# Patient Record
Sex: Male | Born: 1970 | Hispanic: No | Smoking: Former smoker
Health system: Southern US, Community
[De-identification: ages and names within clinical notes are randomized; demographics above are authoritative.]

## PROBLEM LIST (undated history)

## (undated) DIAGNOSIS — A150 Tuberculosis of lung: Secondary | ICD-10-CM

## (undated) DIAGNOSIS — K759 Inflammatory liver disease, unspecified: Secondary | ICD-10-CM

## (undated) DIAGNOSIS — A630 Anogenital (venereal) warts: Secondary | ICD-10-CM

## (undated) DIAGNOSIS — B20 Human immunodeficiency virus [HIV] disease: Secondary | ICD-10-CM

## (undated) DIAGNOSIS — R7881 Bacteremia: Secondary | ICD-10-CM

## (undated) DIAGNOSIS — B9561 Methicillin susceptible Staphylococcus aureus infection as the cause of diseases classified elsewhere: Secondary | ICD-10-CM

## (undated) DIAGNOSIS — J471 Bronchiectasis with (acute) exacerbation: Secondary | ICD-10-CM

## (undated) DIAGNOSIS — F32A Depression, unspecified: Secondary | ICD-10-CM

## (undated) DIAGNOSIS — B59 Pneumocystosis: Secondary | ICD-10-CM

## (undated) DIAGNOSIS — E119 Type 2 diabetes mellitus without complications: Secondary | ICD-10-CM

## (undated) DIAGNOSIS — R0600 Dyspnea, unspecified: Secondary | ICD-10-CM

## (undated) DIAGNOSIS — F329 Major depressive disorder, single episode, unspecified: Secondary | ICD-10-CM

## (undated) HISTORY — PX: FRACTURE SURGERY: SHX138

## (undated) HISTORY — DX: Anogenital (venereal) warts: A63.0

## (undated) HISTORY — DX: Tuberculosis of lung: A15.0

## (undated) HISTORY — DX: Human immunodeficiency virus (HIV) disease: B20

---

## 2013-09-18 ENCOUNTER — Encounter: Payer: Self-pay | Admitting: Family Medicine

## 2013-09-18 ENCOUNTER — Ambulatory Visit (INDEPENDENT_AMBULATORY_CARE_PROVIDER_SITE_OTHER): Payer: Medicaid Other | Admitting: Family Medicine

## 2013-09-18 VITALS — BP 110/74 | HR 105 | Temp 97.6°F | Ht 74.0 in | Wt 133.8 lb

## 2013-09-18 DIAGNOSIS — R059 Cough, unspecified: Secondary | ICD-10-CM

## 2013-09-18 DIAGNOSIS — B20 Human immunodeficiency virus [HIV] disease: Secondary | ICD-10-CM | POA: Insufficient documentation

## 2013-09-18 DIAGNOSIS — R05 Cough: Secondary | ICD-10-CM

## 2013-09-18 DIAGNOSIS — Z0289 Encounter for other administrative examinations: Secondary | ICD-10-CM | POA: Insufficient documentation

## 2013-09-18 MED ORDER — EFAVIRENZ-EMTRICITAB-TENOFOVIR 600-200-300 MG PO TABS: 1.0000 | ORAL_TABLET | Freq: Every day | ORAL | Status: DC

## 2013-09-18 NOTE — Assessment & Plan Note (Addendum)
Dr. Gwendolyn GrantWalden spoke with Dr. Algis LimingVanDam by phone, who recommended changing medication to Atripla. Will rx 30 day supply as pt is about to run out of his HIV medications. Referral entered to ID clinic for pt [facility]. Will check CD4 count and viral load (with reflex genotype) today.

## 2013-09-18 NOTE — Assessment & Plan Note (Signed)
Decreased lung sounds LLL in setting of productive cough. Would like to obtain CXR but pt does not have Medicaid set up yet. Afebrile at present. Will defer CXR to future visit. F/u in 3 weeks.

## 2013-09-18 NOTE — Progress Notes (Signed)
Patient ID: Anthony SchlatterJean Paul Philippe Parsons, male   DOB: 01/13/1971, 43 y.o.   MRN: [id number]  [facility]  [name] interpreter utilized during this clinic.   HPI:  Patient presents today for a new patient appointment to establish general primary care.  Has had a cough which produces yellow sputum. No fevers. Has lost weight over the last several years. He attributes this to his HIV medications as he feels weak with them and has decreased appetite. No fever. No hemoptysis. Denies diarrhea.  ROS: See HPI. Also complains of difficulty seeing in sunlight, loose teeth.  Immigrant History: -[place]rived in [place]: September 05, [place]  -Language: [name]   -Requires [name] Administratorintepreter (speaks essentially no [place]) -Education: no formal education, previously worked as a Hospital doctordriver -Other: [facility], spent 4 years in refugee camp in [place].  -Contact: family phone number [phone], caseworker is Stephens NovemberFa Ri Dar Bi [phone] (with [employer])  Preventative Care History: -Seen at health department?: no  Past Medical Hx:  -HIV disease, currently on three medications for this and is about to run out of medicines -CXR with infiltrate but negative TB test prior to leaving [place] per refugee health records  Past Surgical Hx:  -no prior surgeries -hx of RLQ abdominal [facility] in [facility], no surgery for this  Family Hx: updated in Epic  Social Hx:  -lives with daughter and three sons -denies alcohol, drug, tobacco abuse -speaks [name] -no formal education  PHYSICAL EXAM: BP 110/74  Pulse 105  Temp(Src) 97.6 F (36.4 C) (Oral)  Ht 6\' 2"  (1.88 m)  Wt 133 lb 12.8 oz (60.691 kg)  BMI 17.17 kg/m2 Gen: NAD, pleasant, cooperative, cachectic in appearance.  Thin and chronically ill-appearing.  HEENT: NCAT, MMM, no oropharyngeal exudates, poor dentition, no conjunctival pallor. Temporal wasting present. Heart: RRR, no murmurs Lungs: NWOB, decreased breath sounds in LLL  area, some dullness to percussion Abdomen: soft, nontender nondistended, no masses or organomegaly Neuro: grossly nonfocal, speech normal, PERRL Ext: No appreciable lower extremity edema bilaterally   ASSESSMENT/PLAN:  See problem based charting for assessment/plan.  FOLLOW UP: F/u in 3 weeks for continued refugee health care.  GrenadaBrittany J. Pollie MeyerMcIntyre, MD [facility]

## 2013-09-18 NOTE — Assessment & Plan Note (Signed)
Has not yet received refugee labs or vaccinations. Medicaid is pending approval. Will have pt return in 3 weeks once Medicaid is set up and will address refugee labs & vaccines at that visit.

## 2013-09-19 LAB — T-HELPER CELLS (CD4) COUNT (NOT AT ARMC)
Absolute CD4: 26 /uL — ABNORMAL LOW (ref 381–1469)
CD4 T Helper %: 3 % — ABNORMAL LOW (ref 32–62)
Total Lymphocyte: 35 % (ref 12–46)
Total lymphocyte count: 875 /uL (ref 700–3300)
WBC, lymph enumeration: 2.5 10*3/uL — ABNORMAL LOW (ref 4.0–10.5)

## 2013-09-19 LAB — HIV-1 RNA ULTRAQUANT REFLEX TO GENTYP+
HIV 1 RNA Quant: 148615 copies/mL — ABNORMAL HIGH (ref <20)
HIV-1 RNA Quant, Log: 5.17 {Log} — ABNORMAL HIGH (ref <1.30)

## 2013-09-26 ENCOUNTER — Encounter: Payer: Self-pay | Admitting: Internal Medicine

## 2013-09-26 ENCOUNTER — Encounter: Payer: Self-pay | Admitting: Family Medicine

## 2013-09-26 ENCOUNTER — Ambulatory Visit: Payer: No Typology Code available for payment source | Admitting: Internal Medicine

## 2013-09-26 ENCOUNTER — Telehealth: Payer: Self-pay | Admitting: Family Medicine

## 2013-09-26 ENCOUNTER — Ambulatory Visit
Admission: RE | Admit: 2013-09-26 | Discharge: 2013-09-26 | Disposition: A | Discharge status: Home / Self Care | Payer: Medicaid Other | Source: Ambulatory Visit | Attending: Family Medicine | Admitting: Family Medicine

## 2013-09-26 ENCOUNTER — Ambulatory Visit (INDEPENDENT_AMBULATORY_CARE_PROVIDER_SITE_OTHER): Payer: Medicaid Other | Admitting: Family Medicine

## 2013-09-26 ENCOUNTER — Other Ambulatory Visit: Payer: Self-pay | Admitting: *Deleted

## 2013-09-26 ENCOUNTER — Ambulatory Visit (INDEPENDENT_AMBULATORY_CARE_PROVIDER_SITE_OTHER): Payer: Medicaid Other | Admitting: Internal Medicine

## 2013-09-26 VITALS — BP 109/71 | HR 119 | Temp 98.5°F | Ht 74.0 in | Wt 133.0 lb

## 2013-09-26 VITALS — BP 101/70 | HR 111 | Temp 98.3°F | Ht 74.0 in | Wt 133.0 lb

## 2013-09-26 DIAGNOSIS — J189 Pneumonia, unspecified organism: Secondary | ICD-10-CM | POA: Diagnosis not present

## 2013-09-26 DIAGNOSIS — R059 Cough, unspecified: Secondary | ICD-10-CM

## 2013-09-26 DIAGNOSIS — B20 Human immunodeficiency virus [HIV] disease: Secondary | ICD-10-CM | POA: Diagnosis not present

## 2013-09-26 DIAGNOSIS — R05 Cough: Secondary | ICD-10-CM

## 2013-09-26 DIAGNOSIS — Z0289 Encounter for other administrative examinations: Secondary | ICD-10-CM

## 2013-09-26 LAB — CBC
HEMATOCRIT: 33.2 % — AB (ref 39.0–52.0)
Hemoglobin: 11.5 g/dL — ABNORMAL LOW (ref 13.0–17.0)
MCH: 33.9 pg (ref 26.0–34.0)
MCHC: 34.6 g/dL (ref 30.0–36.0)
MCV: 97.9 fL (ref 78.0–100.0)
Platelets: 188 10*3/uL (ref 150–400)
RBC: 3.39 MIL/uL — ABNORMAL LOW (ref 4.22–5.81)
RDW: 12.2 % (ref 11.5–15.5)
WBC: 5.2 10*3/uL (ref 4.0–10.5)

## 2013-09-26 MED ORDER — LEVOFLOXACIN 750 MG PO TABS: 750.0000 mg | ORAL_TABLET | Freq: Every day | ORAL | Status: DC

## 2013-09-26 MED ORDER — SULFAMETHOXAZOLE-TRIMETHOPRIM 400-80 MG PO TABS: 1.0000 | ORAL_TABLET | Freq: Every day | ORAL | Status: DC

## 2013-09-26 MED ORDER — SULFAMETHOXAZOLE-TMP DS 800-160 MG PO TABS: 1.0000 | ORAL_TABLET | Freq: Three times a day (TID) | ORAL | Status: DC

## 2013-09-26 MED ORDER — SULFAMETHOXAZOLE-TRIMETHOPRIM 400-80 MG PO TABS
1.0000 | ORAL_TABLET | Freq: Once | ORAL | Status: DC
Start: 2013-09-26 — End: 2013-09-26

## 2013-09-26 MED ORDER — AZITHROMYCIN 600 MG PO TABS: 1200.0000 mg | ORAL_TABLET | ORAL | Status: DC

## 2013-09-26 MED ORDER — ELVITEG-COBIC-EMTRICIT-TENOFDF 150-150-200-300 MG PO TABS: 1.0000 | ORAL_TABLET | Freq: Every day | ORAL | Status: DC

## 2013-09-26 MED ORDER — SULFAMETHOXAZOLE-TMP DS 800-160 MG PO TABS: 1.0000 | ORAL_TABLET | Freq: Every day | ORAL | Status: DC

## 2013-09-26 NOTE — Telephone Encounter (Signed)
Called case manager to give report of CXR.  Looking at images, definitely does have some opacities on Left lung.  Also with small pleural effusion on that side.  Radiologist's read concerning for upper bronchial disease, possibly TB.  Quantiferon Gold pending.    Seen at ID clinic today, already started on Levaquin and Bactrim DS TID, trending back down to 1 daily for PPX.    Far Ri (Sports coachcase manager) not able to talk call, will return call this PM.

## 2013-09-26 NOTE — Patient Instructions (Addendum)
Go today to get your chest xray.  I will call with the results this afternoon.    [facility] [address] [facility] [address].  I spoke with Dr. Drue SecondSnider who will get you set up with a coupon for medications.   Take the Bactrim daily.  Take the Azithromycin one pill a week.    It was good to see you again!  I would like to see you back in about 2 weeks to make sure things are going well.  Come back sooner if this cough gets worse.

## 2013-09-26 NOTE — Progress Notes (Signed)
   Subjective:    Patient ID: Vonzell SchlatterJean Paul Philippe [place]field, male    DOB: 11/01/1970, 43 y.o.   MRN: [id number]  HPI jean Renae Ficklepaul phillippe [place] but then spent time in refugee camp [place]. Dx with mTB and HIV in 2012, finished mTB treatment and also started on on tdf/3tc/efv nad septra. Dx 2012. Has dry cough.  Hiv, cd 4 count of 26%, VL 148,615 genotype pending. He states that he ran out of his hiv meds within the last week. Here with assistant, but does not speak swahili.  Used interpreter phone to translate in swahili  Current Outpatient Prescriptions on File Prior to Visit  Medication Sig Dispense Refill  . azithromycin (ZITHROMAX) 600 MG tablet Take 2 tablets (1,200 mg total) by mouth once a week.  24 tablet  2  . elvitegravir-cobicistat-emtricitabine-tenofovir (STRIBILD) 150-150-200-300 MG TABS tablet Take 1 tablet by mouth daily with breakfast.  30 tablet  5   No current facility-administered medications on file prior to visit.   Active Ambulatory Problems    Diagnosis Date Noted  . HIV disease 09/18/2013  . Refugee health examination 09/18/2013  . Cough 09/18/2013  . CAP (community acquired pneumonia) 09/27/2013   Resolved Ambulatory Problems    Diagnosis Date Noted  . No Resolved Ambulatory Problems   Past Medical History  Diagnosis Date  . TB (pulmonary tuberculosis)      Review of Systems + weight loss, dry cough. Other 10 point ros is negative    Objective:   Physical Exam .BP 101/70  Pulse 111  Temp(Src) 98.3 F (36.8 C) (Oral)  Ht 6\' 2"  (1.88 m)  Wt 133 lb (60.328 kg)  BMI 17.07 kg/m2 Physical Exam  Constitutional: He is oriented to person, place, and time. He appears thin, cachetic and under-nourished. No distress.  HENT:  Mouth/Throat: Oropharynx is clear and moist. No oropharyngeal exudate.  Cardiovascular: Normal rate, regular rhythm and normal heart sounds. Exam reveals no gallop and no friction rub.  No murmur heard.  Pulmonary/Chest: Effort  normal and breath sounds normal. No respiratory distress. He has no wheezes.  Abdominal: Soft. Bowel sounds are normal. He exhibits no distension. There is no tenderness.  Lymphadenopathy:  He has no cervical adenopathy.  Neurological: He is alert and oriented to person, place, and time.  Skin: Skin is warm and dry. No rash noted. No erythema.  Psychiatric: He has a normal mood and affect. His behavior is normal.         Assessment & Plan:   hiv = poorly controlled. 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 Give him co-pay card and start adap application  Presumed pneumonia = will treat for cap plus presumed pcp, will do levo QD x 5 day and bactrim TID x 14 day, then down to 1 tab daily  oi proph = will be on bactrim and azithromycin  - did his paperwork to start ADAP, using 30 day co apy card. Coming back next week.

## 2013-09-27 DIAGNOSIS — J189 Pneumonia, unspecified organism: Secondary | ICD-10-CM | POA: Insufficient documentation

## 2013-09-27 LAB — COMPREHENSIVE METABOLIC PANEL
ALT: 12 U/L (ref 0–53)
AST: 22 U/L (ref 0–37)
Albumin: 3.4 g/dL — ABNORMAL LOW (ref 3.5–5.2)
Alkaline Phosphatase: 94 U/L (ref 39–117)
BILIRUBIN TOTAL: 0.2 mg/dL (ref 0.2–1.2)
BUN: 20 mg/dL (ref 6–23)
CALCIUM: 9 mg/dL (ref 8.4–10.5)
CHLORIDE: 100 meq/L (ref 96–112)
CO2: 26 meq/L (ref 19–32)
CREATININE: 1.01 mg/dL (ref 0.50–1.35)
Glucose, Bld: 111 mg/dL — ABNORMAL HIGH (ref 70–99)
Potassium: 4.7 mEq/L (ref 3.5–5.3)
Sodium: 134 mEq/L — ABNORMAL LOW (ref 135–145)
Total Protein: 9.9 g/dL — ABNORMAL HIGH (ref 6.0–8.3)

## 2013-09-27 LAB — HEPATITIS B CORE ANTIBODY, TOTAL: Hep B Core Total Ab: REACTIVE — AB

## 2013-09-27 LAB — SICKLE CELL SCREEN: SICKLE CELL SCREEN: POSITIVE — AB

## 2013-09-27 LAB — CRYPTOCOCCAL ANTIGEN: Crypto Ag: NEGATIVE

## 2013-09-27 LAB — VARICELLA ZOSTER ANTIBODY, IGG: VARICELLA IGG: 542.8 {index} — AB (ref <135.00)

## 2013-09-27 LAB — HEPATITIS C ANTIBODY: HCV AB: NEGATIVE

## 2013-09-27 LAB — HEPATITIS B SURFACE ANTIGEN: Hepatitis B Surface Ag: NEGATIVE

## 2013-09-27 LAB — HEPATITIS A ANTIBODY, TOTAL: Hep A Total Ab: REACTIVE — AB

## 2013-09-27 LAB — RPR

## 2013-09-27 NOTE — Assessment & Plan Note (Signed)
After seeing results of lab work, called and spoke with Dr. Drue SecondSnider ID consultant. Very helpful conversation and VERY appreciative of recommendations. Recommended to add Hep A, Hep B core Ab, Hep C, crypto, toxo titers to labwork.   Patient to leave here immediately and go be seen at ID Clinic.  Will obtain help with obtaining ARVs.   Will go ahead and perform usual refugee screening labs as per health dept today.  Medicaid will cover this when it kicks in.

## 2013-09-27 NOTE — Assessment & Plan Note (Signed)
Presumed. Sent for CXR. Started on Levaquin plus Bactrim DS x 14 days Could not rule out TB.  Quantiferon gold pending.

## 2013-09-27 NOTE — Progress Notes (Signed)
Subjective:    [name] is a 43 y.o. male who [facility] refugee clinic:  1.  HIV:  Seen here last Monday to establish care.  History of HIV.  CD4 plus viral load and genotype sent.  Viral load >148K and CD4 count 26.  Has finished his antiretrovirals last week.  Also no further septra.  Could not afford Atripla b/c no Medicaid currently.    Cough persists.  Dry hacking.  No hemoptysis.  Gradual weight loss over time.  He denies any headaches, changes in vision, night sweats.  No diarrhea.    ROS as above per HPI, otherwise neg.    The following portions of the patient's history were reviewed and updated as appropriate: allergies, current medications, past medical history, family and social history, and problem list. Patient is a nonsmoker.    PMH reviewed.  Past Medical History  Diagnosis Date  . HIV disease   . TB (pulmonary tuberculosis)     previously treated according to refugee documentation   History reviewed. No pertinent past surgical history.  Medications reviewed. Current Outpatient Prescriptions  Medication Sig Dispense Refill  . azithromycin (ZITHROMAX) 600 MG tablet Take 2 tablets (1,200 mg total) by mouth once a week.  24 tablet  2  . elvitegravir-cobicistat-emtricitabine-tenofovir (STRIBILD) 150-150-200-300 MG TABS tablet Take 1 tablet by mouth daily with breakfast.  30 tablet  5  . levofloxacin (LEVAQUIN) 750 MG tablet Take 1 tablet (750 mg total) by mouth daily.  5 tablet  0  . sulfamethoxazole-trimethoprim (BACTRIM DS) 800-160 MG per tablet Take 1 tablet by mouth 3 (three) times daily. For 14 days. Then 1 tablet daily.  42 tablet  0  . sulfamethoxazole-trimethoprim (BACTRIM DS) 800-160 MG per tablet Take 1 tablet by mouth daily.  30 tablet  5   No current facility-administered medications for this visit.     Objective:   Physical Exam BP 109/71  Pulse 119  Temp(Src) 98.5 F (36.9 C) (Oral)  Ht 6\' 2"  (1.88 m)  Wt 133  lb (60.328 kg)  BMI 17.07 kg/m2 Gen:  Alert, cooperative patient who appears stated age in no acute distress.  Vital signs reviewed.  Cachectic, temporal wasting noted.   HEENT: EOMI,  MMM Cardiac:  Regular rate and rhythm . Pulm:  Right lung sounds good.  Left lung sounds worse than prior exam -- dullness to percussion and decreased BS on Left.  Crackles that extend to mid-lung on Left.

## 2013-09-27 NOTE — Assessment & Plan Note (Signed)
See CAP above.

## 2013-09-28 LAB — TOXOPLASMA GONDII ANTIBODY, IGM: Toxoplasma Antibody- IgM: <3 IU/mL (ref <8.0)

## 2013-09-28 LAB — QUANTIFERON TB GOLD ASSAY (BLOOD)
Mitogen value: 0.32 IU/mL
QUANTIFERON NIL VALUE: 0.02 [IU]/mL
Quantiferon Tb Ag Minus Nil Value: 0 IU/mL
TB AG VALUE: 0.02 [IU]/mL

## 2013-09-29 ENCOUNTER — Encounter: Payer: Self-pay | Admitting: Family Medicine

## 2013-09-30 LAB — HIV-1 GENOTYPR PLUS

## 2013-10-03 ENCOUNTER — Other Ambulatory Visit: Payer: Self-pay | Admitting: *Deleted

## 2013-10-03 ENCOUNTER — Ambulatory Visit: Payer: No Typology Code available for payment source

## 2013-10-03 ENCOUNTER — Ambulatory Visit
Admission: RE | Admit: 2013-10-03 | Discharge: 2013-10-03 | Disposition: A | Discharge status: Home / Self Care | Payer: Medicaid Other | Source: Ambulatory Visit | Attending: Infectious Disease | Admitting: Infectious Disease

## 2013-10-03 ENCOUNTER — Ambulatory Visit (INDEPENDENT_AMBULATORY_CARE_PROVIDER_SITE_OTHER): Payer: Medicaid Other | Admitting: Internal Medicine

## 2013-10-03 ENCOUNTER — Other Ambulatory Visit: Payer: Self-pay | Admitting: Infectious Disease

## 2013-10-03 VITALS — BP 101/67 | HR 97 | Temp 97.7°F | Wt 134.0 lb

## 2013-10-03 DIAGNOSIS — J189 Pneumonia, unspecified organism: Secondary | ICD-10-CM

## 2013-10-03 DIAGNOSIS — B20 Human immunodeficiency virus [HIV] disease: Secondary | ICD-10-CM

## 2013-10-03 DIAGNOSIS — Z0289 Encounter for other administrative examinations: Secondary | ICD-10-CM

## 2013-10-03 MED ORDER — ELVITEG-COBIC-EMTRICIT-TENOFDF 150-150-200-300 MG PO TABS: 1.0000 | ORAL_TABLET | Freq: Every day | ORAL | Status: DC
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
## 2013-10-03 NOTE — Telephone Encounter (Signed)
ADAP Application 

## 2013-10-09 ENCOUNTER — Ambulatory Visit (INDEPENDENT_AMBULATORY_CARE_PROVIDER_SITE_OTHER): Payer: Medicaid Other | Admitting: Family Medicine

## 2013-10-09 VITALS — BP 98/64 | HR 97 | Temp 98.3°F | Ht 74.0 in | Wt 142.0 lb

## 2013-10-09 DIAGNOSIS — Z23 Encounter for immunization: Secondary | ICD-10-CM

## 2013-10-09 DIAGNOSIS — M545 Low back pain, unspecified: Secondary | ICD-10-CM

## 2013-10-09 DIAGNOSIS — B20 Human immunodeficiency virus [HIV] disease: Secondary | ICD-10-CM

## 2013-10-09 DIAGNOSIS — J189 Pneumonia, unspecified organism: Secondary | ICD-10-CM | POA: Diagnosis not present

## 2013-10-09 MED ORDER — ACETAMINOPHEN 325 MG PO TABS: 650.0000 mg | ORAL_TABLET | Freq: Four times a day (QID) | ORAL | Status: DC | PRN

## 2013-10-09 MED ORDER — CYCLOBENZAPRINE HCL 5 MG PO TABS: 5.0000 mg | ORAL_TABLET | Freq: Three times a day (TID) | ORAL | Status: DC | PRN

## 2013-10-09 NOTE — Patient Instructions (Signed)
Take tylenol and flexeril as needed for pain. See dental handout.  Be well, Dr. Pollie MeyerMcIntyre & Dr. Gwendolyn GrantWalden

## 2013-10-10 DIAGNOSIS — M545 Low back pain, unspecified: Secondary | ICD-10-CM | POA: Insufficient documentation
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
## 2013-10-10 NOTE — Assessment & Plan Note (Signed)
Much improved after levaquin therapy.
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
## 2013-10-11 NOTE — Assessment & Plan Note (Addendum)
Following with ID Clinic - greatly appreciate input. Now on antiretrovirals and once daily Bactrim.   Looks much better on exam today than previously.   Has gained 9 lbs

## 2013-10-18 ENCOUNTER — Telehealth: Payer: Self-pay | Admitting: *Deleted

## 2013-10-18 NOTE — Telephone Encounter (Signed)
Irving Burtonmily [facility] called in response to [facility] calling her in reference to the patient. She advised the patient last 3 sputum cultures came back negative and they are not treating him for TB. Advised I will let the doctor know and if we have any other questions we will call her back.

## 2013-10-25 ENCOUNTER — Encounter: Payer: Self-pay | Admitting: *Deleted

## 2013-10-26 ENCOUNTER — Ambulatory Visit (INDEPENDENT_AMBULATORY_CARE_PROVIDER_SITE_OTHER): Payer: Medicaid Other | Admitting: Internal Medicine

## 2013-10-26 ENCOUNTER — Encounter: Payer: Self-pay | Admitting: *Deleted

## 2013-10-26 ENCOUNTER — Encounter: Payer: Self-pay | Admitting: Internal Medicine

## 2013-10-26 VITALS — BP 122/78 | HR 84 | Temp 98.2°F | Wt 144.0 lb

## 2013-10-26 DIAGNOSIS — Z21 Asymptomatic human immunodeficiency virus [HIV] infection status: Secondary | ICD-10-CM

## 2013-10-26 LAB — COMPLETE METABOLIC PANEL WITH GFR
ALT: 35 U/L (ref 0–53)
AST: 36 U/L (ref 0–37)
Albumin: 3.7 g/dL (ref 3.5–5.2)
Alkaline Phosphatase: 92 U/L (ref 39–117)
BUN: 20 mg/dL (ref 6–23)
CALCIUM: 8.9 mg/dL (ref 8.4–10.5)
CHLORIDE: 104 meq/L (ref 96–112)
CO2: 24 mEq/L (ref 19–32)
CREATININE: 1.11 mg/dL (ref 0.50–1.35)
GFR, Est African American: >89 mL/min
GFR, Est Non African American: 81 mL/min
Glucose, Bld: 89 mg/dL (ref 70–99)
Potassium: 4.5 mEq/L (ref 3.5–5.3)
Sodium: 135 mEq/L (ref 135–145)
Total Bilirubin: 0.3 mg/dL (ref 0.2–1.2)
Total Protein: 9.5 g/dL — ABNORMAL HIGH (ref 6.0–8.3)

## 2013-10-26 LAB — CBC WITH DIFFERENTIAL/PLATELET
Basophils Absolute: 0 10*3/uL (ref 0.0–0.1)
Basophils Relative: 1 % (ref 0–1)
EOS ABS: 0.1 10*3/uL (ref 0.0–0.7)
Eosinophils Relative: 4 % (ref 0–5)
HEMATOCRIT: 33.8 % — AB (ref 39.0–52.0)
Hemoglobin: 11.7 g/dL — ABNORMAL LOW (ref 13.0–17.0)
Lymphocytes Relative: 44 % (ref 12–46)
Lymphs Abs: 1.2 10*3/uL (ref 0.7–4.0)
MCH: 34.4 pg — ABNORMAL HIGH (ref 26.0–34.0)
MCHC: 34.6 g/dL (ref 30.0–36.0)
MCV: 99.4 fL (ref 78.0–100.0)
MONO ABS: 0.3 10*3/uL (ref 0.1–1.0)
Monocytes Relative: 11 % (ref 3–12)
Neutro Abs: 1.1 10*3/uL — ABNORMAL LOW (ref 1.7–7.7)
Neutrophils Relative %: 40 % — ABNORMAL LOW (ref 43–77)
Platelets: 141 10*3/uL — ABNORMAL LOW (ref 150–400)
RBC: 3.4 MIL/uL — ABNORMAL LOW (ref 4.22–5.81)
RDW: 14.7 % (ref 11.5–15.5)
WBC: 2.8 10*3/uL — ABNORMAL LOW (ref 4.0–10.5)

## 2013-10-26 MED ORDER — SULFAMETHOXAZOLE-TRIMETHOPRIM 400-80 MG PO TABS: 1.0000 | ORAL_TABLET | Freq: Every day | ORAL | Status: DC

## 2013-10-26 NOTE — Progress Notes (Signed)
Patient ID: Anthony SchlatterJean Paul Philippe Parsons, male   DOB: 03/20/1970, 43 y.o.   MRN: [id number]       Patient ID: Anthony SchlatterJean Paul Philippe Rayman, male   DOB: 07/19/1970, 43 y.o.   MRN: [id number]  HPI   43yo M with HIV disease, CD 4 count of 24/VL 148,615 now on stribild. [name] speaking male Presumably had PCP pneumonia vs. CAP. He had finished treatment. He has refilled his 2nd bottle of stribild using the copay card but adap is still pending. However, after inquiry, it appears that the patient has medicaid as he is unaware of it.  He reports taking stribild daily without missing doses. He has also finished his antibiotics for cap/pcp. He states that he is starting to feel better. He has gained 10 kg since being in the [place]. He states that his 3 children are starting to work - they are 42 yo, 42 yo, and 42 yo. He is disappointed that they have encouraged his youngest to work rather than go to school.  Outpatient Encounter Prescriptions as of 10/26/2013  Medication Sig  . acetaminophen (TYLENOL) 325 MG tablet Take 2 tablets (650 mg total) by mouth every 6 (six) hours as needed for mild pain.  Marland Kitchen. azithromycin (ZITHROMAX) 600 MG tablet Take 2 tablets (1,200 mg total) by mouth once a week.  . cyclobenzaprine (FLEXERIL) 5 MG tablet Take 1 tablet (5 mg total) by mouth 3 (three) times daily as needed for muscle spasms.  Marland Kitchen. elvitegravir-cobicistat-emtricitabine-tenofovir (STRIBILD) 150-150-200-300 MG TABS tablet Take 1 tablet by mouth daily with breakfast.  . sulfamethoxazole-trimethoprim (BACTRIM DS) 800-160 MG per tablet Take 1 tablet by mouth 3 (three) times daily. For 14 days. Then 1 tablet daily.  Marland Kitchen. sulfamethoxazole-trimethoprim (BACTRIM DS) 800-160 MG per tablet Take 1 tablet by mouth daily.     Patient Active Problem List   Diagnosis Date Noted  . Low back pain 10/10/2013  . CAP (community acquired pneumonia) 09/27/2013  . HIV disease 09/18/2013  . Refugee health examination 09/18/2013  . Cough 09/18/2013      Health Maintenance Due  Topic Date Due  . Tetanus/tdap  03/16/1990  . Influenza Vaccine  10/14/2013     Review of Systems 10 point ros is negative  Physical Exam   BP 122/78  Pulse 84  Temp(Src) 98.2 F (36.8 C) (Oral)  Wt 144 lb (65.318 kg) Physical Exam  Constitutional: He is oriented to person, place, and time. He appears well-developed and well-nourished. No distress. Thin male, cachetic HENT:  Mouth/Throat: Oropharynx is clear and moist. No oropharyngeal exudate.  Cardiovascular: Normal rate, regular rhythm and normal heart sounds. Exam reveals no gallop and no friction rub.  No murmur heard.  Pulmonary/Chest: Effort normal and breath sounds normal. No respiratory distress. He has no wheezes.  Abdominal: Soft. Bowel sounds are normal. He exhibits no distension. There is no tenderness.  Lymphadenopathy:  He has no cervical adenopathy.  Neurological: He is alert and oriented to person, place, and time.  Skin: Skin is warm and dry. No rash noted. No erythema.  Psychiatric: He has a normal mood and affect. His behavior is normal.    No results found for this basename: CD4TCELL   No results found for this basename: CD4TABS   Lab Results  Component Value Date   HIV1RNAQUANT [id number]* 09/18/2013   No results found for this basename: HEPBSAB   No results found for this basename: RPR    CBC Lab Results  Component Value Date   WBC  5.2 09/26/2013   RBC 3.39* 09/26/2013   HGB 11.5* 09/26/2013   HCT 33.2* 09/26/2013   PLT 188 09/26/2013   MCV 97.9 09/26/2013   MCH 33.9 09/26/2013   MCHC 34.6 09/26/2013   RDW 12.2 09/26/2013   BMET Lab Results  Component Value Date   NA 134* 09/26/2013   K 4.7 09/26/2013   CL 100 09/26/2013   CO2 26 09/26/2013   GLUCOSE 111* 09/26/2013   BUN 20 09/26/2013   CREATININE 1.01 09/26/2013   CALCIUM 9.0 09/26/2013     Assessment and Plan    hiv = just started on stribild 4 wks ago. we will check his cd 4 count and viral load  Return  to work = patient still feeling weak to do work that is has physical exertion. Agree to provide note for 3 months   Anorexia = starting to improve has had 10 kg weight gain  oi proph = he is not yet on oi proph of bactrim or azithromycin. We will attempt to call him to start taking stribild. We will have him come back in 2 wk to review that he has started the medications accordingly. We will review with translator  Health maintenance = to have hep b #2 in 2 wk as well as influenza  rtc in 2 wk

## 2013-10-27 LAB — T-HELPER CELL (CD4) - (RCID CLINIC ONLY)
CD4 T CELL ABS: 68 /uL — AB (ref 400–2700)
CD4 T CELL HELPER: 6 % — AB (ref 33–55)

## 2013-10-30 ENCOUNTER — Encounter: Payer: Self-pay | Admitting: *Deleted

## 2013-10-30 ENCOUNTER — Telehealth: Payer: Self-pay | Admitting: *Deleted

## 2013-10-30 LAB — HIV-1 RNA QUANT-NO REFLEX-BLD
HIV 1 RNA Quant: 1267 copies/mL — ABNORMAL HIGH (ref <20)
HIV-1 RNA Quant, Log: 3.1 {Log} — ABNORMAL HIGH (ref <1.30)

## 2013-10-30 NOTE — Telephone Encounter (Signed)
Sent the patient a letter as we can not call him. His phone does not take incoming calls and he does not speak english. If the patient calls in we need the name of the pharmacy he uses and to let him know he has Medicaid his number is [id number] R. He does not know this as it was applied for on his behalf some time ago.

## 2013-11-13 ENCOUNTER — Encounter: Payer: Self-pay | Admitting: Internal Medicine

## 2013-11-13 ENCOUNTER — Ambulatory Visit (INDEPENDENT_AMBULATORY_CARE_PROVIDER_SITE_OTHER): Payer: Medicaid Other | Admitting: Internal Medicine

## 2013-11-13 VITALS — BP 121/74 | HR 87 | Temp 97.2°F | Wt 153.0 lb

## 2013-11-13 DIAGNOSIS — Z23 Encounter for immunization: Secondary | ICD-10-CM

## 2013-11-13 DIAGNOSIS — B20 Human immunodeficiency virus [HIV] disease: Secondary | ICD-10-CM

## 2013-11-13 MED ORDER — DOLUTEGRAVIR SODIUM 50 MG PO TABS: 50.0000 mg | ORAL_TABLET | Freq: Every day | ORAL | Status: DC

## 2013-11-13 MED ORDER — DARUNAVIR-COBICISTAT 800-150 MG PO TABS: 1.0000 | ORAL_TABLET | Freq: Every day | ORAL | Status: DC

## 2013-11-13 MED ORDER — AZITHROMYCIN 600 MG PO TABS: 1200.0000 mg | ORAL_TABLET | ORAL | Status: DC

## 2013-11-13 MED ORDER — ZIDOVUDINE 300 MG PO TABS: 300.0000 mg | ORAL_TABLET | Freq: Two times a day (BID) | ORAL | Status: DC

## 2013-11-13 NOTE — Progress Notes (Signed)
Patient ID: [name], male   DOB: [birth year], 43 y.o.   MRN: [id number]       Patient ID: [name], male   DOB: 1970/08/02, 43 y.o.   MRN: [id number]  HPI  43yo M with advanced HIV/AIDS who recently emigrated to [place], He was diagnosed in setting of mTB plus HIV. He received treamtent for mTB and treated with tdf/3tc/efv nad septra. 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 CD 4 count of 68/VL 150K. He was empirically started on stribild while genotype pending. Now it has returned showing that he is resistant to nnrti with but does not have any PI mutations: K65R, D67G, A98G, L100I, K103N, K103S, V108I, M184V, T215I, K219E, P225H   Repeat lab work in 8/13 showed CD 4 count of 68/VL 1267. He is doing well since taking stribild, continues to have weight gain. No cough or fever or dyspnea Outpatient Encounter Prescriptions as of 11/13/2013  Medication Sig  . acetaminophen (TYLENOL) 325 MG tablet Take 2 tablets (650 mg total) by mouth every 6 (six) hours as needed for mild pain.  . cyclobenzaprine (FLEXERIL) 5 MG tablet Take 1 tablet (5 mg total) by mouth 3 (three) times daily as needed for muscle spasms.  Marland Kitchen elvitegravir-cobicistat-emtricitabine-tenofovir (STRIBILD) 150-150-200-300 MG TABS tablet Take 1 tablet by mouth daily with breakfast.  . sulfamethoxazole-trimethoprim (BACTRIM) 400-80 MG per tablet Take 1 tablet by mouth daily.     Patient Active Problem List   Diagnosis Date Noted  . Low back pain 10/10/2013  . CAP (community acquired pneumonia) 09/27/2013  . HIV disease 09/18/2013  . Refugee health examination 09/18/2013  . Cough 09/18/2013     Health Maintenance Due  Topic Date Due  . Tetanus/tdap  03/16/1990  . Influenza Vaccine  10/14/2013     Review of Systems 10 point ros is negative  Physical Exam  BP 121/74  Pulse 87  Temp(Src) 97.2 F (36.2 C) (Oral)  Wt 153 lb (69.4 kg) Physical Exam  Constitutional: He is  oriented to person, place, and time. He appears well-developed and well-nourished. No distress.  HENT:  Mouth/Throat: Oropharynx is clear and moist. No oropharyngeal exudate.  Cardiovascular: Normal rate, regular rhythm and normal heart sounds. Exam reveals no gallop and no friction rub.  No murmur heard.  Pulmonary/Chest: Effort normal and breath sounds normal. No respiratory distress. He has no wheezes.  Abdominal: Soft. Bowel sounds are normal. He exhibits no distension. There is no tenderness.  Lymphadenopathy:  He has no cervical adenopathy.  Neurological: He is alert and oriented to person, place, and time.  Skin: Skin is warm and dry. No rash noted. No erythema.  Psychiatric: He has a normal mood and affect. His behavior is normal.     Lab Results  Component Value Date   CD4TCELL 6* 10/26/2013   Lab Results  Component Value Date   CD4TABS 68* 10/26/2013   Lab Results  Component Value Date   HIV1RNAQUANT 1267* 10/26/2013   No results found for this basename: HEPBSAB   No results found for this basename: RPR    CBC Lab Results  Component Value Date   WBC 2.8* 10/26/2013   RBC 3.40* 10/26/2013   HGB 11.7* 10/26/2013   HCT 33.8* 10/26/2013   PLT 141* 10/26/2013   MCV 99.4 10/26/2013   MCH 34.4* 10/26/2013   MCHC 34.6 10/26/2013   RDW 14.7 10/26/2013   LYMPHSABS 1.2 10/26/2013   MONOABS 0.3 10/26/2013  EOSABS 0.1 10/26/2013   BASOSABS 0.0 10/26/2013   BMET Lab Results  Component Value Date   NA 135 10/26/2013   K 4.5 10/26/2013   CL 104 10/26/2013   CO2 24 10/26/2013   GLUCOSE 89 10/26/2013   BUN 20 10/26/2013   CREATININE 1.11 10/26/2013   CALCIUM 8.9 10/26/2013   GFRNONAA 81 10/26/2013   GFRAA >89 10/26/2013     Assessment and Plan  hiv = poorly controlled. We will need to change his regimen in order to account for his genotype. We will change him to tivicay plus prezcobix (DRV-cobi) plus zidovudine  BID. He has low resistance to AZT, unclear if it will help but  really have no other susceptible NNRTI to add to backbone for DLG. We have reviewed his new regimen with patient and interpreter.  oi proph = will need to continue with bactrim daily plus azithromycin weekly. Likely to be on bactrim through the spring and azithromycin for 3 months  Health maintenance = getting flu and hep B #2 today  rtc in 4 wk to assess adherence and do blood work to see if he continues to have viral suppression, possibly virologic control.  Spent 30 min with patient with greater than 50% on adherence counseling

## 2013-11-13 NOTE — Addendum Note (Signed)
Addended by: Lurlean Leyden on: 11/13/2013 12:29 PM   Modules accepted: Orders

## 2013-12-11 ENCOUNTER — Ambulatory Visit (INDEPENDENT_AMBULATORY_CARE_PROVIDER_SITE_OTHER): Payer: Medicaid Other | Admitting: Internal Medicine

## 2013-12-11 ENCOUNTER — Encounter: Payer: Self-pay | Admitting: Internal Medicine

## 2013-12-11 VITALS — BP 127/76 | HR 84 | Temp 97.3°F | Wt 151.0 lb

## 2013-12-11 DIAGNOSIS — B20 Human immunodeficiency virus [HIV] disease: Secondary | ICD-10-CM

## 2013-12-11 DIAGNOSIS — J189 Pneumonia, unspecified organism: Secondary | ICD-10-CM

## 2013-12-11 MED ORDER — GUAIFENESIN-DM 100-10 MG/5ML PO SYRP: 5.0000 mL | ORAL_SOLUTION | ORAL | Status: DC | PRN

## 2013-12-11 MED ORDER — LEVOFLOXACIN 750 MG PO TABS: 750.0000 mg | ORAL_TABLET | Freq: Every day | ORAL | Status: DC

## 2013-12-11 NOTE — Progress Notes (Signed)
Patient ID: [name], male   DOB: 05/12/1970, 43 y.o.   MRN: [id number]       Patient ID: [name], male   DOB: 1970/11/27, 43 y.o.   MRN: [id number]  HPI  43yo M with HIV disease, CD 4 cont of 68/VL 1267 on tivicay-prezcobix-ZDV BID on bactrim and azithromycin oi proph. 5 days of productive cough. occ wheezing. occ chills. Unclear if he has fever. No sick contacts. Has gained weight since starting back on ART  Outpatient Encounter Prescriptions as of 12/11/2013  Medication Sig  . acetaminophen (TYLENOL) 325 MG tablet Take 2 tablets (650 mg total) by mouth every 6 (six) hours as needed for mild pain.  Marland Kitchen azithromycin (ZITHROMAX) 600 MG tablet Take 2 tablets (1,200 mg total) by mouth once a week.  . cyclobenzaprine (FLEXERIL) 5 MG tablet Take 1 tablet (5 mg total) by mouth 3 (three) times daily as needed for muscle spasms.  . darunavir-cobicistat (PREZCOBIX) 800-150 MG per tablet Take 1 tablet by mouth daily. Swallow whole. Do NOT crush, break or chew tablets. Take with food.  . dolutegravir (TIVICAY) 50 MG tablet Take 1 tablet (50 mg total) by mouth daily.  Marland Kitchen sulfamethoxazole-trimethoprim (BACTRIM) 400-80 MG per tablet Take 1 tablet by mouth daily.  . zidovudine (RETROVIR) 300 MG tablet Take 1 tablet (300 mg total) by mouth 2 (two) times daily.     Patient Active Problem List   Diagnosis Date Noted  . Low back pain 10/10/2013  . CAP (community acquired pneumonia) 09/27/2013  . HIV disease 09/18/2013  . Refugee health examination 09/18/2013  . Cough 09/18/2013     Health Maintenance Due  Topic Date Due  . Tetanus/tdap  03/16/1990     Review of Systems As mentioned above Physical Exam   BP 127/76  Pulse 84  Temp(Src) 97.3 F (36.3 C) (Oral)  Wt 151 lb (68.493 kg) Physical Exam  Constitutional: He is oriented to person, place, and time. He appears well-developed and well-nourished. No distress.  HENT:  Mouth/Throat: Oropharynx is clear and moist.  No oropharyngeal exudate.  Cardiovascular: Normal rate, regular rhythm and normal heart sounds. Exam reveals no gallop and no friction rub.  No murmur heard.  Pulmonary/Chest: Effort normal and breath sounds normal. No respiratory distress.  Wheezing on the right  Abdominal: Soft. Bowel sounds are normal. He exhibits no distension. There is no tenderness.  Lymphadenopathy:  He has no cervical adenopathy.  Neurological: He is alert and oriented to person, place, and time.  Skin: Skin is warm and dry. No rash noted. No erythema.  Psychiatric: He has a normal mood and affect. His behavior is normal.    Lab Results  Component Value Date   CD4TCELL 6* 10/26/2013   Lab Results  Component Value Date   CD4TABS 68* 10/26/2013   Lab Results  Component Value Date   HIV1RNAQUANT 1267* 10/26/2013   No results found for this basename: HEPBSAB   No results found for this basename: RPR    CBC Lab Results  Component Value Date   WBC 2.8* 10/26/2013   RBC 3.40* 10/26/2013   HGB 11.7* 10/26/2013   HCT 33.8* 10/26/2013   PLT 141* 10/26/2013   MCV 99.4 10/26/2013   MCH 34.4* 10/26/2013   MCHC 34.6 10/26/2013   RDW 14.7 10/26/2013   LYMPHSABS 1.2 10/26/2013   MONOABS 0.3 10/26/2013   EOSABS 0.1 10/26/2013   BASOSABS 0.0 10/26/2013   BMET Lab Results  Component Value Date  NA 135 10/26/2013   K 4.5 10/26/2013   CL 104 10/26/2013   CO2 24 10/26/2013   GLUCOSE 89 10/26/2013   BUN 20 10/26/2013   CREATININE 1.11 10/26/2013   CALCIUM 8.9 10/26/2013   GFRNONAA 81 10/26/2013   GFRAA >89 10/26/2013     Assessment and Plan   hiv = he is able to tell me hte medications he is taking them as directed. We will check cd 4 count and viral load today. We will continue on tivicay/drv-cobi/ZDV bid  oi proph = bactrim ds daily and azithromycin  Pneumonia = levoquin  x 5 days, plus robitussin  Health maintenance = received flu shot already, but will need hep B #3 in January  Literacy = gave him a printed  card " i am here to get refills on my medications" to show to pharmacy each month in order to minimize misunderstanding

## 2013-12-12 ENCOUNTER — Telehealth: Payer: Self-pay | Admitting: Licensed Clinical Social Worker

## 2013-12-12 LAB — COMPLETE METABOLIC PANEL WITH GFR
ALBUMIN: 3.9 g/dL (ref 3.5–5.2)
ALT: 10 U/L (ref 0–53)
AST: 26 U/L (ref 0–37)
Alkaline Phosphatase: 64 U/L (ref 39–117)
BUN: 13 mg/dL (ref 6–23)
CALCIUM: 9.6 mg/dL (ref 8.4–10.5)
CHLORIDE: 100 meq/L (ref 96–112)
CO2: 25 mEq/L (ref 19–32)
Creat: 1.27 mg/dL (ref 0.50–1.35)
GFR, Est African American: 80 mL/min
GFR, Est Non African American: 69 mL/min
Glucose, Bld: 94 mg/dL (ref 70–99)
POTASSIUM: 4.9 meq/L (ref 3.5–5.3)
Sodium: 136 mEq/L (ref 135–145)
Total Bilirubin: 0.4 mg/dL (ref 0.2–1.2)
Total Protein: 10.1 g/dL — ABNORMAL HIGH (ref 6.0–8.3)

## 2013-12-12 LAB — CBC WITH DIFFERENTIAL/PLATELET
Basophils Absolute: 0 10*3/uL (ref 0.0–0.1)
Basophils Relative: 0 % (ref 0–1)
Eosinophils Absolute: 0.2 10*3/uL (ref 0.0–0.7)
Eosinophils Relative: 6 % — ABNORMAL HIGH (ref 0–5)
HEMATOCRIT: 38.4 % — AB (ref 39.0–52.0)
Hemoglobin: 13.1 g/dL (ref 13.0–17.0)
LYMPHS ABS: 1.4 10*3/uL (ref 0.7–4.0)
LYMPHS PCT: 46 % (ref 12–46)
MCH: 34.9 pg — ABNORMAL HIGH (ref 26.0–34.0)
MCHC: 34.1 g/dL (ref 30.0–36.0)
MCV: 102.4 fL — ABNORMAL HIGH (ref 78.0–100.0)
MONO ABS: 0.5 10*3/uL (ref 0.1–1.0)
Monocytes Relative: 15 % — ABNORMAL HIGH (ref 3–12)
NEUTROS ABS: 1 10*3/uL — AB (ref 1.7–7.7)
Neutrophils Relative %: 33 % — ABNORMAL LOW (ref 43–77)
Platelets: 182 10*3/uL (ref 150–400)
RBC: 3.75 MIL/uL — ABNORMAL LOW (ref 4.22–5.81)
RDW: 15.2 % (ref 11.5–15.5)
WBC: 3.1 10*3/uL — AB (ref 4.0–10.5)

## 2013-12-12 NOTE — Telephone Encounter (Signed)
Critical lab of total protein 10.1 [facility]

## 2013-12-13 LAB — HIV-1 RNA QUANT-NO REFLEX-BLD
HIV 1 RNA Quant: <20 copies/mL (ref <20)
HIV-1 RNA Quant, Log: <1.3 {Log} (ref <1.30)

## 2014-03-10 ENCOUNTER — Emergency Department (HOSPITAL_COMMUNITY): Payer: Medicaid Other

## 2014-03-10 ENCOUNTER — Encounter (HOSPITAL_COMMUNITY): Payer: Self-pay | Admitting: *Deleted

## 2014-03-10 ENCOUNTER — Emergency Department (HOSPITAL_COMMUNITY)
Admission: EM | Admit: 2014-03-10 | Discharge: 2014-03-10 | Disposition: A | Discharge status: Home / Self Care | Payer: Medicaid Other | Attending: Emergency Medicine | Admitting: Emergency Medicine

## 2014-03-10 DIAGNOSIS — Z8611 Personal history of tuberculosis: Secondary | ICD-10-CM | POA: Insufficient documentation

## 2014-03-10 DIAGNOSIS — T1490XA Injury, unspecified, initial encounter: Secondary | ICD-10-CM

## 2014-03-10 DIAGNOSIS — Z79899 Other long term (current) drug therapy: Secondary | ICD-10-CM | POA: Diagnosis not present

## 2014-03-10 DIAGNOSIS — Y998 Other external cause status: Secondary | ICD-10-CM | POA: Insufficient documentation

## 2014-03-10 DIAGNOSIS — Y92828 Other wilderness area as the place of occurrence of the external cause: Secondary | ICD-10-CM | POA: Insufficient documentation

## 2014-03-10 DIAGNOSIS — W010XXA Fall on same level from slipping, tripping and stumbling without subsequent striking against object, initial encounter: Secondary | ICD-10-CM | POA: Insufficient documentation

## 2014-03-10 DIAGNOSIS — Z87891 Personal history of nicotine dependence: Secondary | ICD-10-CM | POA: Diagnosis not present

## 2014-03-10 DIAGNOSIS — S82434A Nondisplaced oblique fracture of shaft of right fibula, initial encounter for closed fracture: Secondary | ICD-10-CM | POA: Diagnosis not present

## 2014-03-10 DIAGNOSIS — S82401A Unspecified fracture of shaft of right fibula, initial encounter for closed fracture: Secondary | ICD-10-CM

## 2014-03-10 DIAGNOSIS — Z792 Long term (current) use of antibiotics: Secondary | ICD-10-CM | POA: Diagnosis not present

## 2014-03-10 DIAGNOSIS — W19XXXA Unspecified fall, initial encounter: Secondary | ICD-10-CM

## 2014-03-10 DIAGNOSIS — X58XXXA Exposure to other specified factors, initial encounter: Secondary | ICD-10-CM | POA: Diagnosis not present

## 2014-03-10 DIAGNOSIS — Y9389 Activity, other specified: Secondary | ICD-10-CM | POA: Insufficient documentation

## 2014-03-10 DIAGNOSIS — S8991XA Unspecified injury of right lower leg, initial encounter: Secondary | ICD-10-CM | POA: Diagnosis present

## 2014-03-10 DIAGNOSIS — Z21 Asymptomatic human immunodeficiency virus [HIV] infection status: Secondary | ICD-10-CM | POA: Insufficient documentation

## 2014-03-10 DIAGNOSIS — S82201A Unspecified fracture of shaft of right tibia, initial encounter for closed fracture: Secondary | ICD-10-CM | POA: Diagnosis not present

## 2014-03-10 MED ORDER — HYDROCODONE-ACETAMINOPHEN 5-325 MG PO TABS
2.0000 | ORAL_TABLET | Freq: Once | ORAL | Status: AC
  Administered 2014-03-10: 2 via ORAL
  Filled 2014-03-10: qty 2

## 2014-03-10 MED ORDER — HYDROCODONE-ACETAMINOPHEN 5-325 MG PO TABS: 2.0000 | ORAL_TABLET | ORAL | Status: DC | PRN

## 2014-03-10 MED ORDER — HYDROCODONE-ACETAMINOPHEN 5-325 MG PO TABS: 1.0000 | ORAL_TABLET | ORAL | Status: DC | PRN

## 2014-03-10 NOTE — ED Notes (Signed)
Discussed discharge plan and follow up plan with patient and family. Crutches provided with education. Review of plan with family and vocal confirmation of understanding provided. Pt has ride from friend to go home.

## 2014-03-10 NOTE — ED Notes (Signed)
Tried to contact language line to speak with Investment banker, corporatewahili translator, they do not have a Nurse, learning disabilitytranslator available. Tried to call several numbers here local listed on google without success. [employer] has asked through communications to all officers if they speak [name], no results, [employer] officer has gone to residence to see if friend who translates is there. We have not heard back. Barbara CowerJason RN is aware and trying to assist with finding someone.

## 2014-03-10 NOTE — ED Notes (Signed)
Per Dr. Mora Bellmanni, patient can be DC home EDP informed patient that he needs to call his friend

## 2014-03-10 NOTE — ED Notes (Signed)
Dr. Oni at bedside. 

## 2014-03-10 NOTE — ED Notes (Signed)
Dr. Mora Bellmanni at bedside with interpreter via language line

## 2014-03-10 NOTE — Discharge Instructions (Signed)
Tibial and Fibular Fracture, Adult [name], you were seen today after a fall and breaking your leg in 3 places.  It is very important for you to Go to the surgeon on Tuesday to discuss surgery and repairing your leg.  Take pain medication as prescribed.  This is very important.  If any symptoms worsen, come back to the Emergency Department immediately for repeat evaluation.  Thank you. Tibial and fibular fracture is a break in the bones of your lower leg (tibia and fibula). The tibia is the larger of these two bones. The fibula is the smaller of the two bones. It is on the outer side of your leg.  CAUSES  Low-energy injuries, such as a fall from ground level.  High-energy injuries, such as motor vehicle injuries, gunshot wounds, or high-speed sports collisions. RISK FACTORS  Jumping activities.  Repetitive stress, such as long-distance running.  Participation in sports.  Osteoporosis.  Advanced age. SIGNS AND SYMPTOMS  Pain.  Swelling.  Inability to put weight on your injured leg.  Bone deformities at the site of your injury.  Bruising. DIAGNOSIS  Tibial and fibular fractures are diagnosed with the use of X-ray exams. TREATMENT  If you have a simple fracture of these two bones, they can be treated with simple immobilization. A cast or splint will be used on your leg to keep it from moving while it heals. Then you can begin range-of-motion exercises to regain your knee motion. HOME CARE INSTRUCTIONS   Apply ice to your leg:  Put ice in a plastic bag.  Place a towel between your skin and the bag.  Leave the ice on for 20 minutes, 2-3 times a day.  If you have a plaster or fiberglass cast:  Do not try to scratch the skin under the cast using sharp or pointed objects.  Check the skin around the cast every day. You may put lotion on any red or sore areas.  Keep your cast dry and clean.  If you have a plaster splint:  Wear the splint as directed.  You may loosen  the elastic around the splint if your toes become numb, tingle, or turn cold or blue.  Do not put pressure on any part of your cast or splint until it is fully hardened, because it may deform.  Your cast or splint can be protected during bathing with a plastic bag. Do not lower the cast or splint into water.  Use crutches as directed.  Only take over-the-counter or prescription medicines for pain, discomfort, or fever as directed by your health care provider.  Follow all instructions given to you by your health care provider.  Make and keep all follow-up appointments. SEEK MEDICAL CARE IF:  Your pain is becoming worse rather than better or is not controlled with medicines.  You have increased swelling or redness in the foot.  You begin to lose feeling in your foot or toes. SEEK IMMEDIATE MEDICAL CARE IF:  You develop a cold or blue foot or toes on the injured side.  You develop severe pain in your injured leg, especially if the pain is increased with movement of your toes. MAKE SURE YOU:  Understand these instructions.  Will watch your condition.  Will get help right away if you are not doing well or get worse. Document Released: 11/22/2001 Document Revised: 12/21/2012 Document Reviewed: 10/12/2012 [facility] Patient Information 2015 [employer], [place]. This information is not intended to replace advice given to you by your health care provider.  Make sure you discuss any questions you have with your health care provider.    Muundi na fibular Fracture, Watu wazima Mheshimiwa [name], wewe Georgiann Cockerwalikuwa kuonekana leo baada ya [place] Guine[place]anguka na kuvunja mguu wako Shawneekatika maeneo 3. Kwenda upasuaji Jumanne kujadili upasuaji na ukarabati mguu wako. Hii ni muhimu sana. Kama dalili yoyote mbaya zaidi, kurudi [place] Arab RepublicIdara ya Dharura mara moja kwa ajili ya tathmini kurudia. Asante. Muundi na fibular fracture ni mapumziko katika mifupa ya mguu wako chini (muundi na fibula). Muundi ni kubwa ya mifupa hii miwili.  Fibula ni ndogo mifupa miwili. Ni upande wa nje wa mguu wako. Sababu Majeruhi chini ya nishati, kama vile [place] Guineakuanguka kutoka ngazi ya chini. Majeraha juu ya nishati, kama vile majeruhi gari, majeraha ya risasi, au yenye kasi ya michezo migongano. HATARI FACTORS Kuruka shughuli. Repetitive dhiki, kama vile umbali mrefu mbio. Ushiriki Albertson'skatika michezo. Osteoporosis. Juu umri. DALILI Maumivu. Uvimbe. Kutokuwa na Brennan Baileyuwezo wa kuweka uzito juu ya mguu wako kujeruhiwa. Mfupa ulemavu katika tovuti ya Southwest Airlineskuumia yako. Bruising. [place] Muundi na majeraha fibular wametambuliwa na matumizi ya mitihani ya X-ray. TREATMENT Kama una fracture rahisi ya mifupa hii miwili, wanaweza kutibiwa na immobilization rahisi. Kutupwa au banzi zitatumika juu ya mguu wako kuitunza kutoka kusonga wakati huponya. Basi unaweza Ghanakuanza mbalimbali ya-ya mwendo mazoezi kurejesha goti mwendo wako. HOME CARE MAELEKEZO [place] barafu kwa mguu wako: Kuweka barafu katika mfuko wa plastiki. Weka kitambaa kati ya ngozi yako na mfuko. Acha barafu juu ya kwa dakika 20, mara 2-3 kwa siku. Kama una plasta au fiberglass kutupwa: Je, si kujaribu scratch ngozi chini ya kutupwa kwa kutumia vitu vyenye ncha kali. Angalia ngozi karibu na kutupwa kila siku. Unaweza kuweka lotion juu ya maeneo ya nyekundu au Praxairkidonda chochote. Kuweka kutupwa yako kavu na safi. Kama una plasta banzi: Kuvaa banzi kama ilivyoagizwa. Unaweza kulegeza elastic kuzunguka banzi kama vidole kuwa numb, chacharika, au kugeuka baridi au bluu. Je, si Julieta Bellinikuweka shinikizo kwa sehemu yoyote ya Pacific Mutualkutupwa yako au banzi mpaka ni migumu kikamilifu, kwa sababu Burundiinaweza umbua. Kutupwa yako au banzi unaweza kuwa na ulinzi wakati wa kuoga na mfuko wa plastiki. Je, si kupunguza kutupwa au banzi ndani ya Franklinmaji. Kutumia magongo kama ilivyoagizwa. Cyndia Bentu Henrene Pastorkuchukua juu-ya Claudie Leachkukabiliana au dawa madawa ya maumivu, usumbufu, au homa kama ilivyoagizwa na mtoa huduma wako wa afya. Kufuata maelekezo yote  aliyopewa na wewe kwa mtoa huduma wako wa afya. Kufanya na kushika yote kufuatilia uteuzi. [facility] IF: Maumivu yako ni kuwa mbaya zaidi Pulte Homeskuliko bora au si kudhibitiwa na madawa. Wewe zimeongezeka uvimbe au Ameren CorporationWekundu katika mguu. Wewe kuanza kupoteza hisia katika mguu wako au vidole. [facility] IF: Kuendeleza baridi au bluu miguu au vidole upande kujeruhiwa. Kuendeleza maumivu makali katika mguu wako Mexicokujeruhiwa, [place]hasa kama maumivu ni kuongezeka kwa harakati za vidole.

## 2014-03-10 NOTE — ED Notes (Signed)
Patient with obvious deformity to right tibia Patient slipped down a hill, heard a pop and felt leg snap Patient states that he fell on his side Pedal pulses +2 upon palpation to right lower extremity Patient arrives with foam splint in place Patient denies head, neck, back pain Patient arrives alert and oriented x 4

## 2014-03-10 NOTE — Progress Notes (Signed)
Orthopedic Tech Progress Note Patient Details:  [name] 01/16/1971 [id number]  Ortho Devices Type of Ortho Device: Crutches, Short leg splint Ortho Device/Splint Interventions: Application   [name], Ernie Kasler M 03/10/2014, 5:48 AM

## 2014-03-10 NOTE — ED Notes (Signed)
Ortho tech at bedside 

## 2014-03-10 NOTE — ED Notes (Addendum)
Splint applied by Ortho tech EDP would like to speak with patient before DC Patient with limited English--[name] (No interpreter available, per language line staff) Patient's friend who is fluent in [name] left ED Patient aware that he will need to call friend back to ED for ride home

## 2014-03-10 NOTE — ED Notes (Signed)
Bed: RESB Expected date:  Expected time:  Means of arrival:  Comments: EMS 34M Tib fx

## 2014-03-10 NOTE — ED Notes (Signed)
Patient informed via medical translator that he needs to follow up with Dr. Ophelia CharterYates on Tuesday

## 2014-03-10 NOTE — ED Notes (Signed)
Pt placed on 2L O2 [place]

## 2014-03-10 NOTE — ED Notes (Signed)
Ortho tech paged  

## 2014-03-10 NOTE — ED Provider Notes (Signed)
CSN: [id number]     Arrival date & time 03/10/14  0255 History   First MD Initiated Contact with Patient 03/10/14 [phone]     Chief Complaint  Patient presents with  . Leg Injury     (Consider location/radiation/quality/duration/timing/severity/associated sxs/prior Treatment) HPI  [name] is a 43 y.o. male with past medical history of HIV, TB presenting today after a fall. History was obtained from his friend who is translating [place]. Patient was leaving his friend's house and slipped down a hill. He heard a pop and felt his leg snap. EMS saw an obvious deformity and placed a foam splint. He admits to tenderness over his right tibia and right ankle. He denies hitting his head or LOC. Patient states he has normal sensation distally and is able to wiggle his toes. He has no further complaints.  10 Systems reviewed and are negative for acute change except as noted in the HPI.     Past Medical History  Diagnosis Date  . HIV disease   . TB (pulmonary tuberculosis)     previously treated according to refugee documentation   History reviewed. No pertinent past surgical history. History reviewed. No pertinent family history. History  Substance Use Topics  . Smoking status: Former Games developermoker  . Smokeless tobacco: Not on file  . Alcohol Use: No    Review of Systems    Allergies  Review of patient's allergies indicates no known allergies.  Home Medications   Prior to Admission medications   Medication Sig Start Date End Date Taking? Authorizing Provider  acetaminophen (TYLENOL) 325 MG tablet Take 2 tablets (650 mg total) by mouth every 6 (six) hours as needed for mild pain. 10/09/13   Latrelle DodrillBrittany J McIntyre, MD  azithromycin (ZITHROMAX) 600 MG tablet Take 2 tablets (1,200 mg total) by mouth once a week. 11/13/13   Judyann Munsonynthia Snider, MD  cyclobenzaprine (FLEXERIL) 5 MG tablet Take 1 tablet (5 mg total) by mouth 3 (three) times daily as needed for muscle spasms. 10/09/13   Latrelle DodrillBrittany  J McIntyre, MD  darunavir-cobicistat (PREZCOBIX) 800-150 MG per tablet Take 1 tablet by mouth daily. Swallow whole. Do NOT crush, break or chew tablets. Take with food. 11/13/13   Judyann Munsonynthia Snider, MD  dolutegravir (TIVICAY) 50 MG tablet Take 1 tablet (50 mg total) by mouth daily. 11/13/13   Judyann Munsonynthia Snider, MD  guaiFENesin-dextromethorphan [facility](ROBITUSSIN DM) 100-10 MG/5ML syrup Take 5 mLs by mouth every 4 (four) hours as needed for cough. 12/11/13   Judyann Munsonynthia Snider, MD  levofloxacin (LEVAQUIN) 750 MG tablet Take 1 tablet (750 mg total) by mouth daily. 12/11/13   Judyann Munsonynthia Snider, MD  sulfamethoxazole-trimethoprim (BACTRIM) 400-80 MG per tablet Take 1 tablet by mouth daily. 10/26/13   Judyann Munsonynthia Snider, MD  zidovudine (RETROVIR) 300 MG tablet Take 1 tablet (300 mg total) by mouth 2 (two) times daily. 11/13/13   Judyann Munsonynthia Snider, MD   BP 135/78 mmHg  Pulse 87  Temp(Src) 98.4 F (36.9 C) (Oral)  Resp 16  SpO2 97% Physical Exam  Constitutional: He is oriented to person, place, and time. Vital signs are normal. He appears well-developed and well-nourished.  Non-toxic appearance. He does not appear ill. No distress.  HENT:  Head: Normocephalic and atraumatic.  Nose: Nose normal.  Mouth/Throat: Oropharynx is clear and moist. No oropharyngeal exudate.  Eyes: Conjunctivae and EOM are normal. Pupils are equal, round, and reactive to light. No scleral icterus.  Neck: Normal range of motion. Neck supple. No tracheal deviation, no edema, no erythema  and normal range of motion present. No thyroid mass and no thyromegaly present.  Cardiovascular: Normal rate, regular rhythm, S1 normal, S2 normal, normal heart sounds, intact distal pulses and normal pulses.  Exam reveals no gallop and no friction rub.   No murmur heard. Pulses:      Radial pulses are 2+ on the right side, and 2+ on the left side.       Dorsalis pedis pulses are 2+ on the right side, and 2+ on the left side.  Pulmonary/Chest: Effort normal and breath sounds  normal. No respiratory distress. He has no wheezes. He has no rhonchi. He has no rales.  Abdominal: Soft. Normal appearance and bowel sounds are normal. He exhibits no distension, no ascites and no mass. There is no hepatosplenomegaly. There is no tenderness. There is no rebound, no guarding and no CVA tenderness.  Musculoskeletal: He exhibits edema and tenderness.  Right tibia with obvious deformity and swelling, there is tenderness to palpation over the mid tibia and ankle joint. 2+ pulses distally and dorsalis pedis and posterior tibial arteries. Normal sensation.  Lymphadenopathy:    He has no cervical adenopathy.  Neurological: He is alert and oriented to person, place, and time. He has normal strength. No cranial nerve deficit or sensory deficit. He exhibits normal muscle tone. GCS eye subscore is 4. GCS verbal subscore is 5. GCS motor subscore is 6.  Skin: Skin is warm, dry and intact. No petechiae and no rash noted. He is not diaphoretic. No erythema. No pallor.  Psychiatric: He has a normal mood and affect. His behavior is normal. Judgment normal.  Nursing note and vitals reviewed.   ED Course  Procedures (including critical care time) Labs Review Labs Reviewed - No data to display  Imaging Review Dg Tibia/fibula Right  03/10/2014   CLINICAL DATA:  Acute onset of right lower leg deformity and pain. Patient slipped down stairs and heard pop. Initial encounter.  EXAM: RIGHT TIBIA AND FIBULA - 2 VIEW  COMPARISON:  None.  FINDINGS: There is a displaced fracture involving the midshaft of the tibia, with approximately 1/3 shaft width lateral and posterior displacement. A nondisplaced oblique fracture line is seen extending distally, without definite intra-articular extension.  There is also a nondisplaced oblique fracture line involving the distal fibular diaphysis, as well as a mildly comminuted fracture involving the proximal fibula.  Associated soft tissue swelling is noted. Trace knee  joint fluid is within normal limits. The knee joint is grossly unremarkable. The ankle mortise is incompletely assessed, but appears grossly unremarkable.  IMPRESSION: 1. Displaced fracture involving the midshaft of the tibia, with 1/3 shaft width lateral and posterior displacement. Associated nondisplaced oblique fracture line extends distally, without definite intra-articular extension. 2. Nondisplaced oblique fracture line involving the distal fibular diaphysis, and mildly comminuted fracture at the proximal fibula.   Electronically Signed   By: Roanna RaiderJeffery  Chang M.D.   On: 03/10/2014 03:43   Dg Ankle Complete Right  03/10/2014   CLINICAL DATA:  Status post slip and fall down stairs. Right lower leg deformity and pain. Initial encounter.  EXAM: RIGHT ANKLE - COMPLETE 3+ VIEW  COMPARISON:  None.  FINDINGS: There are nondisplaced oblique fractures noted at the distal fibular diaphysis and extending along the mid to distal tibial diaphysis. The displaced mid tibial fracture is better characterized on concurrent tibia/fibula radiographs.  There is also a fracture extending across the medial aspect of the distal tibia, extending into the tibial plafond, with minimal displacement.  No ankle mortise widening is seen. Given the location of the distal tibial fracture, mild disruption of the interosseous space cannot be excluded. Mild soft tissue swelling is noted about the ankle.  IMPRESSION: 1. Minimally displaced fracture noted extending across the medial aspect of the distal tibia, extending into the tibial plafond; this was not well seen on tibia/fibula radiographs. Given the location of this fracture, mild disruption of the interosseous space cannot be excluded. 2. Nondisplaced oblique fractures at the distal fibular diaphysis and extending along the mid to distal tibial diaphysis, as characterized on tibia/fibula radiographs.   Electronically Signed   By: Roanna Raider M.D.   On: 03/10/2014 03:57     EKG  Interpretation None      MDM   Final diagnoses:  Fall    Patient presents to the emergency department after falling and suffering a midshaft tibial fracture with displacement and comminuted fracture of the proximal fibula.  He was given fentanyl 200 mics in route for pain.  Case was discussed with Dr. Ophelia Charter who reviewed the images.  He recommends for DC with pain medication and follow up in his office on Tuesday.  This was told to the patient via medical translator.  His compartment remain soft, his pain is well managed, crutches were given, his VS remain within his normal limits and he is safe for DC.    Tomasita Crumble, MD 03/10/14 [phone]

## 2014-03-13 ENCOUNTER — Other Ambulatory Visit (HOSPITAL_COMMUNITY): Payer: Self-pay | Admitting: Orthopaedic Surgery

## 2014-03-15 NOTE — Progress Notes (Signed)
Using [name] interpreter # [phone],   I left  A message on voice mail.  I [facility] 12 noon  , nothing to eat or drink after midnight.   I instructed the patient to take the following medications in the am with just enough water to get them down: " If you can take these medications on an empty stomach, take antivirals and pain medication if needed."  I asked patient to not wear any lotions, powders, cologne, jewelry, piercing, make-up or nail polish.  I asked the patient to call [phone], in the am if there were any questions or problems.

## 2014-03-15 NOTE — H&P (Signed)
  [facility]   A Division of [facility]   [address], [place], [place] [address] Telephone: [phone]  Fax: [phone]     PATIENT: [name], [name]   MR#: [id number]  DOB: 12/28/1970   Visit Date: 03/13/2014     HISTORY OF PRESENT ILLNESS:  A 43 year old male who speaks [name].  His English is not very good.  He slipped and fell down the steps.  Went to the emergency room where x-rays demonstrated a short oblique midshaft tibial fracture with distal tibial pilon fracture nondisplaced and a distal fibular fracture 7 to 8 cm above the fibula without significant displacement.  He has been in a short leg splint, on crutches.  Has pain medication.   PAST MEDICAL/SURGICAL HISTORY:  No previous surgeries.   SOCIAL HISTORY:  Nonsmoker.    FAMILY HISTORY:  Negative.   REVIEW OF SYSTEMS:  No history of rheumatologic conditions.   PHYSICAL EXAMINATION:  Heart regular.  Lungs clear to auscultation.  No wheezing.  Distal pulses intact.  Sensation in his toes intact.   ASSESSMENT/DIAGNOSIS:  Displaced tibia fracture, fibular fracture, and pilon fracture.  Pilon is in good position.  We could fix this with some percutaneous screw fixation and then proceed with tibial nail.  I discussed with him that if the fibular fracture does not displace, we would be able to avoid fixation since he would be nonweightbearing postop.  Questions elicited and answered.  He had some friends here who also had some [place] language skills.   PLAN:  Will proceed with surgery next week.  All questions answered.  He understands and requests to proceed.   For additional information please see handwritten notes, reports, orders and prescriptions in this chart.      Mark C. Ophelia CharterYates, M.D.    Auto-Authenticated by Veverly FellsMark C. Ophelia CharterYates, M.D.  MCY/lb DD: 03/13/2014  DT: 03/14/2014

## 2014-03-15 NOTE — Progress Notes (Signed)
The [phone] is not this patients number.

## 2014-03-18 MED ORDER — CEFAZOLIN SODIUM-DEXTROSE 2-3 GM-% IV SOLR: 2.0000 g | INTRAVENOUS | Status: DC

## 2014-03-19 ENCOUNTER — Encounter (HOSPITAL_COMMUNITY): Admission: RE | Payer: Self-pay | Source: Ambulatory Visit

## 2014-03-19 ENCOUNTER — Ambulatory Visit: Payer: Medicaid Other | Admitting: Internal Medicine

## 2014-03-19 ENCOUNTER — Inpatient Hospital Stay (HOSPITAL_COMMUNITY): Admission: RE | Admit: 2014-03-19 | Payer: Medicaid Other | Source: Ambulatory Visit | Admitting: Orthopaedic Surgery

## 2014-03-19 SURGERY — PINNING, EXTREMITY, PERCUTANEOUS
Anesthesia: General | Laterality: Right

## 2014-03-19 MED ORDER — ARTIFICIAL TEARS OP OINT
TOPICAL_OINTMENT | OPHTHALMIC | Status: AC
Start: 2014-03-19 — End: 2014-03-19
  Filled 2014-03-19: qty 3.5

## 2014-03-19 MED ORDER — ONDANSETRON HCL 4 MG/2ML IJ SOLN
INTRAMUSCULAR | Status: AC
  Filled 2014-03-19: qty 2

## 2014-03-19 MED ORDER — GLYCOPYRROLATE 0.2 MG/ML IJ SOLN
INTRAMUSCULAR | Status: AC
  Filled 2014-03-19: qty 5

## 2014-03-19 MED ORDER — ROCURONIUM BROMIDE 50 MG/5ML IV SOLN
INTRAVENOUS | Status: AC
Start: 2014-03-19 — End: 2014-03-19
  Filled 2014-03-19: qty 1

## 2014-03-19 MED ORDER — FENTANYL CITRATE 0.05 MG/ML IJ SOLN
INTRAMUSCULAR | Status: AC
  Filled 2014-03-19: qty 5

## 2014-03-19 MED ORDER — MIDAZOLAM HCL 2 MG/2ML IJ SOLN
INTRAMUSCULAR | Status: AC
  Filled 2014-03-19: qty 2

## 2014-03-19 MED ORDER — LIDOCAINE HCL (CARDIAC) 20 MG/ML IV SOLN
INTRAVENOUS | Status: AC
  Filled 2014-03-19: qty 5

## 2014-03-19 MED ORDER — PROPOFOL 10 MG/ML IV BOLUS
INTRAVENOUS | Status: AC
  Filled 2014-03-19: qty 20

## 2014-03-20 NOTE — Progress Notes (Signed)
Unable to contact pt; lvm with pre-op instructions according to pre-op checklist using interpreter John # [phone].

## 2014-03-21 ENCOUNTER — Inpatient Hospital Stay (HOSPITAL_COMMUNITY)
Admission: RE | Admit: 2014-03-21 | Discharge: 2014-03-24 | DRG: 492 | Disposition: A | Discharge status: Home Health | Payer: Medicaid Other | Source: Ambulatory Visit | Attending: Orthopaedic Surgery | Admitting: Orthopaedic Surgery

## 2014-03-21 ENCOUNTER — Inpatient Hospital Stay (HOSPITAL_COMMUNITY): Payer: Medicaid Other | Admitting: Certified Registered Nurse Anesthetist

## 2014-03-21 ENCOUNTER — Inpatient Hospital Stay (HOSPITAL_COMMUNITY): Payer: Medicaid Other

## 2014-03-21 ENCOUNTER — Encounter (HOSPITAL_COMMUNITY)
Admission: RE | Disposition: A | Discharge status: Home Health | Payer: Self-pay | Source: Ambulatory Visit | Attending: Orthopaedic Surgery

## 2014-03-21 ENCOUNTER — Encounter (HOSPITAL_COMMUNITY): Payer: Self-pay | Admitting: *Deleted

## 2014-03-21 ENCOUNTER — Emergency Department (HOSPITAL_COMMUNITY)
Admission: EM | Admit: 2014-03-21 | Discharge: 2014-03-21 | Disposition: A | Discharge status: Other Acute Inpatient Hospital | Payer: Medicaid Other

## 2014-03-21 DIAGNOSIS — K759 Inflammatory liver disease, unspecified: Secondary | ICD-10-CM | POA: Diagnosis present

## 2014-03-21 DIAGNOSIS — B20 Human immunodeficiency virus [HIV] disease: Secondary | ICD-10-CM | POA: Diagnosis present

## 2014-03-21 DIAGNOSIS — Z87891 Personal history of nicotine dependence: Secondary | ICD-10-CM

## 2014-03-21 DIAGNOSIS — F329 Major depressive disorder, single episode, unspecified: Secondary | ICD-10-CM | POA: Diagnosis present

## 2014-03-21 DIAGNOSIS — Z8781 Personal history of (healed) traumatic fracture: Secondary | ICD-10-CM

## 2014-03-21 DIAGNOSIS — S82871A Displaced pilon fracture of right tibia, initial encounter for closed fracture: Principal | ICD-10-CM | POA: Diagnosis present

## 2014-03-21 DIAGNOSIS — W19XXXA Unspecified fall, initial encounter: Secondary | ICD-10-CM | POA: Diagnosis present

## 2014-03-21 DIAGNOSIS — Z9889 Other specified postprocedural states: Secondary | ICD-10-CM

## 2014-03-21 DIAGNOSIS — N289 Disorder of kidney and ureter, unspecified: Secondary | ICD-10-CM | POA: Diagnosis present

## 2014-03-21 DIAGNOSIS — W109XXA Fall (on) (from) unspecified stairs and steps, initial encounter: Secondary | ICD-10-CM | POA: Diagnosis present

## 2014-03-21 DIAGNOSIS — S82454A Nondisplaced comminuted fracture of shaft of right fibula, initial encounter for closed fracture: Secondary | ICD-10-CM | POA: Diagnosis present

## 2014-03-21 DIAGNOSIS — Z419 Encounter for procedure for purposes other than remedying health state, unspecified: Secondary | ICD-10-CM | POA: Insufficient documentation

## 2014-03-21 HISTORY — DX: Depression, unspecified: F32.A

## 2014-03-21 HISTORY — DX: Personal history of (healed) traumatic fracture: Z87.81

## 2014-03-21 HISTORY — PX: ORIF ANKLE FRACTURE: SUR919

## 2014-03-21 HISTORY — PX: IM NAILING TIBIA: SUR734

## 2014-03-21 HISTORY — PX: ORIF ANKLE FRACTURE: SHX5408

## 2014-03-21 HISTORY — PX: TIBIA IM NAIL INSERTION: SHX2516

## 2014-03-21 HISTORY — DX: Major depressive disorder, single episode, unspecified: F32.9

## 2014-03-21 HISTORY — DX: Inflammatory liver disease, unspecified: K75.9

## 2014-03-21 LAB — CBC
HCT: 41.5 % (ref 39.0–52.0)
Hemoglobin: 13.9 g/dL (ref 13.0–17.0)
MCH: 34 pg (ref 26.0–34.0)
MCHC: 33.5 g/dL (ref 30.0–36.0)
MCV: 101.5 fL — ABNORMAL HIGH (ref 78.0–100.0)
Platelets: 227 10*3/uL (ref 150–400)
RBC: 4.09 MIL/uL — ABNORMAL LOW (ref 4.22–5.81)
RDW: 11.7 % (ref 11.5–15.5)
WBC: 4.4 10*3/uL (ref 4.0–10.5)

## 2014-03-21 LAB — COMPREHENSIVE METABOLIC PANEL
ALK PHOS: 72 U/L (ref 39–117)
ALT: 12 U/L (ref 0–53)
ANION GAP: 8 (ref 5–15)
AST: 32 U/L (ref 0–37)
Albumin: 3.2 g/dL — ABNORMAL LOW (ref 3.5–5.2)
BILIRUBIN TOTAL: 0.5 mg/dL (ref 0.3–1.2)
BUN: 18 mg/dL (ref 6–23)
CALCIUM: 9.4 mg/dL (ref 8.4–10.5)
CO2: 28 mmol/L (ref 19–32)
CREATININE: 1.31 mg/dL (ref 0.50–1.35)
Chloride: 100 mEq/L (ref 96–112)
GFR calc Af Amer: 76 mL/min — ABNORMAL LOW (ref >90)
GFR calc non Af Amer: 65 mL/min — ABNORMAL LOW (ref >90)
Glucose, Bld: 105 mg/dL — ABNORMAL HIGH (ref 70–99)
Potassium: 4.5 mmol/L (ref 3.5–5.1)
Sodium: 136 mmol/L (ref 135–145)
TOTAL PROTEIN: 9.7 g/dL — AB (ref 6.0–8.3)

## 2014-03-21 LAB — PROTIME-INR
INR: 1.15 (ref 0.00–1.49)
Prothrombin Time: 14.8 seconds (ref 11.6–15.2)

## 2014-03-21 SURGERY — INSERTION, INTRAMEDULLARY ROD, TIBIA
Anesthesia: General | Site: Leg Lower | Laterality: Right

## 2014-03-21 MED ORDER — CEFAZOLIN SODIUM-DEXTROSE 2-3 GM-% IV SOLR
INTRAVENOUS | Status: DC | PRN
  Administered 2014-03-21: 2 g via INTRAVENOUS

## 2014-03-21 MED ORDER — POTASSIUM CHLORIDE IN NACL 20-0.45 MEQ/L-% IV SOLN
INTRAVENOUS | Status: DC
  Administered 2014-03-21 – 2014-03-24 (×4): via INTRAVENOUS
  Filled 2014-03-21 (×8): qty 1000

## 2014-03-21 MED ORDER — SENNOSIDES-DOCUSATE SODIUM 8.6-50 MG PO TABS: 1.0000 | ORAL_TABLET | Freq: Every evening | ORAL | Status: DC | PRN

## 2014-03-21 MED ORDER — FENTANYL CITRATE 0.05 MG/ML IJ SOLN
INTRAMUSCULAR | Status: DC | PRN
  Administered 2014-03-21: 100 ug via INTRAVENOUS
  Administered 2014-03-21: 50 ug via INTRAVENOUS
  Administered 2014-03-21: 100 ug via INTRAVENOUS

## 2014-03-21 MED ORDER — MIDAZOLAM HCL 2 MG/2ML IJ SOLN
INTRAMUSCULAR | Status: AC
  Administered 2014-03-21: 2 mg via INTRAVENOUS
  Filled 2014-03-21: qty 2

## 2014-03-21 MED ORDER — ROCURONIUM BROMIDE 100 MG/10ML IV SOLN
INTRAVENOUS | Status: DC | PRN
  Administered 2014-03-21: 40 mg via INTRAVENOUS

## 2014-03-21 MED ORDER — ONDANSETRON HCL 4 MG/2ML IJ SOLN: 4.0000 mg | Freq: Four times a day (QID) | INTRAMUSCULAR | Status: DC | PRN

## 2014-03-21 MED ORDER — FENTANYL CITRATE 0.05 MG/ML IJ SOLN
50.0000 ug | INTRAMUSCULAR | Status: DC | PRN
  Administered 2014-03-21: 100 ug via INTRAVENOUS

## 2014-03-21 MED ORDER — AZITHROMYCIN 600 MG PO TABS
1200.0000 mg | ORAL_TABLET | ORAL | Status: DC
  Administered 2014-03-22: 1200 mg via ORAL
  Filled 2014-03-21: qty 2

## 2014-03-21 MED ORDER — FENTANYL CITRATE 0.05 MG/ML IJ SOLN
INTRAMUSCULAR | Status: AC
  Administered 2014-03-21: 100 ug via INTRAVENOUS
  Filled 2014-03-21: qty 2

## 2014-03-21 MED ORDER — OXYCODONE HCL 5 MG PO TABS
5.0000 mg | ORAL_TABLET | ORAL | Status: DC | PRN
  Administered 2014-03-21 – 2014-03-24 (×7): 10 mg via ORAL
  Filled 2014-03-21 (×7): qty 2

## 2014-03-21 MED ORDER — ONDANSETRON HCL 4 MG/2ML IJ SOLN
INTRAMUSCULAR | Status: DC | PRN
  Administered 2014-03-21: 4 mg via INTRAVENOUS

## 2014-03-21 MED ORDER — METHOCARBAMOL 500 MG PO TABS
500.0000 mg | ORAL_TABLET | Freq: Four times a day (QID) | ORAL | Status: DC | PRN
  Administered 2014-03-21 – 2014-03-24 (×2): 500 mg via ORAL
  Filled 2014-03-21 (×2): qty 1

## 2014-03-21 MED ORDER — LACTATED RINGERS IV SOLN
INTRAVENOUS | Status: DC | PRN
  Administered 2014-03-21: 15:00:00 via INTRAVENOUS

## 2014-03-21 MED ORDER — HYDROMORPHONE HCL 1 MG/ML IJ SOLN
0.2500 mg | INTRAMUSCULAR | Status: DC | PRN
  Administered 2014-03-21 (×4): 0.5 mg via INTRAVENOUS

## 2014-03-21 MED ORDER — SULFAMETHOXAZOLE-TRIMETHOPRIM 400-80 MG PO TABS
1.0000 | ORAL_TABLET | Freq: Every day | ORAL | Status: DC
  Administered 2014-03-21 – 2014-03-24 (×4): 1 via ORAL
  Filled 2014-03-21 (×4): qty 1

## 2014-03-21 MED ORDER — ZIDOVUDINE 100 MG PO CAPS
300.0000 mg | ORAL_CAPSULE | Freq: Two times a day (BID) | ORAL | Status: DC
  Administered 2014-03-21 – 2014-03-24 (×6): 300 mg via ORAL
  Filled 2014-03-21 (×7): qty 3

## 2014-03-21 MED ORDER — METHOCARBAMOL 1000 MG/10ML IJ SOLN
500.0000 mg | Freq: Four times a day (QID) | INTRAMUSCULAR | Status: DC | PRN
  Filled 2014-03-21: qty 5

## 2014-03-21 MED ORDER — LEVOFLOXACIN 750 MG PO TABS
750.0000 mg | ORAL_TABLET | Freq: Every day | ORAL | Status: DC
  Administered 2014-03-21 – 2014-03-22 (×2): 750 mg via ORAL
  Filled 2014-03-21 (×2): qty 1

## 2014-03-21 MED ORDER — DOLUTEGRAVIR SODIUM 50 MG PO TABS
50.0000 mg | ORAL_TABLET | Freq: Every day | ORAL | Status: DC
  Administered 2014-03-21 – 2014-03-24 (×4): 50 mg via ORAL
  Filled 2014-03-21 (×5): qty 1

## 2014-03-21 MED ORDER — ONDANSETRON HCL 4 MG PO TABS
4.0000 mg | ORAL_TABLET | Freq: Four times a day (QID) | ORAL | Status: DC | PRN
  Administered 2014-03-24: 4 mg via ORAL
  Filled 2014-03-21: qty 1

## 2014-03-21 MED ORDER — MIDAZOLAM HCL 2 MG/2ML IJ SOLN
1.0000 mg | INTRAMUSCULAR | Status: DC | PRN
  Administered 2014-03-21: 2 mg via INTRAVENOUS

## 2014-03-21 MED ORDER — OXYCODONE HCL 5 MG/5ML PO SOLN
5.0000 mg | Freq: Once | ORAL | Status: AC | PRN
Start: 2014-03-21 — End: 2014-03-21

## 2014-03-21 MED ORDER — PHENYLEPHRINE HCL 10 MG/ML IJ SOLN
INTRAMUSCULAR | Status: DC | PRN
  Administered 2014-03-21: 40 ug via INTRAVENOUS

## 2014-03-21 MED ORDER — GLYCOPYRROLATE 0.2 MG/ML IJ SOLN
INTRAMUSCULAR | Status: DC | PRN
  Administered 2014-03-21: 0.6 mg via INTRAVENOUS

## 2014-03-21 MED ORDER — MIDAZOLAM HCL 5 MG/5ML IJ SOLN
INTRAMUSCULAR | Status: DC | PRN
  Administered 2014-03-21: 2 mg via INTRAVENOUS

## 2014-03-21 MED ORDER — 0.9 % SODIUM CHLORIDE (POUR BTL) OPTIME
TOPICAL | Status: DC | PRN
  Administered 2014-03-21: 1000 mL

## 2014-03-21 MED ORDER — LACTATED RINGERS IV SOLN
INTRAVENOUS | Status: DC
  Administered 2014-03-21: 14:00:00 via INTRAVENOUS

## 2014-03-21 MED ORDER — ONDANSETRON HCL 4 MG/2ML IJ SOLN: 4.0000 mg | Freq: Once | INTRAMUSCULAR | Status: DC | PRN

## 2014-03-21 MED ORDER — LIDOCAINE HCL (CARDIAC) 20 MG/ML IV SOLN
INTRAVENOUS | Status: DC | PRN
  Administered 2014-03-21: 40 mg via INTRAVENOUS

## 2014-03-21 MED ORDER — HYDROMORPHONE HCL 1 MG/ML IJ SOLN
0.5000 mg | INTRAMUSCULAR | Status: DC | PRN
  Administered 2014-03-22 (×2): 1 mg via INTRAVENOUS
  Filled 2014-03-21 (×2): qty 1

## 2014-03-21 MED ORDER — PROPOFOL 10 MG/ML IV BOLUS
INTRAVENOUS | Status: DC | PRN
  Administered 2014-03-21: 200 mg via INTRAVENOUS

## 2014-03-21 MED ORDER — NEOSTIGMINE METHYLSULFATE 10 MG/10ML IV SOLN
INTRAVENOUS | Status: DC | PRN
  Administered 2014-03-21: 1 mg via INTRAVENOUS
  Administered 2014-03-21: 4 mg via INTRAVENOUS

## 2014-03-21 MED ORDER — METOCLOPRAMIDE HCL 5 MG/ML IJ SOLN: 5.0000 mg | Freq: Three times a day (TID) | INTRAMUSCULAR | Status: DC | PRN

## 2014-03-21 MED ORDER — GUAIFENESIN-DM 100-10 MG/5ML PO SYRP: 5.0000 mL | ORAL_SOLUTION | ORAL | Status: DC | PRN

## 2014-03-21 MED ORDER — DARUNAVIR-COBICISTAT 800-150 MG PO TABS
1.0000 | ORAL_TABLET | Freq: Every day | ORAL | Status: DC
  Administered 2014-03-21: 1 via ORAL
  Filled 2014-03-21 (×2): qty 1

## 2014-03-21 MED ORDER — DOCUSATE SODIUM 100 MG PO CAPS
100.0000 mg | ORAL_CAPSULE | Freq: Two times a day (BID) | ORAL | Status: DC
  Administered 2014-03-21 – 2014-03-24 (×6): 100 mg via ORAL
  Filled 2014-03-21 (×5): qty 1

## 2014-03-21 MED ORDER — OXYCODONE HCL 5 MG PO TABS
5.0000 mg | ORAL_TABLET | Freq: Once | ORAL | Status: AC | PRN
  Administered 2014-03-21: 5 mg via ORAL

## 2014-03-21 MED ORDER — OXYCODONE-ACETAMINOPHEN 5-325 MG PO TABS
1.0000 | ORAL_TABLET | ORAL | Status: DC | PRN
  Administered 2014-03-22 – 2014-03-23 (×2): 2 via ORAL
  Filled 2014-03-21 (×2): qty 2

## 2014-03-21 MED ORDER — METOCLOPRAMIDE HCL 5 MG PO TABS: 5.0000 mg | ORAL_TABLET | Freq: Three times a day (TID) | ORAL | Status: DC | PRN

## 2014-03-21 SURGICAL SUPPLY — 90 items
BANDAGE ELASTIC 3 VELCRO ST LF (GAUZE/BANDAGES/DRESSINGS) IMPLANT
BANDAGE ELASTIC 4 VELCRO ST LF (GAUZE/BANDAGES/DRESSINGS) IMPLANT
BANDAGE ELASTIC 6 VELCRO ST LF (GAUZE/BANDAGES/DRESSINGS) IMPLANT
BANDAGE ESMARK 6X9 LF (GAUZE/BANDAGES/DRESSINGS) IMPLANT
BENZOIN TINCTURE PRP APPL 2/3 (GAUZE/BANDAGES/DRESSINGS) IMPLANT
BIT DRILL 3.8X6 NS (BIT) ×3 IMPLANT
BIT DRILL 4.4 NS (BIT) ×3 IMPLANT
BIT DRILL CANN 3.5MM (DRILL) ×2 IMPLANT
BLADE SURG 15 STRL LF DISP TIS (BLADE) ×2 IMPLANT
BLADE SURG 15 STRL SS (BLADE) ×1
BLADE SURG ROTATE 9660 (MISCELLANEOUS) IMPLANT
BNDG COHESIVE 6X5 TAN STRL LF (GAUZE/BANDAGES/DRESSINGS) ×3 IMPLANT
BNDG ESMARK 6X9 LF (GAUZE/BANDAGES/DRESSINGS)
BNDG GAUZE ELAST 4 BULKY (GAUZE/BANDAGES/DRESSINGS) IMPLANT
COTTON STERILE ROLL (GAUZE/BANDAGES/DRESSINGS) ×3 IMPLANT
COVER MAYO STAND STRL (DRAPES) ×3 IMPLANT
COVER SURGICAL LIGHT HANDLE (MISCELLANEOUS) ×6 IMPLANT
CUFF TOURNIQUET SINGLE 18IN (TOURNIQUET CUFF) IMPLANT
CUFF TOURNIQUET SINGLE 24IN (TOURNIQUET CUFF) IMPLANT
CUFF TOURNIQUET SINGLE 34IN LL (TOURNIQUET CUFF) ×3 IMPLANT
CUFF TOURNIQUET SINGLE 44IN (TOURNIQUET CUFF) IMPLANT
DRAPE C-ARM 42X72 X-RAY (DRAPES) ×3 IMPLANT
DRAPE IMP U-DRAPE 54X76 (DRAPES) ×3 IMPLANT
DRAPE INCISE IOBAN 66X45 STRL (DRAPES) ×3 IMPLANT
DRAPE ORTHO SPLIT 77X108 STRL (DRAPES) ×2
DRAPE PROXIMA HALF (DRAPES) ×6 IMPLANT
DRAPE SURG ORHT 6 SPLT 77X108 (DRAPES) ×4 IMPLANT
DRAPE U-SHAPE 47X51 STRL (DRAPES) ×3 IMPLANT
DRILL CANN 3.5MM (DRILL) ×3
DRSG EMULSION OIL 3X3 NADH (GAUZE/BANDAGES/DRESSINGS) IMPLANT
DRSG PAD ABDOMINAL 8X10 ST (GAUZE/BANDAGES/DRESSINGS) ×3 IMPLANT
DURAPREP 26ML APPLICATOR (WOUND CARE) ×3 IMPLANT
ELECT REM PT RETURN 9FT ADLT (ELECTROSURGICAL) ×3
ELECTRODE REM PT RTRN 9FT ADLT (ELECTROSURGICAL) ×2 IMPLANT
FACESHIELD WRAPAROUND (MASK) ×3 IMPLANT
GAUZE SPONGE 4X4 12PLY STRL (GAUZE/BANDAGES/DRESSINGS) ×3 IMPLANT
GAUZE XEROFORM 1X8 LF (GAUZE/BANDAGES/DRESSINGS) IMPLANT
GAUZE XEROFORM 5X9 LF (GAUZE/BANDAGES/DRESSINGS) ×3 IMPLANT
GLOVE BIO SURGEON STRL SZ7.5 (GLOVE) ×3 IMPLANT
GLOVE BIOGEL PI IND STRL 7.5 (GLOVE) ×6 IMPLANT
GLOVE BIOGEL PI IND STRL 8 (GLOVE) ×2 IMPLANT
GLOVE BIOGEL PI INDICATOR 7.5 (GLOVE) ×3
GLOVE BIOGEL PI INDICATOR 8 (GLOVE) ×1
GLOVE ECLIPSE 7.0 STRL STRAW (GLOVE) IMPLANT
GLOVE ORTHO TXT STRL SZ7.5 (GLOVE) ×3 IMPLANT
GOWN STRL REUS W/ TWL LRG LVL3 (GOWN DISPOSABLE) ×4 IMPLANT
GOWN STRL REUS W/ TWL XL LVL3 (GOWN DISPOSABLE) ×4 IMPLANT
GOWN STRL REUS W/TWL LRG LVL3 (GOWN DISPOSABLE) ×2
GOWN STRL REUS W/TWL XL LVL3 (GOWN DISPOSABLE) ×2
GUIDEWIRE BALL NOSE 80CM (WIRE) ×3 IMPLANT
KIT BASIN OR (CUSTOM PROCEDURE TRAY) ×3 IMPLANT
KIT ROOM TURNOVER OR (KITS) ×3 IMPLANT
MANIFOLD NEPTUNE II (INSTRUMENTS) ×3 IMPLANT
NAIL TIBIAL 10X39M (Nail) ×3 IMPLANT
NS IRRIG 1000ML POUR BTL (IV SOLUTION) ×3 IMPLANT
PACK GENERAL/GYN (CUSTOM PROCEDURE TRAY) ×3 IMPLANT
PACK ORTHO EXTREMITY (CUSTOM PROCEDURE TRAY) ×3 IMPLANT
PACK UNIVERSAL I (CUSTOM PROCEDURE TRAY) ×3 IMPLANT
PAD ARMBOARD 7.5X6 YLW CONV (MISCELLANEOUS) ×6 IMPLANT
PAD CAST 4YDX4 CTTN HI CHSV (CAST SUPPLIES) ×2 IMPLANT
PADDING CAST COTTON 4X4 STRL (CAST SUPPLIES) ×1
PADDING CAST COTTON 6X4 STRL (CAST SUPPLIES) ×3 IMPLANT
PIN GUIDE 3.2 903003004 (MISCELLANEOUS) ×3 IMPLANT
SCREW ACECAP 36MM (Screw) ×3 IMPLANT
SCREW ACECAP 42MM (Screw) ×3 IMPLANT
SCREW CANNULATED 5.0X42MM (Screw) ×3 IMPLANT
SCREW PROXIMAL DEPUY (Screw) ×2 IMPLANT
SCREW PRXML FT 45X5.5XLCK NS (Screw) ×2 IMPLANT
SCREW PRXML FT 60X5.5XNS LF (Screw) ×2 IMPLANT
SPONGE GAUZE 4X4 12PLY STER LF (GAUZE/BANDAGES/DRESSINGS) ×3 IMPLANT
SPONGE LAP 18X18 X RAY DECT (DISPOSABLE) ×3 IMPLANT
STAPLER VISISTAT 35W (STAPLE) ×3 IMPLANT
STOCKINETTE IMPERVIOUS LG (DRAPES) ×3 IMPLANT
STRIP CLOSURE SKIN 1/2X4 (GAUZE/BANDAGES/DRESSINGS) IMPLANT
SUCTION FRAZIER TIP 10 FR DISP (SUCTIONS) ×3 IMPLANT
SUT ETHILON 3 0 PS 1 (SUTURE) ×3 IMPLANT
SUT ETHILON 4 0 P 3 18 (SUTURE) IMPLANT
SUT ETHILON 5 0 P 3 18 (SUTURE)
SUT NYLON ETHILON 5-0 P-3 1X18 (SUTURE) IMPLANT
SUT PROLENE 4 0 P 3 18 (SUTURE) IMPLANT
SUT VIC AB 0 CT1 27 (SUTURE) ×1
SUT VIC AB 0 CT1 27XBRD ANBCTR (SUTURE) ×2 IMPLANT
SUT VIC AB 2-0 CT1 27 (SUTURE) ×1
SUT VIC AB 2-0 CT1 TAPERPNT 27 (SUTURE) ×2 IMPLANT
TOWEL OR 17X24 6PK STRL BLUE (TOWEL DISPOSABLE) ×3 IMPLANT
TOWEL OR 17X26 10 PK STRL BLUE (TOWEL DISPOSABLE) ×6 IMPLANT
TRAY FOLEY CATH 16FRSI W/METER (SET/KITS/TRAYS/PACK) IMPLANT
TUBE CONNECTING 12X1/4 (SUCTIONS) ×3 IMPLANT
WATER STERILE IRR 1000ML POUR (IV SOLUTION) IMPLANT
YANKAUER SUCT BULB TIP NO VENT (SUCTIONS) ×3 IMPLANT

## 2014-03-21 NOTE — Progress Notes (Signed)
Patient transferred to floor from PACU. Report received from Zella Ballobin, [place]. Patient stable.

## 2014-03-21 NOTE — Transfer of Care (Signed)
Immediate Anesthesia Transfer of Care Note  Patient: [name]  Procedure(s) Performed: Procedure(s): INTRAMEDULLARY (IM) NAIL TIBIAL (Right) OPEN REDUCTION INTERNAL FIXATION (ORIF) pilon  (Right)  Patient Location: PACU  Anesthesia Type:General  Level of Consciousness: awake, alert , oriented and patient cooperative  Airway & Oxygen Therapy: Patient Spontanous Breathing and Patient connected to nasal cannula oxygen  Post-op Assessment: Report given to PACU RN, Post -op Vital signs reviewed and stable, Patient moving all extremities and Patient moving all extremities X 4  Post vital signs: Reviewed and stable  Complications: No apparent anesthesia complications

## 2014-03-21 NOTE — H&P (View-Only) (Signed)
  [facility]   A Division of [facility]   [address], [place], [place] [address] Telephone: [phone]  Fax: [phone]     PATIENT: [name], [name]   MR#: [id number]  DOB: 02/17/1971   Visit Date: 03/13/2014     HISTORY OF PRESENT ILLNESS:  A 43-year-old male who speaks [name].  His English is not very good.  He slipped and fell down the steps.  Went to the emergency room where x-rays demonstrated a short oblique midshaft tibial fracture with distal tibial pilon fracture nondisplaced and a distal fibular fracture 7 to 8 cm above the fibula without significant displacement.  He has been in a short leg splint, on crutches.  Has pain medication.   PAST MEDICAL/SURGICAL HISTORY:  No previous surgeries.   SOCIAL HISTORY:  Nonsmoker.    FAMILY HISTORY:  Negative.   REVIEW OF SYSTEMS:  No history of rheumatologic conditions.   PHYSICAL EXAMINATION:  Heart regular.  Lungs clear to auscultation.  No wheezing.  Distal pulses intact.  Sensation in his toes intact.   ASSESSMENT/DIAGNOSIS:  Displaced tibia fracture, fibular fracture, and pilon fracture.  Pilon is in good position.  We could fix this with some percutaneous screw fixation and then proceed with tibial nail.  I discussed with him that if the fibular fracture does not displace, we would be able to avoid fixation since he would be nonweightbearing postop.  Questions elicited and answered.  He had some friends here who also had some English language skills.   PLAN:  Will proceed with surgery next week.  All questions answered.  He understands and requests to proceed.   For additional information please see handwritten notes, reports, orders and prescriptions in this chart.      Mark C. Yates, M.D.    Auto-Authenticated by Mark C. Yates, M.D.  MCY/lb DD: 03/13/2014  DT: 03/14/2014   

## 2014-03-21 NOTE — Progress Notes (Signed)
Called Interpreter a 2nd time for [name] from pharmacy to go over patient medication.

## 2014-03-21 NOTE — Interval H&P Note (Signed)
History and Physical Interval Note:  03/21/2014 2:14 PM  [name]  has presented today for surgery, with the diagnosis of Right Pilon Fracture, Right Distal Fibula Fracture, and Right Tibia Fracture  The various methods of treatment have been discussed with the patient and family. After consideration of risks, benefits and other options for treatment, the patient has consented to  Procedure(s): Percutaneous Screw Fixation Distal Tibia Pilon Fracture (Right) INTRAMEDULLARY (IM) NAIL TIBIAL (Right) OPEN REDUCTION INTERNAL FIXATION (ORIF) LATERAL MALLEOLUS (Right) as a surgical intervention .  The patient's history has been reviewed, patient examined, no change in status, stable for surgery.  I have reviewed the patient's chart and labs.  Questions were answered to the patient's satisfaction.     Bernhardt Riemenschneider C

## 2014-03-21 NOTE — Interval H&P Note (Signed)
History and Physical Interval Note:  03/21/2014 2:14 PM  [name]  has presented today for surgery, with the diagnosis of Right Pilon Fracture, Right Distal Fibula Fracture, and Right Tibia Fracture  The various methods of treatment have been discussed with the patient and family. After consideration of risks, benefits and other options for treatment, the patient has consented to  Procedure(s): Percutaneous Screw Fixation Distal Tibia Pilon Fracture (Right) INTRAMEDULLARY (IM) NAIL TIBIAL (Right) OPEN REDUCTION INTERNAL FIXATION (ORIF) LATERAL MALLEOLUS (Right) as a surgical intervention .  The patient's history has been reviewed, patient examined, no change in status, stable for surgery.  I have reviewed the patient's chart and labs.  Questions were answered to the patient's satisfaction.     YATES,MARK C  Positive Hx of HIV on Meds which he last took yesterday.

## 2014-03-21 NOTE — Anesthesia Procedure Notes (Addendum)
Anesthesia Regional Block:  Popliteal block  Pre-Anesthetic Checklist: ,, timeout performed, Correct Patient, Correct Site, Correct Laterality, Correct Procedure, Correct Position, site marked, Risks and benefits discussed,  Surgical consent,  Pre-op evaluation,  At surgeon's request and post-op pain management  Laterality: Right  Prep: chloraprep       Needles:  Injection technique: Single-shot  Needle Type: Echogenic Stimulator Needle     Needle Length: 9cm 9 cm Needle Gauge: 22 and 22 G    Additional Needles:  Procedures: ultrasound guided (picture in chart) Popliteal block Narrative:  Start time: 03/21/2014 2:45 PM End time: 03/21/2014 2:50 PM Injection made incrementally with aspirations every 5 mL.  Performed by: Personally   Additional Notes: 30 cc 0.5% Marcaine with 1:200 Epi injected easily   Procedure Name: Intubation Date/Time: 03/21/2014 3:09 PM Performed by: Adonis HousekeeperNGELL, Fraida Veldman M Pre-anesthesia Checklist: Patient identified, Emergency Drugs available, Suction available, Patient being monitored and Timeout performed Patient Re-evaluated:Patient Re-evaluated prior to inductionOxygen Delivery Method: Circle system utilized Preoxygenation: Pre-oxygenation with 100% oxygen Intubation Type: IV induction Ventilation: Mask ventilation without difficulty Laryngoscope Size: Mac and 3 Grade View: Grade II Tube type: Oral Tube size: 7.5 mm Number of attempts: 1 Airway Equipment and Method: Stylet Placement Confirmation: ETT inserted through vocal cords under direct vision,  positive ETCO2 and breath sounds checked- equal and bilateral Secured at: 23 cm Tube secured with: Tape Dental Injury: Teeth and Oropharynx as per pre-operative assessment

## 2014-03-21 NOTE — Anesthesia Postprocedure Evaluation (Signed)
  Anesthesia Post-op Note  Patient: [name]  Procedure(s) Performed: Procedure(s): INTRAMEDULLARY (IM) NAIL TIBIAL (Right) OPEN REDUCTION INTERNAL FIXATION (ORIF) pilon  (Right)  Patient Location: PACU  Anesthesia Type:GA combined with regional for post-op pain  Level of Consciousness: awake and alert   Airway and Oxygen Therapy: Patient Spontanous Breathing  Post-op Pain: none  Post-op Assessment: Post-op Vital signs reviewed  Post-op Vital Signs: Reviewed  Last Vitals:  Filed Vitals:   03/21/14 1730  BP: 124/75  Pulse: 60  Temp:   Resp: 18    Complications: No apparent anesthesia complications

## 2014-03-21 NOTE — Anesthesia Preprocedure Evaluation (Addendum)
Anesthesia Evaluation  Patient identified by MRN, date of birth, ID band Patient awake    Reviewed: Allergy & Precautions, NPO status , Patient's Chart, lab work & pertinent test results  Airway Mallampati: II  TM Distance: >3 FB Neck ROM: Full    Dental  (+) Teeth Intact, Dental Advisory Given   Pulmonary former smoker,  breath sounds clear to auscultation        Cardiovascular Rhythm:Regular Rate:Normal     Neuro/Psych    GI/Hepatic   Endo/Other    Renal/GU      Musculoskeletal   Abdominal   Peds  Hematology   Anesthesia Other Findings   Reproductive/Obstetrics                            Anesthesia Physical Anesthesia Plan  ASA: III  Anesthesia Plan: General   Post-op Pain Management:    Induction:   Airway Management Planned: Oral ETT  Additional Equipment:   Intra-op Plan:   Post-operative Plan:   Informed Consent: I have reviewed the patients History and Physical, chart, labs and discussed the procedure including the risks, benefits and alternatives for the proposed anesthesia with the patient or authorized representative who has indicated his/her understanding and acceptance.   Dental advisory given  Plan Discussed with: CRNA and Anesthesiologist  Anesthesia Plan Comments: (R. Pilon Fracture R. Distal tibia fracture HIV (+) on anti-retroviral therapy  Plan GA with oral ETT and popliteal block  Kipp Broodavid Capricia Serda, MD)        Anesthesia Quick Evaluation

## 2014-03-22 DIAGNOSIS — Z419 Encounter for procedure for purposes other than remedying health state, unspecified: Secondary | ICD-10-CM | POA: Insufficient documentation

## 2014-03-22 DIAGNOSIS — B20 Human immunodeficiency virus [HIV] disease: Secondary | ICD-10-CM

## 2014-03-22 DIAGNOSIS — Z8781 Personal history of (healed) traumatic fracture: Secondary | ICD-10-CM

## 2014-03-22 LAB — COMPREHENSIVE METABOLIC PANEL
ALK PHOS: 69 U/L (ref 39–117)
ALT: 12 U/L (ref 0–53)
ANION GAP: 8 (ref 5–15)
AST: 24 U/L (ref 0–37)
Albumin: 2.7 g/dL — ABNORMAL LOW (ref 3.5–5.2)
BUN: 15 mg/dL (ref 6–23)
CHLORIDE: 95 meq/L — AB (ref 96–112)
CO2: 28 mmol/L (ref 19–32)
Calcium: 8.7 mg/dL (ref 8.4–10.5)
Creatinine, Ser: 1.32 mg/dL (ref 0.50–1.35)
GFR calc non Af Amer: 65 mL/min — ABNORMAL LOW (ref >90)
GFR, EST AFRICAN AMERICAN: 75 mL/min — AB (ref >90)
GLUCOSE: 119 mg/dL — AB (ref 70–99)
Potassium: 4.3 mmol/L (ref 3.5–5.1)
Sodium: 131 mmol/L — ABNORMAL LOW (ref 135–145)
Total Bilirubin: 1.1 mg/dL (ref 0.3–1.2)
Total Protein: 8.7 g/dL — ABNORMAL HIGH (ref 6.0–8.3)

## 2014-03-22 LAB — URINALYSIS, ROUTINE W REFLEX MICROSCOPIC
BILIRUBIN URINE: NEGATIVE
GLUCOSE, UA: NEGATIVE mg/dL
HGB URINE DIPSTICK: NEGATIVE
Ketones, ur: NEGATIVE mg/dL
Leukocytes, UA: NEGATIVE
Nitrite: NEGATIVE
Protein, ur: NEGATIVE mg/dL
SPECIFIC GRAVITY, URINE: 1.016 (ref 1.005–1.030)
UROBILINOGEN UA: 1 mg/dL (ref 0.0–1.0)
pH: 6 (ref 5.0–8.0)

## 2014-03-22 LAB — RPR

## 2014-03-22 MED ORDER — DIPHENHYDRAMINE HCL 25 MG PO CAPS: 25.0000 mg | ORAL_CAPSULE | Freq: Every evening | ORAL | Status: DC | PRN

## 2014-03-22 MED ORDER — DARUNAVIR-COBICISTAT 800-150 MG PO TABS
1.0000 | ORAL_TABLET | Freq: Every day | ORAL | Status: DC
  Administered 2014-03-22 – 2014-03-24 (×3): 1 via ORAL
  Filled 2014-03-22 (×4): qty 1

## 2014-03-22 MED ORDER — TEMAZEPAM 15 MG PO CAPS
15.0000 mg | ORAL_CAPSULE | Freq: Every evening | ORAL | Status: DC | PRN
  Administered 2014-03-22: 30 mg via ORAL
  Filled 2014-03-22: qty 2

## 2014-03-22 NOTE — Progress Notes (Signed)
Subjective: C/o some leg pain.  Not able to sleep.    Objective: Vital signs in last 24 hours: Temp:  [97.8 F (36.6 C)-99.6 F (37.6 C)] 99 F (37.2 C) (01/07 0523) Pulse Rate:  [60-94] 78 (01/07 0523) Resp:  [12-25] 18 (01/07 0523) BP: (105-142)/(58-87) 117/74 mmHg (01/07 0523) SpO2:  [91 %-100 %] 96 % (01/07 0523) Weight:  [68.493 kg (151 lb)] 68.493 kg (151 lb) (01/06 1355)  Intake/Output from previous day: 01/06 0701 - 01/07 0700 In: 2227 [P.O.:440; I.V.:1787] Out: 900 [Urine:850; Blood:50] Intake/Output this shift:     Recent Labs  03/21/14 1323  HGB 13.9    Recent Labs  03/21/14 1323  WBC 4.4  RBC 4.09*  HCT 41.5  PLT 227    Recent Labs  03/21/14 1323  NA 136  K 4.5  CL 100  CO2 28  BUN 18  CREATININE 1.31  GLUCOSE 105*  CALCIUM 9.4    Recent Labs  03/21/14 1323  INR 1.15    Exam:  Dressing clean, dry and intact.  Moves toes well.    Assessment/Plan: Start PT. Give benadryl qhs prn sleep.     OWENS,JAMES M 03/22/2014, 8:26 AM

## 2014-03-22 NOTE — Progress Notes (Signed)
    [facility]  Patients correct mobile number  [phone]

## 2014-03-22 NOTE — Op Note (Signed)
[name], [name] NO.:  [id number]  MEDICAL RECORD NO.:  [id number]  LOCATION:  6N29C                        FACILITY:  [facility]  PHYSICIAN:  [name], M.D.    DATE OF BIRTH:  January 26, 1971  DATE OF PROCEDURE: DATE OF DISCHARGE:                              OPERATIVE REPORT   PREOPERATIVE DIAGNOSES:  Right tibia-fibula fracture and right distal tibia intra-articular fracture (pilon fracture).  POSTOPERATIVE DIAGNOSES:  Right tibia-fibula fracture and right distal tibia intra-articular fracture (pilon fracture).  PROCEDURES:  Reduction and fixation of right pilon fracture, cannulated screw fixation.  Right tibial nail with proximal and distal interlock (fibular fracture did not require fixation).  SURGEON:  Anthony Kepple C. Ophelia Parsons, M.D.  ASSISTANT:  Anthony Churn. Barry Dienes, PA-C, medically necessary and present till hardware fixation was completed.  ANESTHESIA:  General.  TOURNIQUET TIME:  35 minutes.  ESTIMATED BLOOD LOSS:  100 mL.  INDICATIONS:  This is a 44 year old male, suffered fracture of the tibia when he slipped, fell on to stairs.  He was followed in the [facility], also has had hepatitis, he is [name] and his English was not very good. He had mid-to-distal shaft tibia fracture, short oblique, also had minimally-displaced intra-articular distal tibia fracture involving the lateral aspect of the tibia and a nondisplaced comminuted fracture of the fibula.  DESCRIPTION OF PROCEDURE:  After induction of anesthesia, proximal thigh tourniquet, standard prepping and draping with DuraPrep, usual extremity sheets, drapes were applied, the patient was on the flat [place] table. Tibia was fixed first in order to prevent displacement of the intra- articular fracture.  Under C-arm visualization, markings were made on the skin at the appropriate level and small incision was made anterolaterally, blunt spreading and then with pressure applied using closed Kelly, the  fracture was pushed, reduced and K-wire was drilled across.  Titanium 5-mm cannulated screw was selected and under visualization with the fracture reduced, K-wire was advanced, cancellous lag screw cannulated, reducing the fracture, tightened down securely and it was in anatomic position under x-ray.  After irrigation, nylon sutures were placed in the skin for closure.  The patient was then placed on a triangle with careful padding in the popliteal region. Sterile skin marker was used.  The leg was wrapped in an Esmarch, tourniquet inflated.  Incision was made adjacent to the patellar tendon. Proximal tibia was identified and just over the anterior lip, K-wire was drilled, checked under C-arm, overreamed and then the ball-tip rod with traction applied by Anthony Kief, PA-C with distraction, care taken not to displace the fibula.  The ball tip was advanced.  A 39-mm rod was selected, this cut down close to the ankle, but was still above the screw, holding the intra-articular fracture in anatomic position. Sequential reaming up to 11 and then 10-mm rod was advanced, which held the fracture tight, reduced.  Two proximal interlocks were placed with a guide and two obliques, checked under fluoroscopy and then two interlocks were down with freehand technique, 38-42 mm.  Spot pictures were taken showing the fracture reduced, interlocks in good position. Lavage with irrigation, bulb syringe, closure of the superficial retinaculum in the knee with 0 Vicryl, 2-0 Vicryl in the subcutaneous  tissue, skin staple closure of stab incisions and then Xeroform, 4x4s, large cotton wrap and Coban.  Instrument count and needle count were correct.     Anthony Wayment C. Ophelia CharterYates, M.D.     [name]  D:  03/21/2014  T:  03/22/2014  Job:  [id number]

## 2014-03-22 NOTE — Consult Note (Addendum)
[facility]    Date of Admission:  03/21/2014  Date of Consult:  03/22/2014  Reason for Consult: HIV care Referring Physician: Dr. Lorin Mercy (Dr Baxter Flattery had asked me to check in on her patient who had not been seen in our clinic for several months)   HPI: [name] is an 43 y.o. male African with HIV and AIDS currently on a salvage regimen of PREZCOBIX AZT and TIVICAY.  He established care with [place] in July 2015 and is followed by Dr. Baxter Flattery.   In September he had shown perfect virological control with a viral load that was undetectable. CD4 count however is still not come up much and he had remained on PCP and Michael Bactrim avium prophylaxis. He recently had a fall and slipped and suffered fracture of his lower extremity has now undergone surgery by orthopedics.  I was asked by Dr. Baxter Flattery check in on the patient recheck labs and make sure the patient is taking his meds and make sure he is plugged back into our clinic. It appears that we had the wrong phone number on file to him as he now has a new mobile cell phone.   Past Medical History  Diagnosis Date  . HIV disease   . TB (pulmonary tuberculosis)     previously treated according to refugee documentation  . Hepatitis     "I don't know what hepatitis I have"  . Depression     "stress and depression for any man is common" (03/21/2014)    Past Surgical History  Procedure Laterality Date  . Im nailing tibia Right 03/21/2014  . Orif ankle fracture Right 03/21/2014    lateral malleolus/notes 03/21/2014  . Fracture surgery    ergies:   No Known Allergies   Medications: I have reviewed patients current medications as documented in Epic Anti-infectives    Start     Dose/Rate Route Frequency Ordered Stop   03/22/14 1000  azithromycin (ZITHROMAX) tablet 1,200 mg     1,200 mg Oral Every Thu 03/21/14 1824     03/22/14 0845  darunavir-cobicistat (PREZCOBIX) 800-150 MG per tablet 1 tablet     1 tablet Oral  Daily with breakfast 03/22/14 0830     03/21/14 2200  zidovudine (RETROVIR) capsule 300 mg     300 mg Oral 2 times daily 03/21/14 1824     03/21/14 2000  sulfamethoxazole-trimethoprim (BACTRIM,SEPTRA) 400-80 MG per tablet 1 tablet     1 tablet Oral Daily 03/21/14 1824     03/21/14 2000  levofloxacin (LEVAQUIN) tablet 750 mg     750 mg Oral Daily 03/21/14 1824     03/21/14 2000  darunavir-cobicistat (PREZCOBIX) 800-150 MG per tablet 1 tablet  Status:  Discontinued     1 tablet Oral Daily 03/21/14 1824 03/22/14 0830   03/21/14 2000  dolutegravir (TIVICAY) tablet 50 mg     50 mg Oral Daily 03/21/14 1824        Social History:  reports that he has quit smoking. His smoking use included Cigarettes. He has a 22 pack-year smoking history. He has never used smokeless tobacco. He reports that he does not drink alcohol or use illicit drugs.  History reviewed. No pertinent family history.  As in HPI and primary teams notes otherwise 12 point review of systems is negative  Blood pressure 117/74, pulse 78, temperature 99 F (37.2 C), temperature source Oral, resp. rate 18, height _0  (1.88 m), weight 151 lb (68.493  kg), SpO2 96 %. General: Alert and awake, oriented x3, not in any acute distress. HEENT: anicteric sclera,  EOMI, oropharynx clear and without exudate CVS regular rate, normal  Chest:  no wheezing, no respiratory distress Abdomen: soft  nondistended,  Extremities: Left lower leg bandaged  Neuro: nonfocal, strength    Results for orders placed or performed during the hospital encounter of 03/21/14 (from the past 48 hour(s))  CBC     Status: Abnormal   Collection Time: 03/21/14  1:23 PM  Result Value Ref Range   WBC 4.4 4.0 - 10.5 K/uL   RBC 4.09 (L) 4.22 - 5.81 MIL/uL   Hemoglobin 13.9 13.0 - 17.0 g/dL   HCT 41.5 39.0 - 52.0 %   MCV 101.5 (H) 78.0 - 100.0 fL   MCH 34.0 26.0 - 34.0 pg   MCHC 33.5 30.0 - 36.0 g/dL   RDW 11.7 11.5 - 15.5 %   Platelets 227 150 - 400 K/uL    Comprehensive metabolic panel     Status: Abnormal   Collection Time: 03/21/14  1:23 PM  Result Value Ref Range   Sodium 136 135 - 145 mmol/L    Comment: Please note change in reference range.   Potassium 4.5 3.5 - 5.1 mmol/L    Comment: Please note change in reference range.   Chloride 100 96 - 112 mEq/L   CO2 28 19 - 32 mmol/L   Glucose, Bld 105 (H) 70 - 99 mg/dL   BUN 18 6 - 23 mg/dL   Creatinine, Ser 1.31 0.50 - 1.35 mg/dL   Calcium 9.4 8.4 - 10.5 mg/dL   Total Protein 9.7 (H) 6.0 - 8.3 g/dL   Albumin 3.2 (L) 3.5 - 5.2 g/dL   AST 32 0 - 37 U/L   ALT 12 0 - 53 U/L   Alkaline Phosphatase 72 39 - 117 U/L   Total Bilirubin 0.5 0.3 - 1.2 mg/dL   GFR calc non Af Amer 65 (L) >90 mL/min   GFR calc Af Amer 76 (L) >90 mL/min    Comment: (NOTE) The eGFR has been calculated using the CKD EPI equation. This calculation has not been validated in all clinical situations. eGFR's persistently <90 mL/min signify possible Chronic Kidney Disease.    Anion gap 8 5 - 15  Protime-INR     Status: None   Collection Time: 03/21/14  1:23 PM  Result Value Ref Range   Prothrombin Time 14.8 11.6 - 15.2 seconds   INR 1.15 0.00 - 1.49  Urinalysis, Routine w reflex microscopic     Status: None   Collection Time: 03/22/14  5:48 AM  Result Value Ref Range   Color, Urine YELLOW YELLOW   APPearance CLEAR CLEAR   Specific Gravity, Urine 1.016 1.005 - 1.030   pH 6.0 5.0 - 8.0   Glucose, UA NEGATIVE NEGATIVE mg/dL   Hgb urine dipstick NEGATIVE NEGATIVE   Bilirubin Urine NEGATIVE NEGATIVE   Ketones, ur NEGATIVE NEGATIVE mg/dL   Protein, ur NEGATIVE NEGATIVE mg/dL   Urobilinogen, UA 1.0 0.0 - 1.0 mg/dL   Nitrite NEGATIVE NEGATIVE   Leukocytes, UA NEGATIVE NEGATIVE    Comment: MICROSCOPIC NOT DONE ON URINES WITH NEGATIVE PROTEIN, BLOOD, LEUKOCYTES, NITRITE, OR GLUCOSE <1000 mg/dL.  Comprehensive metabolic panel     Status: Abnormal   Collection Time: 03/22/14  6:53 AM  Result Value Ref Range   Sodium  131 (L) 135 - 145 mmol/L    Comment: Please note change in reference range.  Potassium 4.3 3.5 - 5.1 mmol/L    Comment: Please note change in reference range.   Chloride 95 (L) 96 - 112 mEq/L   CO2 28 19 - 32 mmol/L   Glucose, Bld 119 (H) 70 - 99 mg/dL   BUN 15 6 - 23 mg/dL   Creatinine, Ser 1.32 0.50 - 1.35 mg/dL   Calcium 8.7 8.4 - 10.5 mg/dL   Total Protein 8.7 (H) 6.0 - 8.3 g/dL   Albumin 2.7 (L) 3.5 - 5.2 g/dL   AST 24 0 - 37 U/L   ALT 12 0 - 53 U/L   Alkaline Phosphatase 69 39 - 117 U/L   Total Bilirubin 1.1 0.3 - 1.2 mg/dL   GFR calc non Af Amer 65 (L) >90 mL/min   GFR calc Af Amer 75 (L) >90 mL/min    Comment: (NOTE) The eGFR has been calculated using the CKD EPI equation. This calculation has not been validated in all clinical situations. eGFR's persistently <90 mL/min signify possible Chronic Kidney Disease.    Anion gap 8 5 - 15   _0 (sdes,specrequest,cult,reptstatus)   )No results found for this or any previous visit (from the past 720 hour(s)).   Impression/Recommendation  Active Problems:   S/P ORIF (open reduction internal fixation) fracture   [name] is a 44 y.o. male with  HIV and AIDS, who appears by his story to be taking meds and labs in September bore this out, not seen since then by [place] now with fracture sp ORIF  #1 HIV:  I had extensive conversation with the patient about his antiretrovirals and he was able to accurately describe all them via a Advertising account planner using telephonic translation  Continue PREZCOBIX TIVICAY and Truvada  I will check a viral load and CD4 count here.  In the meantime continue prophylactic Bactrim and azithromycin  I do not see why he needs levofloxacin this appears to be a carryover from an antibiotic prescribed by Dr. Baxter Flattery a while back. Patient already received his proper preoperative antibiotics with cefazolin I will discontinue levofloxacin  I spent greater than 40 minutes with the patient  including greater than 50% of time in face to face counsel of the patient and in coordination of their care.   #2 ORIF: greatly appreciate Dr. Lorin Mercy care of this patient. We want to make sure we arrange HSFU whether he goes home or to SNF  I will check in on him again tomorrow.     03/22/2014, 12:18 PM   Thank you so much for this interesting consult  [facility] [facility] [place] [phone] (pager) [phone] (office) 03/22/2014, 12:18 PM  Rhina [place] 03/22/2014, 12:18 PM

## 2014-03-23 ENCOUNTER — Encounter (HOSPITAL_COMMUNITY): Payer: Self-pay | Admitting: Orthopaedic Surgery

## 2014-03-23 DIAGNOSIS — R944 Abnormal results of kidney function studies: Secondary | ICD-10-CM

## 2014-03-23 DIAGNOSIS — N289 Disorder of kidney and ureter, unspecified: Secondary | ICD-10-CM | POA: Insufficient documentation

## 2014-03-23 LAB — URINALYSIS, ROUTINE W REFLEX MICROSCOPIC
Bilirubin Urine: NEGATIVE
Glucose, UA: NEGATIVE mg/dL
Hgb urine dipstick: NEGATIVE
Ketones, ur: NEGATIVE mg/dL
Leukocytes, UA: NEGATIVE
Nitrite: NEGATIVE
Protein, ur: NEGATIVE mg/dL
Specific Gravity, Urine: 1.017 (ref 1.005–1.030)
Urobilinogen, UA: 1 mg/dL (ref 0.0–1.0)
pH: 6.5 (ref 5.0–8.0)

## 2014-03-23 LAB — COMPREHENSIVE METABOLIC PANEL
ALT: 10 U/L (ref 0–53)
AST: 23 U/L (ref 0–37)
Albumin: 2.7 g/dL — ABNORMAL LOW (ref 3.5–5.2)
Alkaline Phosphatase: 69 U/L (ref 39–117)
Anion gap: 6 (ref 5–15)
BUN: 15 mg/dL (ref 6–23)
CO2: 28 mmol/L (ref 19–32)
Calcium: 9 mg/dL (ref 8.4–10.5)
Chloride: 98 mEq/L (ref 96–112)
Creatinine, Ser: 1.45 mg/dL — ABNORMAL HIGH (ref 0.50–1.35)
GFR calc Af Amer: 67 mL/min — ABNORMAL LOW (ref >90)
GFR calc non Af Amer: 58 mL/min — ABNORMAL LOW (ref >90)
Glucose, Bld: 106 mg/dL — ABNORMAL HIGH (ref 70–99)
Potassium: 4.7 mmol/L (ref 3.5–5.1)
Sodium: 132 mmol/L — ABNORMAL LOW (ref 135–145)
Total Bilirubin: 1 mg/dL (ref 0.3–1.2)
Total Protein: 8.4 g/dL — ABNORMAL HIGH (ref 6.0–8.3)

## 2014-03-23 LAB — LIPID PANEL
Cholesterol: 121 mg/dL (ref 0–200)
HDL: 37 mg/dL — ABNORMAL LOW (ref >39)
LDL Cholesterol: 79 mg/dL (ref 0–99)
Total CHOL/HDL Ratio: 3.3 RATIO
Triglycerides: 27 mg/dL (ref <150)
VLDL: 5 mg/dL (ref 0–40)

## 2014-03-23 LAB — T-HELPER CELLS (CD4) COUNT (NOT AT ARMC)
CD4 T CELL ABS: 60 /uL — AB (ref 400–2700)
CD4 T CELL HELPER: 6 % — AB (ref 33–55)

## 2014-03-23 LAB — CREATININE, URINE, RANDOM: Creatinine, Urine: 102.5 mg/dL

## 2014-03-23 LAB — HIV-1 RNA ULTRAQUANT REFLEX TO GENTYP+
HIV 1 RNA Quant: 4634 copies/mL — ABNORMAL HIGH (ref <20)
HIV-1 RNA QUANT, LOG: 3.67 {Log} — AB (ref <1.30)

## 2014-03-23 LAB — SODIUM, URINE, RANDOM: Sodium, Ur: 63 mmol/L

## 2014-03-23 MED ORDER — METHOCARBAMOL 500 MG PO TABS: 500.0000 mg | ORAL_TABLET | Freq: Four times a day (QID) | ORAL | Status: DC | PRN

## 2014-03-23 MED ORDER — OXYCODONE-ACETAMINOPHEN 10-325 MG PO TABS: 1.0000 | ORAL_TABLET | Freq: Four times a day (QID) | ORAL | Status: DC | PRN

## 2014-03-23 MED ORDER — SODIUM CHLORIDE 0.9 % IV BOLUS (SEPSIS)
1000.0000 mL | Freq: Once | INTRAVENOUS | Status: AC
  Administered 2014-03-23: 1000 mL via INTRAVENOUS

## 2014-03-23 MED ORDER — ONDANSETRON 4 MG PO TBDP: 4.0000 mg | ORAL_TABLET | Freq: Three times a day (TID) | ORAL | Status: DC | PRN

## 2014-03-23 NOTE — Progress Notes (Signed)
[facility]    Subjective: No new complaints, slept well   Antibiotics:  Anti-infectives    Start     Dose/Rate Route Frequency Ordered Stop   03/22/14 1000  azithromycin (ZITHROMAX) tablet 1,200 mg     1,200 mg Oral Every Thu 03/21/14 1824     03/22/14 0845  darunavir-cobicistat (PREZCOBIX) 800-150 MG per tablet 1 tablet     1 tablet Oral Daily with breakfast 03/22/14 0830     03/21/14 2200  zidovudine (RETROVIR) capsule 300 mg     300 mg Oral 2 times daily 03/21/14 1824     03/21/14 2000  sulfamethoxazole-trimethoprim (BACTRIM,SEPTRA) 400-80 MG per tablet 1 tablet     1 tablet Oral Daily 03/21/14 1824     03/21/14 2000  levofloxacin (LEVAQUIN) tablet 750 mg  Status:  Discontinued     750 mg Oral Daily 03/21/14 1824 03/22/14 1225   03/21/14 2000  darunavir-cobicistat (PREZCOBIX) 800-150 MG per tablet 1 tablet  Status:  Discontinued     1 tablet Oral Daily 03/21/14 1824 03/22/14 0830   03/21/14 2000  dolutegravir (TIVICAY) tablet 50 mg     50 mg Oral Daily 03/21/14 1824        Medications: Scheduled Meds: . azithromycin  1,200 mg Oral Q Thu  . darunavir-cobicistat  1 tablet Oral Q breakfast  . docusate sodium  100 mg Oral BID  . dolutegravir  50 mg Oral Daily  . sodium chloride  1,000 mL Intravenous Once  . sulfamethoxazole-trimethoprim  1 tablet Oral Daily  . zidovudine  300 mg Oral BID   Continuous Infusions: . 0.45 % NaCl with KCl 20 mEq / L 90 mL/hr at 03/22/14 1723  . lactated ringers 10 mL/hr at 03/21/14 1353   PRN Meds:.diphenhydrAMINE, guaiFENesin-dextromethorphan, HYDROmorphone (DILAUDID) injection, methocarbamol **OR** methocarbamol (ROBAXIN)  IV, metoCLOPramide **OR** metoCLOPramide (REGLAN) injection, ondansetron **OR** ondansetron (ZOFRAN) IV, oxyCODONE, oxyCODONE-acetaminophen, senna-docusate, temazepam    Objective: Weight change:   Intake/Output Summary (Last 24 hours) at 03/23/14 1635 Last data filed at 03/23/14 6962  Gross per 24 hour  Intake      0 ml  Output   1600 ml  Net  -1600 ml   Blood pressure 104/67, pulse 91, temperature 98.5 F (36.9 C), temperature source Oral, resp. rate 16, height  (1.88 m), weight 151 lb (68.493 kg), SpO2 93 %. Temp:  [98.5 F (36.9 C)-99.9 F (37.7 C)] 98.5 F (36.9 C) (01/08 1251) Pulse Rate:  [83-107] 91 (01/08 1251) Resp:  [15-19] 16 (01/08 1251) BP: (102-129)/(63-78) 104/67 mmHg (01/08 1251) SpO2:  [93 %-97 %] 93 % (01/08 1251)  Physical Exam: General: Alert and awake, oriented x3, not in any acute distress. HEENT: anicteric sclera, EOMI, oropharynx clear and without exudate CVS regular rate, normal  Chest: no wheezing, no respiratory distress Abdomen: soft nondistended,  Extremities: right lower leg bandaged Neuro: nonfocal, strength   CBC:  CBC Latest Ref Rng 03/21/2014 12/11/2013 10/26/2013  WBC 4.0 - 10.5 K/uL 4.4 3.1(L) 2.8(L)  Hemoglobin 13.0 - 17.0 g/dL 95.2 84.1 11.7(L)  Hematocrit 39.0 - 52.0 % 41.5 38.4(L) 33.8(L)  Platelets 150 - 400 K/uL 227 182 141(L)      BMET  Recent Labs  03/22/14 0653 03/23/14 0603  NA 131* 132*  K 4.3 4.7  CL 95* 98  CO2 28 28  GLUCOSE 119* 106*  BUN 15 15  CREATININE 1.32 1.45*  CALCIUM 8.7 9.0     Liver Panel  Recent Labs  03/22/14 0653 03/23/14 0603  PROT 8.7* 8.4*  ALBUMIN 2.7* 2.7*  AST 24 23  ALT 12 10  ALKPHOS 69 69  BILITOT 1.1 1.0       Sedimentation Rate No results for input(s): ESRSEDRATE in the last 72 hours. C-Reactive Protein No results for input(s): CRP in the last 72 hours.  Micro Results: No results found for this or any previous visit (from the past 720 hour(s)).  Studies/Results: No results found.    Assessment/Plan:  Active Problems:   S/P ORIF (open reduction internal fixation) fracture   Surgery, elective   AIDS    [name] is a 44 y.o. male with HIV and AIDS, who appears by his story to be taking meds and the labs  September  bore this out, not seen since then by us now with fracture sp ORIF  #1 HIV: His CD4 is still only 60. Hopefully he is taking meds as he states he is and this is due to difficult IMMUNE RECONSTITUTION  --Continue PREZCOBIX TIVICAY and Truvada  Viral load pending  continue prophylactic Bactrim and azithromycin  #2 Worsening creatinine: I suspect may have some degree of prerenal insufficiency. Will check UA, lytes and give him a 1 L NS bolus, recheck BMP in am  A part of the elevated Cr may be due to DTG, COBI and TMP/SMX all inhibiting secretion of cr in distal tubules rather than actual glomerular filtration problems   #3  ORIF: greatly appreciate Dr. Ophelia CharterYates care of this patient. We want to make sure we arrange HSFU   HE HAS A HOSPITAL FOLLOWUP ON   February THE 4TH AT 9AM AT [facility] WITH DR. Drue SecondSNIDER   PLEASE LET HIM KNOW OF THIS APPT VIA TRANSLATOR PRIOR TO DISCHARGE  Dr. Orvan Falconerampbell available on the weekend for questions.    LOS: 2 days   [name] 03/23/2014, 4:35 PM

## 2014-03-23 NOTE — Progress Notes (Signed)
Subjective: Pain controlled.  Doing well.    Objective: Vital signs in last 24 hours: Temp:  [98.5 F (36.9 C)-99.9 F (37.7 C)] 98.5 F (36.9 C) (01/08 1251) Pulse Rate:  [83-107] 91 (01/08 1251) Resp:  [15-19] 16 (01/08 1251) BP: (102-129)/(63-78) 104/67 mmHg (01/08 1251) SpO2:  [93 %-97 %] 93 % (01/08 1251)  Intake/Output from previous day: 01/07 0701 - 01/08 0700 In: 480 [P.O.:480] Out: 1440 [Urine:1440] Intake/Output this shift: Total I/O In: -  Out: 500 [Urine:500]   Recent Labs  03/21/14 1323  HGB 13.9    Recent Labs  03/21/14 1323  WBC 4.4  RBC 4.09*  HCT 41.5  PLT 227    Recent Labs  03/22/14 0653 03/23/14 0603  NA 131* 132*  K 4.3 4.7  CL 95* 98  CO2 28 28  BUN 15 15  CREATININE 1.32 1.45*  GLUCOSE 119* 106*  CALCIUM 8.7 9.0    Recent Labs  03/21/14 1323  INR 1.15    Exam:  Dressing intact.  No drainage. Moves toes well.    Assessment/Plan: Anticipate d/c home tomorrow if continues to progress with PT.  Needs stair training.     Taraoluwa Thakur M 03/23/2014, 12:57 PM

## 2014-03-23 NOTE — Evaluation (Signed)
Physical Therapy Evaluation Patient Details Name: [name] MRN: [id number] DOB: 05/21/1970 Today's Date: 03/23/2014   History of Present Illness  A 44 year old male who speaks [name].  His English is not very good.  He slipped and fell down the steps.  Went to the emergency room where x-rays demonstrated a short oblique midshaft tibial fracture with distal tibial pilon fracture nondisplaced and a distal fibular fracture 7 to 8 cm above the fibula without significant displacement.  He has been in a short leg splint, on crutches.  Has pain medication.  Now is s/p IM nail and ORIF RLE and is WBAT  Clinical Impression  Pt presents with above.  Demonstrates increased pain, decreased balance, decreased endurance and mobility.  Tolerated OOB and short distance ambulation with RW at min A level.  Unable to ambulate more than 35' due to increased pain.  Feel that RW will be safer at time of D/C than crutches due to high level of pain and increase reliance on UE use.  Had long discussion with daughter regarding recommendation at home.  Feel that he will be safer not going upstairs initially and daughter states that he can sleep on couch downstairs for a few days.  Educated and demonstrated bedside commode to daughter and how he could use this by the couch until safe to ascend stairs and would also need to sponge bath.  PT recommend continued acute services to address deficits and recommend HHPT for follow up at D/C.  Explained and educated this to daughter and both her and pt verbalized understanding.     Follow Up Recommendations Home health PT;Supervision/Assistance - 24 hour    Equipment Recommendations  Rolling walker with 5" wheels;3in1 (PT);Wheelchair (measurements PT);Wheelchair cushion (measurements PT) (will likely need w/c with R elevating leg rest for community use (18x18))    Recommendations for Other Services OT consult     Precautions / Restrictions Precautions Precautions:  Fall Precaution Comments: R long leg cast Restrictions Weight Bearing Restrictions: No Other Position/Activity Restrictions: WBAT RLE      Mobility  Bed Mobility Overal bed mobility: Modified Independent             General bed mobility comments: Pt able to manage LE and trunk very well with HOB flat and without handrails.    Transfers Overall transfer level: Needs assistance Equipment used: Rolling walker (2 wheeled) Transfers: Sit to/from Stand Sit to Stand: Min assist         General transfer comment: Min A to rise and steady with mod verbal and demonstration cues for hand placement and safety when sitting/standing.   Ambulation/Gait Ambulation/Gait assistance: Min assist Ambulation Distance (Feet): 35 Feet Assistive device: Rolling walker (2 wheeled) Gait Pattern/deviations: Step-to pattern;Decreased stride length;Antalgic;Trunk flexed;Decreased weight shift to right Gait velocity: decreased   General Gait Details: Pt very limited by pain during gait and unable to ambulate more than 35' during evaluation.  Pt demonstrates mostly TDWB pattern, despite education on WBAT, however feel that it would be difficult to perform normal gait pattern due to pain and thickness of cast on R foot.   Stairs            Wheelchair Mobility    Modified Rankin (Stroke Patients Only)       Balance Overall balance assessment: Needs assistance Sitting-balance support: Single extremity supported;Feet supported Sitting balance-Leahy Scale: Good     Standing balance support: Bilateral upper extremity supported;During functional activity Standing balance-Leahy Scale: Fair Standing balance comment: Pt able  to stand with RW for static standing at S level with use of RW.                              Pertinent Vitals/Pain Pain Assessment: 0-10 Pain Score: 8  Pain Location: R leg Pain Descriptors / Indicators: Aching Pain Intervention(s): Monitored during  session;Repositioned;Patient requesting pain meds-RN notified    Home Living Family/patient expects to be discharged to:: Private residence Living Arrangements: Children Available Help at Discharge: Family;Available 24 hours/day Type of Home: Apartment Home Access: Level entry     Home Layout: Two level;Bed/bath upstairs Home Equipment: Crutches Additional Comments: Has been using single crutch and rail to get upstairs since accident, daughter having some trouble understanding English as well during questioning, please confirm.     Prior Function Level of Independence: Independent               Hand Dominance   Dominant Hand: Right    Extremity/Trunk Assessment               Lower Extremity Assessment: Overall WFL for tasks assessed (unable to fully assess RLE due to cast and pain)      Cervical / Trunk Assessment: Normal  Communication   Communication: Prefers language other than English (daughter in room to translate)  Cognition Arousal/Alertness: Awake/alert Behavior During Therapy: WFL for tasks assessed/performed Overall Cognitive Status: Within Functional Limits for tasks assessed                      General Comments General comments (skin integrity, edema, etc.): Note pt with hx of AIDs and HIV positive    Exercises        Assessment/Plan    PT Assessment Patient needs continued PT services  PT Diagnosis Difficulty walking;Abnormality of gait;Generalized weakness;Acute pain   PT Problem List Decreased strength;Decreased range of motion;Decreased activity tolerance;Decreased balance;Decreased mobility;Decreased knowledge of use of DME;Decreased knowledge of precautions;Pain  PT Treatment Interventions DME instruction;Gait training;Stair training;Functional mobility training;Therapeutic activities;Therapeutic exercise;Balance training;Patient/family education   PT Goals (Current goals can be found in the Care Plan section) Acute Rehab PT  Goals Patient Stated Goal: none stated PT Goal Formulation: With patient/family Time For Goal Achievement: 03/30/14 Potential to Achieve Goals: Good    Frequency Min 5X/week   Barriers to discharge Inaccessible home environment bedroom/bathroom on second floor    Co-evaluation               End of Session Equipment Utilized During Treatment: Other (comment) (soft brace on RLE) Activity Tolerance: Patient limited by pain Patient left: in chair;with call bell/phone within reach;with family/visitor present Nurse Communication: Mobility status;Precautions;Patient requests pain meds         Time: 1610-9604 PT Time Calculation (min) (ACUTE ONLY): 27 min   Charges:   PT Evaluation $Initial PT Evaluation Tier I: 1 Procedure PT Treatments $Gait Training: 8-22 mins $Self Care/Home Management: 8-22   PT G Codes:        Vista Deck 03/23/2014, 10:23 AM

## 2014-03-23 NOTE — Evaluation (Signed)
Occupational Therapy Evaluation Patient Details Name: [name] MRN: [id number] DOB: 16-Sep-1970 Today's Date: 03/23/2014    History of Present Illness A 44 year old male who speaks [name].  His English is not very good.  He slipped and fell down the steps.  Went to the emergency room where x-rays demonstrated a short oblique midshaft tibial fracture with distal tibial pilon fracture nondisplaced and a distal fibular fracture 7 to 8 cm above the fibula without significant displacement.  He has been in a short leg splint, on crutches.  Has pain medication.  Now is s/p IM nail and ORIF RLE and is WBAT   Clinical Impression   Pt currently at a supervision level for most selfcare tasks, and toileting using the RW for safety.  Discussed use of the walker as well as 3:1 and tub bench with pt and pt's daughter.  Recommended tub bench for home as pt will not be able to step over the tub or maintain standing safely long enough for this task.  Even though the tub is on the second level feel he will likely be able to negotiate steps with assistance within a week or so.  No follow-up post acute OT recommended.      Follow Up Recommendations  No OT follow up;Supervision/Assistance - 24 hour    Equipment Recommendations  3 in 1 bedside comode;Tub/shower bench       Precautions / Restrictions Precautions Precautions: Fall Restrictions Weight Bearing Restrictions: No Other Position/Activity Restrictions: WBAT RLE      Mobility Bed Mobility Overal bed mobility: Modified Independent             General bed mobility comments: Pt able to manage LE and trunk very well with HOB flat and without handrails.    Transfers Overall transfer level: Needs assistance Equipment used: Rolling walker (2 wheeled) Transfers: Sit to/from [employer] Sit to Stand: Supervision Stand pivot transfers: Supervision            Balance Overall balance assessment: Needs  assistance Sitting-balance support: No upper extremity supported Sitting balance-Leahy Scale: Good     Standing balance support: Bilateral upper extremity supported Standing balance-Leahy Scale: Fair                              ADL Overall ADL's : Needs assistance/impaired Eating/Feeding: Independent   Grooming: Wash/dry hands;Wash/dry face;Supervision/safety;Standing   Upper Body Bathing: Supervision/ safety;Sitting   Lower Body Bathing: Supervison/ safety;Sit to/from stand   Upper Body Dressing : Supervision/safety;Sitting   Lower Body Dressing: Supervision/safety;Sit to/from stand   Toilet Transfer: Supervision/safety;BSC;RW;Ambulation   Toileting- Architect and Hygiene: Supervision/safety       Functional mobility during ADLs: Supervision/safety General ADL Comments: Pt able to transfer with supervision and use of the RW.  Pt's daughter present for session and able to translate.  Discussed the need for pt to have supervision with all transfers.  Feel he will need a tub bench for home as well.  Even though tub is on the second floor feel based on his age and strength that he will be able to ambulate up the steps in a short period of time to reach the tub.  He will not however be able to step over the edge of the tub and maintain standing to complete his bath.          Perception Perception Perception Tested?: No   Praxis Praxis Praxis tested?: Within functional limits  Pertinent Vitals/Pain Pain Assessment: Faces Faces Pain Scale: Hurts little more Pain Location: RLE Pain Intervention(s): Monitored during session;Repositioned     Hand Dominance Right   Extremity/Trunk Assessment Upper Extremity Assessment Upper Extremity Assessment: Overall WFL for tasks assessed   Lower Extremity Assessment Lower Extremity Assessment: Defer to PT evaluation       Communication Communication Communication: Prefers language other than [place]  (daughter in room to translate)   Cognition Arousal/Alertness: Awake/alert Behavior During Therapy: WFL for tasks assessed/performed Overall Cognitive Status: Within Functional Limits for tasks assessed                                Home Living Family/patient expects to be discharged to:: Private residence Living Arrangements: Children Available Help at Discharge: Family;Available 24 hours/day Type of Home: Apartment Home Access: Level entry     Home Layout: Two level;Bed/bath upstairs Alternate Level Stairs-Number of Steps: 10 Alternate Level Stairs-Rails: Left Bathroom Shower/Tub: Tub/shower unit (on second level) Shower/tub characteristics: Curtain FirefighterBathroom Toilet: Standard Bathroom Accessibility: No   Home Equipment: Crutches   Additional Comments: Has been using single crutch and rail to get upstairs since accident, daughter having some trouble understanding English as well during questioning, please confirm.       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

## 2014-03-23 NOTE — Care Management Note (Addendum)
  Page 2 of 2   03/23/2014     1:22:16 PM CARE MANAGEMENT NOTE 03/23/2014  Patient:  [name],[name]   Account Number:  [id number]  Date Initiated:  03/23/2014  Documentation initiated by:  Ronny FlurryWILE,Tanaysia Bhardwaj  Subjective/Objective Assessment:     Action/Plan:   Anticipated DC Date:  03/24/2014   Anticipated DC Plan:  HOME W HOME HEALTH SERVICES         Choice offered to / List presented to:  C-4 Adult Children   DME arranged  3-N-1  WALKER - ROLLING  WHEELCHAIR - MANUAL      DME agency  [facility]     HH arranged  HH-2 PT      [name]   Status of service:  Completed, signed off Medicare Important Message given?   (If response is "NO", the following Medicare IM given date fields will be blank) Date Medicare IM given:   Medicare IM given by:   Date Additional Medicare IM given:   Additional Medicare IM given by:    Discharge Disposition:  HOME W HOME HEALTH SERVICES  Per UR Regulation:  Reviewed for med. necessity/level of care/duration of stay  If discussed at Long Length of Stay Meetings, dates discussed:    Comments: 03-23-14 Added tub bench to DME orders , [facility] aware. Ronny FlurryHeather Irena Gaydos RN BSN     03-23-14 Ordered Rolling walker with 5" wheels;3in1 (PT);Wheelchair (measurements PT);Wheelchair cushion (measurements PT) (will likely need w/c with R elevating leg rest for community use (18x18)) , through [facility] .  Ronny FlurryHeather Livi Mcgann RN BSN

## 2014-03-24 LAB — COMPREHENSIVE METABOLIC PANEL
ALK PHOS: 67 U/L (ref 39–117)
ALT: 11 U/L (ref 0–53)
AST: 20 U/L (ref 0–37)
Albumin: 2.6 g/dL — ABNORMAL LOW (ref 3.5–5.2)
Anion gap: 6 (ref 5–15)
BUN: 13 mg/dL (ref 6–23)
CO2: 27 mmol/L (ref 19–32)
CREATININE: 1.34 mg/dL (ref 0.50–1.35)
Calcium: 8.6 mg/dL (ref 8.4–10.5)
Chloride: 95 mEq/L — ABNORMAL LOW (ref 96–112)
GFR calc Af Amer: 74 mL/min — ABNORMAL LOW (ref >90)
GFR, EST NON AFRICAN AMERICAN: 64 mL/min — AB (ref >90)
GLUCOSE: 109 mg/dL — AB (ref 70–99)
Potassium: 3.7 mmol/L (ref 3.5–5.1)
Sodium: 128 mmol/L — ABNORMAL LOW (ref 135–145)
Total Bilirubin: 0.5 mg/dL (ref 0.3–1.2)
Total Protein: 9 g/dL — ABNORMAL HIGH (ref 6.0–8.3)

## 2014-03-24 LAB — GC/CHLAMYDIA PROBE AMP
CT Probe RNA: NEGATIVE
GC PROBE AMP APTIMA: NEGATIVE

## 2014-03-24 NOTE — Progress Notes (Addendum)
Discharge instructions gone over using interpretor. Prescriptions given. Home medications gone over. Follow up appointment to be made. Diet, activity, and incisional care gone over. Reasons to call 911 gone over. Reasons to call the doctor gone over. Free meals brochure gone over. Medical equipment to be delivered to patient's room prior to discharge. Patient will have home health physical therapist through [facility].  Patient acknowledged understanding of instructions using the interpretor.  03/24/14 14:30, Home care delivered walker to room and other equipment is being delivered to patient's home.

## 2014-03-24 NOTE — Progress Notes (Signed)
Subjective: Pt stable - pain ok   Objective: Vital signs in last 24 hours: Temp:  [98.5 F (36.9 C)-99.2 F (37.3 C)] 99.2 F (37.3 C) (01/09 0455) Pulse Rate:  [85-96] 91 (01/09 0455) Resp:  [15-17] 16 (01/09 0455) BP: (104-128)/(63-73) 128/65 mmHg (01/09 0455) SpO2:  [93 %-98 %] 98 % (01/09 0455)  Intake/Output from previous day: 01/08 0701 - 01/09 0700 In: 1080 [P.O.:360; I.V.:720] Out: 1850 [Urine:1850] Intake/Output this shift:    Exam:  Dorsiflexion/Plantar flexion intact No cellulitis present Compartment soft  Labs:  Recent Labs  03/21/14 1323  HGB 13.9    Recent Labs  03/21/14 1323  WBC 4.4  RBC 4.09*  HCT 41.5  PLT 227    Recent Labs  03/23/14 0603 03/24/14 0354  NA 132* 128*  K 4.7 3.7  CL 98 95*  CO2 28 27  BUN 15 13  CREATININE 1.45* 1.34  GLUCOSE 106* 109*  CALCIUM 9.0 8.6    Recent Labs  03/21/14 1323  INR 1.15    Assessment/Plan: Plan dc today or in am - PT and stair eval pending   Raela Bohl SCOTT 03/24/2014, 8:52 AM

## 2014-03-27 LAB — HEPATITIS C ANTIBODY (REFLEX): HCV AB: NEGATIVE

## 2014-03-30 LAB — HIV-1 INTEGRASE GENOTYPE
DATE VIRAL LOAD COLLECTED: NO GROWTH
VALUE LAST VIRAL LOAD: NO GROWTH

## 2014-04-02 NOTE — Discharge Summary (Signed)
Patient ID: Anthony IbaJean-Paul P Pape MRN: [id number] DOB/AGE: 43/03/1970 43 y.o.  Admit date: 03/21/2014 Discharge date: 04/02/2014  Admission Diagnoses:  Active Problems:   S/P ORIF (open reduction internal fixation) fracture   Surgery, elective   AIDS   Renal insufficiency   Discharge Diagnoses:  Active Problems:   S/P ORIF (open reduction internal fixation) fracture   Surgery, elective   AIDS   Renal insufficiency  status post Procedure(s): INTRAMEDULLARY (IM) NAIL TIBIAL OPEN REDUCTION INTERNAL FIXATION (ORIF) pilon   Past Medical History  Diagnosis Date  . HIV disease   . TB (pulmonary tuberculosis)     previously treated according to refugee documentation  . Hepatitis     "I don't know what hepatitis I have"  . Depression     "stress and depression for any man is common" (03/21/2014)    Surgeries: Procedure(s): INTRAMEDULLARY (IM) NAIL TIBIAL OPEN REDUCTION INTERNAL FIXATION (ORIF) pilon  on 03/21/2014   Consultants:    Discharged Condition: Improved  Hospital Course: [name] is an 44 y.o. male who was admitted 03/21/2014 for operative treatment of tibia fracture   Patient failed conservative treatments (please see the history and physical for the specifics) and had severe unremitting pain that affects sleep, daily activities and work/hobbies. After pre-op clearance, the patient was taken to the operating room on 03/21/2014 and underwent  Procedure(s): INTRAMEDULLARY (IM) NAIL TIBIAL OPEN REDUCTION INTERNAL FIXATION (ORIF) pilon .    Patient was given perioperative antibiotics:  Anti-infectives    Start     Dose/Rate Route Frequency Ordered Stop   03/22/14 1000  azithromycin (ZITHROMAX) tablet 1,200 mg  Status:  Discontinued     1,200 mg Oral Every Thu 03/21/14 1824 03/24/14 1739   03/22/14 0845  darunavir-cobicistat (PREZCOBIX) 800-150 MG per tablet 1 tablet  Status:  Discontinued     1 tablet Oral Daily with breakfast 03/22/14 0830 03/24/14 1739    03/21/14 2200  zidovudine (RETROVIR) capsule 300 mg  Status:  Discontinued     300 mg Oral 2 times daily 03/21/14 1824 03/24/14 1739   03/21/14 2000  sulfamethoxazole-trimethoprim (BACTRIM,SEPTRA) 400-80 MG per tablet 1 tablet  Status:  Discontinued     1 tablet Oral Daily 03/21/14 1824 03/24/14 1739   03/21/14 2000  levofloxacin (LEVAQUIN) tablet 750 mg  Status:  Discontinued     750 mg Oral Daily 03/21/14 1824 03/22/14 1225   03/21/14 2000  darunavir-cobicistat (PREZCOBIX) 800-150 MG per tablet 1 tablet  Status:  Discontinued     1 tablet Oral Daily 03/21/14 1824 03/22/14 0830   03/21/14 2000  dolutegravir (TIVICAY) tablet 50 mg  Status:  Discontinued     50 mg Oral Daily 03/21/14 1824 03/24/14 1739       Patient was given sequential compression devices and early ambulation to prevent DVT.   Patient benefited maximally from hospital stay and there were no complications. At the time of discharge, the patient was urinating/moving their bowels without difficulty, tolerating a regular diet, pain is controlled with oral pain medications and they have been cleared by PT/OT.   Recent vital signs: No data found.    Recent laboratory studies: No results for input(s): WBC, HGB, HCT, PLT, NA, K, CL, CO2, BUN, CREATININE, GLUCOSE, INR, CALCIUM in the last 72 hours.  Invalid input(s): PT, 2   Discharge Medications:     Medication List    STOP taking these medications        acetaminophen 325 MG tablet  Commonly known as:  TYLENOL     cyclobenzaprine 5 MG tablet  Commonly known as:  FLEXERIL     HYDROcodone-acetaminophen 5-325 MG per tablet  Commonly known as:  NORCO/VICODIN      TAKE these medications        azithromycin 600 MG tablet  Commonly known as:  ZITHROMAX  Take 2 tablets (1,200 mg total) by mouth once a week.     darunavir-cobicistat 800-150 MG per tablet  Commonly known as:  PREZCOBIX  Take 1 tablet by mouth daily. Swallow whole. Do NOT crush, break or chew tablets.  Take with food.     dolutegravir 50 MG tablet  Commonly known as:  TIVICAY  Take 1 tablet (50 mg total) by mouth daily.     guaiFENesin-dextromethorphan 100-10 MG/5ML syrup  Commonly known as:  ROBITUSSIN DM  Take 5 mLs by mouth every 4 (four) hours as needed for cough.     levofloxacin 750 MG tablet  Commonly known as:  LEVAQUIN  Take 1 tablet (750 mg total) by mouth daily.     methocarbamol 500 MG tablet  Commonly known as:  ROBAXIN  Take 1 tablet (500 mg total) by mouth every 6 (six) hours as needed for muscle spasms.     ondansetron 4 MG disintegrating tablet  Commonly known as:  ZOFRAN ODT  Take 1 tablet (4 mg total) by mouth every 8 (eight) hours as needed.     oxyCODONE-acetaminophen 10-325 MG per tablet  Commonly known as:  PERCOCET  Take 1 tablet by mouth every 6 (six) hours as needed.     sulfamethoxazole-trimethoprim 400-80 MG per tablet  Commonly known as:  BACTRIM  Take 1 tablet by mouth daily.     zidovudine 300 MG tablet  Commonly known as:  RETROVIR  Take 1 tablet (300 mg total) by mouth 2 (two) times daily.        Diagnostic Studies: Dg Tibia/fibula Right  03/21/2014   CLINICAL DATA:  ORIF of right tibia and fibula  EXAM: DG C-ARM 61-120 MIN; RIGHT TIBIA AND FIBULA - 2 VIEW  COMPARISON:  03/10/2014  FLUOROSCOPY TIME:  58 seconds  FINDINGS: The initial images show a compression screw traversing the distal tibia. A medullary rod was then placed in the tibia with proximal and distal fixation screws. The fracture fragments are in anatomic alignment.  IMPRESSION: Successful ORIF of distal right tibia.   Electronically Signed   By: Alcide Clever M.D.   On: 03/21/2014 16:47   Dg Tibia/fibula Right  03/10/2014   CLINICAL DATA:  Acute onset of right lower leg deformity and pain. Patient slipped down stairs and heard pop. Initial encounter.  EXAM: RIGHT TIBIA AND FIBULA - 2 VIEW  COMPARISON:  None.  FINDINGS: There is a displaced fracture involving the midshaft of the  tibia, with approximately 1/3 shaft width lateral and posterior displacement. A nondisplaced oblique fracture line is seen extending distally, without definite intra-articular extension.  There is also a nondisplaced oblique fracture line involving the distal fibular diaphysis, as well as a mildly comminuted fracture involving the proximal fibula.  Associated soft tissue swelling is noted. Trace knee joint fluid is within normal limits. The knee joint is grossly unremarkable. The ankle mortise is incompletely assessed, but appears grossly unremarkable.  IMPRESSION: 1. Displaced fracture involving the midshaft of the tibia, with 1/3 shaft width lateral and posterior displacement. Associated nondisplaced oblique fracture line extends distally, without definite intra-articular extension. 2. Nondisplaced oblique fracture line involving  the distal fibular diaphysis, and mildly comminuted fracture at the proximal fibula.   Electronically Signed   By: Roanna Raider M.D.   On: 03/10/2014 03:43   Dg Ankle Complete Right  03/10/2014   CLINICAL DATA:  Status post slip and fall down stairs. Right lower leg deformity and pain. Initial encounter.  EXAM: RIGHT ANKLE - COMPLETE 3+ VIEW  COMPARISON:  None.  FINDINGS: There are nondisplaced oblique fractures noted at the distal fibular diaphysis and extending along the mid to distal tibial diaphysis. The displaced mid tibial fracture is better characterized on concurrent tibia/fibula radiographs.  There is also a fracture extending across the medial aspect of the distal tibia, extending into the tibial plafond, with minimal displacement.  No ankle mortise widening is seen. Given the location of the distal tibial fracture, mild disruption of the interosseous space cannot be excluded. Mild soft tissue swelling is noted about the ankle.  IMPRESSION: 1. Minimally displaced fracture noted extending across the medial aspect of the distal tibia, extending into the tibial plafond; this  was not well seen on tibia/fibula radiographs. Given the location of this fracture, mild disruption of the interosseous space cannot be excluded. 2. Nondisplaced oblique fractures at the distal fibular diaphysis and extending along the mid to distal tibial diaphysis, as characterized on tibia/fibula radiographs.   Electronically Signed   By: Roanna Raider M.D.   On: 03/10/2014 03:57   Dg C-arm 1-60 Min  03/21/2014   CLINICAL DATA:  ORIF of right tibia and fibula  EXAM: DG C-ARM 61-120 MIN; RIGHT TIBIA AND FIBULA - 2 VIEW  COMPARISON:  03/10/2014  FLUOROSCOPY TIME:  58 seconds  FINDINGS: The initial images show a compression screw traversing the distal tibia. A medullary rod was then placed in the tibia with proximal and distal fixation screws. The fracture fragments are in anatomic alignment.  IMPRESSION: Successful ORIF of distal right tibia.   Electronically Signed   By: Alcide Clever M.D.   On: 03/21/2014 16:47        Discharge Instructions    Call MD / Call 911    Complete by:  As directed   If you experience chest pain or shortness of breath, CALL 911 and be transported to the hospital emergency room.  If you develope a fever above 101 F, pus (white drainage) or increased drainage or redness at the wound, or calf pain, call your surgeon's office.     Call MD / Call 911    Complete by:  As directed   If you experience chest pain or shortness of breath, CALL 911 and be transported to the hospital emergency room.  If you develope a fever above 101 F, pus (white drainage) or increased drainage or redness at the wound, or calf pain, call your surgeon's office.     Constipation Prevention    Complete by:  As directed   Drink plenty of fluids.  Prune juice may be helpful.  You may use a stool softener, such as Colace (over the counter) 100 mg twice a day.  Use MiraLax (over the counter) for constipation as needed.     Constipation Prevention    Complete by:  As directed   Drink plenty of fluids.   Prune juice may be helpful.  You may use a stool softener, such as Colace (over the counter) 100 mg twice a day.  Use MiraLax (over the counter) for constipation as needed.     Diet - low sodium heart healthy  Complete by:  As directed      Diet - low sodium heart healthy    Complete by:  As directed      Discharge instructions    Complete by:  As directed   Do not remove dressing or get wet.  Touch down weightbearing.  Elevate foot above heart level as much as possible.     Driving restrictions    Complete by:  As directed   No driving     Increase activity slowly as tolerated    Complete by:  As directed      Increase activity slowly as tolerated    Complete by:  As directed            Follow-up Information    Follow up with Eldred Manges, MD.   Specialty:  Orthopedic Surgery   Why:  needs return office visit one week.     Contact information:   [address] [place] [address] [phone]       Discharge Plan:  discharge to home      Signed: Naida Sleight

## 2014-04-19 ENCOUNTER — Ambulatory Visit (INDEPENDENT_AMBULATORY_CARE_PROVIDER_SITE_OTHER): Payer: Medicaid Other | Admitting: Internal Medicine

## 2014-04-19 ENCOUNTER — Encounter: Payer: Self-pay | Admitting: Internal Medicine

## 2014-04-19 VITALS — BP 129/79 | HR 70 | Temp 97.9°F | Ht 73.0 in | Wt 162.0 lb

## 2014-04-19 DIAGNOSIS — B20 Human immunodeficiency virus [HIV] disease: Secondary | ICD-10-CM

## 2014-04-19 LAB — BASIC METABOLIC PANEL WITH GFR
BUN: 16 mg/dL (ref 6–23)
CHLORIDE: 99 meq/L (ref 96–112)
CO2: 32 mEq/L (ref 19–32)
Calcium: 9.7 mg/dL (ref 8.4–10.5)
Creat: 1.18 mg/dL (ref 0.50–1.35)
GFR, EST AFRICAN AMERICAN: 87 mL/min
GFR, EST NON AFRICAN AMERICAN: 75 mL/min
GLUCOSE: 103 mg/dL — AB (ref 70–99)
Potassium: 5.2 mEq/L (ref 3.5–5.3)
Sodium: 138 mEq/L (ref 135–145)

## 2014-04-19 MED ORDER — RITONAVIR 100 MG PO TABS: 100.0000 mg | ORAL_TABLET | Freq: Two times a day (BID) | ORAL | Status: DC

## 2014-04-19 MED ORDER — DARUNAVIR ETHANOLATE 600 MG PO TABS: 600.0000 mg | ORAL_TABLET | Freq: Two times a day (BID) | ORAL | Status: DC

## 2014-04-19 MED ORDER — DOLUTEGRAVIR SODIUM 50 MG PO TABS: 50.0000 mg | ORAL_TABLET | Freq: Two times a day (BID) | ORAL | Status: DC

## 2014-04-19 NOTE — Progress Notes (Signed)
Patient ID: [name], male   DOB: November 01, 1970, 44 y.o.   MRN: [id number]       Patient ID: [name], male   DOB: [birth year], 44 y.o.   MRN: [id number]  HPI 44yo M originally from [place], speaks swahili/french. Cd 4 count of 60/VL 4,634 (in January 2016) previously was undetectable. Currently on DLG/DRVc/ZDV. He is able to report what he takes and proper schedule. He is also on oi proph of bactrim and azithromycin. He was hospitalized for fibular and pilon fracture of right lower leg. Doing well since surgery. Wearing brace. No pain in ankle, well healed.   Outpatient Encounter Prescriptions as of 04/19/2014  Medication Sig  . azithromycin (ZITHROMAX) 600 MG tablet Take 2 tablets (1,200 mg total) by mouth once a week.  . darunavir-cobicistat (PREZCOBIX) 800-150 MG per tablet Take 1 tablet by mouth daily. Swallow whole. Do NOT crush, break or chew tablets. Take with food.  . dolutegravir (TIVICAY) 50 MG tablet Take 1 tablet (50 mg total) by mouth daily.  Marland Kitchen guaiFENesin-dextromethorphan (ROBITUSSIN DM) 100-10 MG/5ML syrup Take 5 mLs by mouth every 4 (four) hours as needed for cough.  . methocarbamol (ROBAXIN) 500 MG tablet Take 1 tablet (500 mg total) by mouth every 6 (six) hours as needed for muscle spasms.  . ondansetron (ZOFRAN ODT) 4 MG disintegrating tablet Take 1 tablet (4 mg total) by mouth every 8 (eight) hours as needed.  Marland Kitchen oxyCODONE-acetaminophen (PERCOCET) 10-325 MG per tablet Take 1 tablet by mouth every 6 (six) hours as needed.  . sulfamethoxazole-trimethoprim (BACTRIM) 400-80 MG per tablet Take 1 tablet by mouth daily.  . zidovudine (RETROVIR) 300 MG tablet Take 1 tablet (300 mg total) by mouth 2 (two) times daily.  . [DISCONTINUED] levofloxacin (LEVAQUIN) 750 MG tablet Take 1 tablet (750 mg total) by mouth daily.     Patient Active Problem List   Diagnosis Date Noted  . Renal insufficiency   . Surgery, elective   . AIDS   . S/P ORIF (open reduction internal  fixation) fracture 03/21/2014  . Low back pain 10/10/2013  . CAP (community acquired pneumonia) 09/27/2013  . HIV disease 09/18/2013  . Refugee health examination 09/18/2013  . Cough 09/18/2013     Health Maintenance Due  Topic Date Due  . TETANUS/TDAP  03/16/1990     Review of Systems Review of Systems  Constitutional: Negative for fever, chills, diaphoresis, activity change, appetite change, fatigue and unexpected weight change.  HENT: Negative for congestion, sore throat, rhinorrhea, sneezing, trouble swallowing and sinus pressure.  Eyes: Negative for photophobia and visual disturbance.  Respiratory: Negative for cough, chest tightness, shortness of breath, wheezing and stridor.  Cardiovascular: Negative for chest pain, palpitations and leg swelling.  Gastrointestinal: Negative for nausea, vomiting, abdominal pain, diarrhea, constipation, blood in stool, abdominal distention and anal bleeding.  Genitourinary: Negative for dysuria, hematuria, flank pain and difficulty urinating.  Musculoskeletal: Negative for myalgias, back pain, joint swelling, arthralgias and gait problem.  Skin: Negative for color change, pallor, rash and wound.  Neurological: Negative for dizziness, tremors, weakness and light-headedness.  Hematological: Negative for adenopathy. Does not bruise/bleed easily.  Psychiatric/Behavioral: Negative for behavioral problems, confusion, sleep disturbance, dysphoric mood, decreased concentration and agitation.    Physical Exam   BP 129/79 mmHg  Pulse 70  Temp(Src) 97.9 F (36.6 C) (Oral)  Ht  (1.854 m)  Wt 162 lb (73.483 kg)  BMI 21.38 kg/m2  Constitutional: He is oriented to person, place, and time.  He appears well-developed and well-nourished. No distress.  HENT:  Mouth/Throat: Oropharynx is clear and moist. No oropharyngeal exudate.  Cardiovascular: Normal rate, regular rhythm and normal heart sounds. Exam reveals no gallop and no friction rub.  No  murmur heard.  Pulmonary/Chest: Effort normal and breath sounds normal. No respiratory distress. He has no wheezes.  Abdominal: Soft. Bowel sounds are normal. He exhibits no distension. There is no tenderness.  Lymphadenopathy:  He has no cervical adenopathy.  Ext: right ankle is well healed, remains in removable brace Skin: Skin is warm and dry. No rash noted. No erythema.  Psychiatric: He has a normal mood and affect. His behavior is normal.    Lab Results  Component Value Date   CD4TCELL 6* 03/22/2014   Lab Results  Component Value Date   CD4TABS 60* 03/22/2014   CD4TABS 68* 10/26/2013   Lab Results  Component Value Date   HIV1RNAQUANT 4634* 03/22/2014   No results found for: HEPBSAB No results found for: RPR  CBC Lab Results  Component Value Date   WBC 4.4 03/21/2014   RBC 4.09* 03/21/2014   HGB 13.9 03/21/2014   HCT 41.5 03/21/2014   PLT 227 03/21/2014   MCV 101.5* 03/21/2014   MCH 34.0 03/21/2014   MCHC 33.5 03/21/2014   RDW 11.7 03/21/2014   LYMPHSABS 1.4 12/11/2013   MONOABS 0.5 12/11/2013   EOSABS 0.2 12/11/2013   BASOSABS 0.0 12/11/2013   BMET Lab Results  Component Value Date   NA 128* 03/24/2014   K 3.7 03/24/2014   CL 95* 03/24/2014   CO2 27 03/24/2014   GLUCOSE 109* 03/24/2014   BUN 13 03/24/2014   CREATININE 1.34 03/24/2014   CALCIUM 8.6 03/24/2014   GFRNONAA 64* 03/24/2014   GFRAA 74* 03/24/2014     Assessment and Plan  hiv disease= spent 25 min face to face time on adherence counseling. Has high level of resistance. We will do viral load with genotype today to determine what to change to. We will change him to DLG BID, DRV 600mg  BID, ritonavir 100mg  BID. Zidovudine 300mg  BID. I think this will help with adherence knowning all meds are BID. He will also have counseling with [facility] pharmacy today

## 2014-04-19 NOTE — Progress Notes (Signed)
Patient ID: [name], male   DOB: 1971-02-09, 44 y.o.   MRN: [id number] HPI: [name] is a 44 y.o. male who is here for his HIV f/o up.   Allergies: No Known Allergies  Vitals: Temp: 97.9 F (36.6 C) (02/04 0913) Temp Source: Oral (02/04 0913) BP: 129/79 mmHg (02/04 0913) Pulse Rate: 70 (02/04 0913)  Past Medical History: Past Medical History  Diagnosis Date  . HIV disease   . TB (pulmonary tuberculosis)     previously treated according to refugee documentation  . Hepatitis     "I don't know what hepatitis I have"  . Depression     "stress and depression for any man is common" (03/21/2014)    Social History: History   Social History  . Marital Status: Married    Spouse Name: N/A    Number of Children: N/A  . Years of Education: N/A   Social History Main Topics  . Smoking status: Former Smoker -- 1.00 packs/day for 22 years    Types: Cigarettes  . Smokeless tobacco: Never Used     Comment: "quit smoking ~ 2003"  . Alcohol Use: No  . Drug Use: No  . Sexual Activity:    Partners: Female   Other Topics Concern  . None   Social History Narrative   As of 09/18/2013:   -Arrived in [place]: September 05, 2013    -Refugee from [facility], spent 4 years in refugee camp in [place].    -Language: [name], requires Architect (speaks essentially no [place])   -Education: no formal education, previously worked as a Product/process development scientist: family phone number [phone], caseworker is Lannette Donath Dar Bi [phone] (with [employer])   -lives with daughter and three sons   -denies alcohol, drug, tobacco abuse    Previous Regimen:   Current Regimen: DTG 1 PO qday AZT  PO BID Prezcobix 1 PO qday  Labs: HIV 1 RNA QUANT (copies/mL)  Date Value  03/22/2014 4634*  12/11/2013 <20  10/26/2013 1267*   CD4 T CELL ABS (/uL)  Date Value  03/22/2014 60*  10/26/2013 68*   HEPATITIS B SURFACE AG (no units)  Date Value  09/26/2013 NEGATIVE   HCV AB (no  units)  Date Value  03/22/2014 NEGATIVE    CrCl: CrCl cannot be calculated (Patient has no serum creatinine result on file.).  Lipids:    Component Value Date/Time   CHOL 121 03/23/2014 0603   TRIG 27 03/23/2014 0603   HDL 37* 03/23/2014 0603   CHOLHDL 3.3 03/23/2014 0603   VLDL 5 03/23/2014 0603   LDLCALC 79 03/23/2014 0603   HIV Genotype Composite Data Genotype Dates:   Mutations in Bold impact drug susceptibility RT Mutations K65R, D67G, M184V, T215I, K219E, A98G, L100I, K103NS, V108I, P225H  PI Mutations T74S  Integrase Mutations None   Interpretation of Genotype Data per [employer] HIV Database Nucleoside RTIs  lamivudine (3TC) High-level resistance abacavir (ABC) High-level resistance zidovudine (AZT) Low-level resistance stavudine (D4T) High-level resistance didanosine (DDI) High-level resistance emtricitabine (FTC) High-level resistance tenofovir (TDF) High-level resistance   Non-Nucleoside RTIs  efavirenz (EFV) High-level resistance etravirine (ETR) Intermediate resistance nevirapine (NVP) High-level resistance rilpivirine (RPV) High-level resistance   Protease Inhibitors  atazanavir/r (ATV/r) Susceptible darunavir/r (DRV/r) Susceptible fosamprenavir/r (FPV/r) Susceptible indinavir/r (IDV/r) Susceptible lopinavir/r (LPV/r) Susceptible nelfinavir (NFV) Low-level resistance saquinavir/r (SQV/r) Susceptible tipranavir/r (TPV/r) Susceptible   Integrase Inhibitors  Results for GAETAN, SPIEKER (MRN [id number]) as of 04/19/2014 09:37  Ref. Range  03/22/2014 06:53  Raltegravir Resistance No range found NOT PREDICTED  Elvitegravir Resistance No range found NOT PREDICTED  Dolutegravir Resistance No range found NOT PREDICTED     Assessment: [name] who is here for his HIV f/u. Despite his regimen he still have an elevated VL. After reviewing his profile, he has significant resistance. See table above. After talking with Dr Drue SecondSnider, we are going to change his  regimen to BID to that it will stay consistent. The translator explain this to him and he agreed to it. We made him a calendar with the new pictures of the pills on it.  Recommendations: Change Prescobix to DRV/r 600/100mg  PO BID Change DTG to 50mg  PO BID Cont AZT 300mg  PO BID F/u with labs  Clide CliffPham, Ticia Virgo Quang, PharmD Clinical Infectious Disease Pharmacist [facility] 04/19/2014, 10:02 AM

## 2014-04-20 LAB — HIV-1 RNA ULTRAQUANT REFLEX TO GENTYP+
HIV 1 RNA Quant: 69 copies/mL — ABNORMAL HIGH (ref <20)
HIV-1 RNA Quant, Log: 1.84 {Log} — ABNORMAL HIGH (ref <1.30)

## 2014-04-20 MED ORDER — DOLUTEGRAVIR SODIUM 50 MG PO TABS: 50.0000 mg | ORAL_TABLET | Freq: Two times a day (BID) | ORAL | Status: DC

## 2014-04-23 LAB — HIV-1 GENOTYPR PLUS

## 2014-04-26 NOTE — Addendum Note (Signed)
Addended by: Lurlean LeydenPOOLE, Wayburn Shaler F on: 04/26/2014 03:31 PM   Modules accepted: Orders

## 2014-05-10 ENCOUNTER — Ambulatory Visit: Payer: Medicaid Other | Admitting: Internal Medicine

## 2014-05-31 ENCOUNTER — Ambulatory Visit: Payer: Medicaid Other | Admitting: Internal Medicine

## 2015-03-29 ENCOUNTER — Telehealth: Payer: Self-pay | Admitting: Pharmacy Technician

## 2015-05-03 NOTE — Telephone Encounter (Signed)
[name] will call outpatient pharmacy to get his HIV meds.  Broken english so not sure understood.  I will follow up.

## 2016-02-24 ENCOUNTER — Inpatient Hospital Stay (HOSPITAL_COMMUNITY)
Admission: EM | Admit: 2016-02-24 | Discharge: 2016-03-04 | DRG: 166 | Disposition: A | Discharge status: Home / Self Care | Payer: Commercial Managed Care - PPO | Attending: Family Medicine | Admitting: Family Medicine

## 2016-02-24 ENCOUNTER — Encounter (HOSPITAL_COMMUNITY): Payer: Self-pay

## 2016-02-24 ENCOUNTER — Emergency Department (HOSPITAL_COMMUNITY): Payer: Commercial Managed Care - PPO

## 2016-02-24 DIAGNOSIS — D72818 Other decreased white blood cell count: Secondary | ICD-10-CM | POA: Diagnosis present

## 2016-02-24 DIAGNOSIS — E871 Hypo-osmolality and hyponatremia: Secondary | ICD-10-CM | POA: Diagnosis not present

## 2016-02-24 DIAGNOSIS — B59 Pneumocystosis: Secondary | ICD-10-CM

## 2016-02-24 DIAGNOSIS — E43 Unspecified severe protein-calorie malnutrition: Secondary | ICD-10-CM | POA: Insufficient documentation

## 2016-02-24 DIAGNOSIS — Z1624 Resistance to multiple antibiotics: Secondary | ICD-10-CM | POA: Diagnosis present

## 2016-02-24 DIAGNOSIS — Z6825 Body mass index (BMI) 25.0-25.9, adult: Secondary | ICD-10-CM | POA: Diagnosis not present

## 2016-02-24 DIAGNOSIS — F329 Major depressive disorder, single episode, unspecified: Secondary | ICD-10-CM | POA: Diagnosis present

## 2016-02-24 DIAGNOSIS — E875 Hyperkalemia: Secondary | ICD-10-CM | POA: Diagnosis not present

## 2016-02-24 DIAGNOSIS — J449 Chronic obstructive pulmonary disease, unspecified: Secondary | ICD-10-CM | POA: Diagnosis present

## 2016-02-24 DIAGNOSIS — J189 Pneumonia, unspecified organism: Secondary | ICD-10-CM | POA: Diagnosis not present

## 2016-02-24 DIAGNOSIS — B20 Human immunodeficiency virus [HIV] disease: Secondary | ICD-10-CM | POA: Diagnosis not present

## 2016-02-24 DIAGNOSIS — I2699 Other pulmonary embolism without acute cor pulmonale: Secondary | ICD-10-CM | POA: Diagnosis not present

## 2016-02-24 DIAGNOSIS — Z8611 Personal history of tuberculosis: Secondary | ICD-10-CM

## 2016-02-24 DIAGNOSIS — D649 Anemia, unspecified: Secondary | ICD-10-CM | POA: Diagnosis present

## 2016-02-24 DIAGNOSIS — J9601 Acute respiratory failure with hypoxia: Secondary | ICD-10-CM | POA: Diagnosis not present

## 2016-02-24 DIAGNOSIS — Z8249 Family history of ischemic heart disease and other diseases of the circulatory system: Secondary | ICD-10-CM

## 2016-02-24 DIAGNOSIS — R0902 Hypoxemia: Secondary | ICD-10-CM

## 2016-02-24 DIAGNOSIS — K5900 Constipation, unspecified: Secondary | ICD-10-CM | POA: Diagnosis not present

## 2016-02-24 DIAGNOSIS — R590 Localized enlarged lymph nodes: Secondary | ICD-10-CM | POA: Diagnosis not present

## 2016-02-24 DIAGNOSIS — R0602 Shortness of breath: Secondary | ICD-10-CM | POA: Diagnosis not present

## 2016-02-24 DIAGNOSIS — Z87891 Personal history of nicotine dependence: Secondary | ICD-10-CM | POA: Diagnosis not present

## 2016-02-24 HISTORY — DX: Other pulmonary embolism without acute cor pulmonale: I26.99

## 2016-02-24 LAB — COMPREHENSIVE METABOLIC PANEL
ALT: 10 U/L — ABNORMAL LOW (ref 17–63)
AST: 27 U/L (ref 15–41)
Albumin: 2.3 g/dL — ABNORMAL LOW (ref 3.5–5.0)
Alkaline Phosphatase: 597 U/L — ABNORMAL HIGH (ref 38–126)
Anion gap: 7 (ref 5–15)
BILIRUBIN TOTAL: 0.5 mg/dL (ref 0.3–1.2)
BUN: 11 mg/dL (ref 6–20)
CO2: 28 mmol/L (ref 22–32)
Calcium: 9 mg/dL (ref 8.9–10.3)
Chloride: 100 mmol/L — ABNORMAL LOW (ref 101–111)
Creatinine, Ser: 1.01 mg/dL (ref 0.61–1.24)
GFR calc Af Amer: >60 mL/min (ref >60)
GFR calc non Af Amer: >60 mL/min (ref >60)
GLUCOSE: 138 mg/dL — AB (ref 65–99)
POTASSIUM: 3.7 mmol/L (ref 3.5–5.1)
SODIUM: 135 mmol/L (ref 135–145)
Total Protein: 8.8 g/dL — ABNORMAL HIGH (ref 6.5–8.1)

## 2016-02-24 LAB — I-STAT CG4 LACTIC ACID, ED: Lactic Acid, Venous: 1.47 mmol/L (ref 0.5–1.9)

## 2016-02-24 LAB — CBC WITH DIFFERENTIAL/PLATELET
BASOS ABS: 0 10*3/uL (ref 0.0–0.1)
Basophils Relative: 1 %
EOS PCT: 1 %
Eosinophils Absolute: 0.1 10*3/uL (ref 0.0–0.7)
HCT: 35.5 % — ABNORMAL LOW (ref 39.0–52.0)
HEMOGLOBIN: 12.1 g/dL — AB (ref 13.0–17.0)
LYMPHS PCT: 20 %
Lymphs Abs: 0.8 10*3/uL (ref 0.7–4.0)
MCH: 33.2 pg (ref 26.0–34.0)
MCHC: 34.1 g/dL (ref 30.0–36.0)
MCV: 97.5 fL (ref 78.0–100.0)
Monocytes Absolute: 0.7 10*3/uL (ref 0.1–1.0)
Monocytes Relative: 19 %
NEUTROS PCT: 59 %
Neutro Abs: 2.3 10*3/uL (ref 1.7–7.7)
PLATELETS: 204 10*3/uL (ref 150–400)
RBC: 3.64 MIL/uL — AB (ref 4.22–5.81)
RDW: 11.6 % (ref 11.5–15.5)
WBC: 3.9 10*3/uL — AB (ref 4.0–10.5)

## 2016-02-24 LAB — D-DIMER, QUANTITATIVE: D-Dimer, Quant: 1.17 ug/mL-FEU — ABNORMAL HIGH (ref 0.00–0.50)

## 2016-02-24 LAB — I-STAT BETA HCG BLOOD, ED (MC, WL, AP ONLY): I-stat hCG, quantitative: <5 m[IU]/mL (ref <5)

## 2016-02-24 MED ORDER — DEXTROSE 5 % IV SOLN
500.0000 mg | Freq: Once | INTRAVENOUS | Status: AC
  Administered 2016-02-24: 500 mg via INTRAVENOUS
  Filled 2016-02-24: qty 500

## 2016-02-24 MED ORDER — ENOXAPARIN SODIUM 80 MG/0.8ML ~~LOC~~ SOLN
80.0000 mg | Freq: Two times a day (BID) | SUBCUTANEOUS | Status: DC
  Filled 2016-02-24: qty 0.8

## 2016-02-24 MED ORDER — SODIUM CHLORIDE 0.9 % IV BOLUS (SEPSIS)
1000.0000 mL | Freq: Once | INTRAVENOUS | Status: AC
  Administered 2016-02-24: 1000 mL via INTRAVENOUS

## 2016-02-24 MED ORDER — DEXTROSE 5 % IV SOLN
1.0000 g | Freq: Once | INTRAVENOUS | Status: AC
  Administered 2016-02-24: 1 g via INTRAVENOUS
  Filled 2016-02-24: qty 10

## 2016-02-24 MED ORDER — IOPAMIDOL (ISOVUE-370) INJECTION 76%
INTRAVENOUS | Status: AC
  Administered 2016-02-24: 80 mL
  Filled 2016-02-24: qty 100

## 2016-02-24 MED ORDER — MORPHINE SULFATE (PF) 4 MG/ML IV SOLN
4.0000 mg | Freq: Once | INTRAVENOUS | Status: AC
  Administered 2016-02-24: 4 mg via INTRAVENOUS
  Filled 2016-02-24: qty 1

## 2016-02-24 NOTE — ED Triage Notes (Signed)
Per Pt with translator, Pt has had cough x 1 month with white sputum. Pt reports going to work yesterday and having sharp left rib pain that has worsened today. When patient was placed on the monitor, pt had oxygen of 86% on RA. 98% on 4L.

## 2016-02-24 NOTE — Progress Notes (Signed)
ANTICOAGULATION CONSULT NOTE - Initial Consult  Pharmacy Consult for Heparin Indication: pulmonary embolus  No Known Allergies  Patient Measurements: Height: 5' 8.9" (175 cm) Weight: 171 lb 15.3 oz (78 kg) IBW/kg (Calculated) : 70.47  Vital Signs: Temp: 98.5 F (36.9 C) (12/11 1905) Temp Source: Oral (12/11 1905) BP: 96/63 (12/11 2000) Pulse Rate: 104 (12/11 2000)  Labs:  Recent Labs  02/24/16 1858  HGB 12.1*  HCT 35.5*  PLT 204  CREATININE 1.01    Estimated Creatinine Clearance: 93.1 mL/min (by C-G formula based on SCr of 1.01 mg/dL).   Medical History: Past Medical History:  Diagnosis Date  . Depression    "stress and depression for any man is common" (03/21/2014)  . Hepatitis    "I don't know what hepatitis I have"  . HIV disease ([name])   . TB (pulmonary tuberculosis)    previously treated according to refugee documentation    Assessment: 45 y/o AAM presents with cough now with sharp L rib pain that has worsened. O2 sats 86% on RA. Non-English speaking  PMH: depression, hepatitis, HIV/AIDS, TB  Labs: Scr 1.01, Hgb 12.1, Plts 204  Anticoag: RUL PE. Baseline labs ok. [facility]  Goal of Therapy:  Anti-Xa level 0.6-1 units/ml 4hrs after LMWH dose given Monitor platelets by anticoagulation protocol: Yes   Plan:  Lovenox 80mg  SQ BID CBC q72h while on LMWh  Filimon Miranda S. Merilynn Finlandobertson, PharmD, BCPS Clinical Staff Pharmacist Pager [phone]  Misty Stanleyobertson, Kani Chauvin Stillinger 02/24/2016,10:55 PM

## 2016-02-24 NOTE — ED Provider Notes (Signed)
[facility]-EMERGENCY DEPT Provider Note   CSN: [id number] Arrival date & time: 02/24/16  1819     History   Chief Complaint Chief Complaint  Patient presents with  . Abdominal Pain    HPI [name] is a 45 y.o. male.  45 year old African-American male with a past medical history significant for HIV currently on medication followed by infectious disease, depression, hepatitis, history of tuberculosis that is a refugee from [place] presents to the ED today with left-sided rib pain, cough, shortness of breath. Patient states that his cough started approximately one month ago. He is coughing up white sputum. He reports going to work yesterday and developing sharp left-sided rib pain that has gradually worsened. He is tried on for the pain. Moving and breathing makes the pain worse. Patient endorses SOB at rest. Also endorses mild weight loss over the past 1 month. Denies any fevers, chill, ha, vision changes, lightheadedness, dizziness, cp, abd pain, nausea, emesis, urinary symptoms, change in bowel habits.        Past Medical History:  Diagnosis Date  . Depression    "stress and depression for any man is common" (03/21/2014)  . Hepatitis    "I don't know what hepatitis I have"  . HIV disease ([name])   . TB (pulmonary tuberculosis)    previously treated according to refugee documentation    Patient Active Problem List   Diagnosis Date Noted  . Renal insufficiency   . Surgery, elective   . AIDS ([name])   . S/P ORIF (open reduction internal fixation) fracture 03/21/2014  . Low back pain 10/10/2013  . CAP (community acquired pneumonia) 09/27/2013  . HIV disease ([name]) 09/18/2013  . Refugee health examination 09/18/2013  . Cough 09/18/2013    Past Surgical History:  Procedure Laterality Date  . FRACTURE SURGERY    . IM NAILING TIBIA Right 03/21/2014  . ORIF ANKLE FRACTURE Right 03/21/2014   lateral malleolus/notes 03/21/2014  . ORIF ANKLE FRACTURE Right 03/21/2014   Procedure:  OPEN REDUCTION INTERNAL FIXATION (ORIF) pilon ;  Surgeon: Eldred Manges, MD;  Location: [facility] OR;  Service: Orthopedics;  Laterality: Right;  . TIBIA IM NAIL INSERTION Right 03/21/2014   Procedure: INTRAMEDULLARY (IM) NAIL TIBIAL;  Surgeon: Eldred Manges, MD;  Location: [facility] OR;  Service: Orthopedics;  Laterality: Right;       Home Medications    Prior to Admission medications   Medication Sig Start Date End Date Taking? Authorizing Provider  azithromycin (ZITHROMAX) 600 MG tablet Take 2 tablets (1,200 mg total) by mouth once a week. 11/13/13   Judyann Munson, MD  darunavir (PREZISTA) 600 MG tablet Take 1 tablet (600 mg total) by mouth 2 (two) times daily with a meal. 04/19/14   Judyann Munson, MD  dolutegravir (TIVICAY) 50 MG tablet Take 1 tablet (50 mg total) by mouth 2 (two) times daily. 04/20/14   Judyann Munson, MD  guaiFENesin-dextromethorphan [facility] DM) 100-10 MG/5ML syrup Take 5 mLs by mouth every 4 (four) hours as needed for cough. 12/11/13   Judyann Munson, MD  methocarbamol (ROBAXIN) 500 MG tablet Take 1 tablet (500 mg total) by mouth every 6 (six) hours as needed for muscle spasms. 03/23/14   Naida Sleight, PA-C  ondansetron (ZOFRAN ODT) 4 MG disintegrating tablet Take 1 tablet (4 mg total) by mouth every 8 (eight) hours as needed. 03/23/14   Naida Sleight, PA-C  oxyCODONE-acetaminophen (PERCOCET) 10-325 MG per tablet Take 1 tablet by mouth every 6 (six) hours as needed.  03/23/14   Naida Sleight, PA-C  ritonavir (NORVIR) 100 MG TABS tablet Take 1 tablet (100 mg total) by mouth 2 (two) times daily with a meal. 04/19/14   Judyann Munson, MD  sulfamethoxazole-trimethoprim (BACTRIM) 400-80 MG per tablet Take 1 tablet by mouth daily. 10/26/13   Judyann Munson, MD  zidovudine (RETROVIR) 300 MG tablet Take 1 tablet (300 mg total) by mouth 2 (two) times daily. 11/13/13   Judyann Munson, MD    Family History No family history on file.  Social History Social History  Substance Use Topics  . Smoking  status: Former Smoker    Packs/day: 1.00    Years: 22.00    Types: Cigarettes  . Smokeless tobacco: Never Used     Comment: "quit smoking ~ 2003"  . Alcohol use No     Allergies   Patient has no known allergies.   Review of Systems Review of Systems  Constitutional: Positive for unexpected weight change. Negative for chills and fever.  HENT: Negative for congestion, ear pain, rhinorrhea and sore throat.   Eyes: Negative for pain and discharge.  Respiratory: Positive for cough and shortness of breath.   Cardiovascular: Negative for chest pain and palpitations.  Gastrointestinal: Negative for abdominal pain, diarrhea, nausea and vomiting.  Genitourinary: Negative for flank pain, frequency, hematuria and urgency.  Musculoskeletal: Negative for myalgias and neck pain.  Neurological: Negative for dizziness, syncope, weakness, light-headedness, numbness and headaches.  All other systems reviewed and are negative.    Physical Exam Updated Vital Signs BP 97/68 (BP Location: Left Arm)   Pulse 120   Temp 98.5 F (36.9 C) (Oral)   Resp 20   Ht 5' 8.9" (1.75 m)   Wt 78 kg   SpO2 (!) 86%   BMI 25.47 kg/m   Physical Exam  Constitutional: He appears well-developed and well-nourished. No distress.  HENT:  Head: Normocephalic and atraumatic.  Mouth/Throat: Oropharynx is clear and moist.  Eyes: Conjunctivae are normal. Right eye exhibits no discharge. Left eye exhibits no discharge. No scleral icterus.  Neck: Normal range of motion. Neck supple. No thyromegaly present.  Cardiovascular: Regular rhythm, normal heart sounds and intact distal pulses.  Tachycardia present.  Exam reveals no gallop and no friction rub.   No murmur heard. Pulmonary/Chest: Effort normal. No accessory muscle usage. Tachypnea noted. No respiratory distress. He has decreased breath sounds. He has rhonchi (all lung fields).  Abdominal: Soft. Bowel sounds are normal. He exhibits no distension. There is no  tenderness. There is no rebound and no guarding.  Musculoskeletal: Normal range of motion.  Lymphadenopathy:    He has no cervical adenopathy.  Neurological: He is alert.  Skin: Skin is warm and dry. Capillary refill takes less than 2 seconds.  Nursing note and vitals reviewed.    ED Treatments / Results  Labs (all labs ordered are listed, but only abnormal results are displayed) Labs Reviewed  COMPREHENSIVE METABOLIC PANEL - Abnormal; Notable for the following:       Result Value   Chloride 100 (*)    Glucose, Bld 138 (*)    Total Protein 8.8 (*)    Albumin 2.3 (*)    ALT 10 (*)    Alkaline Phosphatase 597 (*)    All other components within normal limits  CBC WITH DIFFERENTIAL/PLATELET - Abnormal; Notable for the following:    WBC 3.9 (*)    RBC 3.64 (*)    Hemoglobin 12.1 (*)    HCT 35.5 (*)  All other components within normal limits  URINALYSIS, ROUTINE W REFLEX MICROSCOPIC - Abnormal; Notable for the following:    Specific Gravity, Urine <1.005 (*)    All other components within normal limits  D-DIMER, QUANTITATIVE (NOT AT [facility]) - Abnormal; Notable for the following:    D-Dimer, Quant 1.17 (*)    All other components within normal limits  LACTATE DEHYDROGENASE - Abnormal; Notable for the following:    LDH 419 (*)    All other components within normal limits  T-HELPER CELLS (CD4) COUNT (NOT AT [facility]) - Abnormal; Notable for the following:    CD4 T Cell Abs 20 (*)    CD4 % Helper T Cell 4 (*)    All other components within normal limits  COMPREHENSIVE METABOLIC PANEL - Abnormal; Notable for the following:    Calcium 8.5 (*)    Albumin 2.0 (*)    ALT 9 (*)    Alkaline Phosphatase 485 (*)    All other components within normal limits  CBC WITH DIFFERENTIAL/PLATELET - Abnormal; Notable for the following:    WBC 2.4 (*)    RBC 3.29 (*)    Hemoglobin 10.6 (*)    HCT 32.6 (*)    Neutro Abs 1.2 (*)    Lymphs Abs 0.6 (*)    All other components within normal limits    IRON AND TIBC - Abnormal; Notable for the following:    Iron 13 (*)    TIBC 181 (*)    Saturation Ratios 7 (*)    All other components within normal limits  FERRITIN - Abnormal; Notable for the following:    Ferritin 616 (*)    All other components within normal limits  RETICULOCYTES - Abnormal; Notable for the following:    RBC. 3.29 (*)    All other components within normal limits  CULTURE, BLOOD (ROUTINE X 2)  CULTURE, BLOOD (ROUTINE X 2)  URINE CULTURE  CULTURE, EXPECTORATED SPUTUM-ASSESSMENT  GRAM STAIN  PNEUMOCYSTIS JIROVECI SMEAR BY DFA  ACID FAST SMEAR (AFB)  ACID FAST CULTURE WITH REFLEXED SENSITIVITIES  HEPARIN LEVEL (UNFRACTIONATED)  STREP PNEUMONIAE URINARY ANTIGEN  VITAMIN B12  FOLATE  TROPONIN I  TROPONIN I  TROPONIN I  HEPARIN LEVEL (UNFRACTIONATED)  QUANTIFERON TB GOLD ASSAY (BLOOD)  HIV 1 RNA QUANT-NO REFLEX-BLD  HIV ANTIBODY (ROUTINE TESTING)  LEGIONELLA PNEUMOPHILA SEROGP 1 UR AG  INFLUENZA PANEL BY PCR (TYPE A & B, H1N1)  HEPARIN LEVEL (UNFRACTIONATED)  CBC  COMPREHENSIVE METABOLIC PANEL  CBC WITH DIFFERENTIAL/PLATELET  MAGNESIUM  I-STAT BETA HCG BLOOD, ED ([facility], [facility], [facility] ONLY)  I-STAT CG4 LACTIC ACID, ED    EKG  EKG Interpretation None       Radiology Dg Chest 2 View  Result Date: 02/24/2016 CLINICAL DATA:  Cough for 1 month EXAM: CHEST  2 VIEW COMPARISON:  10/03/2013 FINDINGS: Cardiac shadow is within normal limits. Lungs are well aerated bilaterally. Patchy infiltrate is noted in the right lung base and superimposed over chronic changes in the left mid lung. No sizable effusion is seen. No bony abnormality is noted. Diffuse chronic fibrotic changes are noted throughout both lungs. IMPRESSION: Acute on chronic infiltrate within the right base and left mid lung. Electronically Signed   By: Alcide CleverMark  Lukens M.D.   On: 02/24/2016 20:40   Ct Angio Chest Pe W/cm &/or Wo Cm  Result Date: 02/24/2016 CLINICAL DATA:  45 year old male with cough and  shortness of breath and chest pain x1 month. EXAM: CT ANGIOGRAPHY CHEST WITH  CONTRAST TECHNIQUE: Multidetector CT imaging of the chest was performed using the standard protocol during bolus administration of intravenous contrast. Multiplanar CT image reconstructions and MIPs were obtained to evaluate the vascular anatomy. CONTRAST:  80 cc Isovue 370 COMPARISON:  Chest radiograph dated 02/24/2016 FINDINGS: Cardiovascular: There is no cardiomegaly or pericardial effusion. The thoracic aorta appears unremarkable. The origins of the great vessels of the aortic arch appear patent. The evaluation for pulmonary artery is somewhat limited due to respiratory motion artifact. There is a small filling defect involving the right upper lobe pulmonary artery branch (series 5, image 120). The remainder of the visualized pulmonary arteries appear patent. Mediastinum/Nodes: There is right hilar adenopathy measuring up to 2 cm in short axis. Subcarinal adenopathy as well as aortopulmonic adenopathy noted. The esophagus is grossly unremarkable. No definite thyroid nodules identified. Lungs/Pleura: There is emphysematous changes of the lungs. There are bronchiectatic changes primarily involving the left lower lobe and left upper lobe. There is subpleural collapse with associated scarring in the apices bilaterally. There is a patchy and confluent ground-glass and nodular density primarily involving the right lower lobe most compatible with pneumonia. Left upper lobe interstitial thickening and ground-glass densities likely represent acute infection superimposed on a background of chronic changes. An area of nodular and ground-glass density also noted in the right middle lobe. Scattered nodularity in the right upper lobe most likely inflammatory/ infectious in etiology. There is no pleural effusion or pneumothorax. The central airways are patent. Upper Abdomen: No acute abnormality. Musculoskeletal: There is loss of subcutaneous fat and  cachexia. The osseous structures are intact. Review of the MIP images confirms the above findings. IMPRESSION: Right upper lobe small segmental pulmonary artery embolus. Multifocal pneumonia on a background of emphysema, chronic lung disease, and bronchiectatic changes. Clinical correlation and follow-up resolution recommended. Right hilar and mediastinal adenopathy, likely reactive. Clinical correlation follow-up recommended. Cachexia. These results were called by telephone at the time of interpretation on 02/24/2016 at 10:37 pm to physician assistant Samael Blades , who verbally acknowledged these results. Electronically Signed   By: Elgie CollardArash  Radparvar M.D.   On: 02/24/2016 22:40    Procedures Procedures (including critical care time)  Medications Ordered in ED Medications  heparin ADULT infusion 100 units/mL (25000 units/2750mL sodium chloride 0.45%) (1,500 Units/hr Intravenous New Bag/Given 02/25/16 1525)  acetaminophen (TYLENOL) tablet 650 mg (not administered)    Or  acetaminophen (TYLENOL) suppository 650 mg (not administered)  ondansetron (ZOFRAN) tablet 4 mg (not administered)    Or  ondansetron (ZOFRAN) injection 4 mg (not administered)  sulfamethoxazole-trimethoprim (BACTRIM) 320 mg in dextrose 5 % 500 mL IVPB (320 mg Intravenous Given 02/25/16 1525)  Influenza vac split quadrivalent PF (FLUARIX) injection 0.5 mL (not administered)  pneumococcal 23 valent vaccine (PNU-IMMUNE) injection 0.5 mL (not administered)  predniSONE (DELTASONE) tablet 60 mg (60 mg Oral Given 02/25/16 0654)  dolutegravir (TIVICAY) tablet 50 mg (50 mg Oral Given 02/25/16 1524)  darunavir-cobicistat (PREZCOBIX) 800-150 MG per tablet 1 tablet (not administered)  zidovudine (RETROVIR) capsule 100 mg (100 mg Oral Given 02/25/16 1820)  feeding supplement (ENSURE ENLIVE) (ENSURE ENLIVE) liquid 237 mL (not administered)  multivitamin with minerals tablet 1 tablet (1 tablet Oral Given 02/25/16 1820)  sodium chloride 0.9 %  bolus 1,000 mL (0 mLs Intravenous Stopped 02/24/16 2240)  iopamidol (ISOVUE-370) 76 % injection (80 mLs  Contrast Given 02/24/16 2154)  cefTRIAXone (ROCEPHIN) 1 g in dextrose 5 % 50 mL IVPB (0 g Intravenous Stopped 02/25/16 0035)  azithromycin (ZITHROMAX) 500 mg  in dextrose 5 % 250 mL IVPB (0 mg Intravenous Stopped 02/25/16 2049)  morphine 4 MG/ML injection 4 mg (4 mg Intravenous Given 02/24/16 2307)  heparin bolus via infusion 5,000 Units (5,000 Units Intravenous Bolus from Bag 02/25/16 0124)  sodium chloride 0.9 % bolus 1,000 mL (1,000 mLs Intravenous Given 02/25/16 1811)     Initial Impression / Assessment and Plan / ED Course  I have reviewed the triage vital signs and the nursing notes.  Pertinent labs & imaging results that were available during my care of the patient were reviewed by me and considered in my medical decision making (see chart for details).  Clinical Course   Patient presented to the ED with cough and sob x 1 month with a history of HIV and TB. Patient was tachycardic in triage and hypoxic on RA at 86%. Course lungs sound noted on exam. No leukocytosis noted. Lactic was normal. Patient was afebrile. CXR concerning for multifocal pna. D dimer was elevated. CTA of chest was performed and showed PE with multifocal pna, COPD and bronchiectasis. HE continued to be hypoxic on RA. Bld cx were obtained prior to abx. He was initiated on ctrx and azithromycin given he has not been hospitalized in the past 3 months. He was also initiated on heparin and placed on airborne precautions. All other labs were unremarkable. He ws intially hypotensive and given 1 L of fluid. Consulted with Dr. Jacalyn Lefevre with triad hospitalist who agrees to admit patient. He asked to obtain LDH and quantiferon gold before admission. Patient is hemodynamically able and in nad. Dicussed patient with Dr. Fredderick Phenix who is agreeable to the above plan.  Final Clinical Impressions(s) / ED Diagnoses   Final diagnoses:    PE (pulmonary thromboembolism) ([name])  Multifocal pneumonia    New Prescriptions New Prescriptions   No medications on file     Rise Mu, PA-C 02/25/16 2150    Rise Mu, PA-C 02/25/16 [id number]    Rolan Bucco, MD 02/26/16 0003

## 2016-02-25 ENCOUNTER — Inpatient Hospital Stay (HOSPITAL_COMMUNITY): Payer: Commercial Managed Care - PPO

## 2016-02-25 ENCOUNTER — Encounter (HOSPITAL_COMMUNITY): Payer: Self-pay

## 2016-02-25 DIAGNOSIS — J189 Pneumonia, unspecified organism: Secondary | ICD-10-CM

## 2016-02-25 DIAGNOSIS — D649 Anemia, unspecified: Secondary | ICD-10-CM | POA: Diagnosis present

## 2016-02-25 DIAGNOSIS — J9601 Acute respiratory failure with hypoxia: Secondary | ICD-10-CM

## 2016-02-25 DIAGNOSIS — B20 Human immunodeficiency virus [HIV] disease: Secondary | ICD-10-CM

## 2016-02-25 DIAGNOSIS — I2699 Other pulmonary embolism without acute cor pulmonale: Secondary | ICD-10-CM

## 2016-02-25 DIAGNOSIS — E43 Unspecified severe protein-calorie malnutrition: Secondary | ICD-10-CM | POA: Insufficient documentation

## 2016-02-25 LAB — CBC WITH DIFFERENTIAL/PLATELET
BASOS ABS: 0 10*3/uL (ref 0.0–0.1)
BASOS PCT: 0 %
Eosinophils Absolute: 0.1 10*3/uL (ref 0.0–0.7)
Eosinophils Relative: 3 %
HEMATOCRIT: 32.6 % — AB (ref 39.0–52.0)
HEMOGLOBIN: 10.6 g/dL — AB (ref 13.0–17.0)
LYMPHS PCT: 27 %
Lymphs Abs: 0.6 10*3/uL — ABNORMAL LOW (ref 0.7–4.0)
MCH: 32.2 pg (ref 26.0–34.0)
MCHC: 32.5 g/dL (ref 30.0–36.0)
MCV: 99.1 fL (ref 78.0–100.0)
MONO ABS: 0.5 10*3/uL (ref 0.1–1.0)
Monocytes Relative: 21 %
NEUTROS ABS: 1.2 10*3/uL — AB (ref 1.7–7.7)
NEUTROS PCT: 49 %
Platelets: 182 10*3/uL (ref 150–400)
RBC: 3.29 MIL/uL — ABNORMAL LOW (ref 4.22–5.81)
RDW: 11.9 % (ref 11.5–15.5)
WBC: 2.4 10*3/uL — ABNORMAL LOW (ref 4.0–10.5)

## 2016-02-25 LAB — URINALYSIS, ROUTINE W REFLEX MICROSCOPIC
BILIRUBIN URINE: NEGATIVE
GLUCOSE, UA: NEGATIVE mg/dL
Hgb urine dipstick: NEGATIVE
KETONES UR: NEGATIVE mg/dL
Leukocytes, UA: NEGATIVE
Nitrite: NEGATIVE
Protein, ur: NEGATIVE mg/dL
Specific Gravity, Urine: <1.005 — ABNORMAL LOW (ref 1.005–1.030)
pH: 7 (ref 5.0–8.0)

## 2016-02-25 LAB — IRON AND TIBC
IRON: 13 ug/dL — AB (ref 45–182)
SATURATION RATIOS: 7 % — AB (ref 17.9–39.5)
TIBC: 181 ug/dL — AB (ref 250–450)
UIBC: 168 ug/dL

## 2016-02-25 LAB — COMPREHENSIVE METABOLIC PANEL
ALBUMIN: 2 g/dL — AB (ref 3.5–5.0)
ALT: 9 U/L — ABNORMAL LOW (ref 17–63)
ANION GAP: 6 (ref 5–15)
AST: 22 U/L (ref 15–41)
Alkaline Phosphatase: 485 U/L — ABNORMAL HIGH (ref 38–126)
BILIRUBIN TOTAL: 0.4 mg/dL (ref 0.3–1.2)
BUN: 9 mg/dL (ref 6–20)
CALCIUM: 8.5 mg/dL — AB (ref 8.9–10.3)
CO2: 27 mmol/L (ref 22–32)
Chloride: 103 mmol/L (ref 101–111)
Creatinine, Ser: 0.86 mg/dL (ref 0.61–1.24)
GFR calc non Af Amer: >60 mL/min (ref >60)
GLUCOSE: 95 mg/dL (ref 65–99)
POTASSIUM: 3.6 mmol/L (ref 3.5–5.1)
SODIUM: 136 mmol/L (ref 135–145)
TOTAL PROTEIN: 7.5 g/dL (ref 6.5–8.1)

## 2016-02-25 LAB — TROPONIN I

## 2016-02-25 LAB — FERRITIN: FERRITIN: 616 ng/mL — AB (ref 24–336)

## 2016-02-25 LAB — STREP PNEUMONIAE URINARY ANTIGEN: STREP PNEUMO URINARY ANTIGEN: NEGATIVE

## 2016-02-25 LAB — RETICULOCYTES
RBC.: 3.29 MIL/uL — ABNORMAL LOW (ref 4.22–5.81)
Retic Count, Absolute: 23 10*3/uL (ref 19.0–186.0)
Retic Ct Pct: 0.7 % (ref 0.4–3.1)

## 2016-02-25 LAB — ECHOCARDIOGRAM COMPLETE
Height: 68 in
Weight: 1923.2 oz

## 2016-02-25 LAB — T-HELPER CELLS (CD4) COUNT (NOT AT ARMC)
CD4 T CELL HELPER: 4 % — AB (ref 33–55)
CD4 T Cell Abs: 20 /uL — ABNORMAL LOW (ref 400–2700)

## 2016-02-25 LAB — INFLUENZA PANEL BY PCR (TYPE A & B)
INFLAPCR: NEGATIVE
Influenza B By PCR: NEGATIVE

## 2016-02-25 LAB — FOLATE: FOLATE: 11.3 ng/mL (ref >5.9)

## 2016-02-25 LAB — HEPARIN LEVEL (UNFRACTIONATED)
HEPARIN UNFRACTIONATED: 0.58 [IU]/mL (ref 0.30–0.70)
HEPARIN UNFRACTIONATED: 0.62 [IU]/mL (ref 0.30–0.70)

## 2016-02-25 LAB — VITAMIN B12: Vitamin B-12: 291 pg/mL (ref 180–914)

## 2016-02-25 LAB — LACTATE DEHYDROGENASE: LDH: 419 U/L — ABNORMAL HIGH (ref 98–192)

## 2016-02-25 MED ORDER — ACETAMINOPHEN 650 MG RE SUPP: 650.0000 mg | Freq: Four times a day (QID) | RECTAL | Status: DC | PRN

## 2016-02-25 MED ORDER — ONDANSETRON HCL 4 MG PO TABS: 4.0000 mg | ORAL_TABLET | Freq: Four times a day (QID) | ORAL | Status: DC | PRN

## 2016-02-25 MED ORDER — ACETAMINOPHEN 325 MG PO TABS: 650.0000 mg | ORAL_TABLET | Freq: Four times a day (QID) | ORAL | Status: DC | PRN

## 2016-02-25 MED ORDER — ADULT MULTIVITAMIN W/MINERALS CH
1.0000 | ORAL_TABLET | Freq: Every day | ORAL | Status: DC
  Administered 2016-02-25 – 2016-03-04 (×9): 1 via ORAL
  Filled 2016-02-25 (×9): qty 1

## 2016-02-25 MED ORDER — ENSURE ENLIVE PO LIQD
237.0000 mL | Freq: Two times a day (BID) | ORAL | Status: DC
  Administered 2016-02-25 (×2): 237 mL via ORAL

## 2016-02-25 MED ORDER — DOLUTEGRAVIR SODIUM 50 MG PO TABS
50.0000 mg | ORAL_TABLET | Freq: Two times a day (BID) | ORAL | Status: DC
  Administered 2016-02-25 – 2016-03-03 (×15): 50 mg via ORAL
  Filled 2016-02-25 (×15): qty 1

## 2016-02-25 MED ORDER — DEXTROSE 5 % IV SOLN
500.0000 mg | INTRAVENOUS | Status: DC
  Filled 2016-02-25: qty 500

## 2016-02-25 MED ORDER — ENSURE ENLIVE PO LIQD
237.0000 mL | Freq: Three times a day (TID) | ORAL | Status: DC
  Administered 2016-02-25 – 2016-03-02 (×13): 237 mL via ORAL

## 2016-02-25 MED ORDER — DARUNAVIR-COBICISTAT 800-150 MG PO TABS
1.0000 | ORAL_TABLET | Freq: Every day | ORAL | Status: DC
  Administered 2016-02-26 – 2016-02-28 (×3): 1 via ORAL
  Filled 2016-02-25 (×4): qty 1

## 2016-02-25 MED ORDER — CEFTRIAXONE SODIUM 1 G IJ SOLR
1.0000 g | INTRAMUSCULAR | Status: DC
  Filled 2016-02-25: qty 10

## 2016-02-25 MED ORDER — ZIDOVUDINE 100 MG PO CAPS
100.0000 mg | ORAL_CAPSULE | Freq: Three times a day (TID) | ORAL | Status: DC
  Administered 2016-02-25 – 2016-03-03 (×21): 100 mg via ORAL
  Filled 2016-02-25 (×23): qty 1

## 2016-02-25 MED ORDER — ONDANSETRON HCL 4 MG/2ML IJ SOLN
4.0000 mg | Freq: Four times a day (QID) | INTRAMUSCULAR | Status: DC | PRN
  Administered 2016-02-29 – 2016-03-03 (×6): 4 mg via INTRAVENOUS
  Filled 2016-02-25 (×6): qty 2

## 2016-02-25 MED ORDER — SULFAMETHOXAZOLE-TRIMETHOPRIM 400-80 MG/5ML IV SOLN
320.0000 mg | Freq: Three times a day (TID) | INTRAVENOUS | Status: DC
  Administered 2016-02-25 – 2016-02-27 (×8): 320 mg via INTRAVENOUS
  Filled 2016-02-25 (×14): qty 20

## 2016-02-25 MED ORDER — PREDNISONE 50 MG PO TABS
60.0000 mg | ORAL_TABLET | Freq: Every day | ORAL | Status: DC
  Administered 2016-02-25 – 2016-02-28 (×4): 60 mg via ORAL
  Filled 2016-02-25 (×4): qty 1

## 2016-02-25 MED ORDER — HEPARIN (PORCINE) IN NACL 100-0.45 UNIT/ML-% IJ SOLN
1250.0000 [IU]/h | INTRAMUSCULAR | Status: DC
  Administered 2016-02-25 (×2): 1500 [IU]/h via INTRAVENOUS
  Administered 2016-02-26: 1400 [IU]/h via INTRAVENOUS
  Filled 2016-02-25 (×3): qty 250

## 2016-02-25 MED ORDER — SODIUM CHLORIDE 0.9 % IV BOLUS (SEPSIS)
1000.0000 mL | Freq: Once | INTRAVENOUS | Status: AC
  Administered 2016-02-25: 1000 mL via INTRAVENOUS

## 2016-02-25 MED ORDER — PNEUMOCOCCAL VAC POLYVALENT 25 MCG/0.5ML IJ INJ
0.5000 mL | INJECTION | INTRAMUSCULAR | Status: AC
  Administered 2016-02-29: 0.5 mL via INTRAMUSCULAR
  Filled 2016-02-25 (×2): qty 0.5

## 2016-02-25 MED ORDER — HEPARIN BOLUS VIA INFUSION
5000.0000 [IU] | Freq: Once | INTRAVENOUS | Status: AC
  Administered 2016-02-25: 5000 [IU] via INTRAVENOUS
  Filled 2016-02-25: qty 5000

## 2016-02-25 MED ORDER — INFLUENZA VAC SPLIT QUAD 0.5 ML IM SUSY
0.5000 mL | PREFILLED_SYRINGE | INTRAMUSCULAR | Status: AC
  Administered 2016-02-29: 0.5 mL via INTRAMUSCULAR
  Filled 2016-02-25: qty 0.5

## 2016-02-25 NOTE — Progress Notes (Signed)
  Echocardiogram 2D Echocardiogram has been performed.  [name] 02/25/2016, 1:25 PM

## 2016-02-25 NOTE — H&P (Signed)
History and Physical    [name] Mood UJW:[id number] DOB: 08/14/1970 DOA: 02/24/2016  PCP: Renold DonWALDEN,JEFF, MD  Patient coming from: Home.  Patient speaks [name].[name] translator was used.  Chief Complaint: Shortness of breath.  HPI: [name] Heese is a 45 y.o. male with history of AIDS last CD4 count in our system in January 2016 was 61 presents to the ER because of worsening shortness of breath. Patient states over the last 1 month, patient has been having increasing shortness of breath even at rest. Shortness of breath associated with pleuritic type of chest pain and productive cough. Denies any hemoptysis. Denies any fever or chills but has been losing weight. CT scan of the chest shows pulmonary embolism with multifocal pneumonia COPD and bronchiectasis. Patient is being admitted for acute respiratory failure secondary to pneumonia and PE. Patient has had previous history of TB as per the records.   ED Course: Blood cultures obtained started on empiric antibiotics for pneumonia patient is placed on airborne precautions. CT scan shows multifocal pneumonia and pulmonary embolism. Patient was started on heparin. LDH levels are elevated.  Review of Systems: As per HPI, rest all negative.   Past Medical History:  Diagnosis Date  . Depression    "stress and depression for any man is common" (03/21/2014)  . Hepatitis    "I don't know what hepatitis I have"  . HIV disease ([name])   . TB (pulmonary tuberculosis)    previously treated according to refugee documentation    Past Surgical History:  Procedure Laterality Date  . FRACTURE SURGERY    . IM NAILING TIBIA Right 03/21/2014  . ORIF ANKLE FRACTURE Right 03/21/2014   lateral malleolus/notes 03/21/2014  . ORIF ANKLE FRACTURE Right 03/21/2014   Procedure: OPEN REDUCTION INTERNAL FIXATION (ORIF) pilon ;  Surgeon: Eldred MangesMark C Yates, MD;  Location: [facility] OR;  Service: Orthopedics;  Laterality: Right;  . TIBIA IM NAIL INSERTION Right 03/21/2014   Procedure: INTRAMEDULLARY (IM) NAIL TIBIAL;  Surgeon: Eldred MangesMark C Yates, MD;  Location: [facility] OR;  Service: Orthopedics;  Laterality: Right;     reports that he has quit smoking. His smoking use included Cigarettes. He has a 22.00 pack-year smoking history. He has never used smokeless tobacco. He reports that he does not drink alcohol or use drugs.  No Known Allergies  Family History  Problem Relation Age of Onset  . Hypertension Other     Prior to Admission medications   Medication Sig Start Date End Date Taking? Authorizing Provider  azithromycin (ZITHROMAX) 600 MG tablet Take 2 tablets (1,200 mg total) by mouth once a week. 11/13/13   Judyann Munsonynthia Snider, MD  darunavir (PREZISTA) 600 MG tablet Take 1 tablet (600 mg total) by mouth 2 (two) times daily with a meal. 04/19/14   Judyann Munsonynthia Snider, MD  dolutegravir (TIVICAY) 50 MG tablet Take 1 tablet (50 mg total) by mouth 2 (two) times daily. 04/20/14   Judyann Munsonynthia Snider, MD  guaiFENesin-dextromethorphan [facility](ROBITUSSIN DM) 100-10 MG/5ML syrup Take 5 mLs by mouth every 4 (four) hours as needed for cough. 12/11/13   Judyann Munsonynthia Snider, MD  methocarbamol (ROBAXIN) 500 MG tablet Take 1 tablet (500 mg total) by mouth every 6 (six) hours as needed for muscle spasms. 03/23/14   Naida SleightJames M Owens, PA-C  ondansetron (ZOFRAN ODT) 4 MG disintegrating tablet Take 1 tablet (4 mg total) by mouth every 8 (eight) hours as needed. 03/23/14   Naida SleightJames M Owens, PA-C  oxyCODONE-acetaminophen (PERCOCET) 10-325 MG per tablet Take 1 tablet by  mouth every 6 (six) hours as needed. 03/23/14   Naida SleightJames M Owens, PA-C  ritonavir (NORVIR) 100 MG TABS tablet Take 1 tablet (100 mg total) by mouth 2 (two) times daily with a meal. 04/19/14   Judyann Munsonynthia Snider, MD  sulfamethoxazole-trimethoprim (BACTRIM) 400-80 MG per tablet Take 1 tablet by mouth daily. 10/26/13   Judyann Munsonynthia Snider, MD  zidovudine (RETROVIR) 300 MG tablet Take 1 tablet (300 mg total) by mouth 2 (two) times daily. 11/13/13   Judyann Munsonynthia Snider, MD    Physical  Exam: Vitals:   02/24/16 2330 02/25/16 0015 02/25/16 0030 02/25/16 0054  BP:  105/71 103/71 103/64  Pulse:  87 87 85  Resp:  19 20 20   Temp:    98.6 F (37 C)  TempSrc:    Oral  SpO2: 96% 96% 96% 99%  Weight:    54.4 kg (119 lb 14.9 oz)  Height:    5\' 8"  (1.727 m)      Constitutional: Poorly built and nourished. Vitals:   02/24/16 2330 02/25/16 0015 02/25/16 0030 02/25/16 0054  BP:  105/71 103/71 103/64  Pulse:  87 87 85  Resp:  19 20 20   Temp:    98.6 F (37 C)  TempSrc:    Oral  SpO2: 96% 96% 96% 99%  Weight:    54.4 kg (119 lb 14.9 oz)  Height:    5\' 8"  (1.727 m)   Eyes: Anicteric no pallor. ENMT: No discharge from the ears eyes nose or mouth. Neck: No mass felt. No neck rigidity. Respiratory: No rhonchi or crepitations. Cardiovascular: S1-S2 heard no murmurs appreciated. Abdomen: Soft nontender bowel sounds present. No guarding or rigidity. Musculoskeletal: No edema. No joint effusion. Skin: Appears warm. No rash. Neurologic: Alert awake oriented to time place and person. Moves all extremities. Psychiatric: Appears normal. Normal affect.   Labs on Admission: I have personally reviewed following labs and imaging studies  CBC:  Recent Labs Lab 02/24/16 1858  WBC 3.9*  NEUTROABS 2.3  HGB 12.1*  HCT 35.5*  MCV 97.5  PLT 204   Basic Metabolic Panel:  Recent Labs Lab 02/24/16 1858  NA 135  K 3.7  CL 100*  CO2 28  GLUCOSE 138*  BUN 11  CREATININE 1.01  CALCIUM 9.0   GFR: Estimated Creatinine Clearance: 71.8 mL/min (by C-G formula based on SCr of 1.01 mg/dL). Liver Function Tests:  Recent Labs Lab 02/24/16 1858  AST 27  ALT 10*  ALKPHOS 597*  BILITOT 0.5  PROT 8.8*  ALBUMIN 2.3*   No results for input(s): LIPASE, AMYLASE in the last 168 hours. No results for input(s): AMMONIA in the last 168 hours. Coagulation Profile: No results for input(s): INR, PROTIME in the last 168 hours. Cardiac Enzymes: No results for input(s): CKTOTAL,  CKMB, CKMBINDEX, TROPONINI in the last 168 hours. BNP (last 3 results) No results for input(s): PROBNP in the last 8760 hours. HbA1C: No results for input(s): HGBA1C in the last 72 hours. CBG: No results for input(s): GLUCAP in the last 168 hours. Lipid Profile: No results for input(s): CHOL, HDL, LDLCALC, TRIG, CHOLHDL, LDLDIRECT in the last 72 hours. Thyroid Function Tests: No results for input(s): TSH, T4TOTAL, FREET4, T3FREE, THYROIDAB in the last 72 hours. Anemia Panel: No results for input(s): VITAMINB12, FOLATE, FERRITIN, TIBC, IRON, RETICCTPCT in the last 72 hours. Urine analysis:    Component Value Date/Time   COLORURINE YELLOW 02/25/2016 0043   APPEARANCEUR CLEAR 02/25/2016 0043   LABSPEC <1.005 (L) 02/25/2016 16100043  PHURINE 7.0 02/25/2016 0043   GLUCOSEU NEGATIVE 02/25/2016 0043   HGBUR NEGATIVE 02/25/2016 0043   BILIRUBINUR NEGATIVE 02/25/2016 0043   KETONESUR NEGATIVE 02/25/2016 0043   PROTEINUR NEGATIVE 02/25/2016 0043   UROBILINOGEN 1.0 03/23/2014 1400   NITRITE NEGATIVE 02/25/2016 0043   LEUKOCYTESUR NEGATIVE 02/25/2016 0043   Sepsis Labs: @LABRCNTIP (procalcitonin:4,lacticidven:4) )No results found for this or any previous visit (from the past 240 hour(s)).   Radiological Exams on Admission: Dg Chest 2 View  Result Date: 02/24/2016 CLINICAL DATA:  Cough for 1 month EXAM: CHEST  2 VIEW COMPARISON:  10/03/2013 FINDINGS: Cardiac shadow is within normal limits. Lungs are well aerated bilaterally. Patchy infiltrate is noted in the right lung base and superimposed over chronic changes in the left mid lung. No sizable effusion is seen. No bony abnormality is noted. Diffuse chronic fibrotic changes are noted throughout both lungs. IMPRESSION: Acute on chronic infiltrate within the right base and left mid lung. Electronically Signed   By: Alcide Clever M.D.   On: 02/24/2016 20:40   Ct Angio Chest Pe W/cm &/or Wo Cm  Result Date: 02/24/2016 CLINICAL DATA:  45 year old  male with cough and shortness of breath and chest pain x1 month. EXAM: CT ANGIOGRAPHY CHEST WITH CONTRAST TECHNIQUE: Multidetector CT imaging of the chest was performed using the standard protocol during bolus administration of intravenous contrast. Multiplanar CT image reconstructions and MIPs were obtained to evaluate the vascular anatomy. CONTRAST:  80 cc Isovue 370 COMPARISON:  Chest radiograph dated 02/24/2016 FINDINGS: Cardiovascular: There is no cardiomegaly or pericardial effusion. The thoracic aorta appears unremarkable. The origins of the great vessels of the aortic arch appear patent. The evaluation for pulmonary artery is somewhat limited due to respiratory motion artifact. There is a small filling defect involving the right upper lobe pulmonary artery branch (series 5, image 120). The remainder of the visualized pulmonary arteries appear patent. Mediastinum/Nodes: There is right hilar adenopathy measuring up to 2 cm in short axis. Subcarinal adenopathy as well as aortopulmonic adenopathy noted. The esophagus is grossly unremarkable. No definite thyroid nodules identified. Lungs/Pleura: There is emphysematous changes of the lungs. There are bronchiectatic changes primarily involving the left lower lobe and left upper lobe. There is subpleural collapse with associated scarring in the apices bilaterally. There is a patchy and confluent ground-glass and nodular density primarily involving the right lower lobe most compatible with pneumonia. Left upper lobe interstitial thickening and ground-glass densities likely represent acute infection superimposed on a background of chronic changes. An area of nodular and ground-glass density also noted in the right middle lobe. Scattered nodularity in the right upper lobe most likely inflammatory/ infectious in etiology. There is no pleural effusion or pneumothorax. The central airways are patent. Upper Abdomen: No acute abnormality. Musculoskeletal: There is loss of  subcutaneous fat and cachexia. The osseous structures are intact. Review of the MIP images confirms the above findings. IMPRESSION: Right upper lobe small segmental pulmonary artery embolus. Multifocal pneumonia on a background of emphysema, chronic lung disease, and bronchiectatic changes. Clinical correlation and follow-up resolution recommended. Right hilar and mediastinal adenopathy, likely reactive. Clinical correlation follow-up recommended. Cachexia. These results were called by telephone at the time of interpretation on 02/24/2016 at 10:37 pm to physician assistant LEAPHART , who verbally acknowledged these results. Electronically Signed   By: Elgie Collard M.D.   On: 02/24/2016 22:40     Assessment/Plan Principal Problem:   Acute respiratory failure with hypoxia ([name]) Active Problems:   Multifocal pneumonia   AIDS (  [name])   PE (pulmonary thromboembolism) ([name])   Normochromic normocytic anemia   Pneumonia    1. Acute respiratory failure with hypoxia with multifocal pneumonia - given the elevated LDH patient has been placed on Bactrim for pneumocystis pneumonia. Will also add prednisone since patient is hypoxic. Patient has been also placed on ceftriaxone and Zithromax for community acquired pneumonia. Check sputum cultures blood cultures urine for Legionella and strep antigen and influenza PCR. Since patient has had previous history of TB patient is placed on airborne precautions and Quantiferron Gold Assay test has been ordered.  2. Pulmonary embolism - patient is placed on heparin. Check 2-D echo and Dopplers. 3. AIDS - patient states he has not been taking his medications for last few months. Check CD4 count and viral load. Consult infectious disease in a.m. 4. Anemia normocytic normochromic - check anemia panel. Follow CBC.   DVT prophylaxis: Heparin. Code Status: Full code.  Family Communication: Discussed with patient.  Disposition Plan: Home.  Consults called: None.    Admission status: Inpatient.    Eduard Clos MD [facility] Pager [phone].  If 7PM-7AM, please contact night-coverage [email or web address] Password [email or web address]  02/25/2016, 2:56 AM

## 2016-02-25 NOTE — Progress Notes (Signed)
Pharmacy Antibiotic Note  [name] is a 45 y.o. male with HIV, admitted on 02/24/2016 with SOB, possible PNA .  Pharmacy has been consulted for Septra dosing.  Plan: Septra 320 mg IV q8h  Height: 5\' 8"  (172.7 cm) Weight: 119 lb 14.9 oz (54.4 kg) (On standing zeroed out scale ) IBW/kg (Calculated) : 68.4  Temp (24hrs), Avg:98.6 F (37 C), Min:98.5 F (36.9 C), Max:98.6 F (37 C)   Recent Labs Lab 02/24/16 1858 02/24/16 1945  WBC 3.9*  --   CREATININE 1.01  --   LATICACIDVEN  --  1.47    Estimated Creatinine Clearance: 71.8 mL/min (by C-G formula based on SCr of 1.01 mg/dL).    No Known Allergies   Eddie Candlebbott, Anastazja Isaac Vernon 02/25/2016 3:09 AM

## 2016-02-25 NOTE — Progress Notes (Signed)
ANTICOAGULATION CONSULT NOTE - Initial Consult  Pharmacy Consult for Heparin  Indication: pulmonary embolus  No Known Allergies  Patient Measurements: Height: 5' 8.9" (175 cm) Weight: 171 lb 15.3 oz (78 kg) IBW/kg (Calculated) : 70.47  Vital Signs: Temp: 98.5 F (36.9 C) (12/11 1905) Temp Source: Oral (12/11 1905) BP: 118/85 (12/11 2315) Pulse Rate: 90 (12/11 2315)  Labs:  Recent Labs  02/24/16 1858  HGB 12.1*  HCT 35.5*  PLT 204  CREATININE 1.01    Estimated Creatinine Clearance: 93.1 mL/min (by C-G formula based on SCr of 1.01 mg/dL).   Medical History: Past Medical History:  Diagnosis Date  . Depression    "stress and depression for any man is common" (03/21/2014)  . Hepatitis    "I don't know what hepatitis I have"  . HIV disease ([name])   . TB (pulmonary tuberculosis)    previously treated according to refugee documentation     Assessment: Heparin for new onset PE, initially ordered Lovenox, verified with RN that Lovenox was NOT given, will start heparin. Hgb 12.1. Renal function good.   Goal of Therapy:  Heparin level 0.3-0.7 units/ml Monitor platelets by anticoagulation protocol: Yes   Plan:  -Heparin 5000 units BOLUS -Start heparin drip at 1500 units/hr -0800 HL -Daily CBC/HL -Monitor for bleeding  Abran DukeLedford, Carmelle Bamberg 02/25/2016,12:17 AM

## 2016-02-25 NOTE — Progress Notes (Signed)
Interpreter used to do assessment . md at bedside - spoke to interpreter . Pt admits not taking his hiv medication for 3mths . Pt has course lungs sounds with non - prductive cough . md suspects tb pt will now be put on airborne precautions . He is currently on heparin gtt for PE . He c/o of rib pain - denies n/v .   Pt is resting . No signs of distress . Advised to call for assist , call bell in reach bed in low position , water passed . interpreter - bernadette [phone] , then spoke a a man interpreter in which his name and id # was not captured

## 2016-02-25 NOTE — Progress Notes (Signed)
Initial Nutrition Assessment  DOCUMENTATION CODES:   Severe malnutrition in context of acute illness/injury, Underweight  INTERVENTION:  Provide Ensure Enlive po TID, each supplement provides 350 kcal and 20 grams of protein Provide Multivitamin with minerals daily  NUTRITION DIAGNOSIS:   Malnutrition related to acute illness, poor appetite as evidenced by percent weight loss, severe depletion of body fat, severe depletion of muscle mass.   GOAL:   Patient will meet greater than or equal to 90% of their needs   MONITOR:   PO intake, Supplement acceptance, Labs, Skin, I & O's  REASON FOR ASSESSMENT:   Malnutrition Screening Tool    ASSESSMENT:   45 y.o. male with history of AIDS last CD4 count in our system in January 2016 was 61 presents to the ER because of worsening shortness of breath. Patient states over the last 1 month, patient has been having increasing shortness of breath even at rest. Shortness of breath associated with pleuritic type of chest pain and productive cough. Denies any hemoptysis. Denies any fever or chills but has been losing weight. CT scan of the chest shows pulmonary embolism with multifocal pneumonia COPD and bronchiectasis  RD assessment done with assistance from [place] interpreter [phone] from telephonic interpreter services. Pt reports having a decreased appetite for the past month causing weight loss. He states that he usually weighs 75 kg. He reports eating a little less than usual and is unsure why he lost so much weight. He drank an Ensure earlier today, likes the taste and is agreeable to drinking 3 bottles daily. Per nursing notes, pt ate 75% of breakfast and 100% of lunch. Pt has severe muscle wasting and severe fat wasting per nutrition-focused physical exam. RD emphasized the importance of adequate nutrition intake; encouraged snacking between meals and intake of calorically dense beverages.   Labs: low TIBC, low iron, elevated ferritin, low  hemoglobin, low calcium, elevated alk phos  Diet Order:  Diet regular Room service appropriate? Yes; Fluid consistency: Thin  Skin:  Reviewed, no issues  Last BM:  PTA  Height:   Ht Readings from Last 1 Encounters:  02/25/16 _0  (1.727 m)    Weight:   Wt Readings from Last 1 Encounters:  02/25/16 120 lb 3.2 oz (54.5 kg)    Ideal Body Weight:  70 kg  BMI:  Body mass index is 18.28 kg/m.  Estimated Nutritional Needs:   Kcal:  9784-7841  Protein:  75-90 grams  Fluid:  1.9 L/day  EDUCATION NEEDS:   No education needs identified at this time  [place], CSP, LDN Inpatient Clinical Dietitian Pager: [phone] After Hours Pager: [phone]

## 2016-02-25 NOTE — Consult Note (Signed)
[facility]  Date of Admission:  02/24/2016  Date of Consult:  02/25/2016  Reason for Consult: AIDS, multifocal PNA Referring Physician: Dr. Posey Pronto.  Impression/Recommendation HIV AIDS (highly drug resistant virus)- repeat CD 4 pending. Last CD 4 was 60 03/2014. Acute PE - RUL small segmental PE - on heparin gtt Multifocal PNA - PCP pneumonia seems more likely given reticular pattern seen on imaging. Less likely to be CAP Right hilar and mediastinal adenopathy - likely reactive. Leukopenia - likely from AIDS Normocytic anemia  bcx pending Strep pneum UAG neg. Sputum culture pending.  Sputum AFB pending.  Would restart ART and give him anti nausea rx as needed.  Would recommend continuing treating as PCP pneumonia with bactrim. F/up sputum cx, if no yield then consider BAL. Agree with adjunctive prednisone although data for prednisone in HIV infected PCP patients if for pt's with SpO2 < 90% on RA or PaO2 < 60 on RA.  Get PCP DFA on sputum.   Thank you so much for this truly interesting consult,   Ahmed, Tasrif (pager) [phone] [email or web address]  [name] is an 46 y.o. male.  HPI: 45 yo M [place] M (in [place] since 2015)with hx of AIDS, previously treated TB (~ 6 years prior, pt unclear if LTBI or pulmonary) who presented with SOB for 1 month which was progressively worsening. He also had associated pleuritic chest pain and productive cough. No hemoptysis, fever, or chills. Did have some weight loss. CTA showed +PE and multifocal PNA. Had hx of previous TB. Had elevated LDH in ED. Was started on bactrim for possible PCP treatment along with ceftrx+azithromycin for CAP treatment along with some prednisone.   ART: darunavir/c, dolutegravir, zidovudine. Stopped taking his meds for over 4+ months due to nausea/upset stomach. Has not seen Dr. Baxter Flattery since 04/19/14.   Past Medical History:  Diagnosis Date  . Depression    "stress and  depression for any man is common" (03/21/2014)  . Hepatitis    "I don't know what hepatitis I have"  . HIV disease ([place])   . TB (pulmonary tuberculosis)    previously treated according to refugee documentation    Past Surgical History:  Procedure Laterality Date  . FRACTURE SURGERY    . IM NAILING TIBIA Right 03/21/2014  . ORIF ANKLE FRACTURE Right 03/21/2014   lateral malleolus/notes 03/21/2014  . ORIF ANKLE FRACTURE Right 03/21/2014   Procedure: OPEN REDUCTION INTERNAL FIXATION (ORIF) pilon ;  Surgeon: Marybelle Killings, MD;  Location: [facility];  Service: Orthopedics;  Laterality: Right;  . TIBIA IM NAIL INSERTION Right 03/21/2014   Procedure: INTRAMEDULLARY (IM) NAIL TIBIAL;  Surgeon: Marybelle Killings, MD;  Location: [facility];  Service: Orthopedics;  Laterality: Right;     No Known Allergies  Medications: I have reviewed the patient's current medications.  Abtx:  Anti-infectives    Start     Dose/Rate Route Frequency Ordered Stop   02/25/16 2200  cefTRIAXone (ROCEPHIN) 1 g in dextrose 5 % 50 mL IVPB     1 g 100 mL/hr over 30 Minutes Intravenous Every 24 hours 02/25/16 0255 03/03/16 2159   02/25/16 2200  azithromycin (ZITHROMAX) 500 mg in dextrose 5 % 250 mL IVPB     500 mg 250 mL/hr over 60 Minutes Intravenous Every 24 hours 02/25/16 0255 03/03/16 2159   02/25/16 0400  sulfamethoxazole-trimethoprim (BACTRIM) 320 mg in dextrose 5 % 500 mL IVPB     320 mg 346.7 mL/hr  over 90 Minutes Intravenous Every 8 hours 02/25/16 0309     02/24/16 2300  cefTRIAXone (ROCEPHIN) 1 g in dextrose 5 % 50 mL IVPB     1 g 100 mL/hr over 30 Minutes Intravenous  Once 02/24/16 2245 02/25/16 0035   02/24/16 2300  azithromycin (ZITHROMAX) 500 mg in dextrose 5 % 250 mL IVPB     500 mg 250 mL/hr over 60 Minutes Intravenous  Once 02/24/16 2245 02/25/16 0032      Total days of antibiotics: ceftrx + azithromycin+ bactrim 1 day          Social History:  reports that he has quit smoking. His smoking use included Cigarettes. He  has a 22.00 pack-year smoking history. He has never used smokeless tobacco. He reports that he does not drink alcohol or use drugs.  Family History  Problem Relation Age of Onset  . Hypertension Other     Review of Systems  Constitutional: Positive for malaise/fatigue. Negative for chills and fever.  Eyes: Negative for blurred vision and double vision.  Respiratory: Positive for cough, sputum production and shortness of breath. Negative for hemoptysis.   Cardiovascular: Positive for chest pain. Negative for palpitations and claudication.  Gastrointestinal: Negative for abdominal pain, diarrhea, nausea and vomiting.  Genitourinary: Negative for dysuria.  Musculoskeletal: Negative for myalgias.  15 kg wt loss, no oral ulcers or dysphagia.    Blood pressure (!) 99/57, pulse 94, temperature 98.9 F (37.2 C), temperature source Oral, resp. rate 18, height _0  (1.727 m), weight 120 lb 3.2 oz (54.5 kg), SpO2 90 %. Physical Exam  Constitutional: No distress.  Cachectic appearing male. Only speaks [place].   HENT:  Head: Normocephalic and atraumatic.  Mouth/Throat: No oropharyngeal exudate.  Eyes: Conjunctivae are normal. Pupils are equal, round, and reactive to light. No scleral icterus.  Neck: Normal range of motion.  Cardiovascular: Normal rate and regular rhythm.  Exam reveals no gallop and no friction rub.   No murmur heard. Respiratory: Effort normal. No respiratory distress. He has no wheezes. He exhibits no tenderness.  Crackles on b/l bases.   GI: Soft. Bowel sounds are normal. He exhibits no distension. There is no tenderness.  Musculoskeletal: Normal range of motion. He exhibits no edema or deformity.  Skin: He is not diaphoretic.    Results for orders placed or performed during the hospital encounter of 02/24/16 (from the past 48 hour(s))  Comprehensive metabolic panel     Status: Abnormal   Collection Time: 02/24/16  6:58 PM  Result Value Ref Range   Sodium 135 135 - 145  mmol/L   Potassium 3.7 3.5 - 5.1 mmol/L   Chloride 100 (L) 101 - 111 mmol/L   CO2 28 22 - 32 mmol/L   Glucose, Bld 138 (H) 65 - 99 mg/dL   BUN 11 6 - 20 mg/dL   Creatinine, Ser 1.01 0.61 - 1.24 mg/dL   Calcium 9.0 8.9 - 10.3 mg/dL   Total Protein 8.8 (H) 6.5 - 8.1 g/dL   Albumin 2.3 (L) 3.5 - 5.0 g/dL   AST 27 15 - 41 U/L   ALT 10 (L) 17 - 63 U/L   Alkaline Phosphatase 597 (H) 38 - 126 U/L   Total Bilirubin 0.5 0.3 - 1.2 mg/dL   GFR calc non Af Amer >60 >60 mL/min   GFR calc Af Amer >60 >60 mL/min    Comment: (NOTE) The eGFR has been calculated using the CKD EPI equation. This calculation has not been  validated in all clinical situations. eGFR's persistently <60 mL/min signify possible Chronic Kidney Disease.    Anion gap 7 5 - 15  CBC with Differential     Status: Abnormal   Collection Time: 02/24/16  6:58 PM  Result Value Ref Range   WBC 3.9 (L) 4.0 - 10.5 K/uL   RBC 3.64 (L) 4.22 - 5.81 MIL/uL   Hemoglobin 12.1 (L) 13.0 - 17.0 g/dL   HCT 35.5 (L) 39.0 - 52.0 %   MCV 97.5 78.0 - 100.0 fL   MCH 33.2 26.0 - 34.0 pg   MCHC 34.1 30.0 - 36.0 g/dL   RDW 11.6 11.5 - 15.5 %   Platelets 204 150 - 400 K/uL   Neutrophils Relative % 59 %   Neutro Abs 2.3 1.7 - 7.7 K/uL   Lymphocytes Relative 20 %   Lymphs Abs 0.8 0.7 - 4.0 K/uL   Monocytes Relative 19 %   Monocytes Absolute 0.7 0.1 - 1.0 K/uL   Eosinophils Relative 1 %   Eosinophils Absolute 0.1 0.0 - 0.7 K/uL   Basophils Relative 1 %   Basophils Absolute 0.0 0.0 - 0.1 K/uL  I-Stat beta hCG blood, ED     Status: None   Collection Time: 02/24/16  7:43 PM  Result Value Ref Range   I-stat hCG, quantitative <5.0 <5 mIU/mL   Comment 3            Comment:   GEST. AGE      CONC.  (mIU/mL)   <=1 WEEK        5 - 50     2 WEEKS       50 - 500     3 WEEKS       100 - 10,000     4 WEEKS     1,000 - 30,000        MALE AND NON-PREGNANT MALE:     LESS THAN 5 mIU/mL   I-Stat CG4 Lactic Acid, ED     Status: None   Collection Time:  02/24/16  7:45 PM  Result Value Ref Range   Lactic Acid, Venous 1.47 0.5 - 1.9 mmol/L  D-dimer, quantitative (not at [facility])     Status: Abnormal   Collection Time: 02/24/16  7:47 PM  Result Value Ref Range   D-Dimer, Quant 1.17 (H) 0.00 - 0.50 ug/mL-FEU    Comment: (NOTE) At the manufacturer cut-off of 0.50 ug/mL FEU, this assay has been documented to exclude PE with a sensitivity and negative predictive value of 97 to 99%.  At this time, this assay has not been approved by the FDA to exclude DVT/VTE. Results should be correlated with clinical presentation.   Lactate dehydrogenase     Status: Abnormal   Collection Time: 02/24/16 11:26 PM  Result Value Ref Range   LDH 419 (H) 98 - 192 U/L    Comment: SPECIMEN HEMOLYZED. HEMOLYSIS MAY AFFECT INTEGRITY OF RESULTS.  Strep pneumoniae urinary antigen     Status: None   Collection Time: 02/25/16 12:39 AM  Result Value Ref Range   Strep Pneumo Urinary Antigen NEGATIVE NEGATIVE    Comment:        Infection due to S. pneumoniae cannot be absolutely ruled out since the antigen present may be below the detection limit of the test.   Urinalysis, Routine w reflex microscopic     Status: Abnormal   Collection Time: 02/25/16 12:43 AM  Result Value Ref Range   Color, Urine YELLOW  YELLOW   APPearance CLEAR CLEAR   Specific Gravity, Urine <1.005 (L) 1.005 - 1.030   pH 7.0 5.0 - 8.0   Glucose, UA NEGATIVE NEGATIVE mg/dL   Hgb urine dipstick NEGATIVE NEGATIVE   Bilirubin Urine NEGATIVE NEGATIVE   Ketones, ur NEGATIVE NEGATIVE mg/dL   Protein, ur NEGATIVE NEGATIVE mg/dL   Nitrite NEGATIVE NEGATIVE   Leukocytes, UA NEGATIVE NEGATIVE    Comment: Microscopic not done on urines with negative protein, blood, leukocytes, nitrite, or glucose < 500 mg/dL.  Comprehensive metabolic panel     Status: Abnormal   Collection Time: 02/25/16  3:28 AM  Result Value Ref Range   Sodium 136 135 - 145 mmol/L   Potassium 3.6 3.5 - 5.1 mmol/L   Chloride 103 101  - 111 mmol/L   CO2 27 22 - 32 mmol/L   Glucose, Bld 95 65 - 99 mg/dL   BUN 9 6 - 20 mg/dL   Creatinine, Ser 0.86 0.61 - 1.24 mg/dL   Calcium 8.5 (L) 8.9 - 10.3 mg/dL   Total Protein 7.5 6.5 - 8.1 g/dL   Albumin 2.0 (L) 3.5 - 5.0 g/dL   AST 22 15 - 41 U/L   ALT 9 (L) 17 - 63 U/L   Alkaline Phosphatase 485 (H) 38 - 126 U/L   Total Bilirubin 0.4 0.3 - 1.2 mg/dL   GFR calc non Af Amer >60 >60 mL/min   GFR calc Af Amer >60 >60 mL/min    Comment: (NOTE) The eGFR has been calculated using the CKD EPI equation. This calculation has not been validated in all clinical situations. eGFR's persistently <60 mL/min signify possible Chronic Kidney Disease.    Anion gap 6 5 - 15  CBC WITH DIFFERENTIAL     Status: Abnormal   Collection Time: 02/25/16  3:28 AM  Result Value Ref Range   WBC 2.4 (L) 4.0 - 10.5 K/uL   RBC 3.29 (L) 4.22 - 5.81 MIL/uL   Hemoglobin 10.6 (L) 13.0 - 17.0 g/dL   HCT 32.6 (L) 39.0 - 52.0 %   MCV 99.1 78.0 - 100.0 fL   MCH 32.2 26.0 - 34.0 pg   MCHC 32.5 30.0 - 36.0 g/dL   RDW 11.9 11.5 - 15.5 %   Platelets 182 150 - 400 K/uL   Neutrophils Relative % 49 %   Neutro Abs 1.2 (L) 1.7 - 7.7 K/uL   Lymphocytes Relative 27 %   Lymphs Abs 0.6 (L) 0.7 - 4.0 K/uL   Monocytes Relative 21 %   Monocytes Absolute 0.5 0.1 - 1.0 K/uL   Eosinophils Relative 3 %   Eosinophils Absolute 0.1 0.0 - 0.7 K/uL   Basophils Relative 0 %   Basophils Absolute 0.0 0.0 - 0.1 K/uL  Vitamin B12     Status: None   Collection Time: 02/25/16  3:28 AM  Result Value Ref Range   Vitamin B-12 291 180 - 914 pg/mL    Comment: (NOTE) This assay is not validated for testing neonatal or myeloproliferative syndrome specimens for Vitamin B12 levels.   Folate     Status: None   Collection Time: 02/25/16  3:28 AM  Result Value Ref Range   Folate 11.3 >5.9 ng/mL  Iron and TIBC     Status: Abnormal   Collection Time: 02/25/16  3:28 AM  Result Value Ref Range   Iron 13 (L) 45 - 182 ug/dL   TIBC 181 (L)  250 - 450 ug/dL   Saturation Ratios 7 (L)  17.9 - 39.5 %   UIBC 168 ug/dL  Ferritin     Status: Abnormal   Collection Time: 02/25/16  3:28 AM  Result Value Ref Range   Ferritin 616 (H) 24 - 336 ng/mL  Reticulocytes     Status: Abnormal   Collection Time: 02/25/16  3:28 AM  Result Value Ref Range   Retic Ct Pct 0.7 0.4 - 3.1 %   RBC. 3.29 (L) 4.22 - 5.81 MIL/uL   Retic Count, Manual 23.0 19.0 - 186.0 K/uL  Troponin I (q 6hr x 3)     Status: None   Collection Time: 02/25/16  6:28 AM  Result Value Ref Range   Troponin I <0.03 <0.03 ng/mL  Heparin level (unfractionated)     Status: None   Collection Time: 02/25/16  7:17 AM  Result Value Ref Range   Heparin Unfractionated 0.58 0.30 - 0.70 IU/mL    Comment:        IF HEPARIN RESULTS ARE BELOW EXPECTED VALUES, AND PATIENT DOSAGE HAS BEEN CONFIRMED, SUGGEST FOLLOW UP TESTING OF ANTITHROMBIN III LEVELS.    No results found for: SDES, SPECREQUEST, CULT, REPTSTATUS Dg Chest 2 View  Result Date: 02/24/2016 CLINICAL DATA:  Cough for 1 month EXAM: CHEST  2 VIEW COMPARISON:  10/03/2013 FINDINGS: Cardiac shadow is within normal limits. Lungs are well aerated bilaterally. Patchy infiltrate is noted in the right lung base and superimposed over chronic changes in the left mid lung. No sizable effusion is seen. No bony abnormality is noted. Diffuse chronic fibrotic changes are noted throughout both lungs. IMPRESSION: Acute on chronic infiltrate within the right base and left mid lung. Electronically Signed   By: Inez Catalina M.D.   On: 02/24/2016 20:40   Ct Angio Chest Pe W/cm &/or Wo Cm  Result Date: 02/24/2016 CLINICAL DATA:  45 year old male with cough and shortness of breath and chest pain x1 month. EXAM: CT ANGIOGRAPHY CHEST WITH CONTRAST TECHNIQUE: Multidetector CT imaging of the chest was performed using the standard protocol during bolus administration of intravenous contrast. Multiplanar CT image reconstructions and MIPs were obtained to  evaluate the vascular anatomy. CONTRAST:  80 cc Isovue 370 COMPARISON:  Chest radiograph dated 02/24/2016 FINDINGS: Cardiovascular: There is no cardiomegaly or pericardial effusion. The thoracic aorta appears unremarkable. The origins of the great vessels of the aortic arch appear patent. The evaluation for pulmonary artery is somewhat limited due to respiratory motion artifact. There is a small filling defect involving the right upper lobe pulmonary artery branch (series 5, image 120). The remainder of the visualized pulmonary arteries appear patent. Mediastinum/Nodes: There is right hilar adenopathy measuring up to 2 cm in short axis. Subcarinal adenopathy as well as aortopulmonic adenopathy noted. The esophagus is grossly unremarkable. No definite thyroid nodules identified. Lungs/Pleura: There is emphysematous changes of the lungs. There are bronchiectatic changes primarily involving the left lower lobe and left upper lobe. There is subpleural collapse with associated scarring in the apices bilaterally. There is a patchy and confluent ground-glass and nodular density primarily involving the right lower lobe most compatible with pneumonia. Left upper lobe interstitial thickening and ground-glass densities likely represent acute infection superimposed on a background of chronic changes. An area of nodular and ground-glass density also noted in the right middle lobe. Scattered nodularity in the right upper lobe most likely inflammatory/ infectious in etiology. There is no pleural effusion or pneumothorax. The central airways are patent. Upper Abdomen: No acute abnormality. Musculoskeletal: There is loss of subcutaneous fat and  cachexia. The osseous structures are intact. Review of the MIP images confirms the above findings. IMPRESSION: Right upper lobe small segmental pulmonary artery embolus. Multifocal pneumonia on a background of emphysema, chronic lung disease, and bronchiectatic changes. Clinical correlation  and follow-up resolution recommended. Right hilar and mediastinal adenopathy, likely reactive. Clinical correlation follow-up recommended. Cachexia. These results were called by telephone at the time of interpretation on 02/24/2016 at 10:37 pm to physician assistant Cross [place] , who verbally acknowledged these results. Electronically Signed   By: Anner Crete M.D.   On: 02/24/2016 22:40   No results found for this or any previous visit (from the past 240 hour(s)).    02/25/2016, 11:12 AM     LOS: 1 day    Records and images were personally reviewed where available.

## 2016-02-25 NOTE — Progress Notes (Signed)
Pt transferred to 3e via wheelchair with Rn and tech to asst . Pt on 3lnc - via portable 02. Heparin infusing at 315ml/hr . Pt show no sign of distress . Iv 's intact . Pt stable

## 2016-02-25 NOTE — Progress Notes (Addendum)
ANTICOAGULATION CONSULT NOTE - Follow-up Consult   Pharmacy Consult for Heparin  Indication: pulmonary embolus  No Known Allergies  Patient Measurements: Height: 5\' 8"  (172.7 cm) Weight: 120 lb 3.2 oz (54.5 kg) (c scale) IBW/kg (Calculated) : 68.4  Vital Signs: Temp: 98.9 F (37.2 C) (12/12 0813) Temp Source: Oral (12/12 0813) BP: 99/57 (12/12 0813) Pulse Rate: 94 (12/12 0813)  Labs:  Recent Labs  02/24/16 1858 02/25/16 0328 02/25/16 0628 02/25/16 0717  HGB 12.1* 10.6*  --   --   HCT 35.5* 32.6*  --   --   PLT 204 182  --   --   HEPARINUNFRC  --   --   --  0.58  CREATININE 1.01 0.86  --   --   TROPONINI  --   --  <0.03  --     Estimated Creatinine Clearance: 84.5 mL/min (by C-G formula based on SCr of 0.86 mg/dL).   Medical History: Past Medical History:  Diagnosis Date  . Depression    "stress and depression for any man is common" (03/21/2014)  . Hepatitis    "I don't know what hepatitis I have"  . HIV disease ([name])   . TB (pulmonary tuberculosis)    previously treated according to refugee documentation     Assessment: 45 yo male with history of HIV with new onset PE. Pharmacy has been consulted for heparin dosing. Vascular ultrasound shows no signs of DVT.  First heparin level this AM is therapeutic at 0.58 on 1500 units/hr. CBC is stable and no signs of bleeding have been noted in the chart.   Goal of Therapy:  Heparin level 0.3-0.7 units/ml Monitor platelets by anticoagulation protocol: Yes   Plan:  Continue heparin at 1500 units/hr  6 hour confirmatory heparin level  Daily CBC/Heparin level Monitor for bleeding  Hillis RangeEmily A Stewart, PharmD PGY1 Pharmacy Resident Pager: [phone]  02/25/2016,9:53 AM    Confirmatory heparin level remains therapeutic at 0.62. Will re-check with AM labs.  Bailey MechEmily Stewart, PharmD Pager: [phone]

## 2016-02-25 NOTE — Progress Notes (Signed)
*  PRELIMINARY RESULTS* Vascular Ultrasound Lower extremity venous duplex has been completed.  Preliminary findings: No evidence of DVT or baker's cyst.  Farrel DemarkJill Eunice, RDMS, RVT  02/25/2016, 9:25 AM

## 2016-02-25 NOTE — Progress Notes (Signed)
[facility] PLAN OF CARE NOTE Patient: [name] ZOX:[id number]   PCP: [name],JEFF, MD DOB: 11/18/1970   DOA: 02/24/2016   DOS: 02/25/2016    Patient was admitted by my colleague Dr. Toniann FailKakrakandy earlier on 02/25/2016. I have reviewed the H&P as well as assessment and plan and agree with the same. Important changes in the plan are listed below.  Plan of care: Principal Problem:   Acute respiratory failure with hypoxia ([name]) Active Problems:   Multifocal pneumonia   AIDS ([name])   PE (pulmonary thromboembolism) ([name])   Normochromic normocytic anemia   Pneumonia   Protein-calorie malnutrition, severe Infectious disease consulted. Continue current care. 1Lit IV fluid bolus.   Author: Lynden OxfordPranav Thierno Hun, MD [facility] Pager: [phone] 02/25/2016 4:42 PM   If 7PM-7AM, please contact night-coverage at [email or web address], password [email or web address]

## 2016-02-26 DIAGNOSIS — Z1624 Resistance to multiple antibiotics: Secondary | ICD-10-CM

## 2016-02-26 DIAGNOSIS — D649 Anemia, unspecified: Secondary | ICD-10-CM

## 2016-02-26 DIAGNOSIS — R590 Localized enlarged lymph nodes: Secondary | ICD-10-CM

## 2016-02-26 DIAGNOSIS — D72819 Decreased white blood cell count, unspecified: Secondary | ICD-10-CM

## 2016-02-26 LAB — EXPECTORATED SPUTUM ASSESSMENT W GRAM STAIN, RFLX TO RESP C

## 2016-02-26 LAB — COMPREHENSIVE METABOLIC PANEL
ALT: 9 U/L — AB (ref 17–63)
AST: 21 U/L (ref 15–41)
Albumin: 1.9 g/dL — ABNORMAL LOW (ref 3.5–5.0)
Alkaline Phosphatase: 461 U/L — ABNORMAL HIGH (ref 38–126)
Anion gap: 5 (ref 5–15)
BUN: 16 mg/dL (ref 6–20)
CHLORIDE: 106 mmol/L (ref 101–111)
CO2: 27 mmol/L (ref 22–32)
CREATININE: 0.99 mg/dL (ref 0.61–1.24)
Calcium: 9.3 mg/dL (ref 8.9–10.3)
GFR calc non Af Amer: >60 mL/min (ref >60)
Glucose, Bld: 110 mg/dL — ABNORMAL HIGH (ref 65–99)
POTASSIUM: 4.7 mmol/L (ref 3.5–5.1)
SODIUM: 138 mmol/L (ref 135–145)
Total Bilirubin: 0.4 mg/dL (ref 0.3–1.2)
Total Protein: 7.4 g/dL (ref 6.5–8.1)

## 2016-02-26 LAB — CBC WITH DIFFERENTIAL/PLATELET
Basophils Absolute: 0 10*3/uL (ref 0.0–0.1)
Basophils Relative: 0 %
EOS ABS: 0 10*3/uL (ref 0.0–0.7)
Eosinophils Relative: 0 %
HEMATOCRIT: 34.2 % — AB (ref 39.0–52.0)
HEMOGLOBIN: 11.3 g/dL — AB (ref 13.0–17.0)
LYMPHS ABS: 0.5 10*3/uL — AB (ref 0.7–4.0)
LYMPHS PCT: 14 %
MCH: 31.9 pg (ref 26.0–34.0)
MCHC: 33 g/dL (ref 30.0–36.0)
MCV: 96.6 fL (ref 78.0–100.0)
MONOS PCT: 17 %
Monocytes Absolute: 0.7 10*3/uL (ref 0.1–1.0)
NEUTROS PCT: 69 %
Neutro Abs: 2.6 10*3/uL (ref 1.7–7.7)
Platelets: 235 10*3/uL (ref 150–400)
RBC: 3.54 MIL/uL — ABNORMAL LOW (ref 4.22–5.81)
RDW: 11.5 % (ref 11.5–15.5)
WBC: 3.8 10*3/uL — ABNORMAL LOW (ref 4.0–10.5)

## 2016-02-26 LAB — PNEUMOCYSTIS JIROVECI SMEAR BY DFA: Pneumocystis jiroveci Ag: NEGATIVE

## 2016-02-26 LAB — LEGIONELLA PNEUMOPHILA SEROGP 1 UR AG: L. pneumophila Serogp 1 Ur Ag: NEGATIVE

## 2016-02-26 LAB — HIV-1 RNA QUANT-NO REFLEX-BLD
HIV 1 RNA QUANT: 54000 {copies}/mL
LOG10 HIV-1 RNA: 4.732 {Log_copies}/mL

## 2016-02-26 LAB — EXPECTORATED SPUTUM ASSESSMENT W REFEX TO RESP CULTURE

## 2016-02-26 LAB — URINE CULTURE

## 2016-02-26 LAB — ACID FAST SMEAR (AFB, MYCOBACTERIA): Acid Fast Smear: NEGATIVE

## 2016-02-26 LAB — ACID FAST SMEAR (AFB)

## 2016-02-26 LAB — HIV 1/2 AB DIFFERENTIATION
HIV 1 Ab: POSITIVE — AB
HIV 2 AB: NEGATIVE

## 2016-02-26 LAB — HEPARIN LEVEL (UNFRACTIONATED)
Heparin Unfractionated: 0.78 IU/mL — ABNORMAL HIGH (ref 0.30–0.70)
Heparin Unfractionated: 0.93 IU/mL — ABNORMAL HIGH (ref 0.30–0.70)

## 2016-02-26 LAB — HIV ANTIBODY (ROUTINE TESTING W REFLEX)

## 2016-02-26 LAB — MAGNESIUM: MAGNESIUM: 1.9 mg/dL (ref 1.7–2.4)

## 2016-02-26 MED ORDER — WARFARIN - PHARMACIST DOSING INPATIENT
Freq: Every day | Status: DC
  Administered 2016-02-26 – 2016-03-01 (×5)

## 2016-02-26 MED ORDER — ENOXAPARIN SODIUM 60 MG/0.6ML ~~LOC~~ SOLN
1.0000 mg/kg | Freq: Two times a day (BID) | SUBCUTANEOUS | Status: DC
  Administered 2016-02-26 – 2016-03-03 (×12): 55 mg via SUBCUTANEOUS
  Filled 2016-02-26 (×12): qty 0.6

## 2016-02-26 MED ORDER — WARFARIN SODIUM 3 MG PO TABS
3.0000 mg | ORAL_TABLET | Freq: Once | ORAL | Status: AC
  Administered 2016-02-26: 3 mg via ORAL
  Filled 2016-02-26: qty 1

## 2016-02-26 NOTE — Progress Notes (Addendum)
INFECTIOUS DISEASE PROGRESS NOTE  ID: [name] is Anthony 45 y.o. male with  Principal Problem:   Acute respiratory failure with hypoxia ([name]) Active Problems:   Multifocal pneumonia   AIDS ([name])   PE (pulmonary thromboembolism) ([name])   Normochromic normocytic anemia   Pneumonia   Protein-calorie malnutrition, severe  Subjective: No complaints  Abtx:  Anti-infectives    Start     Dose/Rate Route Frequency Ordered Stop   02/26/16 0700  darunavir-cobicistat (PREZCOBIX) 800-150 MG per tablet 1 tablet     1 tablet Oral Daily with breakfast 02/25/16 1453     02/25/16 2200  cefTRIAXone (ROCEPHIN) 1 g in dextrose 5 % 50 mL IVPB  Status:  Discontinued     1 g 100 mL/hr over 30 Minutes Intravenous Every 24 hours 02/25/16 0255 02/25/16 1453   02/25/16 2200  azithromycin (ZITHROMAX) 500 mg in dextrose 5 % 250 mL IVPB  Status:  Discontinued     500 mg 250 mL/hr over 60 Minutes Intravenous Every 24 hours 02/25/16 0255 02/25/16 1453   02/25/16 1530  zidovudine (RETROVIR) capsule 100 mg     100 mg Oral Every 8 hours 02/25/16 1453     02/25/16 1500  dolutegravir (TIVICAY) tablet 50 mg     50 mg Oral 2 times daily 02/25/16 1453     02/25/16 0400  sulfamethoxazole-trimethoprim (BACTRIM) 320 mg in dextrose 5 % 500 mL IVPB     320 mg 346.7 mL/hr over 90 Minutes Intravenous Every 8 hours 02/25/16 0309     02/24/16 2300  cefTRIAXone (ROCEPHIN) 1 g in dextrose 5 % 50 mL IVPB     1 g 100 mL/hr over 30 Minutes Intravenous  Once 02/24/16 2245 02/25/16 0035   02/24/16 2300  azithromycin (ZITHROMAX) 500 mg in dextrose 5 % 250 mL IVPB     500 mg 250 mL/hr over 60 Minutes Intravenous  Once 02/24/16 2245 02/25/16 2049      Medications:  Scheduled: . darunavir-cobicistat  1 tablet Oral Q breakfast  . dolutegravir  50 mg Oral BID  . feeding supplement (ENSURE ENLIVE)  237 mL Oral TID BM  . Influenza vac split quadrivalent PF  0.5 mL Intramuscular Tomorrow-1000  . multivitamin with minerals  1  tablet Oral Daily  . pneumococcal 23 valent vaccine  0.5 mL Intramuscular Tomorrow-1000  . predniSONE  60 mg Oral Q breakfast  . sulfamethoxazole-trimethoprim  320 mg Intravenous Q8H  . zidovudine  100 mg Oral Q8H    Objective: Vital signs in last 24 hours: Temp:  [97.8 F (36.6 C)-98.3 F (36.8 C)] 98.1 F (36.7 C) (12/13 0545) Pulse Rate:  [90-91] 91 (12/13 0545) Resp:  [16-18] 18 (12/13 0545) BP: (80-106)/(50-67) 106/67 (12/13 0545) SpO2:  [90 %-92 %] 91 % (12/13 0545) Weight:  [55.2 kg (121 lb 9.6 oz)] 55.2 kg (121 lb 9.6 oz) (12/13 0545)   General appearance: alert and no distress Resp: clear to auscultation bilaterally Cardio: regular rate and rhythm GI: normal findings: bowel sounds normal and soft, non-tender  Lab Results  Recent Labs  02/25/16 0328 02/26/16 0450  WBC 2.4* 3.8*  HGB 10.6* 11.3*  HCT 32.6* 34.2*  NA 136 138  K 3.6 4.7  CL 103 106  CO2 27 27  BUN 9 16  CREATININE 0.86 0.99   Liver Panel  Recent Labs  02/25/16 0328 02/26/16 0450  PROT 7.5 7.4  ALBUMIN 2.0* 1.9*  AST 22 21  ALT 9* 9*  ALKPHOS  485* 461*  BILITOT 0.4 0.4   Sedimentation Rate No results for input(s): ESRSEDRATE in the last 72 hours. C-Reactive Protein No results for input(s): CRP in the last 72 hours.  Microbiology: Recent Results (from the past 240 hour(s))  Culture, blood (Routine x 2)     Status: None (Preliminary result)   Collection Time: 02/24/16  7:20 PM  Result Value Ref Range Status   Specimen Description BLOOD RIGHT ANTECUBITAL  Final   Special Requests IN PEDIATRIC BOTTLE 1CC  Final   Culture NO GROWTH < 24 HOURS  Final   Report Status PENDING  Incomplete  Culture, blood (Routine x 2)     Status: None (Preliminary result)   Collection Time: 02/24/16  7:28 PM  Result Value Ref Range Status   Specimen Description BLOOD LEFT ANTECUBITAL  Final   Special Requests BOTTLES DRAWN AEROBIC AND ANAEROBIC 5CC  Final   Culture NO GROWTH < 24 HOURS  Final    Report Status PENDING  Incomplete  Urine culture     Status: Abnormal   Collection Time: 02/25/16 12:39 AM  Result Value Ref Range Status   Specimen Description URINE, RANDOM  Final   Special Requests NONE  Final   Culture MULTIPLE SPECIES PRESENT, SUGGEST RECOLLECTION (Anthony)  Final   Report Status 02/26/2016 FINAL  Final  Culture, sputum-assessment     Status: None   Collection Time: 02/25/16  2:54 AM  Result Value Ref Range Status   Specimen Description EXPECTORATED SPUTUM  Final   Special Requests NONE  Final   Sputum evaluation   Final    MICROSCOPIC FINDINGS SUGGEST THAT THIS SPECIMEN IS NOT REPRESENTATIVE OF LOWER RESPIRATORY SECRETIONS. PLEASE RECOLLECIhor Parsons. [name] 02/26/16 [name]    Report Status 02/26/2016 FINAL  Final    Studies/Results: Dg Chest 2 View  Result Date: 02/24/2016 CLINICAL DATA:  Cough for 1 month EXAM: CHEST  2 VIEW COMPARISON:  10/03/2013 FINDINGS: Cardiac shadow is within normal limits. Lungs are well aerated bilaterally. Patchy infiltrate is noted in the right lung base and superimposed over chronic changes in the left mid lung. No sizable effusion is seen. No bony abnormality is noted. Diffuse chronic fibrotic changes are noted throughout both lungs. IMPRESSION: Acute on chronic infiltrate within the right base and left mid lung. Electronically Signed   By: Alcide CleverMark  Lukens M.D.   On: 02/24/2016 20:40   Ct Angio Chest Pe W/cm &/or Wo Cm  Result Date: 02/24/2016 CLINICAL DATA:  45 year old male with cough and shortness of breath and chest pain x1 month. EXAM: CT ANGIOGRAPHY CHEST WITH CONTRAST TECHNIQUE: Multidetector CT imaging of the chest was performed using the standard protocol during bolus administration of intravenous contrast. Multiplanar CT image reconstructions and MIPs were obtained to evaluate the vascular anatomy. CONTRAST:  80 cc Isovue 370 COMPARISON:  Chest radiograph dated 02/24/2016 FINDINGS: Cardiovascular: There is no cardiomegaly or  pericardial effusion. The thoracic aorta appears unremarkable. The origins of the great vessels of the aortic arch appear patent. The evaluation for pulmonary artery is somewhat limited due to respiratory motion artifact. There is Anthony small filling defect involving the right upper lobe pulmonary artery branch (series 5, image 120). The remainder of the visualized pulmonary arteries appear patent. Mediastinum/Nodes: There is right hilar adenopathy measuring up to 2 cm in short axis. Subcarinal adenopathy as well as aortopulmonic adenopathy noted. The esophagus is grossly unremarkable. No definite thyroid nodules identified. Lungs/Pleura: There is emphysematous changes of the lungs. There are  bronchiectatic changes primarily involving the left lower lobe and left upper lobe. There is subpleural collapse with associated scarring in the apices bilaterally. There is Anthony patchy and confluent ground-glass and nodular density primarily involving the right lower lobe most compatible with pneumonia. Left upper lobe interstitial thickening and ground-glass densities likely represent acute infection superimposed on Anthony background of chronic changes. An area of nodular and ground-glass density also noted in the right middle lobe. Scattered nodularity in the right upper lobe most likely inflammatory/ infectious in etiology. There is no pleural effusion or pneumothorax. The central airways are patent. Upper Abdomen: No acute abnormality. Musculoskeletal: There is loss of subcutaneous fat and cachexia. The osseous structures are intact. Review of the MIP images confirms the above findings. IMPRESSION: Right upper lobe small segmental pulmonary artery embolus. Multifocal pneumonia on Anthony background of emphysema, chronic lung disease, and bronchiectatic changes. Clinical correlation and follow-up resolution recommended. Right hilar and mediastinal adenopathy, likely reactive. Clinical correlation follow-up recommended. Cachexia. These  results were called by telephone at the time of interpretation on 02/24/2016 at 10:37 pm to physician assistant LEAPHART , who verbally acknowledged these results. Electronically Signed   By: Elgie Collard M.D.   On: 02/24/2016 22:40     Assessment/Plan: Multifocal pneumonia MDR AIDS Acute PE Suspected PCP  Would be very difficult to change his ART. He has multiple drug resistance mutations.  Will watch his tolerance Await results of PCP DFA, AFB and sputum Cx.  Continue PCP therapy  Total days of antibiotics: bactrim 2         Johny Sax Infectious Diseases (pager) [phone] [email or web address] 02/26/2016, 9:42 AM  LOS: 2 days

## 2016-02-26 NOTE — Progress Notes (Signed)
INFECTIOUS DISEASE PROGRESS NOTE  ID: [name] is a 45 y.o. male with  Principal Problem:   Acute respiratory failure with hypoxia ([name]) Active Problems:   Multifocal pneumonia   AIDS ([name])   PE (pulmonary thromboembolism) ([name])   Normochromic normocytic anemia   Pneumonia   Protein-calorie malnutrition, severe  Subjective: Chest pain and breathing is getting better. No other complaints now.   Abtx:  Anti-infectives    Start     Dose/Rate Route Frequency Ordered Stop   02/26/16 0700  darunavir-cobicistat (PREZCOBIX) 800-150 MG per tablet 1 tablet     1 tablet Oral Daily with breakfast 02/25/16 1453     02/25/16 2200  cefTRIAXone (ROCEPHIN) 1 g in dextrose 5 % 50 mL IVPB  Status:  Discontinued     1 g 100 mL/hr over 30 Minutes Intravenous Every 24 hours 02/25/16 0255 02/25/16 1453   02/25/16 2200  azithromycin (ZITHROMAX) 500 mg in dextrose 5 % 250 mL IVPB  Status:  Discontinued     500 mg 250 mL/hr over 60 Minutes Intravenous Every 24 hours 02/25/16 0255 02/25/16 1453   02/25/16 1530  zidovudine (RETROVIR) capsule 100 mg     100 mg Oral Every 8 hours 02/25/16 1453     02/25/16 1500  dolutegravir (TIVICAY) tablet 50 mg     50 mg Oral 2 times daily 02/25/16 1453     02/25/16 0400  sulfamethoxazole-trimethoprim (BACTRIM) 320 mg in dextrose 5 % 500 mL IVPB     320 mg 346.7 mL/hr over 90 Minutes Intravenous Every 8 hours 02/25/16 0309     02/24/16 2300  cefTRIAXone (ROCEPHIN) 1 g in dextrose 5 % 50 mL IVPB     1 g 100 mL/hr over 30 Minutes Intravenous  Once 02/24/16 2245 02/25/16 0035   02/24/16 2300  azithromycin (ZITHROMAX) 500 mg in dextrose 5 % 250 mL IVPB     500 mg 250 mL/hr over 60 Minutes Intravenous  Once 02/24/16 2245 02/25/16 2049      Medications: I have reviewed the patient's current medications.  Objective: Vital signs in last 24 hours: Temp:  [97.8 F (36.6 C)-98.3 F (36.8 C)] 98.1 F (36.7 C) (12/13 0545) Pulse Rate:  [90-91] 91 (12/13  0545) Resp:  [16-18] 18 (12/13 0545) BP: (80-106)/(50-67) 106/67 (12/13 0545) SpO2:  [90 %-92 %] 91 % (12/13 0545) Weight:  [121 lb 9.6 oz (55.2 kg)] 121 lb 9.6 oz (55.2 kg) (12/13 0545)  Vitals reviewed. General: resting in bed, NAD, cachectic  HEENT: PERRL, EOMI, no scleral icterus Cardiac: RRR, no rubs, murmurs or gallops Pulm: crackles on both bases.  Abd: soft, nontender, nondistended, BS present Ext: warm and well perfused, no pedal edema Neuro: alert and oriented X3   Lab Results  Recent Labs  02/25/16 0328 02/26/16 0450  WBC 2.4* 3.8*  HGB 10.6* 11.3*  HCT 32.6* 34.2*  NA 136 138  K 3.6 4.7  CL 103 106  CO2 27 27  BUN 9 16  CREATININE 0.86 0.99   Liver Panel  Recent Labs  02/25/16 0328 02/26/16 0450  PROT 7.5 7.4  ALBUMIN 2.0* 1.9*  AST 22 21  ALT 9* 9*  ALKPHOS 485* 461*  BILITOT 0.4 0.4   Sedimentation Rate No results for input(s): ESRSEDRATE in the last 72 hours. C-Reactive Protein No results for input(s): CRP in the last 72 hours.  Microbiology: Recent Results (from the past 240 hour(s))  Culture, blood (Routine x 2)  Status: None (Preliminary result)   Collection Time: 02/24/16  7:20 PM  Result Value Ref Range Status   Specimen Description BLOOD RIGHT ANTECUBITAL  Final   Special Requests IN PEDIATRIC BOTTLE 1CC  Final   Culture NO GROWTH < 24 HOURS  Final   Report Status PENDING  Incomplete  Culture, blood (Routine x 2)     Status: None (Preliminary result)   Collection Time: 02/24/16  7:28 PM  Result Value Ref Range Status   Specimen Description BLOOD LEFT ANTECUBITAL  Final   Special Requests BOTTLES DRAWN AEROBIC AND ANAEROBIC 5CC  Final   Culture NO GROWTH < 24 HOURS  Final   Report Status PENDING  Incomplete  Culture, sputum-assessment     Status: None   Collection Time: 02/25/16  2:54 AM  Result Value Ref Range Status   Specimen Description EXPECTORATED SPUTUM  Final   Special Requests NONE  Final   Sputum evaluation    Final    MICROSCOPIC FINDINGS SUGGEST THAT THIS SPECIMEN IS NOT REPRESENTATIVE OF LOWER RESPIRATORY SECRETIONS. PLEASE RECOLLECIhor Austin. J THOMAS RN [id number] 02/26/16 A BROWNING    Report Status 02/26/2016 FINAL  Final    Studies/Results: Dg Chest 2 View  Result Date: 02/24/2016 CLINICAL DATA:  Cough for 1 month EXAM: CHEST  2 VIEW COMPARISON:  10/03/2013 FINDINGS: Cardiac shadow is within normal limits. Lungs are well aerated bilaterally. Patchy infiltrate is noted in the right lung base and superimposed over chronic changes in the left mid lung. No sizable effusion is seen. No bony abnormality is noted. Diffuse chronic fibrotic changes are noted throughout both lungs. IMPRESSION: Acute on chronic infiltrate within the right base and left mid lung. Electronically Signed   By: Alcide CleverMark  Lukens M.D.   On: 02/24/2016 20:40   Ct Angio Chest Pe W/cm &/or Wo Cm  Result Date: 02/24/2016 CLINICAL DATA:  45 year old male with cough and shortness of breath and chest pain x1 month. EXAM: CT ANGIOGRAPHY CHEST WITH CONTRAST TECHNIQUE: Multidetector CT imaging of the chest was performed using the standard protocol during bolus administration of intravenous contrast. Multiplanar CT image reconstructions and MIPs were obtained to evaluate the vascular anatomy. CONTRAST:  80 cc Isovue 370 COMPARISON:  Chest radiograph dated 02/24/2016 FINDINGS: Cardiovascular: There is no cardiomegaly or pericardial effusion. The thoracic aorta appears unremarkable. The origins of the great vessels of the aortic arch appear patent. The evaluation for pulmonary artery is somewhat limited due to respiratory motion artifact. There is a small filling defect involving the right upper lobe pulmonary artery branch (series 5, image 120). The remainder of the visualized pulmonary arteries appear patent. Mediastinum/Nodes: There is right hilar adenopathy measuring up to 2 cm in short axis. Subcarinal adenopathy as well as aortopulmonic adenopathy noted. The  esophagus is grossly unremarkable. No definite thyroid nodules identified. Lungs/Pleura: There is emphysematous changes of the lungs. There are bronchiectatic changes primarily involving the left lower lobe and left upper lobe. There is subpleural collapse with associated scarring in the apices bilaterally. There is a patchy and confluent ground-glass and nodular density primarily involving the right lower lobe most compatible with pneumonia. Left upper lobe interstitial thickening and ground-glass densities likely represent acute infection superimposed on a background of chronic changes. An area of nodular and ground-glass density also noted in the right middle lobe. Scattered nodularity in the right upper lobe most likely inflammatory/ infectious in etiology. There is no pleural effusion or pneumothorax. The central airways are patent. Upper Abdomen: No acute abnormality. Musculoskeletal:  There is loss of subcutaneous fat and cachexia. The osseous structures are intact. Review of the MIP images confirms the above findings. IMPRESSION: Right upper lobe small segmental pulmonary artery embolus. Multifocal pneumonia on a background of emphysema, chronic lung disease, and bronchiectatic changes. Clinical correlation and follow-up resolution recommended. Right hilar and mediastinal adenopathy, likely reactive. Clinical correlation follow-up recommended. Cachexia. These results were called by telephone at the time of interpretation on 02/24/2016 at 10:37 pm to physician assistant LEAPHART , who verbally acknowledged these results. Electronically Signed   By: Elgie Collard M.D.   On: 02/24/2016 22:40     Assessment/Plan:  Multifocal PNA - PCP pneumonia seems more likely given reticular pattern seen on imaging. Less likely to be CAP - continue to treat for PCP pneumonia with bactrim.  -f/up PCP DFA. Sputum culture was bad quality, needs repeat sputum culture or BAL.  - r/o TB, AFB pending.    HIV AIDS  (highly drug resistant virus)- repeat CD 4 pending. Last CD 4 was 60 03/2014. - restarted ART: darunavir/c, dolutegravir bid, zidovudine q8hr. If he can't tolerate this, may have to reduce the frequency of dolutegravir   Acute PE - RUL small segmental PE - on heparin gtt  Right hilar and mediastinal adenopathy - likely reactive.    Leukopenia - likely from AIDS  Normocytic anemia   Total days of antibiotics: bactrim day 2.          Annet Manukyan Infectious Diseases (pager) [phone] [email or web address] 02/26/2016, 8:57 AM  LOS: 2 days

## 2016-02-26 NOTE — Progress Notes (Signed)
ANTICOAGULATION CONSULT NOTE - Follow-up Consult   Pharmacy Consult for Heparin  Indication: pulmonary embolus  No Known Allergies  Patient Measurements: Height: 5\' 8"  (172.7 cm) Weight: 120 lb 3.2 oz (54.5 kg) (c scale) IBW/kg (Calculated) : 68.4  Vital Signs: Temp: 97.8 F (36.6 C) (12/12 2030) Temp Source: Oral (12/12 2030) BP: 103/59 (12/12 1926) Pulse Rate: 90 (12/12 1926)  Labs:  Recent Labs  02/24/16 1858 02/25/16 0328 02/25/16 0628 02/25/16 0717 02/25/16 1209 02/25/16 1813 02/26/16 0450  HGB 12.1* 10.6*  --   --   --   --   --   HCT 35.5* 32.6*  --   --   --   --   --   PLT 204 182  --   --   --   --   --   HEPARINUNFRC  --   --   --  0.58 0.62  --  0.78*  CREATININE 1.01 0.86  --   --   --   --   --   TROPONINI  --   --  <0.03  --  <0.03 <0.03  --     Estimated Creatinine Clearance: 84.5 mL/min (by C-G formula based on SCr of 0.86 mg/dL).   Assessment: 45 yo male with history of HIV with new onset PE. Pharmacy has been consulted for heparin dosing. Vascular ultrasound shows no signs of DVT. Heparin level now trended up to supratherapeutic (0.78) on gtt at 1500 units/hr. No bleeding noted.  Goal of Therapy:  Heparin level 0.3-0.7 units/ml Monitor platelets by anticoagulation protocol: Yes   Plan:  Decrease heparin to 1400 units/hr  F/u 6 hour heparin level   Christoper Fabianaron Donley Harland, PharmD, BCPS Clinical pharmacist, pager [phone] 02/26/2016,5:43 AM

## 2016-02-26 NOTE — Progress Notes (Signed)
ANTICOAGULATION CONSULT NOTE - Follow-up Consult   Pharmacy Consult for Heparin  Indication: pulmonary embolus  No Known Allergies  Patient Measurements: Height: 5\' 8"  (172.7 cm) Weight: 121 lb 9.6 oz (55.2 kg) (scale c) IBW/kg (Calculated) : 68.4  Heparin Dosing Weight: 55.2 kg  Vital Signs: Temp: 98.1 F (36.7 C) (12/13 1200) Temp Source: Oral (12/13 1200) BP: 103/61 (12/13 1200) Pulse Rate: 90 (12/13 1200)  Labs:  Recent Labs  02/24/16 1858 02/25/16 0328 02/25/16 0628  02/25/16 1209 02/25/16 1813 02/26/16 0450 02/26/16 1202  HGB 12.1* 10.6*  --   --   --   --  11.3*  --   HCT 35.5* 32.6*  --   --   --   --  34.2*  --   PLT 204 182  --   --   --   --  235  --   HEPARINUNFRC  --   --   --   < > 0.62  --  0.78* 0.93*  CREATININE 1.01 0.86  --   --   --   --  0.99  --   TROPONINI  --   --  <0.03  --  <0.03 <0.03  --   --   < > = values in this interval not displayed.  Estimated Creatinine Clearance: 74.3 mL/min (by C-G formula based on SCr of 0.99 mg/dL).   Assessment: 45 yo male with history of HIV with new onset PE. Pharmacy has been consulted for heparin dosing.  Heparin level continues to be supratherapeutic at 0.93 on 1400 units/hr. CBC is stable. No bleeding noted per nurse. Patient will need warfarin when converted to oral therapy due to drug interactions associated with NOACs and Prezcobix.   Goal of Therapy:  Heparin level 0.3-0.7 units/ml Monitor platelets by anticoagulation protocol: Yes   Plan:  Decrease heparin to 1250 units/hr  F/u 6 hour heparin level  Daily heparin level and CBC  Follow-up conversion to warfarin  Bailey MechEmily Stewart, PharmD PGY1 Pharmacy Resident Pager: [phone]  02/26/2016,12:52 PM

## 2016-02-26 NOTE — Progress Notes (Signed)
PROGRESS NOTE  [name]  ZOX:[id number] DOB: 04/24/1970  DOA: 02/24/2016 PCP: Renold Don, MD   Brief Narrative:  45 year old male with a PMH of HIV/AIDS with highly drug-resistant virus, last CD4 count on 03/2014 was 60, depression, hepatitis, pulmonary TB (previously treated according to refugee documentation), presented to [facility] ED on 02/25/16 due to worsening dyspnea of 1 month duration, pleuritic type of chest pain, productive cough without hemoptysis, no fever or chills but has weight loss. CTA chest confirmed pulmonary embolism with multifocal pneumonia, COPD and bronchiectasis. Admitted with airborne isolation, started on IV heparin drip and ID consulted.   Assessment & Plan:   Principal Problem:   Acute respiratory failure with hypoxia ([name]) Active Problems:   Multifocal pneumonia   AIDS ([name])   PE (pulmonary thromboembolism) ([name])   Normochromic normocytic anemia   Pneumonia   Protein-calorie malnutrition, severe   1. Acute pulmonary embolism: RUL small segmental PE. Currently on IV heparin drip. Discussed with infectious disease M.D. & pharmacy >will be difficult to transition to NOAC due to drug interaction with antiretrovirals which cannot be changed due to his highly drug-resistant virus. Hence we'll change to Lovenox bridging and Coumadin. 2. HIV AIDS (highly drug-resistant virus): As per ID follow-up, would be very difficult to change his ART because he has multiple drug-resistant mutations 3. Multifocal pneumonia/suspected PCP) await results of PCP DFA, AFB and sputum culture. Continue Bactrim and prednisone. 4. Acute respiratory failure with hypoxia: Secondary to PCP and PE. Resolved. 5. Leukopenia and anemia: Likely from AIDS +/- medications. Follow CBCs closely.   DVT prophylaxis: Currently on IV heparin infusion  Code Status: Full Family Communication: None at bedside Disposition Plan: DC home when medically stable   Consultants:   Infectious  disease  Procedures:   None  Antimicrobials:   IV azithromycin and ceftriaxone-discontinued  HAART>  PO Bactrim 12/11>   Subjective: Feels better. Denies complaints. Denied chest pain or dyspnea. Mild cough.  Objective:  Vitals:   02/25/16 1926 02/25/16 2030 02/26/16 0545 02/26/16 1200  BP: (!) 103/59  106/67 103/61  Pulse: 90  91 90  Resp:  16 18 18   Temp:  97.8 F (36.6 C) 98.1 F (36.7 C) 98.1 F (36.7 C)  TempSrc:  Oral Oral Oral  SpO2:  90% 91% 94%  Weight:   55.2 kg (121 lb 9.6 oz)   Height:        Intake/Output Summary (Last 24 hours) at 02/26/16 1525 Last data filed at 02/26/16 1452  Gross per 24 hour  Intake           3758.5 ml  Output             2200 ml  Net           1558.5 ml   Filed Weights   02/25/16 0054 02/25/16 0456 02/26/16 0545  Weight: 54.4 kg (119 lb 14.9 oz) 54.5 kg (120 lb 3.2 oz) 55.2 kg (121 lb 9.6 oz)    Examination:  General exam: Pleasant young male, chronically ill-looking, lying comfortably supine in bed. Respiratory system: Few basal crackles but otherwise clear to auscultation. Respiratory effort normal. Cardiovascular system: S1 & S2 heard, RRR. No JVD, murmurs, rubs, gallops or clicks. No pedal edema. telemetry: Sinus rhythm  Gastrointestinal system: Abdomen is nondistended, soft and nontender. No organomegaly or masses felt. Normal bowel sounds heard. Central nervous system: Alert and oriented. No focal neurological deficits. Extremities: Symmetric 5 x 5 power. Skin: No rashes, lesions or ulcers Psychiatry: Judgement  and insight appear normal. Mood & affect appropriate.     Data Reviewed: I have personally reviewed following labs and imaging studies  CBC:  Recent Labs Lab 02/24/16 1858 02/25/16 0328 02/26/16 0450  WBC 3.9* 2.4* 3.8*  NEUTROABS 2.3 1.2* 2.6  HGB 12.1* 10.6* 11.3*  HCT 35.5* 32.6* 34.2*  MCV 97.5 99.1 96.6  PLT 204 182 235   Basic Metabolic Panel:  Recent Labs Lab 02/24/16 1858  02/25/16 0328 02/26/16 0450  NA 135 136 138  K 3.7 3.6 4.7  CL 100* 103 106  CO2 28 27 27   GLUCOSE 138* 95 110*  BUN 11 9 16   CREATININE 1.01 0.86 0.99  CALCIUM 9.0 8.5* 9.3  MG  --   --  1.9   GFR: Estimated Creatinine Clearance: 74.3 mL/min (by C-G formula based on SCr of 0.99 mg/dL). Liver Function Tests:  Recent Labs Lab 02/24/16 1858 02/25/16 0328 02/26/16 0450  AST 27 22 21   ALT 10* 9* 9*  ALKPHOS 597* 485* 461*  BILITOT 0.5 0.4 0.4  PROT 8.8* 7.5 7.4  ALBUMIN 2.3* 2.0* 1.9*   No results for input(s): LIPASE, AMYLASE in the last 168 hours. No results for input(s): AMMONIA in the last 168 hours. Coagulation Profile: No results for input(s): INR, PROTIME in the last 168 hours. Cardiac Enzymes:  Recent Labs Lab 02/25/16 0628 02/25/16 1209 02/25/16 1813  TROPONINI <0.03 <0.03 <0.03   BNP (last 3 results) No results for input(s): PROBNP in the last 8760 hours. HbA1C: No results for input(s): HGBA1C in the last 72 hours. CBG: No results for input(s): GLUCAP in the last 168 hours. Lipid Profile: No results for input(s): CHOL, HDL, LDLCALC, TRIG, CHOLHDL, LDLDIRECT in the last 72 hours. Thyroid Function Tests: No results for input(s): TSH, T4TOTAL, FREET4, T3FREE, THYROIDAB in the last 72 hours. Anemia Panel:  Recent Labs  02/25/16 0328  VITAMINB12 291  FOLATE 11.3  FERRITIN 616*  TIBC 181*  IRON 13*  RETICCTPCT 0.7    Sepsis Labs:  Recent Labs Lab 02/24/16 1945  LATICACIDVEN 1.47    Recent Results (from the past 240 hour(s))  Culture, blood (Routine x 2)     Status: None (Preliminary result)   Collection Time: 02/24/16  7:20 PM  Result Value Ref Range Status   Specimen Description BLOOD RIGHT ANTECUBITAL  Final   Special Requests IN PEDIATRIC BOTTLE 1CC  Final   Culture NO GROWTH < 24 HOURS  Final   Report Status PENDING  Incomplete  Culture, blood (Routine x 2)     Status: None (Preliminary result)   Collection Time: 02/24/16  7:28 PM   Result Value Ref Range Status   Specimen Description BLOOD LEFT ANTECUBITAL  Final   Special Requests BOTTLES DRAWN AEROBIC AND ANAEROBIC 5CC  Final   Culture NO GROWTH < 24 HOURS  Final   Report Status PENDING  Incomplete  Urine culture     Status: Abnormal   Collection Time: 02/25/16 12:39 AM  Result Value Ref Range Status   Specimen Description URINE, RANDOM  Final   Special Requests NONE  Final   Culture MULTIPLE SPECIES PRESENT, SUGGEST RECOLLECTION (A)  Final   Report Status 02/26/2016 FINAL  Final  Culture, sputum-assessment     Status: None   Collection Time: 02/25/16  2:54 AM  Result Value Ref Range Status   Specimen Description EXPECTORATED SPUTUM  Final   Special Requests NONE  Final   Sputum evaluation   Final  MICROSCOPIC FINDINGS SUGGEST THAT THIS SPECIMEN IS NOT REPRESENTATIVE OF LOWER RESPIRATORY SECRETIONS. PLEASE RECOLLECTIhor Austin RN [id number] 02/26/16 A BROWNING    Report Status 02/26/2016 FINAL  Final  Pneumocystis smear by DFA     Status: None   Collection Time: 02/25/16  2:54 PM  Result Value Ref Range Status   Specimen Source-PJSRC SPUTUM  Final   Pneumocystis jiroveci Ag NEGATIVE  Final    Comment: Performed at [facility]         Radiology Studies: Dg Chest 2 View  Result Date: 02/24/2016 CLINICAL DATA:  Cough for 1 month EXAM: CHEST  2 VIEW COMPARISON:  10/03/2013 FINDINGS: Cardiac shadow is within normal limits. Lungs are well aerated bilaterally. Patchy infiltrate is noted in the right lung base and superimposed over chronic changes in the left mid lung. No sizable effusion is seen. No bony abnormality is noted. Diffuse chronic fibrotic changes are noted throughout both lungs. IMPRESSION: Acute on chronic infiltrate within the right base and left mid lung. Electronically Signed   By: Alcide Clever M.D.   On: 02/24/2016 20:40   Ct Angio Chest Pe W/cm &/or Wo Cm  Result Date: 02/24/2016 CLINICAL DATA:  45 year old male with cough  and shortness of breath and chest pain x1 month. EXAM: CT ANGIOGRAPHY CHEST WITH CONTRAST TECHNIQUE: Multidetector CT imaging of the chest was performed using the standard protocol during bolus administration of intravenous contrast. Multiplanar CT image reconstructions and MIPs were obtained to evaluate the vascular anatomy. CONTRAST:  80 cc Isovue 370 COMPARISON:  Chest radiograph dated 02/24/2016 FINDINGS: Cardiovascular: There is no cardiomegaly or pericardial effusion. The thoracic aorta appears unremarkable. The origins of the great vessels of the aortic arch appear patent. The evaluation for pulmonary artery is somewhat limited due to respiratory motion artifact. There is a small filling defect involving the right upper lobe pulmonary artery branch (series 5, image 120). The remainder of the visualized pulmonary arteries appear patent. Mediastinum/Nodes: There is right hilar adenopathy measuring up to 2 cm in short axis. Subcarinal adenopathy as well as aortopulmonic adenopathy noted. The esophagus is grossly unremarkable. No definite thyroid nodules identified. Lungs/Pleura: There is emphysematous changes of the lungs. There are bronchiectatic changes primarily involving the left lower lobe and left upper lobe. There is subpleural collapse with associated scarring in the apices bilaterally. There is a patchy and confluent ground-glass and nodular density primarily involving the right lower lobe most compatible with pneumonia. Left upper lobe interstitial thickening and ground-glass densities likely represent acute infection superimposed on a background of chronic changes. An area of nodular and ground-glass density also noted in the right middle lobe. Scattered nodularity in the right upper lobe most likely inflammatory/ infectious in etiology. There is no pleural effusion or pneumothorax. The central airways are patent. Upper Abdomen: No acute abnormality. Musculoskeletal: There is loss of subcutaneous fat  and cachexia. The osseous structures are intact. Review of the MIP images confirms the above findings. IMPRESSION: Right upper lobe small segmental pulmonary artery embolus. Multifocal pneumonia on a background of emphysema, chronic lung disease, and bronchiectatic changes. Clinical correlation and follow-up resolution recommended. Right hilar and mediastinal adenopathy, likely reactive. Clinical correlation follow-up recommended. Cachexia. These results were called by telephone at the time of interpretation on 02/24/2016 at 10:37 pm to physician assistant LEAPHART , who verbally acknowledged these results. Electronically Signed   By: Elgie Collard M.D.   On: 02/24/2016 22:40        Scheduled  Meds: . darunavir-cobicistat  1 tablet Oral Q breakfast  . dolutegravir  50 mg Oral BID  . feeding supplement (ENSURE ENLIVE)  237 mL Oral TID BM  . Influenza vac split quadrivalent PF  0.5 mL Intramuscular Tomorrow-1000  . multivitamin with minerals  1 tablet Oral Daily  . pneumococcal 23 valent vaccine  0.5 mL Intramuscular Tomorrow-1000  . predniSONE  60 mg Oral Q breakfast  . sulfamethoxazole-trimethoprim  320 mg Intravenous Q8H  . zidovudine  100 mg Oral Q8H   Continuous Infusions: . heparin 1,250 Units/hr (02/26/16 1315)     LOS: 2 days       [facility] Ill[facility] Healthcare System - DanvilleNGALGI,ANAND, MD [facility] Pager [phone]  If 7PM-7AM, please contact night-coverage [email or web address] Password [email or web address] 02/26/2016, 3:25 PM

## 2016-02-26 NOTE — Progress Notes (Signed)
ANTICOAGULATION CONSULT NOTE - Initial Consult  Pharmacy Consult for lovenox/warfarin Indication: pulmonary embolus  No Known Allergies  Patient Measurements: Height: 5' 8" (172.7 cm) Weight: 121 lb 9.6 oz (55.2 kg) (scale c) IBW/kg (Calculated) : 68.4   Vital Signs: Temp: 98.1 F (36.7 C) (12/13 1200) Temp Source: Oral (12/13 1200) BP: 103/61 (12/13 1200) Pulse Rate: 90 (12/13 1200)  Labs:  Recent Labs  02/24/16 1858 02/25/16 0328 02/25/16 0628  02/25/16 1209 02/25/16 1813 02/26/16 0450 02/26/16 1202  HGB 12.1* 10.6*  --   --   --   --  11.3*  --   HCT 35.5* 32.6*  --   --   --   --  34.2*  --   PLT 204 182  --   --   --   --  235  --   HEPARINUNFRC  --   --   --   < > 0.62  --  0.78* 0.93*  CREATININE 1.01 0.86  --   --   --   --  0.99  --   TROPONINI  --   --  <0.03  --  <0.03 <0.03  --   --   < > = values in this interval not displayed.  Estimated Creatinine Clearance: 74.3 mL/min (by C-G formula based on SCr of 0.99 mg/dL).   Medical History: Past Medical History:  Diagnosis Date  . Depression    "stress and depression for any man is common" (03/21/2014)  . Hepatitis    "I don't know what hepatitis I have"  . HIV disease ([place])   . TB (pulmonary tuberculosis)    previously treated according to refugee documentation      Assessment: 45 yo male with history of HIV with new onset PE. Patient was on heparin drip and is now being switched to enoxaparin and started on warfarin. CBC has been stable inpatient and no signs of bleeding have been noted. Of note, patient has low albumin and elevated alk phos. He is also taking Prezcobix and Bactrim which can increase warfarin levels. For this reason I will start him at a lower warfarin dose.   Goal of Therapy:  INR 2-3 Monitor platelets by anticoagulation protocol: Yes   Plan:  Lovenox 1 mg/kg every 12 hours to start 1 hour after heparin drip stops Warfarin 3 mg x 1 tonight Daily INR Monitor for signs/symptoms  of bleeding   Ihor Austin, PharmD PGY1 Pharmacy Resident Pager: [phone]  02/26/2016,4:51 PM

## 2016-02-27 ENCOUNTER — Telehealth: Payer: Self-pay | Admitting: *Deleted

## 2016-02-27 LAB — CBC
HCT: 34.1 % — ABNORMAL LOW (ref 39.0–52.0)
HEMOGLOBIN: 11.6 g/dL — AB (ref 13.0–17.0)
MCH: 32.8 pg (ref 26.0–34.0)
MCHC: 34 g/dL (ref 30.0–36.0)
MCV: 96.3 fL (ref 78.0–100.0)
Platelets: 263 10*3/uL (ref 150–400)
RBC: 3.54 MIL/uL — AB (ref 4.22–5.81)
RDW: 11.6 % (ref 11.5–15.5)
WBC: 7.2 10*3/uL (ref 4.0–10.5)

## 2016-02-27 LAB — PROTIME-INR
INR: 1.2
Prothrombin Time: 15.3 seconds — ABNORMAL HIGH (ref 11.4–15.2)

## 2016-02-27 MED ORDER — SULFAMETHOXAZOLE-TRIMETHOPRIM 800-160 MG PO TABS
2.0000 | ORAL_TABLET | Freq: Three times a day (TID) | ORAL | Status: DC
  Administered 2016-02-27 – 2016-03-04 (×17): 2 via ORAL
  Filled 2016-02-27 (×19): qty 2

## 2016-02-27 MED ORDER — SULFAMETHOXAZOLE-TRIMETHOPRIM 800-160 MG PO TABS
2.0000 | ORAL_TABLET | Freq: Three times a day (TID) | ORAL | Status: DC
  Filled 2016-02-27 (×2): qty 2

## 2016-02-27 MED ORDER — WARFARIN SODIUM 3 MG PO TABS
3.0000 mg | ORAL_TABLET | Freq: Once | ORAL | Status: AC
  Administered 2016-02-27: 3 mg via ORAL
  Filled 2016-02-27: qty 1

## 2016-02-27 NOTE — Telephone Encounter (Signed)
Steward DroneBrenda case manager on [address] wants to talk to dr Gwendolyn Grantwalden about pt. Please give her a call [phone]. Shontel Santee Bruna PotterBlount, CMA

## 2016-02-27 NOTE — Progress Notes (Signed)
[name] for lovenox/warfarin Indication: pulmonary embolus  No Known Allergies  Patient Measurements: Height: 5' 8" (172.7 cm) Weight: 121 lb 14.4 oz (55.3 kg) (scale c) IBW/kg (Calculated) : 68.4   Vital Signs: Temp: 98.4 F (36.9 C) (12/14 0611) Temp Source: Oral (12/14 0611) BP: 101/57 (12/14 0611) Pulse Rate: 76 (12/14 0611)  Labs:  Recent Labs  02/24/16 1858 02/25/16 0328 02/25/16 0628  02/25/16 1209 02/25/16 1813 02/26/16 0450 02/26/16 1202 02/27/16 0254  HGB 12.1* 10.6*  --   --   --   --  11.3*  --  11.6*  HCT 35.5* 32.6*  --   --   --   --  34.2*  --  34.1*  PLT 204 182  --   --   --   --  235  --  263  LABPROT  --   --   --   --   --   --   --   --  15.3*  INR  --   --   --   --   --   --   --   --  1.20  HEPARINUNFRC  --   --   --   < > 0.62  --  0.78* 0.93*  --   CREATININE 1.01 0.86  --   --   --   --  0.99  --   --   TROPONINI  --   --  <0.03  --  <0.03 <0.03  --   --   --   < > = values in this interval not displayed.  Estimated Creatinine Clearance: 74.5 mL/min (by C-G formula based on SCr of 0.99 mg/dL).   Medical History: Past Medical History:  Diagnosis Date  . Depression    "stress and depression for any man is common" (03/21/2014)  . Hepatitis    "I don't know what hepatitis I have"  . HIV disease ([place])   . TB (pulmonary tuberculosis)    previously treated according to refugee documentation      Assessment: 45 yo male with history of HIV with new onset PE. Patient was on heparin drip and has been transitioned to warfarin with enoxaparin bridge.   As expected, INR is subtherapeutic today at 1.20 after first dose of warfarin yesterday. Hemoglobin and platelet count have been stable inpatient and no signs of bleeding have been noted. Of note, patient has low albumin and elevated alk phos. He is also taking Prezcobix and Bactrim which can increase warfarin levels. Steroids can have variable effects on  INR. Renal function remains stable.   Will continue with another dose of 80m tonight and enoxaparin bridge.   Goal of Therapy:  INR 2-3 Monitor platelets by anticoagulation protocol: Yes   Plan:  Enoxaparin 572m(59m68mg) [place] every 12 hours as bridge for at least five days and until INR is therapeutic x 2 readings  Warfarin 3 mg again x 1 tonight Daily INR Monitor for signs/symptoms of bleeding Patient will need close INR following upon discharge due to multiple drug interations  MagDemetrius CharityharmD Acute Care Pharmacy Resident  Pager: [phone] 3038/14/2017

## 2016-02-27 NOTE — Telephone Encounter (Signed)
I tried to return the call to the case manager but had to leave a voicemail. I left a message saying she can call me back either with my pager number or at the office tomorrow.

## 2016-02-27 NOTE — Progress Notes (Addendum)
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 CTA chest confirmed pulmonary embolism with multifocal pneumonia, COPD and bronchiectasis. Admitted with airborne isolation, started on IV heparin drip and ID consulted. Improving. On 02/27/16, case manager advised that patient's PCP is with the [facility] teaching service. I discussed with Dr. Chanetta Marshall and Dr. McDiarmid [facility] FP service)who have kindly agreed to assume primary care responsibilities beginning 02/28/16 and TRH will sign off at that time.   Assessment & Plan:   Principal Problem:   Acute respiratory failure with hypoxia ([name]) Active Problems:   Multifocal pneumonia   AIDS ([name])   PE (pulmonary thromboembolism) ([name])   Normochromic normocytic anemia   Pneumonia   Protein-calorie malnutrition, severe   1. Acute pulmonary embolism: RUL small segmental PE. Was initially started on IV heparin drip. Discussed with infectious disease M.D. & pharmacy >will be difficult to transition to NOAC due to drug interaction with antiretrovirals which cannot be changed due to his highly drug-resistant virus. Hence started Lovenox bridging and Coumadin. Patient to follow-up with family medicine clinic for INR checks post discharge. 2. HIV AIDS (highly drug-resistant virus): As per ID follow-up, would be very difficult to change his ART because he has multiple drug-resistant mutations. Repeat CD4: 20 and HIV viral load log 54. 3. Multifocal pneumonia/suspected PCP: Reticular pattern  seen on imaging, PCP DFA negative but high index of suspicion for PCP due to CD4 being 20 and clinical picture. Inducing sputum per RT. ID recommending BAL-Discussed with PCCM/Elink and placed consult for 12/15. AFB smear negative, culture pending.  Continue Bactrim-changed to by mouth and prednisone. 4. Acute respiratory failure with hypoxia: Secondary to PCP and PE. Resolved. 5. Leukopenia and anemia: Likely from AIDS +/- medications. Follow CBCs closely. 6. Right hilar and mediastinal adenopathy: Likely reactive.   DVT prophylaxis: IV heparin and switched to Lovenox bridging and Coumadin on 12/13. Code Status: Full Family Communication: None at bedside. I updated patient in detail regarding his ongoing care and answered his questions via the telephone interpreter on 12/14. Disposition Plan: DC home when medically stable   Consultants:   Infectious disease  Procedures:   None  Antimicrobials:   IV azithromycin and ceftriaxone-discontinued  HAART>  PO Bactrim 12/11>   Subjective: Patient was interviewed using a telephone interpreter. He denied complaints. He specifically denied cough, chest pain or dyspnea. He wanted to know when he would be going home.  Objective:  Vitals:   02/26/16 0545 02/26/16 1200 02/26/16 2116 02/27/16 0611  BP: 106/67 103/61 102/61 (!) 101/57  Pulse: 91 90 86 76  Resp: 18 18 18 16   Temp: 98.1 F (36.7 C) 98.1 F (36.7 C) 98.1 F (36.7 C) 98.4 F (36.9 C)  TempSrc: Oral Oral Oral Oral  SpO2: 91% 94% 93% 93%  Weight: 55.2 kg (121 lb 9.6 oz)   55.3 kg (121 lb 14.4 oz)  Height:        Intake/Output Summary (Last 24 hours) at 02/27/16 0715 Last data filed at 02/27/16 0611  Gross per 24 hour  Intake  1508.44 ml  Output             2850 ml  Net         -1341.56 ml   Filed Weights   02/25/16 0456 02/26/16 0545 02/27/16 0611  Weight: 54.5 kg (120 lb 3.2 oz) 55.2 kg (121 lb 9.6 oz) 55.3 kg (121 lb 14.4 oz)     Examination:  General exam: Pleasant young male, chronically ill-looking, lying comfortably supine in bed. Respiratory system: Few basal crackles but otherwise clear to auscultation. Respiratory effort normal. Cardiovascular system: S1 & S2 heard, RRR. No JVD, murmurs, rubs, gallops or clicks. No pedal edema. telemetry: Sinus rhythm  Gastrointestinal system: Abdomen is nondistended, soft and nontender. No organomegaly or masses felt. Normal bowel sounds heard. Central nervous system: Alert and oriented. No focal neurological deficits. Extremities: Symmetric 5 x 5 power. Skin: No rashes, lesions or ulcers Psychiatry: Judgement and insight appear normal. Mood & affect appropriate.     Data Reviewed: I have personally reviewed following labs and imaging studies  CBC:  Recent Labs Lab 02/24/16 1858 02/25/16 0328 02/26/16 0450 02/27/16 0254  WBC 3.9* 2.4* 3.8* 7.2  NEUTROABS 2.3 1.2* 2.6  --   HGB 12.1* 10.6* 11.3* 11.6*  HCT 35.5* 32.6* 34.2* 34.1*  MCV 97.5 99.1 96.6 96.3  PLT 204 182 235 263   Basic Metabolic Panel:  Recent Labs Lab 02/24/16 1858 02/25/16 0328 02/26/16 0450  NA 135 136 138  K 3.7 3.6 4.7  CL 100* 103 106  CO2 28 27 27   GLUCOSE 138* 95 110*  BUN 11 9 16   CREATININE 1.01 0.86 0.99  CALCIUM 9.0 8.5* 9.3  MG  --   --  1.9   GFR: Estimated Creatinine Clearance: 74.5 mL/min (by C-G formula based on SCr of 0.99 mg/dL). Liver Function Tests:  Recent Labs Lab 02/24/16 1858 02/25/16 0328 02/26/16 0450  AST 27 22 21   ALT 10* 9* 9*  ALKPHOS 597* 485* 461*  BILITOT 0.5 0.4 0.4  PROT 8.8* 7.5 7.4  ALBUMIN 2.3* 2.0* 1.9*   No results for input(s): LIPASE, AMYLASE in the last 168 hours. No results for input(s): AMMONIA in the last 168 hours. Coagulation Profile:  Recent Labs Lab 02/27/16 0254  INR 1.20   Cardiac Enzymes:  Recent Labs Lab 02/25/16 0628 02/25/16 1209 02/25/16 1813  TROPONINI <0.03 <0.03 <0.03   BNP (last 3  results) No results for input(s): PROBNP in the last 8760 hours. HbA1C: No results for input(s): HGBA1C in the last 72 hours. CBG: No results for input(s): GLUCAP in the last 168 hours. Lipid Profile: No results for input(s): CHOL, HDL, LDLCALC, TRIG, CHOLHDL, LDLDIRECT in the last 72 hours. Thyroid Function Tests: No results for input(s): TSH, T4TOTAL, FREET4, T3FREE, THYROIDAB in the last 72 hours. Anemia Panel:  Recent Labs  02/25/16 0328  VITAMINB12 291  FOLATE 11.3  FERRITIN 616*  TIBC 181*  IRON 13*  RETICCTPCT 0.7    Sepsis Labs:  Recent Labs Lab 02/24/16 1945  LATICACIDVEN 1.47    Recent Results (from the past 240 hour(s))  Culture, blood (Routine x 2)     Status: None (Preliminary result)   Collection Time: 02/24/16  7:20 PM  Result Value Ref Range Status   Specimen Description BLOOD RIGHT ANTECUBITAL  Final   Special Requests IN PEDIATRIC BOTTLE 1CC  Final   Culture NO GROWTH 2 DAYS  Final   Report Status PENDING  Incomplete  Culture, blood (Routine x 2)  Status: None (Preliminary result)   Collection Time: 02/24/16  7:28 PM  Result Value Ref Range Status   Specimen Description BLOOD LEFT ANTECUBITAL  Final   Special Requests BOTTLES DRAWN AEROBIC AND ANAEROBIC 5CC  Final   Culture NO GROWTH 2 DAYS  Final   Report Status PENDING  Incomplete  Urine culture     Status: Abnormal   Collection Time: 02/25/16 12:39 AM  Result Value Ref Range Status   Specimen Description URINE, RANDOM  Final   Special Requests NONE  Final   Culture MULTIPLE SPECIES PRESENT, SUGGEST RECOLLECTION (A)  Final   Report Status 02/26/2016 FINAL  Final  Culture, sputum-assessment     Status: None   Collection Time: 02/25/16  2:54 AM  Result Value Ref Range Status   Specimen Description EXPECTORATED SPUTUM  Final   Special Requests NONE  Final   Sputum evaluation   Final    MICROSCOPIC FINDINGS SUGGEST THAT THIS SPECIMEN IS NOT REPRESENTATIVE OF LOWER RESPIRATORY SECRETIONS.  PLEASE RECOLLECIhor Austin. J THOMAS RN [id number] 02/26/16 A BROWNING    Report Status 02/26/2016 FINAL  Final  Pneumocystis smear by DFA     Status: None   Collection Time: 02/25/16  2:54 PM  Result Value Ref Range Status   Specimen Source-PJSRC SPUTUM  Final   Pneumocystis jiroveci Ag NEGATIVE  Final    Comment: [facility]  Acid Fast Smear (AFB)     Status: None   Collection Time: 02/26/16  1:21 AM  Result Value Ref Range Status   AFB Specimen Processing Concentration  Final   Acid Fast Smear Negative  Final    Comment: (NOTE) Performed At: [facility] [address] [place], [place] [id number] [name] JX:[id number]    Source (AFB) SPUTUM  Final         Radiology Studies: No results found.      Scheduled Meds: . darunavir-cobicistat  1 tablet Oral Q breakfast  . dolutegravir  50 mg Oral BID  . enoxaparin (LOVENOX) injection  1 mg/kg Subcutaneous Q12H  . feeding supplement (ENSURE ENLIVE)  237 mL Oral TID BM  . Influenza vac split quadrivalent PF  0.5 mL Intramuscular Tomorrow-1000  . multivitamin with minerals  1 tablet Oral Daily  . pneumococcal 23 valent vaccine  0.5 mL Intramuscular Tomorrow-1000  . predniSONE  60 mg Oral Q breakfast  . sulfamethoxazole-trimethoprim  320 mg Intravenous Q8H  . Warfarin - Pharmacist Dosing Inpatient   Does not apply q1800  . zidovudine  100 mg Oral Q8H   Continuous Infusions:    LOS: 3 days       [facility] LPNGALGI,Eily Louvier, MD [facility] Pager [phone]  If 7PM-7AM, please contact night-coverage [email or web address] Password [email or web address] 02/27/2016, 7:15 AM

## 2016-02-27 NOTE — Progress Notes (Signed)
INFECTIOUS DISEASE PROGRESS NOTE  ID: [name] is Anthony 45 y.o. male with  Principal Problem:   Acute respiratory failure with hypoxia ([name]) Active Problems:   Multifocal pneumonia   AIDS ([name])   PE (pulmonary thromboembolism) ([name])   Normochromic normocytic anemia   Pneumonia   Protein-calorie malnutrition, severe  Subjective: Chest pain and breathing continues to improve. No other complaints now.   Abtx:  Anti-infectives    Start     Dose/Rate Route Frequency Ordered Stop   02/26/16 0700  darunavir-cobicistat (PREZCOBIX) 800-150 MG per tablet 1 tablet     1 tablet Oral Daily with breakfast 02/25/16 1453     02/25/16 2200  cefTRIAXone (ROCEPHIN) 1 g in dextrose 5 % 50 mL IVPB  Status:  Discontinued     1 g 100 mL/hr over 30 Minutes Intravenous Every 24 hours 02/25/16 0255 02/25/16 1453   02/25/16 2200  azithromycin (ZITHROMAX) 500 mg in dextrose 5 % 250 mL IVPB  Status:  Discontinued     500 mg 250 mL/hr over 60 Minutes Intravenous Every 24 hours 02/25/16 0255 02/25/16 1453   02/25/16 1530  zidovudine (RETROVIR) capsule 100 mg     100 mg Oral Every 8 hours 02/25/16 1453     02/25/16 1500  dolutegravir (TIVICAY) tablet 50 mg     50 mg Oral 2 times daily 02/25/16 1453     02/25/16 0400  sulfamethoxazole-trimethoprim (BACTRIM) 320 mg in dextrose 5 % 500 mL IVPB     320 mg 346.7 mL/hr over 90 Minutes Intravenous Every 8 hours 02/25/16 0309     02/24/16 2300  cefTRIAXone (ROCEPHIN) 1 g in dextrose 5 % 50 mL IVPB     1 g 100 mL/hr over 30 Minutes Intravenous  Once 02/24/16 2245 02/25/16 0035   02/24/16 2300  azithromycin (ZITHROMAX) 500 mg in dextrose 5 % 250 mL IVPB     500 mg 250 mL/hr over 60 Minutes Intravenous  Once 02/24/16 2245 02/25/16 2049      Medications: I have reviewed the patient's current medications.  Objective: Vital signs in last 24 hours: Temp:  [98.1 Parsons (36.7 C)-98.4 Parsons (36.9 C)] 98.4 Parsons (36.9 C) (12/14 0611) Pulse Rate:  [76-90] 76 (12/14  0611) Resp:  [16-18] 16 (12/14 0611) BP: (101-103)/(57-61) 101/57 (12/14 0611) SpO2:  [93 %-94 %] 93 % (12/14 0611) Weight:  [121 lb 14.4 oz (55.3 kg)] 121 lb 14.4 oz (55.3 kg) (12/14 [id number])  Vitals reviewed. General: resting in bed, NAD, cachectic  HEENT: PERRL, EOMI, no scleral icterus Cardiac: RRR, no rubs, murmurs or gallops Pulm: crackles on both bases.  Abd: soft, nontender, nondistended, BS present Ext: warm and well perfused, no pedal edema Neuro: alert and oriented X3   Lab Results  Recent Labs  02/25/16 0328 02/26/16 0450 02/27/16 0254  WBC 2.4* 3.8* 7.2  HGB 10.6* 11.3* 11.6*  HCT 32.6* 34.2* 34.1*  NA 136 138  --   K 3.6 4.7  --   CL 103 106  --   CO2 27 27  --   BUN 9 16  --   CREATININE 0.86 0.99  --    Liver Panel  Recent Labs  02/25/16 0328 02/26/16 0450  PROT 7.5 7.4  ALBUMIN 2.0* 1.9*  AST 22 21  ALT 9* 9*  ALKPHOS 485* 461*  BILITOT 0.4 0.4   Sedimentation Rate No results for input(s): ESRSEDRATE in the last 72 hours. C-Reactive Protein No results for input(s): CRP in  the last 72 hours.  Microbiology: Recent Results (from the past 240 hour(s))  Culture, blood (Routine x 2)     Status: None (Preliminary result)   Collection Time: 02/24/16  7:20 PM  Result Value Ref Range Status   Specimen Description BLOOD RIGHT ANTECUBITAL  Final   Special Requests IN PEDIATRIC BOTTLE 1CC  Final   Culture NO GROWTH 2 DAYS  Final   Report Status PENDING  Incomplete  Culture, blood (Routine x 2)     Status: None (Preliminary result)   Collection Time: 02/24/16  7:28 PM  Result Value Ref Range Status   Specimen Description BLOOD LEFT ANTECUBITAL  Final   Special Requests BOTTLES DRAWN AEROBIC AND ANAEROBIC 5CC  Final   Culture NO GROWTH 2 DAYS  Final   Report Status PENDING  Incomplete  Urine culture     Status: Abnormal   Collection Time: 02/25/16 12:39 AM  Result Value Ref Range Status   Specimen Description URINE, RANDOM  Final   Special  Requests NONE  Final   Culture MULTIPLE SPECIES PRESENT, SUGGEST RECOLLECTION (Anthony)  Final   Report Status 02/26/2016 FINAL  Final  Culture, sputum-assessment     Status: None   Collection Time: 02/25/16  2:54 AM  Result Value Ref Range Status   Specimen Description EXPECTORATED SPUTUM  Final   Special Requests NONE  Final   Sputum evaluation   Final    MICROSCOPIC FINDINGS SUGGEST THAT THIS SPECIMEN IS NOT REPRESENTATIVE OF LOWER RESPIRATORY SECRETIONS. PLEASE RECOLLECIhor Parsons. Anthony THOMAS RN [id number] 02/26/16 [name]    Report Status 02/26/2016 FINAL  Final  Pneumocystis smear by DFA     Status: None   Collection Time: 02/25/16  2:54 PM  Result Value Ref Range Status   Specimen Source-PJSRC SPUTUM  Final   Pneumocystis jiroveci Ag NEGATIVE  Final    Comment: [facility]  Acid Fast Smear (AFB)     Status: None   Collection Time: 02/26/16  1:21 AM  Result Value Ref Range Status   AFB Specimen Processing Concentration  Final   Acid Fast Smear Negative  Final    Comment: (NOTE) Performed At: [facility] [address] [place], [place] [id number] Anthony HomerHancock William F MD QI:[id number]:[phone]    Source (AFB) SPUTUM  Final    Studies/Results: No results found.   Assessment/Plan:  Multifocal PNA - reticular pattern seen on imaging suggestive of PCP. However, PCP DFA negative.  - strongly recommend BAL as suspicion still high for PCP with his CD4 being 20 and his overall picture.  - will ask respiratory to induce - will change to po bactrim - r/o TB, AFB smear negative, culture pending.   HIV AIDS (highly drug resistant virus)- repeat CD 4 20. HIV viral load log 54.  - restarted ART: darunavir/c, dolutegravir bid, zidovudine q8hr. If he can't tolerate this, may have to reduce the frequency of dolutegravir. Has highly resistant virus so it will be hard to change his regimen.  Acute PE - RUL small segmental PE - on heparin gtt. May be difficult to transition to NOAC  due to drug interaction.   Right hilar and mediastinal adenopathy - likely reactive.   Leukopenia - resolved.   Normocytic anemia   Total days of antibiotics: bactrim day 3.         Ahmed, Tasrif Infectious Diseases (pager) [phone] [email or web address] 02/27/2016, 9:21 AM  LOS: 3 days

## 2016-02-28 ENCOUNTER — Encounter (HOSPITAL_COMMUNITY): Payer: Self-pay | Admitting: Pulmonary Disease

## 2016-02-28 DIAGNOSIS — E43 Unspecified severe protein-calorie malnutrition: Secondary | ICD-10-CM

## 2016-02-28 LAB — PROTIME-INR
INR: 1.18
PROTHROMBIN TIME: 15.1 s (ref 11.4–15.2)

## 2016-02-28 LAB — CBC
HEMATOCRIT: 38 % — AB (ref 39.0–52.0)
HEMOGLOBIN: 12.8 g/dL — AB (ref 13.0–17.0)
MCH: 32.7 pg (ref 26.0–34.0)
MCHC: 33.7 g/dL (ref 30.0–36.0)
MCV: 97.2 fL (ref 78.0–100.0)
Platelets: 277 10*3/uL (ref 150–400)
RBC: 3.91 MIL/uL — AB (ref 4.22–5.81)
RDW: 12 % (ref 11.5–15.5)
WBC: 6.1 10*3/uL (ref 4.0–10.5)

## 2016-02-28 LAB — RESPIRATORY PANEL BY PCR
ADENOVIRUS-RVPPCR: NOT DETECTED
BORDETELLA PERTUSSIS-RVPCR: NOT DETECTED
CHLAMYDOPHILA PNEUMONIAE-RVPPCR: NOT DETECTED
CORONAVIRUS NL63-RVPPCR: NOT DETECTED
Coronavirus 229E: NOT DETECTED
Coronavirus HKU1: NOT DETECTED
Coronavirus OC43: NOT DETECTED
INFLUENZA A-RVPPCR: NOT DETECTED
Influenza B: NOT DETECTED
Metapneumovirus: NOT DETECTED
Mycoplasma pneumoniae: NOT DETECTED
PARAINFLUENZA VIRUS 3-RVPPCR: NOT DETECTED
PARAINFLUENZA VIRUS 4-RVPPCR: NOT DETECTED
Parainfluenza Virus 1: NOT DETECTED
Parainfluenza Virus 2: NOT DETECTED
RHINOVIRUS / ENTEROVIRUS - RVPPCR: NOT DETECTED
Respiratory Syncytial Virus: NOT DETECTED

## 2016-02-28 LAB — CRYPTOCOCCAL ANTIGEN: Crypto Ag: NEGATIVE

## 2016-02-28 MED ORDER — SODIUM CHLORIDE 0.9 % IV SOLN: Freq: Once | INTRAVENOUS | Status: DC

## 2016-02-28 MED ORDER — PREDNISONE 50 MG PO TABS
60.0000 mg | ORAL_TABLET | Freq: Every day | ORAL | Status: DC
  Administered 2016-02-29: 60 mg via ORAL
  Filled 2016-02-28: qty 1

## 2016-02-28 MED ORDER — POLYETHYLENE GLYCOL 3350 17 G PO PACK
17.0000 g | PACK | Freq: Every day | ORAL | Status: DC
  Administered 2016-02-28 – 2016-03-04 (×5): 17 g via ORAL
  Filled 2016-02-28 (×5): qty 1

## 2016-02-28 MED ORDER — SODIUM CHLORIDE 3 % IN NEBU
4.0000 mL | INHALATION_SOLUTION | Freq: Once | RESPIRATORY_TRACT | Status: AC | PRN
  Administered 2016-02-28: 4 mL via RESPIRATORY_TRACT
  Filled 2016-02-28: qty 4

## 2016-02-28 MED ORDER — WARFARIN SODIUM 5 MG PO TABS
5.0000 mg | ORAL_TABLET | Freq: Once | ORAL | Status: AC
  Administered 2016-02-28: 5 mg via ORAL
  Filled 2016-02-28: qty 1

## 2016-02-28 MED ORDER — DARUNAVIR-COBICISTAT 800-150 MG PO TABS
1.0000 | ORAL_TABLET | Freq: Every day | ORAL | Status: DC
  Administered 2016-02-29 – 2016-03-04 (×5): 1 via ORAL
  Filled 2016-02-28 (×6): qty 1

## 2016-02-28 NOTE — Progress Notes (Signed)
INFECTIOUS DISEASE PROGRESS NOTE  ID: [name] is a 45 y.o. male with  Principal Problem:   Acute respiratory failure with hypoxia ([name]) Active Problems:   Multifocal pneumonia   AIDS ([name])   PE (pulmonary thromboembolism) ([name])   Normochromic normocytic anemia   Pneumonia   Protein-calorie malnutrition, severe  Subjective: Feels fine, no complaints. Was not able to produce a sputum sample.   Abtx:  Anti-infectives    Start     Dose/Rate Route Frequency Ordered Stop   02/27/16 2200  sulfamethoxazole-trimethoprim (BACTRIM DS,SEPTRA DS) 800-160 MG per tablet 2 tablet     2 tablet Oral Every 8 hours 02/27/16 1655     02/27/16 1445  sulfamethoxazole-trimethoprim (BACTRIM DS,SEPTRA DS) 800-160 MG per tablet 2 tablet  Status:  Discontinued     2 tablet Oral Every 8 hours 02/27/16 1431 02/27/16 1655   02/26/16 0700  darunavir-cobicistat (PREZCOBIX) 800-150 MG per tablet 1 tablet     1 tablet Oral Daily with breakfast 02/25/16 1453     02/25/16 2200  cefTRIAXone (ROCEPHIN) 1 g in dextrose 5 % 50 mL IVPB  Status:  Discontinued     1 g 100 mL/hr over 30 Minutes Intravenous Every 24 hours 02/25/16 0255 02/25/16 1453   02/25/16 2200  azithromycin (ZITHROMAX) 500 mg in dextrose 5 % 250 mL IVPB  Status:  Discontinued     500 mg 250 mL/hr over 60 Minutes Intravenous Every 24 hours 02/25/16 0255 02/25/16 1453   02/25/16 1530  zidovudine (RETROVIR) capsule 100 mg     100 mg Oral Every 8 hours 02/25/16 1453     02/25/16 1500  dolutegravir (TIVICAY) tablet 50 mg     50 mg Oral 2 times daily 02/25/16 1453     02/25/16 0400  sulfamethoxazole-trimethoprim (BACTRIM) 320 mg in dextrose 5 % 500 mL IVPB  Status:  Discontinued     320 mg 346.7 mL/hr over 90 Minutes Intravenous Every 8 hours 02/25/16 0309 02/27/16 1431   02/24/16 2300  cefTRIAXone (ROCEPHIN) 1 g in dextrose 5 % 50 mL IVPB     1 g 100 mL/hr over 30 Minutes Intravenous  Once 02/24/16 2245 02/25/16 0035   02/24/16 2300   azithromycin (ZITHROMAX) 500 mg in dextrose 5 % 250 mL IVPB     500 mg 250 mL/hr over 60 Minutes Intravenous  Once 02/24/16 2245 02/25/16 2049      Medications: I have reviewed the patient's current medications.  Objective: Vital signs in last 24 hours: Temp:  [97.8 F (36.6 C)-98.1 F (36.7 C)] 98.1 F (36.7 C) (12/15 0635) Pulse Rate:  [75-97] 75 (12/15 0635) Resp:  [16] 16 (12/15 0635) BP: (94-99)/(58-61) 98/61 (12/15 0635) SpO2:  [92 %-99 %] 99 % (12/15 0635) Weight:  [119 lb 4.8 oz (54.1 kg)] 119 lb 4.8 oz (54.1 kg) (12/15 1610)  Vitals reviewed. General: resting in bed, NAD, cachectic  HEENT: PERRL, EOMI, no scleral icterus Cardiac: RRR, no rubs, murmurs or gallops Pulm: ctab. Abd: soft, nontender, nondistended, BS present Ext: warm and well perfused, no pedal edema Neuro: alert and oriented X3   Lab Results  Recent Labs  02/26/16 0450 02/27/16 0254 02/28/16 0425  WBC 3.8* 7.2 6.1  HGB 11.3* 11.6* 12.8*  HCT 34.2* 34.1* 38.0*  NA 138  --   --   K 4.7  --   --   CL 106  --   --   CO2 27  --   --  BUN 16  --   --   CREATININE 0.99  --   --    Liver Panel  Recent Labs  02/26/16 0450  PROT 7.4  ALBUMIN 1.9*  AST 21  ALT 9*  ALKPHOS 461*  BILITOT 0.4   Sedimentation Rate No results for input(s): ESRSEDRATE in the last 72 hours. C-Reactive Protein No results for input(s): CRP in the last 72 hours.  Microbiology: Recent Results (from the past 240 hour(s))  Culture, blood (Routine x 2)     Status: None (Preliminary result)   Collection Time: 02/24/16  7:20 PM  Result Value Ref Range Status   Specimen Description BLOOD RIGHT ANTECUBITAL  Final   Special Requests IN PEDIATRIC BOTTLE 1CC  Final   Culture NO GROWTH 3 DAYS  Final   Report Status PENDING  Incomplete  Culture, blood (Routine x 2)     Status: None (Preliminary result)   Collection Time: 02/24/16  7:28 PM  Result Value Ref Range Status   Specimen Description BLOOD LEFT ANTECUBITAL   Final   Special Requests BOTTLES DRAWN AEROBIC AND ANAEROBIC 5CC  Final   Culture NO GROWTH 3 DAYS  Final   Report Status PENDING  Incomplete  Urine culture     Status: Abnormal   Collection Time: 02/25/16 12:39 AM  Result Value Ref Range Status   Specimen Description URINE, RANDOM  Final   Special Requests NONE  Final   Culture MULTIPLE SPECIES PRESENT, SUGGEST RECOLLECTION (A)  Final   Report Status 02/26/2016 FINAL  Final  Culture, sputum-assessment     Status: None   Collection Time: 02/25/16  2:54 AM  Result Value Ref Range Status   Specimen Description EXPECTORATED SPUTUM  Final   Special Requests NONE  Final   Sputum evaluation   Final    MICROSCOPIC FINDINGS SUGGEST THAT THIS SPECIMEN IS NOT REPRESENTATIVE OF LOWER RESPIRATORY SECRETIONS. PLEASE RECOLLECIhor Austin. J THOMAS RN [id number] 02/26/16 A BROWNING    Report Status 02/26/2016 FINAL  Final  Pneumocystis smear by DFA     Status: None   Collection Time: 02/25/16  2:54 PM  Result Value Ref Range Status   Specimen Source-PJSRC SPUTUM  Final   Pneumocystis jiroveci Ag NEGATIVE  Final    Comment: [facility]formed [facility]  Acid Fast Smear (AFB)     Status: None   Collection Time: 02/26/16  1:21 AM  Result Value Ref Range Status   AFB Specimen Processing Concentration  Final   Acid Fast Smear Negative  Final    Comment: (NOTE) Performed At: [facility] [address] [place], [place] [id number] [name] JX:[phone]    Source (AFB) SPUTUM  Final    Studies/Results: No results found.   HIV 1 RNA Quant (copies/mL)  Date Value  02/25/2016 54,000  04/19/2014 69 (H)  03/22/2014 4,634 (H)   CD4 T Cell Abs (/uL)  Date Value  02/25/2016 20 (L)  03/22/2014 60 (L)  10/26/2013 68 (L)     Assessment/Plan:  Multifocal PNA - reticular pattern seen on imaging suggestive of PCP. However, PCP DFA negative.  -BAL would be helpful but since suspicion or PCP is very high with his CD4 being 20 and  his overall picture we should just treat him for PCP with 3 weeks of bactrim. On PO bactrim now. Can be discharged on this.  He is getting BAL on 12-18 - r/o TB, AFB smear negative x1, second AFB would be helpful  but he has not been able to produce a second sample. Will ask RT to induce if possible. AFB culture pending.  HIV AIDS (highly drug resistant virus)- repeat CD 4 20. HIV viral load log 54.  - restarted ART: darunavir/c, dolutegravir bid, zidovudine q8hr. If he can't tolerate this, may have to reduce the frequency of dolutegravir. Has highly resistant virus so it will be hard to change his regimen. - needs close f/up with ID clinic.   Acute PE - RUL small segmental PE - on heparin gtt. May be difficult to transition to NOAC due to drug interaction.   Right hilar and mediastinal adenopathy - likely reactive.   Leukopenia - resolved.   Normocytic anemia   Total days of antibiotics: bactrim day 4.         Ahmed, Tasrif Infectious Diseases (pager) [phone]) [phone] [email or web address] 02/28/2016, 9:01 AM  LOS: 4 days

## 2016-02-28 NOTE — Clinical Social Work Note (Signed)
CSW acknowledges consult "Also may need help affording medications." RNCM notified.  CSW signing off. Consult again if any social work needs arise.  Charlynn CourtSarah Olney Monier, CSW [phone]

## 2016-02-28 NOTE — Progress Notes (Signed)
[facility] for health insurance with prescription drug coverage; Alexis GoodellB Brandee Markin RN,MHA,BSN [phone]

## 2016-02-28 NOTE — Consult Note (Signed)
Name: [name] MRN: [id number] DOB: 08/02/1970    ADMISSION DATE:  02/24/2016 CONSULTATION DATE:  02/28/2016  REFERRING MD :  Marcellus ScottAnand Hongalgi, M.D. / [facility]  CHIEF COMPLAINT:  Multifocal Pneumonia  BRIEF PATIENT DESCRIPTION: 45 y.o. male with known HIV infection and AIDS presenting with multifocal pneumonia and acute hypoxic respiratory failure. Records indicate a history of tuberculosis. Patient found to have a pulmonary embolism on CT angiogram of the chest but no hemoptysis.  SIGNIFICANT EVENTS  12/11 - Admit  STUDIES:  CTA CHEST 02/24/16:  Personally reviewed by me. Segmental pulmonary embolism right upper lobe. Emphysematous changes noted. Bronchial wall thickening with a left lower lobe. Patchy opacification posterior segment right lower lobe with some suggestion consolidation with air bronchogram. Groundglass patchy opacification within the left upper lobe. No pleural effusion or thickening appreciated. No pathologic style adenopathy. No pericardial effusion. TTE (02/25/16): LV normal in size with EF 45-50%. LA mildly dilated & RA normal in size. RV mildly dilated with mild reduction in systolic volume. No aortic stenosis or regurgitation. Aortic root normal in size. Trivial mitral regurgitation without stenosis. No significant pulmonic regurgitation. Trivial tricuspid regurgitation. No pericardial effusion. VENOUS DUPLEX (02/25/16): No evidence of DVT involving right or left lower extremity. Incidentally enlarged lymph node on the right and left.  MICROBIOLOGY: Blood Ctx x2 12/11 >> Quantiferon TB 12/11 >> Sputum PCP 12/12:  Negative  Urine Ctx 12/12:  Multiple species Influenza A PCR 12/12:  Negative  HIV 1 RNA 12/12:  54,000 (CD4 20) Urine Streptococcal Antigen 12/12:  Negative Urine Legionella Antigen 12/12:  Negative  Sputum AFB 12/13 >> Smear Neg / Ctx pending  ANTIBIOTICS: Zithromax 12/12 (1 dose) Rocephin 12/12 (1 dose) Prezcobix >> Tivacay >> Zidovudine  >> Bactrim PO 12/12 >>  HISTORY OF PRESENT ILLNESS: (history obtained through translator) 45 y.o. male with known history of HIV and AIDS as well as a documented history of tuberculosis. Per the admission H&P patient had presented with worsening shortness of breath over the last month. Now dyspnea is occurring even at rest. Patient also endorsed a productive cough as well as pleuritic type chest pain. Patient denied any hemoptysis on admission. He denied any fever or chills but admits to weight loss. Workup showed a CT scan angiogram of the chest with a pulmonary embolism and multifocal infiltrates concerning for pneumonia. Patient was started on broad-spectrum antibiotics with Rocephin and azithromycin and infectious diseases was consulted. Patient. We started on Bactrim for treatment of PCP. PCP DFA was negative except for an elevated LDH. Patient reports that starting 1 month ago he was experiencing pain in his left hemithorax. Reports the pain has resolved. Does report that the pain previously was pleuritic. Reports his dyspnea and cough have also resolved. Denies any subjective fever or chills. Denies any hemoptysis. Cough was productive of a white phlegm. Denies any nausea, vomiting, or diarrhea. Denies any myalgias or joint pain. Denies any sore throat. But has had sinus congestion. No recent sick contacts. No/questionable recent antibiotics.  PAST MEDICAL HISTORY:  Past Medical History:  Diagnosis Date  . Depression    "stress and depression for any man is common" (03/21/2014)  . Hepatitis    "I don't know what hepatitis I have"  . HIV disease ([name])   . TB (pulmonary tuberculosis)    previously treated according to refugee documentation    PAST SURGICAL HISTORY: Past Surgical History:  Procedure Laterality Date  . FRACTURE SURGERY    . IM NAILING TIBIA Right  03/21/2014  . ORIF ANKLE FRACTURE Right 03/21/2014   lateral malleolus/notes 03/21/2014  . ORIF ANKLE FRACTURE Right 03/21/2014    Procedure: OPEN REDUCTION INTERNAL FIXATION (ORIF) pilon ;  Surgeon: Eldred Manges, MD;  Location: [facility] OR;  Service: Orthopedics;  Laterality: Right;  . TIBIA IM NAIL INSERTION Right 03/21/2014   Procedure: INTRAMEDULLARY (IM) NAIL TIBIAL;  Surgeon: Eldred Manges, MD;  Location: [facility] OR;  Service: Orthopedics;  Laterality: Right;    Prior to Admission medications   Medication Sig Start Date End Date Taking? Authorizing Provider  azithromycin (ZITHROMAX) 600 MG tablet Take 2 tablets (1,200 mg total) by mouth once a week. Patient not taking: Reported on 02/25/2016 11/13/13   Judyann Munson, MD  darunavir (PREZISTA) 600 MG tablet Take 1 tablet (600 mg total) by mouth 2 (two) times daily with a meal. Patient not taking: Reported on 02/25/2016 04/19/14   Judyann Munson, MD  dolutegravir (TIVICAY) 50 MG tablet Take 1 tablet (50 mg total) by mouth 2 (two) times daily. Patient not taking: Reported on 02/25/2016 04/20/14   Judyann Munson, MD  guaiFENesin-dextromethorphan [facility] DM) 100-10 MG/5ML syrup Take 5 mLs by mouth every 4 (four) hours as needed for cough. Patient not taking: Reported on 02/25/2016 12/11/13   Judyann Munson, MD  methocarbamol (ROBAXIN) 500 MG tablet Take 1 tablet (500 mg total) by mouth every 6 (six) hours as needed for muscle spasms. Patient not taking: Reported on 02/25/2016 03/23/14   Naida Sleight, PA-C  ondansetron (ZOFRAN ODT) 4 MG disintegrating tablet Take 1 tablet (4 mg total) by mouth every 8 (eight) hours as needed. Patient not taking: Reported on 02/25/2016 03/23/14   Naida Sleight, PA-C  oxyCODONE-acetaminophen (PERCOCET) 10-325 MG per tablet Take 1 tablet by mouth every 6 (six) hours as needed. Patient not taking: Reported on 02/25/2016 03/23/14   Naida Sleight, PA-C  ritonavir (NORVIR) 100 MG TABS tablet Take 1 tablet (100 mg total) by mouth 2 (two) times daily with a meal. Patient not taking: Reported on 02/25/2016 04/19/14   Judyann Munson, MD  sulfamethoxazole-trimethoprim  (BACTRIM) 400-80 MG per tablet Take 1 tablet by mouth daily. Patient not taking: Reported on 02/25/2016 10/26/13   Judyann Munson, MD  zidovudine (RETROVIR) 300 MG tablet Take 1 tablet (300 mg total) by mouth 2 (two) times daily. Patient not taking: Reported on 02/25/2016 11/13/13   Judyann Munson, MD   No Known Allergies  FAMILY HISTORY:  Family History  Problem Relation Age of Onset  . Hypertension Other   . Heart disease Sister    SOCIAL HISTORY: Social History   Social History  . Marital status: Single    Spouse name: N/A  . Number of children: N/A  . Years of education: N/A   Social History Main Topics  . Smoking status: Former Smoker    Packs/day: 0.50    Years: 6.00    Types: Cigarettes    Quit date: 03/16/2001  . Smokeless tobacco: Never Used     Comment: "quit smoking ~ 2003"  . Alcohol use Yes     Comment: drinks bottled beer intermittently  . Drug use: No  . Sexual activity: Not Currently    Partners: Female   Other Topics Concern  . None   Social History Narrative   As of 09/18/2013:   -Arrived in [place]: September 05, 2013    -Refugee from [facility], spent 4 years in refugee camp in [place].    -Language: [name], requires Architect (speaks  essentially no [place])   -Education: no formal education, previously worked as a Product/process development scientist: family phone number [phone], caseworker is Lannette Donath Dar Bi [phone] (with [employer])   -lives with daughter and three sons   -denies alcohol, drug, tobacco abuse      [facility] (02/28/16):   Patient now admitted to drinking bottled beer. Reports continued tobacco abstinence. Denies any bird or mold exposure. No pets currently. No recent travel.    REVIEW OF SYSTEMS:  Patient denies any headache. He denies any focal vision loss. He denies any focal weakness, numbness, or tingling. He denies any rashes or abnormal bruising. A pertinent 14 point review of systems is negative except as per the history of  presenting illness.  SUBJECTIVE: As above.  VITAL SIGNS: Temp:  [97.8 F (36.6 C)-98.1 F (36.7 C)] 98.1 F (36.7 C) (12/15 0635) Pulse Rate:  [75-97] 75 (12/15 0635) Resp:  [16] 16 (12/15 0635) BP: (94-99)/(58-61) 98/61 (12/15 0635) SpO2:  [92 %-99 %] 99 % (12/15 0635) Weight:  [119 lb 4.8 oz (54.1 kg)] 119 lb 4.8 oz (54.1 kg) (12/15 2956)  PHYSICAL EXAMINATION: General:  Awake. Alert. No acute distress.   Integument:  Warm & dry. No rash on exposed skin. No bruising. Lymphatics:  No appreciated cervical or supraclavicular lymphadenoapthy. HEENT:  Moist mucus membranes. No oral ulcers. No scleral injection or icterus. Oral thrush present. Cardiovascular:  Regular rate. No edema. No appreciable JVD.  Pulmonary:  Good aeration & clear to auscultation bilaterally. Symmetric chest wall expansion. No accessory muscle use on nasal cannula oxygen. Abdomen: Soft. Normal bowel sounds. Nondistended. Grossly nontender. Musculoskeletal:  Normal bulk and tone. Hand grip strength 5/5 bilaterally. No joint deformity or effusion appreciated. Neurological:  CN 2-12 grossly in tact. No meningismus. Moving all 4 extremities equally. Symmetric brachioradialis deep tendon reflexes. Psychiatric:  Mood and affect congruent. Speech normal rhythm, rate & tone.    Recent Labs Lab 02/24/16 1858 02/25/16 0328 02/26/16 0450  NA 135 136 138  K 3.7 3.6 4.7  CL 100* 103 106  CO2 28 27 27   BUN 11 9 16   CREATININE 1.01 0.86 0.99  GLUCOSE 138* 95 110*    Recent Labs Lab 02/26/16 0450 02/27/16 0254 02/28/16 0425  HGB 11.3* 11.6* 12.8*  HCT 34.2* 34.1* 38.0*  WBC 3.8* 7.2 6.1  PLT 235 263 277   No results found.  ASSESSMENT / PLAN:  45 y.o. male with multifocal pneumonia and HIV infection with AIDS and CD4 count of 20. Symptomatically the patient seems to be improving on his current regimen of prednisone and Bactrim. It's difficult to discern exactly what the infectious organism is given that his  workup thus far has been inconclusive. Certainly with his elevated LDH PCP must be considered. I had a lengthy discussion with the patient regarding the risks of bronchoscopy including but not limited to vocal cord injury, medication allergy, pneumonia, and potentially death. The patient is willing to undergo the procedure. The patient unfortunately ate breakfast this morning and given that he will require TB isolation for the bronchoscopy this will be unable to be performed until Monday. The etiology for the patient's acute pulmonary embolism is unclear. This largely appears unprovoked.  1. Multifocal Pneumonia: Plan for bronchoscopy with lavage on Monday at 11 AM. I have placed orders for patient to be nothing by mouth after midnight on Monday morning. Sending serum Aspergillus antigen, cryptococcal antigen, and beta D glucan. Checking respiratory viral panel PCR  as well. Antibiotic therapy per ID. 2. Acute Hypoxic Respiratory Failure: Continuing to improve. Secondary to multifocal pneumonia and acute pulmonary embolism. Recommend continuing to wean oxygen as tolerated and I have ordered incentive spirometry for pulmonary toilette. 3. Acute Pulmonary Embolism:  Continuing on systemic anticoagulation utilizing Lovenox bridge to Coumadin for therapeutic anticoagulation. Recommend hematology consultation for further risk assessment and treatment duration.  Remainder of care as per primary service. We will see the patient again on 12/18 and perform bronchoscopy. Please call the on-call physician over the weekend if there are any changes in his clinical status or concerns.   Donna ChristenJennings E. Jamison NeighborNestor, M.D. [facility] Pager:  [phone] After 3pm or if no response, call [phone] 02/28/2016, 9:10 AM

## 2016-02-28 NOTE — Discharge Instructions (Addendum)
Take your azithromycin on Sundays as discussed with the pharmacist.   Information on my medicine - ELIQUIS (apixaban)  This medication education was reviewed with me or my healthcare representative as part of my discharge preparation.    Why was Eliquis prescribed for you?  Eliquis was prescribed to treat blood clots that was found in your lungs (pulmonary embolism) and to reduce the risk of them occurring again.  What do You need to know about Eliquis ? The starting dose is 5 mg (two 2.5 mg tablets) taken TWICE daily for the FIRST SEVEN (7) DAYS, then on 03/10/16  the dose is reduced to ONE 2.5 mg tablet taken TWICE daily.  Eliquis may be taken with or without food.   Try to take the dose about the same time in the morning and in the evening. If you have difficulty swallowing the tablet whole please discuss with your pharmacist how to take the medication safely.  Take Eliquis exactly as prescribed and DO NOT stop taking Eliquis without talking to the doctor who prescribed the medication.  Stopping may increase your risk of developing a new blood clot.  Refill your prescription before you run out.  After discharge, you should have regular check-up appointments with your healthcare provider that is prescribing your Eliquis.    What do you do if you miss a dose? If a dose of ELIQUIS is not taken at the scheduled time, take it as soon as possible on the same day and twice-daily administration should be resumed. The dose should not be doubled to make up for a missed dose.  Important Safety Information A possible side effect of Eliquis is bleeding. You should call your healthcare provider right away if you experience any of the following: ? Bleeding from an injury or your nose that does not stop. ? Unusual colored urine (red or dark brown) or unusual colored stools (red or black). ? Unusual bruising for unknown reasons. ? A serious fall or if you hit your head (even if there is no  bleeding).  Some medicines may interact with Eliquis and might increase your risk of bleeding or clotting while on Eliquis. To help avoid this, consult your healthcare provider or pharmacist prior to using any new prescription or non-prescription medications, including herbals, vitamins, non-steroidal anti-inflammatory drugs (NSAIDs) and supplements.  This website has more information on Eliquis (apixaban): http://www.eliquis.com/eliquis/home

## 2016-02-28 NOTE — Progress Notes (Signed)
Family Medicine Teaching Service Daily Progress Note Intern Pager: [phone]  Patient name: [name] Medical record number: [id number] Date of birth: 06/30/1970 Age: 45 y.o. Gender: male  Primary Care Provider: Renold DonWALDEN,JEFF, MD Consultants: ID, CCM Code Status: full  Pt Overview and Major Events to Date:  12/12- admitted to IM service with shortness of breath 12/15- transferred to FPTS  Assessment and Plan: [name] is a 45 y.o. male with history of AIDS last CD4 count in January 2016 of 60, history of TB, chronic anemia.  PCP pneumonia- multifocal pneumonia seen on CTA chest. With history of AIDS and CD4 count of 60 most recently, he is being treated for PCP pneumonia. Initially treated with CTX and azithromycin. Cultures from sputum have not been able to identify pathogen. Currently on 3L O2 via [place]. -ID following, appreciate recommendations -continue Bactrim on day 4 -BAL planned w/ CCM, appreciate recommendations -wean O2 as able -on course of prednisone 60mg  daily for respiratory distress on admission. On day 3/5. -RVP pending -flu negative  AIDS- WBC 3.8>7.2>6.1. Last CD4 count was 60 January 2016.  -antiretrovirals restarted by ID -recheck CD4 count at 20 -on bactrim for PCP pna  PE- admitted with shortness of breath, small PE dx on CTA chest. DVT negative on LE dopplers. Initially treated with heparin, is being bridged to warfarin -warfarin per pharmacy -concern with use of bactrim and warfarin together, will contact pharmacy about this -Lovenox per pharmacy. -monitor respiratory status  Hx of TB- [place] No hemoptysis. Has had 10kilo weight loss. -on isolation precautions -quant gold pending -first AFB smear negative -consider second smear if patient is able to produce a sample  Normocytic anemia- appears to be chronic. No signs of bleeding. Likely due to malnutrition. Baseline appears to be 12-13. -12.8  today -continue to monitor daily  Cachexia- patient reports 10 kilo weight loss over last several months.  -supplement diet with ensure -consider consult to nutrition  Constipation - patient feels bloated and has not had BM in several days -add miralax daily  FEN/GI: regular diet PPx: warfarin per pharm  Disposition: pending clinical improvement  Subjective:  [name] is interviewed w/ aide of [name] interpreter. He feels much improved since being admitted. No SOB. No cough. No chest pain. Using supplemental oxygen to breath.  Objective: Temp:  [97.8 F (36.6 C)-98.1 F (36.7 C)] 98.1 F (36.7 C) (12/15 0635) Pulse Rate:  [75-97] 75 (12/15 0635) Resp:  [16] 16 (12/15 0635) BP: (94-99)/(58-61) 98/61 (12/15 0635) SpO2:  [92 %-99 %] 99 % (12/15 0635) Weight:  [119 lb 4.8 oz (54.1 kg)] 119 lb 4.8 oz (54.1 kg) (12/15 30860635) Physical Exam: General: cachectic appearing male laying in bed in NAD Cardiovascular: RRR no MRG Respiratory: CTA bilaterally no increased work of breathing Abdomen: soft, non-tender, +BS Extremities: thin, no edema or cyanosis  Laboratory:  Recent Labs Lab 02/26/16 0450 02/27/16 0254 02/28/16 0425  WBC 3.8* 7.2 6.1  HGB 11.3* 11.6* 12.8*  HCT 34.2* 34.1* 38.0*  PLT 235 263 277    Recent Labs Lab 02/24/16 1858 02/25/16 0328 02/26/16 0450  NA 135 136 138  K 3.7 3.6 4.7  CL 100* 103 106  CO2 28 27 27   BUN 11 9 16   CREATININE 1.01 0.86 0.99  CALCIUM 9.0 8.5* 9.3  PROT 8.8* 7.5 7.4  BILITOT 0.5 0.4 0.4  ALKPHOS 597* 485* 461*  ALT 10* 9* 9*  AST 27 22 21  GLUCOSE 138* 95 110*    Imaging/Diagnostic Tests: Dg Chest 2 View  Result Date: 02/24/2016 CLINICAL DATA:  Cough for 1 month EXAM: CHEST  2 VIEW COMPARISON:  10/03/2013 FINDINGS: Cardiac shadow is within normal limits. Lungs are well aerated bilaterally. Patchy infiltrate is noted in the right lung base and superimposed over chronic changes in the left mid lung. No sizable effusion  is seen. No bony abnormality is noted. Diffuse chronic fibrotic changes are noted throughout both lungs. IMPRESSION: Acute on chronic infiltrate within the right base and left mid lung. Electronically Signed   By: Alcide CleverMark  Lukens M.D.   On: 02/24/2016 20:40   Ct Angio Chest Pe W/cm &/or Wo Cm  Result Date: 02/24/2016 CLINICAL DATA:  45 year old male with cough and shortness of breath and chest pain x1 month. EXAM: CT ANGIOGRAPHY CHEST WITH CONTRAST TECHNIQUE: Multidetector CT imaging of the chest was performed using the standard protocol during bolus administration of intravenous contrast. Multiplanar CT image reconstructions and MIPs were obtained to evaluate the vascular anatomy. CONTRAST:  80 cc Isovue 370 COMPARISON:  Chest radiograph dated 02/24/2016 FINDINGS: Cardiovascular: There is no cardiomegaly or pericardial effusion. The thoracic aorta appears unremarkable. The origins of the great vessels of the aortic arch appear patent. The evaluation for pulmonary artery is somewhat limited due to respiratory motion artifact. There is a small filling defect involving the right upper lobe pulmonary artery branch (series 5, image 120). The remainder of the visualized pulmonary arteries appear patent. Mediastinum/Nodes: There is right hilar adenopathy measuring up to 2 cm in short axis. Subcarinal adenopathy as well as aortopulmonic adenopathy noted. The esophagus is grossly unremarkable. No definite thyroid nodules identified. Lungs/Pleura: There is emphysematous changes of the lungs. There are bronchiectatic changes primarily involving the left lower lobe and left upper lobe. There is subpleural collapse with associated scarring in the apices bilaterally. There is a patchy and confluent ground-glass and nodular density primarily involving the right lower lobe most compatible with pneumonia. Left upper lobe interstitial thickening and ground-glass densities likely represent acute infection superimposed on a  background of chronic changes. An area of nodular and ground-glass density also noted in the right middle lobe. Scattered nodularity in the right upper lobe most likely inflammatory/ infectious in etiology. There is no pleural effusion or pneumothorax. The central airways are patent. Upper Abdomen: No acute abnormality. Musculoskeletal: There is loss of subcutaneous fat and cachexia. The osseous structures are intact. Review of the MIP images confirms the above findings. IMPRESSION: Right upper lobe small segmental pulmonary artery embolus. Multifocal pneumonia on a background of emphysema, chronic lung disease, and bronchiectatic changes. Clinical correlation and follow-up resolution recommended. Right hilar and mediastinal adenopathy, likely reactive. Clinical correlation follow-up recommended. Cachexia. These results were called by telephone at the time of interpretation on 02/24/2016 at 10:37 pm to physician assistant LEAPHART , who verbally acknowledged these results. Electronically Signed   By: Elgie CollardArash  Radparvar M.D.   On: 02/24/2016 22:40     Tillman Sersngela C Jesiah Yerby, DO 02/28/2016, 8:05 AM PGY-1, [facility] FPTS Intern pager: [phone], text pages welcome

## 2016-02-28 NOTE — Progress Notes (Signed)
ANTICOAGULATION CONSULT NOTE  Pharmacy Consult for lovenox/warfarin Indication: pulmonary embolus  No Known Allergies  Patient Measurements: Height: 5' 8" (172.7 cm) Weight: 119 lb 4.8 oz (54.1 kg) IBW/kg (Calculated) : 68.4   Vital Signs: Temp: 98.1 F (36.7 C) (12/15 0635) Temp Source: Oral (12/15 0635) BP: 98/61 (12/15 0635) Pulse Rate: 75 (12/15 0635)  Labs:  Recent Labs  02/25/16 1209 02/25/16 1813  02/26/16 0450 02/26/16 1202 02/27/16 0254 02/28/16 0425  HGB  --   --   < > 11.3*  --  11.6* 12.8*  HCT  --   --   --  34.2*  --  34.1* 38.0*  PLT  --   --   --  235  --  263 277  LABPROT  --   --   --   --   --  15.3* 15.1  INR  --   --   --   --   --  1.20 1.18  HEPARINUNFRC 0.62  --   --  0.78* 0.93*  --   --   CREATININE  --   --   --  0.99  --   --   --   TROPONINI <0.03 <0.03  --   --   --   --   --   < > = values in this interval not displayed.  Estimated Creatinine Clearance: 72.9 mL/min (by C-G formula based on SCr of 0.99 mg/dL).   Medical History: Past Medical History:  Diagnosis Date  . Depression    "stress and depression for any man is common" (03/21/2014)  . Hepatitis    "I don't know what hepatitis I have"  . HIV disease ([name])   . TB (pulmonary tuberculosis)    previously treated according to refugee documentation      Assessment: 45 yo male with history of HIV with new onset PE. Patient was on heparin drip and has been transitioned to warfarin with enoxaparin bridge.   As expected, INR remains subtherapeutic today at 1.18 after two doses of warfarin. Will likely begin seeing effects within next few days. Hemoglobin and platelet count have been stable inpatient and no signs of bleeding have been noted.   Of note, patient has low albumin and elevated alk phos. He is also taking Prezcobix and Bactrim which can increase warfarin levels. Steroids can have variable effects on INR. Renal function remains stable.   Will give slightly higher dose  tonight and continue enoxaparin bridge.   Goal of Therapy:  INR 2-3 Monitor platelets by anticoagulation protocol: Yes   Plan:  Enoxaparin 55mg (1mg/kg) [place] every 12 hours as bridge for at least five days and until INR is therapeutic x 2 readings  Warfarin 5mg PO x 1 tonight Daily INR; SCr every 72 hours while on enoxaparin Monitor for signs/symptoms of bleeding Patient will need close INR following upon discharge due to multiple drug interations  Maggie , PharmD Acute Care Pharmacy Resident  Pager: [phone] 02/28/2016     

## 2016-02-28 NOTE — Progress Notes (Signed)
Paged Staci AcostaJ. Nestor, MD regarding order for inducing sputum. Received verbal order for nebulized hypertonic saline. Respiratory will carry out order for inducing sputum. Will continue to monitor.

## 2016-02-29 DIAGNOSIS — R0902 Hypoxemia: Secondary | ICD-10-CM

## 2016-02-29 DIAGNOSIS — B59 Pneumocystosis: Secondary | ICD-10-CM

## 2016-02-29 LAB — BASIC METABOLIC PANEL
ANION GAP: 10 (ref 5–15)
BUN: 25 mg/dL — ABNORMAL HIGH (ref 6–20)
CHLORIDE: 97 mmol/L — AB (ref 101–111)
CO2: 25 mmol/L (ref 22–32)
Calcium: 10.1 mg/dL (ref 8.9–10.3)
Creatinine, Ser: 1.05 mg/dL (ref 0.61–1.24)
GFR calc Af Amer: >60 mL/min (ref >60)
GFR calc non Af Amer: >60 mL/min (ref >60)
GLUCOSE: 98 mg/dL (ref 65–99)
POTASSIUM: 4.9 mmol/L (ref 3.5–5.1)
Sodium: 132 mmol/L — ABNORMAL LOW (ref 135–145)

## 2016-02-29 LAB — CBC
HCT: 39.7 % (ref 39.0–52.0)
HEMOGLOBIN: 13.5 g/dL (ref 13.0–17.0)
MCH: 32.3 pg (ref 26.0–34.0)
MCHC: 34 g/dL (ref 30.0–36.0)
MCV: 95 fL (ref 78.0–100.0)
Platelets: 307 10*3/uL (ref 150–400)
RBC: 4.18 MIL/uL — ABNORMAL LOW (ref 4.22–5.81)
RDW: 11.6 % (ref 11.5–15.5)
WBC: 5.1 10*3/uL (ref 4.0–10.5)

## 2016-02-29 LAB — CULTURE, BLOOD (ROUTINE X 2)
Culture: NO GROWTH
Culture: NO GROWTH

## 2016-02-29 LAB — PROTIME-INR
INR: 1.29
Prothrombin Time: 16.2 seconds — ABNORMAL HIGH (ref 11.4–15.2)

## 2016-02-29 MED ORDER — WARFARIN SODIUM 5 MG PO TABS
5.0000 mg | ORAL_TABLET | Freq: Once | ORAL | Status: AC
  Administered 2016-02-29: 5 mg via ORAL
  Filled 2016-02-29: qty 1

## 2016-02-29 NOTE — Progress Notes (Signed)
Patient given morning meds, immediately ran to the bathroom and vomited in toilet.  RN called interpreter to find out if this was a recurrent issue, patient states he feels full all the time and often vomits if he tries to eat or drink anything.  Zofran given.  Also had interpreter discuss with patient vaccines including flu and pneumonia to which patient agreed to and stated he had never had any reaction or problem with eggs, latex or gelatin or other vaccines.    Patient does want me to relay to the physician that he has this recurrent vomiting.

## 2016-02-29 NOTE — Progress Notes (Addendum)
[place] for Lovenox/Warfarin Indication: pulmonary embolus  No Known Allergies  Patient Measurements: Height: '5\' 8"'  (172.7 cm) Weight: 116 lb 6.4 oz (52.8 kg) (scale C) IBW/kg (Calculated) : 68.4   Vital Signs: Temp: 98.4 F (36.9 C) (12/16 1200) Temp Source: Oral (12/16 1200) BP: 102/66 (12/16 1200) Pulse Rate: 83 (12/16 1200)  Labs:  Recent Labs  02/27/16 0254 02/28/16 0425 02/29/16 0347  HGB 11.6* 12.8* 13.5  HCT 34.1* 38.0* 39.7  PLT 263 277 307  LABPROT 15.3* 15.1 16.2*  INR 1.20 1.18 1.29  CREATININE  --   --  1.05    Estimated Creatinine Clearance: 67 mL/min (by C-G formula based on SCr of 1.05 mg/dL).   Medical History: Past Medical History:  Diagnosis Date  . Depression    "stress and depression for any man is common" (03/21/2014)  . Hepatitis    "I don't know what hepatitis I have"  . HIV disease ([place])   . TB (pulmonary tuberculosis)    previously treated according to refugee documentation      Assessment: 45 yo male with history of HIV with new onset PE. Patient was on heparin drip and has been transitioned to warfarin with enoxaparin bridge.   As expected, INR remains subtherapeutic today at 1.29 after three doses of warfarin--being cautious with warfarin dosing due to DDI with bactrim. Will likely begin seeing effects within next few days. Hemoglobin and platelet count have been stable inpatient and no signs of bleeding have been noted.   Of note, patient has low albumin and elevated alk phos. He is also taking Prezcobix and Bactrim which can increase warfarin levels (will be cautious with dosing). Steroids can have variable effects on INR. Renal function remains stable.   Goal of Therapy:  INR 2-3 Monitor platelets by anticoagulation protocol: Yes   Plan:  Enoxaparin 87m (163mkg) subcutaneous every 12 hours as bridge for at least five days and until INR is therapeutic x 2 readings  Warfarin 5 mg PO x  1 tonight  Daily INR; SCr every 72 hours while on enoxaparin Monitor for signs/symptoms of bleeding Patient will need close INR following upon discharge due to multiple drug interations  JaNarda BondsPharmD, BCPS Clinical Pharmacist Phone: [phone]/16/2017 12:34 PM

## 2016-02-29 NOTE — Progress Notes (Signed)
Family Medicine Teaching Service Daily Progress Note Intern Pager: [phone]  Patient name: [name] Medical record number: [id number] Date of birth: 11/13/1970 Age: 45 y.o. Gender: male  Primary Care Provider: Renold DonWALDEN,JEFF, MD Consultants: ID, CCM Code Status: full  Pt Overview and Major Events to Date:  12/12- admitted to IM service with shortness of breath 12/15- transferred to FPTS  Assessment and Plan: [name] is a 45 y.o. male with history of AIDS last CD4 count in January 2016 of 60, history of TB, chronic anemia.  PCP pneumonia- multifocal pneumonia seen on CTA chest. With history of AIDS and CD4 count of 60 most recently, he is being treated for PCP pneumonia. Initially treated with CTX and azithromycin. Cultures from sputum negative for PCP, athogen, however ID strongly believes patient has PCP. Negative legionella, strep pneumonia antigen. Acid fast smear to rule out MAC and TB. Remains of 3 L [place] .  -ID following, appreciate recommendations -continue Bactrim on day 4 for a total of 3 weeks  -BAL planned w/ CCM, appreciate recommendations - on 12/18  -wean O2 as able - may need home oxygen at discharge  - Prednisone 60mg  daily for 5/5, then Prednisone 40 mg for 0/5 days, then Prednisone 20 mg for 0/11 days -- for a total of 21 day course  -RVP pending - flu and cryptococcal antigen negative, aspergillus pending - Acid fast culture pending    AIDS- WBC 3.8>6.1>5.1. Last CD4 count was 60 January 2016.  - antiretrovirals restarted by ID - recheck CD4 count at 20 - Continue bactrim for PCP pneumonia - will likely need prophylaxis following treatment of PCP  - As patient is on ART therapy, no recommendation for azithromycin for MAC   PE- admitted with shortness of breath, small PE dx on CTA chest. DVT negative on LE dopplers. Initially treated with heparin, is being bridged to warfarin. Discussed interaction with warfarin/bactrim. No issues.  -warfarin per  pharmacy -Lovenox per pharmacy. -monitor respiratory status  Hx of TB- hx of [place] 15 years [place] No hemoptysis. Has had 10kilo weight loss. -on isolation precautions -quant gold pending -first AFB smear negative, AFB culture pending   Normocytic anemia- appears to be chronic. No signs of bleeding. Likely due to malnutrition. Baseline appears to be 12-13. -12.8 today -continue to monitor daily  Cachexia- patient reports 10 kilo weight loss over last several months.  -supplement diet with ensure -consider consult to nutrition  Constipation - patient feels bloated and has not had BM in several days -add miralax daily  FEN/GI: regular diet PPx: warfarin per pharm  Disposition: pending clinical improvement  Subjective:  Patient is doing well. Denies any pain. Feels SOB without the oxygen in place  Objective: Temp:  [97.9 F (36.6 C)-98 F (36.7 C)] 98 F (36.7 C) (12/16 0553) Pulse Rate:  [80-88] 88 (12/16 0553) Resp:  [18] 18 (12/16 0553) BP: (98-111)/(63-67) 111/67 (12/16 0553) SpO2:  [90 %-97 %] 97 % (12/16 0553) Weight:  [116 lb 6.4 oz (52.8 kg)] 116 lb 6.4 oz (52.8 kg) (12/16 0553) Physical Exam: General: cachectic appearing male laying in bed in NAD Cardiovascular: RRR no MRG Respiratory: CTA bilaterally no increased work of breathing Abdomen: soft, non-tender, +BS Extremities: thin, no edema or cyanosis  Laboratory:  Recent Labs Lab 02/27/16 0254 02/28/16 0425 02/29/16 0347  WBC 7.2 6.1 5.1  HGB 11.6* 12.8* 13.5  HCT 34.1* 38.0* 39.7  PLT 263 277 307    Recent Labs Lab  02/24/16 1858 02/25/16 0328 02/26/16 0450 02/29/16 0347  NA 135 136 138 132*  K 3.7 3.6 4.7 4.9  CL 100* 103 106 97*  CO2 28 27 27 25   BUN 11 9 16  25*  CREATININE 1.01 0.86 0.99 1.05  CALCIUM 9.0 8.5* 9.3 10.1  PROT 8.8* 7.5 7.4  --   BILITOT 0.5 0.4 0.4  --   ALKPHOS 597* 485* 461*  --   ALT 10* 9* 9*  --   AST 27 22 21   --   GLUCOSE 138* 95 110* 98     Imaging/Diagnostic Tests: No results found.   Kelee Cunningham Mayra ReelZahra Kyleeann Cremeans, MD 02/29/2016, 8:35 AM PGY-2  [facility] FPTS Intern pager: [phone], text pages welcome

## 2016-03-01 LAB — QUANTIFERON IN TUBE
QFT TB AG MINUS NIL VALUE: 0.02 [IU]/mL
QUANTIFERON MITOGEN VALUE: 4.1 IU/mL
QUANTIFERON NIL VALUE: 0.1 [IU]/mL
QUANTIFERON TB AG VALUE: 0.12 [IU]/mL
QUANTIFERON TB GOLD: NEGATIVE

## 2016-03-01 LAB — BASIC METABOLIC PANEL
Anion gap: 10 (ref 5–15)
BUN: 33 mg/dL — ABNORMAL HIGH (ref 6–20)
CHLORIDE: 94 mmol/L — AB (ref 101–111)
CO2: 27 mmol/L (ref 22–32)
CREATININE: 1.21 mg/dL (ref 0.61–1.24)
Calcium: 10.3 mg/dL (ref 8.9–10.3)
GFR calc non Af Amer: >60 mL/min (ref >60)
Glucose, Bld: 91 mg/dL (ref 65–99)
POTASSIUM: 5.2 mmol/L — AB (ref 3.5–5.1)
SODIUM: 131 mmol/L — AB (ref 135–145)

## 2016-03-01 LAB — CBC
HEMATOCRIT: 39.7 % (ref 39.0–52.0)
HEMOGLOBIN: 13.6 g/dL (ref 13.0–17.0)
MCH: 32.5 pg (ref 26.0–34.0)
MCHC: 34.3 g/dL (ref 30.0–36.0)
MCV: 94.7 fL (ref 78.0–100.0)
Platelets: 339 10*3/uL (ref 150–400)
RBC: 4.19 MIL/uL — AB (ref 4.22–5.81)
RDW: 11.6 % (ref 11.5–15.5)
WBC: 4.8 10*3/uL (ref 4.0–10.5)

## 2016-03-01 LAB — PROTIME-INR
INR: 1.44
Prothrombin Time: 17.6 seconds — ABNORMAL HIGH (ref 11.4–15.2)

## 2016-03-01 LAB — QUANTIFERON TB GOLD ASSAY (BLOOD)

## 2016-03-01 MED ORDER — SODIUM CHLORIDE 0.9 % IV SOLN
INTRAVENOUS | Status: AC
  Administered 2016-03-01: 12:00:00 via INTRAVENOUS

## 2016-03-01 MED ORDER — SODIUM CHLORIDE 0.9 % IV SOLN: INTRAVENOUS | Status: DC

## 2016-03-01 MED ORDER — SODIUM POLYSTYRENE SULFONATE 15 GM/60ML PO SUSP
30.0000 g | Freq: Once | ORAL | Status: AC
  Administered 2016-03-01: 30 g via ORAL
  Filled 2016-03-01: qty 120

## 2016-03-01 MED ORDER — WARFARIN SODIUM 7.5 MG PO TABS
7.5000 mg | ORAL_TABLET | Freq: Once | ORAL | Status: AC
  Administered 2016-03-01: 7.5 mg via ORAL
  Filled 2016-03-01: qty 1

## 2016-03-01 MED ORDER — PANTOPRAZOLE SODIUM 40 MG PO TBEC
40.0000 mg | DELAYED_RELEASE_TABLET | Freq: Every day | ORAL | Status: DC
  Administered 2016-03-01 – 2016-03-04 (×4): 40 mg via ORAL
  Filled 2016-03-01 (×4): qty 1

## 2016-03-01 MED ORDER — PREDNISONE 10 MG PO TABS
60.0000 mg | ORAL_TABLET | Freq: Every day | ORAL | Status: DC
  Administered 2016-03-01 – 2016-03-04 (×3): 60 mg via ORAL
  Filled 2016-03-01 (×3): qty 1

## 2016-03-01 NOTE — Progress Notes (Signed)
Spoke with patient through interpreter service regarding medication changes made this morning.  Discussed use of Kayexalate d/t his potassium being high and that the expectation is this medication will make him have a bowel movement to help get rid of the excess potassium.  Also discussed use of protonix and IV fluids.  Patient voiced understanding through interpreter of all of the above and had no further concerns for nurse.

## 2016-03-01 NOTE — Progress Notes (Signed)
Spoke with patient through interpreter this morning.  Patient states his breathing is fine, no issue.  O2 sat 91% on 3 liters, unable to wean.  Patient states his nausea is still his main issue, he does feel the Zofran I gave him yesterday helped so I did administer another dose this morning.  He did confirm through the interpreter that his bowels moved yesterday 02/29/16.  He denies any issue or concern to this nurse other than his stomach.

## 2016-03-01 NOTE — Progress Notes (Signed)
ANTICOAGULATION CONSULT NOTE  Pharmacy Consult for Lovenox/Warfarin Indication: pulmonary embolus  No Known Allergies  Patient Measurements: Height: 5\' 8"  (172.7 cm) Weight: 114 lb 1.6 oz (51.8 kg) IBW/kg (Calculated) : 68.4   Vital Signs: Temp: 98.2 F (36.8 C) (12/17 1253) Temp Source: Oral (12/17 1253) BP: 92/63 (12/17 1253) Pulse Rate: 81 (12/17 1253)  Labs:  Recent Labs  02/28/16 0425 02/29/16 0347 03/01/16 0442  HGB 12.8* 13.5 13.6  HCT 38.0* 39.7 39.7  PLT 277 307 339  LABPROT 15.1 16.2* 17.6*  INR 1.18 1.29 1.44  CREATININE  --  1.05 1.21    Estimated Creatinine Clearance: 57.1 mL/min (by C-G formula based on SCr of 1.21 mg/dL).   Medical History: Past Medical History:  Diagnosis Date  . Depression    "stress and depression for any man is common" (03/21/2014)  . Hepatitis    "I don't know what hepatitis I have"  . HIV disease ([name])   . TB (pulmonary tuberculosis)    previously treated according to refugee documentation      Assessment: 45 yo male with history of HIV with new onset PE. Patient was on heparin drip and has been transitioned to warfarin with enoxaparin bridge.   As expected, INR remains subtherapeutic today at 1.44 after four doses of warfarin--being cautious with warfarin dosing due to DDI with bactrim. Expect to continue to see effects within next few days. Hemoglobin and platelet count have been stable inpatient and no signs of bleeding have been noted.   Goal of Therapy:  INR 2-3 Monitor platelets by anticoagulation protocol: Yes   Plan:  Enoxaparin 55mg  (1mg /kg) subcutaneous every 12 hours as bridge until INR is therapeutic x 2 readings  Warfarin 7.5 mg PO x 1 tonight  Daily INR; SCr every 72 hours while on enoxaparin Monitor for signs/symptoms of bleeding Patient will need close INR following upon discharge due to multiple drug interations  Sheppard CoilFrank Wilson PharmD., BCPS Clinical Pharmacist Pager [phone] 03/01/2016 1:18  PM

## 2016-03-01 NOTE — Progress Notes (Signed)
Family Medicine Teaching Service Daily Progress Note Intern Pager: [phone]  Patient name: Anthony IbaJean-Paul P Leask Medical record number: [id number] Date of birth: 03/20/1970 Age: 45 y.o. Gender: male  Primary Care Provider: Renold DonWALDEN,JEFF, MD Consultants: ID, CCM Code Status: full  Pt Overview and Major Events to Date:  12/12- admitted to IM service with shortness of breath 12/15- transferred to FPTS  Assessment and Plan: [name] is a 45 y.o. male with history of AIDS last CD4 count in January 2016 of 60, history of TB, chronic anemia.  PCP pneumonia- CTA with multifocal pneumonia. Patient with AIDS, CD4 count of 20, VL 54K. Initially treated with CTX and azithromycin. Cultures from sputum negative for PCP, however ID strongly believes patient has PCP. Negative legionella, strep pneumonia antigen. First AFB smear on 12/13 negative. Culture pending. Flu, RVP and cryptococcal antigen negative. Remains on 3 L [place]. Denies shortness of breath or chest pain.  -ID following, appreciate recommendations -Continue Bactrim 12/12>>01/03 (3 weeks) -BAL by CCM on 12/18  -Prednisone 60mg  daily for 5/5, then  40 mg for 1/5 days, then 20 mg for 0/11 days -- for a total of 21 days -Aspergillus and acid fast culture pending -Follow up second AFB sent on 12/15 -wean O2 as able -OOB  HIV/AIDS- highly drug-resistant virus. WBC 3.8>6.1>5.1. CD4 20 VL 54K - Antiretrovirals restarted by ID - When discharge, can go home on  Bactrim for PCP prophylaxis per ID - No recommendation for azithromycin for MAC while on ART per ID  PE- admitted with shortness of breath, small PE dx on CTA chest. DVT negative on LE dopplers. Initially treated with heparin, is being bridged to warfarin. Discussed interaction with warfarin/bactrim. No issues.  INR 1.44 03/01/2016.  -warfarin per pharmacy 02/25/2016>>. -Lovenox per pharmacy. -monitor respiratory status  Hx of TB- hx of TB 15 years ago [place]. No  hemoptysis. Has had 10kilo weight loss. -on isolation precautions -quant gold pending -first AFB smear negative, AFB culture pending   Normocytic anemia- appears to be chronic. No signs of bleeding. Likely due to malnutrition. Baseline appears to be 12-13. -12.8 today -continue to monitor daily  Cachexia- patient reports 10 kilo weight loss over last several months.  -supplement diet with ensure -consider consult to nutrition  HypoNa and HyperK: Na 131 and K 5.2. Likely due to bactrim vs. SIADH -NS at 100 ml/hr for 12 hours -kayexalate -BMP in the morning  Epigastric pain: reports epigastric pain with medications. He is on steroid -Pantoprazole 40 mg daily  Constipation - had normal BM on 1216 -MiraLAX as needed  FEN/GI: regular diet  PPx: on full anticoagulation for PE  Disposition: home pending clinical improvement. Lives alone. Has sister who lives in [place].  Subjective:  One episode of mild emesis after cough overnight. Emesis is nonbloody. Reports epigastric pain with medications. Had a bowel movement yesterday which was normal. Denies shortness of breath or chest pain. He is eating his breakfast.  Objective: Temp:  [98 F (36.7 C)-98.4 F (36.9 C)] 98 F (36.7 C) (12/17 0502) Pulse Rate:  [80-85] 85 (12/17 0502) Resp:  [16-18] 16 (12/17 0502) BP: (96-102)/(61-66) 96/61 (12/17 0502) SpO2:  [92 %-95 %] 92 % (12/17 0502) Weight:  [114 lb 1.6 oz (51.8 kg)] 114 lb 1.6 oz (51.8 kg) (12/17 0502) Physical Exam: General: cachectic appearing male laying in bed in NAD Cardiovascular: RRR no MRG Respiratory: [place] in place, no increased work of breathing, CTA bilaterally no increased work of breathing Abdomen: soft,  non-tender, +BS Extremities: thin, no edema or cyanosis Neuro: Awake, alert and oriented appropriately, no gross neuro deficits Psych: Euthymic mood with congruent affect  Laboratory:  Recent Labs Lab 02/28/16 0425 02/29/16 0347 03/01/16 0442  WBC 6.1  5.1 4.8  HGB 12.8* 13.5 13.6  HCT 38.0* 39.7 39.7  PLT 277 307 339    Recent Labs Lab 02/24/16 1858 02/25/16 0328 02/26/16 0450 02/29/16 0347 03/01/16 0442  NA 135 136 138 132* 131*  K 3.7 3.6 4.7 4.9 5.2*  CL 100* 103 106 97* 94*  CO2 28 27 27 25 27   BUN 11 9 16  25* 33*  CREATININE 1.01 0.86 0.99 1.05 1.21  CALCIUM 9.0 8.5* 9.3 10.1 10.3  PROT 8.8* 7.5 7.4  --   --   BILITOT 0.5 0.4 0.4  --   --   ALKPHOS 597* 485* 461*  --   --   ALT 10* 9* 9*  --   --   AST 27 22 21   --   --   GLUCOSE 138* 95 110* 98 91    Imaging/Diagnostic Tests: No results found.  Almon Herculesaye T Gonfa, MD 03/01/2016, 7:15 AM PGY-2 [facility] FPTS Intern pager: [phone], text pages welcome

## 2016-03-02 ENCOUNTER — Telehealth: Payer: Self-pay | Admitting: *Deleted

## 2016-03-02 ENCOUNTER — Ambulatory Visit: Payer: Self-pay | Admitting: *Deleted

## 2016-03-02 ENCOUNTER — Inpatient Hospital Stay (HOSPITAL_COMMUNITY): Payer: Commercial Managed Care - PPO

## 2016-03-02 ENCOUNTER — Encounter (HOSPITAL_COMMUNITY)
Admission: EM | Disposition: A | Discharge status: Home / Self Care | Payer: Self-pay | Source: Home / Self Care | Attending: Family Medicine

## 2016-03-02 DIAGNOSIS — B20 Human immunodeficiency virus [HIV] disease: Secondary | ICD-10-CM

## 2016-03-02 DIAGNOSIS — R0902 Hypoxemia: Secondary | ICD-10-CM

## 2016-03-02 HISTORY — PX: VIDEO BRONCHOSCOPY: SHX5072

## 2016-03-02 LAB — BODY FLUID CELL COUNT WITH DIFFERENTIAL
Eos, Fluid: 0 %
Lymphs, Fluid: 8 %
Monocyte-Macrophage-Serous Fluid: 8 % — ABNORMAL LOW (ref 50–90)
Neutrophil Count, Fluid: 84 % — ABNORMAL HIGH (ref 0–25)
WBC FLUID: 79 uL (ref 0–1000)

## 2016-03-02 LAB — MISC LABCORP TEST (SEND OUT): Labcorp test code: 284526

## 2016-03-02 LAB — CBC
HEMATOCRIT: 37.3 % — AB (ref 39.0–52.0)
Hemoglobin: 12.5 g/dL — ABNORMAL LOW (ref 13.0–17.0)
MCH: 32.3 pg (ref 26.0–34.0)
MCHC: 33.5 g/dL (ref 30.0–36.0)
MCV: 96.4 fL (ref 78.0–100.0)
PLATELETS: 284 10*3/uL (ref 150–400)
RBC: 3.87 MIL/uL — AB (ref 4.22–5.81)
RDW: 11.8 % (ref 11.5–15.5)
WBC: 3.7 10*3/uL — ABNORMAL LOW (ref 4.0–10.5)

## 2016-03-02 LAB — BASIC METABOLIC PANEL
Anion gap: 7 (ref 5–15)
BUN: 29 mg/dL — AB (ref 6–20)
CO2: 29 mmol/L (ref 22–32)
CREATININE: 1.02 mg/dL (ref 0.61–1.24)
Calcium: 9.4 mg/dL (ref 8.9–10.3)
Chloride: 97 mmol/L — ABNORMAL LOW (ref 101–111)
Glucose, Bld: 107 mg/dL — ABNORMAL HIGH (ref 65–99)
Potassium: 4.9 mmol/L (ref 3.5–5.1)
SODIUM: 133 mmol/L — AB (ref 135–145)

## 2016-03-02 LAB — PROTIME-INR
INR: 1.72
Prothrombin Time: 20.4 seconds — ABNORMAL HIGH (ref 11.4–15.2)

## 2016-03-02 SURGERY — VIDEO BRONCHOSCOPY WITHOUT FLUORO
Anesthesia: Moderate Sedation | Laterality: Bilateral

## 2016-03-02 MED ORDER — WARFARIN SODIUM 5 MG PO TABS
5.0000 mg | ORAL_TABLET | Freq: Once | ORAL | Status: AC
  Administered 2016-03-02: 5 mg via ORAL
  Filled 2016-03-02: qty 1

## 2016-03-02 MED ORDER — LIDOCAINE HCL 2 % EX GEL
CUTANEOUS | Status: DC | PRN
  Administered 2016-03-02: 1

## 2016-03-02 MED ORDER — FENTANYL CITRATE (PF) 100 MCG/2ML IJ SOLN
INTRAMUSCULAR | Status: DC | PRN
  Administered 2016-03-02 (×2): 50 ug via INTRAVENOUS

## 2016-03-02 MED ORDER — FENTANYL CITRATE (PF) 100 MCG/2ML IJ SOLN
INTRAMUSCULAR | Status: AC
  Filled 2016-03-02: qty 4

## 2016-03-02 MED ORDER — PHENYLEPHRINE HCL 0.25 % NA SOLN: 1.0000 | Freq: Four times a day (QID) | NASAL | Status: DC | PRN

## 2016-03-02 MED ORDER — MIDAZOLAM HCL 5 MG/ML IJ SOLN
INTRAMUSCULAR | Status: AC
  Filled 2016-03-02: qty 2

## 2016-03-02 MED ORDER — BUTAMBEN-TETRACAINE-BENZOCAINE 2-2-14 % EX AERO: 1.0000 | INHALATION_SPRAY | Freq: Once | CUTANEOUS | Status: DC

## 2016-03-02 MED ORDER — LIDOCAINE HCL (PF) 1 % IJ SOLN
INTRAMUSCULAR | Status: DC | PRN
  Administered 2016-03-02: 6 mL

## 2016-03-02 MED ORDER — PHENYLEPHRINE HCL 0.25 % NA SOLN
NASAL | Status: DC | PRN
  Administered 2016-03-02: 2 via NASAL

## 2016-03-02 MED ORDER — MIDAZOLAM HCL 10 MG/2ML IJ SOLN
INTRAMUSCULAR | Status: DC | PRN
  Administered 2016-03-02 (×2): 2 mg via INTRAVENOUS

## 2016-03-02 MED ORDER — LIDOCAINE HCL 2 % EX GEL: 1.0000 "application " | Freq: Once | CUTANEOUS | Status: DC

## 2016-03-02 MED ORDER — BOOST / RESOURCE BREEZE PO LIQD: 1.0000 | Freq: Three times a day (TID) | ORAL | Status: DC | PRN

## 2016-03-02 NOTE — Consult Note (Addendum)
Name: [name] MRN: [id number] DOB: 10/05/1970    ADMISSION DATE:  02/24/2016 CONSULTATION DATE:  02/28/2016  REFERRING MD :  Marcellus ScottAnand Hongalgi, M.D. / [facility]  CHIEF COMPLAINT:  Multifocal Pneumonia   brief (history obtained through translator) 45 y.o. male with known history of HIV and AIDS as well as a documented history of tuberculosis. Per the admission H&P patient had presented with worsening shortness of breath over the last month. Now dyspnea is occurring even at rest. Patient also endorsed a productive cough as well as pleuritic type chest pain. Patient denied any hemoptysis on admission. He denied any fever or chills but admits to weight loss. Workup showed a CT scan angiogram of the chest with a pulmonary embolism and multifocal infiltrates concerning for pneumonia. Patient was started on broad-spectrum antibiotics with Rocephin and azithromycin and infectious diseases was consulted. Patient. We started on Bactrim for treatment of PCP. PCP DFA was negative except for an elevated LDH. Patient reports that starting 1 month ago he was experiencing pain in his left hemithorax. Reports the pain has resolved. Does report that the pain previously was pleuritic. Reports his dyspnea and cough have also resolved. Denies any subjective fever or chills. Denies any hemoptysis. Cough was productive of a white phlegm. Denies any nausea, vomiting, or diarrhea. Denies any myalgias or joint pain. Denies any sore throat. But has had sinus congestion. No recent sick contacts. No/questionable recent antibiotics.   SIGNIFICANT EVENTS  12/11 - Admit  STUDIES:  CTA CHEST 02/24/16:  Personally reviewed by me. Segmental pulmonary embolism right upper lobe. Emphysematous changes noted. Bronchial wall thickening with a left lower lobe. Patchy opacification posterior segment right lower lobe with some suggestion consolidation with air bronchogram. [place] patchy opacification within the left upper lobe.  No pleural effusion or thickening appreciated. No pathologic style adenopathy. No pericardial effusion. TTE (02/25/16): LV normal in size with EF 45-50%. LA mildly dilated & RA normal in size. RV mildly dilated with mild reduction in systolic volume. No aortic stenosis or regurgitation. Aortic root normal in size. Trivial mitral regurgitation without stenosis. No significant pulmonic regurgitation. Trivial tricuspid regurgitation. No pericardial effusion. VENOUS DUPLEX (02/25/16): No evidence of DVT involving right or left lower extremity. Incidentally enlarged lymph node on the right and left.  MICROBIOLOGY: Blood Ctx x2 12/11 >> Quantiferon TB 12/11 >> Sputum PCP 12/12:  Negative  Urine Ctx 12/12:  Multiple species Influenza A PCR 12/12:  Negative  HIV 1 RNA 12/12:  54,000 (CD4 20) Urine Streptococcal Antigen 12/12:  Negative Urine Legionella Antigen 12/12:  Negative  Sputum AFB 12/13 >> Smear Neg / Ctx pending  ANTIBIOTICS: Zithromax 12/12 (1 dose) Rocephin 12/12 (1 dose) Prezcobix >> Tivacay >> Zidovudine >> Bactrim PO 12/12 >>    SUBJECTIVE/OVERNIGHT/INTERVAL HX 03/02/16 - able to communicate in English and understand he needs bronch and the risks, bneefits and limitation. Dr Chanetta Marshallimberlake of FPTS at bedside as well. In 3L [place] - pulse ox 100%. Denies any resp complaints   VITAL SIGNS: Temp:  [97.8 F (36.6 C)-98.2 F (36.8 C)] 97.8 F (36.6 C) (12/18 0552) Pulse Rate:  [75-81] 75 (12/18 0552) Resp:  [16-18] 16 (12/18 0552) BP: (92-111)/(60-70) 111/70 (12/18 0552) SpO2:  [97 %-99 %] 99 % (12/18 0552) Weight:  [52.5 kg (115 lb 12.8 oz)] 52.5 kg (115 lb 12.8 oz) (12/18 0552)  PHYSICAL EXAMINATION: General:  Awake. Alert. No acute distress.   Integument:  Warm & dry. No rash on exposed skin. No bruising. Lymphatics:  No appreciated cervical or supraclavicular lymphadenoapthy. HEENT:  Moist mucus membranes. No oral ulcers. No scleral injection or icterus. Oral thrush  present. Cardiovascular:  Regular rate. No edema. No appreciable JVD.  Pulmonary:  Good aeration & clear to auscultation bilaterally. Symmetric chest wall expansion. No accessory muscle use on nasal cannula oxygen. Abdomen: Soft. Normal bowel sounds. Nondistended. Grossly nontender. Musculoskeletal:  Normal bulk and tone. Hand grip strength 5/5 bilaterally. No joint deformity or effusion appreciated. Neurological:  CN 2-12 grossly in tact. No meningismus. Moving all 4 extremities equally. Symmetric brachioradialis deep tendon reflexes. Psychiatric:  Mood and affect congruent. Speech normal rhythm, rate & tone.   PULMONARY No results for input(s): PHART, PCO2ART, PO2ART, HCO3, TCO2, O2SAT in the last 168 hours.  Invalid input(s): PCO2, PO2  CBC  Recent Labs Lab 02/29/16 0347 03/01/16 0442 03/02/16 0344  HGB 13.5 13.6 12.5*  HCT 39.7 39.7 37.3*  WBC 5.1 4.8 3.7*  PLT 307 339 284    COAGULATION  Recent Labs Lab 02/27/16 0254 02/28/16 0425 02/29/16 0347 03/01/16 0442 03/02/16 0344  INR 1.20 1.18 1.29 1.44 1.72    CARDIAC   Recent Labs Lab 02/25/16 0628 02/25/16 1209 02/25/16 1813  TROPONINI <0.03 <0.03 <0.03   No results for input(s): PROBNP in the last 168 hours.   CHEMISTRY  Recent Labs Lab 02/25/16 0328 02/26/16 0450 02/29/16 0347 03/01/16 0442 03/02/16 0344  NA 136 138 132* 131* 133*  K 3.6 4.7 4.9 5.2* 4.9  CL 103 106 97* 94* 97*  CO2 27 27 25 27 29   GLUCOSE 95 110* 98 91 107*  BUN 9 16 25* 33* 29*  CREATININE 0.86 0.99 1.05 1.21 1.02  CALCIUM 8.5* 9.3 10.1 10.3 9.4  MG  --  1.9  --   --   --    Estimated Creatinine Clearance: 68.6 mL/min (by C-G formula based on SCr of 1.02 mg/dL).   LIVER  Recent Labs Lab 02/24/16 1858 02/25/16 0328 02/26/16 0450 02/27/16 0254 02/28/16 0425 02/29/16 0347 03/01/16 0442 03/02/16 0344  AST 27 22 21   --   --   --   --   --   ALT 10* 9* 9*  --   --   --   --   --   ALKPHOS 597* 485* 461*  --   --    --   --   --   BILITOT 0.5 0.4 0.4  --   --   --   --   --   PROT 8.8* 7.5 7.4  --   --   --   --   --   ALBUMIN 2.3* 2.0* 1.9*  --   --   --   --   --   INR  --   --   --  1.20 1.18 1.29 1.44 1.72     INFECTIOUS  Recent Labs Lab 02/24/16 1945  LATICACIDVEN 1.47     ENDOCRINE CBG (last 3)  No results for input(s): GLUCAP in the last 72 hours.       IMAGING x48h  - image(s) personally visualized  -   highlighted in bold No results found.      ASSESSMENT / PLAN:  45 y.o. male with multifocal pneumonia and HIV infection with AIDS and CD4 count of 20. Symptomatically the patient seems to be improving on his current regimen of prednisone and Bactrim.    1. Multifocal Pneumonia: Plan for bronchoscopy with lavage on Monday 03/02/2016  at 11 AM.  I have placed orders for patient to be nothing by mouth after midnight on Monday morning.   - Risks of pneumothorax, hemothorax, sedation/anesthesia complications such as cardiac or respiratory arrest or hypotension, stroke and bleeding all explained. Benefits of diagnosis but limitations of non-diagnosis also explained. Patient verbalized understanding and wished to proceed.   - Sending serum Aspergillus antigen, Checking respiratory viral panel PCR as well.  And Cell count and cytology  - will aim for RLL area of infiltrate  2. Acute Hypoxic Respiratory Failure: Continuing to improve. Secondary to multifocal pneumonia and acute pulmonary embolism. Recommend continuing to wean oxygen as tolerated and I have ordered incentive spirometry for pulmonary toilette. 3. Acute Pulmonary Embolism:  Continuing on systemic anticoagulation utilizing Lovenox bridge to Coumadin for therapeutic anticoagulation.  Last dose Lovenox 5.45am 03/02/2016 - risk for epistaxis with bronch + will try to minimize 4.     Dr. Kalman ShanMurali Dannel Rafter, M.D., [facility]C.P Pulmonary and Critical Care Medicine Staff Physician [facility] [facility]  Critical [facility] Pager: [phone], If no answer or between  15:00h - 7:00h: call [phone]  03/02/2016 9:37 AM

## 2016-03-02 NOTE — Progress Notes (Signed)
Family Medicine Teaching Service Daily Progress Note Intern Pager: [phone]  Patient name: Antonieta IbaJean-Paul P Detienne Medical record number: [id number] Date of birth: 06/04/1970 Age: 45 y.o. Gender: male  Primary Care Provider: Renold DonWALDEN,JEFF, MD Consultants: ID, CCM Code Status: full  Pt Overview and Major Events to Date:  12/12- admitted to IM service with shortness of breath 12/15- transferred to FPTS 12/18 - BAL with CCM  Assessment and Plan: Antonieta IbaJean-Paul P Bourbon is a 45 y.o. male with history of AIDS last CD4 count in January 2016 of 60, history of TB, chronic anemia.  PCP pneumonia- CTA with multifocal pneumonia. Patient with AIDS, CD4 count of 20, VL 54K. Initially treated with CTX and azithromycin. Cultures from sputum negative for PCP, however ID strongly believes patient has PCP. Negative legionella, strep pneumonia antigen. First AFB smear on 12/13 negative. Culture pending. Flu, RVP and cryptococcal antigen negative. Weaned to RA this morning, sats 97% and no increased WOB after removing O2. Denies shortness of breath or chest pain.  -ID following, appreciate recommendations -Continue Bactrim 12/12>>01/03 (3 weeks) -BAL by CCM on 12/18 - plan to collect Aspergillus, cryptococcal antigen, Beta D glucan and acid fast culture -Prednisone 60mg  daily for 5/5, then  40 mg for 1/5 days, then 20 mg for 0/11 days, for a total of 21 days -OOB  HIV/AIDS- highly drug-resistant virus. WBC 3.8>6.1>5.1. CD4 20 VL 54K - Antiretrovirals restarted by ID - When discharge, can go home on Bactrim for PCP prophylaxis per ID - No recommendation for azithromycin for MAC while on ART per ID  PE- admitted with shortness of breath, small PE dx on CTA chest. DVT negative on LE dopplers. Initially treated with heparin, is being bridged to warfarin. Discussed interaction with warfarin/bactrim. No issues.  INR 1.72 03/02/2016.  -warfarin per pharmacy 02/25/2016>>. -Lovenox per pharmacy. -monitor respiratory  status  Hx of TB- hx of TB 15 years ago [place] No hemoptysis. Has had 10kilo weight loss. -on isolation precautions -quant gold negative -first AFB smear negative, AFB culture pending   Normocytic anemia- appears to be chronic. No signs of bleeding. Likely due to malnutrition. Baseline appears to be 12-13. -12.5 today -continue to monitor daily  Cachexia- patient reports 10 kilo weight loss over last several months.  -supplement diet with ensure -consider consult to nutrition  HypoNa and HyperK: Na 131 and K 5.2. Likely due to bactrim vs. SIADH -kayexalate -BMP in the morning  Epigastric pain: reports epigastric pain with medications. He is on steroid -Pantoprazole 40 mg daily  Constipation - had normal BM on 1216 -MiraLAX as needed  FEN/GI: regular diet  PPx: on full anticoagulation for PE  Disposition: home pending clinical improvement. Lives alone. Has sister who lives in [place].  Subjective:  Denies pain this morning, reports understanding BAL.   Objective: Temp:  [97.8 F (36.6 C)-98.2 F (36.8 C)] 97.8 F (36.6 C) (12/18 0552) Pulse Rate:  [75-81] 75 (12/18 0552) Resp:  [16-18] 16 (12/18 0552) BP: (92-111)/(60-70) 111/70 (12/18 0552) SpO2:  [97 %-99 %] 99 % (12/18 0552) Weight:  [115 lb 12.8 oz (52.5 kg)] 115 lb 12.8 oz (52.5 kg) (12/18 14780552) Physical Exam: General: cachectic appearing male laying in bed in NAD Cardiovascular: RRR no MRG Respiratory: no increased work of breathing, CTA bilaterally no increased work of breathing Abdomen: soft, non-tender, +BS Extremities: thin, no edema or cyanosis Neuro: Awake, alert and oriented appropriately, no gross neuro deficits Psych: Euthymic mood with congruent affect  Laboratory:  Recent Labs Lab  02/29/16 0347 03/01/16 0442 03/02/16 0344  WBC 5.1 4.8 3.7*  HGB 13.5 13.6 12.5*  HCT 39.7 39.7 37.3*  PLT 307 339 284    Recent Labs Lab 02/24/16 1858 02/25/16 0328 02/26/16 0450  02/29/16 0347 03/01/16 0442 03/02/16 0344  NA 135 136 138 132* 131* 133*  K 3.7 3.6 4.7 4.9 5.2* 4.9  CL 100* 103 106 97* 94* 97*  CO2 28 27 27 25 27 29   BUN 11 9 16  25* 33* 29*  CREATININE 1.01 0.86 0.99 1.05 1.21 1.02  CALCIUM 9.0 8.5* 9.3 10.1 10.3 9.4  PROT 8.8* 7.5 7.4  --   --   --   BILITOT 0.5 0.4 0.4  --   --   --   ALKPHOS 597* 485* 461*  --   --   --   ALT 10* 9* 9*  --   --   --   AST 27 22 21   --   --   --   GLUCOSE 138* 95 110* 98 91 107*    Imaging/Diagnostic Tests: No results found.  Garth BignessKathryn Zahira Brummond, MD 03/02/2016, 7:13 AM PGY-2 [facility] FPTS Intern pager: [phone], text pages welcome

## 2016-03-02 NOTE — Progress Notes (Signed)
Video bronchoscopy performed.  Intervention bronchial washing.   No complications noted.  Will continue to monitor. 

## 2016-03-02 NOTE — Telephone Encounter (Signed)
RN received a referral from Dr Ninetta LightsHatcher on Friday(12/15). Rn has planned a face to face visit with the patient for today to offer services. My focus for the patient will be a patient centered approach that reconnects him back to the clinic and on his antiviral medication consistently.  RN spent greater than 30 minutes reviewing the chart to prepare for my initial visit with [name]. Currently he is in Endo for a procedure so my planned visit with be for this afternoon.

## 2016-03-02 NOTE — Progress Notes (Signed)
Attempted to wean oxygen from patient. After 15 minutes oxygen saturations dropped to 82-85% on 1-2L. Patient placed on 3L; oxygen saturations maintained at 94-97%.

## 2016-03-02 NOTE — Progress Notes (Signed)
ANTICOAGULATION CONSULT NOTE  Pharmacy Consult for Lovenox/Warfarin Indication: pulmonary embolus  No Known Allergies  Patient Measurements: Height: 5\' 8"  (172.7 cm) Weight: 115 lb 12.8 oz (52.5 kg) (scale c) IBW/kg (Calculated) : 68.4   Vital Signs: Temp: 97.8 F (36.6 C) (12/18 0552) Temp Source: Oral (12/18 0552) BP: 111/70 (12/18 0552) Pulse Rate: 75 (12/18 0552)  Labs:  Recent Labs  02/29/16 0347 03/01/16 0442 03/02/16 0344  HGB 13.5 13.6 12.5*  HCT 39.7 39.7 37.3*  PLT 307 339 284  LABPROT 16.2* 17.6* 20.4*  INR 1.29 1.44 1.72  CREATININE 1.05 1.21 1.02    Estimated Creatinine Clearance: 68.6 mL/min (by C-G formula based on SCr of 1.02 mg/dL).   Medical History: Past Medical History:  Diagnosis Date  . Depression    "stress and depression for any man is common" (03/21/2014)  . Hepatitis    "I don't know what hepatitis I have"  . HIV disease ([name])   . TB (pulmonary tuberculosis)    previously treated according to refugee documentation      Assessment: 45 yo male with history of HIV with new onset PE. Patient was on heparin drip and has been transitioned to warfarin with enoxaparin bridge. Coumadin dose increased to 7.5mg  po on 12/17 -INR= 1.72 with trend up (noted on Bactrim D5). Anticipate that INR trend is due to 5mg  per day ant the effect of 7.5mg  has not yet been seen (onset of bactrim interaction is reported as 2-5 days)  Goal of Therapy:  INR 2-3 Monitor platelets by anticoagulation protocol: Yes   Plan:  Enoxaparin 55mg  (1mg /kg) subcutaneous every 12 hours as bridge until INR is therapeutic x 2 readings  Warfarin 5 mg PO x 1 tonight  Daily INR; SCr every 72 hours while on enoxaparin Patient will need close INR following upon discharge due to multiple drug interations  Harland Germanndrew Kale Rondeau, Pharm D 03/02/2016 8:56 AM

## 2016-03-02 NOTE — Op Note (Signed)
Name:  [name] MRN:  [id number] DOB:  02/24/1971  PROCEDURE NOTE  Procedure(s): Flexible bronchoscopy [phone]([id number]) Bronchial alveolar lavage [phone]([id number]) of the right lower lboe    Indications:  HIV/AIDS,  RLL pneumonia  Consent:  Procedure, benefits, risks and alternatives discussed.  Questions answered.  Consent obtained.  Anesthesia:  Moderate sedation  Procedure summary:  Appropriate equipment was assembled.  The patient was brought to the operating room and identified as [name].  Safety timeout was performed. The patient was placed supine on the operating table, airway established and moderate  administered by self   After the appropriate level of sedation was assured, flexible video bronchoscope was lubricated and inserted through the right nostril Total of 10 mL of 1% Lidocaine were administered through the bronchoscope to augment moderate sedation  Airway examination was performed bilaterally to subsegmental level on the right side..  Minimal clear secretions were noted, mucosa appeared normal and no endobronchial lesions were identified.  Bronchial alveolar lavage of the right lower  lobe was performed with 100 mL of normal saline and return of 50 mL of fluid that was dark greay and somewhat purulent, after which the bronchoscope was withdrawn. Left side could not be examined due to transient hypotension and hypoxemia  Endobronchial ultrasound video bronchoscope was then lubricated and inserted through the endotracheal tube. Surveillance of the mediastinal and and bilateral hilar lymph node stations was performed.  Pathologically enlarged lymph nodes were noted.  Post-procedure chest x-ray was not ordered.  Specimens sent: Bronchial alveolar lavage specimen of the RIGHT LOWER LOBE for cell count microbiology and cytology and virology and PJP evlaution  Complications:  No immediate complications were noted.    Estimated blood loss:  None  Impression 1.  Normal Right airway and trachea 2.Purulent RLL returns 3 LEft side could not be examined    Dr. Kalman ShanMurali Aubri Gathright, M.D., [facility] Pulmonary and Critical Care Medicine Staff Physician [facility] and Critical [facility] Pager: [phone], If no answer or between  15:00h - 7:00h: call [phone]  03/02/2016 11:47 AM

## 2016-03-03 ENCOUNTER — Other Ambulatory Visit: Payer: Self-pay | Admitting: Pharmacist Clinician (PhC)/ Clinical Pharmacy Specialist

## 2016-03-03 DIAGNOSIS — B59 Pneumocystosis: Secondary | ICD-10-CM

## 2016-03-03 LAB — BASIC METABOLIC PANEL
Anion gap: 9 (ref 5–15)
BUN: 26 mg/dL — AB (ref 6–20)
CALCIUM: 9.3 mg/dL (ref 8.9–10.3)
CO2: 24 mmol/L (ref 22–32)
CREATININE: 1.14 mg/dL (ref 0.61–1.24)
Chloride: 98 mmol/L — ABNORMAL LOW (ref 101–111)
GFR calc Af Amer: >60 mL/min (ref >60)
GFR calc non Af Amer: >60 mL/min (ref >60)
GLUCOSE: 69 mg/dL (ref 65–99)
Potassium: 4.9 mmol/L (ref 3.5–5.1)
Sodium: 131 mmol/L — ABNORMAL LOW (ref 135–145)

## 2016-03-03 LAB — ACID FAST SMEAR (AFB): ACID FAST SMEAR - AFSCU2: NEGATIVE

## 2016-03-03 LAB — LEGIONELLA, DFA (W/OUT CULTURE): LEGIONELLA PNEUMOPHILA DFA: NEGATIVE

## 2016-03-03 LAB — ACID FAST SMEAR (AFB, MYCOBACTERIA)

## 2016-03-03 LAB — CBC
HCT: 35.4 % — ABNORMAL LOW (ref 39.0–52.0)
Hemoglobin: 11.9 g/dL — ABNORMAL LOW (ref 13.0–17.0)
MCH: 32.1 pg (ref 26.0–34.0)
MCHC: 33.6 g/dL (ref 30.0–36.0)
MCV: 95.4 fL (ref 78.0–100.0)
PLATELETS: 288 10*3/uL (ref 150–400)
RBC: 3.71 MIL/uL — AB (ref 4.22–5.81)
RDW: 11.6 % (ref 11.5–15.5)
WBC: 3.1 10*3/uL — ABNORMAL LOW (ref 4.0–10.5)

## 2016-03-03 LAB — CMV DNA, QUANTITATIVE, PCR
CMV DNA Quant: POSITIVE IU/mL
Log10 CMV Qn DNA Pl: UNDETERMINED log10 IU/mL

## 2016-03-03 LAB — PROTIME-INR
INR: 1.9
PROTHROMBIN TIME: 22.1 s — AB (ref 11.4–15.2)

## 2016-03-03 LAB — PNEUMOCYSTIS JIROVECI SMEAR BY DFA: Pneumocystis jiroveci Ag: NEGATIVE

## 2016-03-03 MED ORDER — LAMIVUDINE-ZIDOVUDINE 150-300 MG PO TABS
1.0000 | ORAL_TABLET | Freq: Two times a day (BID) | ORAL | Status: DC
  Administered 2016-03-03: 1 via ORAL
  Filled 2016-03-03 (×2): qty 1

## 2016-03-03 MED ORDER — DOLUTEGRAVIR SODIUM 50 MG PO TABS
50.0000 mg | ORAL_TABLET | Freq: Every day | ORAL | Status: DC
  Administered 2016-03-04: 50 mg via ORAL
  Filled 2016-03-03: qty 1

## 2016-03-03 MED ORDER — DARUNAVIR-COBICISTAT 800-150 MG PO TABS: 1.0000 | ORAL_TABLET | Freq: Every day | ORAL | 6 refills | Status: DC

## 2016-03-03 MED ORDER — EMTRICITABINE-TENOFOVIR AF 200-25 MG PO TABS
1.0000 | ORAL_TABLET | Freq: Every day | ORAL | Status: DC
  Administered 2016-03-03 – 2016-03-04 (×2): 1 via ORAL
  Filled 2016-03-03 (×2): qty 1

## 2016-03-03 MED ORDER — EMTRICITABINE-TENOFOVIR AF 200-25 MG PO TABS: 1.0000 | ORAL_TABLET | Freq: Every day | ORAL | 0 refills | Status: DC

## 2016-03-03 MED ORDER — APIXABAN 5 MG PO TABS
5.0000 mg | ORAL_TABLET | Freq: Two times a day (BID) | ORAL | Status: DC
  Administered 2016-03-03 – 2016-03-04 (×2): 5 mg via ORAL
  Filled 2016-03-03 (×2): qty 1

## 2016-03-03 MED ORDER — BOOST / RESOURCE BREEZE PO LIQD
1.0000 | Freq: Two times a day (BID) | ORAL | Status: DC
  Administered 2016-03-03: 1 via ORAL

## 2016-03-03 MED ORDER — DOLUTEGRAVIR SODIUM 50 MG PO TABS: 50.0000 mg | ORAL_TABLET | Freq: Every day | ORAL | 6 refills | Status: DC

## 2016-03-03 MED ORDER — ENSURE ENLIVE PO LIQD
237.0000 mL | Freq: Two times a day (BID) | ORAL | Status: DC
  Administered 2016-03-03: 237 mL via ORAL

## 2016-03-03 MED ORDER — WARFARIN SODIUM 7.5 MG PO TABS: 7.5000 mg | ORAL_TABLET | Freq: Once | ORAL | Status: DC

## 2016-03-03 MED ORDER — LAMIVUDINE-ZIDOVUDINE 150-300 MG PO TABS: 1.0000 | ORAL_TABLET | Freq: Two times a day (BID) | ORAL | 6 refills | Status: DC

## 2016-03-03 MED ORDER — APIXABAN 2.5 MG PO TABS: 2.5000 mg | ORAL_TABLET | Freq: Two times a day (BID) | ORAL | Status: DC

## 2016-03-03 NOTE — Progress Notes (Signed)
[facility]

## 2016-03-03 NOTE — Progress Notes (Addendum)
ANTICOAGULATION CONSULT NOTE  Pharmacy Consult for apixaban Indication: pulmonary embolus  No Known Allergies  Patient Measurements: Height: 5\' 8"  (172.7 cm) Weight: 114 lb 11.2 oz (52 kg) (Scale C) IBW/kg (Calculated) : 68.4   Vital Signs: Temp: 98.8 F (37.1 C) (12/19 1200) Temp Source: Oral (12/19 1200) BP: 96/79 (12/19 1200) Pulse Rate: 80 (12/19 1200)  Labs:  Recent Labs  03/01/16 0442 03/02/16 0344 03/03/16 0514  HGB 13.6 12.5* 11.9*  HCT 39.7 37.3* 35.4*  PLT 339 284 288  LABPROT 17.6* 20.4* 22.1*  INR 1.44 1.72 1.90  CREATININE 1.21 1.02 1.14    Estimated Creatinine Clearance: 60.8 mL/min (by C-G formula based on SCr of 1.14 mg/dL).   Medical History: Past Medical History:  Diagnosis Date  . Depression    "stress and depression for any man is common" (03/21/2014)  . Hepatitis    "I don't know what hepatitis I have"  . HIV disease ([name])   . TB (pulmonary tuberculosis)    previously treated according to refugee documentation    HIV Genotype Composite Data Genotype Dates:   Mutations in [place] drug susceptibility RT Mutations K65R, D67G, M184V, T215I, K219E, A98G, L100I, K103NS, V108I, P225H  PI Mutations T74S  Integrase Mutations None   Interpretation of Genotype Data per [employer] HIV Database Nucleoside RTIs  lamivudine (3TC)  High-level resistance abacavir (ABC) High-level resistance zidovudine (AZT)  Low-level resistance stavudine (D4T)  High-level resistance didanosine (DDI)   High-level resistance emtricitabine (FTC)  High-level resistance tenofovir (TDF)  High-level resistance   Non-Nucleoside RTIs  efavirenz (EFV)  High-level resistance etravirine (ETR)  Intermediate resistance nevirapine (NVP) High-level resistance rilpivirine (RPV) High-level resistance   Protease Inhibitors  atazanavir/r (ATV/r)           Susceptible darunavir/r (DRV/r)           Susceptible fosamprenavir/r (FPV/r)           Susceptible indinavir/r  (IDV/r) Susceptible lopinavir/r (LPV/r)            Susceptible nelfinavir (NFV)  Low-level resistance saquinavir/r (SQV/r)           Susceptible tipranavir/r (TPV/r)           Susceptible   Integrase Inhibitors  Raltegravir Resistance No range found NOT PREDICTED  Elvitegravir Resistance No range found NOT PREDICTED  Dolutegravir Resistance No range found NOT PREDICTED      Assessment: 45 yo male with history of HIV with new onset PE. Patient was on heparin drip and has been transitioned to warfarin with enoxaparin bridge. He is a complicated pt with a hx resistance HIV virus with K65R mutation. He will require anticoag now due to PE. However, he also needs treatment for PCP with septra so this would be a major issue with the coumadin.   At the very least he should be on a PI regimen due to his resistance. Discussed the case with Dr. Daiva EvesVan Dam and [facility] about him. We much rather the use DOAC to avoid the interation but has to be use at a reduced dose due to the cobicistat in Prezcobix. Since he got lovenox this AM, we will start his apixaban around dinner time.   Goal of Therapy:   Monitor platelets by anticoagulation protocol: Yes   Plan:   Dc lovenox/coumadin Start apixaban 5mg  BID x 7days then 2.5mg  BID for his PE Cont Prezcobix 1 PO qday Cont Tivicay 50mg  PO qday Cont Combivir 1 PO BID [facility]  Ulyses SouthwardMinh Josephine Rudnick, PharmD, BCPS, AAHIVP, CPP Infectious Disease Pharmacist Pager: [phone] 03/03/2016 1:30 PM

## 2016-03-03 NOTE — Progress Notes (Addendum)
SATURATION QUALIFICATIONS: (This note is used to comply with regulatory documentation for home oxygen)  Patient Saturations on Room Air at Rest = 93%  Patient Saturations on Room Air while Ambulating = 88%  Patient Saturations on 2 Liters of oxygen while Ambulating = 96%  Please briefly explain why patient needs home oxygen: 

## 2016-03-03 NOTE — Progress Notes (Signed)
Family Medicine Teaching Service Daily Progress Note Intern Pager: [phone]  Patient name: [name] Medical record number: [id number] Date of birth: 10/25/1970 Age: 45 y.o. Gender: male  Primary Care Provider: Renold DonWALDEN,JEFF, MD Consultants: ID, CCM Code Status: full  Pt Overview and Major Events to Date:  12/12- admitted to IM service with shortness of breath 12/15- transferred to FPTS 12/18 - BAL with CCM  Assessment and Plan: Anthony IbaJean-Paul P Lassen is a 45 y.o. male with history of AIDS last CD4 count in January 2016 of 60, history of TB, chronic anemia.  PCP pneumonia- CTA with multifocal pneumonia. Patient with AIDS, CD4 count of 20, VL 54K. Initially treated with CTX and azithromycin. Cultures from sputum negative for PCP, however ID strongly believes patient has PCP. Negative legionella, strep pneumonia antigen. First AFB smear on 12/13 negative. Culture pending. Flu, RVP and cryptococcal antigen negative. Weaned to RA 12/18 with desaturation to low 80's, placed back on 3L O2. -ID following, appreciate recommendations -Continue Bactrim 12/12>>01/03 (3 weeks) -BAL by CCM on 12/18 with evidence of purulent material in lung. Sputum studies: Aspergillus, cryptococcal antigen, Beta D glucan and acid fast culture -Prednisone 60mg  daily for 5/5, then  40 mg for 2/5 days, then 20 mg for 0/11 days, for a total of 21 days -OOB, walk with O2 -may need oxygen at home  HIV/AIDS- highly drug-resistant virus. WBC 3.8>6.1>5.1. CD4 20 VL 54K - Antiretrovirals restarted by ID - When discharge, can go home on Bactrim for PCP prophylaxis per ID - No recommendation for azithromycin for MAC while on ART per ID  PE- admitted with shortness of breath, small PE dx on CTA chest. DVT negative on LE dopplers. Initially treated with heparin, is being bridged to warfarin. Discussed interaction with warfarin/bactrim. No issues. INR 1.72 03/02/2016.  -warfarin per pharmacy 02/25/2016>> -subtherapeutic  currently w/ INR 1.90 -Lovenox per pharmacy. -monitor respiratory status  Hx of TB- hx of [place] 15 years ago [place]. No hemoptysis. Has had 10 kilo weight loss. -on isolation precautions -quant gold negative -AFB smear negative, AFB culture pending   Normocytic anemia- appears to be chronic. No signs of bleeding. Likely due to malnutrition. Baseline appears to be 12-13. -11.9 today -continue to monitor daily  Cachexia- patient reports 10 kilo weight loss over last several months.  -supplement diet with ensure -consider consult to nutrition  HypoNa and HyperK: Na 131>133 and K 5.2>4.9. Likely due to bactrim vs. SIADH -kayexalate x1 -BMP in the morning  Epigastric pain: reports epigastric pain with medications. He is on steroid -Pantoprazole 40 mg daily  Constipation - had normal BM on 12/16 -MiraLAX as needed  FEN/GI: regular diet  PPx: on full anticoagulation for PE  Disposition: home pending clinical improvement. Lives alone. Has sister who lives in [place].  Subjective:  Feels well this morning. Would like to go home. Understands we need to make sure he is on proper antibiotics and see if he needs oxygen when he goes home. He also will need close follow up and help to ensure he can afford medications.  Objective: Temp:  [97.7 F (36.5 C)-98.2 F (36.8 C)] 98.2 F (36.8 C) (12/19 0527) Pulse Rate:  [65-113] 75 (12/19 0527) Resp:  [12-28] 18 (12/19 0527) BP: (71-117)/(42-68) 100/59 (12/19 0527) SpO2:  [76 %-100 %] 98 % (12/19 0527) Weight:  [114 lb 11.2 oz (52 kg)-115 lb (52.2 kg)] 114 lb 11.2 oz (52 kg) (12/19 0527) Physical Exam: General: cachectic appearing male laying in bed  in NAD Cardiovascular: RRR no MRG Respiratory: no increased work of breathing, CTA bilaterally no increased work of breathing Abdomen: soft, non-tender, +BS Extremities: thin, no edema or cyanosis Neuro: Awake, alert and oriented appropriately, no gross neuro  deficits  Laboratory:  Recent Labs Lab 03/01/16 0442 03/02/16 0344 03/03/16 0514  WBC 4.8 3.7* 3.1*  HGB 13.6 12.5* 11.9*  HCT 39.7 37.3* 35.4*  PLT 339 284 288    Recent Labs Lab 02/26/16 0450 02/29/16 0347 03/01/16 0442 03/02/16 0344  NA 138 132* 131* 133*  K 4.7 4.9 5.2* 4.9  CL 106 97* 94* 97*  CO2 27 25 27 29   BUN 16 25* 33* 29*  CREATININE 0.99 1.05 1.21 1.02  CALCIUM 9.3 10.1 10.3 9.4  PROT 7.4  --   --   --   BILITOT 0.4  --   --   --   ALKPHOS 461*  --   --   --   ALT 9*  --   --   --   AST 21  --   --   --   GLUCOSE 110* 98 91 107*    Imaging/Diagnostic Tests: No results found.  Tillman SersAngela C Vyolet Sakuma, DO 03/03/2016, 7:28 AM PGY-1 [facility] FPTS Intern pager: [phone], text pages welcome

## 2016-03-03 NOTE — Progress Notes (Signed)
   Ccm will be availble if needed. Signing off  Dr. Kalman ShanMurali Eragon Hammond, M.D., [facility]C.C.P Pulmonary and Critical Care Medicine Staff Physician [facility] Pulmonary and Critical Care Pager: [phone], If no answer or between  15:00h - 7:00h: call [phone]  03/03/2016 10:53 AM

## 2016-03-03 NOTE — Progress Notes (Signed)
Patient without complaints during 7 p to 7 a shift other than he wants to go home and he continues to have nausea.  Patient says his breathing is fine, I did discuss with patient the fact that he is still requiring oxygen and that this may be the case when he is discharged.  Encouraged patient to speak with providers when they round in the morning as to what things must take place for him to be discharged.

## 2016-03-03 NOTE — Progress Notes (Addendum)
Subjective:  "I want to go home"   Antibiotics:  Anti-infectives    Start     Dose/Rate Route Frequency Ordered Stop   03/04/16 1000  dolutegravir (TIVICAY) tablet 50 mg     50 mg Oral Daily 03/03/16 1233     03/03/16 1330  lamiVUDine-zidovudine (COMBIVIR) 150-300 MG per tablet 1 tablet     1 tablet Oral 2 times daily 03/03/16 1233     02/29/16 0800  darunavir-cobicistat (PREZCOBIX) 800-150 MG per tablet 1 tablet     1 tablet Oral Daily with breakfast 02/28/16 1931     02/27/16 2200  sulfamethoxazole-trimethoprim (BACTRIM DS,SEPTRA DS) 800-160 MG per tablet 2 tablet     2 tablet Oral Every 8 hours 02/27/16 1655     02/27/16 1445  sulfamethoxazole-trimethoprim (BACTRIM DS,SEPTRA DS) 800-160 MG per tablet 2 tablet  Status:  Discontinued     2 tablet Oral Every 8 hours 02/27/16 1431 02/27/16 1655   02/26/16 0700  darunavir-cobicistat (PREZCOBIX) 800-150 MG per tablet 1 tablet  Status:  Discontinued     1 tablet Oral Daily with breakfast 02/25/16 1453 02/28/16 1931   02/25/16 2200  cefTRIAXone (ROCEPHIN) 1 g in dextrose 5 % 50 mL IVPB  Status:  Discontinued     1 g 100 mL/hr over 30 Minutes Intravenous Every 24 hours 02/25/16 0255 02/25/16 1453   02/25/16 2200  azithromycin (ZITHROMAX) 500 mg in dextrose 5 % 250 mL IVPB  Status:  Discontinued     500 mg 250 mL/hr over 60 Minutes Intravenous Every 24 hours 02/25/16 0255 02/25/16 1453   02/25/16 1530  zidovudine (RETROVIR) capsule 100 mg  Status:  Discontinued     100 mg Oral Every 8 hours 02/25/16 1453 03/03/16 1233   02/25/16 1500  dolutegravir (TIVICAY) tablet 50 mg  Status:  Discontinued     50 mg Oral 2 times daily 02/25/16 1453 03/03/16 1233   02/25/16 0400  sulfamethoxazole-trimethoprim (BACTRIM) 320 mg in dextrose 5 % 500 mL IVPB  Status:  Discontinued     320 mg 346.7 mL/hr over 90 Minutes Intravenous Every 8 hours 02/25/16 0309 02/27/16 1431   02/24/16 2300  cefTRIAXone (ROCEPHIN) 1 g in dextrose 5 % 50 mL IVPB     1 g 100 mL/hr over 30 Minutes Intravenous  Once 02/24/16 2245 02/25/16 0035   02/24/16 2300  azithromycin (ZITHROMAX) 500 mg in dextrose 5 % 250 mL IVPB     500 mg 250 mL/hr over 60 Minutes Intravenous  Once 02/24/16 2245 02/25/16 2049      Medications: Scheduled Meds: . sodium chloride   Intravenous Once  . apixaban  5 mg Oral BID   Followed by  . [START ON 03/10/2016] apixaban  2.5 mg Oral BID  . darunavir-cobicistat  1 tablet Oral Q breakfast  . [START ON 03/04/2016] dolutegravir  50 mg Oral Daily  . feeding supplement  1 Container Oral BID PC  . feeding supplement (ENSURE ENLIVE)  237 mL Oral BID  . lamiVUDine-zidovudine  1 tablet Oral BID  . multivitamin with minerals  1 tablet Oral Daily  . pantoprazole  40 mg Oral Daily  . polyethylene glycol  17 g Oral Daily  . predniSONE  60 mg Oral Q breakfast  . sulfamethoxazole-trimethoprim  2 tablet Oral Q8H   Continuous Infusions: PRN Meds:.acetaminophen **OR** acetaminophen, ondansetron **OR** ondansetron (ZOFRAN) IV    Objective: Weight change: -12.8 oz (-0.363 kg)  Intake/Output Summary (Last 24  hours) at 03/03/16 1551 Last data filed at 03/03/16 1000  Gross per 24 hour  Intake                0 ml  Output             2000 ml  Net            -2000 ml   Blood pressure 96/79, pulse 80, temperature 98.8 F (37.1 C), temperature source Oral, resp. rate 18, height 5\' 8"  (1.727 m), weight 114 lb 11.2 oz (52 kg), SpO2 96 %. Temp:  [97.9 F (36.6 C)-98.8 F (37.1 C)] 98.8 F (37.1 C) (12/19 1200) Pulse Rate:  [75-80] 80 (12/19 1200) Resp:  [18] 18 (12/19 0527) BP: (91-100)/(56-79) 96/79 (12/19 1200) SpO2:  [96 %-100 %] 96 % (12/19 1200) Weight:  [114 lb 11.2 oz (52 kg)] 114 lb 11.2 oz (52 kg) (12/19 0527)  Physical Exam: General: Alert and awake, oriented x3, not in any acute distress, underweight HEENT: anicteric sclera, pupils reactive to light and accommodation, EOMI CVS regular rate, normal r,  no murmur rubs or  gallops Chest: prolonged exp phase, dec bs at the bases Abdomen: soft nontender, nondistended, normal bowel sounds, Extremities: no  clubbing or edema noted bilaterally Skin: no rashes Neuro: nonfocal  CBC:  CBC Latest Ref Rng & Units 03/03/2016 03/02/2016 03/01/2016  WBC 4.0 - 10.5 K/uL 3.1(L) 3.7(L) 4.8  Hemoglobin 13.0 - 17.0 g/dL 11.9(L) 12.5(L) 13.6  Hematocrit 39.0 - 52.0 % 35.4(L) 37.3(L) 39.7  Platelets 150 - 400 K/uL 288 284 339     BMET  Recent Labs  03/02/16 0344 03/03/16 0514  NA 133* 131*  K 4.9 4.9  CL 97* 98*  CO2 29 24  GLUCOSE 107* 69  BUN 29* 26*  CREATININE 1.02 1.14  CALCIUM 9.4 9.3     Liver Panel  No results for input(s): PROT, ALBUMIN, AST, ALT, ALKPHOS, BILITOT, BILIDIR, IBILI in the last 72 hours.     Sedimentation Rate No results for input(s): ESRSEDRATE in the last 72 hours. C-Reactive Protein No results for input(s): CRP in the last 72 hours.  Micro Results: Recent Results (from the past 720 hour(s))  Culture, blood (Routine x 2)     Status: None   Collection Time: 02/24/16  7:20 PM  Result Value Ref Range Status   Specimen Description BLOOD RIGHT ANTECUBITAL  Final   Special Requests IN PEDIATRIC BOTTLE 1CC  Final   Culture NO GROWTH 5 DAYS  Final   Report Status 02/29/2016 FINAL  Final  Culture, blood (Routine x 2)     Status: None   Collection Time: 02/24/16  7:28 PM  Result Value Ref Range Status   Specimen Description BLOOD LEFT ANTECUBITAL  Final   Special Requests BOTTLES DRAWN AEROBIC AND ANAEROBIC 5CC  Final   Culture NO GROWTH 5 DAYS  Final   Report Status 02/29/2016 FINAL  Final  Urine culture     Status: Abnormal   Collection Time: 02/25/16 12:39 AM  Result Value Ref Range Status   Specimen Description URINE, RANDOM  Final   Special Requests NONE  Final   Culture MULTIPLE SPECIES PRESENT, SUGGEST RECOLLECTION (A)  Final   Report Status 02/26/2016 FINAL  Final  Culture, sputum-assessment     Status: None    Collection Time: 02/25/16  2:54 AM  Result Value Ref Range Status   Specimen Description EXPECTORATED SPUTUM  Final   Special Requests NONE  Final  Sputum evaluation   Final    MICROSCOPIC FINDINGS SUGGEST THAT THIS SPECIMEN IS NOT REPRESENTATIVE OF LOWER RESPIRATORY SECRETIONS. PLEASE RECOLLECTIhor Austin RN 1610 02/26/16 A BROWNING    Report Status 02/26/2016 FINAL  Final  Pneumocystis smear by DFA     Status: None   Collection Time: 02/25/16  2:54 PM  Result Value Ref Range Status   Specimen Source-PJSRC SPUTUM  Final   Pneumocystis jiroveci Ag NEGATIVE  Final    Comment[facility]  Acid Fast Smear (AFB)     Status: None   Collection Time: 02/26/16  1:21 AM  Result Value Ref Range Status   AFB Specimen Processing Concentration  Final   Acid Fast Smear Negative  Final    Comment: (NOTE) Performed At: [facility] [address] [place], [place] [id number] Mila Homer MD JX:[id number]    Source (AFB) SPUTUM  Final  Respiratory Panel by PCR     Status: None   Collection Time: 02/28/16 10:42 AM  Result Value Ref Range Status   Adenovirus NOT DETECTED NOT DETECTED Final   Coronavirus 229E NOT DETECTED NOT DETECTED Final   Coronavirus HKU1 NOT DETECTED NOT DETECTED Final   Coronavirus NL63 NOT DETECTED NOT DETECTED Final   Coronavirus OC43 NOT DETECTED NOT DETECTED Final   Metapneumovirus NOT DETECTED NOT DETECTED Final   Rhinovirus / Enterovirus NOT DETECTED NOT DETECTED Final   Influenza A NOT DETECTED NOT DETECTED Final   Influenza B NOT DETECTED NOT DETECTED Final   Parainfluenza Virus 1 NOT DETECTED NOT DETECTED Final   Parainfluenza Virus 2 NOT DETECTED NOT DETECTED Final   Parainfluenza Virus 3 NOT DETECTED NOT DETECTED Final   Parainfluenza Virus 4 NOT DETECTED NOT DETECTED Final   Respiratory Syncytial Virus NOT DETECTED NOT DETECTED Final   Bordetella pertussis NOT DETECTED NOT DETECTED Final   Chlamydophila pneumoniae NOT  DETECTED NOT DETECTED Final   Mycoplasma pneumoniae NOT DETECTED NOT DETECTED Final  Culture, bal-quantitative     Status: None (Preliminary result)   Collection Time: 03/02/16  3:01 PM  Result Value Ref Range Status   Specimen Description BRONCHIAL ALVEOLAR LAVAGE  Final   Special Requests RLL  Final   Gram Stain   Final    FEW WBC PRESENT, PREDOMINANTLY PMN RARE GRAM POSITIVE COCCI IN PAIRS AND CHAINS RARE YEAST RARE GRAM NEGATIVE COCCOBACILLI RARE GRAM POSITIVE RODS    Culture CULTURE REINCUBATED FOR BETTER GROWTH  Final   Report Status PENDING  Incomplete  Acid Fast Smear (AFB)     Status: None   Collection Time: 03/02/16  3:04 PM  Result Value Ref Range Status   AFB Specimen Processing Concentration  Final   Acid Fast Smear Negative  Final    Comment: (NOTE) Performed At: [facility] [address] [place], [place] [id number] Mila Homer MD ON:[id number]    Source (AFB) BRONCHIAL ALVEOLAR LAVAGE  Final    Comment:  RLL  Pneumocystis smear by DFA     Status: None   Collection Time: 03/02/16  3:07 PM  Result Value Ref Range Status   Specimen Source-PJSRC BRONCHIAL ALVEOLAR LAVAGE  Corrected    Comment:  RLL   Pneumocystis jiroveci Ag NEGATIVE  Final    Comment: Performed at [facility]    Studies/Results: No results found.    Assessment/Plan:  INTERVAL HISTORY: sp bronch for AFB, PCP   Principal Problem:   Acute respiratory  failure with hypoxia ([name]) Active Problems:   Multifocal pneumonia   AIDS ([name])   PE (pulmonary thromboembolism) ([name])   Normochromic normocytic anemia   Pneumonia   Protein-calorie malnutrition, severe   Hypoxia    [name] is a 45 y.o. male with  HIV, AIDS, PE, possible PCP pneumonia  #1 PE: would change to apixaban low dose per Ulyses Southward recs for this to work with his COBI  #2 HIV/AIDS: I have told him if he can JUST commit to taking PREZCOBIX and TIVICAY each ONCE daily we should be able  to control his virus. Both the PI and the INSTI are FULLY active and should be sufficient. In addition the levels of DTG from Tivicay in setting of being given with COBI would boost to BID levels  We could use a 3rd drug such as AZT or Combivir to enhance the AZT efffect. But he would prefer the simplest possible regimen  I would like to go with PREZCOBIX and TIVICAY but also include Descovy which is a small pill  (he has mad promise to me to take the first 2, if is not perfectly adherent to the Descovy that is OK if the first 2 are being taken)  This regimen should get him through his out of pocket phase to help with cost  #3 PCP: he is going to need to finish a 21 day course of bactrim  and prednisone. He is nearly 10 days in  You can drop his prednisone to 20mg  daily for purposes of his PCP and continue this for another 11 days along with bactrim DS two TID for another 11 days then drop Bactrim to QD for prevention and dc steroid    #4 Rule out TB: I dont think he has TB and AFB smear negative x 2. If want to go by book he would need one more smear but my suspicion is low and I would be OK to DC airborne if team dont think they can get another since again I dont suspect TB highly  #5 OI prophylaxis: would just do PCP prophylaxis to simplify pill burden    Also Ulyses Southward is getting patients ARV drugs filled at [facility] so that they can be delivered to the patients bedside tomorrow.   He has appt in [facility] at [address] with Ulyses Southward for 11pm next Thursday December 28th  Please call with further questions.      LOS: 8 days   [name] 03/03/2016, 3:51 PM

## 2016-03-03 NOTE — Progress Notes (Signed)
Patient need oxygen for home; [facility] called, [facility]; portable 02 tank to be delivered to the room today prior to discharging home; Alexis GoodellB Nick Armel RN,MHA,BSN [phone]

## 2016-03-03 NOTE — Progress Notes (Signed)
ANTICOAGULATION CONSULT NOTE  Pharmacy Consult for Lovenox/Warfarin Indication: pulmonary embolus  No Known Allergies  Patient Measurements: Height: 5\' 8"  (172.7 cm) Weight: 114 lb 11.2 oz (52 kg) (Scale C) IBW/kg (Calculated) : 68.4   Vital Signs: Temp: 98.2 F (36.8 C) (12/19 0527) Temp Source: Oral (12/19 0527) BP: 100/59 (12/19 0527) Pulse Rate: 75 (12/19 0527)  Labs:  Recent Labs  03/01/16 0442 03/02/16 0344 03/03/16 0514  HGB 13.6 12.5* 11.9*  HCT 39.7 37.3* 35.4*  PLT 339 284 288  LABPROT 17.6* 20.4* 22.1*  INR 1.44 1.72 1.90  CREATININE 1.21 1.02 1.14    Estimated Creatinine Clearance: 60.8 mL/min (by C-G formula based on SCr of 1.14 mg/dL).   Medical History: Past Medical History:  Diagnosis Date  . Depression    "stress and depression for any man is common" (03/21/2014)  . Hepatitis    "I don't know what hepatitis I have"  . HIV disease ([name])   . TB (pulmonary tuberculosis)    previously treated according to refugee documentation      Assessment: 45 yo male with history of HIV with new onset PE. Patient was on heparin drip and has been transitioned to warfarin with enoxaparin bridge. INR with trend up to 1.9 (noted on Bactrim)  Goal of Therapy:  INR 2-3 Monitor platelets by anticoagulation protocol: Yes   Plan:  Enoxaparin 55mg  (1mg /kg) subcutaneous every 12 hours as bridge until INR is therapeutic Warfarin 7.5 mg PO x 1 tonight  Daily INR; SCr every 72 hours while on enoxaparin Patient will need close INR following upon discharge due to multiple drug interations  Harland Germanndrew Deniece Rankin, Pharm D 03/03/2016 8:41 AM

## 2016-03-03 NOTE — Progress Notes (Signed)
Nutrition Follow-up  DOCUMENTATION CODES:   Severe malnutrition in context of acute illness/injury, Underweight  INTERVENTION:  Continue Ensure Enlive po BID, each supplement provides 350 kcal and 20 grams of protein Provide Boost Breeze po BID, each supplement provides 250 kcal and 9 grams of protein Provide snacks BID Multivitamin with minerals   NUTRITION DIAGNOSIS:   Malnutrition related to acute illness, poor appetite as evidenced by percent weight loss, severe depletion of body fat, severe depletion of muscle mass.  ongoing  GOAL:   Patient will meet greater than or equal to 90% of their needs  unmet  MONITOR:   PO intake, Supplement acceptance, Labs, Skin, I & O's  REASON FOR ASSESSMENT:   Consult  (cachexia)  ASSESSMENT:   45 y.o. male with history of AIDS last CD4 count in our system in January 2016 was 60 presents to the ER because of worsening shortness of breath. Patient states over the last 1 month, patient has been having increasing shortness of breath even at rest. Shortness of breath associated with pleuritic type of chest pain and productive cough. Denies any hemoptysis. Denies any fever or chills but has been losing weight. CT scan of the chest shows pulmonary embolism with multifocal pneumonia COPD and bronchiectasis  RD consulted for patient for cachexia. Initial assessment done 12/12. Per nursing notes, pt is eating 50-100% of meals. Pt was NPO on 12/18 AM and diet was advanced to full liquids in the afternoon. RN reports that patient ate breakfast well, but declined Ensure supplement. Per RN, pt has been complaining of nausea which has been relived by Zofran. Pt continues to lose weight with an additional 5 lbs wt loss in the past week.  RD to provide alternative supplement and snacks for patient.  Labs: low sodium, low chloride, low hemoglobin  Diet Order:  Diet full liquid Room service appropriate? Yes; Fluid consistency: Thin  Skin:  Reviewed, no  issues  Last BM:  12/16  Height:   Ht Readings from Last 1 Encounters:  03/02/16 5\' 8"  (1.727 m)    Weight:   Wt Readings from Last 1 Encounters:  03/03/16 114 lb 11.2 oz (52 kg)    Ideal Body Weight:  70 kg  BMI:  Body mass index is 17.44 kg/m.  Estimated Nutritional Needs:   Kcal:  1900-2150  Protein:  75-90 grams  Fluid:  1.9 L/day  EDUCATION NEEDS:   No education needs identified at this time  Dorothea Ogleeanne Jaggar Benko RD, CSP, LDN Inpatient Clinical Dietitian Pager: [phone] After Hours Pager: [phone]

## 2016-03-04 ENCOUNTER — Encounter (HOSPITAL_COMMUNITY): Payer: Self-pay | Admitting: Pulmonary Disease

## 2016-03-04 ENCOUNTER — Other Ambulatory Visit: Payer: Self-pay | Admitting: Pharmacist Clinician (PhC)/ Clinical Pharmacy Specialist

## 2016-03-04 LAB — BASIC METABOLIC PANEL
ANION GAP: 7 (ref 5–15)
BUN: 35 mg/dL — ABNORMAL HIGH (ref 6–20)
CALCIUM: 9.6 mg/dL (ref 8.9–10.3)
CO2: 27 mmol/L (ref 22–32)
CREATININE: 1.44 mg/dL — AB (ref 0.61–1.24)
Chloride: 97 mmol/L — ABNORMAL LOW (ref 101–111)
GFR, EST NON AFRICAN AMERICAN: 58 mL/min — AB (ref >60)
Glucose, Bld: 100 mg/dL — ABNORMAL HIGH (ref 65–99)
Potassium: 4.9 mmol/L (ref 3.5–5.1)
Sodium: 131 mmol/L — ABNORMAL LOW (ref 135–145)

## 2016-03-04 LAB — CBC
HCT: 38.8 % — ABNORMAL LOW (ref 39.0–52.0)
HEMOGLOBIN: 13.2 g/dL (ref 13.0–17.0)
MCH: 32.1 pg (ref 26.0–34.0)
MCHC: 34 g/dL (ref 30.0–36.0)
MCV: 94.4 fL (ref 78.0–100.0)
Platelets: 270 10*3/uL (ref 150–400)
RBC: 4.11 MIL/uL — AB (ref 4.22–5.81)
RDW: 11.4 % — ABNORMAL LOW (ref 11.5–15.5)
WBC: 3.4 10*3/uL — AB (ref 4.0–10.5)

## 2016-03-04 LAB — RESPIRATORY VIRUS PANEL
ADENOVIRUS: NEGATIVE
INFLUENZA A: NEGATIVE
Influenza B: NEGATIVE
Metapneumovirus: NEGATIVE
PARAINFLUENZA 2 A: NEGATIVE
Parainfluenza 1: NEGATIVE
Parainfluenza 3: NEGATIVE
RESPIRATORY SYNCYTIAL VIRUS A: NEGATIVE
RESPIRATORY SYNCYTIAL VIRUS B: NEGATIVE
RHINOVIRUS: NEGATIVE

## 2016-03-04 LAB — ASPERGILLUS ANTIGEN, BAL/SERUM: Aspergillus Ag, BAL/Serum: 0.05 Index (ref 0.00–0.49)

## 2016-03-04 LAB — PROTIME-INR
INR: 1.6
PROTHROMBIN TIME: 19.2 s — AB (ref 11.4–15.2)

## 2016-03-04 LAB — CULTURE, BAL-QUANTITATIVE

## 2016-03-04 LAB — CULTURE, BAL-QUANTITATIVE W GRAM STAIN: Culture: >=100000 — AB

## 2016-03-04 MED ORDER — SULFAMETHOXAZOLE-TRIMETHOPRIM 800-160 MG PO TABS: 1.0000 | ORAL_TABLET | Freq: Two times a day (BID) | ORAL | 0 refills | Status: DC

## 2016-03-04 MED ORDER — SULFAMETHOXAZOLE-TRIMETHOPRIM 800-160 MG PO TABS: 1.0000 | ORAL_TABLET | Freq: Every day | ORAL | 6 refills | Status: DC

## 2016-03-04 MED ORDER — SULFAMETHOXAZOLE-TRIMETHOPRIM 800-160 MG PO TABS: 1.0000 | ORAL_TABLET | Freq: Three times a day (TID) | ORAL | 0 refills | Status: DC

## 2016-03-04 MED ORDER — APIXABAN 2.5 MG PO TABS: 2.5000 mg | ORAL_TABLET | Freq: Two times a day (BID) | ORAL | 0 refills | Status: DC

## 2016-03-04 MED ORDER — APIXABAN 5 MG PO TABS
5.0000 mg | ORAL_TABLET | Freq: Two times a day (BID) | ORAL | 0 refills | Status: DC
Start: 2016-03-04 — End: 2016-03-18

## 2016-03-04 MED ORDER — SULFAMETHOXAZOLE-TRIMETHOPRIM 800-160 MG PO TABS: 2.0000 | ORAL_TABLET | Freq: Three times a day (TID) | ORAL | 0 refills | Status: DC

## 2016-03-04 MED ORDER — PREDNISONE 20 MG PO TABS: 40.0000 mg | ORAL_TABLET | Freq: Every day | ORAL | 0 refills | Status: AC

## 2016-03-04 MED ORDER — AZITHROMYCIN 600 MG PO TABS: 1200.0000 mg | ORAL_TABLET | ORAL | 5 refills | Status: DC

## 2016-03-04 NOTE — Discharge Summary (Signed)
Family Medicine [facility] Discharge Summary  Patient name: [name] Medical record number: [id number] Date of birth: 01/30/1971 Age: 45 y.o. Gender: male Date of Admission: 02/24/2016  Date of Discharge: 03/04/16  Admitting Physician: Eduard ClosArshad N Kakrakandy, MD  Primary Care Provider: Renold DonWALDEN,JEFF, MD Consultants: ID, CCM  Indication for Hospitalization: respiratory distress  Discharge Diagnoses/Problem List:  AIDS with CD4 count of 20 PCP pneumonia Hx of tuberculosis Constipation  Disposition: home  Discharge Condition: stable  Discharge Exam: see progress note from day of discharge  Brief Hospital Course:   [name] is a 45 year old with PMH of HIV, AIDS, remote hx of TB, anemia, malnutrition, who [facility]. He was found to have pneumonia and acute PE. He was started on anticoagulation for PE and antibiotics for pneumonia. ID was consulted and restarted the patient on antiretroviral medications. They initiated treatment for PCP pneumonia. CCM was consulted to perform a BAL which was done on 12/18 and demonstrated purulent material in the lungs. Sputum cultures were collected. Patient was transitioned to Eliquis for PE treatment. He will be treated with Bactrim for PCP pneumonia for a total of 3 weeks. He required 3L O2 during his hospitalization in the setting of acute pneumonia and PE and was set up with home oxygen. He was provided with antiretroviral medications prior to discharge. Eliquis [facility]. He has close follow up with his PCP office and Infectious Disease. He was discharged home in stable improved condition on 12/20.   Issues for Follow Up:  1. Follow up completion of course of bactrim for PCP pneumonia, end date 03/18/16 2. Follow up continuation of Eliquis for acute PE 3. Follow up on compliance with antiretrovirals and ensure patient is set  up to see ID 4. Follow up home O2 needs  Significant Procedures: BAL on 12/18 by CCM  Significant Labs and Imaging:   Recent Labs Lab 03/02/16 0344 03/03/16 0514 03/04/16 0452  WBC 3.7* 3.1* 3.4*  HGB 12.5* 11.9* 13.2  HCT 37.3* 35.4* 38.8*  PLT 284 288 270    Recent Labs Lab 02/29/16 0347 03/01/16 0442 03/02/16 0344 03/03/16 0514 03/04/16 0452  NA 132* 131* 133* 131* 131*  K 4.9 5.2* 4.9 4.9 4.9  CL 97* 94* 97* 98* 97*  CO2 25 27 29 24 27   GLUCOSE 98 91 107* 69 100*  BUN 25* 33* 29* 26* 35*  CREATININE 1.05 1.21 1.02 1.14 1.44*  CALCIUM 10.1 10.3 9.4 9.3 9.6   Quantiferon gold- negative AFB smear negative x 2 RVP negative Aspergillus Ag negative Legionella negative Pneumocystis smear negative from BAL BAL culture 100,000 colonies consistent with normal respiratory flora   Results/Tests Pending at Time of Discharge: final Acid fast sputum culture, CMV dna PCR  Discharge Medications:  Allergies as of 03/04/2016   No Known Allergies     Medication List    TAKE these medications   apixaban 5 MG Tabs tablet Commonly known as:  ELIQUIS Take 1 tablet (5 mg total) by mouth 2 (two) times daily.   apixaban 2.5 MG Tabs tablet Commonly known as:  ELIQUIS Take 1 tablet (2.5 mg total) by mouth 2 (two) times daily.   darunavir-cobicistat 800-150 MG tablet Commonly known as:  PREZCOBIX Take 1 tablet by mouth daily with breakfast. Swallow whole. Do NOT crush, break or chew tablets. Take with food.   dolutegravir 50 MG tablet Commonly known as:  TIVICAY Take 1 tablet (50 mg total) by mouth daily.   emtricitabine-tenofovir AF 200-25 MG tablet Commonly known as:  DESCOVY Take 1 tablet by mouth daily.   guaiFENesin-dextromethorphan 100-10 MG/5ML syrup Commonly known as:  ROBITUSSIN DM Take 5 mLs by mouth every 4 (four) hours as needed for cough.   methocarbamol 500 MG tablet Commonly known as:  ROBAXIN Take 1 tablet (500 mg total) by mouth every 6 (six) hours  as needed for muscle spasms.   ondansetron 4 MG disintegrating tablet Commonly known as:  ZOFRAN ODT Take 1 tablet (4 mg total) by mouth every 8 (eight) hours as needed.   oxyCODONE-acetaminophen 10-325 MG tablet Commonly known as:  PERCOCET Take 1 tablet by mouth every 6 (six) hours as needed.   predniSONE 20 MG tablet Commonly known as:  DELTASONE Take 2 tablets (40 mg total) by mouth daily with breakfast. Take 2 tablets for 2 days Take 1 tablet for 11 days Start taking on:  03/05/2016   sulfamethoxazole-trimethoprim 800-160 MG tablet Commonly known as:  BACTRIM DS,SEPTRA DS Take 1 tablet by mouth daily.   sulfamethoxazole-trimethoprim 800-160 MG tablet Commonly known as:  BACTRIM DS,SEPTRA DS Take 2 tablets by mouth 3 (three) times daily.            Durable Medical Equipment        Start     Ordered   03/03/16 1125  For home use only DME oxygen  Once    Question Answer Comment  Mode or (Route) Nasal cannula   Liters per Minute 3   Frequency Continuous (stationary and portable oxygen unit needed)   Oxygen delivery system Gas      03/03/16 1125      Discharge Instructions: Please refer to Patient Instructions section of EMR for full details.  Patient was counseled important signs and symptoms that should prompt return to medical care, changes in medications, dietary instructions, activity restrictions, and follow up appointments.   Follow-Up Appointments: Follow-up Information    Tillman Sersngela C Riccio, DO. Go on 03/18/2016.   Why:  at 2 pm Contact information: [address] [place] [address] [phone]        Acey Lavornelius Van Dam, MD. Go on 03/12/2016.   Specialty:  Infectious Diseases Why:  at 11 am to see pharmacist Contact information: [address] [place] [address] [phone]           Tillman Sersngela C Riccio, DO 03/04/2016, 10:44 AM PGY-1, [facility]

## 2016-03-04 NOTE — Progress Notes (Signed)
All d/c instructions explained and given to pt and family at bedside named [name] who pt asked to interpret d/c instructions.  Pt verbalized understanding.  Also requesting for excuse from work after d/c MD notified.  Amanda PeaNellie Daymien Goth, Charity fundraiserN.

## 2016-03-04 NOTE — Progress Notes (Signed)
Eliquis coupon card given to patientAlexis Parsons; B Eira Alpert RN,MHA,BSN [phone]

## 2016-03-04 NOTE — Progress Notes (Unsigned)
[facility]

## 2016-03-04 NOTE — Progress Notes (Signed)
Family Medicine Teaching Service Daily Progress Note Intern Pager: [phone]  Patient name: [name] Kun Medical record number: [id number] Date of birth: 09/02/1970 Age: 45 y.o. Gender: male  Primary Care Provider: Renold DonWALDEN,JEFF, MD Consultants: ID, CCM Code Status: full  Pt Overview and Major Events to Date:  12/12- admitted to IM service with shortness of breath 12/15- transferred to FPTS 12/18 - BAL with CCM  Assessment and Plan: [name] Wysong is a 45 y.o. male with history of AIDS last CD4 count in January 2016 of 60, history of TB, chronic anemia.  PCP pneumonia- CTA with multifocal pneumonia. Patient with AIDS, CD4 count of 20, VL 54K. Initially treated with CTX and azithromycin. Cultures from sputum negative for PCP, however ID strongly believes patient has PCP. Negative legionella, strep pneumonia antigen. First AFB smear on 12/13 negative. Culture pending. Flu, RVP and cryptococcal antigen negative. Weaned to RA 12/18 with desaturation to low 80's, placed back on 3L O2. -ID following, appreciate recommendations -Continue Bactrim 12/12>>01/03 (3 weeks) -BAL by CCM on 12/18 with evidence of purulent material in lung -Prednisone 60mg  daily for 5/5, then  40 mg for 3/5 days, then 20 mg for 0/11 days, for a total of 21 days -OOB, walk with O2 - home oxygen ordered  HIV/AIDS- highly drug-resistant virus. WBC 3.8>6.1>5.1. CD4 20 VL 54K - Antiretrovirals restarted by ID, prescriptions provided to him today - When discharge, can go home on Bactrim for PCP prophylaxis per ID  PE- admitted with shortness of breath, small PE dx on CTA chest. DVT negative on LE dopplers. Initially treated with heparin, is being bridged to warfarin. Discussed interaction with warfarin/bactrim. No issues. INR 1.72 03/02/2016.  -switched to eliquis for anticoagulation, per ID recommendations  Normocytic anemia- appears to be chronic. No signs of bleeding. Likely due to malnutrition. Baseline  appears to be 12-13. -13.2 today -continue to monitor daily  Epigastric pain: reports epigastric pain with medications. He is on steroid -Pantoprazole 40 mg daily  Constipation - had normal BM on 12/16 -MiraLAX as needed  FEN/GI: regular diet  PPx: on full anticoagulation for PE  Disposition: home today  Subjective:  Feels well, is ready to go home. He has oxygen tank and antiretrovirals. He will follow up with ID and FM.  Objective: Temp:  [98.2 F (36.8 C)-100.2 F (37.9 C)] 100.2 F (37.9 C) (12/19 2021) Pulse Rate:  [75-86] 86 (12/19 2021) Resp:  [18] 18 (12/19 0527) BP: (91-100)/(56-79) 100/60 (12/19 2021) SpO2:  [93 %-98 %] 93 % (12/19 2021) Weight:  [114 lb 11.2 oz (52 kg)] 114 lb 11.2 oz (52 kg) (12/19 0527) Physical Exam: General: cachectic appearing male laying in bed in NAD Cardiovascular: RRR no MRG Respiratory: no increased work of breathing, CTA bilaterally no increased work of breathing Abdomen: soft, non-tender, +BS Extremities: thin, no edema or cyanosis Neuro: Awake, alert and oriented appropriately, no gross neuro deficits  Laboratory:  Recent Labs Lab 03/01/16 0442 03/02/16 0344 03/03/16 0514  WBC 4.8 3.7* 3.1*  HGB 13.6 12.5* 11.9*  HCT 39.7 37.3* 35.4*  PLT 339 284 288    Recent Labs Lab 02/26/16 0450  03/01/16 0442 03/02/16 0344 03/03/16 0514  NA 138  < > 131* 133* 131*  K 4.7  < > 5.2* 4.9 4.9  CL 106  < > 94* 97* 98*  CO2 27  < > 27 29 24   BUN 16  < > 33* 29* 26*  CREATININE 0.99  < > 1.21 1.02 1.14  CALCIUM 9.3  < > 10.3 9.4 9.3  PROT 7.4  --   --   --   --   BILITOT 0.4  --   --   --   --   ALKPHOS 461*  --   --   --   --   ALT 9*  --   --   --   --   AST 21  --   --   --   --   GLUCOSE 110*  < > 91 107* 69  < > = values in this interval not displayed.  Imaging/Diagnostic Tests: No results found.  Tillman SersAngela C Glorie Dowlen, DO 03/04/2016, 4:26 AM PGY-1 [facility] FPTS Intern pager: [phone], text pages  welcome

## 2016-03-04 NOTE — Progress Notes (Signed)
Pt d/c  Off  Floor to awaiting transport at 1630.  Amanda PeaNellie Nikolette Reindl, Charity fundraiserN.

## 2016-03-06 ENCOUNTER — Telehealth: Payer: Self-pay | Admitting: *Deleted

## 2016-03-06 LAB — CMV DNA BY PCR, QUALITATIVE: CMV DNA QL PCR: POSITIVE — AB

## 2016-03-06 NOTE — Telephone Encounter (Addendum)
[facility] the patient received his Eliquis and Bactim. Spoke with the pharm tech who stated inh picked up the Eliquis and Bactim and patient received education through an interpreter on the medications and how to take them.

## 2016-03-08 LAB — MTB NAA WITHOUT AFB CULTURE: SOURCE: NEGATIVE

## 2016-03-10 LAB — LEGIONELLA CULTURE REFLEX

## 2016-03-10 LAB — LEGIONELLA PNEUMOPHILA/CULTURE: Legionella Pneumophila DFA: NEGATIVE

## 2016-03-11 ENCOUNTER — Telehealth: Payer: Self-pay | Admitting: *Deleted

## 2016-03-11 NOTE — Telephone Encounter (Addendum)
RN contacted [employer] services for RadioShack[employer]ahili translation.   RN would like to follow up on the patient;s recent hospitalization and offer a home visit. My focus is to ensure that the patient has all of his medications in his home, that he appears to be taking the medications and to confirm that he has transportation and plans for keeping his follow up visit with Dr Drue SecondSnider.   Interpreter contacted the patient at the number he provided me with during my hospital visit but did not receive answer after 2 attempts. Voicemail has not been set up either. Patient may be at work so I will try to contact him again after 5pm.  RN noted that the patient has a scheduled visit with our pharmacist for tomorrow at 11am. I will plan to meet the patient at that time so we can connect and plan a date and time for the home visits

## 2016-03-12 ENCOUNTER — Ambulatory Visit: Payer: Self-pay | Admitting: *Deleted

## 2016-03-12 ENCOUNTER — Ambulatory Visit (INDEPENDENT_AMBULATORY_CARE_PROVIDER_SITE_OTHER): Payer: Commercial Managed Care - PPO | Admitting: Pharmacist Clinician (PhC)/ Clinical Pharmacy Specialist

## 2016-03-12 DIAGNOSIS — B20 Human immunodeficiency virus [HIV] disease: Secondary | ICD-10-CM

## 2016-03-12 DIAGNOSIS — I2699 Other pulmonary embolism without acute cor pulmonale: Secondary | ICD-10-CM

## 2016-03-12 LAB — BASIC METABOLIC PANEL
BUN: 23 mg/dL (ref 7–25)
CHLORIDE: 100 mmol/L (ref 98–110)
CO2: 26 mmol/L (ref 20–31)
Calcium: 9.3 mg/dL (ref 8.6–10.3)
Creat: 1.18 mg/dL (ref 0.60–1.35)
GLUCOSE: 83 mg/dL (ref 65–99)
Potassium: 5 mmol/L (ref 3.5–5.3)
SODIUM: 135 mmol/L (ref 135–146)

## 2016-03-12 MED ORDER — LORATADINE 10 MG PO TABS: 10.0000 mg | ORAL_TABLET | Freq: Every day | ORAL | 6 refills | Status: DC

## 2016-03-12 MED ORDER — ONDANSETRON HCL 4 MG PO TABS: 4.0000 mg | ORAL_TABLET | Freq: Three times a day (TID) | ORAL | 3 refills | Status: DC | PRN

## 2016-03-12 NOTE — Progress Notes (Signed)
HPI: [name] is a 45 y.o. male who is here for a f/u with pharmacy today after being dc from the hospital.   Allergies: No Known Allergies  Vitals:    Past Medical History: Past Medical History:  Diagnosis Date  . Depression    "stress and depression for any man is common" (03/21/2014)  . Hepatitis    "I don't know what hepatitis I have"  . HIV disease ([name])   . TB (pulmonary tuberculosis)    previously treated according to refugee documentation    Social History: Social History   Social History  . Marital status: Single    Spouse name: N/A  . Number of children: N/A  . Years of education: N/A   Social History Main Topics  . Smoking status: Former Smoker    Packs/day: 0.50    Years: 6.00    Types: Cigarettes    Quit date: 03/16/2001  . Smokeless tobacco: Never Used     Comment: "quit smoking ~ 2003"  . Alcohol use Yes     Comment: drinks bottled beer intermittently  . Drug use: No  . Sexual activity: Not Currently    Partners: Female   Other Topics Concern  . Not on file   Social History Narrative   As of 09/18/2013:   -Arrived in [place]: [place] 05, [place]    -[place] [facility], spent [place] years in [place]    -Language: [name], requires Architectwahili intepreter (speaks essentially no [place])   -Education: no formal education, previously worked as a Product/process development scientistdriver   -Contact: family phone number [phone], caseworker is Lannette DonathFa Ri Dar Bi [phone] (with [employer])   -lives with daughter and three sons   -denies alcohol, drug, tobacco abuse      [facility] (02/28/16):   Patient now admitted to drinking bottled beer. Reports continued tobacco abstinence. Denies any bird or mold exposure. No pets currently. No recent travel.    Previous Regimen:   Current Regimen: Prezcobix/DTG  Labs: HIV 1 RNA Quant (copies/mL)  Date Value  02/25/2016 54,000  04/19/2014 69 (H)  03/22/2014 4,634 (H)   CD4 T Cell Abs (/uL)  Date Value  02/25/2016  20 (L)  03/22/2014 60 (L)  10/26/2013 68 (L)   Hepatitis B Surface Ag (no units)  Date Value  09/26/2013 NEGATIVE   HCV Ab (no units)  Date Value  03/22/2014 NEGATIVE    CrCl: Estimated Creatinine Clearance: 47.5 mL/min (by C-G formula based on SCr of 1.44 mg/dL (H)).  Lipids:    Component Value Date/Time   CHOL 121 03/23/2014 0603   TRIG 27 03/23/2014 0603   HDL 37 (L) 03/23/2014 0603   CHOLHDL 3.3 03/23/2014 0603   VLDL 5 03/23/2014 0603   LDLCALC 79 03/23/2014 0603   HIV Genotype Composite Data Genotype Dates:   Mutations in Bold impact drug susceptibility RT Mutations K65R, D67G, M184V, T215I, K219E, A98G, L100I, K103NS, V108I, P225H  PI Mutations T74S  Integrase Mutations None   Interpretation of Genotype Data per [employer] HIV Database  Nucleoside RTIs  lamivudine (3TC)High-level resistance abacavir (ABC)High-level resistance zidovudine (AZT)Low-level resistance stavudine (D4T)High-level resistance didanosine (DDI)High-level resistance emtricitabine (FTC)High-level resistance tenofovir (TDF)High-level resistance   Non-Nucleoside RTIs  efavirenz (EFV)High-level resistance etravirine (ETR)Intermediate resistance nevirapine (NVP)High-level resistance rilpivirine (RPV)High-level resistance   Protease Inhibitors  atazanavir/r (ATV/r)Susceptible darunavir/r (DRV/r)Susceptible fosamprenavir/r (FPV/r)Susceptible indinavir/r (IDV/r)Susceptible lopinavir/r (LPV/r)Susceptible nelfinavir (NFV)Low-level resistance saquinavir/r (SQV/r)Susceptible tipranavir/r (TPV/r)Susceptible   Integrase Inhibitors  Raltegravir Resistance No range found NOT  PREDICTED  Elvitegravir Resistance No range found NOT PREDICTED  Dolutegravir Resistance No range found NOT PREDICTED      Assessment: [name] fell out of care with us since 2/16. He was recently admitted to the hospital for an  acute PE. While there, he was also being active treated for suspected PCP PNA with septra. Due to the major interaction with Coumadin, we decided to use half dose apixaban for him instead so we can cont to treat his PCP with septra. He is here with the interpreter (swahili).  Obviously, he has not been taking any ART during this time, therefore, his VL is 54k and a CD4 of 20. He has had major resistant to ARTs already so Dr. Daiva EvesVan Dam placed him on Tivicay and Prezcobix. He would only agreed to dual therapy only. He was able to point out all of the correct tablets while he is here and admitted to taking them. [name] Advertising copywriter(resource nurse) sat in with me because she will be helping with his care. She is going to bring him to the next [facility] who [facility] his [facility]. He promised to be adherent to his meds. He complained of some rash on his face and nausea with taking all of his meds. Going to try some claritin in him and give him some PRN zofran for nausea. Molli Poseymbre will pick it up for him today.   Dr. Wonda Oldsiccio - he was only given 30 days supply of Apixaban through the voucher so he will need more supply at the next visit with you. Eliquis will required prior authorization. Eliquis also has a copay card program so it would not be an issue if approved. [facility]. Made his a medication calendar with all of his meds on there.   He is going to finish his septra for PCP on 1/3 then he'll be on 1 tab daily for prophylaxis. It's on his med calendar. His scr had a little bump at dc so I'm going check a bmet today. High dose septra could do it.  (Meds were delivered to him at discharge)   Recommendations:  Bmet today Cont Tivicay and Prezcobix Cont Apixaban 2.5mg  PO BID Cont treatment dose septra until 1/3 then 1 PO qday Cont Azith 1200mg  qwk F/u with Dr. Wonda Oldsiccio 1/3 Molli PoseyAmbre will make a f/u appt with Dr. Drue SecondSnider Zofran 4mg  PO q8 PRN nausea Claritin 10mg  qday  Ulyses SouthwardMinh  Keydi Giel, PharmD, BCPS, AAHIVP, CPP Clinical Infectious Disease Pharmacist [facility] 03/12/2016, 2:15 PM

## 2016-03-12 NOTE — Patient Instructions (Signed)
Hakikisha kufuata na Dk Riccio mnamo 03/18/16. [name] atafanya miadi Andreas Blowerkwako kuona Dr Glynda JaegerSnider kwa [facility] Mon[place]ako

## 2016-03-13 ENCOUNTER — Ambulatory Visit: Payer: Self-pay | Admitting: *Deleted

## 2016-03-13 ENCOUNTER — Telehealth: Payer: Self-pay | Admitting: *Deleted

## 2016-03-13 VITALS — BP 126/74 | HR 72 | Temp 98.3°F

## 2016-03-13 DIAGNOSIS — B20 Human immunodeficiency virus [HIV] disease: Secondary | ICD-10-CM

## 2016-03-13 NOTE — Telephone Encounter (Signed)
RN contacted the patient through [employer] Interpreter.  [name] was available and stated it was his sister who was home earlier. Anthony. [name] stated I could return this evening for a visit. RN on her way to see the patient

## 2016-03-13 NOTE — Progress Notes (Signed)
RN arrived at [name]'s home for our prearranged visit. After connecting with [employer] Interpreter/[name] translation I knocked on the patient's door. The door was answered by a lady who stated [name] left but would be returning soon. The lady thanked me for all that we have done for [name] and I assured her that I would return at a later time to try and catch [name]

## 2016-03-17 NOTE — Progress Notes (Signed)
RN had attempted to call the patient for a home visit and was unable to reach him by phone. I was aware that the patient has a pharmacy clinic appt with us so I meet him for that appt at this time. I reintroducted myself and through the [employer] Resource Intepreter, reminded the patient of what I do in the community. Mr [name] agreed to my services and stated it would be ok to me to dress in scrubs. His family does not know all the the details but understands that he is sick so they would welcome someone coming to his home to help.   During this visit Minh(Pharmact) explained to the patient the importance of taking his medications and that his health is very serious right now. Minh explained to Mr [name] that he can easily die if he does not comply with is medication regimen and stay in care with us and with Family Medicine(who will be managing his blood clot/PE) Minh explained to Mr [name] that he has major resistance to ARTs already so Dr. Daiva EvesVan Dam placed him on Tivicay and Prezcobix.With a chart Minh has the patient point out his regimen and he was able to point out all of the correct tablets while he is here and admitted to taking them. I agreed to transport the patient to his next appt with [facility], who is handling his anticoagulation. If the patient does not manage his PE I cannot expect him to be well enough to manage his HIV.  He promised to be adherent to his meds. He complained of some rash on his face and nausea with taking all of his meds. Minh will let him try claritin give him some PRN zofran for nausea.  Thorough the interpreter RN explained and have the patient sign his consent for my services.  [facility] and was able to get the patient's Zofran and Claritin. RN traveled back to the clinic and gave [name] the medication, explained how to take the medications and this his insurance would only pay for 9 Zofran every 30 days. RN also  gave [name] a bus pass to get back home.

## 2016-03-17 NOTE — Patient Instructions (Signed)
RN met with client and Optometrist for [name] translation. RN reviewed Transport planner, [place], Home Safety Management Information Booklet. Home Fire Safety Assessment, Fall Risk Assessment and Suicide Risk Assessment was performed. RN also discussed information on a Living Will, Advanced Directives, and [place]. RN and Client/Designated Party educated/reviewed/signed Client Agreement and Consent for Service form along with Patient Rights and Responsibilities statement. RN developed patient specific and centered care plan. RN provided contact information and reviewed how to receive emergency help after hours for schedule changes, reporting of safety issues, falls, concerns or any needs/questions. Standard Precaution and Infection control along with interventions to correct or prevent high risk behaviors instructed to the patient. Client/Caregiver reports understanding and agreement with the above   RN chose this time to review the above information since we have a in-person interpreter on site. My next visit in the home we will have to use interpreter services by phone which could be challenging to explain over the phone

## 2016-03-17 NOTE — Progress Notes (Signed)
RN meet with the [facility]. Through [facility] Interpreter services/[name] translation, RN explained to the patient the purpose of my position which is to help my community with medication adherence, support with life struggles and linkage to [employer]. [name] stated he would be interested in having the services and looks forward to being discharged. RN plans to meet the patient at the clinic or at his home.   The purpose for performing this hospital visit is for better success with linking with the patient after discharge. I typically have greater success if I can meet the patient 1st prior to calling them and offering my services over the phone.

## 2016-03-18 ENCOUNTER — Ambulatory Visit: Payer: Self-pay | Admitting: *Deleted

## 2016-03-18 ENCOUNTER — Encounter: Payer: Self-pay | Admitting: Family Medicine

## 2016-03-18 ENCOUNTER — Telehealth: Payer: Self-pay | Admitting: *Deleted

## 2016-03-18 ENCOUNTER — Ambulatory Visit (INDEPENDENT_AMBULATORY_CARE_PROVIDER_SITE_OTHER): Payer: Commercial Managed Care - PPO | Admitting: Family Medicine

## 2016-03-18 VITALS — BP 104/60 | HR 100 | Temp 97.5°F | Ht 69.0 in | Wt 131.6 lb

## 2016-03-18 DIAGNOSIS — I2699 Other pulmonary embolism without acute cor pulmonale: Secondary | ICD-10-CM

## 2016-03-18 DIAGNOSIS — B59 Pneumocystosis: Secondary | ICD-10-CM

## 2016-03-18 DIAGNOSIS — B20 Human immunodeficiency virus [HIV] disease: Secondary | ICD-10-CM

## 2016-03-18 MED ORDER — APIXABAN 2.5 MG PO TABS: 2.5000 mg | ORAL_TABLET | Freq: Two times a day (BID) | ORAL | 5 refills | Status: DC

## 2016-03-18 MED ORDER — APIXABAN 5 MG PO TABS: 5.0000 mg | ORAL_TABLET | Freq: Two times a day (BID) | ORAL | 4 refills | Status: DC

## 2016-03-18 NOTE — Assessment & Plan Note (Signed)
  Afebrile, compliant with Bactrim Follows with ID, next appointment 2/1 with Dr. Drue SecondSnider  -continue taking prophylactic Bactrim -return precautions given for fevers, chills

## 2016-03-18 NOTE — Assessment & Plan Note (Addendum)
  Doing well, not using any oxygen at home, compliant with Eliquis  -refilled Eliquis today 2.5mg  BID to take for at least 6  months total -[name] (RN from ID office) will pick up this Rx for him. She also will work on getting [facility] charge -will see patient back in 2 months (his preference) -return precautions given

## 2016-03-18 NOTE — Progress Notes (Signed)
RN meet with the patient in his home. [name] lives with his sister and other relatives who appear very supportive. It is unclear if they are aware of his HIV status so Rn did not discuss anything related to HIV in there presence. [name] states he feels a lot better and his appetite has increased since he was discharged from the hospital. [name] denies any SOB with normal bowel movements. [name] had all medications in his home and was able to express correctly when and how often he is suppose to take his medications.   Order received on 03/02/2016 by Dr. Johny SaxJeffrey Hatcher  to evaluate [facility]). Initial Contact was made during the patient's hospitalization on 03/02/2016 Patient was evaluated in the home on 03/13/2016 for [facility]. Patient was consented to care at this time.   Frequency / Duration of [facility] visits: Effective 03/13/2016 221mo1, 273mo1, 642mo2,  3 PRN's for complications with disease process/progression, medication changes or concerns   [facility] will assess for learning needs related to diagnosis and treatment regimen, provide education as needed, fill pill box if needed, address any barriers which may be preventing medication compliance, and communicating with care team including physician and case manager.   Individualized Plan Of Care, Certification Period from 03/13/2016 to 06/11/2016  a. Type of service(s) and care to be delivered: RN Case Management  b. Frequency and duration of service: Effective 03/13/2016 871mo1, 693mo1, 252mo2,  3 prns for complications with disease process/progression, medication changes or concerns . Visits/Contact may be conducted telephonically or in person to best suit  the patient. [name] works from 7 to after 5 and financially he may not be able to be home for visits but could benefit from my additional assistance.  c. Activity restrictions: Pt may be up as tolerated and can safely ambulate without the need  for a assistive device   d. Safety Measures: Standard Precautions/Infection Control   e. Service Objectives and Goals: Service Objectives are to assist the pt with HIV medication regimen adherence and staying in care with the Infectious  Disease Clinic by identifying barriers to care. RN will address the barriers  that  are identified by the  patient. Patient centered goal is to stay healthy and continue to become stronger.  f. Equipment required: No additional equipment needs at this time   g. Functional Limitations: No functional limitation noted or expressed   h. Rehabilitation potential: Guarded   i. Diet and Nutritional Needs: Regular Diet   j. Medications and treatments: Medications have been reconciled and reviewed and are a part of EPIC electronic file   k. Specific therapies if needed: RN   l. Pertinent diagnoses: HIV disease,  Hx of medication NonCompliance, Pulmonary Embolism   m. Expected outcome: Guarded

## 2016-03-18 NOTE — Progress Notes (Signed)
    Subjective:    Patient ID: [name], male    DOB: 08/13/1970, 46 y.o.   MRN: [id number]   CC: here to follow up for PE  [name] is here with interpreter and nurse from ID office. Doing well since discharge from hospital. He has been taking all his medications but has had a reaction to the ART and has an itchy rash on his face. He is following with Dr. Clinton GallantVan Dam's office. He will see them back February 1st.   He has been taking his eliquis without issues. He denies shortness of breath, chest pain. Is not using any home oxygen at home and would like to have this picked up as he does not need it and is worried about getting charged for it.   He dislikes going to the doctor's. He would be willing to come back in 2 months.  Smoking status reviewed- former smoker  Review of Systems- no fevers, chills, vision changes, stomach pain, problems swallowing.   Objective:  BP 104/60   Pulse 100   Temp 97.5 F (36.4 C) (Oral)   Ht 5\' 9"  (1.753 m)   Wt 131 lb 9.6 oz (59.7 kg)   SpO2 94%   BMI 19.43 kg/m  Vitals and nursing note reviewed  General: chronically ill appearing man in no acute distress HEENT: normocephalic, TM's visualized bilaterally, no scleral icterus or conjunctival pallor, no nasal discharge, moist mucous membranes, good dentition without erythema or discharge noted in posterior oropharynx Neck: supple, non-tender, without lymphadenopathy Cardiac: RRR, clear S1 and S2, no murmurs, rubs, or gallops Respiratory: clear to auscultation bilaterally, no increased work of breathing Abdomen: soft, nontender, nondistended, no masses or organomegaly. Bowel sounds present Extremities: no edema or cyanosis. Warm, well perfused. 2+ radial and PT pulses bilaterally Skin: warm and dry, no rashes noted Neuro: alert and oriented, no focal deficits   Assessment & Plan:    PE (pulmonary thromboembolism) ([name])  Doing well, not using any oxygen at home, compliant with  Eliquis  -refilled Eliquis today 2.5mg  BID to take for at least 6  months total -[name] (RN from ID office) will pick up this Rx for him. 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 Drue SecondSnider  -continue taking prophylactic Bactrim -return precautions given for fevers, chills     Return in about 2 months (around 05/16/2016).   Dolores PattyAngela Mili Piltz, DO Family Medicine Resident PGY-1

## 2016-03-18 NOTE — Telephone Encounter (Signed)
RN meet with the patient in his home. [name] lives with his sister and other relatives who appear very supportive. It is unclear if they are aware of his HIV status so Rn did not discuss anything related to HIV in there presence. [name] states he feels a lot better and his appetite has increased since he was discharged from the hospital. [name] denies any SOB with normal bowel movements. [name] had all medications in his home and was able to express correctly when and how often he is suppose to take his medications.   Order received on 03/02/2016 by Dr. Jeffrey Hatcher  to evaluate patient for [facility] ([facility]). Initial Contact was made during the patient's hospitalization on 03/02/2016 Patient was evaluated in the home on 03/13/2016 for [facility]. Patient was consented to care at this time.   Frequency / Duration of [facility] visits: Effective 03/13/2016 1mo1, 3mo1, 2mo2,  3 PRN's for complications with disease process/progression, medication changes or concerns   [facility] will assess for learning needs related to diagnosis and treatment regimen, provide education as needed, fill pill box if needed, address any barriers which may be preventing medication compliance, and communicating with care team including physician and case manager.   Individualized Plan Of Care, Certification Period from 03/13/2016 to 06/11/2016  a. Type of service(s) and care to be delivered: RN Case Management  b. Frequency and duration of service: Effective 03/13/2016 1mo1, 3mo1, 2mo2,  3 prns for complications with disease process/progression, medication changes or concerns . Visits/Contact may be conducted telephonically or in person to best suit  the patient. [name] works from 7 to after 5 and financially he may not be able to be home for visits but could benefit from my additional assistance.  c. Activity restrictions: Pt may be up as tolerated and can safely ambulate without the need  for a assistive device   d. Safety Measures: Standard Precautions/Infection Control   e. Service Objectives and Goals: Service Objectives are to assist the pt with HIV medication regimen adherence and staying in care with the Infectious  Disease Clinic by identifying barriers to care. RN will address the barriers  that  are identified by the  patient. Patient centered goal is to stay healthy and continue to become stronger.  f. Equipment required: No additional equipment needs at this time   g. Functional Limitations: No functional limitation noted or expressed   h. Rehabilitation potential: Guarded   i. Diet and Nutritional Needs: Regular Diet   j. Medications and treatments: Medications have been reconciled and reviewed and are a part of EPIC electronic file   k. Specific therapies if needed: RN   l. Pertinent diagnoses: HIV disease,  Hx of medication NonCompliance, Pulmonary Embolism   m. Expected outcome: Guarded      

## 2016-03-18 NOTE — Patient Instructions (Addendum)
  Please make an appointment for 2 months for now with me.   I have refilled the Eliquis 5mg  BID to [facility]. Please keep taking this for the next 5 months.  Please take Bactrim every day for prevention of bacterial illnesses.  Please call if you need anything or have any questions.

## 2016-03-20 ENCOUNTER — Telehealth: Payer: Self-pay | Admitting: *Deleted

## 2016-03-20 ENCOUNTER — Other Ambulatory Visit: Payer: Self-pay | Admitting: Family Medicine

## 2016-03-20 DIAGNOSIS — I2699 Other pulmonary embolism without acute cor pulmonale: Secondary | ICD-10-CM

## 2016-03-20 NOTE — Telephone Encounter (Addendum)
During my last visit with [facility](at [facility]/Dr Wonda Oldsiccio) patient requested that we have his home oxygen picked up since he has not needed it. Patient stated he is concerned about the cost as well since he is not using the oxygen. RN informed Mr Aretha ParrotKaboya that I will f/u for him.  RN contacted [facility] and they have orders for the oxygenation. [facility] stated that cannot pick up the oxygen until they receive a order from the Dr approving that the oxygen can be discontinued. The order to discontinue the oxtgen must then be faxed to [facility] at [phone].   Message has been sent to Dr Wonda Oldsiccio requesting a written order to discontinue to oxygen

## 2016-03-24 ENCOUNTER — Ambulatory Visit: Payer: Self-pay | Admitting: *Deleted

## 2016-03-24 VITALS — BP 122/76 | HR 68 | Temp 96.3°F

## 2016-03-24 DIAGNOSIS — B20 Human immunodeficiency virus [HIV] disease: Secondary | ICD-10-CM

## 2016-03-25 NOTE — Progress Notes (Signed)
During my last visit with the patient we arranged for me to transport the patient to his upcoming appt with Dr Wonda Oldsiccio. RN confirmed that the patient still has a appt with Family Medicine prior to traveling to the patient's home.  RN arrived at the patient's home and he was already outside. Patient assumed his appt was a 3pm but it is actually at 2pm. [facility] his care behind me. RN stated for the patient's appt and was able to confirm that his Eliquis has been refilled by Dr Wonda Oldsiccio. Patient also asked the the O2 tank at his home be removed because he has not been using in and it afraid that he may be charged for the service that he is not using. Rn agreed to manage that process

## 2016-03-30 ENCOUNTER — Telehealth: Payer: Self-pay | Admitting: Family Medicine

## 2016-03-30 NOTE — Telephone Encounter (Signed)
Mcneil R.N dropped off form for PA on Eliquis Please contact Mrs Anthony RisingMcneil when PA ready. Ph: [phone]

## 2016-03-30 NOTE — Telephone Encounter (Signed)
PA notification placed in RN's box.  Nothing for clinical staff to complete.  Jazmin Hartsell,CMA

## 2016-03-31 ENCOUNTER — Other Ambulatory Visit: Payer: Self-pay | Admitting: Infectious Disease

## 2016-03-31 ENCOUNTER — Ambulatory Visit: Payer: Self-pay | Admitting: *Deleted

## 2016-03-31 VITALS — BP 118/72 | HR 68 | Temp 97.2°F

## 2016-03-31 DIAGNOSIS — B20 Human immunodeficiency virus [HIV] disease: Secondary | ICD-10-CM

## 2016-03-31 DIAGNOSIS — I2699 Other pulmonary embolism without acute cor pulmonale: Secondary | ICD-10-CM

## 2016-03-31 NOTE — Telephone Encounter (Signed)
PA completed online at www.covermymeds.com for Eliquis 2.5 mg #60.  PA was approved via [employer] valid dates: 03/01/16-03/31/17. Case ID: [id number].  Allayne ButcherAmber McNeil, RN with [facility] informed of approval.  Clovis PuMartin, Tamika L, RN

## 2016-04-06 ENCOUNTER — Telehealth: Payer: Self-pay | Admitting: *Deleted

## 2016-04-06 NOTE — Telephone Encounter (Signed)
Through Interpreter services, RN contacted [name] to make him aware that  I have requested a refill for his Zithromax and his Bactrim. RN informed My [name] that the 2 should be ready for pick up and it will not be a cost. [name] thanked me and stated he will be sure to pick the medications up. He confirmed that he should [facility] for the medications.

## 2016-04-06 NOTE — Telephone Encounter (Signed)
-----   Message from Doreene ElandKehinde T Eniola, MD sent at 03/10/2016  2:25 PM EST ----- I am covering Dr. McDiarmid's inbox. I saw this positive CMV result. Upon reviewing patient's note it is unclear to me if this has been addressed. It does seem he is already under infectious disease specialist's care.  I will defer follow up to his PCP. Result forwarded to him. Also [facility] to [facility] to call patient to schedule appointment with his PCP or Dr. McDiarmid who might have been involved in his care while hospitalized.  Please help patient schedule earliest available appointment with his PCP to discuss result.

## 2016-04-06 NOTE — Telephone Encounter (Signed)
PT had appointment on 03/18/16 and has an upcoming appointment in March. [name], [name] D, [place]

## 2016-04-14 LAB — ACID FAST CULTURE WITH REFLEXED SENSITIVITIES: ACID FAST CULTURE - AFSCU3: NEGATIVE

## 2016-04-16 ENCOUNTER — Other Ambulatory Visit: Payer: Self-pay | Admitting: Internal Medicine

## 2016-04-16 ENCOUNTER — Telehealth: Payer: Self-pay

## 2016-04-16 ENCOUNTER — Ambulatory Visit (INDEPENDENT_AMBULATORY_CARE_PROVIDER_SITE_OTHER): Payer: Commercial Managed Care - PPO | Admitting: Internal Medicine

## 2016-04-16 ENCOUNTER — Telehealth: Payer: Self-pay | Admitting: *Deleted

## 2016-04-16 ENCOUNTER — Encounter: Payer: Self-pay | Admitting: Internal Medicine

## 2016-04-16 VITALS — BP 104/71 | HR 97 | Temp 97.8°F | Ht 70.0 in | Wt 143.0 lb

## 2016-04-16 DIAGNOSIS — B59 Pneumocystosis: Secondary | ICD-10-CM | POA: Diagnosis not present

## 2016-04-16 DIAGNOSIS — I2699 Other pulmonary embolism without acute cor pulmonale: Secondary | ICD-10-CM

## 2016-04-16 DIAGNOSIS — B20 Human immunodeficiency virus [HIV] disease: Secondary | ICD-10-CM | POA: Diagnosis not present

## 2016-04-16 DIAGNOSIS — H543 Unqualified visual loss, both eyes: Secondary | ICD-10-CM

## 2016-04-16 LAB — CBC WITH DIFFERENTIAL/PLATELET
BASOS PCT: 1 %
Basophils Absolute: 38 cells/uL (ref 0–200)
EOS ABS: 342 {cells}/uL (ref 15–500)
Eosinophils Relative: 9 %
HEMATOCRIT: 39.6 % (ref 38.5–50.0)
Hemoglobin: 13 g/dL — ABNORMAL LOW (ref 13.2–17.1)
LYMPHS PCT: 49 %
Lymphs Abs: 1862 cells/uL (ref 850–3900)
MCH: 33.7 pg — ABNORMAL HIGH (ref 27.0–33.0)
MCHC: 32.8 g/dL (ref 32.0–36.0)
MCV: 102.6 fL — AB (ref 80.0–100.0)
MONO ABS: 418 {cells}/uL (ref 200–950)
MPV: 10 fL (ref 7.5–12.5)
Monocytes Relative: 11 %
NEUTROS PCT: 30 %
Neutro Abs: 1140 cells/uL — ABNORMAL LOW (ref 1500–7800)
Platelets: 180 10*3/uL (ref 140–400)
RBC: 3.86 MIL/uL — ABNORMAL LOW (ref 4.20–5.80)
RDW: 16.2 % — AB (ref 11.0–15.0)
WBC: 3.8 10*3/uL (ref 3.8–10.8)

## 2016-04-16 LAB — COMPLETE METABOLIC PANEL WITH GFR
ALBUMIN: 3.7 g/dL (ref 3.6–5.1)
ALT: 10 U/L (ref 9–46)
AST: 16 U/L (ref 10–40)
Alkaline Phosphatase: 83 U/L (ref 40–115)
BILIRUBIN TOTAL: 0.2 mg/dL (ref 0.2–1.2)
BUN: 20 mg/dL (ref 7–25)
CALCIUM: 9.4 mg/dL (ref 8.6–10.3)
CHLORIDE: 104 mmol/L (ref 98–110)
CO2: 30 mmol/L (ref 20–31)
CREATININE: 1.15 mg/dL (ref 0.60–1.35)
GFR, EST AFRICAN AMERICAN: 88 mL/min (ref >=60)
GFR, Est Non African American: 76 mL/min (ref >=60)
Glucose, Bld: 85 mg/dL (ref 65–99)
Potassium: 4.8 mmol/L (ref 3.5–5.3)
Sodium: 140 mmol/L (ref 135–146)
Total Protein: 8.6 g/dL — ABNORMAL HIGH (ref 6.1–8.1)

## 2016-04-16 MED ORDER — AZITHROMYCIN 600 MG PO TABS: 600.0000 mg | ORAL_TABLET | ORAL | 5 refills | Status: DC

## 2016-04-16 NOTE — Telephone Encounter (Signed)
Called Pt to tell him about his up coming appointment for Opthalmology at Dr.Groat's office, date Monday February 5th 2018 at 0800. Pt said he was writing the appointment down as we spoke and asked for me to text him the address. I asked the Pt if he had my chart and he stated no. I sent a text link for him to activate his mychart and shortly after Mr.Duthie hung up the phone. I will call back at a later time to make sure he has all the information he needs for his appointment.  Dr.Groat [address] [place] [id number] [phone]

## 2016-04-16 NOTE — Telephone Encounter (Signed)
RN received a call from patient's son stating he Dad([name]) would like to know when and where is eye appt is set up for. RN informed the son that I will check the system. My Aretha ParrotKaboya understands very little [place]. He speaks [name].   RN informed the son of the appt date and time along with texting the address, date, time and phone number for the eye appt

## 2016-04-16 NOTE — Progress Notes (Signed)
RFV: hiv disease follow up  Patient ID: [name], male   DOB: 08/25/1970, 46 y.o.   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 He was empirically treated for pcp (fungitell > 500, PCP IFA was negative). He had low level CMV viremia as we would expect during acute illness  - just restarted on ART in December, he previously had been out of his meds for roughly 6 months - he is able to tell me which ART he is taking in addition to bactrim  ROS: weight gain, no diarrhea, no rash. He reports that he has some decreased vision at night, he denies blurry vision.  Outpatient Encounter Prescriptions as of 04/16/2016  Medication Sig  . apixaban (ELIQUIS) 2.5 MG TABS tablet Take 1 tablet (2.5 mg total) by mouth 2 (two) times daily.  Marland Kitchen. azithromycin (ZITHROMAX) 600 MG tablet Take 2 tablets (1,200 mg total) by mouth every 7 (seven) days.  . darunavir-cobicistat (PREZCOBIX) 800-150 MG tablet Take 1 tablet by mouth daily with breakfast. Swallow whole. Do NOT crush, break or chew tablets. Take with food.  . DESCOVY 200-25 MG tablet TAKE 1 TABLET BY MOUTH ONCE DAILY  . dolutegravir (TIVICAY) 50 MG tablet Take 1 tablet (50 mg total) by mouth daily.  Marland Kitchen. guaiFENesin-dextromethorphan (ROBITUSSIN DM) 100-10 MG/5ML syrup Take 5 mLs by mouth every 4 (four) hours as needed for cough.  . loratadine (CLARITIN) 10 MG tablet Take 1 tablet (10 mg total) by mouth daily.  . methocarbamol (ROBAXIN) 500 MG tablet Take 1 tablet (500 mg total) by mouth every 6 (six) hours as needed for muscle spasms.  . ondansetron (ZOFRAN) 4 MG tablet Take 1 tablet (4 mg total) by mouth every 8 (eight) hours as needed for nausea or vomiting.  Marland Kitchen. oxyCODONE-acetaminophen (PERCOCET) 10-325 MG per tablet Take 1 tablet by mouth  every 6 (six) hours as needed.  . sulfamethoxazole-trimethoprim (BACTRIM DS,SEPTRA DS) 800-160 MG tablet Take 1 tablet by mouth daily.  Marland Kitchen. sulfamethoxazole-trimethoprim (BACTRIM DS,SEPTRA DS) 800-160 MG tablet Take 2 tablets by mouth 3 (three) times daily.   No facility-administered encounter medications on file as of 04/16/2016.      Patient Active Problem List   Diagnosis Date Noted  . 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 Hypoxia   . Acute respiratory failure with hypoxia ([name]) 02/25/2016  . Normochromic normocytic anemia 02/25/2016  . Pneumonia 02/25/2016  . Protein-calorie malnutrition, severe 02/25/2016  . PE (pulmonary thromboembolism) ([name]) 02/24/2016  . Renal insufficiency   . Surgery, elective   . AIDS ([name])   . S/P ORIF (open reduction internal fixation) fracture 03/21/2014  . Low back pain 10/10/2013  . Multifocal pneumonia 09/27/2013  . HIV disease ([name]) 09/18/2013  . Refugee health examination 09/18/2013  . Cough 09/18/2013     Health Maintenance Due  Topic Date Due  . TETANUS/TDAP  03/16/1990     Review of Systems See hpi, otherwise 10 point ros is negative Physical Exam   BP 104/71   Pulse 97   Temp 97.8 F (36.6 C) (Oral)   Ht 5\' 10"  (1.778 m)   Wt 143 lb (64.9 kg)   BMI 20.52 kg/m   Physical Exam  Constitutional: He is oriented to  person, place, and time. He appears well-developed and well-nourished. No distress.  HENT:  Eye exam did not see  Any signs consistent with retinitis, though it was an undilated exam Mouth/Throat: Oropharynx is clear and moist. No oropharyngeal exudate.  Cardiovascular: Normal rate, regular rhythm and normal heart sounds. Exam reveals no gallop and no friction rub.  No murmur heard.  Pulmonary/Chest: Effort normal and breath sounds normal. No respiratory distress. He has no wheezes.  Abdominal: Soft. Bowel sounds are normal. He exhibits no distension. There is no tenderness.    Lymphadenopathy:  He has no cervical adenopathy.  Neurological: He is alert and oriented to person, place, and time.  Skin: Skin is warm and dry. No rash noted. No erythema. Hyperpigmented spots on legs, will continue to watch left ankle Psychiatric: He has a normal mood and affect. His behavior is normal.    Lab Results  Component Value Date   CD4TCELL 4 (L) 02/25/2016   Lab Results  Component Value Date   CD4TABS 20 (L) 02/25/2016   CD4TABS 60 (L) 03/22/2014   CD4TABS 68 (L) 10/26/2013   Lab Results  Component Value Date   HIV1RNAQUANT 54,000 02/25/2016   No results found for: HEPBSAB Lab Results  Component Value Date   LABRPR NON REAC 03/22/2014    CBC Lab Results  Component Value Date   WBC 3.4 (L) 03/04/2016   RBC 4.11 (L) 03/04/2016   HGB 13.2 03/04/2016   HCT 38.8 (L) 03/04/2016   PLT 270 03/04/2016   MCV 94.4 03/04/2016   MCH 32.1 03/04/2016   MCHC 34.0 03/04/2016   RDW 11.4 (L) 03/04/2016   LYMPHSABS 0.5 (L) 02/26/2016   MONOABS 0.7 02/26/2016   EOSABS 0.0 02/26/2016    BMET Lab Results  Component Value Date   NA 135 03/12/2016   K 5.0 03/12/2016   CL 100 03/12/2016   CO2 26 03/12/2016   GLUCOSE 83 03/12/2016   BUN 23 03/12/2016   CREATININE 1.18 03/12/2016   CALCIUM 9.3 03/12/2016   GFRNONAA 58 (L) 03/04/2016   GFRAA >60 03/04/2016      Assessment and Plan hiv disease/AIDS, poorly controlled  - will check labs today, add geno - continue on current regimen  oi proph- will need him to continue with bactrim, will add azithromycin weekly  Hx of pcp pneumonia= needs to continue on bactrim ds daily until cd 4 count > 200  - decreased vision - snellen test looks like he may have 20/30 on OD, and 40/20? On OS. I asked him to tell me the letters in french but not sure how literate he is. He appeared to have more difficulty and hesistation when he was doing exam with left eye. He agreed that his vision is more difficult with closing left eyte. I  suspect he needs to be refracted. Will get him set up with dr grout for eye exam  Clinically it does not sound like cmv retinitis, IRIS  - will let amber know to help him getting access to glasses  Recent PE = continue on anticoagulation  Return to clinic in 4 wk  Spent 45 min with patient using interpreter phone, greater than 50% counseling on adherence counseling for HIV disease, and management of visual complaint

## 2016-04-17 ENCOUNTER — Other Ambulatory Visit: Payer: Self-pay | Admitting: Internal Medicine

## 2016-04-17 DIAGNOSIS — B20 Human immunodeficiency virus [HIV] disease: Secondary | ICD-10-CM

## 2016-04-17 LAB — T-HELPER CELL (CD4) - (RCID CLINIC ONLY)
CD4 T CELL HELPER: 3 % — AB (ref 33–55)
CD4 T Cell Abs: 50 /uL — ABNORMAL LOW (ref 400–2700)

## 2016-04-20 NOTE — Progress Notes (Signed)
RN made a home visit with [name] who is feeling much better. Mr [name] stated he continues to take his medications each day without any missed days or concerns.  Together we reviewed each medication dosage and schedule. Mr [name] ahs a clear understanding of what medications to take along with how often to take them. [name] stated his oxygen machine has not been picked up yet but explained to him that it may take a couple more days. He denies any SOB, pain or complications at this time. Rn will continue to monitor Mr  [name] and offer assistance as needed.

## 2016-04-20 NOTE — Progress Notes (Signed)
RN received a call from Mr [name], using broken English he tried to explain to me that he is having a difficult time with getting his medication from the pharmacy. Currently Mr [name] was at the pharmacy so I asked to speak with the pharmacy tech/Ashley. Together [place] and I was able to work of getting the patient's Eliquis(for his PE).  Later on that afternoon I received another call from Mr [name] asking about his medications. RN asked the patient if I could come by and work of the problem with the assistance of a interpreter.  Patient agreed  Currently I am at the patient's home and with the assitance of interpreter services Mr [name] and I went over each medication. The confusion was because [name] felt he had a new medication but actually had 2 bottles of Eliquis. RN explained this to Mr [name] and combined the medication for him. RN also called in a refill for [name]'s antibiotics as well.

## 2016-04-23 ENCOUNTER — Telehealth: Payer: Self-pay | Admitting: *Deleted

## 2016-04-23 ENCOUNTER — Observation Stay (HOSPITAL_COMMUNITY)
Admission: EM | Admit: 2016-04-23 | Discharge: 2016-04-24 | Disposition: A | Discharge status: Home / Self Care | Payer: Commercial Managed Care - PPO | Attending: Internal Medicine | Admitting: Internal Medicine

## 2016-04-23 ENCOUNTER — Encounter (HOSPITAL_COMMUNITY): Payer: Self-pay

## 2016-04-23 ENCOUNTER — Emergency Department (HOSPITAL_COMMUNITY): Payer: Commercial Managed Care - PPO

## 2016-04-23 DIAGNOSIS — E43 Unspecified severe protein-calorie malnutrition: Secondary | ICD-10-CM | POA: Diagnosis present

## 2016-04-23 DIAGNOSIS — Z86711 Personal history of pulmonary embolism: Secondary | ICD-10-CM | POA: Diagnosis not present

## 2016-04-23 DIAGNOSIS — R05 Cough: Secondary | ICD-10-CM | POA: Diagnosis present

## 2016-04-23 DIAGNOSIS — B20 Human immunodeficiency virus [HIV] disease: Secondary | ICD-10-CM | POA: Diagnosis present

## 2016-04-23 DIAGNOSIS — Z79899 Other long term (current) drug therapy: Secondary | ICD-10-CM | POA: Insufficient documentation

## 2016-04-23 DIAGNOSIS — R69 Illness, unspecified: Secondary | ICD-10-CM

## 2016-04-23 DIAGNOSIS — Z681 Body mass index (BMI) 19 or less, adult: Secondary | ICD-10-CM | POA: Insufficient documentation

## 2016-04-23 DIAGNOSIS — J101 Influenza due to other identified influenza virus with other respiratory manifestations: Principal | ICD-10-CM | POA: Insufficient documentation

## 2016-04-23 DIAGNOSIS — Z8611 Personal history of tuberculosis: Secondary | ICD-10-CM | POA: Insufficient documentation

## 2016-04-23 DIAGNOSIS — J9601 Acute respiratory failure with hypoxia: Secondary | ICD-10-CM | POA: Diagnosis present

## 2016-04-23 DIAGNOSIS — I5022 Chronic systolic (congestive) heart failure: Secondary | ICD-10-CM | POA: Diagnosis present

## 2016-04-23 DIAGNOSIS — R64 Cachexia: Secondary | ICD-10-CM | POA: Diagnosis not present

## 2016-04-23 DIAGNOSIS — Z87891 Personal history of nicotine dependence: Secondary | ICD-10-CM | POA: Diagnosis not present

## 2016-04-23 DIAGNOSIS — J111 Influenza due to unidentified influenza virus with other respiratory manifestations: Secondary | ICD-10-CM | POA: Diagnosis present

## 2016-04-23 DIAGNOSIS — N179 Acute kidney failure, unspecified: Secondary | ICD-10-CM | POA: Diagnosis not present

## 2016-04-23 DIAGNOSIS — I2699 Other pulmonary embolism without acute cor pulmonale: Secondary | ICD-10-CM | POA: Diagnosis present

## 2016-04-23 DIAGNOSIS — B59 Pneumocystosis: Secondary | ICD-10-CM | POA: Diagnosis present

## 2016-04-23 DIAGNOSIS — Z792 Long term (current) use of antibiotics: Secondary | ICD-10-CM | POA: Diagnosis not present

## 2016-04-23 LAB — CBC WITH DIFFERENTIAL/PLATELET
BASOS PCT: 0 %
Basophils Absolute: 0 10*3/uL (ref 0.0–0.1)
EOS ABS: 0 10*3/uL (ref 0.0–0.7)
EOS PCT: 0 %
HCT: 37.9 % — ABNORMAL LOW (ref 39.0–52.0)
HEMOGLOBIN: 12.7 g/dL — AB (ref 13.0–17.0)
LYMPHS ABS: 1.9 10*3/uL (ref 0.7–4.0)
Lymphocytes Relative: 23 %
MCH: 34.1 pg — AB (ref 26.0–34.0)
MCHC: 33.5 g/dL (ref 30.0–36.0)
MCV: 101.9 fL — ABNORMAL HIGH (ref 78.0–100.0)
MONOS PCT: 10 %
Monocytes Absolute: 0.9 10*3/uL (ref 0.1–1.0)
NEUTROS PCT: 67 %
Neutro Abs: 5.6 10*3/uL (ref 1.7–7.7)
PLATELETS: 116 10*3/uL — AB (ref 150–400)
RBC: 3.72 MIL/uL — AB (ref 4.22–5.81)
RDW: 15.7 % — ABNORMAL HIGH (ref 11.5–15.5)
WBC: 8.4 10*3/uL (ref 4.0–10.5)

## 2016-04-23 LAB — COMPREHENSIVE METABOLIC PANEL
ALK PHOS: 59 U/L (ref 38–126)
ALT: 31 U/L (ref 17–63)
AST: 62 U/L — AB (ref 15–41)
Albumin: 3.4 g/dL — ABNORMAL LOW (ref 3.5–5.0)
Anion gap: 10 (ref 5–15)
BUN: 18 mg/dL (ref 6–20)
CHLORIDE: 101 mmol/L (ref 101–111)
CO2: 23 mmol/L (ref 22–32)
CREATININE: 1.28 mg/dL — AB (ref 0.61–1.24)
Calcium: 9.6 mg/dL (ref 8.9–10.3)
Glucose, Bld: 121 mg/dL — ABNORMAL HIGH (ref 65–99)
Potassium: 3.6 mmol/L (ref 3.5–5.1)
Sodium: 134 mmol/L — ABNORMAL LOW (ref 135–145)
Total Bilirubin: 0.9 mg/dL (ref 0.3–1.2)
Total Protein: 8.7 g/dL — ABNORMAL HIGH (ref 6.5–8.1)

## 2016-04-23 LAB — I-STAT ARTERIAL BLOOD GAS, ED
ACID-BASE EXCESS: 1 mmol/L (ref 0.0–2.0)
Bicarbonate: 24.4 mmol/L (ref 20.0–28.0)
O2 Saturation: 93 %
PH ART: 7.45 (ref 7.350–7.450)
TCO2: 25 mmol/L (ref 0–100)
pCO2 arterial: 35.3 mmHg (ref 32.0–48.0)
pO2, Arterial: 65 mmHg — ABNORMAL LOW (ref 83.0–108.0)

## 2016-04-23 LAB — LACTATE DEHYDROGENASE: LDH: 296 U/L — AB (ref 98–192)

## 2016-04-23 MED ORDER — SODIUM CHLORIDE 0.9 % IV SOLN
INTRAVENOUS | Status: DC
  Administered 2016-04-23: 23:00:00 via INTRAVENOUS

## 2016-04-23 MED ORDER — IBUPROFEN 400 MG PO TABS
600.0000 mg | ORAL_TABLET | Freq: Once | ORAL | Status: AC
  Administered 2016-04-23: 600 mg via ORAL
  Filled 2016-04-23: qty 1

## 2016-04-23 NOTE — ED Notes (Signed)
Patient transported to X-ray 

## 2016-04-23 NOTE — ED Notes (Signed)
ABG collected  

## 2016-04-23 NOTE — ED Provider Notes (Signed)
[facility]-EMERGENCY DEPT Provider Note   CSN: [id number] Arrival date & time: 04/23/16  1627   By signing my name below, I, Clarisse Gouge, attest that this documentation has been prepared under the direction and in the presence of [facility], FNP. Electronically Signed: Clarisse Gouge, Scribe. 04/23/16. 8:43 PM.   History   Chief Complaint Chief Complaint  Patient presents with  . Cough  . Generalized Body Aches   The history is provided by the patient and medical records. The history is limited by a language barrier. A language interpreter was used.    HPI Comments: [name] is a 46 y.o. male BIB EMS with Hx of HIV, PE and tuberculosis who presents to the Emergency Department complaining of gradually worsening cough x 3 days. He notes associated vomiting; episodic, severe headache yesterday and generalized body aches. He notes he has not taken any OTC medications to treat his symptoms. He also states he takes Eliquis and undisclosed medications for HIV at home. Pt's CD4 counts were reportedly last measured ~1 week ago, and the pt had a T4 count of 50 at the time. Pt denies N/V/D, ear pain, abdominal pain, constipation, fever and chills. PCP Judyann Munson, MD. Patient speaks [name].   Past Medical History:  Diagnosis Date  . Depression    "stress and depression for any man is common" (03/21/2014)  . Hepatitis    "I don't know what hepatitis I have"  . HIV disease ([name])   . TB (pulmonary tuberculosis)    previously treated according to refugee documentation    Patient Active Problem List   Diagnosis Date Noted  . 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 Chronic systolic CHF (congestive heart failure) ([name]) 04/24/2016  . AKI (acute kidney injury) ([name]) 04/24/2016  . 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 Hypoxia   . Acute respiratory failure with hypoxia ([name]) 02/25/2016  . Normochromic normocytic anemia 02/25/2016  .  Pneumonia 02/25/2016  . Protein-calorie malnutrition, severe 02/25/2016  . PE (pulmonary thromboembolism) ([name]) 02/24/2016  . Renal insufficiency   . Surgery, elective   . AIDS ([name])   . S/P ORIF (open reduction internal fixation) fracture 03/21/2014  . Low back pain 10/10/2013  . Multifocal pneumonia 09/27/2013  . HIV disease ([name]) 09/18/2013  . Refugee health examination 09/18/2013  . Cough 09/18/2013    Past Surgical History:  Procedure Laterality Date  . FRACTURE SURGERY    . IM NAILING TIBIA Right 03/21/2014  . ORIF ANKLE FRACTURE Right 03/21/2014   lateral malleolus/notes 03/21/2014  . ORIF ANKLE FRACTURE Right 03/21/2014   Procedure: OPEN REDUCTION INTERNAL FIXATION (ORIF) pilon ;  Surgeon: Eldred Manges, MD;  Location: [facility] OR;  Service: Orthopedics;  Laterality: Right;  . TIBIA IM NAIL INSERTION Right 03/21/2014   Procedure: INTRAMEDULLARY (IM) NAIL TIBIAL;  Surgeon: Eldred Manges, MD;  Location: [facility] OR;  Service: Orthopedics;  Laterality: Right;  Marland Kitchen VIDEO BRONCHOSCOPY Bilateral 03/02/2016   Procedure: VIDEO BRONCHOSCOPY WITHOUT FLUORO;  Surgeon: Roslynn Amble, MD;  Location: [facility] ENDOSCOPY;  Service: Cardiopulmonary;  Laterality: Bilateral;       Home Medications    Prior to Admission medications   Medication Sig Start Date End Date Taking? Authorizing Provider  apixaban (ELIQUIS) 2.5 MG TABS tablet Take 1 tablet (2.5 mg total) by mouth 2 (two) times daily. 03/18/16   Tillman Sers, DO  azithromycin (ZITHROMAX) 600 MG tablet Take 1 tablet (600 mg total)  by mouth every 7 (seven) days. 04/16/16   Judyann Munson, MD  darunavir-cobicistat (PREZCOBIX) 800-150 MG tablet Take 1 tablet by mouth daily with breakfast. Swallow whole. Do NOT crush, break or chew tablets. Take with food. 03/03/16   Randall Hiss, MD  dolutegravir (TIVICAY) 50 MG tablet Take 1 tablet (50 mg total) by mouth daily. 03/03/16   Randall Hiss, MD  emtricitabine-tenofovir AF (DESCOVY) 200-25 MG tablet Take 1  tablet by mouth daily. 04/17/16   Judyann Munson, MD  guaiFENesin-dextromethorphan [facility] DM) 100-10 MG/5ML syrup Take 5 mLs by mouth every 4 (four) hours as needed for cough. 12/11/13   Judyann Munson, MD  loratadine (CLARITIN) 10 MG tablet Take 1 tablet (10 mg total) by mouth daily. 03/12/16   Minh Q Pham, RPH  methocarbamol (ROBAXIN) 500 MG tablet Take 1 tablet (500 mg total) by mouth every 6 (six) hours as needed for muscle spasms. 03/23/14   Naida Sleight, PA-C  ondansetron (ZOFRAN) 4 MG tablet Take 1 tablet (4 mg total) by mouth every 8 (eight) hours as needed for nausea or vomiting. 03/12/16   Nicholes Calamity, RPH  oxyCODONE-acetaminophen (PERCOCET) 10-325 MG per tablet Take 1 tablet by mouth every 6 (six) hours as needed. 03/23/14   Naida Sleight, PA-C  sulfamethoxazole-trimethoprim (BACTRIM DS,SEPTRA DS) 800-160 MG tablet Take 1 tablet by mouth daily. 03/04/16   Randall Hiss, MD    Family History Family History  Problem Relation Age of Onset  . Hypertension Other   . Heart disease Sister     Social History Social History  Substance Use Topics  . Smoking status: Former Smoker    Packs/day: 0.50    Years: 6.00    Types: Cigarettes    Quit date: 03/16/2001  . Smokeless tobacco: Never Used     Comment: "quit smoking ~ 2003"  . Alcohol use Yes     Comment: drinks bottled beer intermittently     Allergies   Patient has no known allergies.   Review of Systems Review of Systems  Constitutional: Positive for fever. Negative for chills.  HENT: Positive for congestion and sore throat.   Respiratory: Positive for cough and shortness of breath.   Gastrointestinal: Positive for vomiting. Negative for abdominal pain and diarrhea.  Genitourinary: Negative for dysuria and frequency.  Musculoskeletal: Positive for myalgias.  Skin: Negative for rash.  Allergic/Immunologic: Positive for immunocompromised state.  Neurological: Positive for headaches. Negative for syncope.      Physical Exam Updated Vital Signs BP 119/96 (BP Location: Right Arm)   Pulse 110   Temp 99.5 F (37.5 C) (Oral)   Resp 16   SpO2 96%   Physical Exam  Constitutional: He is oriented to person, place, and time. Vital signs are normal.  Non-toxic appearance. No distress.  HENT:  Head: Normocephalic and atraumatic.  Mouth/Throat: Uvula is midline and mucous membranes are normal.  Tongue has a white coating; no anterior cervical adenopathy, left posterior cervical lymph enlargement.  Eyes: EOM are normal.  Neck: Normal range of motion. Neck supple.  No meningeal signs  Cardiovascular: Regular rhythm and intact distal pulses.  Tachycardia present.   Pulses:      Dorsalis pedis pulses are 2+ on the right side, and 2+ on the left side.  Pulmonary/Chest: No respiratory distress. He has decreased breath sounds. He has rales in the left lower field.  Abdominal: Soft. Normal appearance and bowel sounds are normal. There is no tenderness.  Musculoskeletal: Normal range of motion.  No lower extremity edema  Lymphadenopathy:    He has cervical adenopathy.  Neurological: He is alert and oriented to person, place, and time. He has normal strength.  Skin: Skin is warm, dry and intact. No rash noted.  Psychiatric: He has a normal mood and affect.  Nursing note and vitals reviewed.    ED Treatments / Results  DIAGNOSTIC STUDIES: Oxygen Saturation is 96% on Ra, adequate by my interpretation.    COORDINATION OF CARE: 8:40 PM Discussed treatment plan with pt at bedside and pt agreed to plan. Will order CXR and medications. 10:51 PM Dr. Erma HeritageIsaacs has agreed to evaluate the pt.  Labs (all labs ordered are listed, but only abnormal results are displayed) Labs Reviewed  COMPREHENSIVE METABOLIC PANEL - Abnormal; Notable for the following:       Result Value   Sodium 134 (*)    Glucose, Bld 121 (*)    Creatinine, Ser 1.28 (*)    Total Protein 8.7 (*)    Albumin 3.4 (*)    AST 62 (*)     All other components within normal limits  CBC WITH DIFFERENTIAL/PLATELET - Abnormal; Notable for the following:    RBC 3.72 (*)    Hemoglobin 12.7 (*)    HCT 37.9 (*)    MCV 101.9 (*)    MCH 34.1 (*)    RDW 15.7 (*)    Platelets 116 (*)    All other components within normal limits  LACTATE DEHYDROGENASE - Abnormal; Notable for the following:    LDH 296 (*)    All other components within normal limits  I-STAT ARTERIAL BLOOD GAS, ED - Abnormal; Notable for the following:    pO2, Arterial 65.0 (*)    All other components within normal limits  CULTURE, BLOOD (ROUTINE X 2)  CULTURE, BLOOD (ROUTINE X 2)  PNEUMOCYSTIS JIROVECI SMEAR BY DFA  CULTURE, EXPECTORATED SPUTUM-ASSESSMENT  GRAM STAIN  INFLUENZA PANEL BY PCR (TYPE A & B)  LACTIC ACID, PLASMA  LACTIC ACID, PLASMA  BRAIN NATRIURETIC PEPTIDE  CREATININE, URINE, RANDOM  SODIUM, URINE, RANDOM  HIV ANTIBODY (ROUTINE TESTING)  STREP PNEUMONIAE URINARY ANTIGEN   Radiology Dg Chest 2 View  Result Date: 04/23/2016 CLINICAL DATA:  Gradually worsening cough for 3 days. History of HIV and tuberculosis, pulmonary embolism. EXAM: CHEST  2 VIEW COMPARISON:  CT chest February 24, 2016 FINDINGS: Cardiomediastinal silhouette is normal. Cystic changes LEFT lung corresponding to bronchiectasis. RIGHT upper lobe scarring. No pleural effusion. No pneumothorax. Soft tissue planes included osseous structures are normal. IMPRESSION: Multifocal scarring and bronchiectasis without definite acute cardiopulmonary process though limited assessment given degree of baseline parenchymal abnormality. Electronically Signed   By: Awilda Metroourtnay  Bloomer M.D.   On: 04/23/2016 21:25   Consult with admitting MD and he will admit the patient. I will call ID for consult.   Procedures Procedures (including critical care time)  Medications Ordered in ED Medications  0.9 %  sodium chloride infusion ( Intravenous Rate/Dose Change 04/24/16 0142)  oseltamivir (TAMIFLU) capsule  75 mg (75 mg Oral Given 04/24/16 0150)  emtricitabine-tenofovir AF (DESCOVY) 200-25 MG per tablet 1 tablet (not administered)  azithromycin (ZITHROMAX) tablet 600 mg (not administered)  apixaban (ELIQUIS) tablet 2.5 mg (not administered)  loratadine (CLARITIN) tablet 10 mg (not administered)  ondansetron (ZOFRAN) tablet 4 mg (not administered)  sulfamethoxazole-trimethoprim (BACTRIM DS,SEPTRA DS) 800-160 MG per tablet 1 tablet (not administered)  darunavir-cobicistat (PREZCOBIX) 800-150 MG per tablet 1 tablet (not  administered)  dolutegravir (TIVICAY) tablet 50 mg (not administered)  methocarbamol (ROBAXIN) tablet 500 mg (not administered)  albuterol (PROVENTIL) (2.5 MG/3ML) 0.083% nebulizer solution 2.5 mg (not administered)  dextromethorphan-guaiFENesin (MUCINEX DM) 30-600 MG per 12 hr tablet 1 tablet (not administered)  acetaminophen (TYLENOL) tablet 650 mg (not administered)  predniSONE (DELTASONE) tablet 50 mg (not administered)  oxyCODONE-acetaminophen (PERCOCET/ROXICET) 5-325 MG per tablet 1 tablet (not administered)    And  oxyCODONE (Oxy IR/ROXICODONE) immediate release tablet 5 mg (not administered)  ibuprofen (ADVIL,MOTRIN) tablet 600 mg (600 mg Oral Given 04/23/16 2114)  methylPREDNISolone sodium succinate (SOLU-MEDROL) 125 mg/2 mL injection 125 mg (125 mg Intravenous Given 04/24/16 0136)   Dr. Marcello Moores in to examine the patient and review lab and x-ray results. Will give solumedrol 125 mg IV and I will consult with the hospitalitis for admission  Initial Impression / Assessment and Plan / ED Course  I have reviewed the triage vital signs and the nursing notes.  Pertinent labs & imaging results that were available during my care of the patient were reviewed by me and considered in my medical decision making (see chart for details).    I personally performed the services described in this documentation, which was scribed in my presence. The recorded information has been reviewed and is  accurate.  [id number] Awaiting call back from ID.  Dr. Clyde Lundborg states that if ID does not call he will discuss the case with them in the morning.   Final Clinical Impressions(s) / ED Diagnoses   Final diagnoses:  Influenza-like illness    New Prescriptions New Prescriptions   No medications on file     [facility] NP 04/24/16 0246    Shaune Pollack, MD 04/24/16 1702    Shaune Pollack, MD 04/24/16 [phone]

## 2016-04-23 NOTE — Telephone Encounter (Signed)
RN received a call from [name]'s son stating his father is very sick and needs car. The son stated [name] feels very bad, has a terrible cough and a fever. He denies SOB. RN informed the son that [name] needs to be seen at Urgent Care or the Emergency Room because he could have the FLU. The son agreed and asked if I could take him because he has something else to do. I informed the son that I cannot transport him and a ambulance trip will be expensive. RN asked if he or a family member could drop Mr [name] off at the hospital even if they cannot stay. The son stated he will speak with other family member to see if they can help.

## 2016-04-23 NOTE — ED Triage Notes (Signed)
Pt complaining of cough and general body aches x 2 days. Pt denies any N/V/D. Pt denies any fever/chills. Pt afebrile at triage.

## 2016-04-23 NOTE — ED Notes (Addendum)
Nurse drawing labs. 

## 2016-04-24 DIAGNOSIS — I2699 Other pulmonary embolism without acute cor pulmonale: Secondary | ICD-10-CM

## 2016-04-24 DIAGNOSIS — N179 Acute kidney failure, unspecified: Secondary | ICD-10-CM | POA: Diagnosis present

## 2016-04-24 DIAGNOSIS — J9601 Acute respiratory failure with hypoxia: Secondary | ICD-10-CM | POA: Diagnosis not present

## 2016-04-24 DIAGNOSIS — B59 Pneumocystosis: Secondary | ICD-10-CM

## 2016-04-24 DIAGNOSIS — B20 Human immunodeficiency virus [HIV] disease: Secondary | ICD-10-CM

## 2016-04-24 DIAGNOSIS — I5022 Chronic systolic (congestive) heart failure: Secondary | ICD-10-CM | POA: Diagnosis not present

## 2016-04-24 DIAGNOSIS — J111 Influenza due to unidentified influenza virus with other respiratory manifestations: Secondary | ICD-10-CM | POA: Diagnosis present

## 2016-04-24 HISTORY — DX: Pneumocystosis: B59

## 2016-04-24 LAB — BRAIN NATRIURETIC PEPTIDE: B NATRIURETIC PEPTIDE 5: 34.4 pg/mL (ref 0.0–100.0)

## 2016-04-24 LAB — INFLUENZA PANEL BY PCR (TYPE A & B)
INFLBPCR: NEGATIVE
Influenza A By PCR: POSITIVE — AB

## 2016-04-24 LAB — LACTIC ACID, PLASMA
LACTIC ACID, VENOUS: 0.8 mmol/L (ref 0.5–1.9)
Lactic Acid, Venous: 0.8 mmol/L (ref 0.5–1.9)

## 2016-04-24 MED ORDER — AZITHROMYCIN 600 MG PO TABS
600.0000 mg | ORAL_TABLET | ORAL | Status: DC
  Administered 2016-04-24: 600 mg via ORAL
  Filled 2016-04-24: qty 1

## 2016-04-24 MED ORDER — BOOST / RESOURCE BREEZE PO LIQD: 1.0000 | Freq: Three times a day (TID) | ORAL | 0 refills | Status: DC

## 2016-04-24 MED ORDER — SULFAMETHOXAZOLE-TRIMETHOPRIM 400-80 MG/5ML IV SOLN
360.0000 mg | Freq: Three times a day (TID) | INTRAVENOUS | Status: DC
  Administered 2016-04-24: 360 mg via INTRAVENOUS
  Filled 2016-04-24 (×2): qty 22.5

## 2016-04-24 MED ORDER — SULFAMETHOXAZOLE-TRIMETHOPRIM 400-80 MG/5ML IV SOLN
360.0000 mg | Freq: Three times a day (TID) | INTRAVENOUS | Status: DC
  Filled 2016-04-24: qty 22.5

## 2016-04-24 MED ORDER — ENSURE ENLIVE PO LIQD: 237.0000 mL | Freq: Two times a day (BID) | ORAL | Status: DC

## 2016-04-24 MED ORDER — ACETAMINOPHEN 325 MG PO TABS: 650.0000 mg | ORAL_TABLET | Freq: Four times a day (QID) | ORAL | Status: DC | PRN

## 2016-04-24 MED ORDER — METHYLPREDNISOLONE SODIUM SUCC 125 MG IJ SOLR
125.0000 mg | Freq: Once | INTRAMUSCULAR | Status: AC
  Administered 2016-04-24: 125 mg via INTRAVENOUS
  Filled 2016-04-24: qty 2

## 2016-04-24 MED ORDER — OXYCODONE-ACETAMINOPHEN 10-325 MG PO TABS: 1.0000 | ORAL_TABLET | Freq: Four times a day (QID) | ORAL | Status: DC | PRN

## 2016-04-24 MED ORDER — PREDNISONE 50 MG PO TABS
50.0000 mg | ORAL_TABLET | Freq: Every day | ORAL | Status: DC
  Administered 2016-04-24: 50 mg via ORAL
  Filled 2016-04-24: qty 1

## 2016-04-24 MED ORDER — METHOCARBAMOL 500 MG PO TABS: 500.0000 mg | ORAL_TABLET | Freq: Four times a day (QID) | ORAL | Status: DC | PRN

## 2016-04-24 MED ORDER — OXYCODONE HCL 5 MG PO TABS: 5.0000 mg | ORAL_TABLET | Freq: Four times a day (QID) | ORAL | Status: DC | PRN

## 2016-04-24 MED ORDER — LORATADINE 10 MG PO TABS
10.0000 mg | ORAL_TABLET | Freq: Every day | ORAL | Status: DC
  Administered 2016-04-24: 10 mg via ORAL
  Filled 2016-04-24: qty 1

## 2016-04-24 MED ORDER — SULFAMETHOXAZOLE-TRIMETHOPRIM 800-160 MG PO TABS: 1.0000 | ORAL_TABLET | Freq: Every day | ORAL | Status: DC

## 2016-04-24 MED ORDER — OSELTAMIVIR PHOSPHATE 75 MG PO CAPS: 75.0000 mg | ORAL_CAPSULE | Freq: Two times a day (BID) | ORAL | 0 refills | Status: DC

## 2016-04-24 MED ORDER — ONDANSETRON HCL 4 MG PO TABS: 4.0000 mg | ORAL_TABLET | Freq: Three times a day (TID) | ORAL | Status: DC | PRN

## 2016-04-24 MED ORDER — OXYCODONE-ACETAMINOPHEN 5-325 MG PO TABS: 1.0000 | ORAL_TABLET | Freq: Four times a day (QID) | ORAL | Status: DC | PRN

## 2016-04-24 MED ORDER — DARUNAVIR-COBICISTAT 800-150 MG PO TABS
1.0000 | ORAL_TABLET | Freq: Every day | ORAL | Status: DC
  Administered 2016-04-24: 1 via ORAL
  Filled 2016-04-24: qty 1

## 2016-04-24 MED ORDER — APIXABAN 2.5 MG PO TABS
2.5000 mg | ORAL_TABLET | Freq: Two times a day (BID) | ORAL | Status: DC
  Administered 2016-04-24: 2.5 mg via ORAL
  Filled 2016-04-24: qty 1

## 2016-04-24 MED ORDER — OSELTAMIVIR PHOSPHATE 75 MG PO CAPS
75.0000 mg | ORAL_CAPSULE | Freq: Two times a day (BID) | ORAL | Status: DC
  Administered 2016-04-24 (×2): 75 mg via ORAL
  Filled 2016-04-24 (×2): qty 1

## 2016-04-24 MED ORDER — DOLUTEGRAVIR SODIUM 50 MG PO TABS
50.0000 mg | ORAL_TABLET | Freq: Every day | ORAL | Status: DC
  Administered 2016-04-24: 50 mg via ORAL
  Filled 2016-04-24: qty 1

## 2016-04-24 MED ORDER — EMTRICITABINE-TENOFOVIR AF 200-25 MG PO TABS
1.0000 | ORAL_TABLET | Freq: Every day | ORAL | Status: DC
  Administered 2016-04-24: 1 via ORAL
  Filled 2016-04-24: qty 1

## 2016-04-24 MED ORDER — ALBUTEROL SULFATE (2.5 MG/3ML) 0.083% IN NEBU
2.5000 mg | INHALATION_SOLUTION | RESPIRATORY_TRACT | Status: DC | PRN
Start: 2016-04-24 — End: 2016-04-24

## 2016-04-24 MED ORDER — DM-GUAIFENESIN ER 30-600 MG PO TB12
1.0000 | ORAL_TABLET | Freq: Two times a day (BID) | ORAL | Status: DC
  Administered 2016-04-24 (×2): 1 via ORAL
  Filled 2016-04-24 (×2): qty 1

## 2016-04-24 NOTE — ED Notes (Signed)
MD at bedside. 

## 2016-04-24 NOTE — Discharge Summary (Signed)
[name] JXB:[id number] DOB: 1970-04-05 DOA: 04/23/2016  PCP: Renold Don, MD  Admit date: 04/23/2016  Discharge date: 04/24/2016  Admitted From: Home  Disposition:  Home   Recommendations for Outpatient Follow-up:   Follow up with PCP in 1-2 weeks  PCP Please obtain BMP/CBC, 2 view CXR in 1week,  (see Discharge instructions)   PCP Please follow up on the following pending results: HIV RNA Quant   Home Health: None   Equipment/Devices: None  Consultations: ID over phone Discharge Condition: Fair   CODE STATUS: Full   Diet Recommendation:  Heart Healthy    Chief Complaint  Patient presents with  . Cough  . Generalized Body Aches     Brief history of present illness from the day of admission and additional interim summary    [name] is a 46 y.o. male with medical history significant of tuberculosis, HIV/AIDS (CD4 50 on 04/16/16 and VL 54,000 on 02/25/16), PCP, hepatitis, PE on Eliquis, chronic systolic CHF with EF of 45-50 percent, depression, who presents with cough, body aches and shortness of breath due to influenza infection.                                                                 Hospital Course   1. Influenza A infection in a patient with HIV/AIDS. Questionable compliance. Placed on Tamiflu, this morning feels better, he is not short of breath or hypoxic, he would rather be discharged home with Tamiflu which will be done, he will get Tamiflu course for 5 days total. He will continue his antiretroviral medications along with prophylactic antibiotics unchanged as before. He has been instructed to follow with his PCP and primary ID physician Dr. [name] within a week. Note HIV RNA viral load from 1 week ago drawn in the office is still pending, likely sample has been lost, this test was  reordered to be followed at the ID office.  Note in my interview patient says she has been taking his HIV medications and prophylactic antibiotics and has been compliant.  Component Value Date   CD4TCELL 4 (L) 02/25/2016        Lab Results  Component Value Date   CD4TABS 20 (L) 02/25/2016   CD4TABS 60 (L) 03/22/2014   CD4TABS 68 (L) 10/26/2013        Lab Results  Component Value Date   HIV1RNAQUANT 54,000 02/25/2016    2. Recent history of PE. Continue Eliquis.  3. Chronic systolic CHF. EF around 45-50% on recent echocardiogram. Compensated. No edema or fluid overload on exam.  4. Mildly care due to dehydration. Has been hydrated request PCP to repeat BMP next visit.  5. AIDS induced cachexia and severe PCM - placed on protein supplementation.  Discharge diagnosis     Principal Problem:  Acute respiratory failure with hypoxia ([name]) Active Problems:   HIV disease ([name])   AIDS ([name])   PE (pulmonary thromboembolism) ([name])   Protein-calorie malnutrition, severe   PCP (pneumocystis carinii pneumonia) ([name])   Chronic systolic CHF (congestive heart failure) ([name])   AKI (acute kidney injury) ([name])   Flu    Discharge instructions    Discharge Instructions    Diet - low sodium heart healthy    Complete by:  As directed    Discharge instructions    Complete by:  As directed    Follow with Primary MD WALDEN,JEFF, MD in 7 days   Get CBC, CMP, 2 view Chest X ray checked  by Primary MD or SNF MD in 5-7 days ( we routinely change or add medications that can affect your baseline labs and fluid status, therefore we recommend that you get the mentioned basic workup next visit with your PCP, your PCP may decide not to get them or add new tests based on their clinical decision)  Activity: As tolerated with Full fall precautions use walker/cane & assistance as needed  Disposition Home    Diet: Heart Healthy    For Heart failure patients - Check your Weight same time  everyday, if you gain over 2 pounds, or you develop in leg swelling, experience more shortness of breath or chest pain, call your Primary MD immediately. Follow Cardiac Low Salt Diet and 1.5 lit/day fluid restriction.  On your next visit with your primary care physician please Get Medicines reviewed and adjusted.  Please request your Prim.MD to go over all Hospital Tests and Procedure/Radiological results at the follow up, please get all Hospital records sent to your Prim MD by signing hospital release before you go home.  If you experience worsening of your admission symptoms, develop shortness of breath, life threatening emergency, suicidal or homicidal thoughts you must seek medical attention immediately by calling 911 or calling your MD immediately  if symptoms less severe.  You Must read complete instructions/literature along with all the possible adverse reactions/side effects for all the Medicines you take and that have been prescribed to you. Take any new Medicines after you have completely understood and accpet all the possible adverse reactions/side effects.   Do not drive, operate heavy machinery, perform activities at heights, swimming or participation in water activities or provide baby sitting services if your were admitted for syncope or siezures until you have seen by Primary MD or a Neurologist and advised to do so again.  Do not drive when taking Pain medications.    Do not take more than prescribed Pain, Sleep and Anxiety Medications  Special Instructions: If you have smoked or chewed Tobacco  in the last 2 yrs please stop smoking, stop any regular Alcohol  and or any Recreational drug use.  Wear Seat belts while driving.   Please note  You were cared for by a hospitalist during your hospital stay. If you have any questions about your discharge medications or the care you received while you were in the hospital after you are discharged, you can call the unit and asked to  speak with the hospitalist on call if the hospitalist that took care of you is not available. Once you are discharged, your primary care physician will handle any further medical issues. Please note that NO REFILLS for any discharge medications will be authorized once you are discharged, as it is imperative that you return to your primary care physician (or establish a relationship  with a primary care physician if you do not have one) for your aftercare needs so that they can reassess your need for medications and monitor your lab values.   Increase activity slowly    Complete by:  As directed       Discharge Medications   Allergies as of 04/24/2016   No Known Allergies     Medication List    TAKE these medications   apixaban 2.5 MG Tabs tablet Commonly known as:  ELIQUIS Take 1 tablet (2.5 mg total) by mouth 2 (two) times daily.   azithromycin 600 MG tablet Commonly known as:  ZITHROMAX Take 1 tablet (600 mg total) by mouth every 7 (seven) days.   darunavir-cobicistat 800-150 MG tablet Commonly known as:  PREZCOBIX Take 1 tablet by mouth daily with breakfast. Swallow whole. Do NOT crush, break or chew tablets. Take with food.   dolutegravir 50 MG tablet Commonly known as:  TIVICAY Take 1 tablet (50 mg total) by mouth daily.   emtricitabine-tenofovir AF 200-25 MG tablet Commonly known as:  DESCOVY Take 1 tablet by mouth daily.   feeding supplement Liqd Take 1 Container by mouth 3 (three) times daily between meals.   guaiFENesin-dextromethorphan 100-10 MG/5ML syrup Commonly known as:  ROBITUSSIN DM Take 5 mLs by mouth every 4 (four) hours as needed for cough.   loratadine 10 MG tablet Commonly known as:  CLARITIN Take 1 tablet (10 mg total) by mouth daily.   methocarbamol 500 MG tablet Commonly known as:  ROBAXIN Take 1 tablet (500 mg total) by mouth every 6 (six) hours as needed for muscle spasms.   ondansetron 4 MG tablet Commonly known as:  ZOFRAN Take 1 tablet (4  mg total) by mouth every 8 (eight) hours as needed for nausea or vomiting.   oseltamivir 75 MG capsule Commonly known as:  TAMIFLU Take 1 capsule (75 mg total) by mouth 2 (two) times daily.   oxyCODONE-acetaminophen 10-325 MG tablet Commonly known as:  PERCOCET Take 1 tablet by mouth every 6 (six) hours as needed.   sulfamethoxazole-trimethoprim 800-160 MG tablet Commonly known as:  BACTRIM DS,SEPTRA DS Take 1 tablet by mouth daily.       Follow-up Information    WALDEN,JEFF, MD. Schedule an appointment as soon as possible for a visit in 1 week(s).   Specialty:  Family Medicine Contact information: [address] [place] [address] [phone]        Judyann Munson, MD. Schedule an appointment as soon as possible for a visit in 1 week(s).   Specialty:  Infectious Diseases Contact information: [address] [place] [address] [phone]           Major procedures and Radiology Reports - PLEASE review detailed and final reports thoroughly  -        Dg Chest 2 View  Result Date: 04/23/2016 CLINICAL DATA:  Gradually worsening cough for 3 days. History of HIV and tuberculosis, pulmonary embolism. EXAM: CHEST  2 VIEW COMPARISON:  CT chest February 24, 2016 FINDINGS: Cardiomediastinal silhouette is normal. Cystic changes LEFT lung corresponding to bronchiectasis. RIGHT upper lobe scarring. No pleural effusion. No pneumothorax. Soft tissue planes included osseous structures are normal. IMPRESSION: Multifocal scarring and bronchiectasis without definite acute cardiopulmonary process though limited assessment given degree of baseline parenchymal abnormality. Electronically Signed   By: Awilda Metro M.D.   On: 04/23/2016 21:25    Micro Results     Recent Results (from the past 240  hour(s))  Culture, blood (Routine X 2) w Reflex to ID Panel     Status: None (Preliminary result)   Collection Time: 04/23/16 11:29 PM  Result Value Ref  Range Status   Specimen Description BLOOD RIGHT ARM  Final   Special Requests BOTTLES DRAWN AEROBIC ONLY  Final   Culture PENDING  Incomplete   Report Status PENDING  Incomplete    Today   Subjective    [name] today has no headache,no chest abdominal pain,no new weakness tingling or numbness, feels much better wants to go home today.     Objective   Blood pressure 101/61, pulse 80, temperature 97.6 F (36.4 C), temperature source Oral, resp. rate 18, height 6\' 3"  (1.905 m), weight 62.4 kg (137 lb 8 oz), SpO2 93 % RA   Intake/Output Summary (Last 24 hours) at 04/24/16 1009 Last data filed at 04/24/16 0900  Gross per 24 hour  Intake          1795.83 ml  Output              725 ml  Net          1070.83 ml    Exam Frail cachectic African-American male sitting in hospital bed in no distress  Awake Alert, Oriented x 3, No new F.N deficits, Normal affect [place].AT,PERRAL Supple Neck,No JVD, No cervical lymphadenopathy appriciated.  Symmetrical Chest wall movement, Good air movement bilaterally, CTAB RRR,No Gallops,Rubs or new Murmurs, No Parasternal Heave +ve B.Sounds, Abd Soft, Non tender, No organomegaly appriciated, No rebound -guarding or rigidity. No Cyanosis, Clubbing or edema, No new Rash or bruise   Data Review   CBC w Diff:  Lab Results  Component Value Date   WBC 8.4 04/23/2016   HGB 12.7 (L) 04/23/2016   HCT 37.9 (L) 04/23/2016   PLT 116 (L) 04/23/2016   LYMPHOPCT 23 04/23/2016   MONOPCT 10 04/23/2016   EOSPCT 0 04/23/2016   BASOPCT 0 04/23/2016    CMP:  Lab Results  Component Value Date   NA 134 (L) 04/23/2016   K 3.6 04/23/2016   CL 101 04/23/2016   CO2 23 04/23/2016   BUN 18 04/23/2016   CREATININE 1.28 (H) 04/23/2016   CREATININE 1.15 04/16/2016   PROT 8.7 (H) 04/23/2016   ALBUMIN 3.4 (L) 04/23/2016   BILITOT 0.9 04/23/2016   ALKPHOS 59 04/23/2016   AST 62 (H) 04/23/2016   ALT 31 04/23/2016  .   Total Time in preparing paper  work, data evaluation and todays exam - 35 minutes  Leroy Sea M.D on 04/24/2016 at 10:09 AM  [facility]   Office  [phone]

## 2016-04-24 NOTE — H&P (Signed)
History and Physical    SENECA HOBACK WJX:[id number] DOB: [birth year] DOA: 04/23/2016  Referring MD/NP/PA:   PCP: Renold Don, MD   Patient coming from:  The patient is coming from home.  At baseline, pt is independent for most of ADL.   Chief Complaint: Cough, body aches, SOB  HPI: [name] is a 46 y.o. male with medical history significant of tuberculosis, HIV/AIDS (CD4 50 on 04/16/16 and VL 54,000 on 02/25/16), PCP, hepatitis, PE on Eliquis, chronic systolic CHF with EF of 45-50 percent, depression, who presents with cough, body aches and shortness of breath.  Patient states that she has cough, body aches, mild shortness of breath for 2 days. He does not have fever, chills, chest pain. No runny nose or sore throat. He does not have sputum production. He also has headache, but no neck rigidity. He has nausea, and vomited once, denies diarrhea or abdominal pain. No symptoms of UTI or unilateral weakness.  ED Course: pt was found to have LDH 296, WBC 8.8, ABG with pH of 7.45, PCO2 35.3, PO2 65. AKI with creatinine 1.28, temperature 99.9, tachycardia, oxygen section 96% on room air, pending flu PCR. Pt is admitted to tele bed as inpt.  Review of Systems:   General: no fevers, chills, no changes in body weight, has poor appetite, has fatigue HEENT: no blurry vision, hearing changes or sore throat Respiratory: has dyspnea, coughing, no wheezing CV: no chest pain, no palpitations GI: has nausea, vomiting, no abdominal pain, diarrhea, constipation GU: no dysuria, burning on urination, increased urinary frequency, hematuria  Ext: no leg edema Neuro: no unilateral weakness, numbness, or tingling, no vision change or hearing loss Skin: no rash, no skin tear. MSK: No muscle spasm, no deformity, no limitation of range of movement in spin Heme: No easy bruising.  Travel history: No recent long distant travel.  Allergy: No Known Allergies  Past Medical History:  Diagnosis Date  .  Depression    "stress and depression for any man is common" (03/21/2014)  . Hepatitis    "I don't know what hepatitis I have"  . HIV disease ([name])   . TB (pulmonary tuberculosis)    previously treated according to refugee documentation    Past Surgical History:  Procedure Laterality Date  . FRACTURE SURGERY    . IM NAILING TIBIA Right 03/21/2014  . ORIF ANKLE FRACTURE Right 03/21/2014   lateral malleolus/notes 03/21/2014  . ORIF ANKLE FRACTURE Right 03/21/2014   Procedure: OPEN REDUCTION INTERNAL FIXATION (ORIF) pilon ;  Surgeon: Eldred Manges, MD;  Location: [facility] OR;  Service: Orthopedics;  Laterality: Right;  . TIBIA IM NAIL INSERTION Right 03/21/2014   Procedure: INTRAMEDULLARY (IM) NAIL TIBIAL;  Surgeon: Eldred Manges, MD;  Location: [facility] OR;  Service: Orthopedics;  Laterality: Right;  Marland Kitchen VIDEO BRONCHOSCOPY Bilateral 03/02/2016   Procedure: VIDEO BRONCHOSCOPY WITHOUT FLUORO;  Surgeon: Roslynn Amble, MD;  Location: [facility] ENDOSCOPY;  Service: Cardiopulmonary;  Laterality: Bilateral;    Social History:  reports that he quit smoking about 15 years ago. His smoking use included Cigarettes. He has a 3.00 pack-year smoking history. He has never used smokeless tobacco. He reports that he drinks alcohol. He reports that he does not use drugs.  Family History:  Family History  Problem Relation Age of Onset  . Hypertension Other   . Heart disease Sister      Prior to Admission medications   Medication Sig Start Date End Date Taking? Authorizing Provider  apixaban Everlene Balls)  2.5 MG TABS tablet Take 1 tablet (2.5 mg total) by mouth 2 (two) times daily. 03/18/16   Tillman SersAngela C Riccio, DO  azithromycin (ZITHROMAX) 600 MG tablet Take 1 tablet (600 mg total) by mouth every 7 (seven) days. 04/16/16   Judyann Munsonynthia Snider, MD  darunavir-cobicistat (PREZCOBIX) 800-150 MG tablet Take 1 tablet by mouth daily with breakfast. Swallow whole. Do NOT crush, break or chew tablets. Take with food. 03/03/16   Randall Hissornelius N Van Dam, MD    dolutegravir (TIVICAY) 50 MG tablet Take 1 tablet (50 mg total) by mouth daily. 03/03/16   Randall Hissornelius N Van Dam, MD  emtricitabine-tenofovir AF (DESCOVY) 200-25 MG tablet Take 1 tablet by mouth daily. 04/17/16   Judyann Munsonynthia Snider, MD  guaiFENesin-dextromethorphan [facility](ROBITUSSIN DM) 100-10 MG/5ML syrup Take 5 mLs by mouth every 4 (four) hours as needed for cough. 12/11/13   Judyann Munsonynthia Snider, MD  loratadine (CLARITIN) 10 MG tablet Take 1 tablet (10 mg total) by mouth daily. 03/12/16   Minh Q Pham, RPH  methocarbamol (ROBAXIN) 500 MG tablet Take 1 tablet (500 mg total) by mouth every 6 (six) hours as needed for muscle spasms. 03/23/14   Naida SleightJames M Owens, PA-C  ondansetron (ZOFRAN) 4 MG tablet Take 1 tablet (4 mg total) by mouth every 8 (eight) hours as needed for nausea or vomiting. 03/12/16   Nicholes CalamityMinh Q Pham, RPH  oxyCODONE-acetaminophen (PERCOCET) 10-325 MG per tablet Take 1 tablet by mouth every 6 (six) hours as needed. 03/23/14   Naida SleightJames M Owens, PA-C  sulfamethoxazole-trimethoprim (BACTRIM DS,SEPTRA DS) 800-160 MG tablet Take 1 tablet by mouth daily. 03/04/16   Randall Hissornelius N Van Dam, MD    Physical Exam: Vitals:   04/23/16 1636 04/23/16 1943  BP: 126/86 119/96  Pulse: 116 110  Resp: 16 16  Temp: 99.9 F (37.7 C) 99.5 F (37.5 C)  TempSrc: Oral Oral  SpO2:  96%   General: Not in acute distress HEENT:       Eyes: PERRL, EOMI, no scleral icterus.       ENT: No discharge from the ears and nose, no pharynx injection, no tonsillar enlargement.        Neck: No JVD, no bruit, no mass felt. Heme: No neck lymph node enlargement. Cardiac: S1/S2, RRR, No murmurs, No gallops or rubs. Respiratory: Decreased air movement bilaterally. No rales, wheezing, rhonchi or rubs. GI: Soft, nondistended, nontender, no rebound pain, no organomegaly, BS present. GU: No hematuria Ext: No pitting leg edema bilaterally. 2+DP/PT pulse bilaterally. Musculoskeletal: No joint deformities, No joint redness or warmth, no limitation of ROM in  spin. Skin: No rashes.  Neuro: Alert, oriented X3, cranial nerves II-XII grossly intact, moves all extremities normally.  Psych: Patient is not psychotic, no suicidal or hemocidal ideation.  Labs on Admission: I have personally reviewed following labs and imaging studies  CBC:  Recent Labs Lab 04/23/16 2121  WBC 8.4  NEUTROABS 5.6  HGB 12.7*  HCT 37.9*  MCV 101.9*  PLT 116*   Basic Metabolic Panel:  Recent Labs Lab 04/23/16 2121  NA 134*  K 3.6  CL 101  CO2 23  GLUCOSE 121*  BUN 18  CREATININE 1.28*  CALCIUM 9.6   GFR: Estimated Creatinine Clearance: 66.9 mL/min (by C-G formula based on SCr of 1.28 mg/dL (H)). Liver Function Tests:  Recent Labs Lab 04/23/16 2121  AST 62*  ALT 31  ALKPHOS 59  BILITOT 0.9  PROT 8.7*  ALBUMIN 3.4*   No results for input(s): LIPASE, AMYLASE in the  last 168 hours. No results for input(s): AMMONIA in the last 168 hours. Coagulation Profile: No results for input(s): INR, PROTIME in the last 168 hours. Cardiac Enzymes: No results for input(s): CKTOTAL, CKMB, CKMBINDEX, TROPONINI in the last 168 hours. BNP (last 3 results) No results for input(s): PROBNP in the last 8760 hours. HbA1C: No results for input(s): HGBA1C in the last 72 hours. CBG: No results for input(s): GLUCAP in the last 168 hours. Lipid Profile: No results for input(s): CHOL, HDL, LDLCALC, TRIG, CHOLHDL, LDLDIRECT in the last 72 hours. Thyroid Function Tests: No results for input(s): TSH, T4TOTAL, FREET4, T3FREE, THYROIDAB in the last 72 hours. Anemia Panel: No results for input(s): VITAMINB12, FOLATE, FERRITIN, TIBC, IRON, RETICCTPCT in the last 72 hours. Urine analysis:    Component Value Date/Time   COLORURINE YELLOW 02/25/2016 0043   APPEARANCEUR CLEAR 02/25/2016 0043   LABSPEC <1.005 (L) 02/25/2016 0043   PHURINE 7.0 02/25/2016 0043   GLUCOSEU NEGATIVE 02/25/2016 0043   HGBUR NEGATIVE 02/25/2016 0043   BILIRUBINUR NEGATIVE 02/25/2016 0043    KETONESUR NEGATIVE 02/25/2016 0043   PROTEINUR NEGATIVE 02/25/2016 0043   UROBILINOGEN 1.0 03/23/2014 1400   NITRITE NEGATIVE 02/25/2016 0043   LEUKOCYTESUR NEGATIVE 02/25/2016 0043   Sepsis Labs: @LABRCNTIP (procalcitonin:4,lacticidven:4) )No results found for this or any previous visit (from the past 240 hour(s)).   Radiological Exams on Admission: Dg Chest 2 View  Result Date: 04/23/2016 CLINICAL DATA:  Gradually worsening cough for 3 days. History of HIV and tuberculosis, pulmonary embolism. EXAM: CHEST  2 VIEW COMPARISON:  CT chest February 24, 2016 FINDINGS: Cardiomediastinal silhouette is normal. Cystic changes LEFT lung corresponding to bronchiectasis. RIGHT upper lobe scarring. No pleural effusion. No pneumothorax. Soft tissue planes included osseous structures are normal. IMPRESSION: Multifocal scarring and bronchiectasis without definite acute cardiopulmonary process though limited assessment given degree of baseline parenchymal abnormality. Electronically Signed   By: Awilda Metro M.D.   On: 04/23/2016 21:25     EKG: Not done in Ed.  Assessment/Plan Principal Problem:   Acute respiratory failure with hypoxia ([name]) Active Problems:   HIV disease ([name])   AIDS ([name])   PE (pulmonary thromboembolism) ([name])   Protein-calorie malnutrition, severe   PCP (pneumocystis carinii pneumonia) ([name])   Chronic systolic CHF (congestive heart failure) ([name])   AKI (acute kidney injury) ([name])   Acute respiratory failure with hypoxia ([name]): Etiology is not clear, but given hiis history of PCP, low CD4, elevated LDH and dry cough, concerning for PCP pneumonia.   - will admit to tele bed as inpt - Begin treatment with oral Bactrim 2 double strength tablets per pharm - Prednisone 50 mg daily - check DFA - start Tamiflu empirically and Follow-up flu PCR - Mucinex for cough  - prn Albuterol Nebs for SOB - S. pneumococcal antigen - Follow up blood culture x2, sputum culture  - check  lactic acid level - IVF: NS 75 cc/h - please call ID in AM  HIV disease and AIDs: CD4 50 on 04/16/16 and VL 54,000 on 02/25/16. Pt was seen by ID, Dr. Drue Second on 04/16/16. -continue home HIV meds -continue azithromycin prophylaxis  PE (pulmonary thromboembolism) ([name]): -continue Eliquis  Chronic systolic CHF: 2-D echo on 02/25/16 showed EF of 45-50 percent. Patient does not have leg edema JVD. CHF is compensated. Patient is not taking diuretics at home. -Check BNP  AKI: Likely due to prerenal secondary to dehydration and continuation of bactrim. Since his AKI is mild and Bactrim is important for possible  PCP, will not hold of bactrim now. - gentle IVF as above - Check FeNa - Follow up renal function by BMP  Protein-calorie malnutrition, severe: -Start Ensure  DVT ppx: SQ Lovenox Code Status: Full code Family Communication: None at bed side.  Disposition Plan:  Anticipate discharge back to previous home environment Consults called:  none Admission status: Inpatient/tele    Date of Service 04/24/2016    Lorretta Harp [facility] Pager [phone]  If 7PM-7AM, please contact night-coverage [email or web address] Password [email or web address] 04/24/2016, 3:01 AM

## 2016-04-24 NOTE — Discharge Instructions (Signed)
Follow with Primary MD WALDEN,JEFF, MD in 7 days   Get CBC, CMP, 2 view Chest X ray checked  by Primary MD or SNF MD in 5-7 days ( we routinely change or add medications that can affect your baseline labs and fluid status, therefore we recommend that you get the mentioned basic workup next visit with your PCP, your PCP may decide not to get them or add new tests based on their clinical decision)  Activity: As tolerated with Full fall precautions use walker/cane & assistance as needed  Disposition Home    Diet: Heart Healthy    For Heart failure patients - Check your Weight same time everyday, if you gain over 2 pounds, or you develop in leg swelling, experience more shortness of breath or chest pain, call your Primary MD immediately. Follow Cardiac Low Salt Diet and 1.5 lit/day fluid restriction.  On your next visit with your primary care physician please Get Medicines reviewed and adjusted.  Please request your Prim.MD to go over all Hospital Tests and Procedure/Radiological results at the follow up, please get all Hospital records sent to your Prim MD by signing hospital release before you go home.  If you experience worsening of your admission symptoms, develop shortness of breath, life threatening emergency, suicidal or homicidal thoughts you must seek medical attention immediately by calling 911 or calling your MD immediately  if symptoms less severe.  You Must read complete instructions/literature along with all the possible adverse reactions/side effects for all the Medicines you take and that have been prescribed to you. Take any new Medicines after you have completely understood and accpet all the possible adverse reactions/side effects.   Do not drive, operate heavy machinery, perform activities at heights, swimming or participation in water activities or provide baby sitting services if your were admitted for syncope or siezures until you have seen by Primary MD or a Neurologist and  advised to do so again.  Do not drive when taking Pain medications.    Do not take more than prescribed Pain, Sleep and Anxiety Medications  Special Instructions: If you have smoked or chewed Tobacco  in the last 2 yrs please stop smoking, stop any regular Alcohol  and or any Recreational drug use.  Wear Seat belts while driving.   Please note  You were cared for by a hospitalist during your hospital stay. If you have any questions about your discharge medications or the care you received while you were in the hospital after you are discharged, you can call the unit and asked to speak with the hospitalist on call if the hospitalist that took care of you is not available. Once you are discharged, your primary care physician will handle any further medical issues. Please note that NO REFILLS for any discharge medications will be authorized once you are discharged, as it is imperative that you return to your primary care physician (or establish a relationship with a primary care physician if you do not have one) for your aftercare needs so that they can reassess your need for medications and monitor your lab values.

## 2016-04-24 NOTE — Progress Notes (Signed)
Used interpreter phone to contact [name] dialect. Interpreter used was [place]. Discharge paperwork went over thoroughly. Pt understands he has paper prescriptions and must take them to any pharmacy, understands he has scheduled follow up appointments, understand the doctor recommends follow up chest xray and blood work. Patient also understands CHF education such as heart healthy diet and weighing himself daily. Pt IV discontinued, catheter intact and telemetry removed. Pt has all belongings, printed prescriptions and discharge paperwork. Pt discharged via wheelchair with nurse tech  Rayan Ines

## 2016-04-24 NOTE — Progress Notes (Signed)
Used interpreter phone to contact [name] dialect, [name] was the translater used to completed patient 1000 medication pass. Interpreter also used to completed patient admission databases.    [name]

## 2016-04-24 NOTE — Progress Notes (Signed)
Patient independent of all of his ADL's no needs identified at this time; CM will continue to follow for DCP; B Shelba FlakeChandler RN,MHA,BSN [phone]

## 2016-04-24 NOTE — Progress Notes (Signed)
Pharmacy Antibiotic Note  [name] is a 46 y.o. male admitted on 04/23/2016 with PCP.  Pharmacy has been consulted for Septra dosing.  Plan: Septra 360mg  IV q8h (~16mg /kg/day) Will f/u micro data, renal function, and pt's clinical condition      Temp (24hrs), Avg:99.7 F (37.6 C), Min:99.5 F (37.5 C), Max:99.9 F (37.7 C)   Recent Labs Lab 04/23/16 2121  WBC 8.4  CREATININE 1.28*    Estimated Creatinine Clearance: 66.9 mL/min (by C-G formula based on SCr of 1.28 mg/dL (H)).    No Known Allergies  Antimicrobials this admission: 2/9 Septra >>   Microbiology results: 2/8 BCx x2:   Sputum:    Thank you for allowing pharmacy to be a part of this patient's care.  Christoper Fabianaron Rada Zegers, PharmD, BCPS Clinical pharmacist, pager [phone] 04/24/2016 2:56 AM

## 2016-04-25 LAB — HIV-1 RNA,QN PCR W/REFLEX GENOTYPE

## 2016-04-25 LAB — HIV-1 RNA ULTRAQUANT REFLEX TO GENTYP+
HIV-1 RNA BY PCR: <20 copies/mL
HIV-1 RNA QUANT, LOG: UNDETERMINED {Log_copies}/mL

## 2016-04-27 LAB — HIV ANTIBODY (ROUTINE TESTING W REFLEX): HIV SCREEN 4TH GENERATION: REACTIVE — AB

## 2016-04-28 LAB — HIV 1/2 AB DIFFERENTIATION
HIV 1 Ab: POSITIVE — AB
HIV 2 Ab: NEGATIVE

## 2016-04-29 ENCOUNTER — Other Ambulatory Visit: Payer: Self-pay | Admitting: Pharmacist Clinician (PhC)/ Clinical Pharmacy Specialist

## 2016-04-29 LAB — CULTURE, BLOOD (ROUTINE X 2)
CULTURE: NO GROWTH
Culture: NO GROWTH

## 2016-04-29 MED ORDER — AZITHROMYCIN 600 MG PO TABS: 1200.0000 mg | ORAL_TABLET | ORAL | 5 refills | Status: DC

## 2016-04-29 NOTE — Progress Notes (Signed)
Change Azith rx to 1200mg  qwk

## 2016-04-30 ENCOUNTER — Ambulatory Visit: Payer: Commercial Managed Care - PPO | Admitting: Family Medicine

## 2016-05-06 LAB — ACID FAST CULTURE WITH REFLEXED SENSITIVITIES (MYCOBACTERIA): Acid Fast Culture: NEGATIVE

## 2016-05-19 ENCOUNTER — Ambulatory Visit: Payer: Commercial Managed Care - PPO

## 2016-05-19 ENCOUNTER — Ambulatory Visit: Payer: Commercial Managed Care - PPO | Admitting: Internal Medicine

## 2016-05-20 ENCOUNTER — Telehealth: Payer: Self-pay | Admitting: *Deleted

## 2016-05-20 ENCOUNTER — Ambulatory Visit: Payer: Commercial Managed Care - PPO | Admitting: Family Medicine

## 2016-05-20 NOTE — Telephone Encounter (Signed)
RN received a call from Dr [name] concerned that [name] may miss his appt today since it is already pass the appointment time. Dr [name] also noted that the patient does not have a upcoming appointment with Dr [name] either. On review of the chart it appears the patient's appt with Dr [name] and the pharmacist for yesterday was cancelled. It does not state a reason why. RN thanked Dr [name] and reassured her that I will be making attempts to reach the patient.  RN contacted [employer] services who contacted the patient for me. Interpreter attempted to call the patient's number without success.   I also located a local number for a daughter Anthony Scot(Kamwanya Lyse), Rn asked the interpreter to please not introduce me to the patient as a nurse from infectious disease. This number did not allow for messages and we did not receive a answer.  We attempted to call the casework [name] without success  We attempted to call the family phone number of [phone] without success.    [name] is working he has told me that he is usually off work by 3 so I will give him to 4 before attempting another call.

## 2016-05-21 ENCOUNTER — Telehealth: Payer: Self-pay | Admitting: *Deleted

## 2016-05-21 ENCOUNTER — Ambulatory Visit: Payer: Self-pay | Admitting: *Deleted

## 2016-05-21 DIAGNOSIS — B20 Human immunodeficiency virus [HIV] disease: Secondary | ICD-10-CM

## 2016-05-21 DIAGNOSIS — I2699 Other pulmonary embolism without acute cor pulmonale: Secondary | ICD-10-CM

## 2016-05-21 NOTE — Telephone Encounter (Signed)
RN contacted [employer] for [employer]ili translation.  We did not receive a message and the voicemail has not been set up yet.  I will arrange a drive by to his home today

## 2016-06-01 ENCOUNTER — Telehealth: Payer: Self-pay | Admitting: *Deleted

## 2016-06-01 ENCOUNTER — Ambulatory Visit (INDEPENDENT_AMBULATORY_CARE_PROVIDER_SITE_OTHER): Payer: Commercial Managed Care - PPO | Admitting: Pharmacist Clinician (PhC)/ Clinical Pharmacy Specialist

## 2016-06-01 DIAGNOSIS — B20 Human immunodeficiency virus [HIV] disease: Secondary | ICD-10-CM | POA: Diagnosis not present

## 2016-06-01 NOTE — Progress Notes (Signed)
I have a copy of his paystubs. Giving the information to [name] in hopes this will speed the ADAP process along

## 2016-06-01 NOTE — Progress Notes (Signed)
HPI: [name] is a 46 y.o. male who is here to meet with pharmacy for his HIV visit.   Allergies: No Known Allergies  Vitals:    Past Medical History: Past Medical History:  Diagnosis Date  . Depression    "stress and depression for any man is common" (03/21/2014)  . Hepatitis    "I don't know what hepatitis I have"  . HIV disease ([name])   . TB (pulmonary tuberculosis)    previously treated according to refugee documentation    Social History: Social History   Social History  . Marital status: Single    Spouse name: N/A  . Number of children: N/A  . Years of education: N/A   Social History Main Topics  . Smoking status: Former Smoker    Packs/day: 0.50    Years: 6.00    Types: Cigarettes    Quit date: 03/16/2001  . Smokeless tobacco: Never Used     Comment: "quit smoking ~ 2003"  . Alcohol use Yes     Comment: drinks bottled beer intermittently  . Drug use: No  . Sexual activity: Not Currently    Partners: Female   Other Topics Concern  . Not on file   Social History Narrative   As of 09/18/2013:   -Arrived in [place]: [place] 05[place] 2012    -[facility], spent [place] years in [place]    -Language: [name], requires Architectwahili intepreter (speaks essentially no [place])   -Education: no formal education, previously worked as a Product/process development scientistdriver   -Contact: family phone number [phone], caseworker is Anthony DonathFa Ri Dar Parsons [phone] (with [employer])   -lives with daughter and three sons   -denies alcohol, drug, tobacco abuse      [facility] (02/28/16):   Patient now admitted to drinking bottled beer. Reports continued tobacco abstinence. Denies any bird or mold exposure. No pets currently. No recent travel.    Previous Regimen:   Current Regimen: Prez/DTG/Descovy  Labs: HIV 1 RNA Quant (copies/mL)  Date Value  02/25/2016 54,000  04/19/2014 69 (H)  03/22/2014 4,634 (H)   CD4 T Cell Abs (/uL)  Date Value  04/16/2016 50 (L)  02/25/2016  20 (L)  03/22/2014 60 (L)   Hepatitis B Surface Ag (no units)  Date Value  09/26/2013 NEGATIVE   HCV Ab (no units)  Date Value  03/22/2014 NEGATIVE    CrCl: CrCl cannot be calculated (Patient's most recent lab result is older than the maximum 21 days allowed.).  Lipids:    Component Value Date/Time   CHOL 121 03/23/2014 0603   TRIG 27 03/23/2014 0603   HDL 37 (L) 03/23/2014 0603   CHOLHDL 3.3 03/23/2014 0603   VLDL 5 03/23/2014 0603   LDLCALC 79 03/23/2014 0603    Assessment: [name] has an appt with pharmacy tomorrow but he shows up today instead. He is doing fantastic on his current regimen. 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 Originally, he agreed to dual therapy with DTG/Prezcobix but he is still on Descovy. Either way, his VL is perfectly suppressed right now. Since he got the routine down, we are going to keep him on Descovy and hope that it still provide some beneficial effect. We handed him his meds today. (DTG/Prez/Descovy/Azith/Septra). His CD4 is still low. We are going to repeat it today.   Asked if he is still working at the same place. He stated that they let him go last  month. He is trying to find another job now. For now, his insurance still works but it probably won't be in the near future. We are going to have to do ADAP in him. He is going to come back next week with his W2 so [name] can start the process for it. His copay was $14 today for this non-HIV meds. Since he doesn't have income, we'll reduce it down to $2.   When I asked him about his apixaban, he said that he is on it. This is being [facility] by [facility]. Will get [name] to see if she can make sure he follows up with [facility] since he missed last appt.   Recommendations:  Cont Prez/DTG/Descovy Cont Apixaban 2.5mg  BID CD4 today See pharmacy back next week and meet with [name] for ADAP Get [name] to see if she can f/u with him about his appt with [facility]  [name], PharmD, BCPS, AAHIVP,  CPP Clinical Infectious Disease Pharmacist [facility] 06/01/2016, 10:37 AM

## 2016-06-02 ENCOUNTER — Ambulatory Visit: Payer: Commercial Managed Care - PPO

## 2016-06-02 LAB — T-HELPER CELL (CD4) - (RCID CLINIC ONLY)
CD4 % Helper T Cell: 2 % — ABNORMAL LOW (ref 33–55)
CD4 T CELL ABS: 30 /uL — AB (ref 400–2700)

## 2016-06-03 ENCOUNTER — Telehealth: Payer: Self-pay | Admitting: *Deleted

## 2016-06-04 ENCOUNTER — Telehealth: Payer: Self-pay | Admitting: *Deleted

## 2016-06-04 NOTE — Telephone Encounter (Signed)
RN was cc'ed on a visit note stating the patient has been terminated from his employment and will be without insurance coverage. We would like to prepare by enrolling the patient in ADAP benefits and I was asked to see when the patient's current benefits expire.  RN contacted the [employer] insurance but was unable to get the information. Currently the patient has his medications but his matter still needs to be addressed. RN did have a copy of 2 old paystubs to start the process for ADAP. The information was provided to our financial counselor but the paystubs do not have the needed information

## 2016-06-04 NOTE — Telephone Encounter (Signed)
RN called Ms Robin with [employer] in inquire about a end date of coverage for MZella Ballr. Aretha ParrotKaboya.  Zella BallRobin stated [name] has NOT been terminated but is currently on a leave of absence. He is due to return back to work on March 28th 2018. Zella BallRobin did state that during the leave [name] has not paid his insurance premiums and needs to get in contact with her to pay the premiums on his insurance.  RN contacted [employer] for swahili interpreter Interpreter/[id number] contacted Mr Aretha ParrotKaboya without an answer and phone does not allow for voicemails

## 2016-06-04 NOTE — Telephone Encounter (Addendum)
RN spoke with Prescription Coverage department for Mr [name] and the representative stated the do not have an enddate for [name] in their system. Currently his insurance coverage showed active effective 03/17/15. [name]'s employer will have to provide documentation 1st of termination and that has not been given to the insurance company.  RN contacted [employer] at [phone] and was information that Iwould need to contacted Lodema PilotRobin Parsons at [phone] to inquire about insurance coverage  RN contact [name] who stated she was currently in with another person. She took my name and number and informed me she would have to give me a call back. She also asked who this was in reference to so [name]'s name was given at this time. Waiting on a return call from Ms. [name] in reference to a end date of insurance coverage for [name]

## 2016-06-10 ENCOUNTER — Ambulatory Visit: Payer: Commercial Managed Care - PPO

## 2016-06-22 ENCOUNTER — Telehealth: Payer: Self-pay | Admitting: *Deleted

## 2016-06-22 NOTE — Telephone Encounter (Signed)
Rn was able to speak with the patient who stated his is feeling much better. RN would like to f/u with the patient in person to be sure he has his full regimen, f/u appt with Dr [facility] and Dr Drue Second. Visit planned for tomorrow. Pt confirmed a AM time

## 2016-06-23 ENCOUNTER — Telehealth: Payer: Self-pay | Admitting: *Deleted

## 2016-06-23 ENCOUNTER — Ambulatory Visit: Payer: Self-pay | Admitting: *Deleted

## 2016-06-23 ENCOUNTER — Telehealth: Payer: Self-pay | Admitting: Family Medicine

## 2016-06-23 VITALS — BP 118/86 | Wt 148.0 lb

## 2016-06-23 DIAGNOSIS — I2699 Other pulmonary embolism without acute cor pulmonale: Secondary | ICD-10-CM

## 2016-06-23 DIAGNOSIS — B20 Human immunodeficiency virus [HIV] disease: Secondary | ICD-10-CM

## 2016-06-23 NOTE — Telephone Encounter (Addendum)
Just wanted to follow back up with you on Mr. Haugan. After we spoke on the 7th of March, I attempted to f/u with Mr Monteverde and was told by a family member that Wendling was on vacation. I finally got a hold of him yesterday and made a visit with him today. He did confirm that he was in [place] for a while and is currently out of his Eliquis. He has all of HIV medications in his home. I contacted the pharmacy about the Eliquis and they state he is 54 days past due for a refill. I requested a refill and will deliver it to him today.

## 2016-06-23 NOTE — Progress Notes (Signed)
RN made a home visit with Mr [name] today. He states he is feeling much better. He states while he was sick he did have some SOB which has went away and he is not longer coughing or producing mucus. Mr [name] states he is back working and would like to wait until after Friday to know which days he will have available for appointments. While in the home RN reviewed all medications with Mr [name] and noted he is missing his Eliquis. RN to f/u on getting the medication filled for Mr [name]

## 2016-06-23 NOTE — Telephone Encounter (Signed)
RN contacted Mr Kamel through [employer]pre[employer](Sarah ID # [phone]). RN informed Mr Schreifels that [facility] has filled his Eliquis and I can bring the medication to him to try and ensure adherence to medications. Mr Penson stated he will be home and expecting my arrival

## 2016-06-23 NOTE — Telephone Encounter (Signed)
  Patient without Eliquis >50 days per pharmacy, this is refilled and brought to him by Lind Covert (ID nurse coordinator). Will try to make him follow up appointment at [facility] with me as he missed his last visit due to being in [place].

## 2016-06-25 NOTE — Telephone Encounter (Signed)
Thank you for watching voer him.

## 2016-07-01 NOTE — Progress Notes (Signed)
RN has made several attempt to reach the patient by phone without success. RN traveled to the patient's home and was able to speak with a family member. The family member stated [name] is currently on vacation and not available. RN thanked the family member for his time and asked him to please have [name] give me a call once he is available

## 2016-07-14 NOTE — Progress Notes (Signed)
After coordinating medication refill, RN traveled to pick the medication up(Eliquis) and delivered directly to the patient to ensure medication adherence. Patient has a PE and has been without this blood thinner(Eliquis) for over 50 days.  Patient thanked me for getting his needed medications

## 2016-07-23 ENCOUNTER — Ambulatory Visit: Payer: Self-pay | Admitting: *Deleted

## 2016-07-23 DIAGNOSIS — B20 Human immunodeficiency virus [HIV] disease: Secondary | ICD-10-CM

## 2016-08-04 ENCOUNTER — Telehealth: Payer: Self-pay | Admitting: *Deleted

## 2016-08-04 NOTE — Telephone Encounter (Signed)
RN contacted Mr [name] and arrranged a home visit for tomorrow with him

## 2016-08-05 ENCOUNTER — Ambulatory Visit: Payer: Self-pay | Admitting: *Deleted

## 2016-08-05 VITALS — BP 120/72

## 2016-08-05 DIAGNOSIS — T730XXA Starvation, initial encounter: Secondary | ICD-10-CM

## 2016-08-05 DIAGNOSIS — B20 Human immunodeficiency virus [HIV] disease: Secondary | ICD-10-CM

## 2016-08-19 NOTE — Progress Notes (Signed)
Received a message from Cassie/[facility] Pharmacist stating she spoke with the patient and he would like to see me. RN attempted to reach the patient by phone without success. RN traveled to the home did not receive an answer after knocking on the door several times

## 2016-08-20 NOTE — Progress Notes (Signed)
Traveled to [facility] and retrieved several food choices for the patient. Choices included proteins, vegetables, and starches. RN transported the food back to the patient who was very appreciative

## 2016-08-20 NOTE — Progress Notes (Signed)
RN made a home visit with Mr [name] today. He states he is feeling much better. He states he knows the medication is strong because at times it makes him feel sick. RN questioned if the patient is eating with his medications and he stated he is trying to but at times does not have enough food. RN agreed to get food and bring it back to him. While in the home RN reviewed all medications with Mr [name] and he currently has all of his medications. RN will f/u with the patient today after getting him some food

## 2016-08-20 NOTE — Patient Instructions (Signed)
Medication management and adherence. RN to return with food for the patient

## 2016-08-24 ENCOUNTER — Ambulatory Visit: Payer: Commercial Managed Care - PPO | Admitting: Internal Medicine

## 2016-08-24 NOTE — Telephone Encounter (Signed)
Order received on 06/23/16 by Dr. Judyann Munsonynthia Snider  To perform a 1d1 to re- evaluate patient for [facility]). Initial Contact was made during the patient's hospitalization on 03/02/2016 Patient was evaluated in the home on 06/23/16 for [facility]. Assistance is still needed with coordination of care and medication adherence  Frequency / Duration of [facility] visits: Effective 06/23/16 421mo1, 412mo3,  3 PRN's for complications with disease process/progression, medication changes or concerns   [facility] will assess for learning needs related to diagnosis and treatment regimen, provide education as needed, fill pill box if needed, address any barriers which may be preventing medication compliance, and communicating with care team including physician and case manager.   Individualized Plan Of Care, Certification Period from 06/23/16 to 09/21/2016  a. Type of service(s) and care to be delivered: RN Case Management  b. Frequency and duration of service: Effective 06/23/16: 401mo1, 162mo3,  3 prns for complications with disease process/progression, medication changes or concerns . Visits/Contact may be conducted telephonically or in person to best suit  the patient. [name] works from 7 to after 5 and financially he may not be able to be home for visits but could benefit from my additional assistance.  c. Activity restrictions: Pt may be up as tolerated and can safely ambulate without the need for a assistive device   d. Safety Measures: Standard Precautions/Infection Control   e. Service Objectives and Goals: Service Objectives are to assist the pt with HIV medication regimen adherence and staying in care with the Infectious  Disease Clinic by identifying barriers to care. RN will address the barriers  that  are identified by the  patient. Patient centered goal is to stay healthy and continue to become stronger.  f. Equipment required: No additional equipment needs at this time   g.  Functional Limitations: No functional limitation noted or expressed   h. Rehabilitation potential: Guarded   i. Diet and Nutritional Needs: Regular Diet   j. Medications and treatments: Medications have been reconciled and reviewed and are a part of EPIC electronic file   k. Specific therapies if needed: RN   l. Pertinent diagnoses: HIV disease,  Hx of medication NonCompliance, Pulmonary Embolism   m. Expected outcome: Guarded

## 2016-09-03 ENCOUNTER — Telehealth: Payer: Self-pay | Admitting: *Deleted

## 2016-09-03 NOTE — Telephone Encounter (Signed)
Contacted [employer] and spoke with a representative. RN questioned if the patient just needs to renew his food stamp benefits or if a new application must be submitted. I could not get the information because I do not have the patient's full social security number. RN contacted the patient through [employer]/Christine [phone] but was unable to reach the patient by phone. No option for VM

## 2016-09-14 ENCOUNTER — Ambulatory Visit: Payer: Commercial Managed Care - PPO | Admitting: Internal Medicine

## 2016-09-18 ENCOUNTER — Other Ambulatory Visit: Payer: Self-pay | Admitting: Infectious Disease

## 2016-09-18 NOTE — Telephone Encounter (Signed)
Needs office visit.

## 2016-09-22 ENCOUNTER — Telehealth: Payer: Self-pay | Admitting: *Deleted

## 2016-10-09 ENCOUNTER — Telehealth: Payer: Self-pay | Admitting: *Deleted

## 2016-10-09 NOTE — Telephone Encounter (Signed)
Attempted to reach the patient today without success. Unable to leave a voicemessage. I have been attempted to reach the patient for a while without success. [facility] to see if the patient is getting medications refilled. Eliquis, Tivicay and Descovy were all refilled on 7/6 and per pharmacy patient has been pretty consistent with getting medications refilled.

## 2016-10-09 NOTE — Telephone Encounter (Signed)
PATIENT ON HOLD  Plan of Care orders have expired effective 09/22/16  but GOALS have not been completely meet at this time. I would like to connect with the patient and received further orders from MD. If I am unable to get in contact with her within the next 30 days I will have to discharge at that time. RN WILL NOT RESUME CARE UNTIL NEW MD ORDERS OBTAINED AND PATIENT ABLE/WILLING TO RE-ENGAGE IN CARE

## 2016-10-13 ENCOUNTER — Other Ambulatory Visit: Payer: Self-pay | Admitting: Infectious Disease

## 2016-10-13 ENCOUNTER — Other Ambulatory Visit: Payer: Self-pay | Admitting: Pharmacist Clinician (PhC)/ Clinical Pharmacy Specialist

## 2016-10-13 ENCOUNTER — Other Ambulatory Visit: Payer: Self-pay | Admitting: Internal Medicine

## 2016-10-13 DIAGNOSIS — B20 Human immunodeficiency virus [HIV] disease: Secondary | ICD-10-CM

## 2016-10-23 ENCOUNTER — Telehealth: Payer: Self-pay | Admitting: *Deleted

## 2016-11-06 ENCOUNTER — Other Ambulatory Visit: Payer: Self-pay | Admitting: Family Medicine

## 2016-11-13 ENCOUNTER — Telehealth: Payer: Self-pay | Admitting: *Deleted

## 2016-11-13 NOTE — Telephone Encounter (Signed)
RN contacted and spoke with [name]. I would like to make a home visit to assess his current needs and to ensure he is staying on schedule with his medications. Mr [name] agreed to a visit next week.

## 2016-11-18 ENCOUNTER — Telehealth: Payer: Self-pay | Admitting: *Deleted

## 2016-11-18 ENCOUNTER — Ambulatory Visit: Payer: Self-pay | Admitting: *Deleted

## 2016-11-18 DIAGNOSIS — B20 Human immunodeficiency virus [HIV] disease: Secondary | ICD-10-CM

## 2016-11-18 NOTE — Telephone Encounter (Signed)
Contacted Interpreter services(Mr [name]) and was able to reach [name]. Mr [name] stated he was off on Mon and tues but is working today and will not be available for a visit. During this conversation the patient was able to confirm that he continues to take and pick up his medications, he has food to eat and he can be arrange his work schedule to see Dr [name] on Sept 17th at 3:45 for a office visit.

## 2016-11-23 NOTE — Telephone Encounter (Signed)
OK perfect, thank you.  Let me know how he is!

## 2016-11-30 ENCOUNTER — Ambulatory Visit (INDEPENDENT_AMBULATORY_CARE_PROVIDER_SITE_OTHER): Payer: Commercial Managed Care - PPO | Admitting: Internal Medicine

## 2016-11-30 ENCOUNTER — Encounter: Payer: Self-pay | Admitting: Internal Medicine

## 2016-11-30 VITALS — BP 87/57 | HR 116 | Temp 98.9°F | Wt 156.1 lb

## 2016-11-30 DIAGNOSIS — Z23 Encounter for immunization: Secondary | ICD-10-CM | POA: Diagnosis not present

## 2016-11-30 DIAGNOSIS — J410 Simple chronic bronchitis: Secondary | ICD-10-CM

## 2016-11-30 DIAGNOSIS — B20 Human immunodeficiency virus [HIV] disease: Secondary | ICD-10-CM | POA: Diagnosis not present

## 2016-11-30 DIAGNOSIS — R110 Nausea: Secondary | ICD-10-CM

## 2016-11-30 MED ORDER — ONDANSETRON HCL 4 MG PO TABS: 4.0000 mg | ORAL_TABLET | Freq: Three times a day (TID) | ORAL | 3 refills | Status: DC | PRN

## 2016-11-30 MED ORDER — GUAIFENESIN-DM 100-10 MG/5ML PO SYRP
5.0000 mL | ORAL_SOLUTION | ORAL | 1 refills | Status: DC | PRN
Start: 2016-11-30 — End: 2016-12-28

## 2016-11-30 NOTE — Progress Notes (Signed)
RFV: follow up for hiv disease  Patient ID: [name], male   DOB: Nov 01, 1970, 46 y.o.   MRN: [id number]  HPI 46yo M with advanced HIV disease, CD 4 count of 30/VL<20, on descovy/tivicay/DRVc, bactrim,   Taking meds, unsure which 5 pills. Also getting nausea. Has not brought his medications with him at this visit  Patient has 3 day of cough, keeps him up at night  Outpatient Encounter Prescriptions as of 11/30/2016  Medication Sig  . azithromycin (ZITHROMAX) 600 MG tablet TAKE 2 TABLETS BY MOUTH EVERY 7 DAYS DAYS  . DESCOVY 200-25 MG tablet TAKE 1 TABLET BY MOUTH ONCE DAILY  . ELIQUIS 2.5 MG TABS tablet TAKE 1 TABLET BY MOUTH TWICE DAILY  . feeding supplement (BOOST / RESOURCE BREEZE) LIQD Take 1 Container by mouth 3 (three) times daily between meals.  Marland Kitchen guaiFENesin-dextromethorphan (ROBITUSSIN DM) 100-10 MG/5ML syrup Take 5 mLs by mouth every 4 (four) hours as needed for cough. (Patient not taking: Reported on 06/23/2016)  . loratadine (CLARITIN) 10 MG tablet Take 1 tablet (10 mg total) by mouth daily. (Patient not taking: Reported on 06/23/2016)  . methocarbamol (ROBAXIN) 500 MG tablet Take 1 tablet (500 mg total) by mouth every 6 (six) hours as needed for muscle spasms. (Patient not taking: Reported on 06/23/2016)  . ondansetron (ZOFRAN) 4 MG tablet Take 1 tablet (4 mg total) by mouth every 8 (eight) hours as needed for nausea or vomiting. (Patient not taking: Reported on 06/23/2016)  . oseltamivir (TAMIFLU) 75 MG capsule Take 1 capsule (75 mg total) by mouth 2 (two) times daily. (Patient not taking: Reported on 06/01/2016)  . oxyCODONE-acetaminophen (PERCOCET) 10-325 MG per tablet Take 1 tablet by mouth every 6 (six) hours as needed. (Patient not taking: Reported on 06/23/2016)  . PREZCOBIX 800-150 MG tablet TAKE 1 TABLET BY MOUTH DAILY WITH BREAKFAST. SWALLOW WHOLE. DO NOT CRUSH, BREAK OR CHEW TABLETS. TAKE WITH FOOD.  Marland Kitchen sulfamethoxazole-trimethoprim (BACTRIM DS,SEPTRA DS) 800-160 MG  tablet TAKE 1 TABLET BY MOUTH ONCE DAILY  . TIVICAY 50 MG tablet TAKE 1 TABLET (50 MG TOTAL) BY MOUTH DAILY.   No facility-administered encounter medications on file as of 11/30/2016.      Patient Active Problem List   Diagnosis Date Noted  . 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 Chronic systolic CHF (congestive heart failure) ([name]) 04/24/2016  . AKI (acute kidney injury) ([name]) 04/24/2016  . Flu 04/24/2016  . 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 Hypoxia   . Acute respiratory failure with hypoxia ([name]) 02/25/2016  . Normochromic normocytic anemia 02/25/2016  . Pneumonia 02/25/2016  . Protein-calorie malnutrition, severe 02/25/2016  . PE (pulmonary thromboembolism) ([name]) 02/24/2016  . Renal insufficiency   . Surgery, elective   . AIDS ([name])   . S/P ORIF (open reduction internal fixation) fracture 03/21/2014  . Low back pain 10/10/2013  . Multifocal pneumonia 09/27/2013  . HIV disease ([name]) 09/18/2013  . Refugee health examination 09/18/2013  . Cough 09/18/2013     Health Maintenance Due  Topic Date Due  . TETANUS/TDAP  03/16/1990  . INFLUENZA VACCINE  10/14/2016     Review of Systems Nausea with occ vomiting. 12 point ros is otherwise negative Physical Exam   BP (!) 87/57 (BP Location: Right Arm, Patient Position: Sitting, Cuff Size: Normal)   Pulse (!) 116   Temp 98.9 F (37.2 C) (Oral)   Wt 156 lb 1.9 oz (70.8 kg)   BMI  19.51 kg/m   Physical Exam  Constitutional: He is oriented to person, place, and time. He appears well-developed and well-nourished. No distress.  HENT:  Mouth/Throat: Oropharynx is clear and moist. No oropharyngeal exudate.  Cardiovascular: Normal rate, regular rhythm and normal heart sounds. Exam reveals no gallop and no friction rub.  No murmur heard.  Pulmonary/Chest: Effort normal and breath sounds normal. No respiratory distress. He has no wheezes.  Abdominal: Soft. Bowel sounds are normal.  He exhibits no distension. There is no tenderness.  Lymphadenopathy:  He has no cervical adenopathy.  Neurological: He is alert and oriented to person, place, and time.  Skin: Skin is warm and dry. No rash noted. No erythema.  Psychiatric: He has a normal mood and affect. His behavior is normal.    Lab Results  Component Value Date   CD4TCELL 2 (L) 06/01/2016   Lab Results  Component Value Date   CD4TABS 30 (L) 06/01/2016   CD4TABS 50 (L) 04/16/2016   CD4TABS 20 (L) 02/25/2016   Lab Results  Component Value Date   HIV1RNAQUANT 54,000 02/25/2016   No results found for: HEPBSAB Lab Results  Component Value Date   LABRPR NON REAC 03/22/2014    CBC Lab Results  Component Value Date   WBC 8.4 04/23/2016   RBC 3.72 (L) 04/23/2016   HGB 12.7 (L) 04/23/2016   HCT 37.9 (L) 04/23/2016   PLT 116 (L) 04/23/2016   MCV 101.9 (H) 04/23/2016   MCH 34.1 (H) 04/23/2016   MCHC 33.5 04/23/2016   RDW 15.7 (H) 04/23/2016   LYMPHSABS 1.9 04/23/2016   MONOABS 0.9 04/23/2016   EOSABS 0.0 04/23/2016    BMET Lab Results  Component Value Date   NA 134 (L) 04/23/2016   K 3.6 04/23/2016   CL 101 04/23/2016   CO2 23 04/23/2016   GLUCOSE 121 (H) 04/23/2016   BUN 18 04/23/2016   CREATININE 1.28 (H) 04/23/2016   CALCIUM 9.6 04/23/2016   GFRNONAA >60 04/23/2016   GFRAA >60 04/23/2016      Assessment and Plan  Bronchitis = will give OTC cough medicines to help with sleep  hiv disease = will check labs to see if he is virologically suppressed. 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  ------- Addendum - seeing pharmacy for adherence counseling and may need genotype due to detectable viral load

## 2016-11-30 NOTE — Patient Instructions (Signed)
kuleta dawa yako na wewe Liston Alba ziara ya  Karolee Ohs mwezi 1 hadi kliniki

## 2016-12-01 LAB — RPR: RPR Ser Ql: NONREACTIVE

## 2016-12-01 LAB — COMPLETE METABOLIC PANEL WITH GFR
AG RATIO: 0.4 (calc) — AB (ref 1.0–2.5)
ALT: 21 U/L (ref 9–46)
AST: 40 U/L (ref 10–40)
Albumin: 3.5 g/dL — ABNORMAL LOW (ref 3.6–5.1)
Alkaline phosphatase (APISO): 51 U/L (ref 40–115)
BUN/Creatinine Ratio: 15 (calc) (ref 6–22)
BUN: 20 mg/dL (ref 7–25)
CALCIUM: 9.8 mg/dL (ref 8.6–10.3)
CO2: 25 mmol/L (ref 20–32)
Chloride: 101 mmol/L (ref 98–110)
Creat: 1.36 mg/dL — ABNORMAL HIGH (ref 0.60–1.35)
GFR, EST AFRICAN AMERICAN: 72 mL/min/{1.73_m2} (ref >=60)
GFR, EST NON AFRICAN AMERICAN: 62 mL/min/{1.73_m2} (ref >=60)
GLUCOSE: 105 mg/dL — AB (ref 65–99)
Globulin: 7.8 g/dL (calc) — ABNORMAL HIGH (ref 1.9–3.7)
POTASSIUM: 4.4 mmol/L (ref 3.5–5.3)
Sodium: 137 mmol/L (ref 135–146)
TOTAL PROTEIN: 11.3 g/dL — AB (ref 6.1–8.1)
Total Bilirubin: 0.7 mg/dL (ref 0.2–1.2)

## 2016-12-01 LAB — CBC WITH DIFFERENTIAL/PLATELET
Basophils Absolute: 31 cells/uL (ref 0–200)
Basophils Relative: 0.6 %
EOS ABS: 122 {cells}/uL (ref 15–500)
Eosinophils Relative: 2.4 %
HEMATOCRIT: 40.4 % (ref 38.5–50.0)
Hemoglobin: 13.5 g/dL (ref 13.2–17.1)
Lymphs Abs: 1122 cells/uL (ref 850–3900)
MCH: 32.5 pg (ref 27.0–33.0)
MCHC: 33.4 g/dL (ref 32.0–36.0)
MCV: 97.1 fL (ref 80.0–100.0)
MPV: 11.4 fL (ref 7.5–12.5)
Monocytes Relative: 15 %
Neutro Abs: 3060 cells/uL (ref 1500–7800)
Neutrophils Relative %: 60 %
Platelets: 116 10*3/uL — ABNORMAL LOW (ref 140–400)
RBC: 4.16 10*6/uL — AB (ref 4.20–5.80)
RDW: 10.9 % — AB (ref 11.0–15.0)
TOTAL LYMPHOCYTE: 22 %
WBC: 5.1 10*3/uL (ref 3.8–10.8)
WBCMIX: 765 {cells}/uL (ref 200–950)

## 2016-12-02 ENCOUNTER — Telehealth: Payer: Self-pay | Admitting: Pharmacist

## 2016-12-02 ENCOUNTER — Other Ambulatory Visit: Payer: Self-pay | Admitting: Pharmacist

## 2016-12-02 LAB — T-HELPER CELL (CD4) - (RCID CLINIC ONLY)
CD4 % Helper T Cell: 2 % — ABNORMAL LOW (ref 33–55)
CD4 T Cell Abs: 20 /uL — ABNORMAL LOW (ref 400–2700)

## 2016-12-02 NOTE — Telephone Encounter (Signed)
[name], pharmacist at [facility], called and states patient would like to see [place] ASAP as he cannot keep his medications down and he is throwing up after taking them each time.  His viral load is way up from his visit with Dr. Drue Second earlier this week. He will come in tomorrow at 4pm to see [place].

## 2016-12-03 ENCOUNTER — Ambulatory Visit: Payer: Commercial Managed Care - PPO | Admitting: Pharmacist Clinician (PhC)/ Clinical Pharmacy Specialist

## 2016-12-03 DIAGNOSIS — R110 Nausea: Secondary | ICD-10-CM

## 2016-12-03 DIAGNOSIS — B20 Human immunodeficiency virus [HIV] disease: Secondary | ICD-10-CM

## 2016-12-03 MED ORDER — EMTRICITABINE-TENOFOVIR AF 200-25 MG PO TABS
1.0000 | ORAL_TABLET | Freq: Every day | ORAL | 4 refills | Status: DC
Start: 2016-12-03 — End: 2017-01-26

## 2016-12-03 MED ORDER — DARUNAVIR-COBICISTAT 800-150 MG PO TABS: 1.0000 | ORAL_TABLET | Freq: Every day | ORAL | 4 refills | Status: DC

## 2016-12-03 MED ORDER — ONDANSETRON HCL 4 MG PO TABS: 4.0000 mg | ORAL_TABLET | Freq: Three times a day (TID) | ORAL | 3 refills | Status: DC | PRN

## 2016-12-03 MED ORDER — DOLUTEGRAVIR SODIUM 50 MG PO TABS: 50.0000 mg | ORAL_TABLET | Freq: Every day | ORAL | 4 refills | Status: DC

## 2016-12-03 NOTE — Progress Notes (Signed)
HPI: [name] is a 46 y.o. male who is here to f/u with pharmacy for his N/V issue and adherence  Allergies: No Known Allergies  Vitals:    Past Medical History: Past Medical History:  Diagnosis Date  . Depression    "stress and depression for any man is common" (03/21/2014)  . Hepatitis    "I don't know what hepatitis I have"  . HIV disease ([name])   . TB (pulmonary tuberculosis)    previously treated according to refugee documentation    Social History: Social History   Social History  . Marital status: Single    Spouse name: N/A  . Number of children: N/A  . Years of education: N/A   Social History Main Topics  . Smoking status: Former Smoker    Packs/day: 0.50    Years: 6.00    Types: Cigarettes    Quit date: 03/16/2001  . Smokeless tobacco: Never Used     Comment: "quit smoking ~ 2003"  . Alcohol use Yes     Comment: drinks bottled beer intermittently  . Drug use: No  . Sexual activity: Not Currently    Partners: Female   Other Topics Concern  . Not on file   Social History Narrative   As of 09/18/2013:   -Arrived in [place]: September 05, 2013    -Refugee from [facility], spent 4 years in refugee camp in [place].    -Language: [name], requires Architect (speaks essentially no [place])   -Education: no formal education, previously worked as a Product/process development scientist: family phone number [phone], caseworker is [name] [phone] (with [employer])   -lives with daughter and three sons   -denies alcohol, drug, tobacco abuse      [facility] (02/28/16):   Patient now admitted to drinking bottled beer. Reports continued tobacco abstinence. Denies any bird or mold exposure. No pets currently. No recent travel.    Previous Regimen: DTG 1 PO qday AZT  PO BID Prezcobix 1 PO qday  Current Regimen: Descovy/DTG/Prezc  Labs: HIV 1 RNA Quant (copies/mL)  Date Value  11/30/2016 373,000 (H)  02/25/2016 54,000  04/19/2014 69 (H)    CD4 T Cell Abs (/uL)  Date Value  11/30/2016 20 (L)  06/01/2016 30 (L)  04/16/2016 50 (L)   Hepatitis B Surface Ag (no units)  Date Value  09/26/2013 NEGATIVE   HCV Ab (no units)  Date Value  03/22/2014 NEGATIVE    CrCl: CrCl cannot be calculated (Unknown ideal weight.).  Lipids:    Component Value Date/Time   CHOL 121 03/23/2014 0603   TRIG 27 03/23/2014 0603   HDL 37 (L) 03/23/2014 0603   CHOLHDL 3.3 03/23/2014 0603   VLDL 5 03/23/2014 0603   LDLCALC 79 03/23/2014 0603    Assessment: [name] is here to f/u with pharmacy for his recent issue of N/V. He claimed to have missed 2 doses on those days. There is no way that it's possible because his VL jumped to 373k a few days ago. I asked him several times about non-compliance, he adamantly denied it. His CD4 also declined to 20 now. He has a ton of resistance (chart 04/19/14). We did a regular VL the other so I asked lab to change it to one with reflex with all classes.   He has picked up the zofran the other day and has been taking it. He stated that it has helped with N/V tremendously. He has no symptoms at  the visit. He denied eating anything that would make him sick. I'll send some more Zofran to [facility] so they can mail it to him. He has an appt with Dr. Drue Parsons in mid Oct. We should have an answer by then about his further resistance.   He will need to f/u with Family Medicine also. We'll reach out to [name] again.   Recommendations:  Continue same regimen for now Add-on genotype Send more zofran F/u with Dr. Drue Parsons in Oct Reach out to [name], PharmD, BCPS, AAHIVP, CPP Clinical Infectious Disease Pharmacist [facility] 12/03/2016, 4:28 PM

## 2016-12-04 ENCOUNTER — Telehealth: Payer: Self-pay | Admitting: *Deleted

## 2016-12-04 NOTE — Progress Notes (Signed)
Thank you so much Minh for cc'ing me. [name] has reassured me several times that he is taking his medications.  I also followed up with Pharmacy and they confirmed that he is picking up his medications.   In the past he has told me that he was not approved for his food stamps and that the medications were making him sick(nauseaous) on a empty stomach. I will f/u to see if he lacks food and that is the reason why he is not taking the medications

## 2016-12-04 NOTE — Telephone Encounter (Signed)
Wanted to make [name] aware of my call to [name] for assistance with Food stamp renewal. At this time the number is not accepting calls

## 2016-12-04 NOTE — Telephone Encounter (Signed)
Contacted Ms. Dar Bi who is listed as Mr Scheurich case Financial controller. She is not aware of his HIV status so this information was not shared. Spoke with Ms. Dar Bi's coworker who stated Mr. Barry Brunner will be out of the office for today and she offered me assistance. Explained to her that I am a RN will [facility] and Mr. Samier Jaco is a patient at our office. Explained that Mr. Isidore has expressed to me that his medications make him sick when taken on a empty stomach and he at times does not have food. Mr Bredeson did not renew his food stamp application and due to the language barrier I am not best suited to assist him with calling Social Services. I asked the coworker if Ms. Dar Bi can assist Mr. Stencel with reapplying or renewing his Food stamps. She stated Ms. Dar Bi may be able to assist as long as Mr sheeley is enrolled in a program with them. She stated Ms. Dar Bi use to be a Retail buyer for refugees new to the area but since has changed positions as works in the [employer] division(refugee resettlement program). Gave the listed number for Mr. Albea and the coworker stated she will send a email to Ms. Dar Bi asking her to contact Mr. Casten

## 2016-12-09 ENCOUNTER — Telehealth: Payer: Self-pay | Admitting: *Deleted

## 2016-12-09 NOTE — Telephone Encounter (Signed)
Contacted Ms. [name] and was able to reach her. Explained to her that I am calling in referance to [name]. Explained that I am a nurse with his Dr's office and has some concerns with his ability to afford food and adhere to his medications. [name] stated she is no longer case managing [name] but does have some program assistance that she can offer him. She stated she will give him a call.

## 2016-12-11 LAB — HIV-1 RNA QUANT-NO REFLEX-BLD
HIV 1 RNA QUANT: 373000 {copies}/mL — AB
HIV-1 RNA Quant, Log: 5.57 Log copies/mL — ABNORMAL HIGH

## 2016-12-11 LAB — HIV-1 GENOTYPE: HIV-1 Genotype: DETECTED — AB

## 2016-12-28 ENCOUNTER — Encounter: Payer: Self-pay | Admitting: Internal Medicine

## 2016-12-28 ENCOUNTER — Ambulatory Visit (INDEPENDENT_AMBULATORY_CARE_PROVIDER_SITE_OTHER): Payer: Commercial Managed Care - PPO | Admitting: Internal Medicine

## 2016-12-28 VITALS — BP 110/70 | HR 91 | Temp 98.2°F | Wt 169.0 lb

## 2016-12-28 DIAGNOSIS — R110 Nausea: Secondary | ICD-10-CM

## 2016-12-28 DIAGNOSIS — B20 Human immunodeficiency virus [HIV] disease: Secondary | ICD-10-CM

## 2016-12-28 MED ORDER — IMIQUIMOD 5 % EX CREA: TOPICAL_CREAM | CUTANEOUS | 0 refills | Status: DC

## 2016-12-28 NOTE — Progress Notes (Signed)
RFV: hiv follow up Patient ID: [name], male   DOB: 1970-11-25, 46 y.o.   MRN: [id number]  HPI [name] is a 46yo M who speaks swahili with advanced hiv disease, poorly controlled. At last visit it turned out he was having frequent vomiting after taking his medication. He has met with cassie who helped identify this issue as well as placed him on standing anti emetic which has helped. He is now Doing well Outpatient Encounter Prescriptions as of 12/28/2016  Medication Sig  . azithromycin (ZITHROMAX) 600 MG tablet TAKE 2 TABLETS BY MOUTH EVERY 7 DAYS DAYS  . darunavir-cobicistat (PREZCOBIX) 800-150 MG tablet Take 1 tablet by mouth daily with breakfast. Swallow whole. Do NOT crush, break or chew tablets. Take with food.  . dolutegravir (TIVICAY) 50 MG tablet Take 1 tablet (50 mg total) by mouth daily.  Marland Kitchen ELIQUIS 2.5 MG TABS tablet TAKE 1 TABLET BY MOUTH TWICE DAILY  . emtricitabine-tenofovir AF (DESCOVY) 200-25 MG tablet Take 1 tablet by mouth daily.  . feeding supplement (BOOST / RESOURCE BREEZE) LIQD Take 1 Container by mouth 3 (three) times daily between meals.  . ondansetron (ZOFRAN) 4 MG tablet Take 1 tablet (4 mg total) by mouth every 8 (eight) hours as needed. kichefuchefu (nausea in swahili)  . sulfamethoxazole-trimethoprim (BACTRIM DS,SEPTRA DS) 800-160 MG tablet TAKE 1 TABLET BY MOUTH ONCE DAILY  . [DISCONTINUED] guaiFENesin-dextromethorphan (ROBITUSSIN DM) 100-10 MG/5ML syrup Take 5 mLs by mouth every 4 (four) hours as needed for cough. kikohozi   No facility-administered encounter medications on file as of 12/28/2016.      Patient Active Problem List   Diagnosis Date Noted  . PCP (pneumocystis carinii pneumonia) ([place]) 04/24/2016  . Chronic systolic CHF (congestive heart failure) ([place]) 04/24/2016  . AKI (acute kidney injury) ([place]) 04/24/2016  . Flu 04/24/2016  . 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 Hypoxia   . Acute respiratory  failure with hypoxia ([place]) 02/25/2016  . Normochromic normocytic anemia 02/25/2016  . Pneumonia 02/25/2016  . Protein-calorie malnutrition, severe 02/25/2016  . PE (pulmonary thromboembolism) ([place]) 02/24/2016  . Renal insufficiency   . Surgery, elective   . AIDS ([place])   . S/P ORIF (open reduction internal fixation) fracture 03/21/2014  . Low back pain 10/10/2013  . Multifocal pneumonia 09/27/2013  . HIV disease ([place]) 09/18/2013  . Refugee health examination 09/18/2013  . Cough 09/18/2013     Health Maintenance Due  Topic Date Due  . TETANUS/TDAP  03/16/1990  . INFLUENZA VACCINE  10/14/2016    Social History  Substance Use Topics  . Smoking status: Former Smoker    Packs/day: 0.50    Years: 6.00    Types: Cigarettes    Quit date: 03/16/2001  . Smokeless tobacco: Never Used     Comment: "quit smoking ~ 2003"  . Alcohol use Yes     Comment: drinks bottled beer intermittently  - he does not have a wife here in the [place]. Not sexually active  Review of Systems Review of Systems  Constitutional: Negative for fever, chills, diaphoresis, activity change, appetite change, fatigue and unexpected weight change.  HENT: Negative for congestion, sore throat, rhinorrhea, sneezing, trouble swallowing and sinus pressure.  Eyes: Negative for photophobia and visual disturbance.  Respiratory: Negative for cough, chest tightness, shortness of breath, wheezing and stridor.  Cardiovascular: Negative for chest pain, palpitations and leg swelling.  Gastrointestinal: Negative for nausea, vomiting, abdominal pain, diarrhea, constipation, blood in stool, abdominal  distention and anal bleeding.  Genitourinary: Negative for dysuria, hematuria, flank pain and difficulty urinating.  Musculoskeletal: Negative for myalgias, back pain, joint swelling, arthralgias and gait problem.  Skin: Negative for color change, pallor, rash and wound.  Neurological: Negative for dizziness, tremors, weakness and  light-headedness.  Hematological: Negative for adenopathy. Does not bruise/bleed easily.  Psychiatric/Behavioral: Negative for behavioral problems, confusion, sleep disturbance, dysphoric mood, decreased concentration and agitation.    Physical Exam   BP 110/70   Pulse 91   Temp 98.2 F (36.8 C) (Oral)   Wt 169 lb (76.7 kg)   BMI 21.12 kg/m   Physical Exam  Constitutional: He is oriented to person, place, and time. He appears well-developed and well-nourished. No distress.  HENT:  Mouth/Throat: Oropharynx is clear and moist. No oropharyngeal exudate.  Cardiovascular: Normal rate, regular rhythm and normal heart sounds. Exam reveals no gallop and no friction rub.  No murmur heard.  Pulmonary/Chest: Effort normal and breath sounds normal. No respiratory distress. He has no wheezes.  Abdominal: Soft. Bowel sounds are normal. He exhibits no distension. There is no tenderness.  Lymphadenopathy:  He has no cervical adenopathy.  Neurological: He is alert and oriented to person, place, and time.  Skin: Skin is warm and dry. No rash noted. No erythema.  Psychiatric: He has a normal mood and affect. His behavior is normal.    Lab Results  Component Value Date   CD4TCELL 2 (L) 11/30/2016   Lab Results  Component Value Date   CD4TABS 20 (L) 11/30/2016   CD4TABS 30 (L) 06/01/2016   CD4TABS 50 (L) 04/16/2016   Lab Results  Component Value Date   HIV1RNAQUANT 373,000 (H) 11/30/2016   No results found for: HEPBSAB Lab Results  Component Value Date   LABRPR NON-REACTIVE 11/30/2016    CBC Lab Results  Component Value Date   WBC 5.1 11/30/2016   RBC 4.16 (L) 11/30/2016   HGB 13.5 11/30/2016   HCT 40.4 11/30/2016   PLT 116 (L) 11/30/2016   MCV 97.1 11/30/2016   MCH 32.5 11/30/2016   MCHC 33.4 11/30/2016   RDW 10.9 (L) 11/30/2016   LYMPHSABS 1,122 11/30/2016   MONOABS 0.9 04/23/2016   EOSABS 122 11/30/2016    BMET Lab Results  Component Value Date   NA 137 11/30/2016     K 4.4 11/30/2016   CL 101 11/30/2016   CO2 25 11/30/2016   GLUCOSE 105 (H) 11/30/2016   BUN 20 11/30/2016   CREATININE 1.36 (H) 11/30/2016   CALCIUM 9.8 11/30/2016   GFRNONAA 62 11/30/2016   GFRAA 72 11/30/2016      Assessment and Plan hiv disease poorly controlled -needs viral load to see if improving  ckd 2 = continue to monitor kidney function  oi proph = since CD 4 count <50. Will need to continue his current regimen   Intermittent nausea = continue with zofran prn

## 2016-12-28 NOTE — Progress Notes (Signed)
RN had prearranged a visit for today with [name] but when I called to confirm arrival time [name] stated he is currently at work. There is a language barrier so I assume we both misunderstood the time and date for our scheduled appt

## 2016-12-30 LAB — HIV-1 RNA QUANT-NO REFLEX-BLD
HIV 1 RNA QUANT: 241 {copies}/mL — AB
HIV-1 RNA Quant, Log: 2.38 Log copies/mL — ABNORMAL HIGH

## 2016-12-30 LAB — T-HELPER CELL (CD4) - (RCID CLINIC ONLY)
CD4 % Helper T Cell: 2 % — ABNORMAL LOW (ref 33–55)
CD4 T Cell Abs: 40 /uL — ABNORMAL LOW (ref 400–2700)

## 2017-01-04 ENCOUNTER — Other Ambulatory Visit: Payer: Self-pay | Admitting: Pharmacist Clinician (PhC)/ Clinical Pharmacy Specialist

## 2017-01-04 ENCOUNTER — Ambulatory Visit (INDEPENDENT_AMBULATORY_CARE_PROVIDER_SITE_OTHER): Payer: Commercial Managed Care - PPO | Admitting: Infectious Disease

## 2017-01-04 ENCOUNTER — Ambulatory Visit: Payer: Commercial Managed Care - PPO | Admitting: Infectious Disease

## 2017-01-04 ENCOUNTER — Encounter: Payer: Self-pay | Admitting: Infectious Disease

## 2017-01-04 DIAGNOSIS — A630 Anogenital (venereal) warts: Secondary | ICD-10-CM | POA: Diagnosis not present

## 2017-01-04 DIAGNOSIS — B20 Human immunodeficiency virus [HIV] disease: Secondary | ICD-10-CM

## 2017-01-04 HISTORY — DX: Anogenital (venereal) warts: A63.0

## 2017-01-04 MED ORDER — IMIQUIMOD 5 % EX CREA: TOPICAL_CREAM | CUTANEOUS | 0 refills | Status: DC

## 2017-01-04 NOTE — Progress Notes (Signed)
[facility] so we will tx to [facility] so they can mail it to him.

## 2017-01-04 NOTE — Progress Notes (Signed)
Subjective:   Chief complaint: Worsening of rash in his groin   Patient ID: [name], male    DOB: 09/03/1970, 46 y.o.   MRN: [id number]  HPI  46 year old [name] man with previously poorly controlled HIV with extensive enucleated tired and non-nucleotide resistance as demonstrated by genotype in July 2015 included dreaded K65R, M184V, K103,   K65R, D67G, A98G, L100I, K103N, K103S, V108I, M184V, T215I, K219E, P225H  Nucleoside Reverse Transcriptase Inhibitors abacavir (ABC) High-Level Resistance zidovudine (AZT) Low-Level Resistance emtricitabine (FTC) High-Level Resistance lamivudine (3TC) High-Level Resistance tenofovir (TDF) High-Level Resistance  Non-nucleoside Reverse Transcriptase Inhibitors doravirine (DOR) High-Level Resistance efavirenz (EFV) High-Level Resistance etravirine (ETR) Intermediate Resistance nevirapine (NVP) High-Level Resistance rilpivirine (RPV) High-Level Resistance   I do not know of INSTI R and no PI R documented.  He has finally begun to bring his virus under control on a regimen of PREZCOBIX, TIVICAY and Descovy with VL dropping to 200s  Lab Results  Component Value Date   HIV1RNAQUANT 241 (H) 12/28/2016   HIV1RNAQUANT 373,000 (H) 11/30/2016   HIV1RNAQUANT 54,000 02/25/2016    Lab Results  Component Value Date   CD4TABS 40 (L) 12/28/2016   CD4TABS 20 (L) 11/30/2016   CD4TABS 30 (L) 06/01/2016   He saw Dr. Drue SecondSnider roughly 7 days ago and labs were drawn at that time.  He he walked in the clinic today because he was unhappy that prescription for Aldara had been not sent to the [facility].  This is being sent today and should be at his house by tomorrow.  His rash which appears largely to be clusters of HPV in groing is progressively worse.  No nausea vomiting fevers or systemic symptoms.  Past Medical History:  Diagnosis Date  . Depression    "stress and depression for any man is common" (03/21/2014)  . Hepatitis    "I don't know what hepatitis I have"  . HIV disease ([name])   . TB (pulmonary tuberculosis)    previously treated according to refugee documentation    Past Surgical History:  Procedure Laterality Date  . FRACTURE SURGERY    . IM NAILING TIBIA Right 03/21/2014  . ORIF ANKLE FRACTURE Right 03/21/2014   lateral malleolus/notes 03/21/2014  . ORIF ANKLE FRACTURE Right 03/21/2014   Procedure: OPEN REDUCTION INTERNAL FIXATION (ORIF) pilon ;  Surgeon: Eldred MangesMark C Yates, MD;  Location: [facility] OR;  Service: Orthopedics;  Laterality: Right;  . TIBIA IM NAIL INSERTION Right 03/21/2014   Procedure: INTRAMEDULLARY (IM) NAIL TIBIAL;  Surgeon: Eldred MangesMark C Yates, MD;  Location: [facility] OR;  Service: Orthopedics;  Laterality: Right;  Marland Kitchen. VIDEO BRONCHOSCOPY Bilateral 03/02/2016   Procedure: VIDEO BRONCHOSCOPY WITHOUT FLUORO;  Surgeon: Roslynn AmbleJennings E Nestor, MD;  [facility];  Service: Cardiopulmonary;  Laterality: Bilateral;    Family History  Problem Relation Age of Onset  . Hypertension Other   . Heart disease Sister       Social History   Social History  . Marital status: Single    Spouse name: N/A  . Number of children: N/A  . Years of education: N/A   Social History Main Topics  . Smoking status: Former Smoker    Packs/day: 0.50    Years: 6.00    Types: Cigarettes    Quit date: 03/16/2001  . Smokeless tobacco: Never Used     Comment: "quit smoking ~ 2003"  . Alcohol use Yes     Comment: drinks bottled beer intermittently  . Drug use: No  .  Sexual activity: Not Currently    Partners: Female   Other Topics Concern  . Not on file   Social History Narrative   As of 09/18/2013:   -Arrived in [place]: September 05, 2013    -Refugee from [facility], spent 4 years in refugee camp in [place].    -Language: [name], requires Architect (speaks essentially no [place])   -Education: no formal education, previously worked as a Product/process development scientist: family phone number [phone], caseworker is Lannette Donath Dar Bi [phone] (with  [employer])   -lives with daughter and three sons   -denies alcohol, drug, tobacco abuse      [facility] (02/28/16):   Patient now admitted to drinking bottled beer. Reports continued tobacco abstinence. Denies any bird or mold exposure. No pets currently. No recent travel.    No Known Allergies   Current Outpatient Prescriptions:  .  azithromycin (ZITHROMAX) 600 MG tablet, TAKE 2 TABLETS BY MOUTH EVERY 7 DAYS DAYS, Disp: 8 tablet, Rfl: 2 .  darunavir-cobicistat (PREZCOBIX) 800-150 MG tablet, Take 1 tablet by mouth daily with breakfast. Swallow whole. Do NOT crush, break or chew tablets. Take with food., Disp: 30 tablet, Rfl: 4 .  dolutegravir (TIVICAY) 50 MG tablet, Take 1 tablet (50 mg total) by mouth daily., Disp: 30 tablet, Rfl: 4 .  ELIQUIS 2.5 MG TABS tablet, TAKE 1 TABLET BY MOUTH TWICE DAILY, Disp: 60 tablet, Rfl: 0 .  emtricitabine-tenofovir AF (DESCOVY) 200-25 MG tablet, Take 1 tablet by mouth daily., Disp: 30 tablet, Rfl: 4 .  feeding supplement (BOOST / RESOURCE BREEZE) LIQD, Take 1 Container by mouth 3 (three) times daily between meals., Disp: 90 Container, Rfl: 0 .  imiquimod (ALDARA) 5 % cream, Apply topically 3 (three) times a week., Disp: 12 each, Rfl: 0 .  ondansetron (ZOFRAN) 4 MG tablet, Take 1 tablet (4 mg total) by mouth every 8 (eight) hours as needed. kichefuchefu (nausea in swahili), Disp: 20 tablet, Rfl: 3 .  sulfamethoxazole-trimethoprim (BACTRIM DS,SEPTRA DS) 800-160 MG tablet, TAKE 1 TABLET BY MOUTH ONCE DAILY, Disp: 30 tablet, Rfl: 2    Review of Systems  Constitutional: Positive for fatigue. Negative for activity change, appetite change, chills, diaphoresis, fever and unexpected weight change.  HENT: Negative for congestion, rhinorrhea, sinus pressure, sneezing, sore throat and trouble swallowing.   Eyes: Negative for photophobia and visual disturbance.  Respiratory: Negative for cough, chest tightness, shortness of breath, wheezing and  stridor.   Cardiovascular: Negative for chest pain, palpitations and leg swelling.  Gastrointestinal: Negative for abdominal distention, abdominal pain, anal bleeding, blood in stool, constipation, diarrhea, nausea and vomiting.  Genitourinary: Negative for difficulty urinating, dysuria, flank pain and hematuria.  Musculoskeletal: Negative for arthralgias, back pain, gait problem, joint swelling and myalgias.  Skin: Positive for color change and rash. Negative for pallor and wound.  Neurological: Negative for dizziness, tremors, weakness and light-headedness.  Hematological: Negative for adenopathy. Does not bruise/bleed easily.  Psychiatric/Behavioral: Negative for agitation, behavioral problems, confusion, decreased concentration, dysphoric mood and sleep disturbance.       Objective:   Physical Exam  Constitutional: He is oriented to person, place, and time. He appears well-developed and well-nourished. No distress.  HENT:  Head: Normocephalic and atraumatic.  Mouth/Throat: No oropharyngeal exudate.  Eyes: Conjunctivae and EOM are normal. No scleral icterus.  Neck: Normal range of motion. Neck supple.  Cardiovascular: Normal rate and regular rhythm.   Pulmonary/Chest: Effort normal. No respiratory distress. He has no wheezes.  Abdominal: He  exhibits no distension.  Musculoskeletal: He exhibits no edema or tenderness.  Neurological: He is alert and oriented to person, place, and time. He exhibits normal muscle tone. Coordination normal.  Skin: Skin is warm and dry. Rash noted. He is not diaphoretic. No erythema. No pallor.  Psychiatric: He has a normal mood and affect. His behavior is normal. Judgment and thought content normal. His speech is delayed.   Rash   raised areas c/w HPV   01/04/17:         Assessment & Plan:   HPV, genital warts:   Went over as did Minh how to use Aldara cream TIW and how to keep it on then wash out after set amount of time  MDR HIV: I would  consider just using Tivicay and Prezcobix since his virus is fully R to Descovy. I am sure we are reducing fitness with these NRTI's but given they are not coformulated with either of the other 2 I worry about for example him taking Tiviay along with the Descovy. I would consider switch to [facility] and Tivicay if wanted to use ALL of these drugs together and also benefit from smaller pill size.

## 2017-01-20 ENCOUNTER — Other Ambulatory Visit: Payer: Self-pay | Admitting: Internal Medicine

## 2017-01-26 ENCOUNTER — Encounter: Payer: Self-pay | Admitting: Internal Medicine

## 2017-01-26 ENCOUNTER — Telehealth: Payer: Self-pay | Admitting: Pharmacist Clinician (PhC)/ Clinical Pharmacy Specialist

## 2017-01-26 ENCOUNTER — Ambulatory Visit (INDEPENDENT_AMBULATORY_CARE_PROVIDER_SITE_OTHER): Payer: Commercial Managed Care - PPO | Admitting: Internal Medicine

## 2017-01-26 ENCOUNTER — Other Ambulatory Visit: Payer: Self-pay | Admitting: Pharmacist

## 2017-01-26 VITALS — BP 114/72 | HR 98 | Temp 98.2°F | Ht 68.9 in | Wt 167.0 lb

## 2017-01-26 DIAGNOSIS — B20 Human immunodeficiency virus [HIV] disease: Secondary | ICD-10-CM

## 2017-01-26 DIAGNOSIS — A630 Anogenital (venereal) warts: Secondary | ICD-10-CM

## 2017-01-26 MED ORDER — DARUN-COBIC-EMTRICIT-TENOFAF 800-150-200-10 MG PO TABS: 1.0000 | ORAL_TABLET | Freq: Every day | ORAL | 11 refills | Status: DC

## 2017-01-26 MED ORDER — GUAIFENESIN-DM 100-10 MG/5ML PO SYRP: 5.0000 mL | ORAL_SOLUTION | ORAL | 0 refills | Status: DC | PRN

## 2017-01-26 NOTE — Telephone Encounter (Signed)
Error

## 2017-01-26 NOTE — Patient Instructions (Signed)
  Come with all your medications on Monday at 3pm.

## 2017-01-26 NOTE — Progress Notes (Signed)
Patient ID: [name], male   DOB: 10/26/1970, 46 y.o.   MRN: [id number]  HPI [name] is a 46yo M who is swahili speaking. 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 He has been taking his medicines for his hiv as directed. Not missing a dose.  Outpatient Encounter Medications as of 01/26/2017  Medication Sig  . azithromycin (ZITHROMAX) 600 MG tablet TAKE 2 TABLETS BY MOUTH EVERY 7 DAYS DAYS  . darunavir-cobicistat (PREZCOBIX) 800-150 MG tablet Take 1 tablet by mouth daily with breakfast. Swallow whole. Do NOT crush, break or chew tablets. Take with food.  . dolutegravir (TIVICAY) 50 MG tablet Take 1 tablet (50 mg total) by mouth daily.  Marland Kitchen. ELIQUIS 2.5 MG TABS tablet TAKE 1 TABLET BY MOUTH TWICE DAILY  . emtricitabine-tenofovir AF (DESCOVY) 200-25 MG tablet Take 1 tablet by mouth daily.  . feeding supplement (BOOST / RESOURCE BREEZE) LIQD Take 1 Container by mouth 3 (three) times daily between meals.  . imiquimod (ALDARA) 5 % cream APPLY TOPICALLY 3 (THREE) TIMES A WEEK.  . ondansetron (ZOFRAN) 4 MG tablet Take 1 tablet (4 mg total) by mouth every 8 (eight) hours as needed. kichefuchefu (nausea in swahili)  . sulfamethoxazole-trimethoprim (BACTRIM DS,SEPTRA DS) 800-160 MG tablet TAKE 1 TABLET BY MOUTH ONCE DAILY   No facility-administered encounter medications on file as of 01/26/2017.      Patient Active Problem List   Diagnosis Date Noted  . Genital warts 01/04/2017  . 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 Chronic systolic CHF (congestive heart failure) ([name]) 04/24/2016  . AKI (acute kidney injury) ([name]) 04/24/2016  . Flu 04/24/2016  . 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 Hypoxia   . Acute respiratory failure with hypoxia ([name]) 02/25/2016  . Normochromic normocytic anemia 02/25/2016  . Pneumonia 02/25/2016  . Protein-calorie malnutrition, severe 02/25/2016   . PE (pulmonary thromboembolism) ([name]) 02/24/2016  . Renal insufficiency   . Surgery, elective   . AIDS ([name])   . S/P ORIF (open reduction internal fixation) fracture 03/21/2014  . Low back pain 10/10/2013  . Multifocal pneumonia 09/27/2013  . HIV disease ([name]) 09/18/2013  . Refugee health examination 09/18/2013  . Cough 09/18/2013     Health Maintenance Due  Topic Date Due  . TETANUS/TDAP  03/16/1990     Review of Systems + rash -hpv rash, otherwise 12 point ros is negative Physical Exam   BP 114/72   Pulse 98   Temp 98.2 F (36.8 C) (Oral)   Ht 5' 8.9" (1.75 m)   Wt 167 lb (75.8 kg)   BMI 24.73 kg/m   Physical Exam  Constitutional: He is oriented to person, place, and time. He appears well-developed and well-nourished. No distress.  HENT:  Mouth/Throat: Oropharynx is clear and moist. No oropharyngeal exudate.  Cardiovascular: Normal rate, regular rhythm and normal heart sounds. Exam reveals no gallop and no friction rub.  No murmur heard.  Pulmonary/Chest: Effort normal and breath sounds normal. No respiratory distress. He has no wheezes.  Abdominal: Soft. Bowel sounds are normal. He exhibits no distension. There is no tenderness.  Lymphadenopathy:  He has no cervical adenopathy.  Neurological: He is alert and oriented to person, place, and time.  Skin: Skin is warm and dry. No rash noted. No erythema.  Psychiatric: He has a normal mood and affect. His behavior is normal.  Lab Results  Component Value Date   CD4TCELL 2 (L) 12/28/2016   Lab Results  Component Value Date   CD4TABS 40 (L) 12/28/2016   CD4TABS 20 (L) 11/30/2016   CD4TABS 30 (L) 06/01/2016   Lab Results  Component Value Date   HIV1RNAQUANT 241 (H) 12/28/2016   No results found for: HEPBSAB Lab Results  Component Value Date   LABRPR NON-REACTIVE 11/30/2016    CBC Lab Results  Component Value Date   WBC 5.1 11/30/2016   RBC 4.16 (L) 11/30/2016   HGB 13.5 11/30/2016   HCT 40.4  11/30/2016   PLT 116 (L) 11/30/2016   MCV 97.1 11/30/2016   MCH 32.5 11/30/2016   MCHC 33.4 11/30/2016   RDW 10.9 (L) 11/30/2016   LYMPHSABS 1,122 11/30/2016   MONOABS 0.9 04/23/2016   EOSABS 122 11/30/2016    BMET Lab Results  Component Value Date   NA 137 11/30/2016   K 4.4 11/30/2016   CL 101 11/30/2016   CO2 25 11/30/2016   GLUCOSE 105 (H) 11/30/2016   BUN 20 11/30/2016   CREATININE 1.36 (H) 11/30/2016   CALCIUM 9.8 11/30/2016   GFRNONAA 62 11/30/2016   GFRAA 72 11/30/2016      Assessment and Plan hiv disease = we will have him come back to clinic with all his meds and resort his meds. He has complete resistance to nnrti thus unsure if descovy is doing anything for his viral suppression. Plan to do tivicay-DRVc. Will check labs to see if he is virologically controlled  hpv genital rash = will refer to dermatology for cryo/laser therapy,

## 2017-01-26 NOTE — Progress Notes (Signed)
HPI: [name] is a 46 y.o. male who presents to the [facility] clinic today to follow-up with Dr. Drue SecondSnider for his HIV infection.   Allergies: No Known Allergies  Past Medical History: Past Medical History:  Diagnosis Date  . Depression    "stress and depression for any man is common" (03/21/2014)  . Genital warts 01/04/2017  . Hepatitis    "I don't know what hepatitis I have"  . HIV disease ([name])   . TB (pulmonary tuberculosis)    previously treated according to refugee documentation    Social History: Social History   Socioeconomic History  . Marital status: Single    Spouse name: None  . Number of children: None  . Years of education: None  . Highest education level: None  Social Needs  . Financial resource strain: None  . Food insecurity - worry: None  . Food insecurity - inability: None  . Transportation needs - medical: None  . Transportation needs - non-medical: None  Occupational History  . None  Tobacco Use  . Smoking status: Former Smoker    Packs/day: 0.50    Years: 6.00    Pack years: 3.00    Types: Cigarettes    Last attempt to quit: 03/16/2001    Years since quitting: 15.8  . Smokeless tobacco: Never Used  . Tobacco comment: "quit smoking ~ 2003"  Substance and Sexual Activity  . Alcohol use: Yes    Comment: drinks bottled beer intermittently  . Drug use: No  . Sexual activity: Not Currently    Partners: Female  Other Topics Concern  . None  Social History Narrative   As of 09/18/2013:   -Arrived in [place]: [place] 05, [place]    -[facility], spent 4 years in refugee camp [place].    -Language: [name], requires Architectwahili intepreter (speaks essentially no [place])   -Education: no formal education, previously worked as a Product/process development scientistdriver   -Contact: family phone number [phone], caseworker is Lannette DonathFa Ri Dar Bi [phone] (with [employer])   -lives with daughter and three sons   -denies alcohol, drug, tobacco abuse       Pulmonary  (02/28/16):   Patient now admitted to drinking bottled beer. Reports continued tobacco abstinence. Denies any bird or mold exposure. No pets currently. No recent travel.    Current Regimen: Prezcobix + Tivicay + Descovy  Labs: HIV 1 RNA Quant (copies/mL)  Date Value  12/28/2016 241 (H)  11/30/2016 373,000 (H)  02/25/2016 54,000   CD4 T Cell Abs (/uL)  Date Value  12/28/2016 40 (L)  11/30/2016 20 (L)  06/01/2016 30 (L)   Hepatitis B Surface Ag (no units)  Date Value  09/26/2013 NEGATIVE   HCV Ab (no units)  Date Value  03/22/2014 NEGATIVE    CrCl: CrCl cannot be calculated (Patient's most recent lab result is older than the maximum 21 days allowed.).  Lipids:    Component Value Date/Time   CHOL 121 03/23/2014 0603   TRIG 27 03/23/2014 0603   HDL 37 (L) 03/23/2014 0603   CHOLHDL 3.3 03/23/2014 0603   VLDL 5 03/23/2014 0603   LDLCALC 79 03/23/2014 0603    Assessment: [name] is here today to follow-up with Dr. Drue SecondSnider for his HIV infection.  He has extensive resistance and poor adherence to ART. He was having issues with vomiting after taking his medications which has since resolved with the addition of an anti-emetic prior to taking medications. HIV RNA came down to 241  from 373,000 when last checked in October.  He does have pan resistance to NRTI and NNRTI but has previously been suppressed on Prezcobix + Tivicay + Descovy.  We will try to consolidate treatment for him now to make adherence easier.  Dr. Drue SecondSnider will get labs today and he will come back and see pharmacy on Monday 11/19 at 3pm.  [facility] courier [facility] here to switch him too.  I told him to bring all of his medications to the appointment so we could explain what he will be on. Will make an appt with Dr. Drue SecondSnider at that visit also (she requested we make sure he has a follow up with her).   Plans: - F/u pharmacy appt 11/19 at 3pm - Will stop Prezcobix and Descovy - Will start Symtuza and continue Tivicay  when he follows up  Cassie L. Kuppelweiser, PharmD, AAHIVP, CPP Infectious Diseases Clinical Pharmacist [facility] 01/26/2017, 4:45 PM

## 2017-01-27 ENCOUNTER — Other Ambulatory Visit: Payer: Self-pay | Admitting: Pharmacist

## 2017-01-28 ENCOUNTER — Telehealth: Payer: Self-pay | Admitting: Pharmacist

## 2017-01-28 LAB — HIV-1 RNA QUANT-NO REFLEX-BLD
HIV 1 RNA Quant: 38 copies/mL — ABNORMAL HIGH
HIV-1 RNA Quant, Log: 1.58 Log copies/mL — ABNORMAL HIGH

## 2017-01-28 LAB — T-HELPER CELL (CD4) - (RCID CLINIC ONLY)
CD4 T CELL HELPER: 3 % — AB (ref 33–55)
CD4 T Cell Abs: 50 /uL — ABNORMAL LOW (ref 400–2700)

## 2017-01-28 NOTE — Telephone Encounter (Signed)
Called patient via [employer] and changed his appointment from next Monday to December the 5th at 3 PM. He expressed understanding.

## 2017-02-01 ENCOUNTER — Ambulatory Visit: Payer: Commercial Managed Care - PPO

## 2017-02-17 ENCOUNTER — Ambulatory Visit (INDEPENDENT_AMBULATORY_CARE_PROVIDER_SITE_OTHER): Payer: Commercial Managed Care - PPO | Admitting: Pharmacist

## 2017-02-17 DIAGNOSIS — B20 Human immunodeficiency virus [HIV] disease: Secondary | ICD-10-CM | POA: Diagnosis not present

## 2017-02-17 MED ORDER — DOLUTEGRAVIR SODIUM 50 MG PO TABS: 50.0000 mg | ORAL_TABLET | Freq: Every day | ORAL | 5 refills | Status: DC

## 2017-02-17 MED ORDER — GUAIFENESIN 100 MG/5ML PO SOLN: 5.0000 mL | ORAL | 0 refills | Status: DC | PRN

## 2017-02-17 MED ORDER — DARUNAVIR-COBICISTAT 800-150 MG PO TABS: 1.0000 | ORAL_TABLET | Freq: Every day | ORAL | 5 refills | Status: DC

## 2017-02-17 NOTE — Progress Notes (Signed)
HPI: [name] is a 46 y.o. male who presents to the [facility] pharmacy clinic for HIV follow-up.   Allergies: No Known Allergies   Past Medical History: Past Medical History:  Diagnosis Date  . Depression    "stress and depression for any man is common" (03/21/2014)  . Genital warts 01/04/2017  . Hepatitis    "I don't know what hepatitis I have"  . HIV disease ([name])   . TB (pulmonary tuberculosis)    previously treated according to refugee documentation    Social History: Social History   Socioeconomic History  . Marital status: Single    Spouse name: Not on file  . Number of children: Not on file  . Years of education: Not on file  . Highest education level: Not on file  Social Needs  . Financial resource strain: Not on file  . Food insecurity - worry: Not on file  . Food insecurity - inability: Not on file  . Transportation needs - medical: Not on file  . Transportation needs - non-medical: Not on file  Occupational History  . Not on file  Tobacco Use  . Smoking status: Former Smoker    Packs/day: 0.50    Years: 6.00    Pack years: 3.00    Types: Cigarettes    Last attempt to quit: 03/16/2001    Years since quitting: 15.9  . Smokeless tobacco: Never Used  . Tobacco comment: "quit smoking ~ 2003"  Substance and Sexual Activity  . Alcohol use: Yes    Comment: drinks bottled beer intermittently  . Drug use: No  . Sexual activity: Not Currently    Partners: Female  Other Topics Concern  . Not on file  Social History Narrative   As of 09/18/2013:   -[place]rived in [place]: [place] 05, [place]    -[facility], spent 4 years in refugee camp [place].    -Language: [name], requires Architectwahili intepreter (speaks essentially no [place])   -Education: no formal education, previously worked as a Product/process development scientistdriver   -Contact: family phone number [phone], caseworker is Lannette DonathFa Ri Dar Bi [phone] (with [employer])   -lives with daughter and three sons   -denies alcohol, drug,  tobacco abuse      [facility] (02/28/16):   Patient now admitted to drinking bottled beer. Reports continued tobacco abstinence. Denies any bird or mold exposure. No pets currently. No recent travel.    Current Regimen: Prezcobix + Tivicay + Descovy  Labs: HIV 1 RNA Quant (copies/mL)  Date Value  01/26/2017 38 (H)  12/28/2016 241 (H)  11/30/2016 373,000 (H)   CD4 T Cell Abs (/uL)  Date Value  01/26/2017 50 (L)  12/28/2016 40 (L)  11/30/2016 20 (L)   Hepatitis B Surface Ag (no units)  Date Value  09/26/2013 NEGATIVE   HCV Ab (no units)  Date Value  03/22/2014 NEGATIVE    CrCl: CrCl cannot be calculated (Patient's most recent lab result is older than the maximum 21 days allowed.).  Lipids:    Component Value Date/Time   CHOL 121 03/23/2014 0603   TRIG 27 03/23/2014 0603   HDL 37 (L) 03/23/2014 0603   CHOLHDL 3.3 03/23/2014 0603   VLDL 5 03/23/2014 0603   LDLCALC 79 03/23/2014 0603   HIV Genotype Composite Data Genotype Dates: 09/18/13, 02/17/17  RT Mutations  K65R, D67G, A98G, L100I, K103N, K103S, V108I, M184V, T215I, K219E, P225H  PI Mutations   Integrase Mutations    Interpretation of Genotype Data  per [facility] HIV Database  Nucleoside RTIs  Abacavir - high level resistance Zidovudine - low level resistance Emtricitabine - high level resistance Lamivudine - high level resistance Tenofovir - high level resistance   Non-Nucleoside RTIs  Doravirine - high level resistance Efavirenz - high level resistance Etravirine - intermediate level resistance Nevirapine - high level resistance Rilpivirine - high level resistance   Protease Inhibitors  N/A   Integrase Inhibitors  N/A    Assessment: [name] is here today to follow-up for his HIV infection.  He has an extensive history of non-compliance and advanced HIV with mult-drug resistance.  He was taking Prezcobix, Tivicay, and Descovy and having frequent vomiting and nausea thus resulting in not  getting adequate medication.  I placed him on a standing anti-emetic that seemed to help tremendously.  He is seeing me today to change his medications - actually just discontinuing the Descovy as he has a K65R, T215I, and M184V mutation resulting in loss of tenofovir and emtrictiabine making Descovy ineffective.  He brought in his pill box today. He does not use the pill box in the way it is intended. Instead, he puts all of one week's worth of medication in one day of the pill box (ie all 7 days of Prezcobix on Monday, 7 days of Tivicay on Tuesday, etc).  He tells me this is the best way for him to keep them together and to remember to take one of each daily.  He is also taking Bactrim daily. I tried to help him fill out the pill box as intended, but he asked me to leave it.  I did remove all of the Descovy from the pill box and told him to stop taking it.  I communicated through [employer] [employer] [phone] for [name].   He is not having any nausea or vomiting anymore. No side effects attributed to the medications. [facility] and has no issues obtaining them. He is still complaining of his genital warts, states Aldara does nothing for them.  I will reach back out to Dr. Drue SecondSnider to make sure the referral to dermatology was made.  He is also asking about the cough medicine that [facility], so I sent it there.  No other issues or problems today.  I will have him come back in one month for adherence assessment and labs. Will keep close track of him.   Plans: - Stop Descovy - Continue Prezcobix and Tivicay daily - F/u with me again 03/29/17 at 230pm  Calem Cocozza L. Ermagene Saidi, PharmD, AAHIVP, CPP Infectious Diseases Clinical Pharmacist [facility] 02/17/2017, 3:31 PM

## 2017-03-02 ENCOUNTER — Ambulatory Visit (INDEPENDENT_AMBULATORY_CARE_PROVIDER_SITE_OTHER): Payer: Commercial Managed Care - PPO | Admitting: Internal Medicine

## 2017-03-02 ENCOUNTER — Telehealth: Payer: Self-pay

## 2017-03-02 DIAGNOSIS — A630 Anogenital (venereal) warts: Secondary | ICD-10-CM

## 2017-03-02 DIAGNOSIS — B20 Human immunodeficiency virus [HIV] disease: Secondary | ICD-10-CM | POA: Diagnosis not present

## 2017-03-02 NOTE — Progress Notes (Signed)
RFV: worsening rash Patient ID: [name], male   DOB: 11/14/1970, 46 y.o.   MRN: [id number]  HPI [name] is a 46yo M with advanced hiv disease, CD 4 count of 50/VL 38 in November 2018 currently on tivicay/prezcobix plus bactrim oi proph. He reports using aldara cream but still not much improvement to his genital warts. Having some associated pain and drainage  Outpatient Encounter Medications as of 03/02/2017  Medication Sig  . darunavir-cobicistat (PREZCOBIX) 800-150 MG tablet Take 1 tablet by mouth daily with breakfast.  . dolutegravir (TIVICAY) 50 MG tablet Take 1 tablet (50 mg total) by mouth daily.  Marland Kitchen. ELIQUIS 2.5 MG TABS tablet TAKE 1 TABLET BY MOUTH TWICE DAILY  . feeding supplement (BOOST / RESOURCE BREEZE) LIQD Take 1 Container by mouth 3 (three) times daily between meals.  Marland Kitchen. guaiFENesin (ROBITUSSIN) 100 MG/5ML SOLN Take 5 mLs (100 mg total) by mouth every 4 (four) hours as needed for cough or to loosen phlegm.  . imiquimod (ALDARA) 5 % cream APPLY TOPICALLY 3 (THREE) TIMES A WEEK.  . ondansetron (ZOFRAN) 4 MG tablet Take 1 tablet (4 mg total) by mouth every 8 (eight) hours as needed. kichefuchefu (nausea in swahili)  . sulfamethoxazole-trimethoprim (BACTRIM DS,SEPTRA DS) 800-160 MG tablet TAKE 1 TABLET BY MOUTH ONCE DAILY   No facility-administered encounter medications on file as of 03/02/2017.      Patient Active Problem List   Diagnosis Date Noted  . Genital warts 01/04/2017  . 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 Chronic systolic CHF (congestive heart failure) ([name]) 04/24/2016  . AKI (acute kidney injury) ([name]) 04/24/2016  . Flu 04/24/2016  . 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 Hypoxia   . Acute respiratory failure with hypoxia ([name]) 02/25/2016  . Normochromic normocytic anemia 02/25/2016  . Pneumonia 02/25/2016  . Protein-calorie malnutrition, severe 02/25/2016  . PE (pulmonary thromboembolism) ([name])  02/24/2016  . Renal insufficiency   . Surgery, elective   . AIDS ([name])   . S/P ORIF (open reduction internal fixation) fracture 03/21/2014  . Low back pain 10/10/2013  . Multifocal pneumonia 09/27/2013  . HIV disease ([name]) 09/18/2013  . Refugee health examination 09/18/2013  . Cough 09/18/2013     Health Maintenance Due  Topic Date Due  . TETANUS/TDAP  03/16/1990     Review of Systems Per hpi, GU - rash Physical Exam  gen = a xo by 3 in  Heavy plaque of genital warts involving inguinal region,scrotum and perineum.  Lab Results  Component Value Date   CD4TCELL 3 (L) 01/26/2017   Lab Results  Component Value Date   CD4TABS 50 (L) 01/26/2017   CD4TABS 40 (L) 12/28/2016   CD4TABS 20 (L) 11/30/2016   Lab Results  Component Value Date   HIV1RNAQUANT 38 (H) 01/26/2017   No results found for: HEPBSAB Lab Results  Component Value Date   LABRPR NON-REACTIVE 11/30/2016    CBC Lab Results  Component Value Date   WBC 5.1 11/30/2016   RBC 4.16 (L) 11/30/2016   HGB 13.5 11/30/2016   HCT 40.4 11/30/2016   PLT 116 (L) 11/30/2016   MCV 97.1 11/30/2016   MCH 32.5 11/30/2016   MCHC 33.4 11/30/2016   RDW 10.9 (L) 11/30/2016   LYMPHSABS 1,122 11/30/2016   MONOABS 0.9 04/23/2016   EOSABS 122 11/30/2016    BMET Lab Results  Component Value Date   NA 137 11/30/2016   K 4.4 11/30/2016  CL 101 11/30/2016   CO2 25 11/30/2016   GLUCOSE 105 (H) 11/30/2016   BUN 20 11/30/2016   CREATININE 1.36 (H) 11/30/2016   CALCIUM 9.8 11/30/2016   GFRNONAA 62 11/30/2016   GFRAA 72 11/30/2016      Assessment and Plan  Advanced hiv disease = - continue with his HIV regimen. He has good virologic control  Genital warts = unlikely to get much improvement with laser or topical treatment,  - we will refer him to urology for likely surgical resection of genital warts. [facility]

## 2017-03-02 NOTE — Telephone Encounter (Signed)
Patient walked into clinic with complaint of groin pain and rash getting worse.     Genital warts present in groin are and on both sides of his penis with clear drainage present.  He has a wash cloth between his legs which is soaked .  Patient states he is in a lot of pain and could hardly sleep last night.  I was able to compare current status with photo taken by Dr Daiva EvesVan Dam and the warts have increased in size and have spread to different areas.  I will ask Dr Drue SecondSnider to assess the patients.   Dr Drue SecondSnider has requested a urology referral.  Appointment with [facility] on December 27th  at 845 am.   Information was given to patient.    Abdominal pads given to patient to place in his underwear to collect the drainage.    Laurell Josephsammy  K Estee Yohe, RN

## 2017-03-07 ENCOUNTER — Emergency Department (HOSPITAL_COMMUNITY)
Admission: EM | Admit: 2017-03-07 | Discharge: 2017-03-07 | Disposition: A | Discharge status: Home / Self Care | Payer: Commercial Managed Care - PPO | Attending: Emergency Medicine | Admitting: Emergency Medicine

## 2017-03-07 ENCOUNTER — Encounter (HOSPITAL_COMMUNITY): Payer: Self-pay | Admitting: *Deleted

## 2017-03-07 ENCOUNTER — Other Ambulatory Visit: Payer: Self-pay

## 2017-03-07 DIAGNOSIS — Z7901 Long term (current) use of anticoagulants: Secondary | ICD-10-CM | POA: Diagnosis not present

## 2017-03-07 DIAGNOSIS — A630 Anogenital (venereal) warts: Secondary | ICD-10-CM | POA: Diagnosis present

## 2017-03-07 DIAGNOSIS — Z79899 Other long term (current) drug therapy: Secondary | ICD-10-CM | POA: Diagnosis not present

## 2017-03-07 DIAGNOSIS — I5022 Chronic systolic (congestive) heart failure: Secondary | ICD-10-CM | POA: Insufficient documentation

## 2017-03-07 DIAGNOSIS — B20 Human immunodeficiency virus [HIV] disease: Secondary | ICD-10-CM | POA: Insufficient documentation

## 2017-03-07 DIAGNOSIS — R21 Rash and other nonspecific skin eruption: Secondary | ICD-10-CM | POA: Insufficient documentation

## 2017-03-07 DIAGNOSIS — Z87891 Personal history of nicotine dependence: Secondary | ICD-10-CM | POA: Diagnosis not present

## 2017-03-07 MED ORDER — HYDROCODONE-ACETAMINOPHEN 5-325 MG PO TABS: 1.0000 | ORAL_TABLET | Freq: Four times a day (QID) | ORAL | 0 refills | Status: DC | PRN

## 2017-03-07 MED ORDER — LIDOCAINE 4 % EX CREA: 1.0000 "application " | TOPICAL_CREAM | Freq: Three times a day (TID) | CUTANEOUS | 0 refills | Status: DC | PRN

## 2017-03-07 MED ORDER — DOXYCYCLINE HYCLATE 100 MG PO CAPS: 100.0000 mg | ORAL_CAPSULE | Freq: Two times a day (BID) | ORAL | 0 refills | Status: AC

## 2017-03-07 MED ORDER — MUPIROCIN CALCIUM 2 % EX CREA: 1.0000 "application " | TOPICAL_CREAM | Freq: Two times a day (BID) | CUTANEOUS | 0 refills | Status: DC

## 2017-03-07 NOTE — ED Provider Notes (Signed)
[facility] EMERGENCY DEPARTMENT Provider Note   CSN: [id number] Arrival date & time: 03/07/17  1636     History   Chief Complaint Chief Complaint  Patient presents with  . Rash  . Groin Pain    HPI [name] P Footman is a 46 y.o. male.  HPI 46 year old male with history of HIV AIDS who presents with genital rash.  The patient follows with ID clinic here.  He currently is well controlled on antiretroviral therapy.  However, he has had persistent issues with genital warts has been progressively worsening.  He is here because his warts have not improved despite starting outpatient imiquimod.  The patient states that he has had increasing pain and swelling to his genital area.  These are the same rash and warts that have been there for at least the last several months, but they are progressive worsening.  He is here requesting removal.  He denies any new drainage.  He states the pain is a sharp, burning sensation that is worse with any kind of movement.  Is limiting his ability to sleep.  He continues to use the cream.  He was previously referred to urology per review of clinic notes, but has not followed up yet.  Denies any drainage.  Denies any fevers or chills.  Denies any pain in his penis or scrotum.  Past Medical History:  Diagnosis Date  . Depression    "stress and depression for any man is common" (03/21/2014)  . Genital warts 01/04/2017  . Hepatitis    "I don't know what hepatitis I have"  . HIV disease ([name])   . TB (pulmonary tuberculosis)    previously treated according to refugee documentation    Patient Active Problem List   Diagnosis Date Noted  . Genital warts 01/04/2017  . 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 Chronic systolic CHF (congestive heart failure) ([name]) 04/24/2016  . AKI (acute kidney injury) ([name]) 04/24/2016  . Flu 04/24/2016  . 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 Hypoxia   .  Acute respiratory failure with hypoxia ([name]) 02/25/2016  . Normochromic normocytic anemia 02/25/2016  . Pneumonia 02/25/2016  . Protein-calorie malnutrition, severe 02/25/2016  . PE (pulmonary thromboembolism) ([name]) 02/24/2016  . Renal insufficiency   . Surgery, elective   . AIDS ([name])   . S/P ORIF (open reduction internal fixation) fracture 03/21/2014  . Low back pain 10/10/2013  . Multifocal pneumonia 09/27/2013  . HIV disease ([name]) 09/18/2013  . Refugee health examination 09/18/2013  . Cough 09/18/2013    Past Surgical History:  Procedure Laterality Date  . FRACTURE SURGERY    . IM NAILING TIBIA Right 03/21/2014  . ORIF ANKLE FRACTURE Right 03/21/2014   lateral malleolus/notes 03/21/2014  . ORIF ANKLE FRACTURE Right 03/21/2014   Procedure: OPEN REDUCTION INTERNAL FIXATION (ORIF) pilon ;  Surgeon: Eldred Manges, MD;  Location: [facility] OR;  Service: Orthopedics;  Laterality: Right;  . TIBIA IM NAIL INSERTION Right 03/21/2014   Procedure: INTRAMEDULLARY (IM) NAIL TIBIAL;  Surgeon: Eldred Manges, MD;  Location: [facility] OR;  Service: Orthopedics;  Laterality: Right;  Marland Kitchen VIDEO BRONCHOSCOPY Bilateral 03/02/2016   Procedure: VIDEO BRONCHOSCOPY WITHOUT FLUORO;  Surgeon: Roslynn Amble, MD;  Location[facility] ENDOSCOPY;  Service: Cardiopulmonary;  Laterality: Bilateral;       Home Medications    Prior to Admission medications   Medication Sig Start Date End Date Taking? Authorizing Provider  darunavir-cobicistat (PREZCOBIX) 800-150  MG tablet Take 1 tablet by mouth daily with breakfast. 02/17/17   Kuppelweiser, Cassie L, RPH-CPP  dolutegravir (TIVICAY) 50 MG tablet Take 1 tablet (50 mg total) by mouth daily. 02/17/17   Kuppelweiser, Cassie L, RPH-CPP  doxycycline (VIBRAMYCIN) 100 MG capsule Take 1 capsule (100 mg total) by mouth 2 (two) times daily for 7 days. 03/07/17 03/14/17  Shaune PollackIsaacs, Lucio Litsey, MD  ELIQUIS 2.5 MG TABS tablet TAKE 1 TABLET BY MOUTH TWICE DAILY 11/09/16   Tobey GrimWalden, Jeffrey H, MD  feeding  supplement (BOOST / RESOURCE BREEZE) LIQD Take 1 Container by mouth 3 (three) times daily between meals. 04/24/16   Leroy SeaSingh, Prashant K, MD  guaiFENesin (ROBITUSSIN) 100 MG/5ML SOLN Take 5 mLs (100 mg total) by mouth every 4 (four) hours as needed for cough or to loosen phlegm. 02/17/17   Judyann MunsonSnider, Cynthia, MD  HYDROcodone-acetaminophen (NORCO/VICODIN) 5-325 MG tablet Take 1-2 tablets by mouth every 6 (six) hours as needed for severe pain. 03/07/17   Shaune PollackIsaacs, Tab Rylee, MD  imiquimod (ALDARA) 5 % cream APPLY TOPICALLY 3 (THREE) TIMES A WEEK. 01/20/17   Judyann MunsonSnider, Cynthia, MD  lidocaine (LMX) 4 % cream Apply 1 application topically 3 (three) times daily as needed. Apply to genital sores for pain relief as needed 03/07/17   Shaune PollackIsaacs, Cathrine Krizan, MD  mupirocin cream (BACTROBAN) 2 % Apply 1 application topically 2 (two) times daily. Apply to open/sore areas to help with pain and prevent infection 03/07/17   Shaune PollackIsaacs, Ryver Poblete, MD  ondansetron (ZOFRAN) 4 MG tablet Take 1 tablet (4 mg total) by mouth every 8 (eight) hours as needed. kichefuchefu (nausea in swahili) 12/03/16   Judyann MunsonSnider, Cynthia, MD  sulfamethoxazole-trimethoprim (BACTRIM DS,SEPTRA DS) 800-160 MG tablet TAKE 1 TABLET BY MOUTH ONCE DAILY 10/13/16   Judyann MunsonSnider, Cynthia, MD    Family History Family History  Problem Relation Age of Onset  . Hypertension Other   . Heart disease Sister     Social History Social History   Tobacco Use  . Smoking status: Former Smoker    Packs/day: 0.50    Years: 6.00    Pack years: 3.00    Types: Cigarettes    Last attempt to quit: 03/16/2001    Years since quitting: 15.9  . Smokeless tobacco: Never Used  . Tobacco comment: "quit smoking ~ 2003"  Substance Use Topics  . Alcohol use: Yes    Comment: drinks bottled beer intermittently  . Drug use: No     Allergies   Patient has no known allergies.   Review of Systems Review of Systems  Constitutional: Negative for chills, fatigue and fever.  HENT: Negative for congestion  and rhinorrhea.   Eyes: Negative for visual disturbance.  Respiratory: Negative for cough, shortness of breath and wheezing.   Cardiovascular: Negative for chest pain and leg swelling.  Gastrointestinal: Negative for abdominal pain, diarrhea, nausea and vomiting.  Genitourinary: Negative for dysuria and flank pain.  Musculoskeletal: Negative for neck pain and neck stiffness.  Skin: Positive for rash. Negative for wound.  Allergic/Immunologic: Negative for immunocompromised state.  Neurological: Negative for syncope, weakness and headaches.  All other systems reviewed and are negative.    Physical Exam Updated Vital Signs BP 140/87 (BP Location: Right Arm)   Pulse (!) 101   Temp 98.5 F (36.9 C) (Oral)   Resp 18   Ht 5' 8.9" (1.75 m)   Wt 75 kg (165 lb 5.5 oz)   SpO2 97%   BMI 24.49 kg/m   Physical Exam  Constitutional: He is  oriented to person, place, and time. He appears well-developed and well-nourished. No distress.  HENT:  Head: Normocephalic and atraumatic.  No oral pharyngeal plaques or lesions  Eyes: Conjunctivae are normal.  Neck: Neck supple.  Cardiovascular: Normal rate, regular rhythm and normal heart sounds. Exam reveals no friction rub.  No murmur heard. Pulmonary/Chest: Effort normal and breath sounds normal. No respiratory distress. He has no wheezes. He has no rales.  Abdominal: He exhibits no distension.  Genitourinary:  Genitourinary Comments: Severe, bulky genital warts to bilateral inguinal folds.  There are some secondary excoriations and open, superficial cracks due to hyperkeratotic lesions, but no overt purulence, warmth, or discharge.  There is no fluctuance.  There is no extension onto the scrotum or penis.  No crepitance.  No surrounding pain or tenderness.  Musculoskeletal: He exhibits no edema.  Neurological: He is alert and oriented to person, place, and time. He exhibits normal muscle tone.  Skin: Skin is warm. Capillary refill takes less than  2 seconds.  Psychiatric: He has a normal mood and affect.  Nursing note and vitals reviewed.    ED Treatments / Results  Labs (all labs ordered are listed, but only abnormal results are displayed) Labs Reviewed - No data to display  EKG  EKG Interpretation None       Radiology No results found.  Procedures Procedures (including critical care time)  Medications Ordered in ED Medications - No data to display   Initial Impression / Assessment and Plan / ED Course  I have reviewed the triage vital signs and the nursing notes.  Pertinent labs & imaging results that were available during my care of the patient were reviewed by me and considered in my medical decision making (see chart for details).     46 year old male here with progressively worsening genital warts.  Unfortunately, this is likely due to his history of HIV/AIDS.  He is already on topical imiquimod.  I suspect he will ultimately need surgical removal.  I discussed that we cannot perform this in the emergency department and that he needs to follow-up with urology, which is been scheduled by his infectious disease physician.  I urged him to continue the imiquimod.  He does have some areas of superficial excoriations likely due to the bulkiness of the warts.  Will give him topical lidocaine as well as topical mupirocin to prevent infection as well as treat symptomatic pain.  Given that he has diffuse tenderness of this area, cannot rule out possible underlying or superimposed cellulitis so will start on doxycycline.  This should cover for any kind of venereal disease as well.  Otherwise, he has no apparent extension to the scrotum or penis.  No evidence to suggest Fournier's.  He is systemically well appearing without sepsis.  Final Clinical Impressions(s) / ED Diagnoses   Final diagnoses:  Genital warts    ED Discharge Orders        Ordered    lidocaine (LMX) 4 % cream  3 times daily PRN     03/07/17 1904     mupirocin cream (BACTROBAN) 2 %  2 times daily     03/07/17 1904    HYDROcodone-acetaminophen (NORCO/VICODIN) 5-325 MG tablet  Every 6 hours PRN     03/07/17 1904    doxycycline (VIBRAMYCIN) 100 MG capsule  2 times daily     03/07/17 1904       Shaune PollackIsaacs, Kalieb Freeland, MD 03/07/17 1925

## 2017-03-07 NOTE — ED Triage Notes (Addendum)
Pt reports pain and rash to groin and pelvic area x 5 days. Unable to sleep due to pain. Hx of genital warts.

## 2017-03-07 NOTE — Discharge Instructions (Signed)
Your pain and symptoms are due to genital warts.  It is very important that you follow-up with urology.  Dr. Barkley BrunsSynder's note states that you have follow-up soon.   I suspect you may need surgery to help with your symptoms. At this time, continue the Aldara cream but due to the extent of your warts, surgical treatment may be necessary. The treatment today is to help with pain and possible infection, but will likely NOT get rid of the warts.

## 2017-03-19 NOTE — Telephone Encounter (Signed)
The intent of this communication is to inform the Health Care Team that this patient will be discharged [facility]  Greater than 3 attempts have been made to re-engage the patient  without any success .Moving forward, the [facility] will be willing to reopen the patient to services if and when the patient is ready to discuss medication adherence and HIV disease  management. Effective 10/23/16 patient will be discharged and [facility].

## 2017-03-29 ENCOUNTER — Ambulatory Visit (INDEPENDENT_AMBULATORY_CARE_PROVIDER_SITE_OTHER): Payer: Commercial Managed Care - PPO | Admitting: Pharmacist

## 2017-03-29 DIAGNOSIS — B20 Human immunodeficiency virus [HIV] disease: Secondary | ICD-10-CM

## 2017-03-29 DIAGNOSIS — Z9119 Patient's noncompliance with other medical treatment and regimen: Secondary | ICD-10-CM | POA: Diagnosis not present

## 2017-03-29 DIAGNOSIS — Z91199 Patient's noncompliance with other medical treatment and regimen due to unspecified reason: Secondary | ICD-10-CM

## 2017-03-29 NOTE — Progress Notes (Signed)
[facility] Pharmacy Visit  HPI: [name] is a 47 y.o. male who presents to the [facility] pharmacy clinic for HIV follow-up.  Patient Active Problem List   Diagnosis Date Noted  . Genital warts 01/04/2017  . 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 Chronic systolic CHF (congestive heart failure) ([name]) 04/24/2016  . AKI (acute kidney injury) ([name]) 04/24/2016  . Flu 04/24/2016  . 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 Hypoxia   . Acute respiratory failure with hypoxia ([name]) 02/25/2016  . Normochromic normocytic anemia 02/25/2016  . Pneumonia 02/25/2016  . Protein-calorie malnutrition, severe 02/25/2016  . PE (pulmonary thromboembolism) ([name]) 02/24/2016  . Renal insufficiency   . Surgery, elective   . AIDS ([name])   . S/P ORIF (open reduction internal fixation) fracture 03/21/2014  . Low back pain 10/10/2013  . Multifocal pneumonia 09/27/2013  . HIV disease ([name]) 09/18/2013  . Refugee health examination 09/18/2013  . Cough 09/18/2013    Patient's Medications  New Prescriptions   No medications on file  Previous Medications   DARUNAVIR-COBICISTAT (PREZCOBIX) 800-150 MG TABLET    Take 1 tablet by mouth daily with breakfast.   DOLUTEGRAVIR (TIVICAY) 50 MG TABLET    Take 1 tablet (50 mg total) by mouth daily.   FEEDING SUPPLEMENT (BOOST / RESOURCE BREEZE) LIQD    Take 1 Container by mouth 3 (three) times daily between meals.   GUAIFENESIN (ROBITUSSIN) 100 MG/5ML SOLN    Take 5 mLs (100 mg total) by mouth every 4 (four) hours as needed for cough or to loosen phlegm.   HYDROCODONE-ACETAMINOPHEN (NORCO/VICODIN) 5-325 MG TABLET    Take 1-2 tablets by mouth every 6 (six) hours as needed for severe pain.   IMIQUIMOD (ALDARA) 5 % CREAM    APPLY TOPICALLY 3 (THREE) TIMES A WEEK.   LIDOCAINE (LMX) 4 % CREAM    Apply 1 application topically 3 (three) times daily as needed. Apply to genital sores for pain relief as  needed   MUPIROCIN CREAM (BACTROBAN) 2 %    Apply 1 application topically 2 (two) times daily. Apply to open/sore areas to help with pain and prevent infection   ONDANSETRON (ZOFRAN) 4 MG TABLET    Take 1 tablet (4 mg total) by mouth every 8 (eight) hours as needed. kichefuchefu (nausea in swahili)   SULFAMETHOXAZOLE-TRIMETHOPRIM (BACTRIM DS,SEPTRA DS) 800-160 MG TABLET    TAKE 1 TABLET BY MOUTH ONCE DAILY  Modified Medications   No medications on file  Discontinued Medications   ELIQUIS 2.5 MG TABS TABLET    TAKE 1 TABLET BY MOUTH TWICE DAILY    Allergies: No Known Allergies  Past Medical History: Past Medical History:  Diagnosis Date  . Depression    "stress and depression for any man is common" (03/21/2014)  . Genital warts 01/04/2017  . Hepatitis    "I don't know what hepatitis I have"  . HIV disease ([name])   . TB (pulmonary tuberculosis)    previously treated according to refugee documentation    Social History: Social History   Socioeconomic History  . Marital status: Single    Spouse name: Not on file  . Number of children: Not on file  . Years of education: Not on file  . Highest education level: Not on file  Social Needs  . Financial resource strain: Not on file  . Food insecurity - worry: Not on file  . Food insecurity - inability:  Not on file  . Transportation needs - medical: Not on file  . Transportation needs - non-medical: Not on file  Occupational History  . Not on file  Tobacco Use  . Smoking status: Former Smoker    Packs/day: 0.50    Years: 6.00    Pack years: 3.00    Types: Cigarettes    Last attempt to quit: 03/16/2001    Years since quitting: 16.0  . Smokeless tobacco: Never Used  . Tobacco comment: "quit smoking ~ 2003"  Substance and Sexual Activity  . Alcohol use: Yes    Comment: drinks bottled beer intermittently  . Drug use: No  . Sexual activity: Not Currently    Partners: Female  Other Topics Concern  . Not on file  Social History  Narrative   As of 09/18/2013:   -Arrived in [place]: [place] 05, [place]    -[place] [facility] years [place]    -Language: [name], requires Architectwahili intepreter (speaks essentially no [place])   -Education: no formal education, previously worked as a Product/process development scientistdriver   -Contact: family phone number [phone], caseworker is Lannette DonathFa Ri Dar Bi [phone] (with [employer])   -lives with daughter and three sons   -denies alcohol, drug, tobacco abuse      [facility] (02/28/16):   Patient now admitted to drinking bottled beer. Reports continued tobacco abstinence. Denies any bird or mold exposure. No pets currently. No recent travel.    Labs: HIV 1 RNA Quant (copies/mL)  Date Value  01/26/2017 38 (H)  12/28/2016 241 (H)  11/30/2016 373,000 (H)   CD4 T Cell Abs (/uL)  Date Value  01/26/2017 50 (L)  12/28/2016 40 (L)  11/30/2016 20 (L)   Hepatitis B Surface Ag (no units)  Date Value  09/26/2013 NEGATIVE   HCV Ab (no units)  Date Value  03/22/2014 NEGATIVE    Lipids:    Component Value Date/Time   CHOL 121 03/23/2014 0603   TRIG 27 03/23/2014 0603   HDL 37 (L) 03/23/2014 0603   CHOLHDL 3.3 03/23/2014 0603   VLDL 5 03/23/2014 0603   LDLCALC 79 03/23/2014 0603    Current HIV Regimen: Tivicay + Prezcobix  Assessment: [name] is here today to follow-up with me for his HIV infection.  He has had a longstanding issue with resistance and adherence problems.  We were finally able to get him on a regimen that he can tolerate - Prezcobix + Tivicay.  He comes in today still complaining about being in a great deal of pain due to his genital warts.  He states he went to his urology appointment back in December but they did not see him and rescheduled him for February 7th at 2pm.  He is telling me today that is too long to wait for how much pain he is in. He is asking me to prescribe him something for pain. I told him that I was sorry he was in so much pain, but I  could not prescribe anything for him.  Otherwise, he states he is having no issues with his Prezcobix and Tivicay. No side effects or GI issues and he states no missed doses.  I will schedule him with Dr. Drue SecondSnider for ~6 weeks and get labs today for adherence.  All of this was through interpret [id number].  Plan: - Continue Prezcobix + Tivicay - HIV RNA and CD4 today - F/u with Dr. Drue SecondSnider 3/4 at 315pm  Akelia Husted L. Sheridan Hew, PharmD, AAHIVP,  CPP Infectious Diseases Clinical Pharmacist [facility] 03/29/2017, 3:12 PM

## 2017-03-30 LAB — T-HELPER CELL (CD4) - (RCID CLINIC ONLY)
CD4 T CELL ABS: 90 /uL — AB (ref 400–2700)
CD4 T CELL HELPER: 5 % — AB (ref 33–55)

## 2017-03-31 LAB — HIV-1 RNA QUANT-NO REFLEX-BLD
HIV 1 RNA QUANT: 10100 {copies}/mL — AB
HIV-1 RNA QUANT, LOG: 4 {Log_copies}/mL — AB

## 2017-03-31 NOTE — Progress Notes (Signed)
Ugh - his viral load is back up again. He tells me he isn't having any issues or side effects and is taking it every day.[name].[name]..[name]

## 2017-04-01 ENCOUNTER — Telehealth: Payer: Self-pay | Admitting: Pharmacist Clinician (PhC)/ Clinical Pharmacy Specialist

## 2017-04-01 NOTE — Telephone Encounter (Signed)
[name] was calling him for refill of his anti-retrovirals. He saw [name] last week and his VL is well out of control again. He said that he has been taking his meds but I asked the interpreter to be honest with [name] about missing doses. He admitted to missing like a week of medication. He probably missed more than that. Bringing him back in 2 wks to repeat labs.

## 2017-04-15 ENCOUNTER — Ambulatory Visit (INDEPENDENT_AMBULATORY_CARE_PROVIDER_SITE_OTHER): Payer: Commercial Managed Care - PPO | Admitting: Pharmacist Clinician (PhC)/ Clinical Pharmacy Specialist

## 2017-04-15 DIAGNOSIS — B20 Human immunodeficiency virus [HIV] disease: Secondary | ICD-10-CM

## 2017-04-15 NOTE — Progress Notes (Signed)
HPI: [name] is a 47 y.o. male who is here to see pharmacy for adherence again.   Allergies: No Known Allergies  Vitals:    Past Medical History: Past Medical History:  Diagnosis Date  . Depression    "stress and depression for any man is common" (03/21/2014)  . Genital warts 01/04/2017  . Hepatitis    "I don't know what hepatitis I have"  . HIV disease ([name])   . TB (pulmonary tuberculosis)    previously treated according to refugee documentation    Social History: Social History   Socioeconomic History  . Marital status: Single    Spouse name: Not on file  . Number of children: Not on file  . Years of education: Not on file  . Highest education level: Not on file  Social Needs  . Financial resource strain: Not on file  . Food insecurity - worry: Not on file  . Food insecurity - inability: Not on file  . Transportation needs - medical: Not on file  . Transportation needs - non-medical: Not on file  Occupational History  . Not on file  Tobacco Use  . Smoking status: Former Smoker    Packs/day: 0.50    Years: 6.00    Pack years: 3.00    Types: Cigarettes    Last attempt to quit: 03/16/2001    Years since quitting: 16.0  . Smokeless tobacco: Never Used  . Tobacco comment: "quit smoking ~ 2003"  Substance and Sexual Activity  . Alcohol use: Yes    Comment: drinks bottled beer intermittently  . Drug use: No  . Sexual activity: Not Currently    Partners: Female  Other Topics Concern  . Not on file  Social History Narrative   As of 09/18/2013:   -Arrived in [place]: [place] 05, [place]    -[facility], spent [place] years in refugee [place] in [place].    -Language: [name], requires Architectwahili intepreter (speaks essentially no [place])   -Education: no formal education, previously worked as a Product/process development scientistdriver   -Contact: family phone number [phone], caseworker is Lannette DonathFa Ri Dar Bi [phone] (with [employer])   -lives with daughter and three sons   -denies alcohol, drug,  tobacco abuse      [facility] (02/28/16):   Patient now admitted to drinking bottled beer. Reports continued tobacco abstinence. Denies any bird or mold exposure. No pets currently. No recent travel.    Previous Regimen: DTG 1 PO qday AZT 300mg  PO BID Prezcobix 1 PO qday  Current Regimen: Prezcobix/Tivicay  Labs: HIV 1 RNA Quant (copies/mL)  Date Value  03/29/2017 10,100 (H)  01/26/2017 38 (H)  12/28/2016 241 (H)   CD4 T Cell Abs (/uL)  Date Value  03/29/2017 90 (L)  01/26/2017 50 (L)  12/28/2016 40 (L)   Hepatitis B Surface Ag (no units)  Date Value  09/26/2013 NEGATIVE   HCV Ab (no units)  Date Value  03/22/2014 NEGATIVE    CrCl: CrCl cannot be calculated (Patient's most recent lab result is older than the maximum 21 days allowed.).  Lipids:    Component Value Date/Time   CHOL 121 03/23/2014 0603   TRIG 27 03/23/2014 0603   HDL 37 (L) 03/23/2014 0603   CHOLHDL 3.3 03/23/2014 0603   VLDL 5 03/23/2014 0603   LDLCALC 79 03/23/2014 0603   HIV Genotype Composite Data Genotype Dates:   Mutations in Bold impact drug susceptibility RT Mutations K65R, D67G, M184V, T215I, K219E, A98G, L100I, K103NS,  V108I, P225H  PI Mutations T74S  Integrase Mutations None   Interpretation of Genotype Data per [facility] HIV Database  Nucleoside RTIs  lamivudine (3TC)High-level resistance abacavir (ABC)High-level resistance zidovudine (AZT)Low-level resistance stavudine (D4T)High-level resistance didanosine (DDI)High-level resistance emtricitabine (FTC)High-level resistance tenofovir (TDF)High-level resistance   Non-Nucleoside RTIs  efavirenz (EFV)High-level resistance etravirine (ETR)Intermediate resistance nevirapine (NVP)High-level resistance rilpivirine (RPV)High-level resistance   Protease Inhibitors  atazanavir/r (ATV/r)Susceptible darunavir/r (DRV/r)Susceptible fosamprenavir/r  (FPV/r)Susceptible indinavir/r (IDV/r)Susceptible lopinavir/r (LPV/r)Susceptible nelfinavir (NFV)Low-level resistance saquinavir/r (SQV/r)Susceptible tipranavir/r (TPV/r)Susceptible   Integrase Inhibitors  Raltegravir Resistance No range found NOT PREDICTED  Elvitegravir Resistance No range found NOT PREDICTED  Dolutegravir Resistance No range found NOT PREDICTED      Assessment: [name] has a terrible hx of adherence. He is on dual therapy for his resistance issue. He recently saw Cassie for a check up and of course, his VL was over 10k again. He admitted to missing over a week of meds. He said that he simply forgets to take it. We have tried the pill box strategy in the past without much success. Showed him the result today of his labs. He is just terrible with his compliance. He said that he has been taking it consistently for the past 3 wks. We will repeat labs today.  He still continue to complains of his genital warts. He has an appt with [facility] on 2/7. Told him that he must make that appt if he wants to get better. He has an appt set up with Dr. Drue Second in 1 month. We can repeat labs then.   Recommendations:  Continue Prezcobix/Tivicay daily with food F/u with urology Labs today F/u with Dr. Drue Second next month  Ulyses Southward, PharmD, BCPS, AAHIVP, CPP Clinical Infectious Disease Pharmacist [facility] 04/15/2017, 3:20 PM

## 2017-04-15 NOTE — Patient Instructions (Signed)
Kuch[place] vidonge 2 kila masaa 6 kama inahitajika kwa maumivu  TYLENOL

## 2017-04-16 LAB — T-HELPER CELL (CD4) - (RCID CLINIC ONLY)
CD4 T CELL HELPER: 4 % — AB (ref 33–55)
CD4 T Cell Abs: 100 /uL — ABNORMAL LOW (ref 400–2700)

## 2017-04-17 LAB — HIV-1 RNA ULTRAQUANT REFLEX TO GENTYP+
HIV 1 RNA QUANT: 78 {copies}/mL — AB
HIV-1 RNA QUANT, LOG: 1.89 {Log_copies}/mL — AB

## 2017-05-04 ENCOUNTER — Encounter: Payer: Self-pay | Admitting: Family

## 2017-05-04 ENCOUNTER — Ambulatory Visit (INDEPENDENT_AMBULATORY_CARE_PROVIDER_SITE_OTHER): Payer: Commercial Managed Care - PPO | Admitting: Family

## 2017-05-04 VITALS — BP 108/76 | HR 92 | Temp 97.7°F

## 2017-05-04 DIAGNOSIS — A630 Anogenital (venereal) warts: Secondary | ICD-10-CM

## 2017-05-04 MED ORDER — LIDOCAINE 4 % EX CREA: 1.0000 "application " | TOPICAL_CREAM | Freq: Three times a day (TID) | CUTANEOUS | 0 refills | Status: DC | PRN

## 2017-05-04 NOTE — Progress Notes (Signed)
Subjective:    Patient ID: [name], male    DOB: [birth year], 47 y.o.   MRN: [id number]  Chief Complaint  Patient presents with  . Genital Warts     HPI:  [name] is a 47 y.o. male who presents today for an acute office visit. A [name] translator was present via computer for translation.   Mr. Kimrey continues to experience the associated symptom of pain located in his bilateral groins from genital warts that has been refractory to Aldara cream and progressively worsening over the course of the past month. Denies any fevers, chills, or night sweats. There is currently occasionally oily discharge. Had an appointment with Urology who he indicates that the provider he saw noted that he was not able to do anything to help. He does not have any creams remaining at the present time. Continues to note that they do not help at all.   No Known Allergies    Outpatient Medications Prior to Visit  Medication Sig Dispense Refill  . darunavir-cobicistat (PREZCOBIX) 800-150 MG tablet Take 1 tablet by mouth daily with breakfast. 30 tablet 5  . dolutegravir (TIVICAY) 50 MG tablet Take 1 tablet (50 mg total) by mouth daily. 30 tablet 5  . feeding supplement (BOOST / RESOURCE BREEZE) LIQD Take 1 Container by mouth 3 (three) times daily between meals. 90 Container 0  . imiquimod (ALDARA) 5 % cream APPLY TOPICALLY 3 (THREE) TIMES A WEEK. 12 each 0  . mupirocin cream (BACTROBAN) 2 % Apply 1 application topically 2 (two) times daily. Apply to open/sore areas to help with pain and prevent infection 30 g 0  . lidocaine (LMX) 4 % cream Apply 1 application topically 3 (three) times daily as needed. Apply to genital sores for pain relief as needed 120 g 0  . guaiFENesin (ROBITUSSIN) 100 MG/5ML SOLN Take 5 mLs (100 mg total) by mouth every 4 (four) hours as needed for cough or to loosen phlegm. (Patient not taking: Reported on 05/04/2017) 1200 mL 0  . HYDROcodone-acetaminophen (NORCO/VICODIN)  5-325 MG tablet Take 1-2 tablets by mouth every 6 (six) hours as needed for severe pain. (Patient not taking: Reported on 05/04/2017) 12 tablet 0  . ondansetron (ZOFRAN) 4 MG tablet Take 1 tablet (4 mg total) by mouth every 8 (eight) hours as needed. kichefuchefu (nausea in swahili) 20 tablet 3  . sulfamethoxazole-trimethoprim (BACTRIM DS,SEPTRA DS) 800-160 MG tablet TAKE 1 TABLET BY MOUTH ONCE DAILY (Patient not taking: Reported on 05/04/2017) 30 tablet 2   No facility-administered medications prior to visit.      Past Medical History:  Diagnosis Date  . Depression    "stress and depression for any man is common" (03/21/2014)  . Genital warts 01/04/2017  . Hepatitis    "I don't know what hepatitis I have"  . HIV disease ([name])   . TB (pulmonary tuberculosis)    previously treated according to refugee documentation    Review of Systems  Constitutional: Negative for appetite change, chills, fatigue and fever.  Respiratory: Negative for chest tightness and shortness of breath.   Cardiovascular: Negative for chest pain, palpitations and leg swelling.  Genitourinary:       Positive for genital warts  Skin: Positive for rash.      Objective:    BP 108/76   Pulse 92   Temp 97.7 F (36.5 C)  Nursing note and vital signs reviewed.  Physical Exam  Constitutional: He is oriented to person, place, and time.  He appears well-developed and well-nourished. He appears distressed.  Cardiovascular: Normal rate, regular rhythm, normal heart sounds and intact distal pulses.  Pulmonary/Chest: Effort normal and breath sounds normal. No respiratory distress. He has no wheezes. He has no rales. He exhibits no tenderness.  Genitourinary: Penis normal.  Genitourinary Comments: Multiple very large cauliflower shaped lesions located on bilateral groins which appear raw with redness across the top.    Neurological: He is alert and oriented to person, place, and time.  Skin: Skin is warm and dry.    Psychiatric: He has a normal mood and affect. His behavior is normal. Judgment and thought content normal.       Assessment & Plan:   Problem List Items Addressed This Visit      Musculoskeletal and Integument   Condylomata acuminata in male - Primary    Multiple large condylomata acuminata lesions located on bilateral groins and lower part of abdomen that have been refractory to Aldara cream. These sites will require a surgical procedure to remove and heal. We attempted to get him in with General Surgery but no appointments were available for at least 1 week. It is likely in his best interest to go to the ED for further evaluation. Lidocaine cream was prescribed to help with the discomfort and abdominal pads were placed in his groin to help reduce friction and rubbing.           I am having [name] maintain his feeding supplement, sulfamethoxazole-trimethoprim, ondansetron, imiquimod, darunavir-cobicistat, dolutegravir, guaiFENesin, mupirocin cream, HYDROcodone-acetaminophen, and lidocaine.   Meds ordered this encounter  Medications  . DISCONTD: lidocaine (LMX) 4 % cream    Sig: Apply 1 application topically 3 (three) times daily as needed. Apply to genital sores for pain relief as needed    Dispense:  120 g    Refill:  0    Order Specific Question:   Supervising Provider    Answer:   Judyann MunsonSNIDER, CYNTHIA [[id number]]  . lidocaine (LMX) 4 % cream    Sig: Apply 1 application topically 3 (three) times daily as needed. Apply to genital sores for pain relief as needed    Dispense:  120 g    Refill:  0    Order Specific Question:   Supervising Provider    Answer:   Judyann MunsonSNIDER, CYNTHIA [[id number]]     Follow-up: Follow up with Dr. Drue SecondSnider as scheduled or sooner if needed.   Anthony EkeGreg Calone, MSN, [facility]

## 2017-05-04 NOTE — Assessment & Plan Note (Signed)
Multiple large condylomata acuminata lesions located on bilateral groins and lower part of abdomen that have been refractory to Aldara cream. These sites will require a surgical procedure to remove and heal. We attempted to get him in with General Surgery but no appointments were available for at least 1 week. It is likely in his best interest to go to the ED for further evaluation. Lidocaine cream was prescribed to help with the discomfort and abdominal pads were placed in his groin to help reduce friction and rubbing.

## 2017-05-04 NOTE — Patient Instructions (Addendum)
Nice to meet you!  I have sent a lidocaine cream to your pharmacy to help with your discomfort.  The best available appointment may not be until next week.   I recommend you go to the ED for further treatment.

## 2017-05-05 ENCOUNTER — Telehealth: Payer: Self-pay | Admitting: *Deleted

## 2017-05-05 ENCOUNTER — Telehealth: Payer: Self-pay | Admitting: Behavioral Health

## 2017-05-05 ENCOUNTER — Telehealth: Payer: Self-pay | Admitting: Family

## 2017-05-05 NOTE — Telephone Encounter (Signed)
Writer called [facility] to see if patient could get a same day or next day appointment.  Spoke with the triage nurse and she states that there are no appointments available for the next few weeks.  She states she will call us back if there are any cancellations .  She was also given all of the patients contact information as well.  Writer will follow up in a few days to see is there is any availability. Angeline SlimAshley Clydell Alberts RN

## 2017-05-05 NOTE — Telephone Encounter (Signed)
Received a call from ChubbuckAshley Parsons/RN stating she spoke with Mr [name] about his upcoming appt and he asked for the information Surgery  to be sent to him in the form of a text message. Since we cannot text for a landline, Morrie Sheldonshley asked if  I could text the information. Text message sent to Mr [name] stating "appointment on Monday February 25th at 2:15pm [facility]. Address [address] [place]"

## 2017-05-05 NOTE — Telephone Encounter (Signed)
Please call [facility] to obtain records from [name]'s most recent visit. Thank you.

## 2017-05-05 NOTE — Telephone Encounter (Addendum)
Armen [facility] called back to let us know that an appointment is available Monday May 10, 2017 at 2:15pm in the [place] office for [name].  Writer will attempt to get in contact with [name].  [facility] in  [place]  [address]: [address] [facility] on the 2nd floor

## 2017-05-05 NOTE — Telephone Encounter (Signed)
Writer called [name] to give him the appointment date and time to his appointment at [facility] in [place].  Patient verbalized understanding.

## 2017-05-06 ENCOUNTER — Encounter: Payer: Self-pay | Admitting: Behavioral Health

## 2017-05-06 NOTE — Telephone Encounter (Signed)
error 

## 2017-05-07 ENCOUNTER — Telehealth: Payer: Self-pay | Admitting: *Deleted

## 2017-05-07 NOTE — Telephone Encounter (Signed)
Spoke with [name] and offer assistance again. He stated he is doing well and has a surgery planned. He asked me to text him the address for the surgery. I informed him that I had already texted the address, date and appt time to him but did not mind resending it if he needed me to. [name] declined stating he has it already then. We discussed his adherence and I asked what happen last month in relation to his elevated viral load. [name] was kinda vague and asked how his most recent lab work looked. We discussed his most recent lab work and it has improved from 10,000 to 78. I asked [name] if I could call him after his surgery to see if he could use some assistance and he agreed.

## 2017-05-11 ENCOUNTER — Telehealth: Payer: Self-pay | Admitting: Behavioral Health

## 2017-05-11 ENCOUNTER — Encounter: Payer: Self-pay | Admitting: Family

## 2017-05-11 ENCOUNTER — Ambulatory Visit (INDEPENDENT_AMBULATORY_CARE_PROVIDER_SITE_OTHER): Payer: Commercial Managed Care - PPO | Admitting: Family

## 2017-05-11 VITALS — BP 112/74 | HR 88 | Temp 97.5°F

## 2017-05-11 DIAGNOSIS — A630 Anogenital (venereal) warts: Secondary | ICD-10-CM

## 2017-05-11 MED ORDER — HYDROCODONE-ACETAMINOPHEN 5-325 MG PO TABS: 1.0000 | ORAL_TABLET | Freq: Three times a day (TID) | ORAL | 0 refills | Status: DC | PRN

## 2017-05-11 MED ORDER — LIDOCAINE 4 % EX CREA: 1.0000 "application " | TOPICAL_CREAM | Freq: Three times a day (TID) | CUTANEOUS | 0 refills | Status: DC | PRN

## 2017-05-11 NOTE — Assessment & Plan Note (Signed)
Extensive condylomata acuminata in bilateral groins. General surgery with concern for CD4 count of 100 and continued risk for recurrence as well as risk of scarring and decreased mobility. He has been referred to a tertiary center for further evaluation and treatment. We will resend the lidocaine cream and give a one-time prescription for hydrocodone-acetaminophen. [place] controlled substance database was reviewed with no irregularities or abnormalities.

## 2017-05-11 NOTE — Telephone Encounter (Signed)
[facility] and had to leave a voicemail for them the call us back in triage.  Writer wanted to speak to someone to check the status of patient's surgery referral and to inform them if they do call the patient that he speaks [name] and needs a [name] interpreter.   Angeline SlimAshley Duha Abair RN

## 2017-05-11 NOTE — Telephone Encounter (Signed)
RN called [facility] to get clarification of the patient's office visit yesterday.  [facility].  RN spoke to [facility] LPN and she states patient was seen by Dr. Maisie Fushomas yesterday 05/10/2017 and referred [facility] General [facility].  This information was relayed to patient via [name] interpreter on the Ipad.  Patient verbalized understanding. Writer also explained to patient that he needs to answer his phone and let [facility] know when they call that he needs an interpreter.  Patient verbalized understanding. Angeline SlimAshley Hill RN

## 2017-05-11 NOTE — Patient Instructions (Signed)
We will check on [facility].  Use the Norco as needed for breakthrough pain only that is not controlled by ibuprofen or Tylenol.

## 2017-05-11 NOTE — Progress Notes (Signed)
[name] came in today and stated he went to an [facility] [place] for his Genital W[place].  Patient stated nothing was done and they [facility].  RN called [facility] to get clarification and to get office visit notes faxed.  RN spoke with Hadelyn LPN in triage at [facility] and she states patient saw Dr. Maisie Fushomas yesterday and [facility].  RN then gave this information to [name] via [name] interpreter on the ipad.  Patient verbalized understanding. Angeline SlimAshley Parneet Glantz RN

## 2017-05-11 NOTE — Progress Notes (Signed)
Subjective:    Patient ID: [name], male    DOB: 08/07/1970, 47 y.o.   MRN: [id number]  Chief Complaint  Patient presents with  . Genital Warts     HPI:  [name] is a 47 y.o. male who presents today for an acute visit.   [name] was previously seen on 2/19 and found to have severe condylomata acuminata for which he was referred to general surgery. He was seen by Dr. Maisie Fushomas and advised of the need to be referred to a tertiary center for surgical intervention. He continues to have a significant amount of pain located in his groin. Severity of the pain effects his ability to sleep and to work. Previously prescribed lidocaine cream was not available at the pharmacy.    No Known Allergies    Outpatient Medications Prior to Visit  Medication Sig Dispense Refill  . darunavir-cobicistat (PREZCOBIX) 800-150 MG tablet Take 1 tablet by mouth daily with breakfast. 30 tablet 5  . dolutegravir (TIVICAY) 50 MG tablet Take 1 tablet (50 mg total) by mouth daily. 30 tablet 5  . feeding supplement (BOOST / RESOURCE BREEZE) LIQD Take 1 Container by mouth 3 (three) times daily between meals. 90 Container 0  . guaiFENesin (ROBITUSSIN) 100 MG/5ML SOLN Take 5 mLs (100 mg total) by mouth every 4 (four) hours as needed for cough or to loosen phlegm. (Patient not taking: Reported on 05/04/2017) 1200 mL 0  . imiquimod (ALDARA) 5 % cream APPLY TOPICALLY 3 (THREE) TIMES A WEEK. 12 each 0  . mupirocin cream (BACTROBAN) 2 % Apply 1 application topically 2 (two) times daily. Apply to open/sore areas to help with pain and prevent infection 30 g 0  . ondansetron (ZOFRAN) 4 MG tablet Take 1 tablet (4 mg total) by mouth every 8 (eight) hours as needed. kichefuchefu (nausea in swahili) 20 tablet 3  . sulfamethoxazole-trimethoprim (BACTRIM DS,SEPTRA DS) 800-160 MG tablet TAKE 1 TABLET BY MOUTH ONCE DAILY (Patient not taking: Reported on 05/04/2017) 30 tablet 2  . HYDROcodone-acetaminophen  (NORCO/VICODIN) 5-325 MG tablet Take 1-2 tablets by mouth every 6 (six) hours as needed for severe pain. (Patient not taking: Reported on 05/04/2017) 12 tablet 0  . lidocaine (LMX) 4 % cream Apply 1 application topically 3 (three) times daily as needed. Apply to genital sores for pain relief as needed 120 g 0   No facility-administered medications prior to visit.      Review of Systems  Constitutional: Negative for chills and fever.  Respiratory: Negative for chest tightness, shortness of breath and wheezing.   Cardiovascular: Negative for chest pain.  Genitourinary: Negative for discharge, dysuria, frequency, hematuria, penile pain, penile swelling, scrotal swelling, testicular pain and urgency.       Positive for genital warts      Objective:    BP 112/74   Pulse 88   Temp (!) 97.5 F (36.4 C) (Oral)  Nursing note and vital signs reviewed.  Physical Exam  Constitutional: He is oriented to person, place, and time. He appears well-developed and well-nourished. No distress.  Cardiovascular: Normal rate, regular rhythm, normal heart sounds and intact distal pulses. Exam reveals no gallop and no friction rub.  No murmur heard. Pulmonary/Chest: Effort normal and breath sounds normal. No respiratory distress. He has no wheezes. He has no rales. He exhibits no tenderness.  Genitourinary:  Genitourinary Comments: Exam unchanged from previous.  Neurological: He is alert and oriented to person, place, and time.  Skin: Skin is  warm and dry.  Psychiatric: He has a normal mood and affect. His behavior is normal. Judgment and thought content normal.       Assessment & Plan:   Problem List Items Addressed This Visit      Musculoskeletal and Integument   Condylomata acuminata in male - Primary    Extensive condylomata acuminata in bilateral groins. General surgery with concern for CD4 count of 100 and continued risk for recurrence as well as risk of scarring and decreased mobility. He has  been referred to a tertiary center for further evaluation and treatment. We will resend the lidocaine cream and give a one-time prescription for hydrocodone-acetaminophen. [name] controlled substance database was reviewed with no irregularities or abnormalities.          I have changed [name]'s HYDROcodone-acetaminophen. I am also having him maintain his feeding supplement, sulfamethoxazole-trimethoprim, ondansetron, imiquimod, darunavir-cobicistat, dolutegravir, guaiFENesin, mupirocin cream, and lidocaine.   Meds ordered this encounter  Medications  . lidocaine (LMX) 4 % cream    Sig: Apply 1 application topically 3 (three) times daily as needed. Apply to genital sores for pain relief as needed    Dispense:  120 g    Refill:  0    Order Specific Question:   Supervising Provider    Answer:   Judyann Munson [[id number]]  . HYDROcodone-acetaminophen (NORCO/VICODIN) 5-325 MG tablet    Sig: Take 1 tablet by mouth every 8 (eight) hours as needed for severe pain.    Dispense:  15 tablet    Refill:  0    Order Specific Question:   Supervising Provider    Answer:   Judyann Munson [[id number]]     Follow-up: Continue follow-up with Dr. Drue Second as scheduled  Marcos Eke, MSN, [facility]

## 2017-05-17 ENCOUNTER — Encounter: Payer: Self-pay | Admitting: Internal Medicine

## 2017-05-17 ENCOUNTER — Ambulatory Visit (INDEPENDENT_AMBULATORY_CARE_PROVIDER_SITE_OTHER): Payer: Commercial Managed Care - PPO | Admitting: Internal Medicine

## 2017-05-17 VITALS — BP 116/72 | HR 88 | Temp 98.4°F | Wt 176.0 lb

## 2017-05-17 DIAGNOSIS — N5089 Other specified disorders of the male genital organs: Secondary | ICD-10-CM

## 2017-05-17 DIAGNOSIS — B20 Human immunodeficiency virus [HIV] disease: Secondary | ICD-10-CM | POA: Diagnosis not present

## 2017-05-17 DIAGNOSIS — A630 Anogenital (venereal) warts: Secondary | ICD-10-CM | POA: Diagnosis not present

## 2017-05-17 MED ORDER — HYDROCODONE-ACETAMINOPHEN 5-325 MG PO TABS: 1.0000 | ORAL_TABLET | Freq: Two times a day (BID) | ORAL | 0 refills | Status: DC | PRN

## 2017-05-17 NOTE — Progress Notes (Signed)
RFV: follow up genital warts  Patient ID: [name], male   DOB: April 03, 1970, 47 y.o.   MRN: [id number]  HPI [name] is a 47yo M with advanced HIV disease, well controlled though he has been suffering from advanced condylomas affected bilateral groin/scrotal, inguinal area refractory to aldara cream. He was evaluated by both local general surgeons and urology who feel it would be complex undertaking and would recommend to be seen at tertiary center.  No word back from [facility]. He was last seen on 2/26, given 15# of hydrocodone/APAP to use Q 8hr, prn which he has been doing. Now out of meds, but has not heard from [facility] and losing hope.  Outpatient Encounter Medications as of 05/17/2017  Medication Sig  . darunavir-cobicistat (PREZCOBIX) 800-150 MG tablet Take 1 tablet by mouth daily with breakfast.  . dolutegravir (TIVICAY) 50 MG tablet Take 1 tablet (50 mg total) by mouth daily.  . feeding supplement (BOOST / RESOURCE BREEZE) LIQD Take 1 Container by mouth 3 (three) times daily between meals.  Marland Kitchen guaiFENesin (ROBITUSSIN) 100 MG/5ML SOLN Take 5 mLs (100 mg total) by mouth every 4 (four) hours as needed for cough or to loosen phlegm. (Patient not taking: Reported on 05/04/2017)  . HYDROcodone-acetaminophen (NORCO/VICODIN) 5-325 MG tablet Take 1 tablet by mouth every 8 (eight) hours as needed for severe pain.  Marland Kitchen imiquimod (ALDARA) 5 % cream APPLY TOPICALLY 3 (THREE) TIMES A WEEK.  Marland Kitchen lidocaine (LMX) 4 % cream Apply 1 application topically 3 (three) times daily as needed. Apply to genital sores for pain relief as needed  . mupirocin cream (BACTROBAN) 2 % Apply 1 application topically 2 (two) times daily. Apply to open/sore areas to help with pain and prevent infection  . ondansetron (ZOFRAN) 4 MG tablet Take 1 tablet (4 mg total) by mouth every 8 (eight) hours as needed. kichefuchefu (nausea in swahili)  . sulfamethoxazole-trimethoprim (BACTRIM DS,SEPTRA DS) 800-160 MG tablet TAKE 1 TABLET BY  MOUTH ONCE DAILY (Patient not taking: Reported on 05/04/2017)   No facility-administered encounter medications on file as of 05/17/2017.      Patient Active Problem List   Diagnosis Date Noted  . Condylomata acuminata in male 01/04/2017  . 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 Chronic systolic CHF (congestive heart failure) ([name]) 04/24/2016  . AKI (acute kidney injury) ([name]) 04/24/2016  . Flu 04/24/2016  . 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 Hypoxia   . Acute respiratory failure with hypoxia ([name]) 02/25/2016  . Normochromic normocytic anemia 02/25/2016  . Pneumonia 02/25/2016  . Protein-calorie malnutrition, severe 02/25/2016  . PE (pulmonary thromboembolism) ([name]) 02/24/2016  . Renal insufficiency   . Surgery, elective   . AIDS ([name])   . S/P ORIF (open reduction internal fixation) fracture 03/21/2014  . Low back pain 10/10/2013  . Multifocal pneumonia 09/27/2013  . HIV disease ([name]) 09/18/2013  . Refugee health examination 09/18/2013  . Cough 09/18/2013     Health Maintenance Due  Topic Date Due  . TETANUS/TDAP  03/16/1990     Review of Systems Pain in genital region from genital warts Physical Exam   BP 116/72   Pulse 88   Temp 98.4 F (36.9 C) (Oral)   Wt 176 lb (79.8 kg)   BMI 26.07 kg/m   Physical Exam  Constitutional: He is oriented to person, place, and time. He appears well-developed and well-nourished. No distress.  HENT:  Mouth/Throat: Oropharynx is clear and  moist. No oropharyngeal exudate.  Cardiovascular: Normal rate, regular rhythm and normal heart sounds. Exam reveals no gallop and no friction rub.  No murmur heard.  Pulmonary/Chest: Effort normal and breath sounds normal. No respiratory distress. He has no wheezes.  Abdominal: Soft. Bowel sounds are normal. He exhibits no distension. There is no tenderness.  gu = genital warts involving scrotum ingiunal region Skin: Skin is warm and dry. No rash  noted. No erythema.  Psychiatric: He has a normal mood and affect. His behavior is normal.    Lab Results  Component Value Date   CD4TCELL 4 (L) 04/15/2017   Lab Results  Component Value Date   CD4TABS 100 (L) 04/15/2017   CD4TABS 90 (L) 03/29/2017   CD4TABS 50 (L) 01/26/2017   Lab Results  Component Value Date   HIV1RNAQUANT 78 (H) 04/15/2017   No results found for: HEPBSAB Lab Results  Component Value Date   LABRPR NON-REACTIVE 11/30/2016    CBC Lab Results  Component Value Date   WBC 5.1 11/30/2016   RBC 4.16 (L) 11/30/2016   HGB 13.5 11/30/2016   HCT 40.4 11/30/2016   PLT 116 (L) 11/30/2016   MCV 97.1 11/30/2016   MCH 32.5 11/30/2016   MCHC 33.4 11/30/2016   RDW 10.9 (L) 11/30/2016   LYMPHSABS 1,122 11/30/2016   MONOABS 0.9 04/23/2016   EOSABS 122 11/30/2016    BMET Lab Results  Component Value Date   NA 137 11/30/2016   K 4.4 11/30/2016   CL 101 11/30/2016   CO2 25 11/30/2016   GLUCOSE 105 (H) 11/30/2016   BUN 20 11/30/2016   CREATININE 1.36 (H) 11/30/2016   CALCIUM 9.8 11/30/2016   GFRNONAA 62 11/30/2016   GFRAA 72 11/30/2016      Assessment and Plan  Condyloma = will reach out to [facility] as well as [facility] to see how to get him evaluated  hiv disease = well controlled, continue on current regimen  ckd 2 = stable  Pain management = will refill at #30 but mention to him that this needs to last 2 weeks. Continue with lidocaine cream

## 2017-05-31 ENCOUNTER — Encounter (HOSPITAL_COMMUNITY): Payer: Self-pay | Admitting: Emergency Medicine

## 2017-05-31 ENCOUNTER — Encounter (HOSPITAL_COMMUNITY): Payer: Self-pay

## 2017-05-31 ENCOUNTER — Emergency Department (HOSPITAL_COMMUNITY): Payer: Commercial Managed Care - PPO

## 2017-05-31 ENCOUNTER — Other Ambulatory Visit: Payer: Self-pay

## 2017-05-31 ENCOUNTER — Inpatient Hospital Stay (HOSPITAL_COMMUNITY)
Admission: EM | Admit: 2017-05-31 | Discharge: 2017-06-02 | DRG: 202 | Disposition: A | Discharge status: Home / Self Care | Payer: Commercial Managed Care - PPO | Attending: Family Medicine | Admitting: Family Medicine

## 2017-05-31 DIAGNOSIS — I5042 Chronic combined systolic (congestive) and diastolic (congestive) heart failure: Secondary | ICD-10-CM | POA: Diagnosis not present

## 2017-05-31 DIAGNOSIS — I5030 Unspecified diastolic (congestive) heart failure: Secondary | ICD-10-CM | POA: Diagnosis not present

## 2017-05-31 DIAGNOSIS — J439 Emphysema, unspecified: Secondary | ICD-10-CM | POA: Insufficient documentation

## 2017-05-31 DIAGNOSIS — Z87891 Personal history of nicotine dependence: Secondary | ICD-10-CM

## 2017-05-31 DIAGNOSIS — Z86711 Personal history of pulmonary embolism: Secondary | ICD-10-CM | POA: Diagnosis not present

## 2017-05-31 DIAGNOSIS — Z79899 Other long term (current) drug therapy: Secondary | ICD-10-CM | POA: Diagnosis not present

## 2017-05-31 DIAGNOSIS — R0602 Shortness of breath: Secondary | ICD-10-CM | POA: Diagnosis not present

## 2017-05-31 DIAGNOSIS — Z9114 Patient's other noncompliance with medication regimen: Secondary | ICD-10-CM

## 2017-05-31 DIAGNOSIS — J205 Acute bronchitis due to respiratory syncytial virus: Principal | ICD-10-CM | POA: Diagnosis present

## 2017-05-31 DIAGNOSIS — B20 Human immunodeficiency virus [HIV] disease: Secondary | ICD-10-CM | POA: Diagnosis not present

## 2017-05-31 DIAGNOSIS — J40 Bronchitis, not specified as acute or chronic: Secondary | ICD-10-CM | POA: Diagnosis not present

## 2017-05-31 DIAGNOSIS — E876 Hypokalemia: Secondary | ICD-10-CM | POA: Diagnosis present

## 2017-05-31 DIAGNOSIS — J470 Bronchiectasis with acute lower respiratory infection: Secondary | ICD-10-CM | POA: Diagnosis not present

## 2017-05-31 DIAGNOSIS — A630 Anogenital (venereal) warts: Secondary | ICD-10-CM

## 2017-05-31 DIAGNOSIS — N182 Chronic kidney disease, stage 2 (mild): Secondary | ICD-10-CM | POA: Diagnosis not present

## 2017-05-31 DIAGNOSIS — J210 Acute bronchiolitis due to respiratory syncytial virus: Secondary | ICD-10-CM

## 2017-05-31 DIAGNOSIS — J9809 Other diseases of bronchus, not elsewhere classified: Secondary | ICD-10-CM | POA: Diagnosis not present

## 2017-05-31 DIAGNOSIS — Z8611 Personal history of tuberculosis: Secondary | ICD-10-CM | POA: Diagnosis not present

## 2017-05-31 DIAGNOSIS — J471 Bronchiectasis with (acute) exacerbation: Secondary | ICD-10-CM

## 2017-05-31 DIAGNOSIS — R05 Cough: Secondary | ICD-10-CM | POA: Diagnosis not present

## 2017-05-31 LAB — BASIC METABOLIC PANEL
ANION GAP: 6 (ref 5–15)
BUN: 15 mg/dL (ref 6–20)
CALCIUM: 8.6 mg/dL — AB (ref 8.9–10.3)
CO2: 23 mmol/L (ref 22–32)
Chloride: 104 mmol/L (ref 101–111)
Creatinine, Ser: 1.22 mg/dL (ref 0.61–1.24)
GFR calc Af Amer: >60 mL/min (ref >60)
GFR calc non Af Amer: >60 mL/min (ref >60)
GLUCOSE: 148 mg/dL — AB (ref 65–99)
Potassium: 3.2 mmol/L — ABNORMAL LOW (ref 3.5–5.1)
SODIUM: 133 mmol/L — AB (ref 135–145)

## 2017-05-31 LAB — INFLUENZA PANEL BY PCR (TYPE A & B)
INFLAPCR: NEGATIVE
Influenza B By PCR: NEGATIVE

## 2017-05-31 LAB — RESPIRATORY PANEL BY PCR
ADENOVIRUS-RVPPCR: NOT DETECTED
Bordetella pertussis: NOT DETECTED
CORONAVIRUS 229E-RVPPCR: NOT DETECTED
CORONAVIRUS NL63-RVPPCR: NOT DETECTED
CORONAVIRUS OC43-RVPPCR: NOT DETECTED
Chlamydophila pneumoniae: NOT DETECTED
Coronavirus HKU1: NOT DETECTED
Influenza A: NOT DETECTED
Influenza B: NOT DETECTED
METAPNEUMOVIRUS-RVPPCR: NOT DETECTED
MYCOPLASMA PNEUMONIAE-RVPPCR: NOT DETECTED
PARAINFLUENZA VIRUS 1-RVPPCR: NOT DETECTED
PARAINFLUENZA VIRUS 2-RVPPCR: NOT DETECTED
PARAINFLUENZA VIRUS 4-RVPPCR: NOT DETECTED
Parainfluenza Virus 3: NOT DETECTED
RESPIRATORY SYNCYTIAL VIRUS-RVPPCR: DETECTED — AB
Rhinovirus / Enterovirus: NOT DETECTED

## 2017-05-31 LAB — I-STAT TROPONIN, ED: Troponin i, poc: 0 ng/mL (ref 0.00–0.08)

## 2017-05-31 LAB — CBC
HCT: 34.8 % — ABNORMAL LOW (ref 39.0–52.0)
HEMOGLOBIN: 11.9 g/dL — AB (ref 13.0–17.0)
MCH: 33.1 pg (ref 26.0–34.0)
MCHC: 34.2 g/dL (ref 30.0–36.0)
MCV: 96.7 fL (ref 78.0–100.0)
Platelets: 188 10*3/uL (ref 150–400)
RBC: 3.6 MIL/uL — ABNORMAL LOW (ref 4.22–5.81)
RDW: 12.6 % (ref 11.5–15.5)
WBC: 5 10*3/uL (ref 4.0–10.5)

## 2017-05-31 LAB — BRAIN NATRIURETIC PEPTIDE: B NATRIURETIC PEPTIDE 5: 10.5 pg/mL (ref 0.0–100.0)

## 2017-05-31 MED ORDER — ENOXAPARIN SODIUM 40 MG/0.4ML ~~LOC~~ SOLN
40.0000 mg | SUBCUTANEOUS | Status: DC
  Administered 2017-05-31 – 2017-06-02 (×3): 40 mg via SUBCUTANEOUS
  Filled 2017-05-31 (×3): qty 0.4

## 2017-05-31 MED ORDER — METHYLPREDNISOLONE SODIUM SUCC 125 MG IJ SOLR
125.0000 mg | Freq: Once | INTRAMUSCULAR | Status: AC
  Administered 2017-05-31: 125 mg via INTRAVENOUS
  Filled 2017-05-31: qty 2

## 2017-05-31 MED ORDER — LIDOCAINE 4 % EX CREA
1.0000 "application " | TOPICAL_CREAM | Freq: Three times a day (TID) | CUTANEOUS | Status: DC | PRN
  Filled 2017-05-31: qty 5

## 2017-05-31 MED ORDER — IOPAMIDOL (ISOVUE-370) INJECTION 76%
INTRAVENOUS | Status: AC
  Administered 2017-05-31: 100 mL
  Filled 2017-05-31: qty 100

## 2017-05-31 MED ORDER — IPRATROPIUM-ALBUTEROL 0.5-2.5 (3) MG/3ML IN SOLN
3.0000 mL | RESPIRATORY_TRACT | Status: DC
Start: 2017-05-31 — End: 2017-06-01
  Administered 2017-05-31 (×3): 3 mL via RESPIRATORY_TRACT
  Filled 2017-05-31 (×3): qty 3

## 2017-05-31 MED ORDER — ENSURE ENLIVE PO LIQD
237.0000 mL | Freq: Three times a day (TID) | ORAL | Status: DC
  Administered 2017-05-31 – 2017-06-02 (×6): 237 mL via ORAL
  Filled 2017-05-31: qty 237

## 2017-05-31 MED ORDER — IPRATROPIUM-ALBUTEROL 0.5-2.5 (3) MG/3ML IN SOLN
3.0000 mL | Freq: Once | RESPIRATORY_TRACT | Status: AC
  Administered 2017-05-31: 3 mL via RESPIRATORY_TRACT
  Filled 2017-05-31: qty 3

## 2017-05-31 MED ORDER — SODIUM CHLORIDE 0.9 % IV SOLN: 250.0000 mL | INTRAVENOUS | Status: DC | PRN

## 2017-05-31 MED ORDER — ALBUTEROL SULFATE (2.5 MG/3ML) 0.083% IN NEBU
5.0000 mg | INHALATION_SOLUTION | Freq: Once | RESPIRATORY_TRACT | Status: AC
  Administered 2017-05-31: 5 mg via RESPIRATORY_TRACT
  Filled 2017-05-31: qty 6

## 2017-05-31 MED ORDER — POTASSIUM CHLORIDE CRYS ER 20 MEQ PO TBCR
40.0000 meq | EXTENDED_RELEASE_TABLET | Freq: Once | ORAL | Status: AC
  Administered 2017-05-31: 40 meq via ORAL
  Filled 2017-05-31: qty 2

## 2017-05-31 MED ORDER — ACETAMINOPHEN 325 MG PO TABS: 650.0000 mg | ORAL_TABLET | Freq: Four times a day (QID) | ORAL | Status: DC | PRN

## 2017-05-31 MED ORDER — MUPIROCIN CALCIUM 2 % EX CREA
1.0000 "application " | TOPICAL_CREAM | Freq: Two times a day (BID) | CUTANEOUS | Status: DC
  Administered 2017-05-31 – 2017-06-02 (×5): 1 via TOPICAL
  Filled 2017-05-31 (×2): qty 15

## 2017-05-31 MED ORDER — DOLUTEGRAVIR SODIUM 50 MG PO TABS
50.0000 mg | ORAL_TABLET | Freq: Every day | ORAL | Status: DC
  Administered 2017-06-01 – 2017-06-02 (×2): 50 mg via ORAL
  Filled 2017-05-31 (×3): qty 1

## 2017-05-31 MED ORDER — ONDANSETRON HCL 4 MG PO TABS: 4.0000 mg | ORAL_TABLET | Freq: Four times a day (QID) | ORAL | Status: DC | PRN

## 2017-05-31 MED ORDER — POLYETHYLENE GLYCOL 3350 17 G PO PACK: 17.0000 g | PACK | Freq: Every day | ORAL | Status: DC | PRN

## 2017-05-31 MED ORDER — SULFAMETHOXAZOLE-TRIMETHOPRIM 800-160 MG PO TABS
1.0000 | ORAL_TABLET | Freq: Every day | ORAL | Status: DC
  Administered 2017-05-31 – 2017-06-02 (×3): 1 via ORAL
  Filled 2017-05-31 (×3): qty 1

## 2017-05-31 MED ORDER — ACETAMINOPHEN 650 MG RE SUPP: 650.0000 mg | Freq: Four times a day (QID) | RECTAL | Status: DC | PRN

## 2017-05-31 MED ORDER — SODIUM CHLORIDE 0.9% FLUSH
3.0000 mL | Freq: Two times a day (BID) | INTRAVENOUS | Status: DC
  Administered 2017-05-31 – 2017-06-02 (×5): 3 mL via INTRAVENOUS

## 2017-05-31 MED ORDER — PREDNISONE 20 MG PO TABS
50.0000 mg | ORAL_TABLET | Freq: Every day | ORAL | Status: DC
  Administered 2017-06-01 – 2017-06-02 (×2): 50 mg via ORAL
  Filled 2017-05-31 (×2): qty 3

## 2017-05-31 MED ORDER — SODIUM CHLORIDE 0.9% FLUSH: 3.0000 mL | INTRAVENOUS | Status: DC | PRN

## 2017-05-31 MED ORDER — ONDANSETRON HCL 4 MG/2ML IJ SOLN: 4.0000 mg | Freq: Four times a day (QID) | INTRAMUSCULAR | Status: DC | PRN

## 2017-05-31 MED ORDER — DARUNAVIR-COBICISTAT 800-150 MG PO TABS
1.0000 | ORAL_TABLET | Freq: Every day | ORAL | Status: DC
Start: 2017-05-31 — End: 2017-06-02
  Administered 2017-06-01 – 2017-06-02 (×2): 1 via ORAL
  Filled 2017-05-31 (×3): qty 1

## 2017-05-31 MED ORDER — IMIQUIMOD 5 % EX CREA: TOPICAL_CREAM | CUTANEOUS | Status: DC

## 2017-05-31 NOTE — ED Triage Notes (Signed)
Pt present by [facility]. EMS reports the pt has been sick for 2 weeks with a dry cough. EMS reports history of pneumonia in the past. EMS reports pt was satting at 93% on RA. EMS administered 10mg  of albuterol, 0.5 atrovent, and 125mg  of solumedrol. EMS reports 12 lead unremarkable and sinus rhythm on the monitor. Vitals as noted. Capnography was 24-30 with resps initially 40. EMS reports wheezes in the uppers and rhonchi in the lowers.   Pt reports SOB for the past 2 days. Pt denies any pain. Pt speaks primarily french.

## 2017-05-31 NOTE — Consult Note (Addendum)
Name: [name] MRN: [id number] DOB: 12/01/1970    ADMISSION DATE:  05/31/2017 CONSULTATION DATE:  3/18  REFERRING MD :  [facility]PTS   CHIEF COMPLAINT:  Abnormal CT   BRIEF PATIENT DESCRIPTION: 47 year old male with history of HIV with AIDS (noncompliant with antiretroviral therapy), CHF, CKD 2 presented 3/18 with 2-week history of shortness of breath and nonproductive cough.  No fever, hemodynamically stable, CTA chest negative for PE but did show a left hilar soft tissue density causing narrowing of the lingula and left lower lobe bronchus.  PCCM consulted for further workup.   SIGNIFICANT EVENTS    STUDIES:  CTA chest 3/18>>> 1. No acute pulmonary embolus. Previous small right upper lobe pulmonary embolus has resolved. 2. Chronic lung disease with multifocal scarring, emphysema and left lung bronchiectasis. There is increased bronchial filling in the left lower lobe from prior exam. 3. Increased left hilar soft tissue density is nonspecific, however may represent reactive adenopathy. This causes luminal narrowing of the lingular and left lower lobe bronchus. There is short segment near complete left lower lobe bronchial occlusion due to soft tissue density. Bronchoscopic evaluation may be helpful for further evaluation. 4. Enlarged right hilar lymph node, chronic and slightly decreased from prior exam, presumably reactive.   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  No fever, hemodynamically stable, CTA chest negative for PE but did show a left hilar soft tissue density causing narrowing of the lingula and left lower lobe bronchus.  PCCM consulted for further workup.   Patient is former smoker but has not smoked in the last 15 years. Denies chest pain, hemoptysis, night sweats, fever, leg or calf pain,  recent travel.  PAST MEDICAL HISTORY :   has a past medical history of Depression, Genital warts (01/04/2017), Hepatitis, HIV disease ([name]), and TB (pulmonary tuberculosis).  has a past surgical history that includes IM nailing tibia (Right, 03/21/2014); ORIF ankle fracture (Right, 03/21/2014); Fracture surgery; Tibia IM nail insertion (Right, 03/21/2014); ORIF ankle fracture (Right, 03/21/2014); and Video bronchoscopy (Bilateral, 03/02/2016). Prior to Admission medications   Medication Sig Start Date End Date Taking? Authorizing Provider  BACLOFEN PO Take 1 tablet by mouth daily as needed (pain).   Yes [provider]  darunavir-cobicistat (PREZCOBIX) 800-150 MG tablet Take 1 tablet by mouth daily with breakfast. 02/17/17  Yes Kuppelweiser, Cassie L, RPH-CPP  dolutegravir (TIVICAY) 50 MG tablet Take 1 tablet (50 mg total) by mouth daily. 02/17/17  Yes Kuppelweiser, Cassie L, RPH-CPP  feeding supplement (BOOST / RESOURCE BREEZE) LIQD Take 1 Container by mouth 3 (three) times daily between meals. 04/24/16  Yes Leroy SeaSingh, Prashant K, MD  HYDROcodone-acetaminophen (NORCO/VICODIN) 5-325 MG tablet Take 1 tablet by mouth every 12 (twelve) hours as needed for severe pain. 05/17/17  Yes Judyann MunsonSnider, Cynthia, MD  ibuprofen (ADVIL,MOTRIN) 200 MG tablet Take 200 mg by mouth every 6 (six) hours as needed for moderate pain.   Yes [provider]  imiquimod (ALDARA) 5 % cream APPLY TOPICALLY 3 (THREE) TIMES A WEEK. 01/20/17  Yes Judyann MunsonSnider, Cynthia, MD  lidocaine (LMX) 4 % cream Apply 1 application topically 3 (three) times daily as needed. Apply to genital sores for pain relief as needed 05/11/17  Yes Calone, Tama HeadingsGregory D, FNP  ondansetron (ZOFRAN) 4 MG tablet Take 1 tablet (4 mg total) by mouth every 8 (eight) hours as needed.  kichefuchefu (nausea in swahili) 12/03/16  Yes Judyann Munson, MD  sulfamethoxazole-trimethoprim (BACTRIM DS,SEPTRA DS) 800-160 MG tablet TAKE 1 TABLET BY MOUTH ONCE DAILY 10/13/16  Yes Judyann Munson,  MD  traMADol (ULTRAM) 50 MG tablet Take 50 mg by mouth every 4 (four) hours as needed for pain. 04/22/17  Yes [provider]  guaiFENesin (ROBITUSSIN) 100 MG/5ML SOLN Take 5 mLs (100 mg total) by mouth every 4 (four) hours as needed for cough or to loosen phlegm. Patient not taking: Reported on 05/04/2017 02/17/17   Judyann Munson, MD  mupirocin cream (BACTROBAN) 2 % Apply 1 application topically 2 (two) times daily. Apply to open/sore areas to help with pain and prevent infection 03/07/17   Shaune Pollack, MD   No Known Allergies  FAMILY HISTORY:  family history includes Heart disease in his sister; Hypertension in his other. SOCIAL HISTORY:  reports that he quit smoking about 16 years ago. His smoking use included cigarettes. He has a 3.00 pack-year smoking history. he has never used smokeless tobacco. He reports that he drinks alcohol. He reports that he does not use drugs.  REVIEW OF SYSTEMS:   As per HPI - All other systems reviewed and were neg.   SUBJECTIVE:   VITAL SIGNS: Temp:  [97.8 F (36.6 C)-98.1 F (36.7 C)] 98.1 F (36.7 C) (03/18 0313) Pulse Rate:  [87-99] 98 (03/18 1100) Resp:  [13-25] 18 (03/18 1100) BP: (107-141)/(68-90) 131/77 (03/18 1100) SpO2:  [89 %-100 %] 94 % (03/18 1045) Weight:  [79.8 kg (176 lb)] 79.8 kg (176 lb) (03/18 0113)  PHYSICAL EXAMINATION: General:  Pleasant thin adult male, NAD  Neuro:  Awake, alert, appropriate, MAE, english is marginal (swahili)  HEENT:  Mm moist, no JVD  Cardiovascular:  s1s2 rrr Lungs:  resps even non labored on RA, coarse  Abdomen:  Soft, +bs  Musculoskeletal:  Warm and dry, no edema   Recent Labs  Lab 05/31/17 0122  NA 133*  K 3.2*  CL 104  CO2 23  BUN 15  CREATININE 1.22  GLUCOSE 148*   Recent Labs  Lab 05/31/17 0122  HGB 11.9*  HCT 34.8*  WBC 5.0  PLT 188   Dg Chest 1 View  Result Date: 05/31/2017 CLINICAL DATA:  Dyspnea. Patient has been sick 2 weeks with dry cough. EXAM: CHEST  1 VIEW  COMPARISON:  04/23/2016 FINDINGS: Chronic subpleural scarring along the periphery of the left mid lung and left lung base with emphysematous hyperinflation both lungs, upper lobe predominant. No overt pulmonary edema, effusion or pneumothorax. No pneumonic consolidations. Tortuous atherosclerotic aorta with top-normal size heart. No acute osseous abnormality. IMPRESSION: Emphysematous hyperinflation of the lungs, upper lobe predominant. Subpleural scarring along the periphery of the left mid lung and left base. No acute pulmonary consolidation or CHF. Electronically Signed   By: Tollie Eth M.D.   On: 05/31/2017 02:36   Ct Angio Chest Pe W And/or Wo Contrast  Result Date: 05/31/2017 CLINICAL DATA:  Shortness of breath. Cough for 2 weeks. Shortness of breath for 2 days. History of pulmonary embolus. EXAM: CT ANGIOGRAPHY CHEST WITH CONTRAST TECHNIQUE: Multidetector CT imaging of the chest was performed using the standard protocol during bolus administration of intravenous contrast. Multiplanar CT image reconstructions and MIPs were obtained to evaluate the vascular anatomy. CONTRAST:  69 cc ISOVUE-370 IOPAMIDOL (ISOVUE-370) INJECTION 76% COMPARISON:  Chest radiograph earlier this day.  Chest CT 02/24/2016 FINDINGS: Cardiovascular: Previously seen right upper lobe pulmonary artery filling defect has resolved. No new filling  defects in the pulmonary arteries to suggest acute pulmonary embolus. Thoracic aorta is normal in caliber. Heart is normal in size. No pericardial effusion. Mediastinum/Nodes: There is ill-defined left hilar soft tissue density tracking along the lingular and to a lesser extent lower lobe bronchus. Enlarged right hilar node measuring 14 mm is chronic and slightly diminished from prior exam. Small hiatal hernia with slightly patulous esophagus. No thyroid nodule. Lungs/Pleura: Apical predominant emphysema. Right apical and anterior left upper lobe scarring. Left hilar soft tissue density causes  narrowing of the lingular bronchus. This also causes narrowing of the left lower lobe bronchus near short-segment bronchial occlusion for example image 76 series 7. Chronic left lower lobe bronchiectasis with increased bronchial filling from prior exam. Minimal lingular and left upper lobe bronchiectasis appears similar. Minimal right lower lobe atelectasis. No pulmonary edema. No pleural effusion. Upper Abdomen: No acute finding. Musculoskeletal: There are no acute or suspicious osseous abnormalities. Review of the MIP images confirms the above findings. IMPRESSION: 1. No acute pulmonary embolus. Previous small right upper lobe pulmonary embolus has resolved. 2. Chronic lung disease with multifocal scarring, emphysema and left lung bronchiectasis. There is increased bronchial filling in the left lower lobe from prior exam. 3. Increased left hilar soft tissue density is nonspecific, however may represent reactive adenopathy. This causes luminal narrowing of the lingular and left lower lobe bronchus. There is short segment near complete left lower lobe bronchial occlusion due to soft tissue density. Bronchoscopic evaluation may be helpful for further evaluation. 4. Enlarged right hilar lymph node, chronic and slightly decreased from prior exam, presumably reactive. Emphysema (ICD10-J43.9). Electronically Signed   By: Rubye Oaks M.D.   On: 05/31/2017 05:03    ASSESSMENT / PLAN:  Abnormal CT chest-left hilar soft tissue density with narrowing of the lingula and left lower lobe bronchus in the setting of probable acute bronchitis +/- COPD exacerbation (unclear if he has true COPD).  Obstruction on CT scan most consistent with mucous plugging.  Patient is afebrile, not hypoxic.  Plan- Aggressive pulmonary hygiene with flutter valve, incentive spirometer, chest vest Sputum culture No role for bronchoscopy at this time Continue steroids Flu, respiratory viral panel, urine strep and Legionella all  pending Follow-up chest x-ray Supplemental O2 if needed Bronchodilators  q6hours Continue home bactrim    Dirk Dress, NP 05/31/2017  11:46 AM Pager: [phone] or [phone]  Attending Note:  47 year old male with HIV that is poorly compliant with anti-retrovirals who presents to the hospital with SOB.  CT was done that showed a potential cut off on the LLL with underlying bronchiectatic changes noted that I reviewed myself and discussed with the resident.  On exam, crackles on the LLL noted with an otherwise quite chest.   Mucous plugging: can not rule out a mass at this point but will be difficult to ascertain.  - Vibravest  - Flutter valve  - Aggressive pulmonary toilet  - Repeat CXR in AM for comparison  - No role for bronchoscopy in the acute phase  Bronchiectasis exacerbation  - Continue bactrim  - F/U on culture  - Steroids as ordered  - Bronchodilators as ordered  Bronchitis  - Bactrim  - RVP  - Urine strep and legionella  - F/U on cultures  PCCM will f/u on AM   Patient seen and examined, agree with above note.  I dictated the care and orders written for this patient under my direction.  Alyson Reedy, MD [phone]

## 2017-05-31 NOTE — ED Provider Notes (Signed)
TIME SEEN: 1:33 AM  CHIEF COMPLAINT: Shortness of breath  HPI: Patient is a 47 year old male with previous history of pulmonary tuberculosis, HIV on medications, genital warts who presents to the emergency department with shortness of breath.  Patient reports symptoms started yesterday.  Has had a dry cough.  No fever.  No chest pain or chest discomfort.  No history of tobacco use, asthma or COPD.  Does not wear home oxygen.  It does appear he had a PE in 2017.  Denies being on anticoagulation.  He is not sure if this feels similar.  No recent travel.  [name] interpreter used throughout visit.  ROS: See HPI Constitutional: no fever  Eyes: no drainage  ENT: no runny nose   Cardiovascular:  no chest pain  Resp:  SOB  GI: no vomiting GU: no dysuria Integumentary: no rash  Allergy: no hives  Musculoskeletal: no leg swelling  Neurological: no slurred speech ROS otherwise negative  PAST MEDICAL HISTORY/PAST SURGICAL HISTORY:  Past Medical History:  Diagnosis Date  . Depression    "stress and depression for any man is common" (03/21/2014)  . Genital warts 01/04/2017  . Hepatitis    "I don't know what hepatitis I have"  . HIV disease ([name])   . TB (pulmonary tuberculosis)    previously treated according to refugee documentation    MEDICATIONS:  Prior to Admission medications   Medication Sig Start Date End Date Taking? Authorizing Provider  darunavir-cobicistat (PREZCOBIX) 800-150 MG tablet Take 1 tablet by mouth daily with breakfast. 02/17/17   Kuppelweiser, Cassie L, RPH-CPP  dolutegravir (TIVICAY) 50 MG tablet Take 1 tablet (50 mg total) by mouth daily. 02/17/17   Kuppelweiser, Cassie L, RPH-CPP  feeding supplement (BOOST / RESOURCE BREEZE) LIQD Take 1 Container by mouth 3 (three) times daily between meals. 04/24/16   Leroy Sea, MD  guaiFENesin (ROBITUSSIN) 100 MG/5ML SOLN Take 5 mLs (100 mg total) by mouth every 4 (four) hours as needed for cough or to loosen phlegm. Patient  not taking: Reported on 05/04/2017 02/17/17   Judyann Munson, MD  HYDROcodone-acetaminophen (NORCO/VICODIN) 5-325 MG tablet Take 1 tablet by mouth every 12 (twelve) hours as needed for severe pain. 05/17/17   Judyann Munson, MD  imiquimod Mathis Dad) 5 % cream APPLY TOPICALLY 3 (THREE) TIMES A WEEK. 01/20/17   Judyann Munson, MD  lidocaine (LMX) 4 % cream Apply 1 application topically 3 (three) times daily as needed. Apply to genital sores for pain relief as needed 05/11/17   Veryl Speak, FNP  mupirocin cream (BACTROBAN) 2 % Apply 1 application topically 2 (two) times daily. Apply to open/sore areas to help with pain and prevent infection 03/07/17   Shaune Pollack, MD  ondansetron (ZOFRAN) 4 MG tablet Take 1 tablet (4 mg total) by mouth every 8 (eight) hours as needed. kichefuchefu (nausea in swahili) 12/03/16   Judyann Munson, MD  sulfamethoxazole-trimethoprim (BACTRIM DS,SEPTRA DS) 800-160 MG tablet TAKE 1 TABLET BY MOUTH ONCE DAILY Patient not taking: Reported on 05/04/2017 10/13/16   Judyann Munson, MD    ALLERGIES:  No Known Allergies  SOCIAL HISTORY:  Social History   Tobacco Use  . Smoking status: Former Smoker    Packs/day: 0.50    Years: 6.00    Pack years: 3.00    Types: Cigarettes    Last attempt to quit: 03/16/2001    Years since quitting: 16.2  . Smokeless tobacco: Never Used  . Tobacco comment: "quit smoking ~ 2003"  Substance Use Topics  .  Alcohol use: Yes    Comment: drinks bottled beer intermittently    FAMILY HISTORY: Family History  Problem Relation Age of Onset  . Hypertension Other   . Heart disease Sister     EXAM: BP (!) 141/82   Pulse 89   Temp 97.8 F (36.6 C) (Oral)   Resp 20   Ht 6' (1.829 m)   Wt 79.8 kg (176 lb)   SpO2 97%   BMI 23.87 kg/m  CONSTITUTIONAL: Alert and oriented and responds appropriately to questions. Well-appearing; well-nourished HEAD: Normocephalic EYES: Conjunctivae clear, pupils appear equal, EOMI ENT: normal nose;  moist mucous membranes NECK: Supple, no meningismus, no nuchal rigidity, no LAD  CARD: RRR; S1 and S2 appreciated; no murmurs, no clicks, no rubs, no gallops RESP: Patient is tachypneic.  He is not hypoxic.  His diffuse rhonchorous breath sounds without wheezing or rales.  Speaking full sentences. ABD/GI: Normal bowel sounds; non-distended; soft, non-tender, no rebound, no guarding, no peritoneal signs, no hepatosplenomegaly GU: Diffuse genital warts in the groin and perineum that do not involve the shaft of the penis without sign of superimposed infection, no penile discharge, no testicular pain or swelling BACK:  The back appears normal and is non-tender to palpation, there is no CVA tenderness EXT: Normal ROM in all joints; non-tender to palpation; no edema; normal capillary refill; no cyanosis, no calf tenderness or swelling    SKIN: Normal color for age and race; warm; no rash NEURO: Moves all extremities equally PSYCH: The patient's mood and manner are appropriate. Grooming and personal hygiene are appropriate.  MEDICAL DECISION MAKING: Patient here with complaints of shortness of breath.  He does have very rhonchorous breath sounds on examination.  Receiving albuterol treatment with some relief.  Concern for possible infectious etiology especially given patient is HIV positive versus CHF versus PE.  Doubt ACS.  Will obtain labs, check rectal temperature, flu swab, chest x-ray.  We will continue breathing treatments.  It appears his last CD4 count in January 2019 was 100 with a viral load of 78.  He reports compliance with his medications.  ED PROGRESS: Patient's labs are unremarkable.  Negative troponin.  Chest x-ray shows emphysematous changes but no consolidation or sign of CHF.  Plan is to obtain CT of his chest for further evaluation.  Will continue breathing treatments and add on IV steroids given these emphysematous changes seen on chest x-ray.  CT scan shows no pulmonary embolus.  His  previous right upper lobe PE has resolved.  CT shows chronic lung disease with multifocal scarring, emphysema and bronchiectasis.  There is also a left hilar soft tissue density is nonspecific that may represent reactive adenopathy.  This soft tissue density causes narrowing of the lingular and left lower lobe bronchus with almost complete bronchial occlusion of the left lower lobe bronchus.  Patient likely would benefit from bronchoscopy.  He reports feeling better but still has shortness of breath, productive cough and some scattered wheezing now.  Will give second [name] treatment.  Will discuss with medicine for admission.  I feel pulmonology should be consulted not emergently for this patient.  No infectious etiology present at this time.  When patient is talking to me his oxygen saturation does drop to 89% on room air at rest.  6:08 AM Discussed patient's case with FM resident, Dr. [facility] Chimes.  I have recommended admission and patient (and family if present) agree with this plan. Admitting physician will place admission orders.   I reviewed all  nursing notes, vitals, pertinent previous records, EKGs, lab and urine results, imaging (as available).     EKG Interpretation  Date/Time:  Monday May 31 2017 01:11:06 EDT Ventricular Rate:  86 PR Interval:    QRS Duration: 98 QT Interval:  384 QTC Calculation: 460 R Axis:   6 Text Interpretation:  Sinus rhythm Anteroseptal infarct, age indeterminate ST elevation, consider inferior injury No old tracing to compare Confirmed by Consuello Lassalle, Baxter HireKristen [phone]([id number]) on 05/31/2017 1:19:06 AM         Levetta Bognar, Layla MawKristen N, DO 05/31/17 [id number]

## 2017-05-31 NOTE — ED Notes (Signed)
Entered room to provide medications to patient, patient reports he took his own medications this morning. Advised patient not to take personal medications, that we will provide them for him. Voices understanding.

## 2017-05-31 NOTE — H&P (Addendum)
Family Medicine Teaching [facility] Admission History and Physical Service Pager: [phone]  Patient name: [name] Medical record number: [id number] Date of birth: May 13, 1970 Age: 47 y.o. Gender: male  Primary Care Provider: Tobey Grim, MD Consultants: None Code Status: Full (per discussion on admission)  Chief Complaint: shortness of breath and cough  Assessment and Plan: [name] is a 47 y.o. male presenting with shortness of breath and nonproductive cough x 2 weeks. PMH is significant for HIV with AIDS, HFpEF, CKD II, genital warts.   Dyspnea, cough, intermittent hypoxia: Reassuringly afebrile and no signs of sepsis with a normotensive blood pressure and normal heart rate. Normal respiratory rate. Intermittently hypoxic to the high 80s when talking, otherwise O2 in the 90s on room air.  Exam showing diffuse rhonchorous breath sounds and faint wheezing, decreased lung sounds at the bases.  Lab work showing no leukocytosis (WBC of 5 however can be low in AIDS). CTA negative for PE however does show emphysematous changes and a left hilar soft tissue density causing narrowing of the lingular and left lower lobe bronchus.  Less likely CHF exacerbation given low BNP of 10.5 and no signs of fluid overload.  Leading differentials include acute bronchitis vs COPD exacerbation (history of tobacco abuse 15 years ago) vs bronchus obstruction (as seen on CTA).  -Admit to family medicine teaching service, MedSurg, attending Dr. Pollie Meyer -Vital signs per floor protocol -Continuous pulse ox -O2 supplementation as needed - Duo nebs every 4 hours - S/p Solu-Medrol today, prednisone 50 mg daily starting tomorrow for 5-day course -Influenza panel, respiratory viral panel, legionella, sputum culture  -Blood cultures -Continue home Bactrim -Discussed CTA with pulmonologist Dr. Molli Knock who reviewed CTA over the phone. Obstruction was consistent with phlegm, suggested pulmonary  toilet with aggressive assistance from RT as well as sputum studies. No bronchoscopy recommended at this time  HIV with AIDS: HIV viral load 78 and CD4 count 100 on 04/15/17.  Follows with Dr. Drue Second. Has had TB and PCP in the past. Questionable antiretroviral compliance, patient notes that he is compliant however given his last CD4 and viral load is likely not. -Continue home PREZCOBIX and TIVICAY -Continue home Bactrim  HFpEF: Has hx of this however his last echocardiogram in December 2017 showing EF of 45-50%, no diastolic dysfunction.  No signs of fluid overload on exam or on imaging -Not on any beta blocker or ACE or diuretic  ?EKG changes: No chest pain. Troponin 0.  EKG reading as ST elevation however do not see this, there are T wave inversions on the V1 and aVR, no prior EKG to compare. -Repeat EKG  Condylomas affected bilateral groin/scrotal: Has been following with ID for this.  Was referred to general surgery however there are concerns given his low CD4 count.  He was referred to [facility] but has not heard back from them yet -Continue home lidocaine cream -Continue home imiquimod and Bactroban ointment  Hypokalemia: K 3.2 -BMP tomorrow -40 of K-Dur  FEN/GI: SLIV, regular diet Prophylaxis: Lovenox  Disposition: Admit to med surg, attending Dr. Pollie Meyer  History of Present Illness:  [name] is a 47 y.o. male presenting with redness of breath and cough for the last 2 weeks.   * Telephone [name] interpreter used for this visit *  Patient notes that over the last 2 weeks he has had progressive shortness of breath and nonproductive cough, no worse with ambulation and no orthopnea.  He denies any fevers, chills, runny nose, nasal  congestion, sore throat.  He notes that he has not felt ill and has been eating and drinking normally.  He also denies any nausea, vomiting, diarrhea, constipation, abdominal pain, chest pain.  He has not taken any medication besides Robitussin for  the cough.  He denies any lightheadedness or feelings like he is going to pass out.  He admits to history of pulmonary embolism however he is not on any anticoagulation at this time.  He follows with Dr. Drue Second who is/ his infectious disease doctor and he notes compliance with all of his medications.   Patient notes that he lives alone and works at the [employer].  He is independent with all his ADLs and a IADLs.  He notes that his wife is back in [place],  8 months ago she said she is going to see him in 8 weeks. He does not know if she is going to come live with him.  EMS course: Patient was noted to be 93% on room air and given 2 mg albuterol, 0.5 mg Atrovent and 125 mg of Solu-Medrol.  EKG was normal.  Noted to have wheezing and rhonchorous breath sounds.  ED course: Patient was given 2 rounds of [name]'s and 1 albuterol nebulizer.  IV Solu-Medrol administered.  O2 dipped down into the high 80s when talking but otherwise stayed in the 90s on room air.  No supplemental oxygen was needed.  All vital signs were stable.   Review Of Systems: Per HPI with the following additions: see HPI  ROS  Patient Active Problem List   Diagnosis Date Noted  . Shortness of breath 05/31/2017  . Condylomata acuminata in male 01/04/2017  . 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 Chronic systolic CHF (congestive heart failure) ([name]) 04/24/2016  . AKI (acute kidney injury) ([name]) 04/24/2016  . Flu 04/24/2016  . 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 Hypoxia   . Acute respiratory failure with hypoxia ([name]) 02/25/2016  . Normochromic normocytic anemia 02/25/2016  . Pneumonia 02/25/2016  . Protein-calorie malnutrition, severe 02/25/2016  . PE (pulmonary thromboembolism) ([name]) 02/24/2016  . Renal insufficiency   . Surgery, elective   . AIDS ([name])   . S/P ORIF (open reduction internal fixation) fracture 03/21/2014  . Low back pain 10/10/2013  . Multifocal  pneumonia 09/27/2013  . HIV disease ([name]) 09/18/2013  . Refugee health examination 09/18/2013  . Cough 09/18/2013    Past Medical History: Past Medical History:  Diagnosis Date  . Depression    "stress and depression for any man is common" (03/21/2014)  . Genital warts 01/04/2017  . Hepatitis    "I don't know what hepatitis I have"  . HIV disease ([name])   . TB (pulmonary tuberculosis)    previously treated according to refugee documentation    Past Surgical History: Past Surgical History:  Procedure Laterality Date  . FRACTURE SURGERY    . IM NAILING TIBIA Right 03/21/2014  . ORIF ANKLE FRACTURE Right 03/21/2014   lateral malleolus/notes 03/21/2014  . ORIF ANKLE FRACTURE Right 03/21/2014   Procedure: OPEN REDUCTION INTERNAL FIXATION (ORIF) pilon ;  Surgeon: Eldred Manges, MD;  Location: [facility] OR;  Service: Orthopedics;  Laterality: Right;  . TIBIA IM NAIL INSERTION Right 03/21/2014   Procedure: INTRAMEDULLARY (IM) NAIL TIBIAL;  Surgeon: Eldred Manges, MD;  Location: [facility] OR;  Service: Orthopedics;  Laterality: Right;  Marland Kitchen VIDEO BRONCHOSCOPY Bilateral 03/02/2016   Procedure: VIDEO BRONCHOSCOPY WITHOUT FLUORO;  Surgeon:  Roslynn Amble, MD;  Location: [facility] ENDOSCOPY;  Service: Cardiopulmonary;  Laterality: Bilateral;    Social History: Social History   Tobacco Use  . Smoking status: Former Smoker    Packs/day: 0.50    Years: 6.00    Pack years: 3.00    Types: Cigarettes    Last attempt to quit: 03/16/2001    Years since quitting: 16.2  . Smokeless tobacco: Never Used  . Tobacco comment: "quit smoking ~ 2003"  Substance Use Topics  . Alcohol use: Yes    Comment: drinks bottled beer intermittently  . Drug use: No     Family History: Family History  Problem Relation Age of Onset  . Hypertension Other   . Heart disease Sister     Allergies and Medications: No Known Allergies No current facility-administered medications on file prior to encounter.    Current Outpatient Medications  on File Prior to Encounter  Medication Sig Dispense Refill  . BACLOFEN PO Take 1 tablet by mouth daily as needed (pain).    Marland Kitchen darunavir-cobicistat (PREZCOBIX) 800-150 MG tablet Take 1 tablet by mouth daily with breakfast. 30 tablet 5  . dolutegravir (TIVICAY) 50 MG tablet Take 1 tablet (50 mg total) by mouth daily. 30 tablet 5  . feeding supplement (BOOST / RESOURCE BREEZE) LIQD Take 1 Container by mouth 3 (three) times daily between meals. 90 Container 0  . HYDROcodone-acetaminophen (NORCO/VICODIN) 5-325 MG tablet Take 1 tablet by mouth every 12 (twelve) hours as needed for severe pain. 30 tablet 0  . ibuprofen (ADVIL,MOTRIN) 200 MG tablet Take 200 mg by mouth every 6 (six) hours as needed for moderate pain.    Marland Kitchen imiquimod (ALDARA) 5 % cream APPLY TOPICALLY 3 (THREE) TIMES A WEEK. 12 each 0  . lidocaine (LMX) 4 % cream Apply 1 application topically 3 (three) times daily as needed. Apply to genital sores for pain relief as needed 120 g 0  . ondansetron (ZOFRAN) 4 MG tablet Take 1 tablet (4 mg total) by mouth every 8 (eight) hours as needed. kichefuchefu (nausea in swahili) 20 tablet 3  . sulfamethoxazole-trimethoprim (BACTRIM DS,SEPTRA DS) 800-160 MG tablet TAKE 1 TABLET BY MOUTH ONCE DAILY 30 tablet 2  . traMADol (ULTRAM) 50 MG tablet Take 50 mg by mouth every 4 (four) hours as needed for pain.  0  . guaiFENesin (ROBITUSSIN) 100 MG/5ML SOLN Take 5 mLs (100 mg total) by mouth every 4 (four) hours as needed for cough or to loosen phlegm. (Patient not taking: Reported on 05/04/2017) 1200 mL 0  . mupirocin cream (BACTROBAN) 2 % Apply 1 application topically 2 (two) times daily. Apply to open/sore areas to help with pain and prevent infection 30 g 0    Objective: BP 109/69   Pulse 92   Temp 98.1 F (36.7 C) (Rectal)   Resp 17   Ht 6' (1.829 m)   Wt 176 lb (79.8 kg)   SpO2 94%   BMI 23.87 kg/m  Exam: General: In no acute distress, sitting up in bed alert Eyes: Pupils equal and reactive to  light, nonicteric sclera ENTM: Moist mucous membranes, no oral lesions or pharyngeal erythema Neck: No JVD Cardiovascular: Regular rate and rhythm, normal S1-S2, no murmurs appreciated, no edema, 2+ DP pulses bilaterally Respiratory: Normal work of breathing, rhonchorous lung sounds heard bilaterally throughout, faint wheezing throughout and decreased breath sounds in bilateral lung bases Gastrointestinal: Soft, nondistended, nontender, normal bowel sounds MSK: Full range of motion of all extremities  Derm: no  rashes Neuro: Alert and oriented x3, cranial nerves II through XII intact, 5 out of 5 strength throughout, sensation grossly intact throughout Psych: Normal mood and affect  Labs and Imaging: CBC BMET  Recent Labs  Lab 05/31/17 0122  WBC 5.0  HGB 11.9*  HCT 34.8*  PLT 188   Recent Labs  Lab 05/31/17 0122  NA 133*  K 3.2*  CL 104  CO2 23  BUN 15  CREATININE 1.22  GLUCOSE 148*  CALCIUM 8.6*     Dg Chest 1 View  Result Date: 05/31/2017 CLINICAL DATA:  Dyspnea. Patient has been sick 2 weeks with dry cough. EXAM: CHEST  1 VIEW COMPARISON:  04/23/2016 FINDINGS: Chronic subpleural scarring along the periphery of the left mid lung and left lung base with emphysematous hyperinflation both lungs, upper lobe predominant. No overt pulmonary edema, effusion or pneumothorax. No pneumonic consolidations. Tortuous atherosclerotic aorta with top-normal size heart. No acute osseous abnormality. IMPRESSION: Emphysematous hyperinflation of the lungs, upper lobe predominant. Subpleural scarring along the periphery of the left mid lung and left base. No acute pulmonary consolidation or CHF. Electronically Signed   By: Tollie Eth M.D.   On: 05/31/2017 02:36   Ct Angio Chest Pe W And/or Wo Contrast  Result Date: 05/31/2017 CLINICAL DATA:  Shortness of breath. Cough for 2 weeks. Shortness of breath for 2 days. History of pulmonary embolus. EXAM: CT ANGIOGRAPHY CHEST WITH CONTRAST TECHNIQUE:  Multidetector CT imaging of the chest was performed using the standard protocol during bolus administration of intravenous contrast. Multiplanar CT image reconstructions and MIPs were obtained to evaluate the vascular anatomy. CONTRAST:  69 cc ISOVUE-370 IOPAMIDOL (ISOVUE-370) INJECTION 76% COMPARISON:  Chest radiograph earlier this day.  Chest CT 02/24/2016 FINDINGS: Cardiovascular: Previously seen right upper lobe pulmonary artery filling defect has resolved. No new filling defects in the pulmonary arteries to suggest acute pulmonary embolus. Thoracic aorta is normal in caliber. Heart is normal in size. No pericardial effusion. Mediastinum/Nodes: There is ill-defined left hilar soft tissue density tracking along the lingular and to a lesser extent lower lobe bronchus. Enlarged right hilar node measuring 14 mm is chronic and slightly diminished from prior exam. Small hiatal hernia with slightly patulous esophagus. No thyroid nodule. Lungs/Pleura: Apical predominant emphysema. Right apical and anterior left upper lobe scarring. Left hilar soft tissue density causes narrowing of the lingular bronchus. This also causes narrowing of the left lower lobe bronchus near short-segment bronchial occlusion for example image 76 series 7. Chronic left lower lobe bronchiectasis with increased bronchial filling from prior exam. Minimal lingular and left upper lobe bronchiectasis appears similar. Minimal right lower lobe atelectasis. No pulmonary edema. No pleural effusion. Upper Abdomen: No acute finding. Musculoskeletal: There are no acute or suspicious osseous abnormalities. Review of the MIP images confirms the above findings. IMPRESSION: 1. No acute pulmonary embolus. Previous small right upper lobe pulmonary embolus has resolved. 2. Chronic lung disease with multifocal scarring, emphysema and left lung bronchiectasis. There is increased bronchial filling in the left lower lobe from prior exam. 3. Increased left hilar soft  tissue density is nonspecific, however may represent reactive adenopathy. This causes luminal narrowing of the lingular and left lower lobe bronchus. There is short segment near complete left lower lobe bronchial occlusion due to soft tissue density. Bronchoscopic evaluation may be helpful for further evaluation. 4. Enlarged right hilar lymph node, chronic and slightly decreased from prior exam, presumably reactive. Emphysema (ICD10-J43.9). Electronically Signed   By: Lujean Rave.D.  On: 05/31/2017 05:03     Beaulah DinningGambino, Christina M, MD 05/31/2017, 8:39 AM PGY-3, [facility]PT[facility] Intern pager: [phone], text pages welcome

## 2017-06-01 ENCOUNTER — Telehealth: Payer: Self-pay | Admitting: Internal Medicine

## 2017-06-01 ENCOUNTER — Inpatient Hospital Stay (HOSPITAL_COMMUNITY): Payer: Commercial Managed Care - PPO

## 2017-06-01 DIAGNOSIS — A630 Anogenital (venereal) warts: Secondary | ICD-10-CM

## 2017-06-01 DIAGNOSIS — J471 Bronchiectasis with (acute) exacerbation: Secondary | ICD-10-CM

## 2017-06-01 DIAGNOSIS — I5030 Unspecified diastolic (congestive) heart failure: Secondary | ICD-10-CM

## 2017-06-01 DIAGNOSIS — J210 Acute bronchiolitis due to respiratory syncytial virus: Secondary | ICD-10-CM

## 2017-06-01 DIAGNOSIS — J470 Bronchiectasis with acute lower respiratory infection: Secondary | ICD-10-CM

## 2017-06-01 LAB — BASIC METABOLIC PANEL
Anion gap: 11 (ref 5–15)
BUN: 25 mg/dL — AB (ref 6–20)
CO2: 21 mmol/L — ABNORMAL LOW (ref 22–32)
Calcium: 9.8 mg/dL (ref 8.9–10.3)
Chloride: 107 mmol/L (ref 101–111)
Creatinine, Ser: 1.19 mg/dL (ref 0.61–1.24)
GFR calc Af Amer: >60 mL/min (ref >60)
GLUCOSE: 156 mg/dL — AB (ref 65–99)
POTASSIUM: 4.3 mmol/L (ref 3.5–5.1)
SODIUM: 139 mmol/L (ref 135–145)

## 2017-06-01 LAB — CBC
HCT: 36.9 % — ABNORMAL LOW (ref 39.0–52.0)
Hemoglobin: 12.3 g/dL — ABNORMAL LOW (ref 13.0–17.0)
MCH: 32.7 pg (ref 26.0–34.0)
MCHC: 33.3 g/dL (ref 30.0–36.0)
MCV: 98.1 fL (ref 78.0–100.0)
Platelets: 220 10*3/uL (ref 150–400)
RBC: 3.76 MIL/uL — ABNORMAL LOW (ref 4.22–5.81)
RDW: 13.5 % (ref 11.5–15.5)
WBC: 11.3 10*3/uL — AB (ref 4.0–10.5)

## 2017-06-01 LAB — TROPONIN I: Troponin I: <0.03 ng/mL (ref <0.03)

## 2017-06-01 LAB — ECHOCARDIOGRAM COMPLETE
HEIGHTINCHES: 72 in
WEIGHTICAEL: 2816 [oz_av]

## 2017-06-01 LAB — T-HELPER CELLS (CD4) COUNT (NOT AT ARMC)
CD4 % Helper T Cell: 5 % — ABNORMAL LOW (ref 33–55)
CD4 T CELL ABS: 14 /uL — AB (ref 400–2700)

## 2017-06-01 LAB — EXPECTORATED SPUTUM ASSESSMENT W GRAM STAIN, RFLX TO RESP C

## 2017-06-01 LAB — EXPECTORATED SPUTUM ASSESSMENT W REFEX TO RESP CULTURE

## 2017-06-01 LAB — HIV-1 RNA QUANT-NO REFLEX-BLD
HIV 1 RNA Quant: 30 copies/mL
LOG10 HIV-1 RNA: 1.477 {Log_copies}/mL

## 2017-06-01 MED ORDER — IPRATROPIUM-ALBUTEROL 0.5-2.5 (3) MG/3ML IN SOLN
3.0000 mL | Freq: Three times a day (TID) | RESPIRATORY_TRACT | Status: DC
  Administered 2017-06-01 – 2017-06-02 (×4): 3 mL via RESPIRATORY_TRACT
  Filled 2017-06-01 (×5): qty 3

## 2017-06-01 MED ORDER — CALCIUM CARBONATE ANTACID 500 MG PO CHEW
1.0000 | CHEWABLE_TABLET | Freq: Three times a day (TID) | ORAL | Status: DC | PRN
  Administered 2017-06-01 (×2): 200 mg via ORAL
  Filled 2017-06-01 (×2): qty 1

## 2017-06-01 MED ORDER — IPRATROPIUM-ALBUTEROL 0.5-2.5 (3) MG/3ML IN SOLN
3.0000 mL | Freq: Four times a day (QID) | RESPIRATORY_TRACT | Status: DC
  Administered 2017-06-01: 3 mL via RESPIRATORY_TRACT
  Filled 2017-06-01: qty 3

## 2017-06-01 NOTE — Discharge Summary (Signed)
Family Medicine Teaching [facility] Discharge Summary  Patient name: [name] Medical record number: [id number] Date of birth: 04/01/1970 Age: 47 y.o. 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   Cough on deep inspiration but not during conversation  Gastrointestinal:Soft, nondistended, nontender, normal bowel sounds WUJ:WJXB range of motion of all extremities Neuro/psych:Alert /appropriate, cranial nerves grossly intact GU: significant condylomas bilaterally under scrotum ~4" diameter, unchaged from yesterday Psych:Normal mood and affect  Brief Hospital Course:  Patient presented with shortness of breath x2weeks and hx of HIV with low CD4 count.  He was saturating well on room air but given his immunocompromised state was admitted for steroids, cultures and supportive therapy.  Imaging was concerning for soft tissue density on lung and pulmonary recommended further outpatient followup with repeat imaging.  He was RSV positive but blood cultures were neg and vitals remained reassuring during his course.  He also had significant condyloma of the groin and we attempted to track down status of his referal from local urology to [facility] surgery.   We were told by the patient that they did call him during  his visit and although a date was not set he was confident that things were moving forward.  He improved consistently during the admission and was d/c's on 3/20.  He was discharged with levoquin to complete a 7 day course and prednisone to complete a 5 day course.  Issues for Follow Up:  1. Has ID f/u on 3/25, CD4 was 14.  After speaking with ID on call it was advised to continue current meds and let the patient keep his pre-existing appt with [name] as CD4 can drop acutely with illness. 2. Has pulm f/u on 5/14, is supposed to get CT prior, please help coordinate 3. We tried to confirm his surgical referal to [facility] from urology for condyloma and he was called during his stay but no appt confirmed.  Please help him navigate this.  Significant Procedures:   Significant Labs and Imaging:  Recent Labs  Lab 05/31/17 0122 06/01/17 0725  WBC 5.0 11.3*  HGB 11.9* 12.3*  HCT 34.8* 36.9*  PLT 188 220   Recent Labs  Lab 05/31/17 0122 06/01/17 0725  NA 133* 139  K 3.2* 4.3  CL 104 107  CO2 23 21*  GLUCOSE 148* 156*  BUN 15 25*  CREATININE 1.22 1.19  CALCIUM 8.6* 9.8    Dg Chest 1 View  Result Date: 05/31/2017 CLINICAL DATA:  Dyspnea. Patient has been sick 2 weeks with dry cough. EXAM: CHEST  1 VIEW COMPARISON:  04/23/2016 FINDINGS: Chronic subpleural scarring along the periphery of the left mid lung and left lung base with emphysematous hyperinflation both lungs, upper lobe predominant. No overt pulmonary edema, effusion or pneumothorax. No pneumonic consolidations. Tortuous atherosclerotic aorta with top-normal size heart. No acute osseous abnormality. IMPRESSION: Emphysematous hyperinflation of the lungs, upper lobe predominant. Subpleural scarring along the periphery of the left mid lung  and left base. No acute pulmonary consolidation or CHF. Electronically Signed   By: Tollie Eth M.D.   On: 05/31/2017 02:36   Ct Angio Chest Pe W And/or Wo Contrast  Result Date: 05/31/2017 CLINICAL  DATA:  Shortness of breath. Cough for 2 weeks. Shortness of breath for 2 days. History of pulmonary embolus. EXAM: CT ANGIOGRAPHY CHEST WITH CONTRAST TECHNIQUE: Multidetector CT imaging of the chest was performed using the standard protocol during bolus administration of intravenous contrast. Multiplanar CT image reconstructions and MIPs were obtained to evaluate the vascular anatomy. CONTRAST:  69 cc ISOVUE-370 IOPAMIDOL (ISOVUE-370) INJECTION 76% COMPARISON:  Chest radiograph earlier this day.  Chest CT 02/24/2016 FINDINGS: Cardiovascular: Previously seen right upper lobe pulmonary artery filling defect has resolved. No new filling defects in the pulmonary arteries to suggest acute pulmonary embolus. Thoracic aorta is normal in caliber. Heart is normal in size. No pericardial effusion. Mediastinum/Nodes: There is ill-defined left hilar soft tissue density tracking along the lingular and to a lesser extent lower lobe bronchus. Enlarged right hilar node measuring 14 mm is chronic and slightly diminished from prior exam. Small hiatal hernia with slightly patulous esophagus. No thyroid nodule. Lungs/Pleura: Apical predominant emphysema. Right apical and anterior left upper lobe scarring. Left hilar soft tissue density causes narrowing of the lingular bronchus. This also causes narrowing of the left lower lobe bronchus near short-segment bronchial occlusion for example image 76 series 7. Chronic left lower lobe bronchiectasis with increased bronchial filling from prior exam. Minimal lingular and left upper lobe bronchiectasis appears similar. Minimal right lower lobe atelectasis. No pulmonary edema. No pleural effusion. Upper Abdomen: No acute finding. Musculoskeletal: There are no acute or suspicious osseous abnormalities. Review of the MIP images confirms the above findings. IMPRESSION: 1. No acute pulmonary embolus. Previous small right upper lobe pulmonary embolus has resolved. 2. Chronic lung disease with  multifocal scarring, emphysema and left lung bronchiectasis. There is increased bronchial filling in the left lower lobe from prior exam. 3. Increased left hilar soft tissue density is nonspecific, however may represent reactive adenopathy. This causes luminal narrowing of the lingular and left lower lobe bronchus. There is short segment near complete left lower lobe bronchial occlusion due to soft tissue density. Bronchoscopic evaluation may be helpful for further evaluation. 4. Enlarged right hilar lymph node, chronic and slightly decreased from prior exam, presumably reactive. Emphysema (ICD10-J43.9). Electronically Signed   By: Rubye Oaks M.D.   On: 05/31/2017 05:03    Results/Tests Pending at Time of Discharge:   Discharge Medications:  Allergies as of 06/02/2017   No Known Allergies     Medication List    STOP taking these medications   guaiFENesin 100 MG/5ML Soln Commonly known as:  ROBITUSSIN   HYDROcodone-acetaminophen 5-325 MG tablet Commonly known as:  NORCO/VICODIN   ibuprofen 200 MG tablet Commonly known as:  ADVIL,MOTRIN     TAKE these medications   BACLOFEN PO Take 1 tablet by mouth daily as needed (pain).   darunavir-cobicistat 800-150 MG tablet Commonly known as:  PREZCOBIX Take 1 tablet by mouth daily with breakfast.   dolutegravir 50 MG tablet Commonly known as:  TIVICAY Take 1 tablet (50 mg total) by mouth daily.   feeding supplement Liqd Take 1 Container by mouth 3 (three) times daily between meals.   imiquimod 5 % cream Commonly known as:  ALDARA APPLY TOPICALLY 3 (THREE) TIMES A WEEK.   levofloxacin 750 MG tablet Commonly known as:  LEVAQUIN Take 1 tablet (750 mg  total) by mouth daily. Starting 3/21   lidocaine 4 % cream Commonly known as:  LMX Apply 1 application topically 3 (three) times daily as needed. Apply to genital sores for pain relief as needed   mupirocin cream 2 % Commonly known as:  BACTROBAN Apply 1 application topically 2  (two) times daily. Apply to open/sore areas to help with pain and prevent infection   ondansetron 4 MG tablet Commonly known as:  ZOFRAN Take 1 tablet (4 mg total) by mouth every 8 (eight) hours as needed. kichefuchefu (nausea in swahili)   predniSONE 50 MG tablet Commonly known as:  DELTASONE Take 1 tablet (50 mg total) by mouth daily with breakfast. starting 3/21   sulfamethoxazole-trimethoprim 800-160 MG tablet Commonly known as:  BACTRIM DS,SEPTRA DS TAKE 1 TABLET BY MOUTH ONCE DAILY   traMADol 50 MG tablet Commonly known as:  ULTRAM Take 50 mg by mouth every 4 (four) hours as needed for pain.       Discharge Instructions: Please refer to Patient Instructions section of EMR for full details.  Patient was counseled important signs and symptoms that should prompt return to medical care, changes in medications, dietary instructions, activity restrictions, and follow up appointments.   Follow-Up Appointments: Follow-up Information    Kalman Shanamaswamy, Murali, MD Follow up on 07/27/2017.   Specialty:  Pulmonary Disease Why:  1100 am with  Dr. Marchelle Gearingamaswamy. Our office will arrange ct scan chest prior to appointment Contact information: [address] [place] [address] [phone]           Marthenia RollingBland, Cruz Bong, DO 06/03/2017, 5:19 PM PGY-1, [facility]

## 2017-06-01 NOTE — Telephone Encounter (Signed)
I think you should ask STeve or Dr Molli KnockYacoub who ordered it. However, in review of chart I tink you can go with bronchiectasis and HIV  Dr. Kalman ShanMurali Ygnacio Fecteau, M.D., [facility] Pulmonary and Critical Care Medicine Staff Physician, [facility] Center Director - Interstitial Lung Disease  Program  Pulmonary Fibrosis [facility] [place], [place], [address]  Pager: [phone], If no answer or between  15:00h - 7:00h: call [phone] Telephone: [phone]

## 2017-06-01 NOTE — Telephone Encounter (Signed)
Ok CT ordered to be done at the beginning of may before the appt. Nothing further is needed.

## 2017-06-01 NOTE — Progress Notes (Signed)
Family Medicine Teaching Service Daily Progress Note Intern Pager: [phone]  Patient name: [name] Medical record number: [id number] Date of birth: 12/11/1970 Age: 47 y.o. Gender: male  Primary Care Provider: Tobey GrimWalden, Jeffrey H, MD Consultants: pulm Code Status: FULL  Pt Overview and Major Events to Date:  [name] is a 47 y.o. male presenting with shortness of breath and nonproductive cough x 2 weeks. PMH is significant for HIV with AIDS, HFpEF, CKD II, genital warts.   Assessment and Plan: [name] is a 47 y.o. male presenting with shortness of breath and nonproductive cough x 2 weeks. PMH is significant for HIV with AIDS, HFpEF, CKD II, genital warts.   Dyspnea, cough, intermittent hypoxia: Improved.  comfortable on room air now w/ Normal respiratory rate. Exam showingrhonchorous breath sounds on R and faint wheezing.  Lab work showing no leukocytosis (WBC of 5 however can be low in AIDS). CTA negative for PE however does show emphysematous changes and a left hilar soft tissue density causing narrowing of the lingular and left lower lobe bronchus.  Less likely CHF exacerbation given low BNP of 10.5 and no signs of fluid overload.  Leading differentials include acute bronchitis vs COPD exacerbation (history of tobacco abuse 15 years ago) vs bronchus obstruction (as seen on CTA).   Flu neg.  -Admit to family medicine teaching service, MedSurg, attending Dr. Pollie MeyerMcIntyre -Vital signs per floor protocol -Continuous pulse ox -O2 supplementation as needed - Duo nebs every 4 hours -prednisone 50 mg daily  for 5-day course -Influenza panel, respiratory viral panel, legionella,  -sputum culture pending  -Blood cultures pending -Continue home Bactrim -Discussed CTA with pulmonologist Dr. Molli KnockYacoub who reviewed CTA over the phone. Obstruction was consistent with phlegm, suggested pulmonary toilet with aggressive assistance from RT as well as sputum studies. No bronchoscopy  recommended at this time  RSV positive: via RVP -droplet precautions -O2 supplementation as needed  HIV with AIDS: last HIV viral load 78 and CD4 count 100 on 04/15/17.  Follows with Dr. Drue SecondSnider. Has had TB and PCP in the past. Questionable antiretroviral compliance, patient notes that he is compliant however given his last CD4 and viral load is likely not. -Continue home PREZCOBIX and TIVICAY -Continue home Bactrim  HFpEF: Has hx of this however his last echocardiogram in December 2017 showing EF of 45-50%, no diastolic dysfunction.  No signs of fluid overload on exam or on imaging -Not on any beta blocker or ACE or diuretic  ?EKG changes: No chest pain. Troponin 0.  EKG reading as ST elevation however do not see this, there are T wave inversions on the V1 and aVR, no prior EKG to compare. -Repeat EKG  Condylomas affected bilateral groin/scrotal: Has been following with ID for this.  Was referred to general surgery however there are concerns given his low CD4 count.  [facility] has not heard back from them yet. -Continue home lidocaine cream -Continue home imiquimod and Bactroban ointment  Hypokalemia: K 3.2 -BMP tomorrow -40 of K-Dur  FEN/GI: SLIV, regular diet Prophylaxis: Lovenox  Disposition: Admit to med surg, attending Dr. Pollie MeyerMcIntyre  Subjective:  Feeling improved, is more concerned with condylomas than anything else.  Is insistent that he has been regular on HIV meds for 4 years.  Objective: Temp:  [98.1 F (36.7 C)-98.4 F (36.9 C)] 98.4 F (36.9 C) (03/18 2100) Pulse Rate:  [89-124] 103 (03/18 2100) Resp:  [15-21] 20 (03/18 2100) BP: (109-142)/(69-80) 137/77 (03/18 2100)  SpO2:  [91 %-97 %] 95 % (03/18 2100) Physical Exam: General: In no acute distress, sitting up in bed alert, conversation Eyes: nonicteri/noninjected sclera Cardiovascular: Regular rate and rhythm, normal S1-S2, no murmurs appreciated, no edema Respiratory: Normal work of  breathing, rhonchorous lung sounds heard on lower R w/ faint wheezing  Gastrointestinal: Soft, nondistended, nontender, normal bowel sounds MSK: Full range of motion of all extremities  Derm: no rashes Neuro: Alert and oriented x3, cranial nerves II through XII intact, 5 out of 5 strength throughout, sensation grossly intact throughout GU: significant condylomas bilaterally under scrotum ~4" diameter Psych: Normal mood and affect  Laboratory: Recent Labs  Lab 05/31/17 0122  WBC 5.0  HGB 11.9*  HCT 34.8*  PLT 188   Recent Labs  Lab 05/31/17 0122  NA 133*  K 3.2*  CL 104  CO2 23  BUN 15  CREATININE 1.22  CALCIUM 8.6*  GLUCOSE 148*    Imaging/Diagnostic Tests: Dg Chest 1 View  Result Date: 05/31/2017 CLINICAL DATA:  Dyspnea. Patient has been sick 2 weeks with dry cough. EXAM: CHEST  1 VIEW COMPARISON:  04/23/2016 FINDINGS: Chronic subpleural scarring along the periphery of the left mid lung and left lung base with emphysematous hyperinflation both lungs, upper lobe predominant. No overt pulmonary edema, effusion or pneumothorax. No pneumonic consolidations. Tortuous atherosclerotic aorta with top-normal size heart. No acute osseous abnormality. IMPRESSION: Emphysematous hyperinflation of the lungs, upper lobe predominant. Subpleural scarring along the periphery of the left mid lung and left base. No acute pulmonary consolidation or CHF. Electronically Signed   By: Tollie Eth M.D.   On: 05/31/2017 02:36   Ct Angio Chest Pe W And/or Wo Contrast  Result Date: 05/31/2017 CLINICAL DATA:  Shortness of breath. Cough for 2 weeks. Shortness of breath for 2 days. History of pulmonary embolus. EXAM: CT ANGIOGRAPHY CHEST WITH CONTRAST TECHNIQUE: Multidetector CT imaging of the chest was performed using the standard protocol during bolus administration of intravenous contrast. Multiplanar CT image reconstructions and MIPs were obtained to evaluate the vascular anatomy. CONTRAST:  69 cc  ISOVUE-370 IOPAMIDOL (ISOVUE-370) INJECTION 76% COMPARISON:  Chest radiograph earlier this day.  Chest CT 02/24/2016 FINDINGS: Cardiovascular: Previously seen right upper lobe pulmonary artery filling defect has resolved. No new filling defects in the pulmonary arteries to suggest acute pulmonary embolus. Thoracic aorta is normal in caliber. Heart is normal in size. No pericardial effusion. Mediastinum/Nodes: There is ill-defined left hilar soft tissue density tracking along the lingular and to a lesser extent lower lobe bronchus. Enlarged right hilar node measuring 14 mm is chronic and slightly diminished from prior exam. Small hiatal hernia with slightly patulous esophagus. No thyroid nodule. Lungs/Pleura: Apical predominant emphysema. Right apical and anterior left upper lobe scarring. Left hilar soft tissue density causes narrowing of the lingular bronchus. This also causes narrowing of the left lower lobe bronchus near short-segment bronchial occlusion for example image 76 series 7. Chronic left lower lobe bronchiectasis with increased bronchial filling from prior exam. Minimal lingular and left upper lobe bronchiectasis appears similar. Minimal right lower lobe atelectasis. No pulmonary edema. No pleural effusion. Upper Abdomen: No acute finding. Musculoskeletal: There are no acute or suspicious osseous abnormalities. Review of the MIP images confirms the above findings. IMPRESSION: 1. No acute pulmonary embolus. Previous small right upper lobe pulmonary embolus has resolved. 2. Chronic lung disease with multifocal scarring, emphysema and left lung bronchiectasis. There is increased bronchial filling in the left lower lobe from prior exam. 3. Increased left  hilar soft tissue density is nonspecific, however may represent reactive adenopathy. This causes luminal narrowing of the lingular and left lower lobe bronchus. There is short segment near complete left lower lobe bronchial occlusion due to soft tissue  density. Bronchoscopic evaluation may be helpful for further evaluation. 4. Enlarged right hilar lymph node, chronic and slightly decreased from prior exam, presumably reactive. Emphysema (ICD10-J43.9). Electronically Signed   By: Rubye Oaks M.D.   On: 05/31/2017 05:03     Marthenia Rolling, DO 06/01/2017, 7:19 AM PGY-1, [facility] FPTS Intern pager: [phone], text pages welcome

## 2017-06-01 NOTE — Telephone Encounter (Signed)
MR-please advise what to associate with and is CM is needed  Thanks

## 2017-06-01 NOTE — Progress Notes (Addendum)
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  No fever, hemodynamically stable, CTA chest negative for PE but did show a left hilar soft tissue density causing narrowing of the lingula and left lower lobe bronchus.  PCCM consulted for further workup.   SIGNIFICANT EVENTS                STUDIES:  CTA chest 3/18>>> 1. No acute pulmonary embolus. Previous small right upper lobe pulmonary embolus has resolved. 2. Chronic lung disease with multifocal scarring, emphysema and left lung bronchiectasis. There is increased bronchial filling in the left lower lobe from prior exam. 3. Increased left hilar soft tissue density is nonspecific, however may represent reactive adenopathy. This causes luminal narrowing of the lingular and left lower lobe bronchus. There is short segment near complete left lower lobe bronchial occlusion due to soft tissue density. Bronchoscopic evaluation may be helpful for further evaluation. 4. Enlarged right hilar lymph node, chronic and slightly decreased from prior exam, presumably reactive.  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  No fever, hemodynamically stable, CTA chest negative for PE but did show a left hilar soft tissue density causing narrowing of the lingula and left lower lobe bronchus.  PCCM consulted for further workup.   Patient is former smoker but has not smoked in the last 15 years. Denies chest pain, hemoptysis, night sweats, fever, leg or calf  pain, recent travel.  SUBJECTIVE:  No acute distress at rest  VITAL SIGNS: Temp:  [98.1 F (36.7 C)-98.4 F (36.9 C)] 98.4 F (36.9 C) (03/18 2100) Pulse Rate:  [102-124] 102 (03/19 0850) Resp:  [15-21] 20 (03/19 0850) BP: (121-142)/(73-80) 137/77 (03/18 2100) SpO2:  [92 %-97 %] 97 % (03/19 0850)  PHYSICAL EXAMINATION: General: Awake alert does not speak English HEENT: No JVD or lymphadenopathy. PSY: Appears normal Neuro: No overt deficits CV: Heart sounds are regular rate rate and rhythm PULM: Decreased in the bases WU:JWJX, non-tender, bsx4 active  GU: Penile warts have been addressed by the wound ostomy care Extremities: warm/dry, negative edema  Skin: no rashes or lesions  Recent Labs  Lab 05/31/17 0122 06/01/17 0725  NA 133* 139  K 3.2* 4.3  CL 104 107  CO2 23 21*  BUN 15 25*  CREATININE 1.22 1.19  GLUCOSE 148* 156*   Recent Labs  Lab 05/31/17 0122 06/01/17 0725  HGB 11.9* 12.3*  HCT 34.8* 36.9*  WBC 5.0 11.3*  PLT 188 220   Dg Chest 1 View  Result Date: 05/31/2017 CLINICAL DATA:  Dyspnea. Patient has been sick 2 weeks with dry cough. EXAM: CHEST  1 VIEW COMPARISON:  04/23/2016 FINDINGS: Chronic subpleural scarring along the periphery of the left mid lung and left lung base with emphysematous hyperinflation both lungs, upper lobe predominant. No overt pulmonary edema, effusion or pneumothorax. No pneumonic consolidations. Tortuous atherosclerotic aorta with top-normal size heart. No acute osseous abnormality. IMPRESSION: Emphysematous hyperinflation of the lungs, upper lobe predominant. Subpleural scarring along the periphery of the left mid lung and left  base. No acute pulmonary consolidation or CHF. Electronically Signed   By: Tollie Eth M.D.   On: 05/31/2017 02:36   Ct Angio Chest Pe W And/or Wo Contrast  Result Date: 05/31/2017 CLINICAL DATA:  Shortness of breath. Cough for 2 weeks. Shortness of breath for 2 days. History of pulmonary embolus. EXAM: CT  ANGIOGRAPHY CHEST WITH CONTRAST TECHNIQUE: Multidetector CT imaging of the chest was performed using the standard protocol during bolus administration of intravenous contrast. Multiplanar CT image reconstructions and MIPs were obtained to evaluate the vascular anatomy. CONTRAST:  69 cc ISOVUE-370 IOPAMIDOL (ISOVUE-370) INJECTION 76% COMPARISON:  Chest radiograph earlier this day.  Chest CT 02/24/2016 FINDINGS: Cardiovascular: Previously seen right upper lobe pulmonary artery filling defect has resolved. No new filling defects in the pulmonary arteries to suggest acute pulmonary embolus. Thoracic aorta is normal in caliber. Heart is normal in size. No pericardial effusion. Mediastinum/Nodes: There is ill-defined left hilar soft tissue density tracking along the lingular and to a lesser extent lower lobe bronchus. Enlarged right hilar node measuring 14 mm is chronic and slightly diminished from prior exam. Small hiatal hernia with slightly patulous esophagus. No thyroid nodule. Lungs/Pleura: Apical predominant emphysema. Right apical and anterior left upper lobe scarring. Left hilar soft tissue density causes narrowing of the lingular bronchus. This also causes narrowing of the left lower lobe bronchus near short-segment bronchial occlusion for example image 76 series 7. Chronic left lower lobe bronchiectasis with increased bronchial filling from prior exam. Minimal lingular and left upper lobe bronchiectasis appears similar. Minimal right lower lobe atelectasis. No pulmonary edema. No pleural effusion. Upper Abdomen: No acute finding. Musculoskeletal: There are no acute or suspicious osseous abnormalities. Review of the MIP images confirms the above findings. IMPRESSION: 1. No acute pulmonary embolus. Previous small right upper lobe pulmonary embolus has resolved. 2. Chronic lung disease with multifocal scarring, emphysema and left lung bronchiectasis. There is increased bronchial filling in the left lower lobe from  prior exam. 3. Increased left hilar soft tissue density is nonspecific, however may represent reactive adenopathy. This causes luminal narrowing of the lingular and left lower lobe bronchus. There is short segment near complete left lower lobe bronchial occlusion due to soft tissue density. Bronchoscopic evaluation may be helpful for further evaluation. 4. Enlarged right hilar lymph node, chronic and slightly decreased from prior exam, presumably reactive. Emphysema (ICD10-J43.9). Electronically Signed   By: Rubye Oaks M.D.   On: 05/31/2017 05:03    ASSESSMENT / PLAN:  Abnormal CT chest-left hilar soft tissue density with narrowing of the lingula and left lower lobe bronchus in the setting of probable acute bronchitis +/- COPD exacerbation (unclear if he has true COPD).  Obstruction on CT scan most consistent with mucous plugging.  Patient is afebrile, not hypoxic.  Plan- Continue aggressive pulmonary hygiene  Follow culture data  Taper steroids  Have made an appointment for him with Dr.  Marchelle Gearing on Jul 27, 2017 11 AM ,with a follow-up CT scan to better evaluate left hilar soft tissue density.  All other treatment per teaching service  P CCM will be available as needed.  Brett Canales Minor ACNP Adolph Pollack PCCM Pager [phone] till 1 pm If no answer page [phone] 06/01/2017, 11:09 AM  Attending Note:  47 year old male with HIV that is poorly compliant with medication presenting with SOB.  CTA done to r/o PE found narrowing in LLL bronchus concerning for endobronchial lesion and severe bronchiectatic changes in the same lobe.  I repeated  CXR today and some densities were noted but I do not appreciate any post obstructive type consolidation.  On exam, there are BS on the LLL.  This is more consistent with bronchiectasis induced mucous plugging.  Discussed with PCCM-NP  Mucous plugging: can not rule out a mass at this point but will be difficult to ascertain.  But with RSV positive  disease and bronchiectasis this would be more consistent with sputum than a primary malignancy.              - Vibravest             - Flutter valve             - Aggressive pulmonary toilet             - No role for bronchoscopy in the acute phase              - Treat bronchiectasis as below then repeat CT in 4-6 weeks with f/u with Dr. Marchelle Gearingamaswamy as above.     Bronchiectasis exacerbation             - Continue bactrim              - Recommend adding a flouroquinolone for pseudomonal control.             - F/U on culture             - Steroids as ordered             - Bronchodilators as ordered  Bronchitis             - Bactrim             - RVP positive with RSV             - Urine strep and legionella             - F/U on cultures  PCCM will be available PRN  Patient seen and examined, agree with above note.  I dictated the care and orders written for this patient under my direction.  Alyson ReedyYacoub, Cristhian Vanhook G, MD [phone]

## 2017-06-01 NOTE — Consult Note (Signed)
WOC Nurse wound consult note Reason for Consult: genital warts to perineal area.  Weeping and moist in intertriginous folds to groin.  Will begin Interdry skin fold management system.  Wound type:genital warts.  Viral etiology.   Pressure Injury POA: NA Measurement: Scattered intact and weeping blisters to perineum and intertriginous areas.  Wound ZOX:WRUEbed:pink and moist Drainage (amount, consistency, odor) scant weeping Periwound:intact Dressing procedure/placement/frequency: To groin and intertriginous warts:  Lawson# [id number] Measure and cut length of InterDry Ag+ to fit in skin folds that have skin breakdown Tuck InterDry  Ag+ fabric into skin folds in a single layer, allow for 2 inches of overhang from skin edges to allow for wicking to occur May remove to bathe; dry area thoroughly and then tuck into affected areas again Do not apply any creams or ointments when using InterDry Ag+ DO NOT THROW AWAY FOR 5 DAYS unless soiled with stool DO NOT [facility] product, this will inactivate the silver in the material  New sheet of Interdry Ag+ should be applied after 5 days of use if patient continues to have skin breakdown   Will not follow at this time.  Please re-consult if needed.  Maple HudsonKaren Aveon Colquhoun RN BSN CWON Pager [phone]

## 2017-06-01 NOTE — Progress Notes (Signed)
  Echocardiogram 2D Echocardiogram has been performed.  [name]  [name] 06/01/2017, 4:14 PM

## 2017-06-02 ENCOUNTER — Other Ambulatory Visit: Payer: Self-pay

## 2017-06-02 LAB — EXPECTORATED SPUTUM ASSESSMENT W GRAM STAIN, RFLX TO RESP C

## 2017-06-02 LAB — LEGIONELLA PNEUMOPHILA SEROGP 1 UR AG: L. pneumophila Serogp 1 Ur Ag: NEGATIVE

## 2017-06-02 MED ORDER — PREDNISONE 50 MG PO TABS: 50.0000 mg | ORAL_TABLET | Freq: Every day | ORAL | 0 refills | Status: DC

## 2017-06-02 MED ORDER — LEVOFLOXACIN 750 MG PO TABS: 750.0000 mg | ORAL_TABLET | Freq: Every day | ORAL | 0 refills | Status: DC

## 2017-06-02 MED ORDER — LEVOFLOXACIN 750 MG PO TABS
750.0000 mg | ORAL_TABLET | Freq: Every day | ORAL | Status: DC
  Administered 2017-06-02: 750 mg via ORAL
  Filled 2017-06-02: qty 1

## 2017-06-02 NOTE — Progress Notes (Signed)
Family Medicine Teaching Service Daily Progress Note Intern Pager: [phone]  Patient name: [name] Medical record number: [id number] Date of birth: 05/07/1970 Age: 47 y.o. Gender: male  Primary Care Provider: Tobey GrimWalden, Jeffrey H, MD Consultants: pulm Code Status: FULL  Pt Overview and Major Events to Date:  Anthony Maryelizabeth Kaufmann Kamau is a 47 y.o. male presenting with shortness of breath and nonproductive cough x 2 weeks. PMH is significant for HIV with AIDS, HFpEF, CKD II, genital warts.   Assessment and Plan: [name] is a 47 y.o. male presenting with shortness of breath and nonproductive cough x 2 weeks. PMH is significant for HIV with AIDS, HFpEF, CKD II, genital warts.   Dyspnea, cough, intermittent hypoxia: Improved.  comfortable on room air now w/ Normal respiratory rate. Exam showingrhonchorous breath sounds on R and faint wheezing.  Lab work showing no leukocytosis (WBC of 5 however can be low in AIDS). CTA negative for PE however does show emphysematous changes and a left hilar soft tissue density causing narrowing of the lingular and left lower lobe bronchus.  Less likely CHF exacerbation given low BNP of 10.5 and no signs of fluid overload.  Leading differentials include acute bronchitis vs COPD exacerbation (history of tobacco abuse 15 years ago) vs bronchus obstruction (as seen on CTA).   Flu neg.  -CCM directly pulmonary w/u -O2 supplementation as needed - Duo nebs TID -prednisone 50 mg daily  for 5-day course (3/18- ) -sputum culture needed recollection -Blood cultures pending (no growth <24 hrs) -Continue home Bactrim -f/u CT ordered in may prior to may 14 appt per CCM  RSV positive: via RVP -droplet precautions -O2 supplementation as needed  HIV with AIDS: CD4 14.  last HIV viral load 78 and CD4 count 100 on 04/15/17.  Follows with Dr. Drue SecondSnider. Has had TB and PCP in the past. Questionable antiretroviral compliance, patient notes that he is compliant however  given his last CD4 and viral load is likely not. -has ID f/u 3/25 -Continue home PREZCOBIX and TIVICAY -Continue home Bactrim -will order increased prohylactic meds (only on bactrim currently)  HFpEF: Has hx of this however his last echocardiogram in December 2017 showing EF of 45-50%, no diastolic dysfunction.  No signs of fluid overload on exam or on imaging -Not on any beta blocker or ACE or diuretic  ?EKG changes: No chest pain. Troponin 0.  EKG reading as ST elevation however do not see this, there are T wave inversions on the V1 and aVR, no prior EKG to compare. -Repeat EKG not administered, changed priority to stat  Condylomas affected bilateral groin/scrotal: Has been following with ID for this.  Was referred to general surgery however there are concerns given his low CD4 count.  [facility] has not heard [facility] from them yet. -wound care is dressing as inpatient, no creams with current 5 days dressing (3/18- ) -[facility], thecalled patient yesterday and said they would call back again today, date still undetermined  FEN/GI: SLIV, regular diet Prophylaxis: Lovenox  Disposition: Admit to med surg, attending Dr. Pollie MeyerMcIntyre  Subjective:  Patient feeling improved to go home.  Still cough on deep inspiration but very stable on RA.  Objective: Temp:  [98.1 F (36.7 C)-98.6 F (37 C)] 98.4 F (36.9 C) (03/20 0536) Pulse Rate:  [0-102] 89 (03/20 0536) Resp:  [20] 20 (03/20 0536) BP: (122-139)/(81-86) 122/86 (03/20 0536) SpO2:  [95 %-97 %] 95 % (  03/20 0536) Physical Exam: General: In no acute distress, sitting up in bed alert, conversation Eyes: nonicteri/noninjected sclera Cardiovascular: Regular rate and rhythm, normal S1-S2, no murmurs appreciated, no edema Respiratory: Normal work of breathing, rhonchorous lung sounds with deep inspiration, no wheezing.   Cough on deep inspiration but not during conversation   Gastrointestinal: Soft, nondistended, nontender, normal bowel sounds MSK: Full range of motion of all extremities  Neuro/psych: Alert /appropriate, cranial nerves grossly intact GU: significant condylomas bilaterally under scrotum ~4" diameter, unchaged from yesterday Psych: Normal mood and affect  Laboratory: Recent Labs  Lab 05/31/17 0122 06/01/17 0725  WBC 5.0 11.3*  HGB 11.9* 12.3*  HCT 34.8* 36.9*  PLT 188 220   Recent Labs  Lab 05/31/17 0122 06/01/17 0725  NA 133* 139  K 3.2* 4.3  CL 104 107  CO2 23 21*  BUN 15 25*  CREATININE 1.22 1.19  CALCIUM 8.6* 9.8  GLUCOSE 148* 156*    Imaging/Diagnostic Tests: Dg Chest 1 View  Result Date: 05/31/2017 CLINICAL DATA:  Dyspnea. Patient has been sick 2 weeks with dry cough. EXAM: CHEST  1 VIEW COMPARISON:  04/23/2016 FINDINGS: Chronic subpleural scarring along the periphery of the left mid lung and left lung base with emphysematous hyperinflation both lungs, upper lobe predominant. No overt pulmonary edema, effusion or pneumothorax. No pneumonic consolidations. Tortuous atherosclerotic aorta with top-normal size heart. No acute osseous abnormality. IMPRESSION: Emphysematous hyperinflation of the lungs, upper lobe predominant. Subpleural scarring along the periphery of the left mid lung and left base. No acute pulmonary consolidation or CHF. Electronically Signed   By: Tollie Eth M.D.   On: 05/31/2017 02:36   Ct Angio Chest Pe W And/or Wo Contrast  Result Date: 05/31/2017 CLINICAL DATA:  Shortness of breath. Cough for 2 weeks. Shortness of breath for 2 days. History of pulmonary embolus. EXAM: CT ANGIOGRAPHY CHEST WITH CONTRAST TECHNIQUE: Multidetector CT imaging of the chest was performed using the standard protocol during bolus administration of intravenous contrast. Multiplanar CT image reconstructions and MIPs were obtained to evaluate the vascular anatomy. CONTRAST:  69 cc ISOVUE-370 IOPAMIDOL (ISOVUE-370) INJECTION 76%  COMPARISON:  Chest radiograph earlier this day.  Chest CT 02/24/2016 FINDINGS: Cardiovascular: Previously seen right upper lobe pulmonary artery filling defect has resolved. No new filling defects in the pulmonary arteries to suggest acute pulmonary embolus. Thoracic aorta is normal in caliber. Heart is normal in size. No pericardial effusion. Mediastinum/Nodes: There is ill-defined left hilar soft tissue density tracking along the lingular and to a lesser extent lower lobe bronchus. Enlarged right hilar node measuring 14 mm is chronic and slightly diminished from prior exam. Small hiatal hernia with slightly patulous esophagus. No thyroid nodule. Lungs/Pleura: Apical predominant emphysema. Right apical and anterior left upper lobe scarring. Left hilar soft tissue density causes narrowing of the lingular bronchus. This also causes narrowing of the left lower lobe bronchus near short-segment bronchial occlusion for example image 76 series 7. Chronic left lower lobe bronchiectasis with increased bronchial filling from prior exam. Minimal lingular and left upper lobe bronchiectasis appears similar. Minimal right lower lobe atelectasis. No pulmonary edema. No pleural effusion. Upper Abdomen: No acute finding. Musculoskeletal: There are no acute or suspicious osseous abnormalities. Review of the MIP images confirms the above findings. IMPRESSION: 1. No acute pulmonary embolus. Previous small right upper lobe pulmonary embolus has resolved. 2. Chronic lung disease with multifocal scarring, emphysema and left lung bronchiectasis. There is increased bronchial filling in the left lower lobe from prior exam.  3. Increased left hilar soft tissue density is nonspecific, however may represent reactive adenopathy. This causes luminal narrowing of the lingular and left lower lobe bronchus. There is short segment near complete left lower lobe bronchial occlusion due to soft tissue density. Bronchoscopic evaluation may be helpful for  further evaluation. 4. Enlarged right hilar lymph node, chronic and slightly decreased from prior exam, presumably reactive. Emphysema (ICD10-J43.9). Electronically Signed   By: Rubye Oaks M.D.   On: 05/31/2017 05:03     Marthenia Rolling, DO 06/02/2017, 7:10 AM PGY-1, [facility] Family Medicine FPTS Intern pager: [phone], text pages welcome

## 2017-06-02 NOTE — Progress Notes (Signed)
SATURATION QUALIFICATIONS: (This note is used to comply with regulatory documentation for home oxygen)  Patient Saturations on Room Air at Rest = 97%  Patient Saturations on Room Air while Ambulating = 94%   Please briefly explain why patient needs home oxygen:  Does not need home O2

## 2017-06-02 NOTE — Progress Notes (Signed)
[name] to be D/C'd  per MD order. Discussed with the patient and all questions fully answered.  VSS, Skin clean, dry and intact without evidence of skin break down, no evidence of skin tears noted.  IV catheter discontinued intact. Site without signs and symptoms of complications. Dressing and pressure applied.  An After Visit Summary was printed and given to the patient. Patient received notice of where to pick up prescription.  D/c education completed with patient/family including follow up instructions, medication list, d/c activities limitations if indicated, with other d/c instructions as indicated by MD - patient able to verbalize understanding, all questions fully answered.   Patient instructed to return to ED, call 911, or call MD for any changes in condition.   Patient will be D/C home via private auto.

## 2017-06-04 LAB — CULTURE, RESPIRATORY: CULTURE: NORMAL

## 2017-06-04 LAB — CULTURE, RESPIRATORY W GRAM STAIN

## 2017-06-05 LAB — CULTURE, BLOOD (ROUTINE X 2)
Culture: NO GROWTH
Culture: NO GROWTH
SPECIAL REQUESTS: ADEQUATE
Special Requests: ADEQUATE

## 2017-06-07 ENCOUNTER — Ambulatory Visit (INDEPENDENT_AMBULATORY_CARE_PROVIDER_SITE_OTHER): Payer: Commercial Managed Care - PPO | Admitting: Internal Medicine

## 2017-06-07 ENCOUNTER — Telehealth: Payer: Self-pay

## 2017-06-07 ENCOUNTER — Encounter: Payer: Self-pay | Admitting: Internal Medicine

## 2017-06-07 VITALS — BP 104/67 | HR 105 | Temp 98.3°F | Wt 245.0 lb

## 2017-06-07 DIAGNOSIS — N5089 Other specified disorders of the male genital organs: Secondary | ICD-10-CM | POA: Diagnosis not present

## 2017-06-07 DIAGNOSIS — B20 Human immunodeficiency virus [HIV] disease: Secondary | ICD-10-CM | POA: Diagnosis not present

## 2017-06-07 DIAGNOSIS — J471 Bronchiectasis with (acute) exacerbation: Secondary | ICD-10-CM

## 2017-06-07 DIAGNOSIS — A630 Anogenital (venereal) warts: Secondary | ICD-10-CM | POA: Diagnosis not present

## 2017-06-07 MED ORDER — MUPIROCIN CALCIUM 2 % EX CREA: 1.0000 "application " | TOPICAL_CREAM | Freq: Two times a day (BID) | CUTANEOUS | 4 refills | Status: DC

## 2017-06-07 NOTE — Patient Instructions (Signed)
Continue to take bactrim once a day  We have refilled your mupirocin cream  Come back next week labs to check your cd 4 count and viral load

## 2017-06-07 NOTE — Telephone Encounter (Signed)
Per Dr. Drue SecondSnider I called pts pharmacy [facility] to refill two of the pts medications. First one was Mupirocin cream (Bactroban) 2%: Apply 1 application topically 2 times daily Apply to open/sore areas to help with pain and prevent infection.  30 g 4 refills.  Bactrim 800-160 Mg 30 tabs 2 refills.  Pharmacist was able to take and confirm the refill request and stated he would call if they had any questions. [name], [place]

## 2017-06-07 NOTE — Progress Notes (Signed)
Rfv: follow up on hiv disease  Patient ID: [name], male   DOB: 11/13/1970, 47 y.o.   MRN: [id number]  HPI [name] is a 47yo M,speaks swahili-french, with advanced hiv disease, CD 4 count of 14 and VL30. On tivicay,descovy, prezcobix. But also has significant burden of Extensive genital warts to groin. He has been evaluated by local urologist and general surgery who recommend [facility] who can provide tandem surgical team which also may involve plastic surgery. He had secondary bacterial infection. Now improved with using mupirocin. He is hardly using any pain medication since using the ointment. He would like a refill  He was recently hospitalized for flu like symptoms and pneumonia, finishing course of bactrim  Outpatient Encounter Medications as of 06/07/2017  Medication Sig  . BACLOFEN PO Take 1 tablet by mouth daily as needed (pain).  Marland Kitchen. darunavir-cobicistat (PREZCOBIX) 800-150 MG tablet Take 1 tablet by mouth daily with breakfast.  . dolutegravir (TIVICAY) 50 MG tablet Take 1 tablet (50 mg total) by mouth daily.  . feeding supplement (BOOST / RESOURCE BREEZE) LIQD Take 1 Container by mouth 3 (three) times daily between meals.  . imiquimod (ALDARA) 5 % cream APPLY TOPICALLY 3 (THREE) TIMES A WEEK.  Marland Kitchen. levofloxacin (LEVAQUIN) 750 MG tablet Take 1 tablet (750 mg total) by mouth daily. Starting 3/21  . lidocaine (LMX) 4 % cream Apply 1 application topically 3 (three) times daily as needed. Apply to genital sores for pain relief as needed  . mupirocin cream (BACTROBAN) 2 % Apply 1 application topically 2 (two) times daily. Apply to open/sore areas to help with pain and prevent infection  . ondansetron (ZOFRAN) 4 MG tablet Take 1 tablet (4 mg total) by mouth every 8 (eight) hours as needed. kichefuchefu (nausea in swahili)  . predniSONE (DELTASONE) 50 MG tablet Take 1 tablet (50 mg total) by mouth daily with breakfast. starting 3/21  .  sulfamethoxazole-trimethoprim (BACTRIM DS,SEPTRA DS) 800-160 MG tablet TAKE 1 TABLET BY MOUTH ONCE DAILY  . traMADol (ULTRAM) 50 MG tablet Take 50 mg by mouth every 4 (four) hours as needed for pain.   No facility-administered encounter medications on file as of 06/07/2017.      Patient Active Problem List   Diagnosis Date Noted  . Acute bronchiolitis due to respiratory syncytial virus (RSV)   . Bronchiectasis with acute lower respiratory infection ([name])   . Shortness of breath 05/31/2017  . Pulmonary emphysema ([name])   . 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 Chronic systolic CHF (congestive heart failure) ([name]) 04/24/2016  . AKI (acute kidney injury) ([name]) 04/24/2016  . Flu 04/24/2016  . 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 Hypoxia   . Acute respiratory failure with hypoxia ([name]) 02/25/2016  . Normochromic normocytic anemia 02/25/2016  . Pneumonia 02/25/2016  . Protein-calorie malnutrition, severe 02/25/2016  . PE (pulmonary thromboembolism) ([name]) 02/24/2016  . Renal insufficiency   . Surgery, elective   . AIDS (acquired immune deficiency syndrome) ([name])   . S/P ORIF (open reduction internal fixation) fracture 03/21/2014  . Low back pain 10/10/2013  . Multifocal pneumonia 09/27/2013  . HIV (human immunodeficiency virus infection) ([name]) 09/18/2013  . Refugee health examination 09/18/2013  . Cough 09/18/2013     Health Maintenance Due  Topic Date Due  . TETANUS/TDAP  03/16/1990     Review of Systems  Physical Exam   BP 104/67   Pulse Marland Kitchen(!)  105   Temp 98.3 F (36.8 C) (Oral)   Wt 245 lb (111.1 kg)   BMI 33.23 kg/m    Lab Results  Component Value Date   CD4TCELL 5 (L) 05/31/2017   Lab Results  Component Value Date   CD4TABS 14 (L) 05/31/2017   CD4TABS 100 (L) 04/15/2017   CD4TABS 90 (L) 03/29/2017   Lab Results  Component Value Date   HIV1RNAQUANT 30 05/31/2017   No results found  for: HEPBSAB Lab Results  Component Value Date   LABRPR NON-REACTIVE 11/30/2016    CBC Lab Results  Component Value Date   WBC 11.3 (H) 06/01/2017   RBC 3.76 (L) 06/01/2017   HGB 12.3 (L) 06/01/2017   HCT 36.9 (L) 06/01/2017   PLT 220 06/01/2017   MCV 98.1 06/01/2017   MCH 32.7 06/01/2017   MCHC 33.3 06/01/2017   RDW 13.5 06/01/2017   LYMPHSABS 1,122 11/30/2016   MONOABS 0.9 04/23/2016   EOSABS 122 11/30/2016    BMET Lab Results  Component Value Date   NA 139 06/01/2017   K 4.3 06/01/2017   CL 107 06/01/2017   CO2 21 (L) 06/01/2017   GLUCOSE 156 (H) 06/01/2017   BUN 25 (H) 06/01/2017   CREATININE 1.19 06/01/2017   CALCIUM 9.8 06/01/2017   GFRNONAA >60 06/01/2017   GFRAA >60 06/01/2017      Assessment and Plan  hiv disease = cd 4 count baseline closer to 100. It is lower in the setting of acute respiratory infection. Will continue on oi proph of bactrim ds daily after he finishes his abtx course for pneumonia. Will recheck his labs next week to see that he has recovered and decide if need to start weekly azithromycin  Bronchiectasis and emphysema = now being followed also in pulmonary clinic. Has upcoming chest CT as follow up to recent admisison.  Genital warts = - needs mupirocin refill - [facility] on [facility] genital warts

## 2017-06-10 LAB — LEGIONELLA PNEUMOPHILA/CULTURE: Legionella Pneumophila DFA: NEGATIVE

## 2017-06-10 LAB — LEGIONELLA CULTURE REFLEX

## 2017-06-14 ENCOUNTER — Other Ambulatory Visit: Payer: Self-pay | Admitting: Internal Medicine

## 2017-07-08 ENCOUNTER — Ambulatory Visit (INDEPENDENT_AMBULATORY_CARE_PROVIDER_SITE_OTHER): Payer: Commercial Managed Care - PPO | Admitting: Family

## 2017-07-08 ENCOUNTER — Encounter: Payer: Self-pay | Admitting: *Deleted

## 2017-07-08 ENCOUNTER — Encounter: Payer: Self-pay | Admitting: Family

## 2017-07-08 ENCOUNTER — Telehealth: Payer: Self-pay | Admitting: *Deleted

## 2017-07-08 DIAGNOSIS — A630 Anogenital (venereal) warts: Secondary | ICD-10-CM

## 2017-07-08 NOTE — Progress Notes (Signed)
[name] returns today to update pictures prior to his appointment with plastic surgery per their request.

## 2017-07-09 NOTE — Telephone Encounter (Signed)
Patient walked in to [facility], stated he was having difficulty getting in to [facility] for [facility] referral.  He states his warts feel a bit better with the mupirocin ointment and would like a refill of that (available at [facility] RN utilized interpreter Imran to address patient's needs.  RN [facility], spoke with Rexene AlbertsBridgett, got an appointment for Monday 4/30 at 11:45 with Dr Earle GellMalcolm Marks. Bridgett faxed over appointment information with address/phone number - RN gave to patient. RN placed referral order, faxed notes/pictures to [phone]. He accepted appointment, asked for FMLA paperwork to be filled out for his son to allow him to get back and forth to his appointments.  Tammy SoursGreg saw patient, obtained current pictures for patient's referral, filled out paperwork (scanned to patient's chart).   Patient given dressing supplies and groceries. He knows to bring his insurance card and photo ID. All questions answered, patient stated he was relieved he would get help. Andree CossHowell, Benjimen Kelley M, RN

## 2017-07-09 NOTE — Telephone Encounter (Signed)
Appointment is actually TUESDAY, April 30th 11:45.  RN called patient to let him know, but voicemail is not yet set up. Will try again.

## 2017-07-13 DIAGNOSIS — A630 Anogenital (venereal) warts: Secondary | ICD-10-CM | POA: Insufficient documentation

## 2017-07-13 NOTE — Telephone Encounter (Signed)
RN called patient multiple times Friday - Tuesday, was unable to reach him. RN called [employer] to see if he kept his appointment, but was not able to reach anyone (too close to 5:00).   [phone]

## 2017-07-14 ENCOUNTER — Encounter: Payer: Self-pay | Admitting: *Deleted

## 2017-07-14 NOTE — Telephone Encounter (Signed)
Received notes from [facility], placed in Dr Marriott box. They will refer him to dermatology.

## 2017-07-23 ENCOUNTER — Inpatient Hospital Stay: Admission: RE | Admit: 2017-07-23 | Payer: Commercial Managed Care - PPO | Source: Ambulatory Visit

## 2017-07-27 ENCOUNTER — Inpatient Hospital Stay: Payer: Commercial Managed Care - PPO | Admitting: Internal Medicine

## 2017-08-16 ENCOUNTER — Ambulatory Visit: Payer: Commercial Managed Care - PPO | Admitting: Internal Medicine

## 2017-10-11 ENCOUNTER — Encounter: Payer: Self-pay | Admitting: Internal Medicine

## 2017-10-11 ENCOUNTER — Ambulatory Visit (INDEPENDENT_AMBULATORY_CARE_PROVIDER_SITE_OTHER): Payer: Commercial Managed Care - PPO | Admitting: Internal Medicine

## 2017-10-11 VITALS — BP 124/85 | HR 98 | Temp 98.0°F | Wt 186.0 lb

## 2017-10-11 DIAGNOSIS — B20 Human immunodeficiency virus [HIV] disease: Secondary | ICD-10-CM | POA: Diagnosis not present

## 2017-10-11 DIAGNOSIS — A630 Anogenital (venereal) warts: Secondary | ICD-10-CM | POA: Diagnosis not present

## 2017-10-11 LAB — COMPLETE METABOLIC PANEL WITH GFR
AG RATIO: 1 (calc) (ref 1.0–2.5)
ALBUMIN MSPROF: 4.2 g/dL (ref 3.6–5.1)
ALKALINE PHOSPHATASE (APISO): 74 U/L (ref 40–115)
ALT: 11 U/L (ref 9–46)
AST: 16 U/L (ref 10–40)
BILIRUBIN TOTAL: 0.5 mg/dL (ref 0.2–1.2)
BUN: 19 mg/dL (ref 7–25)
CHLORIDE: 100 mmol/L (ref 98–110)
CO2: 29 mmol/L (ref 20–32)
Calcium: 9.6 mg/dL (ref 8.6–10.3)
Creat: 1.12 mg/dL (ref 0.60–1.35)
GFR, EST AFRICAN AMERICAN: 91 mL/min/{1.73_m2} (ref >=60)
GFR, Est Non African American: 78 mL/min/{1.73_m2} (ref >=60)
GLOBULIN: 4.2 g/dL — AB (ref 1.9–3.7)
GLUCOSE: 113 mg/dL — AB (ref 65–99)
POTASSIUM: 4.6 mmol/L (ref 3.5–5.3)
SODIUM: 135 mmol/L (ref 135–146)
TOTAL PROTEIN: 8.4 g/dL — AB (ref 6.1–8.1)

## 2017-10-11 LAB — CBC WITH DIFFERENTIAL/PLATELET
BASOS PCT: 0.7 %
Basophils Absolute: 29 cells/uL (ref 0–200)
Eosinophils Absolute: 316 cells/uL (ref 15–500)
Eosinophils Relative: 7.7 %
HCT: 39.9 % (ref 38.5–50.0)
HEMOGLOBIN: 13.8 g/dL (ref 13.2–17.1)
Lymphs Abs: 1681 cells/uL (ref 850–3900)
MCH: 32.5 pg (ref 27.0–33.0)
MCHC: 34.6 g/dL (ref 32.0–36.0)
MCV: 94.1 fL (ref 80.0–100.0)
MONOS PCT: 12.6 %
MPV: 10 fL (ref 7.5–12.5)
Neutro Abs: 1558 cells/uL (ref 1500–7800)
Neutrophils Relative %: 38 %
Platelets: 185 10*3/uL (ref 140–400)
RBC: 4.24 10*6/uL (ref 4.20–5.80)
RDW: 12.4 % (ref 11.0–15.0)
Total Lymphocyte: 41 %
WBC mixed population: 517 cells/uL (ref 200–950)
WBC: 4.1 10*3/uL (ref 3.8–10.8)

## 2017-10-11 NOTE — Progress Notes (Signed)
RFV: follow up with hiv disease  Patient ID: [name], male   DOB: 23-Oct-1970, 47 y.o.   MRN: [id number]  HPI [name] is a 47yo M with hiv disease, CD 4 count of 30/VL 14(march 2019) currently on tivicay/descovy/prezcobix and bactrim OI proph. He states taht he has been taking his medications regularly. He is noticing less drainage to his inguinal region affected by genital warts. Please see photos from [name]'s visit in April. He denies any purulent discharge. Not significantly painful. He was evaluated by local urology and general surgery who recommended him to be seen by [facility]-disciplinary team at [facility]. He has upcoming appts at [name] but did not know when/where/who is seeing.  Outpatient Encounter Medications as of 10/11/2017  Medication Sig  . BACLOFEN PO Take 1 tablet by mouth daily as needed (pain).  Marland Kitchen darunavir-cobicistat (PREZCOBIX) 800-150 MG tablet Take 1 tablet by mouth daily with breakfast.  . dolutegravir (TIVICAY) 50 MG tablet Take 1 tablet (50 mg total) by mouth daily.  . feeding supplement (BOOST / RESOURCE BREEZE) LIQD Take 1 Container by mouth 3 (three) times daily between meals.  Marland Kitchen levofloxacin (LEVAQUIN) 750 MG tablet Take 1 tablet (750 mg total) by mouth daily. Starting 3/21  . lidocaine (LMX) 4 % cream Apply 1 application topically 3 (three) times daily as needed. Apply to genital sores for pain relief as needed  . mupirocin cream (BACTROBAN) 2 % Apply 1 application topically 2 (two) times daily. Apply to open/sore areas to help with pain and prevent infection  . ondansetron (ZOFRAN) 4 MG tablet Take 1 tablet (4 mg total) by mouth every 8 (eight) hours as needed. kichefuchefu (nausea in swahili)  . predniSONE (DELTASONE) 50 MG tablet Take 1 tablet (50 mg total) by mouth daily with breakfast. starting 3/21  . sulfamethoxazole-trimethoprim (BACTRIM DS,SEPTRA DS) 800-160 MG tablet TAKE 1 TABLET BY MOUTH ONCE DAILY  . traMADol (ULTRAM) 50 MG tablet  Take 50 mg by mouth every 4 (four) hours as needed for pain.   No facility-administered encounter medications on file as of 10/11/2017.      Patient Active Problem List   Diagnosis Date Noted  . Acute bronchiolitis due to respiratory syncytial virus (RSV)   . Bronchiectasis with acute lower respiratory infection ([name])   . Shortness of breath 05/31/2017  . Pulmonary emphysema ([name])   . 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 Chronic systolic CHF (congestive heart failure) ([name]) 04/24/2016  . AKI (acute kidney injury) ([name]) 04/24/2016  . Flu 04/24/2016  . 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 Hypoxia   . Acute respiratory failure with hypoxia ([name]) 02/25/2016  . Normochromic normocytic anemia 02/25/2016  . Pneumonia 02/25/2016  . Protein-calorie malnutrition, severe 02/25/2016  . PE (pulmonary thromboembolism) ([name]) 02/24/2016  . Renal insufficiency   . Surgery, elective   . AIDS (acquired immune deficiency syndrome) ([name])   . S/P ORIF (open reduction internal fixation) fracture 03/21/2014  . Low back pain 10/10/2013  . Multifocal pneumonia 09/27/2013  . HIV (human immunodeficiency virus infection) ([name]) 09/18/2013  . Refugee health examination 09/18/2013  . Cough 09/18/2013     Health Maintenance Due  Topic Date Due  . TETANUS/TDAP  03/16/1990    Sochx: not smoking, not sexually active Review of Systems + rash- groin region. Otherwise 12 point ros is otherwise negative Physical Exam   BP 124/85   Pulse 98   Temp  98 F (36.7 C)   Wt 186 lb (84.4 kg)   BMI 25.23 kg/m   Physical Exam  Constitutional: He is oriented to person, place, and time. He appears well-developed and well-nourished. No distress.  HENT:  Mouth/Throat: Oropharynx is clear and moist. No oropharyngeal exudate.  Cardiovascular: Normal rate, regular rhythm and normal heart sounds. Exam reveals no gallop and no friction rub.  No  murmur heard.  Pulmonary/Chest: Effort normal and breath sounds normal. No respiratory distress. He has no wheezes.  Abdominal: Soft. Bowel sounds are normal. He exhibits no distension. There is no tenderness.  Lymphadenopathy:  He has no cervical adenopathy.  Neurological: He is alert and oriented to person, place, and time.  Skin: Skin is warm and dry. No rash noted. No erythema.  Psychiatric: He has a normal mood and affect. His behavior is normal.    Lab Results  Component Value Date   CD4TCELL 5 (L) 05/31/2017   Lab Results  Component Value Date   CD4TABS 14 (L) 05/31/2017   CD4TABS 100 (L) 04/15/2017   CD4TABS 90 (L) 03/29/2017   Lab Results  Component Value Date   HIV1RNAQUANT 30 05/31/2017   No results found for: HEPBSAB Lab Results  Component Value Date   LABRPR NON-REACTIVE 11/30/2016    CBC Lab Results  Component Value Date   WBC 11.3 (H) 06/01/2017   RBC 3.76 (L) 06/01/2017   HGB 12.3 (L) 06/01/2017   HCT 36.9 (L) 06/01/2017   PLT 220 06/01/2017   MCV 98.1 06/01/2017   MCH 32.7 06/01/2017   MCHC 33.3 06/01/2017   RDW 13.5 06/01/2017   LYMPHSABS 1,122 11/30/2016   MONOABS 0.9 04/23/2016   EOSABS 122 11/30/2016    BMET Lab Results  Component Value Date   NA 139 06/01/2017   K 4.3 06/01/2017   CL 107 06/01/2017   CO2 21 (L) 06/01/2017   GLUCOSE 156 (H) 06/01/2017   BUN 25 (H) 06/01/2017   CREATININE 1.19 06/01/2017   CALCIUM 9.8 06/01/2017   GFRNONAA >60 06/01/2017   GFRAA >60 06/01/2017    Assessment and Plan  HIV disease = will check that he is also getting descovy as part of his regimen. It should be tivicay,prezcobix, descovy. Will recheck his CD 4 count to ensure > 50. As well as VL to see that he is undetectable  CD <200 - will continue on bactrim oi proph  Genital warts = [facility] and given him the directions, address, phone numbers for him to see dr [name] (Derm) then urology then general surgery to decide on  candidate for surgical debridement/treatment course  Discussed the importance of adherence to medication - this was done using swahili interpreter  Spent 40 min with patient with greater than 50% discussing importance of attending his upcoming appt., and hiv adherence

## 2017-10-12 LAB — T-HELPER CELL (CD4) - (RCID CLINIC ONLY)
CD4 T CELL ABS: 110 /uL — AB (ref 400–2700)
CD4 T CELL HELPER: 7 % — AB (ref 33–55)

## 2017-10-13 LAB — HIV-1 RNA QUANT-NO REFLEX-BLD
HIV 1 RNA Quant: 37 copies/mL — ABNORMAL HIGH
HIV-1 RNA Quant, Log: 1.57 Log copies/mL — ABNORMAL HIGH

## 2017-10-15 MED ORDER — EMTRICITABINE-TENOFOVIR AF 200-25 MG PO TABS: 1.0000 | ORAL_TABLET | Freq: Every day | ORAL | 11 refills | Status: DC

## 2017-10-21 ENCOUNTER — Other Ambulatory Visit: Payer: Self-pay | Admitting: Pharmacist

## 2017-10-21 DIAGNOSIS — B20 Human immunodeficiency virus [HIV] disease: Secondary | ICD-10-CM

## 2017-11-21 ENCOUNTER — Encounter (HOSPITAL_COMMUNITY): Payer: Self-pay | Admitting: Emergency Medicine

## 2017-11-21 ENCOUNTER — Emergency Department (HOSPITAL_COMMUNITY)
Admission: EM | Admit: 2017-11-21 | Discharge: 2017-11-21 | Disposition: A | Discharge status: Home / Self Care | Payer: Commercial Managed Care - PPO | Attending: Emergency Medicine | Admitting: Emergency Medicine

## 2017-11-21 ENCOUNTER — Emergency Department (HOSPITAL_COMMUNITY): Payer: Commercial Managed Care - PPO

## 2017-11-21 ENCOUNTER — Other Ambulatory Visit: Payer: Self-pay

## 2017-11-21 DIAGNOSIS — R05 Cough: Secondary | ICD-10-CM | POA: Diagnosis not present

## 2017-11-21 DIAGNOSIS — I5022 Chronic systolic (congestive) heart failure: Secondary | ICD-10-CM | POA: Diagnosis not present

## 2017-11-21 DIAGNOSIS — F17210 Nicotine dependence, cigarettes, uncomplicated: Secondary | ICD-10-CM | POA: Diagnosis not present

## 2017-11-21 DIAGNOSIS — Z79899 Other long term (current) drug therapy: Secondary | ICD-10-CM | POA: Diagnosis not present

## 2017-11-21 DIAGNOSIS — Z21 Asymptomatic human immunodeficiency virus [HIV] infection status: Secondary | ICD-10-CM | POA: Insufficient documentation

## 2017-11-21 DIAGNOSIS — R066 Hiccough: Secondary | ICD-10-CM | POA: Diagnosis present

## 2017-11-21 MED ORDER — CHLORPROMAZINE HCL 10 MG PO TABS
10.0000 mg | ORAL_TABLET | Freq: Once | ORAL | Status: AC
  Administered 2017-11-21: 10 mg via ORAL
  Filled 2017-11-21 (×2): qty 1

## 2017-11-21 MED ORDER — CHLORPROMAZINE HCL 10 MG PO TABS: 10.0000 mg | ORAL_TABLET | Freq: Three times a day (TID) | ORAL | 0 refills | Status: DC

## 2017-11-21 NOTE — ED Notes (Signed)
Hiccoughs for 4 days  No pain  No previous history  He has lived in [place] for 4 years  He does not have a local doctor.  No history of kidney disease.  He has vomited once

## 2017-11-21 NOTE — Discharge Instructions (Signed)
Your chest x-ray and EKG were normal today.  I have prescribed you medication, Thorazine, to take 3 times a day. Hopefully, this will help with your hiccups.  Please follow-up with your PCP, Dr. Gwendolyn Grant, in 2-4 weeks to discuss this if you continue to have hiccups. You can also try to follow-up with the [facility] if you are unable to be seen by Dr. Gwendolyn Grant.

## 2017-11-21 NOTE — ED Notes (Signed)
Med requested from pharmacy.

## 2017-11-21 NOTE — ED Triage Notes (Addendum)
Pt speaks swahilli. Attempted to use swahilli tranaslator but one was not available via stratus. Pt reports he has had the hiccups for 3 days now, states no problems eating though. He states he has no pain anywhere.

## 2017-11-21 NOTE — ED Provider Notes (Signed)
[facility] EMERGENCY DEPARTMENT Provider Note  CSN: [id number] Arrival date & time: 11/21/17  1730  History   Chief Complaint Chief Complaint  Patient presents with  . Hiccups   HPI  A medical interpreter was used for this interview.  [name] is a 47 y.o. male with a medical history of hepatitis, TB, AIDS and PE who presented to the ED for hiccups x3 days. He describes an acute onset with no precipitating event. Patient states that hiccupping has been constant since onset with no relieving or worsening factors. He has not attempted any intervention. Denies recent head trauma or injuries. Denies fever, chills, abdominal pain, heartburn, N/V, chest pain, SOB, cough, palpitations, dizziness or lightheadedness. He states he has not had anything like this happen before.  Past Medical History:  Diagnosis Date  . Depression    "stress and depression for any man is common" (03/21/2014)  . Genital warts 01/04/2017  . Hepatitis    "Parsons don't know what hepatitis Parsons have"  . HIV disease ([name])   . TB (pulmonary tuberculosis)    previously treated according to refugee documentation    Patient Active Problem List   Diagnosis Date Noted  . Acute bronchiolitis due to respiratory syncytial virus (RSV)   . Bronchiectasis with acute lower respiratory infection ([name])   . Shortness of breath 05/31/2017  . Pulmonary emphysema ([name])   . 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 Chronic systolic CHF (congestive heart failure) ([name]) 04/24/2016  . AKI (acute kidney injury) ([name]) 04/24/2016  . Flu 04/24/2016  . 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 Hypoxia   . Acute respiratory failure with hypoxia ([name]) 02/25/2016  . Normochromic normocytic anemia 02/25/2016  . Pneumonia 02/25/2016  . Protein-calorie malnutrition, severe 02/25/2016  . PE (pulmonary thromboembolism) ([name]) 02/24/2016  . Renal  insufficiency   . Surgery, elective   . AIDS (acquired immune deficiency syndrome) ([name])   . S/P ORIF (open reduction internal fixation) fracture 03/21/2014  . Low back pain 10/10/2013  . Multifocal pneumonia 09/27/2013  . HIV (human immunodeficiency virus infection) ([name]) 09/18/2013  . Refugee health examination 09/18/2013  . Cough 09/18/2013    Past Surgical History:  Procedure Laterality Date  . FRACTURE SURGERY    . IM NAILING TIBIA Right 03/21/2014  . ORIF ANKLE FRACTURE Right 03/21/2014   lateral malleolus/notes 03/21/2014  . ORIF ANKLE FRACTURE Right 03/21/2014   Procedure: OPEN REDUCTION INTERNAL FIXATION (ORIF) pilon ;  Surgeon: Eldred Manges, MD;  Location: [facility] OR;  Service: Orthopedics;  Laterality: Right;  . TIBIA IM NAIL INSERTION Right 03/21/2014   Procedure: INTRAMEDULLARY (IM) NAIL TIBIAL;  Surgeon: Eldred Manges, MD;  Location: [facility] OR;  Service: Orthopedics;  Laterality: Right;  Marland Kitchen VIDEO BRONCHOSCOPY Bilateral 03/02/2016   Procedure: VIDEO BRONCHOSCOPY WITHOUT FLUORO;  Surgeon: Roslynn Amble, MD;  Location: [facility] ENDOSCOPY;  Service: Cardiopulmonary;  Laterality: Bilateral;        Home Medications    Prior to Admission medications   Medication Sig Start Date End Date Taking? Authorizing Provider  BACLOFEN PO Take 1 tablet by mouth daily as needed (pain).    [provider]  chlorproMAZINE (THORAZINE) 10 MG tablet Take 1 tablet (10 mg total) by mouth 3 (three) times daily for 14 days. 11/21/17 12/05/17  [name], Anthony Ivory I, PA-C  emtricitabine-tenofovir AF (DESCOVY) 200-25 MG tablet Take 1 tablet by mouth daily. 10/15/17  Judyann Munson, MD  feeding supplement (BOOST / RESOURCE BREEZE) LIQD Take 1 Container by mouth 3 (three) times daily between meals. 04/24/16   Leroy Sea, MD  levofloxacin (LEVAQUIN) 750 MG tablet Take 1 tablet (750 mg total) by mouth daily. Starting 3/21 06/02/17   Marthenia Rolling, DO  lidocaine (LMX) 4 % cream Apply 1 application topically 3 (three)  times daily as needed. Apply to genital sores for pain relief as needed 05/11/17   Veryl Speak, FNP  mupirocin cream (BACTROBAN) 2 % Apply 1 application topically 2 (two) times daily. Apply to open/sore areas to help with pain and prevent infection 06/07/17   Judyann Munson, MD  ondansetron (ZOFRAN) 4 MG tablet Take 1 tablet (4 mg total) by mouth every 8 (eight) hours as needed. kichefuchefu (nausea in swahili) 12/03/16   Judyann Munson, MD  predniSONE (DELTASONE) 50 MG tablet Take 1 tablet (50 mg total) by mouth daily with breakfast. starting 3/21 06/03/17   Marthenia Rolling, DO  PREZCOBIX 800-150 MG tablet TAKE 1 TABLET BY MOUTH DAILY WITH BREAKFAST. 10/21/17   Judyann Munson, MD  sulfamethoxazole-trimethoprim (BACTRIM DS,SEPTRA DS) 800-160 MG tablet TAKE 1 TABLET BY MOUTH ONCE DAILY 06/14/17   Judyann Munson, MD  TIVICAY 50 MG tablet TAKE 1 TABLET (50 MG TOTAL) BY MOUTH DAILY. 10/21/17   Judyann Munson, MD  traMADol (ULTRAM) 50 MG tablet Take 50 mg by mouth every 4 (four) hours as needed for pain. 04/22/17   [provider]    Family History Family History  Problem Relation Age of Onset  . Hypertension Other   . Heart disease Sister     Social History Social History   Tobacco Use  . Smoking status: Former Smoker    Packs/day: 0.50    Years: 6.00    Pack years: 3.00    Types: Cigarettes    Last attempt to quit: 03/16/2001    Years since quitting: 16.6  . Smokeless tobacco: Never Used  . Tobacco comment: "quit smoking ~ 2003"  Substance Use Topics  . Alcohol use: Yes    Comment: drinks bottled beer intermittently  . Drug use: No   Allergies   Patient has no known allergies.   Review of Systems Review of Systems  Constitutional: Negative.   HENT: Negative.   Respiratory: Negative for cough, chest tightness and shortness of breath.        Hiccups  Cardiovascular: Negative for chest pain and leg swelling.  Gastrointestinal: Negative for abdominal distention, abdominal  pain, nausea and vomiting.  Neurological: Negative for dizziness, facial asymmetry, speech difficulty, weakness, light-headedness, numbness and headaches.  Psychiatric/Behavioral: Negative.    Physical Exam Updated Vital Signs BP 107/79   Pulse 99   Temp 99.1 F (37.3 C) (Oral)   Resp 20   SpO2 93%   Physical Exam  Constitutional: Vital signs are normal. He is cooperative.  Thin. Hiccups every 4-5 seconds.  HENT:  Mouth/Throat: Uvula is midline, oropharynx is clear and moist and mucous membranes are normal.  Eyes: Pupils are equal, round, and reactive to light. Conjunctivae, EOM and lids are normal.  Neck: Trachea normal, normal range of motion, full passive range of motion without pain and phonation normal. Neck supple.  Cardiovascular: Normal rate, regular rhythm, normal heart sounds, intact distal pulses and normal pulses.  Pulmonary/Chest: Effort normal and breath sounds normal. He exhibits no tenderness.  Abdominal: Soft. Normal appearance and bowel sounds are normal. He exhibits no distension. There is no tenderness.  Neurological: He is alert.  Skin: Skin is warm and intact. Capillary refill takes less than 2 seconds. No rash noted.  Nursing note and vitals reviewed.  ED Treatments / Results  Labs (all labs ordered are listed, but only abnormal results are displayed) Labs Reviewed - No data to display  EKG None  Radiology Dg Chest 2 View  Result Date: 11/21/2017 CLINICAL DATA:  Hiccoughs for the past 3 days. EXAM: CHEST - 2 VIEW COMPARISON:  Portable chest and chest CTA dated 05/31/2017. FINDINGS: Normal sized heart. Stable bilateral bullous changes and interstitial fibrosis. No acute bony abnormality. IMPRESSION: 1. No acute abnormality. 2. 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 Electronically Signed   By: Beckie Salts M.D.   On: 11/21/2017 19:20    Procedures Procedures (including critical care time)  Medications Ordered in ED Medications    chlorproMAZINE (THORAZINE) tablet 10 mg (has no administration in time range)     Initial Impression / Assessment and Plan / ED Course  Triage vital signs and the nursing notes have been reviewed.  Pertinent labs & imaging results that were available during care of the patient were reviewed and considered in medical decision making (see chart for details).  Patient presents with 3-day history of hiccups. He reports that hiccups have been constant during this time frame without any change in quality or frequency. Patient denies new medications, exposures or recent surgeries. He also denies any other complaints that would suggest a neurological, GI, pulmonary or cardiac etiology. Patient is not in distress by hiccups and is hemodynamically stable and has no respiratory compromise.  Clinical Course as of Nov 21 2029  Wynelle Link Nov 21, 2017  1958 CXR and EKG unremarkable. Discussed case with Dr. Eber Hong. Will discharge with Thorazine 10mg  TID and encouraged to follow-up with PCP.   [GM]    Clinical Course User Index [GM] [name], Sharyon Medicus, PA-C    Final Clinical Impressions(s) / ED Diagnoses  1. Hiccups. Rx for Thorazine 10 mg TID. Advised to follow-up with PCP.  Dispo: Home. After thorough clinical evaluation, this patient is determined to be medically stable and can be safely discharged with the previously mentioned treatment and/or outpatient follow-up/referral(s). At this time, there are no other apparent medical conditions that require further screening, evaluation or treatment.   Final diagnoses:  Hiccups    ED Discharge Orders         Ordered    chlorproMAZINE (THORAZINE) 10 MG tablet  3 times daily     11/21/17 2014            Reva Bores 11/21/17 2031    Eber Hong, MD 11/22/17 [phone]

## 2018-02-01 ENCOUNTER — Other Ambulatory Visit: Payer: Self-pay | Admitting: Pharmacist

## 2018-02-01 DIAGNOSIS — B20 Human immunodeficiency virus [HIV] disease: Secondary | ICD-10-CM

## 2018-02-01 MED ORDER — DOLUTEGRAVIR SODIUM 50 MG PO TABS: 50.0000 mg | ORAL_TABLET | Freq: Every day | ORAL | 1 refills | Status: DC

## 2018-02-01 MED ORDER — DARUNAVIR-COBICISTAT 800-150 MG PO TABS: 1.0000 | ORAL_TABLET | Freq: Every day | ORAL | 1 refills | Status: DC

## 2018-02-01 NOTE — Progress Notes (Signed)
Patient traveling out of the country.  Sent in a 90 day supply of his Prezcobix and Tivicay to see if his insurance will pay for it.

## 2018-02-02 IMAGING — DX DG CHEST 2V
2 series · 2 of 2 positions shown · non-contrast
Comparison: CT chest February 24, 2016

CLINICAL DATA: Gradually worsening cough for 3 days. History of HIV
and tuberculosis, pulmonary embolism.

EXAM:
CHEST  2 VIEW

[w chest pa]
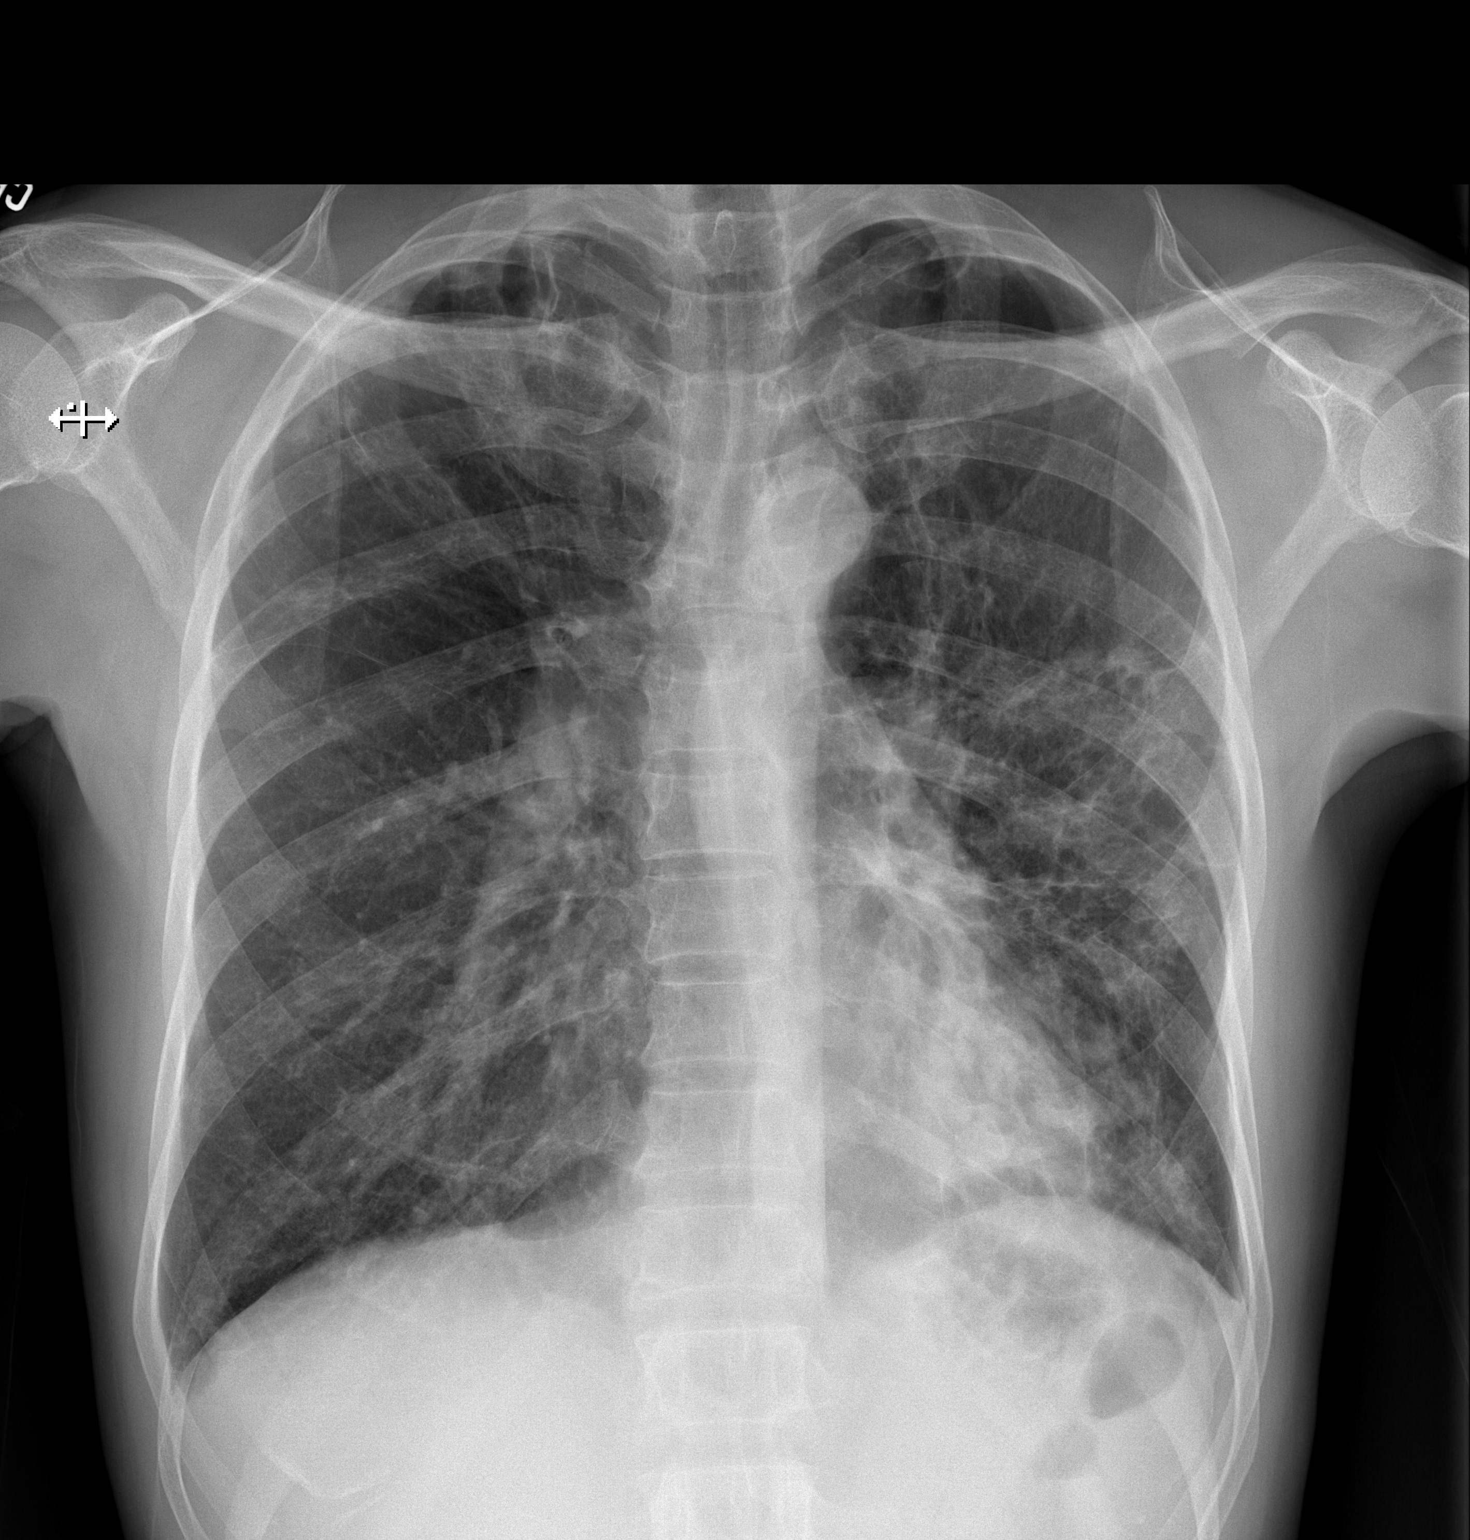

[w chest lat]
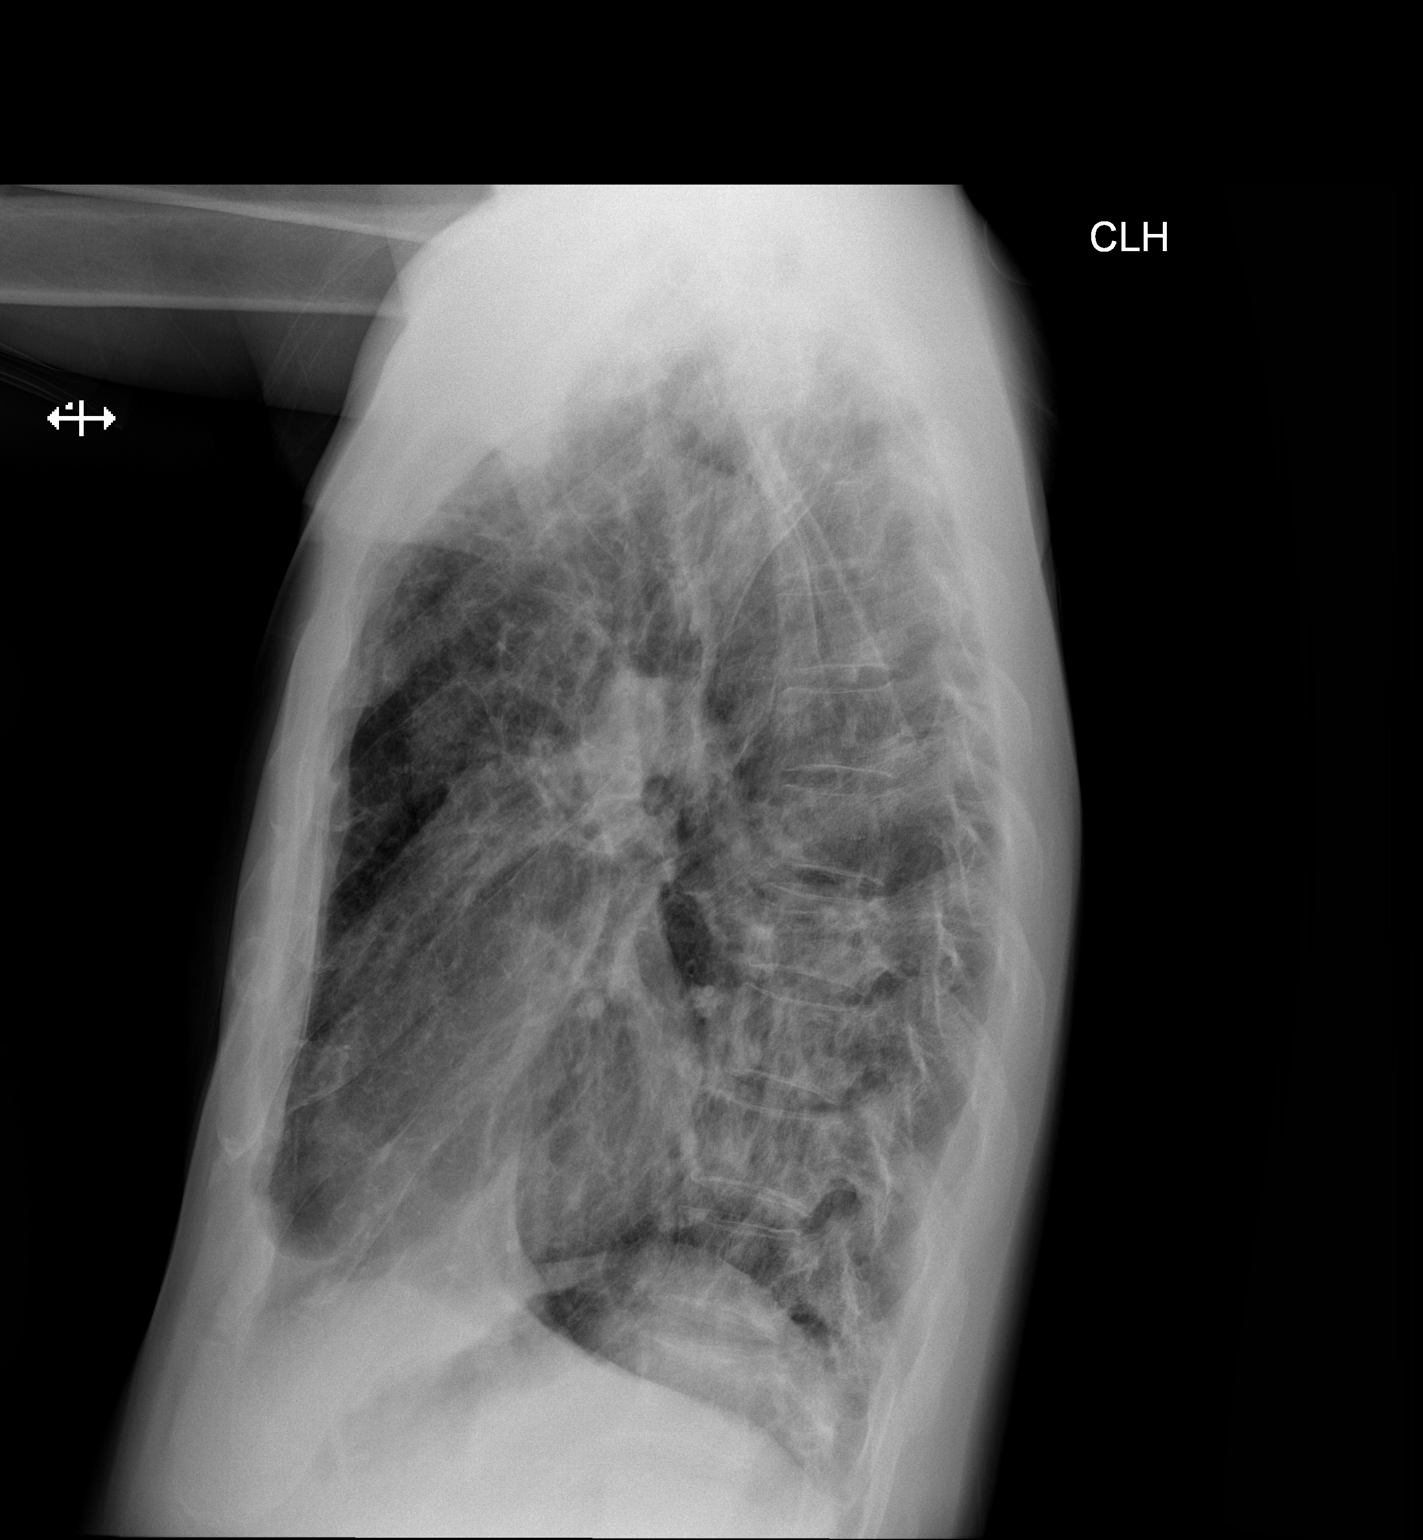

[2 of 2 positions shown; findings below may reference images not displayed]

FINDINGS: Cardiomediastinal silhouette is normal. Cystic changes LEFT lung
corresponding to bronchiectasis. RIGHT upper lobe scarring. No
pleural effusion. No pneumothorax. Soft tissue planes included
osseous structures are normal.
IMPRESSION: Multifocal scarring and bronchiectasis without definite acute
cardiopulmonary process though limited assessment given degree of
baseline parenchymal abnormality.

## 2018-02-14 ENCOUNTER — Ambulatory Visit: Payer: Commercial Managed Care - PPO | Admitting: Internal Medicine

## 2018-04-13 ENCOUNTER — Ambulatory Visit: Payer: Commercial Managed Care - PPO | Admitting: Internal Medicine

## 2018-04-18 ENCOUNTER — Telehealth: Payer: Self-pay | Admitting: Pharmacy Technician

## 2018-04-18 NOTE — Telephone Encounter (Addendum)
[facility] Patient Advocate Encounter   I was successful in obtaining a copay card for Tivicay through [employer].  This copay card will make the patient's copay $0.  Call Center has spoken with the patient about mailing medication.    The billing information is as follows and has been shared with [facility].   Member ID: [id number] RxBin: [id number] PCN: [id number] Group: [id number]  [facility] Patient Advocate Encounter   I was successful in securing patient a $7500 grant from [employer] ([employer]) to provide copayment coverage for Prezcobix, this will also provide coverage for Symtuza which will start at the end of March. This will make the out of pocket cost $0.     RxBin:[id number]  PCN: [id number] Member ID: [id number] Group ID: [id number] Dates of Eligibility: 04/18/2018 through 04/19/2019 Current balance $5441.00  Beulah Gandy, CPhT Specialty Pharmacy Patient [facility] Phone: [phone] Fax: [phone] 04/18/2018 2:03 PM

## 2018-04-19 ENCOUNTER — Telehealth: Payer: Self-pay | Admitting: *Deleted

## 2018-04-19 NOTE — Telephone Encounter (Signed)
Patient walked in, states he has an appointment 05/06/2018 with [facility] General Surgery. He will need his son to take him, but his son needs FMLA paper work updated.  [name] would like the paperwork faxed to [employer] first, then please call him to let him know if has been sent and he will pick up the original. He is aware this may take a week. Copy made, placed in triage.  Original, plus previous FMLA, placed in Dr Marriott box. Andree Coss, RN

## 2018-04-25 ENCOUNTER — Emergency Department (HOSPITAL_COMMUNITY)
Admission: EM | Admit: 2018-04-25 | Discharge: 2018-04-26 | Disposition: A | Discharge status: Home / Self Care | Payer: Commercial Managed Care - PPO | Attending: Internal Medicine | Admitting: Internal Medicine

## 2018-04-25 ENCOUNTER — Encounter (HOSPITAL_COMMUNITY): Payer: Self-pay

## 2018-04-25 DIAGNOSIS — Z79899 Other long term (current) drug therapy: Secondary | ICD-10-CM | POA: Diagnosis not present

## 2018-04-25 DIAGNOSIS — B20 Human immunodeficiency virus [HIV] disease: Secondary | ICD-10-CM | POA: Insufficient documentation

## 2018-04-25 DIAGNOSIS — Z630 Problems in relationship with spouse or partner: Secondary | ICD-10-CM | POA: Insufficient documentation

## 2018-04-25 DIAGNOSIS — R1030 Lower abdominal pain, unspecified: Secondary | ICD-10-CM | POA: Insufficient documentation

## 2018-04-25 DIAGNOSIS — Z5321 Procedure and treatment not carried out due to patient leaving prior to being seen by health care provider: Secondary | ICD-10-CM | POA: Diagnosis not present

## 2018-04-25 DIAGNOSIS — I5022 Chronic systolic (congestive) heart failure: Secondary | ICD-10-CM | POA: Diagnosis not present

## 2018-04-25 DIAGNOSIS — Z87891 Personal history of nicotine dependence: Secondary | ICD-10-CM | POA: Insufficient documentation

## 2018-04-25 NOTE — ED Triage Notes (Signed)
Patient complains of bilateral flank pain x 8 months. States that the pain is worse and causing him to awake from sleep. Denies injury

## 2018-04-25 NOTE — ED Notes (Signed)
Pt continuously asking staff when he will go to a room, talking on his cell phone loudly asking to know which patients are in front of him, pt informed that due to privacy he is not allowed to know patient names.

## 2018-04-26 ENCOUNTER — Encounter (HOSPITAL_COMMUNITY): Payer: Self-pay

## 2018-04-26 ENCOUNTER — Emergency Department (HOSPITAL_COMMUNITY)
Admission: EM | Admit: 2018-04-26 | Discharge: 2018-04-26 | Disposition: A | Discharge status: Home / Self Care | Payer: Commercial Managed Care - PPO | Source: Home / Self Care | Attending: Emergency Medicine | Admitting: Emergency Medicine

## 2018-04-26 ENCOUNTER — Emergency Department (HOSPITAL_COMMUNITY)
Admission: EM | Admit: 2018-04-26 | Discharge: 2018-04-26 | Discharge status: Absent without leave | Payer: Commercial Managed Care - PPO

## 2018-04-26 DIAGNOSIS — A630 Anogenital (venereal) warts: Secondary | ICD-10-CM | POA: Insufficient documentation

## 2018-04-26 DIAGNOSIS — I5022 Chronic systolic (congestive) heart failure: Secondary | ICD-10-CM | POA: Insufficient documentation

## 2018-04-26 DIAGNOSIS — Z630 Problems in relationship with spouse or partner: Secondary | ICD-10-CM | POA: Diagnosis not present

## 2018-04-26 DIAGNOSIS — B20 Human immunodeficiency virus [HIV] disease: Secondary | ICD-10-CM | POA: Insufficient documentation

## 2018-04-26 DIAGNOSIS — R319 Hematuria, unspecified: Secondary | ICD-10-CM | POA: Diagnosis not present

## 2018-04-26 DIAGNOSIS — R1030 Lower abdominal pain, unspecified: Secondary | ICD-10-CM | POA: Diagnosis not present

## 2018-04-26 DIAGNOSIS — R300 Dysuria: Secondary | ICD-10-CM | POA: Diagnosis not present

## 2018-04-26 DIAGNOSIS — Z79899 Other long term (current) drug therapy: Secondary | ICD-10-CM

## 2018-04-26 DIAGNOSIS — Z5321 Procedure and treatment not carried out due to patient leaving prior to being seen by health care provider: Secondary | ICD-10-CM | POA: Diagnosis not present

## 2018-04-26 DIAGNOSIS — Z87891 Personal history of nicotine dependence: Secondary | ICD-10-CM | POA: Insufficient documentation

## 2018-04-26 DIAGNOSIS — R52 Pain, unspecified: Secondary | ICD-10-CM | POA: Diagnosis not present

## 2018-04-26 MED ORDER — LIDOCAINE 5 % EX OINT: 1.0000 "application " | TOPICAL_OINTMENT | Freq: Three times a day (TID) | CUTANEOUS | 0 refills | Status: DC | PRN

## 2018-04-26 MED ORDER — ACETAMINOPHEN 500 MG PO TABS
1000.0000 mg | ORAL_TABLET | Freq: Once | ORAL | Status: AC
  Administered 2018-04-26: 1000 mg via ORAL
  Filled 2018-04-26: qty 2

## 2018-04-26 MED ORDER — ACETAMINOPHEN 500 MG PO TABS: 1000.0000 mg | ORAL_TABLET | Freq: Three times a day (TID) | ORAL | 0 refills | Status: AC

## 2018-04-26 NOTE — ED Triage Notes (Signed)
Pt complains of genital warts for several moths but he says it came from shaving, pt doesn't speak [place]

## 2018-04-26 NOTE — ED Notes (Signed)
Pt c/o severe pain r/t genital warts x 4 months.  Pain score 10/10.  Pt reports not eating d/t pain.

## 2018-04-26 NOTE — ED Notes (Signed)
Verbalized understanding discharge instructions, prescriptions, and follow-up. In no acute distress.   

## 2018-04-26 NOTE — ED Provider Notes (Addendum)
[facility]-EMERGENCY DEPT Provider Note  CSN: [id number] Arrival date & time: 04/26/18 0115  Chief Complaint(s) Genital Warts  HPI [name] is a 48 y.o. male with a history of AIDS on antiretroviral viral medication, history of genital warts who presents to the emergency department with pain and itching associated to the general warts.  Patient has been followed up by dermatology who referred the patient to surgery for these warts.  He denies any acute changes or fevers.  No erythema or discharge.  Reports that he has difficulty sitting and walking due to the pain and discomfort.  No other alleviating or aggravating factors.  Denies any dysuria or scrotal swelling.  No other physical complaints.  HPI  Past Medical History Past Medical History:  Diagnosis Date  . Depression    "stress and depression for any man is common" (03/21/2014)  . Genital warts 01/04/2017  . Hepatitis    "I don't know what hepatitis I have"  . HIV disease ([name])   . TB (pulmonary tuberculosis)    previously treated according to refugee documentation   Patient Active Problem List   Diagnosis Date Noted  . Acute bronchiolitis due to respiratory syncytial virus (RSV)   . Bronchiectasis with acute lower respiratory infection ([name])   . Shortness of breath 05/31/2017  . Pulmonary emphysema ([name])   . 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 Chronic systolic CHF (congestive heart failure) ([name]) 04/24/2016  . AKI (acute kidney injury) ([name]) 04/24/2016  . Flu 04/24/2016  . 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 Hypoxia   . Acute respiratory failure with hypoxia ([name]) 02/25/2016  . Normochromic normocytic anemia 02/25/2016  . Pneumonia 02/25/2016  . Protein-calorie malnutrition, severe 02/25/2016  . PE (pulmonary thromboembolism) ([name]) 02/24/2016  . Renal insufficiency   . Surgery, elective   . AIDS (acquired  immune deficiency syndrome) ([name])   . S/P ORIF (open reduction internal fixation) fracture 03/21/2014  . Low back pain 10/10/2013  . Multifocal pneumonia 09/27/2013  . HIV (human immunodeficiency virus infection) ([name]) 09/18/2013  . Refugee health examination 09/18/2013  . Cough 09/18/2013   Home Medication(s) Prior to Admission medications   Medication Sig Start Date End Date Taking? Authorizing Provider  acetaminophen (TYLENOL) 500 MG tablet Take 2 tablets (1,000 mg total) by mouth every 8 (eight) hours for 5 days. Do not take more than 4000 mg of acetaminophen (Tylenol) in a 24-hour period. Please note that other medicines that you may be prescribed may have Tylenol as well. 04/26/18 05/01/18  Cardama, Amadeo Garnet, MD  BACLOFEN PO Take 1 tablet by mouth daily as needed (pain).    [provider]  chlorproMAZINE (THORAZINE) 10 MG tablet Take 1 tablet (10 mg total) by mouth 3 (three) times daily for 14 days. 11/21/17 12/05/17  Mortis, Jerrel Ivory I, PA-C  darunavir-cobicistat (PREZCOBIX) 800-150 MG tablet Take 1 tablet by mouth daily with breakfast. 02/01/18   Kuppelweiser, Cassie L, RPH-CPP  dolutegravir (TIVICAY) 50 MG tablet Take 1 tablet (50 mg total) by mouth daily. 02/01/18   Kuppelweiser, Cassie L, RPH-CPP  feeding supplement (BOOST / RESOURCE BREEZE) LIQD Take 1 Container by mouth 3 (three) times daily between meals. 04/24/16   Leroy Sea, MD  levofloxacin (LEVAQUIN) 750 MG tablet Take 1 tablet (750 mg total) by mouth daily. Starting 3/21 06/02/17   Marthenia Rolling, DO  lidocaine (XYLOCAINE) 5 % ointment Apply 1 application topically 3 (  three) times daily as needed. Apply over genital area for pain relief. 04/26/18   Cardama, Amadeo Garnet, MD  mupirocin cream (BACTROBAN) 2 % Apply 1 application topically 2 (two) times daily. Apply to open/sore areas to help with pain and prevent infection 06/07/17   Judyann Munson, MD  ondansetron (ZOFRAN) 4 MG tablet Take 1 tablet (4 mg total) by  mouth every 8 (eight) hours as needed. kichefuchefu (nausea in swahili) 12/03/16   Judyann Munson, MD  predniSONE (DELTASONE) 50 MG tablet Take 1 tablet (50 mg total) by mouth daily with breakfast. starting 3/21 06/03/17   Marthenia Rolling, DO  sulfamethoxazole-trimethoprim (BACTRIM DS,SEPTRA DS) 800-160 MG tablet TAKE 1 TABLET BY MOUTH ONCE DAILY 06/14/17   Judyann Munson, MD  traMADol (ULTRAM) 50 MG tablet Take 50 mg by mouth every 4 (four) hours as needed for pain. 04/22/17   [provider]                                                                                                                                    Past Surgical History Past Surgical History:  Procedure Laterality Date  . FRACTURE SURGERY    . IM NAILING TIBIA Right 03/21/2014  . ORIF ANKLE FRACTURE Right 03/21/2014   lateral malleolus/notes 03/21/2014  . ORIF ANKLE FRACTURE Right 03/21/2014   Procedure: OPEN REDUCTION INTERNAL FIXATION (ORIF) pilon ;  Surgeon: Eldred Manges, MD;  Location: [facility] OR;  Service: Orthopedics;  Laterality: Right;  . TIBIA IM NAIL INSERTION Right 03/21/2014   Procedure: INTRAMEDULLARY (IM) NAIL TIBIAL;  Surgeon: Eldred Manges, MD;  Location: [facility] OR;  Service: Orthopedics;  Laterality: Right;  Marland Kitchen VIDEO BRONCHOSCOPY Bilateral 03/02/2016   Procedure: VIDEO BRONCHOSCOPY WITHOUT FLUORO;  Surgeon: Roslynn Amble, MD;  Location: [facility] ENDOSCOPY;  Service: Cardiopulmonary;  Laterality: Bilateral;   Family History Family History  Problem Relation Age of Onset  . Hypertension Other   . Heart disease Sister     Social History Social History   Tobacco Use  . Smoking status: Former Smoker    Packs/day: 0.50    Years: 6.00    Pack years: 3.00    Types: Cigarettes    Last attempt to quit: 03/16/2001    Years since quitting: 17.1  . Smokeless tobacco: Never Used  . Tobacco comment: "quit smoking ~ 2003"  Substance Use Topics  . Alcohol use: Yes    Comment: drinks bottled beer intermittently  . Drug use:  No   Allergies Patient has no known allergies.  Review of Systems Review of Systems As noted in HPI Physical Exam Vital Signs  I have reviewed the triage vital signs BP 117/77   Pulse 91   Temp 97.8 F (36.6 C) (Oral)   Resp 17   SpO2 98%   Physical Exam Vitals signs reviewed.  Constitutional:      General: He is not in acute distress.  Appearance: He is well-developed. He is not diaphoretic.  HENT:     Head: Normocephalic and atraumatic.     Jaw: No trismus.     Right Ear: External ear normal.     Left Ear: External ear normal.     Nose: Nose normal.  Eyes:     General: No scleral icterus.    Conjunctiva/sclera: Conjunctivae normal.  Neck:     Musculoskeletal: Normal range of motion.     Trachea: Phonation normal.  Cardiovascular:     Rate and Rhythm: Normal rate and regular rhythm.  Pulmonary:     Effort: Pulmonary effort is normal. No respiratory distress.     Breath sounds: No stridor.  Abdominal:     General: There is no distension.  Genitourinary:   Musculoskeletal: Normal range of motion.  Neurological:     Mental Status: He is alert and oriented to person, place, and time.  Psychiatric:        Behavior: Behavior normal.     ED Results and Treatments Labs (all labs ordered are listed, but only abnormal results are displayed) Labs Reviewed - No data to display                                                                                                                       EKG  EKG Interpretation  Date/Time:    Ventricular Rate:    PR Interval:    QRS Duration:   QT Interval:    QTC Calculation:   R Axis:     Text Interpretation:        Radiology No results found. Pertinent labs & imaging results that were available during my care of the patient were reviewed by me and considered in my medical decision making (see chart for details).  Medications Ordered in ED Medications  acetaminophen (TYLENOL) tablet 1,000 mg (1,000 mg Oral  Given 04/26/18 5537)                                                                                                                                    Procedures Procedures  (including critical care time)  Medical Decision Making / ED Course I have reviewed the nursing notes for this encounter and the patient's prior records (if available in EHR or on provided paperwork).    Genital warts without superimposed infection.  Will be given lidocaine cream for symptomatic relief.  Recommended  follow-up with dermatology/surgery.  Final Clinical Impression(s) / ED Diagnoses Final diagnoses:  Anogenital warts   Disposition: Discharge  Condition: Good  I have discussed the results, Dx and Tx plan with the patient who expressed understanding and agree(s) with the plan. Discharge instructions discussed at great length. The patient was given strict return precautions who verbalized understanding of the instructions. No further questions at time of discharge.    ED Discharge Orders         Ordered    acetaminophen (TYLENOL) 500 MG tablet  Every 8 hours     04/26/18 0611    lidocaine (XYLOCAINE) 5 % ointment  3 times daily PRN     04/26/18 [id number]           Follow Up: Tobey GrimWalden, Jeffrey H, MD [address] [place] [address] [phone]  Schedule an appointment as soon as possible for a visit  As needed      This chart was dictated using voice recognition software.  Despite best efforts to proofread,  errors can occur which can change the documentation meaning.     Nira Connardama, Pedro Eduardo, MD 04/26/18 [phone]

## 2018-04-26 NOTE — Discharge Instructions (Addendum)
Dermatology Kandice HamsWilliam Wei-Ting Huang, MD (Attending) [phone] (Work) [phone] (Fax) [phone] [place], [place] [address] Dermatology

## 2018-05-06 DIAGNOSIS — R918 Other nonspecific abnormal finding of lung field: Secondary | ICD-10-CM | POA: Diagnosis not present

## 2018-05-06 DIAGNOSIS — R0602 Shortness of breath: Secondary | ICD-10-CM | POA: Diagnosis not present

## 2018-05-06 DIAGNOSIS — A630 Anogenital (venereal) warts: Secondary | ICD-10-CM | POA: Insufficient documentation

## 2018-05-18 ENCOUNTER — Encounter: Payer: Self-pay | Admitting: Infectious Disease

## 2018-05-18 ENCOUNTER — Ambulatory Visit (INDEPENDENT_AMBULATORY_CARE_PROVIDER_SITE_OTHER): Payer: Commercial Managed Care - PPO | Admitting: Infectious Disease

## 2018-05-18 VITALS — BP 111/70 | HR 112 | Temp 97.8°F | Wt 165.0 lb

## 2018-05-18 DIAGNOSIS — J470 Bronchiectasis with acute lower respiratory infection: Secondary | ICD-10-CM

## 2018-05-18 DIAGNOSIS — R058 Other specified cough: Secondary | ICD-10-CM

## 2018-05-18 DIAGNOSIS — R05 Cough: Secondary | ICD-10-CM

## 2018-05-18 DIAGNOSIS — R071 Chest pain on breathing: Secondary | ICD-10-CM

## 2018-05-18 DIAGNOSIS — R509 Fever, unspecified: Secondary | ICD-10-CM | POA: Diagnosis not present

## 2018-05-18 DIAGNOSIS — B20 Human immunodeficiency virus [HIV] disease: Secondary | ICD-10-CM

## 2018-05-18 DIAGNOSIS — A150 Tuberculosis of lung: Secondary | ICD-10-CM

## 2018-05-18 DIAGNOSIS — R0781 Pleurodynia: Secondary | ICD-10-CM

## 2018-05-18 DIAGNOSIS — R000 Tachycardia, unspecified: Secondary | ICD-10-CM

## 2018-05-18 MED ORDER — DARUN-COBIC-EMTRICIT-TENOFAF 800-150-200-10 MG PO TABS: 1.0000 | ORAL_TABLET | Freq: Every day | ORAL | 11 refills | Status: DC

## 2018-05-18 MED ORDER — DOLUTEGRAVIR SODIUM 50 MG PO TABS: 50.0000 mg | ORAL_TABLET | Freq: Every day | ORAL | 11 refills | Status: DC

## 2018-05-18 NOTE — Telephone Encounter (Signed)
Perfect Lorene Dy! One other question maybe you can ask Cassie as well. Did he leave country in Nov and Dec and do we know where he went I had no idea of his travel Before. I'm wondering for ex he went to the [facility] and now has a PE from long flight?

## 2018-05-18 NOTE — Progress Notes (Signed)
Subjective:   Chief complaint: chest tightness x6 weeks, reductive cough with chest pain x2 weeks   Patient ID: [name], male    DOB: [birth year], 48 y.o.   MRN: [id number]  HPI  NOTE : Much of the history below was pieced together from conversations with Dot Been from the health department in addition to the conversations with the patient himself and with Dr. Baxter Flattery  This is a 48 year old man from the [place] who was diagnosed with HIV and, pulmonary tuberculosis while in the [place].  He was reportedly treated with 8 months of therapy while there.  Eventually immigrated [place] roughly 5 years ago.  Today he himself told me that he had never had tuberculosis when I was asking about the history but this is clearly either a misunderstanding or is not being truthful.  He also had been diagnosed previously with home Neri embolism as referenced on last CT scan that was obtained in 2017.  He has been worked up for tuberculosis while [place] with a broncho-Koska P with BAL which was negative for AFB by smear negative for MTB by PCR.  He may have traveled abroad but I did not get this history until had spoken with Santiago Glad and she had noted of note from infectious disease pharmacy that he was preparing to travel.  Need to clarify when that travel was made but it certainly was prior to the past 2 months in the note  That references the travel is in November 2019.  In any case he was being evaluated at [facility] for surgical evaluation of his genital warts.  Apparently he was coughing a fair amount and was short of breath while being evaluated by the surgeons at [facility].  They were concerned and sent him for chest x-ray and then to the ER.  I do not see the ever went to the ER but he did have a chest x-ra showed changes consistent with prior tuberculosis but also read as not being able to exclude active pulmonary tuberculosis.  He has had 3 sputum's collected by  the [facility] and they are so far smear negative.  Last sputum specimen was collected earlier this week.  He resisted urging of Dot Been health department for her to him to go to the emergency department or his primary care physician or here infectious disease clinic.  He finally did come today and was seen by myself this morning.  There was a history I would be related to me of a 6-week history of fevers though the patient states he has not had any fevers.  He claims he is only having difficulty breathing along with now coughing and chest pain.  Going to repeat his HIV labs I am also ordering a CT angiogram to look for pulmonary embolism and better define his lung pathology.  He does carry a history of bronchiectasis and he may simply have a superimposed bacterial pneumonia in the context of lungs that is been damaged from tuberculosis.  But he certainly also could have a pulmonary embolism in the midst of this in particular if he traveled to [place] and back, and the fact that he has had prior pulmonary is him embolism.  He was tachycardic but saturating well on room air.  Past Medical History:  Diagnosis Date  . Depression    "stress and depression for any man is common" (03/21/2014)  . Genital warts 01/04/2017  . Hepatitis    "  I don't know what hepatitis I have"  . HIV disease ([place])   . TB (pulmonary tuberculosis)    previously treated according to refugee documentation    Past Surgical History:  Procedure Laterality Date  . FRACTURE SURGERY    . IM NAILING TIBIA Right 03/21/2014  . ORIF ANKLE FRACTURE Right 03/21/2014   lateral malleolus/notes 03/21/2014  . ORIF ANKLE FRACTURE Right 03/21/2014   Procedure: OPEN REDUCTION INTERNAL FIXATION (ORIF) pilon ;  Surgeon: Marybelle Killings, MD;  Location: [facility];  Service: Orthopedics;  Laterality: Right;  . TIBIA IM NAIL INSERTION Right 03/21/2014   Procedure: INTRAMEDULLARY (IM) NAIL TIBIAL;  Surgeon: Marybelle Killings, MD;  Location:  [facility];  Service: Orthopedics;  Laterality: Right;  Marland Kitchen VIDEO BRONCHOSCOPY Bilateral 03/02/2016   Procedure: VIDEO BRONCHOSCOPY WITHOUT FLUORO;  Surgeon: Javier Glazier, MD;  Location: [facility];  Service: Cardiopulmonary;  Laterality: Bilateral;    Family History  Problem Relation Age of Onset  . Hypertension Other   . Heart disease Sister       Social History   Socioeconomic History  . Marital status: Single    Spouse name: Not on file  . Number of children: Not on file  . Years of education: Not on file  . Highest education level: Not on file  Occupational History  . Not on file  Social Needs  . Financial resource strain: Not on file  . Food insecurity:    Worry: Not on file    Inability: Not on file  . Transportation needs:    Medical: Not on file    Non-medical: Not on file  Tobacco Use  . Smoking status: Former Smoker    Packs/day: 0.50    Years: 6.00    Pack years: 3.00    Types: Cigarettes    Last attempt to quit: 03/16/2001    Years since quitting: 17.1  . Smokeless tobacco: Never Used  . Tobacco comment: "quit smoking ~ 2003"  Substance and Sexual Activity  . Alcohol use: Yes    Comment: drinks bottled beer intermittently  . Drug use: No  . Sexual activity: Not Currently    Partners: Female  Lifestyle  . Physical activity:    Days per week: Not on file    Minutes per session: Not on file  . Stress: Not on file  Relationships  . Social connections:    Talks on phone: Not on file    Gets together: Not on file    Attends religious service: Not on file    Active member of club or organization: Not on file    Attends meetings of clubs or organizations: Not on file    Relationship status: Not on file  Other Topics Concern  . Not on file  Social History Narrative   As of 09/18/2013:   -Arrived in [place]: September 05, 2013    -Refugee from [facility], spent 4 years in refugee camp in [place].    -Language: [name], requires Pharmacist, community (speaks essentially no  [place])   -Education: no formal education, previously worked as a Gaffer: family phone number [phone], caseworker is Laqueta Linden Dar Bi [phone] (with [facility])   -lives with daughter and three sons   -denies alcohol, drug, tobacco abuse       Pulmonary (02/28/16):   Patient now admitted to drinking bottled beer. Reports continued tobacco abstinence. Denies any bird or mold exposure. No pets currently. No recent travel.  No Known Allergies   Current Outpatient Medications:  .  BACLOFEN PO, Take 1 tablet by mouth daily as needed (pain)., Disp: , Rfl:  .  feeding supplement (BOOST / RESOURCE BREEZE) LIQD, Take 1 Container by mouth 3 (three) times daily between meals., Disp: 90 Container, Rfl: 0 .  levofloxacin (LEVAQUIN) 750 MG tablet, Take 1 tablet (750 mg total) by mouth daily. Starting 3/21, Disp: 6 tablet, Rfl: 0 .  lidocaine (XYLOCAINE) 5 % ointment, Apply 1 application topically 3 (three) times daily as needed. Apply over genital area for pain relief., Disp: 35.44 g, Rfl: 0 .  mupirocin cream (BACTROBAN) 2 %, Apply 1 application topically 2 (two) times daily. Apply to open/sore areas to help with pain and prevent infection, Disp: 30 g, Rfl: 4 .  predniSONE (DELTASONE) 50 MG tablet, Take 1 tablet (50 mg total) by mouth daily with breakfast. starting 3/21, Disp: 3 tablet, Rfl: 0 .  traMADol (ULTRAM) 50 MG tablet, Take 50 mg by mouth every 4 (four) hours as needed for pain., Disp: , Rfl: 0 .  chlorproMAZINE (THORAZINE) 10 MG tablet, Take 1 tablet (10 mg total) by mouth 3 (three) times daily for 14 days., Disp: 42 tablet, Rfl: 0 .  Darunavir-Cobicisctat-Emtricitabine-Tenofovir Alafenamide (SYMTUZA) 800-150-200-10 MG TABS, Take 1 tablet by mouth daily with breakfast., Disp: 30 tablet, Rfl: 11 .  dolutegravir (TIVICAY) 50 MG tablet, Take 1 tablet (50 mg total) by mouth daily., Disp: 30 tablet, Rfl: 11 .  ondansetron (ZOFRAN) 4 MG tablet, Take 1 tablet (4 mg  total) by mouth every 8 (eight) hours as needed. kichefuchefu (nausea in swahili) (Patient not taking: Reported on 05/18/2018), Disp: 20 tablet, Rfl: 3 .  sulfamethoxazole-trimethoprim (BACTRIM DS,SEPTRA DS) 800-160 MG tablet, TAKE 1 TABLET BY MOUTH ONCE DAILY (Patient not taking: Reported on 05/18/2018), Disp: 30 tablet, Rfl: 5     Review of Systems  Constitutional: Negative for activity change, appetite change, chills, diaphoresis, fatigue, fever and unexpected weight change.  HENT: Negative for congestion, rhinorrhea, sinus pressure, sneezing, sore throat and trouble swallowing.   Eyes: Negative for photophobia and visual disturbance.  Respiratory: Positive for cough, chest tightness and shortness of breath. Negative for wheezing and stridor.   Cardiovascular: Positive for chest pain and palpitations. Negative for leg swelling.  Gastrointestinal: Negative for abdominal distention, abdominal pain, anal bleeding, blood in stool, constipation, diarrhea, nausea and vomiting.  Genitourinary: Negative for difficulty urinating, dysuria, flank pain and hematuria.  Musculoskeletal: Negative for arthralgias, back pain, gait problem, joint swelling and myalgias.  Skin: Negative for color change, pallor, rash and wound.  Neurological: Negative for dizziness, tremors, weakness and light-headedness.  Hematological: Negative for adenopathy. Does not bruise/bleed easily.  Psychiatric/Behavioral: Negative for agitation, behavioral problems, confusion, decreased concentration, dysphoric mood and sleep disturbance.       Objective:   Physical Exam Vitals signs and nursing note reviewed.  Constitutional:      General: He is not in acute distress.    Appearance: Normal appearance. He is well-developed. He is not ill-appearing or diaphoretic.  HENT:     Head: Normocephalic and atraumatic.     Right Ear: Hearing and external ear normal.     Left Ear: Hearing and external ear normal.     Nose: No nasal  deformity or rhinorrhea.  Eyes:     General: No scleral icterus.    Conjunctiva/sclera: Conjunctivae normal.     Right eye: Right conjunctiva is not injected.     Left eye: Left conjunctiva  is not injected.     Pupils: Pupils are equal, round, and reactive to light.  Neck:     Musculoskeletal: Normal range of motion and neck supple.     Vascular: No JVD.  Cardiovascular:     Rate and Rhythm: Regular rhythm. Tachycardia present.     Heart sounds: Normal heart sounds, S1 normal and S2 normal. No murmur. No friction rub. No gallop.   Pulmonary:     Effort: No respiratory distress.     Breath sounds: No stridor. Rhonchi present. No wheezing.  Chest:     Chest wall: No tenderness.  Abdominal:     General: Abdomen is flat. Bowel sounds are normal. There is no distension.     Palpations: Abdomen is soft.  Musculoskeletal: Normal range of motion.     Right shoulder: Normal.     Left shoulder: Normal.     Right hip: Normal.     Left hip: Normal.     Right knee: Normal.     Left knee: Normal.  Lymphadenopathy:     Head:     Right side of head: No submandibular, preauricular or posterior auricular adenopathy.     Left side of head: No submandibular, preauricular or posterior auricular adenopathy.     Cervical: No cervical adenopathy.     Right cervical: No superficial or deep cervical adenopathy.    Left cervical: No superficial or deep cervical adenopathy.  Skin:    General: Skin is warm and dry.     Coloration: Skin is not pale.     Findings: No abrasion, bruising, ecchymosis, erythema, lesion or rash.     Nails: There is no clubbing.   Neurological:     General: No focal deficit present.     Mental Status: He is alert and oriented to person, place, and time.     Sensory: No sensory deficit.     Coordination: Coordination normal.     Gait: Gait normal.  Psychiatric:        Attention and Perception: He is attentive.        Mood and Affect: Mood normal.        Speech: Speech  normal.        Behavior: Behavior normal. Behavior is cooperative.        Thought Content: Thought content normal.        Judgment: Judgment normal.           Assessment & Plan:   Chest pain with productive cough:  Has history of prior pulmonary embolism and chronicity of his symptoms I am getting a CT angiogram of the lungs to look for pulmonary embolism as well as other lung pathology.  3 smears that are negative at the health department and cultures are incubating.  Seem unlikely that he would have active pulmonary tuberculosis at present.  I would not visualize though if he has a pulmonary embolism or a pneumonia that is developed in his abnormal lungs.  Hopefully has been adherent to his antiretroviral medications he certainly recognize them quite easily but his last CD4 count was 110 on medications but he has been as low as the teens before.  Obviously this would raise the possibility of PCP as he is not taking his Bactrim  HIV disease: He has significant NNRTI resistance which was why he was placed on Prezcobix and TIVICAY.  I am going to go ahead and put him on Symtuza and Tintah even though I am not technically adding  much by having these nucleotides on board I still feel that it will not harm him and they will still have some degree of activity in the pill is smaller than the Prezcobix.  Labs today as well.  I spent greater than 40 minutes with the patient including greater than 50% of time in face to face counsel of the patient with an interpreter regarding his symptoms of chest pain and sputum production shortness of breath differential diagnosis of these regarding his HIV management and medications and in coordination of his care with the health department infections these pharmacy.  I have written for him to out of work until further notice Marcello Moores in the health department.  I will have him follow-up in the next 4 weeks with Dr. Baxter Flattery (presuming he does not have  something ID on CTA that requires hospitalization)

## 2018-05-18 NOTE — Patient Instructions (Signed)
I am ordering labs today  I will also order a CT of your chest to help [place] understand why you are coughing and having chest pain  Please make an appt with Dr. Drue Second in roughly a month  (we may call you before then if your tests are worrisome to [place])

## 2018-05-19 LAB — T-HELPER CELL (CD4) - (RCID CLINIC ONLY)
CD4 T CELL HELPER: 7 % — AB (ref 33–55)
CD4 T Cell Abs: 100 /uL — ABNORMAL LOW (ref 400–2700)

## 2018-05-19 NOTE — Telephone Encounter (Signed)
Thx Cassie !

## 2018-05-19 NOTE — Telephone Encounter (Signed)
I'm not sure where he traveled to. I just remember [facility] reaching out to [place] because he told them he needed a 3 month supply and was traveling.

## 2018-05-20 ENCOUNTER — Other Ambulatory Visit: Payer: Self-pay | Admitting: Pharmacist

## 2018-05-20 DIAGNOSIS — B20 Human immunodeficiency virus [HIV] disease: Secondary | ICD-10-CM

## 2018-05-20 MED ORDER — SULFAMETHOXAZOLE-TRIMETHOPRIM 800-160 MG PO TABS: 1.0000 | ORAL_TABLET | Freq: Every day | ORAL | 5 refills | Status: DC

## 2018-05-20 NOTE — Progress Notes (Signed)
Sending Bactrim DS to [facility] since patient's CD4 count remains <200.

## 2018-05-23 LAB — CBC WITH DIFFERENTIAL/PLATELET
ABSOLUTE MONOCYTES: 722 {cells}/uL (ref 200–950)
Basophils Absolute: 33 cells/uL (ref 0–200)
Basophils Relative: 0.4 %
EOS ABS: 158 {cells}/uL (ref 15–500)
Eosinophils Relative: 1.9 %
HCT: 32.2 % — ABNORMAL LOW (ref 38.5–50.0)
Hemoglobin: 10.9 g/dL — ABNORMAL LOW (ref 13.2–17.1)
LYMPHS ABS: 1502 {cells}/uL (ref 850–3900)
MCH: 32.4 pg (ref 27.0–33.0)
MCHC: 33.9 g/dL (ref 32.0–36.0)
MCV: 95.8 fL (ref 80.0–100.0)
MPV: 9.4 fL (ref 7.5–12.5)
Monocytes Relative: 8.7 %
NEUTROS PCT: 70.9 %
Neutro Abs: 5885 cells/uL (ref 1500–7800)
Platelets: 277 10*3/uL (ref 140–400)
RBC: 3.36 10*6/uL — AB (ref 4.20–5.80)
RDW: 11.8 % (ref 11.0–15.0)
Total Lymphocyte: 18.1 %
WBC: 8.3 10*3/uL (ref 3.8–10.8)

## 2018-05-23 LAB — COMPLETE METABOLIC PANEL WITH GFR
AG RATIO: 0.7 (calc) — AB (ref 1.0–2.5)
ALBUMIN MSPROF: 3.3 g/dL — AB (ref 3.6–5.1)
ALKALINE PHOSPHATASE (APISO): 74 U/L (ref 36–130)
ALT: 5 U/L — ABNORMAL LOW (ref 9–46)
AST: 12 U/L (ref 10–40)
BILIRUBIN TOTAL: 0.5 mg/dL (ref 0.2–1.2)
BUN: 14 mg/dL (ref 7–25)
CHLORIDE: 101 mmol/L (ref 98–110)
CO2: 29 mmol/L (ref 20–32)
Calcium: 9.6 mg/dL (ref 8.6–10.3)
Creat: 1.2 mg/dL (ref 0.60–1.35)
GFR, Est African American: 83 mL/min/{1.73_m2} (ref >=60)
GFR, Est Non African American: 72 mL/min/{1.73_m2} (ref >=60)
GLOBULIN: 4.6 g/dL — AB (ref 1.9–3.7)
Glucose, Bld: 194 mg/dL — ABNORMAL HIGH (ref 65–99)
POTASSIUM: 4.1 mmol/L (ref 3.5–5.3)
SODIUM: 139 mmol/L (ref 135–146)
Total Protein: 7.9 g/dL (ref 6.1–8.1)

## 2018-05-23 LAB — RPR: RPR Ser Ql: NONREACTIVE

## 2018-05-23 LAB — QUANTIFERON-TB GOLD PLUS
MITOGEN-NIL: 5.87 [IU]/mL
NIL: 0.05 [IU]/mL
QuantiFERON-TB Gold Plus: NEGATIVE
TB1-NIL: 0.02 IU/mL
TB2-NIL: 0.02 IU/mL

## 2018-05-23 LAB — HIV RNA, RTPCR W/R GT (RTI, PI,INT)
HIV 1 RNA Quant: 76 copies/mL — ABNORMAL HIGH
HIV-1 RNA QUANT, LOG: 1.88 {Log_copies}/mL — AB

## 2018-05-23 LAB — D-DIMER, QUANTITATIVE (NOT AT ARMC): D DIMER QUANT: 1.07 ug{FEU}/mL — AB (ref <0.50)

## 2018-05-25 ENCOUNTER — Ambulatory Visit
Admission: RE | Admit: 2018-05-25 | Discharge: 2018-05-25 | Disposition: A | Discharge status: Home / Self Care | Payer: Commercial Managed Care - PPO | Source: Ambulatory Visit | Attending: Infectious Disease | Admitting: Infectious Disease

## 2018-05-25 DIAGNOSIS — R071 Chest pain on breathing: Secondary | ICD-10-CM

## 2018-05-25 DIAGNOSIS — B20 Human immunodeficiency virus [HIV] disease: Secondary | ICD-10-CM

## 2018-05-25 MED ORDER — IOPAMIDOL (ISOVUE-370) INJECTION 76%
75.0000 mL | Freq: Once | INTRAVENOUS | Status: AC | PRN
  Administered 2018-05-25: 75 mL via INTRAVENOUS

## 2018-06-06 ENCOUNTER — Telehealth: Payer: Self-pay | Admitting: Pharmacy Technician

## 2018-06-06 ENCOUNTER — Other Ambulatory Visit: Payer: Self-pay | Admitting: Pharmacist

## 2018-06-06 DIAGNOSIS — B20 Human immunodeficiency virus [HIV] disease: Secondary | ICD-10-CM

## 2018-06-06 MED ORDER — SULFAMETHOXAZOLE-TRIMETHOPRIM 800-160 MG PO TABS: 1.0000 | ORAL_TABLET | Freq: Every day | ORAL | 5 refills | Status: DC

## 2018-06-06 MED ORDER — DARUN-COBIC-EMTRICIT-TENOFAF 800-150-200-10 MG PO TABS: 1.0000 | ORAL_TABLET | Freq: Every day | ORAL | 5 refills | Status: DC

## 2018-06-06 MED ORDER — DOLUTEGRAVIR SODIUM 50 MG PO TABS: 50.0000 mg | ORAL_TABLET | Freq: Every day | ORAL | 5 refills | Status: DC

## 2018-06-06 NOTE — Telephone Encounter (Signed)
[facility] Patient Advocate Encounter  Completed manufacturer application for [employer] and Tivicay in an effort to reduce patient's out of pocket expense.    Application completed and faxed to [facility].   He is approved for 6 months and will need to apply for ADAP.  He speaks [name] therefore will need an interpreter.  Netty Starring. Dimas Aguas CPhT Specialty Pharmacy Patient [facility] for Infectious Disease Phone: [phone] Fax:  [phone]

## 2018-06-15 ENCOUNTER — Telehealth: Payer: Self-pay | Admitting: Infectious Disease

## 2018-06-15 MED ORDER — AZITHROMYCIN 250 MG PO TABS: ORAL_TABLET | ORAL | 0 refills | Status: DC

## 2018-06-15 NOTE — Telephone Encounter (Signed)
Last week I received call from Surgeon at [facility] who understandably did not want to perform ELECTIVE surgery now  Additionally he was concerned by patients persistent cough  His AFB cultures are NOT growing MTB or M avium  I would like him however to take azithromycin 500mg  x 1 and 250mg  daily x 4 days  Can someone let him know this and tell him he needs to be sheltering in place.

## 2018-06-16 ENCOUNTER — Other Ambulatory Visit: Payer: Self-pay | Admitting: Behavioral Health

## 2018-06-16 DIAGNOSIS — J471 Bronchiectasis with (acute) exacerbation: Secondary | ICD-10-CM

## 2018-06-16 DIAGNOSIS — J470 Bronchiectasis with acute lower respiratory infection: Secondary | ICD-10-CM

## 2018-06-16 MED ORDER — AZITHROMYCIN 250 MG PO TABS: ORAL_TABLET | ORAL | 0 refills | Status: DC

## 2018-06-16 NOTE — Addendum Note (Signed)
Addended by: Gildardo Griffes on: 06/16/2018 10:39 AM   Modules accepted: Orders

## 2018-06-16 NOTE — Telephone Encounter (Signed)
Thanks so much Morrie Sheldon  I think also good if we set him up with someone for an EVISIT next week as well

## 2018-06-16 NOTE — Telephone Encounter (Signed)
Called [name] with [name] interpreter [phone] informed him per Dr Daiva Eves that he the Surgeon at [facility] is not performing Elective surgeries at this time and his surgery is postponed for now.  However they are concerned with his persistent cough.  His AFB cultures were negative.  Dr. Daiva Eves called in Azithromycin 500mg   x1 and Azithromycin 250 mg X4 day to [facility].  Also informed him he needs to shelter in place.  Patient verbalized understanding.  Patient verbalized understanding.  Patient states he does not have transportation to the pharmacy so will sent prescription to [facility] so it can be delivered to his home. Angeline Slim RN

## 2018-06-21 NOTE — Telephone Encounter (Signed)
Thank you [name]!

## 2018-06-21 NOTE — Telephone Encounter (Addendum)
Called patient per Dr. Daiva Eves.  Set him up with 30 minute EVISIT with Rexene Alberts.  Patient gave consent to EVISIT and verified phone number.  He states he is unable to set up My Chart due to the language barrier.  Angeline Slim RN

## 2018-06-23 ENCOUNTER — Encounter: Payer: Self-pay | Admitting: Infectious Diseases

## 2018-06-23 ENCOUNTER — Other Ambulatory Visit: Payer: Self-pay

## 2018-06-23 ENCOUNTER — Ambulatory Visit (INDEPENDENT_AMBULATORY_CARE_PROVIDER_SITE_OTHER): Payer: Commercial Managed Care - PPO | Admitting: Infectious Diseases

## 2018-06-23 DIAGNOSIS — Z21 Asymptomatic human immunodeficiency virus [HIV] infection status: Secondary | ICD-10-CM

## 2018-06-23 DIAGNOSIS — R05 Cough: Secondary | ICD-10-CM

## 2018-06-23 DIAGNOSIS — R059 Cough, unspecified: Secondary | ICD-10-CM

## 2018-06-23 DIAGNOSIS — J470 Bronchiectasis with acute lower respiratory infection: Secondary | ICD-10-CM

## 2018-06-23 NOTE — Assessment & Plan Note (Signed)
Recently treated with azithromycin.  He is got chronic severe bronchiectatic changes and likely had superimposed bacterial infection recently.  He sounds improved.

## 2018-06-23 NOTE — Progress Notes (Signed)
Name: MANIT PUGLIANO  TSV:[id number]   DOB: 02-Apr-1970   PCP: Tobey Grim, MD   Virtual Visit via Telephone Note  I connected with Antonieta Iba on 06/23/18 at  3:00 PM EDT by telephone and verified that I am speaking with the correct person using two identifiers.  Interpreter services was utilized during her visit on the phone.   I discussed the limitations, risks, security and privacy concerns of performing an evaluation and management service by telephone and the availability of in person appointments. I also discussed with the patient that there may be a patient responsible charge related to this service. The patient expressed understanding and agreed to proceed.   Chief Complaint  Patient presents with  . Follow-up    Cough     History of Present Illness: [name] P Skeeters is a 48 y.o. African male from [place] with HIV disease on treatment with Symtuza and Tivicay.   He last saw Dr. Daiva Eves about a month ago in the office.  At the time he was complaining of pleuritic chest pain.  He has a history of pulmonary tuberculosis that has been treated.  However in the chart there is some conflicting information whether this to be true or not.  At his visit with Dr. Algis Liming he was complaining of chest pain with a productive cough.  He has been working with the health department in regards to sputum sampling.  Per Dr. Lattie Haw note he had 3 samples that were smear negative with negative growth on the AFB cultures.  CT angiogram with and without contrast was obtained to evaluate atypical infection/pulmonary embolus/effusion.  There was no active pulmonary embolus on his scan 3 weeks ago.  He is got patchy foci of tree-in-bud micro-nodularity in the central aspect of the right lower lobe concerning for infectious process with reactive lymph nodes surrounding this site.  He has advanced chronic bronchial parenchymal changes bilaterally with severe bronchiectasis throughout the left lung.   In preparation for his elective genital wart surgery with [facility] he was identified to have had worsening of his chronic cough.  Dr. Daiva Eves prescribed him 5 days of azithromycin.  The patient tells me that his cough has improved since his treatment but it still comes and goes at times but not as severe nor as frequent.  Later goes into discussion that his cough is completely resolved and no longer has any trouble with it whatsoever.  He thinks he has been seeing a lung doctor but in reality I believe this is the health department he is referring to.  He denies any fevers, night sweats, weight loss.  Anxious to have his surgery to remove the genital warts which I understand are quite extensive.  He cannot tell me where his pharmacy is, what his medications are.  He just states that he is taking everything we give him.  Medical/surgical/social/family history have been updated during today's visit.    Observations/Objective: No cough observed on the phone call today.  HIV 1 RNA Quant (copies/mL)  Date Value  05/18/2018 76 (H)  10/11/2017 37 (H)  05/31/2017 30   CD4 T Cell Abs (/uL)  Date Value  05/18/2018 100 (L)  10/11/2017 110 (L)  05/31/2017 14 (L)    Lab Results  Component Value Date   CREATININE 1.20 05/18/2018   CREATININE 1.12 10/11/2017   CREATININE 1.19 06/01/2017    Lab Results  Component Value Date   WBC 8.3 05/18/2018   HGB 10.9 (  L) 05/18/2018   HCT 32.2 (L) 05/18/2018   MCV 95.8 05/18/2018   PLT 277 05/18/2018    Lab Results  Component Value Date   ALT 5 (L) 05/18/2018   AST 12 05/18/2018   ALKPHOS 59 04/23/2016   BILITOT 0.5 05/18/2018     Assessment and Plan: Problem List Items Addressed This Visit      Unprioritized   HIV (human immunodeficiency virus infection) ([name])    He has had a low level viremia.  He is on salvage regimen of Symtuza plus Tivicay.  I hope that he is taking this every day, he states that he is although he has no details as  to what medicines he is on.  He cannot tell me the color of his pills. We will have him come back for a scheduled appointment to see Dr. Drue SecondSnider in 2 months.  I made these appointments with him on the phone and he wrote them down.  He should also be taking Bactrim for PCP prophylaxis.  I shared the results of his most recent labs with him over the phone today, however I do not feel like he has a great understanding.  HIV 1 RNA Quant (copies/mL)  Date Value  05/18/2018 76 (H)  10/11/2017 37 (H)  05/31/2017 30   CD4 T Cell Abs (/uL)  Date Value  05/18/2018 100 (L)  10/11/2017 110 (L)  05/31/2017 14 (L)         Cough    I offered him a referral to the pulmonology team considering his severe bronchiectasis and scarring.  He declined after he told me and contradicted what his earlier statement was that his cough has completely resolved.  I told him that given his changes on the lung scan he is at risk for recurrent infections and should this become a more frequent problem we need to partner with a lung doctor to help get the best treatment for him possible.      Bronchiectasis with acute lower respiratory infection ([name]) - Primary    Recently treated with azithromycin.  He is got chronic severe bronchiectatic changes and likely had superimposed bacterial infection recently.  He sounds improved.          Follow Up Instructions: Return to clinic in 2 months with Dr. Ilsa IhaSnyder.  Can do labs at the visit.   I discussed the assessment and treatment plan with the patient. The patient was provided an opportunity to ask questions and all were answered. The patient agreed with the plan and demonstrated an understanding of the instructions.   The patient was advised to call back or seek an in-person evaluation if the symptoms worsen or if the condition fails to improve as anticipated.  I provided 30 minutes of non-face-to-face time during this encounter.   Rexene AlbertsStephanie , MSN, NP-C [facility]  [email or web address] Pager: [phone] Office: [phone] [facility] Main Line: [phone]

## 2018-06-23 NOTE — Assessment & Plan Note (Signed)
I offered him a referral to the pulmonology team considering his severe bronchiectasis and scarring.  He declined after he told me and contradicted what his earlier statement was that his cough has completely resolved.  I told him that given his changes on the lung scan he is at risk for recurrent infections and should this become a more frequent problem we need to partner with a lung doctor to help get the best treatment for him possible.

## 2018-06-23 NOTE — Assessment & Plan Note (Addendum)
He has had a low level viremia.  He is on salvage regimen of Symtuza plus Tivicay.  I hope that he is taking this every day, he states that he is although he has no details as to what medicines he is on.  He cannot tell me the color of his pills. We will have him come back for a scheduled appointment to see Dr. Drue Second in 2 months.  I made these appointments with him on the phone and he wrote them down.  He should also be taking Bactrim for PCP prophylaxis.  I shared the results of his most recent labs with him over the phone today, however I do not feel like he has a great understanding.  HIV 1 RNA Quant (copies/mL)  Date Value  05/18/2018 76 (H)  10/11/2017 37 (H)  05/31/2017 30   CD4 T Cell Abs (/uL)  Date Value  05/18/2018 100 (L)  10/11/2017 110 (L)  05/31/2017 14 (L)

## 2018-08-18 ENCOUNTER — Telehealth: Payer: Self-pay | Admitting: Internal Medicine

## 2018-08-18 NOTE — Telephone Encounter (Signed)
COVID-19 Pre-Screening Questions:08/18/18 ° °Do you currently have a fever (>100 °F), chills or unexplained body aches?NO ° °Are you currently experiencing new cough, shortness of breath, sore throat, runny nose?NO °•  °Have you recently travelled outside the state of [place] in the last 14 days? NO °•  °Have you been in contact with someone that is currently pending confirmation of Covid19 testing or has been confirmed to have the Covid19 virus?  NO  ° °**If the patient answers NO to ALL questions -  advise the patient to please call the clinic before coming to the office should any symptoms develop.  ° ° ° °

## 2018-08-22 ENCOUNTER — Ambulatory Visit (INDEPENDENT_AMBULATORY_CARE_PROVIDER_SITE_OTHER): Payer: Self-pay | Admitting: Internal Medicine

## 2018-08-22 ENCOUNTER — Encounter: Payer: Self-pay | Admitting: Internal Medicine

## 2018-08-22 ENCOUNTER — Other Ambulatory Visit: Payer: Self-pay | Admitting: Internal Medicine

## 2018-08-22 ENCOUNTER — Other Ambulatory Visit: Payer: Self-pay

## 2018-08-22 VITALS — BP 109/73 | HR 103 | Temp 98.1°F | Wt 174.0 lb

## 2018-08-22 DIAGNOSIS — N5089 Other specified disorders of the male genital organs: Secondary | ICD-10-CM

## 2018-08-22 DIAGNOSIS — B20 Human immunodeficiency virus [HIV] disease: Secondary | ICD-10-CM

## 2018-08-22 DIAGNOSIS — A630 Anogenital (venereal) warts: Secondary | ICD-10-CM

## 2018-08-22 NOTE — Progress Notes (Signed)
RFV: follow up for hiv disease, in person  Patient ID: [name], male   DOB: 12/10/1970, 48 y.o.   MRN: [id number]  HPI [name] is a 48 yo M with HIV disease, CD 4 count of 100/VL 76 in March 2020. He is here with his nephew. [name] has not received his surgery from [facility] and it sounds like they are coordinating plastics-urology combination for extensive surgery.   He states that he is not having significant drainage but still great discomfort  In the meantime, he denies cough, he was ruled out for mTB, and treated for bacterial pneumonia this spring.  He is considering applying for disability given his multiple co-morbidities  Outpatient Encounter Medications as of 08/22/2018  Medication Sig  . Darunavir-Cobicisctat-Emtricitabine-Tenofovir Alafenamide (SYMTUZA) 800-150-200-10 MG TABS Take 1 tablet by mouth daily with breakfast.  . dolutegravir (TIVICAY) 50 MG tablet Take 1 tablet (50 mg total) by mouth daily.  Marland Kitchen. lidocaine (XYLOCAINE) 5 % ointment Apply 1 application topically 3 (three) times daily as needed. Apply over genital area for pain relief.  . mupirocin cream (BACTROBAN) 2 % Apply 1 application topically 2 (two) times daily. Apply to open/sore areas to help with pain and prevent infection  . azithromycin (ZITHROMAX Z-PAK) 250 MG tablet Two tablets first day then one tablet daily (Patient not taking: Reported on 08/22/2018)  . BACLOFEN PO Take 1 tablet by mouth daily as needed (pain).  . feeding supplement (BOOST / RESOURCE BREEZE) LIQD Take 1 Container by mouth 3 (three) times daily between meals. (Patient not taking: Reported on 08/22/2018)  . levofloxacin (LEVAQUIN) 750 MG tablet Take 1 tablet (750 mg total) by mouth daily. Starting 3/21 (Patient not taking: Reported on 08/22/2018)  . ondansetron (ZOFRAN) 4 MG tablet Take 1 tablet (4 mg total) by mouth every 8 (eight) hours as needed. kichefuchefu (nausea in swahili) (Patient not taking: Reported on 05/18/2018)  . predniSONE (DELTASONE)  50 MG tablet Take 1 tablet (50 mg total) by mouth daily with breakfast. starting 3/21 (Patient not taking: Reported on 08/22/2018)  . sulfamethoxazole-trimethoprim (BACTRIM DS,SEPTRA DS) 800-160 MG tablet Take 1 tablet by mouth daily. (Patient not taking: Reported on 08/22/2018)  . traMADol (ULTRAM) 50 MG tablet Take 50 mg by mouth every 4 (four) hours as needed for pain.   No facility-administered encounter medications on file as of 08/22/2018.      Patient Active Problem List   Diagnosis Date Noted  . Bronchiectasis with acute lower respiratory infection ([name])   . Pulmonary emphysema ([name])   . 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 Chronic systolic CHF (congestive heart failure) ([name]) 04/24/2016  . AKI (acute kidney injury) ([name]) 04/24/2016  . 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 Hypoxia   . Acute respiratory failure with hypoxia ([name]) 02/25/2016  . Normochromic normocytic anemia 02/25/2016  . Pneumonia 02/25/2016  . Protein-calorie malnutrition, severe 02/25/2016  . PE (pulmonary thromboembolism) ([name]) 02/24/2016  . Renal insufficiency   . Surgery, elective   . AIDS (acquired immune deficiency syndrome) ([name])   . S/P ORIF (open reduction internal fixation) fracture 03/21/2014  . Low back pain 10/10/2013  . Multifocal pneumonia 09/27/2013  . HIV (human immunodeficiency virus infection) ([name]) 09/18/2013  . Refugee health examination 09/18/2013  . Cough 09/18/2013     Health Maintenance Due  Topic Date Due  . TETANUS/TDAP  03/16/1990     Review of Systems Review of Systems  Constitutional:  Negative for fever, chills, diaphoresis, activity change, appetite change, fatigue and unexpected weight change.  HENT: Negative for congestion, sore throat, rhinorrhea, sneezing, trouble swallowing and sinus pressure.  Eyes: Negative for photophobia and visual disturbance.  Respiratory: Negative for cough, chest  tightness, shortness of breath, wheezing and stridor.  Cardiovascular: Negative for chest pain, palpitations and leg swelling.  Gastrointestinal: Negative for nausea, vomiting, abdominal pain, diarrhea, constipation, blood in stool, abdominal distention and anal bleeding.  Genitourinary: Negative for dysuria, hematuria, flank pain and difficulty urinating.  Musculoskeletal: Negative for myalgias, back pain, joint swelling, arthralgias and gait problem.  Skin: Negative for color change, pallor, rash and wound.  Neurological: Negative for dizziness, tremors, weakness and light-headedness.  Hematological: Negative for adenopathy. Does not bruise/bleed easily.  Psychiatric/Behavioral: Negative for behavioral problems, confusion, sleep disturbance, dysphoric mood, decreased concentration and agitation.   Social History   Tobacco Use  . Smoking status: Former Smoker    Packs/day: 0.50    Years: 6.00    Pack years: 3.00    Types: Cigarettes    Quit date: 03/16/2001    Years since quitting: 17.4  . Smokeless tobacco: Never Used  . Tobacco comment: "quit smoking ~ 2003"  Substance Use Topics  . Alcohol use: Yes    Comment: drinks bottled beer intermittently  . Drug use: No    Physical Exam   BP 109/73   Pulse (!) 103   Temp 98.1 F (36.7 C) (Oral)   Wt 174 lb (78.9 kg)   BMI 23.60 kg/m   Physical Exam  Constitutional: He is oriented to person, place, and time. He appears well-developed and well-nourished. No distress.  HENT:  Mouth/Throat: Oropharynx is clear and moist. No oropharyngeal exudate.  Cardiovascular: Normal rate, regular rhythm and normal heart sounds. Exam reveals no gallop and no friction rub.  No murmur heard.  Pulmonary/Chest: Effort normal and breath sounds normal. No respiratory distress. He has no wheezes.  Abdominal: Soft. Bowel sounds are normal. He exhibits no distension. There is no tenderness.  Lymphadenopathy:  He has no cervical adenopathy.  Neurological:  He is alert and oriented to person, place, and time.  Skin: Skin is warm and dry. No rash noted. No erythema.  Psychiatric: He has a normal mood and affect. His behavior is normal.    Lab Results  Component Value Date   CD4TCELL 7 (L) 05/18/2018   Lab Results  Component Value Date   CD4TABS 100 (L) 05/18/2018   CD4TABS 110 (L) 10/11/2017   CD4TABS 14 (L) 05/31/2017   Lab Results  Component Value Date   HIV1RNAQUANT 76 (H) 05/18/2018   No results found for: HEPBSAB Lab Results  Component Value Date   LABRPR NON-REACTIVE 05/18/2018    CBC Lab Results  Component Value Date   WBC 8.3 05/18/2018   RBC 3.36 (L) 05/18/2018   HGB 10.9 (L) 05/18/2018   HCT 32.2 (L) 05/18/2018   PLT 277 05/18/2018   MCV 95.8 05/18/2018   MCH 32.4 05/18/2018   MCHC 33.9 05/18/2018   RDW 11.8 05/18/2018   LYMPHSABS 1,502 05/18/2018   MONOABS 0.9 04/23/2016   EOSABS 158 05/18/2018    BMET Lab Results  Component Value Date   NA 139 05/18/2018   K 4.1 05/18/2018   CL 101 05/18/2018   CO2 29 05/18/2018   GLUCOSE 194 (H) 05/18/2018   BUN 14 05/18/2018   CREATININE 1.20 05/18/2018   CALCIUM 9.6 05/18/2018   GFRNONAA 72 05/18/2018   GFRAA 83  05/18/2018      Assessment and Plan hiv disease = well controlled, continue on current regimen  Genital HPV lesions, severe = being evaluated for surgical debulking. He reports that his condition often requires him to be off of work, having difficulty maintaining steady employment  Multiple absences due to co-morbidities = placed in contact with  Wants to apply for disability.

## 2018-08-23 LAB — T-HELPER CELL (CD4) - (RCID CLINIC ONLY)
CD4 % Helper T Cell: 11 % — ABNORMAL LOW (ref 33–65)
CD4 T Cell Abs: 172 /uL — ABNORMAL LOW (ref 400–1790)

## 2018-09-02 LAB — CBC WITH DIFFERENTIAL/PLATELET
Absolute Monocytes: 599 cells/uL (ref 200–950)
Basophils Absolute: 17 cells/uL (ref 0–200)
Basophils Relative: 0.3 %
Eosinophils Absolute: 200 cells/uL (ref 15–500)
Eosinophils Relative: 3.5 %
HCT: 29 % — ABNORMAL LOW (ref 38.5–50.0)
Hemoglobin: 9.3 g/dL — ABNORMAL LOW (ref 13.2–17.1)
Lymphs Abs: 1761 cells/uL (ref 850–3900)
MCH: 30.8 pg (ref 27.0–33.0)
MCHC: 32.1 g/dL (ref 32.0–36.0)
MCV: 96 fL (ref 80.0–100.0)
MPV: 9.3 fL (ref 7.5–12.5)
Monocytes Relative: 10.5 %
Neutro Abs: 3124 cells/uL (ref 1500–7800)
Neutrophils Relative %: 54.8 %
Platelets: 295 10*3/uL (ref 140–400)
RBC: 3.02 10*6/uL — ABNORMAL LOW (ref 4.20–5.80)
RDW: 11.8 % (ref 11.0–15.0)
Total Lymphocyte: 30.9 %
WBC: 5.7 10*3/uL (ref 3.8–10.8)

## 2018-09-02 LAB — COMPLETE METABOLIC PANEL WITH GFR
AG Ratio: 0.8 (calc) — ABNORMAL LOW (ref 1.0–2.5)
ALT: 7 U/L — ABNORMAL LOW (ref 9–46)
AST: 12 U/L (ref 10–40)
Albumin: 3.6 g/dL (ref 3.6–5.1)
Alkaline phosphatase (APISO): 71 U/L (ref 36–130)
BUN/Creatinine Ratio: 25 (calc) — ABNORMAL HIGH (ref 6–22)
BUN: 28 mg/dL — ABNORMAL HIGH (ref 7–25)
CO2: 22 mmol/L (ref 20–32)
Calcium: 10.4 mg/dL — ABNORMAL HIGH (ref 8.6–10.3)
Chloride: 104 mmol/L (ref 98–110)
Creat: 1.14 mg/dL (ref 0.60–1.35)
GFR, Est African American: 88 mL/min/{1.73_m2} (ref >=60)
GFR, Est Non African American: 76 mL/min/{1.73_m2} (ref >=60)
Globulin: 4.6 g/dL (calc) — ABNORMAL HIGH (ref 1.9–3.7)
Glucose, Bld: 109 mg/dL — ABNORMAL HIGH (ref 65–99)
Potassium: 4.7 mmol/L (ref 3.5–5.3)
Sodium: 136 mmol/L (ref 135–146)
Total Bilirubin: 0.3 mg/dL (ref 0.2–1.2)
Total Protein: 8.2 g/dL — ABNORMAL HIGH (ref 6.1–8.1)

## 2018-09-02 LAB — HIV-1 RNA QUANT-NO REFLEX-BLD
HIV 1 RNA Quant: 134 copies/mL — ABNORMAL HIGH
HIV-1 RNA Quant, Log: 2.13 Log copies/mL — ABNORMAL HIGH

## 2018-09-02 LAB — RPR: RPR Ser Ql: NONREACTIVE

## 2018-09-27 ENCOUNTER — Encounter: Payer: Self-pay | Admitting: *Deleted

## 2018-09-28 ENCOUNTER — Telehealth: Payer: Self-pay | Admitting: *Deleted

## 2018-09-28 NOTE — Telephone Encounter (Signed)
Patient was in clinic today to renew his ADAP. He had questions about his next steps for his surgery and was having trouble getting answers. After calling Dr Eveline Keto office and leaving message and Dr Morton Stall office and leaving messages was able to find out that [facility] is waiting for patient to see Dr Morton Stall as he has said he can help remove the skin in affected area but Waters will have to help with the rectal diease. Called Dr Morton Stall office and learned patient has missed 3 visits since May 2020. 2 reschedules and 1 no show. After explaining his is in distress and this really needs to be taken care of (going on since 2018) she scheduled him to see Dr Morton Stall 10/27/18 at 3 pm. Gave address as [place], [place] [address]. She advised she will send him a letter as did I. He must keep this appointment. Letter sent in his language [name].

## 2018-10-19 ENCOUNTER — Telehealth: Payer: Self-pay

## 2018-10-19 NOTE — Telephone Encounter (Signed)
Patient walked into clinic to request a note for social services stating he has not worked in the last 6 months due to his medical condition.  He is applying for food stamps and this request was made.   Per Dr Baxter Flattery ok for letter.   Letter written and given to patient.    Laverle Patter, RN

## 2018-11-10 ENCOUNTER — Emergency Department (HOSPITAL_COMMUNITY): Payer: Self-pay

## 2018-11-10 ENCOUNTER — Inpatient Hospital Stay (HOSPITAL_COMMUNITY)
Admission: EM | Admit: 2018-11-10 | Discharge: 2018-11-15 | DRG: 638 | Disposition: A | Discharge status: Home / Self Care | Payer: Self-pay | Attending: Family Medicine | Admitting: Family Medicine

## 2018-11-10 ENCOUNTER — Other Ambulatory Visit: Payer: Self-pay

## 2018-11-10 DIAGNOSIS — K759 Inflammatory liver disease, unspecified: Secondary | ICD-10-CM | POA: Diagnosis present

## 2018-11-10 DIAGNOSIS — K5900 Constipation, unspecified: Secondary | ICD-10-CM | POA: Diagnosis present

## 2018-11-10 DIAGNOSIS — E871 Hypo-osmolality and hyponatremia: Secondary | ICD-10-CM | POA: Diagnosis present

## 2018-11-10 DIAGNOSIS — Z538 Procedure and treatment not carried out for other reasons: Secondary | ICD-10-CM | POA: Diagnosis not present

## 2018-11-10 DIAGNOSIS — K921 Melena: Secondary | ICD-10-CM | POA: Diagnosis present

## 2018-11-10 DIAGNOSIS — B20 Human immunodeficiency virus [HIV] disease: Secondary | ICD-10-CM | POA: Diagnosis present

## 2018-11-10 DIAGNOSIS — A630 Anogenital (venereal) warts: Secondary | ICD-10-CM | POA: Diagnosis present

## 2018-11-10 DIAGNOSIS — E860 Dehydration: Secondary | ICD-10-CM | POA: Diagnosis present

## 2018-11-10 DIAGNOSIS — Z8249 Family history of ischemic heart disease and other diseases of the circulatory system: Secondary | ICD-10-CM

## 2018-11-10 DIAGNOSIS — Z87891 Personal history of nicotine dependence: Secondary | ICD-10-CM

## 2018-11-10 DIAGNOSIS — E875 Hyperkalemia: Secondary | ICD-10-CM

## 2018-11-10 DIAGNOSIS — Z79899 Other long term (current) drug therapy: Secondary | ICD-10-CM

## 2018-11-10 DIAGNOSIS — R739 Hyperglycemia, unspecified: Secondary | ICD-10-CM

## 2018-11-10 DIAGNOSIS — E1110 Type 2 diabetes mellitus with ketoacidosis without coma: Secondary | ICD-10-CM | POA: Diagnosis present

## 2018-11-10 DIAGNOSIS — Z20828 Contact with and (suspected) exposure to other viral communicable diseases: Secondary | ICD-10-CM | POA: Diagnosis present

## 2018-11-10 DIAGNOSIS — N179 Acute kidney failure, unspecified: Secondary | ICD-10-CM | POA: Diagnosis present

## 2018-11-10 DIAGNOSIS — D509 Iron deficiency anemia, unspecified: Secondary | ICD-10-CM | POA: Diagnosis present

## 2018-11-10 DIAGNOSIS — E876 Hypokalemia: Secondary | ICD-10-CM | POA: Diagnosis present

## 2018-11-10 DIAGNOSIS — Z8611 Personal history of tuberculosis: Secondary | ICD-10-CM

## 2018-11-10 LAB — COMPREHENSIVE METABOLIC PANEL
ALT: 15 U/L (ref 0–44)
AST: 16 U/L (ref 15–41)
Albumin: 3.2 g/dL — ABNORMAL LOW (ref 3.5–5.0)
Alkaline Phosphatase: 178 U/L — ABNORMAL HIGH (ref 38–126)
Anion gap: 17 — ABNORMAL HIGH (ref 5–15)
BUN: 56 mg/dL — ABNORMAL HIGH (ref 6–20)
CO2: 19 mmol/L — ABNORMAL LOW (ref 22–32)
Calcium: 10.1 mg/dL (ref 8.9–10.3)
Chloride: 83 mmol/L — ABNORMAL LOW (ref 98–111)
Creatinine, Ser: 1.93 mg/dL — ABNORMAL HIGH (ref 0.61–1.24)
GFR calc Af Amer: 47 mL/min — ABNORMAL LOW (ref >60)
GFR calc non Af Amer: 40 mL/min — ABNORMAL LOW (ref >60)
Glucose, Bld: 1196 mg/dL (ref 70–99)
Potassium: 6.3 mmol/L (ref 3.5–5.1)
Sodium: 119 mmol/L — CL (ref 135–145)
Total Bilirubin: 0.2 mg/dL — ABNORMAL LOW (ref 0.3–1.2)
Total Protein: 8.6 g/dL — ABNORMAL HIGH (ref 6.5–8.1)

## 2018-11-10 LAB — URINALYSIS, ROUTINE W REFLEX MICROSCOPIC
Bacteria, UA: NONE SEEN
Bilirubin Urine: NEGATIVE
Glucose, UA: >=500 mg/dL — AB
Ketones, ur: NEGATIVE mg/dL
Leukocytes,Ua: NEGATIVE
Nitrite: NEGATIVE
Protein, ur: NEGATIVE mg/dL
Specific Gravity, Urine: 1.024 (ref 1.005–1.030)
pH: 6 (ref 5.0–8.0)

## 2018-11-10 LAB — CBC
HCT: 28.8 % — ABNORMAL LOW (ref 39.0–52.0)
Hemoglobin: 7.7 g/dL — ABNORMAL LOW (ref 13.0–17.0)
MCH: 21.3 pg — ABNORMAL LOW (ref 26.0–34.0)
MCHC: 26.7 g/dL — ABNORMAL LOW (ref 30.0–36.0)
MCV: 79.6 fL — ABNORMAL LOW (ref 80.0–100.0)
Platelets: 312 10*3/uL (ref 150–400)
RBC: 3.62 MIL/uL — ABNORMAL LOW (ref 4.22–5.81)
RDW: 16.9 % — ABNORMAL HIGH (ref 11.5–15.5)
WBC: 8.7 10*3/uL (ref 4.0–10.5)
nRBC: 0.2 % (ref 0.0–0.2)

## 2018-11-10 LAB — CBG MONITORING, ED
Glucose-Capillary: >600 mg/dL (ref 70–99)
Glucose-Capillary: >600 mg/dL (ref 70–99)
Glucose-Capillary: >600 mg/dL (ref 70–99)

## 2018-11-10 LAB — LIPASE, BLOOD: Lipase: 91 U/L — ABNORMAL HIGH (ref 11–51)

## 2018-11-10 LAB — SARS CORONAVIRUS 2 BY RT PCR (HOSPITAL ORDER, PERFORMED IN ~~LOC~~ HOSPITAL LAB): SARS Coronavirus 2: NEGATIVE

## 2018-11-10 MED ORDER — SODIUM CHLORIDE 0.9 % IV BOLUS
1000.0000 mL | Freq: Once | INTRAVENOUS | Status: AC
  Administered 2018-11-10: 1000 mL via INTRAVENOUS

## 2018-11-10 MED ORDER — SODIUM CHLORIDE 0.9 % IV SOLN
INTRAVENOUS | Status: DC
  Administered 2018-11-10 – 2018-11-11 (×2): via INTRAVENOUS

## 2018-11-10 MED ORDER — SODIUM CHLORIDE 0.9% FLUSH
3.0000 mL | Freq: Once | INTRAVENOUS | Status: AC
  Administered 2018-11-10: 3 mL via INTRAVENOUS

## 2018-11-10 MED ORDER — INSULIN REGULAR(HUMAN) IN NACL 100-0.9 UT/100ML-% IV SOLN
INTRAVENOUS | Status: DC
  Administered 2018-11-10: 5.4 [IU]/h via INTRAVENOUS
  Filled 2018-11-10: qty 100

## 2018-11-10 NOTE — ED Notes (Signed)
Lab called critical values sodium 119, potassium 6.3, glucose 1196 reported to Dr Rex Kras.

## 2018-11-10 NOTE — ED Triage Notes (Signed)
Patient reports kidney pain x 1 week - states pain is all over his back, feels like his kidneys are acting. Hx HIV - compliant with medications, no relief with at home pain medication. Also endorses N/V any time he eats and he feels generally weak. Denies fevers/chills. Also endorses blood in urine. Pain worse with movement. Ambulatory with steady gait.

## 2018-11-10 NOTE — ED Provider Notes (Signed)
[facility] EMERGENCY [facility]PART[facility] Provider Note   CSN: [id number] Arrival date & time: 11/10/18  1756     History   Chief Complaint Chief Complaint  Patient presents with   Kidney Pain   Emesis    HPI [name] is a 48 y.o. male.  Past medical history HIV, reports compliance with medications, most recent CD4 count was 172.  Presents emergency department with complaint nausea, emesis and flank pain.  States that he has been having occasional episodes of emesis, nonbloody nonbilious.  States he has not had any abdominal pain but has had pain on both of his sides, but he attributes to his kidneys.  No fever, has noted mild cough.  Used language line.  Per chart review, no prior history of diabetes mellitus, has history of HIV, most recent CD4 count, 172.     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/24/2016   Chronic systolic CHF (congestive heart failure) ([name]) 04/24/2016   AKI (acute kidney injury) ([name]) 04/24/2016   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)    Hypoxia    Acute respiratory failure with hypoxia ([name]) 02/25/2016   Normochromic normocytic anemia 02/25/2016   Pneumonia 02/25/2016   Protein-calorie malnutrition, severe 02/25/2016   PE (pulmonary thromboembolism) ([name]) 02/24/2016   Renal insufficiency    Surgery, elective    AIDS (acquired immune deficiency  syndrome) ([name])    S/P ORIF (open reduction internal fixation) fracture 03/21/2014   Low back pain 10/10/2013   Multifocal pneumonia 09/27/2013   HIV (human immunodeficiency virus infection) ([name]) 09/18/2013   Refugee health examination 09/18/2013   Cough 09/18/2013    Past Surgical History:  Procedure Laterality Date   FRACTURE SURGERY     IM NAILING TIBIA Right 03/21/2014   ORIF ANKLE FRACTURE Right 03/21/2014   lateral malleolus/notes 03/21/2014   ORIF ANKLE FRACTURE Right 03/21/2014   Procedure: OPEN REDUCTION INTERNAL FIXATION (ORIF) pilon ;  Surgeon: Eldred MangesMark C Yates, MD;  Location: [facility] OR;  Service: Orthopedics;  Laterality: Right;   TIBIA IM NAIL INSERTION Right 03/21/2014   Procedure: INTRAMEDULLARY (IM) NAIL TIBIAL;  Surgeon: Eldred MangesMark C Yates, MD;  Location: [facility] OR;  Service: Orthopedics;  Laterality: Right;   VIDEO BRONCHOSCOPY Bilateral 03/02/2016   Procedure: VIDEO BRONCHOSCOPY WITHOUT FLUORO;  Surgeon: Roslynn AmbleJennings E Nestor, MD;  [facility];  Service: Cardiopulmonary;  Laterality: Bilateral;        Home Medications    Prior to Admission medications   Medication Sig Start Date End Date Taking? Authorizing Provider  Darunavir-Cobicisctat-Emtricitabine-Tenofovir Alafenamide (SYMTUZA) 800-150-200-10 MG TABS Take 1 tablet by mouth daily with breakfast. 06/06/18  Yes Kuppelweiser, Cassie L, RPH-CPP  dolutegravir (TIVICAY) 50 MG tablet Take 1 tablet (50 mg total) by mouth daily. 06/06/18  Yes Kuppelweiser, Cassie L, RPH-CPP  lidocaine (XYLOCAINE) 5 % ointment Apply 1 application topically 3 (three) times daily as needed.  Apply over genital area for pain relief. Patient taking differently: Apply 1 application topically 3 (three) times daily as needed for mild pain.  04/26/18  Yes Cardama, Amadeo GarnetPedro Eduardo, MD    Family History Family History  Problem Relation Age of Onset   Hypertension Other    Heart disease Sister     Social History Social History   Tobacco Use    Smoking status: Former Smoker    Packs/day: 0.50    Years: 6.00    Pack years: 3.00    Types: Cigarettes    Quit date: 03/16/2001    Years since quitting: 17.6   Smokeless tobacco: Never Used   Tobacco comment: "quit smoking ~ 2003"  Substance Use Topics   Alcohol use: Yes    Comment: drinks bottled beer intermittently   Drug use: No     Allergies   Patient has no known allergies.   Review of Systems Review of Systems  Constitutional: Negative for chills and fever.  HENT: Negative for ear pain and sore throat.   Eyes: Negative for pain and visual disturbance.  Respiratory: Negative for cough and shortness of breath.   Cardiovascular: Negative for chest pain and palpitations.  Gastrointestinal: Positive for nausea and vomiting. Negative for abdominal pain.  Genitourinary: Negative for dysuria and hematuria.  Musculoskeletal: Negative for arthralgias and back pain.  Skin: Negative for color change and rash.  Neurological: Negative for seizures and syncope.  All other systems reviewed and are negative.    Physical Exam Updated Vital Signs BP (!) 109/91    Pulse (!) 105    Temp 98.2 F (36.8 C) (Oral)    Resp 18    SpO2 94%   Physical Exam Constitutional:      Comments: Disheveled, chronically ill-appearing, somewhat cachectic, no acute distress  HENT:     Head: Normocephalic and atraumatic.     Mouth/Throat:     Mouth: Mucous membranes are dry.     Pharynx: Oropharynx is clear.  Eyes:     Extraocular Movements: Extraocular movements intact.     Pupils: Pupils are equal, round, and reactive to light.  Cardiovascular:     Rate and Rhythm: Regular rhythm. Tachycardia present.     Pulses: Normal pulses.     Heart sounds: Normal heart sounds.  Pulmonary:     Effort: Pulmonary effort is normal. No respiratory distress.     Breath sounds: Normal breath sounds.  Abdominal:     General: Abdomen is flat.     Palpations: Abdomen is soft.  Musculoskeletal:         General: No swelling or tenderness.  Skin:    General: Skin is warm and dry.     Capillary Refill: Capillary refill takes less than 2 seconds.  Neurological:     General: No focal deficit present.     Mental Status: He is oriented to person, place, and time.      ED Treatments / Results  Labs (all labs ordered are listed, but only abnormal results are displayed) Labs Reviewed  LIPASE, BLOOD - Abnormal; Notable for the following components:      Result Value   Lipase 91 (*)    All other components within normal limits  COMPREHENSIVE METABOLIC PANEL - Abnormal; Notable for the following components:   Sodium 119 (*)    Potassium 6.3 (*)    Chloride 83 (*)    CO2 19 (*)    Glucose, Bld 1,196 (*)    BUN  56 (*)    Creatinine, Ser 1.93 (*)    Total Protein 8.6 (*)    Albumin 3.2 (*)    Alkaline Phosphatase 178 (*)    Total Bilirubin 0.2 (*)    GFR calc non Af Amer 40 (*)    GFR calc Af Amer 47 (*)    Anion gap 17 (*)    All other components within normal limits  CBC - Abnormal; Notable for the following components:   RBC 3.62 (*)    Hemoglobin 7.7 (*)    HCT 28.8 (*)    MCV 79.6 (*)    MCH 21.3 (*)    MCHC 26.7 (*)    RDW 16.9 (*)    All other components within normal limits  URINALYSIS, ROUTINE W REFLEX MICROSCOPIC - Abnormal; Notable for the following components:   Color, Urine STRAW (*)    Glucose, UA >=500 (*)    Hgb urine dipstick SMALL (*)    All other components within normal limits  CBG MONITORING, ED - Abnormal; Notable for the following components:   Glucose-Capillary >600 (*)    All other components within normal limits  CBG MONITORING, ED - Abnormal; Notable for the following components:   Glucose-Capillary >600 (*)    All other components within normal limits  CBG MONITORING, ED - Abnormal; Notable for the following components:   Glucose-Capillary >600 (*)    All other components within normal limits  SARS CORONAVIRUS 2 (HOSPITAL ORDER, PERFORMED IN  [facility])  RENAL FUNCTION PANEL  MAGNESIUM  CBC WITH DIFFERENTIAL/PLATELET    EKG EKG Interpretation  Date/Time:  Thursday November 10 2018 20:36:12 EDT Ventricular Rate:  114 PR Interval:  132 QRS Duration: 87 QT Interval:  316 QTC Calculation: 436 R Axis:   -24 Text Interpretation:  Sinus tachycardia Borderline left axis deviation When compared with ECG of EARLIER SAME DATE No significant change was found Confirmed by Dione Booze ([id number]) on 11/11/2018 12:02:51 AM   Radiology Ct Abdomen Pelvis Wo Contrast  Result Date: 11/10/2018 CLINICAL DATA:  48 year old male with left-sided back pain. Acute renal failure. Concern for kidney stone. EXAM: CT ABDOMEN AND PELVIS WITHOUT CONTRAST TECHNIQUE: Multidetector CT imaging of the abdomen and pelvis was performed following the standard protocol without IV contrast. COMPARISON:  None. FINDINGS: Evaluation of this exam is limited in the absence of intravenous contrast. Evaluation is also limited due to respiratory motion artifact. Lower chest: Moderate bronchiectatic changes in the left lower lobe. There is hypoattenuation of the cardiac blood pool suggestive of a degree of anemia. Clinical correlation is recommended. No intra-abdominal free air or free fluid Hepatobiliary: No focal liver abnormality is seen. No gallstones, gallbladder wall thickening, or biliary dilatation. Pancreas: Unremarkable. No pancreatic ductal dilatation or surrounding inflammatory changes. Spleen: Normal in size without focal abnormality. Adrenals/Urinary Tract: Adrenal glands are unremarkable. Kidneys are normal, without renal calculi, focal lesion, or hydronephrosis. Bladder is unremarkable. Stomach/Bowel: There is moderate stool throughout the colon. There is no bowel obstruction or active inflammation. Normal appendix. Vascular/Lymphatic: The abdominal aorta and IVC are unremarkable. Minimal atherosclerotic calcification of the iliac arteries. No portal venous  gas. There is no adenopathy. Reproductive: The prostate and seminal vesicles are grossly unremarkable. No pelvic mass. Other: None Musculoskeletal: No acute or significant osseous findings. IMPRESSION: 1. No acute intra-abdominal or pelvic pathology. No hydronephrosis or nephrolithiasis. 2. Moderate colonic stool burden. No bowel obstruction or active inflammation. Normal appendix. Electronically Signed   By: Elgie Collard  M.D.   On: 11/10/2018 22:30   Dg Chest Portable 1 View  Result Date: 11/10/2018 CLINICAL DATA:  Cough and shortness of breath EXAM: PORTABLE CHEST 1 VIEW COMPARISON:  11/21/2017 and prior radiographs FINDINGS: The cardiomediastinal silhouette is unremarkable. Chronic pulmonary changes/scarring within the LOWER lungs, LEFT-greater-than-RIGHT, again noted. There is no evidence of focal airspace disease, pulmonary edema, suspicious pulmonary nodule/mass, pleural effusion, or pneumothorax. No acute bony abnormalities are identified. IMPRESSION: No evidence of acute cardiopulmonary disease. Electronically Signed   By: Margarette Canada M.D.   On: 11/10/2018 21:19    Procedures .Critical Care Performed by: Lucrezia Starch, MD Authorized by: Lucrezia Starch, MD   Critical care provider statement:    Critical care time (minutes):  33   Critical care was time spent personally by me on the following activities:  Discussions with consultants, evaluation of patient's response to treatment, examination of patient, ordering and performing treatments and interventions, ordering and review of laboratory studies, ordering and review of radiographic studies, pulse oximetry, re-evaluation of patient's condition, obtaining history from patient or surrogate and review of old charts   (including critical care time)  Medications Ordered in ED Medications  insulin regular, human (MYXREDLIN) 100 units/ 100 mL infusion ( Intravenous Rate/Dose Verify 11/10/18 2345)  0.9 %  sodium chloride infusion (  Intravenous New Bag/Given 11/10/18 2125)  sodium chloride flush (NS) 0.9 % injection 3 mL (3 mLs Intravenous Given 11/10/18 2122)  sodium chloride 0.9 % bolus 1,000 mL (1,000 mLs Intravenous New Bag/Given 11/10/18 2122)  sodium chloride 0.9 % bolus 1,000 mL (0 mLs Intravenous Stopped 11/10/18 2343)     Initial Impression / Assessment and Plan / ED Course  I have reviewed the triage vital signs and the nursing notes.  Pertinent labs & imaging results that were available during my care of the patient were reviewed by me and considered in my medical decision making (see chart for details).  Clinical Course as of Nov 11 34  Thu Nov 10, 2018  2331 Discussed with hospitalist who will admit patient   [RD]    Clinical Course User Index [RD] Lucrezia Starch, MD      48 year old HIV no prior history of diabetes presents the ER with nausea, emesis and flank pain.  On exam, chronically ill-appearing but no acute distress.  Tachycardia with stable blood pressure.  Labs concerning for profound hyperglycemia to 1100, borderline acidosis.  HHS versus DKA.  Mild hyperK without EKG changes - with fluids, insulin will likely drop significantly.  Corrected sodium okay. Checked CT abdomen pelvis given this reported emesis and flank pain however this was without acute findings.  Gave patient 2 L of fluid bolus followed by continuous fluids and started insulin infusion.  Admitted to hospitalist for further management.  Final Clinical Impressions(s) / ED Diagnoses   Final diagnoses:  Diabetic ketoacidosis without coma associated with type 2 diabetes mellitus ([place])  Hyperglycemia  Hyperkalemia    ED Discharge Orders    None       Lucrezia Starch, MD 11/11/18 [phone]

## 2018-11-10 NOTE — H&P (Signed)
History and Physical  ISABEL FREESE JSH:[id number] DOB: 1970/10/22 DOA: 11/10/2018  Referring physician: ER provider PCP: Dickie La, MD  Outpatient Specialists: Infectious disease team for HIV Patient coming from: Home  Chief Complaint: Nausea and vomiting  HPI:  Patient is a 48 year old African-American male with past medical history significant for HIV with last CD4 documented as 172, tuberculosis, hepatitis, genital warts and depression.  Patient speaks mainly [name].  Patient presents with 2-day history of nausea and vomiting.  On further questioning, patient reports that he has been feeling weak, tired and dyspneic with minimal exertion for over 1 month.  No associated headache, neck pain, fever chills, shortness of breath or urinary symptoms.  Patient reports being constipated, with non-specific lower abdominal discomfort.  On presentation to the hospital, patient was tachycardic, with heart rate of 105 to 122 bpm, sodium was 119, potassium of 6.3, CO2 of 19, BUN of 56, serum creatinine of 1.93 baseline of 1.14), blood sugar of 1196 and anion gap of 17.  Hemoglobin  was 7.7, down from 9.3 g/dL 2 months ago and lipase of 91.  No prior documentation of diabetes mellitus.  CT scan of the abdomen and pelvis revealed moderate colonic stool burden, otherwise, no other significant findings reported.  Hospitalist team has been asked to admit patient for further assessment and management.  ED Course: On presentation, patient was afebrile, tachycardic, with respiratory rate of 19/min and blood pressure 123/84 mmHg.  Pertinent labs as documented above.  Patient is currently being managed for DKA.  Hospitalist team has been asked to assume care.  Pertinent labs: As documented above.  EKG: Independently reviewed.   Imaging: independently reviewed.   Review of Systems:  Negative for fever, visual changes, sore throat, rash, new muscle aches, chest pain, dysuria, bleeding.  Past Medical  History:  Diagnosis Date   Depression    "stress and depression for any man is common" (03/21/2014)   Genital warts 01/04/2017   Hepatitis    "I don't know what hepatitis I have"   HIV disease ([place])    TB (pulmonary tuberculosis)    previously treated according to refugee documentation    Past Surgical History:  Procedure Laterality Date   FRACTURE SURGERY     IM NAILING TIBIA Right 03/21/2014   ORIF ANKLE FRACTURE Right 03/21/2014   lateral malleolus/notes 03/21/2014   ORIF ANKLE FRACTURE Right 03/21/2014   Procedure: OPEN REDUCTION INTERNAL FIXATION (ORIF) pilon ;  Surgeon: Marybelle Killings, MD;  Location: [facility];  Service: Orthopedics;  Laterality: Right;   TIBIA IM NAIL INSERTION Right 03/21/2014   Procedure: INTRAMEDULLARY (IM) NAIL TIBIAL;  Surgeon: Marybelle Killings, MD;  Location: [facility];  Service: Orthopedics;  Laterality: Right;   VIDEO BRONCHOSCOPY Bilateral 03/02/2016   Procedure: VIDEO BRONCHOSCOPY WITHOUT FLUORO;  Surgeon: Javier Glazier, MD;  Location: [facility];  Service: Cardiopulmonary;  Laterality: Bilateral;     reports that he quit smoking about 17 years ago. His smoking use included cigarettes. He has a 3.00 pack-year smoking history. He has never used smokeless tobacco. He reports current alcohol use. He reports that he does not use drugs.  No Known Allergies  Family History  Problem Relation Age of Onset   Hypertension Other    Heart disease Sister      Prior to Admission medications   Medication Sig Start Date End Date Taking? Authorizing Provider  Darunavir-Cobicisctat-Emtricitabine-Tenofovir Alafenamide (SYMTUZA) 800-150-200-10 MG TABS Take 1 tablet by mouth daily with breakfast. 06/06/18  Yes Kuppelweiser, Cassie L, RPH-CPP  dolutegravir (TIVICAY) 50 MG tablet Take 1 tablet (50 mg total) by mouth daily. 06/06/18  Yes Kuppelweiser, Cassie L, RPH-CPP  lidocaine (XYLOCAINE) 5 % ointment Apply 1 application topically 3 (three) times daily as needed. Apply  over genital area for pain relief. Patient taking differently: Apply 1 application topically 3 (three) times daily as needed for mild pain.  04/26/18  Yes Nira Conn, MD    Physical Exam: Vitals:   11/10/18 2045 11/10/18 2126 11/10/18 2130 11/10/18 2230  BP:  126/77 131/90 123/84  Pulse: (!) 107 (!) 105 (!) 107 (!) 110  Resp: (!) 23 (!) 24 (!) 25 19  Temp:      TempSrc:      SpO2:  92% 90% 92%    Constitutional:    Appears calm and comfortable.  Patient is thin. Eyes:   Pallor. No jaundice.  ENMT:   external ears, nose appear normal Neck:   Neck is supple. No JVD Respiratory:   CTA bilaterally, no w/r/r.   Respiratory effort normal. No retractions or accessory muscle use Cardiovascular:   S1S2  No LE extremity edema   Abdomen:   Abdomen is soft and non tender. Organs are difficult to assess. Neurologic:   Awake and alert.  Moves all limbs.  Wt Readings from Last 3 Encounters:  08/22/18 78.9 kg  05/18/18 74.8 kg  10/11/17 84.4 kg    I have personally reviewed following labs and imaging studies  Labs on Admission:  CBC: Recent Labs  Lab 11/10/18 1820  WBC 8.7  HGB 7.7*  HCT 28.8*  MCV 79.6*  PLT 312   Basic Metabolic Panel: Recent Labs  Lab 11/10/18 1820  NA 119*  K 6.3*  CL 83*  CO2 19*  GLUCOSE 1,196*  BUN 56*  CREATININE 1.93*  CALCIUM 10.1   Liver Function Tests: Recent Labs  Lab 11/10/18 1820  AST 16  ALT 15  ALKPHOS 178*  BILITOT 0.2*  PROT 8.6*  ALBUMIN 3.2*   Recent Labs  Lab 11/10/18 1820  LIPASE 91*   No results for input(s): AMMONIA in the last 168 hours. Coagulation Profile: No results for input(s): INR, PROTIME in the last 168 hours. Cardiac Enzymes: No results for input(s): CKTOTAL, CKMB, CKMBINDEX, TROPONINI in the last 168 hours. BNP (last 3 results) No results for input(s): PROBNP in the last 8760 hours. HbA1C: No results for input(s): HGBA1C in the last 72 hours. CBG: Recent Labs  Lab  11/10/18 2121 11/10/18 2234  GLUCAP >600* >600*   Lipid Profile: No results for input(s): CHOL, HDL, LDLCALC, TRIG, CHOLHDL, LDLDIRECT in the last 72 hours. Thyroid Function Tests: No results for input(s): TSH, T4TOTAL, FREET4, T3FREE, THYROIDAB in the last 72 hours. Anemia Panel: No results for input(s): VITAMINB12, FOLATE, FERRITIN, TIBC, IRON, RETICCTPCT in the last 72 hours. Urine analysis:    Component Value Date/Time   COLORURINE STRAW (A) 11/10/2018 2139   APPEARANCEUR CLEAR 11/10/2018 2139   LABSPEC 1.024 11/10/2018 2139   PHURINE 6.0 11/10/2018 2139   GLUCOSEU >=500 (A) 11/10/2018 2139   HGBUR SMALL (A) 11/10/2018 2139   BILIRUBINUR NEGATIVE 11/10/2018 2139   KETONESUR NEGATIVE 11/10/2018 2139   PROTEINUR NEGATIVE 11/10/2018 2139   UROBILINOGEN 1.0 03/23/2014 1400   NITRITE NEGATIVE 11/10/2018 2139   LEUKOCYTESUR NEGATIVE 11/10/2018 2139   Sepsis Labs: @LABRCNTIP (procalcitonin:4,lacticidven:4) )No results found for this or any previous visit (from the past 240 hour(s)).    Radiological Exams on Admission: Ct  Abdomen Pelvis Wo Contrast  Result Date: 11/10/2018 CLINICAL DATA:  48 year old male with left-sided back pain. Acute renal failure. Concern for kidney stone. EXAM: CT ABDOMEN AND PELVIS WITHOUT CONTRAST TECHNIQUE: Multidetector CT imaging of the abdomen and pelvis was performed following the standard protocol without IV contrast. COMPARISON:  None. FINDINGS: Evaluation of this exam is limited in the absence of intravenous contrast. Evaluation is also limited due to respiratory motion artifact. Lower chest: Moderate bronchiectatic changes in the left lower lobe. There is hypoattenuation of the cardiac blood pool suggestive of a degree of anemia. Clinical correlation is recommended. No intra-abdominal free air or free fluid Hepatobiliary: No focal liver abnormality is seen. No gallstones, gallbladder wall thickening, or biliary dilatation. Pancreas: Unremarkable. No  pancreatic ductal dilatation or surrounding inflammatory changes. Spleen: Normal in size without focal abnormality. Adrenals/Urinary Tract: Adrenal glands are unremarkable. Kidneys are normal, without renal calculi, focal lesion, or hydronephrosis. Bladder is unremarkable. Stomach/Bowel: There is moderate stool throughout the colon. There is no bowel obstruction or active inflammation. Normal appendix. Vascular/Lymphatic: The abdominal aorta and IVC are unremarkable. Minimal atherosclerotic calcification of the iliac arteries. No portal venous gas. There is no adenopathy. Reproductive: The prostate and seminal vesicles are grossly unremarkable. No pelvic mass. Other: None Musculoskeletal: No acute or significant osseous findings. IMPRESSION: 1. No acute intra-abdominal or pelvic pathology. No hydronephrosis or nephrolithiasis. 2. Moderate colonic stool burden. No bowel obstruction or active inflammation. Normal appendix. Electronically Signed   By: Elgie CollardArash  Radparvar M.D.   On: 11/10/2018 22:30   Dg Chest Portable 1 View  Result Date: 11/10/2018 CLINICAL DATA:  Cough and shortness of breath EXAM: PORTABLE CHEST 1 VIEW COMPARISON:  11/21/2017 and prior radiographs FINDINGS: The cardiomediastinal silhouette is unremarkable. Chronic pulmonary changes/scarring within the LOWER lungs, LEFT-greater-than-RIGHT, again noted. There is no evidence of focal airspace disease, pulmonary edema, suspicious pulmonary nodule/mass, pleural effusion, or pneumothorax. No acute bony abnormalities are identified. IMPRESSION: No evidence of acute cardiopulmonary disease. Electronically Signed   By: Harmon PierJeffrey  Hu M.D.   On: 11/10/2018 21:19    EKG: Independently reviewed.   Active Problems:   DKA (diabetic ketoacidoses) ([name])   Assessment/Plan New onset diabetes mellitus presented with DKA: Admit patient to progressive unit Continue insulin drip Continue aggressive hydration Manage patient with DKA order set Monitor renal  function and electrolytes Consult diabetes coordinator Further management depend on hospital course.  Anemia: Suspect blood loss anemia. Check iron studies Stool occult blood daily x 3 Check B12 and folate Low threshold to transfuse patient with packed red blood cell Low threshold to consult GI for possible colonoscopy Further management will depend on hospital course  HIV: Continue current medication.  Acute kidney injury: Likely related to volume depletion. Monitor renal function and electrolytes Repeat BMP after adequate hydration Other management depend on hospital course  Significant constipation: Colace MiraLAX Consider enema.  Volume depletion: Replete.  Further management depend on hospital course  Code Status: Full code Family Communication:  Disposition Plan: Home eventually Consults called: Low threshold to consult GI if stool occult blood comes back positive Admission status: Patient  Time spent: 65 minutes  Berton MountSylvester Jashanti Clinkscale, MD  [facility] Pager #: [phone] 7PM-7AM contact night coverage as above  11/10/2018, 11:39 PM

## 2018-11-11 DIAGNOSIS — N179 Acute kidney failure, unspecified: Secondary | ICD-10-CM

## 2018-11-11 DIAGNOSIS — E860 Dehydration: Secondary | ICD-10-CM

## 2018-11-11 DIAGNOSIS — B20 Human immunodeficiency virus [HIV] disease: Secondary | ICD-10-CM

## 2018-11-11 DIAGNOSIS — Z87891 Personal history of nicotine dependence: Secondary | ICD-10-CM

## 2018-11-11 DIAGNOSIS — E876 Hypokalemia: Secondary | ICD-10-CM

## 2018-11-11 DIAGNOSIS — Z79899 Other long term (current) drug therapy: Secondary | ICD-10-CM

## 2018-11-11 DIAGNOSIS — M549 Dorsalgia, unspecified: Secondary | ICD-10-CM

## 2018-11-11 DIAGNOSIS — E872 Acidosis: Secondary | ICD-10-CM

## 2018-11-11 LAB — BASIC METABOLIC PANEL
Anion gap: 12 (ref 5–15)
Anion gap: 9 (ref 5–15)
BUN: 50 mg/dL — ABNORMAL HIGH (ref 6–20)
BUN: 41 mg/dL — ABNORMAL HIGH (ref 6–20)
CO2: 24 mmol/L (ref 22–32)
CO2: 24 mmol/L (ref 22–32)
Calcium: 10.8 mg/dL — ABNORMAL HIGH (ref 8.9–10.3)
Calcium: 10 mg/dL (ref 8.9–10.3)
Chloride: 103 mmol/L (ref 98–111)
Chloride: 105 mmol/L (ref 98–111)
Creatinine, Ser: 1.57 mg/dL — ABNORMAL HIGH (ref 0.61–1.24)
Creatinine, Ser: 1.28 mg/dL — ABNORMAL HIGH (ref 0.61–1.24)
GFR calc Af Amer: 60 mL/min — ABNORMAL LOW (ref >60)
GFR calc Af Amer: >60 mL/min (ref >60)
GFR calc non Af Amer: 52 mL/min — ABNORMAL LOW (ref >60)
GFR calc non Af Amer: >60 mL/min (ref >60)
Glucose, Bld: 362 mg/dL — ABNORMAL HIGH (ref 70–99)
Glucose, Bld: 156 mg/dL — ABNORMAL HIGH (ref 70–99)
Potassium: 4 mmol/L (ref 3.5–5.1)
Potassium: 3.4 mmol/L — ABNORMAL LOW (ref 3.5–5.1)
Sodium: 139 mmol/L (ref 135–145)
Sodium: 138 mmol/L (ref 135–145)

## 2018-11-11 LAB — RENAL FUNCTION PANEL
Albumin: 3 g/dL — ABNORMAL LOW (ref 3.5–5.0)
Anion gap: 10 (ref 5–15)
BUN: 54 mg/dL — ABNORMAL HIGH (ref 6–20)
CO2: 24 mmol/L (ref 22–32)
Calcium: 10.1 mg/dL (ref 8.9–10.3)
Chloride: 104 mmol/L (ref 98–111)
Creatinine, Ser: 1.69 mg/dL — ABNORMAL HIGH (ref 0.61–1.24)
GFR calc Af Amer: 55 mL/min — ABNORMAL LOW (ref >60)
GFR calc non Af Amer: 47 mL/min — ABNORMAL LOW (ref >60)
Glucose, Bld: 606 mg/dL (ref 70–99)
Phosphorus: 2.8 mg/dL (ref 2.5–4.6)
Potassium: 4.1 mmol/L (ref 3.5–5.1)
Sodium: 138 mmol/L (ref 135–145)

## 2018-11-11 LAB — CBC WITH DIFFERENTIAL/PLATELET
Abs Immature Granulocytes: 0.06 10*3/uL (ref 0.00–0.07)
Basophils Absolute: 0 10*3/uL (ref 0.0–0.1)
Basophils Relative: 0 %
Eosinophils Absolute: 0 10*3/uL (ref 0.0–0.5)
Eosinophils Relative: 0 %
HCT: 25.1 % — ABNORMAL LOW (ref 39.0–52.0)
Hemoglobin: 7.5 g/dL — ABNORMAL LOW (ref 13.0–17.0)
Immature Granulocytes: 1 %
Lymphocytes Relative: 11 %
Lymphs Abs: 1 10*3/uL (ref 0.7–4.0)
MCH: 21.7 pg — ABNORMAL LOW (ref 26.0–34.0)
MCHC: 29.9 g/dL — ABNORMAL LOW (ref 30.0–36.0)
MCV: 72.5 fL — ABNORMAL LOW (ref 80.0–100.0)
Monocytes Absolute: 0.6 10*3/uL (ref 0.1–1.0)
Monocytes Relative: 7 %
Neutro Abs: 7.2 10*3/uL (ref 1.7–7.7)
Neutrophils Relative %: 81 %
Platelets: 303 10*3/uL (ref 150–400)
RBC: 3.46 MIL/uL — ABNORMAL LOW (ref 4.22–5.81)
RDW: 15.6 % — ABNORMAL HIGH (ref 11.5–15.5)
WBC: 9 10*3/uL (ref 4.0–10.5)
nRBC: 0 % (ref 0.0–0.2)

## 2018-11-11 LAB — GLUCOSE, CAPILLARY
Glucose-Capillary: 343 mg/dL — ABNORMAL HIGH (ref 70–99)
Glucose-Capillary: 277 mg/dL — ABNORMAL HIGH (ref 70–99)
Glucose-Capillary: 188 mg/dL — ABNORMAL HIGH (ref 70–99)
Glucose-Capillary: 173 mg/dL — ABNORMAL HIGH (ref 70–99)
Glucose-Capillary: 183 mg/dL — ABNORMAL HIGH (ref 70–99)
Glucose-Capillary: 159 mg/dL — ABNORMAL HIGH (ref 70–99)
Glucose-Capillary: 149 mg/dL — ABNORMAL HIGH (ref 70–99)
Glucose-Capillary: 104 mg/dL — ABNORMAL HIGH (ref 70–99)
Glucose-Capillary: 136 mg/dL — ABNORMAL HIGH (ref 70–99)
Glucose-Capillary: 335 mg/dL — ABNORMAL HIGH (ref 70–99)
Glucose-Capillary: 232 mg/dL — ABNORMAL HIGH (ref 70–99)

## 2018-11-11 LAB — IRON AND TIBC
Iron: 10 ug/dL — ABNORMAL LOW (ref 45–182)
Saturation Ratios: 2 % — ABNORMAL LOW (ref 17.9–39.5)
TIBC: 407 ug/dL (ref 250–450)
UIBC: 397 ug/dL

## 2018-11-11 LAB — MAGNESIUM
Magnesium: 2.7 mg/dL — ABNORMAL HIGH (ref 1.7–2.4)
Magnesium: 2.7 mg/dL — ABNORMAL HIGH (ref 1.7–2.4)

## 2018-11-11 LAB — PHOSPHORUS: Phosphorus: 3.5 mg/dL (ref 2.5–4.6)

## 2018-11-11 LAB — CBG MONITORING, ED: Glucose-Capillary: 446 mg/dL — ABNORMAL HIGH (ref 70–99)

## 2018-11-11 LAB — TSH: TSH: 1.216 u[IU]/mL (ref 0.350–4.500)

## 2018-11-11 LAB — FERRITIN: Ferritin: 7 ng/mL — ABNORMAL LOW (ref 24–336)

## 2018-11-11 LAB — VITAMIN B12: Vitamin B-12: 794 pg/mL (ref 180–914)

## 2018-11-11 MED ORDER — POTASSIUM CHLORIDE 10 MEQ/100ML IV SOLN
10.0000 meq | INTRAVENOUS | Status: AC
  Administered 2018-11-11: 10 meq via INTRAVENOUS
  Filled 2018-11-11: qty 100

## 2018-11-11 MED ORDER — DEXTROSE-NACL 5-0.45 % IV SOLN
INTRAVENOUS | Status: DC
  Administered 2018-11-11: 04:00:00 via INTRAVENOUS

## 2018-11-11 MED ORDER — GUAIFENESIN-DM 100-10 MG/5ML PO SYRP
5.0000 mL | ORAL_SOLUTION | ORAL | Status: DC | PRN
  Administered 2018-11-11 – 2018-11-12 (×3): 5 mL via ORAL
  Filled 2018-11-11 (×3): qty 5

## 2018-11-11 MED ORDER — POLYETHYLENE GLYCOL 3350 17 G PO PACK
17.0000 g | PACK | Freq: Two times a day (BID) | ORAL | Status: DC
  Administered 2018-11-11 – 2018-11-15 (×7): 17 g via ORAL
  Filled 2018-11-11 (×6): qty 1

## 2018-11-11 MED ORDER — SODIUM CHLORIDE 0.9 % IV SOLN
INTRAVENOUS | Status: DC
  Administered 2018-11-11: 17:00:00 via INTRAVENOUS

## 2018-11-11 MED ORDER — SODIUM CHLORIDE 0.9 % IV SOLN
INTRAVENOUS | Status: DC
  Administered 2018-11-11 – 2018-11-12 (×2): via INTRAVENOUS

## 2018-11-11 MED ORDER — INSULIN ASPART 100 UNIT/ML ~~LOC~~ SOLN: 0.0000 [IU] | SUBCUTANEOUS | Status: DC

## 2018-11-11 MED ORDER — SENNA 8.6 MG PO TABS
2.0000 | ORAL_TABLET | Freq: Every day | ORAL | Status: DC
  Administered 2018-11-11 – 2018-11-15 (×4): 17.2 mg via ORAL
  Filled 2018-11-11 (×5): qty 2

## 2018-11-11 MED ORDER — SODIUM CHLORIDE 0.9 % IV SOLN: INTRAVENOUS | Status: DC

## 2018-11-11 MED ORDER — POLYETHYLENE GLYCOL 3350 17 G PO PACK: 17.0000 g | PACK | Freq: Every day | ORAL | Status: DC

## 2018-11-11 MED ORDER — DOCUSATE SODIUM 100 MG PO CAPS
100.0000 mg | ORAL_CAPSULE | Freq: Two times a day (BID) | ORAL | Status: DC
  Administered 2018-11-11: 100 mg via ORAL
  Filled 2018-11-11: qty 1

## 2018-11-11 MED ORDER — SODIUM CHLORIDE 0.9 % IV SOLN
510.0000 mg | INTRAVENOUS | Status: DC
  Administered 2018-11-11: 510 mg via INTRAVENOUS
  Filled 2018-11-11: qty 17

## 2018-11-11 MED ORDER — INSULIN GLARGINE 100 UNIT/ML ~~LOC~~ SOLN
10.0000 [IU] | Freq: Every day | SUBCUTANEOUS | Status: DC
  Administered 2018-11-11: 10 [IU] via SUBCUTANEOUS
  Filled 2018-11-11 (×2): qty 0.1

## 2018-11-11 MED ORDER — INSULIN GLARGINE 100 UNIT/ML ~~LOC~~ SOLN: 10.0000 [IU] | SUBCUTANEOUS | Status: DC

## 2018-11-11 MED ORDER — POTASSIUM CHLORIDE CRYS ER 20 MEQ PO TBCR
40.0000 meq | EXTENDED_RELEASE_TABLET | Freq: Once | ORAL | Status: AC
  Administered 2018-11-11: 40 meq via ORAL
  Filled 2018-11-11: qty 2

## 2018-11-11 MED ORDER — ACETAMINOPHEN 325 MG PO TABS
650.0000 mg | ORAL_TABLET | Freq: Four times a day (QID) | ORAL | Status: DC | PRN
  Administered 2018-11-12 – 2018-11-13 (×2): 650 mg via ORAL
  Filled 2018-11-11 (×2): qty 2

## 2018-11-11 MED ORDER — DARUN-COBIC-EMTRICIT-TENOFAF 800-150-200-10 MG PO TABS
1.0000 | ORAL_TABLET | Freq: Every day | ORAL | Status: DC
  Administered 2018-11-11 – 2018-11-15 (×5): 1 via ORAL
  Filled 2018-11-11 (×5): qty 1

## 2018-11-11 MED ORDER — LIDOCAINE 5 % EX OINT
1.0000 "application " | TOPICAL_OINTMENT | Freq: Three times a day (TID) | CUTANEOUS | Status: DC | PRN
  Filled 2018-11-11: qty 35.44

## 2018-11-11 MED ORDER — ENOXAPARIN SODIUM 40 MG/0.4ML ~~LOC~~ SOLN
40.0000 mg | SUBCUTANEOUS | Status: DC
  Administered 2018-11-11: 40 mg via SUBCUTANEOUS
  Filled 2018-11-11: qty 0.4

## 2018-11-11 MED ORDER — INSULIN REGULAR(HUMAN) IN NACL 100-0.9 UT/100ML-% IV SOLN
INTRAVENOUS | Status: DC
  Administered 2018-11-11: 5.1 [IU]/h via INTRAVENOUS
  Filled 2018-11-11: qty 100

## 2018-11-11 MED ORDER — DOLUTEGRAVIR SODIUM 50 MG PO TABS
50.0000 mg | ORAL_TABLET | Freq: Every day | ORAL | Status: DC
  Administered 2018-11-11 – 2018-11-15 (×5): 50 mg via ORAL
  Filled 2018-11-11 (×5): qty 1

## 2018-11-11 MED ORDER — INSULIN ASPART 100 UNIT/ML ~~LOC~~ SOLN
0.0000 [IU] | Freq: Every day | SUBCUTANEOUS | Status: DC
  Administered 2018-11-11: 2 [IU] via SUBCUTANEOUS

## 2018-11-11 MED ORDER — INSULIN ASPART 100 UNIT/ML ~~LOC~~ SOLN
0.0000 [IU] | Freq: Three times a day (TID) | SUBCUTANEOUS | Status: DC
  Administered 2018-11-11: 7 [IU] via SUBCUTANEOUS
  Administered 2018-11-11: 1 [IU] via SUBCUTANEOUS
  Administered 2018-11-12: 5 [IU] via SUBCUTANEOUS
  Administered 2018-11-12: 7 [IU] via SUBCUTANEOUS
  Administered 2018-11-12: 2 [IU] via SUBCUTANEOUS
  Administered 2018-11-13 (×2): 5 [IU] via SUBCUTANEOUS
  Administered 2018-11-13: 2 [IU] via SUBCUTANEOUS
  Administered 2018-11-14: 5 [IU] via SUBCUTANEOUS
  Administered 2018-11-14 (×2): 2 [IU] via SUBCUTANEOUS
  Administered 2018-11-15: 3 [IU] via SUBCUTANEOUS

## 2018-11-11 NOTE — Consult Note (Signed)
WOC consulted for genital warts.  Topical care for genital warts ordered.  Systemic treatment would need to come fromMD.   Ordered silver hydrofiber for exudate management and antimicrobial effects which may assist with the odor.    Thanks  Timarion Agcaoili [employer], RN,CWOCN, CNS, [place] [phone])

## 2018-11-11 NOTE — Consult Note (Signed)
[facility]    Date of Admission:  11/10/2018          Reason for Consult: HIV and bulky genital warts    Referring Provider: Dr. Marcellus Scott  Assessment: I will continue his current antiretroviral therapy.  I do not have any concerns at they are contributing to his acidosis.  The best thing for his malodorous discharge from his bulky warts is soap and water.  I will forward this note to his surgeons at [facility] to see if they can give some sense of when surgery will be scheduled.  Plan: 1. Continue Symtuza and Tivicay 2. I will see if we can facilitate scheduling surgery at [facility] for his genital warts 3. Please call if we can be of further assistance while he is here  Principal Problem:   DKA (diabetic ketoacidoses) ([name]) Active Problems:   AKI (acute kidney injury) ([name])   HIV (human immunodeficiency virus infection) ([name])   Condyloma   Scheduled Meds: . Darunavir-Cobicisctat-Emtricitabine-Tenofovir Alafenamide  1 tablet Oral Q breakfast  . dolutegravir  50 mg Oral Daily  . enoxaparin (LOVENOX) injection  40 mg Subcutaneous Q24H  . insulin aspart  0-5 Units Subcutaneous QHS  . insulin aspart  0-9 Units Subcutaneous TID WC  . insulin glargine  10 Units Subcutaneous Daily  . polyethylene glycol  17 g Oral BID  . senna  2 tablet Oral Daily   Continuous Infusions: . sodium chloride 10 mL/hr at 11/11/18 0515  . sodium chloride 100 mL/hr at 11/11/18 1130  . sodium chloride    . dextrose 5 % and 0.45% NaCl Stopped (11/11/18 1130)   PRN Meds:.lidocaine  HPI: [name] is a 48 y.o. male who is followed by my partner, Dr. Judyann Munson, for HIV infection and bulky genital warts.  His infection has been under reasonable control with his Symtuza and Tivicay and he is having slow CD4 reconstitution.  He has been referred and is seen at [facility] for his warts.  He was last seen there in May.  It appears that a big surgery  involving urology and plastic surgery is being planned but has been delayed because of the COVID pandemic.  He has been having a lot of pain and discomfort due to the bulky warts.  He says that he has been unable to work.  I do not see that he is a known diabetic but he was admitted yesterday after presenting to the ED complaining of pain nausea and vomiting.  His blood sugar was 1196 and he had moderate acidosis.  This has improved with treatment overnight.  I examined him with the help of the [name] video interpreter.   Review of Systems: Review of Systems  Constitutional: Positive for malaise/fatigue. Negative for chills, diaphoresis and fever.  Respiratory: Negative for cough.   Cardiovascular: Negative for chest pain.  Gastrointestinal: Positive for nausea and vomiting. Negative for abdominal pain and diarrhea.  Genitourinary: Negative for dysuria.  Musculoskeletal: Positive for back pain.  Neurological: Negative for headaches.    Past Medical History:  Diagnosis Date  . Depression    "stress and depression for any man is common" (03/21/2014)  . Genital warts 01/04/2017  . Hepatitis    "I don't know what hepatitis I have"  . HIV disease ([name])   . TB (pulmonary tuberculosis)    previously treated according to refugee documentation    Social History   Tobacco Use  .  Smoking status: Former Smoker    Packs/day: 0.50    Years: 6.00    Pack years: 3.00    Types: Cigarettes    Quit date: 03/16/2001    Years since quitting: 17.6  . Smokeless tobacco: Never Used  . Tobacco comment: "quit smoking ~ 2003"  Substance Use Topics  . Alcohol use: Yes    Comment: drinks bottled beer intermittently  . Drug use: No    Family History  Problem Relation Age of Onset  . Hypertension Other   . Heart disease Sister    No Known Allergies  OBJECTIVE: Blood pressure 121/68, pulse (!) 102, temperature 98.2 F (36.8 C), temperature source Oral, resp. rate 17, weight 76.9 kg, SpO2 95 %.   Physical Exam Constitutional:      Comments: He is thin and in no distress resting quietly in bed.  Cardiovascular:     Rate and Rhythm: Normal rate and regular rhythm.     Heart sounds: No murmur.  Pulmonary:     Effort: Pulmonary effort is normal.     Breath sounds: Normal breath sounds.  Abdominal:     Palpations: Abdomen is soft.     Tenderness: There is no abdominal tenderness.  Genitourinary:    Comments: He has a very large bulky warts covering his perineum from his rectum to his scrotum.  There is malodorous drainage. Psychiatric:        Mood and Affect: Mood normal.     Lab Results Lab Results  Component Value Date   WBC 9.9 11/11/2018   HGB 7.8 (L) 11/11/2018   HCT 25.4 (L) 11/11/2018   MCV 71.1 (L) 11/11/2018   PLT 280 11/11/2018    Lab Results  Component Value Date   CREATININE 1.28 (H) 11/11/2018   BUN 41 (H) 11/11/2018   NA 138 11/11/2018   K 3.4 (L) 11/11/2018   CL 105 11/11/2018   CO2 24 11/11/2018    Lab Results  Component Value Date   ALT 15 11/10/2018   AST 16 11/10/2018   ALKPHOS 178 (H) 11/10/2018   BILITOT 0.2 (L) 11/10/2018   HIV 1 RNA Quant (copies/mL)  Date Value  08/22/2018 134 (H)  05/18/2018 76 (H)  10/11/2017 37 (H)   CD4 T Cell Abs (/uL)  Date Value  08/22/2018 172 (L)  05/18/2018 100 (L)  10/11/2017 110 (L)     Microbiology: Recent Results (from the past 240 hour(s))  SARS Coronavirus 2 [facility] order, Performed in [facility] hospital lab) Nasopharyngeal Nasopharyngeal Swab     Status: None   Collection Time: 11/10/18 10:19 PM   Specimen: Nasopharyngeal Swab  Result Value Ref Range Status   SARS Coronavirus 2 NEGATIVE NEGATIVE Final    Comment: (NOTE) If result is NEGATIVE SARS-CoV-2 target nucleic acids are NOT DETECTED. The SARS-CoV-2 RNA is generally detectable in upper and lower  respiratory specimens during the acute phase of infection. The lowest  concentration of SARS-CoV-2 viral copies this assay can detect  is 250  copies / mL. A negative result does not preclude SARS-CoV-2 infection  and should not be used as the sole basis for treatment or other  patient management decisions.  A negative result may occur with  improper specimen collection / handling, submission of specimen other  than nasopharyngeal swab, presence of viral mutation(s) within the  areas targeted by this assay, and inadequate number of viral copies  (<250 copies / mL). A negative result must be combined with clinical  observations,  patient history, and epidemiological information. If result is POSITIVE SARS-CoV-2 target nucleic acids are DETECTED. The SARS-CoV-2 RNA is generally detectable in upper and lower  respiratory specimens dur ing the acute phase of infection.  Positive  results are indicative of active infection with SARS-CoV-2.  Clinical  correlation with patient history and other diagnostic information is  necessary to determine patient infection status.  Positive results do  not rule out bacterial infection or co-infection with other viruses. If result is PRESUMPTIVE POSTIVE SARS-CoV-2 nucleic acids MAY BE PRESENT.   A presumptive positive result was obtained on the submitted specimen  and confirmed on repeat testing.  While 2019 novel coronavirus  (SARS-CoV-2) nucleic acids may be present in the submitted sample  additional confirmatory testing may be necessary for epidemiological  and / or clinical management purposes  to differentiate between  SARS-CoV-2 and other Sarbecovirus currently known to infect humans.  If clinically indicated additional testing with an alternate test  methodology [phone]([id number]) is advised. The SARS-CoV-2 RNA is generally  detectable in upper and lower respiratory sp ecimens during the acute  phase of infection. The expected result is Negative. Fact Sheet for Patients:  BoilerBrush.com.cyhttps://www.fda.gov/media/136312/download Fact Sheet for Healthcare Providers:  https://pope.com/https://www.fda.gov/media/136313/download This test is not yet approved or cleared by the [place] FDA and has been authorized for detection and/or diagnosis of SARS-CoV-2 by FDA under an Emergency Use Authorization (EUA).  This EUA will remain in effect (meaning this test can be used) for the duration of the COVID-19 declaration under Section 564(b)(1) of the Act, 21 U.S.C. section 360bbb-3(b)(1), unless the authorization is terminated or revoked sooner. Performed at [facility], [address] [place], [place] [address]     Cliffton AstersJohn Knolan Simien, MD [facility] [phone] pager   [phone] cell 11/11/2018, 1:25 PM

## 2018-11-11 NOTE — Progress Notes (Signed)
PROGRESS NOTE   [name]  ZOX:[id number]    DOB: 02/20/1971    DOA: 11/10/2018  PCP: Nestor RampNeal, Sara L, MD   I have briefly reviewed patients previous medical records in [facility].  Chief Complaint  Patient presents with  . Kidney Pain  . Emesis    Brief Narrative:  48 year old [name] speaking male with PMH of HIV under reasonable control, bulky genital warts awaiting extensive surgery at [facility] delayed due to COVID pandemic, TB, hepatitis, depression presented to [facility] ED on 11/10/2018 due to 2-day history of nausea, vomiting, abdominal pain, constipation and weakness.  In ED, he had blood glucose of 1196, sodium 119, potassium 6.3, bicarbonate 19, BUN 56, creatinine 1.93, anion gap 17.  He was admitted for newly diagnosed DM with DKA, dehydration, acute renal failure.  CT abdomen without acute findings.  Briefly on IV insulin drip, transitioned to Lantus and SSI after DKA resolved.  ID consulted for HIV and bulky genital warts but this will be deferred to outpatient follow-up.   Assessment & Plan:   Principal Problem:   DKA (diabetic ketoacidoses) ([name]) Active Problems:   HIV (human immunodeficiency virus infection) ([name])   AKI (acute kidney injury) ([name])   Condyloma   Newly diagnosed DM 2 presented with DKA.  Admission labs: Sodium 119, potassium 6.3, bicarbonate 19, glucose 1196, BUN 56, creatinine 1.93 and anion gap 17.  A1c pending.  Treated with IV fluids and IV insulin per DKA protocol with frequent BMP monitoring.  DKA resolved and patient was transitioned to Lantus 10 units daily along with SSI.  Monitor CBGs closely and adjust insulins as needed.  Diabetes coordinator input appreciated.  It appears that patient may have to be discharged on ReliOn brand 70/30 insulin at discharge due to lack of insurance.  Can consider switching to this in the morning.  Dehydration with hyponatremia  Secondary to osmotic diuresis.  Hyponatremia also due to  hyperglycemia/pseudohyponatremia.  Improved after IV fluids but still not euvolemic.  Continue IV fluids overnight.  Encourage oral intake.  Acute renal failure  Secondary to dehydration.  Improved promptly after IV fluids.  Creatinine down to 1.2.  Follow BMP in a.m.  No hydronephrosis or nephrolithiasis by CT abdomen without contrast.  Hypokalemia  Replace and follow.  Iron deficiency anemia May be multifactorial related to nutritional deficiency and suspect slow blood loss from large genital warts. Anemia panel: Iron 10, TIBC 407, saturation ratio 2, ferritin 7, B12: 794. I discussed in detail with patient regarding options of oral versus IV iron replacement and given severity of his iron deficiency, feel IV iron is better, discussed risks and benefits along with interpreter.  Patient opted for IV iron. Follow CBC in a.m. and transfuse if hemoglobin 7 g or less.  HIV ID input appreciated.  Continue Symtuza and Tivikay  Bulky genital warts ID input appreciated.  ID will coordinate with surgeons at [facility] regarding timing of surgery.  Social issues As per son's report to RN, patient lives home alone and has no one to care for him while he has been sick and unable to care for himself for a long time. Social work and case management consulted.  Constipation Started bowel regimen.  DVT prophylaxis: Lovenox Code Status: Full Family Communication: None at bedside.  Left VM message for son to call back. Disposition: DC home pending clinical improvement   Consultants:  ID  Procedures:  None  Antimicrobials:  HIV meds as noted above   Subjective:  Patient interviewed and examined along with RN at bedside using phone interpreter.  Overall feels much better.  Feels stronger.  Denies pain.  Wanted help for management of his "wounds" referring to his genital warts.  Objective:  Vitals:   11/11/18 0128 11/11/18 0449 11/11/18 0817 11/11/18 1640  BP: 119/80 114/74 121/68  118/81  Pulse: (!) 105 (!) 107 (!) 102 (!) 103  Resp: 20 19 17 17   Temp: 98.3 F (36.8 C) 98.4 F (36.9 C) 98.2 F (36.8 C) 98 F (36.7 C)  TempSrc: Oral Oral Oral Oral  SpO2: 97% 95%  95%  Weight: 76.9 kg       Examination:  General exam: Young male, moderately built and thinly nourished lying comfortably supine in bed.  Oral mucosa with borderline hydration.  Foul smell upon entering patient's room. Respiratory system: Clear to auscultation. Respiratory effort normal. Cardiovascular system: S1 & S2 heard, RRR. No JVD, murmurs, rubs, gallops or clicks. No pedal edema.  Telemetry personally reviewed: SR-sinus tachycardia in the 100s. Gastrointestinal system: Abdomen is nondistended, soft and nontender. No organomegaly or masses felt. Normal bowel sounds heard. GU: Large bulky warts covering his perineum from his rectum to his scrotum with malodorous drainage.  No bleeding. Central nervous system: Alert and oriented. No focal neurological deficits. Extremities: Symmetric 5 x 5 power. Skin: No rashes, lesions or ulcers Psychiatry: Judgement and insight appear normal. Mood & affect appropriate.     Data Reviewed: I have personally reviewed following labs and imaging studies  CBC: Recent Labs  Lab 11/10/18 1820 11/10/18 2344 11/11/18 0151  WBC 8.7 9.0 9.9  NEUTROABS  --  7.2  --   HGB 7.7* 7.5* 7.8*  HCT 28.8* 25.1* 25.4*  MCV 79.6* 72.5* 71.1*  PLT 312 303 053   Basic Metabolic Panel: Recent Labs  Lab 11/10/18 1820 11/10/18 2343 11/10/18 2344 11/11/18 0151 11/11/18 0821  NA 119* 138  --  139 138  K 6.3* 4.1  --  4.0 3.4*  CL 83* 104  --  103 105  CO2 19* 24  --  24 24  GLUCOSE 1,196* 606*  --  362* 156*  BUN 56* 54*  --  50* 41*  CREATININE 1.93* 1.69*  --  1.57* 1.28*  CALCIUM 10.1 10.1  --  10.8* 10.0  MG  --   --  2.7* 2.7*  --   PHOS  --  2.8  --  3.5  --    Liver Function Tests: Recent Labs  Lab 11/10/18 1820 11/10/18 2343  AST 16  --   ALT 15  --    ALKPHOS 178*  --   BILITOT 0.2*  --   PROT 8.6*  --   ALBUMIN 3.2* 3.0*    Cardiac Enzymes: No results for input(s): CKTOTAL, CKMB, CKMBINDEX, TROPONINI in the last 168 hours.  CBG: Recent Labs  Lab 11/11/18 0618 11/11/18 0722 11/11/18 0829 11/11/18 1045 11/11/18 1638  GLUCAP 183* 159* 149* 104* 335*    Recent Results (from the past 240 hour(s))  SARS Coronavirus 2 [facility] order, Performed in [facility] hospital lab) Nasopharyngeal Nasopharyngeal Swab     Status: None   Collection Time: 11/10/18 10:19 PM   Specimen: Nasopharyngeal Swab  Result Value Ref Range Status   SARS Coronavirus 2 NEGATIVE NEGATIVE Final    Comment: (NOTE) If result is NEGATIVE SARS-CoV-2 target nucleic acids are NOT DETECTED. The SARS-CoV-2 RNA is generally detectable in upper and lower  respiratory specimens during the acute  phase of infection. The lowest  concentration of SARS-CoV-2 viral copies this assay can detect is 250  copies / mL. A negative result does not preclude SARS-CoV-2 infection  and should not be used as the sole basis for treatment or other  patient management decisions.  A negative result may occur with  improper specimen collection / handling, submission of specimen other  than nasopharyngeal swab, presence of viral mutation(s) within the  areas targeted by this assay, and inadequate number of viral copies  (<250 copies / mL). A negative result must be combined with clinical  observations, patient history, and epidemiological information. If result is POSITIVE SARS-CoV-2 target nucleic acids are DETECTED. The SARS-CoV-2 RNA is generally detectable in upper and lower  respiratory specimens dur ing the acute phase of infection.  Positive  results are indicative of active infection with SARS-CoV-2.  Clinical  correlation with patient history and other diagnostic information is  necessary to determine patient infection status.  Positive results do  not rule out bacterial  infection or co-infection with other viruses. If result is PRESUMPTIVE POSTIVE SARS-CoV-2 nucleic acids MAY BE PRESENT.   A presumptive positive result was obtained on the submitted specimen  and confirmed on repeat testing.  While 2019 novel coronavirus  (SARS-CoV-2) nucleic acids may be present in the submitted sample  additional confirmatory testing may be necessary for epidemiological  and / or clinical management purposes  to differentiate between  SARS-CoV-2 and other Sarbecovirus currently known to infect humans.  If clinically indicated additional testing with an alternate test  methodology [phone] is advised. The SARS-CoV-2 RNA is generally  detectable in upper and lower respiratory sp ecimens during the acute  phase of infection. The expected result is Negative. Fact Sheet for Patients:  BoilerBrush.com.cyhttps://www.fda.gov/media/136312/download Fact Sheet for Healthcare Providers: https://pope.com/https://www.fda.gov/media/136313/download This test is not yet approved or cleared by the [place] FDA and has been authorized for detection and/or diagnosis of SARS-CoV-2 by FDA under an Emergency Use Authorization (EUA).  This EUA will remain in effect (meaning this test can be used) for the duration of the COVID-19 declaration under Section 564(b)(1) of the Act, 21 U.S.C. section 360bbb-3(b)(1), unless the authorization is terminated or revoked sooner. Performed at [facility], [address] [place], [place] [address]          Radiology Studies: Ct Abdomen Pelvis Wo Contrast  Result Date: 11/10/2018 CLINICAL DATA:  48 year old male with left-sided back pain. Acute renal failure. Concern for kidney stone. EXAM: CT ABDOMEN AND PELVIS WITHOUT CONTRAST TECHNIQUE: Multidetector CT imaging of the abdomen and pelvis was performed following the standard protocol without IV contrast. COMPARISON:  None. FINDINGS: Evaluation of this exam is limited in the absence of intravenous contrast. Evaluation is  also limited due to respiratory motion artifact. Lower chest: Moderate bronchiectatic changes in the left lower lobe. There is hypoattenuation of the cardiac blood pool suggestive of a degree of anemia. Clinical correlation is recommended. No intra-abdominal free air or free fluid Hepatobiliary: No focal liver abnormality is seen. No gallstones, gallbladder wall thickening, or biliary dilatation. Pancreas: Unremarkable. No pancreatic ductal dilatation or surrounding inflammatory changes. Spleen: Normal in size without focal abnormality. Adrenals/Urinary Tract: Adrenal glands are unremarkable. Kidneys are normal, without renal calculi, focal lesion, or hydronephrosis. Bladder is unremarkable. Stomach/Bowel: There is moderate stool throughout the colon. There is no bowel obstruction or active inflammation. Normal appendix. Vascular/Lymphatic: The abdominal aorta and IVC are unremarkable. Minimal atherosclerotic calcification of the iliac arteries. No portal  venous gas. There is no adenopathy. Reproductive: The prostate and seminal vesicles are grossly unremarkable. No pelvic mass. Other: None Musculoskeletal: No acute or significant osseous findings. IMPRESSION: 1. No acute intra-abdominal or pelvic pathology. No hydronephrosis or nephrolithiasis. 2. Moderate colonic stool burden. No bowel obstruction or active inflammation. Normal appendix. Electronically Signed   By: Elgie Collard M.D.   On: 11/10/2018 22:30   Dg Chest Portable 1 View  Result Date: 11/10/2018 CLINICAL DATA:  Cough and shortness of breath EXAM: PORTABLE CHEST 1 VIEW COMPARISON:  11/21/2017 and prior radiographs FINDINGS: The cardiomediastinal silhouette is unremarkable. Chronic pulmonary changes/scarring within the LOWER lungs, LEFT-greater-than-RIGHT, again noted. There is no evidence of focal airspace disease, pulmonary edema, suspicious pulmonary nodule/mass, pleural effusion, or pneumothorax. No acute bony abnormalities are identified.  IMPRESSION: No evidence of acute cardiopulmonary disease. Electronically Signed   By: Harmon Pier M.D.   On: 11/10/2018 21:19        Scheduled Meds: . Darunavir-Cobicisctat-Emtricitabine-Tenofovir Alafenamide  1 tablet Oral Q breakfast  . dolutegravir  50 mg Oral Daily  . enoxaparin (LOVENOX) injection  40 mg Subcutaneous Q24H  . insulin aspart  0-5 Units Subcutaneous QHS  . insulin aspart  0-9 Units Subcutaneous TID WC  . insulin glargine  10 Units Subcutaneous Daily  . polyethylene glycol  17 g Oral BID  . senna  2 tablet Oral Daily   Continuous Infusions: . sodium chloride 10 mL/hr at 11/11/18 0515  . sodium chloride 100 mL/hr at 11/11/18 1130  . sodium chloride    . dextrose 5 % and 0.45% NaCl Stopped (11/11/18 1130)  . ferumoxytol       LOS: 1 day     Marcellus Scott, MD, FACP, [facility]. [facility]  To contact the attending provider between 7A-7P or the covering provider during after hours 7P-7A, please log into the web site [email or web address] and access using universal [place] password for that web site. If you do not have the password, please call the hospital operator.  11/11/2018, 5:04 PM

## 2018-11-11 NOTE — ED Notes (Signed)
ED TO INPATIENT HANDOFF REPORT  ED Nurse Name and Phone #: Erin Fulling #[phone]  S Name/Age/Gender [name] 48 y.o. male Room/Bed: 016C/016C  Code Status   Code Status: Prior  Home/SNF/Other Home Patient oriented to: self, place, time and situation Is this baseline? Yes   Triage Complete: Triage complete  Chief Complaint back pain  Triage Note Patient reports kidney pain x 1 week - states pain is all over his back, feels like his kidneys are acting. Hx HIV - compliant with medications, no relief with at home pain medication. Also endorses N/V any time he eats and he feels generally weak. Denies fevers/chills. Also endorses blood in urine. Pain worse with movement. Ambulatory with steady gait.    Allergies No Known Allergies  Level of Care/Admitting Diagnosis ED Disposition    ED Disposition Condition Comment   Admit  Hospital Area: [facility] [[id number]]  Level of Care: Progressive [102]  Covid Evaluation: Asymptomatic Screening Protocol (No Symptoms)  Diagnosis: DKA (diabetic ketoacidoses) ([name]) [488891]  Admitting Physician: Woody Seller  Attending Physician: Berton Mount I [[id number]]  Estimated length of stay: 3 - 4 days  Certification:: I certify this patient will need inpatient services for at least 2 midnights  PT Class (Do Not Modify): Inpatient [101]  PT Acc Code (Do Not Modify): Private [1]       B Medical/Surgery History Past Medical History:  Diagnosis Date  . Depression    "stress and depression for any man is common" (03/21/2014)  . Genital warts 01/04/2017  . Hepatitis    "I don't know what hepatitis I have"  . HIV disease ([name])   . TB (pulmonary tuberculosis)    previously treated according to refugee documentation   Past Surgical History:  Procedure Laterality Date  . FRACTURE SURGERY    . IM NAILING TIBIA Right 03/21/2014  . ORIF ANKLE FRACTURE Right 03/21/2014   lateral malleolus/notes 03/21/2014  .  ORIF ANKLE FRACTURE Right 03/21/2014   Procedure: OPEN REDUCTION INTERNAL FIXATION (ORIF) pilon ;  Surgeon: Eldred Manges, MD;  Location: [facility] OR;  Service: Orthopedics;  Laterality: Right;  . TIBIA IM NAIL INSERTION Right 03/21/2014   Procedure: INTRAMEDULLARY (IM) NAIL TIBIAL;  Surgeon: Eldred Manges, MD;  Location: [facility] OR;  Service: Orthopedics;  Laterality: Right;  Marland Kitchen VIDEO BRONCHOSCOPY Bilateral 03/02/2016   Procedure: VIDEO BRONCHOSCOPY WITHOUT FLUORO;  Surgeon: Roslynn Amble, MD;  Location: [facility] ENDOSCOPY;  Service: Cardiopulmonary;  Laterality: Bilateral;     A IV Location/Drains/Wounds Patient Lines/Drains/Airways Status   Active Line/Drains/Airways    Name:   Placement date:   Placement time:   Site:   Days:   Peripheral IV 11/10/18 Right Antecubital   11/10/18    2121    Antecubital   1          Intake/Output Last 24 hours  Intake/Output Summary (Last 24 hours) at 11/11/2018 0031 Last data filed at 11/10/2018 2345 Gross per 24 hour  Intake 18.3 ml  Output -  Net 18.3 ml    Labs/Imaging Results for orders placed or performed during the hospital encounter of 11/10/18 (from the past 48 hour(s))  Lipase, blood     Status: Abnormal   Collection Time: 11/10/18  6:20 PM  Result Value Ref Range   Lipase 91 (H) 11 - 51 U/L    Comment: Performed at [facility], [address] [place], [place] [address]  Comprehensive metabolic panel  Status: Abnormal   Collection Time: 11/10/18  6:20 PM  Result Value Ref Range   Sodium 119 (LL) 135 - 145 mmol/L    Comment: CRITICAL RESULT CALLED TO, READ BACK BY AND VERIFIED WITH: Pricilla RiffleG NIKOLICH RN AT 1933 ON [id number] BY K FORSYTH    Potassium 6.3 (HH) 3.5 - 5.1 mmol/L    Comment: NO VISIBLE HEMOLYSIS CRITICAL RESULT CALLED TO, READ BACK BY AND VERIFIED WITHPricilla Riffle: G NIKOLICH RN AT 1933 ON [id number] BY K FORSYTH    Chloride 83 (L) 98 - 111 mmol/L   CO2 19 (L) 22 - 32 mmol/L   Glucose, Bld 1,196 (HH) 70 - 99 mg/dL    Comment: RESULTS  CONFIRMED BY MANUAL DILUTION CRITICAL RESULT CALLED TO, READ BACK BY AND VERIFIED WITHReece Agar: [facility] AT 1933 ON [id number] BY K FORSYTH    BUN 56 (H) 6 - 20 mg/dL   Creatinine, Ser 6.571.93 (H) 0.61 - 1.24 mg/dL   Calcium 84.610.1 8.9 - 96.210.3 mg/dL   Total Protein 8.6 (H) 6.5 - 8.1 g/dL   Albumin 3.2 (L) 3.5 - 5.0 g/dL   AST 16 15 - 41 U/L   ALT 15 0 - 44 U/L   Alkaline Phosphatase 178 (H) 38 - 126 U/L   Total Bilirubin 0.2 (L) 0.3 - 1.2 mg/dL   GFR calc non Af Amer 40 (L) >60 mL/min   GFR calc Af Amer 47 (L) >60 mL/min   Anion gap 17 (H) 5 - 15    Comment: Performed at [facility], [address] [place], [place] [address]  CBC     Status: Abnormal   Collection Time: 11/10/18  6:20 PM  Result Value Ref Range   WBC 8.7 4.0 - 10.5 K/uL   RBC 3.62 (L) 4.22 - 5.81 MIL/uL   Hemoglobin 7.7 (L) 13.0 - 17.0 g/dL   HCT 13.228.8 (L) 44.039.0 - 10.252.0 %   MCV 79.6 (L) 80.0 - 100.0 fL   MCH 21.3 (L) 26.0 - 34.0 pg   MCHC 26.7 (L) 30.0 - 36.0 g/dL   RDW 72.516.9 (H) 36.611.5 - 44.015.5 %   Platelets 312 150 - 400 K/uL   nRBC 0.2 0.0 - 0.2 %    Comment: Performed at [facility], [address] [place], [place] [address]  CBG monitoring, ED     Status: Abnormal   Collection Time: 11/10/18  9:21 PM  Result Value Ref Range   Glucose-Capillary >600 (HH) 70 - 99 mg/dL  Urinalysis, Routine w reflex microscopic     Status: Abnormal   Collection Time: 11/10/18  9:39 PM  Result Value Ref Range   Color, Urine STRAW (A) YELLOW   APPearance CLEAR CLEAR   Specific Gravity, Urine 1.024 1.005 - 1.030   pH 6.0 5.0 - 8.0   Glucose, UA >=500 (A) NEGATIVE mg/dL   Hgb urine dipstick SMALL (A) NEGATIVE   Bilirubin Urine NEGATIVE NEGATIVE   Ketones, ur NEGATIVE NEGATIVE mg/dL   Protein, ur NEGATIVE NEGATIVE mg/dL   Nitrite NEGATIVE NEGATIVE   Leukocytes,Ua NEGATIVE NEGATIVE   RBC / HPF 0-5 0 - 5 RBC/hpf   Bacteria, UA NONE SEEN NONE SEEN    Comment: Performed at [facility], [address] [place],  [place] [address]  SARS Coronavirus 2 [facility](Hospital order, Performed in [facility] lab) Nasopharyngeal Nasopharyngeal Swab     Status: None   Collection Time: 11/10/18 10:19 PM   Specimen: Nasopharyngeal Swab  Result Value Ref Range   SARS Coronavirus 2 NEGATIVE NEGATIVE    Comment: (NOTE) If result is NEGATIVE SARS-CoV-2 target nucleic acids are NOT DETECTED. The SARS-CoV-2 RNA is generally detectable in upper and lower  respiratory specimens during the acute phase of infection. The lowest  concentration of SARS-CoV-2 viral copies this assay can detect is 250  copies / mL. A negative result does not preclude SARS-CoV-2 infection  and should not be used as the sole basis for treatment or other  patient management decisions.  A negative result may occur with  improper specimen collection / handling, submission of specimen other  than nasopharyngeal swab, presence of viral mutation(s) within the  areas targeted by this assay, and inadequate number of viral copies  (<250 copies / mL). A negative result must be combined with clinical  observations, patient history, and epidemiological information. If result is POSITIVE SARS-CoV-2 target nucleic acids are DETECTED. The SARS-CoV-2 RNA is generally detectable in upper and lower  respiratory specimens dur ing the acute phase of infection.  Positive  results are indicative of active infection with SARS-CoV-2.  Clinical  correlation with patient history and other diagnostic information is  necessary to determine patient infection status.  Positive results do  not rule out bacterial infection or co-infection with other viruses. If result is PRESUMPTIVE POSTIVE SARS-CoV-2 nucleic acids MAY BE PRESENT.   A presumptive positive result was obtained on the submitted specimen  and confirmed on repeat testing.  While 2019 novel coronavirus  (SARS-CoV-2) nucleic acids may be present in the submitted sample  additional confirmatory testing may be  necessary for epidemiological  and / or clinical management purposes  to differentiate between  SARS-CoV-2 and other Sarbecovirus currently known to infect humans.  If clinically indicated additional testing with an alternate test  methodology [phone]) is advised. The SARS-CoV-2 RNA is generally  detectable in upper and lower respiratory sp ecimens during the acute  phase of infection. The expected result is Negative. Fact Sheet for Patients:  BoilerBrush.com.cy Fact Sheet for Healthcare Providers: https://pope.com/ This test is not yet approved or cleared by the [place] FDA and has been authorized for detection and/or diagnosis of SARS-CoV-2 by FDA under an Emergency Use Authorization (EUA).  This EUA will remain in effect (meaning this test can be used) for the duration of the COVID-19 declaration under Section 564(b)(1) of the Act, 21 U.S.C. section 360bbb-3(b)(1), unless the authorization is terminated or revoked sooner. Performed at [facility], [address], [place], [place] [address]   CBG monitoring, ED     Status: Abnormal   Collection Time: 11/10/18 10:34 PM  Result Value Ref Range   Glucose-Capillary >600 (HH) 70 - 99 mg/dL  CBG monitoring, ED     Status: Abnormal   Collection Time: 11/10/18 11:39 PM  Result Value Ref Range   Glucose-Capillary >600 (HH) 70 - 99 mg/dL   Ct Abdomen Pelvis Wo Contrast  Result Date: 11/10/2018 CLINICAL DATA:  48 year old male with left-sided back pain. Acute renal failure. Concern for kidney stone. EXAM: CT ABDOMEN AND PELVIS WITHOUT CONTRAST TECHNIQUE: Multidetector CT imaging of the abdomen and pelvis was performed following the standard protocol without IV contrast. COMPARISON:  None. FINDINGS: Evaluation of this exam is limited in the absence of intravenous contrast. Evaluation is also limited due to respiratory motion artifact. Lower chest: Moderate bronchiectatic changes in  the left lower lobe. There is hypoattenuation of the cardiac blood pool suggestive of a degree of anemia.  Clinical correlation is recommended. No intra-abdominal free air or free fluid Hepatobiliary: No focal liver abnormality is seen. No gallstones, gallbladder wall thickening, or biliary dilatation. Pancreas: Unremarkable. No pancreatic ductal dilatation or surrounding inflammatory changes. Spleen: Normal in size without focal abnormality. Adrenals/Urinary Tract: Adrenal glands are unremarkable. Kidneys are normal, without renal calculi, focal lesion, or hydronephrosis. Bladder is unremarkable. Stomach/Bowel: There is moderate stool throughout the colon. There is no bowel obstruction or active inflammation. Normal appendix. Vascular/Lymphatic: The abdominal aorta and IVC are unremarkable. Minimal atherosclerotic calcification of the iliac arteries. No portal venous gas. There is no adenopathy. Reproductive: The prostate and seminal vesicles are grossly unremarkable. No pelvic mass. Other: None Musculoskeletal: No acute or significant osseous findings. IMPRESSION: 1. No acute intra-abdominal or pelvic pathology. No hydronephrosis or nephrolithiasis. 2. Moderate colonic stool burden. No bowel obstruction or active inflammation. Normal appendix. Electronically Signed   By: Anner Crete M.D.   On: 11/10/2018 22:30   Dg Chest Portable 1 View  Result Date: 11/10/2018 CLINICAL DATA:  Cough and shortness of breath EXAM: PORTABLE CHEST 1 VIEW COMPARISON:  11/21/2017 and prior radiographs FINDINGS: The cardiomediastinal silhouette is unremarkable. Chronic pulmonary changes/scarring within the LOWER lungs, LEFT-greater-than-RIGHT, again noted. There is no evidence of focal airspace disease, pulmonary edema, suspicious pulmonary nodule/mass, pleural effusion, or pneumothorax. No acute bony abnormalities are identified. IMPRESSION: No evidence of acute cardiopulmonary disease. Electronically Signed   By: Margarette Canada  M.D.   On: 11/10/2018 21:19    Pending Labs Unresulted Labs (From admission, onward)    Start     Ordered   11/10/18 2344  Magnesium  ONCE - STAT,   STAT     11/10/18 2343   11/10/18 2344  CBC with Differential/Platelet  ONCE - STAT,   STAT     11/10/18 2343   11/10/18 2343  Renal function panel  ONCE - STAT,   STAT     11/10/18 2343   Signed and Held  Basic metabolic panel  Now then every 4 hours,   R     Signed and Held   Visual merchandiser and Held  Basic metabolic panel  STAT Now then every 4 hours ,   STAT     Signed and Held   Signed and Held  Hemoglobin A1c  Once,   R    Comments: To assess prior glycemic control.    Signed and Held   Signed and Held  CBC  (enoxaparin (LOVENOX)    CrCl >/= 30 ml/min)  Once,   R    Comments: Baseline for enoxaparin therapy IF NOT ALREADY DRAWN.  Notify MD if PLT < 100 K.    Signed and Held   Signed and Held  Creatinine, serum  (enoxaparin (LOVENOX)    CrCl >/= 30 ml/min)  Once,   R    Comments: Baseline for enoxaparin therapy IF NOT ALREADY DRAWN.    Signed and Held   Signed and Held  Creatinine, serum  (enoxaparin (LOVENOX)    CrCl >/= 30 ml/min)  Weekly,   R    Comments: while on enoxaparin therapy    Signed and Held   Signed and Held  Magnesium  Now then every 4 hours,   R     Signed and Held   Visual merchandiser and Held  Phosphorus  Now then every 4 hours,   R     Signed and Held   Signed and Held  Urinalysis, Complete w Microscopic  Once,  R     Signed and Held   Signed and Held  Occult blood card to lab, stool  Daily,   R     Signed and Held   Signed and Held  Iron and TIBC  Once,   R     Signed and Held   Signed and Held  Ferritin  Once,   R     Signed and Held   Signed and Held  Vitamin B12  Once,   R     Signed and Held   Signed and Held  Folate RBC  Once,   R     Signed and Held   Signed and Held  TSH  Once,   R     Signed and Held          Vitals/Pain Today's Vitals   11/10/18 2126 11/10/18 2130 11/10/18 2230 11/11/18 0000  BP:  126/77 131/90 123/84 (!) 109/91  Pulse: (!) 105 (!) 107 (!) 110 (!) 105  Resp: (!) 24 (!) 25 19 18   Temp:      TempSrc:      SpO2: 92% 90% 92% 94%  PainSc:        Isolation Precautions No active isolations  Medications Medications  insulin regular, human (MYXREDLIN) 100 units/ 100 mL infusion ( Intravenous Rate/Dose Verify 11/10/18 2345)  0.9 %  sodium chloride infusion ( Intravenous New Bag/Given 11/10/18 2125)  sodium chloride flush (NS) 0.9 % injection 3 mL (3 mLs Intravenous Given 11/10/18 2122)  sodium chloride 0.9 % bolus 1,000 mL (1,000 mLs Intravenous New Bag/Given 11/10/18 2122)  sodium chloride 0.9 % bolus 1,000 mL (0 mLs Intravenous Stopped 11/10/18 2343)    Mobility walks   Focused Assessments Skin Pt has severe genital warts on his testicle are and in the inner parts of his thighs   R Recommendations: See Admitting Provider Note  Report given to:   Additional Notes:

## 2018-11-11 NOTE — Progress Notes (Signed)
Pts son called to speak to nurse, stated pt is from home alone and has no one to care for him, he states he has been sick and unable to care for himself for a long time, states he ( the son) works and is unable to be there with him during the day, will consult SS and CM.

## 2018-11-11 NOTE — Progress Notes (Signed)
Inpatient Diabetes Program Recommendations  AACE/ADA: New Consensus Statement on Inpatient Glycemic Control (2015)  Target Ranges:  Prepandial:   less than 140 mg/dL      Peak postprandial:   less than 180 mg/dL (1-2 hours)      Critically ill patients:  140 - 180 mg/dL     Review of Glycemic Control  Diabetes history: New DM diagnosis  Inpatient Diabetes Program Recommendations:    Spoke with patient about new diabetes diagnosis.  Discussed glucose levels on admission. Discussed basic pathophysiology of DM Type 2, basic home care, importance of checking CBGs and maintaining good CBG control to prevent long-term and short-term complications. Reviewed glucose and A1C goals. Reviewed signs and symptoms of hyperglycemia and hypoglycemia along with treatment for both. Discussed impact of nutrition, exercise, stress, sickness, and medications on diabetes control.   Patient does not read swahili but has his son who can read swahili that can re the educational booklet I printed in swahili.  Informed patient that he will be prescribed insulin at time of discharge.  Spoke with patient about following up at our clinic. Patient would prefer insulin pen and needles but was shown both insulin pen and vial ans syringe.  Spoke with patient about [employer] insulin.    Asked patient to check his glucose at least 2 times per day (fasting and alternating second check) and to keep a log book of glucose readings and insulin taken.  Patient verbalized understanding of information discussed and he states that he has no further questions at this time related to diabetes.    Patient eager to give himself injections and check CBGs.   RNs to provide ongoing basic DM education at bedside with this patient and engage patient to actively check blood glucose and administer insulin injections.   Patient given [employer] meter and supplies at bedside. Patient will need insulin prescriptions along with the needle  device.  Thanks, Tama Headings RN, MSN, BC-ADM Inpatient Diabetes Coordinator Team Pager [phone] (8a-5p)

## 2018-11-12 DIAGNOSIS — Z21 Asymptomatic human immunodeficiency virus [HIV] infection status: Secondary | ICD-10-CM

## 2018-11-12 DIAGNOSIS — D509 Iron deficiency anemia, unspecified: Secondary | ICD-10-CM

## 2018-11-12 DIAGNOSIS — D500 Iron deficiency anemia secondary to blood loss (chronic): Secondary | ICD-10-CM

## 2018-11-12 DIAGNOSIS — K625 Hemorrhage of anus and rectum: Secondary | ICD-10-CM

## 2018-11-12 DIAGNOSIS — K921 Melena: Secondary | ICD-10-CM

## 2018-11-12 LAB — CBC
HCT: 22.9 % — ABNORMAL LOW (ref 39.0–52.0)
HCT: 25.5 % — ABNORMAL LOW (ref 39.0–52.0)
Hemoglobin: 6.7 g/dL — CL (ref 13.0–17.0)
Hemoglobin: 7.8 g/dL — ABNORMAL LOW (ref 13.0–17.0)
MCH: 21.5 pg — ABNORMAL LOW (ref 26.0–34.0)
MCH: 22.8 pg — ABNORMAL LOW (ref 26.0–34.0)
MCHC: 29.3 g/dL — ABNORMAL LOW (ref 30.0–36.0)
MCHC: 30.6 g/dL (ref 30.0–36.0)
MCV: 73.6 fL — ABNORMAL LOW (ref 80.0–100.0)
MCV: 74.6 fL — ABNORMAL LOW (ref 80.0–100.0)
Platelets: 252 10*3/uL (ref 150–400)
Platelets: 252 10*3/uL (ref 150–400)
RBC: 3.11 MIL/uL — ABNORMAL LOW (ref 4.22–5.81)
RBC: 3.42 MIL/uL — ABNORMAL LOW (ref 4.22–5.81)
RDW: 15.7 % — ABNORMAL HIGH (ref 11.5–15.5)
RDW: 16 % — ABNORMAL HIGH (ref 11.5–15.5)
WBC: 6.5 10*3/uL (ref 4.0–10.5)
WBC: 7.3 10*3/uL (ref 4.0–10.5)
nRBC: 0 % (ref 0.0–0.2)
nRBC: 0 % (ref 0.0–0.2)

## 2018-11-12 LAB — GLUCOSE, CAPILLARY
Glucose-Capillary: 294 mg/dL — ABNORMAL HIGH (ref 70–99)
Glucose-Capillary: 308 mg/dL — ABNORMAL HIGH (ref 70–99)
Glucose-Capillary: 158 mg/dL — ABNORMAL HIGH (ref 70–99)
Glucose-Capillary: 268 mg/dL — ABNORMAL HIGH (ref 70–99)

## 2018-11-12 LAB — COMPREHENSIVE METABOLIC PANEL
ALT: 11 U/L (ref 0–44)
AST: 19 U/L (ref 15–41)
Albumin: 2.4 g/dL — ABNORMAL LOW (ref 3.5–5.0)
Alkaline Phosphatase: 75 U/L (ref 38–126)
Anion gap: 7 (ref 5–15)
BUN: 23 mg/dL — ABNORMAL HIGH (ref 6–20)
CO2: 24 mmol/L (ref 22–32)
Calcium: 9.3 mg/dL (ref 8.9–10.3)
Chloride: 104 mmol/L (ref 98–111)
Creatinine, Ser: 1.11 mg/dL (ref 0.61–1.24)
GFR calc Af Amer: >60 mL/min (ref >60)
GFR calc non Af Amer: >60 mL/min (ref >60)
Glucose, Bld: 283 mg/dL — ABNORMAL HIGH (ref 70–99)
Potassium: 4.1 mmol/L (ref 3.5–5.1)
Sodium: 135 mmol/L (ref 135–145)
Total Bilirubin: 0.5 mg/dL (ref 0.3–1.2)
Total Protein: 7.1 g/dL (ref 6.5–8.1)

## 2018-11-12 LAB — HEMOGLOBIN AND HEMATOCRIT, BLOOD
HCT: 26 % — ABNORMAL LOW (ref 39.0–52.0)
Hemoglobin: 7.7 g/dL — ABNORMAL LOW (ref 13.0–17.0)

## 2018-11-12 LAB — PREPARE RBC (CROSSMATCH)

## 2018-11-12 LAB — ABO/RH: ABO/RH(D): O POS

## 2018-11-12 LAB — MAGNESIUM: Magnesium: 1.8 mg/dL (ref 1.7–2.4)

## 2018-11-12 LAB — HEMOGLOBIN A1C
Hgb A1c MFr Bld: 9.5 % — ABNORMAL HIGH (ref 4.8–5.6)
Mean Plasma Glucose: 226 mg/dL

## 2018-11-12 MED ORDER — INSULIN ASPART PROT & ASPART (70-30 MIX) 100 UNIT/ML ~~LOC~~ SUSP
15.0000 [IU] | Freq: Every day | SUBCUTANEOUS | Status: DC
  Administered 2018-11-12: 15 [IU] via SUBCUTANEOUS
  Filled 2018-11-12: qty 10

## 2018-11-12 MED ORDER — POLYETHYLENE GLYCOL 3350 17 G PO PACK
17.0000 g | PACK | Freq: Two times a day (BID) | ORAL | Status: DC
  Filled 2018-11-12: qty 1

## 2018-11-12 MED ORDER — INSULIN ASPART PROT & ASPART (70-30 MIX) 100 UNIT/ML ~~LOC~~ SUSP
10.0000 [IU] | Freq: Every day | SUBCUTANEOUS | Status: DC
  Filled 2018-11-12: qty 10

## 2018-11-12 MED ORDER — PEG-KCL-NACL-NASULF-NA ASC-C 100 G PO SOLR: 0.5000 | Freq: Once | ORAL | Status: DC

## 2018-11-12 MED ORDER — PEG-KCL-NACL-NASULF-NA ASC-C 100 G PO SOLR
0.5000 | Freq: Once | ORAL | Status: AC
  Administered 2018-11-13: 100 g via ORAL
  Filled 2018-11-12: qty 1

## 2018-11-12 MED ORDER — PEG-KCL-NACL-NASULF-NA ASC-C 100 G PO SOLR
0.5000 | Freq: Once | ORAL | Status: DC
  Filled 2018-11-12: qty 1

## 2018-11-12 MED ORDER — PEG-KCL-NACL-NASULF-NA ASC-C 100 G PO SOLR
0.5000 | Freq: Once | ORAL | Status: AC
  Administered 2018-11-13: 100 g via ORAL

## 2018-11-12 MED ORDER — PEG-KCL-NACL-NASULF-NA ASC-C 100 G PO SOLR: 1.0000 | Freq: Once | ORAL | Status: DC

## 2018-11-12 MED ORDER — SODIUM CHLORIDE 0.9% IV SOLUTION: Freq: Once | INTRAVENOUS | Status: DC

## 2018-11-12 MED ORDER — INSULIN ASPART PROT & ASPART (70-30 MIX) 100 UNIT/ML ~~LOC~~ SUSP
15.0000 [IU] | Freq: Every day | SUBCUTANEOUS | Status: DC
  Administered 2018-11-12: 15 [IU] via SUBCUTANEOUS

## 2018-11-12 NOTE — Progress Notes (Addendum)
PROGRESS NOTE   [name]  QZE:[id number]    DOB: 21-Feb-1971    DOA: 11/10/2018  PCP: Dickie La, MD   I have briefly reviewed patients previous medical records in [facility].  Chief Complaint  Patient presents with   Kidney Pain   Emesis    Brief Narrative:  48 year old 42 speaking male with PMH of HIV under reasonable control, bulky genital warts awaiting extensive surgery at [facility] delayed due to [place] pandemic, TB, hepatitis, depression presented to [facility] ED on 11/10/2018 due to 2-day history of nausea, vomiting, abdominal pain, constipation and weakness.  In ED, he had blood glucose of 1196, sodium 119, potassium 6.3, bicarbonate 19, BUN 56, creatinine 1.93, anion gap 17.  He was admitted for newly diagnosed DM with DKA, dehydration, acute renal failure.  CT abdomen without acute findings.  Briefly on IV insulin drip, transitioned to Lantus and SSI after DKA resolved.  ID consulted for HIV and bulky genital warts but this will be deferred to outpatient follow-up.  Rectal bleeding noted 8/29, Hb down to 6.7, transfusing 1 unit PRBC, [facility] consulted and plan colonoscopy 8/31.   Assessment & Plan:   Principal Problem:   DKA (diabetic ketoacidoses) ([place]) Active Problems:   HIV (human immunodeficiency virus infection) ([place])   AKI (acute kidney injury) ([place])   Condyloma   Newly diagnosed DM 2 presented with DKA.  Admission labs: Sodium 119, potassium 6.3, bicarbonate 19, glucose 1196, BUN 56, creatinine 1.93 and anion gap 17.  A1c pending.  Treated with IV fluids and IV insulin per DKA protocol with frequent BMP monitoring.  DKA resolved.  Patient was briefly switched to Lantus but due to lack of insurance and affordability, changed this to 70/30 insulin 15 units twice daily along with NovoLog SSI without bedtime coverage.  Monitor closely and adjust insulins as needed.  Rectal bleeding  On 8/29 patient developed small volume bright red  bleeding per rectum.  He reports ongoing intermittent such episodes at home for the last month upon straining.  Does report some rectal pain.  DD: Related to bulky anal area warts, hemorrhoids, malignancy and other etiologies.  [facility] consulted and plan colonoscopy 8/31.  Dehydration with hyponatremia  Secondary to osmotic diuresis.  Hyponatremia also due to hyperglycemia/pseudohyponatremia.  Resolved.  Acute renal failure  Secondary to dehydration.  No hydronephrosis or nephrolithiasis by CT abdomen without contrast.  Resolved after IV fluids.  Hypokalemia  Replaced.  Iron deficiency anemia  May be multifactorial related to nutritional deficiency, suspect slow blood loss from large genital warts and recurrent episodes of small-volume rectal bleeding.  Anemia panel: Iron 10, TIBC 407, saturation ratio 2, ferritin 7, B12: 794.  Getting IV Feraheme  Hemoglobin dropped to 6.7 on 8/29, s/p 1 unit PRBC and improved to 7.7.  Follow CBC closely and transfuse to keep hemoglobin >7 g per DL.  HIV  ID input appreciated.  Continue Symtuza and Tivikay  Bulky genital warts  ID input appreciated.  ID will coordinate with surgeons at [facility] regarding timing of surgery.  As per [facility], patient had been evaluated at [facility] by Dr. Cheryll Cockayne, General Surgery but he declined surgery at that time.  Social issues  As per son's report to RN, patient lives home alone and has no one to care for him while he has been sick and unable to care for himself for a long time.  Social work and case management consulted.  Constipation  Started bowel  regimen.  DVT prophylaxis: Discontinued Lovenox due to rectal bleeding.  SCDs. Code Status: Full Family Communication: I discussed in detail with patient's son via phone, updated care and answered all questions. Disposition: DC home pending clinical improvement   Consultants:  ID [facility]  Procedures:  None  Antimicrobials:    HIV meds as noted above   Subjective: Patient had small-volume bright red rectal bleeding with some rectal pain, normal colored stool.  No abdominal pain, nausea vomiting.  Reportedly has been having such rectal bleeding intermittently for the last month.  History obtained through phone interpreter.  Objective:  Vitals:   11/12/18 1129 11/12/18 1133 11/12/18 1505 11/12/18 1521  BP: 132/90 119/76  136/88  Pulse: 93     Resp: 17  (!) 22 (!) 21  Temp: 98.6 F (37 C)   98.2 F (36.8 C)  TempSrc: Oral   Oral  SpO2: 92%   96%  Weight:        Examination:  General exam: Young male, moderately built and thinly nourished lying comfortably supine in bed.  Oral mucosa moist. Respiratory system: Clear to auscultation.  No increased work of breathing. Cardiovascular system: S1 and S2 heard, RRR.  No JVD, murmurs or pedal edema.  Telemetry personally reviewed: Sinus rhythm-mild ST in the 100s/110s. Gastrointestinal system: Abdomen is nondistended, soft and nontender. No organomegaly or masses felt. Normal bowel sounds heard. GU: Large bulky warts covering his perineum from his rectum to his scrotum with malodorous drainage.  No bleeding. Central nervous system: Alert and oriented. No focal neurological deficits. Extremities: Symmetric 5 x 5 power. Skin: No rashes, lesions or ulcers Psychiatry: Judgement and insight appear normal. Mood & affect appropriate.     Data Reviewed: I have personally reviewed following labs and imaging studies  CBC: Recent Labs  Lab 11/10/18 1820 11/10/18 2344 11/12/18 0322 11/12/18 1630  WBC 8.7 9.0 6.5  --   NEUTROABS  --  7.2  --   --   HGB 7.7* 7.5* 6.7* 7.7*  HCT 28.8* 25.1* 22.9* 26.0*  MCV 79.6* 72.5* 73.6*  --   PLT 312 303 252  --    Basic Metabolic Panel: Recent Labs  Lab 11/10/18 1820 11/10/18 2343 11/10/18 2344 11/11/18 0151 11/11/18 0821 11/12/18 0322  NA 119* 138  --  139 138 135  K 6.3* 4.1  --  4.0 3.4* 4.1  CL 83* 104  --   103 105 104  CO2 19* 24  --  '24 24 24  ' GLUCOSE 1,196* 606*  --  362* 156* 283*  BUN 56* 54*  --  50* 41* 23*  CREATININE 1.93* 1.69*  --  1.57* 1.28* 1.11  CALCIUM 10.1 10.1  --  10.8* 10.0 9.3  MG  --   --  2.7* 2.7*  --  1.8  PHOS  --  2.8  --  3.5  --   --    Liver Function Tests: Recent Labs  Lab 11/10/18 1820 11/10/18 2343 11/12/18 0322  AST 16  --  19  ALT 15  --  11  ALKPHOS 178*  --  75  BILITOT 0.2*  --  0.5  PROT 8.6*  --  7.1  ALBUMIN 3.2* 3.0* 2.4*    Cardiac Enzymes: No results for input(s): CKTOTAL, CKMB, CKMBINDEX, TROPONINI in the last 168 hours.  CBG: Recent Labs  Lab 11/11/18 1128 11/11/18 1638 11/11/18 2118 11/12/18 0754 11/12/18 1134  GLUCAP 136* 335* 232* 294* 308*    Recent  Results (from the past 240 hour(s))  SARS Coronavirus 2 [facility] order, Performed in [facility] hospital lab) Nasopharyngeal Nasopharyngeal Swab     Status: None   Collection Time: 11/10/18 10:19 PM   Specimen: Nasopharyngeal Swab  Result Value Ref Range Status   SARS Coronavirus 2 NEGATIVE NEGATIVE Final    Comment: (NOTE) If result is NEGATIVE SARS-CoV-2 target nucleic acids are NOT DETECTED. The SARS-CoV-2 RNA is generally detectable in upper and lower  respiratory specimens during the acute phase of infection. The lowest  concentration of SARS-CoV-2 viral copies this assay can detect is 250  copies / mL. A negative result does not preclude SARS-CoV-2 infection  and should not be used as the sole basis for treatment or other  patient management decisions.  A negative result may occur with  improper specimen collection / handling, submission of specimen other  than nasopharyngeal swab, presence of viral mutation(s) within the  areas targeted by this assay, and inadequate number of viral copies  (<250 copies / mL). A negative result must be combined with clinical  observations, patient history, and epidemiological information. If result is POSITIVE SARS-CoV-2  target nucleic acids are DETECTED. The SARS-CoV-2 RNA is generally detectable in upper and lower  respiratory specimens dur ing the acute phase of infection.  Positive  results are indicative of active infection with SARS-CoV-2.  Clinical  correlation with patient history and other diagnostic information is  necessary to determine patient infection status.  Positive results do  not rule out bacterial infection or co-infection with other viruses. If result is PRESUMPTIVE POSTIVE SARS-CoV-2 nucleic acids MAY BE PRESENT.   A presumptive positive result was obtained on the submitted specimen  and confirmed on repeat testing.  While 2019 novel coronavirus  (SARS-CoV-2) nucleic acids may be present in the submitted sample  additional confirmatory testing may be necessary for epidemiological  and / or clinical management purposes  to differentiate between  SARS-CoV-2 and other Sarbecovirus currently known to infect humans.  If clinically indicated additional testing with an alternate test  methodology [phone]) is advised. The SARS-CoV-2 RNA is generally  detectable in upper and lower respiratory sp ecimens during the acute  phase of infection. The expected result is Negative. Fact Sheet for Patients:  StrictlyIdeas.no Fact Sheet for Healthcare Providers: BankingDealers.co.za This test is not yet approved or cleared by the [place] FDA and has been authorized for detection and/or diagnosis of SARS-CoV-2 by FDA under an Emergency Use Authorization (EUA).  This EUA will remain in effect (meaning this test can be used) for the duration of the COVID-19 declaration under Section 564(b)(1) of the Act, 21 U.S.C. section 360bbb-3(b)(1), unless the authorization is terminated or revoked sooner. Performed at [facility], [place] [address] [place], [place] [address]          Radiology Studies: Ct Abdomen Pelvis Wo Contrast  Result  Date: 11/10/2018 CLINICAL DATA:  48 year old male with left-sided back pain. Acute renal failure. Concern for kidney stone. EXAM: CT ABDOMEN AND PELVIS WITHOUT CONTRAST TECHNIQUE: Multidetector CT imaging of the abdomen and pelvis was performed following the standard protocol without IV contrast. COMPARISON:  None. FINDINGS: Evaluation of this exam is limited in the absence of intravenous contrast. Evaluation is also limited due to respiratory motion artifact. Lower chest: Moderate bronchiectatic changes in the left lower lobe. There is hypoattenuation of the cardiac blood pool suggestive of a degree of anemia. Clinical correlation is recommended. No intra-abdominal free air or free fluid Hepatobiliary:  No focal liver abnormality is seen. No gallstones, gallbladder wall thickening, or biliary dilatation. Pancreas: Unremarkable. No pancreatic ductal dilatation or surrounding inflammatory changes. Spleen: Normal in size without focal abnormality. Adrenals/Urinary Tract: Adrenal glands are unremarkable. Kidneys are normal, without renal calculi, focal lesion, or hydronephrosis. Bladder is unremarkable. Stomach/Bowel: There is moderate stool throughout the colon. There is no bowel obstruction or active inflammation. Normal appendix. Vascular/Lymphatic: The abdominal aorta and IVC are unremarkable. Minimal atherosclerotic calcification of the iliac arteries. No portal venous gas. There is no adenopathy. Reproductive: The prostate and seminal vesicles are grossly unremarkable. No pelvic mass. Other: None Musculoskeletal: No acute or significant osseous findings. IMPRESSION: 1. No acute intra-abdominal or pelvic pathology. No hydronephrosis or nephrolithiasis. 2. Moderate colonic stool burden. No bowel obstruction or active inflammation. Normal appendix. Electronically Signed   By: Anner Crete M.D.   On: 11/10/2018 22:30   Dg Chest Portable 1 View  Result Date: 11/10/2018 CLINICAL DATA:  Cough and shortness of  breath EXAM: PORTABLE CHEST 1 VIEW COMPARISON:  11/21/2017 and prior radiographs FINDINGS: The cardiomediastinal silhouette is unremarkable. Chronic pulmonary changes/scarring within the LOWER lungs, LEFT-greater-than-RIGHT, again noted. There is no evidence of focal airspace disease, pulmonary edema, suspicious pulmonary nodule/mass, pleural effusion, or pneumothorax. No acute bony abnormalities are identified. IMPRESSION: No evidence of acute cardiopulmonary disease. Electronically Signed   By: Margarette Canada M.D.   On: 11/10/2018 21:19        Scheduled Meds:  sodium chloride   Intravenous Once   Darunavir-Cobicisctat-Emtricitabine-Tenofovir Alafenamide  1 tablet Oral Q breakfast   dolutegravir  50 mg Oral Daily   insulin aspart  0-9 Units Subcutaneous TID WC   insulin aspart protamine- aspart  10 Units Subcutaneous Q supper   insulin aspart protamine- aspart  15 Units Subcutaneous Q breakfast   [START ON 11/13/2018] peg 3350 powder  0.5 kit Oral Once   And   [START ON 11/13/2018] peg 3350 powder  0.5 kit Oral Once   polyethylene glycol  17 g Oral BID   polyethylene glycol  17 g Oral BID   senna  2 tablet Oral Daily   Continuous Infusions:  ferumoxytol Stopped (11/11/18 1731)     LOS: 2 days     Vernell Leep, MD, FACP, [facility]. [facility]  To contact the attending provider between 7A-7P or the covering provider during after hours 7P-7A, please log into the web site [email or web address] and access using universal [place] password for that web site. If you do not have the password, please call the hospital operator.  11/12/2018, 4:53 PM

## 2018-11-12 NOTE — Progress Notes (Signed)
Up to [facility] amt of liquid bright red blood mix with BM. Per interpreter: Pt states he has been having bleeding when he strains to have BM. States that he has had hemorrhoids in the past.

## 2018-11-12 NOTE — Progress Notes (Signed)
CRITICAL VALUE ALERT  Critical Value:  Hgb 6.7  Date & Time Notied:  11/12/2018 at [facility]  Provider Notified: [facility] paged via [place] at 0431 on 11/12/2018  Orders Received/Actions taken: awaiting orders

## 2018-11-12 NOTE — Plan of Care (Signed)

## 2018-11-12 NOTE — Progress Notes (Signed)
Blood consent obtained with asistance of [name] interpreter.  After blood consent obtained RN changed patient's dressing.  Genital warts cleansed with soap and water, rinsed, pat dry and Aquacel AG applied (3 packages used to cover areas) followed by ABD pads and secured with mesh underwear.

## 2018-11-12 NOTE — Consult Note (Addendum)
Consultation  Referring Provider: [facility]/ Hongalgi Primary Care Physician:  [name], MD Primary Gastroenterologist:  None/unassigned  Reason for Consultation: Iron deficiency anemia and hematochezia  HPI: [name] is a 48 y.o. African male who speaks [name].  He has history of HIV, treated TB, prior hepatitis and known very large anal condyloma. He is followed by the ID clinic here and on treatment for HIV. He was admitted 11/10/2018 after presenting to the ED with complaints of weakness over the past month, then onset of intractable nausea and vomiting.  He was found to have new diagnosis of adult onset diabetes mellitus with DKA and acute kidney injury.  He was started on an insulin drip and IV fluids and DKA has resolved.  He is now on insulin. Also noted on admission to have severe anemia with hemoglobin of 7.5 and MCV of 72. Reviewing his records hemoglobin was 9.3 in June 2020 and 10.9 in March 2020. Serum iron is 10/TIBC 407/sat 2/ferritin 7 and B12 normal at 794.  CT of the abdomen pelvis 11/10/2018- other than increased stool burden.  Today hemoglobin down to 6.7 and he has received transfusion. Patient had an episode of bright red blood per rectum last night with fairly large amount per patient.  He has not had any further episodes since.  Speaking to the patient via [name] interpreter, patient says he has been having rectal bleeding at home over the past month with bowel movements.  Blood is red and has been occurring in larger amounts.  He knows he has "wounds" around his anus which hurt.  He does have pain with bowel movements and straining.  He has recently been having some more generalized abdominal pain after eating.  He gives vague history of dysphasia but only when asked. Patient has not had prior colonoscopy. No family history of colon cancer that he is aware of.  I reviewed records and Care Everywhere and patient was seen by Dr. [name], General  Surgery at [facility] earlier this year for evaluation of very large bulky anal condyloma. DRE could not be done due to bulky condyloma.  It was the surgeon's opinion that patient may require a colostomy for management as removal of the condyloma were highly likely to cause a significant anal stricture.  Patient did not want to have a colostomy, and has not been seen at [facility] since March.   Past Medical History:  Diagnosis Date  . Depression    "stress and depression for any man is common" (03/21/2014)  . Genital warts 01/04/2017  . Hepatitis    "I don't know what hepatitis I have"  . HIV disease ([name])   . TB (pulmonary tuberculosis)    previously treated according to refugee documentation    Past Surgical History:  Procedure Laterality Date  . FRACTURE SURGERY    . IM NAILING TIBIA Right 03/21/2014  . ORIF ANKLE FRACTURE Right 03/21/2014   lateral malleolus/notes 03/21/2014  . ORIF ANKLE FRACTURE Right 03/21/2014   Procedure: OPEN REDUCTION INTERNAL FIXATION (ORIF) pilon ;  Surgeon: Eldred Manges, MD;  Location: [facility] OR;  Service: Orthopedics;  Laterality: Right;  . TIBIA IM NAIL INSERTION Right 03/21/2014   Procedure: INTRAMEDULLARY (IM) NAIL TIBIAL;  Surgeon: Eldred Manges, MD;  Location: [facility] OR;  Service: Orthopedics;  Laterality: Right;  Marland Kitchen VIDEO BRONCHOSCOPY Bilateral 03/02/2016   Procedure: VIDEO BRONCHOSCOPY WITHOUT FLUORO;  Surgeon: Roslynn Amble, MD;  Location: [facility] ENDOSCOPY;  Service: Cardiopulmonary;  Laterality:  Bilateral;    Prior to Admission medications   Medication Sig Start Date End Date Taking? Authorizing Provider  Darunavir-Cobicisctat-Emtricitabine-Tenofovir Alafenamide (SYMTUZA) 800-150-200-10 MG TABS Take 1 tablet by mouth daily with breakfast. 06/06/18  Yes Kuppelweiser, Cassie L, RPH-CPP  dolutegravir (TIVICAY) 50 MG tablet Take 1 tablet (50 mg total) by mouth daily. 06/06/18  Yes Kuppelweiser, Cassie L, RPH-CPP  lidocaine (XYLOCAINE) 5 % ointment Apply 1 application topically 3  (three) times daily as needed. Apply over genital area for pain relief. Patient taking differently: Apply 1 application topically 3 (three) times daily as needed for mild pain.  04/26/18  Yes Cardama, Amadeo GarnetPedro Eduardo, MD    Current Facility-Administered Medications  Medication Dose Route Frequency Provider Last Rate Last Dose  . 0.9 %  sodium chloride infusion (Manually program via Guardrails IV Fluids)   Intravenous Once Bodenheimer, Charles A, NP      . acetaminophen (TYLENOL) tablet 650 mg  650 mg Oral Q6H PRN Elease EtienneHongalgi, Anand D, MD   650 mg at 11/12/18 1131  . Darunavir-Cobicisctat-Emtricitabine-Tenofovir Alafenamide (SYMTUZA) 800-150-200-10 MG TABS 1 tablet  1 tablet Oral Q breakfast Berton Mountgbata, Sylvester I, MD   1 tablet at 11/12/18 [id number]  . dolutegravir (TIVICAY) tablet 50 mg  50 mg Oral Daily Berton Mountgbata, Sylvester I, MD   50 mg at 11/12/18 0843  . enoxaparin (LOVENOX) injection 40 mg  40 mg Subcutaneous Q24H Berton Mountgbata, Sylvester I, MD   40 mg at 11/11/18 0840  . ferumoxytol (FERAHEME) 510 mg in sodium chloride 0.9 % 100 mL IVPB  510 mg Intravenous Weekly Elease EtienneHongalgi, Anand D, MD   Stopped at 11/11/18 1731  . guaiFENesin-dextromethorphan (ROBITUSSIN DM) 100-10 MG/5ML syrup 5 mL  5 mL Oral Q4H PRN Elease EtienneHongalgi, Anand D, MD   5 mL at 11/12/18 1131  . insulin aspart (novoLOG) injection 0-9 Units  0-9 Units Subcutaneous TID WC Elease EtienneHongalgi, Anand D, MD   5 Units at 11/12/18 [phone]  . insulin aspart protamine- aspart (NOVOLOG MIX 70/30) injection 10 Units  10 Units Subcutaneous Q supper Hongalgi, Anand D, MD      . insulin aspart protamine- aspart (NOVOLOG MIX 70/30) injection 15 Units  15 Units Subcutaneous Q breakfast Elease EtienneHongalgi, Anand D, MD   15 Units at 11/12/18 1132  . lidocaine (XYLOCAINE) 5 % ointment 1 application  1 application Topical TID PRN Berton Mountgbata, Sylvester I, MD      . polyethylene glycol (MIRALAX / GLYCOLAX) packet 17 g  17 g Oral BID Elease EtienneHongalgi, Anand D, MD   17 g at 11/12/18 1131  . senna (SENOKOT) tablet 17.2  mg  2 tablet Oral Daily Elease EtienneHongalgi, Anand D, MD   17.2 mg at 11/11/18 0841    Allergies as of 11/10/2018  . (No Known Allergies)    Family History  Problem Relation Age of Onset  . Hypertension Other   . Heart disease Sister     Social History   Socioeconomic History  . Marital status: Single    Spouse name: Not on file  . Number of children: Not on file  . Years of education: Not on file  . Highest education level: Not on file  Occupational History  . Not on file  Social Needs  . Financial resource strain: Not on file  . Food insecurity    Worry: Not on file    Inability: Not on file  . Transportation needs    Medical: Not on file    Non-medical: Not on file  Tobacco Use  . Smoking  status: Former Smoker    Packs/day: 0.50    Years: 6.00    Pack years: 3.00    Types: Cigarettes    Quit date: 03/16/2001    Years since quitting: 17.6  . Smokeless tobacco: Never Used  . Tobacco comment: "quit smoking ~ 2003"  Substance and Sexual Activity  . Alcohol use: Yes    Comment: drinks bottled beer intermittently  . Drug use: No  . Sexual activity: Not Currently    Partners: Female  Lifestyle  . Physical activity    Days per week: Not on file    Minutes per session: Not on file  . Stress: Not on file  Relationships  . Social Herbalist on phone: Not on file    Gets together: Not on file    Attends religious service: Not on file    Active member of club or organization: Not on file    Attends meetings of clubs or organizations: Not on file    Relationship status: Not on file  . Intimate partner violence    Fear of current or ex partner: Not on file    Emotionally abused: Not on file    Physically abused: Not on file    Forced sexual activity: Not on file  Other Topics Concern  . Not on file  Social History Narrative   As of 09/18/2013:   -Arrived in [place]: September 05, 2013    -Refugee from [employer], spent 4 years in refugee camp in [place].    -Language: [name],  requires Pharmacist, community (speaks essentially no [place])   -Education: no formal education, previously worked as a Gaffer: family phone number [phone], caseworker is Laqueta Linden Dar Bi [phone] (with [employer])   -lives with daughter and three sons   -denies alcohol, drug, tobacco abuse      [facility] (02/28/16):   Patient now admitted to drinking bottled beer. Reports continued tobacco abstinence. Denies any bird or mold exposure. No pets currently. No recent travel.    Review of Systems: Pertinent positive and negative review of systems were noted in the above HPI section.  All other review of systems was otherwise negative.Marland Kitchen  Physical Exam: Vital signs in last 24 hours: Temp:  [98 F (36.7 C)-98.7 F (37.1 C)] 98.6 F (37 C) (08/29 1129) Pulse Rate:  [93-119] 93 (08/29 1129) Resp:  [17-20] 17 (08/29 1129) BP: (118-132)/(65-91) 119/76 (08/29 1133) SpO2:  [91 %-99 %] 92 % (08/29 1129) Weight:  [79.9 kg] 79.9 kg (08/29 0417) Last BM Date: 11/11/18 General:   Alert,  Well-developed, thin African male, non-English-speaking pleasant and cooperative in NAD Head:  Normocephalic and atraumatic. Eyes:  Sclera clear, no icterus.   Conjunctiva pale Ears:  Normal auditory acuity. Nose:  No deformity, discharge,  or lesions. Mouth:  No deformity or lesions.   Neck:  Supple; no masses or thyromegaly. Lungs:  Clear throughout to auscultation.   No wheezes, crackles, or rhonchi. Heart:  Regular rate and rhythm; no murmurs, clicks, rubs,  or gallops. Abdomen:  Soft,, no focal tenderness, bowel sounds are present no palpable mass or hepatosplenomegaly Rectal:  Deferred - documented by Dr. Glenford Bayley to have multiple large bulky anal and perianal condyloma, unable to do digital exam.  Also has penile condyloma Msk:  Symmetrical without gross deformities. . Pulses:  Normal pulses noted. Extremities:  Without clubbing or edema. Neurologic:  Alert  and  oriented x4;  grossly normal neurologically.  Skin:  Intact without significant lesions or rashes.. Psych:  Alert and cooperative. Normal mood and affect.  Intake/Output from previous day: 08/28 0701 - 08/29 0700 In: 2227.3 [P.O.:978; I.V.:1132.3; IV Piggyback:117] Out: 2350 [Urine:2350] Intake/Output this shift: Total I/O In: -  Out: 375 [Urine:375]  Lab Results: Recent Labs    11/10/18 2344 11/11/18 0151 11/12/18 0322  WBC 9.0 9.9 6.5  HGB 7.5* 7.8* 6.7*  HCT 25.1* 25.4* 22.9*  PLT 303 280 252   BMET Recent Labs    11/11/18 0151 11/11/18 0821 11/12/18 0322  NA 139 138 135  K 4.0 3.4* 4.1  CL 103 105 104  CO2 24 24 24   GLUCOSE 362* 156* 283*  BUN 50* 41* 23*  CREATININE 1.57* 1.28* 1.11  CALCIUM 10.8* 10.0 9.3   LFT Recent Labs    11/12/18 0322  PROT 7.1  ALBUMIN 2.4*  AST 19  ALT 11  ALKPHOS 75  BILITOT 0.5    IMPRESSION:  #1061 48 year old African male with HIV on treatment, admitted 11/10/2026 with weakness and intractable nausea and vomiting and found to have new diagnosis of adult onset diabetes mellitus with DKA. He has been treated and DKA has resolved, now starting insulin  #2 acute kidney injury secondary to above  #3 Marked microcytic anemia, iron deficiency, and episode of hematochezia last evening.  Patient relates rectal bleeding with bowel movements over the past month and increased straining.  #4 very large bulky anal and perianal condyloma.  [facility] Dr. Durenda HurtGregory Waters, General Surgery, earlier this year and declined surgery at that time.  Suspect his anemia, iron deficiency and bleeding are secondary to the multiple large anal condyloma.  He may have an anal stricture associated, and also could have an associated anal rectal cancer.  #5 history of TB treated  Plan:  Patient will need colonoscopy, consider peds colonoscope if anal stricture. Will start MiraLAX 17 g in 8 ounces of water twice daily today,  bowel prep tomorrow and schedule colonoscopy for Monday with Dr. Orvan FalconerBeavers Continue serial hemoglobins and transfuse for hemoglobin less than 7 Patient received IV Feraheme today and will need a second dose in 1 week. It is very likely that patient will need resection of the anal condyloma to prevent ongoing bleeding.  This may require diverting colostomy. This will be addressed after colonoscopy, pending any additional findings. I discussed the colonoscopy in detail with patient including indications risks and benefits via the [name] interpreter and he is agreeable to proceed and seems to understand his current issues.   Amy Esterwood PA-C 11/12/2018, 2:37 PM      Attending Physician Note   I have taken a history, examined the patient and reviewed the chart. I agree with the Advanced Practitioner's note, impression and recommendations.  Asked to evaluate hematochezia and iron deficiency anemia in patient with HIV, newly diagnosed DM, resolving DKA, bulky anogenital condyloma. [facility] Mam[facility]versGregory Water [facility] for anogenital condyloma recommended debulking and possible colostomy due to potential of an anal stricture. R/O bleeding condyloma, anal cancer, hemorrhoids, etc. Colonoscopy on Monday. DRE under sedation at time of colonoscopy. IV Feraheme given and plan to repeat dose in 3-8 days. Begin Miralax today, bowel prep tomorrow. [facility].     Claudette HeadMalcolm Sadey Yandell, MD [facility]

## 2018-11-13 DIAGNOSIS — E1165 Type 2 diabetes mellitus with hyperglycemia: Secondary | ICD-10-CM

## 2018-11-13 LAB — CBC
HCT: 27.5 % — ABNORMAL LOW (ref 39.0–52.0)
Hemoglobin: 8.3 g/dL — ABNORMAL LOW (ref 13.0–17.0)
MCH: 22.7 pg — ABNORMAL LOW (ref 26.0–34.0)
MCHC: 30.2 g/dL (ref 30.0–36.0)
MCV: 75.3 fL — ABNORMAL LOW (ref 80.0–100.0)
Platelets: 250 10*3/uL (ref 150–400)
RBC: 3.65 MIL/uL — ABNORMAL LOW (ref 4.22–5.81)
RDW: 15.9 % — ABNORMAL HIGH (ref 11.5–15.5)
WBC: 7.8 10*3/uL (ref 4.0–10.5)
nRBC: 0 % (ref 0.0–0.2)

## 2018-11-13 LAB — TYPE AND SCREEN
ABO/RH(D): O POS
Antibody Screen: NEGATIVE
Unit division: 0

## 2018-11-13 LAB — BASIC METABOLIC PANEL
Anion gap: 9 (ref 5–15)
BUN: 14 mg/dL (ref 6–20)
CO2: 26 mmol/L (ref 22–32)
Calcium: 9.6 mg/dL (ref 8.9–10.3)
Chloride: 100 mmol/L (ref 98–111)
Creatinine, Ser: 1.09 mg/dL (ref 0.61–1.24)
GFR calc Af Amer: >60 mL/min (ref >60)
GFR calc non Af Amer: >60 mL/min (ref >60)
Glucose, Bld: 202 mg/dL — ABNORMAL HIGH (ref 70–99)
Potassium: 3.8 mmol/L (ref 3.5–5.1)
Sodium: 135 mmol/L (ref 135–145)

## 2018-11-13 LAB — BPAM RBC
Blood Product Expiration Date: 202009242359
ISSUE DATE / TIME: 202008291056
Unit Type and Rh: 5100

## 2018-11-13 LAB — GLUCOSE, CAPILLARY
Glucose-Capillary: 186 mg/dL — ABNORMAL HIGH (ref 70–99)
Glucose-Capillary: 252 mg/dL — ABNORMAL HIGH (ref 70–99)
Glucose-Capillary: 268 mg/dL — ABNORMAL HIGH (ref 70–99)
Glucose-Capillary: 253 mg/dL — ABNORMAL HIGH (ref 70–99)

## 2018-11-13 MED ORDER — INSULIN ASPART PROT & ASPART (70-30 MIX) 100 UNIT/ML ~~LOC~~ SUSP
18.0000 [IU] | Freq: Every day | SUBCUTANEOUS | Status: DC
  Administered 2018-11-13 – 2018-11-15 (×2): 18 [IU] via SUBCUTANEOUS
  Filled 2018-11-13: qty 10

## 2018-11-13 MED ORDER — LORATADINE 10 MG PO TABS
10.0000 mg | ORAL_TABLET | Freq: Every day | ORAL | Status: DC
  Administered 2018-11-13: 10 mg via ORAL
  Filled 2018-11-13: qty 1

## 2018-11-13 MED ORDER — MORPHINE SULFATE (PF) 2 MG/ML IV SOLN
2.0000 mg | Freq: Once | INTRAVENOUS | Status: AC
  Administered 2018-11-13: 2 mg via INTRAVENOUS
  Filled 2018-11-13: qty 1

## 2018-11-13 MED ORDER — INSULIN ASPART PROT & ASPART (70-30 MIX) 100 UNIT/ML ~~LOC~~ SUSP
18.0000 [IU] | Freq: Every day | SUBCUTANEOUS | Status: DC
  Administered 2018-11-13 – 2018-11-14 (×2): 18 [IU] via SUBCUTANEOUS
  Filled 2018-11-13: qty 10

## 2018-11-13 MED ORDER — LORATADINE 10 MG PO TABS: 10.0000 mg | ORAL_TABLET | Freq: Every day | ORAL | Status: DC | PRN

## 2018-11-13 MED ORDER — ONDANSETRON HCL 4 MG/2ML IJ SOLN
4.0000 mg | Freq: Four times a day (QID) | INTRAMUSCULAR | Status: DC | PRN
  Administered 2018-11-13: 4 mg via INTRAVENOUS
  Filled 2018-11-13: qty 2

## 2018-11-13 MED ORDER — GUAIFENESIN-DM 100-10 MG/5ML PO SYRP
10.0000 mL | ORAL_SOLUTION | ORAL | Status: DC | PRN
  Administered 2018-11-13 – 2018-11-15 (×3): 10 mL via ORAL
  Filled 2018-11-13 (×3): qty 10

## 2018-11-13 MED ORDER — PANTOPRAZOLE SODIUM 40 MG PO TBEC
40.0000 mg | DELAYED_RELEASE_TABLET | Freq: Every day | ORAL | Status: DC
  Administered 2018-11-13 – 2018-11-15 (×3): 40 mg via ORAL
  Filled 2018-11-13 (×3): qty 1

## 2018-11-13 NOTE — Plan of Care (Signed)
  Problem: Clinical Measurements: Goal: Ability to maintain clinical measurements within normal limits will improve Outcome: Progressing Goal: Will remain free from infection Outcome: Progressing Goal: Diagnostic test results will improve Outcome: Progressing Goal: Cardiovascular complication will be avoided Outcome: Progressing   Problem: Nutrition: Goal: Adequate nutrition will be maintained Outcome: Progressing   

## 2018-11-13 NOTE — Progress Notes (Signed)
Patient had vomiting episode.  Advise patient to take the colon prep slowly to decrease nausea and vomit.  Patient finished one bottle.  2nd will start at 10pm.  I will keep monitoring patient.

## 2018-11-13 NOTE — Progress Notes (Signed)
PROGRESS NOTE   [name]  WUJ:[id number]    DOB: Jun 09, 1970    DOA: 11/10/2018  PCP: Dickie La, MD   I have briefly reviewed patients previous medical records in [facility].  Chief Complaint  Patient presents with  . Kidney Pain  . Emesis    Brief Narrative:  48 year old [name] speaking male with PMH of HIV under reasonable control, bulky genital warts awaiting extensive surgery at [facility] delayed due to [place] pandemic, TB, hepatitis, depression presented to [facility] ED on 11/10/2018 due to 2-day history of nausea, vomiting, abdominal pain, constipation and weakness.  In ED, he had blood glucose of 1196, sodium 119, potassium 6.3, bicarbonate 19, BUN 56, creatinine 1.93, anion gap 17.  He was admitted for newly diagnosed DM with DKA, dehydration, acute renal failure.  CT abdomen without acute findings.  Briefly on IV insulin drip, transitioned to Lantus and SSI after DKA resolved.  ID consulted for HIV and bulky genital warts but this will be deferred to outpatient follow-up.  Rectal bleeding noted 8/29, Hb down to 6.7, transfusing 1 unit PRBC, [place] [facility] consulted and plan colonoscopy 8/31.   Assessment & Plan:   Principal Problem:   DKA (diabetic ketoacidoses) ([place]) Active Problems:   HIV (human immunodeficiency virus infection) ([place])   AKI (acute kidney injury) ([place])   Condyloma   Newly diagnosed DM 2 presented with DKA.  Admission labs: Sodium 119, potassium 6.3, bicarbonate 19, glucose 1196, BUN 56, creatinine 1.93 and anion gap 17.  A1c: 9.5  Treated with IV fluids and IV insulin per DKA protocol with frequent BMP monitoring.  DKA resolved.  Patient was briefly switched to Lantus but due to lack of insurance and affordability, changed this to 70/30 insulin.  CBGs better but still uncontrolled.  Increased 70/30 insulin to 18 units twice daily.  Continue NovoLog sensitive SSI without bedtime scale.  Rectal bleeding  On 8/29 patient developed small  volume bright red bleeding per rectum.  He reports ongoing intermittent such episodes at home for the last month upon straining.  Does report some rectal pain.  DD: Related to bulky anal area warts, hemorrhoids, malignancy and other etiologies.  [place] [facility] consulted and plan colonoscopy 8/31.  No further rectal bleeding since yesterday morning.  Dehydration with hyponatremia  Secondary to osmotic diuresis.  Hyponatremia also due to hyperglycemia/pseudohyponatremia.  Resolved.  Acute renal failure  Secondary to dehydration.  No hydronephrosis or nephrolithiasis by CT abdomen without contrast.  Resolved after IV fluids.  Hypokalemia  Replaced.  Iron deficiency anemia  May be multifactorial related to nutritional deficiency, suspect slow blood loss from large genital warts and recurrent episodes of small-volume rectal bleeding.  Anemia panel: Iron 10, TIBC 407, saturation ratio 2, ferritin 7, B12: 794.  Getting IV Feraheme  Hemoglobin dropped to 6.7 on 8/29, s/p 1 unit PRBC and improved to 7.7 >8.3.   Follow CBC daily.  HIV  ID input appreciated.  Continue Symtuza and Tivikay  Bulky genital warts  ID input appreciated.  ID will coordinate with surgeons at [facility] regarding timing of surgery.  As per  GI, patient had been evaluated at [facility] by Dr. Cheryll Cockayne, General Surgery but he declined surgery at that time.  Social issues  As per son's report to RN, patient lives home alone and has no one to care for him while he has been sick and unable to care for himself for a long time.  Social work and case management consulted.  Constipation  Started bowel regimen.  Now undergoing bowel prep for possible colonoscopy tomorrow.  DVT prophylaxis: Discontinued Lovenox due to rectal bleeding.  SCDs. Code Status: Full Family Communication: I discussed in detail with patient's son via phone on 8/29, updated care and answered all questions. Disposition: DC home  pending clinical improvement   Consultants:  ID  GI  Procedures:  None  Antimicrobials:  HIV meds as noted above   Subjective: Unable to use interpreter due to technical difficulties.  Patient/RN report no further rectal bleeding since yesterday morning and no other complaints.  Ongoing intermittent dry cough.  Objective:  Vitals:   11/13/18 0335 11/13/18 0700 11/13/18 0811 11/13/18 1117  BP: 128/82  117/77 121/71  Pulse: (!) 102  (!) 111 (!) 107  Resp: (!) 22 20 (!) 25 (!) 23  Temp: 98.8 F (37.1 C)  98.3 F (36.8 C) 98.7 F (37.1 C)  TempSrc: Oral   Oral  SpO2: 95%  93% 98%  Weight: 78.7 kg       Examination:  General exam: Young male, moderately built and thinly nourished lying comfortably supine in bed.  Oral mucosa moist. Respiratory system: Clear to auscultation.  No increased work of breathing. Cardiovascular system: S1 and S2 heard, mild regular tachycardia.  No JVD, murmurs or pedal edema.  Telemetry personally reviewed: SR-ST in the 100s. Gastrointestinal system: Abdomen is nondistended, soft and nontender. No organomegaly or masses felt. Normal bowel sounds heard. GU: Large bulky warts covering his perineum from his rectum to his scrotum with malodorous drainage.  No bleeding. Central nervous system: Alert and oriented. No focal neurological deficits. Extremities: Symmetric 5 x 5 power. Skin: No rashes, lesions or ulcers Psychiatry: Judgement and insight appear normal. Mood & affect appropriate.     Data Reviewed: I have personally reviewed following labs and imaging studies  CBC: Recent Labs  Lab 11/10/18 1820 11/10/18 2344 11/12/18 0322 11/12/18 1630 11/12/18 2152 11/13/18 0432  WBC 8.7 9.0 6.5  --  7.3 7.8  NEUTROABS  --  7.2  --   --   --   --   HGB 7.7* 7.5* 6.7* 7.7* 7.8* 8.3*  HCT 28.8* 25.1* 22.9* 26.0* 25.5* 27.5*  MCV 79.6* 72.5* 73.6*  --  74.6* 75.3*  PLT 312 303 252  --  252 539   Basic Metabolic Panel: Recent Labs  Lab  11/10/18 2343 11/10/18 2344 11/11/18 0151 11/11/18 0821 11/12/18 0322 11/13/18 0432  NA 138  --  139 138 135 135  K 4.1  --  4.0 3.4* 4.1 3.8  CL 104  --  103 105 104 100  CO2 24  --  _0 GLUCOSE 606*  --  362* 156* 283* 202*  BUN 54*  --  50* 41* 23* 14  CREATININE 1.69*  --  1.57* 1.28* 1.11 1.09  CALCIUM 10.1  --  10.8* 10.0 9.3 9.6  MG  --  2.7* 2.7*  --  1.8  --   PHOS 2.8  --  3.5  --   --   --    Liver Function Tests: Recent Labs  Lab 11/10/18 1820 11/10/18 2343 11/12/18 0322  AST 16  --  19  ALT 15  --  11  ALKPHOS 178*  --  75  BILITOT 0.2*  --  0.5  PROT 8.6*  --  7.1  ALBUMIN 3.2* 3.0* 2.4*    Cardiac Enzymes: No results for input(s): CKTOTAL, CKMB, CKMBINDEX, TROPONINI in the  last 168 hours.  CBG: Recent Labs  Lab 11/12/18 1134 11/12/18 1825 11/12/18 2106 11/13/18 0811 11/13/18 1116  GLUCAP 308* 158* 268* 186* 252*    Recent Results (from the past 240 hour(s))  SARS Coronavirus 2 [facility] order, Performed in [facility] hospital lab) Nasopharyngeal Nasopharyngeal Swab     Status: None   Collection Time: 11/10/18 10:19 PM   Specimen: Nasopharyngeal Swab  Result Value Ref Range Status   SARS Coronavirus 2 NEGATIVE NEGATIVE Final    Comment: (NOTE) If result is NEGATIVE SARS-CoV-2 target nucleic acids are NOT DETECTED. The SARS-CoV-2 RNA is generally detectable in upper and lower  respiratory specimens during the acute phase of infection. The lowest  concentration of SARS-CoV-2 viral copies this assay can detect is 250  copies / mL. A negative result does not preclude SARS-CoV-2 infection  and should not be used as the sole basis for treatment or other  patient management decisions.  A negative result may occur with  improper specimen collection / handling, submission of specimen other  than nasopharyngeal swab, presence of viral mutation(s) within the  areas targeted by this assay, and inadequate number of viral copies  (<250 copies  / mL). A negative result must be combined with clinical  observations, patient history, and epidemiological information. If result is POSITIVE SARS-CoV-2 target nucleic acids are DETECTED. The SARS-CoV-2 RNA is generally detectable in upper and lower  respiratory specimens dur ing the acute phase of infection.  Positive  results are indicative of active infection with SARS-CoV-2.  Clinical  correlation with patient history and other diagnostic information is  necessary to determine patient infection status.  Positive results do  not rule out bacterial infection or co-infection with other viruses. If result is PRESUMPTIVE POSTIVE SARS-CoV-2 nucleic acids MAY BE PRESENT.   A presumptive positive result was obtained on the submitted specimen  and confirmed on repeat testing.  While 2019 novel coronavirus  (SARS-CoV-2) nucleic acids may be present in the submitted sample  additional confirmatory testing may be necessary for epidemiological  and / or clinical management purposes  to differentiate between  SARS-CoV-2 and other Sarbecovirus currently known to infect humans.  If clinically indicated additional testing with an alternate test  methodology [phone]) is advised. The SARS-CoV-2 RNA is generally  detectable in upper and lower respiratory sp ecimens during the acute  phase of infection. The expected result is Negative. Fact Sheet for Patients:  StrictlyIdeas.no Fact Sheet for Healthcare Providers: BankingDealers.co.za This test is not yet approved or cleared by the [place] FDA and has been authorized for detection and/or diagnosis of SARS-CoV-2 by FDA under an Emergency Use Authorization (EUA).  This EUA will remain in effect (meaning this test can be used) for the duration of the COVID-19 declaration under Section 564(b)(1) of the Act, 21 U.S.C. section 360bbb-3(b)(1), unless the authorization is terminated or revoked sooner.  Performed at [facility], [place] [address], [place], [place] [address]          Radiology Studies: No results found.      Scheduled Meds: . sodium chloride   Intravenous Once  . Darunavir-Cobicisctat-Emtricitabine-Tenofovir Alafenamide  1 tablet Oral Q breakfast  . dolutegravir  50 mg Oral Daily  . insulin aspart  0-9 Units Subcutaneous TID WC  . insulin aspart protamine- aspart  15 Units Subcutaneous Q supper  . insulin aspart protamine- aspart  18 Units Subcutaneous Q breakfast  . peg 3350 powder  0.5 kit Oral Once  And  . peg 3350 powder  0.5 kit Oral Once  . polyethylene glycol  17 g Oral BID  . polyethylene glycol  17 g Oral BID  . senna  2 tablet Oral Daily   Continuous Infusions: . ferumoxytol Stopped (11/11/18 1731)     LOS: 3 days     Vernell Leep, MD, FACP, [facility]. [facility]  To contact the attending provider between 7A-7P or the covering provider during after hours 7P-7A, please log into the web site [email or web address] and access using universal [place] password for that web site. If you do not have the password, please call the hospital operator.  11/13/2018, 4:17 PM

## 2018-11-14 ENCOUNTER — Telehealth: Payer: Self-pay

## 2018-11-14 ENCOUNTER — Encounter (HOSPITAL_COMMUNITY): Payer: Self-pay | Admitting: Certified Registered"

## 2018-11-14 ENCOUNTER — Inpatient Hospital Stay (HOSPITAL_COMMUNITY): Payer: Self-pay | Admitting: Certified Registered"

## 2018-11-14 ENCOUNTER — Encounter (HOSPITAL_COMMUNITY)
Admission: EM | Disposition: A | Discharge status: Home / Self Care | Payer: Self-pay | Source: Home / Self Care | Attending: Internal Medicine

## 2018-11-14 DIAGNOSIS — A630 Anogenital (venereal) warts: Secondary | ICD-10-CM

## 2018-11-14 LAB — FOLATE RBC
Folate, Hemolysate: 329 ng/mL
Folate, RBC: 1251 ng/mL (ref >498)
Hematocrit: 26.3 % — ABNORMAL LOW (ref 37.5–51.0)

## 2018-11-14 LAB — BASIC METABOLIC PANEL
Anion gap: 9 (ref 5–15)
BUN: 15 mg/dL (ref 6–20)
CO2: 24 mmol/L (ref 22–32)
Calcium: 9.5 mg/dL (ref 8.9–10.3)
Chloride: 102 mmol/L (ref 98–111)
Creatinine, Ser: 1.14 mg/dL (ref 0.61–1.24)
GFR calc Af Amer: >60 mL/min (ref >60)
GFR calc non Af Amer: >60 mL/min (ref >60)
Glucose, Bld: 169 mg/dL — ABNORMAL HIGH (ref 70–99)
Potassium: 3.3 mmol/L — ABNORMAL LOW (ref 3.5–5.1)
Sodium: 135 mmol/L (ref 135–145)

## 2018-11-14 LAB — CBC
HCT: 27 % — ABNORMAL LOW (ref 39.0–52.0)
Hemoglobin: 8.1 g/dL — ABNORMAL LOW (ref 13.0–17.0)
MCH: 22.8 pg — ABNORMAL LOW (ref 26.0–34.0)
MCHC: 30 g/dL (ref 30.0–36.0)
MCV: 76.1 fL — ABNORMAL LOW (ref 80.0–100.0)
Platelets: 247 10*3/uL (ref 150–400)
RBC: 3.55 MIL/uL — ABNORMAL LOW (ref 4.22–5.81)
RDW: 16.6 % — ABNORMAL HIGH (ref 11.5–15.5)
WBC: 12.5 10*3/uL — ABNORMAL HIGH (ref 4.0–10.5)
nRBC: 0 % (ref 0.0–0.2)

## 2018-11-14 LAB — GLUCOSE, CAPILLARY
Glucose-Capillary: 166 mg/dL — ABNORMAL HIGH (ref 70–99)
Glucose-Capillary: 154 mg/dL — ABNORMAL HIGH (ref 70–99)
Glucose-Capillary: 251 mg/dL — ABNORMAL HIGH (ref 70–99)
Glucose-Capillary: 207 mg/dL — ABNORMAL HIGH (ref 70–99)

## 2018-11-14 SURGERY — INVASIVE LAB ABORTED CASE
Anesthesia: Monitor Anesthesia Care

## 2018-11-14 MED ORDER — POTASSIUM CHLORIDE CRYS ER 20 MEQ PO TBCR
40.0000 meq | EXTENDED_RELEASE_TABLET | Freq: Once | ORAL | Status: AC
  Administered 2018-11-14: 40 meq via ORAL
  Filled 2018-11-14: qty 2

## 2018-11-14 MED ORDER — LIDOCAINE HCL (CARDIAC) PF 100 MG/5ML IV SOSY
PREFILLED_SYRINGE | INTRAVENOUS | Status: DC | PRN
  Administered 2018-11-14: 20 mg via INTRAVENOUS

## 2018-11-14 MED ORDER — LACTATED RINGERS IV SOLN
INTRAVENOUS | Status: DC | PRN
  Administered 2018-11-14: 14:00:00 via INTRAVENOUS

## 2018-11-14 MED ORDER — PROPOFOL 500 MG/50ML IV EMUL
INTRAVENOUS | Status: DC | PRN
  Administered 2018-11-14: 150 ug/kg/min via INTRAVENOUS

## 2018-11-14 MED ORDER — LACTATED RINGERS IV SOLN
INTRAVENOUS | Status: DC
  Administered 2018-11-14: 14:00:00 via INTRAVENOUS

## 2018-11-14 SURGICAL SUPPLY — 22 items

## 2018-11-14 NOTE — Transfer of Care (Signed)
Immediate Anesthesia Transfer of Care Note  Patient: [name]  Procedure(s) Performed: ABORTED CASE  Patient Location: Endoscopy Unit  Anesthesia Type:MAC  Level of Consciousness: awake and patient cooperative  Airway & Oxygen Therapy: Patient Spontanous Breathing and Patient connected to face mask oxygen  Post-op Assessment: Report given to RN, Post -op Vital signs reviewed and stable and Patient moving all extremities X 4  Post vital signs: Reviewed and stable  Last Vitals:  Vitals Value Taken Time  BP    Temp    Pulse    Resp    SpO2      Last Pain:  Vitals:   11/14/18 1354  TempSrc: Oral  PainSc: 9       Patients Stated Pain Goal: 0 (78/58/85 0277)  Complications: No apparent anesthesia complications

## 2018-11-14 NOTE — Progress Notes (Signed)
Addendum  I d/w Dr. 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 This is friable and a source of bleeding. No colonoscopy performed. I discussed with Dr. Tarri Glenn and Dr. Megan Salon, ID and given complexity of case which requires major team surgery by general surgery, urology and plastic surgery at a tertiary facility, large symptomatic warts with ongoing bleeding and hygiene issue and patient unable to take care of himself and unable to get plugged into early follow up at [facility] and language barriers, it was felt to try for a hospital to hospital transfer.  I called and discussed with Dr. Cheryll Cockayne, General Surgeon at [facility] who did not feel that the patient was appropriate for IP transfer and declined such but was happy to see him in the clinic on Thurs 9/3 and his office will call patient's son with appointment.  I called and discussed in detail with patient son and updated care including all this information.  He verbalized understanding.  Hopefully the patient can discharge tomorrow so he can keep that appointment.  Vernell Leep, MD, FACP, [facility]. [facility]  To contact the attending provider between 7A-7P or the covering provider during after hours 7P-7A, please log into the web site [email or web address] and access using universal [place] password for that web site. If you do not have the password, please call the hospital operator.

## 2018-11-14 NOTE — Progress Notes (Signed)
PROGRESS NOTE   [name]  ZOX:[id number]    DOB: 01/25/1971    DOA: 11/10/2018  PCP: Nestor RampNeal, Sara L, MD   I have briefly reviewed patients previous medical records in [facility].  Chief Complaint  Patient presents with   Kidney Pain   Emesis    Brief Narrative:  48 year old [name] speaking male with PMH of HIV under reasonable control, bulky genital warts awaiting extensive surgery at [facility] delayed due to COVID pandemic, TB, hepatitis, depression presented [facility] ED on 11/10/2018 due to 2-day history of nausea, vomiting, abdominal pain, constipation and weakness.  In ED, he had blood glucose of 1196, sodium 119, potassium 6.3, bicarbonate 19, BUN 56, creatinine 1.93, anion gap 17.  He was admitted for newly diagnosed DM with DKA, dehydration, acute renal failure.  CT abdomen without acute findings.  Briefly on IV insulin drip, transitioned to Lantus and SSI after DKA resolved.  ID consulted for HIV and bulky genital warts but this will be deferred to outpatient follow-up.  Rectal bleeding noted 8/29, Hb down to 6.7, transfusing 1 unit PRBC, [place] [facility] consulted and plan colonoscopy 8/31.   Assessment & Plan:   Principal Problem:   DKA (diabetic ketoacidoses) ([name]) Active Problems:   HIV (human immunodeficiency virus infection) ([name])   AKI (acute kidney injury) ([name])   Condyloma   Newly diagnosed DM 2 presented with DKA.  Admission labs: Sodium 119, potassium 6.3, bicarbonate 19, glucose 1196, BUN 56, creatinine 1.93 and anion gap 17.  A1c: 9.5  Treated with IV fluids and IV insulin per DKA protocol with frequent BMP monitoring.  DKA resolved.  Patient was briefly switched to Lantus but due to lack of insurance and affordability, changed this to 70/30 insulin.  Currently on 70/30 insulin at 18 units twice daily and SSI with improved control.  Monitor closely while NPO for procedure and this morning's 70/30 dose held.  Discussed with nursing.  Patient  reports that he has still not been educated regarding insulin self administration, discussed with nursing and to be done today.  Rectal bleeding  On 8/29 patient developed small volume bright red bleeding per rectum.  He reports ongoing intermittent such episodes at home for the last month upon straining.  Does report some rectal pain.  DD: Related to bulky anal area warts, hemorrhoids, malignancy and other etiologies.  [place] [facility] consulted and plan colonoscopy 8/31.  Underwent bowel prep last night.  Returns reportedly clear now with mild blood streaks.  Dehydration with hyponatremia  Secondary to osmotic diuresis.  Hyponatremia also due to hyperglycemia/pseudohyponatremia.  Resolved.  Acute renal failure  Secondary to dehydration.  No hydronephrosis or nephrolithiasis by CT abdomen without contrast.  Resolved after IV fluids.  Hypokalemia  Replaced.  Iron deficiency anemia  May be multifactorial related to nutritional deficiency, suspect slow blood loss from large genital warts and recurrent episodes of small-volume rectal bleeding.  Anemia panel: Iron 10, TIBC 407, saturation ratio 2, ferritin 7, B12: 794.  Getting IV Feraheme  Hemoglobin dropped to 6.7 on 8/29, s/p 1 unit PRBC and improved to 7.7 >8.3 >8.1.   Stable.  Continue to follow CBC daily.  HIV  ID input appreciated.  Continue Symtuza and Tivikay  Bulky genital warts  ID input appreciated.  ID will coordinate with surgeons at [facility] regarding timing of surgery.  I discussed with Dr. Orvan Falconerampbell, ID 8/31 who will have his office assist coordinating this follow-up and with ID clinic.  As per [facility], patient had been evaluated at [facility] by Dr. Cheryll Cockayne, General Surgery but he declined surgery at that time.  Social issues  As per son's report to RN, patient lives home alone and has no one to care for him while he has been sick and unable to care for himself for a long time.  Social work and case  management consulted.  Constipation  Started bowel regimen.  Underwent bowel prep for possible colonoscopy tomorrow.  Hypokalemia  Likely related to bowel prep last night.  Replace.  Follow BMP in a.m.  Mild sinus tachycardia  Asymptomatic.  Likely physiological due to anemia.  TSH normal.  DVT prophylaxis: Discontinued Lovenox due to rectal bleeding.  SCDs. Code Status: Full Family Communication: I discussed in detail with patient's son via phone on 8/29, updated care and answered all questions.  None at bedside today. Disposition: DC home pending clinical improvement, hopefully 9/1 provided uneventful colonoscopy, stable DM control and hemoglobin and patient educated on self-management of insulin/DM.   Consultants:  ID [facility]  Procedures:  None  Antimicrobials:  HIV meds as noted above   Subjective: Patient interviewed and examined using audio interpreter at bedside.  States that he had multiple BMs with bowel prep and the last ones were clear with some streaks of blood.  Occasional mild abdominal discomfort.  Denied any other complaints.  Aware that he is going for colonoscopy today.  Stated that he has not still not learned how to give himself his insulin.  Objective:  Vitals:   11/14/18 0023 11/14/18 0531 11/14/18 0808 11/14/18 0900  BP: 121/75 118/74 120/79   Pulse: (!) 113 (!) 118 (!) 106   Resp: 15 (!) 21  18  Temp: 97.9 F (36.6 C) 98.5 F (36.9 C)    TempSrc: Oral Oral    SpO2: 97% 96% 96%   Weight:        Examination:  General exam: Young male, moderately built and thinly nourished lying comfortably supine in bed.  Oral mucosa moist. Respiratory system: Clear to auscultation.  No increased work of breathing. Cardiovascular system: S1 and S2 heard, mild regular tachycardia.  No JVD, murmurs or pedal edema.  Telemetry personally reviewed: ST in the 100-110's Gastrointestinal system: Abdomen is nondistended, soft and nontender. No organomegaly or  masses felt. Normal bowel sounds heard. GU: Large bulky warts covering his perineum from his rectum to his scrotum with malodorous drainage.  No bleeding. Central nervous system: Alert and oriented. No focal neurological deficits. Extremities: Symmetric 5 x 5 power. Skin: No rashes, lesions or ulcers Psychiatry: Judgement and insight appear normal. Mood & affect appropriate.     Data Reviewed: I have personally reviewed following labs and imaging studies  CBC: Recent Labs  Lab 11/10/18 2344 11/12/18 0322 11/12/18 1630 11/12/18 2152 11/13/18 0432 11/14/18 0444  WBC 9.0 6.5  --  7.3 7.8 12.5*  NEUTROABS 7.2  --   --   --   --   --   HGB 7.5* 6.7* 7.7* 7.8* 8.3* 8.1*  HCT 25.1* 22.9* 26.0* 25.5* 27.5* 27.0*  MCV 72.5* 73.6*  --  74.6* 75.3* 76.1*  PLT 303 252  --  252 250 132   Basic Metabolic Panel: Recent Labs  Lab 11/10/18 2343 11/10/18 2344 11/11/18 0151 11/11/18 0821 11/12/18 0322 11/13/18 0432 11/14/18 0444  NA 138  --  139 138 135 135 135  K 4.1  --  4.0 3.4* 4.1 3.8 3.3*  CL 104  --  103  105 104 100 102  CO2 24  --  24 24 24 26 24   GLUCOSE 606*  --  362* 156* 283* 202* 169*  BUN 54*  --  50* 41* 23* 14 15  CREATININE 1.69*  --  1.57* 1.28* 1.11 1.09 1.14  CALCIUM 10.1  --  10.8* 10.0 9.3 9.6 9.5  MG  --  2.7* 2.7*  --  1.8  --   --   PHOS 2.8  --  3.5  --   --   --   --    Liver Function Tests: Recent Labs  Lab 11/10/18 1820 11/10/18 2343 11/12/18 0322  AST 16  --  19  ALT 15  --  11  ALKPHOS 178*  --  75  BILITOT 0.2*  --  0.5  PROT 8.6*  --  7.1  ALBUMIN 3.2* 3.0* 2.4*    Cardiac Enzymes: No results for input(s): CKTOTAL, CKMB, CKMBINDEX, TROPONINI in the last 168 hours.  CBG: Recent Labs  Lab 11/13/18 1116 11/13/18 1642 11/13/18 2137 11/14/18 0755 11/14/18 1124  GLUCAP 252* 268* 253* 166* 154*    Recent Results (from the past 240 hour(s))  SARS Coronavirus 2 [facility](Hospital order, Performed in [facility] hospital lab) Nasopharyngeal  Nasopharyngeal Swab     Status: None   Collection Time: 11/10/18 10:19 PM   Specimen: Nasopharyngeal Swab  Result Value Ref Range Status   SARS Coronavirus 2 NEGATIVE NEGATIVE Final    Comment: (NOTE) If result is NEGATIVE SARS-CoV-2 target nucleic acids are NOT DETECTED. The SARS-CoV-2 RNA is generally detectable in upper and lower  respiratory specimens during the acute phase of infection. The lowest  concentration of SARS-CoV-2 viral copies this assay can detect is 250  copies / mL. A negative result does not preclude SARS-CoV-2 infection  and should not be used as the sole basis for treatment or other  patient management decisions.  A negative result may occur with  improper specimen collection / handling, submission of specimen other  than nasopharyngeal swab, presence of viral mutation(s) within the  areas targeted by this assay, and inadequate number of viral copies  (<250 copies / mL). A negative result must be combined with clinical  observations, patient history, and epidemiological information. If result is POSITIVE SARS-CoV-2 target nucleic acids are DETECTED. The SARS-CoV-2 RNA is generally detectable in upper and lower  respiratory specimens dur ing the acute phase of infection.  Positive  results are indicative of active infection with SARS-CoV-2.  Clinical  correlation with patient history and other diagnostic information is  necessary to determine patient infection status.  Positive results do  not rule out bacterial infection or co-infection with other viruses. If result is PRESUMPTIVE POSTIVE SARS-CoV-2 nucleic acids MAY BE PRESENT.   A presumptive positive result was obtained on the submitted specimen  and confirmed on repeat testing.  While 2019 novel coronavirus  (SARS-CoV-2) nucleic acids may be present in the submitted sample  additional confirmatory testing may be necessary for epidemiological  and / or clinical management purposes  to differentiate between    SARS-CoV-2 and other Sarbecovirus currently known to infect humans.  If clinically indicated additional testing with an alternate test  methodology [id number]) is advised. The SARS-CoV-2 RNA is generally  detectable in upper and lower respiratory sp ecimens during the acute  phase of infection. The expected result is Negative. Fact Sheet for Patients:  BoilerBrush.com.cyhttps://www.fda.gov/media/136312/download Fact Sheet for Healthcare Providers: https://pope.com/https://www.fda.gov/media/136313/download This test is not yet approved or cleared  by the [place] and has been authorized for detection and/or diagnosis of SARS-CoV-2 by FDA under an Emergency Use Authorization (EUA).  This EUA will remain in effect (meaning this test can be used) for the duration of the COVID-19 declaration under Section 564(b)(1) of the Act, 21 U.S.C. section 360bbb-3(b)(1), unless the authorization is terminated or revoked sooner. Performed at [facility], [address], [place], [place] [address]          Radiology Studies: No results found.      Scheduled Meds:  Darunavir-Cobicisctat-Emtricitabine-Tenofovir Alafenamide  1 tablet Oral Q breakfast   dolutegravir  50 mg Oral Daily   insulin aspart  0-9 Units Subcutaneous TID WC   insulin aspart protamine- aspart  18 Units Subcutaneous Q breakfast   insulin aspart protamine- aspart  18 Units Subcutaneous Q supper   pantoprazole  40 mg Oral Daily   polyethylene glycol  17 g Oral BID   senna  2 tablet Oral Daily   Continuous Infusions:  ferumoxytol Stopped (11/11/18 1731)     LOS: 4 days     Marcellus Scott, MD, FACP, [facility]. [facility]  To contact the attending provider between 7A-7P or the covering provider during after hours 7P-7A, please log into the web site [email or web address] and access using universal [place] password for that web site. If you do not have the password, please call the hospital operator.  11/14/2018, 1:39 PM

## 2018-11-14 NOTE — Anesthesia Preprocedure Evaluation (Addendum)
Anesthesia Evaluation  Patient identified by MRN, date of birth, ID band Patient awake    Reviewed: Allergy & Precautions, H&P , NPO status , Patient's Chart, lab work & pertinent test results  Airway Mallampati: I  TM Distance: >3 FB Neck ROM: Full    Dental no notable dental hx. (+) Teeth Intact, Dental Advisory Given   Pulmonary neg pulmonary ROS, former smoker,    Pulmonary exam normal breath sounds clear to auscultation       Cardiovascular negative cardio ROS   Rhythm:Regular Rate:Normal     Neuro/Psych Depression negative neurological ROS     GI/Hepatic negative GI ROS, (+) Hepatitis -  Endo/Other  negative endocrine ROS  Renal/GU negative Renal ROS  negative genitourinary   Musculoskeletal   Abdominal   Peds  Hematology  (+) Blood dyscrasia, anemia , HIV,   Anesthesia Other Findings   Reproductive/Obstetrics negative OB ROS                            Anesthesia Physical Anesthesia Plan  ASA: II  Anesthesia Plan: MAC   Post-op Pain Management:    Induction: Intravenous  PONV Risk Score and Plan: 1 and Propofol infusion  Airway Management Planned: Simple Face Mask  Additional Equipment:   Intra-op Plan:   Post-operative Plan:   Informed Consent: I have reviewed the patients History and Physical, chart, labs and discussed the procedure including the risks, benefits and alternatives for the proposed anesthesia with the patient or authorized representative who has indicated his/her understanding and acceptance.     Dental advisory given  Plan Discussed with: CRNA  Anesthesia Plan Comments:         Anesthesia Quick Evaluation

## 2018-11-14 NOTE — Telephone Encounter (Addendum)
Request to  reschedule missed appointment .  Patient is currently in the hospital . Dr Megan Salon has requested we coordinate serverices for surgery.

## 2018-11-14 NOTE — Progress Notes (Signed)
Patient did teach back demonstration of how to self administer insulin.  Needs continuing education and reinforcement; should practice blood glucose monitoring.  Elaina Hoops, RN

## 2018-11-14 NOTE — Progress Notes (Signed)
Patient teach back demonstration on how to check sugar levels using the glucometer.  [name] was able to demonstrate twice and was very proficient with his technique, as I demonstrated each step.   He will need additional teach back demonstration on administering his insulin per daytime shift nurse.  I will pass this information tomorrow on shift report.

## 2018-11-14 NOTE — Telephone Encounter (Signed)
5: 21 pm    Returned called to Dr Morton Stall office regarding rescheduling missed appointment .  There referral coordinator will need to call our office since he is no longer insured.   Office was informed of language barrier and need for translator for [place] and [place].   Once appointment is given our office will need to speak with patient's son to ensure this appointment is kept.   Dr Donnella Bi office will need to call us back on tomorrow 11-15-2018/   IT IS VERY IMPORTANT THAT THE PATIENT IS AWARE OF ANY APPOINTMENT SCHEDULED AT [facility].  MISSED APPOINTMENTS MAY RESULT IN THE INABILITY TO RESCHEDULE DUE TO MULTIPLE MISSED VISITS.  HFU visit with Dr Baxter Flattery on 11-22-2018.

## 2018-11-14 NOTE — Telephone Encounter (Signed)
Office states provider will need to speak with Dr Megan Salon.   They will await instructions from Dr Morton Stall.  I was asked to call later for information.

## 2018-11-14 NOTE — Anesthesia Postprocedure Evaluation (Signed)
Anesthesia Post Note  Patient: [name]  Procedure(s) Performed: ABORTED CASE     Patient location during evaluation: Endoscopy Anesthesia Type: MAC Level of consciousness: awake and alert Pain management: pain level controlled Vital Signs Assessment: post-procedure vital signs reviewed and stable Respiratory status: spontaneous breathing, nonlabored ventilation and respiratory function stable Cardiovascular status: stable and blood pressure returned to baseline Postop Assessment: no apparent nausea or vomiting Anesthetic complications: no    Last Vitals:  Vitals:   11/14/18 1435 11/14/18 1438  BP: 99/64 105/63  Pulse: (!) 101 (!) 106  Resp: (!) 25 (!) 22  Temp:    SpO2: 94% 96%    Last Pain:  Vitals:   11/14/18 1438  TempSrc:   PainSc: 9                  Layn Kye,W. EDMOND

## 2018-11-14 NOTE — Op Note (Signed)
[facility] Patient Name: Anthony RunnerJean-paul Parsons Procedure Date : 11/14/2018 MRN: [id number] Attending MD: Tressia DanasKimberly Jarnell Cordaro MD, MD Date of Birth: 02/22/1971 CSN: [id number] Age: 48 Admit Type: Inpatient Procedure:                Colonoscopy Indications:              Anal bleeding Providers:                Tressia DanasKimberly Jozsef Wescoat MD, MD, Clearnce Sorrelarrie Baker, RN, Brion AlimentShayla                            Proctor, Technician Referring MD:              Medicines:                See the Anesthesia note for documentation of the                            administered medications Complications:            No immediate complications. Estimated Blood Loss:     Estimated blood loss: none. Procedure:                Pre-Anesthesia Assessment:                           - Prior to the procedure, a History and Physical                            was performed, and patient medications and                            allergies were reviewed. The patient's tolerance of                            previous anesthesia was also reviewed. The risks                            and benefits of the procedure and the sedation                            options and risks were discussed with the patient.                            All questions were answered, and informed consent                            was obtained. Prior Anticoagulants: The patient has                            taken no previous anticoagulant or antiplatelet                            agents. ASA Grade Assessment: II - A patient with                            mild  systemic disease. After reviewing the risks                            and benefits, the patient was deemed in                            satisfactory condition to undergo the procedure.                           After obtaining informed consent, the colonoscope                            was passed under direct vision. Throughout the                            procedure, the patient's blood  pressure, pulse, and                            oxygen saturations were monitored continuously. The                            procedure was aborted. The colonscope was not                            inserted. Medications were given. The colonoscopy                            was technically difficult and complex due to                            abnormal anatomy. The patient tolerated the                            procedure well. The quality of the bowel                            preparation was poor. No anatomical landmarks were                            photographed. Scope In: Scope Out: Findings:      The perianal exam findings include extensive, bulky, friable anal       condyloma with extension to the scrotum and the base of the penis. There       was blood visible throughout the condyloma particular adjacent to the       anus. Liquid brown, chunky stool was protruding through the the anal       canal during my entire evaluation. A full colonoscopy was cancelled due       to the identified findings and the poor preparation. Impression:               - Preparation of the colon was poor.                           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 This is friable                            and a source of bleeding.                           - No endoscopy performed. No specimens collected. Recommendation:           - Patient has a contact number available for                            emergencies. The signs and symptoms of potential                            delayed complications were discussed with the                            patient. Return to normal activities tomorrow.                            Written discharge instructions were provided to the                            patient.                           - Resume previous diet.                           - Continue present medications.                            - General Surgery, Urology, Plastic Surgery, and ID                            consultations will be necessary to successfully                            treat his condyloma.                           - Unfortunately, I have little that I can offer at                            this time for treatment or symptomatic relief.                           - Recomend [facility] for treatment.                           - The results were reviewed with the patient with                            the assistance of the Kiswahili interpreter. Procedure Code(s):         Diagnosis Code(s):        ---  Professional ---                           A63.0, Anogenital (venereal) warts                           K62.5, Hemorrhage of anus and rectum CPT copyright 2019 American Medical Association. All rights reserved. The codes documented in this report are preliminary and upon coder review may  be revised to meet current compliance requirements. Tressia Danas MD, MD 11/14/2018 3:06:16 PM This report has been signed electronically. Number of Addenda: 0

## 2018-11-15 ENCOUNTER — Telehealth: Payer: Self-pay

## 2018-11-15 DIAGNOSIS — E1110 Type 2 diabetes mellitus with ketoacidosis without coma: Principal | ICD-10-CM

## 2018-11-15 LAB — GLUCOSE, CAPILLARY: Glucose-Capillary: 240 mg/dL — ABNORMAL HIGH (ref 70–99)

## 2018-11-15 LAB — BASIC METABOLIC PANEL
Anion gap: 7 (ref 5–15)
BUN: 14 mg/dL (ref 6–20)
CO2: 26 mmol/L (ref 22–32)
Calcium: 9.6 mg/dL (ref 8.9–10.3)
Chloride: 102 mmol/L (ref 98–111)
Creatinine, Ser: 1.1 mg/dL (ref 0.61–1.24)
GFR calc Af Amer: >60 mL/min (ref >60)
GFR calc non Af Amer: >60 mL/min (ref >60)
Glucose, Bld: 214 mg/dL — ABNORMAL HIGH (ref 70–99)
Potassium: 4.3 mmol/L (ref 3.5–5.1)
Sodium: 135 mmol/L (ref 135–145)

## 2018-11-15 LAB — CBC
HCT: 26.2 % — ABNORMAL LOW (ref 39.0–52.0)
Hemoglobin: 7.8 g/dL — ABNORMAL LOW (ref 13.0–17.0)
MCH: 23.2 pg — ABNORMAL LOW (ref 26.0–34.0)
MCHC: 29.8 g/dL — ABNORMAL LOW (ref 30.0–36.0)
MCV: 78 fL — ABNORMAL LOW (ref 80.0–100.0)
Platelets: 241 10*3/uL (ref 150–400)
RBC: 3.36 MIL/uL — ABNORMAL LOW (ref 4.22–5.81)
RDW: 17 % — ABNORMAL HIGH (ref 11.5–15.5)
WBC: 7.9 10*3/uL (ref 4.0–10.5)
nRBC: 0 % (ref 0.0–0.2)

## 2018-11-15 LAB — MAGNESIUM: Magnesium: 1.8 mg/dL (ref 1.7–2.4)

## 2018-11-15 MED ORDER — JANUMET 50-500 MG PO TABS: 1.0000 | ORAL_TABLET | Freq: Two times a day (BID) | ORAL | 0 refills | Status: DC

## 2018-11-15 MED ORDER — GLIPIZIDE 10 MG PO TABS: 10.0000 mg | ORAL_TABLET | Freq: Every day | ORAL | 0 refills | Status: DC

## 2018-11-15 NOTE — Progress Notes (Signed)
RN found a bag with glucometer and strips at charge nurse's station, confirmed with charge Architect that it's for this patient.  RN went over home medication with patient and his son. TOC meds delievered. Pt DC'ed via wheelchair

## 2018-11-15 NOTE — Discharge Summary (Signed)
Physician Discharge Summary  JEFERSON Parsons WCB:[id number] DOB: 1970-07-31 DOA: 11/10/2018  PCP: Dickie La, MD  Admit date: 11/10/2018 Discharge date: 11/15/2018  Admitted From: Home Disposition: Home  Recommendations for Outpatient Follow-up:  1. Follow up with PCP in 1-2 weeks 2. Follow-up with Dr. Cheryll Cockayne at [facility] on 11/17/2018 3. Please obtain BMP/CBC in one week 4. Please follow up on the following pending results:  Home Health: None Equipment/Devices: None  Discharge Condition: Stable CODE STATUS: Full code Diet recommendation: Consistent carbohydrate  Subjective: Patient seen and examined.  He has no complaint.  Brief/Interim Summary: 48 year old [name] speaking male with PMH of HIV under reasonable control, bulky genital warts awaiting extensive surgery at [facility] delayed due to [place] pandemic, TB, hepatitis, depression presented to [facility] ED on 11/10/2018 due to 2-day history of nausea, vomiting, abdominal pain, constipation and weakness.  In ED, he had blood glucose of 1196, sodium 119, potassium 6.3, bicarbonate 19, BUN 56, creatinine 1.93, anion gap 17.  He was admitted for newly diagnosed DM with DKA, dehydration, acute renal failure.  CT abdomen without acute findings.  Briefly on IV insulin drip, transitioned to Lantus and SSI after DKA resolved.  ID consulted for HIV and bulky genital warts but this will be deferred to outpatient follow-up.  Rectal bleeding noted 8/29, Hb down to 6.7, transfused 1 unit PRBC, [facility] consulted and plan colonoscopy was attempted however this was unsuccessful due to having large and bulky as well as friable genital warts.  GI recommended that he should be transferred to [facility] for multispecialty/team approach for surgery and colonoscopy.  My colleague hospitalist Dr. Algis Liming had discussed with Dr. Cheryll Cockayne, general surgeon at [facility] yesterday however the general surgeon did not feel that the  patient was appropriate for inpatient transfer and declined acceptance but he was happy to see patient in the clinic on Thursday, 11/17/2018 in the office as a scheduled appointment.  Patient had very mild DKA with hemoglobin A1c of 9.3.  Although he was started on 70/30 along with SSI here however with hemoglobin A1c of 9.3, I believe that we can treat him with oral antidiabetic medications and avoid insulin although patient was also educated on how to inject insulin to himself.  At this point in time, I am going to discharge patient on glipizide 10 mg p.o. daily along with Janumet 50/500 twice daily.  He will follow with PCP as well.  Discharge Diagnoses:  Principal Problem:   DKA (diabetic ketoacidoses) ([place]) Active Problems:   HIV (human immunodeficiency virus infection) ([place])   AKI (acute kidney injury) ([place])   Condyloma    Discharge Instructions  Discharge Instructions    Discharge patient   Complete by: As directed    Discharge disposition: 01-Home or Self Care   Discharge patient date: 11/15/2018     Allergies as of 11/15/2018   No Known Allergies     Medication List    TAKE these medications   Darunavir-Cobicisctat-Emtricitabine-Tenofovir Alafenamide 800-150-200-10 MG Tabs Commonly known as: Symtuza Take 1 tablet by mouth daily with breakfast.   dolutegravir 50 MG tablet Commonly known as: Tivicay Take 1 tablet (50 mg total) by mouth daily.   glipiZIDE 10 MG tablet Commonly known as: GLUCOTROL Take 1 tablet (10 mg total) by mouth daily before breakfast.   Janumet 50-500 MG tablet Generic drug: sitaGLIPtin-metformin Take 1 tablet by mouth 2 (two) times daily with a meal.   lidocaine 5 %  ointment Commonly known as: XYLOCAINE Apply 1 application topically 3 (three) times daily as needed. Apply over genital area for pain relief. What changed:   reasons to take this  additional instructions      Follow-up Information    Anthony Hurt, MD Follow up on 11/17/2018.    Specialty: Surgical Oncology Why: MDs office will also call patient son with the appointment time. Contact information: [address] [facility] [place] [address] [phone]        [name], MD Follow up in 1 week(s).   Specialties: Family Medicine, Sports Medicine Contact information: [address] [place] [address] [phone]          No Known Allergies  Consultations: ID/general surgery/GI   Procedures/Studies: Ct Abdomen Pelvis Wo Contrast  Result Date: 11/10/2018 CLINICAL DATA:  48 year old male with left-sided back pain. Acute renal failure. Concern for kidney stone. EXAM: CT ABDOMEN AND PELVIS WITHOUT CONTRAST TECHNIQUE: Multidetector CT imaging of the abdomen and pelvis was performed following the standard protocol without IV contrast. COMPARISON:  None. FINDINGS: Evaluation of this exam is limited in the absence of intravenous contrast. Evaluation is also limited due to respiratory motion artifact. Lower chest: Moderate bronchiectatic changes in the left lower lobe. There is hypoattenuation of the cardiac blood pool suggestive of a degree of anemia. Clinical correlation is recommended. No intra-abdominal free air or free fluid Hepatobiliary: No focal liver abnormality is seen. No gallstones, gallbladder wall thickening, or biliary dilatation. Pancreas: Unremarkable. No pancreatic ductal dilatation or surrounding inflammatory changes. Spleen: Normal in size without focal abnormality. Adrenals/Urinary Tract: Adrenal glands are unremarkable. Kidneys are normal, without renal calculi, focal lesion, or hydronephrosis. Bladder is unremarkable. Stomach/Bowel: There is moderate stool throughout the colon. There is no bowel obstruction or active inflammation. Normal appendix. Vascular/Lymphatic: The abdominal aorta and IVC are unremarkable. Minimal atherosclerotic calcification of the iliac arteries. No portal venous gas. There is no  adenopathy. Reproductive: The prostate and seminal vesicles are grossly unremarkable. No pelvic mass. Other: None Musculoskeletal: No acute or significant osseous findings. IMPRESSION: 1. No acute intra-abdominal or pelvic pathology. No hydronephrosis or nephrolithiasis. 2. Moderate colonic stool burden. No bowel obstruction or active inflammation. Normal appendix. Electronically Signed   By: Elgie Collard M.D.   On: 11/10/2018 22:30   Dg Chest Portable 1 View  Result Date: 11/10/2018 CLINICAL DATA:  Cough and shortness of breath EXAM: PORTABLE CHEST 1 VIEW COMPARISON:  11/21/2017 and prior radiographs FINDINGS: The cardiomediastinal silhouette is unremarkable. Chronic pulmonary changes/scarring within the LOWER lungs, LEFT-greater-than-RIGHT, again noted. There is no evidence of focal airspace disease, pulmonary edema, suspicious pulmonary nodule/mass, pleural effusion, or pneumothorax. No acute bony abnormalities are identified. IMPRESSION: No evidence of acute cardiopulmonary disease. Electronically Signed   By: Harmon Pier M.D.   On: 11/10/2018 21:19     Discharge Exam: Vitals:   11/14/18 2149 11/15/18 0439  BP: 124/68 122/81  Pulse: 99 (!) 106  Resp: 20 (!) 23  Temp: 98.7 F (37.1 C) 98.7 F (37.1 C)  SpO2: 95% 98%   Vitals:   11/14/18 1505 11/14/18 2149 11/15/18 0104 11/15/18 0439  BP: 116/73 124/68  122/81  Pulse: (!) 102 99  (!) 106  Resp: 16 20  (!) 23  Temp:  98.7 F (37.1 C)  98.7 F (37.1 C)  TempSrc:  Oral  Oral  SpO2: 96% 95%  98%  Weight:   78.4 kg     General: Pt is alert,  awake, not in acute distress Cardiovascular: RRR, S1/S2 +, no rubs, no gallops Respiratory: CTA bilaterally, no wheezing, no rhonchi Abdominal: Soft, NT, ND, bowel sounds + Extremities: no edema, no cyanosis    The results of significant diagnostics from this hospitalization (including imaging, microbiology, ancillary and laboratory) are listed below for reference.      Microbiology: Recent Results (from the past 240 hour(s))  SARS Coronavirus 2 [facility](Hospital order, Performed in [facility] hospital lab) Nasopharyngeal Nasopharyngeal Swab     Status: None   Collection Time: 11/10/18 10:19 PM   Specimen: Nasopharyngeal Swab  Result Value Ref Range Status   SARS Coronavirus 2 NEGATIVE NEGATIVE Final    Comment: (NOTE) If result is NEGATIVE SARS-CoV-2 target nucleic acids are NOT DETECTED. The SARS-CoV-2 RNA is generally detectable in upper and lower  respiratory specimens during the acute phase of infection. The lowest  concentration of SARS-CoV-2 viral copies this assay can detect is 250  copies / mL. A negative result does not preclude SARS-CoV-2 infection  and should not be used as the sole basis for treatment or other  patient management decisions.  A negative result may occur with  improper specimen collection / handling, submission of specimen other  than nasopharyngeal swab, presence of viral mutation(s) within the  areas targeted by this assay, and inadequate number of viral copies  (<250 copies / mL). A negative result must be combined with clinical  observations, patient history, and epidemiological information. If result is POSITIVE SARS-CoV-2 target nucleic acids are DETECTED. The SARS-CoV-2 RNA is generally detectable in upper and lower  respiratory specimens dur ing the acute phase of infection.  Positive  results are indicative of active infection with SARS-CoV-2.  Clinical  correlation with patient history and other diagnostic information is  necessary to determine patient infection status.  Positive results do  not rule out bacterial infection or co-infection with other viruses. If result is PRESUMPTIVE POSTIVE SARS-CoV-2 nucleic acids MAY BE PRESENT.   A presumptive positive result was obtained on the submitted specimen  and confirmed on repeat testing.  While 2019 novel coronavirus  (SARS-CoV-2) nucleic acids may be present in  the submitted sample  additional confirmatory testing may be necessary for epidemiological  and / or clinical management purposes  to differentiate between  SARS-CoV-2 and other Sarbecovirus currently known to infect humans.  If clinically indicated additional testing with an alternate test  methodology [phone]([id number]) is advised. The SARS-CoV-2 RNA is generally  detectable in upper and lower respiratory sp ecimens during the acute  phase of infection. The expected result is Negative. Fact Sheet for Patients:  BoilerBrush.com.cyhttps://www.fda.gov/media/136312/download Fact Sheet for Healthcare Providers: https://pope.com/https://www.fda.gov/media/136313/download This test is not yet approved or cleared by the [place] FDA and has been authorized for detection and/or diagnosis of SARS-CoV-2 by FDA under an Emergency Use Authorization (EUA).  This EUA will remain in effect (meaning this test can be used) for the duration of the COVID-19 declaration under Section 564(b)(1) of the Act, 21 U.S.C. section 360bbb-3(b)(1), unless the authorization is terminated or revoked sooner. Performed at [facility], [address] [place], [place] [address]      Labs: BNP (last 3 results) No results for input(s): BNP in the last 8760 hours. Basic Metabolic Panel: Recent Labs  Lab 11/10/18 2343 11/10/18 2344 11/11/18 0151 11/11/18 [id number] 11/12/18 0322 11/13/18 0432 11/14/18 0444 11/15/18 0429  NA 138  --  139 138 135 135 135 135  K 4.1  --  4.0  3.4* 4.1 3.8 3.3* 4.3  CL 104  --  103 105 104 100 102 102  CO2 24  --  24 24 24 26 24 26   GLUCOSE 606*  --  362* 156* 283* 202* 169* 214*  BUN 54*  --  50* 41* 23* 14 15 14   CREATININE 1.69*  --  1.57* 1.28* 1.11 1.09 1.14 1.10  CALCIUM 10.1  --  10.8* 10.0 9.3 9.6 9.5 9.6  MG  --  2.7* 2.7*  --  1.8  --   --  1.8  PHOS 2.8  --  3.5  --   --   --   --   --    Liver Function Tests: Recent Labs  Lab 11/10/18 1820 11/10/18 2343 11/12/18 0322  AST 16  --  19  ALT 15  --   11  ALKPHOS 178*  --  75  BILITOT 0.2*  --  0.5  PROT 8.6*  --  7.1  ALBUMIN 3.2* 3.0* 2.4*   Recent Labs  Lab 11/10/18 1820  LIPASE 91*   No results for input(s): AMMONIA in the last 168 hours. CBC: Recent Labs  Lab 11/10/18 2344  11/12/18 0322 11/12/18 1630 11/12/18 2152 11/13/18 0432 11/14/18 0444 11/15/18 0429  WBC 9.0  --  6.5  --  7.3 7.8 12.5* 7.9  NEUTROABS 7.2  --   --   --   --   --   --   --   HGB 7.5*  --  6.7* 7.7* 7.8* 8.3* 8.1* 7.8*  HCT 25.1*   < > 22.9* 26.0* 25.5* 27.5* 27.0* 26.2*  MCV 72.5*  --  73.6*  --  74.6* 75.3* 76.1* 78.0*  PLT 303  --  252  --  252 250 247 241   < > = values in this interval not displayed.   Cardiac Enzymes: No results for input(s): CKTOTAL, CKMB, CKMBINDEX, TROPONINI in the last 168 hours. BNP: Invalid input(s): POCBNP CBG: Recent Labs  Lab 11/14/18 0755 11/14/18 1124 11/14/18 1639 11/14/18 2103 11/15/18 0846  GLUCAP 166* 154* 251* 207* 240*   D-Dimer No results for input(s): DDIMER in the last 72 hours. Hgb A1c No results for input(s): HGBA1C in the last 72 hours. Lipid Profile No results for input(s): CHOL, HDL, LDLCALC, TRIG, CHOLHDL, LDLDIRECT in the last 72 hours. Thyroid function studies No results for input(s): TSH, T4TOTAL, T3FREE, THYROIDAB in the last 72 hours.  Invalid input(s): FREET3 Anemia work up No results for input(s): VITAMINB12, FOLATE, FERRITIN, TIBC, IRON, RETICCTPCT in the last 72 hours. Urinalysis    Component Value Date/Time   COLORURINE STRAW (A) 11/10/2018 2139   APPEARANCEUR CLEAR 11/10/2018 2139   LABSPEC 1.024 11/10/2018 2139   PHURINE 6.0 11/10/2018 2139   GLUCOSEU >=500 (A) 11/10/2018 2139   HGBUR SMALL (A) 11/10/2018 2139   BILIRUBINUR NEGATIVE 11/10/2018 2139   KETONESUR NEGATIVE 11/10/2018 2139   PROTEINUR NEGATIVE 11/10/2018 2139   UROBILINOGEN 1.0 03/23/2014 1400   NITRITE NEGATIVE 11/10/2018 2139   LEUKOCYTESUR NEGATIVE 11/10/2018 2139   Sepsis Labs Invalid  input(s): PROCALCITONIN,  WBC,  LACTICIDVEN Microbiology Recent Results (from the past 240 hour(s))  SARS Coronavirus 2 [facility] order, Performed in [facility] hospital lab) Nasopharyngeal Nasopharyngeal Swab     Status: None   Collection Time: 11/10/18 10:19 PM   Specimen: Nasopharyngeal Swab  Result Value Ref Range Status   SARS Coronavirus 2 NEGATIVE NEGATIVE Final    Comment: (NOTE) If result is NEGATIVE SARS-CoV-2  target nucleic acids are NOT DETECTED. The SARS-CoV-2 RNA is generally detectable in upper and lower  respiratory specimens during the acute phase of infection. The lowest  concentration of SARS-CoV-2 viral copies this assay can detect is 250  copies / mL. A negative result does not preclude SARS-CoV-2 infection  and should not be used as the sole basis for treatment or other  patient management decisions.  A negative result may occur with  improper specimen collection / handling, submission of specimen other  than nasopharyngeal swab, presence of viral mutation(s) within the  areas targeted by this assay, and inadequate number of viral copies  (<250 copies / mL). A negative result must be combined with clinical  observations, patient history, and epidemiological information. If result is POSITIVE SARS-CoV-2 target nucleic acids are DETECTED. The SARS-CoV-2 RNA is generally detectable in upper and lower  respiratory specimens dur ing the acute phase of infection.  Positive  results are indicative of active infection with SARS-CoV-2.  Clinical  correlation with patient history and other diagnostic information is  necessary to determine patient infection status.  Positive results do  not rule out bacterial infection or co-infection with other viruses. If result is PRESUMPTIVE POSTIVE SARS-CoV-2 nucleic acids MAY BE PRESENT.   A presumptive positive result was obtained on the submitted specimen  and confirmed on repeat testing.  While 2019 novel coronavirus   (SARS-CoV-2) nucleic acids may be present in the submitted sample  additional confirmatory testing may be necessary for epidemiological  and / or clinical management purposes  to differentiate between  SARS-CoV-2 and other Sarbecovirus currently known to infect humans.  If clinically indicated additional testing with an alternate test  methodology [phone]([id number]) is advised. The SARS-CoV-2 RNA is generally  detectable in upper and lower respiratory sp ecimens during the acute  phase of infection. The expected result is Negative. Fact Sheet for Patients:  BoilerBrush.com.cyhttps://www.fda.gov/media/136312/download Fact Sheet for Healthcare Providers: https://pope.com/https://www.fda.gov/media/136313/download This test is not yet approved or cleared by the [place] FDA and has been authorized for detection and/or diagnosis of SARS-CoV-2 by FDA under an Emergency Use Authorization (EUA).  This EUA will remain in effect (meaning this test can be used) for the duration of the COVID-19 declaration under Section 564(b)(1) of the Act, 21 U.S.C. section 360bbb-3(b)(1), unless the authorization is terminated or revoked sooner. Performed at [facility], [address] [place], [place] [address]      Time coordinating discharge: Over 30 minutes  SIGNED:   Hughie Clossavi Shavonne Ambroise, MD  [facility] 11/15/2018, 9:38 AM Pager [phone]  If 7PM-7AM, please contact night-coverage [email or web address] Password [email or web address]

## 2018-11-15 NOTE — Telephone Encounter (Signed)
Request from Jacqlyn Krauss, RN CM for hospital follow up appointment for patient at [facility].  Informed her that an appointment has been scheduled for 11/23/2018 @ 1050.

## 2018-11-15 NOTE — TOC Initial Note (Signed)
Transition of Care [facility]) - Initial/Assessment Note    Patient Details  Name: [name] MRN: [id number] Date of Birth: 1970/12/15  [facility] CM/SW Contact:    Bethena Roys, RN Phone Number: 11/15/2018, 11:19 AM  Clinical Narrative:   Pt presented for DKA- hx of 042. Pt is seen at the Infectious Disease Clinic. CM did set patient up with the outpatient PCP at the [facility]. Appointment placed on the AVS. CM did call Opal Sidles with [facility] and he will be a patient with them for close follow up. Patient will be able to use the [place] outpatient. [facility] to [facility] completed and medications will be delivered to bedside before transition home. No further needs from CM at this time.                Expected Discharge Plan: Home/Self Care Barriers to Discharge: No Barriers Identified   Patient Goals and CMS Choice     Choice offered to / list presented to : NA  Expected Discharge Plan and Services Expected Discharge Plan: Home/Self Care In-house Referral: NA Discharge Planning Services: CM Consult, [place] [facility], Follow-up appt scheduled, [facility], Medication Assistance Post Acute Care Choice: NA   Expected Discharge Date: 11/15/18                         [facility] Arranged: NA          Prior Living Arrangements/Services   Lives with:: Self Patient language and need for interpreter reviewed:: Yes Do you feel safe going back to the place where you live?: Yes      Need for Family Participation in Patient Care: Yes (Comment) Care giver support system in place?: Yes (comment)   Criminal Activity/Legal Involvement Pertinent to Current Situation/Hospitalization: No - Comment as needed  Activities of Daily Living Home Assistive Devices/Equipment: None ADL Screening (condition at time of admission) Patient's cognitive ability adequate to safely complete  daily activities?: Yes Is the patient deaf or have difficulty hearing?: No Does the patient have difficulty seeing, even when wearing glasses/contacts?: No Does the patient have difficulty concentrating, remembering, or making decisions?: No Patient able to express need for assistance with ADLs?: Yes Does the patient have difficulty dressing or bathing?: No Independently performs ADLs?: Yes (appropriate for developmental age) Does the patient have difficulty walking or climbing stairs?: Yes Weakness of Legs: None Weakness of Arms/Hands: None  Permission Sought/Granted                  Emotional Assessment Appearance:: Appears stated age Attitude/Demeanor/Rapport: Unable to Assess Affect (typically observed): Unable to Assess Orientation: : Oriented to Self, Oriented to Place, Oriented to Situation, Oriented to  Time Alcohol / Substance Use: Not Applicable Psych Involvement: No (comment)  Admission diagnosis:  Hyperkalemia [E87.5] Hyperglycemia [R73.9] Diabetic ketoacidosis without coma associated with type 2 diabetes mellitus ([place]) [E11.10] Patient Active Problem List   Diagnosis Date Noted  . DKA (diabetic ketoacidoses) ([place]) 11/10/2018  . Pulmonary emphysema ([place])   . 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 Chronic systolic CHF (congestive heart failure) ([place]) 04/24/2016  . AKI (acute kidney injury) ([place]) 04/24/2016  . 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 Normochromic normocytic anemia 02/25/2016  . Protein-calorie malnutrition, severe 02/25/2016  . Renal insufficiency   . S/P ORIF (open  reduction internal fixation) fracture 03/21/2014  . HIV (human immunodeficiency virus infection) ([name]) 09/18/2013  . Refugee health examination 09/18/2013   PCP:  Nestor RampNeal, Sara L, MD Pharmacy:   [facility][id number] - [place] - [address] AT [facility] [address] [place] [address] Phone: [phone] Fax: [phone]  [facility] - [place], [place] - [address] [place] [address] Phone: [phone] Fax: [phone]  [facility] - [place], [place] - [address]  [place] [address] Phone: [phone] Fax: [phone]  [id number] - [place] - [place] AT [facility] [address] [facility] [address] [place] [address] Phone: [phone] Fax: [phone]  [facility] [place], [place] - [address] [place] [address] Phone: [phone] Fax: [phone]     Social Determinants of Health (SDOH) Interventions    Readmission Risk Interventions No flowsheet data found.

## 2018-11-15 NOTE — Telephone Encounter (Signed)
Spoke with scheduler at Dr Morton Stall office and she advised she received a call from the doctor and he wants to see the patient this week so they are going to see him 11/18/18 at 10 am. They did ask if we could do his [employer] app and they will help him with the financial aid app when he is there Thursday. Patients son is aware of appointment and agreed to get him there. Will work with staff to figure out how to get his paperwork started.

## 2018-11-15 NOTE — Discharge Instructions (Signed)
Blood Glucose Monitoring, Adult Monitoring your blood sugar (glucose) is an important part of managing your diabetes (diabetes mellitus). Blood glucose monitoring involves checking your blood glucose as often as directed and keeping a record (log) of your results over time. Checking your blood glucose regularly and keeping a blood glucose log can:  Help you and your health care provider adjust your diabetes management plan as needed, including your medicines or insulin.  Help you understand how food, exercise, illnesses, and medicines affect your blood glucose.  Let you know what your blood glucose is at any time. You can quickly find out if you have low blood glucose (hypoglycemia) or high blood glucose (hyperglycemia). Your health care provider will set individualized treatment goals for you. Your goals will be based on your age, other medical conditions you have, and how you respond to diabetes treatment. Generally, the goal of treatment is to maintain the following blood glucose levels:  Before meals (preprandial): 80-130 mg/dL (4.4-7.2 mmol/L).  After meals (postprandial): below 180 mg/dL (10 mmol/L).  A1c level: less than 7%. Supplies needed:  Blood glucose meter.  Test strips for your meter. Each meter has its own strips. You must use the strips that came with your meter.  A needle to prick your finger (lancet). [name] not use a lancet more than one time.  A device that holds the lancet (lancing device).  A journal or log book to write down your results. How to check your blood glucose  1. Wash your hands with soap and water. 2. Prick the side of your finger (not the tip) with the lancet. Use a different finger each time. 3. Gently rub the finger until a small drop of blood appears. 4. Follow instructions that come with your meter for inserting the test strip, applying blood to the strip, and using your blood glucose meter. 5. Write down your result and any notes. Some meters  allow you to use areas of your body other than your finger (alternative sites) to test your blood. The most common alternative sites are:  Forearm.  Thigh.  Palm of the hand. If you think you may have hypoglycemia, or if you have a history of not knowing when your blood glucose is getting low (hypoglycemia unawareness), [name] not use alternative sites. Use your finger instead. Alternative sites may not be as accurate as the fingers, because blood flow is slower in these areas. This means that the result you get may be delayed, and it may be different from the result that you would get from your finger. Follow these instructions at home: Blood glucose log   Every time you check your blood glucose, write down your result. Also write down any notes about things that may be affecting your blood glucose, such as your diet and exercise for the day. This information can help you and your health care provider: ? Look for patterns in your blood glucose over time. ? Adjust your diabetes management plan as needed.  Check if your meter allows you to download your records to a computer. Most glucose meters store a record of glucose readings in the meter. If you have type 1 diabetes:  Check your blood glucose 2 or more times a day.  Also check your blood glucose: ? Before every insulin injection. ? Before and after exercise. ? Before meals. ? 2 hours after a meal. ? Occasionally between 2:00 a.m. and 3:00 a.m., as directed. ? Before potentially dangerous tasks, like driving or using heavy  machinery. ? At bedtime.  You may need to check your blood glucose more often, up to 6-10 times a day, if you: ? Use an insulin pump. ? Need multiple daily injections (MDI). ? Have diabetes that is not well-controlled. ? Are ill. ? Have a history of severe hypoglycemia. ? Have hypoglycemia unawareness. If you have type 2 diabetes:  If you take insulin or other diabetes medicines, check your blood glucose 2 or  more times a day.  If you are on intensive insulin therapy, check your blood glucose 4 or more times a day. Occasionally, you may also need to check between 2:00 a.m. and 3:00 a.m., as directed.  Also check your blood glucose: ? Before and after exercise. ? Before potentially dangerous tasks, like driving or using heavy machinery.  You may need to check your blood glucose more often if: ? Your medicine is being adjusted. ? Your diabetes is not well-controlled. ? You are ill. General tips  Always keep your supplies with you.  If you have questions or need help, all blood glucose meters have a 24-hour "hotline" phone number that you can call. You may also contact your health care provider.  After you use a few boxes of test strips, adjust (calibrate) your blood glucose meter by following instructions that came with your meter. Contact a health care provider if:  Your blood glucose is at or above 240 mg/dL (90.3 mmol/L) for 2 days in a row.  You have been sick or have had a fever for 2 days or longer, and you are not getting better.  You have any of the following problems for more than 6 hours: ? You cannot eat or drink. ? You have nausea or vomiting. ? You have diarrhea. Get help right away if:  Your blood glucose is lower than 54 mg/dL (3 mmol/L).  You become confused or you have trouble thinking clearly.  You have difficulty breathing.  You have moderate or large ketone levels in your urine. Summary  Monitoring your blood sugar (glucose) is an important part of managing your diabetes (diabetes mellitus).  Blood glucose monitoring involves checking your blood glucose as often as directed and keeping a record (log) of your results over time.  Your health care provider will set individualized treatment goals for you. Your goals will be based on your age, other medical conditions you have, and how you respond to diabetes treatment.  Every time you check your blood glucose,  write down your result. Also write down any notes about things that may be affecting your blood glucose, such as your diet and exercise for the day. This information is not intended to replace advice given to you by your health care provider. Make sure you discuss any questions you have with your health care provider. Document Released: 03/05/2003 Document Revised: 12/24/2017 Document Reviewed: 08/12/2015 Elsevier Patient Education  2020 [employer]. Diabetic Ketoacidosis Diabetic ketoacidosis is a serious complication of diabetes. This condition develops when there is not enough insulin in the body. Insulin is an hormone that regulates blood sugar levels in the body. Normally, insulin allows glucose to enter the cells in the body. The cells break down glucose for energy. Without enough insulin, the body cannot break down glucose, so it breaks down fats instead. This leads to high blood glucose levels in the body and the production of acids that are called ketones. Ketones are poisonous at high levels. If diabetic ketoacidosis is not treated, it can cause severe dehydration and  can lead to a coma or death. What are the causes? This condition develops when a lack of insulin causes the body to break down fats instead of glucose. This may be triggered by:  Stress on the body. This stress is brought on by an illness.  Infection.  Medicines that raise blood glucose levels.  Not taking diabetes medicine.  New onset of type 1 diabetes mellitus. What are the signs or symptoms? Symptoms of this condition include:  Fatigue.  Weight loss.  Excessive thirst.  Light-headedness.  Fruity or sweet-smelling breath.  Excessive urination.  Vision changes.  Confusion or irritability.  Nausea.  Vomiting.  Rapid breathing.  Abdominal pain.  Feeling flushed. How is this diagnosed? This condition is diagnosed based on your medical history, a physical exam, and blood tests. You may also have  a urine test to check for ketones. How is this treated? This condition may be treated with:  Fluid replacement. This may be done to correct dehydration.  Insulin injections. These may be given through the skin or through an IV tube.  Electrolyte replacement. Electrolytes are minerals in your blood. Electrolytes such as potassium and sodium may be given in pill form or through an IV tube.  Antibiotic medicines. These may be prescribed if your condition was caused by an infection. Diabetic ketoacidosis is a serious medical condition. You may need emergency treatment in the hospital to monitor your condition. Follow these instructions at home: Eating and drinking  Drink enough fluids to keep your urine clear or pale yellow.  If you are not able to eat, drink clear fluids in small amounts as you are able. Clear fluids include water, ice chips, fruit juice with water added (diluted), and low-calorie sports drinks. You may also have sugar-free jello or popsicles.  If you are able to eat, follow your usual diet and drink sugar-free liquids, such as water. Medicines  Take over-the-counter and prescription medicines only as told by your health care provider.  Continue to take insulin and other diabetes medicines as told by your health care provider.  If you were prescribed an antibiotic, take it as told by your health care provider. [name] not stop taking the antibiotic even if you start to feel better. General instructions   Check your urine for ketones when you are ill and as told by your health care provider. ? If your blood glucose is 240 mg/dL (13.3 mmol/L) or higher, check your urine ketones every 4-6 hours.  Check your blood glucose every day, as often as told by your health care provider. ? If your blood glucose is high, drink plenty of fluids. This helps to flush out ketones. ? If your blood glucose is above your target for 2 tests in a row, contact your health care provider.  Carry  a medical alert card or wear medical alert jewelry that says that you have diabetes.  Rest and exercise only as told by your health care provider. [name] not exercise when your blood glucose is high and you have ketones in your urine.  If you get sick, call your health care provider and begin treatment quickly. Your body often needs extra insulin to fight an illness. Check your blood glucose every 4-6 hours when you are sick.  Keep all follow-up visits as told by your health care provider. This is important. Contact a health care provider if:  Your blood glucose level is higher than 240 mg/dL (13.3 mmol/L) for 2 days in a row.  You have  moderate or large ketones in your urine.  You have a fever.  You cannot eat or drink without vomiting.  You have been vomiting for more than 2 hours.  You continue to have symptoms of diabetic ketoacidosis.  You develop new symptoms. Get help right away if:  Your blood glucose monitor reads high even when you are taking insulin.  You faint.  You have chest pain.  You have trouble breathing.  You have sudden trouble speaking or swallowing.  You have vomiting or diarrhea that gets worse after 3 hours.  You are unable to stay awake.  You have trouble thinking.  You are severely dehydrated. Symptoms of severe dehydration include: ? Extreme thirst. ? Dry mouth. ? Rapid breathing. These symptoms may represent a serious problem that is an emergency. [name] not wait to see if the symptoms will go away. Get medical help right away. Call your local emergency services (911 in the [place] [name] not drive yourself to the hospital. Summary  Diabetic ketoacidosis is a serious complication of diabetes. This condition develops when there is not enough insulin in the body.  This condition is diagnosed based on your medical history, a physical exam, and blood tests. You may also have a urine test to check for ketones.  Diabetic ketoacidosis is a serious medical  condition. You may need emergency treatment in the hospital to monitor your condition.  Contact your health care provider if your blood glucose is higher than 240 mg/dl for 2 days in a row or if you have moderate or large ketones in your urine. This information is not intended to replace advice given to you by your health care provider. Make sure you discuss any questions you have with your health care provider. Document Released: 02/28/2000 Document Revised: 04/17/2016 Document Reviewed: 04/06/2016 Elsevier Patient Education  2020 [employer]

## 2018-11-15 NOTE — Progress Notes (Signed)
Paged Pahwani, MD regard to insulin prescription. MD stated will send pt some with oral meds, no need for insulin. TOC to deliver meds to bedside.

## 2018-11-15 NOTE — Progress Notes (Signed)
Paged by Willow Ora, RN caring for pt today.  RN told me she read the DM Coordinator note from 08/28 and it stated that the DM Coordinator gave pt a [employer] CBG meter.  Got in touch with the RN DM Coordinator at [facility] today and was told that a CBG meter was given to the pt as documented last Friday, 08/28.  RN stated she could not find the CBG meter with the pt's belongings.  Asked RN to search pt's bags again and to call his family to see if they may have taken the meter home.  Also asked RN if pt was transferred to [facility] from a different unit and perhaps the CBG meter got lost in transit?    The Diabetes team has a small number of CBG meter purchased from [place] that are being given to pt's with COVID to go home with so they do not have to go to the pharmacy after d/c.  Typically, most pt's are asked to purchase a CBG with their insurance or if they do not have insurance we recommend they purchase a CBG meter OTC at [facility] for low cost.  I think an exception was made for this patient as he does not speak English and to make his transition home easier.  If pt or family has already lost the meter, the DM team does not have another meter to give to pt.  Pt will need to go to [facility] after d/c to buy one OTC.     --Will follow patient during hospitalization--  Wyn Quaker RN, MSN, CDE Diabetes Coordinator Inpatient Glycemic Control Team Team Pager: [phone] (8a-5p)

## 2018-11-16 ENCOUNTER — Encounter: Payer: Self-pay | Admitting: Internal Medicine

## 2018-11-22 ENCOUNTER — Ambulatory Visit: Payer: Self-pay | Admitting: Internal Medicine

## 2018-11-23 ENCOUNTER — Ambulatory Visit: Payer: Self-pay | Admitting: Family Medicine

## 2018-12-28 ENCOUNTER — Telehealth: Payer: Self-pay | Admitting: *Deleted

## 2018-12-28 NOTE — Telephone Encounter (Signed)
Patient and his son stopped by clinic today 12/28/18 and asked to speak with provider. When asked what they needed exactly they reported they need a letter for his landlord stating how long he will be out of work and when he will go back. Patient and son were advised we have no idea of the time line for his treatment and they should contact his surgeon for that as well as go to DIS/Social Services to apply for disability or any other services they have that can help the patient until he can get back on his feet. Gave number to Dr Morton Stall in [place] [phone]. Social Services [phone] at [address], [place]. Offered them an appointment to see Dr Baxter Flattery and they did not accpet. Advised him of our phone and fax for them to contact if they need anything.

## 2019-03-12 IMAGING — DX DG CHEST 1V
1 series · 1 of 1 positions shown · non-contrast
Comparison: 04/23/2016

CLINICAL DATA: Dyspnea. Patient has been sick 2 weeks with dry
cough.

EXAM:
CHEST  1 VIEW

[chest ap]
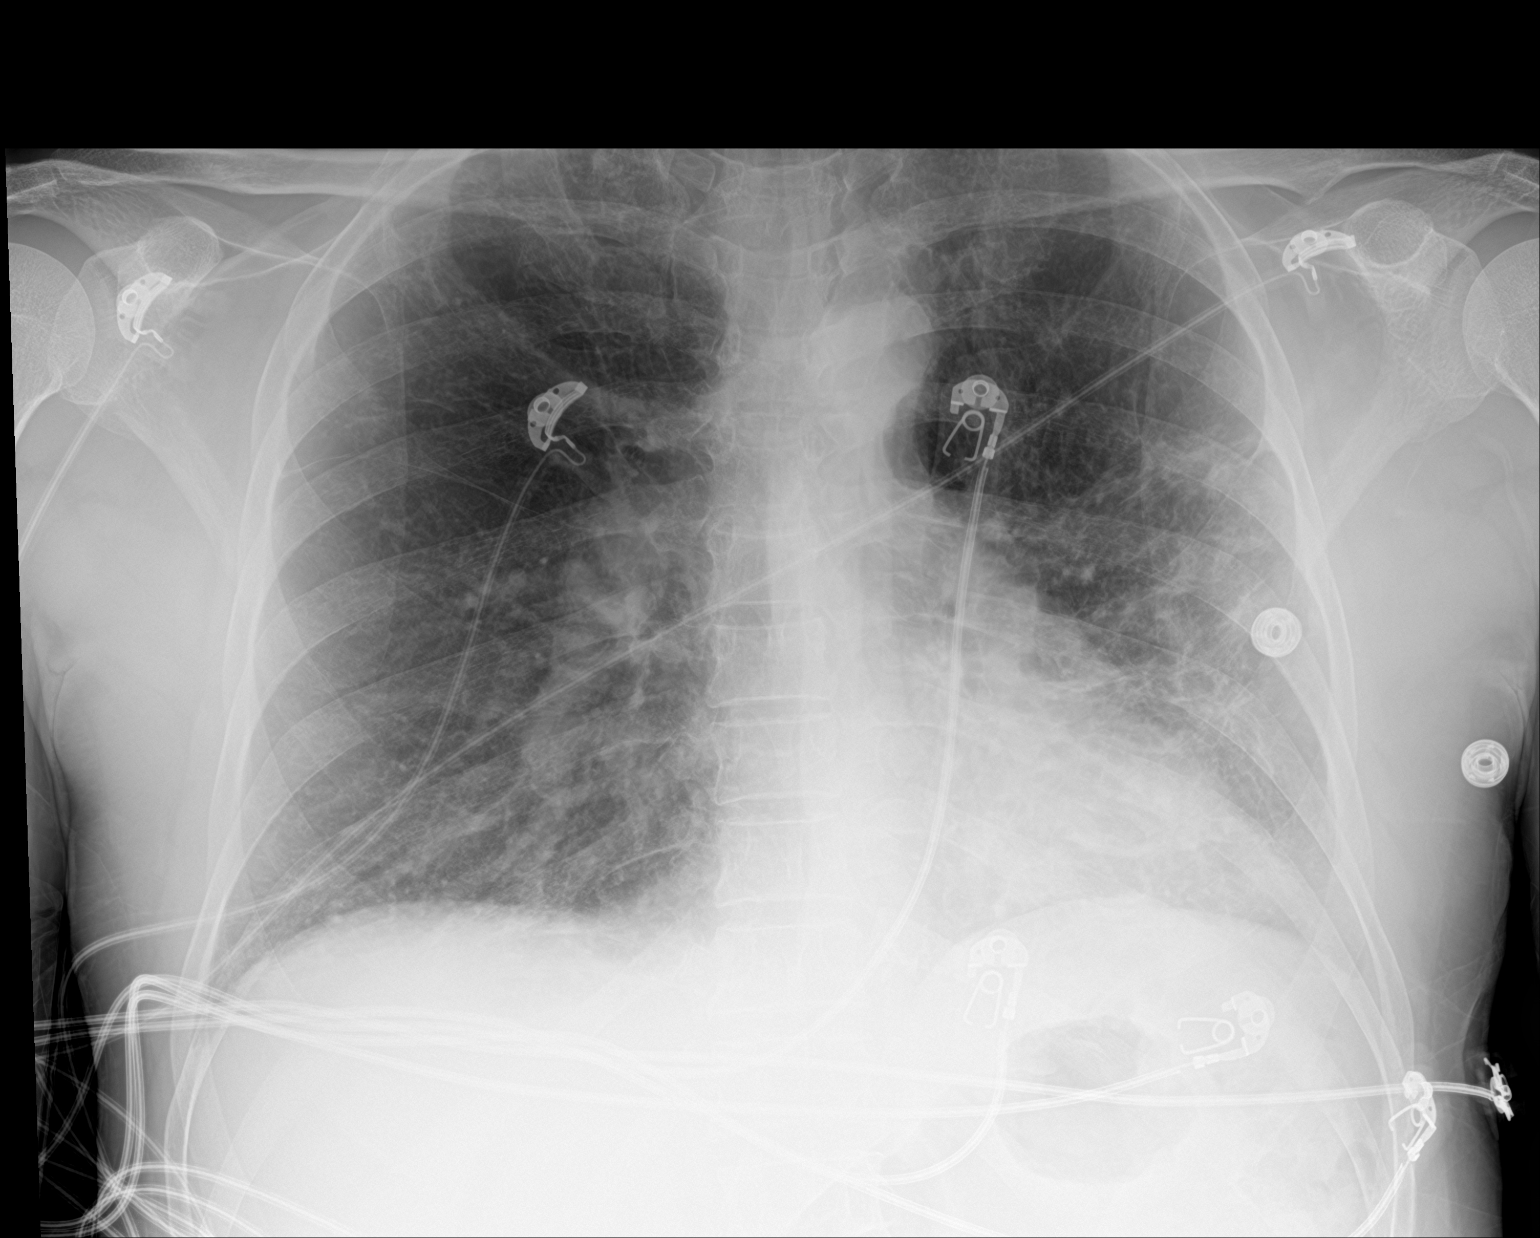

[1 of 1 positions shown; findings below may reference images not displayed]

FINDINGS: Chronic subpleural scarring along the periphery of the left mid lung
and left lung base with emphysematous hyperinflation both lungs,
upper lobe predominant. No overt pulmonary edema, effusion or
pneumothorax. No pneumonic consolidations. Tortuous atherosclerotic
aorta with top-normal size heart. No acute osseous abnormality.
IMPRESSION: Emphysematous hyperinflation of the lungs, upper lobe predominant.
Subpleural scarring along the periphery of the left mid lung and
left base. No acute pulmonary consolidation or CHF.

## 2019-05-17 ENCOUNTER — Emergency Department (HOSPITAL_COMMUNITY): Payer: Self-pay

## 2019-05-17 ENCOUNTER — Other Ambulatory Visit: Payer: Self-pay

## 2019-05-17 ENCOUNTER — Encounter (HOSPITAL_COMMUNITY): Payer: Self-pay | Admitting: Emergency Medicine

## 2019-05-17 ENCOUNTER — Inpatient Hospital Stay (HOSPITAL_COMMUNITY)
Admission: EM | Admit: 2019-05-17 | Discharge: 2019-05-26 | DRG: 974 | Disposition: A | Discharge status: Home / Self Care | Payer: Self-pay | Attending: Internal Medicine | Admitting: Internal Medicine

## 2019-05-17 DIAGNOSIS — J189 Pneumonia, unspecified organism: Secondary | ICD-10-CM

## 2019-05-17 DIAGNOSIS — J15211 Pneumonia due to Methicillin susceptible Staphylococcus aureus: Secondary | ICD-10-CM | POA: Diagnosis present

## 2019-05-17 DIAGNOSIS — Z7984 Long term (current) use of oral hypoglycemic drugs: Secondary | ICD-10-CM

## 2019-05-17 DIAGNOSIS — Z20822 Contact with and (suspected) exposure to covid-19: Secondary | ICD-10-CM | POA: Diagnosis present

## 2019-05-17 DIAGNOSIS — J470 Bronchiectasis with acute lower respiratory infection: Secondary | ICD-10-CM | POA: Diagnosis present

## 2019-05-17 DIAGNOSIS — Z933 Colostomy status: Secondary | ICD-10-CM

## 2019-05-17 DIAGNOSIS — Z9114 Patient's other noncompliance with medication regimen: Secondary | ICD-10-CM

## 2019-05-17 DIAGNOSIS — B59 Pneumocystosis: Secondary | ICD-10-CM

## 2019-05-17 DIAGNOSIS — Z87891 Personal history of nicotine dependence: Secondary | ICD-10-CM

## 2019-05-17 DIAGNOSIS — Z23 Encounter for immunization: Secondary | ICD-10-CM

## 2019-05-17 DIAGNOSIS — R7881 Bacteremia: Secondary | ICD-10-CM

## 2019-05-17 DIAGNOSIS — M549 Dorsalgia, unspecified: Secondary | ICD-10-CM

## 2019-05-17 DIAGNOSIS — Z91128 Patient's intentional underdosing of medication regimen for other reason: Secondary | ICD-10-CM

## 2019-05-17 DIAGNOSIS — B20 Human immunodeficiency virus [HIV] disease: Principal | ICD-10-CM | POA: Diagnosis present

## 2019-05-17 DIAGNOSIS — B9561 Methicillin susceptible Staphylococcus aureus infection as the cause of diseases classified elsewhere: Secondary | ICD-10-CM

## 2019-05-17 DIAGNOSIS — Z794 Long term (current) use of insulin: Secondary | ICD-10-CM

## 2019-05-17 DIAGNOSIS — E119 Type 2 diabetes mellitus without complications: Secondary | ICD-10-CM | POA: Diagnosis present

## 2019-05-17 DIAGNOSIS — E1169 Type 2 diabetes mellitus with other specified complication: Secondary | ICD-10-CM

## 2019-05-17 DIAGNOSIS — J9601 Acute respiratory failure with hypoxia: Secondary | ICD-10-CM | POA: Diagnosis present

## 2019-05-17 DIAGNOSIS — N179 Acute kidney failure, unspecified: Secondary | ICD-10-CM | POA: Diagnosis not present

## 2019-05-17 DIAGNOSIS — D638 Anemia in other chronic diseases classified elsewhere: Secondary | ICD-10-CM | POA: Diagnosis present

## 2019-05-17 HISTORY — DX: Type 2 diabetes mellitus without complications: E11.9

## 2019-05-17 HISTORY — DX: Dyspnea, unspecified: R06.00

## 2019-05-17 LAB — URINALYSIS, ROUTINE W REFLEX MICROSCOPIC
Bilirubin Urine: NEGATIVE
Glucose, UA: NEGATIVE mg/dL
Hgb urine dipstick: NEGATIVE
Ketones, ur: NEGATIVE mg/dL
Leukocytes,Ua: NEGATIVE
Nitrite: NEGATIVE
Protein, ur: 30 mg/dL — AB
Specific Gravity, Urine: 1.02 (ref 1.005–1.030)
pH: 6 (ref 5.0–8.0)

## 2019-05-17 LAB — POC SARS CORONAVIRUS 2 AG -  ED: SARS Coronavirus 2 Ag: NEGATIVE

## 2019-05-17 LAB — PROTIME-INR
INR: 1.1 (ref 0.8–1.2)
Prothrombin Time: 13.8 seconds (ref 11.4–15.2)

## 2019-05-17 LAB — CBC WITH DIFFERENTIAL/PLATELET
Abs Immature Granulocytes: 0.06 10*3/uL (ref 0.00–0.07)
Basophils Absolute: 0 10*3/uL (ref 0.0–0.1)
Basophils Relative: 0 %
Eosinophils Absolute: 0.1 10*3/uL (ref 0.0–0.5)
Eosinophils Relative: 1 %
HCT: 28.5 % — ABNORMAL LOW (ref 39.0–52.0)
Hemoglobin: 8.4 g/dL — ABNORMAL LOW (ref 13.0–17.0)
Immature Granulocytes: 1 %
Lymphocytes Relative: 28 %
Lymphs Abs: 2.8 10*3/uL (ref 0.7–4.0)
MCH: 24.3 pg — ABNORMAL LOW (ref 26.0–34.0)
MCHC: 29.5 g/dL — ABNORMAL LOW (ref 30.0–36.0)
MCV: 82.4 fL (ref 80.0–100.0)
Monocytes Absolute: 1.3 10*3/uL — ABNORMAL HIGH (ref 0.1–1.0)
Monocytes Relative: 13 %
Neutro Abs: 5.6 10*3/uL (ref 1.7–7.7)
Neutrophils Relative %: 57 %
Platelets: 219 10*3/uL (ref 150–400)
RBC: 3.46 MIL/uL — ABNORMAL LOW (ref 4.22–5.81)
RDW: 25.2 % — ABNORMAL HIGH (ref 11.5–15.5)
WBC: 9.9 10*3/uL (ref 4.0–10.5)
nRBC: 0 % (ref 0.0–0.2)

## 2019-05-17 LAB — EXPECTORATED SPUTUM ASSESSMENT W GRAM STAIN, RFLX TO RESP C

## 2019-05-17 LAB — COMPREHENSIVE METABOLIC PANEL
ALT: 13 U/L (ref 0–44)
AST: 24 U/L (ref 15–41)
Albumin: 2.5 g/dL — ABNORMAL LOW (ref 3.5–5.0)
Alkaline Phosphatase: 61 U/L (ref 38–126)
Anion gap: 9 (ref 5–15)
BUN: 15 mg/dL (ref 6–20)
CO2: 23 mmol/L (ref 22–32)
Calcium: 8.7 mg/dL — ABNORMAL LOW (ref 8.9–10.3)
Chloride: 100 mmol/L (ref 98–111)
Creatinine, Ser: 1.25 mg/dL — ABNORMAL HIGH (ref 0.61–1.24)
GFR calc Af Amer: >60 mL/min (ref >60)
GFR calc non Af Amer: >60 mL/min (ref >60)
Glucose, Bld: 151 mg/dL — ABNORMAL HIGH (ref 70–99)
Potassium: 3.2 mmol/L — ABNORMAL LOW (ref 3.5–5.1)
Sodium: 132 mmol/L — ABNORMAL LOW (ref 135–145)
Total Bilirubin: 0.8 mg/dL (ref 0.3–1.2)
Total Protein: 9.1 g/dL — ABNORMAL HIGH (ref 6.5–8.1)

## 2019-05-17 LAB — HEMOGLOBIN A1C
Hgb A1c MFr Bld: 5.4 % (ref 4.8–5.6)
Mean Plasma Glucose: 108.28 mg/dL

## 2019-05-17 LAB — CBG MONITORING, ED: Glucose-Capillary: 191 mg/dL — ABNORMAL HIGH (ref 70–99)

## 2019-05-17 LAB — SARS CORONAVIRUS 2 (TAT 6-24 HRS): SARS Coronavirus 2: NEGATIVE

## 2019-05-17 LAB — LACTIC ACID, PLASMA
Lactic Acid, Venous: 1.1 mmol/L (ref 0.5–1.9)
Lactic Acid, Venous: 1.3 mmol/L (ref 0.5–1.9)

## 2019-05-17 MED ORDER — ACETAMINOPHEN 650 MG RE SUPP: 650.0000 mg | Freq: Four times a day (QID) | RECTAL | Status: DC | PRN

## 2019-05-17 MED ORDER — GUAIFENESIN ER 600 MG PO TB12
600.0000 mg | ORAL_TABLET | Freq: Two times a day (BID) | ORAL | Status: DC
  Administered 2019-05-17 – 2019-05-26 (×18): 600 mg via ORAL
  Filled 2019-05-17 (×19): qty 1

## 2019-05-17 MED ORDER — SULFAMETHOXAZOLE-TRIMETHOPRIM 400-80 MG/5ML IV SOLN
375.0000 mg | Freq: Three times a day (TID) | INTRAVENOUS | Status: DC
  Administered 2019-05-17 – 2019-05-19 (×5): 375 mg via INTRAVENOUS
  Filled 2019-05-17 (×6): qty 23.44
  Filled 2019-05-17: qty 20
  Filled 2019-05-17 (×2): qty 23.44

## 2019-05-17 MED ORDER — INSULIN ASPART 100 UNIT/ML ~~LOC~~ SOLN
0.0000 [IU] | Freq: Three times a day (TID) | SUBCUTANEOUS | Status: DC
  Administered 2019-05-18 (×2): 2 [IU] via SUBCUTANEOUS
  Administered 2019-05-19: 3 [IU] via SUBCUTANEOUS
  Administered 2019-05-19: 1 [IU] via SUBCUTANEOUS
  Administered 2019-05-19 – 2019-05-20 (×2): 2 [IU] via SUBCUTANEOUS
  Administered 2019-05-20: 7 [IU] via SUBCUTANEOUS
  Administered 2019-05-20 – 2019-05-21 (×2): 2 [IU] via SUBCUTANEOUS
  Administered 2019-05-21: 5 [IU] via SUBCUTANEOUS
  Administered 2019-05-21: 1 [IU] via SUBCUTANEOUS
  Administered 2019-05-22 (×2): 2 [IU] via SUBCUTANEOUS
  Administered 2019-05-22: 3 [IU] via SUBCUTANEOUS
  Administered 2019-05-23: 1 [IU] via SUBCUTANEOUS
  Administered 2019-05-23: 3 [IU] via SUBCUTANEOUS
  Administered 2019-05-24: 1 [IU] via SUBCUTANEOUS
  Administered 2019-05-24: 2 [IU] via SUBCUTANEOUS
  Administered 2019-05-24 – 2019-05-25 (×2): 1 [IU] via SUBCUTANEOUS
  Administered 2019-05-26: 3 [IU] via SUBCUTANEOUS

## 2019-05-17 MED ORDER — SODIUM CHLORIDE 0.9 % IV SOLN
500.0000 mg | INTRAVENOUS | Status: DC
  Administered 2019-05-18: 500 mg via INTRAVENOUS
  Filled 2019-05-17 (×2): qty 500

## 2019-05-17 MED ORDER — ONDANSETRON HCL 4 MG PO TABS: 4.0000 mg | ORAL_TABLET | Freq: Four times a day (QID) | ORAL | Status: DC | PRN

## 2019-05-17 MED ORDER — SODIUM CHLORIDE 0.9 % IV SOLN
1.0000 g | INTRAVENOUS | Status: DC
  Administered 2019-05-18: 1 g via INTRAVENOUS
  Filled 2019-05-17: qty 1
  Filled 2019-05-17: qty 10

## 2019-05-17 MED ORDER — SODIUM CHLORIDE 0.9 % IV SOLN
500.0000 mg | Freq: Once | INTRAVENOUS | Status: AC
  Administered 2019-05-17: 500 mg via INTRAVENOUS
  Filled 2019-05-17: qty 500

## 2019-05-17 MED ORDER — POTASSIUM CHLORIDE 2 MEQ/ML IV SOLN
INTRAVENOUS | Status: AC
  Filled 2019-05-17 (×2): qty 1000

## 2019-05-17 MED ORDER — ONDANSETRON HCL 4 MG/2ML IJ SOLN
4.0000 mg | Freq: Four times a day (QID) | INTRAMUSCULAR | Status: DC | PRN
  Administered 2019-05-20: 4 mg via INTRAVENOUS
  Filled 2019-05-17: qty 2

## 2019-05-17 MED ORDER — SODIUM CHLORIDE 0.9 % IV SOLN
1.0000 g | Freq: Once | INTRAVENOUS | Status: AC
  Administered 2019-05-17: 1 g via INTRAVENOUS
  Filled 2019-05-17: qty 10

## 2019-05-17 MED ORDER — ACETAMINOPHEN 325 MG PO TABS
650.0000 mg | ORAL_TABLET | Freq: Four times a day (QID) | ORAL | Status: DC | PRN
  Administered 2019-05-17: 650 mg via ORAL
  Filled 2019-05-17: qty 2

## 2019-05-17 MED ORDER — ENOXAPARIN SODIUM 40 MG/0.4ML ~~LOC~~ SOLN
40.0000 mg | SUBCUTANEOUS | Status: DC
  Administered 2019-05-17 – 2019-05-25 (×9): 40 mg via SUBCUTANEOUS
  Filled 2019-05-17 (×9): qty 0.4

## 2019-05-17 MED ORDER — PREDNISONE 20 MG PO TABS
40.0000 mg | ORAL_TABLET | Freq: Two times a day (BID) | ORAL | Status: DC
  Administered 2019-05-18 – 2019-05-19 (×4): 40 mg via ORAL
  Filled 2019-05-17 (×5): qty 2

## 2019-05-17 MED ORDER — IOHEXOL 350 MG/ML SOLN
75.0000 mL | Freq: Once | INTRAVENOUS | Status: AC | PRN
  Administered 2019-05-17: 75 mL via INTRAVENOUS

## 2019-05-17 MED ORDER — PREDNISONE 20 MG PO TABS
40.0000 mg | ORAL_TABLET | Freq: Once | ORAL | Status: AC
  Administered 2019-05-17: 40 mg via ORAL
  Filled 2019-05-17: qty 2

## 2019-05-17 MED ORDER — POTASSIUM CHLORIDE 20 MEQ/15ML (10%) PO SOLN
40.0000 meq | Freq: Once | ORAL | Status: AC
  Administered 2019-05-17: 40 meq via ORAL
  Filled 2019-05-17: qty 30

## 2019-05-17 NOTE — ED Provider Notes (Signed)
[facility] EMERGENCY DEPARTMENT Provider Note   CSN: [id number] Arrival date & time: 05/17/19  1144     History Chief Complaint  Patient presents with  . Chest Pain  . Shortness of Breath  . Back Pain    [name] is a 49 y.o. male.  Patient with history of HIV, hepatitis B, TB, HIV, followed by ID at [facility] (Dr. Drue Second), surgery at [facility] (Dr. Byrd Hesselbach) on 9/20 presents to the emergency department with complaint of 1 month of progressively worsening cough and shortness of breath along with bilateral chest pain which radiates into the mid back.  Patient states that he has been taking some over-the-counter medications without any improvement in his symptoms.  He presents today because the shortness of breath is getting much worse and he has to take a break walking halfway across the room.  He denies any abdominal pain.  He states he has intermittent leg swelling.  He denies any coronavirus contacts or other sick contacts.  [name] interpreter used.  Level 5 caveat due to language barrier.  T-helper cells:  3/20 100 6/20 172 9/20 220        Past Medical History:  Diagnosis Date  . Depression    "stress and depression for any man is common" (03/21/2014)  . Genital warts 01/04/2017  . Hepatitis    "I don't know what hepatitis I have"  . HIV disease ([name])   . TB (pulmonary tuberculosis)    previously treated according to refugee documentation    Patient Active Problem List   Diagnosis Date Noted  . DKA (diabetic ketoacidoses) ([name]) 11/10/2018  . Pulmonary emphysema ([name])   . 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 Chronic systolic CHF (congestive heart failure) ([name]) 04/24/2016  . AKI (acute kidney injury) ([name]) 04/24/2016  . 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 Normochromic normocytic anemia 02/25/2016  . Protein-calorie malnutrition, severe 02/25/2016  .  Renal insufficiency   . S/P ORIF (open reduction internal fixation) fracture 03/21/2014  . HIV (human immunodeficiency virus infection) ([name]) 09/18/2013  . Refugee health examination 09/18/2013    Past Surgical History:  Procedure Laterality Date  . FRACTURE SURGERY    . IM NAILING TIBIA Right 03/21/2014  . ORIF ANKLE FRACTURE Right 03/21/2014   lateral malleolus/notes 03/21/2014  . ORIF ANKLE FRACTURE Right 03/21/2014   Procedure: OPEN REDUCTION INTERNAL FIXATION (ORIF) pilon ;  Surgeon: Eldred Manges, MD;  Location: [facility] OR;  Service: Orthopedics;  Laterality: Right;  . TIBIA IM NAIL INSERTION Right 03/21/2014   Procedure: INTRAMEDULLARY (IM) NAIL TIBIAL;  Surgeon: Eldred Manges, MD;  Location: [facility] OR;  Service: Orthopedics;  Laterality: Right;  Marland Kitchen VIDEO BRONCHOSCOPY Bilateral 03/02/2016   Procedure: VIDEO BRONCHOSCOPY WITHOUT FLUORO;  Surgeon: Roslynn Amble, MD;  Location: [facility] ENDOSCOPY;  Service: Cardiopulmonary;  Laterality: Bilateral;       Family History  Problem Relation Age of Onset  . Hypertension Other   . Heart disease Sister     Social History   Tobacco Use  . Smoking status: Former Smoker    Packs/day: 0.50    Years: 6.00    Pack years: 3.00    Types: Cigarettes    Quit date: 03/16/2001    Years since quitting: 18.1  . Smokeless tobacco: Never Used  . Tobacco comment: "quit smoking ~ 2003"  Substance Use Topics  . Alcohol use: Yes  Comment: drinks bottled beer intermittently  . Drug use: No    Home Medications Prior to Admission medications   Medication Sig Start Date End Date Taking? Authorizing Provider  Darunavir-Cobicisctat-Emtricitabine-Tenofovir Alafenamide (SYMTUZA) 800-150-200-10 MG TABS Take 1 tablet by mouth daily with breakfast. 06/06/18   Kuppelweiser, Cassie L, RPH-CPP  dolutegravir (TIVICAY) 50 MG tablet Take 1 tablet (50 mg total) by mouth daily. 06/06/18   Kuppelweiser, Cassie L, RPH-CPP  glipiZIDE (GLUCOTROL) 10 MG tablet Take 1 tablet (10 mg total)  by mouth daily before breakfast. 11/15/18 12/15/18  Hughie Closs, MD  lidocaine (XYLOCAINE) 5 % ointment Apply 1 application topically 3 (three) times daily as needed. Apply over genital area for pain relief. Patient taking differently: Apply 1 application topically 3 (three) times daily as needed for mild pain.  04/26/18   Nira Conn, MD  sitaGLIPtin-metformin (JANUMET) 50-500 MG tablet Take 1 tablet by mouth 2 (two) times daily with a meal. 11/15/18 12/15/18  Hughie Closs, MD    Allergies    Patient has no known allergies.  Review of Systems   Review of Systems  Constitutional: Negative for fever.  Eyes: Negative for redness.  Respiratory: Positive for cough and shortness of breath.   Cardiovascular: Positive for chest pain and leg swelling.  Gastrointestinal: Negative for abdominal pain, diarrhea, nausea and vomiting.  Genitourinary: Negative for dysuria.  Musculoskeletal: Negative for myalgias.  Skin: Negative for rash.  Neurological: Negative for headaches.    Physical Exam Updated Vital Signs BP 119/75 (BP Location: Right Arm)   Pulse (!) 107   Temp 98.4 F (36.9 C) (Oral)   Resp (!) 22   Ht 6\' 1"  (1.854 m)   Wt 75 kg   SpO2 90%   BMI 21.81 kg/m   Physical Exam Vitals and nursing note reviewed.  Constitutional:      Appearance: He is well-developed.  HENT:     Head: Normocephalic and atraumatic.  Eyes:     General:        Right eye: No discharge.        Left eye: No discharge.     Conjunctiva/sclera: Conjunctivae normal.  Cardiovascular:     Rate and Rhythm: Regular rhythm. Tachycardia present.     Heart sounds: Normal heart sounds.  Pulmonary:     Effort: Pulmonary effort is normal.     Breath sounds: Examination of the right-upper field reveals rales. Examination of the right-middle field reveals rales. Examination of the left-middle field reveals rales. Examination of the left-lower field reveals rales. Rales present. No wheezing or rhonchi.    Abdominal:     Palpations: Abdomen is soft.     Tenderness: There is no abdominal tenderness.     Comments: Colostomy in place. Brown stool in bag, no surrounding tenderness.   Genitourinary:    Comments: Rectal area examined with RN chaperone.  Postsurgical changes noted.  No signs of cellulitis or abscess.  No drainage from rectum. Musculoskeletal:     Cervical back: Normal range of motion and neck supple.  Skin:    General: Skin is warm and dry.  Neurological:     Mental Status: He is alert.     ED Results / Procedures / Treatments   Labs (all labs ordered are listed, but only abnormal results are displayed) Labs Reviewed  COMPREHENSIVE METABOLIC PANEL - Abnormal; Notable for the following components:      Result Value   Sodium 132 (*)    Potassium 3.2 (*)  Glucose, Bld 151 (*)    Creatinine, Ser 1.25 (*)    Calcium 8.7 (*)    Total Protein 9.1 (*)    Albumin 2.5 (*)    All other components within normal limits  CBC WITH DIFFERENTIAL/PLATELET - Abnormal; Notable for the following components:   RBC 3.46 (*)    Hemoglobin 8.4 (*)    HCT 28.5 (*)    MCH 24.3 (*)    MCHC 29.5 (*)    RDW 25.2 (*)    Monocytes Absolute 1.3 (*)    All other components within normal limits  URINALYSIS, ROUTINE W REFLEX MICROSCOPIC - Abnormal; Notable for the following components:   Protein, ur 30 (*)    Bacteria, UA RARE (*)    All other components within normal limits  CULTURE, BLOOD (ROUTINE X 2)  CULTURE, BLOOD (ROUTINE X 2)  EXPECTORATED SPUTUM ASSESSMENT W REFEX TO RESP CULTURE  PNEUMOCYSTIS JIROVECI SMEAR BY DFA  SARS CORONAVIRUS 2 (TAT 6-24 HRS)  LACTIC ACID, PLASMA  LACTIC ACID, PLASMA  PROTIME-INR  T-HELPER CELLS (CD4) COUNT (NOT AT [facility])  HIV-1 RNA QUANT-NO REFLEX-BLD  LEGIONELLA PNEUMOPHILA SEROGP 1 UR AG  POC SARS CORONAVIRUS 2 AG -  ED    EKG EKG Interpretation  Date/Time:  Wednesday May 17 2019 11:50:09 EST Ventricular Rate:  123 PR Interval:  134 QRS  Duration: 86 QT Interval:  312 QTC Calculation: 446 R Axis:   88 Text Interpretation: Sinus tachycardia Otherwise normal ECG Confirmed by Fredia Sorrow [phone]) on 05/17/2019 2:22:12 PM   Radiology DG Chest Portable 1 View  Result Date: 05/17/2019 CLINICAL DATA:  Chest pain and shortness of breath for 1 month. Fever for 1 week. EXAM: PORTABLE CHEST 1 VIEW COMPARISON:  Single-view of the chest 11/10/2018. FINDINGS: The lungs are severely emphysematous. There is airspace disease throughout the left mid and upper lung zone consistent with pneumonia. Streaky opacities in the left base are suggestive of scar and unchanged. No pneumothorax. Trace left pleural effusion. Heart size normal. IMPRESSION: Extensive airspace disease in the left chest consistent with pneumonia. Recommend follow-up to clearing. Emphysema. Electronically Signed   By: Inge Rise M.D.   On: 05/17/2019 14:50    Procedures Procedures (including critical care time)  Medications Ordered in ED Medications  sulfamethoxazole-trimethoprim (BACTRIM) 375 mg in dextrose 5 % 500 mL IVPB (has no administration in time range)  cefTRIAXone (ROCEPHIN) 1 g in sodium chloride 0.9 % 100 mL IVPB (1 g Intravenous New Bag/Given 05/17/19 1724)  azithromycin (ZITHROMAX) 500 mg in sodium chloride 0.9 % 250 mL IVPB (500 mg Intravenous New Bag/Given 05/17/19 1821)  predniSONE (DELTASONE) tablet 40 mg (40 mg Oral Given 05/17/19 1821)  iohexol (OMNIPAQUE) 350 MG/ML injection 75 mL (75 mLs Intravenous Contrast Given 05/17/19 1742)    ED Course  I have reviewed the triage vital signs and the nursing notes.  Pertinent labs & imaging results that were available during my care of the patient were reviewed by me and considered in my medical decision making (see chart for details).  Patient seen and examined. Work-up initiated.   Vital signs reviewed and are as follows: BP 119/75 (BP Location: Right Arm)   Pulse (!) 107   Temp 98.4 F (36.9 C) (Oral)    Resp (!) 22   Ht 6\' 1"  (1.854 m)   Wt 75 kg   SpO2 90%   BMI 21.81 kg/m   4:04 PM I spoke with Dr. Baxter Flattery of infectious disease who knows the  patient well.  She would have high concern for pneumocystis pneumonia.  Would start Bactrim, ceftriaxone, azithromycin, steroids.  Creatinine is OK. Will obtain CT to r/o PE as well given c/o leg swelling, chest and back pain, tachycardia and hypoxia --although these symptoms could all be explained by his pneumonia as well.  Patient is currently on 3 L of oxygen by nasal cannula given oxygen level dipping into the 80s at times on room air.  7:13 PM CT with PNA, no PE.   Spoke with family practice. Pt has not seen them for > 3 years so he is not a current patient with them and will need to re-establish care. Will call for unassigned med admit.   7:31 PM Spoke with Dr. Allena Katz of [facility] who will see.   CRITICAL CARE Performed by: Renne Crigler Total critical care time: 40 minutes Critical care time was exclusive of separately billable procedures and treating other patients. Critical care was necessary to treat or prevent imminent or life-threatening deterioration. Critical care was time spent personally by me on the following activities: development of treatment plan with patient and/or surrogate as well as nursing, discussions with consultants, evaluation of patient's response to treatment, examination of patient, obtaining history from patient or surrogate, ordering and performing treatments and interventions, ordering and review of laboratory studies, ordering and review of radiographic studies, pulse oximetry and re-evaluation of patient's condition.      MDM Rules/Calculators/A&P                     Admit for PNA, possible pneumocystis pneumonia.   Final Clinical Impression(s) / ED Diagnoses Final diagnoses:  Multifocal pneumonia  Acute respiratory failure with hypoxia [facility])    Rx / DC Orders ED Discharge Orders    None         Renne Crigler, PA-C 05/17/19 1934    Geoffery Lyons, MD 05/17/19 1942

## 2019-05-17 NOTE — H&P (Signed)
History and Physical    [name] HYQ:[id number] DOB: 1970-07-20 DOA: 05/17/2019  PCP: Dickie La, MD  Patient coming from: Home  I have personally briefly reviewed patient's old medical records in [place]  Chief Complaint: Shortness of breath  HPI: [name] is a 49 y.o. male with medical history significant for HIV (08/22/2018 CD4 11, RNA 134), history of TB, s/p perianal excision and colostomy September 2020, and type 2 diabetes who presents to the ED for evaluation of dyspnea.  Patient has been having approximately 1 month of progressively worsening dry cough and shortness of breath with bilateral lower chest pain.  He has been having intermittent fevers, chills, and lower extremity swelling.  He says he has not been taking his antiretroviral medications for about 6 months now.  ED Course:  Initial vitals showed BP 116/73, pulse 124, RR 16, temp 99.9 Fahrenheit, SPO2 94% on room air.  SPO2 decreased to 90% and patient was placed on 3 L supplemental O2 via [place].  Labs are notable for sodium 132, potassium 3.2, bicarb 20, BUN 15, creatinine 1.25, serum glucose 151, AST 24, ALT 13, alk phos 61, total bilirubin 0.8, WBC 9.9, hemoglobin 8.4, platelets 219,000, lactic acid 1.1.  Urinalysis is negative for UTI.  Blood cultures were obtained and pending.  POC SARS-CoV-2 antigen test is negative.  SARS-CoV-2 PCR test is collected and pending.  CD4, HIV RNA, Legionella urinary antigen, expectorated sputum and pneumocystis smear are collected and pending.  Portable chest x-ray shows extensive left-sided airspace disease with emphysematous lung changes.  CTA chest PE study is negative for acute PE.  Extensive left upper lobe consolidation is noted as well as chronic lung changes bilaterally with moderate emphysema and extensive bronchiectasis with mucous plugging in the left lower lobe.  EDP discussed the case with on-call infectious disease who recommended treating for  suspected pneumocystis pneumonia.  Patient was given IV ceftriaxone, azithromycin, and started on IV Bactrim.  Patient was also given prednisone 40 mg orally once.  The hospitalist service was consulted admit for further evaluation management.  Review of Systems: All systems reviewed and are negative except as documented in history of present illness above.   Past Medical History:  Diagnosis Date  . Depression    "stress and depression for any man is common" (03/21/2014)  . Genital warts 01/04/2017  . Hepatitis    "I don't know what hepatitis I have"  . HIV disease ([place])   . TB (pulmonary tuberculosis)    previously treated according to refugee documentation    Past Surgical History:  Procedure Laterality Date  . FRACTURE SURGERY    . IM NAILING TIBIA Right 03/21/2014  . ORIF ANKLE FRACTURE Right 03/21/2014   lateral malleolus/notes 03/21/2014  . ORIF ANKLE FRACTURE Right 03/21/2014   Procedure: OPEN REDUCTION INTERNAL FIXATION (ORIF) pilon ;  Surgeon: Marybelle Killings, MD;  Location: [facility];  Service: Orthopedics;  Laterality: Right;  . TIBIA IM NAIL INSERTION Right 03/21/2014   Procedure: INTRAMEDULLARY (IM) NAIL TIBIAL;  Surgeon: Marybelle Killings, MD;  Location: [facility];  Service: Orthopedics;  Laterality: Right;  Marland Kitchen VIDEO BRONCHOSCOPY Bilateral 03/02/2016   Procedure: VIDEO BRONCHOSCOPY WITHOUT FLUORO;  Surgeon: Javier Glazier, MD;  Location: [facility];  Service: Cardiopulmonary;  Laterality: Bilateral;    Social History:  reports that he quit smoking about 18 years ago. His smoking use included cigarettes. He has a 3.00 pack-year smoking history. He has never used smokeless tobacco.  He reports current alcohol use. He reports that he does not use drugs.  No Known Allergies  Family History  Problem Relation Age of Onset  . Hypertension Other   . Heart disease Sister      Prior to Admission medications   Medication Sig Start Date End Date Taking? Authorizing Provider    Darunavir-Cobicisctat-Emtricitabine-Tenofovir Alafenamide (SYMTUZA) 800-150-200-10 MG TABS Take 1 tablet by mouth daily with breakfast. Patient not taking: Reported on 05/17/2019 06/06/18   Kuppelweiser, Cassie L, RPH-CPP  dolutegravir (TIVICAY) 50 MG tablet Take 1 tablet (50 mg total) by mouth daily. Patient not taking: Reported on 05/17/2019 06/06/18   Kuppelweiser, Cassie L, RPH-CPP  glipiZIDE (GLUCOTROL) 10 MG tablet Take 1 tablet (10 mg total) by mouth daily before breakfast. Patient not taking: Reported on 05/17/2019 11/15/18 05/17/19  Darliss Cheney, MD  lidocaine (XYLOCAINE) 5 % ointment Apply 1 application topically 3 (three) times daily as needed. Apply over genital area for pain relief. Patient not taking: Reported on 05/17/2019 04/26/18   Fatima Blank, MD  sitaGLIPtin-metformin (JANUMET) 50-500 MG tablet Take 1 tablet by mouth 2 (two) times daily with a meal. Patient not taking: Reported on 05/17/2019 11/15/18 05/17/19  Darliss Cheney, MD    Physical Exam: Vitals:   05/17/19 2115 05/17/19 2223 05/17/19 2230 05/17/19 2300  BP: 109/74 118/77 105/77 104/77  Pulse: 87 97 87 91  Resp: 19 (!) '21 18 20  ' Temp:      TempSrc:      SpO2: 98% 95% 97% 99%  Weight:      Height:        Constitutional: Resting supine in bed, NAD, calm, comfortable Eyes: PERRL, lids and conjunctivae normal ENMT: Mucous membranes are moist. Posterior pharynx clear of any exudate or lesions.Normal dentition.  Neck: normal, supple, no masses. Respiratory: End expiratory wheezing bilaterally slight inspiratory crackles left upper lung field. Normal respiratory effort. No accessory muscle use.  Cardiovascular: Regular rate and rhythm, no murmurs / rubs / gallops. No extremity edema. 2+ pedal pulses. Abdomen: no tenderness, no masses palpated. No hepatosplenomegaly. Bowel sounds positive.  Musculoskeletal: no clubbing / cyanosis. No joint deformity upper and lower extremities. Good ROM, no contractures. Normal muscle tone.   Skin: no rashes, lesions, ulcers. No induration Neurologic: CN 2-12 grossly intact. Sensation intact, Strength 5/5 in all 4.  Psychiatric: Normal mood.     Labs on Admission: I have personally reviewed following labs and imaging studies  CBC: Recent Labs  Lab 05/17/19 1203  WBC 9.9  NEUTROABS 5.6  HGB 8.4*  HCT 28.5*  MCV 82.4  PLT 295   Basic Metabolic Panel: Recent Labs  Lab 05/17/19 1203  NA 132*  K 3.2*  CL 100  CO2 23  GLUCOSE 151*  BUN 15  CREATININE 1.25*  CALCIUM 8.7*   GFR: Estimated Creatinine Clearance: 76.7 mL/min (A) (by C-G formula based on SCr of 1.25 mg/dL (H)). Liver Function Tests: Recent Labs  Lab 05/17/19 1203  AST 24  ALT 13  ALKPHOS 61  BILITOT 0.8  PROT 9.1*  ALBUMIN 2.5*   No results for input(s): LIPASE, AMYLASE in the last 168 hours. No results for input(s): AMMONIA in the last 168 hours. Coagulation Profile: Recent Labs  Lab 05/17/19 1203  INR 1.1   Cardiac Enzymes: No results for input(s): CKTOTAL, CKMB, CKMBINDEX, TROPONINI in the last 168 hours. BNP (last 3 results) No results for input(s): PROBNP in the last 8760 hours. HbA1C: Recent Labs    05/17/19 1203  HGBA1C 5.4   CBG: Recent Labs  Lab 05/17/19 2215  GLUCAP 191*   Lipid Profile: No results for input(s): CHOL, HDL, LDLCALC, TRIG, CHOLHDL, LDLDIRECT in the last 72 hours. Thyroid Function Tests: No results for input(s): TSH, T4TOTAL, FREET4, T3FREE, THYROIDAB in the last 72 hours. Anemia Panel: No results for input(s): VITAMINB12, FOLATE, FERRITIN, TIBC, IRON, RETICCTPCT in the last 72 hours. Urine analysis:    Component Value Date/Time   COLORURINE YELLOW 05/17/2019 1556   APPEARANCEUR CLEAR 05/17/2019 1556   LABSPEC 1.020 05/17/2019 1556   PHURINE 6.0 05/17/2019 1556   GLUCOSEU NEGATIVE 05/17/2019 1556   HGBUR NEGATIVE 05/17/2019 1556   BILIRUBINUR NEGATIVE 05/17/2019 1556   KETONESUR NEGATIVE 05/17/2019 1556   PROTEINUR 30 (A) 05/17/2019 1556    UROBILINOGEN 1.0 03/23/2014 1400   NITRITE NEGATIVE 05/17/2019 1556   LEUKOCYTESUR NEGATIVE 05/17/2019 1556    Radiological Exams on Admission: CT Angio Chest PE W and/or Wo Contrast  Result Date: 05/17/2019 CLINICAL DATA:  Shortness of breath. EXAM: CT ANGIOGRAPHY CHEST WITH CONTRAST TECHNIQUE: Multidetector CT imaging of the chest was performed using the standard protocol during bolus administration of intravenous contrast. Multiplanar CT image reconstructions and MIPs were obtained to evaluate the vascular anatomy. CONTRAST:  60m OMNIPAQUE IOHEXOL 350 MG/ML SOLN COMPARISON:  None. FINDINGS: Cardiovascular: Evaluation for pulmonary emboli at the subsegmental level is limited by respiratory motion artifact.There is no pulmonary embolus. The main pulmonary artery is within normal limits for size. There is no CT evidence of acute right heart strain. The visualized aorta is normal. Heart size is normal, without pericardial effusion. Mediastinum/Nodes: --mild mediastinal adenopathy is noted. --No axillary lymphadenopathy. --No supraclavicular lymphadenopathy. --Normal thyroid gland. --the esophagus is unremarkable. Lungs/Pleura: Extensive left upper lobe airspace consolidation is noted. Emphysema is noted. Bronchiectasis is noted at the lung bases bilaterally with areas of bronchial wall thickening and mucus plugging. There is no pneumothorax. No significant pleural effusion. Upper Abdomen: No acute abnormality. Musculoskeletal: No chest wall abnormality. No acute or significant osseous findings. Review of the MIP images confirms the above findings. IMPRESSION: 1. No acute pulmonary embolism. 2. Extensive left upper lobe consolidation concerning for pneumonia. 3. Again noted are chronic lung changes bilaterally with moderate emphysema and extensive bronchiectasis with mucous plugging and bronchial wall thickening in the left lower lobe. Consider outpatient pulmonary medicine follow-up for further evaluation.  4. Mediastinal adenopathy is again noted and is favored to be reactive. Electronically Signed   By: CConstance HolsterM.D.   On: 05/17/2019 18:13   DG Chest Portable 1 View  Result Date: 05/17/2019 CLINICAL DATA:  Chest pain and shortness of breath for 1 month. Fever for 1 week. EXAM: PORTABLE CHEST 1 VIEW COMPARISON:  Single-view of the chest 11/10/2018. FINDINGS: The lungs are severely emphysematous. There is airspace disease throughout the left mid and upper lung zone consistent with pneumonia. Streaky opacities in the left base are suggestive of scar and unchanged. No pneumothorax. Trace left pleural effusion. Heart size normal. IMPRESSION: Extensive airspace disease in the left chest consistent with pneumonia. Recommend follow-up to clearing. Emphysema. Electronically Signed   By: TInge RiseM.D.   On: 05/17/2019 14:50    EKG: Independently reviewed. Sinus tachycardia, low voltage in lead I, V1-V2.  Not significantly changed when compared to prior.  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  Acute respiratory failure with hypoxia due to pneumonia, presumed pneumocystis pneumonia: Continue community-acquired pneumonia as well as pneumocystis pneumonia coverage as he is high risk for opportunistic infections. -Continue IV ceftriaxone/azithromycin -Continue IV Bactrim -Continue oral prednisone 40 mg twice daily -Continue supplemental oxygen as needed and wean off as able  HIV: 08/22/2018 CD4 11, RNA 134.  Repeat CD4 and RNA  pending.  Patient reports being off antiretroviral medications for at least 6 months now.  Will need ID input on timing of reinitiation of antiretrovirals.  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 Morton Stall at [facility].  Appears to be stable.  Continue ostomy care.  Type 2 diabetes: Patient not taking home Janumet and glipizide per report.  Continue sensitive SSI while in hospital.  Anemia: Likely anemia of chronic disease.  Currently stable without obvious bleeding.  Check anemia labs.  DVT prophylaxis: Lovenox Code Status: Full code Family Communication: Discussed with patient Disposition Plan: From home, discharge pending continued IV antibiotic therapy and ability to transition to oral antibiotics and wean off supplemental oxygen. Consults called: EDP discussed with on-call infectious disease Admission status:  Admit - It is my clinical opinion that admission to INPATIENT is reasonable and necessary because of the expectation that this patient will require hospital care that crosses at least 2 midnights to treat this condition based on the medical complexity of the problems presented.  Given the aforementioned information, the predictability of an adverse outcome is felt to be significant.    Zada Finders MD [facility]  If 7PM-7AM, please contact night-coverage [email or web address]  05/18/2019, 12:05 AM

## 2019-05-17 NOTE — ED Notes (Signed)
Pt. Noted with labored breathing following non-productive coughing spell, Spo2 88%-89% on room air. Pt. Placed on 02 via [place] at 3Lpm. EDP notified

## 2019-05-17 NOTE — Progress Notes (Signed)
Pharmacy Antibiotic Note  [name] is a 49 y.o. male admitted on 05/17/2019 with suspected Pneumocystis Pneumonia (PCP).  Pharmacy has been consulted for Bactrim dosing.  HIv viral load and CD4 count has been collected and is in progress. WBC is 9.9 and pt is afebrile. CXR today revealed " Extensive airspace disease in the left chest consistent with Pneumonia."  Plan: Bactrim 15mg /kg/day divided into 3 daily doses Bactrim 375mg  IV Q8h F/u smear results, CD4, viral load, and ID recs  Height: 6\' 1"  (185.4 cm) Weight: 165 lb 5.5 oz (75 kg) IBW/kg (Calculated) : 79.9  Temp (24hrs), Avg:99 F (37.2 C), Min:98.4 F (36.9 C), Max:99.9 F (37.7 C)  Recent Labs  Lab 05/17/19 1203 05/17/19 1204 05/17/19 1545  WBC 9.9  --   --   CREATININE 1.25*  --   --   LATICACIDVEN  --  1.1 1.3    Estimated Creatinine Clearance: 76.7 mL/min (A) (by C-G formula based on SCr of 1.25 mg/dL (H)).    No Known Allergies  Antimicrobials this admission: 3/3 ceftriaxone >>  3/3 azithromycin >>  3/3 bactrim >>  Dose adjustments this admission: N/A  Microbiology results: 3/3 BCx: ngtd 3/3 Sputum: sent 3/3 Pneumocystis smear: sent  Thank you for allowing pharmacy to be a part of this patient's care.  07/17/19, PharmD PGY1 Ambulatory Care Pharmacy Resident 05/17/2019 5:02 PM

## 2019-05-17 NOTE — ED Triage Notes (Signed)
Pt arrives to ED from home with complaints of generalized chest pain, shortness of breath, and lower back pain x1 month. Patient states over the last week he has developed fevers and chills. Patient states he is now so SOB that he cannot lay flat to sleep. Patient tachycardic in triage. Triage obtained through interpreter services.

## 2019-05-18 ENCOUNTER — Encounter (HOSPITAL_COMMUNITY): Payer: Self-pay | Admitting: Internal Medicine

## 2019-05-18 DIAGNOSIS — J439 Emphysema, unspecified: Secondary | ICD-10-CM

## 2019-05-18 DIAGNOSIS — B20 Human immunodeficiency virus [HIV] disease: Principal | ICD-10-CM

## 2019-05-18 DIAGNOSIS — Z87891 Personal history of nicotine dependence: Secondary | ICD-10-CM

## 2019-05-18 DIAGNOSIS — Z8719 Personal history of other diseases of the digestive system: Secondary | ICD-10-CM

## 2019-05-18 DIAGNOSIS — Z9889 Other specified postprocedural states: Secondary | ICD-10-CM

## 2019-05-18 DIAGNOSIS — J189 Pneumonia, unspecified organism: Secondary | ICD-10-CM

## 2019-05-18 DIAGNOSIS — Z933 Colostomy status: Secondary | ICD-10-CM

## 2019-05-18 DIAGNOSIS — E119 Type 2 diabetes mellitus without complications: Secondary | ICD-10-CM

## 2019-05-18 LAB — IRON AND TIBC
Iron: 20 ug/dL — ABNORMAL LOW (ref 45–182)
Saturation Ratios: 6 % — ABNORMAL LOW (ref 17.9–39.5)
TIBC: 308 ug/dL (ref 250–450)
UIBC: 288 ug/dL

## 2019-05-18 LAB — BASIC METABOLIC PANEL
Anion gap: 7 (ref 5–15)
BUN: 13 mg/dL (ref 6–20)
CO2: 24 mmol/L (ref 22–32)
Calcium: 8.6 mg/dL — ABNORMAL LOW (ref 8.9–10.3)
Chloride: 105 mmol/L (ref 98–111)
Creatinine, Ser: 1.12 mg/dL (ref 0.61–1.24)
GFR calc Af Amer: >60 mL/min (ref >60)
GFR calc non Af Amer: >60 mL/min (ref >60)
Glucose, Bld: 169 mg/dL — ABNORMAL HIGH (ref 70–99)
Potassium: 4.5 mmol/L (ref 3.5–5.1)
Sodium: 136 mmol/L (ref 135–145)

## 2019-05-18 LAB — FOLATE: Folate: 12.5 ng/mL (ref >5.9)

## 2019-05-18 LAB — CBC
HCT: 28.5 % — ABNORMAL LOW (ref 39.0–52.0)
Hemoglobin: 8.1 g/dL — ABNORMAL LOW (ref 13.0–17.0)
MCH: 24.3 pg — ABNORMAL LOW (ref 26.0–34.0)
MCHC: 28.4 g/dL — ABNORMAL LOW (ref 30.0–36.0)
MCV: 85.3 fL (ref 80.0–100.0)
Platelets: 213 10*3/uL (ref 150–400)
RBC: 3.34 MIL/uL — ABNORMAL LOW (ref 4.22–5.81)
RDW: 25.3 % — ABNORMAL HIGH (ref 11.5–15.5)
WBC: 7.2 10*3/uL (ref 4.0–10.5)
nRBC: 0 % (ref 0.0–0.2)

## 2019-05-18 LAB — CBG MONITORING, ED: Glucose-Capillary: 186 mg/dL — ABNORMAL HIGH (ref 70–99)

## 2019-05-18 LAB — T-HELPER CELLS (CD4) COUNT (NOT AT ARMC)
CD4 % Helper T Cell: 6 % — ABNORMAL LOW (ref 33–65)
CD4 T Cell Abs: 64 /uL — ABNORMAL LOW (ref 400–1790)

## 2019-05-18 LAB — VITAMIN B12: Vitamin B-12: 607 pg/mL (ref 180–914)

## 2019-05-18 LAB — FERRITIN: Ferritin: 47 ng/mL (ref 24–336)

## 2019-05-18 LAB — GLUCOSE, CAPILLARY
Glucose-Capillary: 160 mg/dL — ABNORMAL HIGH (ref 70–99)
Glucose-Capillary: 182 mg/dL — ABNORMAL HIGH (ref 70–99)

## 2019-05-18 LAB — STREP PNEUMONIAE URINARY ANTIGEN: Strep Pneumo Urinary Antigen: NEGATIVE

## 2019-05-18 MED ORDER — FERROUS SULFATE 325 (65 FE) MG PO TABS
325.0000 mg | ORAL_TABLET | Freq: Every day | ORAL | Status: DC
  Administered 2019-05-19 – 2019-05-26 (×8): 325 mg via ORAL
  Filled 2019-05-18 (×9): qty 1

## 2019-05-18 MED ORDER — SODIUM CHLORIDE 3 % IN NEBU
4.0000 mL | INHALATION_SOLUTION | Freq: Two times a day (BID) | RESPIRATORY_TRACT | Status: DC
  Filled 2019-05-18: qty 4

## 2019-05-18 MED ORDER — SODIUM CHLORIDE 3 % IN NEBU
4.0000 mL | INHALATION_SOLUTION | Freq: Two times a day (BID) | RESPIRATORY_TRACT | Status: AC
  Administered 2019-05-18 – 2019-05-20 (×5): 4 mL via RESPIRATORY_TRACT
  Filled 2019-05-18 (×5): qty 4

## 2019-05-18 NOTE — Consult Note (Signed)
WOC Nurse ostomy consult note Stoma type/location: LLQ colostomy Stomal assessment/size: 1 3/8" well budded pink and moist   Peristomal assessment: intact Treatment options for stomal/peristomal skin: barrier ring and 1 piece flat pouch Output soft brown stool Ostomy pouching: 1pc. Flat with barrier ring  Education provided: Patient has help at home with ostomy care.  I changed out pouch today.   Enrolled patient in [employer] DC program: Previously set up with supplies from [facility].  Will not follow at this time.  Please re-consult if needed.  Maple Hudson MSN, RN, FNP-BC CWON Wound, Ostomy, Continence Nurse Pager [phone]

## 2019-05-18 NOTE — Progress Notes (Signed)
PROGRESS NOTE  [name]:[id number] DOB: 1971-02-27 DOA: 05/17/2019 PCP: [name], MD  HPI/Recap of past 24 hours: HPI from Dr. Alinda Deem [name] is a 49 y.o. 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 Patient has been having approximately 1 month of progressively worsening dry cough and shortness of breath with bilateral lower chest pain.  He has been having intermittent fevers, chills, and lower extremity swelling.  He says he has not been taking his antiretroviral medications for about 6 months now. In the ED, BP 116/73, pulse 124, RR 16, temp 99.9 Fahrenheit, SPO2 94% on room air.  SPO2 decreased to 90% and patient was placed on 3 L supplemental O2 via . Labs are notable for BUN 15, creatinine 1.25, WBC 9.9, hemoglobin 8.4. COVID-19 negative. CTA chest PE study is negative for acute PE. Extensive left upper lobe consolidation is noted as well as chronic lung changes bilaterally with moderate emphysema and extensive bronchiectasis with mucous plugging in the left lower lobe. EDP discussed the case with on-call infectious disease who recommended treating for suspected pneumocystis pneumonia.  Patient was given IV ceftriaxone, azithromycin, and started on IV Bactrim.  Patient was also given prednisone 40 mg orally once.  The hospitalist service was consulted admit for further evaluation management.     Today patient reports still feeling the same, still with nonproductive cough, bilateral lower chest pain, chills.  Patient reported he usually gets his medications in the mail, but suddenly stopped receiving them.  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 Extensive left upper lobe consolidation is noted as well as chronic lung changes bilaterally with moderate emphysema and extensive bronchiectasis with mucous plugging in the left lower lobe ID on board, appreciate recs Continue IV ceftriaxone/azithromycin, IV Bactrim Continue oral prednisone 40 mg twice daily Continue supplemental oxygen as needed and wean off as able  HIV Noncompliant with antiretroviral meds Repeat CD4-->6/64, RNA pending ID on board, appreciate recs  Anemia of chronic disease (HIV) Hemoglobin around baseline Anemia panel showed iron 20, sats 6, ferritin 47, folate 12.5, vitamin B12 607 Start oral iron supplementation Daily CBC  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 Byrd Hesselbach at [facility] Continue ostomy care  Type 2 diabetes mellitus A1c 5.4 SSI, Accu-Cheks, hypoglycemic protocol Patient not taking home Janumet and glipizide per report         Malnutrition Type:      Malnutrition Characteristics:      Nutrition Interventions:       Estimated body mass index is 21.81 kg/m as calculated from the following:   Height as of this encounter: 6\' 1"  (1.854 m).   Weight as of this encounter: 75 kg.     Code Status: Full  Family Communication: Plan to update family  Disposition Plan: Likely home, still requiring oxygen,  IV antibiotics and further work-up by ID   Consultants:  ID  Procedures:  None  Antimicrobials:  Ceftriaxone  Azithromycin  Bactrim  DVT prophylaxis: Lovenox   Objective: Vitals:    05/18/19 0900 05/18/19 0930 05/18/19 1000 05/18/19 1224  BP: 113/79 116/80 117/81 116/87  Pulse: 86 84 82 80  Resp: 17 19 18 18   Temp:      TempSrc:      SpO2: 98% 99% 98% 100%  Weight:      Height:        Intake/Output Summary (Last 24 hours) at 05/18/2019 1237 Last data filed at 05/18/2019 0500 Gross per 24 hour  Intake --  Output 600 ml  Net -600 ml   Filed Weights   05/17/19 1444  Weight: 75 kg    Exam:  General: NAD   Cardiovascular: S1, S2 present  Respiratory:  Diminished breath sounds bilaterally  Abdomen: Soft, nontender, nondistended, bowel sounds present  Musculoskeletal: No bilateral pedal edema noted  Skin: Normal  Psychiatry: Normal mood   Data Reviewed: CBC: Recent Labs  Lab 05/17/19 1203 05/18/19 0500  WBC 9.9 7.2  NEUTROABS 5.6  --   HGB 8.4* 8.1*  HCT 28.5* 28.5*  MCV 82.4 85.3  PLT 219 213   Basic Metabolic Panel: Recent Labs  Lab 05/17/19 1203 05/18/19 0500  NA 132* 136  K 3.2* 4.5  CL 100 105  CO2 23 24  GLUCOSE 151* 169*  BUN 15 13  CREATININE 1.25* 1.12  CALCIUM 8.7* 8.6*   GFR: Estimated Creatinine Clearance: 85.6 mL/min (by C-G formula based on SCr of 1.12 mg/dL). Liver Function Tests: Recent Labs  Lab 05/17/19 1203  AST 24  ALT 13  ALKPHOS 61  BILITOT 0.8  PROT 9.1*  ALBUMIN 2.5*   No results for input(s): LIPASE, AMYLASE in the last 168 hours. No results for input(s): AMMONIA in the last 168 hours. Coagulation Profile: Recent Labs  Lab 05/17/19 1203  INR 1.1   Cardiac Enzymes: No results for input(s): CKTOTAL, CKMB, CKMBINDEX, TROPONINI in the last 168 hours. BNP (last 3 results) No results for input(s): PROBNP in the last 8760 hours. HbA1C: Recent Labs    05/17/19 1203  HGBA1C 5.4   CBG: Recent Labs  Lab 05/17/19 2215 05/18/19 0724  GLUCAP 191* 186*   Lipid Profile: No results for input(s): CHOL, HDL, LDLCALC, TRIG, CHOLHDL, LDLDIRECT in the last 72 hours. Thyroid Function Tests: No  results for input(s): TSH, T4TOTAL, FREET4, T3FREE, THYROIDAB in the last 72 hours. Anemia Panel: Recent Labs    05/18/19 0500  VITAMINB12 607  FOLATE 12.5  FERRITIN 47  TIBC 308  IRON 20*   Urine analysis:    Component Value Date/Time   COLORURINE YELLOW 05/17/2019 1556   APPEARANCEUR CLEAR 05/17/2019 1556   LABSPEC 1.020 05/17/2019 1556   PHURINE 6.0 05/17/2019 1556   GLUCOSEU NEGATIVE 05/17/2019 1556   HGBUR NEGATIVE 05/17/2019 1556   BILIRUBINUR NEGATIVE 05/17/2019 1556   KETONESUR NEGATIVE 05/17/2019 1556   PROTEINUR 30 (A) 05/17/2019 1556   UROBILINOGEN 1.0 03/23/2014 1400   NITRITE NEGATIVE 05/17/2019 1556   LEUKOCYTESUR NEGATIVE 05/17/2019 1556   Sepsis Labs: @LABRCNTIP (procalcitonin:4,lacticidven:4)  ) Recent Results (from the past 240 hour(s))  Culture, blood (Routine x 2)     Status: None (Preliminary result)   Collection Time: 05/17/19 12:23 PM   Specimen: BLOOD  Result Value Ref Range Status   Specimen Description BLOOD RIGHT ANTECUBITAL  Final   Special Requests  Final    BOTTLES DRAWN AEROBIC AND ANAEROBIC Blood Culture adequate volume   Culture   Final    NO GROWTH < 24 HOURS Performed at [facility], [address] [place], [place] [address]    Report Status PENDING  Incomplete  Culture, blood (Routine x 2)     Status: None (Preliminary result)   Collection Time: 05/17/19  3:45 PM   Specimen: BLOOD RIGHT ARM  Result Value Ref Range Status   Specimen Description BLOOD RIGHT ARM  Final   Special Requests   Final    BOTTLES DRAWN AEROBIC AND ANAEROBIC Blood Culture results may not be optimal due to an inadequate volume of blood received in culture bottles   Culture  Setup Time   Final    GRAM POSITIVE COCCI IN CLUSTERS ANAEROBIC BOTTLE ONLY CRITICAL RESULT CALLED TO, READ BACK BY AND VERIFIED WITH: J. STEENWYK, PHARMD AT 0830 ON 05/18/19 BY C. JESSUP, MT.    Culture   Final    NO GROWTH < 24 HOURS Performed at [facility],  [address] [place], [place] [address]    Report Status PENDING  Incomplete  SARS CORONAVIRUS 2 (TAT 6-24 HRS) Nasopharyngeal Nasopharyngeal Swab     Status: None   Collection Time: 05/17/19  7:48 PM   Specimen: Nasopharyngeal Swab  Result Value Ref Range Status   SARS Coronavirus 2 NEGATIVE NEGATIVE Final    Comment: (NOTE) SARS-CoV-2 target nucleic acids are NOT DETECTED. The SARS-CoV-2 RNA is generally detectable in upper and lower respiratory specimens during the acute phase of infection. Negative results do not preclude SARS-CoV-2 infection, do not rule out co-infections with other pathogens, and should not be used as the sole basis for treatment or other patient management decisions. Negative results must be combined with clinical observations, patient history, and epidemiological information. The expected result is Negative. Fact Sheet for Patients: HairSlick.no Fact Sheet for Healthcare Providers: quierodirigir.com This test is not yet approved or cleared by the [place] FDA and  has been authorized for detection and/or diagnosis of SARS-CoV-2 by FDA under an Emergency Use Authorization (EUA). This EUA will remain  in effect (meaning this test can be used) for the duration of the COVID-19 declaration under Section 56 4(b)(1) of the Act, 21 U.S.C. section 360bbb-3(b)(1), unless the authorization is terminated or revoked sooner. Performed at [facility], [address] [place], [place] [address]   Expectorated sputum assessment w rflx to resp cult     Status: None   Collection Time: 05/17/19  7:57 PM   Specimen: Expectorated Sputum  Result Value Ref Range Status   Specimen Description EXPECTORATED SPUTUM  Final   Special Requests NONE  Final   Sputum evaluation   Final    Sputum specimen not acceptable for testing.  Please recollect.   RESULT CALLED TO, READ BACK BY AND VERIFIED WITH: Jerelyn Scott RN 05/17/19 2112  JDW Performed at [facility], [address] [place], [place] [address]    Report Status 05/17/2019 FINAL  Final      Studies: CT Angio Chest PE W and/or Wo Contrast  Result Date: 05/17/2019 CLINICAL DATA:  Shortness of breath. EXAM: CT ANGIOGRAPHY CHEST WITH CONTRAST TECHNIQUE: Multidetector CT imaging of the chest was performed using the standard protocol during bolus administration of intravenous contrast. Multiplanar CT image reconstructions and MIPs were obtained to evaluate the vascular anatomy. CONTRAST:  67mL OMNIPAQUE IOHEXOL 350 MG/ML SOLN COMPARISON:  None.  FINDINGS: Cardiovascular: Evaluation for pulmonary emboli at the subsegmental level is limited by respiratory motion artifact.There is no pulmonary embolus. The main pulmonary artery is within normal limits for size. There is no CT evidence of acute right heart strain. The visualized aorta is normal. Heart size is normal, without pericardial effusion. Mediastinum/Nodes: --mild mediastinal adenopathy is noted. --No axillary lymphadenopathy. --No supraclavicular lymphadenopathy. --Normal thyroid gland. --the esophagus is unremarkable. Lungs/Pleura: Extensive left upper lobe airspace consolidation is noted. Emphysema is noted. Bronchiectasis is noted at the lung bases bilaterally with areas of bronchial wall thickening and mucus plugging. There is no pneumothorax. No significant pleural effusion. Upper Abdomen: No acute abnormality. Musculoskeletal: No chest wall abnormality. No acute or significant osseous findings. Review of the MIP images confirms the above findings. IMPRESSION: 1. No acute pulmonary embolism. 2. Extensive left upper lobe consolidation concerning for pneumonia. 3. Again noted are chronic lung changes bilaterally with moderate emphysema and extensive bronchiectasis with mucous plugging and bronchial wall thickening in the left lower lobe. Consider outpatient pulmonary medicine follow-up for further evaluation. 4.  Mediastinal adenopathy is again noted and is favored to be reactive. Electronically Signed   By: Constance Holster M.D.   On: 05/17/2019 18:13   DG Chest Portable 1 View  Result Date: 05/17/2019 CLINICAL DATA:  Chest pain and shortness of breath for 1 month. Fever for 1 week. EXAM: PORTABLE CHEST 1 VIEW COMPARISON:  Single-view of the chest 11/10/2018. FINDINGS: The lungs are severely emphysematous. There is airspace disease throughout the left mid and upper lung zone consistent with pneumonia. Streaky opacities in the left base are suggestive of scar and unchanged. No pneumothorax. Trace left pleural effusion. Heart size normal. IMPRESSION: Extensive airspace disease in the left chest consistent with pneumonia. Recommend follow-up to clearing. Emphysema. Electronically Signed   By: Inge Rise M.D.   On: 05/17/2019 14:50    Scheduled Meds: . enoxaparin (LOVENOX) injection  40 mg Subcutaneous Q24H  . guaiFENesin  600 mg Oral BID  . insulin aspart  0-9 Units Subcutaneous TID WC  . predniSONE  40 mg Oral BID WC    Continuous Infusions: . azithromycin    . cefTRIAXone (ROCEPHIN)  IV    . sulfamethoxazole-trimethoprim Stopped (05/18/19 0819)     LOS: 1 day     Alma Friendly, MD [facility]  If 7PM-7AM, please contact night-coverage [email or web address] 05/18/2019, 12:37 PM

## 2019-05-18 NOTE — Progress Notes (Signed)
NEW ADMISSION NOTE New Admission Note: From ED  Arrival Method: Stretcher  Mental Orientation: Alert Telemetry: Assessment: Completed Skin: IV: Patent Pain: Tubes: Safety Measures: Safety Fall Prevention Plan has been given, discussed and signed Admission: Completed [facility] Orientation: Patient has been orientated to the room, unit and staff.  Family:  Orders have been reviewed and implemented. Will continue to monitor the patient. Call light has been placed within reach and bed alarm has been activated.   Shanna Cisco, RN

## 2019-05-18 NOTE — ED Notes (Signed)
Breakfast tray delivered to patient

## 2019-05-18 NOTE — Consult Note (Signed)
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 He mentioned that pharmacy stopped delivering his medication and has not been back to HIV clinic. The patient presented to the ED on 3/4 with 3-5 days with worsening dyspnea on exertion. On admit, he is afebrile, WBC of 7.2, cxr shows signs of emphysema with left upper lobe infiltrate.  Past Medical History:  Diagnosis Date  . Depression    "stress and depression for any man is common" (03/21/2014)  . Genital warts 01/04/2017  . Hepatitis    "I don't know what hepatitis I have"  . HIV disease ([place])   . TB (pulmonary tuberculosis)    previously treated according to refugee documentation    Allergies: No Known Allergies     MEDICATIONS: . enoxaparin (LOVENOX) injection  40 mg Subcutaneous Q24H  . [START ON 05/19/2019] ferrous sulfate  325 mg Oral Q breakfast  . guaiFENesin  600 mg Oral BID  . insulin aspart  0-9 Units Subcutaneous TID WC  . predniSONE  40 mg Oral BID WC    Social History   Tobacco Use  . Smoking status: Former Smoker    Packs/day: 0.50    Years: 6.00    Pack years: 3.00    Types: Cigarettes    Quit date: 03/16/2001    Years since quitting: 18.1  . Smokeless tobacco: Never Used  . Tobacco comment: "quit smoking ~ 2003"  Substance Use Topics  . Alcohol use: Yes    Comment: drinks bottled beer intermittently  . Drug  use: No    Family History  Problem Relation Age of Onset  . Hypertension Other   . Heart disease Sister      Review of Systems  Constitutional: Negative for fever, chills, diaphoresis, activity change, appetite change, fatigue and unexpected weight change.  HENT: Negative for congestion, sore throat, rhinorrhea, sneezing, trouble swallowing and sinus pressure.  Eyes: Negative for photophobia and visual disturbance.  Respiratory: positive for cough, chest tightness, shortness of breath, wheezing and stridor.  Cardiovascular: Negative for chest pain, palpitations and leg swelling.  Gastrointestinal: Negative for nausea, vomiting, abdominal pain, diarrhea, constipation, blood in stool, abdominal distention and anal bleeding.  Genitourinary: Negative for dysuria, hematuria, flank pain and difficulty urinating.  Musculoskeletal: Negative for myalgias, back pain, joint swelling, arthralgias and gait problem.  Skin: Negative for color change, pallor, rash and wound.  Neurological: Negative for dizziness, tremors, weakness and light-headedness.  Hematological: Negative for adenopathy. Does not bruise/bleed easily.  Psychiatric/Behavioral: Negative for behavioral problems, confusion, sleep disturbance, dysphoric mood, decreased concentration and agitation.     OBJECTIVE: Temp:  [97.7 F (36.5 C)-98.7 F (37.1 C)] 98 F (36.7 C) (03/04 1300) Pulse Rate:  [71-108] 80 (03/04 1300) Resp:  [15-23] 18 (03/04 1224) BP: (97-125)/(65-87) 106/73 (03/04 1300) SpO2:  [90 %-100 %] 100 % (  03/04 1300) Weight:  [75 kg] 75 kg (03/03 1444) Physical Exam  Constitutional: He is oriented to person, place, and time. He appears well-developed and well-nourished. No distress.  HENT:  Mouth/Throat: Oropharynx is clear and moist. No oropharyngeal exudate.  Cardiovascular: Normal rate, regular rhythm and normal heart sounds. Exam reveals no gallop and no friction rub.  No murmur heard.  Pulmonary/Chest: Effort  normal and breath sounds normal. No respiratory distress. He has no wheezes.  Abdominal: Soft. Bowel sounds are normal. He exhibits no distension. There is no tenderness. Ostomy noted in LLQ Lymphadenopathy:  He has no cervical adenopathy.  Neurological: He is alert and oriented to person, place, and time.  Skin: Skin is warm and dry. No rash noted. No erythema.  Psychiatric: He has a normal mood and affect. His behavior is normal.     LABS: Results for orders placed or performed during the hospital encounter of 05/17/19 (from the past 48 hour(s))  Comprehensive metabolic panel     Status: Abnormal   Collection Time: 05/17/19 12:03 PM  Result Value Ref Range   Sodium 132 (L) 135 - 145 mmol/L   Potassium 3.2 (L) 3.5 - 5.1 mmol/L   Chloride 100 98 - 111 mmol/L   CO2 23 22 - 32 mmol/L   Glucose, Bld 151 (H) 70 - 99 mg/dL    Comment: Glucose reference range applies only to samples taken after fasting for at least 8 hours.   BUN 15 6 - 20 mg/dL   Creatinine, Ser 1.25 (H) 0.61 - 1.24 mg/dL   Calcium 8.7 (L) 8.9 - 10.3 mg/dL   Total Protein 9.1 (H) 6.5 - 8.1 g/dL   Albumin 2.5 (L) 3.5 - 5.0 g/dL   AST 24 15 - 41 U/L   ALT 13 0 - 44 U/L   Alkaline Phosphatase 61 38 - 126 U/L   Total Bilirubin 0.8 0.3 - 1.2 mg/dL   GFR calc non Af Amer >60 >60 mL/min   GFR calc Af Amer >60 >60 mL/min   Anion gap 9 5 - 15    Comment: Performed at [facility] [address] [place], [place] [address]  CBC with Differential     Status: Abnormal   Collection Time: 05/17/19 12:03 PM  Result Value Ref Range   WBC 9.9 4.0 - 10.5 K/uL   RBC 3.46 (L) 4.22 - 5.81 MIL/uL   Hemoglobin 8.4 (L) 13.0 - 17.0 g/dL   HCT 28.5 (L) 39.0 - 52.0 %   MCV 82.4 80.0 - 100.0 fL   MCH 24.3 (L) 26.0 - 34.0 pg   MCHC 29.5 (L) 30.0 - 36.0 g/dL   RDW 25.2 (H) 11.5 - 15.5 %   Platelets 219 150 - 400 K/uL   nRBC 0.0 0.0 - 0.2 %   Neutrophils Relative % 57 %   Neutro Abs 5.6 1.7 - 7.7 K/uL   Lymphocytes Relative 28 %     Lymphs Abs 2.8 0.7 - 4.0 K/uL   Monocytes Relative 13 %   Monocytes Absolute 1.3 (H) 0.1 - 1.0 K/uL   Eosinophils Relative 1 %   Eosinophils Absolute 0.1 0.0 - 0.5 K/uL   Basophils Relative 0 %   Basophils Absolute 0.0 0.0 - 0.1 K/uL   Immature Granulocytes 1 %   Abs Immature Granulocytes 0.06 0.00 - 0.07 K/uL    Comment: Performed at [facility], [place] [address] [place], [place] [address]  Protime-INR     Status:  None   Collection Time: 05/17/19 12:03 PM  Result Value Ref Range   Prothrombin Time 13.8 11.4 - 15.2 seconds   INR 1.1 0.8 - 1.2    Comment: (NOTE) INR goal varies based on device and disease states. Performed at [facility], [place] [address], [place], [place] [address]   Hemoglobin A1c     Status: None   Collection Time: 05/17/19 12:03 PM  Result Value Ref Range   Hgb A1c MFr Bld 5.4 4.8 - 5.6 %    Comment: (NOTE) Pre diabetes:          5.7%-6.4% Diabetes:              >6.4% Glycemic control for   <7.0% adults with diabetes    Mean Plasma Glucose 108.28 mg/dL    Comment: Performed at [facility] [address], [place], [place] [address]  Lactic acid, plasma     Status: None   Collection Time: 05/17/19 12:04 PM  Result Value Ref Range   Lactic Acid, Venous 1.1 0.5 - 1.9 mmol/L    Comment: Performed at [facility] [address], [place], [place] [address]  Culture, blood (Routine x 2)     Status: None (Preliminary result)   Collection Time: 05/17/19 12:23 PM   Specimen: BLOOD  Result Value Ref Range   Specimen Description BLOOD RIGHT ANTECUBITAL    Special Requests      BOTTLES DRAWN AEROBIC AND ANAEROBIC Blood Culture adequate volume   Culture      NO GROWTH < 24 HOURS Performed at [name], [place] [address], [place], [place] [address]    Report Status PENDING   Lactic acid, plasma     Status: None   Collection Time: 05/17/19  3:45 PM  Result Value Ref Range   Lactic Acid, Venous 1.3 0.5 - 1.9 mmol/L    Comment:  Performed at [facility], [place] [address] [place], [place] [address]  Culture, blood (Routine x 2)     Status: None (Preliminary result)   Collection Time: 05/17/19  3:45 PM   Specimen: BLOOD RIGHT ARM  Result Value Ref Range   Specimen Description BLOOD RIGHT ARM    Special Requests      BOTTLES DRAWN AEROBIC AND ANAEROBIC Blood Culture results may not be optimal due to an inadequate volume of blood received in culture bottles   Culture  Setup Time      GRAM POSITIVE COCCI IN CLUSTERS ANAEROBIC BOTTLE ONLY CRITICAL RESULT CALLED TO, READ BACK BY AND VERIFIED WITH: J. STEENWYK, PHARMD AT 0830 ON 05/18/19 BY C. JESSUP, MT.    Culture      NO GROWTH < 24 HOURS Performed at [facility], [place] [address] [place],  [address]    Report Status PENDING   Urinalysis, Routine w reflex microscopic     Status: Abnormal   Collection Time: 05/17/19  3:56 PM  Result Value Ref Range   Color, Urine YELLOW YELLOW   APPearance CLEAR CLEAR   Specific Gravity, Urine 1.020 1.005 - 1.030   pH 6.0 5.0 - 8.0   Glucose, UA NEGATIVE NEGATIVE mg/dL   Hgb urine dipstick NEGATIVE NEGATIVE   Bilirubin Urine NEGATIVE NEGATIVE   Ketones, ur NEGATIVE NEGATIVE mg/dL   Protein, ur 30 (A) NEGATIVE mg/dL   Nitrite NEGATIVE NEGATIVE   Leukocytes,Ua NEGATIVE NEGATIVE   RBC / HPF 0-5 0 - 5 RBC/hpf  WBC, UA 0-5 0 - 5 WBC/hpf   Bacteria, UA RARE (A) NONE SEEN   Mucus PRESENT    Hyaline Casts, UA PRESENT     Comment: Performed at [facility], [place] [address], [place], [place] [address]  Strep pneumoniae urinary antigen     Status: None   Collection Time: 05/17/19  3:56 PM  Result Value Ref Range   Strep Pneumo Urinary Antigen NEGATIVE NEGATIVE    Comment:        Infection due to S. pneumoniae cannot be absolutely ruled out since the antigen present may be below the detection limit of the test. Performed at [facility], [address] [place], [place] [address]   POC SARS  Coronavirus 2 Ag-ED - Nasal Swab (BD Veritor Kit)     Status: None   Collection Time: 05/17/19  3:59 PM  Result Value Ref Range   SARS Coronavirus 2 Ag NEGATIVE NEGATIVE    Comment: (NOTE) SARS-CoV-2 antigen NOT DETECTED.  Negative results are presumptive.  Negative results do not preclude SARS-CoV-2 infection and should not be used as the sole basis for treatment or other patient management decisions, including infection  control decisions, particularly in the presence of clinical signs and  symptoms consistent with COVID-19, or in those who have been in contact with the virus.  Negative results must be combined with clinical observations, patient history, and epidemiological information. The expected result is Negative. Fact Sheet for Patients: PodPark.tn Fact Sheet for Healthcare Providers: GiftContent.is This test is not yet approved or cleared by the [place] FDA and  has been authorized for detection and/or diagnosis of SARS-CoV-2 by FDA under an Emergency Use Authorization (EUA).  This EUA will remain in effect (meaning this test can be used) for the duration of  the COVID-19 de claration under Section 564(b)(1) of the Act, 21 U.S.C. section 360bbb-3(b)(1), unless the authorization is terminated or revoked sooner.   SARS CORONAVIRUS 2 (TAT 6-24 HRS) Nasopharyngeal Nasopharyngeal Swab     Status: None   Collection Time: 05/17/19  7:48 PM   Specimen: Nasopharyngeal Swab  Result Value Ref Range   SARS Coronavirus 2 NEGATIVE NEGATIVE    Comment: (NOTE) SARS-CoV-2 target nucleic acids are NOT DETECTED. The SARS-CoV-2 RNA is generally detectable in upper and lower respiratory specimens during the acute phase of infection. Negative results do not preclude SARS-CoV-2 infection, do not rule out co-infections with other pathogens, and should not be used as the sole basis for treatment or other patient management  decisions. Negative results must be combined with clinical observations, patient history, and epidemiological information. The expected result is Negative. Fact Sheet for Patients: SugarRoll.be Fact Sheet for Healthcare Providers: https://www.woods-mathews.com/ This test is not yet approved or cleared by the [place] FDA and  has been authorized for detection and/or diagnosis of SARS-CoV-2 by FDA under an Emergency Use Authorization (EUA). This EUA will remain  in effect (meaning this test can be used) for the duration of the COVID-19 declaration under Section 56 4(b)(1) of the Act, 21 U.S.C. section 360bbb-3(b)(1), unless the authorization is terminated or revoked sooner. Performed at [facility], [place] [address] [place], [place] [address]   Expectorated sputum assessment w rflx to resp cult     Status: None   Collection Time: 05/17/19  7:57 PM   Specimen: Expectorated Sputum  Result Value Ref Range   Specimen Description EXPECTORATED SPUTUM    Special Requests NONE    Sputum  evaluation      Sputum specimen not acceptable for testing.  Please recollect.   RESULT CALLED TO, READ BACK BY AND VERIFIED WITH: Ronita Hipps RN 05/17/19 2112 JDW Performed at [facility] [address], [place], [place] [address]    Report Status 05/17/2019 FINAL   CBG monitoring, ED     Status: Abnormal   Collection Time: 05/17/19 10:15 PM  Result Value Ref Range   Glucose-Capillary 191 (H) 70 - 99 mg/dL    Comment: Glucose reference range applies only to samples taken after fasting for at least 8 hours.  T-helper cells (CD4) count (not at [facility])     Status: Abnormal   Collection Time: 05/18/19  5:00 AM  Result Value Ref Range   CD4 T Cell Abs 64 (L) 400 - 1,790 /uL   CD4 % Helper T Cell 6 (L) 33 - 65 %    Comment: Performed at [facility], [place] [address], [place], [place] [address]  CBC     Status: Abnormal   Collection Time:  05/18/19  5:00 AM  Result Value Ref Range   WBC 7.2 4.0 - 10.5 K/uL   RBC 3.34 (L) 4.22 - 5.81 MIL/uL   Hemoglobin 8.1 (L) 13.0 - 17.0 g/dL   HCT 28.5 (L) 39.0 - 52.0 %   MCV 85.3 80.0 - 100.0 fL   MCH 24.3 (L) 26.0 - 34.0 pg   MCHC 28.4 (L) 30.0 - 36.0 g/dL   RDW 25.3 (H) 11.5 - 15.5 %   Platelets 213 150 - 400 K/uL   nRBC 0.0 0.0 - 0.2 %    Comment: Performed at [facility], [place] [address] [place], [place] [address]  Basic metabolic panel     Status: Abnormal   Collection Time: 05/18/19  5:00 AM  Result Value Ref Range   Sodium 136 135 - 145 mmol/L   Potassium 4.5 3.5 - 5.1 mmol/L   Chloride 105 98 - 111 mmol/L   CO2 24 22 - 32 mmol/L   Glucose, Bld 169 (H) 70 - 99 mg/dL    Comment: Glucose reference range applies only to samples taken after fasting for at least 8 hours.   BUN 13 6 - 20 mg/dL   Creatinine, Ser 1.12 0.61 - 1.24 mg/dL   Calcium 8.6 (L) 8.9 - 10.3 mg/dL   GFR calc non Af Amer >60 >60 mL/min   GFR calc Af Amer >60 >60 mL/min   Anion gap 7 5 - 15    Comment: Performed at [facility] [address] [place], [place] [address]  Vitamin B12     Status: None   Collection Time: 05/18/19  5:00 AM  Result Value Ref Range   Vitamin B-12 607 180 - 914 pg/mL    Comment: (NOTE) This assay is not validated for testing neonatal or myeloproliferative syndrome specimens for Vitamin B12 levels. Performed at [facility], [place] [address] [place],  [address]   Folate     Status: None   Collection Time: 05/18/19  5:00 AM  Result Value Ref Range   Folate 12.5 >5.9 ng/mL    Comment: Performed at [facility] [address], [place], [place] [address]  Iron and TIBC     Status: Abnormal   Collection Time: 05/18/19  5:00 AM  Result Value Ref Range   Iron 20 (L) 45 - 182 ug/dL   TIBC 308 250 - 450 ug/dL  Saturation Ratios 6 (L) 17.9 - 39.5 %   UIBC 288 ug/dL    Comment: Performed at [facility], [place] [address] [place], [place]  [address]  Ferritin     Status: None   Collection Time: 05/18/19  5:00 AM  Result Value Ref Range   Ferritin 47 24 - 336 ng/mL    Comment: Performed at [facility] [address], [place],  [address]  CBG monitoring, ED     Status: Abnormal   Collection Time: 05/18/19  7:24 AM  Result Value Ref Range   Glucose-Capillary 186 (H) 70 - 99 mg/dL    Comment: Glucose reference range applies only to samples taken after fasting for at least 8 hours.    MICRO: pending IMAGING: CT Angio Chest PE W and/or Wo Contrast  Result Date: 05/17/2019 CLINICAL DATA:  Shortness of breath. EXAM: CT ANGIOGRAPHY CHEST WITH CONTRAST TECHNIQUE: Multidetector CT imaging of the chest was performed using the standard protocol during bolus administration of intravenous contrast. Multiplanar CT image reconstructions and MIPs were obtained to evaluate the vascular anatomy. CONTRAST:  64m OMNIPAQUE IOHEXOL 350 MG/ML SOLN COMPARISON:  None. FINDINGS: Cardiovascular: Evaluation for pulmonary emboli at the subsegmental level is limited by respiratory motion artifact.There is no pulmonary embolus. The main pulmonary artery is within normal limits for size. There is no CT evidence of acute right heart strain. The visualized aorta is normal. Heart size is normal, without pericardial effusion. Mediastinum/Nodes: --mild mediastinal adenopathy is noted. --No axillary lymphadenopathy. --No supraclavicular lymphadenopathy. --Normal thyroid gland. --the esophagus is unremarkable. Lungs/Pleura: Extensive left upper lobe airspace consolidation is noted. Emphysema is noted. Bronchiectasis is noted at the lung bases bilaterally with areas of bronchial wall thickening and mucus plugging. There is no pneumothorax. No significant pleural effusion. Upper Abdomen: No acute abnormality. Musculoskeletal: No chest wall abnormality. No acute or significant osseous findings. Review of the MIP images confirms the above findings. IMPRESSION: 1. No  acute pulmonary embolism. 2. Extensive left upper lobe consolidation concerning for pneumonia. 3. Again noted are chronic lung changes bilaterally with moderate emphysema and extensive bronchiectasis with mucous plugging and bronchial wall thickening in the left lower lobe. Consider outpatient pulmonary medicine follow-up for further evaluation. 4. Mediastinal adenopathy is again noted and is favored to be reactive. Electronically Signed   By: CConstance HolsterM.D.   On: 05/17/2019 18:13   DG Chest Portable 1 View  Result Date: 05/17/2019 CLINICAL DATA:  Chest pain and shortness of breath for 1 month. Fever for 1 week. EXAM: PORTABLE CHEST 1 VIEW COMPARISON:  Single-view of the chest 11/10/2018. FINDINGS: The lungs are severely emphysematous. There is airspace disease throughout the left mid and upper lung zone consistent with pneumonia. Streaky opacities in the left base are suggestive of scar and unchanged. No pneumothorax. Trace left pleural effusion. Heart size normal. IMPRESSION: Extensive airspace disease in the left chest consistent with pneumonia. Recommend follow-up to clearing. Emphysema. Electronically Signed   By: TInge RiseM.D.   On: 05/17/2019 14:50   Assessment/Plan:  89yoM with poorly controlled hiv disease admitted for sob, CXR suggestive of pneumonia - with concern for pcp pneumonia  - continue on azithromycin and ceftriaxone - continue on bactrim until DFA returns - will plan to restart on his HIV regimen of tivicay plus symtuza  oi proph = currently on bactrim.  Emphysema/bronchiectasis = recommend nebulized saline BID so that can help with pulmonary hygiene with flutter valve

## 2019-05-18 NOTE — Progress Notes (Signed)
PHARMACY - PHYSICIAN COMMUNICATION CRITICAL VALUE ALERT - BLOOD CULTURE IDENTIFICATION (BCID)  [name] is an 49 y.o. male who presented to [facility] on [facility]/17/2019 with a chief complaint of SOB. Hx significant for HIV, no ART in 6 months. Being treated for CAP and presumed PJP.  Assessment:  1/4 anaerobic bottle growing GPC in clusters. No BCID done unless other set turns positive.  Name of physician (or Provider) Contacted: Dr. Sharolyn Douglas.  Current antibiotics: azithromycin, ceftriaxone, Bactrim IV  Changes to prescribed antibiotics recommended:  No change needed for now. ID consult team aware.  Lulu Riding, PharmD PGY1 Pharmacy Resident  Please check AMION for all [facility] Pharmacy phone numbers After 10:00 PM, call Main Pharmacy [phone]  05/18/2019  8:36 AM

## 2019-05-18 NOTE — ED Notes (Signed)
MS   Breakfast ordered  

## 2019-05-18 NOTE — ED Notes (Signed)
Lunch Tray Ordered @ 1054. 

## 2019-05-19 ENCOUNTER — Inpatient Hospital Stay (HOSPITAL_COMMUNITY): Payer: Self-pay

## 2019-05-19 DIAGNOSIS — B9561 Methicillin susceptible Staphylococcus aureus infection as the cause of diseases classified elsewhere: Secondary | ICD-10-CM

## 2019-05-19 DIAGNOSIS — R7881 Bacteremia: Secondary | ICD-10-CM

## 2019-05-19 DIAGNOSIS — M549 Dorsalgia, unspecified: Secondary | ICD-10-CM

## 2019-05-19 LAB — HIV-1 RNA QUANT-NO REFLEX-BLD
HIV 1 RNA Quant: 840 copies/mL
LOG10 HIV-1 RNA: 2.924 log10copy/mL

## 2019-05-19 LAB — CBC WITH DIFFERENTIAL/PLATELET
Abs Immature Granulocytes: 0.11 10*3/uL — ABNORMAL HIGH (ref 0.00–0.07)
Basophils Absolute: 0 10*3/uL (ref 0.0–0.1)
Basophils Relative: 0 %
Eosinophils Absolute: 0 10*3/uL (ref 0.0–0.5)
Eosinophils Relative: 0 %
HCT: 27.3 % — ABNORMAL LOW (ref 39.0–52.0)
Hemoglobin: 8 g/dL — ABNORMAL LOW (ref 13.0–17.0)
Immature Granulocytes: 1 %
Lymphocytes Relative: 15 %
Lymphs Abs: 1.7 10*3/uL (ref 0.7–4.0)
MCH: 24.5 pg — ABNORMAL LOW (ref 26.0–34.0)
MCHC: 29.3 g/dL — ABNORMAL LOW (ref 30.0–36.0)
MCV: 83.5 fL (ref 80.0–100.0)
Monocytes Absolute: 0.9 10*3/uL (ref 0.1–1.0)
Monocytes Relative: 8 %
Neutro Abs: 8.9 10*3/uL — ABNORMAL HIGH (ref 1.7–7.7)
Neutrophils Relative %: 76 %
Platelets: 242 10*3/uL (ref 150–400)
RBC: 3.27 MIL/uL — ABNORMAL LOW (ref 4.22–5.81)
RDW: 25.2 % — ABNORMAL HIGH (ref 11.5–15.5)
WBC: 11.7 10*3/uL — ABNORMAL HIGH (ref 4.0–10.5)
nRBC: 0 % (ref 0.0–0.2)

## 2019-05-19 LAB — BASIC METABOLIC PANEL
Anion gap: 10 (ref 5–15)
BUN: 15 mg/dL (ref 6–20)
CO2: 22 mmol/L (ref 22–32)
Calcium: 8.9 mg/dL (ref 8.9–10.3)
Chloride: 103 mmol/L (ref 98–111)
Creatinine, Ser: 1.1 mg/dL (ref 0.61–1.24)
GFR calc Af Amer: >60 mL/min (ref >60)
GFR calc non Af Amer: >60 mL/min (ref >60)
Glucose, Bld: 136 mg/dL — ABNORMAL HIGH (ref 70–99)
Potassium: 4.3 mmol/L (ref 3.5–5.1)
Sodium: 135 mmol/L (ref 135–145)

## 2019-05-19 LAB — LEGIONELLA PNEUMOPHILA SEROGP 1 UR AG: L. pneumophila Serogp 1 Ur Ag: NEGATIVE

## 2019-05-19 LAB — PNEUMOCYSTIS JIROVECI SMEAR BY DFA: Pneumocystis jiroveci Ag: NEGATIVE

## 2019-05-19 LAB — GLUCOSE, CAPILLARY
Glucose-Capillary: 132 mg/dL — ABNORMAL HIGH (ref 70–99)
Glucose-Capillary: 163 mg/dL — ABNORMAL HIGH (ref 70–99)
Glucose-Capillary: 234 mg/dL — ABNORMAL HIGH (ref 70–99)

## 2019-05-19 MED ORDER — CEFAZOLIN SODIUM-DEXTROSE 2-4 GM/100ML-% IV SOLN
2.0000 g | Freq: Three times a day (TID) | INTRAVENOUS | Status: DC
  Administered 2019-05-19 – 2019-05-26 (×20): 2 g via INTRAVENOUS
  Filled 2019-05-19 (×22): qty 100

## 2019-05-19 MED ORDER — SULFAMETHOXAZOLE-TRIMETHOPRIM 800-160 MG PO TABS: 1.0000 | ORAL_TABLET | Freq: Every day | ORAL | Status: DC

## 2019-05-19 MED ORDER — DOLUTEGRAVIR SODIUM 50 MG PO TABS
50.0000 mg | ORAL_TABLET | Freq: Every day | ORAL | Status: DC
  Administered 2019-05-19 – 2019-05-26 (×8): 50 mg via ORAL
  Filled 2019-05-19 (×9): qty 1

## 2019-05-19 MED ORDER — SULFAMETHOXAZOLE-TRIMETHOPRIM 800-160 MG PO TABS
1.0000 | ORAL_TABLET | Freq: Every day | ORAL | Status: DC
  Administered 2019-05-20 – 2019-05-26 (×7): 1 via ORAL
  Filled 2019-05-19 (×9): qty 1

## 2019-05-19 MED ORDER — PREDNISONE 20 MG PO TABS
40.0000 mg | ORAL_TABLET | Freq: Every day | ORAL | Status: DC
  Administered 2019-05-20 – 2019-05-21 (×2): 40 mg via ORAL
  Filled 2019-05-19 (×2): qty 2

## 2019-05-19 MED ORDER — VANCOMYCIN HCL 1250 MG/250ML IV SOLN
1250.0000 mg | Freq: Two times a day (BID) | INTRAVENOUS | Status: DC
  Administered 2019-05-19 – 2019-05-20 (×2): 1250 mg via INTRAVENOUS
  Filled 2019-05-19 (×2): qty 250

## 2019-05-19 MED ORDER — DARUN-COBIC-EMTRICIT-TENOFAF 800-150-200-10 MG PO TABS
1.0000 | ORAL_TABLET | Freq: Every day | ORAL | Status: DC
  Administered 2019-05-20 – 2019-05-26 (×7): 1 via ORAL
  Filled 2019-05-19 (×8): qty 1

## 2019-05-19 MED ORDER — VANCOMYCIN HCL 1500 MG/300ML IV SOLN
1500.0000 mg | Freq: Once | INTRAVENOUS | Status: AC
  Administered 2019-05-19: 1500 mg via INTRAVENOUS
  Filled 2019-05-19: qty 300

## 2019-05-19 NOTE — Progress Notes (Addendum)
    [facility]    Date of Admission:  05/17/2019   Total days of antibiotics 3           ID: [name] is a 49 y.o. 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 He reports back pain- new onset  Medications:  . enoxaparin (LOVENOX) injection  40 mg Subcutaneous Q24H  . ferrous sulfate  325 mg Oral Q breakfast  . guaiFENesin  600 mg Oral BID  . insulin aspart  0-9 Units Subcutaneous TID WC  . predniSONE  40 mg Oral BID WC  . sodium chloride HYPERTONIC  4 mL Nebulization BID  . [START ON 05/20/2019] sulfamethoxazole-trimethoprim  1 tablet Oral Daily    Objective: Vital signs in last 24 hours: Temp:  [97.7 F (36.5 C)-98.2 F (36.8 C)] 98 F (36.7 C) (03/05 1629) Pulse Rate:  [80-88] 80 (03/05 1629) Resp:  [16-18] 18 (03/05 1629) BP: (103-116)/(67-77) 116/77 (03/05 1629) SpO2:  [95 %-99 %] 99 % (03/05 1629) Weight:  [77.4 kg] 77.4 kg (03/04 2058) Physical Exam  Constitutional: He is oriented to person, place, and time. He appears well-developed and well-nourished. No distress.  HENT:  Mouth/Throat: Oropharynx is clear and moist. No oropharyngeal exudate.  Cardiovascular: Normal rate, regular rhythm and normal heart sounds. Exam reveals no gallop and no friction rub.  No murmur heard.  Pulmonary/Chest: Effort normal and breath sounds normal. No respiratory distress. He has no wheezes.  Abdominal: Soft. Bowel sounds are normal. He exhibits no distension. There is no tenderness.  Lymphadenopathy:  He has no cervical adenopathy.  Neurological: He is alert and oriented to person, place, and time.  Skin: Skin is warm and dry.  No rash noted. No erythema.  Psychiatric: He has a normal mood and affect. His behavior is normal.     Lab Results Recent Labs    05/18/19 0500 05/19/19 0552  WBC 7.2 11.7*  HGB 8.1* 8.0*  HCT 28.5* 27.3*  NA 136 135  K 4.5 4.3  CL 105 103  CO2 24 22  BUN 13 15  CREATININE 1.12 1.10   Liver Panel Recent Labs    05/17/19 1203  PROT 9.1*  ALBUMIN 2.5*  AST 24  ALT 13  ALKPHOS 61  BILITOT 0.8    Microbiology: Staph aureus blood cx + Studies/Results: No results found.   Assessment/Plan: Bacteremia = staph aureus bacteremia likely secondary to pneumonia. We will narrow abtx to vancomycin plus cefazolin until sensitivities return. He will need TTE to evaluate for endocarditis  Pneumonia = thought to be bacterial (SA) rather than fungal (PJP). wil follow up on DFA stain. Can taper steroids that were empirically started for PJP  oi proph = continue on bactrim ds daily,   Back pain = please get lumbar MRI to evaluate for discitis  hiv disease= will start in symtuza and tivicay  [facility] Cell: [phone] Pager: [phone]  05/19/2019, 6:23 PM

## 2019-05-19 NOTE — Plan of Care (Signed)
  Problem: Clinical Measurements: Goal: Ability to maintain clinical measurements within normal limits will improve Outcome: Progressing   Problem: Clinical Measurements: Goal: Respiratory complications will improve Outcome: Progressing   

## 2019-05-19 NOTE — Progress Notes (Signed)
PROGRESS NOTE  Anthony PEREZPEREZ OEV:[id number] DOB: Sep 07, 1970 DOA: 05/17/2019 PCP: [name], MD  HPI/Recap of past 24 hours: HPI from Dr. Alinda Deem [name] is a 49 y.o. 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 Patient has been having approximately 1 month of progressively worsening dry cough and shortness of breath with bilateral lower chest pain.  He has been having intermittent fevers, chills, and lower extremity swelling.  He says he has not been taking his antiretroviral medications for about 6 months now. In the ED, BP 116/73, pulse 124, RR 16, temp 99.9 Fahrenheit, SPO2 94% on room air.  SPO2 decreased to 90% and patient was placed on 3 L supplemental O2 via [place]. Labs are notable for BUN 15, creatinine 1.25, WBC 9.9, hemoglobin 8.4. COVID-19 negative. CTA chest PE study is negative for acute PE. Extensive left upper lobe consolidation is noted as well as chronic lung changes bilaterally with moderate emphysema and extensive bronchiectasis with mucous plugging in the left lower lobe. EDP discussed the case with on-call infectious disease who recommended treating for suspected pneumocystis pneumonia.  Patient was given IV ceftriaxone, azithromycin, and started on IV Bactrim.  Patient was also given prednisone 40 mg orally once.  The hospitalist service was consulted admit for further evaluation management.     Today, pt still c/o lower back pain, denies further chest pain. Denies any fever/chills, abdominal pain, N/V, SOB.     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 Extensive left upper lobe consolidation is noted as well as chronic lung changes bilaterally with moderate emphysema and extensive bronchiectasis with mucous plugging in the left lower lobe ID on board, appreciate recs Continue IV cefazolin/Vancomycin, IV Bactrim Continue oral prednisone 40 mg twice daily Hyper Continue supplemental oxygen as needed and wean off as able  ?Staph aureus bacteremia BC growing staph aureus X 1 set, await final report Repeat pending Continue IV cefazolin/Vancomycin, IV Bactrim  HIV Noncompliant with antiretroviral meds Repeat CD4-->6/64, RNA pending ID on board, appreciate recs  Anemia of chronic disease (HIV) Hemoglobin around baseline Anemia panel showed iron 20, sats 6, ferritin 47, folate 12.5, vitamin B12 607 Start oral iron supplementation Daily CBC  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 Byrd Hesselbach at [facility] Continue ostomy care  Type 2 diabetes mellitus A1c 5.4 SSI, Accu-Cheks, hypoglycemic protocol Patient not taking home Janumet and glipizide per report         Malnutrition Type:      Malnutrition Characteristics:      Nutrition Interventions:       Estimated body mass index is 22.51 kg/m as calculated from the following:   Height as of this encounter: 6\' 1"  (1.854 m).   Weight as of this encounter: 77.4 kg.     Code Status: Full  Family Communication: Plan to update family  Disposition Plan: Likely home after IV antibiotics and further work-up/sign off  by ID   Consultants:  ID  Procedures:  None  Antimicrobials:  Cefazolin  Vancomycin  Bactrim  DVT prophylaxis: Lovenox   Objective: Vitals:   05/18/19 2058 05/19/19 0437 05/19/19 0801 05/19/19 0918  BP:  111/75 115/76  103/67  Pulse: 88 83  87  Resp: 16 16  18   Temp: 98.2 F (36.8 C) 97.7 F (36.5 C)  97.8 F (36.6 C)  TempSrc: Oral Oral  Oral  SpO2: 98% 97% 95% 98%  Weight: 77.4 kg     Height:        Intake/Output Summary (Last 24 hours) at 05/19/2019 1449 Last data filed at 05/19/2019 1147 Gross per 24 hour  Intake 1330 ml  Output 3400 ml  Net -2070 ml   Filed Weights   05/17/19 1444 05/18/19 1300 05/18/19 2058  Weight: 75 kg 77.6 kg 77.4 kg    Exam:  General: NAD   Cardiovascular: S1, S2 present  Respiratory:  Diminished breath sounds bilaterally  Abdomen: Soft, nontender, nondistended, bowel sounds present, ostomy bag noted LLQ present  Musculoskeletal: No bilateral pedal edema noted  Skin: Normal  Psychiatry: Normal mood   Data Reviewed: CBC: Recent Labs  Lab 05/17/19 1203 05/18/19 0500 05/19/19 0552  WBC 9.9 7.2 11.7*  NEUTROABS 5.6  --  8.9*  HGB 8.4* 8.1* 8.0*  HCT 28.5* 28.5* 27.3*  MCV 82.4 85.3 83.5  PLT 219 213 154   Basic Metabolic Panel: Recent Labs  Lab 05/17/19 1203 05/18/19 0500 05/19/19 0552  NA 132* 136 135  K 3.2* 4.5 4.3  CL 100 105 103  CO2 23 24 22   GLUCOSE 151* 169* 136*  BUN 15 13 15   CREATININE 1.25* 1.12 1.10  CALCIUM 8.7* 8.6* 8.9   GFR: Estimated Creatinine Clearance: 89.9 mL/min (by C-G formula based on SCr of 1.1 mg/dL). Liver Function Tests: Recent Labs  Lab 05/17/19 1203  AST 24  ALT 13  ALKPHOS 61  BILITOT 0.8  PROT 9.1*  ALBUMIN 2.5*   No results for input(s): LIPASE, AMYLASE in the last 168 hours. No results for input(s): AMMONIA in the last 168 hours. Coagulation Profile: Recent Labs  Lab 05/17/19 1203  INR 1.1   Cardiac Enzymes: No results for input(s): CKTOTAL, CKMB, CKMBINDEX, TROPONINI in the last 168 hours. BNP (last 3 results) No results for input(s): PROBNP in the last 8760 hours. HbA1C: Recent Labs    05/17/19 1203  HGBA1C 5.4   CBG: Recent Labs  Lab 05/18/19 0724  05/18/19 1701 05/18/19 2057 05/19/19 0649 05/19/19 1127  GLUCAP 186* 160* 182* 132* 163*   Lipid Profile: No results for input(s): CHOL, HDL, LDLCALC, TRIG, CHOLHDL, LDLDIRECT in the last 72 hours. Thyroid Function Tests: No results for input(s): TSH, T4TOTAL, FREET4, T3FREE, THYROIDAB in the last 72 hours. Anemia Panel: Recent Labs    05/18/19 0500  VITAMINB12 607  FOLATE 12.5  FERRITIN 47  TIBC 308  IRON 20*   Urine analysis:    Component Value Date/Time   COLORURINE YELLOW 05/17/2019 1556   APPEARANCEUR CLEAR 05/17/2019 1556   LABSPEC 1.020 05/17/2019 1556   PHURINE 6.0 05/17/2019 1556   GLUCOSEU NEGATIVE 05/17/2019 1556   HGBUR NEGATIVE 05/17/2019 1556   BILIRUBINUR NEGATIVE 05/17/2019 1556   KETONESUR NEGATIVE 05/17/2019 1556   PROTEINUR 30 (A) 05/17/2019 1556   UROBILINOGEN 1.0 03/23/2014 1400   NITRITE NEGATIVE 05/17/2019 1556   LEUKOCYTESUR NEGATIVE 05/17/2019 1556   Sepsis Labs: @LABRCNTIP (procalcitonin:4,lacticidven:4)  ) Recent Results (from the past 240 hour(s))  Culture, blood (Routine x 2)     Status: None (Preliminary result)   Collection Time: 05/17/19 12:23 PM   Specimen: BLOOD  Result Value Ref Range Status   Specimen Description BLOOD RIGHT ANTECUBITAL  Final   Special Requests   Final    BOTTLES DRAWN AEROBIC AND ANAEROBIC Blood Culture adequate volume   Culture   Final    NO GROWTH 2 DAYS Performed at [facility], [address] [place], [place] [address]    Report Status PENDING  Incomplete  Culture, blood (Routine x 2)     Status: Abnormal (Preliminary result)   Collection Time: 05/17/19  3:45 PM   Specimen: BLOOD RIGHT ARM  Result Value Ref Range Status   Specimen Description BLOOD RIGHT ARM  Final   Special Requests   Final    BOTTLES DRAWN AEROBIC AND ANAEROBIC Blood Culture results may not be optimal due to an inadequate volume of blood received in culture bottles   Culture  Setup Time   Final    GRAM POSITIVE COCCI IN  CLUSTERS ANAEROBIC BOTTLE ONLY CRITICAL RESULT CALLED TO, READ BACK BY AND VERIFIED WITH: J. STEENWYK, PHARMD AT 0830 ON 05/18/19 BY C. JESSUP, MT.    Culture (A)  Final    STAPHYLOCOCCUS AUREUS SUSCEPTIBILITIES TO FOLLOW Performed at [facility], [address], [place], [place] [address]    Report Status PENDING  Incomplete  SARS CORONAVIRUS 2 (TAT 6-24 HRS) Nasopharyngeal Nasopharyngeal Swab     Status: None   Collection Time: 05/17/19  7:48 PM   Specimen: Nasopharyngeal Swab  Result Value Ref Range Status   SARS Coronavirus 2 NEGATIVE NEGATIVE Final    Comment: (NOTE) SARS-CoV-2 target nucleic acids are NOT DETECTED. The SARS-CoV-2 RNA is generally detectable in upper and lower respiratory specimens during the acute phase of infection. Negative results do not preclude SARS-CoV-2 infection, do not rule out co-infections with other pathogens, and should not be used as the sole basis for treatment or other patient management decisions. Negative results must be combined with clinical observations, patient history, and epidemiological information. The expected result is Negative. Fact Sheet for Patients: HairSlick.no Fact Sheet for Healthcare Providers: quierodirigir.com This test is not yet approved or cleared by the [place] FDA and  has been authorized for detection and/or diagnosis of SARS-CoV-2 by FDA under an Emergency Use Authorization (EUA). This EUA will remain  in effect (meaning this test can be used) for the duration of the COVID-19 declaration under Section 56 4(b)(1) of the Act, 21 U.S.C. section 360bbb-3(b)(1), unless the authorization is terminated or revoked sooner. Performed at [facility], [address] [place], [place] [address]   Expectorated sputum assessment w rflx to resp cult     Status: None   Collection Time: 05/17/19  7:57 PM   Specimen: Expectorated Sputum  Result Value Ref  Range Status   Specimen Description EXPECTORATED SPUTUM  Final   Special Requests NONE  Final   Sputum evaluation   Final    Sputum specimen not acceptable for testing.  Please recollect.   RESULT CALLED TO, READ BACK BY AND VERIFIED WITH: Jerelyn Scott RN 05/17/19 2112 JDW Performed at [facility], [address] [place], [place] [address]    Report Status 05/17/2019 FINAL  Final  Pneumocystis smear by DFA     Status: None   Collection Time: 05/17/19  7:57 PM   Specimen: Sputum; Respiratory  Result Value Ref Range  Status   Specimen Source-PJSRC EXPECTORATED SPUTUM  Final   Pneumocystis jiroveci Ag NEGATIVE  Final    Comment: Performed at [facility] Performed at [name], [address] [place], [place] [address]       Studies: No results found.  Scheduled Meds: . enoxaparin (LOVENOX) injection  40 mg Subcutaneous Q24H  . ferrous sulfate  325 mg Oral Q breakfast  . guaiFENesin  600 mg Oral BID  . insulin aspart  0-9 Units Subcutaneous TID WC  . predniSONE  40 mg Oral BID WC  . sodium chloride HYPERTONIC  4 mL Nebulization BID  . [START ON 05/20/2019] sulfamethoxazole-trimethoprim  1 tablet Oral Daily    Continuous Infusions: .  ceFAZolin (ANCEF) IV 2 g (05/19/19 1425)  . vancomycin       LOS: 2 days     Briant Cedar, MD [facility]  If 7PM-7AM, please contact night-coverage [email or web address] 05/19/2019, 2:49 PM

## 2019-05-19 NOTE — Progress Notes (Signed)
Pharmacy Antibiotic Note  [name] is a 49 y.o. male admitted on 05/17/2019 with acute respiratory failure with concern for CAP and pneumocystis pneumonia. PMH significant for HIV, has been off ART for 6 months, CD4 count very low 64 on admission. Today blood cx is growing 1/4 Staph aureus. Pharmacy consulted to dose vancomycin and cefazolin.  Pt is afebrile, WBC elevated at 11.7. SCr stable at 1.1.   Plan: Vancomycin 1500 mg IV once loading dose  Vancomycin 1250 mg IV Q 12 hrs. Goal AUC 400-550. Expected AUC: 550.9 SCr used: 1.1 Cefazolin 2 gm IV q8h Stop ceftriaxone and azithromycin Bactrim IV switched to PO for PJP prophylaxis per ID F/u c/s, renal function, and clinical improvement ID team following  Height: 6\' 1"  (185.4 cm) Weight: 170 lb 10.2 oz (77.4 kg) IBW/kg (Calculated) : 79.9  Temp (24hrs), Avg:97.9 F (36.6 C), Min:97.7 F (36.5 C), Max:98.2 F (36.8 C)  Recent Labs  Lab 05/17/19 1203 05/17/19 1204 05/17/19 1545 05/18/19 0500 05/19/19 0552  WBC 9.9  --   --  7.2 11.7*  CREATININE 1.25*  --   --  1.12 1.10  LATICACIDVEN  --  1.1 1.3  --   --     Estimated Creatinine Clearance: 89.9 mL/min (by C-G formula based on SCr of 1.1 mg/dL).    No Known Allergies  Antimicrobials this admission: 3/3 ceftriaxone >> 3/4 3/3 azithromycin >> 3/4 3/3 bactrim IV>> 3/4 PO for PJP prophylaxis >> 3/4 Vanc >> 3/4 cefazolin >>  Microbiology results: 3/3 BCx: 1/4 growing Staph aureus, susceptibility pending 3/3 Sputum: needs recollection 3/3 Pneumocystis smear: sent  Thank you for allowing pharmacy to be a part of this patient's care.  07/19/19, PharmD PGY1 Pharmacy Resident  Please check AMION for all [facility] phone numbers After 10:00 PM, call Main Pharmacy [phone]  05/19/2019 10:14 AM

## 2019-05-20 ENCOUNTER — Other Ambulatory Visit (HOSPITAL_COMMUNITY): Payer: Self-pay

## 2019-05-20 LAB — BASIC METABOLIC PANEL
Anion gap: 9 (ref 5–15)
BUN: 23 mg/dL — ABNORMAL HIGH (ref 6–20)
CO2: 22 mmol/L (ref 22–32)
Calcium: 9.2 mg/dL (ref 8.9–10.3)
Chloride: 102 mmol/L (ref 98–111)
Creatinine, Ser: 1.29 mg/dL — ABNORMAL HIGH (ref 0.61–1.24)
GFR calc Af Amer: >60 mL/min (ref >60)
GFR calc non Af Amer: >60 mL/min (ref >60)
Glucose, Bld: 192 mg/dL — ABNORMAL HIGH (ref 70–99)
Potassium: 4.7 mmol/L (ref 3.5–5.1)
Sodium: 133 mmol/L — ABNORMAL LOW (ref 135–145)

## 2019-05-20 LAB — CBC WITH DIFFERENTIAL/PLATELET
Abs Immature Granulocytes: 0.08 10*3/uL — ABNORMAL HIGH (ref 0.00–0.07)
Basophils Absolute: 0 10*3/uL (ref 0.0–0.1)
Basophils Relative: 0 %
Eosinophils Absolute: 0 10*3/uL (ref 0.0–0.5)
Eosinophils Relative: 0 %
HCT: 30.4 % — ABNORMAL LOW (ref 39.0–52.0)
Hemoglobin: 8.9 g/dL — ABNORMAL LOW (ref 13.0–17.0)
Immature Granulocytes: 1 %
Lymphocytes Relative: 16 %
Lymphs Abs: 1.7 10*3/uL (ref 0.7–4.0)
MCH: 24.5 pg — ABNORMAL LOW (ref 26.0–34.0)
MCHC: 29.3 g/dL — ABNORMAL LOW (ref 30.0–36.0)
MCV: 83.5 fL (ref 80.0–100.0)
Monocytes Absolute: 0.8 10*3/uL (ref 0.1–1.0)
Monocytes Relative: 7 %
Neutro Abs: 8.4 10*3/uL — ABNORMAL HIGH (ref 1.7–7.7)
Neutrophils Relative %: 76 %
Platelets: 304 10*3/uL (ref 150–400)
RBC: 3.64 MIL/uL — ABNORMAL LOW (ref 4.22–5.81)
RDW: 25.2 % — ABNORMAL HIGH (ref 11.5–15.5)
WBC: 11 10*3/uL — ABNORMAL HIGH (ref 4.0–10.5)
nRBC: 0 % (ref 0.0–0.2)

## 2019-05-20 LAB — CULTURE, BLOOD (ROUTINE X 2)

## 2019-05-20 LAB — GLUCOSE, CAPILLARY
Glucose-Capillary: 174 mg/dL — ABNORMAL HIGH (ref 70–99)
Glucose-Capillary: 185 mg/dL — ABNORMAL HIGH (ref 70–99)
Glucose-Capillary: 340 mg/dL — ABNORMAL HIGH (ref 70–99)
Glucose-Capillary: 273 mg/dL — ABNORMAL HIGH (ref 70–99)

## 2019-05-20 MED ORDER — INFLUENZA VAC SPLIT QUAD 0.5 ML IM SUSY
0.5000 mL | PREFILLED_SYRINGE | Freq: Once | INTRAMUSCULAR | Status: AC
  Administered 2019-05-20: 0.5 mL via INTRAMUSCULAR
  Filled 2019-05-20: qty 0.5

## 2019-05-20 NOTE — Progress Notes (Signed)
ID PROGRESS NOTE  S: afebrile, less dyspnea on exertion  O:  BP 117/76 (BP Location: Right Arm)   Pulse 82   Temp 98 F (36.7 C) (Oral)   Resp 18   Ht 6\' 1"  (1.854 m)   Wt 77.4 kg   SpO2 99%   BMI 22.51 kg/m  Constitutional: He is oriented to person, place, and time. He appears well-developed and well-nourished. No distress.  HENT:  Mouth/Throat: Oropharynx is clear and moist. No oropharyngeal exudate.  Cardiovascular: Normal rate, regular rhythm and normal heart sounds. Exam reveals no gallop and no friction rub.  No murmur heard.  Pulmonary/Chest: Effort normal and breath sounds normal. No respiratory distress. He has no wheezes.  Abdominal: Soft. Bowel sounds are normal. He exhibits no distension. There is no tenderness.  Lymphadenopathy:  He has no cervical adenopathy.  Neurological: He is alert and oriented to person, place, and time.  Skin: Skin is warm and dry. No rash noted. No erythema.  Psychiatric: He has a normal mood and affect. His behavior is normal.   Micro: 3/3 blood cx - MSSA; 3/5 blood cx NGTD    A/P: MSSA bacteremia secondary to pneumonia.  - will narrow abtx to cefazolin 2gm iv q 8hr - will need minimum of 2 wks of IV abtx, but will need TEE to see if any vegetation  HIV disease= VL 840, should be able to make undetectable quickly-- started back on symtuza and tivicay  OI proph = bactrim ds daily

## 2019-05-20 NOTE — Plan of Care (Signed)
?  Problem: Clinical Measurements: ?Goal: Diagnostic test results will improve ?Outcome: Progressing ?  ?Problem: Safety: ?Goal: Ability to remain free from injury will improve ?Outcome: Progressing ?  ?

## 2019-05-20 NOTE — Progress Notes (Signed)
PROGRESS NOTE  [name] ZOX:[id number] DOB: 1971-02-16 DOA: 05/17/2019 PCP: [name], MD  HPI/Recap of past 24 hours: HPI from Dr. Alinda Deem [name] is a 49 y.o. 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 Patient has been having approximately 1 month of progressively worsening dry cough and shortness of breath with bilateral lower chest pain.  He has been having intermittent fevers, chills, and lower extremity swelling.  He says he has not been taking his antiretroviral medications for about 6 months now. In the ED, BP 116/73, pulse 124, RR 16, temp 99.9 Fahrenheit, SPO2 94% on room air.  SPO2 decreased to 90% and patient was placed on 3 L supplemental O2 via [place]. Labs are notable for BUN 15, creatinine 1.25, WBC 9.9, hemoglobin 8.4. COVID-19 negative. CTA chest PE study is negative for acute PE. Extensive left upper lobe consolidation is noted as well as chronic lung changes bilaterally with moderate emphysema and extensive bronchiectasis with mucous plugging in the left lower lobe. EDP discussed the case with on-call infectious disease who recommended treating for suspected pneumocystis pneumonia.  Patient was given IV ceftriaxone, azithromycin, and started on IV Bactrim.  Patient was also given prednisone 40 mg orally once.  The hospitalist service was consulted admit for further evaluation management.     Today, pt still c/o dyspnea on ambulation, denies any other new complaints.     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 Extensive left upper lobe consolidation is noted as well as chronic lung changes bilaterally with moderate emphysema and extensive bronchiectasis with mucous plugging in the left lower lobe ID on board, appreciate recs Continue IV cefazolin, Bactrim Continue oral prednisone, plan to taper Continue supplemental oxygen as needed  MSSA bacteremia BC growing MSSA Repeat BC X 2, NGTD TTE pending, if neg will need TEE MRI lumbar to r/o discitis as pt c/o back pain  ID on board, continue IV cefazolin  HIV Noncompliant with antiretroviral meds Repeat CD4-->6/64, Viral load 840 ID on board, restart antiretrovirals  Anemia of chronic disease (HIV) Hemoglobin around baseline Anemia panel showed iron 20, sats 6, ferritin 47, folate 12.5, vitamin B12 607 Start oral iron supplementation Daily CBC  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 Byrd Hesselbach at [facility] Continue ostomy care  Type 2 diabetes mellitus A1c 5.4 SSI, Accu-Cheks, hypoglycemic protocol Patient not taking home Janumet and glipizide per report         Malnutrition Type:      Malnutrition Characteristics:      Nutrition Interventions:       Estimated body mass index is 22.51 kg/m as calculated from the following:   Height as of this encounter: 6\' 1"  (1.854 m).   Weight as of this encounter: 77.4 kg.     Code Status: Full  Family Communication: Plan to update family once able  Disposition Plan: Likely home once work  up is complete, ID sign off   Consultants:  ID  Procedures:  None  Antimicrobials:  Cefazolin  Bactrim  DVT prophylaxis: Lovenox   Objective: Vitals:   05/19/19 2033 05/20/19 0433 05/20/19 0945 05/20/19 0945  BP: 132/79 115/75  117/76  Pulse: 99 84  82    Resp: 20 20  18   Temp: 98 F (36.7 C) 97.9 F (36.6 C)  98 F (36.7 C)  TempSrc: Oral Oral  Oral  SpO2: 95% 96% 96% 99%  Weight:      Height:        Intake/Output Summary (Last 24 hours) at 05/20/2019 1546 Last data filed at 05/20/2019 1407 Gross per 24 hour  Intake 1849.93 ml  Output 2810 ml  Net -960.07 ml   Filed Weights   05/17/19 1444 05/18/19 1300 05/18/19 2058  Weight: 75 kg 77.6 kg 77.4 kg    Exam:  General: NAD   Cardiovascular: S1, S2 present  Respiratory:  Diminished breath sounds bilaterally  Abdomen: Soft, nontender, nondistended, bowel sounds present, ostomy bag noted LLQ present  Musculoskeletal: No bilateral pedal edema noted  Skin: Normal  Psychiatry: Normal mood   Data Reviewed: CBC: Recent Labs  Lab 05/17/19 1203 05/18/19 0500 05/19/19 0552 05/20/19 0426  WBC 9.9 7.2 11.7* 11.0*  NEUTROABS 5.6  --  8.9* 8.4*  HGB 8.4* 8.1* 8.0* 8.9*  HCT 28.5* 28.5* 27.3* 30.4*  MCV 82.4 85.3 83.5 83.5  PLT 219 213 242 304   Basic Metabolic Panel: Recent Labs  Lab 05/17/19 1203 05/18/19 0500 05/19/19 0552 05/20/19 0426  NA 132* 136 135 133*  K 3.2* 4.5 4.3 4.7  CL 100 105 103 102  CO2 23 24 22 22   GLUCOSE 151* 169* 136* 192*  BUN 15 13 15  23*  CREATININE 1.25* 1.12 1.10 1.29*  CALCIUM 8.7* 8.6* 8.9 9.2   GFR: Estimated Creatinine Clearance: 76.7 mL/min (A) (by C-G formula based on SCr of 1.29 mg/dL (H)). Liver Function Tests: Recent Labs  Lab 05/17/19 1203  AST 24  ALT 13  ALKPHOS 61  BILITOT 0.8  PROT 9.1*  ALBUMIN 2.5*   No results for input(s): LIPASE, AMYLASE in the last 168 hours. No results for input(s): AMMONIA in the last 168 hours. Coagulation Profile: Recent Labs  Lab 05/17/19 1203  INR 1.1   Cardiac Enzymes: No results for input(s): CKTOTAL, CKMB, CKMBINDEX, TROPONINI in the last 168 hours. BNP (last 3 results) No results for input(s): PROBNP in the last 8760 hours. HbA1C: No results for input(s): HGBA1C in  the last 72 hours. CBG: Recent Labs  Lab 05/19/19 0649 05/19/19 1127 05/19/19 1630 05/20/19 0724 05/20/19 1123  GLUCAP 132* 163* 234* 174* 185*   Lipid Profile: No results for input(s): CHOL, HDL, LDLCALC, TRIG, CHOLHDL, LDLDIRECT in the last 72 hours. Thyroid Function Tests: No results for input(s): TSH, T4TOTAL, FREET4, T3FREE, THYROIDAB in the last 72 hours. Anemia Panel: Recent Labs    05/18/19 0500  VITAMINB12 607  FOLATE 12.5  FERRITIN 47  TIBC 308  IRON 20*   Urine analysis:    Component Value Date/Time   COLORURINE YELLOW 05/17/2019 1556   APPEARANCEUR CLEAR 05/17/2019 1556   LABSPEC 1.020 05/17/2019 1556   PHURINE 6.0 05/17/2019 1556   GLUCOSEU NEGATIVE 05/17/2019 1556   HGBUR NEGATIVE 05/17/2019 1556   BILIRUBINUR NEGATIVE 05/17/2019 1556   KETONESUR NEGATIVE 05/17/2019 1556   PROTEINUR 30 (A) 05/17/2019 1556   UROBILINOGEN 1.0 03/23/2014 1400   NITRITE NEGATIVE 05/17/2019 1556  LEUKOCYTESUR NEGATIVE 05/17/2019 1556   Sepsis Labs: @LABRCNTIP (procalcitonin:4,lacticidven:4)  ) Recent Results (from the past 240 hour(s))  Culture, blood (Routine x 2)     Status: None (Preliminary result)   Collection Time: 05/17/19 12:23 PM   Specimen: BLOOD  Result Value Ref Range Status   Specimen Description BLOOD RIGHT ANTECUBITAL  Final   Special Requests   Final    BOTTLES DRAWN AEROBIC AND ANAEROBIC Blood Culture adequate volume   Culture   Final    NO GROWTH 3 DAYS Performed at [facility], [address], [place], [place]    Report Status PENDING  Incomplete  Culture, blood (Routine x 2)     Status: Abnormal   Collection Time: 05/17/19  3:45 PM   Specimen: BLOOD RIGHT ARM  Result Value Ref Range Status   Specimen Description BLOOD RIGHT ARM  Final   Special Requests   Final    BOTTLES DRAWN AEROBIC AND ANAEROBIC Blood Culture results may not be optimal due to an inadequate volume of blood received in culture bottles   Culture  Setup  Time   Final    GRAM POSITIVE COCCI IN CLUSTERS ANAEROBIC BOTTLE ONLY CRITICAL RESULT CALLED TO, READ BACK BY AND VERIFIED WITH: J. STEENWYK, PHARMD AT 0830 ON 05/18/19 BY C. JESSUP, MT. Performed at [facility], [address] [place], [place]    Culture STAPHYLOCOCCUS AUREUS (A)  Final   Report Status 05/20/2019 FINAL  Final   Organism ID, Bacteria STAPHYLOCOCCUS AUREUS  Final      Susceptibility   Staphylococcus aureus - MIC*    CIPROFLOXACIN >=8 RESISTANT Resistant     ERYTHROMYCIN >=8 RESISTANT Resistant     GENTAMICIN <=0.5 SENSITIVE Sensitive     OXACILLIN <=0.25 SENSITIVE Sensitive     TETRACYCLINE <=1 SENSITIVE Sensitive     VANCOMYCIN <=0.5 SENSITIVE Sensitive     TRIMETH/SULFA >=320 RESISTANT Resistant     CLINDAMYCIN RESISTANT Resistant     RIFAMPIN <=0.5 SENSITIVE Sensitive     Inducible Clindamycin POSITIVE Resistant     * STAPHYLOCOCCUS AUREUS  SARS CORONAVIRUS 2 (TAT 6-24 HRS) Nasopharyngeal Nasopharyngeal Swab     Status: None   Collection Time: 05/17/19  7:48 PM   Specimen: Nasopharyngeal Swab  Result Value Ref Range Status   SARS Coronavirus 2 NEGATIVE NEGATIVE Final    Comment: (NOTE) SARS-CoV-2 target nucleic acids are NOT DETECTED. The SARS-CoV-2 RNA is generally detectable in upper and lower respiratory specimens during the acute phase of infection. Negative results do not preclude SARS-CoV-2 infection, do not rule out co-infections with other pathogens, and should not be used as the sole basis for treatment or other patient management decisions. Negative results must be combined with clinical observations, patient history, and epidemiological information. The expected result is Negative. Fact Sheet for Patients: 07/17/19 Fact Sheet for Healthcare Providers: HairSlick.no This test is not yet approved or cleared by the quierodirigir.com FDA and  has been authorized for detection  and/or diagnosis of SARS-CoV-2 by FDA under an Emergency Use Authorization (EUA). This EUA will remain  in effect (meaning this test can be used) for the duration of the COVID-19 declaration under Section 56 4(b)(1) of the Act, 21 U.S.C. section 360bbb-3(b)(1), unless the authorization is terminated or revoked sooner. Performed at [facility], [address], [place], [place]   Expectorated sputum assessment w rflx to resp cult     Status: None   Collection  Time: 05/17/19  7:57 PM   Specimen: Expectorated Sputum  Result Value Ref Range Status   Specimen Description EXPECTORATED SPUTUM  Final   Special Requests NONE  Final   Sputum evaluation   Final    Sputum specimen not acceptable for testing.  Please recollect.   RESULT CALLED TO, READ BACK BY AND VERIFIED WITH: Jerelyn Scott RN 05/17/19 2112 JDW Performed at [facility], [address], [place], [place] [address]    Report Status 05/17/2019 FINAL  Final  Pneumocystis smear by DFA     Status: None   Collection Time: 05/17/19  7:57 PM   Specimen: Sputum; Respiratory  Result Value Ref Range Status   Specimen Source-PJSRC EXPECTORATED SPUTUM  Final   Pneumocystis jiroveci Ag NEGATIVE  Final    Comment: Performed at [facility] Performed at [facility], [address] [place], [place] [address]   Culture, blood (routine x 2)     Status: None (Preliminary result)   Collection Time: 05/19/19  4:39 PM   Specimen: BLOOD RIGHT HAND  Result Value Ref Range Status   Specimen Description BLOOD RIGHT HAND  Final   Special Requests   Final    BOTTLES DRAWN AEROBIC ONLY Blood Culture adequate volume   Culture   Final    NO GROWTH < 24 HOURS Performed at [facility], [address], [place], [place] [address]    Report Status PENDING  Incomplete  Culture, blood (routine x 2)     Status: None (Preliminary result)   Collection Time: 05/19/19  4:43 PM   Specimen: BLOOD LEFT HAND  Result Value Ref  Range Status   Specimen Description BLOOD LEFT HAND  Final   Special Requests   Final    BOTTLES DRAWN AEROBIC ONLY Blood Culture adequate volume   Culture   Final    NO GROWTH < 24 HOURS Performed at [facility], [address], [place], [place] [address]    Report Status PENDING  Incomplete      Studies: DG Thoracic Spine 2 View  Result Date: 05/19/2019 CLINICAL DATA:  Left thoracolumbar back pain for 1 month. No known injury. EXAM: THORACIC SPINE 2 VIEWS COMPARISON:  Reformats from chest CT 2 days ago 05/17/2019 FINDINGS: Upper thoracic spine on the lateral view slightly limited by underlying pulmonary disease, no abnormalities on recent chest CT. The alignment is maintained. Vertebral body heights are maintained. No significant disc space narrowing. No acute fracture. Posterior elements appear intact. There is no paravertebral soft tissue abnormality. Left upper lobe opacity assessed on recent chest CT. IMPRESSION: Negative radiographs of the thoracic spine. Electronically Signed   By: Narda Rutherford M.D.   On: 05/19/2019 19:41   DG Lumbar Spine 2-3 Views  Result Date: 05/19/2019 CLINICAL DATA:  Left thoracolumbar back pain for 1 month. No known injury. EXAM: LUMBAR SPINE - 2-3 VIEW COMPARISON:  Reformats from abdominal CT 01/10/2019. FINDINGS: The alignment is maintained. Vertebral body heights are normal. There is no listhesis. The posterior elements are intact. Mild endplate spurring at L3-L4 with preservation of disc spaces. No fracture. Sacroiliac joints are symmetric and normal. Non fusion posterior aspect of S1, normal variant. No evidence of focal bone lesion. IMPRESSION: Mild endplate spurring at L3-L4. Otherwise negative radiographs of the lumbar spine. Electronically Signed   By: Narda Rutherford M.D.   On: 05/19/2019 19:43    Scheduled Meds: . Darunavir-Cobicisctat-Emtricitabine-Tenofovir Alafenamide  1 tablet Oral Q breakfast  .  dolutegravir  50 mg Oral Daily  .  enoxaparin (LOVENOX) injection  40 mg Subcutaneous Q24H  . ferrous sulfate  325 mg Oral Q breakfast  . guaiFENesin  600 mg Oral BID  . insulin aspart  0-9 Units Subcutaneous TID WC  . predniSONE  40 mg Oral Q breakfast  . sodium chloride HYPERTONIC  4 mL Nebulization BID  . sulfamethoxazole-trimethoprim  1 tablet Oral Daily    Continuous Infusions: .  ceFAZolin (ANCEF) IV 2 g (05/20/19 1346)     LOS: 3 days     Alma Friendly, MD [facility]  If 7PM-7AM, please contact night-coverage [email or web address] 05/20/2019, 3:46 PM

## 2019-05-21 ENCOUNTER — Inpatient Hospital Stay (HOSPITAL_COMMUNITY): Payer: Self-pay

## 2019-05-21 DIAGNOSIS — R7881 Bacteremia: Secondary | ICD-10-CM

## 2019-05-21 LAB — CBC WITH DIFFERENTIAL/PLATELET
Abs Immature Granulocytes: 0.08 10*3/uL — ABNORMAL HIGH (ref 0.00–0.07)
Basophils Absolute: 0 10*3/uL (ref 0.0–0.1)
Basophils Relative: 0 %
Eosinophils Absolute: 0 10*3/uL (ref 0.0–0.5)
Eosinophils Relative: 0 %
HCT: 31.9 % — ABNORMAL LOW (ref 39.0–52.0)
Hemoglobin: 9.5 g/dL — ABNORMAL LOW (ref 13.0–17.0)
Immature Granulocytes: 1 %
Lymphocytes Relative: 26 %
Lymphs Abs: 2.7 10*3/uL (ref 0.7–4.0)
MCH: 24.7 pg — ABNORMAL LOW (ref 26.0–34.0)
MCHC: 29.8 g/dL — ABNORMAL LOW (ref 30.0–36.0)
MCV: 83.1 fL (ref 80.0–100.0)
Monocytes Absolute: 1.3 10*3/uL — ABNORMAL HIGH (ref 0.1–1.0)
Monocytes Relative: 12 %
Neutro Abs: 6.5 10*3/uL (ref 1.7–7.7)
Neutrophils Relative %: 61 %
Platelets: 365 10*3/uL (ref 150–400)
RBC: 3.84 MIL/uL — ABNORMAL LOW (ref 4.22–5.81)
RDW: 25 % — ABNORMAL HIGH (ref 11.5–15.5)
WBC: 10.6 10*3/uL — ABNORMAL HIGH (ref 4.0–10.5)
nRBC: 0 % (ref 0.0–0.2)

## 2019-05-21 LAB — ECHOCARDIOGRAM COMPLETE
Height: 73 in
Weight: 2731.06 oz

## 2019-05-21 LAB — GLUCOSE, CAPILLARY
Glucose-Capillary: 173 mg/dL — ABNORMAL HIGH (ref 70–99)
Glucose-Capillary: 138 mg/dL — ABNORMAL HIGH (ref 70–99)
Glucose-Capillary: 256 mg/dL — ABNORMAL HIGH (ref 70–99)
Glucose-Capillary: 279 mg/dL — ABNORMAL HIGH (ref 70–99)

## 2019-05-21 LAB — BASIC METABOLIC PANEL
Anion gap: 9 (ref 5–15)
BUN: 24 mg/dL — ABNORMAL HIGH (ref 6–20)
CO2: 24 mmol/L (ref 22–32)
Calcium: 9.3 mg/dL (ref 8.9–10.3)
Chloride: 101 mmol/L (ref 98–111)
Creatinine, Ser: 1.23 mg/dL (ref 0.61–1.24)
GFR calc Af Amer: >60 mL/min (ref >60)
GFR calc non Af Amer: >60 mL/min (ref >60)
Glucose, Bld: 207 mg/dL — ABNORMAL HIGH (ref 70–99)
Potassium: 4.3 mmol/L (ref 3.5–5.1)
Sodium: 134 mmol/L — ABNORMAL LOW (ref 135–145)

## 2019-05-21 MED ORDER — HYDROCODONE-ACETAMINOPHEN 5-325 MG PO TABS
1.0000 | ORAL_TABLET | Freq: Four times a day (QID) | ORAL | Status: DC | PRN
  Administered 2019-05-21: 1 via ORAL
  Filled 2019-05-21: qty 1

## 2019-05-21 NOTE — Progress Notes (Signed)
PROGRESS NOTE  [name] RSW:[id number] DOB: 1970/06/24 DOA: 05/17/2019 PCP: [name], MD  HPI/Recap of past 24 hours: HPI from Dr. Alinda Deem [name] is a 49 y.o. 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 Patient has been having approximately 1 month of progressively worsening dry cough and shortness of breath with bilateral lower chest pain.  He has been having intermittent fevers, chills, and lower extremity swelling.  He says he has not been taking his antiretroviral medications for about 6 months now. In the ED, BP 116/73, pulse 124, RR 16, temp 99.9 Fahrenheit, SPO2 94% on room air.  SPO2 decreased to 90% and patient was placed on 3 L supplemental O2 via [place]. Labs are notable for BUN 15, creatinine 1.25, WBC 9.9, hemoglobin 8.4. COVID-19 negative. CTA chest PE study is negative for acute PE. Extensive left upper lobe consolidation is noted as well as chronic lung changes bilaterally with moderate emphysema and extensive bronchiectasis with mucous plugging in the left lower lobe. EDP discussed the case with on-call infectious disease who recommended treating for suspected pneumocystis pneumonia.  Patient was given IV ceftriaxone, azithromycin, and started on IV Bactrim.  Patient was also given prednisone 40 mg orally once.  The hospitalist service was consulted admit for further evaluation management.     Today, pt still reports back pain, about 7/10, denies any other new complaints.       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 Extensive left upper lobe consolidation is noted as well as chronic lung changes bilaterally with moderate emphysema and extensive bronchiectasis with mucous plugging in the left lower lobe ID on board, appreciate recs Continue IV cefazolin, Bactrim Continue oral prednisone, plan to taper Continue supplemental oxygen as needed  MSSA bacteremia BC growing MSSA Repeat BC X 2, NGTD TTE showed EF of 60-65%, no evidence of vegetation, cardiology consulted for TEE, will keep NPO at midnight for possible 05/22/19 MRI lumbar to r/o discitis as pt c/o back pain  ID on board, continue IV cefazolin  HIV Noncompliant with antiretroviral meds Repeat CD4-->6/64, Viral load 840 ID on board, restart antiretrovirals  Anemia of chronic disease (HIV) Hemoglobin around baseline Anemia panel showed iron 20, sats 6, ferritin 47, folate 12.5, vitamin B12 607 Start oral iron supplementation Daily CBC  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 Byrd Hesselbach at [facility] Continue ostomy care  Type 2 diabetes mellitus A1c 5.4 SSI, Accu-Cheks, hypoglycemic protocol Patient not taking home Janumet and glipizide per report         Malnutrition Type:      Malnutrition Characteristics:      Nutrition Interventions:       Estimated body mass index is 22.52 kg/m as calculated from the following:   Height as of this encounter: 6\' 1"  (1.854 m).   Weight as of this encounter: 77.4 kg.     Code Status:  Full  Family Communication: Discussed with patient extensively   Disposition Plan: Likely home once work up is complete, pending TEE, ID sign off   Consultants:  ID  Procedures:  None  Antimicrobials:  Cefazolin  Bactrim  DVT  prophylaxis: Lovenox   Objective: Vitals:   05/20/19 1941 05/20/19 2041 05/21/19 0457 05/21/19 0931  BP:  109/66 103/68 111/72  Pulse:  96 83 88  Resp:  19 18 18   Temp:  97.8 F (36.6 C) (!) 97.5 F (36.4 C) 98 F (36.7 C)  TempSrc:  Oral Oral Oral  SpO2: 97% 97% 95% 98%  Weight:  77.4 kg    Height:        Intake/Output Summary (Last 24 hours) at 05/21/2019 1337 Last data filed at 05/21/2019 1319 Gross per 24 hour  Intake 2112.93 ml  Output 1885 ml  Net 227.93 ml   Filed Weights   05/18/19 1300 05/18/19 2058 05/20/19 2041  Weight: 77.6 kg 77.4 kg 77.4 kg    Exam:  General: NAD   Cardiovascular: S1, S2 present  Respiratory:  Diminished breath sounds bilaterally  Abdomen: Soft, nontender, nondistended, bowel sounds present, ostomy bag noted LLQ present  Musculoskeletal: No bilateral pedal edema noted  Skin: Normal  Psychiatry: Normal mood   Data Reviewed: CBC: Recent Labs  Lab 05/17/19 1203 05/18/19 0500 05/19/19 0552 05/20/19 0426 05/21/19 0547  WBC 9.9 7.2 11.7* 11.0* 10.6*  NEUTROABS 5.6  --  8.9* 8.4* 6.5  HGB 8.4* 8.1* 8.0* 8.9* 9.5*  HCT 28.5* 28.5* 27.3* 30.4* 31.9*  MCV 82.4 85.3 83.5 83.5 83.1  PLT 219 213 242 304 365   Basic Metabolic Panel: Recent Labs  Lab 05/17/19 1203 05/18/19 0500 05/19/19 0552 05/20/19 0426 05/21/19 0547  NA 132* 136 135 133* 134*  K 3.2* 4.5 4.3 4.7 4.3  CL 100 105 103 102 101  CO2 23 24 22 22 24   GLUCOSE 151* 169* 136* 192* 207*  BUN 15 13 15  23* 24*  CREATININE 1.25* 1.12 1.10 1.29* 1.23  CALCIUM 8.7* 8.6* 8.9 9.2 9.3   GFR: Estimated Creatinine Clearance: 80.4 mL/min (by C-G formula based on SCr of 1.23 mg/dL). Liver Function Tests: Recent Labs  Lab 05/17/19 1203  AST 24  ALT 13  ALKPHOS 61  BILITOT 0.8  PROT 9.1*  ALBUMIN 2.5*   No results for input(s): LIPASE, AMYLASE in the last 168 hours. No results for input(s): AMMONIA in the last 168 hours. Coagulation Profile: Recent Labs  Lab  05/17/19 1203  INR 1.1   Cardiac Enzymes: No results for input(s): CKTOTAL, CKMB, CKMBINDEX, TROPONINI in the last 168 hours. BNP (last 3 results) No results for input(s): PROBNP in the last 8760 hours. HbA1C: No results for input(s): HGBA1C in the last 72 hours. CBG: Recent Labs  Lab 05/20/19 1123 05/20/19 1626 05/20/19 2042 05/21/19 0653 05/21/19 1132  GLUCAP 185* 340* 273* 173* 138*   Lipid Profile: No results for input(s): CHOL, HDL, LDLCALC, TRIG, CHOLHDL, LDLDIRECT in the last 72 hours. Thyroid Function Tests: No results for input(s): TSH, T4TOTAL, FREET4, T3FREE, THYROIDAB in the last 72 hours. Anemia Panel: No results for input(s): VITAMINB12, FOLATE, FERRITIN, TIBC, IRON, RETICCTPCT in the last 72 hours. Urine analysis:    Component Value Date/Time   COLORURINE YELLOW 05/17/2019 1556   APPEARANCEUR CLEAR 05/17/2019 1556   LABSPEC 1.020 05/17/2019 1556   PHURINE 6.0 05/17/2019 1556   GLUCOSEU NEGATIVE 05/17/2019 1556   HGBUR NEGATIVE 05/17/2019 1556   BILIRUBINUR NEGATIVE 05/17/2019  1556   KETONESUR NEGATIVE 05/17/2019 1556   PROTEINUR 30 (A) 05/17/2019 1556   UROBILINOGEN 1.0 03/23/2014 1400   NITRITE NEGATIVE 05/17/2019 1556   LEUKOCYTESUR NEGATIVE 05/17/2019 1556   Sepsis Labs: @LABRCNTIP (procalcitonin:4,lacticidven:4)  ) Recent Results (from the past 240 hour(s))  Culture, blood (Routine x 2)     Status: None (Preliminary result)   Collection Time: 05/17/19 12:23 PM   Specimen: BLOOD  Result Value Ref Range Status   Specimen Description BLOOD RIGHT ANTECUBITAL  Final   Special Requests   Final    BOTTLES DRAWN AEROBIC AND ANAEROBIC Blood Culture adequate volume   Culture   Final    NO GROWTH 4 DAYS Performed at [facility], [address], [place], [place]    Report Status PENDING  Incomplete  Culture, blood (Routine x 2)     Status: Abnormal   Collection Time: 05/17/19  3:45 PM   Specimen: BLOOD RIGHT ARM  Result Value Ref  Range Status   Specimen Description BLOOD RIGHT ARM  Final   Special Requests   Final    BOTTLES DRAWN AEROBIC AND ANAEROBIC Blood Culture results may not be optimal due to an inadequate volume of blood received in culture bottles   Culture  Setup Time   Final    GRAM POSITIVE COCCI IN CLUSTERS ANAEROBIC BOTTLE ONLY CRITICAL RESULT CALLED TO, READ BACK BY AND VERIFIED WITH: J. STEENWYK, PHARMD AT 0830 ON 05/18/19 BY C. JESSUP, MT. Performed at [facility], [address], [place], [place]    Culture STAPHYLOCOCCUS AUREUS (A)  Final   Report Status 05/20/2019 FINAL  Final   Organism ID, Bacteria STAPHYLOCOCCUS AUREUS  Final      Susceptibility   Staphylococcus aureus - MIC*    CIPROFLOXACIN >=8 RESISTANT Resistant     ERYTHROMYCIN >=8 RESISTANT Resistant     GENTAMICIN <=0.5 SENSITIVE Sensitive     OXACILLIN <=0.25 SENSITIVE Sensitive     TETRACYCLINE <=1 SENSITIVE Sensitive     VANCOMYCIN <=0.5 SENSITIVE Sensitive     TRIMETH/SULFA >=320 RESISTANT Resistant     CLINDAMYCIN RESISTANT Resistant     RIFAMPIN <=0.5 SENSITIVE Sensitive     Inducible Clindamycin POSITIVE Resistant     * STAPHYLOCOCCUS AUREUS  SARS CORONAVIRUS 2 (TAT 6-24 HRS) Nasopharyngeal Nasopharyngeal Swab     Status: None   Collection Time: 05/17/19  7:48 PM   Specimen: Nasopharyngeal Swab  Result Value Ref Range Status   SARS Coronavirus 2 NEGATIVE NEGATIVE Final    Comment: (NOTE) SARS-CoV-2 target nucleic acids are NOT DETECTED. The SARS-CoV-2 RNA is generally detectable in upper and lower respiratory specimens during the acute phase of infection. Negative results do not preclude SARS-CoV-2 infection, do not rule out co-infections with other pathogens, and should not be used as the sole basis for treatment or other patient management decisions. Negative results must be combined with clinical observations, patient history, and epidemiological information. The expected result is Negative. Fact  Sheet for Patients: 07/17/19 Fact Sheet for Healthcare Providers: HairSlick.no This test is not yet approved or cleared by the quierodirigir.com FDA and  has been authorized for detection and/or diagnosis of SARS-CoV-2 by FDA under an Emergency Use Authorization (EUA). This EUA will remain  in effect (meaning this test can be used) for the duration of the COVID-19 declaration under Section 56 4(b)(1) of the Act, 21 U.S.C. section 360bbb-3(b)(1), unless the authorization is terminated or revoked sooner. Performed at [facility], [address], [place], [place] [address]   Expectorated sputum assessment w rflx to resp cult     Status: None   Collection Time: 05/17/19  7:57 PM   Specimen: Expectorated Sputum  Result Value Ref Range Status   Specimen Description EXPECTORATED SPUTUM  Final   Special Requests NONE  Final   Sputum evaluation   Final    Sputum specimen not acceptable for testing.  Please recollect.   RESULT CALLED TO, READ BACK BY AND VERIFIED WITH: Jerelyn Scott RN 05/17/19 2112 JDW Performed at [facility], [address] [place], [place] [address]    Report Status 05/17/2019 FINAL  Final  Pneumocystis smear by DFA     Status: None   Collection Time: 05/17/19  7:57 PM   Specimen: Sputum; Respiratory  Result Value Ref Range Status   Specimen Source-PJSRC EXPECTORATED SPUTUM  Final   Pneumocystis jiroveci Ag NEGATIVE  Final    Comment: Performed at [facility] Performed at [facility], [address], [place], [place] [address]   Culture, blood (routine x 2)     Status: None (Preliminary result)   Collection Time: 05/19/19  4:39 PM   Specimen: BLOOD RIGHT HAND  Result Value Ref Range Status   Specimen Description BLOOD RIGHT HAND  Final   Special Requests   Final    BOTTLES DRAWN AEROBIC ONLY Blood Culture adequate volume   Culture   Final    NO GROWTH 2 DAYS Performed at [facility], [address] [place], [place] [address]    Report Status PENDING  Incomplete  Culture, blood (routine x 2)     Status: None (Preliminary result)   Collection Time: 05/19/19  4:43 PM   Specimen: BLOOD LEFT HAND  Result Value Ref Range Status   Specimen Description BLOOD LEFT HAND  Final   Special Requests   Final    BOTTLES DRAWN AEROBIC ONLY Blood Culture adequate volume   Culture   Final    NO GROWTH 2 DAYS Performed at [facility], [address] [place], [place] [address]    Report Status PENDING  Incomplete      Studies: ECHOCARDIOGRAM COMPLETE  Result Date: 05/21/2019    ECHOCARDIOGRAM REPORT   Patient Name:   [name] Date of Exam: 05/21/2019 Medical Rec #:  [id number]          Height:       73.0 in Accession #:    [id number]         Weight:       170.7 lb Date of Birth:  [birth year]           BSA:          2.012 m Patient Age:    49 years           BP:           111/72 mmHg Patient Gender: M                  HR:           88 bpm. Exam Location:  Inpatient Procedure: 2D Echo Indications:    Bacteremia 790.7 / R78.81  History:        Patient has prior history of Echocardiogram examinations, most                 recent 06/01/2017. CHF; Risk Factors:Former Smoker and Diabetes.  HIV, Acute respiratory failure with hypoxia , Pneumonia.  Sonographer:    Leavy Cella Referring Phys: [id number] [place]  1. Left ventricular ejection fraction, by estimation, is 60 to 65%. The left ventricle has normal function. The left ventricle has no regional wall motion abnormalities. Left ventricular diastolic function could not be evaluated.  2. Right ventricular systolic function is normal. The right ventricular size is normal.  3. The mitral valve is normal in structure and function. No evidence of mitral valve regurgitation. No evidence of mitral stenosis.  4. The aortic valve is normal in structure and function. Aortic valve regurgitation is not  visualized. No aortic stenosis is present.  5. The inferior vena cava is normal in size with greater than 50% respiratory variability, suggesting right atrial pressure of 3 mmHg. Conclusion(s)/Recommendation(s): No evidence of valvular vegetations on this transthoracic echocardiogram. Would recommend a transesophageal echocardiogram to exclude infective endocarditis if clinically indicated. FINDINGS  Left Ventricle: Excessive interventricular septum displacement with respiration suggests increased work of breathing. No other findings are present to suggest constrictive physiology. Left ventricular ejection fraction, by estimation, is 60 to 65%. The left ventricle has normal function. The left ventricle has no regional wall motion abnormalities. The left ventricular internal cavity size was normal in size. There is no left ventricular hypertrophy. Left ventricular diastolic function could not be evaluated. Right Ventricle: The right ventricular size is normal. No increase in right ventricular wall thickness. Right ventricular systolic function is normal. Left Atrium: Left atrial size was normal in size. Right Atrium: Right atrial size was normal in size. Pericardium: There is no evidence of pericardial effusion. Mitral Valve: The mitral valve is normal in structure and function. Normal mobility of the mitral valve leaflets. No evidence of mitral valve regurgitation. No evidence of mitral valve stenosis. Tricuspid Valve: The tricuspid valve is normal in structure. Tricuspid valve regurgitation is not demonstrated. No evidence of tricuspid stenosis. Aortic Valve: The aortic valve is normal in structure and function. Aortic valve regurgitation is not visualized. No aortic stenosis is present. Pulmonic Valve: The pulmonic valve was normal in structure. Pulmonic valve regurgitation is not visualized. No evidence of pulmonic stenosis. Aorta: The aortic root is normal in size and structure. Venous: The inferior vena cava is  normal in size with greater than 50% respiratory variability, suggesting right atrial pressure of 3 mmHg. IAS/Shunts: No atrial level shunt detected by color flow Doppler. Mihai Croitoru MD Electronically signed by Sanda Klein MD Signature Date/Time: 05/21/2019/11:57:00 AM    Final     Scheduled Meds: . Darunavir-Cobicisctat-Emtricitabine-Tenofovir Alafenamide  1 tablet Oral Q breakfast  . dolutegravir  50 mg Oral Daily  . enoxaparin (LOVENOX) injection  40 mg Subcutaneous Q24H  . ferrous sulfate  325 mg Oral Q breakfast  . guaiFENesin  600 mg Oral BID  . insulin aspart  0-9 Units Subcutaneous TID WC  . sulfamethoxazole-trimethoprim  1 tablet Oral Daily    Continuous Infusions: .  ceFAZolin (ANCEF) IV 2 g (05/21/19 1319)     LOS: 4 days     Alma Friendly, MD [facility]  If 7PM-7AM, please contact night-coverage [email or web address] 05/21/2019, 1:37 PM

## 2019-05-21 NOTE — Progress Notes (Signed)
*  PRELIMINARY RESULTS* Echocardiogram 2D Echocardiogram has been performed.  [name] 05/21/2019, 11:44 AM

## 2019-05-21 NOTE — Plan of Care (Signed)
°  Problem: Coping: °Goal: Level of anxiety will decrease °Outcome: Progressing °  °

## 2019-05-22 DIAGNOSIS — J15211 Pneumonia due to Methicillin susceptible Staphylococcus aureus: Secondary | ICD-10-CM

## 2019-05-22 LAB — BASIC METABOLIC PANEL
Anion gap: 9 (ref 5–15)
BUN: 31 mg/dL — ABNORMAL HIGH (ref 6–20)
CO2: 23 mmol/L (ref 22–32)
Calcium: 9.2 mg/dL (ref 8.9–10.3)
Chloride: 102 mmol/L (ref 98–111)
Creatinine, Ser: 1.36 mg/dL — ABNORMAL HIGH (ref 0.61–1.24)
GFR calc Af Amer: >60 mL/min (ref >60)
GFR calc non Af Amer: >60 mL/min (ref >60)
Glucose, Bld: 150 mg/dL — ABNORMAL HIGH (ref 70–99)
Potassium: 4.4 mmol/L (ref 3.5–5.1)
Sodium: 134 mmol/L — ABNORMAL LOW (ref 135–145)

## 2019-05-22 LAB — CBC WITH DIFFERENTIAL/PLATELET
Abs Immature Granulocytes: 0.12 10*3/uL — ABNORMAL HIGH (ref 0.00–0.07)
Basophils Absolute: 0 10*3/uL (ref 0.0–0.1)
Basophils Relative: 0 %
Eosinophils Absolute: 0 10*3/uL (ref 0.0–0.5)
Eosinophils Relative: 0 %
HCT: 33.3 % — ABNORMAL LOW (ref 39.0–52.0)
Hemoglobin: 10.2 g/dL — ABNORMAL LOW (ref 13.0–17.0)
Immature Granulocytes: 1 %
Lymphocytes Relative: 29 %
Lymphs Abs: 3.3 10*3/uL (ref 0.7–4.0)
MCH: 25.1 pg — ABNORMAL LOW (ref 26.0–34.0)
MCHC: 30.6 g/dL (ref 30.0–36.0)
MCV: 81.8 fL (ref 80.0–100.0)
Monocytes Absolute: 1.2 10*3/uL — ABNORMAL HIGH (ref 0.1–1.0)
Monocytes Relative: 11 %
Neutro Abs: 6.7 10*3/uL (ref 1.7–7.7)
Neutrophils Relative %: 59 %
Platelets: 406 10*3/uL — ABNORMAL HIGH (ref 150–400)
RBC: 4.07 MIL/uL — ABNORMAL LOW (ref 4.22–5.81)
RDW: 25.8 % — ABNORMAL HIGH (ref 11.5–15.5)
WBC: 11.5 10*3/uL — ABNORMAL HIGH (ref 4.0–10.5)
nRBC: 0 % (ref 0.0–0.2)

## 2019-05-22 LAB — CULTURE, BLOOD (ROUTINE X 2)
Culture: NO GROWTH
Special Requests: ADEQUATE

## 2019-05-22 LAB — GLUCOSE, CAPILLARY
Glucose-Capillary: 153 mg/dL — ABNORMAL HIGH (ref 70–99)
Glucose-Capillary: 157 mg/dL — ABNORMAL HIGH (ref 70–99)
Glucose-Capillary: 209 mg/dL — ABNORMAL HIGH (ref 70–99)
Glucose-Capillary: 180 mg/dL — ABNORMAL HIGH (ref 70–99)

## 2019-05-22 NOTE — Progress Notes (Signed)
PROGRESS NOTE  [name]:[id number] DOB: 06-22-1970 DOA: 05/17/2019 PCP: [name], MD  HPI/Recap of past 24 hours: HPI from Dr. Alinda Deem [name] is a 49 y.o. 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 Patient has been having approximately 1 month of progressively worsening dry cough and shortness of breath with bilateral lower chest pain.  He has been having intermittent fevers, chills, and lower extremity swelling.  He says he has not been taking his antiretroviral medications for about 6 months now. In the ED, BP 116/73, pulse 124, RR 16, temp 99.9 Fahrenheit, SPO2 94% on room air.  SPO2 decreased to 90% and patient was placed on 3 L supplemental O2 via [place]. Labs are notable for BUN 15, creatinine 1.25, WBC 9.9, hemoglobin 8.4. COVID-19 negative. CTA chest PE study is negative for acute PE. Extensive left upper lobe consolidation is noted as well as chronic lung changes bilaterally with moderate emphysema and extensive bronchiectasis with mucous plugging in the left lower lobe. EDP discussed the case with on-call infectious disease who recommended treating for suspected pneumocystis pneumonia.  Patient was given IV ceftriaxone, azithromycin, and started on IV Bactrim.  Patient was also given prednisone 40 mg orally once.  The hospitalist service was consulted admit for further evaluation management.     Today, patient denies any worsening cough, fever/chills, chest pain, abdominal pain, nausea/vomiting.  Patient still reports minimal back pain.     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 Extensive left upper lobe consolidation is noted as well as chronic lung changes bilaterally with moderate emphysema and extensive bronchiectasis with mucous plugging in the left lower lobe ID on board, appreciate recs Continue IV cefazolin, Bactrim Continue supplemental oxygen as needed  MSSA bacteremia BC growing MSSA Repeat BC X 2, NGTD TTE showed EF of 60-65%, no evidence of vegetation, cardiology consulted for TEE, plan for 05/23/19 MRI lumbar to r/o discitis as pt c/o back pain  ID on board, continue IV cefazolin  AKI Mild bump in creatinine Daily BMP  HIV Noncompliant with antiretroviral meds Repeat CD4-->6/64, Viral load 840 ID on board, restart antiretrovirals  Anemia of chronic disease (HIV) Hemoglobin around baseline Anemia panel showed iron 20, sats 6, ferritin 47, folate 12.5, vitamin B12 607 Oral iron supplementation Daily CBC  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 Byrd Hesselbach at [facility] Continue ostomy care  Type 2 diabetes mellitus A1c 5.4 SSI, Accu-Cheks, hypoglycemic protocol Patient not taking home Janumet and glipizide per report         Malnutrition Type:      Malnutrition Characteristics:      Nutrition Interventions:       Estimated body mass index is 22.52 kg/m as calculated from the following:   Height as of this encounter: 6\' 1"  (1.854 m).   Weight as of this encounter: 77.4 kg.     Code Status: Full  Family Communication: Discussed with patient extensively   Disposition Plan: Likely home once work up is complete, pending TEE, ID sign off   Consultants:  ID  Cardiology  Procedures:  None  Antimicrobials:  Cefazolin  Bactrim  DVT  prophylaxis: Lovenox   Objective: Vitals:   05/21/19 2109 05/22/19 0500 05/22/19 0855 05/22/19 1655  BP: 103/64 112/66 103/70 99/68  Pulse: 85 81 84 91  Resp: 18 18 18 18   Temp: 98.1 F (36.7 C) 97.6 F (36.4 C) (!) 97.5 F (36.4 C) (!) 97.5 F (36.4 C)  TempSrc: Oral Oral Oral Oral  SpO2: 99% 99% 94% 97%  Weight: 77.4 kg     Height:        Intake/Output Summary (Last 24 hours) at 05/22/2019 1711 Last data filed at 05/22/2019 1616 Gross per 24 hour  Intake 1140 ml  Output 1675 ml  Net -535 ml   Filed Weights   05/18/19 2058 05/20/19 2041 05/21/19 2109  Weight: 77.4 kg 77.4 kg 77.4 kg    Exam:  General: NAD   Cardiovascular: S1, S2 present  Respiratory:  Diminished breath sounds bilaterally  Abdomen: Soft, nontender, nondistended, bowel sounds present, ostomy bag noted LLQ present  Musculoskeletal: No bilateral pedal edema noted  Skin: Normal  Psychiatry: Normal mood   Data Reviewed: CBC: Recent Labs  Lab 05/17/19 1203 05/17/19 1203 05/18/19 0500 05/19/19 0552 05/20/19 0426 05/21/19 0547 05/22/19 0436  WBC 9.9   < > 7.2 11.7* 11.0* 10.6* 11.5*  NEUTROABS 5.6  --   --  8.9* 8.4* 6.5 6.7  HGB 8.4*   < > 8.1* 8.0* 8.9* 9.5* 10.2*  HCT 28.5*   < > 28.5* 27.3* 30.4* 31.9* 33.3*  MCV 82.4   < > 85.3 83.5 83.5 83.1 81.8  PLT 219   < > 213 242 304 365 406*   < > = values in this interval not displayed.   Basic Metabolic Panel: Recent Labs  Lab 05/18/19 0500 05/19/19 0552 05/20/19 0426 05/21/19 0547 05/22/19 0600  NA 136 135 133* 134* 134*  K 4.5 4.3 4.7 4.3 4.4  CL 105 103 102 101 102  CO2 24 22 22 24 23   GLUCOSE 169* 136* 192* 207* 150*  BUN 13 15 23* 24* 31*  CREATININE 1.12 1.10 1.29* 1.23 1.36*  CALCIUM 8.6* 8.9 9.2 9.3 9.2   GFR: Estimated Creatinine Clearance: 72.7 mL/min (A) (by C-G formula based on SCr of 1.36 mg/dL (H)). Liver Function Tests: Recent Labs  Lab 05/17/19 1203  AST 24  ALT 13  ALKPHOS 61  BILITOT 0.8  PROT 9.1*    ALBUMIN 2.5*   No results for input(s): LIPASE, AMYLASE in the last 168 hours. No results for input(s): AMMONIA in the last 168 hours. Coagulation Profile: Recent Labs  Lab 05/17/19 1203  INR 1.1   Cardiac Enzymes: No results for input(s): CKTOTAL, CKMB, CKMBINDEX, TROPONINI in the last 168 hours. BNP (last 3 results) No results for input(s): PROBNP in the last 8760 hours. HbA1C: No results for input(s): HGBA1C in the last 72 hours. CBG: Recent Labs  Lab 05/21/19 1612 05/21/19 2110 05/22/19 0716 05/22/19 1120 05/22/19 1621  GLUCAP 256* 279* 153* 157* 209*   Lipid Profile: No results for input(s): CHOL, HDL, LDLCALC, TRIG, CHOLHDL, LDLDIRECT in the last 72 hours. Thyroid Function Tests: No results for input(s): TSH, T4TOTAL, FREET4, T3FREE, THYROIDAB in the last 72 hours. Anemia Panel: No results for input(s): VITAMINB12, FOLATE, FERRITIN, TIBC, IRON, RETICCTPCT in the last  72 hours. Urine analysis:    Component Value Date/Time   COLORURINE YELLOW 05/17/2019 1556   APPEARANCEUR CLEAR 05/17/2019 1556   LABSPEC 1.020 05/17/2019 1556   PHURINE 6.0 05/17/2019 1556   GLUCOSEU NEGATIVE 05/17/2019 1556   HGBUR NEGATIVE 05/17/2019 1556   BILIRUBINUR NEGATIVE 05/17/2019 1556   KETONESUR NEGATIVE 05/17/2019 1556   PROTEINUR 30 (A) 05/17/2019 1556   UROBILINOGEN 1.0 03/23/2014 1400   NITRITE NEGATIVE 05/17/2019 1556   LEUKOCYTESUR NEGATIVE 05/17/2019 1556   Sepsis Labs: @LABRCNTIP (procalcitonin:4,lacticidven:4)  ) Recent Results (from the past 240 hour(s))  Culture, blood (Routine x 2)     Status: None   Collection Time: 05/17/19 12:23 PM   Specimen: BLOOD  Result Value Ref Range Status   Specimen Description BLOOD RIGHT ANTECUBITAL  Final   Special Requests   Final    BOTTLES DRAWN AEROBIC AND ANAEROBIC Blood Culture adequate volume   Culture   Final    NO GROWTH 5 DAYS Performed at [facility], [address], [place], [place]    Report Status  05/22/2019 FINAL  Final  Culture, blood (Routine x 2)     Status: Abnormal   Collection Time: 05/17/19  3:45 PM   Specimen: BLOOD RIGHT ARM  Result Value Ref Range Status   Specimen Description BLOOD RIGHT ARM  Final   Special Requests   Final    BOTTLES DRAWN AEROBIC AND ANAEROBIC Blood Culture results may not be optimal due to an inadequate volume of blood received in culture bottles   Culture  Setup Time   Final    GRAM POSITIVE COCCI IN CLUSTERS ANAEROBIC BOTTLE ONLY CRITICAL RESULT CALLED TO, READ BACK BY AND VERIFIED WITH: J. STEENWYK, PHARMD AT 0830 ON 05/18/19 BY C. JESSUP, MT. Performed at [facility], [address], [place], [place]    Culture STAPHYLOCOCCUS AUREUS (A)  Final   Report Status 05/20/2019 FINAL  Final   Organism ID, Bacteria STAPHYLOCOCCUS AUREUS  Final      Susceptibility   Staphylococcus aureus - MIC*    CIPROFLOXACIN >=8 RESISTANT Resistant     ERYTHROMYCIN >=8 RESISTANT Resistant     GENTAMICIN <=0.5 SENSITIVE Sensitive     OXACILLIN <=0.25 SENSITIVE Sensitive     TETRACYCLINE <=1 SENSITIVE Sensitive     VANCOMYCIN <=0.5 SENSITIVE Sensitive     TRIMETH/SULFA >=320 RESISTANT Resistant     CLINDAMYCIN RESISTANT Resistant     RIFAMPIN <=0.5 SENSITIVE Sensitive     Inducible Clindamycin POSITIVE Resistant     * STAPHYLOCOCCUS AUREUS  SARS CORONAVIRUS 2 (TAT 6-24 HRS) Nasopharyngeal Nasopharyngeal Swab     Status: None   Collection Time: 05/17/19  7:48 PM   Specimen: Nasopharyngeal Swab  Result Value Ref Range Status   SARS Coronavirus 2 NEGATIVE NEGATIVE Final    Comment: (NOTE) SARS-CoV-2 target nucleic acids are NOT DETECTED. The SARS-CoV-2 RNA is generally detectable in upper and lower respiratory specimens during the acute phase of infection. Negative results do not preclude SARS-CoV-2 infection, do not rule out co-infections with other pathogens, and should not be used as the sole basis for treatment or other patient management  decisions. Negative results must be combined with clinical observations, patient history, and epidemiological information. The expected result is Negative. Fact Sheet for Patients: 07/17/19 Fact Sheet for Healthcare Providers: HairSlick.no This test is not yet approved or cleared by the quierodirigir.com FDA and  has been authorized for detection and/or diagnosis of SARS-CoV-2 by  FDA under an Emergency Use Authorization (EUA). This EUA will remain  in effect (meaning this test can be used) for the duration of the COVID-19 declaration under Section 56 4(b)(1) of the Act, 21 U.S.C. section 360bbb-3(b)(1), unless the authorization is terminated or revoked sooner. Performed at [facility], [address], [place], [place] [address]   Expectorated sputum assessment w rflx to resp cult     Status: None   Collection Time: 05/17/19  7:57 PM   Specimen: Expectorated Sputum  Result Value Ref Range Status   Specimen Description EXPECTORATED SPUTUM  Final   Special Requests NONE  Final   Sputum evaluation   Final    Sputum specimen not acceptable for testing.  Please recollect.   RESULT CALLED TO, READ BACK BY AND VERIFIED WITH: Jerelyn Scott RN 05/17/19 2112 JDW Performed at [facility], [address], [place], [place] [address]    Report Status 05/17/2019 FINAL  Final  Pneumocystis smear by DFA     Status: None   Collection Time: 05/17/19  7:57 PM   Specimen: Sputum; Respiratory  Result Value Ref Range Status   Specimen Source-PJSRC EXPECTORATED SPUTUM  Final   Pneumocystis jiroveci Ag NEGATIVE  Final    Comment: Performed at [facility] Performed at [facility], [address] [place], [place] [address]   Culture, blood (routine x 2)     Status: None (Preliminary result)   Collection Time: 05/19/19  4:39 PM   Specimen: BLOOD RIGHT HAND  Result Value Ref Range Status   Specimen Description BLOOD RIGHT  HAND  Final   Special Requests   Final    BOTTLES DRAWN AEROBIC ONLY Blood Culture adequate volume   Culture   Final    NO GROWTH 3 DAYS Performed at [facility], [address] [place], [place] [address]    Report Status PENDING  Incomplete  Culture, blood (routine x 2)     Status: None (Preliminary result)   Collection Time: 05/19/19  4:43 PM   Specimen: BLOOD LEFT HAND  Result Value Ref Range Status   Specimen Description BLOOD LEFT HAND  Final   Special Requests   Final    BOTTLES DRAWN AEROBIC ONLY Blood Culture adequate volume   Culture   Final    NO GROWTH 3 DAYS Performed at [facility], [address], [place], [place] [address]    Report Status PENDING  Incomplete      Studies: No results found.  Scheduled Meds: . Darunavir-Cobicisctat-Emtricitabine-Tenofovir Alafenamide  1 tablet Oral Q breakfast  . dolutegravir  50 mg Oral Daily  . enoxaparin (LOVENOX) injection  40 mg Subcutaneous Q24H  . ferrous sulfate  325 mg Oral Q breakfast  . guaiFENesin  600 mg Oral BID  . insulin aspart  0-9 Units Subcutaneous TID WC  . sulfamethoxazole-trimethoprim  1 tablet Oral Daily    Continuous Infusions: .  ceFAZolin (ANCEF) IV 2 g (05/22/19 1313)     LOS: 5 days     Anthony Cedar, MD [facility]  If 7PM-7AM, please contact night-coverage [email or web address] 05/22/2019, 5:11 PM

## 2019-05-22 NOTE — Progress Notes (Signed)
Pharmacy Antibiotic Note  [name] is a 49 y.o. male admitted on 05/17/2019 with acute respiratory failure with concern for CAP and pneumocystis pneumonia. PMH significant for HIV, has been off ART for 6 months, CD4 count very low 64 on admission. Blood cx is growing 1/4 MSSA. Pharmacy consulted to dose cefazolin.  Pt is afebrile, WBC elevated at 11.5. Renal function is stable. TTE neg for vegetations. TEE pending. Plan is for minimum of 2 weeks abx.  Plan: Continue cefazolin 2 gm IV q8h Bactrim DS PO for PJP prophylaxis per ID F/u c/s, renal function, and clinical improvement ID team following  Height: 6\' 1"  (185.4 cm) Weight: 170 lb 11.1 oz (77.4 kg) IBW/kg (Calculated) : 79.9  Temp (24hrs), Avg:97.8 F (36.6 C), Min:97.5 F (36.4 C), Max:98.1 F (36.7 C)  Recent Labs  Lab 05/17/19 1203 05/17/19 1204 05/17/19 1545 05/18/19 0500 05/19/19 0552 05/20/19 0426 05/21/19 0547 05/22/19 0436 05/22/19 0600  WBC   < >  --   --  7.2 11.7* 11.0* 10.6* 11.5*  --   CREATININE   < >  --   --  1.12 1.10 1.29* 1.23  --  1.36*  LATICACIDVEN  --  1.1 1.3  --   --   --   --   --   --    < > = values in this interval not displayed.    Estimated Creatinine Clearance: 72.7 mL/min (A) (by C-G formula based on SCr of 1.36 mg/dL (H)).    No Known Allergies  Antimicrobials this admission: 3/3 ceftriaxone >>3/4 3/3 azithromycin >>3/4 3/3 bactrimIV>>3/4 PO for PJP prophylaxis >> 3/4 Vanc >> 3/6 3/4 cefazolin >>  Microbiology results: 3/3 BCx - GPC in clusters in 1/4 - MSSA 3/3 PCP - neg 3/3 strep pneumo - neg 3/5 BCx: ngtd  Thank you for involving pharmacy in this patient's care.  07/22/19, PharmD, BCPS Clinical Pharmacist Clinical phone for 05/22/2019 until 3p is [phone] 05/22/2019 9:04 AM  **Pharmacist phone directory can be found on [email or web address] listed under [facility]**

## 2019-05-22 NOTE — Progress Notes (Signed)
   [facility] has been requested to perform a transesophageal echocardiogram on [name] for bacteremia.  After careful review of history and examination, the risks and benefits of transesophageal echocardiogram have been explained including risks of esophageal damage, perforation (1:10,000 risk), bleeding, pharyngeal hematoma as well as other potential complications associated with conscious sedation including aspiration, arrhythmia, respiratory failure and death. Alternatives to treatment were discussed, questions were answered. Patient is willing to proceed.   Procedure is scheduled for 05/23/2019 at 13:30pm with Dr. Duke Salvia. Will place orders.  Labs reviewed. Hemoglobin stable at 10.2.   Of note, patient speaks [name]. I used [employer] while consenting patient. Interpreter's name Jerolyn Center [phone]). Patient reported that he did not know how to write. Therefore, had RN Norman Clay) witness consent and we both signed consent form and placed form in paper chart.   Corrin Parker, PA-C 05/22/2019 3:35 PM

## 2019-05-22 NOTE — Progress Notes (Signed)
[place] for Infectious Disease    Date of Admission:  05/17/2019   Total days of antibiotics 6           ID: [name] is a 49 y.o. 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  Medications:  . Darunavir-Cobicisctat-Emtricitabine-Tenofovir Alafenamide  1 tablet Oral Q breakfast  . dolutegravir  50 mg Oral Daily  . enoxaparin (LOVENOX) injection  40 mg Subcutaneous Q24H  . ferrous sulfate  325 mg Oral Q breakfast  . guaiFENesin  600 mg Oral BID  . insulin aspart  0-9 Units Subcutaneous TID WC  . sulfamethoxazole-trimethoprim  1 tablet Oral Daily    Objective: Vital signs in last 24 hours: Temp:  [97.5 F (36.4 C)-98.1 F (36.7 C)] 97.5 F (36.4 C) (03/08 1655) Pulse Rate:  [81-91] 91 (03/08 1655) Resp:  [18] 18 (03/08 1655) BP: (99-112)/(64-70) 99/68 (03/08 1655) SpO2:  [94 %-99 %] 97 % (03/08 1655) Weight:  [77.4 kg] 77.4 kg (03/07 2109) Physical Exam  Constitutional: He is oriented to person, place, and time. He appears well-developed and well-nourished. No distress.  HENT:  Mouth/Throat: Oropharynx is clear and moist. No oropharyngeal exudate.  Cardiovascular: Normal rate, regular rhythm and normal heart sounds. Exam reveals no gallop and no friction rub.  No murmur heard.  Pulmonary/Chest: Effort normal and breath sounds normal. No respiratory distress. He has no wheezes.  Abdominal: Soft. Bowel sounds are normal. He exhibits no distension. There is no tenderness. Ostomy in place Lymphadenopathy:  He has no cervical adenopathy.  Neurological: He is alert and oriented to person, place, and time.  Skin: Skin is warm and dry. No rash noted. No erythema.  Psychiatric: He has a normal mood and affect. His behavior  is normal.     Lab Results Recent Labs    05/21/19 0547 05/22/19 0436 05/22/19 0600  WBC 10.6* 11.5*  --   HGB 9.5* 10.2*  --   HCT 31.9* 33.3*  --   NA 134*  --  134*  K 4.3  --  4.4  CL 101  --  102  CO2 24  --  23  BUN 24*  --  31*  CREATININE 1.23  --  1.36*    Microbiology: reviewed Studies/Results: ECHOCARDIOGRAM COMPLETE  Result Date: 05/21/2019    ECHOCARDIOGRAM REPORT   Patient Name:   [name] Date of Exam: 05/21/2019 Medical Rec #:  [id number]          Height:       73.0 in Accession #:    [id number]         Weight:       170.7 lb Date of Birth:  15-Mar-1971           BSA:          2.012 m Patient Age:    5 years           BP:           111/72 mmHg Patient Gender: M                  HR:           88 bpm. Exam Location:  Inpatient Procedure: 2D Echo Indications:  Bacteremia 790.7 / R78.81  History:        Patient has prior history of Echocardiogram examinations, most                 recent 06/01/2017. CHF; Risk Factors:Former Smoker and Diabetes.                 HIV, Acute respiratory failure with hypoxia , Pneumonia.  Sonographer:    Jeryl Columbia Referring Phys: [id number] NKEIRUKA J EZENDUKA IMPRESSIONS  1. Left ventricular ejection fraction, by estimation, is 60 to 65%. The left ventricle has normal function. The left ventricle has no regional wall motion abnormalities. Left ventricular diastolic function could not be evaluated.  2. Right ventricular systolic function is normal. The right ventricular size is normal.  3. The mitral valve is normal in structure and function. No evidence of mitral valve regurgitation. No evidence of mitral stenosis.  4. The aortic valve is normal in structure and function. Aortic valve regurgitation is not visualized. No aortic stenosis is present.  5. The inferior vena cava is normal in size with greater than 50% respiratory variability, suggesting right atrial pressure of 3 mmHg. Conclusion(s)/Recommendation(s): No evidence of valvular  vegetations on this transthoracic echocardiogram. Would recommend a transesophageal echocardiogram to exclude infective endocarditis if clinically indicated. FINDINGS  Left Ventricle: Excessive interventricular septum displacement with respiration suggests increased work of breathing. No other findings are present to suggest constrictive physiology. Left ventricular ejection fraction, by estimation, is 60 to 65%. The left ventricle has normal function. The left ventricle has no regional wall motion abnormalities. The left ventricular internal cavity size was normal in size. There is no left ventricular hypertrophy. Left ventricular diastolic function could not be evaluated. Right Ventricle: The right ventricular size is normal. No increase in right ventricular wall thickness. Right ventricular systolic function is normal. Left Atrium: Left atrial size was normal in size. Right Atrium: Right atrial size was normal in size. Pericardium: There is no evidence of pericardial effusion. Mitral Valve: The mitral valve is normal in structure and function. Normal mobility of the mitral valve leaflets. No evidence of mitral valve regurgitation. No evidence of mitral valve stenosis. Tricuspid Valve: The tricuspid valve is normal in structure. Tricuspid valve regurgitation is not demonstrated. No evidence of tricuspid stenosis. Aortic Valve: The aortic valve is normal in structure and function. Aortic valve regurgitation is not visualized. No aortic stenosis is present. Pulmonic Valve: The pulmonic valve was normal in structure. Pulmonic valve regurgitation is not visualized. No evidence of pulmonic stenosis. Aorta: The aortic root is normal in size and structure. Venous: The inferior vena cava is normal in size with greater than 50% respiratory variability, suggesting right atrial pressure of 3 mmHg. IAS/Shunts: No atrial level shunt detected by color flow Doppler. Thurmon Fair MD Electronically signed by Thurmon Fair MD  Signature Date/Time: 05/21/2019/11:57:00 AM    Final      Assessment/Plan: MSSA pneumonia with secondary bacteremia = continue on cefazolin 2gm IV q 8hr. Will be getting TEE to rule out endocarditis then we can make plans for his length of therapy  HIV disease= we have restarted him on tivicay plus symtuza  oi proph = continue on bactrim ds 1 tablet daily  [facility] for Infectious Diseases Cell: [phone] Pager: [phone]  05/22/2019, 6:34 PM

## 2019-05-23 ENCOUNTER — Inpatient Hospital Stay (HOSPITAL_COMMUNITY): Payer: Self-pay

## 2019-05-23 ENCOUNTER — Inpatient Hospital Stay (HOSPITAL_COMMUNITY): Payer: Self-pay | Admitting: Anesthesiology

## 2019-05-23 ENCOUNTER — Encounter (HOSPITAL_COMMUNITY)
Admission: EM | Disposition: A | Discharge status: Home / Self Care | Payer: Self-pay | Source: Home / Self Care | Attending: Internal Medicine

## 2019-05-23 ENCOUNTER — Encounter (HOSPITAL_COMMUNITY): Payer: Self-pay | Admitting: Internal Medicine

## 2019-05-23 DIAGNOSIS — R7881 Bacteremia: Secondary | ICD-10-CM

## 2019-05-23 DIAGNOSIS — B9561 Methicillin susceptible Staphylococcus aureus infection as the cause of diseases classified elsewhere: Secondary | ICD-10-CM

## 2019-05-23 DIAGNOSIS — I340 Nonrheumatic mitral (valve) insufficiency: Secondary | ICD-10-CM

## 2019-05-23 HISTORY — PX: TEE WITHOUT CARDIOVERSION: SHX5443

## 2019-05-23 LAB — GLUCOSE, CAPILLARY
Glucose-Capillary: 116 mg/dL — ABNORMAL HIGH (ref 70–99)
Glucose-Capillary: 133 mg/dL — ABNORMAL HIGH (ref 70–99)
Glucose-Capillary: 123 mg/dL — ABNORMAL HIGH (ref 70–99)
Glucose-Capillary: 204 mg/dL — ABNORMAL HIGH (ref 70–99)
Glucose-Capillary: 152 mg/dL — ABNORMAL HIGH (ref 70–99)

## 2019-05-23 LAB — BASIC METABOLIC PANEL
Anion gap: 10 (ref 5–15)
BUN: 34 mg/dL — ABNORMAL HIGH (ref 6–20)
CO2: 24 mmol/L (ref 22–32)
Calcium: 9 mg/dL (ref 8.9–10.3)
Chloride: 100 mmol/L (ref 98–111)
Creatinine, Ser: 1.39 mg/dL — ABNORMAL HIGH (ref 0.61–1.24)
GFR calc Af Amer: >60 mL/min (ref >60)
GFR calc non Af Amer: 60 mL/min — ABNORMAL LOW (ref >60)
Glucose, Bld: 133 mg/dL — ABNORMAL HIGH (ref 70–99)
Potassium: 4.1 mmol/L (ref 3.5–5.1)
Sodium: 134 mmol/L — ABNORMAL LOW (ref 135–145)

## 2019-05-23 LAB — CBC WITH DIFFERENTIAL/PLATELET
Abs Immature Granulocytes: 0.18 10*3/uL — ABNORMAL HIGH (ref 0.00–0.07)
Basophils Absolute: 0 10*3/uL (ref 0.0–0.1)
Basophils Relative: 0 %
Eosinophils Absolute: 0.3 10*3/uL (ref 0.0–0.5)
Eosinophils Relative: 3 %
HCT: 33.7 % — ABNORMAL LOW (ref 39.0–52.0)
Hemoglobin: 10 g/dL — ABNORMAL LOW (ref 13.0–17.0)
Immature Granulocytes: 2 %
Lymphocytes Relative: 35 %
Lymphs Abs: 2.7 10*3/uL (ref 0.7–4.0)
MCH: 25 pg — ABNORMAL LOW (ref 26.0–34.0)
MCHC: 29.7 g/dL — ABNORMAL LOW (ref 30.0–36.0)
MCV: 84.3 fL (ref 80.0–100.0)
Monocytes Absolute: 1 10*3/uL (ref 0.1–1.0)
Monocytes Relative: 13 %
Neutro Abs: 3.5 10*3/uL (ref 1.7–7.7)
Neutrophils Relative %: 47 %
Platelets: 404 10*3/uL — ABNORMAL HIGH (ref 150–400)
RBC: 4 MIL/uL — ABNORMAL LOW (ref 4.22–5.81)
RDW: 24.9 % — ABNORMAL HIGH (ref 11.5–15.5)
WBC: 7.4 10*3/uL (ref 4.0–10.5)
nRBC: 0 % (ref 0.0–0.2)

## 2019-05-23 SURGERY — ECHOCARDIOGRAM, TRANSESOPHAGEAL
Anesthesia: Monitor Anesthesia Care

## 2019-05-23 MED ORDER — PHENYLEPHRINE 40 MCG/ML (10ML) SYRINGE FOR IV PUSH (FOR BLOOD PRESSURE SUPPORT)
PREFILLED_SYRINGE | INTRAVENOUS | Status: DC | PRN
  Administered 2019-05-23: 120 ug via INTRAVENOUS
  Administered 2019-05-23: 40 ug via INTRAVENOUS
  Administered 2019-05-23 (×2): 120 ug via INTRAVENOUS

## 2019-05-23 MED ORDER — LIDOCAINE 2% (20 MG/ML) 5 ML SYRINGE
INTRAMUSCULAR | Status: DC | PRN
  Administered 2019-05-23: 80 mg via INTRAVENOUS

## 2019-05-23 MED ORDER — PROPOFOL 10 MG/ML IV BOLUS
INTRAVENOUS | Status: DC | PRN
  Administered 2019-05-23: 20 mg via INTRAVENOUS
  Administered 2019-05-23: 15 mg via INTRAVENOUS

## 2019-05-23 MED ORDER — SODIUM CHLORIDE 0.9 % IV BOLUS
1000.0000 mL | Freq: Once | INTRAVENOUS | Status: AC
  Administered 2019-05-23: 1000 mL via INTRAVENOUS

## 2019-05-23 MED ORDER — GLYCOPYRROLATE PF 0.2 MG/ML IJ SOSY
PREFILLED_SYRINGE | INTRAMUSCULAR | Status: DC | PRN
  Administered 2019-05-23: .1 mg via INTRAVENOUS

## 2019-05-23 MED ORDER — PROPOFOL 500 MG/50ML IV EMUL
INTRAVENOUS | Status: DC | PRN
  Administered 2019-05-23: 120 ug/kg/min via INTRAVENOUS

## 2019-05-23 MED ORDER — FENTANYL CITRATE (PF) 100 MCG/2ML IJ SOLN
INTRAMUSCULAR | Status: DC | PRN
  Administered 2019-05-23 (×2): 25 ug via INTRAVENOUS

## 2019-05-23 MED ORDER — GADOBUTROL 1 MMOL/ML IV SOLN
7.5000 mL | Freq: Once | INTRAVENOUS | Status: AC | PRN
  Administered 2019-05-23: 7.5 mL via INTRAVENOUS

## 2019-05-23 MED ORDER — LACTATED RINGERS IV SOLN: INTRAVENOUS | Status: DC | PRN

## 2019-05-23 NOTE — Anesthesia Procedure Notes (Signed)
Procedure Name: MAC Date/Time: 05/23/2019 1:33 PM Performed by: Janace Litten, CRNA Pre-anesthesia Checklist: Patient identified, Emergency Drugs available, Suction available and Patient being monitored Patient Re-evaluated:Patient Re-evaluated prior to induction Oxygen Delivery Method: Nasal cannula Induction Type: IV induction

## 2019-05-23 NOTE — Progress Notes (Signed)
  Echocardiogram Echocardiogram Transesophageal has been performed.  Janalyn Harder 05/23/2019, 2:01 PM

## 2019-05-23 NOTE — CV Procedure (Signed)
Brief TEE Note  LVEF 55-60% Mild MR, trivial TR No LA/LAA thrombus or masses No evidence of endocarditis.  For additional details see full report.  Hanif Radin C. Duke Salvia, MD, [facility] 05/23/2019 1:53 PM

## 2019-05-23 NOTE — Anesthesia Preprocedure Evaluation (Signed)
Anesthesia Evaluation  Patient identified by MRN, date of birth, ID band Patient awake    Reviewed: Allergy & Precautions, NPO status , Patient's Chart, lab work & pertinent test results  History of Anesthesia Complications Negative for: history of anesthetic complications  Airway Mallampati: I  TM Distance: >3 FB Neck ROM: Full    Dental   Pulmonary pneumonia, COPD, former smoker,  H/o TB   Pulmonary exam normal        Cardiovascular +CHF (most recent EF 60-65%)  Normal cardiovascular exam     Neuro/Psych negative neurological ROS  negative psych ROS   GI/Hepatic negative GI ROS, (+) Hepatitis -  Endo/Other  diabetes  Renal/GU Renal InsufficiencyRenal disease  negative genitourinary   Musculoskeletal negative musculoskeletal ROS (+)   Abdominal   Peds  Hematology  (+) anemia , HIV,   Anesthesia Other Findings Bacteremia - eval for endocarditis  Reproductive/Obstetrics                             Anesthesia Physical Anesthesia Plan  ASA: III  Anesthesia Plan: MAC   Post-op Pain Management:    Induction: Intravenous  PONV Risk Score and Plan: Propofol infusion, TIVA and Treatment may vary due to age or medical condition  Airway Management Planned: Natural Airway, Nasal Cannula and Simple Face Mask  Additional Equipment: None  Intra-op Plan:   Post-operative Plan:   Informed Consent: I have reviewed the patients History and Physical, chart, labs and discussed the procedure including the risks, benefits and alternatives for the proposed anesthesia with the patient or authorized representative who has indicated his/her understanding and acceptance.       Plan Discussed with:   Anesthesia Plan Comments:         Anesthesia Quick Evaluation

## 2019-05-23 NOTE — Plan of Care (Signed)
  Problem: Pain Managment: Goal: General experience of comfort will improve Outcome: Progressing   Problem: Safety: Goal: Ability to remain free from injury will improve Outcome: Progressing   Problem: Skin Integrity: Goal: Risk for impaired skin integrity will decrease Outcome: Progressing   

## 2019-05-23 NOTE — Progress Notes (Signed)
PROGRESS NOTE  [name] MCN:[id number] DOB: 30-Jul-1970 DOA: 05/17/2019 PCP: Anthony La, MD  HPI/Recap of past 24 hours: HPI from [name] [name] is a 49 y.o. 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 Patient has been having approximately 1 month of progressively worsening dry cough and shortness of breath with bilateral lower chest pain.  He has been having intermittent fevers, chills, and lower extremity swelling.  He says he has not been taking his antiretroviral medications for about 6 months now. In the ED, BP 116/73, pulse 124, RR 16, temp 99.9 Fahrenheit, SPO2 94% on room air.  SPO2 decreased to 90% and patient was placed on 3 L supplemental O2 via [place]. Labs are notable for BUN 15, creatinine 1.25, WBC 9.9, hemoglobin 8.4. COVID-19 negative. CTA chest PE study is negative for acute PE. Extensive left upper lobe consolidation is noted as well as chronic lung changes bilaterally with moderate emphysema and extensive bronchiectasis with mucous plugging in the left lower lobe. EDP discussed the case with on-call infectious disease who recommended treating for suspected pneumocystis pneumonia.  Patient was given IV ceftriaxone, azithromycin, and started on IV Bactrim.  Patient was also given prednisone 40 mg orally once.  The hospitalist service was consulted admit for further evaluation management.     Today, patient denies any new complaints.  Shortly after TEE procedure, patient noted to be hypotensive, although remained asymptomatic.  Patient denies any chest pain, shortness of breath, abdominal pain, nausea/vomiting, fever/chills. Will give 1L bolus of IVF     Assessment/Plan: Principal Problem:   Acute respiratory failure with hypoxia ([name]) Active Problems:   HIV (human  immunodeficiency virus infection) (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 Extensive left upper lobe consolidation is noted as well as chronic lung changes bilaterally with moderate emphysema and extensive bronchiectasis with mucous plugging in the left lower lobe ID on board, appreciate recs Continue IV cefazolin, Bactrim Continue supplemental oxygen as needed  MSSA bacteremia BC growing MSSA Repeat BC X 2, NGTD TTE showed EF of 60-65%, no evidence of vegetation, cardiology consulted for TEE on 05/23/19 negative for any vegetations/thrombus MRI lumbar negative for any discitis/osteomylelitis as pt c/o back pain  ID on board, continue IV cefazolin  AKI Bump in creatinine S/p bolus IVF Daily BMP  HIV Noncompliant with antiretroviral meds Repeat CD4-->6/64, Viral load 840 ID on board, restart antiretrovirals  Anemia of chronic disease (HIV) Hemoglobin around baseline Anemia panel showed iron 20, sats 6, ferritin 47, folate 12.5, vitamin B12 607 Oral iron supplementation Daily CBC  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 Morton Parsons at [facility]  Type 2 diabetes mellitus A1c 5.4 SSI, Accu-Cheks, hypoglycemic protocol Patient not taking home Janumet and glipizide per report         Malnutrition Type:      Malnutrition Characteristics:      Nutrition Interventions:       Estimated body mass index is 21.76 kg/m as calculated  from the following:   Height as of this encounter: 6\' 1"  (1.854 m).   Weight as of this encounter: 74.8 kg.     Code Status: Full  Family Communication: Discussed with  patient extensively   Disposition Plan: Likely home once work up is complete, ID sign off   Consultants:  ID  Cardiology  Procedures:  TEE on 05/23/19  Antimicrobials:  Cefazolin  Bactrim  DVT prophylaxis: Lovenox   Objective: Vitals:   05/23/19 1314 05/23/19 1400 05/23/19 1417 05/23/19 1448  BP: (!) 85/60 (!) 81/46 (!) 80/43 (!) 86/56  Pulse: 90 94 99 98  Resp: 16 (!) 21 19 18   Temp: 97.9 F (36.6 C)   98.2 F (36.8 C)  TempSrc: Temporal   Oral  SpO2: 100% 97% 94% 91%  Weight:      Height:        Intake/Output Summary (Last 24 hours) at 05/23/2019 1637 Last data filed at 05/23/2019 1400 Gross per 24 hour  Intake 450 ml  Output 1095 ml  Net -645 ml   Filed Weights   05/20/19 2041 05/21/19 2109 05/22/19 2010  Weight: 77.4 kg 77.4 kg 74.8 kg    Exam:  General: NAD   Cardiovascular: S1, S2 present  Respiratory:  Diminished breath sounds bilaterally  Abdomen: Soft, nontender, nondistended, bowel sounds present, ostomy bag noted LLQ present  Musculoskeletal: No bilateral pedal edema noted  Skin: Normal  Psychiatry: Normal mood   Data Reviewed: CBC: Recent Labs  Lab 05/19/19 0552 05/20/19 0426 05/21/19 0547 05/22/19 0436 05/23/19 0513  WBC 11.7* 11.0* 10.6* 11.5* 7.4  NEUTROABS 8.9* 8.4* 6.5 6.7 3.5  HGB 8.0* 8.9* 9.5* 10.2* 10.0*  HCT 27.3* 30.4* 31.9* 33.3* 33.7*  MCV 83.5 83.5 83.1 81.8 84.3  PLT 242 304 365 406* 404*   Basic Metabolic Panel: Recent Labs  Lab 05/19/19 0552 05/20/19 0426 05/21/19 0547 05/22/19 0600 05/23/19 0513  NA 135 133* 134* 134* 134*  K 4.3 4.7 4.3 4.4 4.1  CL 103 102 101 102 100  CO2 22 22 24 23 24   GLUCOSE 136* 192* 207* 150* 133*  BUN 15 23* 24* 31* 34*  CREATININE 1.10 1.29* 1.23 1.36* 1.39*  CALCIUM 8.9 9.2 9.3 9.2 9.0   GFR: Estimated Creatinine Clearance: 68.8 mL/min (A) (by C-G formula based on SCr of 1.39 mg/dL (H)). Liver Function Tests: Recent Labs  Lab 05/17/19 1203  AST 24  ALT 13    ALKPHOS 61  BILITOT 0.8  PROT 9.1*  ALBUMIN 2.5*   No results for input(s): LIPASE, AMYLASE in the last 168 hours. No results for input(s): AMMONIA in the last 168 hours. Coagulation Profile: Recent Labs  Lab 05/17/19 1203  INR 1.1   Cardiac Enzymes: No results for input(s): CKTOTAL, CKMB, CKMBINDEX, TROPONINI in the last 168 hours. BNP (last 3 results) No results for input(s): PROBNP in the last 8760 hours. HbA1C: No results for input(s): HGBA1C in the last 72 hours. CBG: Recent Labs  Lab 05/22/19 1621 05/22/19 2009 05/23/19 0353 05/23/19 0727 05/23/19 1246  GLUCAP 209* 180* 116* 133* 123*   Lipid Profile: No results for input(s): CHOL, HDL, LDLCALC, TRIG, CHOLHDL, LDLDIRECT in the last 72 hours. Thyroid Function Tests: No results for input(s): TSH, T4TOTAL, FREET4, T3FREE, THYROIDAB in the last 72 hours. Anemia Panel: No results for input(s): VITAMINB12, FOLATE, FERRITIN, TIBC, IRON, RETICCTPCT in the last 72 hours. Urine analysis:    Component Value Date/Time   COLORURINE YELLOW 05/17/2019 1556   APPEARANCEUR  CLEAR 05/17/2019 1556   LABSPEC 1.020 05/17/2019 1556   PHURINE 6.0 05/17/2019 1556   GLUCOSEU NEGATIVE 05/17/2019 1556   HGBUR NEGATIVE 05/17/2019 1556   BILIRUBINUR NEGATIVE 05/17/2019 1556   KETONESUR NEGATIVE 05/17/2019 1556   PROTEINUR 30 (A) 05/17/2019 1556   UROBILINOGEN 1.0 03/23/2014 1400   NITRITE NEGATIVE 05/17/2019 1556   LEUKOCYTESUR NEGATIVE 05/17/2019 1556   Sepsis Labs: @LABRCNTIP (procalcitonin:4,lacticidven:4)  ) Recent Results (from the past 240 hour(s))  Culture, blood (Routine x 2)     Status: None   Collection Time: 05/17/19 12:23 PM   Specimen: BLOOD  Result Value Ref Range Status   Specimen Description BLOOD RIGHT ANTECUBITAL  Final   Special Requests   Final    BOTTLES DRAWN AEROBIC AND ANAEROBIC Blood Culture adequate volume   Culture   Final    NO GROWTH 5 DAYS Performed at [facility], [address]  [place], [place] [address]    Report Status 05/22/2019 FINAL  Final  Culture, blood (Routine x 2)     Status: Abnormal   Collection Time: 05/17/19  3:45 PM   Specimen: BLOOD RIGHT ARM  Result Value Ref Range Status   Specimen Description BLOOD RIGHT ARM  Final   Special Requests   Final    BOTTLES DRAWN AEROBIC AND ANAEROBIC Blood Culture results may not be optimal due to an inadequate volume of blood received in culture bottles   Culture  Setup Time   Final    GRAM POSITIVE COCCI IN CLUSTERS ANAEROBIC BOTTLE ONLY CRITICAL RESULT CALLED TO, READ BACK BY AND VERIFIED WITH: J. STEENWYK, PHARMD AT 0830 ON 05/18/19 BY C. JESSUP, MT. Performed at [facility], [address] [place], [place] [address]    Culture STAPHYLOCOCCUS AUREUS (A)  Final   Report Status 05/20/2019 FINAL  Final   Organism ID, Bacteria STAPHYLOCOCCUS AUREUS  Final      Susceptibility   Staphylococcus aureus - MIC*    CIPROFLOXACIN >=8 RESISTANT Resistant     ERYTHROMYCIN >=8 RESISTANT Resistant     GENTAMICIN <=0.5 SENSITIVE Sensitive     OXACILLIN <=0.25 SENSITIVE Sensitive     TETRACYCLINE <=1 SENSITIVE Sensitive     VANCOMYCIN <=0.5 SENSITIVE Sensitive     TRIMETH/SULFA >=320 RESISTANT Resistant     CLINDAMYCIN RESISTANT Resistant     RIFAMPIN <=0.5 SENSITIVE Sensitive     Inducible Clindamycin POSITIVE Resistant     * STAPHYLOCOCCUS AUREUS  SARS CORONAVIRUS 2 (TAT 6-24 HRS) Nasopharyngeal Nasopharyngeal Swab     Status: None   Collection Time: 05/17/19  7:48 PM   Specimen: Nasopharyngeal Swab  Result Value Ref Range Status   SARS Coronavirus 2 NEGATIVE NEGATIVE Final    Comment: (NOTE) SARS-CoV-2 target nucleic acids are NOT DETECTED. The SARS-CoV-2 RNA is generally detectable in upper and lower respiratory specimens during the acute phase of infection. Negative results do not preclude SARS-CoV-2 infection, do not rule out co-infections with other pathogens, and should not be used as the sole basis for  treatment or other patient management decisions. Negative results must be combined with clinical observations, patient history, and epidemiological information. The expected result is Negative. Fact Sheet for Patients: HairSlick.nohttps://www.fda.gov/media/138098/download Fact Sheet for Healthcare Providers: quierodirigir.comhttps://www.fda.gov/media/138095/download This test is not yet approved or cleared by the [place] FDA and  has been authorized for detection and/or diagnosis of SARS-CoV-2 by FDA under an Emergency Use Authorization (EUA). This EUA will remain  in effect (meaning this test can be  used) for the duration of the COVID-19 declaration under Section 56 4(b)(1) of the Act, 21 U.S.C. section 360bbb-3(b)(1), unless the authorization is terminated or revoked sooner. Performed at [facility], [address] [place], [place] [address]   Expectorated sputum assessment w rflx to resp cult     Status: None   Collection Time: 05/17/19  7:57 PM   Specimen: Expectorated Sputum  Result Value Ref Range Status   Specimen Description EXPECTORATED SPUTUM  Final   Special Requests NONE  Final   Sputum evaluation   Final    Sputum specimen not acceptable for testing.  Please recollect.   RESULT CALLED TO, READ BACK BY AND VERIFIED WITH: Jerelyn Scott RN 05/17/19 2112 JDW Performed at [facility], [address] [place], [place] [address]    Report Status 05/17/2019 FINAL  Final  Pneumocystis smear by DFA     Status: None   Collection Time: 05/17/19  7:57 PM   Specimen: Sputum; Respiratory  Result Value Ref Range Status   Specimen Source-PJSRC EXPECTORATED SPUTUM  Final   Pneumocystis jiroveci Ag NEGATIVE  Final    Comment: Performed at [facility] Performed at [facility], [address] [place], [place] [address]   Culture, blood (routine x 2)     Status: None (Preliminary result)   Collection Time: 05/19/19  4:39 PM   Specimen: BLOOD RIGHT HAND  Result Value Ref Range Status     Specimen Description BLOOD RIGHT HAND  Final   Special Requests   Final    BOTTLES DRAWN AEROBIC ONLY Blood Culture adequate volume   Culture   Final    NO GROWTH 4 DAYS Performed at [facility], [address], [place], [place] [address]    Report Status PENDING  Incomplete  Culture, blood (routine x 2)     Status: None (Preliminary result)   Collection Time: 05/19/19  4:43 PM   Specimen: BLOOD LEFT HAND  Result Value Ref Range Status   Specimen Description BLOOD LEFT HAND  Final   Special Requests   Final    BOTTLES DRAWN AEROBIC ONLY Blood Culture adequate volume   Culture   Final    NO GROWTH 4 DAYS Performed at [facility], [address] [place], [place] [address]    Report Status PENDING  Incomplete      Studies: MR Lumbar Spine W Wo Contrast  Result Date: 05/23/2019 CLINICAL DATA:  Fevers, chills. Evaluate for discitis. EXAM: MRI LUMBAR SPINE WITHOUT AND WITH CONTRAST TECHNIQUE: Multiplanar and multiecho pulse sequences of the lumbar spine were obtained without and with intravenous contrast. CONTRAST:  7.19mL GADAVIST GADOBUTROL 1 MMOL/ML IV SOLN COMPARISON:  None. FINDINGS: Segmentation:  Standard. Alignment:  Physiologic. Vertebrae:  No fracture, evidence of discitis, or bone lesion. Conus medullaris and cauda equina: Conus extends to the L1 level. Conus and cauda equina appear normal. Paraspinal and other soft tissues: Negative. Disc levels: L1-2: No spinal canal or neural foraminal stenosis. L2-3: No spinal canal or neural foraminal stenosis. L3-4: No spinal canal or neural foraminal stenosis. L4-5: No spinal canal or neural foraminal stenosis. L5-S1: Small central disc protrusion and mild facet degenerative changes. No spinal canal neural foraminal stenosis. IMPRESSION: 1. No evidence of discitis/osteomyelitis. 2. Mild degenerative changes at L5-S1. No spinal canal neural foraminal stenosis. Electronically Signed   By: Baldemar Lenis M.D.   On:  05/23/2019 12:27    Scheduled Meds: .  Darunavir-Cobicisctat-Emtricitabine-Tenofovir Alafenamide  1 tablet Oral Q breakfast  . dolutegravir  50 mg Oral Daily  . enoxaparin (LOVENOX) injection  40 mg Subcutaneous Q24H  . ferrous sulfate  325 mg Oral Q breakfast  . guaiFENesin  600 mg Oral BID  . insulin aspart  0-9 Units Subcutaneous TID WC  . sulfamethoxazole-trimethoprim  1 tablet Oral Daily    Continuous Infusions: .  ceFAZolin (ANCEF) IV 2 g (05/23/19 0505)     LOS: 6 days     Briant Cedar, MD [facility]  If 7PM-7AM, please contact night-coverage [email or web address] 05/23/2019, 4:37 PM

## 2019-05-23 NOTE — H&P (Signed)
[name] is a 49 y.o. male who has presented today for surgery, with the diagnosis of bacteremia.  The various methods of treatment have been discussed with the patient and family. After consideration of risks, benefits and other options for treatment, the patient has consented to  Procedure(s): TRANSESOPHAGEAL ECHOCARDIOGRAM (TEE) (N/A) as a surgical intervention .  The patient's history has been reviewed, patient examined, no change in status, stable for surgery.  I have reviewed the patient's chart and labs.  Questions were answered to the patient's satisfaction.    Jenice Leiner C. Duke Salvia, MD, [facility]  05/23/2019 1:30 PM

## 2019-05-23 NOTE — Transfer of Care (Signed)
Immediate Anesthesia Transfer of Care Note  Patient: [name]  Procedure(s) Performed: TRANSESOPHAGEAL ECHOCARDIOGRAM (TEE) (N/A )  Patient Location: Endoscopy Unit  Anesthesia Type:MAC  Level of Consciousness: drowsy and responds to stimulation  Airway & Oxygen Therapy: Patient Spontanous Breathing and Patient connected to nasal cannula oxygen  Post-op Assessment: Report given to RN and Post -op Vital signs reviewed and stable  Post vital signs: Reviewed and stable  Last Vitals:  Vitals Value Taken Time  BP 81/46 05/23/19 1358  Temp    Pulse 92 05/23/19 1359  Resp 20 05/23/19 1359  SpO2 96 % 05/23/19 1359  Vitals shown include unvalidated device data.  Last Pain:  Vitals:   05/23/19 1314  TempSrc: Temporal  PainSc: 0-No pain      Patients Stated Pain Goal: 0 (78/41/28 2081)  Complications: No apparent anesthesia complications

## 2019-05-23 NOTE — Plan of Care (Signed)
  Problem: Health Behavior/Discharge Planning: Goal: Ability to manage health-related needs will improve Outcome: Progressing   

## 2019-05-23 NOTE — Plan of Care (Signed)
  Problem: Clinical Measurements: Goal: Diagnostic test results will improve Outcome: Progressing   

## 2019-05-23 NOTE — Anesthesia Postprocedure Evaluation (Signed)
Anesthesia Post Note  Patient: [name] P Collard  Procedure(s) Performed: TRANSESOPHAGEAL ECHOCARDIOGRAM (TEE) (N/A )     Patient location during evaluation: PACU Anesthesia Type: MAC Level of consciousness: awake and alert Pain management: pain level controlled Vital Signs Assessment: post-procedure vital signs reviewed and stable Respiratory status: spontaneous breathing, nonlabored ventilation, respiratory function stable and patient connected to nasal cannula oxygen Cardiovascular status: stable and blood pressure returned to baseline Postop Assessment: no apparent nausea or vomiting Anesthetic complications: no    Last Vitals:  Vitals:   05/23/19 1448 05/23/19 1640  BP: (!) 86/56 (!) 81/54  Pulse: 98 93  Resp: 18 18  Temp: 36.8 C 36.4 C  SpO2: 91% 96%    Last Pain:  Vitals:   05/23/19 1640  TempSrc: Oral  PainSc:                  Yanet Balliet DAVID

## 2019-05-24 ENCOUNTER — Encounter: Payer: Self-pay | Admitting: *Deleted

## 2019-05-24 DIAGNOSIS — J9601 Acute respiratory failure with hypoxia: Secondary | ICD-10-CM

## 2019-05-24 LAB — CULTURE, BLOOD (ROUTINE X 2)
Culture: NO GROWTH
Culture: NO GROWTH
Special Requests: ADEQUATE
Special Requests: ADEQUATE

## 2019-05-24 LAB — BASIC METABOLIC PANEL
Anion gap: 7 (ref 5–15)
BUN: 28 mg/dL — ABNORMAL HIGH (ref 6–20)
CO2: 26 mmol/L (ref 22–32)
Calcium: 8.8 mg/dL — ABNORMAL LOW (ref 8.9–10.3)
Chloride: 101 mmol/L (ref 98–111)
Creatinine, Ser: 1.22 mg/dL (ref 0.61–1.24)
GFR calc Af Amer: >60 mL/min (ref >60)
GFR calc non Af Amer: >60 mL/min (ref >60)
Glucose, Bld: 150 mg/dL — ABNORMAL HIGH (ref 70–99)
Potassium: 3.8 mmol/L (ref 3.5–5.1)
Sodium: 134 mmol/L — ABNORMAL LOW (ref 135–145)

## 2019-05-24 LAB — GLUCOSE, CAPILLARY
Glucose-Capillary: 147 mg/dL — ABNORMAL HIGH (ref 70–99)
Glucose-Capillary: 145 mg/dL — ABNORMAL HIGH (ref 70–99)
Glucose-Capillary: 170 mg/dL — ABNORMAL HIGH (ref 70–99)
Glucose-Capillary: 172 mg/dL — ABNORMAL HIGH (ref 70–99)

## 2019-05-24 LAB — CBC WITH DIFFERENTIAL/PLATELET
Abs Immature Granulocytes: 0.22 10*3/uL — ABNORMAL HIGH (ref 0.00–0.07)
Basophils Absolute: 0 10*3/uL (ref 0.0–0.1)
Basophils Relative: 0 %
Eosinophils Absolute: 0.3 10*3/uL (ref 0.0–0.5)
Eosinophils Relative: 4 %
HCT: 33.4 % — ABNORMAL LOW (ref 39.0–52.0)
Hemoglobin: 9.8 g/dL — ABNORMAL LOW (ref 13.0–17.0)
Immature Granulocytes: 3 %
Lymphocytes Relative: 32 %
Lymphs Abs: 2.3 10*3/uL (ref 0.7–4.0)
MCH: 24.6 pg — ABNORMAL LOW (ref 26.0–34.0)
MCHC: 29.3 g/dL — ABNORMAL LOW (ref 30.0–36.0)
MCV: 83.7 fL (ref 80.0–100.0)
Monocytes Absolute: 0.8 10*3/uL (ref 0.1–1.0)
Monocytes Relative: 11 %
Neutro Abs: 3.7 10*3/uL (ref 1.7–7.7)
Neutrophils Relative %: 50 %
Platelets: 349 10*3/uL (ref 150–400)
RBC: 3.99 MIL/uL — ABNORMAL LOW (ref 4.22–5.81)
RDW: 25.2 % — ABNORMAL HIGH (ref 11.5–15.5)
WBC: 7.4 10*3/uL (ref 4.0–10.5)
nRBC: 0 % (ref 0.0–0.2)

## 2019-05-24 MED ORDER — SODIUM CHLORIDE 0.9 % IV SOLN: INTRAVENOUS | Status: DC

## 2019-05-24 NOTE — Progress Notes (Signed)
Patient ID: [name], male   DOB: 1970/06/03, 49 y.o.   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  CTA chest PE was negative for acute PE but showed extensive left upper lobe consolidation along with chronic lung changes bilaterally with moderate emphysema and extensive bronchiectasis with mucous plugging in the left lower lobe.  ID was consulted.  Patient was started on broad-spectrum antibiotics as well as IV Bactrim as per ID recommendations for suspected pneumocystis pneumonia.  During the hospitalization, he was also found to have MSSA bacteremia.  Antibiotics were switched to IV Ancef.  He underwent TEE on 05/23/2019 which was negative for vegetations.  Assessment & Plan:   Acute hypoxic respiratory failure MSSA pneumonia -CTA chest PE study is negative for acute PE. Extensive left upper lobe consolidation is noted as well as chronic lung changes bilaterally with moderate emphysema and extensive bronchiectasis with mucous plugging in the left lower lobe -Patient was initially started on broad-spectrum antibiotics as well as IV Bactrim for suspected pneumocystis pneumonia.  PJP sputum negative -Subsequently he was found to have MSSA bacteremia.  He is currently on IV Ancef.  ID following. -Currently on room air  MSSA bacteremia -Repeat blood cultures negative so far. -TTE showed EF of 60 to 65% with no evidence of vegetation -TEE on 05/23/2019 was negative for any vegetation/thrombus -MRI lumbar spine negative for any discitis/osteomyelitis -ID will determine the length of  antibiotic treatment.  Currently on IV Ancef  Acute kidney injury -Status post IV fluid treatment.  Creatinine stable.  HIV/AIDS Noncompliance with antiretroviral meds -CD4 of 64 and HIV viral load of 840 -Patient has been started on antiretrovirals by ID.  Patient needs to be compliant with medications and follow-up  Anemia of chronic disease -Hemoglobin stable.  Continue oral iron supplementation.  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 Byrd Hesselbach at [facility] -Continue ostomy care  Type 2 diabetes mellitus -A1c 5.4 -SSI, Accu-Cheks, hypoglycemic protocol -Patient not taking home Janumet and glipizide per report    DVT prophylaxis: Lovenox Code Status: Full Family Communication: Spoke to patient at bedside Disposition Plan: Home in 1 to 2 days once cleared by ID.  Still on intravenous antibiotics currently.  Consultants: ID/cardiology  Procedures:  2D echo TEE on 05/23/2019 Antimicrobials:  Anti-infectives (From admission, onward)   Start     Dose/Rate Route Frequency Ordered Stop   05/20/19 1000  sulfamethoxazole-trimethoprim (BACTRIM DS) 800-160 MG per tablet 1 tablet     1 tablet Oral Daily 05/19/19 1023     05/20/19 0800  Darunavir-Cobicisctat-Emtricitabine-Tenofovir Alafenamide (SYMTUZA) 800-150-200-10 MG TABS 1 tablet     1 tablet Oral Daily with breakfast 05/19/19 1826     05/19/19 2300  vancomycin (VANCOREADY) IVPB 1250 mg/250 mL  Status:  Discontinued     1,250 mg 166.7 mL/hr over 90 Minutes Intravenous Every 12 hours 05/19/19 1023 05/20/19 1250   05/19/19 1830  dolutegravir (TIVICAY) tablet 50 mg     50 mg Oral Daily 05/19/19 1826     05/19/19 1400  ceFAZolin (  ANCEF) IVPB 2g/100 mL premix     2 g 200 mL/hr over 30 Minutes Intravenous Every 8 hours 05/19/19 1023     05/19/19 1100  vancomycin (VANCOREADY) IVPB 1500 mg/300 mL     1,500 mg 150 mL/hr over 120 Minutes  Intravenous  Once 05/19/19 1023 05/19/19 1311   05/19/19 1015  sulfamethoxazole-trimethoprim (BACTRIM DS) 800-160 MG per tablet 1 tablet  Status:  Discontinued     1 tablet Oral Daily 05/19/19 1013 05/19/19 1023   05/18/19 1800  azithromycin (ZITHROMAX) 500 mg in sodium chloride 0.9 % 250 mL IVPB  Status:  Discontinued     500 mg 250 mL/hr over 60 Minutes Intravenous Every 24 hours 05/17/19 2044 05/19/19 1013   05/18/19 1800  cefTRIAXone (ROCEPHIN) 1 g in sodium chloride 0.9 % 100 mL IVPB  Status:  Discontinued     1 g 200 mL/hr over 30 Minutes Intravenous Every 24 hours 05/17/19 2044 05/19/19 1013   05/17/19 1715  sulfamethoxazole-trimethoprim (BACTRIM) 375 mg in dextrose 5 % 500 mL IVPB  Status:  Discontinued     375 mg 349 mL/hr over 90 Minutes Intravenous Every 8 hours 05/17/19 1700 05/19/19 1013   05/17/19 1630  cefTRIAXone (ROCEPHIN) 1 g in sodium chloride 0.9 % 100 mL IVPB     1 g 200 mL/hr over 30 Minutes Intravenous  Once 05/17/19 1617 05/17/19 1940   05/17/19 1630  azithromycin (ZITHROMAX) 500 mg in sodium chloride 0.9 % 250 mL IVPB     500 mg 250 mL/hr over 60 Minutes Intravenous  Once 05/17/19 1617 05/17/19 1940       Subjective: Patient seen and examined at bedside.  Denies any overnight fever.  Still complains of intermittent cough.  No worsening shortness of breath, nausea or vomiting.  Objective: Vitals:   05/23/19 1746 05/23/19 2036 05/24/19 0519 05/24/19 0937  BP: 96/62 95/61 95/70  102/67  Pulse: 89 91 97 95  Resp:  16 18 18   Temp:  98.7 F (37.1 C) 98.7 F (37.1 C) 98.2 F (36.8 C)  TempSrc:  Oral Oral Oral  SpO2:  92% 94% 93%  Weight:      Height:        Intake/Output Summary (Last 24 hours) at 05/24/2019 1413 Last data filed at 05/24/2019 1307 Gross per 24 hour  Intake 560.07 ml  Output 1280 ml  Net -719.93 ml   Filed Weights   05/20/19 2041 05/21/19 2109 05/22/19 2010  Weight: 77.4 kg 77.4 kg 74.8 kg    Examination:  General exam: Appears  calm and comfortable.  Chronically ill.  Very thinly built male lying in bed. Respiratory system: Bilateral decreased breath sounds at bases with some scattered crackles Cardiovascular system: S1 & S2 heard, Rate controlled Gastrointestinal system: Abdomen is nondistended, soft and nontender. Normal bowel sounds heard. Extremities: No cyanosis, clubbing, edema      Data Reviewed: I have personally reviewed following labs and imaging studies  CBC: Recent Labs  Lab 05/20/19 0426 05/21/19 0547 05/22/19 0436 05/23/19 0513 05/24/19 0443  WBC 11.0* 10.6* 11.5* 7.4 7.4  NEUTROABS 8.4* 6.5 6.7 3.5 3.7  HGB 8.9* 9.5* 10.2* 10.0* 9.8*  HCT 30.4* 31.9* 33.3* 33.7* 33.4*  MCV 83.5 83.1 81.8 84.3 83.7  PLT 304 365 406* 404* 349   Basic Metabolic Panel: Recent Labs  Lab 05/20/19 0426 05/21/19 0547 05/22/19 0600 05/23/19 0513 05/24/19 0443  NA 133* 134* 134* 134* 134*  K 4.7 4.3 4.4 4.1 3.8  CL 102 101  102 100 101  CO2 22 24 23 24 26   GLUCOSE 192* 207* 150* 133* 150*  BUN 23* 24* 31* 34* 28*  CREATININE 1.29* 1.23 1.36* 1.39* 1.22  CALCIUM 9.2 9.3 9.2 9.0 8.8*   GFR: Estimated Creatinine Clearance: 78.3 mL/min (by C-G formula based on SCr of 1.22 mg/dL). Liver Function Tests: No results for input(s): AST, ALT, ALKPHOS, BILITOT, PROT, ALBUMIN in the last 168 hours. No results for input(s): LIPASE, AMYLASE in the last 168 hours. No results for input(s): AMMONIA in the last 168 hours. Coagulation Profile: No results for input(s): INR, PROTIME in the last 168 hours. Cardiac Enzymes: No results for input(s): CKTOTAL, CKMB, CKMBINDEX, TROPONINI in the last 168 hours. BNP (last 3 results) No results for input(s): PROBNP in the last 8760 hours. HbA1C: No results for input(s): HGBA1C in the last 72 hours. CBG: Recent Labs  Lab 05/23/19 1246 05/23/19 1641 05/23/19 2036 05/24/19 0730 05/24/19 1118  GLUCAP 123* 204* 152* 147* 145*   Lipid Profile: No results for input(s):  CHOL, HDL, LDLCALC, TRIG, CHOLHDL, LDLDIRECT in the last 72 hours. Thyroid Function Tests: No results for input(s): TSH, T4TOTAL, FREET4, T3FREE, THYROIDAB in the last 72 hours. Anemia Panel: No results for input(s): VITAMINB12, FOLATE, FERRITIN, TIBC, IRON, RETICCTPCT in the last 72 hours. Sepsis Labs: Recent Labs  Lab 05/17/19 1545  LATICACIDVEN 1.3    Recent Results (from the past 240 hour(s))  Culture, blood (Routine x 2)     Status: None   Collection Time: 05/17/19 12:23 PM   Specimen: BLOOD  Result Value Ref Range Status   Specimen Description BLOOD RIGHT ANTECUBITAL  Final   Special Requests   Final    BOTTLES DRAWN AEROBIC AND ANAEROBIC Blood Culture adequate volume   Culture   Final    NO GROWTH 5 DAYS Performed at [facility], [address], [place], [place]    Report Status 05/22/2019 FINAL  Final  Culture, blood (Routine x 2)     Status: Abnormal   Collection Time: 05/17/19  3:45 PM   Specimen: BLOOD RIGHT ARM  Result Value Ref Range Status   Specimen Description BLOOD RIGHT ARM  Final   Special Requests   Final    BOTTLES DRAWN AEROBIC AND ANAEROBIC Blood Culture results may not be optimal due to an inadequate volume of blood received in culture bottles   Culture  Setup Time   Final    GRAM POSITIVE COCCI IN CLUSTERS ANAEROBIC BOTTLE ONLY CRITICAL RESULT CALLED TO, READ BACK BY AND VERIFIED WITH: J. STEENWYK, PHARMD AT 0830 ON 05/18/19 BY C. JESSUP, MT. Performed at [facility], [address], [place], [place]    Culture STAPHYLOCOCCUS AUREUS (A)  Final   Report Status 05/20/2019 FINAL  Final   Organism ID, Bacteria STAPHYLOCOCCUS AUREUS  Final      Susceptibility   Staphylococcus aureus - MIC*    CIPROFLOXACIN >=8 RESISTANT Resistant     ERYTHROMYCIN >=8 RESISTANT Resistant     GENTAMICIN <=0.5 SENSITIVE Sensitive     OXACILLIN <=0.25 SENSITIVE Sensitive     TETRACYCLINE <=1 SENSITIVE Sensitive     VANCOMYCIN <=0.5  SENSITIVE Sensitive     TRIMETH/SULFA >=320 RESISTANT Resistant     CLINDAMYCIN RESISTANT Resistant     RIFAMPIN <=0.5 SENSITIVE Sensitive     Inducible Clindamycin POSITIVE Resistant     * STAPHYLOCOCCUS AUREUS  SARS CORONAVIRUS 2 (TAT 6-24 HRS) Nasopharyngeal Nasopharyngeal Swab  Status: None   Collection Time: 05/17/19  7:48 PM   Specimen: Nasopharyngeal Swab  Result Value Ref Range Status   SARS Coronavirus 2 NEGATIVE NEGATIVE Final    Comment: (NOTE) SARS-CoV-2 target nucleic acids are NOT DETECTED. The SARS-CoV-2 RNA is generally detectable in upper and lower respiratory specimens during the acute phase of infection. Negative results do not preclude SARS-CoV-2 infection, do not rule out co-infections with other pathogens, and should not be used as the sole basis for treatment or other patient management decisions. Negative results must be combined with clinical observations, patient history, and epidemiological information. The expected result is Negative. Fact Sheet for Patients: HairSlick.nohttps://www.fda.gov/media/138098/download Fact Sheet for Healthcare Providers: quierodirigir.comhttps://www.fda.gov/media/138095/download This test is not yet approved or cleared by the [place] FDA and  has been authorized for detection and/or diagnosis of SARS-CoV-2 by FDA under an Emergency Use Authorization (EUA). This EUA will remain  in effect (meaning this test can be used) for the duration of the COVID-19 declaration under Section 56 4(b)(1) of the Act, 21 U.S.C. section 360bbb-3(b)(1), unless the authorization is terminated or revoked sooner. Performed at [facility], [address] [place], [place] [address]   Expectorated sputum assessment w rflx to resp cult     Status: None   Collection Time: 05/17/19  7:57 PM   Specimen: Expectorated Sputum  Result Value Ref Range Status   Specimen Description EXPECTORATED SPUTUM  Final   Special Requests NONE  Final   Sputum evaluation   Final     Sputum specimen not acceptable for testing.  Please recollect.   RESULT CALLED TO, READ BACK BY AND VERIFIED WITH: Jerelyn ScottBARWICK RN 05/17/19 2112 JDW Performed at [facility], [address] [place], [place] [address]    Report Status 05/17/2019 FINAL  Final  Pneumocystis smear by DFA     Status: None   Collection Time: 05/17/19  7:57 PM   Specimen: Sputum; Respiratory  Result Value Ref Range Status   Specimen Source-PJSRC EXPECTORATED SPUTUM  Final   Pneumocystis jiroveci Ag NEGATIVE  Final    Comment: Performed at [facility] Performed at [facility], [address] [place], [place] [address]   Culture, blood (routine x 2)     Status: None   Collection Time: 05/19/19  4:39 PM   Specimen: BLOOD RIGHT HAND  Result Value Ref Range Status   Specimen Description BLOOD RIGHT HAND  Final   Special Requests   Final    BOTTLES DRAWN AEROBIC ONLY Blood Culture adequate volume   Culture   Final    NO GROWTH 5 DAYS Performed at [facility], [address] [place], [place] [address]    Report Status 05/24/2019 FINAL  Final  Culture, blood (routine x 2)     Status: None   Collection Time: 05/19/19  4:43 PM   Specimen: BLOOD LEFT HAND  Result Value Ref Range Status   Specimen Description BLOOD LEFT HAND  Final   Special Requests   Final    BOTTLES DRAWN AEROBIC ONLY Blood Culture adequate volume   Culture   Final    NO GROWTH 5 DAYS Performed at [facility], [address] [place], [place] [address]    Report Status 05/24/2019 FINAL  Final         Radiology Studies: MR Lumbar Spine W Wo Contrast  Result Date: 05/23/2019 CLINICAL DATA:  Fevers, chills. Evaluate for discitis. EXAM: MRI LUMBAR SPINE  WITHOUT AND WITH CONTRAST TECHNIQUE: Multiplanar and multiecho pulse sequences of the lumbar spine were obtained without and with intravenous contrast. CONTRAST:  7.66mL GADAVIST GADOBUTROL 1 MMOL/ML IV SOLN COMPARISON:  None. FINDINGS: Segmentation:   Standard. Alignment:  Physiologic. Vertebrae:  No fracture, evidence of discitis, or bone lesion. Conus medullaris and cauda equina: Conus extends to the L1 level. Conus and cauda equina appear normal. Paraspinal and other soft tissues: Negative. Disc levels: L1-2: No spinal canal or neural foraminal stenosis. L2-3: No spinal canal or neural foraminal stenosis. L3-4: No spinal canal or neural foraminal stenosis. L4-5: No spinal canal or neural foraminal stenosis. L5-S1: Small central disc protrusion and mild facet degenerative changes. No spinal canal neural foraminal stenosis. IMPRESSION: 1. No evidence of discitis/osteomyelitis. 2. Mild degenerative changes at L5-S1. No spinal canal neural foraminal stenosis. Electronically Signed   By: Pedro Earls M.D.   On: 05/23/2019 12:27   ECHO TEE  Result Date: 05/24/2019    TRANSESOPHOGEAL ECHO REPORT   Patient Name:   OWEN PRATTE Date of Exam: 05/23/2019 Medical Rec #:  [id number]          Height:       73.0 in Accession #:    [id number]         Weight:       164.9 lb Date of Birth:  Jul 09, 1970           BSA:          1.982 m Patient Age:    51 years           BP:           90/78 mmHg Patient Gender: M                  HR:           101 bpm. Exam Location:  Inpatient Procedure: Transesophageal Echo, Cardiac Doppler and Color Doppler Indications:     Bacteremia  History:         Patient has prior history of Echocardiogram examinations, most                  recent 05/21/2019. CHF, COPD, Signs/Symptoms:Dyspnea; Risk                  Factors:Diabetes. HIV. Pneumonia. Respiratory failure.                  Tuberculosis.  Sonographer:     Roseanna Rainbow RDCS Referring Phys:  [id number] Darreld Mclean Diagnosing Phys: Skeet Latch MD PROCEDURE: The transesophogeal probe was passed without difficulty through the esophogus of the patient. Imaged were obtained with the patient in a left lateral decubitus position. Sedation performed by different physician. The  patient was monitored while under deep sedation. Anesthestetic sedation was provided intravenously by Anesthesiology: 655mg  of Propofol. The patient's vital signs; including heart rate, blood pressure, and oxygen saturation; remained stable throughout the procedure. The patient developed no complications during the procedure. IMPRESSIONS  1. Left ventricular ejection fraction, by estimation, is 55 to 60%. The left ventricle has normal function. The left ventricle has no regional wall motion abnormalities.  2. Right ventricular systolic function is normal. The right ventricular size is normal.  3. No left atrial/left atrial appendage thrombus was detected. The LAA emptying velocity was 84 cm/s.  4. The mitral valve is normal in structure. Mild mitral valve regurgitation. No evidence of mitral stenosis.  5. The aortic valve is tricuspid.  Aortic valve regurgitation is not visualized. No aortic stenosis is present.  6. The inferior vena cava is normal in size with greater than 50% respiratory variability, suggesting right atrial pressure of 3 mmHg. Conclusion(s)/Recommendation(s): Normal biventricular function without evidence of hemodynamically significant valvular heart disease. No evidence of vegetation/infective endocarditis on this transesophageal echocardiogram. FINDINGS  Left Ventricle: Left ventricular ejection fraction, by estimation, is 55 to 60%. The left ventricle has normal function. The left ventricle has no regional wall motion abnormalities. The left ventricular internal cavity size was normal in size. There is  no left ventricular hypertrophy. Right Ventricle: The right ventricular size is normal. No increase in right ventricular wall thickness. Right ventricular systolic function is normal. Left Atrium: Left atrial size was normal in size. No left atrial/left atrial appendage thrombus was detected. The LAA emptying velocity was 84 cm/s. Right Atrium: Right atrial size was normal in size. Pericardium:  There is no evidence of pericardial effusion. Mitral Valve: Multiple mitral regurgitation jets. The mitral valve is normal in structure. Normal mobility of the mitral valve leaflets. Mild mitral valve regurgitation. No evidence of mitral valve stenosis. Tricuspid Valve: The tricuspid valve is normal in structure. Tricuspid valve regurgitation is trivial. No evidence of tricuspid stenosis. Aortic Valve: The aortic valve is tricuspid. Aortic valve regurgitation is not visualized. No aortic stenosis is present. Pulmonic Valve: The pulmonic valve was normal in structure. Pulmonic valve regurgitation is not visualized. No evidence of pulmonic stenosis. Aorta: The aortic root is normal in size and structure. Venous: The inferior vena cava is normal in size with greater than 50% respiratory variability, suggesting right atrial pressure of 3 mmHg. IAS/Shunts: No atrial level shunt detected by color flow Doppler. Chilton Si MD Electronically signed by Chilton Si MD Signature Date/Time: 05/24/2019/10:56:07 AM    Final         Scheduled Meds:  Darunavir-Cobicisctat-Emtricitabine-Tenofovir Alafenamide  1 tablet Oral Q breakfast   dolutegravir  50 mg Oral Daily   enoxaparin (LOVENOX) injection  40 mg Subcutaneous Q24H   ferrous sulfate  325 mg Oral Q breakfast   guaiFENesin  600 mg Oral BID   insulin aspart  0-9 Units Subcutaneous TID WC   sulfamethoxazole-trimethoprim  1 tablet Oral Daily   Continuous Infusions:  sodium chloride 20 mL/hr at 05/24/19 0836    ceFAZolin (ANCEF) IV 2 g (05/24/19 1307)          Glade Lloyd, MD [facility] 05/24/2019, 2:13 PM

## 2019-05-24 NOTE — Plan of Care (Signed)
  Problem: Health Behavior/Discharge Planning: Goal: Ability to manage health-related needs will improve Outcome: Progressing   

## 2019-05-24 NOTE — Plan of Care (Signed)
  Problem: Health Behavior/Discharge Planning: Goal: Ability to manage health-related needs will improve Outcome: Progressing   Problem: Clinical Measurements: Goal: Will remain free from infection Outcome: Progressing   

## 2019-05-24 NOTE — Progress Notes (Addendum)
[facility]    Date of Admission:  05/17/2019   Total days of antibiotics 8           ID: [name] is a 49 y.o. 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 Afebrile. Denies cough  Medications:  . Darunavir-Cobicisctat-Emtricitabine-Tenofovir Alafenamide  1 tablet Oral Q breakfast  . dolutegravir  50 mg Oral Daily  . enoxaparin (LOVENOX) injection  40 mg Subcutaneous Q24H  . ferrous sulfate  325 mg Oral Q breakfast  . guaiFENesin  600 mg Oral BID  . insulin aspart  0-9 Units Subcutaneous TID WC  . sulfamethoxazole-trimethoprim  1 tablet Oral Daily    Objective: Vital signs in last 24 hours: Temp:  [97.6 F (36.4 C)-98.7 F (37.1 C)] 98.2 F (36.8 C) (03/10 0937) Pulse Rate:  [89-97] 95 (03/10 0937) Resp:  [16-18] 18 (03/10 0937) BP: (81-102)/(54-70) 102/67 (03/10 0937) SpO2:  [92 %-96 %] 93 % (03/10 3976)  Physical Exam  Constitutional: He is oriented to person, place, and time. He appears well-developed and well-nourished. No distress.  HENT:  Mouth/Throat: Oropharynx is clear and moist. No oropharyngeal exudate.  Cardiovascular: Normal rate, regular rhythm and normal heart sounds. Exam reveals no gallop and no friction rub.  No murmur heard.  Pulmonary/Chest: Effort normal and breath sounds normal. No respiratory distress. He has no wheezes.  Abdominal: Soft. Bowel sounds are normal. He exhibits no distension. There is no tenderness. Ostomy in place Lymphadenopathy:  He has no cervical adenopathy.  Neurological: He is alert and oriented to person, place, and time.  Skin: Skin is warm and dry. No rash noted. No erythema.  Psychiatric: He has a normal mood and affect. His behavior is normal.       Lab Results Recent Labs    05/23/19 0513 05/24/19 0443  WBC 7.4 7.4  HGB 10.0* 9.8*  HCT 33.7* 33.4*  NA 134* 134*  K 4.1 3.8  CL 100 101  CO2 24 26  BUN 34* 28*  CREATININE 1.39* 1.22    Microbiology: reviewed Studies/Results: MR Lumbar Spine W Wo Contrast  Result Date: 05/23/2019 CLINICAL DATA:  Fevers, chills. Evaluate for discitis. EXAM: MRI LUMBAR SPINE WITHOUT AND WITH CONTRAST TECHNIQUE: Multiplanar and multiecho pulse sequences of the lumbar spine were obtained without and with intravenous contrast. CONTRAST:  7.48mL GADAVIST GADOBUTROL 1 MMOL/ML IV SOLN COMPARISON:  None. FINDINGS: Segmentation:  Standard. Alignment:  Physiologic. Vertebrae:  No fracture, evidence of discitis, or bone lesion. Conus medullaris and cauda equina: Conus extends to the L1 level. Conus and cauda equina appear normal. Paraspinal and other soft tissues: Negative. Disc levels: L1-2: No spinal canal or neural foraminal stenosis. L2-3: No spinal canal or neural foraminal stenosis. L3-4: No spinal canal or neural foraminal stenosis. L4-5: No spinal canal or neural foraminal stenosis. L5-S1: Small central disc protrusion and mild facet degenerative changes. No spinal canal neural foraminal stenosis. IMPRESSION: 1. No evidence of discitis/osteomyelitis. 2. Mild degenerative changes at L5-S1. No spinal canal neural foraminal stenosis. Electronically Signed   By: Baldemar Lenis M.D.   On: 05/23/2019 12:27   ECHO TEE  Result Date: 05/24/2019    TRANSESOPHOGEAL ECHO REPORT   Patient Name:  [name] Date of Exam: 05/23/2019 Medical Rec #:  [id number]          Height:       73.0 in Accession #:    [id number]         Weight:       164.9 lb Date of Birth:  1971-02-21           BSA:          1.982 m Patient Age:    28 years           BP:           90/78 mmHg Patient Gender: M                  HR:           101 bpm. Exam Location:  Inpatient Procedure: Transesophageal Echo, Cardiac Doppler and  Color Doppler Indications:     Bacteremia  History:         Patient has prior history of Echocardiogram examinations, most                  recent 05/21/2019. CHF, COPD, Signs/Symptoms:Dyspnea; Risk                  Factors:Diabetes. HIV. Pneumonia. Respiratory failure.                  Tuberculosis.  Sonographer:     Roseanna Rainbow RDCS Referring Phys:  [id number] Darreld Mclean Diagnosing Phys: Skeet Latch MD PROCEDURE: The transesophogeal probe was passed without difficulty through the esophogus of the patient. Imaged were obtained with the patient in a left lateral decubitus position. Sedation performed by different physician. The patient was monitored while under deep sedation. Anesthestetic sedation was provided intravenously by Anesthesiology: 655mg  of Propofol. The patient's vital signs; including heart rate, blood pressure, and oxygen saturation; remained stable throughout the procedure. The patient developed no complications during the procedure. IMPRESSIONS  1. Left ventricular ejection fraction, by estimation, is 55 to 60%. The left ventricle has normal function. The left ventricle has no regional wall motion abnormalities.  2. Right ventricular systolic function is normal. The right ventricular size is normal.  3. No left atrial/left atrial appendage thrombus was detected. The LAA emptying velocity was 84 cm/s.  4. The mitral valve is normal in structure. Mild mitral valve regurgitation. No evidence of mitral stenosis.  5. The aortic valve is tricuspid. Aortic valve regurgitation is not visualized. No aortic stenosis is present.  6. The inferior vena cava is normal in size with greater than 50% respiratory variability, suggesting right atrial pressure of 3 mmHg. Conclusion(s)/Recommendation(s): Normal biventricular function without evidence of hemodynamically significant valvular heart disease. No evidence of vegetation/infective endocarditis on this transesophageal echocardiogram. FINDINGS  Left  Ventricle: Left ventricular ejection fraction, by estimation, is 55 to 60%. The left ventricle has normal function. The left ventricle has no regional wall motion abnormalities. The left ventricular internal cavity size was normal in size. There is  no left ventricular hypertrophy. Right Ventricle: The right ventricular size is normal. No increase in right ventricular wall thickness. Right ventricular systolic function is normal. Left Atrium: Left atrial size was normal in size. No left atrial/left atrial appendage thrombus was detected. The LAA emptying velocity was 84 cm/s. Right Atrium: Right atrial size was normal in size. Pericardium: There is no evidence of pericardial effusion. Mitral Valve: Multiple mitral regurgitation jets. The mitral valve is normal in structure. Normal  mobility of the mitral valve leaflets. Mild mitral valve regurgitation. No evidence of mitral valve stenosis. Tricuspid Valve: The tricuspid valve is normal in structure. Tricuspid valve regurgitation is trivial. No evidence of tricuspid stenosis. Aortic Valve: The aortic valve is tricuspid. Aortic valve regurgitation is not visualized. No aortic stenosis is present. Pulmonic Valve: The pulmonic valve was normal in structure. Pulmonic valve regurgitation is not visualized. No evidence of pulmonic stenosis. Aorta: The aortic root is normal in size and structure. Venous: The inferior vena cava is normal in size with greater than 50% respiratory variability, suggesting right atrial pressure of 3 mmHg. IAS/Shunts: No atrial level shunt detected by color flow Doppler. Chilton Si MD Electronically signed by Chilton Si MD Signature Date/Time: 05/24/2019/10:56:07 AM    Final      Assessment/Plan: mssa pneumonia with secondary bacteremia = will need total of 14 days of treatment. His TEE is negative rulling out endocarditis. Continue on iv cefazolin. We will switch to oral linezolid on Friday to finish out course of therapy  hiv  disease= continue on tivicay and symtuza.will send in rx  oi proph = bactrim ds daily  Hx of colostomy = please have wound vac rn provide some supplies prior to discharge [facility] Cell: [phone] Pager: [phone]  05/24/2019, 2:58 PM

## 2019-05-25 LAB — BASIC METABOLIC PANEL
Anion gap: 9 (ref 5–15)
BUN: 19 mg/dL (ref 6–20)
CO2: 25 mmol/L (ref 22–32)
Calcium: 8.8 mg/dL — ABNORMAL LOW (ref 8.9–10.3)
Chloride: 101 mmol/L (ref 98–111)
Creatinine, Ser: 1.47 mg/dL — ABNORMAL HIGH (ref 0.61–1.24)
GFR calc Af Amer: >60 mL/min (ref >60)
GFR calc non Af Amer: 56 mL/min — ABNORMAL LOW (ref >60)
Glucose, Bld: 118 mg/dL — ABNORMAL HIGH (ref 70–99)
Potassium: 4.4 mmol/L (ref 3.5–5.1)
Sodium: 135 mmol/L (ref 135–145)

## 2019-05-25 LAB — CBC WITH DIFFERENTIAL/PLATELET
Abs Immature Granulocytes: 0 10*3/uL (ref 0.00–0.07)
Basophils Absolute: 0.1 10*3/uL (ref 0.0–0.1)
Basophils Relative: 1 %
Eosinophils Absolute: 0.3 10*3/uL (ref 0.0–0.5)
Eosinophils Relative: 4 %
HCT: 33.7 % — ABNORMAL LOW (ref 39.0–52.0)
Hemoglobin: 10 g/dL — ABNORMAL LOW (ref 13.0–17.0)
Lymphocytes Relative: 30 %
Lymphs Abs: 2.2 10*3/uL (ref 0.7–4.0)
MCH: 25 pg — ABNORMAL LOW (ref 26.0–34.0)
MCHC: 29.7 g/dL — ABNORMAL LOW (ref 30.0–36.0)
MCV: 84.3 fL (ref 80.0–100.0)
Monocytes Absolute: 1 10*3/uL (ref 0.1–1.0)
Monocytes Relative: 14 %
Neutro Abs: 3.7 10*3/uL (ref 1.7–7.7)
Neutrophils Relative %: 51 %
Platelets: 321 10*3/uL (ref 150–400)
RBC: 4 MIL/uL — ABNORMAL LOW (ref 4.22–5.81)
RDW: 25.2 % — ABNORMAL HIGH (ref 11.5–15.5)
WBC: 7.3 10*3/uL (ref 4.0–10.5)
nRBC: 0 % (ref 0.0–0.2)
nRBC: 0 /100 WBC

## 2019-05-25 LAB — MAGNESIUM: Magnesium: 1.9 mg/dL (ref 1.7–2.4)

## 2019-05-25 LAB — GLUCOSE, CAPILLARY
Glucose-Capillary: 128 mg/dL — ABNORMAL HIGH (ref 70–99)
Glucose-Capillary: 90 mg/dL (ref 70–99)

## 2019-05-25 MED ORDER — SYMTUZA 800-150-200-10 MG PO TABS: 1.0000 | ORAL_TABLET | Freq: Every day | ORAL | 0 refills | Status: DC

## 2019-05-25 MED ORDER — TIVICAY 50 MG PO TABS: 50.0000 mg | ORAL_TABLET | Freq: Every day | ORAL | 0 refills | Status: DC

## 2019-05-25 MED ORDER — LINEZOLID 600 MG PO TABS: 600.0000 mg | ORAL_TABLET | Freq: Two times a day (BID) | ORAL | 0 refills | Status: AC

## 2019-05-25 NOTE — Progress Notes (Addendum)
    [facility]    Date of Admission:  05/17/2019   Total days of antibiotics 9           ID: [name] is a 49 y.o. 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 Darunavir-Cobicisctat-Emtricitabine-Tenofovir Alafenamide  1 tablet Oral Q breakfast  . dolutegravir  50 mg Oral Daily  . enoxaparin (LOVENOX) injection  40 mg Subcutaneous Q24H  . ferrous sulfate  325 mg Oral Q breakfast  . guaiFENesin  600 mg Oral BID  . insulin aspart  0-9 Units Subcutaneous TID WC  . sulfamethoxazole-trimethoprim  1 tablet Oral Daily    Objective: Vital signs in last 24 hours: Temp:  [98 F (36.7 C)-98.3 F (36.8 C)] 98.3 F (36.8 C) (03/11 1624) Pulse Rate:  [85-101] 85 (03/11 1624) Resp:  [18] 18 (03/11 1624) BP: (101-105)/(62-65) 101/64 (03/11 1624) SpO2:  [91 %-95 %] 95 % (03/11 1624) Weight:  [74.8 kg] 74.8 kg (03/10 2105) Physical Exam  Constitutional: He is oriented to person, place, and time. He appears well-developed and well-nourished. No distress.  HENT:  Mouth/Throat: Oropharynx is clear and moist. No oropharyngeal exudate.  Cardiovascular: Normal rate, regular rhythm and normal heart sounds. Exam reveals no gallop and no friction rub.  No murmur heard.  Pulmonary/Chest: Effort normal and breath sounds normal. No respiratory distress. He has no wheezes.  Abdominal: Soft. Bowel sounds are normal. He exhibits no distension. There is no tenderness.  Lymphadenopathy:  He has no cervical adenopathy.  Neurological: He is alert and oriented to person, place, and time.  Skin: Skin is warm and dry. No rash noted. No erythema.  Psychiatric: He has a normal mood and affect. His behavior is normal.      Lab Results Recent Labs    05/24/19 0443 05/25/19 0431  WBC 7.4 7.3  HGB 9.8* 10.0*  HCT 33.4* 33.7*  NA 134* 135  K 3.8 4.4  CL 101 101  CO2 26 25  BUN 28* 19  CREATININE 1.22 1.47*    Microbiology: 3/5 blood cx ngtd  Studies/Results: TEE negative for vegetation  Assessment/Plan: mssa pneumonia with secondary bacteremia = will plan to change to 7 more days of treatment upon discharge tomorrow to finish 14 day course of tx. Plan to give linezolid 600mg  po bid. Continue on cefazolin til discharge.  hiv disease= restarted on tivicay plus symtuza   oi proph = to take bactrim ds daily  Will have him follow up in the ID clinic in 2-3 wk  [facility] Cell: [phone] Pager: [phone]  05/25/2019, 6:01 PM

## 2019-05-25 NOTE — Plan of Care (Signed)
  Problem: Clinical Measurements: Goal: Will remain free from infection Outcome: Progressing   Problem: Coping: Goal: Level of anxiety will decrease Outcome: Progressing   

## 2019-05-25 NOTE — TOC Initial Note (Signed)
Transition of Care [facility]) - Initial/Assessment Note    Patient Details  Name: [name] MRN: [id number] Date of Birth: 1971-01-03  Transition of Care [facility]) CM/SW Contact:    Bess Kinds, RN Phone Number: [phone] 05/25/2019, 12:30 PM  Clinical Narrative:                 Spoke with patient at the bedside using Language Line interpreter - swahili.   Discussed patient discharge home tomorrow per MD - patient states that he needs to discharge around 10 am d/t scheduling needs of his ride. He also states that he needs a work note stating it is ok for him to return to work and that he is covid negative - he asks that the note have a written signature. MD made aware of patient need.   Discussed going to [facility] to get set up for primary care. NCM confirmed the practice is accepting new patients, and patient will need to pick up new patient packet. Advised that the packets are only in [place]. Patient aware and states that he will follow as long as he has contact information. Follow up provider information has been placed on AVS.   TOC filled discharge medication. Medication administration reviewed with interpreter and patient verbalized understanding.   TOC following for transition needs.   Expected Discharge Plan: Home/Self Care Barriers to Discharge: Continued Medical Work up   Patient Goals and CMS Choice   CMS Medicare.gov Compare Post Acute Care list provided to:: Patient Choice offered to / list presented to : NA  Expected Discharge Plan and Services Expected Discharge Plan: Home/Self Care   Discharge Planning Services: CM Consult Post Acute Care Choice: NA Living arrangements for the past 2 months: Apartment                 DME Arranged: N/A DME Agency: NA       HH Arranged: NA HH Agency: NA        Prior Living Arrangements/Services Living arrangements for the past 2 months: Apartment Lives with:: Self Patient language and need for  interpreter reviewed:: Yes              Criminal Activity/Legal Involvement Pertinent to Current Situation/Hospitalization: No - Comment as needed  Activities of Daily Living Home Assistive Devices/Equipment: Ostomy supplies ADL Screening (condition at time of admission) Patient's cognitive ability adequate to safely complete daily activities?: Yes Is the patient deaf or have difficulty hearing?: No Does the patient have difficulty seeing, even when wearing glasses/contacts?: No Does the patient have difficulty concentrating, remembering, or making decisions?: No Patient able to express need for assistance with ADLs?: Yes Does the patient have difficulty dressing or bathing?: No Independently performs ADLs?: Yes (appropriate for developmental age) Does the patient have difficulty walking or climbing stairs?: No Weakness of Legs: None Weakness of Arms/Hands: None  Permission Sought/Granted                  Emotional Assessment Appearance:: Appears stated age Attitude/Demeanor/Rapport: Engaged Affect (typically observed): Accepting Orientation: : Oriented to Self, Oriented to  Time, Oriented to Place, Oriented to Situation Alcohol / Substance Use: Not Applicable Psych Involvement: No (comment)  Admission diagnosis:  Acute respiratory failure with hypoxia ([name]) [J96.01] Multifocal pneumonia [J18.9] Patient Active Problem List   Diagnosis Date Noted  . MSSA bacteremia   . Acute respiratory failure with hypoxia ([name]) 05/17/2019  . Type 2 diabetes mellitus ([name]) 05/17/2019  . DKA (diabetic ketoacidoses) (  [place]) 11/10/2018  . Pulmonary emphysema ([place])   . 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 Chronic systolic CHF (congestive heart failure) ([place]) 04/24/2016  . AKI (acute kidney injury) ([place]) 04/24/2016  . 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[place])   . Normochromic normocytic anemia 02/25/2016  .  Protein-calorie malnutrition, severe 02/25/2016  . Renal insufficiency   . S/P ORIF (open reduction internal fixation) fracture 03/21/2014  . HIV (human immunodeficiency virus infection) ([place]) 09/18/2013  . Refugee health examination 09/18/2013   PCP:  Dickie La, MD Pharmacy:   [facility] [place], [address] [place] [address] Phone: [phone] Fax: [phone]  [place], [place] - [address] [facility] [address] [place] [address] Phone: [phone] Fax: [place], [place] - [address] [place] [address] Phone: [phone] Fax: [phone]  [id number] - [place], [place] - [address] [place] [address] Phone: [phone] Fax: [phone]  [facility] [place], [place] - [address] [place] [address] Phone: [phone] Fax: [phone]     Social Determinants of Health (SDOH) Interventions    Readmission Risk Interventions No flowsheet data found.

## 2019-05-25 NOTE — Plan of Care (Signed)
  Problem: Health Behavior/Discharge Planning: Goal: Ability to manage health-related needs will improve Outcome: Adequate for Discharge   Problem: Clinical Measurements: Goal: Will remain free from infection 05/25/2019 1122 by Katrina Stack, RN Outcome: Adequate for Discharge 05/25/2019 0727 by Katrina Stack, RN Outcome: Progressing Goal: Diagnostic test results will improve Outcome: Adequate for Discharge Goal: Cardiovascular complication will be avoided Outcome: Adequate for Discharge   Problem: Coping: Goal: Level of anxiety will decrease 05/25/2019 1122 by Katrina Stack, RN Outcome: Adequate for Discharge 05/25/2019 0727 by Katrina Stack, RN Outcome: Progressing   Problem: Elimination: Goal: Will not experience complications related to bowel motility Outcome: Adequate for Discharge Goal: Will not experience complications related to urinary retention Outcome: Adequate for Discharge   Problem: Safety: Goal: Ability to remain free from injury will improve Outcome: Adequate for Discharge   Problem: Elimination: Goal: Will not experience complications related to bowel motility Outcome: Adequate for Discharge Goal: Will not experience complications related to urinary retention Outcome: Adequate for Discharge

## 2019-05-25 NOTE — Progress Notes (Signed)
Pharmacy Antibiotic Note  [name] is a 49 y.o. male admitted on 05/17/2019 with acute respiratory failure with concern for CAP and pneumocystis pneumonia. PMH significant for HIV, has been off ART for 6 months, CD4 count very low 64 on admission. Blood cx is growing 1/4 MSSA. Pharmacy consulted to dose cefazolin.  Today is abx D9/14 planned by ID. TEE negative for endocarditis and MRI negative for discitis. Per ID - may consider transitioning to Zyvox for discharge.   Plan: Continue cefazolin 2 gm IV q8h Bactrim DS PO for PJP prophylaxis per ID F/u c/s, renal function, and clinical improvement ID team following  Height: 6\' 1"  (185.4 cm) Weight: 164 lb 14.7 oz (74.8 kg) IBW/kg (Calculated) : 79.9  Temp (24hrs), Avg:98.1 F (36.7 C), Min:98 F (36.7 C), Max:98.2 F (36.8 C)  Recent Labs  Lab 05/21/19 0547 05/22/19 0436 05/22/19 0600 05/23/19 0513 05/24/19 0443 05/25/19 0431  WBC 10.6* 11.5*  --  7.4 7.4 7.3  CREATININE 1.23  --  1.36* 1.39* 1.22 1.47*    Estimated Creatinine Clearance: 65 mL/min (A) (by C-G formula based on SCr of 1.47 mg/dL (H)).    No Known Allergies  Antimicrobials this admission: 3/3 ceftriaxone >>3/4 3/3 azithromycin >>3/4 3/3 bactrimIV>>3/4 PO for PJP prophylaxis >> 3/4 Vanc >> 3/6 3/4 cefazolin >>  Microbiology results: 3/3 BCx - GPC in clusters in 1/4 - MSSA 3/3 PCP - neg 3/3 strep pneumo - neg 3/5 BCx: ngtd  Thank you for allowing pharmacy to be a part of this patient's care.  07/25/19, PharmD, BCPS Clinical Pharmacist Clinical phone for 05/25/2019: 07/25/2019 05/25/2019 10:13 AM   **Pharmacist phone directory can now be found on [email or web address] ([email or web address]).  Listed under [facility].

## 2019-05-25 NOTE — Progress Notes (Signed)
Current [id number]

## 2019-05-25 NOTE — Plan of Care (Signed)
  Problem: Health Behavior/Discharge Planning: Goal: Ability to manage health-related needs will improve Outcome: Adequate for Discharge   Problem: Clinical Measurements: Goal: Will remain free from infection Outcome: Adequate for Discharge Goal: Diagnostic test results will improve Outcome: Adequate for Discharge Goal: Cardiovascular complication will be avoided Outcome: Adequate for Discharge   Problem: Coping: Goal: Level of anxiety will decrease Outcome: Adequate for Discharge   Problem: Elimination: Goal: Will not experience complications related to bowel motility Outcome: Adequate for Discharge   Problem: Safety: Goal: Ability to remain free from injury will improve Outcome: Adequate for Discharge

## 2019-05-25 NOTE — Progress Notes (Signed)
Patient ID: [name], male   DOB: 01-Feb-1971, 49 y.o.   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  CTA chest PE was negative for acute PE but showed extensive left upper lobe consolidation along with chronic lung changes bilaterally with moderate emphysema and extensive bronchiectasis with mucous plugging in the left lower lobe.  ID was consulted.  Patient was started on broad-spectrum antibiotics as well as IV Bactrim as per ID recommendations for suspected pneumocystis pneumonia.  During the hospitalization, he was also found to have MSSA bacteremia.  Antibiotics were switched to IV Ancef.  He underwent TEE on 05/23/2019 which was negative for vegetations.  Assessment & Plan:   Acute hypoxic respiratory failure MSSA pneumonia -CTA chest PE study is negative for acute PE. Extensive left upper lobe consolidation is noted as well as chronic lung changes bilaterally with moderate emphysema and extensive bronchiectasis with mucous plugging in the left lower lobe -Patient was initially started on broad-spectrum antibiotics as well as IV Bactrim for suspected pneumocystis pneumonia.  PJP sputum negative -Subsequently he was found to have MSSA bacteremia.  He is currently on IV Ancef.  ID following and recommends to continue Ancef till today and switch to oral Zyvox tomorrow to finish 14-day course of therapy. -Currently on room air  MSSA bacteremia -Repeat blood cultures negative so far. -TTE showed EF of 60 to 65% with no evidence of vegetation -TEE on 05/23/2019 was negative for any  vegetation/thrombus -MRI lumbar spine negative for any discitis/osteomyelitis -Antibiotic plan as above.  Acute kidney injury -Status post IV fluid treatment.  Creatinine stable.  HIV/AIDS Noncompliance with antiretroviral meds -CD4 of 64 and HIV viral load of 840 -Patient has been started on antiretrovirals by ID.  Patient needs to be compliant with medications and follow-up  Anemia of chronic disease -Hemoglobin stable.  Continue oral iron supplementation.  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 Byrd Hesselbach at [facility] -Continue ostomy care  Type 2 diabetes mellitus -A1c 5.4 -SSI, Accu-Cheks, hypoglycemic protocol -Patient not taking home Janumet and glipizide per report    DVT prophylaxis: Lovenox Code Status: Full Family Communication: Spoke to patient at bedside Disposition Plan: Home tomorrow once cleared by ID and switched to oral Zyvox   Consultants: ID/cardiology  Procedures:  2D echo TEE on 05/23/2019 Antimicrobials:  Anti-infectives (From admission, onward)   Start     Dose/Rate Route Frequency Ordered Stop   05/20/19 1000  sulfamethoxazole-trimethoprim (BACTRIM DS) 800-160 MG per tablet 1 tablet     1 tablet Oral Daily 05/19/19 1023     05/20/19 0800  Darunavir-Cobicisctat-Emtricitabine-Tenofovir Alafenamide (SYMTUZA) 800-150-200-10 MG TABS 1 tablet     1 tablet Oral Daily with breakfast 05/19/19 1826     05/19/19 2300  vancomycin (VANCOREADY) IVPB 1250 mg/250 mL  Status:  Discontinued     1,250 mg 166.7 mL/hr over 90 Minutes Intravenous Every 12 hours 05/19/19 1023 05/20/19 1250   05/19/19 1830  dolutegravir (TIVICAY) tablet 50 mg     50 mg Oral Daily 05/19/19 1826  05/19/19 1400  ceFAZolin (ANCEF) IVPB 2g/100 mL premix     2 g 200 mL/hr over 30 Minutes Intravenous Every 8 hours 05/19/19 1023     05/19/19 1100  vancomycin (VANCOREADY) IVPB 1500 mg/300 mL      1,500 mg 150 mL/hr over 120 Minutes Intravenous  Once 05/19/19 1023 05/19/19 1311   05/19/19 1015  sulfamethoxazole-trimethoprim (BACTRIM DS) 800-160 MG per tablet 1 tablet  Status:  Discontinued     1 tablet Oral Daily 05/19/19 1013 05/19/19 1023   05/18/19 1800  azithromycin (ZITHROMAX) 500 mg in sodium chloride 0.9 % 250 mL IVPB  Status:  Discontinued     500 mg 250 mL/hr over 60 Minutes Intravenous Every 24 hours 05/17/19 2044 05/19/19 1013   05/18/19 1800  cefTRIAXone (ROCEPHIN) 1 g in sodium chloride 0.9 % 100 mL IVPB  Status:  Discontinued     1 g 200 mL/hr over 30 Minutes Intravenous Every 24 hours 05/17/19 2044 05/19/19 1013   05/17/19 1715  sulfamethoxazole-trimethoprim (BACTRIM) 375 mg in dextrose 5 % 500 mL IVPB  Status:  Discontinued     375 mg 349 mL/hr over 90 Minutes Intravenous Every 8 hours 05/17/19 1700 05/19/19 1013   05/17/19 1630  cefTRIAXone (ROCEPHIN) 1 g in sodium chloride 0.9 % 100 mL IVPB     1 g 200 mL/hr over 30 Minutes Intravenous  Once 05/17/19 1617 05/17/19 1940   05/17/19 1630  azithromycin (ZITHROMAX) 500 mg in sodium chloride 0.9 % 250 mL IVPB     500 mg 250 mL/hr over 60 Minutes Intravenous  Once 05/17/19 1617 05/17/19 1940       Subjective: Patient seen and examined at bedside.  Poor historian.  Denies worsening fever, shortness of breath or cough. Objective: Vitals:   05/24/19 0937 05/24/19 1632 05/24/19 2105 05/25/19 0453  BP: 102/67 106/65 101/62 103/64  Pulse: 95 93 98 100  Resp: 18 18 18 18   Temp: 98.2 F (36.8 C) 98.2 F (36.8 C) 98.2 F (36.8 C) 98 F (36.7 C)  TempSrc: Oral Oral Oral Oral  SpO2: 93% 94% 94% 91%  Weight:   74.8 kg   Height:        Intake/Output Summary (Last 24 hours) at 05/25/2019 0747 Last data filed at 05/25/2019 0601 Gross per 24 hour  Intake 1051.49 ml  Output 2225 ml  Net -1173.51 ml   Filed Weights   05/21/19 2109 05/22/19 2010 05/24/19 2105  Weight: 77.4 kg 74.8 kg 74.8 kg     Examination:  General exam: No distress.  Poor historian.  Chronically ill. Thinly built male lying in bed. Respiratory system: Bilateral decreased breath sounds at bases, no wheezing cardiovascular system: Rate controlled, S1-S2 heard Gastrointestinal system: Abdomen is nondistended, soft and nontender.  Bowel sounds are heard  extremities: No cyanosis or edema     Data Reviewed: I have personally reviewed following labs and imaging studies  CBC: Recent Labs  Lab 05/21/19 0547 05/22/19 0436 05/23/19 0513 05/24/19 0443 05/25/19 0431  WBC 10.6* 11.5* 7.4 7.4 7.3  NEUTROABS 6.5 6.7 3.5 3.7 PENDING  HGB 9.5* 10.2* 10.0* 9.8* 10.0*  HCT 31.9* 33.3* 33.7* 33.4* 33.7*  MCV 83.1 81.8 84.3 83.7 84.3  PLT 365 406* 404* 349 321   Basic Metabolic Panel: Recent Labs  Lab 05/21/19 0547 05/22/19 0600 05/23/19 0513 05/24/19 0443 05/25/19 0431  NA 134* 134* 134* 134* 135  K 4.3 4.4 4.1 3.8 4.4  CL 101 102 100 101 101  CO2  24 23 24 26 25   GLUCOSE 207* 150* 133* 150* 118*  BUN 24* 31* 34* 28* 19  CREATININE 1.23 1.36* 1.39* 1.22 1.47*  CALCIUM 9.3 9.2 9.0 8.8* 8.8*  MG  --   --   --   --  1.9   GFR: Estimated Creatinine Clearance: 65 mL/min (A) (by C-G formula based on SCr of 1.47 mg/dL (H)). Liver Function Tests: No results for input(s): AST, ALT, ALKPHOS, BILITOT, PROT, ALBUMIN in the last 168 hours. No results for input(s): LIPASE, AMYLASE in the last 168 hours. No results for input(s): AMMONIA in the last 168 hours. Coagulation Profile: No results for input(s): INR, PROTIME in the last 168 hours. Cardiac Enzymes: No results for input(s): CKTOTAL, CKMB, CKMBINDEX, TROPONINI in the last 168 hours. BNP (last 3 results) No results for input(s): PROBNP in the last 8760 hours. HbA1C: No results for input(s): HGBA1C in the last 72 hours. CBG: Recent Labs  Lab 05/24/19 0730 05/24/19 1118 05/24/19 1633 05/24/19 2106 05/25/19 0720  GLUCAP 147* 145* 170* 172* 128*    Lipid Profile: No results for input(s): CHOL, HDL, LDLCALC, TRIG, CHOLHDL, LDLDIRECT in the last 72 hours. Thyroid Function Tests: No results for input(s): TSH, T4TOTAL, FREET4, T3FREE, THYROIDAB in the last 72 hours. Anemia Panel: No results for input(s): VITAMINB12, FOLATE, FERRITIN, TIBC, IRON, RETICCTPCT in the last 72 hours. Sepsis Labs: No results for input(s): PROCALCITON, LATICACIDVEN in the last 168 hours.  Recent Results (from the past 240 hour(s))  Culture, blood (Routine x 2)     Status: None   Collection Time: 05/17/19 12:23 PM   Specimen: BLOOD  Result Value Ref Range Status   Specimen Description BLOOD RIGHT ANTECUBITAL  Final   Special Requests   Final    BOTTLES DRAWN AEROBIC AND ANAEROBIC Blood Culture adequate volume   Culture   Final    NO GROWTH 5 DAYS Performed at [facility], [address], [place], [place] [address]    Report Status 05/22/2019 FINAL  Final  Culture, blood (Routine x 2)     Status: Abnormal   Collection Time: 05/17/19  3:45 PM   Specimen: BLOOD RIGHT ARM  Result Value Ref Range Status   Specimen Description BLOOD RIGHT ARM  Final   Special Requests   Final    BOTTLES DRAWN AEROBIC AND ANAEROBIC Blood Culture results may not be optimal due to an inadequate volume of blood received in culture bottles   Culture  Setup Time   Final    GRAM POSITIVE COCCI IN CLUSTERS ANAEROBIC BOTTLE ONLY CRITICAL RESULT CALLED TO, READ BACK BY AND VERIFIED WITH: J. STEENWYK, PHARMD AT 0830 ON 05/18/19 BY C. JESSUP, MT. Performed at [facility], [place] [address], [place], [place] [address]    Culture STAPHYLOCOCCUS AUREUS (A)  Final   Report Status 05/20/2019 FINAL  Final   Organism ID, Bacteria STAPHYLOCOCCUS AUREUS  Final      Susceptibility   Staphylococcus aureus - MIC*    CIPROFLOXACIN >=8 RESISTANT Resistant     ERYTHROMYCIN >=8 RESISTANT Resistant     GENTAMICIN <=0.5 SENSITIVE Sensitive     OXACILLIN <=0.25 SENSITIVE Sensitive      TETRACYCLINE <=1 SENSITIVE Sensitive     VANCOMYCIN <=0.5 SENSITIVE Sensitive     TRIMETH/SULFA >=320 RESISTANT Resistant     CLINDAMYCIN RESISTANT Resistant     RIFAMPIN <=0.5 SENSITIVE Sensitive     Inducible Clindamycin POSITIVE Resistant     * STAPHYLOCOCCUS AUREUS  SARS CORONAVIRUS 2 (TAT 6-24 HRS) Nasopharyngeal Nasopharyngeal Swab     Status: None   Collection Time: 05/17/19  7:48 PM   Specimen: Nasopharyngeal Swab  Result Value Ref Range Status   SARS Coronavirus 2 NEGATIVE NEGATIVE Final    Comment: (NOTE) SARS-CoV-2 target nucleic acids are NOT DETECTED. The SARS-CoV-2 RNA is generally detectable in upper and lower respiratory specimens during the acute phase of infection. Negative results do not preclude SARS-CoV-2 infection, do not rule out co-infections with other pathogens, and should not be used as the sole basis for treatment or other patient management decisions. Negative results must be combined with clinical observations, patient history, and epidemiological information. The expected result is Negative. Fact Sheet for Patients: HairSlick.no Fact Sheet for Healthcare Providers: quierodirigir.com This test is not yet approved or cleared by the [place] FDA and  has been authorized for detection and/or diagnosis of SARS-CoV-2 by FDA under an Emergency Use Authorization (EUA). This EUA will remain  in effect (meaning this test can be used) for the duration of the COVID-19 declaration under Section 56 4(b)(1) of the Act, 21 U.S.C. section 360bbb-3(b)(1), unless the authorization is terminated or revoked sooner. Performed at [facility], [address] [place], [place] [address]   Expectorated sputum assessment w rflx to resp cult     Status: None   Collection Time: 05/17/19  7:57 PM   Specimen: Expectorated Sputum  Result Value Ref Range Status   Specimen Description EXPECTORATED SPUTUM  Final    Special Requests NONE  Final   Sputum evaluation   Final    Sputum specimen not acceptable for testing.  Please recollect.   RESULT CALLED TO, READ BACK BY AND VERIFIED WITH: Jerelyn Scott RN 05/17/19 2112 JDW Performed at [facility], [address], [place], [place] [address]    Report Status 05/17/2019 FINAL  Final  Pneumocystis smear by DFA     Status: None   Collection Time: 05/17/19  7:57 PM   Specimen: Sputum; Respiratory  Result Value Ref Range Status   Specimen Source-PJSRC EXPECTORATED SPUTUM  Final   Pneumocystis jiroveci Ag NEGATIVE  Final    Comment: Performed at [facility] Performed at [facility], [address] [place], [place] [address]   Culture, blood (routine x 2)     Status: None   Collection Time: 05/19/19  4:39 PM   Specimen: BLOOD RIGHT HAND  Result Value Ref Range Status   Specimen Description BLOOD RIGHT HAND  Final   Special Requests   Final    BOTTLES DRAWN AEROBIC ONLY Blood Culture adequate volume   Culture   Final    NO GROWTH 5 DAYS Performed at [facility], [address], [place], [place] [address]    Report Status 05/24/2019 FINAL  Final  Culture, blood (routine x 2)     Status: None   Collection Time: 05/19/19  4:43 PM   Specimen: BLOOD LEFT HAND  Result Value Ref Range Status   Specimen Description BLOOD LEFT HAND  Final   Special Requests   Final    BOTTLES DRAWN AEROBIC ONLY Blood Culture adequate volume   Culture   Final    NO GROWTH 5 DAYS Performed at [facility], [address] [place], [place] [address]    Report Status 05/24/2019 FINAL  Final         Radiology Studies: MR Lumbar Spine W Wo Contrast  Result Date:  05/23/2019 CLINICAL DATA:  Fevers, chills. Evaluate for discitis. EXAM: MRI LUMBAR SPINE WITHOUT AND WITH CONTRAST TECHNIQUE: Multiplanar and multiecho pulse sequences of the lumbar spine were obtained without and with intravenous contrast. CONTRAST:  7.72mL GADAVIST GADOBUTROL 1 MMOL/ML  IV SOLN COMPARISON:  None. FINDINGS: Segmentation:  Standard. Alignment:  Physiologic. Vertebrae:  No fracture, evidence of discitis, or bone lesion. Conus medullaris and cauda equina: Conus extends to the L1 level. Conus and cauda equina appear normal. Paraspinal and other soft tissues: Negative. Disc levels: L1-2: No spinal canal or neural foraminal stenosis. L2-3: No spinal canal or neural foraminal stenosis. L3-4: No spinal canal or neural foraminal stenosis. L4-5: No spinal canal or neural foraminal stenosis. L5-S1: Small central disc protrusion and mild facet degenerative changes. No spinal canal neural foraminal stenosis. IMPRESSION: 1. No evidence of discitis/osteomyelitis. 2. Mild degenerative changes at L5-S1. No spinal canal neural foraminal stenosis. Electronically Signed   By: Baldemar Lenis M.D.   On: 05/23/2019 12:27   ECHO TEE  Result Date: 05/24/2019    TRANSESOPHOGEAL ECHO REPORT   Patient Name:   [name] Date of Exam: 05/23/2019 Medical Rec #:  [id number]          Height:       73.0 in Accession #:    [id number]         Weight:       164.9 lb Date of Birth:  22-Apr-1970           BSA:          1.982 m Patient Age:    49 years           BP:           90/78 mmHg Patient Gender: M                  HR:           101 bpm. Exam Location:  Inpatient Procedure: Transesophageal Echo, Cardiac Doppler and Color Doppler Indications:     Bacteremia  History:         Patient has prior history of Echocardiogram examinations, most                  recent 05/21/2019. CHF, COPD, Signs/Symptoms:Dyspnea; Risk                  Factors:Diabetes. HIV. Pneumonia. Respiratory failure.                  Tuberculosis.  Sonographer:     Sheralyn Boatman RDCS Referring Phys:  [id number] Corrin Parker Diagnosing Phys: Chilton Si MD PROCEDURE: The transesophogeal probe was passed without difficulty through the esophogus of the patient. Imaged were obtained with the patient in a left lateral decubitus  position. Sedation performed by different physician. The patient was monitored while under deep sedation. Anesthestetic sedation was provided intravenously by Anesthesiology: 655mg  of Propofol. The patient's vital signs; including heart rate, blood pressure, and oxygen saturation; remained stable throughout the procedure. The patient developed no complications during the procedure. IMPRESSIONS  1. Left ventricular ejection fraction, by estimation, is 55 to 60%. The left ventricle has normal function. The left ventricle has no regional wall motion abnormalities.  2. Right ventricular systolic function is normal. The right ventricular size is normal.  3. No left atrial/left atrial appendage thrombus was detected. The LAA emptying velocity was 84 cm/s.  4. The mitral valve is normal in structure. Mild mitral valve  regurgitation. No evidence of mitral stenosis.  5. The aortic valve is tricuspid. Aortic valve regurgitation is not visualized. No aortic stenosis is present.  6. The inferior vena cava is normal in size with greater than 50% respiratory variability, suggesting right atrial pressure of 3 mmHg. Conclusion(s)/Recommendation(s): Normal biventricular function without evidence of hemodynamically significant valvular heart disease. No evidence of vegetation/infective endocarditis on this transesophageal echocardiogram. FINDINGS  Left Ventricle: Left ventricular ejection fraction, by estimation, is 55 to 60%. The left ventricle has normal function. The left ventricle has no regional wall motion abnormalities. The left ventricular internal cavity size was normal in size. There is  no left ventricular hypertrophy. Right Ventricle: The right ventricular size is normal. No increase in right ventricular wall thickness. Right ventricular systolic function is normal. Left Atrium: Left atrial size was normal in size. No left atrial/left atrial appendage thrombus was detected. The LAA emptying velocity was 84 cm/s. Right  Atrium: Right atrial size was normal in size. Pericardium: There is no evidence of pericardial effusion. Mitral Valve: Multiple mitral regurgitation jets. The mitral valve is normal in structure. Normal mobility of the mitral valve leaflets. Mild mitral valve regurgitation. No evidence of mitral valve stenosis. Tricuspid Valve: The tricuspid valve is normal in structure. Tricuspid valve regurgitation is trivial. No evidence of tricuspid stenosis. Aortic Valve: The aortic valve is tricuspid. Aortic valve regurgitation is not visualized. No aortic stenosis is present. Pulmonic Valve: The pulmonic valve was normal in structure. Pulmonic valve regurgitation is not visualized. No evidence of pulmonic stenosis. Aorta: The aortic root is normal in size and structure. Venous: The inferior vena cava is normal in size with greater than 50% respiratory variability, suggesting right atrial pressure of 3 mmHg. IAS/Shunts: No atrial level shunt detected by color flow Doppler. Chilton Si MD Electronically signed by Chilton Si MD Signature Date/Time: 05/24/2019/10:56:07 AM    Final         Scheduled Meds: . Darunavir-Cobicisctat-Emtricitabine-Tenofovir Alafenamide  1 tablet Oral Q breakfast  . dolutegravir  50 mg Oral Daily  . enoxaparin (LOVENOX) injection  40 mg Subcutaneous Q24H  . ferrous sulfate  325 mg Oral Q breakfast  . guaiFENesin  600 mg Oral BID  . insulin aspart  0-9 Units Subcutaneous TID WC  . sulfamethoxazole-trimethoprim  1 tablet Oral Daily   Continuous Infusions: . sodium chloride 20 mL/hr at 05/24/19 1700  .  ceFAZolin (ANCEF) IV 2 g (05/25/19 7124)          Glade Lloyd, MD [facility] 05/25/2019, 7:47 AM

## 2019-05-26 LAB — BASIC METABOLIC PANEL
Anion gap: 7 (ref 5–15)
BUN: 18 mg/dL (ref 6–20)
CO2: 25 mmol/L (ref 22–32)
Calcium: 8.6 mg/dL — ABNORMAL LOW (ref 8.9–10.3)
Chloride: 102 mmol/L (ref 98–111)
Creatinine, Ser: 1.31 mg/dL — ABNORMAL HIGH (ref 0.61–1.24)
GFR calc Af Amer: >60 mL/min (ref >60)
GFR calc non Af Amer: >60 mL/min (ref >60)
Glucose, Bld: 207 mg/dL — ABNORMAL HIGH (ref 70–99)
Potassium: 3.4 mmol/L — ABNORMAL LOW (ref 3.5–5.1)
Sodium: 134 mmol/L — ABNORMAL LOW (ref 135–145)

## 2019-05-26 LAB — CBC WITH DIFFERENTIAL/PLATELET
Abs Immature Granulocytes: 0.08 10*3/uL — ABNORMAL HIGH (ref 0.00–0.07)
Basophils Absolute: 0 10*3/uL (ref 0.0–0.1)
Basophils Relative: 1 %
Eosinophils Absolute: 0.4 10*3/uL (ref 0.0–0.5)
Eosinophils Relative: 6 %
HCT: 32.4 % — ABNORMAL LOW (ref 39.0–52.0)
Hemoglobin: 9.7 g/dL — ABNORMAL LOW (ref 13.0–17.0)
Immature Granulocytes: 2 %
Lymphocytes Relative: 38 %
Lymphs Abs: 2.1 10*3/uL (ref 0.7–4.0)
MCH: 25.2 pg — ABNORMAL LOW (ref 26.0–34.0)
MCHC: 29.9 g/dL — ABNORMAL LOW (ref 30.0–36.0)
MCV: 84.2 fL (ref 80.0–100.0)
Monocytes Absolute: 0.7 10*3/uL (ref 0.1–1.0)
Monocytes Relative: 13 %
Neutro Abs: 2.2 10*3/uL (ref 1.7–7.7)
Neutrophils Relative %: 40 %
Platelets: 277 10*3/uL (ref 150–400)
RBC: 3.85 MIL/uL — ABNORMAL LOW (ref 4.22–5.81)
RDW: 25.3 % — ABNORMAL HIGH (ref 11.5–15.5)
WBC: 5.5 10*3/uL (ref 4.0–10.5)
nRBC: 0 % (ref 0.0–0.2)

## 2019-05-26 LAB — GLUCOSE, CAPILLARY
Glucose-Capillary: 115 mg/dL — ABNORMAL HIGH (ref 70–99)
Glucose-Capillary: 171 mg/dL — ABNORMAL HIGH (ref 70–99)
Glucose-Capillary: 204 mg/dL — ABNORMAL HIGH (ref 70–99)

## 2019-05-26 LAB — MAGNESIUM: Magnesium: 1.9 mg/dL (ref 1.7–2.4)

## 2019-05-26 MED ORDER — SULFAMETHOXAZOLE-TRIMETHOPRIM 800-160 MG PO TABS: 1.0000 | ORAL_TABLET | Freq: Every day | ORAL | 0 refills | Status: DC

## 2019-05-26 MED ORDER — GUAIFENESIN ER 600 MG PO TB12: 600.0000 mg | ORAL_TABLET | Freq: Two times a day (BID) | ORAL | 0 refills | Status: DC

## 2019-05-26 MED ORDER — LINEZOLID 600 MG PO TABS
600.0000 mg | ORAL_TABLET | Freq: Two times a day (BID) | ORAL | Status: DC
  Administered 2019-05-26: 600 mg via ORAL
  Filled 2019-05-26: qty 1

## 2019-05-26 MED ORDER — FERROUS SULFATE 325 (65 FE) MG PO TABS: 325.0000 mg | ORAL_TABLET | Freq: Every day | ORAL | 0 refills | Status: DC

## 2019-05-26 NOTE — Progress Notes (Signed)
[name] to be discharged home per MD order. Discussed prescriptions and follow up appointments with the patient. Prescriptions given to patient; medication list explained in detail. Patient verbalized understanding.  Skin clean, dry and intact without evidence of skin break down, no evidence of skin tears noted. IV catheter discontinued intact. Site without signs and symptoms of complications. Dressing and pressure applied. Pt denies pain at the site currently. No complaints noted.  Patient free of lines, drains, and wounds.   An After Visit Summary (AVS) was printed and given to the patient. Patient escorted via wheelchair, and discharged home via private auto.  Gladstone Pih, RN

## 2019-05-26 NOTE — Plan of Care (Signed)
  Problem: Health Behavior/Discharge Planning: Goal: Ability to manage health-related needs will improve Outcome: Adequate for Discharge   Problem: Clinical Measurements: Goal: Will remain free from infection Outcome: Adequate for Discharge Goal: Diagnostic test results will improve Outcome: Adequate for Discharge Goal: Cardiovascular complication will be avoided Outcome: Adequate for Discharge   Problem: Coping: Goal: Level of anxiety will decrease 05/26/2019 1008 by Gladstone Pih, RN Outcome: Adequate for Discharge 05/26/2019 0828 by Gladstone Pih, RN Outcome: Progressing   Problem: Elimination: Goal: Will not experience complications related to bowel motility Outcome: Adequate for Discharge   Problem: Safety: Goal: Ability to remain free from injury will improve Outcome: Adequate for Discharge

## 2019-05-26 NOTE — TOC Transition Note (Signed)
Transition of Care [facility]) - CM/SW Discharge Note   Patient Details  Name: [name] MRN: [id number] Date of Birth: [birth year]  Transition [facility] Care [facility]) CM/SW Contact:  Bess Kinds, RN Phone Number: [phone] 05/26/2019, 11:04 AM   Clinical Narrative:    Spoke with patient at bedside with Language Line interpreter - [name]. Discussed discharge medication and advised per pharmacy that patient should take his Tivicay 2 hours before ferrous sulfate or 6 hours after. Patient able to verbalize understanding using teach back.  Additional ostomy bags provided and patient advised to follow up with new patient paperwork for [facility]. Patient's transportation arrived at 10 am for transition home. No further TOC needs identified.   Final next level of care: Home/Self Care Barriers to Discharge: No Barriers Identified   Patient Goals and CMS Choice   CMS Medicare.gov Compare Post Acute Care list provided to:: Patient Choice offered to / list presented to : NA  Discharge Placement                       Discharge Plan and Services   Discharge Planning Services: CM Consult Post Acute Care Choice: NA          DME Arranged: N/A DME Agency: NA       HH Arranged: NA HH Agency: NA        Social Determinants of Health (SDOH) Interventions     Readmission Risk Interventions No flowsheet data found.

## 2019-05-26 NOTE — Plan of Care (Signed)
°  Problem: Coping: °Goal: Level of anxiety will decrease °Outcome: Progressing °  °

## 2019-05-26 NOTE — Discharge Summary (Signed)
Physician Discharge Summary  [name] ZOX:[id number] DOB: 05-24-1970 DOA: 05/17/2019  PCP: [name], MD  Admit date: 05/17/2019 Discharge date: 05/26/2019  Admitted From: Home Disposition: Home  Recommendations for Outpatient Follow-up:  1. Follow up with PCP in 1 week with repeat CBC/BMP 2. Outpatient follow-up with ID 3. Comply with medications and follow-up 4. 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  CTA chest PE was negative for acute PE but showed extensive left upper lobe consolidation along with chronic lung changes bilaterally with moderate emphysema and extensive bronchiectasis with mucous plugging in the left lower lobe.  ID was consulted.  Patient was started on broad-spectrum antibiotics as well as IV Bactrim as per ID recommendations for suspected pneumocystis pneumonia.  During the hospitalization, he was also found to have MSSA bacteremia.  Antibiotics were switched to IV Ancef.  He underwent TEE on 05/23/2019 which was negative for vegetations.  During the hospitalization, his condition is improved.  He is on IV Ancef currently and ID has recommended to switch him to oral Zyvox for another week on discharge.  He will be discharged home today with outpatient follow-up with ID.  Discharge Diagnoses:   Acute hypoxic respiratory failure MSSA pneumonia -CTA chest PE study is negative for acute PE. Extensive left upper lobe consolidation is noted as well as chronic lung changes bilaterally with  moderate emphysema and extensive bronchiectasis with mucous plugging in the left lower lobe -Patient was initially started on broad-spectrum antibiotics as well as IV Bactrim for suspected pneumocystis pneumonia.  PJP sputum negative -Subsequently he was found to have MSSA bacteremia.  He is currently on IV Ancef.  ID has recommended to switch him to oral Zyvox for another week on discharge to finish a total of 14 days of antibiotic therapy.  He will be discharged home today with outpatient follow-up with ID. -Currently on room air  MSSA bacteremia -Repeat blood cultures negative so far. -TTE showed EF of 60 to 65% with no evidence of vegetation -TEE on 05/23/2019 was negative for any vegetation/thrombus -MRI lumbar spine negative for any discitis/osteomyelitis -Antibiotic plan as above.  Acute kidney injury -Status post IV fluid treatment.  Creatinine stable.  HIV/AIDS Noncompliance with antiretroviral meds -CD4 of 64 and HIV viral load of 840 -Patient has been started on antiretrovirals by ID.  Patient needs to be compliant with medications and follow-up -Outpatient follow-up with ID  Anemia of chronic disease -Hemoglobin stable.  Continue oral iron supplementation.  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 Byrd Hesselbach at [facility] -Continue ostomy care.  Outpatient follow-up  Type 2 diabetes mellitus -A1c 5.4 -Patient not taking home Janumet and glipizide per report -Carb modified diet.  Outpatient follow-up with PCP regarding initiation of oral medications.  Discharge Instructions  Discharge Instructions    Ambulatory referral to Infectious Disease   Complete by: As directed    AIDS   Diet - low sodium heart healthy   Complete by: As directed    Diet Carb Modified  Complete by: As directed    Increase activity slowly   Complete by: As directed      Allergies as of 05/26/2019    No Known Allergies     Medication List    STOP taking these medications   glipiZIDE 10 MG tablet Commonly known as: GLUCOTROL   Janumet 50-500 MG tablet Generic drug: sitaGLIPtin-metformin   lidocaine 5 % ointment Commonly known as: XYLOCAINE     TAKE these medications   ferrous sulfate 325 (65 FE) MG tablet Take 1 tablet (325 mg total) by mouth daily with breakfast. Start taking on: May 27, 2019   guaiFENesin 600 MG 12 hr tablet Commonly known as: MUCINEX Take 1 tablet (600 mg total) by mouth 2 (two) times daily.   linezolid 600 MG tablet Commonly known as: ZYVOX Take 1 tablet (600 mg total) by mouth 2 (two) times daily for 7 days.   sulfamethoxazole-trimethoprim 800-160 MG tablet Commonly known as: BACTRIM DS Take 1 tablet by mouth daily.   Symtuza 800-150-200-10 MG Tabs Generic drug: Darunavir-Cobicisctat-Emtricitabine-Tenofovir Alafenamide Take 1 tablet by mouth daily with breakfast.   Tivicay 50 MG tablet Generic drug: dolutegravir Take 1 tablet (50 mg total) by mouth daily.      Follow-up Information    Dickie La, MD. Go to.   Specialties: Family Medicine, Sports Medicine Why: office to pick up new patient information Contact information: [address] [place] [address] [phone]        Carlyle Basques, MD. Schedule an appointment as soon as possible for a visit in 2 week(s).   Specialty: Infectious Diseases Why: 3/26 at 9:30 am Contact information: [place] [address] [phone]          No Known Allergies  Consultations:  ID/cardiology   Procedures/Studies: DG Thoracic Spine 2 View  Result Date: 05/19/2019 CLINICAL DATA:  Left thoracolumbar back pain for 1 month. No known injury. EXAM: THORACIC SPINE 2 VIEWS COMPARISON:  Reformats from chest CT 2 days ago 05/17/2019 FINDINGS: Upper thoracic spine on the lateral view slightly limited by underlying pulmonary disease, no  abnormalities on recent chest CT. The alignment is maintained. Vertebral body heights are maintained. No significant disc space narrowing. No acute fracture. Posterior elements appear intact. There is no paravertebral soft tissue abnormality. Left upper lobe opacity assessed on recent chest CT. IMPRESSION: Negative radiographs of the thoracic spine. Electronically Signed   By: Keith Rake M.D.   On: 05/19/2019 19:41   DG Lumbar Spine 2-3 Views  Result Date: 05/19/2019 CLINICAL DATA:  Left thoracolumbar back pain for 1 month. No known injury. EXAM: LUMBAR SPINE - 2-3 VIEW COMPARISON:  Reformats from abdominal CT 01/10/2019. FINDINGS: The alignment is maintained. Vertebral body heights are normal. There is no listhesis. The posterior elements are intact. Mild endplate spurring at V5-I4 with preservation of disc spaces. No fracture. Sacroiliac joints are symmetric and normal. Non fusion posterior aspect of S1, normal variant. No evidence of focal bone lesion. IMPRESSION: Mild endplate spurring at P3-I9. Otherwise negative radiographs of the lumbar spine. Electronically Signed   By: Keith Rake M.D.   On: 05/19/2019 19:43   CT Angio Chest PE W and/or Wo Contrast  Result Date: 05/17/2019 CLINICAL DATA:  Shortness of breath. EXAM: CT ANGIOGRAPHY CHEST WITH CONTRAST TECHNIQUE: Multidetector CT imaging of the chest was performed using the standard protocol during bolus administration of intravenous contrast. Multiplanar CT image reconstructions and MIPs were obtained to evaluate the  vascular anatomy. CONTRAST:  75mL OMNIPAQUE IOHEXOL 350 MG/ML SOLN COMPARISON:  None. FINDINGS: Cardiovascular: Evaluation for pulmonary emboli at the subsegmental level is limited by respiratory motion artifact.There is no pulmonary embolus. The main pulmonary artery is within normal limits for size. There is no CT evidence of acute right heart strain. The visualized aorta is normal. Heart size is normal, without pericardial  effusion. Mediastinum/Nodes: --mild mediastinal adenopathy is noted. --No axillary lymphadenopathy. --No supraclavicular lymphadenopathy. --Normal thyroid gland. --the esophagus is unremarkable. Lungs/Pleura: Extensive left upper lobe airspace consolidation is noted. Emphysema is noted. Bronchiectasis is noted at the lung bases bilaterally with areas of bronchial wall thickening and mucus plugging. There is no pneumothorax. No significant pleural effusion. Upper Abdomen: No acute abnormality. Musculoskeletal: No chest wall abnormality. No acute or significant osseous findings. Review of the MIP images confirms the above findings. IMPRESSION: 1. No acute pulmonary embolism. 2. Extensive left upper lobe consolidation concerning for pneumonia. 3. Again noted are chronic lung changes bilaterally with moderate emphysema and extensive bronchiectasis with mucous plugging and bronchial wall thickening in the left lower lobe. Consider outpatient pulmonary medicine follow-up for further evaluation. 4. Mediastinal adenopathy is again noted and is favored to be reactive. Electronically Signed   By: Katherine Mantle M.D.   On: 05/17/2019 18:13   MR Lumbar Spine W Wo Contrast  Result Date: 05/23/2019 CLINICAL DATA:  Fevers, chills. Evaluate for discitis. EXAM: MRI LUMBAR SPINE WITHOUT AND WITH CONTRAST TECHNIQUE: Multiplanar and multiecho pulse sequences of the lumbar spine were obtained without and with intravenous contrast. CONTRAST:  7.63mL GADAVIST GADOBUTROL 1 MMOL/ML IV SOLN COMPARISON:  None. FINDINGS: Segmentation:  Standard. Alignment:  Physiologic. Vertebrae:  No fracture, evidence of discitis, or bone lesion. Conus medullaris and cauda equina: Conus extends to the L1 level. Conus and cauda equina appear normal. Paraspinal and other soft tissues: Negative. Disc levels: L1-2: No spinal canal or neural foraminal stenosis. L2-3: No spinal canal or neural foraminal stenosis. L3-4: No spinal canal or neural foraminal  stenosis. L4-5: No spinal canal or neural foraminal stenosis. L5-S1: Small central disc protrusion and mild facet degenerative changes. No spinal canal neural foraminal stenosis. IMPRESSION: 1. No evidence of discitis/osteomyelitis. 2. Mild degenerative changes at L5-S1. No spinal canal neural foraminal stenosis. Electronically Signed   By: Baldemar Lenis M.D.   On: 05/23/2019 12:27   DG Chest Portable 1 View  Result Date: 05/17/2019 CLINICAL DATA:  Chest pain and shortness of breath for 1 month. Fever for 1 week. EXAM: PORTABLE CHEST 1 VIEW COMPARISON:  Single-view of the chest 11/10/2018. FINDINGS: The lungs are severely emphysematous. There is airspace disease throughout the left mid and upper lung zone consistent with pneumonia. Streaky opacities in the left base are suggestive of scar and unchanged. No pneumothorax. Trace left pleural effusion. Heart size normal. IMPRESSION: Extensive airspace disease in the left chest consistent with pneumonia. Recommend follow-up to clearing. Emphysema. Electronically Signed   By: Drusilla Kanner M.D.   On: 05/17/2019 14:50   ECHOCARDIOGRAM COMPLETE  Result Date: 05/21/2019    ECHOCARDIOGRAM REPORT   Patient Name:   ZANDEN COLVER Date of Exam: 05/21/2019 Medical Rec #:  [id number]          Height:       73.0 in Accession #:    [id number]         Weight:       170.7 lb Date of Birth:  1970-05-18  BSA:          2.012 m Patient Age:    49 years           BP:           111/72 mmHg Patient Gender: M                  HR:           88 bpm. Exam Location:  Inpatient Procedure: 2D Echo Indications:    Bacteremia 790.7 / R78.81  History:        Patient has prior history of Echocardiogram examinations, most                 recent 06/01/2017. CHF; Risk Factors:Former Smoker and Diabetes.                 HIV, Acute respiratory failure with hypoxia , Pneumonia.  Sonographer:    Jeryl Columbia Referring Phys: [id number] NKEIRUKA J EZENDUKA IMPRESSIONS  1. Left  ventricular ejection fraction, by estimation, is 60 to 65%. The left ventricle has normal function. The left ventricle has no regional wall motion abnormalities. Left ventricular diastolic function could not be evaluated.  2. Right ventricular systolic function is normal. The right ventricular size is normal.  3. The mitral valve is normal in structure and function. No evidence of mitral valve regurgitation. No evidence of mitral stenosis.  4. The aortic valve is normal in structure and function. Aortic valve regurgitation is not visualized. No aortic stenosis is present.  5. The inferior vena cava is normal in size with greater than 50% respiratory variability, suggesting right atrial pressure of 3 mmHg. Conclusion(s)/Recommendation(s): No evidence of valvular vegetations on this transthoracic echocardiogram. Would recommend a transesophageal echocardiogram to exclude infective endocarditis if clinically indicated. FINDINGS  Left Ventricle: Excessive interventricular septum displacement with respiration suggests increased work of breathing. No other findings are present to suggest constrictive physiology. Left ventricular ejection fraction, by estimation, is 60 to 65%. The left ventricle has normal function. The left ventricle has no regional wall motion abnormalities. The left ventricular internal cavity size was normal in size. There is no left ventricular hypertrophy. Left ventricular diastolic function could not be evaluated. Right Ventricle: The right ventricular size is normal. No increase in right ventricular wall thickness. Right ventricular systolic function is normal. Left Atrium: Left atrial size was normal in size. Right Atrium: Right atrial size was normal in size. Pericardium: There is no evidence of pericardial effusion. Mitral Valve: The mitral valve is normal in structure and function. Normal mobility of the mitral valve leaflets. No evidence of mitral valve regurgitation. No evidence of mitral  valve stenosis. Tricuspid Valve: The tricuspid valve is normal in structure. Tricuspid valve regurgitation is not demonstrated. No evidence of tricuspid stenosis. Aortic Valve: The aortic valve is normal in structure and function. Aortic valve regurgitation is not visualized. No aortic stenosis is present. Pulmonic Valve: The pulmonic valve was normal in structure. Pulmonic valve regurgitation is not visualized. No evidence of pulmonic stenosis. Aorta: The aortic root is normal in size and structure. Venous: The inferior vena cava is normal in size with greater than 50% respiratory variability, suggesting right atrial pressure of 3 mmHg. IAS/Shunts: No atrial level shunt detected by color flow Doppler. Thurmon Fair MD Electronically signed by Thurmon Fair MD Signature Date/Time: 05/21/2019/11:57:00 AM    Final    ECHO TEE  Result Date: 05/24/2019    TRANSESOPHOGEAL ECHO REPORT   Patient Name:  [name] P Mizzell Date of Exam: 05/23/2019 Medical Rec #:  [id number]          Height:       73.0 in Accession #:    [id number]         Weight:       164.9 lb Date of Birth:  10/02/1970           BSA:          1.982 m Patient Age:    49 years           BP:           90/78 mmHg Patient Gender: M                  HR:           101 bpm. Exam Location:  Inpatient Procedure: Transesophageal Echo, Cardiac Doppler and Color Doppler Indications:     Bacteremia  History:         Patient has prior history of Echocardiogram examinations, most                  recent 05/21/2019. CHF, COPD, Signs/Symptoms:Dyspnea; Risk                  Factors:Diabetes. HIV. Pneumonia. Respiratory failure.                  Tuberculosis.  Sonographer:     Sheralyn Boatmanina West RDCS Referring Phys:  [id number] Corrin ParkerCALLIE E GOODRICH Diagnosing Phys: Chilton Siiffany [place] MD PROCEDURE: The transesophogeal probe was passed without difficulty through the esophogus of the patient. Imaged were obtained with the patient in a left lateral decubitus position. Sedation performed by  different physician. The patient was monitored while under deep sedation. Anesthestetic sedation was provided intravenously by Anesthesiology: 655mg  of Propofol. The patient's vital signs; including heart rate, blood pressure, and oxygen saturation; remained stable throughout the procedure. The patient developed no complications during the procedure. IMPRESSIONS  1. Left ventricular ejection fraction, by estimation, is 55 to 60%. The left ventricle has normal function. The left ventricle has no regional wall motion abnormalities.  2. Right ventricular systolic function is normal. The right ventricular size is normal.  3. No left atrial/left atrial appendage thrombus was detected. The LAA emptying velocity was 84 cm/s.  4. The mitral valve is normal in structure. Mild mitral valve regurgitation. No evidence of mitral stenosis.  5. The aortic valve is tricuspid. Aortic valve regurgitation is not visualized. No aortic stenosis is present.  6. The inferior vena cava is normal in size with greater than 50% respiratory variability, suggesting right atrial pressure of 3 mmHg. Conclusion(s)/Recommendation(s): Normal biventricular function without evidence of hemodynamically significant valvular heart disease. No evidence of vegetation/infective endocarditis on this transesophageal echocardiogram. FINDINGS  Left Ventricle: Left ventricular ejection fraction, by estimation, is 55 to 60%. The left ventricle has normal function. The left ventricle has no regional wall motion abnormalities. The left ventricular internal cavity size was normal in size. There is  no left ventricular hypertrophy. Right Ventricle: The right ventricular size is normal. No increase in right ventricular wall thickness. Right ventricular systolic function is normal. Left Atrium: Left atrial size was normal in size. No left atrial/left atrial appendage thrombus was detected. The LAA emptying velocity was 84 cm/s. Right Atrium: Right atrial size was  normal in size. Pericardium: There is no evidence of pericardial effusion. Mitral Valve: Multiple mitral regurgitation jets. The mitral valve is normal in structure. Normal  mobility of the mitral valve leaflets. Mild mitral valve regurgitation. No evidence of mitral valve stenosis. Tricuspid Valve: The tricuspid valve is normal in structure. Tricuspid valve regurgitation is trivial. No evidence of tricuspid stenosis. Aortic Valve: The aortic valve is tricuspid. Aortic valve regurgitation is not visualized. No aortic stenosis is present. Pulmonic Valve: The pulmonic valve was normal in structure. Pulmonic valve regurgitation is not visualized. No evidence of pulmonic stenosis. Aorta: The aortic root is normal in size and structure. Venous: The inferior vena cava is normal in size with greater than 50% respiratory variability, suggesting right atrial pressure of 3 mmHg. IAS/Shunts: No atrial level shunt detected by color flow Doppler. Chilton Si MD Electronically signed by Chilton Si MD Signature Date/Time: 05/24/2019/10:56:07 AM    Final        Subjective: Patient seen and examined at bedside.  He denies worsening shortness of breath or cough.  Feels better and feels okay to go home.  No overnight fever or vomiting.  Discharge Exam: Vitals:   05/25/19 2047 05/26/19 0438  BP: 113/68 99/64  Pulse: 93 87  Resp: 16 16  Temp: 98.5 F (36.9 C) 98.2 F (36.8 C)  SpO2: 93% 94%    General: Pt is alert, awake, not in acute distress.  Poor historian.  Chronically ill looking and thinly built male lying in bed. Cardiovascular: rate controlled, S1/S2 + Respiratory: bilateral decreased breath sounds at bases with some scattered crackles Abdominal: Soft, NT, ND, bowel sounds + Extremities: no edema, no cyanosis    The results of significant diagnostics from this hospitalization (including imaging, microbiology, ancillary and laboratory) are listed below for reference.      Microbiology: Recent Results (from the past 240 hour(s))  Culture, blood (Routine x 2)     Status: None   Collection Time: 05/17/19 12:23 PM   Specimen: BLOOD  Result Value Ref Range Status   Specimen Description BLOOD RIGHT ANTECUBITAL  Final   Special Requests   Final    BOTTLES DRAWN AEROBIC AND ANAEROBIC Blood Culture adequate volume   Culture   Final    NO GROWTH 5 DAYS Performed at [facility], [address] [place], [place] [address]    Report Status 05/22/2019 FINAL  Final  Culture, blood (Routine x 2)     Status: Abnormal   Collection Time: 05/17/19  3:45 PM   Specimen: BLOOD RIGHT ARM  Result Value Ref Range Status   Specimen Description BLOOD RIGHT ARM  Final   Special Requests   Final    BOTTLES DRAWN AEROBIC AND ANAEROBIC Blood Culture results may not be optimal due to an inadequate volume of blood received in culture bottles   Culture  Setup Time   Final    GRAM POSITIVE COCCI IN CLUSTERS ANAEROBIC BOTTLE ONLY CRITICAL RESULT CALLED TO, READ BACK BY AND VERIFIED WITH: J. STEENWYK, PHARMD AT 0830 ON 05/18/19 BY C. JESSUP, MT. Performed at [facility], [address] [place], [place] [address]    Culture STAPHYLOCOCCUS AUREUS (A)  Final   Report Status 05/20/2019 FINAL  Final   Organism ID, Bacteria STAPHYLOCOCCUS AUREUS  Final      Susceptibility   Staphylococcus aureus - MIC*    CIPROFLOXACIN >=8 RESISTANT Resistant     ERYTHROMYCIN >=8 RESISTANT Resistant     GENTAMICIN <=0.5 SENSITIVE Sensitive     OXACILLIN <=0.25 SENSITIVE Sensitive     TETRACYCLINE <=1 SENSITIVE Sensitive     VANCOMYCIN <=0.5 SENSITIVE Sensitive  TRIMETH/SULFA >=320 RESISTANT Resistant     CLINDAMYCIN RESISTANT Resistant     RIFAMPIN <=0.5 SENSITIVE Sensitive     Inducible Clindamycin POSITIVE Resistant     * STAPHYLOCOCCUS AUREUS  SARS CORONAVIRUS 2 (TAT 6-24 HRS) Nasopharyngeal Nasopharyngeal Swab     Status: None   Collection Time: 05/17/19  7:48 PM    Specimen: Nasopharyngeal Swab  Result Value Ref Range Status   SARS Coronavirus 2 NEGATIVE NEGATIVE Final    Comment: (NOTE) SARS-CoV-2 target nucleic acids are NOT DETECTED. The SARS-CoV-2 RNA is generally detectable in upper and lower respiratory specimens during the acute phase of infection. Negative results do not preclude SARS-CoV-2 infection, do not rule out co-infections with other pathogens, and should not be used as the sole basis for treatment or other patient management decisions. Negative results must be combined with clinical observations, patient history, and epidemiological information. The expected result is Negative. Fact Sheet for Patients: HairSlick.no Fact Sheet for Healthcare Providers: quierodirigir.com This test is not yet approved or cleared by the [place] FDA and  has been authorized for detection and/or diagnosis of SARS-CoV-2 by FDA under an Emergency Use Authorization (EUA). This EUA will remain  in effect (meaning this test can be used) for the duration of the COVID-19 declaration under Section 56 4(b)(1) of the Act, 21 U.S.C. section 360bbb-3(b)(1), unless the authorization is terminated or revoked sooner. Performed at [facility], [address], [place], [place] [address]   Expectorated sputum assessment w rflx to resp cult     Status: None   Collection Time: 05/17/19  7:57 PM   Specimen: Expectorated Sputum  Result Value Ref Range Status   Specimen Description EXPECTORATED SPUTUM  Final   Special Requests NONE  Final   Sputum evaluation   Final    Sputum specimen not acceptable for testing.  Please recollect.   RESULT CALLED TO, READ BACK BY AND VERIFIED WITH: Jerelyn Scott RN 05/17/19 2112 JDW Performed at [facility], [address], [place], [place] [address]    Report Status 05/17/2019 FINAL  Final  Pneumocystis smear by DFA     Status: None   Collection Time: 05/17/19  7:57  PM   Specimen: Sputum; Respiratory  Result Value Ref Range Status   Specimen Source-PJSRC EXPECTORATED SPUTUM  Final   Pneumocystis jiroveci Ag NEGATIVE  Final    Comment: Performed at [facility] Performed at [facility], [address] [place], [place] [address]   Culture, blood (routine x 2)     Status: None   Collection Time: 05/19/19  4:39 PM   Specimen: BLOOD RIGHT HAND  Result Value Ref Range Status   Specimen Description BLOOD RIGHT HAND  Final   Special Requests   Final    BOTTLES DRAWN AEROBIC ONLY Blood Culture adequate volume   Culture   Final    NO GROWTH 5 DAYS Performed at [facility], [address], [place], [place] [address]    Report Status 05/24/2019 FINAL  Final  Culture, blood (routine x 2)     Status: None   Collection Time: 05/19/19  4:43 PM   Specimen: BLOOD LEFT HAND  Result Value Ref Range Status   Specimen Description BLOOD LEFT HAND  Final   Special Requests   Final    BOTTLES DRAWN AEROBIC ONLY Blood Culture adequate volume   Culture   Final    NO GROWTH 5 DAYS Performed at [facility]  Lab, [address] [place], [place] [address]    Report Status 05/24/2019 FINAL  Final     Labs: BNP (last 3 results) No results for input(s): BNP in the last 8760 hours. Basic Metabolic Panel: Recent Labs  Lab 05/22/19 0600 05/23/19 0513 05/24/19 0443 05/25/19 0431 05/26/19 0713  NA 134* 134* 134* 135 134*  K 4.4 4.1 3.8 4.4 3.4*  CL 102 100 101 101 102  CO2 GLUCOSE 150* 133* 150* 118* 207*  BUN 31* 34* 28* 19 18  CREATININE 1.36* 1.39* 1.22 1.47* 1.31*  CALCIUM 9.2 9.0 8.8* 8.8* 8.6*  MG  --   --   --  1.9 1.9   Liver Function Tests: No results for input(s): AST, ALT, ALKPHOS, BILITOT, PROT, ALBUMIN in the last 168 hours. No results for input(s): LIPASE, AMYLASE in the last 168 hours. No results for input(s): AMMONIA in the last 168 hours. CBC: Recent Labs  Lab 05/22/19 0436 05/23/19 0513  05/24/19 0443 05/25/19 0431 05/26/19 0713  WBC 11.5* 7.4 7.4 7.3 5.5  NEUTROABS 6.7 3.5 3.7 3.7 PENDING  HGB 10.2* 10.0* 9.8* 10.0* 9.7*  HCT 33.3* 33.7* 33.4* 33.7* 32.4*  MCV 81.8 84.3 83.7 84.3 84.2  PLT 406* 404* 349 321 277   Cardiac Enzymes: No results for input(s): CKTOTAL, CKMB, CKMBINDEX, TROPONINI in the last 168 hours. BNP: Invalid input(s): POCBNP CBG: Recent Labs  Lab 05/24/19 1633 05/24/19 2106 05/25/19 0720 05/25/19 1127 05/26/19 0659  GLUCAP 170* 172* 128* 90 204*   D-Dimer No results for input(s): DDIMER in the last 72 hours. Hgb A1c No results for input(s): HGBA1C in the last 72 hours. Lipid Profile No results for input(s): CHOL, HDL, LDLCALC, TRIG, CHOLHDL, LDLDIRECT in the last 72 hours. Thyroid function studies No results for input(s): TSH, T4TOTAL, T3FREE, THYROIDAB in the last 72 hours.  Invalid input(s): FREET3 Anemia work up No results for input(s): VITAMINB12, FOLATE, FERRITIN, TIBC, IRON, RETICCTPCT in the last 72 hours. Urinalysis    Component Value Date/Time   COLORURINE YELLOW 05/17/2019 1556   APPEARANCEUR CLEAR 05/17/2019 1556   LABSPEC 1.020 05/17/2019 1556   PHURINE 6.0 05/17/2019 1556   GLUCOSEU NEGATIVE 05/17/2019 1556   HGBUR NEGATIVE 05/17/2019 1556   BILIRUBINUR NEGATIVE 05/17/2019 1556   KETONESUR NEGATIVE 05/17/2019 1556   PROTEINUR 30 (A) 05/17/2019 1556   UROBILINOGEN 1.0 03/23/2014 1400   NITRITE NEGATIVE 05/17/2019 1556   LEUKOCYTESUR NEGATIVE 05/17/2019 1556   Sepsis Labs Invalid input(s): PROCALCITONIN,  WBC,  LACTICIDVEN Microbiology Recent Results (from the past 240 hour(s))  Culture, blood (Routine x 2)     Status: None   Collection Time: 05/17/19 12:23 PM   Specimen: BLOOD  Result Value Ref Range Status   Specimen Description BLOOD RIGHT ANTECUBITAL  Final   Special Requests   Final    BOTTLES DRAWN AEROBIC AND ANAEROBIC Blood Culture adequate volume   Culture   Final    NO GROWTH 5 DAYS Performed at  [facility] Lab, [address], [place], [place] [address]    Report Status 05/22/2019 FINAL  Final  Culture, blood (Routine x 2)     Status: Abnormal   Collection Time: 05/17/19  3:45 PM   Specimen: BLOOD RIGHT ARM  Result Value Ref Range Status   Specimen Description BLOOD RIGHT ARM  Final   Special Requests   Final    BOTTLES DRAWN AEROBIC AND ANAEROBIC Blood Culture results may not be optimal due  to an inadequate volume of blood received in culture bottles   Culture  Setup Time   Final    GRAM POSITIVE COCCI IN CLUSTERS ANAEROBIC BOTTLE ONLY CRITICAL RESULT CALLED TO, READ BACK BY AND VERIFIED WITH: J. STEENWYK, PHARMD AT 0830 ON 05/18/19 BY C. JESSUP, MT. Performed at [facility] Lab, [address] [place], [place] [address]    Culture STAPHYLOCOCCUS AUREUS (A)  Final   Report Status 05/20/2019 FINAL  Final   Organism ID, Bacteria STAPHYLOCOCCUS AUREUS  Final      Susceptibility   Staphylococcus aureus - MIC*    CIPROFLOXACIN >=8 RESISTANT Resistant     ERYTHROMYCIN >=8 RESISTANT Resistant     GENTAMICIN <=0.5 SENSITIVE Sensitive     OXACILLIN <=0.25 SENSITIVE Sensitive     TETRACYCLINE <=1 SENSITIVE Sensitive     VANCOMYCIN <=0.5 SENSITIVE Sensitive     TRIMETH/SULFA >=320 RESISTANT Resistant     CLINDAMYCIN RESISTANT Resistant     RIFAMPIN <=0.5 SENSITIVE Sensitive     Inducible Clindamycin POSITIVE Resistant     * STAPHYLOCOCCUS AUREUS  SARS CORONAVIRUS 2 (TAT 6-24 HRS) Nasopharyngeal Nasopharyngeal Swab     Status: None   Collection Time: 05/17/19  7:48 PM   Specimen: Nasopharyngeal Swab  Result Value Ref Range Status   SARS Coronavirus 2 NEGATIVE NEGATIVE Final    Comment: (NOTE) SARS-CoV-2 target nucleic acids are NOT DETECTED. The SARS-CoV-2 RNA is generally detectable in upper and lower respiratory specimens during the acute phase of infection. Negative results do not preclude SARS-CoV-2 infection, do not rule out co-infections with other pathogens, and  should not be used as the sole basis for treatment or other patient management decisions. Negative results must be combined with clinical observations, patient history, and epidemiological information. The expected result is Negative. Fact Sheet for Patients: HairSlick.no Fact Sheet for Healthcare Providers: quierodirigir.com This test is not yet approved or cleared by the [place] FDA and  has been authorized for detection and/or diagnosis of SARS-CoV-2 by FDA under an Emergency Use Authorization (EUA). This EUA will remain  in effect (meaning this test can be used) for the duration of the COVID-19 declaration under Section 56 4(b)(1) of the Act, 21 U.S.C. section 360bbb-3(b)(1), unless the authorization is terminated or revoked sooner. Performed at [facility] Lab, [address] [place], [place] [address]   Expectorated sputum assessment w rflx to resp cult     Status: None   Collection Time: 05/17/19  7:57 PM   Specimen: Expectorated Sputum  Result Value Ref Range Status   Specimen Description EXPECTORATED SPUTUM  Final   Special Requests NONE  Final   Sputum evaluation   Final    Sputum specimen not acceptable for testing.  Please recollect.   RESULT CALLED TO, READ BACK BY AND VERIFIED WITH: Jerelyn Scott RN 05/17/19 2112 JDW Performed at [facility] Lab, [address] [place], [place] [address]    Report Status 05/17/2019 FINAL  Final  Pneumocystis smear by DFA     Status: None   Collection Time: 05/17/19  7:57 PM   Specimen: Sputum; Respiratory  Result Value Ref Range Status   Specimen Source-PJSRC EXPECTORATED SPUTUM  Final   Pneumocystis jiroveci Ag NEGATIVE  Final    Comment: Performed at [facility] Performed at [facility] Lab, [address] [place], [place] [address]   Culture, blood (routine x 2)     Status: None   Collection Time: 05/19/19  4:39  PM   Specimen: BLOOD RIGHT HAND  Result  Value Ref Range Status   Specimen Description BLOOD RIGHT HAND  Final   Special Requests   Final    BOTTLES DRAWN AEROBIC ONLY Blood Culture adequate volume   Culture   Final    NO GROWTH 5 DAYS Performed at [facility] Lab, [address] [place], [place] [address]    Report Status 05/24/2019 FINAL  Final  Culture, blood (routine x 2)     Status: None   Collection Time: 05/19/19  4:43 PM   Specimen: BLOOD LEFT HAND  Result Value Ref Range Status   Specimen Description BLOOD LEFT HAND  Final   Special Requests   Final    BOTTLES DRAWN AEROBIC ONLY Blood Culture adequate volume   Culture   Final    NO GROWTH 5 DAYS Performed at [facility] Lab, [address], [place], [place] [address]    Report Status 05/24/2019 FINAL  Final     Time coordinating discharge: 35 minutes  SIGNED:   Glade Lloyd, MD  [facility] 05/26/2019, 9:47 AM

## 2019-06-07 ENCOUNTER — Ambulatory Visit: Payer: Self-pay | Admitting: Internal Medicine

## 2019-06-09 ENCOUNTER — Inpatient Hospital Stay: Payer: Self-pay | Admitting: Internal Medicine

## 2019-06-28 ENCOUNTER — Ambulatory Visit (INDEPENDENT_AMBULATORY_CARE_PROVIDER_SITE_OTHER): Payer: Self-pay | Admitting: Internal Medicine

## 2019-06-28 ENCOUNTER — Encounter: Payer: Self-pay | Admitting: Internal Medicine

## 2019-06-28 ENCOUNTER — Other Ambulatory Visit: Payer: Self-pay

## 2019-06-28 VITALS — BP 118/69 | HR 93 | Temp 97.9°F | Wt 161.0 lb

## 2019-06-28 DIAGNOSIS — Z939 Artificial opening status, unspecified: Secondary | ICD-10-CM

## 2019-06-28 DIAGNOSIS — B20 Human immunodeficiency virus [HIV] disease: Secondary | ICD-10-CM

## 2019-06-28 DIAGNOSIS — J181 Lobar pneumonia, unspecified organism: Secondary | ICD-10-CM

## 2019-06-28 DIAGNOSIS — A630 Anogenital (venereal) warts: Secondary | ICD-10-CM

## 2019-06-28 MED ORDER — TIVICAY 50 MG PO TABS: 50.0000 mg | ORAL_TABLET | Freq: Every day | ORAL | 11 refills | Status: DC

## 2019-06-28 MED ORDER — SYMTUZA 800-150-200-10 MG PO TABS: 1.0000 | ORAL_TABLET | Freq: Every day | ORAL | 11 refills | Status: DC

## 2019-06-28 NOTE — Progress Notes (Signed)
RFV: follow up for hiv disease  Patient ID: [name], male   DOB: 1970/08/08, 49 y.o.   MRN: [id number]  HPI He was hospitalized for covid pneumonia complicated by MSSA pneumonia/bacteremia. At that time, he disclosed that he had been off of his hiv regimen. Also on bactrim for oi proph -which is finished yesterday. He reports that he is doing better.  Outpatient Encounter Medications as of 06/28/2019  Medication Sig  . Darunavir-Cobicisctat-Emtricitabine-Tenofovir Alafenamide (SYMTUZA) 800-150-200-10 MG TABS Take 1 tablet by mouth daily with breakfast.  . dolutegravir (TIVICAY) 50 MG tablet Take 1 tablet (50 mg total) by mouth daily.  . ferrous sulfate 325 (65 FE) MG tablet Take 1 tablet (325 mg total) by mouth daily with breakfast.  . guaiFENesin (MUCINEX) 600 MG 12 hr tablet Take 1 tablet (600 mg total) by mouth 2 (two) times daily.  Marland Kitchen sulfamethoxazole-trimethoprim (BACTRIM DS) 800-160 MG tablet Take 1 tablet by mouth daily.   No facility-administered encounter medications on file as of 06/28/2019.     Patient Active Problem List   Diagnosis Date Noted  . MSSA bacteremia   . Acute respiratory failure with hypoxia ([place]) 05/17/2019  . Type 2 diabetes mellitus ([place]) 05/17/2019  . DKA (diabetic ketoacidoses) ([place]) 11/10/2018  . Pulmonary emphysema ([place])   . 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 Chronic systolic CHF (congestive heart failure) ([place]) 04/24/2016  . AKI (acute kidney injury) ([place]) 04/24/2016  . 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 Normochromic normocytic anemia 02/25/2016  . Protein-calorie malnutrition, severe 02/25/2016  . Renal insufficiency   . S/P ORIF (open reduction internal fixation) fracture 03/21/2014  . Multifocal pneumonia 09/27/2013  . HIV (human immunodeficiency virus infection) ([place]) 09/18/2013  . Refugee health examination 09/18/2013     Health Maintenance Due    Topic Date Due  . FOOT EXAM  Never done  . OPHTHALMOLOGY EXAM  Never done  . URINE MICROALBUMIN  Never done  . TETANUS/TDAP  Never done     Review of Systems 12 point ros is negative Physical Exam   Wt 161 lb (73 kg)   BMI 21.24 kg/m   Physical Exam  Constitutional: He is oriented to person, place, and time. He appears well-developed and well-nourished. No distress.  HENT:  Mouth/Throat: Oropharynx is clear and moist. No oropharyngeal exudate.  Cardiovascular: Normal rate, regular rhythm and normal heart sounds. Exam reveals no gallop and no friction rub.  No murmur heard.  Pulmonary/Chest: Effort normal and breath sounds normal. No respiratory distress. He has no wheezes.  Abdominal: Soft. Bowel sounds are normal. He exhibits no distension. ostomy Lymphadenopathy:  He has no cervical adenopathy.  Neurological: He is alert and oriented to person, place, and time.  Skin: Skin is warm and dry. No rash noted. No erythema.  Psychiatric: He has a normal mood and affect. His behavior is normal.    Lab Results  Component Value Date   CD4TCELL 6 (L) 05/18/2019   Lab Results  Component Value Date   CD4TABS 64 (L) 05/18/2019   CD4TABS 172 (L) 08/22/2018   CD4TABS 100 (L) 05/18/2018   Lab Results  Component Value Date   HIV1RNAQUANT 840 05/18/2019   No results found for: HEPBSAB Lab Results  Component Value Date   LABRPR NON-REACTIVE 08/22/2018    CBC Lab Results  Component Value Date   WBC 5.5 05/26/2019   RBC 3.85 (L) 05/26/2019  HGB 9.7 (L) 05/26/2019   HCT 32.4 (L) 05/26/2019   PLT 277 05/26/2019   MCV 84.2 05/26/2019   MCH 25.2 (L) 05/26/2019   MCHC 29.9 (L) 05/26/2019   RDW 25.3 (H) 05/26/2019   LYMPHSABS 2.1 05/26/2019   MONOABS 0.7 05/26/2019   EOSABS 0.4 05/26/2019    BMET Lab Results  Component Value Date   NA 134 (L) 05/26/2019   K 3.4 (L) 05/26/2019   CL 102 05/26/2019   CO2 25 05/26/2019   GLUCOSE 207 (H) 05/26/2019   BUN 18 05/26/2019    CREATININE 1.31 (H) 05/26/2019   CALCIUM 8.6 (L) 05/26/2019   GFRNONAA >60 05/26/2019   GFRAA >60 05/26/2019    Assessment and Plan  hiv disease = will refil hiv regimen, and will check labs  adap needed   mssa pneumonia = recovered  Diverting ostomy = needs more supplies?  Health maintenance = recommend covid vaccine  Citizenship application = [employer], want a letter? To take it to lawyer

## 2019-06-28 NOTE — Patient Instructions (Signed)
COVID-19 Vaccine Information can be found at: [email or web address] For questions related to vaccine distribution or appointments, please email [email or web address] or call [phone].    

## 2019-06-29 LAB — T-HELPER CELL (CD4) - (RCID CLINIC ONLY)
CD4 % Helper T Cell: 8 % — ABNORMAL LOW (ref 33–65)
CD4 T Cell Abs: 159 /uL — ABNORMAL LOW (ref 400–1790)

## 2019-07-02 LAB — CBC WITH DIFFERENTIAL/PLATELET
Absolute Monocytes: 420 cells/uL (ref 200–950)
Basophils Absolute: 32 cells/uL (ref 0–200)
Basophils Relative: 0.8 %
Eosinophils Absolute: 232 cells/uL (ref 15–500)
Eosinophils Relative: 5.8 %
HCT: 37.1 % — ABNORMAL LOW (ref 38.5–50.0)
Hemoglobin: 11.7 g/dL — ABNORMAL LOW (ref 13.2–17.1)
Lymphs Abs: 2256 cells/uL (ref 850–3900)
MCH: 29.1 pg (ref 27.0–33.0)
MCHC: 31.5 g/dL — ABNORMAL LOW (ref 32.0–36.0)
MCV: 92.3 fL (ref 80.0–100.0)
MPV: 10.1 fL (ref 7.5–12.5)
Monocytes Relative: 10.5 %
Neutro Abs: 1060 cells/uL — ABNORMAL LOW (ref 1500–7800)
Neutrophils Relative %: 26.5 %
Platelets: 228 10*3/uL (ref 140–400)
RBC: 4.02 10*6/uL — ABNORMAL LOW (ref 4.20–5.80)
RDW: 20.8 % — ABNORMAL HIGH (ref 11.0–15.0)
Total Lymphocyte: 56.4 %
WBC: 4 10*3/uL (ref 3.8–10.8)

## 2019-07-02 LAB — COMPLETE METABOLIC PANEL WITH GFR
AG Ratio: 0.9 (calc) — ABNORMAL LOW (ref 1.0–2.5)
ALT: 10 U/L (ref 9–46)
AST: 21 U/L (ref 10–40)
Albumin: 3.8 g/dL (ref 3.6–5.1)
Alkaline phosphatase (APISO): 77 U/L (ref 36–130)
BUN: 21 mg/dL (ref 7–25)
CO2: 31 mmol/L (ref 20–32)
Calcium: 9 mg/dL (ref 8.6–10.3)
Chloride: 103 mmol/L (ref 98–110)
Creat: 1.28 mg/dL (ref 0.60–1.35)
GFR, Est African American: 76 mL/min/{1.73_m2} (ref >=60)
GFR, Est Non African American: 66 mL/min/{1.73_m2} (ref >=60)
Globulin: 4.4 g/dL (calc) — ABNORMAL HIGH (ref 1.9–3.7)
Glucose, Bld: 123 mg/dL — ABNORMAL HIGH (ref 65–99)
Potassium: 4.3 mmol/L (ref 3.5–5.3)
Sodium: 137 mmol/L (ref 135–146)
Total Bilirubin: 0.3 mg/dL (ref 0.2–1.2)
Total Protein: 8.2 g/dL — ABNORMAL HIGH (ref 6.1–8.1)

## 2019-07-02 LAB — HIV-1 RNA QUANT-NO REFLEX-BLD
HIV 1 RNA Quant: 923 copies/mL — ABNORMAL HIGH
HIV-1 RNA Quant, Log: 2.97 Log copies/mL — ABNORMAL HIGH

## 2019-07-11 ENCOUNTER — Telehealth: Payer: Self-pay

## 2019-07-11 NOTE — Telephone Encounter (Signed)
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 He reports to be struggling with house hold bill and medical bills.He says that he needs assistance with gaining [place] citizenship. He has been a green card holder for about 6 years. I will consult with case manager for assistance. Nicole Cella Ved Martos RN [facility] [phone]-cell

## 2019-08-22 ENCOUNTER — Other Ambulatory Visit: Payer: Self-pay

## 2019-08-22 DIAGNOSIS — Z79899 Other long term (current) drug therapy: Secondary | ICD-10-CM

## 2019-08-22 DIAGNOSIS — B20 Human immunodeficiency virus [HIV] disease: Secondary | ICD-10-CM

## 2019-08-22 DIAGNOSIS — Z113 Encounter for screening for infections with a predominantly sexual mode of transmission: Secondary | ICD-10-CM

## 2019-08-23 LAB — COMPREHENSIVE METABOLIC PANEL
AG Ratio: 0.7 (calc) — ABNORMAL LOW (ref 1.0–2.5)
ALT: 12 U/L (ref 9–46)
AST: 28 U/L (ref 10–40)
Albumin: 3.7 g/dL (ref 3.6–5.1)
Alkaline phosphatase (APISO): 61 U/L (ref 36–130)
BUN: 19 mg/dL (ref 7–25)
CO2: 27 mmol/L (ref 20–32)
Calcium: 9 mg/dL (ref 8.6–10.3)
Chloride: 99 mmol/L (ref 98–110)
Creat: 1.21 mg/dL (ref 0.60–1.35)
Globulin: 5 g/dL (calc) — ABNORMAL HIGH (ref 1.9–3.7)
Glucose, Bld: 100 mg/dL — ABNORMAL HIGH (ref 65–99)
Potassium: 3.5 mmol/L (ref 3.5–5.3)
Sodium: 137 mmol/L (ref 135–146)
Total Bilirubin: 1 mg/dL (ref 0.2–1.2)
Total Protein: 8.7 g/dL — ABNORMAL HIGH (ref 6.1–8.1)

## 2019-08-23 LAB — CBC WITH DIFFERENTIAL/PLATELET
Absolute Monocytes: 548 cells/uL (ref 200–950)
Basophils Absolute: 28 cells/uL (ref 0–200)
Basophils Relative: 0.7 %
Eosinophils Absolute: 180 cells/uL (ref 15–500)
Eosinophils Relative: 4.5 %
HCT: 42.8 % (ref 38.5–50.0)
Hemoglobin: 13.8 g/dL (ref 13.2–17.1)
Lymphs Abs: 1516 cells/uL (ref 850–3900)
MCH: 30.3 pg (ref 27.0–33.0)
MCHC: 32.2 g/dL (ref 32.0–36.0)
MCV: 93.9 fL (ref 80.0–100.0)
MPV: 10.2 fL (ref 7.5–12.5)
Monocytes Relative: 13.7 %
Neutro Abs: 1728 cells/uL (ref 1500–7800)
Neutrophils Relative %: 43.2 %
Platelets: 181 10*3/uL (ref 140–400)
RBC: 4.56 10*6/uL (ref 4.20–5.80)
RDW: 12.2 % (ref 11.0–15.0)
Total Lymphocyte: 37.9 %
WBC: 4 10*3/uL (ref 3.8–10.8)

## 2019-08-23 LAB — LIPID PANEL
Cholesterol: 155 mg/dL (ref <200)
HDL: 65 mg/dL (ref >=40)
LDL Cholesterol (Calc): 78 mg/dL (calc)
Non-HDL Cholesterol (Calc): 90 mg/dL (calc) (ref <130)
Total CHOL/HDL Ratio: 2.4 (calc) (ref <5.0)
Triglycerides: 50 mg/dL (ref <150)

## 2019-08-23 LAB — T-HELPER CELL (CD4) - (RCID CLINIC ONLY)
CD4 % Helper T Cell: 7 % — ABNORMAL LOW (ref 33–65)
CD4 T Cell Abs: 100 /uL — ABNORMAL LOW (ref 400–1790)

## 2019-08-23 LAB — RPR TITER: RPR Titer: 1:1 {titer} — ABNORMAL HIGH

## 2019-08-23 LAB — HIV-1 RNA QUANT-NO REFLEX-BLD
HIV 1 RNA Quant: 53 copies/mL — ABNORMAL HIGH
HIV-1 RNA Quant, Log: 1.72 Log copies/mL — ABNORMAL HIGH

## 2019-08-23 LAB — RPR: RPR Ser Ql: REACTIVE — AB

## 2019-08-23 LAB — FLUORESCENT TREPONEMAL AB(FTA)-IGG-BLD: Fluorescent Treponemal ABS: REACTIVE — AB

## 2019-09-06 ENCOUNTER — Other Ambulatory Visit: Payer: Self-pay

## 2019-09-06 ENCOUNTER — Ambulatory Visit (INDEPENDENT_AMBULATORY_CARE_PROVIDER_SITE_OTHER): Payer: Self-pay | Admitting: Internal Medicine

## 2019-09-06 ENCOUNTER — Encounter: Payer: Self-pay | Admitting: Internal Medicine

## 2019-09-06 VITALS — BP 134/79 | HR 81 | Temp 98.2°F | Wt 174.0 lb

## 2019-09-06 DIAGNOSIS — A530 Latent syphilis, unspecified as early or late: Secondary | ICD-10-CM

## 2019-09-06 DIAGNOSIS — L308 Other specified dermatitis: Secondary | ICD-10-CM

## 2019-09-06 DIAGNOSIS — B20 Human immunodeficiency virus [HIV] disease: Secondary | ICD-10-CM

## 2019-09-06 DIAGNOSIS — E1100 Type 2 diabetes mellitus with hyperosmolarity without nonketotic hyperglycemic-hyperosmolar coma (NKHHC): Secondary | ICD-10-CM

## 2019-09-06 DIAGNOSIS — Z794 Long term (current) use of insulin: Secondary | ICD-10-CM

## 2019-09-06 MED ORDER — SITAGLIPTIN PHOS-METFORMIN HCL 50-500 MG PO TABS: 1.0000 | ORAL_TABLET | Freq: Two times a day (BID) | ORAL | 11 refills | Status: DC

## 2019-09-06 MED ORDER — PENICILLIN G BENZATHINE 1200000 UNIT/2ML IM SUSP
1.2000 10*6.[IU] | Freq: Once | INTRAMUSCULAR | Status: AC
  Administered 2019-09-06: 1.2 10*6.[IU] via INTRAMUSCULAR

## 2019-09-06 NOTE — Progress Notes (Signed)
RFV; follow up for hiv disease   Patient ID: [name], male   DOB: [birth year], 48 y.o.   MRN: [id number]  HPI 49yo M with hiv disease, with cd 4 count of 100/VL 53 ( June 2021) on tivicay-symtuza, also on oi proph of bactrim ds daily. Needs refill for janumet.   Needs more supplies for ostomy Also has lost appt time for going back to [facility] from general surgery to see dr waters  Outpatient Encounter Medications as of 09/06/2019  Medication Sig  . Darunavir-Cobicisctat-Emtricitabine-Tenofovir Alafenamide (SYMTUZA) 800-150-200-10 MG TABS Take 1 tablet by mouth daily with breakfast.  . dolutegravir (TIVICAY) 50 MG tablet Take 1 tablet (50 mg total) by mouth daily.  . ferrous sulfate 325 (65 FE) MG tablet Take 1 tablet (325 mg total) by mouth daily with breakfast.  . guaiFENesin (MUCINEX) 600 MG 12 hr tablet Take 1 tablet (600 mg total) by mouth 2 (two) times daily.  Marland Kitchen sulfamethoxazole-trimethoprim (BACTRIM DS) 800-160 MG tablet Take 1 tablet by mouth daily. (Patient not taking: Reported on 09/06/2019)   No facility-administered encounter medications on file as of 09/06/2019.     Patient Active Problem List   Diagnosis Date Noted  . MSSA bacteremia   . Acute respiratory failure with hypoxia ([name]) 05/17/2019  . Type 2 diabetes mellitus ([name]) 05/17/2019  . DKA (diabetic ketoacidoses) ([name]) 11/10/2018  . Pulmonary emphysema ([name])   . 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 Chronic systolic CHF (congestive heart failure) ([name]) 04/24/2016  . AKI (acute kidney injury) ([name]) 04/24/2016  . 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 Normochromic normocytic anemia 02/25/2016  . Protein-calorie malnutrition, severe 02/25/2016  . Renal insufficiency   . S/P ORIF (open reduction internal fixation) fracture 03/21/2014  . Multifocal pneumonia 09/27/2013  . HIV (human immunodeficiency virus infection) ([name]) 09/18/2013    . Refugee health examination 09/18/2013     Health Maintenance Due  Topic Date Due  . FOOT EXAM  Never done  . OPHTHALMOLOGY EXAM  Never done  . URINE MICROALBUMIN  Never done  . COVID-19 Vaccine (1) Never done  . TETANUS/TDAP  Never done     Review of Systems Review of Systems  Constitutional: Negative for fever, chills, diaphoresis, activity change, appetite change, fatigue and unexpected weight change.  HENT: Negative for congestion, sore throat, rhinorrhea, sneezing, trouble swallowing and sinus pressure.  Eyes: Negative for photophobia and visual disturbance.  Respiratory: Negative for cough, chest tightness, shortness of breath, wheezing and stridor.  Cardiovascular: Negative for chest pain, palpitations and leg swelling.  Gastrointestinal: Negative for nausea, vomiting, abdominal pain, diarrhea, constipation, blood in stool, abdominal distention and anal bleeding.  Genitourinary: Negative for dysuria, hematuria, flank pain and difficulty urinating.  Musculoskeletal: Negative for myalgias, back pain, joint swelling, arthralgias and gait problem.  Skin: Negative for color change, pallor, rash and wound.  Neurological: Negative for dizziness, tremors, weakness and light-headedness.  Hematological: Negative for adenopathy. Does not bruise/bleed easily.  Psychiatric/Behavioral: Negative for behavioral problems, confusion, sleep disturbance, dysphoric mood, decreased concentration and agitation.    Physical Exam   BP 134/79   Pulse 81   Temp 98.2 F (36.8 C) (Oral)   Wt 174 lb (78.9 kg)   BMI 22.96 kg/m   Physical Exam  Constitutional: He is oriented to person, place, and time. He appears well-developed and well-nourished. No distress.  HENT:  Mouth/Throat: Oropharynx is clear and moist. No oropharyngeal  exudate.  Cardiovascular: Normal rate, regular rhythm and normal heart sounds. Exam reveals no gallop and no friction rub.  No murmur heard.  Pulmonary/Chest: Effort  normal and breath sounds normal. No respiratory distress. He has no wheezes.  Abdominal: Soft. Bowel sounds are normal. He exhibits no distension. There is no tenderness. Ostomy in place Lymphadenopathy:  He has no cervical adenopathy.  Neurological: He is alert and oriented to person, place, and time.  Skin: Skin is warm and dry. No rash noted. No erythema.  Psychiatric: He has a normal mood and affect. His behavior is normal.    Lab Results  Component Value Date   CD4TCELL 7 (L) 08/22/2019   Lab Results  Component Value Date   CD4TABS 100 (L) 08/22/2019   CD4TABS 159 (L) 06/28/2019   CD4TABS 64 (L) 05/18/2019   Lab Results  Component Value Date   HIV1RNAQUANT 53 (H) 08/22/2019   No results found for: HEPBSAB Lab Results  Component Value Date   LABRPR REACTIVE (A) 08/22/2019    CBC Lab Results  Component Value Date   WBC 4.0 08/22/2019   RBC 4.56 08/22/2019   HGB 13.8 08/22/2019   HCT 42.8 08/22/2019   PLT 181 08/22/2019   MCV 93.9 08/22/2019   MCH 30.3 08/22/2019   MCHC 32.2 08/22/2019   RDW 12.2 08/22/2019   LYMPHSABS 1,516 08/22/2019   MONOABS 0.7 05/26/2019   EOSABS 180 08/22/2019    BMET Lab Results  Component Value Date   NA 137 08/22/2019   K 3.5 08/22/2019   CL 99 08/22/2019   CO2 27 08/22/2019   GLUCOSE 100 (H) 08/22/2019   BUN 19 08/22/2019   CREATININE 1.21 08/22/2019   CALCIUM 9.0 08/22/2019   GFRNONAA 66 06/28/2019   GFRAA 76 06/28/2019      Assessment and Plan  +rpr- unclear if he had recent exposure = will treat with 1 time dose of PCN   ezcema = will recommend over the counter cream  Ostomy supplies = ? Can we see if [facility]  Dm = refill junamet  Citizenship letter- needs letter mailed to him

## 2019-09-06 NOTE — Patient Instructions (Addendum)
  Please call [facility] - Dr Byrd Hesselbach --for follow up appointment General Surgery - [facility] [place], [place] [address]  [phone]  ----------------------------------------------------- Can try over the counter cream  Cera Ve eczema cream/anti-itch cream will help

## 2019-09-07 ENCOUNTER — Telehealth: Payer: Self-pay

## 2019-09-07 NOTE — Telephone Encounter (Signed)
Pharmacy contacted RN to notify that Janumet is not covered under [employer]. Will need prescription for Metformin or Glipizide. Forwarding to provider for refills.   Anthony Jurgensen Loyola Mast, RN

## 2019-10-04 ENCOUNTER — Ambulatory Visit: Payer: Self-pay

## 2019-10-04 ENCOUNTER — Telehealth: Payer: Self-pay

## 2019-10-04 ENCOUNTER — Other Ambulatory Visit: Payer: Self-pay

## 2019-10-04 NOTE — Telephone Encounter (Signed)
Patient met with financial counselor today, during visit patient stated he needs a letter stating he is currently receiving treatment for his citizenship application. Letter needs to come from his provider and to be mailed to his house. Forwarding to provider.   Ashmi Blas Lorita Officer, RN

## 2019-10-05 ENCOUNTER — Encounter: Payer: Self-pay | Admitting: Internal Medicine

## 2019-10-05 ENCOUNTER — Telehealth: Payer: Self-pay

## 2019-10-05 NOTE — Telephone Encounter (Signed)
Unable to leave voicemail as the mailbox is not set up. Will try again later. Letter placed in outgoing mailbox   Rosanna Randy, RN

## 2019-10-05 NOTE — Telephone Encounter (Signed)
I will write letter in his chart. Can you make sure it gets to him.

## 2019-11-03 ENCOUNTER — Other Ambulatory Visit: Payer: Self-pay

## 2019-11-03 DIAGNOSIS — B20 Human immunodeficiency virus [HIV] disease: Secondary | ICD-10-CM

## 2019-11-06 ENCOUNTER — Other Ambulatory Visit: Payer: Self-pay

## 2019-11-22 ENCOUNTER — Encounter: Payer: Self-pay | Admitting: Internal Medicine

## 2019-12-04 ENCOUNTER — Telehealth: Payer: Self-pay | Admitting: *Deleted

## 2019-12-04 ENCOUNTER — Other Ambulatory Visit: Payer: Self-pay

## 2019-12-04 DIAGNOSIS — B20 Human immunodeficiency virus [HIV] disease: Secondary | ICD-10-CM

## 2019-12-04 NOTE — Telephone Encounter (Signed)
Patient here for labs, asked to speak with a nurse. He is asking for update about the envelope he dropped off 3 weeks ago at the front desk.  The envelope was from his lawyer, contained paperwork regarding his citizenship.  He is unable to read (no school as a child due to war in his home country), is unable to complete paperwork/forms or take the test and the forms from his lawyer are regarding this. Dr Drue Second out of the office.  RN unable to find the envelope at Dr Snider's pod.  Will look for this paperwork, will call patient when it is found and complete. Patient agreeable to plan. Conversation completed with help of interpreter [phone], Reuel Boom. Andree Coss, RN

## 2019-12-05 LAB — T-HELPER CELL (CD4) - (RCID CLINIC ONLY)
CD4 % Helper T Cell: 5 % — ABNORMAL LOW (ref 33–65)
CD4 T Cell Abs: 99 /uL — ABNORMAL LOW (ref 400–1790)

## 2019-12-06 LAB — COMPLETE METABOLIC PANEL WITH GFR
AG Ratio: 0.6 (calc) — ABNORMAL LOW (ref 1.0–2.5)
ALT: 27 U/L (ref 9–46)
AST: 46 U/L — ABNORMAL HIGH (ref 10–40)
Albumin: 3.4 g/dL — ABNORMAL LOW (ref 3.6–5.1)
Alkaline phosphatase (APISO): 70 U/L (ref 36–130)
BUN: 12 mg/dL (ref 7–25)
CO2: 27 mmol/L (ref 20–32)
Calcium: 8.9 mg/dL (ref 8.6–10.3)
Chloride: 100 mmol/L (ref 98–110)
Creat: 1.24 mg/dL (ref 0.60–1.35)
GFR, Est African American: 79 mL/min/{1.73_m2} (ref >=60)
GFR, Est Non African American: 68 mL/min/{1.73_m2} (ref >=60)
Globulin: 6 g/dL (calc) — ABNORMAL HIGH (ref 1.9–3.7)
Glucose, Bld: 106 mg/dL — ABNORMAL HIGH (ref 65–99)
Potassium: 4.5 mmol/L (ref 3.5–5.3)
Sodium: 135 mmol/L (ref 135–146)
Total Bilirubin: 0.5 mg/dL (ref 0.2–1.2)
Total Protein: 9.4 g/dL — ABNORMAL HIGH (ref 6.1–8.1)

## 2019-12-06 LAB — CBC WITH DIFFERENTIAL/PLATELET
Absolute Monocytes: 475 cells/uL (ref 200–950)
Basophils Absolute: 21 cells/uL (ref 0–200)
Basophils Relative: 0.5 %
Eosinophils Absolute: 130 cells/uL (ref 15–500)
Eosinophils Relative: 3.1 %
HCT: 46.6 % (ref 38.5–50.0)
Hemoglobin: 15.3 g/dL (ref 13.2–17.1)
Lymphs Abs: 2772 cells/uL (ref 850–3900)
MCH: 31.2 pg (ref 27.0–33.0)
MCHC: 32.8 g/dL (ref 32.0–36.0)
MCV: 95.1 fL (ref 80.0–100.0)
MPV: 11.8 fL (ref 7.5–12.5)
Monocytes Relative: 11.3 %
Neutro Abs: 802 cells/uL — ABNORMAL LOW (ref 1500–7800)
Neutrophils Relative %: 19.1 %
Platelets: 123 10*3/uL — ABNORMAL LOW (ref 140–400)
RBC: 4.9 10*6/uL (ref 4.20–5.80)
RDW: 12.9 % (ref 11.0–15.0)
Total Lymphocyte: 66 %
WBC: 4.2 10*3/uL (ref 3.8–10.8)

## 2019-12-06 LAB — HIV-1 RNA QUANT-NO REFLEX-BLD
HIV 1 RNA Quant: 63400 Copies/mL — ABNORMAL HIGH
HIV-1 RNA Quant, Log: 4.8 Log cps/mL — ABNORMAL HIGH

## 2019-12-18 ENCOUNTER — Other Ambulatory Visit: Payer: Self-pay

## 2019-12-18 ENCOUNTER — Ambulatory Visit (INDEPENDENT_AMBULATORY_CARE_PROVIDER_SITE_OTHER): Payer: Self-pay | Admitting: Internal Medicine

## 2019-12-18 ENCOUNTER — Encounter: Payer: Self-pay | Admitting: Internal Medicine

## 2019-12-18 VITALS — BP 111/73 | HR 118 | Temp 98.0°F | Resp 97

## 2019-12-18 DIAGNOSIS — B20 Human immunodeficiency virus [HIV] disease: Secondary | ICD-10-CM

## 2019-12-18 DIAGNOSIS — E1100 Type 2 diabetes mellitus with hyperosmolarity without nonketotic hyperglycemic-hyperosmolar coma (NKHHC): Secondary | ICD-10-CM

## 2019-12-18 DIAGNOSIS — Z794 Long term (current) use of insulin: Secondary | ICD-10-CM

## 2019-12-18 MED ORDER — SYMTUZA 800-150-200-10 MG PO TABS: 1.0000 | ORAL_TABLET | Freq: Every day | ORAL | 11 refills | Status: DC

## 2019-12-18 MED ORDER — TIVICAY 50 MG PO TABS: 50.0000 mg | ORAL_TABLET | Freq: Every day | ORAL | 11 refills | Status: DC

## 2019-12-18 MED ORDER — SULFAMETHOXAZOLE-TRIMETHOPRIM 800-160 MG PO TABS: 1.0000 | ORAL_TABLET | Freq: Every day | ORAL | 11 refills | Status: DC

## 2019-12-18 NOTE — Progress Notes (Signed)
Patient ID: [name], male   DOB: 1970/05/04, 49 y.o.   MRN: [id number]  HPI [name] is a 49yo M with advanced hiv disease, currently taking tivicay-symtuza regularly. He states that he  Has upcoming appt January 2nd -with [facility] surgery team in terms of further management of ostomy, if he is going to be reversed.   Has received covid vaccine and flu vaccine  - [facility]  ( 2weeks apart?)  He continues to take janumet for his diabetes without difficulty  He is applying fo greencard process with help of lawyers  Outpatient Encounter Medications as of 12/18/2019  Medication Sig  . Darunavir-Cobicisctat-Emtricitabine-Tenofovir Alafenamide (SYMTUZA) 800-150-200-10 MG TABS Take 1 tablet by mouth daily with breakfast.  . dolutegravir (TIVICAY) 50 MG tablet Take 1 tablet (50 mg total) by mouth daily.  . ferrous sulfate 325 (65 FE) MG tablet Take 1 tablet (325 mg total) by mouth daily with breakfast.  . guaiFENesin (MUCINEX) 600 MG 12 hr tablet Take 1 tablet (600 mg total) by mouth 2 (two) times daily.  . sitaGLIPtin-metformin (JANUMET) 50-500 MG tablet Take 1 tablet by mouth 2 (two) times daily with a meal.  . sulfamethoxazole-trimethoprim (BACTRIM DS) 800-160 MG tablet Take 1 tablet by mouth daily.   No facility-administered encounter medications on file as of 12/18/2019.     Patient Active Problem List   Diagnosis Date Noted  . MSSA bacteremia   . Acute respiratory failure with hypoxia ([name]) 05/17/2019  . Type 2 diabetes mellitus ([name]) 05/17/2019  . DKA (diabetic ketoacidoses) 11/10/2018  . Pulmonary emphysema ([name])   . 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 Chronic systolic CHF (congestive heart failure) ([name]) 04/24/2016  . AKI (acute kidney injury) ([name]) 04/24/2016  . 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 Normochromic normocytic anemia 02/25/2016  . Protein-calorie malnutrition, severe  02/25/2016  . Renal insufficiency   . S/P ORIF (open reduction internal fixation) fracture 03/21/2014  . Multifocal pneumonia 09/27/2013  . HIV (human immunodeficiency virus infection) ([name]) 09/18/2013  . Refugee health examination 09/18/2013     Health Maintenance Due  Topic Date Due  . FOOT EXAM  Never done  . OPHTHALMOLOGY EXAM  Never done  . URINE MICROALBUMIN  Never done  . COVID-19 Vaccine (1) Never done  . TETANUS/TDAP  Never done  . INFLUENZA VACCINE  10/15/2019  . HEMOGLOBIN A1C  11/17/2019     Review of Systems Review of Systems  Constitutional: Negative for fever, chills, diaphoresis, activity change, appetite change, fatigue and unexpected weight change.  HENT: Negative for congestion, sore throat, rhinorrhea, sneezing, trouble swallowing and sinus pressure.  Eyes: Negative for photophobia and visual disturbance.  Respiratory: Negative for cough, chest tightness, shortness of breath, wheezing and stridor.  Cardiovascular: Negative for chest pain, palpitations and leg swelling.  Gastrointestinal: Negative for nausea, vomiting, abdominal pain, diarrhea, constipation, blood in stool, abdominal distention and anal bleeding.  Genitourinary: Negative for dysuria, hematuria, flank pain and difficulty urinating.  Musculoskeletal: Negative for myalgias, back pain, joint swelling, arthralgias and gait problem.  Skin: Negative for color change, pallor, rash and wound.  Neurological: Negative for dizziness, tremors, weakness and light-headedness.  Hematological: Negative for adenopathy. Does not bruise/bleed easily.  Psychiatric/Behavioral: Negative for behavioral problems, confusion, sleep disturbance, dysphoric mood, decreased concentration and agitation.    Physical Exam   BP 111/73   Pulse (!) 118   Temp 98 F (36.7 C)  Resp (!) 97   Physical Exam  Constitutional: He is oriented to person, place, and time. He appears well-developed and well-nourished. No distress.    HENT:  Mouth/Throat: Oropharynx is clear and moist. No oropharyngeal exudate.  Cardiovascular: Normal rate, regular rhythm and normal heart sounds. Exam reveals no gallop and no friction rub.  No murmur heard.  Pulmonary/Chest: Effort normal and breath sounds normal. No respiratory distress. He has no wheezes.  Abdominal: Soft. Bowel sounds are normal. He exhibits no distension. There is no tenderness. Ostomy in place. Neurological: He is alert and oriented to person, place, and time.  Skin: Skin is warm and dry. No rash noted. No erythema.  Psychiatric: He has a normal mood and affect. His behavior is normal.    Lab Results  Component Value Date   CD4TCELL 5 (L) 12/04/2019   Lab Results  Component Value Date   CD4TABS 99 (L) 12/04/2019   CD4TABS 100 (L) 08/22/2019   CD4TABS 159 (L) 06/28/2019   Lab Results  Component Value Date   HIV1RNAQUANT 63,400 (H) 12/04/2019   No results found for: HEPBSAB Lab Results  Component Value Date   LABRPR REACTIVE (A) 08/22/2019    CBC Lab Results  Component Value Date   WBC 4.2 12/04/2019   RBC 4.90 12/04/2019   HGB 15.3 12/04/2019   HCT 46.6 12/04/2019   PLT 123 (L) 12/04/2019   MCV 95.1 12/04/2019   MCH 31.2 12/04/2019   MCHC 32.8 12/04/2019   RDW 12.9 12/04/2019   LYMPHSABS 2,772 12/04/2019   MONOABS 0.7 05/26/2019   EOSABS 130 12/04/2019    BMET Lab Results  Component Value Date   NA 135 12/04/2019   K 4.5 12/04/2019   CL 100 12/04/2019   CO2 27 12/04/2019   GLUCOSE 106 (H) 12/04/2019   BUN 12 12/04/2019   CREATININE 1.24 12/04/2019   CALCIUM 8.9 12/04/2019   GFRNONAA 68 12/04/2019   GFRAA 79 12/04/2019      Assessment and Plan   Health maintenance =Will need 3rd dose due to immunocompromised. Will check his card at home to figure out which vaccine received  Advanced hiv disease= Will heck HIV VL and resistance . Also recommend that he will need to Pick up prescription for bactrim for oi proph  dm2 = cant  afford janumet. Will give metformin 500mg  bid (more affordable)  ckd 2 = stable cr

## 2019-12-29 ENCOUNTER — Telehealth: Payer: Self-pay | Admitting: *Deleted

## 2019-12-29 NOTE — Telephone Encounter (Signed)
Dr Drue Second brought completed forms. RN notified patient, sent copy to his liaison at [facility]. Copy placed in box for scanning to chart, copy placed in envelope at front for patient to pick up, original in Dr Snider's pod. Andree Coss, RN

## 2020-01-05 ENCOUNTER — Other Ambulatory Visit: Payer: Self-pay

## 2020-01-05 ENCOUNTER — Telehealth: Payer: Self-pay | Admitting: *Deleted

## 2020-01-05 ENCOUNTER — Ambulatory Visit (INDEPENDENT_AMBULATORY_CARE_PROVIDER_SITE_OTHER): Payer: Self-pay

## 2020-01-05 DIAGNOSIS — Z23 Encounter for immunization: Secondary | ICD-10-CM

## 2020-01-05 LAB — HIV-1 RNA ULTRAQUANT REFLEX TO GENTYP+
HIV 1 RNA Quant: 6550 copies/mL — ABNORMAL HIGH
HIV-1 RNA Quant, Log: 3.82 Log copies/mL — ABNORMAL HIGH

## 2020-01-05 LAB — HIV-1 GENOTYPE

## 2020-01-05 NOTE — Telephone Encounter (Signed)
Patient here for covid vaccination, asked to speak with a nurse regarding his medication.  He needs [facility] to send the prescriptions to his mail box. RN confirmed address, agreed to fax message to [facility] asking them to mail all of [name] medications to [address], [id number].   Andree Coss, RN

## 2020-01-24 LAB — HIV-1 GENOTYPE

## 2020-03-18 ENCOUNTER — Ambulatory Visit: Payer: Self-pay | Admitting: Internal Medicine

## 2020-03-25 ENCOUNTER — Encounter: Payer: Self-pay | Admitting: Internal Medicine

## 2020-03-25 ENCOUNTER — Ambulatory Visit (INDEPENDENT_AMBULATORY_CARE_PROVIDER_SITE_OTHER): Payer: Self-pay | Admitting: Internal Medicine

## 2020-03-25 ENCOUNTER — Other Ambulatory Visit: Payer: Self-pay

## 2020-03-25 VITALS — BP 120/81 | HR 97 | Temp 98.0°F

## 2020-03-25 DIAGNOSIS — Z79899 Other long term (current) drug therapy: Secondary | ICD-10-CM

## 2020-03-25 DIAGNOSIS — Z939 Artificial opening status, unspecified: Secondary | ICD-10-CM

## 2020-03-25 DIAGNOSIS — B20 Human immunodeficiency virus [HIV] disease: Secondary | ICD-10-CM

## 2020-03-25 NOTE — Progress Notes (Signed)
Patient ID: [name], male   DOB: Dec 06, 1970, 50 y.o.   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 He reports that it is unclear why he has not had his medications for roughly 1 month (suspect language barrier/illiteracy). Now his medications will be mailed to him. He is Going to surgery appt on feb 15th. He has been doing his application with [place] immigration lawyer for green card application.  Received 3 doses of covid vaccine ( in oct 2021, here in clinic)   With interpreter Ros: no fever chills, nigthsweats, 12 point ros is negative  Sochx: no smoking or drinking  Outpatient Encounter Medications as of 03/25/2020  Medication Sig  . Darunavir-Cobicisctat-Emtricitabine-Tenofovir Alafenamide (SYMTUZA) 800-150-200-10 MG TABS Take 1 tablet by mouth daily with breakfast.  . dolutegravir (TIVICAY) 50 MG tablet Take 1 tablet (50 mg total) by mouth daily.  . ferrous sulfate 325 (65 FE) MG tablet Take 1 tablet (325 mg total) by mouth daily with breakfast.  . guaiFENesin (MUCINEX) 600 MG 12 hr tablet Take 1 tablet (600 mg total) by mouth 2 (two) times daily.  . sitaGLIPtin-metformin (JANUMET) 50-500 MG tablet Take 1 tablet by mouth 2 (two) times daily with a meal.  . sulfamethoxazole-trimethoprim (BACTRIM DS) 800-160 MG tablet Take 1 tablet by mouth daily.   No facility-administered encounter medications on file as of 03/25/2020.     Patient Active Problem List   Diagnosis Date Noted  . MSSA bacteremia   . Acute respiratory failure with hypoxia ([name]) 05/17/2019  . Type 2 diabetes mellitus ([name]) 05/17/2019  . DKA (diabetic ketoacidoses) 11/10/2018  . Pulmonary emphysema ([name])   . 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 Chronic systolic CHF (congestive heart failure) ([name]) 04/24/2016  . AKI (acute kidney injury) ([name])  04/24/2016  . 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 Normochromic normocytic anemia 02/25/2016  . Protein-calorie malnutrition, severe 02/25/2016  . Renal insufficiency   . S/P ORIF (open reduction internal fixation) fracture 03/21/2014  . Multifocal pneumonia 09/27/2013  . HIV (human immunodeficiency virus infection) ([name]) 09/18/2013  . Refugee health examination 09/18/2013     Health Maintenance Due  Topic Date Due  . FOOT EXAM  Never done  . OPHTHALMOLOGY EXAM  Never done  . URINE MICROALBUMIN  Never done  . TETANUS/TDAP  Never done  . INFLUENZA VACCINE  10/15/2019  . HEMOGLOBIN A1C  11/17/2019  . COVID-19 Vaccine (2 - Pfizer risk 4-dose series) 01/26/2020     Review of Systems Review of Systems  Constitutional: Negative for fever, chills, diaphoresis, activity change, appetite change, fatigue and unexpected weight change.  HENT: Negative for congestion, sore throat, rhinorrhea, sneezing, trouble swallowing and sinus pressure.  Eyes: Negative for photophobia and visual disturbance.  Respiratory: Negative for cough, chest tightness, shortness of breath, wheezing and stridor.  Cardiovascular: Negative for chest pain, palpitations and leg swelling.  Gastrointestinal: Negative for nausea, vomiting, abdominal pain, diarrhea, constipation, blood in stool, abdominal distention and anal bleeding.  Genitourinary: Negative for dysuria, hematuria, flank pain and difficulty urinating.  Musculoskeletal: Negative for myalgias, back pain, joint swelling, arthralgias and gait problem.  Skin: Negative for color change, pallor, rash and wound.  Neurological: Negative for dizziness, tremors, weakness and light-headedness.  Hematological: Negative for adenopathy. Does not bruise/bleed easily.  Psychiatric/Behavioral: Negative for behavioral problems,  confusion, sleep disturbance, dysphoric mood, decreased concentration and agitation.    Physical Exam   BP 120/81    Pulse 97   Temp 98 F (36.7 C)   Physical Exam  Constitutional: He is oriented to person, place, and time. He appears well-developed and well-nourished. No distress.  HENT:  Mouth/Throat: Oropharynx is clear and moist. No oropharyngeal exudate.  Cardiovascular: Normal rate, regular rhythm and normal heart sounds. Exam reveals no gallop and no friction rub.  No murmur heard.  Pulmonary/Chest: Effort normal and breath sounds normal. No respiratory distress. He has no wheezes.  Abdominal: Soft. Bowel sounds are normal. He exhibits no distension. There is no tenderness. Ostomy bag is place Lymphadenopathy:  He has no cervical adenopathy.  Neurological: He is alert and oriented to person, place, and time.  Skin: Skin is warm and dry. No rash noted. No erythema.  Psychiatric: He has a normal mood and affect. His behavior is normal.    Lab Results  Component Value Date   CD4TCELL 5 (L) 12/04/2019   Lab Results  Component Value Date   CD4TABS 99 (L) 12/04/2019   CD4TABS 100 (L) 08/22/2019   CD4TABS 159 (L) 06/28/2019   Lab Results  Component Value Date   HIV1RNAQUANT 6,550 (H) 12/18/2019   No results found for: HEPBSAB Lab Results  Component Value Date   LABRPR REACTIVE (A) 08/22/2019    CBC Lab Results  Component Value Date   WBC 4.2 12/04/2019   RBC 4.90 12/04/2019   HGB 15.3 12/04/2019   HCT 46.6 12/04/2019   PLT 123 (L) 12/04/2019   MCV 95.1 12/04/2019   MCH 31.2 12/04/2019   MCHC 32.8 12/04/2019   RDW 12.9 12/04/2019   LYMPHSABS 2,772 12/04/2019   MONOABS 0.7 05/26/2019   EOSABS 130 12/04/2019    BMET Lab Results  Component Value Date   NA 135 12/04/2019   K 4.5 12/04/2019   CL 100 12/04/2019   CO2 27 12/04/2019   GLUCOSE 106 (H) 12/04/2019   BUN 12 12/04/2019   CREATININE 1.24 12/04/2019   CALCIUM 8.9 12/04/2019   GFRNONAA 68 12/04/2019   GFRAA 79 12/04/2019      Assessment and Plan  hiv disease = poorly controlled. Difficulty getting access  to meds. Have discussed with pharmacy that continue to plan to have the medication mailed to him.will check labs today.  Ostomy = continue to follow up with surgery to discuss possibility for reversal, initially placed in the setting of healing gu HPV infection  Long term medication management = cr is stable. Will continue to monitor with labs today

## 2020-08-21 IMAGING — DX PORTABLE CHEST - 1 VIEW
1 series · 2 of 2 positions shown · non-contrast
Comparison: 11/21/2017 and prior radiographs

CLINICAL DATA: Cough and shortness of breath

EXAM:
PORTABLE CHEST 1 VIEW

[Series 1: chest · 0.14mm/px · 2 of 2 slices shown]
[im 1/2]
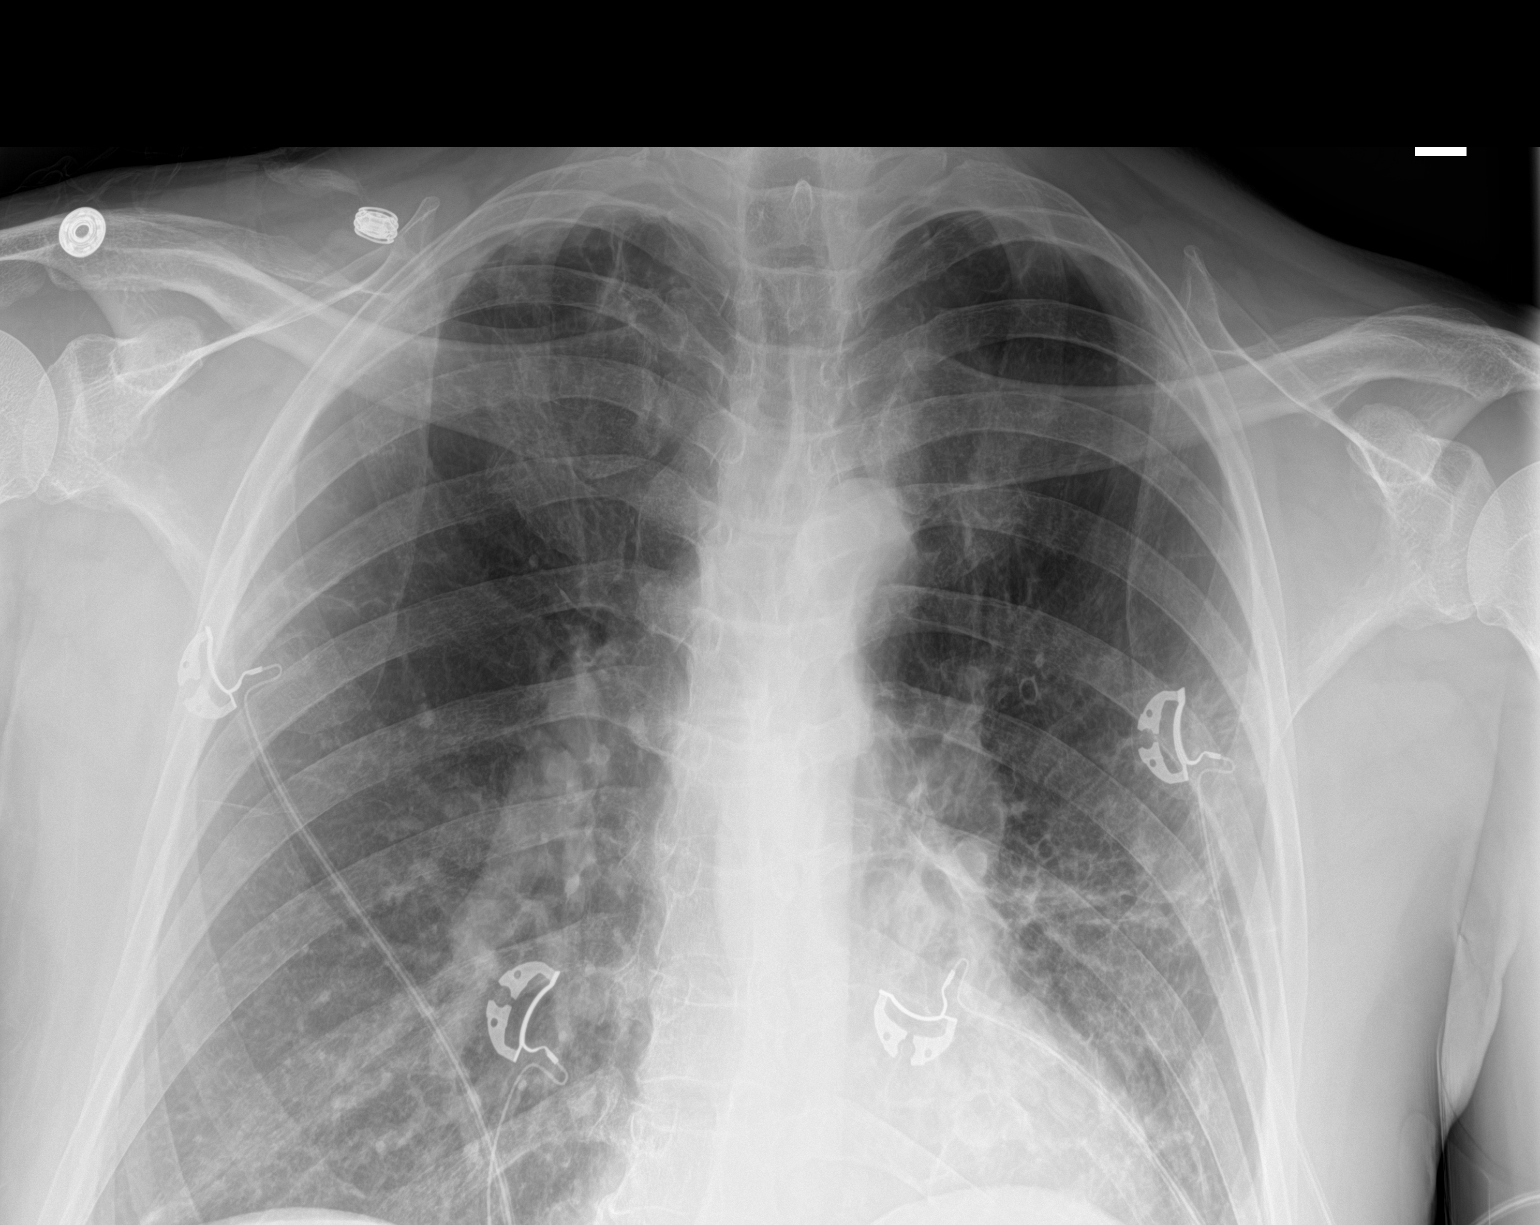
[im 2/2]
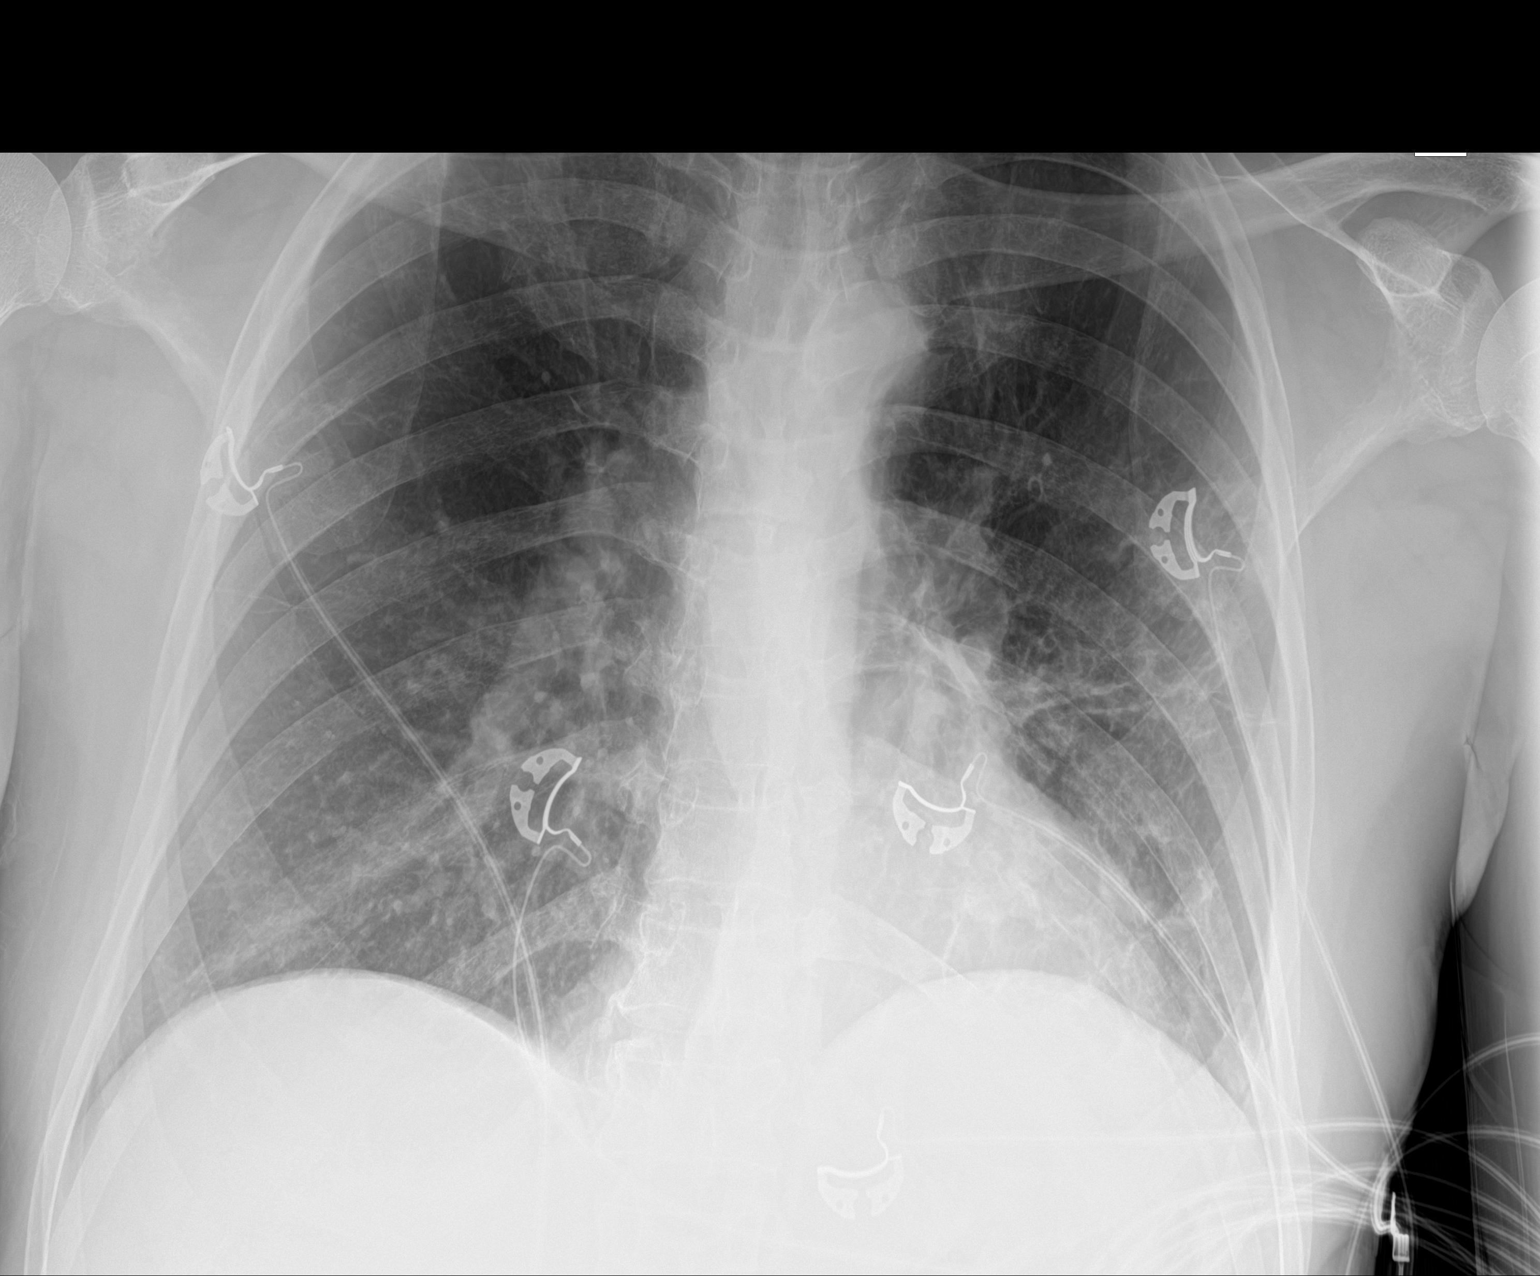

[2 of 2 positions shown; findings below may reference images not displayed]

FINDINGS: The cardiomediastinal silhouette is unremarkable.

Chronic pulmonary changes/scarring within the LOWER lungs,
LEFT-greater-than-RIGHT, again noted.

There is no evidence of focal airspace disease, pulmonary edema,
suspicious pulmonary nodule/mass, pleural effusion, or pneumothorax.

No acute bony abnormalities are identified.
IMPRESSION: No evidence of acute cardiopulmonary disease.

## 2020-09-11 ENCOUNTER — Other Ambulatory Visit: Payer: Self-pay | Admitting: Family

## 2020-09-11 DIAGNOSIS — B20 Human immunodeficiency virus [HIV] disease: Secondary | ICD-10-CM

## 2020-09-23 ENCOUNTER — Ambulatory Visit: Payer: Self-pay | Admitting: Internal Medicine

## 2020-11-01 ENCOUNTER — Emergency Department (HOSPITAL_COMMUNITY): Payer: BC Managed Care – PPO

## 2020-11-01 ENCOUNTER — Inpatient Hospital Stay (HOSPITAL_COMMUNITY)
Admission: EM | Admit: 2020-11-01 | Discharge: 2020-11-05 | DRG: 974 | Disposition: A | Discharge status: Home / Self Care | Payer: BC Managed Care – PPO | Attending: Internal Medicine | Admitting: Internal Medicine

## 2020-11-01 ENCOUNTER — Other Ambulatory Visit: Payer: Self-pay

## 2020-11-01 ENCOUNTER — Encounter (HOSPITAL_COMMUNITY): Payer: Self-pay | Admitting: Emergency Medicine

## 2020-11-01 DIAGNOSIS — I110 Hypertensive heart disease with heart failure: Secondary | ICD-10-CM | POA: Diagnosis present

## 2020-11-01 DIAGNOSIS — I5022 Chronic systolic (congestive) heart failure: Secondary | ICD-10-CM | POA: Diagnosis present

## 2020-11-01 DIAGNOSIS — E1122 Type 2 diabetes mellitus with diabetic chronic kidney disease: Secondary | ICD-10-CM | POA: Diagnosis present

## 2020-11-01 DIAGNOSIS — Z8611 Personal history of tuberculosis: Secondary | ICD-10-CM

## 2020-11-01 DIAGNOSIS — A419 Sepsis, unspecified organism: Secondary | ICD-10-CM | POA: Diagnosis present

## 2020-11-01 DIAGNOSIS — I2699 Other pulmonary embolism without acute cor pulmonale: Secondary | ICD-10-CM | POA: Diagnosis present

## 2020-11-01 DIAGNOSIS — R0602 Shortness of breath: Secondary | ICD-10-CM | POA: Diagnosis not present

## 2020-11-01 DIAGNOSIS — Z8619 Personal history of other infectious and parasitic diseases: Secondary | ICD-10-CM | POA: Diagnosis present

## 2020-11-01 DIAGNOSIS — Z933 Colostomy status: Secondary | ICD-10-CM

## 2020-11-01 DIAGNOSIS — Z87891 Personal history of nicotine dependence: Secondary | ICD-10-CM | POA: Diagnosis not present

## 2020-11-01 DIAGNOSIS — N1831 Chronic kidney disease, stage 3a: Secondary | ICD-10-CM | POA: Diagnosis present

## 2020-11-01 DIAGNOSIS — J439 Emphysema, unspecified: Secondary | ICD-10-CM | POA: Diagnosis present

## 2020-11-01 DIAGNOSIS — D849 Immunodeficiency, unspecified: Secondary | ICD-10-CM | POA: Diagnosis not present

## 2020-11-01 DIAGNOSIS — B20 Human immunodeficiency virus [HIV] disease: Secondary | ICD-10-CM | POA: Diagnosis present

## 2020-11-01 DIAGNOSIS — I2602 Saddle embolus of pulmonary artery with acute cor pulmonale: Secondary | ICD-10-CM | POA: Diagnosis not present

## 2020-11-01 DIAGNOSIS — J9601 Acute respiratory failure with hypoxia: Secondary | ICD-10-CM | POA: Diagnosis present

## 2020-11-01 DIAGNOSIS — Z79899 Other long term (current) drug therapy: Secondary | ICD-10-CM

## 2020-11-01 DIAGNOSIS — Z7984 Long term (current) use of oral hypoglycemic drugs: Secondary | ICD-10-CM | POA: Diagnosis not present

## 2020-11-01 DIAGNOSIS — E1169 Type 2 diabetes mellitus with other specified complication: Secondary | ICD-10-CM

## 2020-11-01 DIAGNOSIS — J189 Pneumonia, unspecified organism: Secondary | ICD-10-CM | POA: Diagnosis present

## 2020-11-01 DIAGNOSIS — Z8249 Family history of ischemic heart disease and other diseases of the circulatory system: Secondary | ICD-10-CM

## 2020-11-01 DIAGNOSIS — E119 Type 2 diabetes mellitus without complications: Secondary | ICD-10-CM

## 2020-11-01 DIAGNOSIS — I82409 Acute embolism and thrombosis of unspecified deep veins of unspecified lower extremity: Secondary | ICD-10-CM | POA: Diagnosis present

## 2020-11-01 DIAGNOSIS — N182 Chronic kidney disease, stage 2 (mild): Secondary | ICD-10-CM | POA: Diagnosis present

## 2020-11-01 DIAGNOSIS — N1832 Chronic kidney disease, stage 3b: Secondary | ICD-10-CM | POA: Diagnosis present

## 2020-11-01 DIAGNOSIS — N183 Chronic kidney disease, stage 3 unspecified: Secondary | ICD-10-CM | POA: Diagnosis present

## 2020-11-01 DIAGNOSIS — Z20822 Contact with and (suspected) exposure to covid-19: Secondary | ICD-10-CM | POA: Diagnosis present

## 2020-11-01 HISTORY — DX: Other pulmonary embolism without acute cor pulmonale: I26.99

## 2020-11-01 HISTORY — DX: Personal history of tuberculosis: Z86.11

## 2020-11-01 HISTORY — DX: Personal history of other infectious and parasitic diseases: Z86.19

## 2020-11-01 LAB — URINALYSIS, ROUTINE W REFLEX MICROSCOPIC
Bacteria, UA: NONE SEEN
Bilirubin Urine: NEGATIVE
Glucose, UA: NEGATIVE mg/dL
Ketones, ur: NEGATIVE mg/dL
Leukocytes,Ua: NEGATIVE
Nitrite: NEGATIVE
Protein, ur: 30 mg/dL — AB
Specific Gravity, Urine: 1.039 — ABNORMAL HIGH (ref 1.005–1.030)
pH: 6 (ref 5.0–8.0)

## 2020-11-01 LAB — CBC WITH DIFFERENTIAL/PLATELET
Abs Immature Granulocytes: 0.05 10*3/uL (ref 0.00–0.07)
Basophils Absolute: 0 10*3/uL (ref 0.0–0.1)
Basophils Relative: 0 %
Eosinophils Absolute: 0 10*3/uL (ref 0.0–0.5)
Eosinophils Relative: 0 %
HCT: 44.9 % (ref 39.0–52.0)
Hemoglobin: 14.7 g/dL (ref 13.0–17.0)
Immature Granulocytes: 1 %
Lymphocytes Relative: 19 %
Lymphs Abs: 1.6 10*3/uL (ref 0.7–4.0)
MCH: 31.7 pg (ref 26.0–34.0)
MCHC: 32.7 g/dL (ref 30.0–36.0)
MCV: 97 fL (ref 80.0–100.0)
Monocytes Absolute: 0.9 10*3/uL (ref 0.1–1.0)
Monocytes Relative: 11 %
Neutro Abs: 5.8 10*3/uL (ref 1.7–7.7)
Neutrophils Relative %: 69 %
Platelets: 124 10*3/uL — ABNORMAL LOW (ref 150–400)
RBC: 4.63 MIL/uL (ref 4.22–5.81)
RDW: 14.8 % (ref 11.5–15.5)
WBC: 8.4 10*3/uL (ref 4.0–10.5)
nRBC: 0 % (ref 0.0–0.2)

## 2020-11-01 LAB — CRYPTOCOCCAL ANTIGEN: Crypto Ag: NEGATIVE

## 2020-11-01 LAB — HEMOGLOBIN A1C
Hgb A1c MFr Bld: 6 % — ABNORMAL HIGH (ref 4.8–5.6)
Mean Plasma Glucose: 125.5 mg/dL

## 2020-11-01 LAB — I-STAT VENOUS BLOOD GAS, ED
Acid-Base Excess: 0 mmol/L (ref 0.0–2.0)
Bicarbonate: 25.1 mmol/L (ref 20.0–28.0)
Calcium, Ion: 1.14 mmol/L — ABNORMAL LOW (ref 1.15–1.40)
HCT: 47 % (ref 39.0–52.0)
Hemoglobin: 16 g/dL (ref 13.0–17.0)
O2 Saturation: 94 %
Potassium: 4.1 mmol/L (ref 3.5–5.1)
Sodium: 138 mmol/L (ref 135–145)
TCO2: 26 mmol/L (ref 22–32)
pCO2, Ven: 41.4 mmHg — ABNORMAL LOW (ref 44.0–60.0)
pH, Ven: 7.391 (ref 7.250–7.430)
pO2, Ven: 72 mmHg — ABNORMAL HIGH (ref 32.0–45.0)

## 2020-11-01 LAB — RESP PANEL BY RT-PCR (FLU A&B, COVID) ARPGX2
Influenza A by PCR: NEGATIVE
Influenza B by PCR: NEGATIVE
SARS Coronavirus 2 by RT PCR: NEGATIVE

## 2020-11-01 LAB — LACTIC ACID, PLASMA
Lactic Acid, Venous: 1.4 mmol/L (ref 0.5–1.9)
Lactic Acid, Venous: 1.4 mmol/L (ref 0.5–1.9)

## 2020-11-01 LAB — COMPREHENSIVE METABOLIC PANEL
ALT: 29 U/L (ref 0–44)
AST: 36 U/L (ref 15–41)
Albumin: 2.5 g/dL — ABNORMAL LOW (ref 3.5–5.0)
Alkaline Phosphatase: 55 U/L (ref 38–126)
Anion gap: 12 (ref 5–15)
BUN: 27 mg/dL — ABNORMAL HIGH (ref 6–20)
CO2: 23 mmol/L (ref 22–32)
Calcium: 9.2 mg/dL (ref 8.9–10.3)
Chloride: 99 mmol/L (ref 98–111)
Creatinine, Ser: 1.65 mg/dL — ABNORMAL HIGH (ref 0.61–1.24)
GFR, Estimated: 51 mL/min — ABNORMAL LOW (ref >60)
Glucose, Bld: 171 mg/dL — ABNORMAL HIGH (ref 70–99)
Potassium: 4.4 mmol/L (ref 3.5–5.1)
Sodium: 134 mmol/L — ABNORMAL LOW (ref 135–145)
Total Bilirubin: 0.9 mg/dL (ref 0.3–1.2)
Total Protein: 10.7 g/dL — ABNORMAL HIGH (ref 6.5–8.1)

## 2020-11-01 LAB — CBG MONITORING, ED
Glucose-Capillary: 186 mg/dL — ABNORMAL HIGH (ref 70–99)
Glucose-Capillary: 93 mg/dL (ref 70–99)

## 2020-11-01 LAB — PROCALCITONIN: Procalcitonin: 0.41 ng/mL

## 2020-11-01 LAB — LIPASE, BLOOD: Lipase: 45 U/L (ref 11–51)

## 2020-11-01 LAB — TROPONIN I (HIGH SENSITIVITY): Troponin I (High Sensitivity): 24 ng/L — ABNORMAL HIGH (ref <18)

## 2020-11-01 MED ORDER — SODIUM CHLORIDE 0.9 % IV SOLN
2.0000 g | Freq: Once | INTRAVENOUS | Status: AC
  Administered 2020-11-01: 2 g via INTRAVENOUS
  Filled 2020-11-01: qty 2

## 2020-11-01 MED ORDER — HEPARIN (PORCINE) 25000 UT/250ML-% IV SOLN
2200.0000 [IU]/h | INTRAVENOUS | Status: DC
  Administered 2020-11-01: 1350 [IU]/h via INTRAVENOUS
  Administered 2020-11-02: 1650 [IU]/h via INTRAVENOUS
  Administered 2020-11-03: 2000 [IU]/h via INTRAVENOUS
  Filled 2020-11-01 (×3): qty 250

## 2020-11-01 MED ORDER — ACETAMINOPHEN 325 MG PO TABS
650.0000 mg | ORAL_TABLET | Freq: Four times a day (QID) | ORAL | Status: DC | PRN
  Administered 2020-11-02 – 2020-11-04 (×3): 650 mg via ORAL
  Filled 2020-11-01 (×3): qty 2

## 2020-11-01 MED ORDER — IOHEXOL 350 MG/ML SOLN
100.0000 mL | Freq: Once | INTRAVENOUS | Status: AC | PRN
  Administered 2020-11-01: 100 mL via INTRAVENOUS

## 2020-11-01 MED ORDER — ACETAMINOPHEN 650 MG RE SUPP: 650.0000 mg | Freq: Four times a day (QID) | RECTAL | Status: DC | PRN

## 2020-11-01 MED ORDER — METRONIDAZOLE 500 MG/100ML IV SOLN
500.0000 mg | Freq: Once | INTRAVENOUS | Status: AC
  Administered 2020-11-01: 500 mg via INTRAVENOUS
  Filled 2020-11-01: qty 100

## 2020-11-01 MED ORDER — SODIUM CHLORIDE 0.9 % IV SOLN
2.0000 g | Freq: Three times a day (TID) | INTRAVENOUS | Status: DC
  Administered 2020-11-02: 2 g via INTRAVENOUS
  Filled 2020-11-01: qty 2

## 2020-11-01 MED ORDER — LACTATED RINGERS IV SOLN: INTRAVENOUS | Status: DC

## 2020-11-01 MED ORDER — VANCOMYCIN HCL 1750 MG/350ML IV SOLN
1750.0000 mg | Freq: Once | INTRAVENOUS | Status: AC
  Administered 2020-11-02: 1750 mg via INTRAVENOUS
  Filled 2020-11-01 (×2): qty 350

## 2020-11-01 MED ORDER — ONDANSETRON HCL 4 MG/2ML IJ SOLN: 4.0000 mg | Freq: Four times a day (QID) | INTRAMUSCULAR | Status: DC | PRN

## 2020-11-01 MED ORDER — INSULIN ASPART 100 UNIT/ML IJ SOLN
0.0000 [IU] | INTRAMUSCULAR | Status: DC
  Administered 2020-11-02 – 2020-11-03 (×5): 1 [IU] via SUBCUTANEOUS
  Administered 2020-11-04: 2 [IU] via SUBCUTANEOUS
  Administered 2020-11-04: 1 [IU] via SUBCUTANEOUS
  Administered 2020-11-05: 2 [IU] via SUBCUTANEOUS
  Administered 2020-11-05: 1 [IU] via SUBCUTANEOUS

## 2020-11-01 MED ORDER — SULFAMETHOXAZOLE-TRIMETHOPRIM 800-160 MG PO TABS
1.0000 | ORAL_TABLET | Freq: Every day | ORAL | Status: DC
  Administered 2020-11-02 – 2020-11-05 (×4): 1 via ORAL
  Filled 2020-11-01 (×4): qty 1

## 2020-11-01 MED ORDER — HEPARIN BOLUS VIA INFUSION
4500.0000 [IU] | Freq: Once | INTRAVENOUS | Status: AC
  Administered 2020-11-01: 4500 [IU] via INTRAVENOUS
  Filled 2020-11-01: qty 4500

## 2020-11-01 MED ORDER — ONDANSETRON HCL 4 MG PO TABS: 4.0000 mg | ORAL_TABLET | Freq: Four times a day (QID) | ORAL | Status: DC | PRN

## 2020-11-01 MED ORDER — LACTATED RINGERS IV BOLUS (SEPSIS)
1000.0000 mL | Freq: Once | INTRAVENOUS | Status: AC
  Administered 2020-11-01: 1000 mL via INTRAVENOUS

## 2020-11-01 MED ORDER — VANCOMYCIN HCL IN DEXTROSE 1-5 GM/200ML-% IV SOLN
1000.0000 mg | Freq: Two times a day (BID) | INTRAVENOUS | Status: DC
  Administered 2020-11-02: 1000 mg via INTRAVENOUS
  Filled 2020-11-01: qty 200

## 2020-11-01 MED ORDER — ACETAMINOPHEN 325 MG PO TABS
650.0000 mg | ORAL_TABLET | ORAL | Status: AC
  Administered 2020-11-01: 650 mg via ORAL
  Filled 2020-11-01: qty 2

## 2020-11-01 NOTE — H&P (Signed)
History and Physical    [name]:[id number] DOB: July 02, 1970 DOA: 11/01/2020  PCP: Billey Co, MD  Patient coming from: Home  I have personally briefly reviewed patient's old medical records in [facility]  Chief Complaint: SOB, fever  HPI: [name] is a 50 y.o. male with medical history significant of TB s/p treatment, PJP PNA, HIV disease with CD4 in Sept 2021 of 99, DM2.  Pt ran out of meds this past month (thought pharmacy was supposed to mail them to him but they didn't have mailing set up and he was supposed to pick them up instead).  Pt presents to ED with c/o congestion, fevers, body aches.  Pleuritic CP that is worse with coughing.  No change in ostomy output.  No N/V.   ED Course: WBC nl, COVID neg.  Tm 101, HR 125 initially, now 101 after 1L IVF.  BP running upper 90s systolic.  Lactate 1.4  AG 12  Procal 0.41.  CTA chest: 1) segmental PE 2) atypical infiltrate / PNA, ? Atypical TB. 3) lymphadenopathy   Review of Systems: As per HPI, otherwise all review of systems negative.  Past Medical History:  Diagnosis Date   Depression    "stress and depression for any man is common" (03/21/2014)   Diabetes mellitus without complication ([name])    Dyspnea    Genital warts 01/04/2017   Hepatitis    "I don't know what hepatitis I have"   HIV disease ([name])    TB (pulmonary tuberculosis)    previously treated according to refugee documentation    Past Surgical History:  Procedure Laterality Date   FRACTURE SURGERY     IM NAILING TIBIA Right 03/21/2014   ORIF ANKLE FRACTURE Right 03/21/2014   lateral malleolus/notes 03/21/2014   ORIF ANKLE FRACTURE Right 03/21/2014   Procedure: OPEN REDUCTION INTERNAL FIXATION (ORIF) pilon ;  Surgeon: Eldred Manges, MD;  Location: [facility] OR;  Service: Orthopedics;  Laterality: Right;   TEE WITHOUT CARDIOVERSION N/A 05/23/2019   Procedure: TRANSESOPHAGEAL ECHOCARDIOGRAM (TEE);  Surgeon: Chilton Si, MD;   [facility] ENDOSCOPY;  Service: Cardiovascular;  Laterality: N/A;   TIBIA IM NAIL INSERTION Right 03/21/2014   Procedure: INTRAMEDULLARY (IM) NAIL TIBIAL;  Surgeon: Eldred Manges, MD;  Location: [facility] OR;  Service: Orthopedics;  Laterality: Right;   VIDEO BRONCHOSCOPY Bilateral 03/02/2016   Procedure: VIDEO BRONCHOSCOPY WITHOUT FLUORO;  Surgeon: Roslynn Amble, MD;  Location: [facility] ENDOSCOPY;  Service: Cardiopulmonary;  Laterality: Bilateral;     reports that he quit smoking about 19 years ago. His smoking use included cigarettes. He has a 3.00 pack-year smoking history. He has never used smokeless tobacco. He reports current alcohol use. He reports that he does not use drugs.  No Known Allergies  Family History  Problem Relation Age of Onset   Hypertension Other    Heart disease Sister      Prior to Admission medications   Medication Sig Start Date End Date Taking? Authorizing Provider  Darunavir-Cobicisctat-Emtricitabine-Tenofovir Alafenamide (SYMTUZA) 800-150-200-10 MG TABS Take 1 tablet by mouth daily with breakfast. Patient not taking: Reported on 11/01/2020 12/18/19   Judyann Munson, MD  dolutegravir (TIVICAY) 50 MG tablet Take 1 tablet (50 mg total) by mouth daily. Patient not taking: Reported on 11/01/2020 12/18/19   Judyann Munson, MD  ferrous sulfate 325 (65 FE) MG tablet Take 1 tablet (325 mg total) by mouth daily with breakfast. Patient not taking: Reported on 11/01/2020 05/27/19   Glade Lloyd,  MD  guaiFENesin (MUCINEX) 600 MG 12 hr tablet Take 1 tablet (600 mg total) by mouth 2 (two) times daily. Patient not taking: Reported on 11/01/2020 05/26/19   Glade Lloyd, MD  sitaGLIPtin-metformin (JANUMET) 50-500 MG tablet Take 1 tablet by mouth 2 (two) times daily with a meal. Patient not taking: Reported on 11/01/2020 09/06/19   Judyann Munson, MD  sulfamethoxazole-trimethoprim (BACTRIM DS) 800-160 MG tablet Take 1 tablet by mouth daily. Patient not taking: No sig reported 12/18/19    Judyann Munson, MD    Physical Exam: Vitals:   11/01/20 2126 11/01/20 2130 11/01/20 2132 11/01/20 2145  BP:  108/68  97/68  Pulse:  (!) 102 (!) 102 (!) 101  Resp:  (!) 22 (!) 21 (!) 24  Temp: 98.6 F (37 C)     TempSrc: Oral     SpO2:  (!) 89% 95% 94%  Weight:      Height:        Constitutional: Ill appearing Eyes: PERRL, lids and conjunctivae normal ENMT: Mucous membranes are moist. Posterior pharynx clear of any exudate or lesions.Normal dentition.  Neck: normal, supple, no masses, no thyromegaly Respiratory: Increased WOB Cardiovascular: Regular rate and rhythm, no murmurs / rubs / gallops. No extremity edema. 2+ pedal pulses. No carotid bruits.  Abdomen: NT, LLQ ostomy Musculoskeletal: no clubbing / cyanosis. No joint deformity upper and lower extremities. Good ROM, no contractures. Normal muscle tone.  Skin: no rashes, lesions, ulcers. No induration Neurologic: CN 2-12 grossly intact. Sensation intact, DTR normal. Strength 5/5 in all 4.  Psychiatric: Normal judgment and insight. Alert and oriented x 3. Normal mood.    Labs on Admission: I have personally reviewed following labs and imaging studies  CBC: Recent Labs  Lab 11/01/20 1656 11/01/20 1719  WBC 8.4  --   NEUTROABS 5.8  --   HGB 14.7 16.0  HCT 44.9 47.0  MCV 97.0  --   PLT 124*  --    Basic Metabolic Panel: Recent Labs  Lab 11/01/20 1656 11/01/20 1719  NA 134* 138  K 4.4 4.1  CL 99  --   CO2 23  --   GLUCOSE 171*  --   BUN 27*  --   CREATININE 1.65*  --   CALCIUM 9.2  --    GFR: Estimated Creatinine Clearance: 61.2 mL/min (A) (by C-G formula based on SCr of 1.65 mg/dL (H)). Liver Function Tests: Recent Labs  Lab 11/01/20 1656  AST 36  ALT 29  ALKPHOS 55  BILITOT 0.9  PROT 10.7*  ALBUMIN 2.5*   Recent Labs  Lab 11/01/20 1656  LIPASE 45   No results for input(s): AMMONIA in the last 168 hours. Coagulation Profile: No results for input(s): INR, PROTIME in the last 168  hours. Cardiac Enzymes: No results for input(s): CKTOTAL, CKMB, CKMBINDEX, TROPONINI in the last 168 hours. BNP (last 3 results) No results for input(s): PROBNP in the last 8760 hours. HbA1C: No results for input(s): HGBA1C in the last 72 hours. CBG: Recent Labs  Lab 11/01/20 1701  GLUCAP 186*   Lipid Profile: No results for input(s): CHOL, HDL, LDLCALC, TRIG, CHOLHDL, LDLDIRECT in the last 72 hours. Thyroid Function Tests: No results for input(s): TSH, T4TOTAL, FREET4, T3FREE, THYROIDAB in the last 72 hours. Anemia Panel: No results for input(s): VITAMINB12, FOLATE, FERRITIN, TIBC, IRON, RETICCTPCT in the last 72 hours. Urine analysis:    Component Value Date/Time   COLORURINE YELLOW 05/17/2019 1556   APPEARANCEUR CLEAR 05/17/2019 1556  LABSPEC 1.020 05/17/2019 1556   PHURINE 6.0 05/17/2019 1556   GLUCOSEU NEGATIVE 05/17/2019 1556   HGBUR NEGATIVE 05/17/2019 1556   BILIRUBINUR NEGATIVE 05/17/2019 1556   KETONESUR NEGATIVE 05/17/2019 1556   PROTEINUR 30 (A) 05/17/2019 1556   UROBILINOGEN 1.0 03/23/2014 1400   NITRITE NEGATIVE 05/17/2019 1556   LEUKOCYTESUR NEGATIVE 05/17/2019 1556    Radiological Exams on Admission: DG Chest 2 View  Result Date: 11/01/2020 CLINICAL DATA:  Cough and fever.  Shortness of breath. EXAM: CHEST - 2 VIEW COMPARISON:  05/17/2019 FINDINGS: The heart size and mediastinal contours are within normal limits. Extensive scarring in left mid and lower lung again seen. Emphysematous changes again noted with biapical pleural-parenchymal scarring. No new or worsening areas of pulmonary opacity are seen. No evidence of pleural effusion. IMPRESSION: Stable extensive left lung scarring and emphysema. No acute findings. Electronically Signed   By: Danae Orleans M.D.   On: 11/01/2020 17:57   CT Angio Chest PE W and/or Wo Contrast  Result Date: 11/01/2020 CLINICAL DATA:  Pulmonary embolus suspected. High probability. Chest pain, shortness of breath, tachycardia.  Abdominal pain, acute nonlocalized. Generalized. Fever. Concern for infection. EXAM: CT ANGIOGRAPHY CHEST CT ABDOMEN AND PELVIS WITH CONTRAST TECHNIQUE: Multidetector CT imaging of the chest was performed using the standard protocol during bolus administration of intravenous contrast. Multiplanar CT image reconstructions and MIPs were obtained to evaluate the vascular anatomy. Multidetector CT imaging of the abdomen and pelvis was performed using the standard protocol during bolus administration of intravenous contrast. CONTRAST:  OMNIPAQUE IOHEXOL 350 MG/ML SOLN COMPARISON:  CT a chest 05/17/2019 FINDINGS: CTA CHEST FINDINGS Cardiovascular: There is moderately good opacification of the central and segmental pulmonary arteries. There is overall decreased opacification of the left lower lung pulmonary arteries in comparison to the right and there is a small linear filling defect in the left lower lobe pulmonary artery. This is probably a small focal pulmonary embolus. No other emboli are identified. Normal caliber thoracic aorta without evidence of dissection. Normal heart size. No pericardial effusions. Mediastinum/Nodes: Small esophageal hiatal hernia. Esophagus is decompressed. Mediastinal and hilar lymphadenopathy with right hilar lymph nodes measuring up to 2.4 cm diameter and left pretracheal nodes measuring up to 1.5 cm diameter. Lymph nodes appear to be increasing in size since the previous study. Mild right axillary lymphadenopathy with nodes measuring 11 mm short axis dimension. Lungs/Pleura: There is evidence of chronic lung disease with emphysematous changes, apical scarring, cystic and cylindrical bronchiectasis with bronchial wall thickening and either intraluminal secretions or mucous plugging. Patchy peribronchial infiltrates could represent bronchopneumonia or chronic inflammatory process. Some of the ground-glass changes seen previously in the left lingula are improved since previous study but  there is increasing change in the left lower lung. Musculoskeletal: No chest wall abnormality. No acute or significant osseous findings. Review of the MIP images confirms the above findings. CT ABDOMEN and PELVIS FINDINGS Hepatobiliary: Mild fatty infiltration of the liver. No focal lesions. Gallbladder and bile ducts are unremarkable. Pancreas: Unremarkable. No pancreatic ductal dilatation or surrounding inflammatory changes. Spleen: Normal in size without focal abnormality. Adrenals/Urinary Tract: Adrenal glands are unremarkable. Kidneys are normal, without renal calculi, focal lesion, or hydronephrosis. Bladder is unremarkable. Stomach/Bowel: Left lower quadrant diverting colostomy. Peristomal hernia containing fat. Stomach, small bowel, and colon are not abnormally distended. Scattered stool throughout the colon. No inflammatory changes are appreciated. Appendix is not identified. Vascular/Lymphatic: No significant lymphadenopathy. Scattered aortic calcification. No aneurysm. Reproductive: Prostate is unremarkable. Other: No free air  or free fluid in the abdomen. Musculoskeletal: No acute or significant osseous findings. Review of the MIP images confirms the above findings. IMPRESSION: 1. Focal pulmonary embolus in the left lower lung pulmonary artery. 2. Chronic lung disease with emphysematous change, bronchiectasis, peribronchial thickening, and intraluminal bronchial secretions/mucus plugging. Patchy peribronchial infiltrates waxing and waning since prior study. Changes could indicate atypical pneumonia or TB. 3. Hilar and mediastinal lymphadenopathy, progressing since the previous study and probably reactive although nonspecific. 4. No acute process demonstrated in the abdomen or pelvis. 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 5. Small esophageal hiatal hernia.  Mild aortic atherosclerosis. Critical Value/emergent results were called by telephone at the time of interpretation on  11/01/2020 at 8:46 pm to [facility] , who verbally acknowledged these results. Electronically Signed   By: Burman NievesWilliam  Stevens M.D.   On: 11/01/2020 20:50   CT ABDOMEN PELVIS W CONTRAST  Result Date: 11/01/2020 CLINICAL DATA:  Pulmonary embolus suspected. High probability. Chest pain, shortness of breath, tachycardia. Abdominal pain, acute nonlocalized. Generalized. Fever. Concern for infection. EXAM: CT ANGIOGRAPHY CHEST CT ABDOMEN AND PELVIS WITH CONTRAST TECHNIQUE: Multidetector CT imaging of the chest was performed using the standard protocol during bolus administration of intravenous contrast. Multiplanar CT image reconstructions and MIPs were obtained to evaluate the vascular anatomy. Multidetector CT imaging of the abdomen and pelvis was performed using the standard protocol during bolus administration of intravenous contrast. CONTRAST:  100mL OMNIPAQUE IOHEXOL 350 MG/ML SOLN COMPARISON:  CT a chest 05/17/2019 FINDINGS: CTA CHEST FINDINGS Cardiovascular: There is moderately good opacification of the central and segmental pulmonary arteries. There is overall decreased opacification of the left lower lung pulmonary arteries in comparison to the right and there is a small linear filling defect in the left lower lobe pulmonary artery. This is probably a small focal pulmonary embolus. No other emboli are identified. Normal caliber thoracic aorta without evidence of dissection. Normal heart size. No pericardial effusions. Mediastinum/Nodes: Small esophageal hiatal hernia. Esophagus is decompressed. Mediastinal and hilar lymphadenopathy with right hilar lymph nodes measuring up to 2.4 cm diameter and left pretracheal nodes measuring up to 1.5 cm diameter. Lymph nodes appear to be increasing in size since the previous study. Mild right axillary lymphadenopathy with nodes measuring 11 mm short axis dimension. Lungs/Pleura: There is evidence of chronic lung disease with emphysematous changes, apical  scarring, cystic and cylindrical bronchiectasis with bronchial wall thickening and either intraluminal secretions or mucous plugging. Patchy peribronchial infiltrates could represent bronchopneumonia or chronic inflammatory process. Some of the ground-glass changes seen previously in the left lingula are improved since previous study but there is increasing change in the left lower lung. Musculoskeletal: No chest wall abnormality. No acute or significant osseous findings. Review of the MIP images confirms the above findings. CT ABDOMEN and PELVIS FINDINGS Hepatobiliary: Mild fatty infiltration of the liver. No focal lesions. Gallbladder and bile ducts are unremarkable. Pancreas: Unremarkable. No pancreatic ductal dilatation or surrounding inflammatory changes. Spleen: Normal in size without focal abnormality. Adrenals/Urinary Tract: Adrenal glands are unremarkable. Kidneys are normal, without renal calculi, focal lesion, or hydronephrosis. Bladder is unremarkable. Stomach/Bowel: Left lower quadrant diverting colostomy. Peristomal hernia containing fat. Stomach, small bowel, and colon are not abnormally distended. Scattered stool throughout the colon. No inflammatory changes are appreciated. Appendix is not identified. Vascular/Lymphatic: No significant lymphadenopathy. Scattered aortic calcification. No aneurysm. Reproductive: Prostate is unremarkable. Other: No free air or free fluid in the abdomen. Musculoskeletal: No acute or significant osseous findings. Review of  the MIP images confirms the above findings. IMPRESSION: 1. Focal pulmonary embolus in the left lower lung pulmonary artery. 2. Chronic lung disease with emphysematous change, bronchiectasis, peribronchial thickening, and intraluminal bronchial secretions/mucus plugging. Patchy peribronchial infiltrates waxing and waning since prior study. Changes could indicate atypical pneumonia or TB. 3. Hilar and mediastinal lymphadenopathy, progressing since the  previous study and probably reactive although nonspecific. 4. No acute process demonstrated in the abdomen or pelvis. 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 5. Small esophageal hiatal hernia.  Mild aortic atherosclerosis. Critical Value/emergent results were called by telephone at the time of interpretation on 11/01/2020 at 8:46 pm to provider [facility] , who verbally acknowledged these results. Electronically Signed   By: Burman Nieves M.D.   On: 11/01/2020 20:50    EKG: Independently reviewed.  Assessment/Plan Principal Problem:   Acute respiratory failure with hypoxia ([name]) Active Problems:   HIV (human immunodeficiency virus infection) ([name])   Acute pulmonary embolus ([name])   Type 2 diabetes mellitus ([name])   History of TB (tuberculosis)   History of Pneumocystis jirovecii pneumonia   CKD (chronic kidney disease) stage 3, GFR 30-59 ml/min ([name])   Acute pulmonary embolism ([name])    Acute resp failure with hypoxia, fever, cough, CT findings of PNA - ? Atypical TB, ? PJP (given history) EDP spoke with ID: They will consult in AM Will restart home bactrim Will leave the cefepime + vanc alone for moment Check MRSA PCR nares Airborne precautions Sputum Cx Cryptococcal antigen Cont pulse ox PE - Heparin gtt Tele monitor 2d echo DVT US BLE HIV - HIV1 RNA CD4 count Has been off of HAART therapy for past month Will leave him off for the moment pending ID consult in AM DM2 - Hold Janumet Sensitive SSI Q4H CKD3 - Creat slightly elevated from baseline today at 1.6 IVF: 1L bolus in ED and LR at 125 Repeat BMP in AM  DVT prophylaxis: Heparin GTT Code Status: Full Family Communication: No family in room Disposition Plan: Home after symptoms improved, cleared by ID Consults called: EDP spoke with ID Admission status: Admit to inpatient  Severity of Illness: The appropriate patient status for this patient is INPATIENT. Inpatient status is  judged to be reasonable and necessary in order to provide the required intensity of service to ensure the patient's safety. The patient's presenting symptoms, physical exam findings, and initial radiographic and laboratory data in the context of their chronic comorbidities is felt to place them at high risk for further clinical deterioration. Furthermore, it is not anticipated that the patient will be medically stable for discharge from the hospital within 2 midnights of admission. The following factors support the patient status of inpatient.  Patient has acute respiratory failure with hypoxia due to having a new oxygen requirement.  That is the patient has a PaO2 < 60 (pulse Ox < 90%) on room air. resp   * I certify that at the point of admission it is my clinical judgment that the patient will require inpatient hospital care spanning beyond 2 midnights from the point of admission due to high intensity of service, high risk for further deterioration and high frequency of surveillance required.*   Kashten Gowin M. DO [facility]  How to contact the [facility] Attending or Consulting provider 7A - 7P or covering provider during after hours 7P -7A, for this patient?  Check the care team in [facility] and look for a) attending/consulting [facility] provider listed and b) the [facility]  team listed Log into [email or web address]  [employer] Physician Scheduling and messaging for groups and whole hospitals  On call and physician scheduling software for group practices, residents, hospitalists and other medical providers for call, clinic, rotation and shift schedules. On[employer] is a hospital-wide system for scheduling doctors and paging doctors on call. EasyPlot is for scientific plotting and data analysis.  [email or web address]  and use [facility]'s universal password to access. If you do not have the password, please contact the hospital operator.  Locate the [facility] provider you are looking for under [facility] and page to a  number that you can be directly reached. If you still have difficulty reaching the provider, please page the [facility] (Director on Call) for the Hospitalists listed on amion for assistance.  11/01/2020, 9:49 PM

## 2020-11-01 NOTE — ED Provider Notes (Signed)
[facility] EMERGENCY DEPARTMENT Provider Note   CSN: [id number] Arrival date & time: 11/01/20  1608     History Chief Complaint  Patient presents with   Flank Pain   Cough    [name] is a 51 y.o. male.  [name] only.  Utilized Electronics engineer.  For the past week has been feeling generally unwell.  Has had congestion, fevers, body aches.  Does not have thermometer at home.  Has also been having some side pain, pain worse with coughing.  Both sides.  Currently mild.  No change in ostomy output.  No nausea or vomiting.  No chest pain.  States that he has not been taking his HIV medicine for the past month.  Last CD4 count in September 2021 was 99.  HPI     Past Medical History:  Diagnosis Date   Depression    "stress and depression for any man is common" (03/21/2014)   Diabetes mellitus without complication ([name])    Dyspnea    Genital warts 01/04/2017   Hepatitis    "I don't know what hepatitis I have"   HIV disease ([name])    TB (pulmonary tuberculosis)    previously treated according to refugee documentation    Patient Active Problem List   Diagnosis Date Noted   MSSA bacteremia    Acute respiratory failure with hypoxia ([name]) 05/17/2019   Type 2 diabetes mellitus ([name]) 05/17/2019   DKA (diabetic ketoacidoses) 11/10/2018   Genital warts 05/06/2018   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   Pulmonary emphysema ([name])    Condyloma 01/04/2017   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   Chronic systolic CHF (congestive heart failure) ([name]) 04/24/2016   AKI (acute kidney injury) ([name]) 04/24/2016   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)    Normochromic normocytic anemia 02/25/2016   Protein-calorie malnutrition, severe 02/25/2016   Renal insufficiency    S/P ORIF (open reduction internal fixation) fracture 03/21/2014   Multifocal pneumonia 09/27/2013   HIV (human  immunodeficiency virus infection) ([name]) 09/18/2013   Refugee health examination 09/18/2013    Past Surgical History:  Procedure Laterality Date   FRACTURE SURGERY     IM NAILING TIBIA Right 03/21/2014   ORIF ANKLE FRACTURE Right 03/21/2014   lateral malleolus/notes 03/21/2014   ORIF ANKLE FRACTURE Right 03/21/2014   Procedure: OPEN REDUCTION INTERNAL FIXATION (ORIF) pilon ;  Surgeon: Eldred Manges, MD;  Location: [facility] OR;  Service: Orthopedics;  Laterality: Right;   TEE WITHOUT CARDIOVERSION N/A 05/23/2019   Procedure: TRANSESOPHAGEAL ECHOCARDIOGRAM (TEE);  Surgeon: Chilton Si, MD;  Location: [facility] ENDOSCOPY;  Service: Cardiovascular;  Laterality: N/A;   TIBIA IM NAIL INSERTION Right 03/21/2014   Procedure: INTRAMEDULLARY (IM) NAIL TIBIAL;  Surgeon: Eldred Manges, MD;  Location: [facility] OR;  Service: Orthopedics;  Laterality: Right;   VIDEO BRONCHOSCOPY Bilateral 03/02/2016   Procedure: VIDEO BRONCHOSCOPY WITHOUT FLUORO;  Surgeon: Roslynn Amble, MD;  Location: [facility] ENDOSCOPY;  Service: Cardiopulmonary;  Laterality: Bilateral;       Family History  Problem Relation Age of Onset   Hypertension Other    Heart disease Sister     Social History   Tobacco Use   Smoking status: Former    Packs/day: 0.50    Years: 6.00    Pack years: 3.00    Types: Cigarettes    Quit date: 03/16/2001    Years since quitting: 19.6   Smokeless tobacco: Never  Tobacco comments:    "quit smoking ~ 2003"  Vaping Use   Vaping Use: Never used  Substance Use Topics   Alcohol use: Yes    Comment: drinks bottled beer intermittently   Drug use: No    Home Medications Prior to Admission medications   Medication Sig Start Date End Date Taking? Authorizing Provider  Darunavir-Cobicisctat-Emtricitabine-Tenofovir Alafenamide (SYMTUZA) 800-150-200-10 MG TABS Take 1 tablet by mouth daily with breakfast. 12/18/19   Judyann Munson, MD  dolutegravir (TIVICAY) 50 MG tablet Take 1 tablet (50 mg total) by mouth daily. 12/18/19    Judyann Munson, MD  ferrous sulfate 325 (65 FE) MG tablet Take 1 tablet (325 mg total) by mouth daily with breakfast. 05/27/19   Glade Lloyd, MD  guaiFENesin (MUCINEX) 600 MG 12 hr tablet Take 1 tablet (600 mg total) by mouth 2 (two) times daily. 05/26/19   Glade Lloyd, MD  sitaGLIPtin-metformin (JANUMET) 50-500 MG tablet Take 1 tablet by mouth 2 (two) times daily with a meal. 09/06/19   Judyann Munson, MD  sulfamethoxazole-trimethoprim (BACTRIM DS) 800-160 MG tablet Take 1 tablet by mouth daily. 12/18/19   Judyann Munson, MD    Allergies    Patient has no known allergies.  Review of Systems   Review of Systems  Constitutional:  Positive for chills, fatigue and fever.  HENT:  Negative for ear pain and sore throat.   Eyes:  Negative for pain and visual disturbance.  Respiratory:  Negative for cough and shortness of breath.   Cardiovascular:  Negative for chest pain and palpitations.  Gastrointestinal:  Positive for abdominal pain. Negative for vomiting.  Genitourinary:  Negative for dysuria and hematuria.  Musculoskeletal:  Negative for arthralgias and back pain.  Skin:  Negative for color change and rash.  Neurological:  Negative for seizures and syncope.  All other systems reviewed and are negative.  Physical Exam Updated Vital Signs BP 94/62   Pulse (!) 103   Temp 99 F (37.2 C) (Oral)   Resp (!) 23   Ht 6\' 1"  (1.854 m)   Wt 81.6 kg   SpO2 95%   BMI 23.75 kg/m   Physical Exam Vitals and nursing note reviewed.  Constitutional:      Appearance: He is well-developed.  HENT:     Head: Normocephalic and atraumatic.  Eyes:     Conjunctiva/sclera: Conjunctivae normal.  Cardiovascular:     Rate and Rhythm: Normal rate and regular rhythm.     Heart sounds: No murmur heard. Pulmonary:     Effort: Pulmonary effort is normal. No respiratory distress.     Breath sounds: Normal breath sounds.  Abdominal:     Palpations: Abdomen is soft.     Tenderness: There is no  abdominal tenderness.     Comments: Ostomy bag intact; there is generalized TTP, no rebound or guarding  Musculoskeletal:     Cervical back: Neck supple.  Skin:    General: Skin is warm and dry.  Neurological:     General: No focal deficit present.     Mental Status: He is alert.  Psychiatric:        Mood and Affect: Mood normal.    ED Results / Procedures / Treatments   Labs (all labs ordered are listed, but only abnormal results are displayed) Labs Reviewed  CBC WITH DIFFERENTIAL/PLATELET - Abnormal; Notable for the following components:      Result Value   Platelets 124 (*)    All other components within normal limits  COMPREHENSIVE METABOLIC PANEL - Abnormal; Notable for the following components:   Sodium 134 (*)    Glucose, Bld 171 (*)    BUN 27 (*)    Creatinine, Ser 1.65 (*)    Total Protein 10.7 (*)    Albumin 2.5 (*)    GFR, Estimated 51 (*)    All other components within normal limits  CBG MONITORING, ED - Abnormal; Notable for the following components:   Glucose-Capillary 186 (*)    All other components within normal limits  I-STAT VENOUS BLOOD GAS, ED - Abnormal; Notable for the following components:   pCO2, Ven 41.4 (*)    pO2, Ven 72.0 (*)    Calcium, Ion 1.14 (*)    All other components within normal limits  TROPONIN I (HIGH SENSITIVITY) - Abnormal; Notable for the following components:   Troponin I (High Sensitivity) 24 (*)    All other components within normal limits  RESP PANEL BY RT-PCR (FLU A&B, COVID) ARPGX2  CULTURE, BLOOD (ROUTINE X 2)  CULTURE, BLOOD (ROUTINE X 2)  EXPECTORATED SPUTUM ASSESSMENT W GRAM STAIN, RFLX TO RESP C  LIPASE, BLOOD  LACTIC ACID, PLASMA  LACTIC ACID, PLASMA  URINALYSIS, ROUTINE W REFLEX MICROSCOPIC  T-HELPER CELLS (CD4) COUNT (NOT AT [facility])  PROCALCITONIN  PROCALCITONIN  CRYPTOCOCCAL ANTIGEN  HIV-1 RNA QUANT-NO REFLEX-BLD    EKG EKG Interpretation  Date/Time:  Friday November 01 2020 16:58:05 EDT Ventricular Rate:   128 PR Interval:  140 QRS Duration: 84 QT Interval:  300 QTC Calculation: 438 R Axis:   175 Text Interpretation:  Suspect arm lead reversal, interpretation assumes no reversal Sinus tachycardia Right atrial enlargement Right axis deviation Pulmonary disease pattern Abnormal ECG Confirmed by Marianna Fuss ([id number]) on 11/01/2020 9:09:54 PM Radiology DG Chest 2 View  Result Date: 11/01/2020 CLINICAL DATA:  Cough and fever.  Shortness of breath. EXAM: CHEST - 2 VIEW COMPARISON:  05/17/2019 FINDINGS: The heart size and mediastinal contours are within normal limits. Extensive scarring in left mid and lower lung again seen. Emphysematous changes again noted with biapical pleural-parenchymal scarring. No new or worsening areas of pulmonary opacity are seen. No evidence of pleural effusion. IMPRESSION: Stable extensive left lung scarring and emphysema. No acute findings. Electronically Signed   By: Danae Orleans M.D.   On: 11/01/2020 17:57   CT Angio Chest PE W and/or Wo Contrast  Result Date: 11/01/2020 CLINICAL DATA:  Pulmonary embolus suspected. High probability. Chest pain, shortness of breath, tachycardia. Abdominal pain, acute nonlocalized. Generalized. Fever. Concern for infection. EXAM: CT ANGIOGRAPHY CHEST CT ABDOMEN AND PELVIS WITH CONTRAST TECHNIQUE: Multidetector CT imaging of the chest was performed using the standard protocol during bolus administration of intravenous contrast. Multiplanar CT image reconstructions and MIPs were obtained to evaluate the vascular anatomy. Multidetector CT imaging of the abdomen and pelvis was performed using the standard protocol during bolus administration of intravenous contrast. CONTRAST:  OMNIPAQUE IOHEXOL 350 MG/ML SOLN COMPARISON:  CT a chest 05/17/2019 FINDINGS: CTA CHEST FINDINGS Cardiovascular: There is moderately good opacification of the central and segmental pulmonary arteries. There is overall decreased opacification of the left lower lung  pulmonary arteries in comparison to the right and there is a small linear filling defect in the left lower lobe pulmonary artery. This is probably a small focal pulmonary embolus. No other emboli are identified. Normal caliber thoracic aorta without evidence of dissection. Normal heart size. No pericardial effusions. Mediastinum/Nodes: Small esophageal hiatal hernia. Esophagus is decompressed. Mediastinal and hilar lymphadenopathy with  right hilar lymph nodes measuring up to 2.4 cm diameter and left pretracheal nodes measuring up to 1.5 cm diameter. Lymph nodes appear to be increasing in size since the previous study. Mild right axillary lymphadenopathy with nodes measuring 11 mm short axis dimension. Lungs/Pleura: There is evidence of chronic lung disease with emphysematous changes, apical scarring, cystic and cylindrical bronchiectasis with bronchial wall thickening and either intraluminal secretions or mucous plugging. Patchy peribronchial infiltrates could represent bronchopneumonia or chronic inflammatory process. Some of the ground-glass changes seen previously in the left lingula are improved since previous study but there is increasing change in the left lower lung. Musculoskeletal: No chest wall abnormality. No acute or significant osseous findings. Review of the MIP images confirms the above findings. CT ABDOMEN and PELVIS FINDINGS Hepatobiliary: Mild fatty infiltration of the liver. No focal lesions. Gallbladder and bile ducts are unremarkable. Pancreas: Unremarkable. No pancreatic ductal dilatation or surrounding inflammatory changes. Spleen: Normal in size without focal abnormality. Adrenals/Urinary Tract: Adrenal glands are unremarkable. Kidneys are normal, without renal calculi, focal lesion, or hydronephrosis. Bladder is unremarkable. Stomach/Bowel: Left lower quadrant diverting colostomy. Peristomal hernia containing fat. Stomach, small bowel, and colon are not abnormally distended. Scattered stool  throughout the colon. No inflammatory changes are appreciated. Appendix is not identified. Vascular/Lymphatic: No significant lymphadenopathy. Scattered aortic calcification. No aneurysm. Reproductive: Prostate is unremarkable. Other: No free air or free fluid in the abdomen. Musculoskeletal: No acute or significant osseous findings. Review of the MIP images confirms the above findings. IMPRESSION: 1. Focal pulmonary embolus in the left lower lung pulmonary artery. 2. Chronic lung disease with emphysematous change, bronchiectasis, peribronchial thickening, and intraluminal bronchial secretions/mucus plugging. Patchy peribronchial infiltrates waxing and waning since prior study. Changes could indicate atypical pneumonia or TB. 3. Hilar and mediastinal lymphadenopathy, progressing since the previous study and probably reactive although nonspecific. 4. No acute process demonstrated in the abdomen or pelvis. 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 5. Small esophageal hiatal hernia.  Mild aortic atherosclerosis. Critical Value/emergent results were called by telephone at the time of interpretation on 11/01/2020 at 8:46 pm to provider [facility] , who verbally acknowledged these results. Electronically Signed   By: Burman Nieves M.D.   On: 11/01/2020 20:50   CT ABDOMEN PELVIS W CONTRAST  Result Date: 11/01/2020 CLINICAL DATA:  Pulmonary embolus suspected. High probability. Chest pain, shortness of breath, tachycardia. Abdominal pain, acute nonlocalized. Generalized. Fever. Concern for infection. EXAM: CT ANGIOGRAPHY CHEST CT ABDOMEN AND PELVIS WITH CONTRAST TECHNIQUE: Multidetector CT imaging of the chest was performed using the standard protocol during bolus administration of intravenous contrast. Multiplanar CT image reconstructions and MIPs were obtained to evaluate the vascular anatomy. Multidetector CT imaging of the abdomen and pelvis was performed using the standard protocol  during bolus administration of intravenous contrast. CONTRAST:  OMNIPAQUE IOHEXOL 350 MG/ML SOLN COMPARISON:  CT a chest 05/17/2019 FINDINGS: CTA CHEST FINDINGS Cardiovascular: There is moderately good opacification of the central and segmental pulmonary arteries. There is overall decreased opacification of the left lower lung pulmonary arteries in comparison to the right and there is a small linear filling defect in the left lower lobe pulmonary artery. This is probably a small focal pulmonary embolus. No other emboli are identified. Normal caliber thoracic aorta without evidence of dissection. Normal heart size. No pericardial effusions. Mediastinum/Nodes: Small esophageal hiatal hernia. Esophagus is decompressed. Mediastinal and hilar lymphadenopathy with right hilar lymph nodes measuring up to 2.4 cm diameter and left pretracheal nodes measuring  up to 1.5 cm diameter. Lymph nodes appear to be increasing in size since the previous study. Mild right axillary lymphadenopathy with nodes measuring 11 mm short axis dimension. Lungs/Pleura: There is evidence of chronic lung disease with emphysematous changes, apical scarring, cystic and cylindrical bronchiectasis with bronchial wall thickening and either intraluminal secretions or mucous plugging. Patchy peribronchial infiltrates could represent bronchopneumonia or chronic inflammatory process. Some of the ground-glass changes seen previously in the left lingula are improved since previous study but there is increasing change in the left lower lung. Musculoskeletal: No chest wall abnormality. No acute or significant osseous findings. Review of the MIP images confirms the above findings. CT ABDOMEN and PELVIS FINDINGS Hepatobiliary: Mild fatty infiltration of the liver. No focal lesions. Gallbladder and bile ducts are unremarkable. Pancreas: Unremarkable. No pancreatic ductal dilatation or surrounding inflammatory changes. Spleen: Normal in size without focal  abnormality. Adrenals/Urinary Tract: Adrenal glands are unremarkable. Kidneys are normal, without renal calculi, focal lesion, or hydronephrosis. Bladder is unremarkable. Stomach/Bowel: Left lower quadrant diverting colostomy. Peristomal hernia containing fat. Stomach, small bowel, and colon are not abnormally distended. Scattered stool throughout the colon. No inflammatory changes are appreciated. Appendix is not identified. Vascular/Lymphatic: No significant lymphadenopathy. Scattered aortic calcification. No aneurysm. Reproductive: Prostate is unremarkable. Other: No free air or free fluid in the abdomen. Musculoskeletal: No acute or significant osseous findings. Review of the MIP images confirms the above findings. IMPRESSION: 1. Focal pulmonary embolus in the left lower lung pulmonary artery. 2. Chronic lung disease with emphysematous change, bronchiectasis, peribronchial thickening, and intraluminal bronchial secretions/mucus plugging. Patchy peribronchial infiltrates waxing and waning since prior study. Changes could indicate atypical pneumonia or TB. 3. Hilar and mediastinal lymphadenopathy, progressing since the previous study and probably reactive although nonspecific. 4. No acute process demonstrated in the abdomen or pelvis. 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 5. Small esophageal hiatal hernia.  Mild aortic atherosclerosis. Critical Value/emergent results were called by telephone at the time of interpretation on 11/01/2020 at 8:46 pm to provider [facility] , who verbally acknowledged these results. Electronically Signed   By: Burman Nieves M.D.   On: 11/01/2020 20:50    Procedures .Critical Care  Date/Time: 11/01/2020 9:08 PM Performed by: Milagros Loll, MD Authorized by: Milagros Loll, MD   Critical care provider statement:    Critical care time (minutes):  43   Critical care was time spent personally by me on the following activities:   Discussions with consultants, evaluation of patient's response to treatment, examination of patient, ordering and performing treatments and interventions, ordering and review of laboratory studies, ordering and review of radiographic studies, pulse oximetry, re-evaluation of patient's condition, obtaining history from patient or surrogate and review of old charts   Medications Ordered in ED Medications  metroNIDAZOLE (FLAGYL) IVPB 500 mg (500 mg Intravenous New Bag/Given 11/01/20 2028)  vancomycin (VANCOREADY) IVPB 1750 mg/350 mL (has no administration in time range)  ceFEPIme (MAXIPIME) 2 g in sodium chloride 0.9 % 100 mL IVPB (has no administration in time range)  vancomycin (VANCOCIN) IVPB 1000 mg/200 mL premix (has no administration in time range)  acetaminophen (TYLENOL) tablet 650 mg (650 mg Oral Given 11/01/20 1659)  lactated ringers bolus 1,000 mL (1,000 mLs Intravenous New Bag/Given 11/01/20 1934)  ceFEPIme (MAXIPIME) 2 g in sodium chloride 0.9 % 100 mL IVPB (0 g Intravenous Stopped 11/01/20 2023)  iohexol (OMNIPAQUE) 350 MG/ML injection 100 mL (100 mLs Intravenous Contrast Given 11/01/20 2013)    ED  Course  I have reviewed the triage vital signs and the nursing notes.  Pertinent labs & imaging results that were available during my care of the patient were reviewed by me and considered in my medical decision making (see chart for details).    MDM Rules/Calculators/A&P                           50 year old male with history of HIV presents to ER with multiple complaints including chills and fever.  On assessment, patient did not appear to be in any acute distress however he was noted to have initial fever with mild tachycardia.  Placed broad infectious work-up and ordered broad-spectrum antibiotics, particularly given his most recent CD4 count was quite low.  Given patient had complained of some side pain worse with cough and was mildly hypoxic, checked PE study.  Given his some abdominal  pain on exam, checked CT abdomen.  CT abdomen pelvis negative.  Basic labs stable.  CT chest however did demonstrate a small acute pulmonary embolus.  Also patient had infiltrate to suggest atypical infection versus TB.  Discussed the case with Dr. Renold DonVu with infectious disease.  Their team will consult on patient in the morning.  For now he recommends continuing these antibiotics, following blood cultures, adding on sputum culture, HIV viral load, CD4 count, cryptococcal antigen.  Have ordered heparin for PE.  Will consult medicine for admission.  Final Clinical Impression(s) / ED Diagnoses Final diagnoses:  Acute pulmonary embolism without acute cor pulmonale, unspecified pulmonary embolism type ([name])  Community acquired pneumonia, unspecified laterality  Immunocompromised ([name])  HIV infection, unspecified symptom status ([name])    Rx / DC Orders ED Discharge Orders     None        Milagros Lollykstra, Adamariz Gillott S, MD 11/01/20 2110

## 2020-11-01 NOTE — ED Notes (Signed)
Lab to add on A1c 

## 2020-11-01 NOTE — ED Notes (Signed)
Spoke with Clydie Braun at pharmacy - told to mark one insulin as a duplicate dose and give the other

## 2020-11-01 NOTE — Progress Notes (Signed)
ANTICOAGULATION CONSULT NOTE - Initial Consult  Pharmacy Consult for heparin Indication: pulmonary embolus  No Known Allergies  Patient Measurements: Height: 6\' 1"  (185.4 cm) Weight: 81.6 kg (180 lb) IBW/kg (Calculated) : 79.9 Heparin Dosing Weight: 81.6 kg   Vital Signs: Temp: 99 F (37.2 C) (08/19 1808) Temp Source: Oral (08/19 1808) BP: 107/61 (08/19 2100) Pulse Rate: 101 (08/19 2100)  Labs: Recent Labs    11/01/20 1656 11/01/20 1719 11/01/20 1926  HGB 14.7 16.0  --   HCT 44.9 47.0  --   PLT 124*  --   --   CREATININE 1.65*  --   --   TROPONINIHS  --   --  24*    Estimated Creatinine Clearance: 61.2 mL/min (A) (by C-G formula based on SCr of 1.65 mg/dL (H)).   Medical History: Past Medical History:  Diagnosis Date   Depression    "stress and depression for any man is common" (03/21/2014)   Diabetes mellitus without complication ([name])    Dyspnea    Genital warts 01/04/2017   Hepatitis    "I don't know what hepatitis I have"   HIV disease ([name])    TB (pulmonary tuberculosis)    previously treated according to refugee documentation    Medications:  (Not in a hospital admission)   Assessment: 33 YOM found to have an acute PE in the left lower pulmonary artery. Pharmacy consulted to start IV heparin.   H/H wnl, Plw low. SCr elevated   Goal of Therapy:  Heparin level 0.3-0.7 units/ml Monitor platelets by anticoagulation protocol: Yes   Plan:  -Heparin 4500 units IV bolus followed by heparin infusion at 1350 units/hr -F/u HL in AM -Monitor daily HL, CBC and s/s of bleeding   55, PharmD., BCPS, BCCCP Clinical Pharmacist Please refer to [facility] for unit-specific pharmacist

## 2020-11-01 NOTE — ED Notes (Signed)
Urine culture sent down with urine specimen  

## 2020-11-01 NOTE — ED Notes (Signed)
Dr. Gardner at bedside 

## 2020-11-01 NOTE — Progress Notes (Signed)
Pharmacy Antibiotic Note  [name] is a 50 y.o. male admitted on 11/01/2020 with sepsis.  Pharmacy has been consulted for vancomycin and cefepime dosing. Scr is 1.65 on admission and baseline is 1.2. Vancomycin variable dosing ordered d/t unstable renal function and kept cefepime at higher dose since he is >60, however, he is borderline for a potential decrease in dose. Pt also presented w/ fever, soft BP, tachycardia, and congestion.  Plan: Cefepime 2 g IV q8h Vancomycin 1750 mg IV x1 Vancomycin 1000 mg IV q12h Metronidazole 500 mg IV q8h Monitor renal function, clinical status, and abx plan. Order vanc levels as clinically appropriate.  Height: 6\' 1"  (185.4 cm) Weight: 81.6 kg (180 lb) IBW/kg (Calculated) : 79.9  Temp (24hrs), Avg:100 F (37.8 C), Min:99 F (37.2 C), Max:101 F (38.3 C)  Recent Labs  Lab 11/01/20 1656  WBC 8.4  CREATININE 1.65*  LATICACIDVEN 1.4    Estimated Creatinine Clearance: 61.2 mL/min (A) (by C-G formula based on SCr of 1.65 mg/dL (H)).    No Known Allergies  Antimicrobials this admission: Vancomycin 8/19 >>  Cefepime 8/19 >>  Metronidazole 8/19 >>  Dose adjustments this admission: None.  Microbiology results: 8/19 BCx: collected 8/19 UA: ordered    Thank you for allowing pharmacy to be a part of this patient's care.  9/19, PharmD PGY1 Pharmacy Resident  Please check AMION for all [facility] pharmacy phone numbers After 10:00 PM call main pharmacy [phone]

## 2020-11-01 NOTE — ED Triage Notes (Signed)
Pt here from home for cough that started 4 days ago, and R flank pain and back pain that started 3 days ago. Pt denies N/V, endorses decrease in appetite. Pt has ostomy bag, speaks swahili.

## 2020-11-01 NOTE — ED Provider Notes (Signed)
Emergency Medicine Provider Triage Evaluation Note  [name] , a 50 y.o. male  was evaluated in triage.  Pt complains of pain in both sides of his, fevers, chills, congestion, cough, shortness of breath, fatigue. Patient speaks [name] that his symptoms have been ongoing for 1 week.  Review of Systems  Positive: Fever, cough, congestion, myalgias Negative: Vomiting  Physical Exam  BP 97/78 (BP Location: Left Arm)   Pulse (!) 125   Temp (!) 101 F (38.3 C)   Resp 18   Ht 6\' 1"  (1.854 m)   Wt 81.6 kg   SpO2 91%   BMI 23.75 kg/m  Gen:   Awake, no distress   Resp:  Normal effort  MSK:   Moves extremities without difficulty  Other:  Lung sounds are coarse diffusely.  No wheezing noted.  Patient skin is warm to touch.  Tachycardia is present.  SPO2 low end of normal but appears well perfused.  Medical Decision Making  Medically screening exam initiated at 4:56 PM.  Appropriate orders placed.  [name] P Faucett was informed that the remainder of the evaluation will be completed by another provider, this initial triage assessment does not replace that evaluation, and the importance of remaining in the ED until their evaluation is complete.  The patient appears ill.  Discussed with RN that patient should be placed in major care expediently.   Aretha Parrot, Gailen Shelter 11/01/20 1658    11/03/20, MD 11/01/20 1723

## 2020-11-01 NOTE — ED Notes (Signed)
Pt in CT.

## 2020-11-02 ENCOUNTER — Other Ambulatory Visit (HOSPITAL_COMMUNITY): Payer: BC Managed Care – PPO

## 2020-11-02 ENCOUNTER — Inpatient Hospital Stay (HOSPITAL_COMMUNITY): Payer: BC Managed Care – PPO

## 2020-11-02 DIAGNOSIS — B20 Human immunodeficiency virus [HIV] disease: Principal | ICD-10-CM

## 2020-11-02 DIAGNOSIS — Z8611 Personal history of tuberculosis: Secondary | ICD-10-CM

## 2020-11-02 DIAGNOSIS — J189 Pneumonia, unspecified organism: Secondary | ICD-10-CM

## 2020-11-02 DIAGNOSIS — I2602 Saddle embolus of pulmonary artery with acute cor pulmonale: Secondary | ICD-10-CM | POA: Diagnosis not present

## 2020-11-02 DIAGNOSIS — I2699 Other pulmonary embolism without acute cor pulmonale: Secondary | ICD-10-CM

## 2020-11-02 LAB — ECHOCARDIOGRAM COMPLETE
AR max vel: 2.1 cm2
AV Area VTI: 2.05 cm2
AV Area mean vel: 2 cm2
AV Mean grad: 6 mmHg
AV Peak grad: 9.6 mmHg
Ao pk vel: 1.55 m/s
Area-P 1/2: 4.36 cm2
Calc EF: 48 %
Height: 73 in
S' Lateral: 3.6 cm
Single Plane A2C EF: 51.6 %
Single Plane A4C EF: 50.5 %
Weight: 2880 oz

## 2020-11-02 LAB — GLUCOSE, CAPILLARY
Glucose-Capillary: 126 mg/dL — ABNORMAL HIGH (ref 70–99)
Glucose-Capillary: 134 mg/dL — ABNORMAL HIGH (ref 70–99)
Glucose-Capillary: 140 mg/dL — ABNORMAL HIGH (ref 70–99)
Glucose-Capillary: 85 mg/dL (ref 70–99)
Glucose-Capillary: 91 mg/dL (ref 70–99)

## 2020-11-02 LAB — BASIC METABOLIC PANEL
Anion gap: 7 (ref 5–15)
BUN: 24 mg/dL — ABNORMAL HIGH (ref 6–20)
CO2: 22 mmol/L (ref 22–32)
Calcium: 8.4 mg/dL — ABNORMAL LOW (ref 8.9–10.3)
Chloride: 102 mmol/L (ref 98–111)
Creatinine, Ser: 1.52 mg/dL — ABNORMAL HIGH (ref 0.61–1.24)
GFR, Estimated: 56 mL/min — ABNORMAL LOW (ref >60)
Glucose, Bld: 98 mg/dL (ref 70–99)
Potassium: 3.5 mmol/L (ref 3.5–5.1)
Sodium: 131 mmol/L — ABNORMAL LOW (ref 135–145)

## 2020-11-02 LAB — LACTATE DEHYDROGENASE: LDH: 111 U/L (ref 98–192)

## 2020-11-02 LAB — CBC
HCT: 39 % (ref 39.0–52.0)
Hemoglobin: 12.8 g/dL — ABNORMAL LOW (ref 13.0–17.0)
MCH: 31.7 pg (ref 26.0–34.0)
MCHC: 32.8 g/dL (ref 30.0–36.0)
MCV: 96.5 fL (ref 80.0–100.0)
Platelets: 162 10*3/uL (ref 150–400)
RBC: 4.04 MIL/uL — ABNORMAL LOW (ref 4.22–5.81)
RDW: 14.9 % (ref 11.5–15.5)
WBC: 7.9 10*3/uL (ref 4.0–10.5)
nRBC: 0 % (ref 0.0–0.2)

## 2020-11-02 LAB — TROPONIN I (HIGH SENSITIVITY): Troponin I (High Sensitivity): 12 ng/L (ref <18)

## 2020-11-02 LAB — CBG MONITORING, ED: Glucose-Capillary: 92 mg/dL (ref 70–99)

## 2020-11-02 LAB — EXPECTORATED SPUTUM ASSESSMENT W GRAM STAIN, RFLX TO RESP C

## 2020-11-02 LAB — PROTEIN / CREATININE RATIO, URINE
Creatinine, Urine: 63.71 mg/dL
Protein Creatinine Ratio: 1.32 mg/mg{Cre} — ABNORMAL HIGH (ref 0.00–0.15)
Total Protein, Urine: 84 mg/dL

## 2020-11-02 LAB — HEPARIN LEVEL (UNFRACTIONATED)
Heparin Unfractionated: 0.15 IU/mL — ABNORMAL LOW (ref 0.30–0.70)
Heparin Unfractionated: 0.26 IU/mL — ABNORMAL LOW (ref 0.30–0.70)
Heparin Unfractionated: 0.29 IU/mL — ABNORMAL LOW (ref 0.30–0.70)

## 2020-11-02 LAB — MRSA NEXT GEN BY PCR, NASAL: MRSA by PCR Next Gen: NOT DETECTED

## 2020-11-02 LAB — PROCALCITONIN: Procalcitonin: 0.3 ng/mL

## 2020-11-02 LAB — STREP PNEUMONIAE URINARY ANTIGEN: Strep Pneumo Urinary Antigen: NEGATIVE

## 2020-11-02 MED ORDER — MORPHINE SULFATE (PF) 2 MG/ML IV SOLN
2.0000 mg | INTRAVENOUS | Status: DC | PRN
  Administered 2020-11-02: 2 mg via INTRAVENOUS
  Filled 2020-11-02: qty 1

## 2020-11-02 MED ORDER — DOLUTEGRAVIR SODIUM 50 MG PO TABS
50.0000 mg | ORAL_TABLET | Freq: Every day | ORAL | Status: DC
  Administered 2020-11-02 – 2020-11-05 (×4): 50 mg via ORAL
  Filled 2020-11-02 (×4): qty 1

## 2020-11-02 MED ORDER — AZITHROMYCIN 500 MG PO TABS
500.0000 mg | ORAL_TABLET | Freq: Every day | ORAL | Status: DC
  Administered 2020-11-02 – 2020-11-05 (×4): 500 mg via ORAL
  Filled 2020-11-02 (×4): qty 1

## 2020-11-02 MED ORDER — DARUN-COBIC-EMTRICIT-TENOFAF 800-150-200-10 MG PO TABS
1.0000 | ORAL_TABLET | Freq: Every day | ORAL | Status: DC
  Administered 2020-11-03 – 2020-11-05 (×3): 1 via ORAL
  Filled 2020-11-02 (×4): qty 1

## 2020-11-02 MED ORDER — HEPARIN BOLUS VIA INFUSION
2500.0000 [IU] | Freq: Once | INTRAVENOUS | Status: AC
  Administered 2020-11-02: 2500 [IU] via INTRAVENOUS
  Filled 2020-11-02: qty 2500

## 2020-11-02 MED ORDER — SODIUM CHLORIDE 0.9 % IV SOLN
2.0000 g | INTRAVENOUS | Status: DC
  Administered 2020-11-02 – 2020-11-04 (×3): 2 g via INTRAVENOUS
  Filled 2020-11-02 (×4): qty 20

## 2020-11-02 NOTE — Progress Notes (Signed)
ANTICOAGULATION CONSULT NOTE - Follow Up Consult  Pharmacy Consult for heparin Indication: pulmonary embolus  No Known Allergies  Patient Measurements: Height: 6\' 1"  (185.4 cm) Weight: 81.6 kg (180 lb) IBW/kg (Calculated) : 79.9 Heparin Dosing Weight: 81.6 kg  Vital Signs: Temp: 98.9 F (37.2 C) (08/20 1200) Temp Source: Oral (08/20 1200) BP: 107/67 (08/20 1200) Pulse Rate: 97 (08/20 1200)  Labs: Recent Labs    11/01/20 1656 11/01/20 1719 11/01/20 1926 11/02/20 0221 11/02/20 0500 11/02/20 1259  HGB 14.7 16.0  --  12.8*  --   --   HCT 44.9 47.0  --  39.0  --   --   PLT 124*  --   --  162  --   --   HEPARINUNFRC  --   --   --   --  0.15* 0.26*  CREATININE 1.65*  --   --  1.52*  --   --   TROPONINIHS  --   --  24* 12  --   --     Estimated Creatinine Clearance: 66.4 mL/min (A) (by C-G formula based on SCr of 1.52 mg/dL (H)).   Assessment: 34 YOM found to have an acute PE in the left lower pulmonary artery. Pharmacy consulted to dose IV heparin. He was initially subtherapeutic (heparin level 0.15) on heparin 1350 units/hr so he was given a rebolus of 2500 units and increased to heparin 1650 units/hr.  Most recent heparin level is subtherapeutic at 0.26, so it is appropriate to increase the rate. No issues with line or bleeding reported per RN. CBC is stable.  Goal of Therapy:  Heparin level 0.3-0.7 units/ml Monitor platelets by anticoagulation protocol: Yes   Plan:  Increase heparin drip to 1800 units/hr Check a 6-hour heparin level Monitor daily heparin level, CBC and s/s of bleeding   Thank you for including pharmacy in the care of this patient.  55, PharmD PGY1 Acute Care Pharmacy Resident  Phone: [phone] 11/02/2020  3:00 PM  Please check [email or web address] for unit-specific pharmacy phone numbers.

## 2020-11-02 NOTE — ED Notes (Signed)
This RN attempted to help pt empty foley, pt does not want to empty at this time

## 2020-11-02 NOTE — ED Notes (Signed)
Report attempted x 1

## 2020-11-02 NOTE — ED Notes (Signed)
Report given to Lupita Leash RN - asked to bring pt up after 7

## 2020-11-02 NOTE — Progress Notes (Signed)
ANTICOAGULATION CONSULT NOTE - Follow Up Consult  Pharmacy Consult for heparin Indication: pulmonary embolus  No Known Allergies  Patient Measurements: Height: 6\' 1"  (185.4 cm) Weight: 81.6 kg (180 lb) IBW/kg (Calculated) : 79.9 Heparin Dosing Weight: 81.6 kg  Vital Signs: Temp: 98.7 F (37.1 C) (08/20 2100) Temp Source: Oral (08/20 2100) BP: 111/80 (08/20 2100) Pulse Rate: 92 (08/20 2100)  Labs: Recent Labs    11/01/20 1656 11/01/20 1719 11/01/20 1926 11/02/20 0221 11/02/20 0500 11/02/20 1259 11/02/20 2040  HGB 14.7 16.0  --  12.8*  --   --   --   HCT 44.9 47.0  --  39.0  --   --   --   PLT 124*  --   --  162  --   --   --   HEPARINUNFRC  --   --   --   --  0.15* 0.26* 0.29*  CREATININE 1.65*  --   --  1.52*  --   --   --   TROPONINIHS  --   --  24* 12  --   --   --      Estimated Creatinine Clearance: 66.4 mL/min (A) (by C-G formula based on SCr of 1.52 mg/dL (H)).   Assessment: 85 YOM found to have an acute PE in the left lower pulmonary artery. Pharmacy consulted to dose IV heparin.   Most recent heparin level is subtherapeutic at 0.29 on 1800 units/hr of IV heparin. No issues with line or bleeding reported per RN.  Goal of Therapy:  Heparin level 0.3-0.7 units/ml Monitor platelets by anticoagulation protocol: Yes   Plan:  Increase heparin drip to 2000 units/hr Check a 6-hour heparin level Monitor daily heparin level, CBC and s/s of bleeding   Thank you for including pharmacy in the care of this patient.  55, PharmD., BCPS, BCCCP Clinical Pharmacist Please refer to [facility] for unit-specific pharmacist

## 2020-11-02 NOTE — ED Notes (Signed)
Dr. Julian Reil notified of pt temp and pain. Per Dr. Julian Reil go ahead and give tylenol and orders to be put in

## 2020-11-02 NOTE — Progress Notes (Addendum)
PROGRESS NOTE  [name]:[id number] DOB: [birth year] DOA: 11/01/2020 PCP: Lenoria Chime, MD   LOS: 1 day   Brief narrative:  J patient is a 50 years old male with past medical history of tuberculosis status posttreatment, PJP pneumonia, HIV disease with CD4 count of 99 in September 2021, diabetes mellitus type 2 ran out of his medications for the month after which he started having cough congestion body aches and pleuritic chest pain with coughing.  In the ED patient was noted to be negative for COVID and white blood cell counts were within normal limits.  Temperature max was 101 F and was mildly tachycardic at 125.  Lactate was 1.4.  Pro-Cal was 0.4.  CTA of the chest was suggestive of atypical pneumonia/atypical TB with lymphadenopathy and there was note of segmental PE.  Patient was then admitted hospital for evaluation and treatment.  Infectious disease was consulted as well.   Assessment/Plan:  Principal Problem:   Acute respiratory failure with hypoxia ([name]) Active Problems:   HIV (human immunodeficiency virus infection) ([place])   Community acquired pneumonia   AIDS (acquired immune deficiency syndrome) ([place])   Acute pulmonary embolus ([name])   Type 2 diabetes mellitus ([place])   History of TB (tuberculosis)   History of Pneumocystis jirovecii pneumonia   CKD (chronic kidney disease) stage 3, GFR 30-59 ml/min ([name])   Acute pulmonary embolism ([place])  Acute hypoxic respiratory failure likely secondary to pneumonia.   Possibilities of atypical pneumonia, history of PJP.  Possibility of viral pneumonia as well.  CT scan also shows significant scarring and emphysematous changes.  ID has seen the patient at this time and less concern for PTB.  Continue Antiretroviral treatment, Rocephin and Zithromax.  Continue airborne precautions.  Check LDH, Legionella and strep pneumonia antigen.  Urine histoplasma has been sent.  Follow respiratory culture.  Follow infectious disease  recommendations.  Follow cultures.  Currently on 2 L of oxygen.  History of pulmonary TB.  Treated more than 20 years ago in [place].  Protein/albumin dissociation.  Multiple myeloma screen has been sent.  Left lower pulmonary  embolism. CTA of the chest showing segmental pulmonary embolism.  Continue telemetry monitor.  Check 2D echocardiogram and ultrasound of the bilateral lower extremities.  Has been started heparin drip.  HIV. HIV RNA and CD4 count has been sent.  HAART has been reinitiated.  Diabetes mellitus type 2. Continue to hold her Janumet.  Continue sliding scale insulin Accu-Cheks diabetic diet  CKD stage IIIa. Creatinine of 1.5.  Received gentle IV fluids.  We will continue to monitor.  DVT prophylaxis:    Heparin drip   Code Status: Full  Family Communication: None  Status is: Inpatient  Remains inpatient appropriate because:IV treatments appropriate due to intensity of illness or inability to take PO and Inpatient level of care appropriate due to severity of illness  Dispo: The patient is from: Home              Anticipated d/c is to: Home              Patient currently is not medically stable to d/c.   Difficult to place patient No   Consultants: Infectious disease  Procedures: None  Anti-infectives:  Rocephin Zithromax ART  Anti-infectives (From admission, onward)    Start     Dose/Rate Route Frequency Ordered Stop   11/03/20 0800  Darunavir-Cobicistat-Emtricitabine-Tenofovir Alafenamide (SYMTUZA) 800-150-200-10 MG TABS 1 tablet        1 tablet  Oral Daily with breakfast 11/02/20 1201     11/02/20 1300  cefTRIAXone (ROCEPHIN) 2 g in sodium chloride 0.9 % 100 mL IVPB        2 g 200 mL/hr over 30 Minutes Intravenous Every 24 hours 11/02/20 1148     11/02/20 1300  dolutegravir (TIVICAY) tablet 50 mg        50 mg Oral Daily 11/02/20 1201     11/02/20 1245  azithromycin (ZITHROMAX) tablet 500 mg        500 mg Oral Daily 11/02/20 1148 11/07/20 0959    11/02/20 1000  vancomycin (VANCOCIN) IVPB 1000 mg/200 mL premix  Status:  Discontinued        1,000 mg 200 mL/hr over 60 Minutes Intravenous Every 12 hours 11/01/20 2014 11/02/20 1148   11/02/20 1000  sulfamethoxazole-trimethoprim (BACTRIM DS) 800-160 MG per tablet 1 tablet        1 tablet Oral Daily 11/01/20 2148     11/02/20 0600  ceFEPIme (MAXIPIME) 2 g in sodium chloride 0.9 % 100 mL IVPB  Status:  Discontinued        2 g 200 mL/hr over 30 Minutes Intravenous Every 8 hours 11/01/20 2014 11/02/20 1148   11/01/20 1930  vancomycin (VANCOREADY) IVPB 1750 mg/350 mL        1,750 mg 175 mL/hr over 120 Minutes Intravenous  Once 11/01/20 1851 11/02/20 0212   11/01/20 1900  ceFEPIme (MAXIPIME) 2 g in sodium chloride 0.9 % 100 mL IVPB        2 g 200 mL/hr over 30 Minutes Intravenous  Once 11/01/20 1851 11/01/20 2023   11/01/20 1900  metroNIDAZOLE (FLAGYL) IVPB 500 mg        500 mg 100 mL/hr over 60 Minutes Intravenous  Once 11/01/20 1851 11/01/20 2132      Subjective: Today, patient was seen and examined at bedside.  Patient complains of cough with decreased appetite and fatigue.  Objective: Vitals:   11/02/20 0852 11/02/20 1200  BP: 112/79 107/67  Pulse:  97  Resp: 16 20  Temp: 98.8 F (37.1 C) 98.9 F (37.2 C)  SpO2: 92% 95%    Intake/Output Summary (Last 24 hours) at 11/02/2020 1441 Last data filed at 11/02/2020 1217 Gross per 24 hour  Intake 154.22 ml  Output 450 ml  Net -295.78 ml   Filed Weights   11/01/20 1654  Weight: 81.6 kg   Body mass index is 23.75 kg/m.   Physical Exam: GENERAL: Patient is alert awake and oriented. Not in obvious distress.  Appears ill. HENT: No scleral pallor or icterus. Pupils equally reactive to light. Oral mucosa is moist, on nasal cannula oxygen 2 L/min NECK: is supple, no gross swelling noted. CHEST: Decreased breath sounds bilaterally. CVS: S1 and S2 heard, no murmur. Regular rate and rhythm.  ABDOMEN: Soft, non-tender, bowel  sounds are present.  Left lower quadrant colostomy. EXTREMITIES: No edema. CNS: Cranial nerves are intact. No focal motor deficits. SKIN: warm and dry without rashes.  Data Review: I have personally reviewed the following laboratory data and studies,  CBC: Recent Labs  Lab 11/01/20 1656 11/01/20 1719 11/02/20 0221  WBC 8.4  --  7.9  NEUTROABS 5.8  --   --   HGB 14.7 16.0 12.8*  HCT 44.9 47.0 39.0  MCV 97.0  --  96.5  PLT 124*  --  607   Basic Metabolic Panel: Recent Labs  Lab 11/01/20 1656 11/01/20 1719 11/02/20 0221  NA 134* 138 131*  K 4.4 4.1 3.5  CL 99  --  102  CO2 23  --  22  GLUCOSE 171*  --  98  BUN 27*  --  24*  CREATININE 1.65*  --  1.52*  CALCIUM 9.2  --  8.4*   Liver Function Tests: Recent Labs  Lab 11/01/20 1656  AST 36  ALT 29  ALKPHOS 55  BILITOT 0.9  PROT 10.7*  ALBUMIN 2.5*   Recent Labs  Lab 11/01/20 1656  LIPASE 45   No results for input(s): AMMONIA in the last 168 hours. Cardiac Enzymes: No results for input(s): CKTOTAL, CKMB, CKMBINDEX, TROPONINI in the last 168 hours. BNP (last 3 results) No results for input(s): BNP in the last 8760 hours.  ProBNP (last 3 results) No results for input(s): PROBNP in the last 8760 hours.  CBG: Recent Labs  Lab 11/01/20 1701 11/01/20 2346 11/02/20 0345 11/02/20 0903 11/02/20 1210  GLUCAP 186* 93 92 140* 91   Recent Results (from the past 240 hour(s))  Resp Panel by RT-PCR (Flu A&B, Covid) Nasopharyngeal Swab     Status: None   Collection Time: 11/01/20  5:02 PM   Specimen: Nasopharyngeal Swab; Nasopharyngeal(NP) swabs in vial transport medium  Result Value Ref Range Status   SARS Coronavirus 2 by RT PCR NEGATIVE NEGATIVE Final    Comment: (NOTE) SARS-CoV-2 target nucleic acids are NOT DETECTED.  The SARS-CoV-2 RNA is generally detectable in upper respiratory specimens during the acute phase of infection. The lowest concentration of SARS-CoV-2 viral copies this assay can detect  is 138 copies/mL. A negative result does not preclude SARS-Cov-2 infection and should not be used as the sole basis for treatment or other patient management decisions. A negative result may occur with  improper specimen collection/handling, submission of specimen other than nasopharyngeal swab, presence of viral mutation(s) within the areas targeted by this assay, and inadequate number of viral copies(<138 copies/mL). A negative result must be combined with clinical observations, patient history, and epidemiological information. The expected result is Negative.  Fact Sheet for Patients:  EntrepreneurPulse.com.au  Fact Sheet for Healthcare Providers:  IncredibleEmployment.be  This test is no t yet approved or cleared by the [place] FDA and  has been authorized for detection and/or diagnosis of SARS-CoV-2 by FDA under an Emergency Use Authorization (EUA). This EUA will remain  in effect (meaning this test can be used) for the duration of the COVID-19 declaration under Section 564(b)(1) of the Act, 21 U.S.C.section 360bbb-3(b)(1), unless the authorization is terminated  or revoked sooner.       Influenza A by PCR NEGATIVE NEGATIVE Final   Influenza B by PCR NEGATIVE NEGATIVE Final    Comment: (NOTE) The Xpert Xpress SARS-CoV-2/FLU/RSV plus assay is intended as an aid in the diagnosis of influenza from Nasopharyngeal swab specimens and should not be used as a sole basis for treatment. Nasal washings and aspirates are unacceptable for Xpert Xpress SARS-CoV-2/FLU/RSV testing.  Fact Sheet for Patients: EntrepreneurPulse.com.au  Fact Sheet for Healthcare Providers: IncredibleEmployment.be  This test is not yet approved or cleared by the [place] FDA and has been authorized for detection and/or diagnosis of SARS-CoV-2 by FDA under an Emergency Use Authorization (EUA). This EUA will remain in effect  (meaning this test can be used) for the duration of the COVID-19 declaration under Section 564(b)(1) of the Act, 21 U.S.C. section 360bbb-3(b)(1), unless the authorization is terminated or revoked.  Performed at [facility], [place] [address] [place], [place] [address]  Expectorated Sputum Assessment w Gram Stain, Rflx to Resp Cult     Status: None   Collection Time: 11/01/20 11:57 PM   Specimen: Expectorated Sputum  Result Value Ref Range Status   Specimen Description EXPECTORATED SPUTUM  Final   Special Requests NONE  Final   Sputum evaluation   Final    THIS SPECIMEN IS ACCEPTABLE FOR SPUTUM CULTURE Performed at [facility], [place] [address] [place], [place] [address]    Report Status 11/02/2020 FINAL  Final  Culture, Respiratory w Gram Stain     Status: None (Preliminary result)   Collection Time: 11/01/20 11:57 PM  Result Value Ref Range Status   Specimen Description EXPECTORATED SPUTUM  Final   Special Requests NONE Reflexed from [id number]  Final   Gram Stain   Final    RARE WBC PRESENT,BOTH PMN AND MONONUCLEAR MODERATE GRAM NEGATIVE RODS MODERATE GRAM POSITIVE COCCI IN PAIRS Performed at [facility], [place] [address] [place], [place] [address]    Culture PENDING  Incomplete   Report Status PENDING  Incomplete  MRSA Next Gen by PCR, Nasal     Status: None   Collection Time: 11/01/20 11:59 PM   Specimen: Nasal Mucosa; Nasal Swab  Result Value Ref Range Status   MRSA by PCR Next Gen NOT DETECTED NOT DETECTED Final    Comment: (NOTE) The GeneXpert MRSA Assay (FDA approved for NASAL specimens only), is one component of a comprehensive MRSA colonization surveillance program. It is not intended to diagnose MRSA infection nor to guide or monitor treatment for MRSA infections. Test performance is not FDA approved in patients less than 8 years old. Performed at [facility], [place] [address], [place], [place] [address]      Studies: DG Chest 2  View  Result Date: 11/01/2020 CLINICAL DATA:  Cough and fever.  Shortness of breath. EXAM: CHEST - 2 VIEW COMPARISON:  05/17/2019 FINDINGS: The heart size and mediastinal contours are within normal limits. Extensive scarring in left mid and lower lung again seen. Emphysematous changes again noted with biapical pleural-parenchymal scarring. No new or worsening areas of pulmonary opacity are seen. No evidence of pleural effusion. IMPRESSION: Stable extensive left lung scarring and emphysema. No acute findings. Electronically Signed   By: Marlaine Hind M.D.   On: 11/01/2020 17:57   CT Angio Chest PE W and/or Wo Contrast  Result Date: 11/01/2020 CLINICAL DATA:  Pulmonary embolus suspected. High probability. Chest pain, shortness of breath, tachycardia. Abdominal pain, acute nonlocalized. Generalized. Fever. Concern for infection. EXAM: CT ANGIOGRAPHY CHEST CT ABDOMEN AND PELVIS WITH CONTRAST TECHNIQUE: Multidetector CT imaging of the chest was performed using the standard protocol during bolus administration of intravenous contrast. Multiplanar CT image reconstructions and MIPs were obtained to evaluate the vascular anatomy. Multidetector CT imaging of the abdomen and pelvis was performed using the standard protocol during bolus administration of intravenous contrast. CONTRAST:  152m OMNIPAQUE IOHEXOL 350 MG/ML SOLN COMPARISON:  CT a chest 05/17/2019 FINDINGS: CTA CHEST FINDINGS Cardiovascular: There is moderately good opacification of the central and segmental pulmonary arteries. There is overall decreased opacification of the left lower lung pulmonary arteries in comparison to the right and there is a small linear filling defect in the left lower lobe pulmonary artery. This is probably a small focal pulmonary embolus. No other emboli are identified. Normal caliber thoracic aorta without evidence of dissection. Normal heart size. No pericardial effusions. Mediastinum/Nodes: Small esophageal hiatal hernia.  Esophagus is decompressed. Mediastinal  and hilar lymphadenopathy with right hilar lymph nodes measuring up to 2.4 cm diameter and left pretracheal nodes measuring up to 1.5 cm diameter. Lymph nodes appear to be increasing in size since the previous study. Mild right axillary lymphadenopathy with nodes measuring 11 mm short axis dimension. Lungs/Pleura: There is evidence of chronic lung disease with emphysematous changes, apical scarring, cystic and cylindrical bronchiectasis with bronchial wall thickening and either intraluminal secretions or mucous plugging. Patchy peribronchial infiltrates could represent bronchopneumonia or chronic inflammatory process. Some of the ground-glass changes seen previously in the left lingula are improved since previous study but there is increasing change in the left lower lung. Musculoskeletal: No chest wall abnormality. No acute or significant osseous findings. Review of the MIP images confirms the above findings. CT ABDOMEN and PELVIS FINDINGS Hepatobiliary: Mild fatty infiltration of the liver. No focal lesions. Gallbladder and bile ducts are unremarkable. Pancreas: Unremarkable. No pancreatic ductal dilatation or surrounding inflammatory changes. Spleen: Normal in size without focal abnormality. Adrenals/Urinary Tract: Adrenal glands are unremarkable. Kidneys are normal, without renal calculi, focal lesion, or hydronephrosis. Bladder is unremarkable. Stomach/Bowel: Left lower quadrant diverting colostomy. Peristomal hernia containing fat. Stomach, small bowel, and colon are not abnormally distended. Scattered stool throughout the colon. No inflammatory changes are appreciated. Appendix is not identified. Vascular/Lymphatic: No significant lymphadenopathy. Scattered aortic calcification. No aneurysm. Reproductive: Prostate is unremarkable. Other: No free air or free fluid in the abdomen. Musculoskeletal: No acute or significant osseous findings. Review of the MIP images confirms  the above findings. IMPRESSION: 1. Focal pulmonary embolus in the left lower lung pulmonary artery. 2. Chronic lung disease with emphysematous change, bronchiectasis, peribronchial thickening, and intraluminal bronchial secretions/mucus plugging. Patchy peribronchial infiltrates waxing and waning since prior study. Changes could indicate atypical pneumonia or TB. 3. Hilar and mediastinal lymphadenopathy, progressing since the previous study and probably reactive although nonspecific. 4. No acute process demonstrated in the abdomen or pelvis. 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 5. Small esophageal hiatal hernia.  Mild aortic atherosclerosis. Critical Value/emergent results were called by telephone at the time of interpretation on 11/01/2020 at 8:46 pm to provider [facility] , who verbally acknowledged these results. Electronically Signed   By: Lucienne Capers M.D.   On: 11/01/2020 20:50   CT ABDOMEN PELVIS W CONTRAST  Result Date: 11/01/2020 CLINICAL DATA:  Pulmonary embolus suspected. High probability. Chest pain, shortness of breath, tachycardia. Abdominal pain, acute nonlocalized. Generalized. Fever. Concern for infection. EXAM: CT ANGIOGRAPHY CHEST CT ABDOMEN AND PELVIS WITH CONTRAST TECHNIQUE: Multidetector CT imaging of the chest was performed using the standard protocol during bolus administration of intravenous contrast. Multiplanar CT image reconstructions and MIPs were obtained to evaluate the vascular anatomy. Multidetector CT imaging of the abdomen and pelvis was performed using the standard protocol during bolus administration of intravenous contrast. CONTRAST:  175m OMNIPAQUE IOHEXOL 350 MG/ML SOLN COMPARISON:  CT a chest 05/17/2019 FINDINGS: CTA CHEST FINDINGS Cardiovascular: There is moderately good opacification of the central and segmental pulmonary arteries. There is overall decreased opacification of the left lower lung pulmonary arteries in comparison to  the right and there is a small linear filling defect in the left lower lobe pulmonary artery. This is probably a small focal pulmonary embolus. No other emboli are identified. Normal caliber thoracic aorta without evidence of dissection. Normal heart size. No pericardial effusions. Mediastinum/Nodes: Small esophageal hiatal hernia. Esophagus is decompressed. Mediastinal and hilar lymphadenopathy with right hilar lymph nodes measuring up to 2.4 cm diameter and  left pretracheal nodes measuring up to 1.5 cm diameter. Lymph nodes appear to be increasing in size since the previous study. Mild right axillary lymphadenopathy with nodes measuring 11 mm short axis dimension. Lungs/Pleura: There is evidence of chronic lung disease with emphysematous changes, apical scarring, cystic and cylindrical bronchiectasis with bronchial wall thickening and either intraluminal secretions or mucous plugging. Patchy peribronchial infiltrates could represent bronchopneumonia or chronic inflammatory process. Some of the ground-glass changes seen previously in the left lingula are improved since previous study but there is increasing change in the left lower lung. Musculoskeletal: No chest wall abnormality. No acute or significant osseous findings. Review of the MIP images confirms the above findings. CT ABDOMEN and PELVIS FINDINGS Hepatobiliary: Mild fatty infiltration of the liver. No focal lesions. Gallbladder and bile ducts are unremarkable. Pancreas: Unremarkable. No pancreatic ductal dilatation or surrounding inflammatory changes. Spleen: Normal in size without focal abnormality. Adrenals/Urinary Tract: Adrenal glands are unremarkable. Kidneys are normal, without renal calculi, focal lesion, or hydronephrosis. Bladder is unremarkable. Stomach/Bowel: Left lower quadrant diverting colostomy. Peristomal hernia containing fat. Stomach, small bowel, and colon are not abnormally distended. Scattered stool throughout the colon. No  inflammatory changes are appreciated. Appendix is not identified. Vascular/Lymphatic: No significant lymphadenopathy. Scattered aortic calcification. No aneurysm. Reproductive: Prostate is unremarkable. Other: No free air or free fluid in the abdomen. Musculoskeletal: No acute or significant osseous findings. Review of the MIP images confirms the above findings. IMPRESSION: 1. Focal pulmonary embolus in the left lower lung pulmonary artery. 2. Chronic lung disease with emphysematous change, bronchiectasis, peribronchial thickening, and intraluminal bronchial secretions/mucus plugging. Patchy peribronchial infiltrates waxing and waning since prior study. Changes could indicate atypical pneumonia or TB. 3. Hilar and mediastinal lymphadenopathy, progressing since the previous study and probably reactive although nonspecific. 4. No acute process demonstrated in the abdomen or pelvis. 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 5. Small esophageal hiatal hernia.  Mild aortic atherosclerosis. Critical Value/emergent results were called by telephone at the time of interpretation on 11/01/2020 at 8:46 pm to provider [facility] , who verbally acknowledged these results. Electronically Signed   By: Lucienne Capers M.D.   On: 11/01/2020 20:50   ECHOCARDIOGRAM COMPLETE  Result Date: 11/02/2020    ECHOCARDIOGRAM REPORT   Patient Name:   [name] Date of Exam: 11/02/2020 Medical Rec #:  [id number]          Height:       73.0 in Accession #:    [id number]         Weight:       180.0 lb Date of Birth:  11/28/1970           BSA:          2.057 m Patient Age:    73 years           BP:           107/67 mmHg Patient Gender: M                  HR:           96 bpm. Exam Location:  Inpatient Procedure: 2D Echo, Cardiac Doppler and Color Doppler Indications:    I26.02 Pulmonary embolus  History:        Patient has prior history of Echocardiogram examinations, most                 recent 05/23/2019.  COPD. Persistent coughing. H/O TB. HIV.  Sonographer:  [place] Referring Phys: [place]  1. Left ventricular ejection fraction, by estimation, is 45 to 50%. The left ventricle has mildly decreased function. The left ventricle demonstrates global hypokinesis. Left ventricular diastolic parameters are indeterminate.  2. Right ventricular systolic function is moderately reduced. RV free wall hypokinesis. The right ventricular size is normal. There is mildly elevated pulmonary artery systolic pressure. The estimated right ventricular systolic pressure is 17.5 mmHg.  3. The mitral valve is normal in structure. Trivial mitral valve regurgitation.  4. The aortic valve was not well visualized. Aortic valve regurgitation is not visualized. No aortic stenosis is present.  5. The inferior vena cava is normal in size with greater than 50% respiratory variability, suggesting right atrial pressure of 3 mmHg. FINDINGS  Left Ventricle: Left ventricular ejection fraction, by estimation, is 45 to 50%. The left ventricle has mildly decreased function. The left ventricle demonstrates global hypokinesis. The left ventricular internal cavity size was normal in size. There is  no left ventricular hypertrophy. Left ventricular diastolic parameters are indeterminate. Right Ventricle: The right ventricular size is normal. Right vetricular wall thickness was not well visualized. Right ventricular systolic function is moderately reduced. There is mildly elevated pulmonary artery systolic pressure. The tricuspid regurgitant velocity is 2.84 m/s, and with an assumed right atrial pressure of 3 mmHg, the estimated right ventricular systolic pressure is 10.2 mmHg. Left Atrium: Left atrial size was normal in size. Right Atrium: Right atrial size was normal in size. Pericardium: There is no evidence of pericardial effusion. Mitral Valve: The mitral valve is normal in structure. Trivial mitral valve regurgitation.  Tricuspid Valve: The tricuspid valve is normal in structure. Tricuspid valve regurgitation is trivial. Aortic Valve: The aortic valve was not well visualized. Aortic valve regurgitation is not visualized. No aortic stenosis is present. Aortic valve mean gradient measures 6.0 mmHg. Aortic valve peak gradient measures 9.6 mmHg. Aortic valve area, by VTI measures 2.05 cm. Pulmonic Valve: The pulmonic valve was not well visualized. Pulmonic valve regurgitation is not visualized. Aorta: The aortic root is normal in size and structure. Venous: The inferior vena cava is normal in size with greater than 50% respiratory variability, suggesting right atrial pressure of 3 mmHg. IAS/Shunts: The interatrial septum was not well visualized.  LEFT VENTRICLE PLAX 2D LVIDd:         5.00 cm LVIDs:         3.60 cm LV PW:         1.00 cm LV IVS:        0.80 cm LVOT diam:     2.20 cm LV SV:         56 LV SV Index:   27 LVOT Area:     3.80 cm  LV Volumes (MOD) LV vol d, MOD A2C: 86.0 ml LV vol d, MOD A4C: 86.9 ml LV vol s, MOD A2C: 41.6 ml LV vol s, MOD A4C: 43.0 ml LV SV MOD A2C:     44.4 ml LV SV MOD A4C:     86.9 ml LV SV MOD BP:      42.8 ml RIGHT VENTRICLE RV Basal diam:  2.90 cm RV S prime:     8.81 cm/s TAPSE (M-mode): 1.9 cm LEFT ATRIUM           Index       RIGHT ATRIUM           Index LA diam:      3.40 cm 1.65  cm/m  RA Area:     15.60 cm LA Vol (A2C): 44.9 ml 21.82 ml/m RA Volume:   42.80 ml  20.80 ml/m  AORTIC VALVE AV Area (Vmax):    2.10 cm AV Area (Vmean):   2.00 cm AV Area (VTI):     2.05 cm AV Vmax:           155.00 cm/s AV Vmean:          114.000 cm/s AV VTI:            0.272 m AV Peak Grad:      9.6 mmHg AV Mean Grad:      6.0 mmHg LVOT Vmax:         85.70 cm/s LVOT Vmean:        60.000 cm/s LVOT VTI:          0.147 m LVOT/AV VTI ratio: 0.54  AORTA Ao Root diam: 3.20 cm MITRAL VALVE               TRICUSPID VALVE MV Area (PHT): 4.36 cm    TR Peak grad:   32.3 mmHg MV Decel Time: 174 msec    TR Vmax:         284.00 cm/s MV E velocity: 60.40 cm/s MV A velocity: 61.30 cm/s  SHUNTS MV E/A ratio:  0.99        Systemic VTI:  0.15 m                            Systemic Diam: 2.20 cm Oswaldo Milian MD Electronically signed by Oswaldo Milian MD Signature Date/Time: 11/02/2020/2:05:48 PM    Final       Flora Lipps, MD  [facility] 11/02/2020  If 7PM-7AM, please contact night-coverage

## 2020-11-02 NOTE — ED Notes (Signed)
Breakfast Ordered 

## 2020-11-02 NOTE — Progress Notes (Signed)
  Echocardiogram 2D Echocardiogram has been performed.  Roosvelt Maser F 11/02/2020, 1:35 PM

## 2020-11-02 NOTE — Progress Notes (Signed)
Spoke to [facility] regarding airborne precautions. Ordered to keep airborne precautions and will reevaluate in the AM.

## 2020-11-02 NOTE — ED Notes (Addendum)
This RN offered to empty pt ostomy - pt does not want to empty at this time - pt would also like to wait to change into gown

## 2020-11-02 NOTE — Progress Notes (Signed)
ANTICOAGULATION CONSULT NOTE   Pharmacy Consult for heparin Indication: pulmonary embolus  No Known Allergies  Patient Measurements: Height: 6\' 1"  (185.4 cm) Weight: 81.6 kg (180 lb) IBW/kg (Calculated) : 79.9 Heparin Dosing Weight: 81.6 kg   Vital Signs: Temp: 98.7 F (37.1 C) (08/20 0348) Temp Source: Oral (08/20 0348) BP: 104/65 (08/20 0600) Pulse Rate: 88 (08/20 0600)  Labs: Recent Labs    11/01/20 1656 11/01/20 1719 11/01/20 1926 11/02/20 0221 11/02/20 0500  HGB 14.7 16.0  --  12.8*  --   HCT 44.9 47.0  --  39.0  --   PLT 124*  --   --  162  --   HEPARINUNFRC  --   --   --   --  0.15*  CREATININE 1.65*  --   --  1.52*  --   TROPONINIHS  --   --  24* 12  --      Estimated Creatinine Clearance: 66.4 mL/min (A) (by C-G formula based on SCr of 1.52 mg/dL (H)).   Medical History: Past Medical History:  Diagnosis Date   Depression    "stress and depression for any man is common" (03/21/2014)   Diabetes mellitus without complication ([name])    Dyspnea    Genital warts 01/04/2017   Hepatitis    "I don't know what hepatitis I have"   HIV disease ([name])    TB (pulmonary tuberculosis)    previously treated according to refugee documentation    Assessment: 32 YOM found to have an acute PE in the left lower pulmonary artery. Pharmacy consulted to start IV heparin.   Heparin level subtherapeutic (0.15) on gtt at 1350 units/hr. No issues with line or bleeding reported per RN.  Goal of Therapy:  Heparin level 0.3-0.7 units/ml Monitor platelets by anticoagulation protocol: Yes   Plan:  Rebolus heparin 2500 units Increase heparin infusion to 1650 units/hr F/u 6 hr heparin level   55, PharmD, BCPS Please see amion for complete clinical pharmacist phone list 11/02/2020 6:27 AM

## 2020-11-02 NOTE — Progress Notes (Signed)
Patient to 4E16 from ED. Vital signs obtained. On monitor CCMD notified. CHG bath completed. Alert and oriented to room and call light. Call bell within reach.  Sabra Heck, RN

## 2020-11-02 NOTE — Consult Note (Signed)
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 Ran out of meds and not currently taking for a few months Previous cd4 99 several months ago. Suspect still < 200 or 15% Screen for endemic fungi/other fungal OI Serum cryptoAg negative -- could restart hiv meds Depending on cd4 count could send for other OI w/u for lymphadenitis subsequently. Certainly lymphoma could be at any cd4 count.   #hx tb [place] ct chest last year and this admission. Do not suspect tb based on that and clinical history   #hcm check syphilis  #protein/albumin dissociation Will send basic MM screen. Suspect polyclonal immunoglobulin process due to aids though, but MM screen prudent  #pe Started on anticoagulation per primary team  Plan: Urine strep/legionella Urine histo Ldh, fungitell, pjp smear F/u respiratory cx F/u bcx F/u hiv viral load and cd4 Mrsa nares negative and bcx ngtd; stop vanc Switch cefepime  to ceftriaxone low risk pseudomonas infection Add azithromycin for CAP coverage with ceftriaxone If worsening hypoxemia or more sob, will add prednisone pjp course I have low suspicion for pjp pna -- can dc bactrim tx dose if fungitel negative or cd4 > 200 Reasonable to resume symtuza and tivicay No need for tb isolation or workup at this time   I spent 60 minute reviewing data/chart, and coordinating care and >50% direct face to face time providing counseling/discussing diagnostics/treatment plan with patient      ------------------------------------------------ Principal Problem:   Acute respiratory failure with hypoxia ([name]) Active Problems:   HIV (human immunodeficiency virus infection) ([name])   Acute pulmonary embolus ([name])   Type 2 diabetes mellitus ([name])   History of TB (tuberculosis)   History of Pneumocystis jirovecii pneumonia   CKD (chronic kidney disease) stage 3, GFR 30-59 ml/min ([name])   Acute pulmonary embolism ([name])    HPI: [name] is a 50 y.o. male aids, lost to f/u, admitted with sepsis and flu like illness   Patient previously seen dr Drue SecondSnider in [facility]. Was placed on symtuza/tivica. He ran out 1-2 months ago and unclear why hasn't followed up to discuss refill at [facility]  He reports a few days of subjective f/c, malaise, dry cough, poor appetite. These are progress so came to ED on 8/19  No sick contact. Lives with roommate who is fine. No recent travel No ivdu or street drug use  Denies headache, rash, joint pain, back pain, n/v/diarrhea No weight loss, nightsweat  On presentation, fever to  101s No leukocytosis Chest ct (reviewed) showed scarring, bilateral emphysematous changes compared to last year Started on bsAbx including bactrim pjp tx dose and admitted  Feels overall better today      Family History  Problem Relation Age of Onset   Hypertension Other    Heart disease Sister     Social History   Tobacco Use   Smoking status:  Former    Packs/day: 0.50    Years: 6.00    Pack years: 3.00    Types: Cigarettes    Quit date: 03/16/2001    Years since quitting: 19.6   Smokeless tobacco: Never   Tobacco comments:    "quit smoking ~ 2003"  Vaping Use   Vaping Use: Never used  Substance Use Topics   Alcohol use: Yes    Comment: drinks bottled beer intermittently   Drug use: No    No Known Allergies  Review of Systems: ROS All Other ROS was negative, except mentioned above   Past Medical History:  Diagnosis Date   Depression    "stress and depression for any man is common" (03/21/2014)   Diabetes mellitus without complication ([name])    Dyspnea    Genital warts 01/04/2017   Hepatitis    "I don't know what hepatitis I have"   HIV disease ([name])    TB (pulmonary tuberculosis)    previously treated according to refugee documentation       Scheduled Meds:  insulin aspart  0-9 Units Subcutaneous Q4H   sulfamethoxazole-trimethoprim  1 tablet Oral Daily   Continuous Infusions:  ceFEPime (MAXIPIME) IV Stopped (11/02/20 0603)   heparin 1,650 Units/hr (11/02/20 0923)   lactated ringers 125 mL/hr at 11/02/20 0926   vancomycin 1,000 mg (11/02/20 0927)   PRN Meds:.acetaminophen **OR** acetaminophen, morphine injection, ondansetron **OR** ondansetron (ZOFRAN) IV   OBJECTIVE: Blood pressure 112/79, pulse 92, temperature 98.8 F (37.1 C), temperature source Oral, resp. rate 16, height 6\' 1"  (1.854 m), weight 81.6 kg, SpO2 92 %.  Physical Exam  General: Lean appearing 50 yo male, slightly older than stated age, occasional dry cough but no distress otherwise; fully conversant Heent: normocephalic; per; conj clear; eomi; orophayrnx clear Neck supple Cv rrr no mrg Lungs normal respiratory effort; scattered upper airway sound Abd s/nt Ext no edema Skin no rash Neuro: cn2-12 intact, no tremor/rigidity Psych alert/oriented  Lab Results Lab Results  Component Value Date   WBC 7.9 11/02/2020   HGB 12.8  (L) 11/02/2020   HCT 39.0 11/02/2020   MCV 96.5 11/02/2020   PLT 162 11/02/2020    Lab Results  Component Value Date   CREATININE 1.52 (H) 11/02/2020   BUN 24 (H) 11/02/2020   NA 131 (L) 11/02/2020   K 3.5 11/02/2020   CL 102 11/02/2020   CO2 22 11/02/2020    Lab Results  Component Value Date   ALT 29 11/01/2020   AST 36 11/01/2020   ALKPHOS 55 11/01/2020   BILITOT 0.9 11/01/2020      Microbiology: Recent Results (from the past 240 hour(s))  Resp Panel by RT-PCR (Flu A&B, Covid) Nasopharyngeal Swab     Status: None   Collection Time: 11/01/20  5:02 PM   Specimen: Nasopharyngeal Swab; Nasopharyngeal(NP) swabs in vial transport medium  Result Value Ref Range Status   SARS Coronavirus 2 by RT PCR NEGATIVE NEGATIVE Final    Comment: (NOTE) SARS-CoV-2 target nucleic acids are NOT DETECTED.  The SARS-CoV-2 RNA is generally detectable in upper respiratory specimens during  the acute phase of infection. The lowest concentration of SARS-CoV-2 viral copies this assay can detect is 138 copies/mL. A negative result does not preclude SARS-Cov-2 infection and should not be used as the sole basis for treatment or other patient management decisions. A negative result may occur with  improper specimen collection/handling, submission of specimen other than nasopharyngeal swab, presence of viral mutation(s) within the areas targeted by this assay, and inadequate number of viral copies(<138 copies/mL). A negative result must be combined with clinical observations, patient history, and epidemiological information. The expected result is Negative.  Fact Sheet for Patients:  BloggerCourse.com  Fact Sheet for Healthcare Providers:  SeriousBroker.it  This test is no t yet approved or cleared by the [place] FDA and  has been authorized for detection and/or diagnosis of SARS-CoV-2 by FDA under an Emergency Use Authorization (EUA). This  EUA will remain  in effect (meaning this test can be used) for the duration of the COVID-19 declaration under Section 564(b)(1) of the Act, 21 U.S.C.section 360bbb-3(b)(1), unless the authorization is terminated  or revoked sooner.       Influenza A by PCR NEGATIVE NEGATIVE Final   Influenza B by PCR NEGATIVE NEGATIVE Final    Comment: (NOTE) The Xpert Xpress SARS-CoV-2/FLU/RSV plus assay is intended as an aid in the diagnosis of influenza from Nasopharyngeal swab specimens and should not be used as a sole basis for treatment. Nasal washings and aspirates are unacceptable for Xpert Xpress SARS-CoV-2/FLU/RSV testing.  Fact Sheet for Patients: BloggerCourse.com  Fact Sheet for Healthcare Providers: SeriousBroker.it  This test is not yet approved or cleared by the [place] FDA and has been authorized for detection and/or diagnosis of SARS-CoV-2 by FDA under an Emergency Use Authorization (EUA). This EUA will remain in effect (meaning this test can be used) for the duration of the COVID-19 declaration under Section 564(b)(1) of the Act, 21 U.S.C. section 360bbb-3(b)(1), unless the authorization is terminated or revoked.  Performed at [facility], [address], [place], [place] [address]   Expectorated Sputum Assessment w Gram Stain, Rflx to Resp Cult     Status: None   Collection Time: 11/01/20 11:57 PM   Specimen: Expectorated Sputum  Result Value Ref Range Status   Specimen Description EXPECTORATED SPUTUM  Final   Special Requests NONE  Final   Sputum evaluation   Final    THIS SPECIMEN IS ACCEPTABLE FOR SPUTUM CULTURE Performed at [facility], [address], [place], [place] [address]    Report Status 11/02/2020 FINAL  Final  Culture, Respiratory w Gram Stain     Status: None (Preliminary result)   Collection Time: 11/01/20 11:57 PM  Result Value Ref Range Status   Specimen Description EXPECTORATED  SPUTUM  Final   Special Requests NONE Reflexed from [id number]  Final   Gram Stain   Final    RARE WBC PRESENT,BOTH PMN AND MONONUCLEAR MODERATE GRAM NEGATIVE RODS MODERATE GRAM POSITIVE COCCI IN PAIRS Performed at [facility], [address] [place], [place] [address]    Culture PENDING  Incomplete   Report Status PENDING  Incomplete  MRSA Next Gen by PCR, Nasal     Status: None   Collection Time: 11/01/20 11:59 PM   Specimen: Nasal Mucosa; Nasal Swab  Result Value Ref Range Status   MRSA by PCR Next Gen NOT DETECTED NOT DETECTED Final    Comment: (NOTE) The GeneXpert MRSA Assay (FDA approved for NASAL specimens only), is one  component of a comprehensive MRSA colonization surveillance program. It is not intended to diagnose MRSA infection nor to guide or monitor treatment for MRSA infections. Test performance is not FDA approved in patients less than 44 years old. Performed at [facility], [address], [place], [place] [address]      Serology:    Imaging: If present, new imagings (plain films, ct scans, and mri) have been personally visualized and interpreted; radiology reports have been reviewed. Decision making incorporated into the Impression / Recommendations.  8/19 abd/pelv/chest ct pe protocol 1. Focal pulmonary embolus in the left lower lung pulmonary artery. 2. Chronic lung disease with emphysematous change, bronchiectasis, peribronchial thickening, and intraluminal bronchial secretions/mucus plugging. Patchy peribronchial infiltrates waxing and waning since prior study. Changes could indicate atypical pneumonia or TB. 3. Hilar and mediastinal lymphadenopathy, progressing since the previous study and probably reactive although nonspecific. 4. No acute process demonstrated in the abdomen or pelvis. 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 5. Small esophageal hiatal hernia.  Mild aortic atherosclerosis.  Raymondo Band,  MD [facility] [phone] pager    11/02/2020, 11:42 AM

## 2020-11-02 NOTE — Consult Note (Signed)
WOC Nurse ostomy follow up Patient receiving care in [facility] ED024 Stoma type/location: LLQ colostomy Stomal assessment/size:  Peristomal assessment:  Treatment options for stomal/peristomal skin: Barrier ring Ostomy pouching: 1pc.flat [place] # [address] Barrier ring [place] # [phone])  Thank you for the consult. WOC nurse will not follow at this time.   Please re-consult the WOC team if needed.  Renaldo Reel Katrinka Blazing, MSN, RN, CMSRN, Angus Seller, [facility] Wound Treatment Associate Pager [phone]

## 2020-11-03 ENCOUNTER — Inpatient Hospital Stay (HOSPITAL_COMMUNITY): Payer: BC Managed Care – PPO

## 2020-11-03 DIAGNOSIS — I2699 Other pulmonary embolism without acute cor pulmonale: Secondary | ICD-10-CM

## 2020-11-03 DIAGNOSIS — R0602 Shortness of breath: Secondary | ICD-10-CM

## 2020-11-03 DIAGNOSIS — I82409 Acute embolism and thrombosis of unspecified deep veins of unspecified lower extremity: Secondary | ICD-10-CM

## 2020-11-03 HISTORY — DX: Acute embolism and thrombosis of unspecified deep veins of unspecified lower extremity: I82.409

## 2020-11-03 LAB — BASIC METABOLIC PANEL
Anion gap: 7 (ref 5–15)
BUN: 20 mg/dL (ref 6–20)
CO2: 24 mmol/L (ref 22–32)
Calcium: 8.5 mg/dL — ABNORMAL LOW (ref 8.9–10.3)
Chloride: 103 mmol/L (ref 98–111)
Creatinine, Ser: 1.27 mg/dL — ABNORMAL HIGH (ref 0.61–1.24)
GFR, Estimated: >60 mL/min (ref >60)
Glucose, Bld: 84 mg/dL (ref 70–99)
Potassium: 3.8 mmol/L (ref 3.5–5.1)
Sodium: 134 mmol/L — ABNORMAL LOW (ref 135–145)

## 2020-11-03 LAB — CBC
HCT: 35.1 % — ABNORMAL LOW (ref 39.0–52.0)
Hemoglobin: 11.7 g/dL — ABNORMAL LOW (ref 13.0–17.0)
MCH: 32.1 pg (ref 26.0–34.0)
MCHC: 33.3 g/dL (ref 30.0–36.0)
MCV: 96.2 fL (ref 80.0–100.0)
Platelets: 158 10*3/uL (ref 150–400)
RBC: 3.65 MIL/uL — ABNORMAL LOW (ref 4.22–5.81)
RDW: 14.9 % (ref 11.5–15.5)
WBC: 6.4 10*3/uL (ref 4.0–10.5)
nRBC: 0 % (ref 0.0–0.2)

## 2020-11-03 LAB — GLUCOSE, CAPILLARY
Glucose-Capillary: 88 mg/dL (ref 70–99)
Glucose-Capillary: 93 mg/dL (ref 70–99)
Glucose-Capillary: 108 mg/dL — ABNORMAL HIGH (ref 70–99)
Glucose-Capillary: 133 mg/dL — ABNORMAL HIGH (ref 70–99)
Glucose-Capillary: 137 mg/dL — ABNORMAL HIGH (ref 70–99)

## 2020-11-03 LAB — RPR: RPR Ser Ql: NONREACTIVE

## 2020-11-03 LAB — HEPARIN LEVEL (UNFRACTIONATED)
Heparin Unfractionated: 0.38 IU/mL (ref 0.30–0.70)
Heparin Unfractionated: 0.16 IU/mL — ABNORMAL LOW (ref 0.30–0.70)

## 2020-11-03 LAB — MAGNESIUM: Magnesium: 1.5 mg/dL — ABNORMAL LOW (ref 1.7–2.4)

## 2020-11-03 LAB — PROCALCITONIN: Procalcitonin: 0.3 ng/mL

## 2020-11-03 MED ORDER — HEPARIN BOLUS VIA INFUSION
2400.0000 [IU] | Freq: Once | INTRAVENOUS | Status: DC
  Filled 2020-11-03: qty 2400

## 2020-11-03 MED ORDER — MAGNESIUM SULFATE 2 GM/50ML IV SOLN
2.0000 g | Freq: Once | INTRAVENOUS | Status: AC
  Administered 2020-11-03: 2 g via INTRAVENOUS
  Filled 2020-11-03: qty 50

## 2020-11-03 MED ORDER — ENOXAPARIN SODIUM 120 MG/0.8ML IJ SOSY
120.0000 mg | PREFILLED_SYRINGE | INTRAMUSCULAR | Status: DC
  Administered 2020-11-03 – 2020-11-05 (×3): 120 mg via SUBCUTANEOUS
  Filled 2020-11-03 (×3): qty 0.8

## 2020-11-03 NOTE — Progress Notes (Addendum)
ANTICOAGULATION CONSULT NOTE - Follow Up Consult  Pharmacy Consult for heparin Indication: pulmonary embolus  No Known Allergies  Patient Measurements: Height: 6\' 1"  (185.4 cm) Weight: 81.6 kg (180 lb) IBW/kg (Calculated) : 79.9 Heparin Dosing Weight: 81.6 kg  Vital Signs: Temp: 98 F (36.7 C) (08/21 0730) Temp Source: Oral (08/21 0730) BP: 121/86 (08/21 0730) Pulse Rate: 86 (08/21 0730)  Labs: Recent Labs    11/01/20 1656 11/01/20 1719 11/01/20 1926 11/02/20 0221 11/02/20 0500 11/02/20 1259 11/02/20 2040 11/03/20 0139  HGB 14.7 16.0  --  12.8*  --   --   --  11.7*  HCT 44.9 47.0  --  39.0  --   --   --  35.1*  PLT 124*  --   --  162  --   --   --  158  HEPARINUNFRC  --   --   --   --    < > 0.26* 0.29* 0.38  CREATININE 1.65*  --   --  1.52*  --   --   --  1.27*  TROPONINIHS  --   --  24* 12  --   --   --   --    < > = values in this interval not displayed.    Estimated Creatinine Clearance: 79.5 mL/min (A) (by C-G formula based on SCr of 1.27 mg/dL (H)).   Assessment: 29 YOM found to have an acute PE in the left lower pulmonary artery. Pharmacy consulted to dose IV heparin. He was initially subtherapeutic (heparin level 0.15) on heparin 1350 units/hr so he was given a rebolus of 2500 units and increased to heparin 1650 units/hr. Heparin levels continued to be subtherapeutic, so heparin rate was increased to 2000 units/hr. Heparin level this morning 0.38.  Confirmatory heparin level this afternoon is subtherapeutic at 0.16. Hgb 11.7 and PLT stable at 158. No issues with infusion noted by nurse.  Goal of Therapy:  Heparin level 0.3-0.7 units/ml Monitor platelets by anticoagulation protocol: Yes   Plan:  Rebolus heparin 2400 units/hr Increase heparin infusion to 2200 units/hr Monitor daily heparin level, CBC and s/s of bleeding   Thank you for including pharmacy in the care of this patient.  55, PharmD PGY1 Acute Care Pharmacy Resident  Phone:  [phone] 11/03/2020 1:56 PM   Please check [email or web address] for unit-specific pharmacy phone numbers.  __________________________ Addendum:   Heparin has been transitioned to Lovenox 1.5 mg/kg q24h. Oral anticoagulants are not preferred due to interaction with patient's Symtuza. Continue to monitor CBC and for s/sx of bleeding.  11/05/2020, PharmD PGY1 Acute Care Pharmacy Resident  Phone: [phone] 11/03/2020  2:51 PM  Please check [email or web address] for unit-specific pharmacy phone numbers.

## 2020-11-03 NOTE — Progress Notes (Signed)
PROGRESS NOTE  JAKSON DELPILAR ZOX:[id number] DOB: [birth year] DOA: 11/01/2020 PCP: Billey Co, MD   LOS: 2 days   Brief narrative: patient is a 50 years old male with past medical history of tuberculosis treated 20 years ago in [place], PJP pneumonia, HIV disease with CD4 count of 99 in September 2021, diabetes mellitus type 2 ran out of his medications for a month after which he started having cough congestion body aches and pleuritic chest pain with coughing.  In the ED patient was noted to be negative for COVID and white blood cell counts were within normal limits.  Temperature max was 101 F and was mildly tachycardic at 125.  Lactate was 1.4.  Pro-Cal was 0.4.  CTA of the chest was suggestive of atypical pneumonia/atypical TB with lymphadenopathy and there was note of segmental PE.  Patient was then admitted hospital for evaluation and treatment.  Infectious disease was consulted as well. Lower extremity duplex is consistent with DVT.   Assessment/Plan:  Principal Problem:   Acute respiratory failure with hypoxia ([name]) Active Problems:   HIV (human immunodeficiency virus infection) ([name])   Community acquired pneumonia   AIDS (acquired immune deficiency syndrome) ([name])   Acute pulmonary embolus ([name])   Type 2 diabetes mellitus ([name])   History of TB (tuberculosis)   History of Pneumocystis jirovecii pneumonia   CKD (chronic kidney disease) stage 3, GFR 30-59 ml/min ([name])   Acute pulmonary embolism ([name])   DVT (deep venous thrombosis) ([name])  Acute hypoxemic respiratory failure along with pleuritic chest pain and shortness of breath: Multifactorial. Possibilities of atypical pneumonia, history of PJP.  Possibility of viral pneumonia as well.  CT scan also shows significant scarring and emphysematous changes. Followed by infectious disease. Currently remains on Rocephin and azithromycin.  Cultures and Fungitell pending.  No suspicion of pulmonary tuberculosis, discontinue airborne  isolation.  Currently on 2 L of oxygen.  Mobilize.  Wean off to room air.  Left lower pulmonary  embolism and right lower extremity DVT: CTA of the chest showing segmental pulmonary embolism.  2D echocardiogram normal.  Duplexes with lower extremity DVTs.  Currently on heparin.  Changed to Eliquis.    HIV. HIV RNA and CD4 count has been sent.  HAART has been reinitiated.On Bactrim for prophylaxis.  Diabetes mellitus type 2. Continue to hold her Janumet.  Continue sliding scale insulin Accu-Cheks diabetic diet  CKD stage IIIa. Improved to baseline.  Hypomagnesemia: Replace and monitor.  DVT prophylaxis:    Eliquis.   Code Status: Full  Family Communication: None  Status is: Inpatient  Remains inpatient appropriate because:IV treatments appropriate due to intensity of illness or inability to take PO and Inpatient level of care appropriate due to severity of illness  Dispo: The patient is from: Home              Anticipated d/c is to: Home              Patient currently is not medically stable to d/c.   Difficult to place patient No   Consultants: Infectious disease  Procedures: None  Anti-infectives:  Rocephin Zithromax ART  Anti-infectives (From admission, onward)    Start     Dose/Rate Route Frequency Ordered Stop   11/03/20 0800  Darunavir-Cobicistat-Emtricitabine-Tenofovir Alafenamide (SYMTUZA) 800-150-200-10 MG TABS 1 tablet        1 tablet Oral Daily with breakfast 11/02/20 1201     11/02/20 1300  cefTRIAXone (ROCEPHIN) 2 g in sodium chloride 0.9 % 100  mL IVPB        2 g 200 mL/hr over 30 Minutes Intravenous Every 24 hours 11/02/20 1148     11/02/20 1300  dolutegravir (TIVICAY) tablet 50 mg        50 mg Oral Daily 11/02/20 1201     11/02/20 1245  azithromycin (ZITHROMAX) tablet 500 mg        500 mg Oral Daily 11/02/20 1148 11/07/20 0959   11/02/20 1000  vancomycin (VANCOCIN) IVPB 1000 mg/200 mL premix  Status:  Discontinued        1,000 mg 200 mL/hr  over 60 Minutes Intravenous Every 12 hours 11/01/20 2014 11/02/20 1148   11/02/20 1000  sulfamethoxazole-trimethoprim (BACTRIM DS) 800-160 MG per tablet 1 tablet        1 tablet Oral Daily 11/01/20 2148     11/02/20 0600  ceFEPIme (MAXIPIME) 2 g in sodium chloride 0.9 % 100 mL IVPB  Status:  Discontinued        2 g 200 mL/hr over 30 Minutes Intravenous Every 8 hours 11/01/20 2014 11/02/20 1148   11/01/20 1930  vancomycin (VANCOREADY) IVPB 1750 mg/350 mL        1,750 mg 175 mL/hr over 120 Minutes Intravenous  Once 11/01/20 1851 11/02/20 0212   11/01/20 1900  ceFEPIme (MAXIPIME) 2 g in sodium chloride 0.9 % 100 mL IVPB        2 g 200 mL/hr over 30 Minutes Intravenous  Once 11/01/20 1851 11/01/20 2023   11/01/20 1900  metroNIDAZOLE (FLAGYL) IVPB 500 mg        500 mg 100 mL/hr over 60 Minutes Intravenous  Once 11/01/20 1851 11/01/20 2132      Subjective: Patient seen and examined.  Today denies any problems.  Chest pain has mostly improved.  Able to take deep breaths in. Remains afebrile.  On 2 L oxygen.  Objective: Vitals:   11/03/20 0730 11/03/20 1049  BP: 121/86 128/90  Pulse: 86 86  Resp: 20 19  Temp: 98 F (36.7 C) 98.1 F (36.7 C)  SpO2: 92% 92%    Intake/Output Summary (Last 24 hours) at 11/03/2020 1423 Last data filed at 11/03/2020 1050 Gross per 24 hour  Intake 240 ml  Output 1250 ml  Net -1010 ml   Filed Weights   11/01/20 1654  Weight: 81.6 kg   Body mass index is 23.75 kg/m.   Physical Exam: GENERAL: Patient is alert awake and oriented. Not in obvious distress.   HENT: No scleral pallor or icterus. Pupils equally reactive to light. Oral mucosa is moist, on nasal cannula oxygen 2 L/min NECK: is supple, no gross swelling noted. CHEST: Decreased breath sounds bilaterally.  No other added sounds. CVS: S1 and S2 heard, no murmur. Regular rate and rhythm.  ABDOMEN: Soft, non-tender, bowel sounds are present.  Left lower quadrant colostomy. EXTREMITIES: No  edema. CNS: Cranial nerves are intact. No focal motor deficits. SKIN: warm and dry without rashes.  Data Review: I have personally reviewed the following laboratory data and studies,  CBC: Recent Labs  Lab 11/01/20 1656 11/01/20 1719 11/02/20 0221 11/03/20 0139  WBC 8.4  --  7.9 6.4  NEUTROABS 5.8  --   --   --   HGB 14.7 16.0 12.8* 11.7*  HCT 44.9 47.0 39.0 35.1*  MCV 97.0  --  96.5 96.2  PLT 124*  --  162 158   Basic Metabolic Panel: Recent Labs  Lab 11/01/20 1656 11/01/20 1719 11/02/20 0221 11/03/20 0139  NA 134* 138 131* 134*  K 4.4 4.1 3.5 3.8  CL 99  --  102 103  CO2 23  --  22 24  GLUCOSE 171*  --  98 84  BUN 27*  --  24* 20  CREATININE 1.65*  --  1.52* 1.27*  CALCIUM 9.2  --  8.4* 8.5*  MG  --   --   --  1.5*   Liver Function Tests: Recent Labs  Lab 11/01/20 1656  AST 36  ALT 29  ALKPHOS 55  BILITOT 0.9  PROT 10.7*  ALBUMIN 2.5*   Recent Labs  Lab 11/01/20 1656  LIPASE 45   No results for input(s): AMMONIA in the last 168 hours. Cardiac Enzymes: No results for input(s): CKTOTAL, CKMB, CKMBINDEX, TROPONINI in the last 168 hours. BNP (last 3 results) No results for input(s): BNP in the last 8760 hours.  ProBNP (last 3 results) No results for input(s): PROBNP in the last 8760 hours.  CBG: Recent Labs  Lab 11/02/20 2043 11/02/20 2352 11/03/20 0345 11/03/20 0729 11/03/20 1047  GLUCAP 126* 85 88 93 108*   Recent Results (from the past 240 hour(s))  Resp Panel by RT-PCR (Flu A&B, Covid) Nasopharyngeal Swab     Status: None   Collection Time: 11/01/20  5:02 PM   Specimen: Nasopharyngeal Swab; Nasopharyngeal(NP) swabs in vial transport medium  Result Value Ref Range Status   SARS Coronavirus 2 by RT PCR NEGATIVE NEGATIVE Final    Comment: (NOTE) SARS-CoV-2 target nucleic acids are NOT DETECTED.  The SARS-CoV-2 RNA is generally detectable in upper respiratory specimens during the acute phase of infection. The lowest concentration of  SARS-CoV-2 viral copies this assay can detect is 138 copies/mL. A negative result does not preclude SARS-Cov-2 infection and should not be used as the sole basis for treatment or other patient management decisions. A negative result may occur with  improper specimen collection/handling, submission of specimen other than nasopharyngeal swab, presence of viral mutation(s) within the areas targeted by this assay, and inadequate number of viral copies(<138 copies/mL). A negative result must be combined with clinical observations, patient history, and epidemiological information. The expected result is Negative.  Fact Sheet for Patients:  BloggerCourse.com  Fact Sheet for Healthcare Providers:  SeriousBroker.it  This test is no t yet approved or cleared by the [place] FDA and  has been authorized for detection and/or diagnosis of SARS-CoV-2 by FDA under an Emergency Use Authorization (EUA). This EUA will remain  in effect (meaning this test can be used) for the duration of the COVID-19 declaration under Section 564(b)(1) of the Act, 21 U.S.C.section 360bbb-3(b)(1), unless the authorization is terminated  or revoked sooner.       Influenza A by PCR NEGATIVE NEGATIVE Final   Influenza B by PCR NEGATIVE NEGATIVE Final    Comment: (NOTE) The Xpert Xpress SARS-CoV-2/FLU/RSV plus assay is intended as an aid in the diagnosis of influenza from Nasopharyngeal swab specimens and should not be used as a sole basis for treatment. Nasal washings and aspirates are unacceptable for Xpert Xpress SARS-CoV-2/FLU/RSV testing.  Fact Sheet for Patients: BloggerCourse.com  Fact Sheet for Healthcare Providers: SeriousBroker.it  This test is not yet approved or cleared by the [place] FDA and has been authorized for detection and/or diagnosis of SARS-CoV-2 by FDA under an Emergency Use  Authorization (EUA). This EUA will remain in effect (meaning this test can be used) for the duration of the COVID-19 declaration under Section 564(b)(1) of the Act,  21 U.S.C. section 360bbb-3(b)(1), unless the authorization is terminated or revoked.  Performed at [facility], [address] [place], [place] [address]   Blood culture (routine x 2)     Status: None (Preliminary result)   Collection Time: 11/01/20  7:26 PM   Specimen: BLOOD  Result Value Ref Range Status   Specimen Description BLOOD RIGHT ANTECUBITAL  Final   Special Requests   Final    BOTTLES DRAWN AEROBIC AND ANAEROBIC Blood Culture adequate volume   Culture   Final    NO GROWTH 1 DAY Performed at [facility], [address] [place], [place] [address]    Report Status PENDING  Incomplete  Blood culture (routine x 2)     Status: None (Preliminary result)   Collection Time: 11/01/20  7:26 PM   Specimen: BLOOD LEFT HAND  Result Value Ref Range Status   Specimen Description BLOOD LEFT HAND  Final   Special Requests   Final    BOTTLES DRAWN AEROBIC AND ANAEROBIC Blood Culture adequate volume   Culture   Final    NO GROWTH 1 DAY Performed at [facility], [address], [place], [place] [address]    Report Status PENDING  Incomplete  Expectorated Sputum Assessment w Gram Stain, Rflx to Resp Cult     Status: None   Collection Time: 11/01/20 11:57 PM   Specimen: Expectorated Sputum  Result Value Ref Range Status   Specimen Description EXPECTORATED SPUTUM  Final   Special Requests NONE  Final   Sputum evaluation   Final    THIS SPECIMEN IS ACCEPTABLE FOR SPUTUM CULTURE Performed at [facility], [address] [place], [place] [address]    Report Status 11/02/2020 FINAL  Final  Culture, Respiratory w Gram Stain     Status: None (Preliminary result)   Collection Time: 11/01/20 11:57 PM  Result Value Ref Range Status   Specimen Description EXPECTORATED SPUTUM  Final   Special  Requests NONE Reflexed from [id number]  Final   Gram Stain   Final    RARE WBC PRESENT,BOTH PMN AND MONONUCLEAR MODERATE GRAM NEGATIVE RODS MODERATE GRAM POSITIVE COCCI IN PAIRS    Culture   Final    CULTURE REINCUBATED FOR BETTER GROWTH Performed at [facility], [address], [place], [place] [address]    Report Status PENDING  Incomplete  MRSA Next Gen by PCR, Nasal     Status: None   Collection Time: 11/01/20 11:59 PM   Specimen: Nasal Mucosa; Nasal Swab  Result Value Ref Range Status   MRSA by PCR Next Gen NOT DETECTED NOT DETECTED Final    Comment: (NOTE) The GeneXpert MRSA Assay (FDA approved for NASAL specimens only), is one component of a comprehensive MRSA colonization surveillance program. It is not intended to diagnose MRSA infection nor to guide or monitor treatment for MRSA infections. Test performance is not FDA approved in patients less than 16 years old. Performed at [facility], [address] [place], [place] [address]      Studies: DG Chest 2 View  Result Date: 11/01/2020 CLINICAL DATA:  Cough and fever.  Shortness of breath. EXAM: CHEST - 2 VIEW COMPARISON:  05/17/2019 FINDINGS: The heart size and mediastinal contours are within normal limits. Extensive scarring in left mid and lower lung again seen. Emphysematous changes again noted with biapical pleural-parenchymal scarring. No new or worsening areas of pulmonary opacity are seen. No evidence of  pleural effusion. IMPRESSION: Stable extensive left lung scarring and emphysema. No acute findings. Electronically Signed   By: Danae Orleans M.D.   On: 11/01/2020 17:57   CT Angio Chest PE W and/or Wo Contrast  Result Date: 11/01/2020 CLINICAL DATA:  Pulmonary embolus suspected. High probability. Chest pain, shortness of breath, tachycardia. Abdominal pain, acute nonlocalized. Generalized. Fever. Concern for infection. EXAM: CT ANGIOGRAPHY CHEST CT ABDOMEN AND PELVIS WITH CONTRAST TECHNIQUE: Multidetector  CT imaging of the chest was performed using the standard protocol during bolus administration of intravenous contrast. Multiplanar CT image reconstructions and MIPs were obtained to evaluate the vascular anatomy. Multidetector CT imaging of the abdomen and pelvis was performed using the standard protocol during bolus administration of intravenous contrast. CONTRAST:  OMNIPAQUE IOHEXOL 350 MG/ML SOLN COMPARISON:  CT a chest 05/17/2019 FINDINGS: CTA CHEST FINDINGS Cardiovascular: There is moderately good opacification of the central and segmental pulmonary arteries. There is overall decreased opacification of the left lower lung pulmonary arteries in comparison to the right and there is a small linear filling defect in the left lower lobe pulmonary artery. This is probably a small focal pulmonary embolus. No other emboli are identified. Normal caliber thoracic aorta without evidence of dissection. Normal heart size. No pericardial effusions. Mediastinum/Nodes: Small esophageal hiatal hernia. Esophagus is decompressed. Mediastinal and hilar lymphadenopathy with right hilar lymph nodes measuring up to 2.4 cm diameter and left pretracheal nodes measuring up to 1.5 cm diameter. Lymph nodes appear to be increasing in size since the previous study. Mild right axillary lymphadenopathy with nodes measuring 11 mm short axis dimension. Lungs/Pleura: There is evidence of chronic lung disease with emphysematous changes, apical scarring, cystic and cylindrical bronchiectasis with bronchial wall thickening and either intraluminal secretions or mucous plugging. Patchy peribronchial infiltrates could represent bronchopneumonia or chronic inflammatory process. Some of the ground-glass changes seen previously in the left lingula are improved since previous study but there is increasing change in the left lower lung. Musculoskeletal: No chest wall abnormality. No acute or significant osseous findings. Review of the MIP images  confirms the above findings. CT ABDOMEN and PELVIS FINDINGS Hepatobiliary: Mild fatty infiltration of the liver. No focal lesions. Gallbladder and bile ducts are unremarkable. Pancreas: Unremarkable. No pancreatic ductal dilatation or surrounding inflammatory changes. Spleen: Normal in size without focal abnormality. Adrenals/Urinary Tract: Adrenal glands are unremarkable. Kidneys are normal, without renal calculi, focal lesion, or hydronephrosis. Bladder is unremarkable. Stomach/Bowel: Left lower quadrant diverting colostomy. Peristomal hernia containing fat. Stomach, small bowel, and colon are not abnormally distended. Scattered stool throughout the colon. No inflammatory changes are appreciated. Appendix is not identified. Vascular/Lymphatic: No significant lymphadenopathy. Scattered aortic calcification. No aneurysm. Reproductive: Prostate is unremarkable. Other: No free air or free fluid in the abdomen. Musculoskeletal: No acute or significant osseous findings. Review of the MIP images confirms the above findings. IMPRESSION: 1. Focal pulmonary embolus in the left lower lung pulmonary artery. 2. Chronic lung disease with emphysematous change, bronchiectasis, peribronchial thickening, and intraluminal bronchial secretions/mucus plugging. Patchy peribronchial infiltrates waxing and waning since prior study. Changes could indicate atypical pneumonia or TB. 3. Hilar and mediastinal lymphadenopathy, progressing since the previous study and probably reactive although nonspecific. 4. No acute process demonstrated in the abdomen or pelvis. 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 5. Small esophageal hiatal hernia.  Mild aortic atherosclerosis. Critical Value/emergent results were called by telephone at the time of interpretation on 11/01/2020 at 8:46 pm to provider [facility] , who verbally acknowledged these  results. Electronically Signed   By: Burman Nieves M.D.   On: 11/01/2020  20:50   CT ABDOMEN PELVIS W CONTRAST  Result Date: 11/01/2020 CLINICAL DATA:  Pulmonary embolus suspected. High probability. Chest pain, shortness of breath, tachycardia. Abdominal pain, acute nonlocalized. Generalized. Fever. Concern for infection. EXAM: CT ANGIOGRAPHY CHEST CT ABDOMEN AND PELVIS WITH CONTRAST TECHNIQUE: Multidetector CT imaging of the chest was performed using the standard protocol during bolus administration of intravenous contrast. Multiplanar CT image reconstructions and MIPs were obtained to evaluate the vascular anatomy. Multidetector CT imaging of the abdomen and pelvis was performed using the standard protocol during bolus administration of intravenous contrast. CONTRAST:  OMNIPAQUE IOHEXOL 350 MG/ML SOLN COMPARISON:  CT a chest 05/17/2019 FINDINGS: CTA CHEST FINDINGS Cardiovascular: There is moderately good opacification of the central and segmental pulmonary arteries. There is overall decreased opacification of the left lower lung pulmonary arteries in comparison to the right and there is a small linear filling defect in the left lower lobe pulmonary artery. This is probably a small focal pulmonary embolus. No other emboli are identified. Normal caliber thoracic aorta without evidence of dissection. Normal heart size. No pericardial effusions. Mediastinum/Nodes: Small esophageal hiatal hernia. Esophagus is decompressed. Mediastinal and hilar lymphadenopathy with right hilar lymph nodes measuring up to 2.4 cm diameter and left pretracheal nodes measuring up to 1.5 cm diameter. Lymph nodes appear to be increasing in size since the previous study. Mild right axillary lymphadenopathy with nodes measuring 11 mm short axis dimension. Lungs/Pleura: There is evidence of chronic lung disease with emphysematous changes, apical scarring, cystic and cylindrical bronchiectasis with bronchial wall thickening and either intraluminal secretions or mucous plugging. Patchy peribronchial  infiltrates could represent bronchopneumonia or chronic inflammatory process. Some of the ground-glass changes seen previously in the left lingula are improved since previous study but there is increasing change in the left lower lung. Musculoskeletal: No chest wall abnormality. No acute or significant osseous findings. Review of the MIP images confirms the above findings. CT ABDOMEN and PELVIS FINDINGS Hepatobiliary: Mild fatty infiltration of the liver. No focal lesions. Gallbladder and bile ducts are unremarkable. Pancreas: Unremarkable. No pancreatic ductal dilatation or surrounding inflammatory changes. Spleen: Normal in size without focal abnormality. Adrenals/Urinary Tract: Adrenal glands are unremarkable. Kidneys are normal, without renal calculi, focal lesion, or hydronephrosis. Bladder is unremarkable. Stomach/Bowel: Left lower quadrant diverting colostomy. Peristomal hernia containing fat. Stomach, small bowel, and colon are not abnormally distended. Scattered stool throughout the colon. No inflammatory changes are appreciated. Appendix is not identified. Vascular/Lymphatic: No significant lymphadenopathy. Scattered aortic calcification. No aneurysm. Reproductive: Prostate is unremarkable. Other: No free air or free fluid in the abdomen. Musculoskeletal: No acute or significant osseous findings. Review of the MIP images confirms the above findings. IMPRESSION: 1. Focal pulmonary embolus in the left lower lung pulmonary artery. 2. Chronic lung disease with emphysematous change, bronchiectasis, peribronchial thickening, and intraluminal bronchial secretions/mucus plugging. Patchy peribronchial infiltrates waxing and waning since prior study. Changes could indicate atypical pneumonia or TB. 3. Hilar and mediastinal lymphadenopathy, progressing since the previous study and probably reactive although nonspecific. 4. No acute process demonstrated in the abdomen or pelvis. Left lower quadrant colostomy with  peristomal hernia containing fat. 5. Small esophageal hiatal hernia.  Mild aortic atherosclerosis. Critical Value/emergent results were called by telephone at the time of interpretation on 11/01/2020 at 8:46 pm to provider [facility] , who verbally acknowledged these results. Electronically Signed   By: Burman Nieves M.D.   On: 11/01/2020  20:50   ECHOCARDIOGRAM COMPLETE  Result Date: 11/02/2020    ECHOCARDIOGRAM REPORT   Patient Name:   Antonieta IbaJEAN-PAUL P Nitsch Date of Exam: 11/02/2020 Medical Rec #:  [id number]          Height:       73.0 in Accession #:    [id number]         Weight:       180.0 lb Date of Birth:  02/13/1971           BSA:          2.057 m Patient Age:    50 years           BP:           107/67 mmHg Patient Gender: M                  HR:           96 bpm. Exam Location:  Inpatient Procedure: 2D Echo, Cardiac Doppler and Color Doppler Indications:    I26.02 Pulmonary embolus  History:        Patient has prior history of Echocardiogram examinations, most                 recent 05/23/2019. COPD. Persistent coughing. H/O TB. HIV.  Sonographer:    Roosvelt Maserachel Lane RDCS Referring Phys: [phone] JARED M GARDNER IMPRESSIONS  1. Left ventricular ejection fraction, by estimation, is 45 to 50%. The left ventricle has mildly decreased function. The left ventricle demonstrates global hypokinesis. Left ventricular diastolic parameters are indeterminate.  2. Right ventricular systolic function is moderately reduced. RV free wall hypokinesis. The right ventricular size is normal. There is mildly elevated pulmonary artery systolic pressure. The estimated right ventricular systolic pressure is 35.3 mmHg.  3. The mitral valve is normal in structure. Trivial mitral valve regurgitation.  4. The aortic valve was not well visualized. Aortic valve regurgitation is not visualized. No aortic stenosis is present.  5. The inferior vena cava is normal in size with greater than 50% respiratory variability, suggesting right atrial  pressure of 3 mmHg. FINDINGS  Left Ventricle: Left ventricular ejection fraction, by estimation, is 45 to 50%. The left ventricle has mildly decreased function. The left ventricle demonstrates global hypokinesis. The left ventricular internal cavity size was normal in size. There is  no left ventricular hypertrophy. Left ventricular diastolic parameters are indeterminate. Right Ventricle: The right ventricular size is normal. Right vetricular wall thickness was not well visualized. Right ventricular systolic function is moderately reduced. There is mildly elevated pulmonary artery systolic pressure. The tricuspid regurgitant velocity is 2.84 m/s, and with an assumed right atrial pressure of 3 mmHg, the estimated right ventricular systolic pressure is 35.3 mmHg. Left Atrium: Left atrial size was normal in size. Right Atrium: Right atrial size was normal in size. Pericardium: There is no evidence of pericardial effusion. Mitral Valve: The mitral valve is normal in structure. Trivial mitral valve regurgitation. Tricuspid Valve: The tricuspid valve is normal in structure. Tricuspid valve regurgitation is trivial. Aortic Valve: The aortic valve was not well visualized. Aortic valve regurgitation is not visualized. No aortic stenosis is present. Aortic valve mean gradient measures 6.0 mmHg. Aortic valve peak gradient measures 9.6 mmHg. Aortic valve area, by VTI measures 2.05 cm. Pulmonic Valve: The pulmonic valve was not well visualized. Pulmonic valve regurgitation is not visualized. Aorta: The aortic root is normal in size and structure. Venous: The inferior vena cava is normal in size with greater  than 50% respiratory variability, suggesting right atrial pressure of 3 mmHg. IAS/Shunts: The interatrial septum was not well visualized.  LEFT VENTRICLE PLAX 2D LVIDd:         5.00 cm LVIDs:         3.60 cm LV PW:         1.00 cm LV IVS:        0.80 cm LVOT diam:     2.20 cm LV SV:         56 LV SV Index:   27 LVOT Area:      3.80 cm  LV Volumes (MOD) LV vol d, MOD A2C: 86.0 ml LV vol d, MOD A4C: 86.9 ml LV vol s, MOD A2C: 41.6 ml LV vol s, MOD A4C: 43.0 ml LV SV MOD A2C:     44.4 ml LV SV MOD A4C:     86.9 ml LV SV MOD BP:      42.8 ml RIGHT VENTRICLE RV Basal diam:  2.90 cm RV S prime:     8.81 cm/s TAPSE (M-mode): 1.9 cm LEFT ATRIUM           Index       RIGHT ATRIUM           Index LA diam:      3.40 cm 1.65 cm/m  RA Area:     15.60 cm LA Vol (A2C): 44.9 ml 21.82 ml/m RA Volume:   42.80 ml  20.80 ml/m  AORTIC VALVE AV Area (Vmax):    2.10 cm AV Area (Vmean):   2.00 cm AV Area (VTI):     2.05 cm AV Vmax:           155.00 cm/s AV Vmean:          114.000 cm/s AV VTI:            0.272 m AV Peak Grad:      9.6 mmHg AV Mean Grad:      6.0 mmHg LVOT Vmax:         85.70 cm/s LVOT Vmean:        60.000 cm/s LVOT VTI:          0.147 m LVOT/AV VTI ratio: 0.54  AORTA Ao Root diam: 3.20 cm MITRAL VALVE               TRICUSPID VALVE MV Area (PHT): 4.36 cm    TR Peak grad:   32.3 mmHg MV Decel Time: 174 msec    TR Vmax:        284.00 cm/s MV E velocity: 60.40 cm/s MV A velocity: 61.30 cm/s  SHUNTS MV E/A ratio:  0.99        Systemic VTI:  0.15 m                            Systemic Diam: 2.20 cm Epifanio Lesches MD Electronically signed by Epifanio Lesches MD Signature Date/Time: 11/02/2020/2:05:48 PM    Final    VAS [place] LOWER EXTREMITY VENOUS (DVT)  Result Date: 11/03/2020  Lower Venous DVT Study Patient Name:  DAVARIUS RIDENER  Date of Exam:   11/03/2020 Medical Rec #: [id number]           Accession #:    [id number] Date of Birth: [birth year]            Patient Gender: M Patient Age:   18 years Exam Location:  [facility] Procedure:      VAS [place] LOWER EXTREMITY VENOUS (DVT) Referring Phys: JARED GARDNER --------------------------------------------------------------------------------  Indications: SOB, and pulmonary embolism.  Risk Factors: History of tuberculosis. HIV/AIDS. Comparison Study: Prior negative bilateral LEV done  02/25/2016 Performing Technologist: Sherren Kerns RVS  Examination Guidelines: A complete evaluation includes B-mode imaging, spectral Doppler, color Doppler, and power Doppler as needed of all accessible portions of each vessel. Bilateral testing is considered an integral part of a complete examination. Limited examinations for reoccurring indications may be performed as noted. The reflux portion of the exam is performed with the patient in reverse Trendelenburg.  +---------+---------------+---------+-----------+----------+--------------+ RIGHT    CompressibilityPhasicitySpontaneityPropertiesThrombus Aging +---------+---------------+---------+-----------+----------+--------------+ CFV      Full           Yes      Yes                  Rouleaux flow  +---------+---------------+---------+-----------+----------+--------------+ SFJ      Full                                                        +---------+---------------+---------+-----------+----------+--------------+ FV Prox  Full                                                        +---------+---------------+---------+-----------+----------+--------------+ FV Mid   Full                                                        +---------+---------------+---------+-----------+----------+--------------+ FV DistalFull                                                        +---------+---------------+---------+-----------+----------+--------------+ PFV      Full                                                        +---------+---------------+---------+-----------+----------+--------------+ POP      Full           Yes      Yes                                 +---------+---------------+---------+-----------+----------+--------------+ PTV      None                                         Acute          +---------+---------------+---------+-----------+----------+--------------+ PERO     None  Acute          +---------+---------------+---------+-----------+----------+--------------+   +---------+---------------+---------+-----------+----------+--------------+ LEFT     CompressibilityPhasicitySpontaneityPropertiesThrombus Aging +---------+---------------+---------+-----------+----------+--------------+ CFV      Full           Yes      Yes                                 +---------+---------------+---------+-----------+----------+--------------+ SFJ      Full                                                        +---------+---------------+---------+-----------+----------+--------------+ FV Prox  Full                                                        +---------+---------------+---------+-----------+----------+--------------+ FV Mid   Full                                                        +---------+---------------+---------+-----------+----------+--------------+ FV DistalFull                                                        +---------+---------------+---------+-----------+----------+--------------+ PFV      Full                                                        +---------+---------------+---------+-----------+----------+--------------+ POP      Full           Yes      Yes                                 +---------+---------------+---------+-----------+----------+--------------+ PTV      Full                                                        +---------+---------------+---------+-----------+----------+--------------+ PERO     Full                                                        +---------+---------------+---------+-----------+----------+--------------+    Summary: RIGHT: - Findings consistent with acute deep vein thrombosis involving the right posterior tibial veins, and right peroneal veins. - No cystic structure found in the popliteal fossa. - Ultrasound  characteristics of  enlarged lymph nodes are noted in the groin.  LEFT: - There is no evidence of deep vein thrombosis in the lower extremity.  - No cystic structure found in the popliteal fossa. - Ultrasound characteristics of enlarged lymph nodes noted in the groin.  *See table(s) above for measurements and observations.    Preliminary       Dorcas Carrow, MD  [facility] 11/03/2020  If 7PM-7AM, please contact night-coverage  Total time spent: 30 minutes

## 2020-11-03 NOTE — Progress Notes (Signed)
[facility]  Date of Admission:  11/01/2020     CC: Sepsis/aids  Abx: 8/20-c ceftriaxone 8/20-c azithromycin 8/19-c bactrim ds prophy dose   8/19 vanc/cefepime                                                        Assessment: Sepsis Flu like illness History pulmonary tb treated Aids - not currently on medication Chest imaging emphysemia/scarring   50 yo male aids and hx distant tb s/p tx (>9yrs ago), s/p colostomy, off hiv meds 1-2 months here with a few days flu like illness and dry cough/mild hypoxemic resp distress and incidental PE on chest ct     #flu like illness #sepsis #mild hypoxic resp failure Reviewed chest ct -- improved from 2021; signifnicant scarring and emphysematous change Suspect viral illness uri, but will screen for pjp Doubt tb reinfection  He is only on 2 liters o2 and already better. Not currently on prednisone -- can defer unless worsenening dyspnea/hypoxemia Urine strep ag negative Ldh level normal Serum crypto ag negative Bcx ngtd  Pending: legionella ag, fungitell, pjp smear, sputum culture, urine histo ag. Patient clinically improving. Low suspicion for active pjp infection. Suspect nonspecific viral illness/cap.   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 Ran out of meds and not currently taking for a few months Previous cd4 99 several months ago. Suspect still < 200 or 15% Screen for endemic fungi/other fungal OI Serum cryptoAg negative -- could restart hiv meds Depending on cd4 count could send for other OI w/u for lymphadenitis subsequently. Certainly lymphoma could be at any cd4 count.    #hx tb Treated >20 years ago in [place] Reviewed ct chest last year and this admission. Do not suspect tb based on that and clinical history     #hcm check syphilis - rpr nonreactive (08/2019 reactive s/p treatment; fta also positive 08/2019)   #protein/albumin  dissociation Will send basic MM screen. Suspect polyclonal immunoglobulin process due to aids though, but MM screen prudent   #pe #right LE dvt Started on anticoagulation per primary team   Plan: F/u urine legionella, urine histo, fungitell, resp cx F/u hiv viral load and cd4 Finish 5 days abx for cap --> can transition to PO cefdinir and azithromycin Error on previous note -- patient was on prophy bactrim dosing, not treatment dose --> will continue prophy dose Continue home ART symtuza and tivicay Pending CD4 level/hiv viral load incoming ID team would decide further workup and arrange follow up with Dr Drue Second  I spent more than 35 minute reviewing data/chart, and coordinating care and >50% direct face to face time providing counseling/discussing diagnostics/treatment plan with patient   Principal Problem:   Acute respiratory failure with hypoxia ([name]) Active Problems:   HIV (human immunodeficiency virus infection) ([name])   Community acquired pneumonia   AIDS (acquired immune deficiency syndrome) ([name])   Acute pulmonary embolus ([name])   Type 2 diabetes mellitus ([name])   History of TB (tuberculosis)   History of Pneumocystis jirovecii pneumonia   CKD (chronic kidney disease) stage 3, GFR 30-59 ml/min ([name])   Acute pulmonary embolism ([name])   DVT (deep venous thrombosis) ([name])   No Known Allergies  Scheduled Meds:  azithromycin  500 mg Oral Daily   Darunavir-Cobicistat-Emtricitabine-Tenofovir Alafenamide  1 tablet Oral Q breakfast   dolutegravir  50 mg Oral Daily   insulin aspart  0-9 Units Subcutaneous Q4H   sulfamethoxazole-trimethoprim  1 tablet Oral Daily   Continuous Infusions:  cefTRIAXone (ROCEPHIN)  IV 2 g (11/03/20 1320)   magnesium sulfate bolus IVPB     PRN Meds:.acetaminophen **OR** acetaminophen, morphine injection, ondansetron **OR** ondansetron (ZOFRAN) IV   SUBJECTIVE: Feeling overall better Still moderate dry cough; less dyspneic No  n/v/diarrhea-increased osteomy output/headache  Duplex u/s bilateral LE also showed right LE dvt  No fever   Review of Systems: ROS All other ROS was negative, except mentioned above     OBJECTIVE: Vitals:   11/03/20 0000 11/03/20 0500 11/03/20 0730 11/03/20 1049  BP: 126/86 116/85 121/86 128/90  Pulse: 90 85 86 86  Resp: (!) 22 (!) 22 20 19   Temp: 99.1 F (37.3 C) 98.8 F (37.1 C) 98 F (36.7 C) 98.1 F (36.7 C)  TempSrc: Oral Oral Oral Oral  SpO2: 95% 94% 92% 92%  Weight:      Height:       Body mass index is 23.75 kg/m.  Physical Exam General/constitutional: no distress, pleasant HEENT: Normocephalic, PER, Conj Clear, oropharynx clear Neck supple CV: rrr no mrg Lungs: normal effort; scattered upper airway sound Abd: Soft, Nontender; LLQ osteomy functioning Ext: trace bilateral le edema Skin: No Rash Neuro: nonfocal MSK: no peripheral joint swelling/tenderness/warmth; back spines nontender   Central line presence: no  Lab Results Lab Results  Component Value Date   WBC 6.4 11/03/2020   HGB 11.7 (L) 11/03/2020   HCT 35.1 (L) 11/03/2020   MCV 96.2 11/03/2020   PLT 158 11/03/2020    Lab Results  Component Value Date   CREATININE 1.27 (H) 11/03/2020   BUN 20 11/03/2020   NA 134 (L) 11/03/2020   K 3.8 11/03/2020   CL 103 11/03/2020   CO2 24 11/03/2020    Lab Results  Component Value Date   ALT 29 11/01/2020   AST 36 11/01/2020   ALKPHOS 55 11/01/2020   BILITOT 0.9 11/01/2020      Microbiology: Recent Results (from the past 240 hour(s))  Resp Panel by RT-PCR (Flu A&B, Covid) Nasopharyngeal Swab     Status: None   Collection Time: 11/01/20  5:02 PM   Specimen: Nasopharyngeal Swab; Nasopharyngeal(NP) swabs in vial transport medium  Result Value Ref Range Status   SARS Coronavirus 2 by RT PCR NEGATIVE NEGATIVE Final    Comment: (NOTE) SARS-CoV-2 target nucleic acids are NOT DETECTED.  The SARS-CoV-2 RNA is generally detectable in upper  respiratory specimens during the acute phase of infection. The lowest concentration of SARS-CoV-2 viral copies this assay can detect is 138 copies/mL. A negative result does not preclude SARS-Cov-2 infection and should not be used as the sole basis for treatment or other patient management decisions. A negative result may occur with  improper specimen collection/handling, submission of specimen other than nasopharyngeal swab, presence of viral mutation(s) within the areas targeted by this assay, and inadequate number of viral copies(<138 copies/mL). A negative result must be combined with clinical observations, patient history, and epidemiological information. The expected result is Negative.  Fact Sheet for Patients:  11/03/20  Fact Sheet for Healthcare Providers:  BloggerCourse.com  This test is no t yet approved or cleared by the [place] FDA and  has been authorized for detection and/or diagnosis of SARS-CoV-2 by FDA under an Emergency Use  Authorization (EUA). This EUA will remain  in effect (meaning this test can be used) for the duration of the COVID-19 declaration under Section 564(b)(1) of the Act, 21 U.S.C.section 360bbb-3(b)(1), unless the authorization is terminated  or revoked sooner.       Influenza A by PCR NEGATIVE NEGATIVE Final   Influenza B by PCR NEGATIVE NEGATIVE Final    Comment: (NOTE) The Xpert Xpress SARS-CoV-2/FLU/RSV plus assay is intended as an aid in the diagnosis of influenza from Nasopharyngeal swab specimens and should not be used as a sole basis for treatment. Nasal washings and aspirates are unacceptable for Xpert Xpress SARS-CoV-2/FLU/RSV testing.  Fact Sheet for Patients: BloggerCourse.comhttps://www.fda.gov/media/152166/download  Fact Sheet for Healthcare Providers: SeriousBroker.ithttps://www.fda.gov/media/152162/download  This test is not yet approved or cleared by the [place] FDA and has been  authorized for detection and/or diagnosis of SARS-CoV-2 by FDA under an Emergency Use Authorization (EUA). This EUA will remain in effect (meaning this test can be used) for the duration of the COVID-19 declaration under Section 564(b)(1) of the Act, 21 U.S.C. section 360bbb-3(b)(1), unless the authorization is terminated or revoked.  Performed at [facility], [address] [place], [place] [address]   Blood culture (routine x 2)     Status: None (Preliminary result)   Collection Time: 11/01/20  7:26 PM   Specimen: BLOOD  Result Value Ref Range Status   Specimen Description BLOOD RIGHT ANTECUBITAL  Final   Special Requests   Final    BOTTLES DRAWN AEROBIC AND ANAEROBIC Blood Culture adequate volume   Culture   Final    NO GROWTH 1 DAY Performed at [facility], [address] [place], [place] [address]    Report Status PENDING  Incomplete  Blood culture (routine x 2)     Status: None (Preliminary result)   Collection Time: 11/01/20  7:26 PM   Specimen: BLOOD LEFT HAND  Result Value Ref Range Status   Specimen Description BLOOD LEFT HAND  Final   Special Requests   Final    BOTTLES DRAWN AEROBIC AND ANAEROBIC Blood Culture adequate volume   Culture   Final    NO GROWTH 1 DAY Performed at [facility], [address] [place], [place] [address]    Report Status PENDING  Incomplete  Expectorated Sputum Assessment w Gram Stain, Rflx to Resp Cult     Status: None   Collection Time: 11/01/20 11:57 PM   Specimen: Expectorated Sputum  Result Value Ref Range Status   Specimen Description EXPECTORATED SPUTUM  Final   Special Requests NONE  Final   Sputum evaluation   Final    THIS SPECIMEN IS ACCEPTABLE FOR SPUTUM CULTURE Performed at [facility], [address] [place], [place] [address]    Report Status 11/02/2020 FINAL  Final  Culture, Respiratory w Gram Stain     Status: None (Preliminary result)   Collection Time: 11/01/20 11:57 PM  Result Value  Ref Range Status   Specimen Description EXPECTORATED SPUTUM  Final   Special Requests NONE Reflexed from [id number]  Final   Gram Stain   Final    RARE WBC PRESENT,BOTH PMN AND MONONUCLEAR MODERATE GRAM NEGATIVE RODS MODERATE GRAM POSITIVE COCCI IN PAIRS    Culture   Final    CULTURE REINCUBATED FOR BETTER GROWTH Performed at [facility], [address] [place], [place] [address]    Report Status PENDING  Incomplete  MRSA Next Gen by PCR, Nasal  Status: None   Collection Time: 11/01/20 11:59 PM   Specimen: Nasal Mucosa; Nasal Swab  Result Value Ref Range Status   MRSA by PCR Next Gen NOT DETECTED NOT DETECTED Final    Comment: (NOTE) The GeneXpert MRSA Assay (FDA approved for NASAL specimens only), is one component of a comprehensive MRSA colonization surveillance program. It is not intended to diagnose MRSA infection nor to guide or monitor treatment for MRSA infections. Test performance is not FDA approved in patients less than 33 years old. Performed at [facility], [address], [place], [place] [address]      Serology:   Imaging: If present, new imagings (plain films, ct scans, and mri) have been personally visualized and interpreted; radiology reports have been reviewed. Decision making incorporated into the Impression / Recommendations.  8/19 abd/pelv/chest ct pe protocol 1. Focal pulmonary embolus in the left lower lung pulmonary artery. 2. Chronic lung disease with emphysematous change, bronchiectasis, peribronchial thickening, and intraluminal bronchial secretions/mucus plugging. Patchy peribronchial infiltrates waxing and waning since prior study. Changes could indicate atypical pneumonia or TB. 3. Hilar and mediastinal lymphadenopathy, progressing since the previous study and probably reactive although nonspecific. 4. No acute process demonstrated in the abdomen or pelvis. Left lower quadrant colostomy with peristomal hernia containing  fat. 5. Small esophageal hiatal hernia.  Mild aortic atherosclerosis.  Raymondo Band, MD [facility] [phone] pager    11/03/2020, 2:20 PM

## 2020-11-03 NOTE — Progress Notes (Signed)
VASCULAR LAB    Bilateral lower extremity venous duplex has been performed.  See CV proc for preliminary results.  Messaged results to Dr. Jerral Ralph via secure chat  Sherren Kerns, RVT 11/03/2020, 12:53 PM

## 2020-11-03 NOTE — Progress Notes (Signed)
ANTICOAGULATION CONSULT NOTE  Pharmacy Consult for heparin Indication: pulmonary embolus   Labs: Recent Labs    11/01/20 1656 11/01/20 1719 11/01/20 1926 11/02/20 0221 11/02/20 0500 11/02/20 1259 11/02/20 2040 11/03/20 0139  HGB 14.7 16.0  --  12.8*  --   --   --  11.7*  HCT 44.9 47.0  --  39.0  --   --   --  35.1*  PLT 124*  --   --  162  --   --   --  158  HEPARINUNFRC  --   --   --   --    < > 0.26* 0.29* 0.38  CREATININE 1.65*  --   --  1.52*  --   --   --   --   TROPONINIHS  --   --  24* 12  --   --   --   --    < > = values in this interval not displayed.   Assessment: 65 YOM found to have an acute PE in the left lower pulmonary artery. Pharmacy consulted to dose IV heparin.   Most recent heparin level is therapeutic 0.38 units/ml.  Hg 11.7, PTLC 158  Goal of Therapy:  Heparin level 0.3-0.7 units/ml Monitor platelets by anticoagulation protocol: Yes   Plan:  Continue heparin drip at 2000 units/hr Monitor daily heparin level, CBC and s/s of bleeding   Thanks for allowing pharmacy to be a part of this patient's care.  Talbert Cage, PharmD Clinical Pharmacist

## 2020-11-04 ENCOUNTER — Other Ambulatory Visit (HOSPITAL_COMMUNITY): Payer: Self-pay

## 2020-11-04 DIAGNOSIS — J9601 Acute respiratory failure with hypoxia: Secondary | ICD-10-CM | POA: Diagnosis not present

## 2020-11-04 LAB — BASIC METABOLIC PANEL
Anion gap: 7 (ref 5–15)
BUN: 13 mg/dL (ref 6–20)
CO2: 26 mmol/L (ref 22–32)
Calcium: 9 mg/dL (ref 8.9–10.3)
Chloride: 98 mmol/L (ref 98–111)
Creatinine, Ser: 1.12 mg/dL (ref 0.61–1.24)
GFR, Estimated: >60 mL/min (ref >60)
Glucose, Bld: 90 mg/dL (ref 70–99)
Potassium: 3.9 mmol/L (ref 3.5–5.1)
Sodium: 131 mmol/L — ABNORMAL LOW (ref 135–145)

## 2020-11-04 LAB — CULTURE, RESPIRATORY W GRAM STAIN: Culture: NORMAL

## 2020-11-04 LAB — GLUCOSE, CAPILLARY
Glucose-Capillary: 110 mg/dL — ABNORMAL HIGH (ref 70–99)
Glucose-Capillary: 148 mg/dL — ABNORMAL HIGH (ref 70–99)
Glucose-Capillary: 167 mg/dL — ABNORMAL HIGH (ref 70–99)
Glucose-Capillary: 75 mg/dL (ref 70–99)
Glucose-Capillary: 91 mg/dL (ref 70–99)
Glucose-Capillary: 93 mg/dL (ref 70–99)
Glucose-Capillary: 95 mg/dL (ref 70–99)
Glucose-Capillary: 99 mg/dL (ref 70–99)

## 2020-11-04 LAB — PHOSPHORUS: Phosphorus: 3.3 mg/dL (ref 2.5–4.6)

## 2020-11-04 LAB — KAPPA/LAMBDA LIGHT CHAINS
Kappa free light chain: 316.9 mg/L — ABNORMAL HIGH (ref 3.3–19.4)
Kappa, lambda light chain ratio: 1.58 (ref 0.26–1.65)
Lambda free light chains: 200.6 mg/L — ABNORMAL HIGH (ref 5.7–26.3)

## 2020-11-04 LAB — CBC
HCT: 38.4 % — ABNORMAL LOW (ref 39.0–52.0)
Hemoglobin: 12.7 g/dL — ABNORMAL LOW (ref 13.0–17.0)
MCH: 31.4 pg (ref 26.0–34.0)
MCHC: 33.1 g/dL (ref 30.0–36.0)
MCV: 95 fL (ref 80.0–100.0)
Platelets: 136 10*3/uL — ABNORMAL LOW (ref 150–400)
RBC: 4.04 MIL/uL — ABNORMAL LOW (ref 4.22–5.81)
RDW: 14.7 % (ref 11.5–15.5)
WBC: 4.6 10*3/uL (ref 4.0–10.5)
nRBC: 0 % (ref 0.0–0.2)

## 2020-11-04 LAB — LEGIONELLA PNEUMOPHILA SEROGP 1 UR AG: L. pneumophila Serogp 1 Ur Ag: NEGATIVE

## 2020-11-04 LAB — MAGNESIUM: Magnesium: 1.7 mg/dL (ref 1.7–2.4)

## 2020-11-04 MED ORDER — MAGNESIUM OXIDE -MG SUPPLEMENT 400 (240 MG) MG PO TABS
800.0000 mg | ORAL_TABLET | Freq: Two times a day (BID) | ORAL | Status: DC
  Administered 2020-11-04 – 2020-11-05 (×3): 800 mg via ORAL
  Filled 2020-11-04 (×3): qty 2

## 2020-11-04 NOTE — Progress Notes (Signed)
PROGRESS NOTE  SHANDELL JALLOW WUJ:[id number] DOB: Sep 10, 1970 DOA: 11/01/2020 PCP: Billey Co, MD   LOS: 3 days   Brief narrative: patient is a 50 years old male with past medical history of tuberculosis treated 20 years ago in [place], PJP pneumonia, HIV disease with CD4 count of 99 in September 2021, diabetes mellitus type 2 ran out of his medications for a month after which he started having cough congestion body aches and pleuritic chest pain with coughing.  In the ED patient was noted to be negative for COVID and white blood cell counts were within normal limits.  Temperature max was 101 F and was mildly tachycardic at 125.  Lactate was 1.4.  Pro-Cal was 0.4.  CTA of the chest was suggestive of atypical pneumonia/atypical TB with lymphadenopathy and there was note of segmental PE.  Patient was then admitted hospital for evaluation and treatment.  Infectious disease was consulted as well. Lower extremity duplex is consistent with DVT.   Assessment/Plan:  Principal Problem:   Acute respiratory failure with hypoxia ([name]) Active Problems:   HIV (human immunodeficiency virus infection) ([name])   Community acquired pneumonia   AIDS (acquired immune deficiency syndrome) ([name])   Acute pulmonary embolus ([name])   Type 2 diabetes mellitus ([name])   History of TB (tuberculosis)   History of Pneumocystis jirovecii pneumonia   CKD (chronic kidney disease) stage 3, GFR 30-59 ml/min ([name])   Acute pulmonary embolism ([name])   DVT (deep venous thrombosis) ([name])  Acute hypoxemic respiratory failure along with pleuritic chest pain and shortness of breath: Multifactorial. Possibilities of atypical pneumonia, history of PJP.  Possibility of viral pneumonia as well.  CT scan also shows significant scarring and emphysematous changes. Followed by infectious disease. Currently remains on Rocephin and azithromycin.  Cultures and Fungitell pending.  No suspicion of pulmonary tuberculosis, discontinue airborne  isolation.  We will try to wean off oxygen today.  Mobilize.  Discontinue cardiac telemetry.  Left lower pulmonary  embolism and right lower extremity DVT: CTA of the chest showing segmental pulmonary embolism.  2D echocardiogram normal.  Duplexes with lower extremity DVTs.  Will treat with Eliquis.   HIV. HIV RNA and CD4 count has been pending.  HAART has been reinitiated.On Bactrim for prophylaxis.  Diabetes mellitus type 2. Continue to hold her Janumet.  Continue sliding scale insulin Accu-Cheks diabetic diet  CKD stage IIIa. Improved to baseline.  Hypomagnesemia: Replaced and adequate.   DVT prophylaxis:    Eliquis.   Code Status: Full  Family Communication: None  Status is: Inpatient  Remains inpatient appropriate because:IV treatments appropriate due to intensity of illness or inability to take PO and Inpatient level of care appropriate due to severity of illness  Dispo: The patient is from: Home              Anticipated d/c is to: Home              Patient currently is not medically stable to d/c.   Difficult to place patient No   Consultants: Infectious disease  Procedures: None  Anti-infectives:  Rocephin Zithromax ART  Anti-infectives (From admission, onward)    Start     Dose/Rate Route Frequency Ordered Stop   11/03/20 0800  Darunavir-Cobicistat-Emtricitabine-Tenofovir Alafenamide (SYMTUZA) 800-150-200-10 MG TABS 1 tablet        1 tablet Oral Daily with breakfast 11/02/20 1201     11/02/20 1300  cefTRIAXone (ROCEPHIN) 2 g in sodium chloride 0.9 % 100 mL IVPB  2 g 200 mL/hr over 30 Minutes Intravenous Every 24 hours 11/02/20 1148     11/02/20 1300  dolutegravir (TIVICAY) tablet 50 mg        50 mg Oral Daily 11/02/20 1201     11/02/20 1245  azithromycin (ZITHROMAX) tablet 500 mg        500 mg Oral Daily 11/02/20 1148 11/07/20 0959   11/02/20 1000  vancomycin (VANCOCIN) IVPB 1000 mg/200 mL premix  Status:  Discontinued        1,000 mg 200  mL/hr over 60 Minutes Intravenous Every 12 hours 11/01/20 2014 11/02/20 1148   11/02/20 1000  sulfamethoxazole-trimethoprim (BACTRIM DS) 800-160 MG per tablet 1 tablet        1 tablet Oral Daily 11/01/20 2148     11/02/20 0600  ceFEPIme (MAXIPIME) 2 g in sodium chloride 0.9 % 100 mL IVPB  Status:  Discontinued        2 g 200 mL/hr over 30 Minutes Intravenous Every 8 hours 11/01/20 2014 11/02/20 1148   11/01/20 1930  vancomycin (VANCOREADY) IVPB 1750 mg/350 mL        1,750 mg 175 mL/hr over 120 Minutes Intravenous  Once 11/01/20 1851 11/02/20 0212   11/01/20 1900  ceFEPIme (MAXIPIME) 2 g in sodium chloride 0.9 % 100 mL IVPB        2 g 200 mL/hr over 30 Minutes Intravenous  Once 11/01/20 1851 11/01/20 2023   11/01/20 1900  metroNIDAZOLE (FLAGYL) IVPB 500 mg        500 mg 100 mL/hr over 60 Minutes Intravenous  Once 11/01/20 1851 11/01/20 2132      Subjective: Patient seen and examined.  No more chest pain.  He does have some cough with clear sputum.  Afebrile.  Wants to walk around.  Objective: Vitals:   11/04/20 1214 11/04/20 1237  BP: 116/79   Pulse: 90 85  Resp: 19 17  Temp: 98.6 F (37 C)   SpO2: 94% 90%    Intake/Output Summary (Last 24 hours) at 11/04/2020 1344 Last data filed at 11/04/2020 1011 Gross per 24 hour  Intake 440 ml  Output 1650 ml  Net -1210 ml   Filed Weights   11/01/20 1654  Weight: 81.6 kg   Body mass index is 23.75 kg/m.   Physical Exam: GENERAL: Patient is alert awake and oriented. Not in obvious distress.   HENT: No scleral pallor or icterus. Pupils equally reactive to light. Oral mucosa is moist, on nasal cannula oxygen 2 L/min planning to wean off. NECK: is supple, no gross swelling noted. CHEST: Decreased breath sounds bilaterally.  No other added sounds. CVS: S1 and S2 heard, no murmur. Regular rate and rhythm.  ABDOMEN: Soft, non-tender, bowel sounds are present.  Left lower quadrant colostomy. EXTREMITIES: No edema. CNS: Cranial nerves  are intact. No focal motor deficits. SKIN: warm and dry without rashes.  Data Review: I have personally reviewed the following laboratory data and studies,  CBC: Recent Labs  Lab 11/01/20 1656 11/01/20 1719 11/02/20 0221 11/03/20 0139 11/04/20 0145  WBC 8.4  --  7.9 6.4 4.6  NEUTROABS 5.8  --   --   --   --   HGB 14.7 16.0 12.8* 11.7* 12.7*  HCT 44.9 47.0 39.0 35.1* 38.4*  MCV 97.0  --  96.5 96.2 95.0  PLT 124*  --  162 158 136*   Basic Metabolic Panel: Recent Labs  Lab 11/01/20 1656 11/01/20 1719 11/02/20 0221 11/03/20 0139 11/04/20 0145  NA 134* 138 131* 134* 131*  K 4.4 4.1 3.5 3.8 3.9  CL 99  --  102 103 98  CO2 23  --  GLUCOSE 171*  --  98 84 90  BUN 27*  --  24* 20 13  CREATININE 1.65*  --  1.52* 1.27* 1.12  CALCIUM 9.2  --  8.4* 8.5* 9.0  MG  --   --   --  1.5* 1.7  PHOS  --   --   --   --  3.3   Liver Function Tests: Recent Labs  Lab 11/01/20 1656  AST 36  ALT 29  ALKPHOS 55  BILITOT 0.9  PROT 10.7*  ALBUMIN 2.5*   Recent Labs  Lab 11/01/20 1656  LIPASE 45   No results for input(s): AMMONIA in the last 168 hours. Cardiac Enzymes: No results for input(s): CKTOTAL, CKMB, CKMBINDEX, TROPONINI in the last 168 hours. BNP (last 3 results) No results for input(s): BNP in the last 8760 hours.  ProBNP (last 3 results) No results for input(s): PROBNP in the last 8760 hours.  CBG: Recent Labs  Lab 11/04/20 0046 11/04/20 0351 11/04/20 0420 11/04/20 0828 11/04/20 1217  GLUCAP 93 75 91 95 167*   Recent Results (from the past 240 hour(s))  Resp Panel by RT-PCR (Flu A&B, Covid) Nasopharyngeal Swab     Status: None   Collection Time: 11/01/20  5:02 PM   Specimen: Nasopharyngeal Swab; Nasopharyngeal(NP) swabs in vial transport medium  Result Value Ref Range Status   SARS Coronavirus 2 by RT PCR NEGATIVE NEGATIVE Final    Comment: (NOTE) SARS-CoV-2 target nucleic acids are NOT DETECTED.  The SARS-CoV-2 RNA is generally detectable in  upper respiratory specimens during the acute phase of infection. The lowest concentration of SARS-CoV-2 viral copies this assay can detect is 138 copies/mL. A negative result does not preclude SARS-Cov-2 infection and should not be used as the sole basis for treatment or other patient management decisions. A negative result may occur with  improper specimen collection/handling, submission of specimen other than nasopharyngeal swab, presence of viral mutation(s) within the areas targeted by this assay, and inadequate number of viral copies(<138 copies/mL). A negative result must be combined with clinical observations, patient history, and epidemiological information. The expected result is Negative.  Fact Sheet for Patients:  BloggerCourse.com  Fact Sheet for Healthcare Providers:  SeriousBroker.it  This test is no t yet approved or cleared by the [place] FDA and  has been authorized for detection and/or diagnosis of SARS-CoV-2 by FDA under an Emergency Use Authorization (EUA). This EUA will remain  in effect (meaning this test can be used) for the duration of the COVID-19 declaration under Section 564(b)(1) of the Act, 21 U.S.C.section 360bbb-3(b)(1), unless the authorization is terminated  or revoked sooner.       Influenza A by PCR NEGATIVE NEGATIVE Final   Influenza B by PCR NEGATIVE NEGATIVE Final    Comment: (NOTE) The Xpert Xpress SARS-CoV-2/FLU/RSV plus assay is intended as an aid in the diagnosis of influenza from Nasopharyngeal swab specimens and should not be used as a sole basis for treatment. Nasal washings and aspirates are unacceptable for Xpert Xpress SARS-CoV-2/FLU/RSV testing.  Fact Sheet for Patients: BloggerCourse.com  Fact Sheet for Healthcare Providers: SeriousBroker.it  This test is not yet approved or cleared by the [place] FDA and has been  authorized for detection and/or diagnosis of SARS-CoV-2 by FDA under an Emergency Use Authorization (EUA). This  EUA will remain in effect (meaning this test can be used) for the duration of the COVID-19 declaration under Section 564(b)(1) of the Act, 21 U.S.C. section 360bbb-3(b)(1), unless the authorization is terminated or revoked.  Performed at [facility], [address] [place], [place] [address]   Blood culture (routine x 2)     Status: None (Preliminary result)   Collection Time: 11/01/20  7:26 PM   Specimen: BLOOD  Result Value Ref Range Status   Specimen Description BLOOD RIGHT ANTECUBITAL  Final   Special Requests   Final    BOTTLES DRAWN AEROBIC AND ANAEROBIC Blood Culture adequate volume   Culture   Final    NO GROWTH 2 DAYS Performed at [facility], [address] [place], [place] [address]    Report Status PENDING  Incomplete  Blood culture (routine x 2)     Status: None (Preliminary result)   Collection Time: 11/01/20  7:26 PM   Specimen: BLOOD LEFT HAND  Result Value Ref Range Status   Specimen Description BLOOD LEFT HAND  Final   Special Requests   Final    BOTTLES DRAWN AEROBIC AND ANAEROBIC Blood Culture adequate volume   Culture   Final    NO GROWTH 2 DAYS Performed at [facility], [address] [place], [place] [address]    Report Status PENDING  Incomplete  Expectorated Sputum Assessment w Gram Stain, Rflx to Resp Cult     Status: None   Collection Time: 11/01/20 11:57 PM   Specimen: Expectorated Sputum  Result Value Ref Range Status   Specimen Description EXPECTORATED SPUTUM  Final   Special Requests NONE  Final   Sputum evaluation   Final    THIS SPECIMEN IS ACCEPTABLE FOR SPUTUM CULTURE Performed at [facility], [address] [place], [place] [address]    Report Status 11/02/2020 FINAL  Final  Culture, Respiratory w Gram Stain     Status: None   Collection Time: 11/01/20 11:57 PM  Result Value Ref Range Status    Specimen Description EXPECTORATED SPUTUM  Final   Special Requests NONE Reflexed from [id number]  Final   Gram Stain   Final    RARE WBC PRESENT,BOTH PMN AND MONONUCLEAR MODERATE GRAM NEGATIVE RODS MODERATE GRAM POSITIVE COCCI IN PAIRS    Culture   Final    FEW Normal respiratory flora-no Staph aureus or Pseudomonas seen Performed at [facility], [address] [place], [place] [address]    Report Status 11/04/2020 FINAL  Final  MRSA Next Gen by PCR, Nasal     Status: None   Collection Time: 11/01/20 11:59 PM   Specimen: Nasal Mucosa; Nasal Swab  Result Value Ref Range Status   MRSA by PCR Next Gen NOT DETECTED NOT DETECTED Final    Comment: (NOTE) The GeneXpert MRSA Assay (FDA approved for NASAL specimens only), is one component of a comprehensive MRSA colonization surveillance program. It is not intended to diagnose MRSA infection nor to guide or monitor treatment for MRSA infections. Test performance is not FDA approved in patients less than 49 years old. Performed at [facility], [address] [place], [place] [address]      Studies: VAS US LOWER EXTREMITY VENOUS (DVT)  Result Date: 11/03/2020  Lower Venous DVT Study Patient Name:  Antonieta IbaJEAN-PAUL P Moncada  Date of Exam:   11/03/2020 Medical Rec #: [id number]  Accession #:    [id number] Date of Birth: 1970-08-30            Patient Gender: M Patient Age:   89 years Exam Location:  [facility] Procedure:      VAS [place] LOWER EXTREMITY VENOUS (DVT) Referring Phys: Jilda Panda GARDNER --------------------------------------------------------------------------------  Indications: SOB, and pulmonary embolism.  Risk Factors: History of tuberculosis. HIV/AIDS. Comparison Study: Prior negative bilateral LEV done 02/25/2016 Performing Technologist: Sherren Kerns RVS  Examination Guidelines: A complete evaluation includes B-mode imaging, spectral Doppler, color Doppler, and power Doppler as needed of all accessible portions of  each vessel. Bilateral testing is considered an integral part of a complete examination. Limited examinations for reoccurring indications may be performed as noted. The reflux portion of the exam is performed with the patient in reverse Trendelenburg.  +---------+---------------+---------+-----------+----------+--------------+ RIGHT    CompressibilityPhasicitySpontaneityPropertiesThrombus Aging +---------+---------------+---------+-----------+----------+--------------+ CFV      Full           Yes      Yes                  Rouleaux flow  +---------+---------------+---------+-----------+----------+--------------+ SFJ      Full                                                        +---------+---------------+---------+-----------+----------+--------------+ FV Prox  Full                                                        +---------+---------------+---------+-----------+----------+--------------+ FV Mid   Full                                                        +---------+---------------+---------+-----------+----------+--------------+ FV DistalFull                                                        +---------+---------------+---------+-----------+----------+--------------+ PFV      Full                                                        +---------+---------------+---------+-----------+----------+--------------+ POP      Full           Yes      Yes                                 +---------+---------------+---------+-----------+----------+--------------+ PTV      None  Acute          +---------+---------------+---------+-----------+----------+--------------+ PERO     None                                         Acute          +---------+---------------+---------+-----------+----------+--------------+   +---------+---------------+---------+-----------+----------+--------------+ LEFT      CompressibilityPhasicitySpontaneityPropertiesThrombus Aging +---------+---------------+---------+-----------+----------+--------------+ CFV      Full           Yes      Yes                                 +---------+---------------+---------+-----------+----------+--------------+ SFJ      Full                                                        +---------+---------------+---------+-----------+----------+--------------+ FV Prox  Full                                                        +---------+---------------+---------+-----------+----------+--------------+ FV Mid   Full                                                        +---------+---------------+---------+-----------+----------+--------------+ FV DistalFull                                                        +---------+---------------+---------+-----------+----------+--------------+ PFV      Full                                                        +---------+---------------+---------+-----------+----------+--------------+ POP      Full           Yes      Yes                                 +---------+---------------+---------+-----------+----------+--------------+ PTV      Full                                                        +---------+---------------+---------+-----------+----------+--------------+ PERO     Full                                                        +---------+---------------+---------+-----------+----------+--------------+  Summary: RIGHT: - Findings consistent with acute deep vein thrombosis involving the right posterior tibial veins, and right peroneal veins. - No cystic structure found in the popliteal fossa. - Ultrasound characteristics of enlarged lymph nodes are noted in the groin.  LEFT: - There is no evidence of deep vein thrombosis in the lower extremity.  - No cystic structure found in the popliteal fossa. - Ultrasound characteristics of  enlarged lymph nodes noted in the groin.  *See table(s) above for measurements and observations. Electronically signed by Waverly Ferrari MD on 11/03/2020 at 4:02:04 PM.    Final       Dorcas Carrow, MD  [facility] 11/04/2020  If 7PM-7AM, please contact night-coverage  Total time spent: 30 minutes

## 2020-11-04 NOTE — Progress Notes (Signed)
Morning assessment and medication administration was completed through the use of [name] interpreter #[id number]

## 2020-11-04 NOTE — Progress Notes (Addendum)
[facility]    Date of Admission:  11/01/2020   Total days of antibiotics 4   ID: [name] is a 50 y.o. male with  poorly controlled hiv disease,  Principal Problem:   Acute respiratory failure with hypoxia ([name]) Active Problems:   HIV (human immunodeficiency virus infection) ([name])   Community acquired pneumonia   AIDS (acquired immune deficiency syndrome) ([name])   Acute pulmonary embolus ([name])   Type 2 diabetes mellitus ([name])   History of TB (tuberculosis)   History of Pneumocystis jirovecii pneumonia   CKD (chronic kidney disease) stage 3, GFR 30-59 ml/min ([name])   Acute pulmonary embolism ([name])   DVT (deep venous thrombosis) ([name])    Subjective: Feeling improved. Denies cough  Medications:   azithromycin  500 mg Oral Daily   Darunavir-Cobicistat-Emtricitabine-Tenofovir Alafenamide  1 tablet Oral Q breakfast   dolutegravir  50 mg Oral Daily   enoxaparin (LOVENOX) injection  120 mg Subcutaneous Q24H   insulin aspart  0-9 Units Subcutaneous Q4H   magnesium oxide  800 mg Oral BID   sulfamethoxazole-trimethoprim  1 tablet Oral Daily    Objective: Vital signs in last 24 hours: Temp:  [97.9 F (36.6 C)-98.7 F (37.1 C)] 98.6 F (37 C) (08/22 1214) Pulse Rate:  [86-94] 90 (08/22 1214) Resp:  [16-33] 19 (08/22 1214) BP: (116-126)/(76-87) 116/79 (08/22 1214) SpO2:  [91 %-96 %] 94 % (08/22 1214) Physical Exam  Constitutional: He is oriented to person, place, and time. He appears chronically illand well-nourished. No distress.  HENT: bitemporal wasting Mouth/Throat: Oropharynx is clear and moist. No oropharyngeal exudate.  Cardiovascular: Normal rate, regular rhythm and normal heart sounds. Exam reveals no gallop and no friction rub.  No murmur heard.  Pulmonary/Chest: Effort normal and breath sounds normal. No respiratory distress. He has no wheezes.  Abdominal: Soft. Bowel sounds are normal. He exhibits no distension. There is no  tenderness. Ostomy in place Lymphadenopathy:  He has no cervical adenopathy.  Neurological: He is alert and oriented to person, place, and time.  Skin: Skin is warm and dry. No rash noted. No erythema.  Psychiatric: He has a normal mood and affect. His behavior is normal.    Lab Results Recent Labs    11/03/20 0139 11/04/20 0145  WBC 6.4 4.6  HGB 11.7* 12.7*  HCT 35.1* 38.4*  NA 134* 131*  K 3.8 3.9  CL 103 98  CO2 24 26  BUN 20 13  CREATININE 1.27* 1.12   Liver Panel Recent Labs    11/01/20 1656  PROT 10.7*  ALBUMIN 2.5*  AST 36  ALT 29  ALKPHOS 55  BILITOT 0.9   Sedimentation Rate No results for input(s): ESRSEDRATE in the last 72 hours. C-Reactive Protein No results for input(s): CRP in the last 72 hours.  Microbiology: reviewed Studies/Results: ECHOCARDIOGRAM COMPLETE  Result Date: 11/02/2020    ECHOCARDIOGRAM REPORT   Patient Name:   [name] Date of Exam: 11/02/2020 Medical Rec #:  [id number]          Height:       73.0 in Accession #:    [id number]         Weight:       180.0 lb Date of Birth:  11/25/1970           BSA:          2.057 m Patient Age:    72 years  BP:           107/67 mmHg Patient Gender: M                  HR:           96 bpm. Exam Location:  Inpatient Procedure: 2D Echo, Cardiac Doppler and Color Doppler Indications:    I26.02 Pulmonary embolus  History:        Patient has prior history of Echocardiogram examinations, most                 recent 05/23/2019. COPD. Persistent coughing. H/O TB. HIV.  Sonographer:    Roosvelt Maser RDCS Referring Phys: [phone] JARED M GARDNER IMPRESSIONS  1. Left ventricular ejection fraction, by estimation, is 45 to 50%. The left ventricle has mildly decreased function. The left ventricle demonstrates global hypokinesis. Left ventricular diastolic parameters are indeterminate.  2. Right ventricular systolic function is moderately reduced. RV free wall hypokinesis. The right ventricular size is normal. There is  mildly elevated pulmonary artery systolic pressure. The estimated right ventricular systolic pressure is 35.3 mmHg.  3. The mitral valve is normal in structure. Trivial mitral valve regurgitation.  4. The aortic valve was not well visualized. Aortic valve regurgitation is not visualized. No aortic stenosis is present.  5. The inferior vena cava is normal in size with greater than 50% respiratory variability, suggesting right atrial pressure of 3 mmHg. FINDINGS  Left Ventricle: Left ventricular ejection fraction, by estimation, is 45 to 50%. The left ventricle has mildly decreased function. The left ventricle demonstrates global hypokinesis. The left ventricular internal cavity size was normal in size. There is  no left ventricular hypertrophy. Left ventricular diastolic parameters are indeterminate. Right Ventricle: The right ventricular size is normal. Right vetricular wall thickness was not well visualized. Right ventricular systolic function is moderately reduced. There is mildly elevated pulmonary artery systolic pressure. The tricuspid regurgitant velocity is 2.84 m/s, and with an assumed right atrial pressure of 3 mmHg, the estimated right ventricular systolic pressure is 35.3 mmHg. Left Atrium: Left atrial size was normal in size. Right Atrium: Right atrial size was normal in size. Pericardium: There is no evidence of pericardial effusion. Mitral Valve: The mitral valve is normal in structure. Trivial mitral valve regurgitation. Tricuspid Valve: The tricuspid valve is normal in structure. Tricuspid valve regurgitation is trivial. Aortic Valve: The aortic valve was not well visualized. Aortic valve regurgitation is not visualized. No aortic stenosis is present. Aortic valve mean gradient measures 6.0 mmHg. Aortic valve peak gradient measures 9.6 mmHg. Aortic valve area, by VTI measures 2.05 cm. Pulmonic Valve: The pulmonic valve was not well visualized. Pulmonic valve regurgitation is not visualized. Aorta:  The aortic root is normal in size and structure. Venous: The inferior vena cava is normal in size with greater than 50% respiratory variability, suggesting right atrial pressure of 3 mmHg. IAS/Shunts: The interatrial septum was not well visualized.  LEFT VENTRICLE PLAX 2D LVIDd:         5.00 cm LVIDs:         3.60 cm LV PW:         1.00 cm LV IVS:        0.80 cm LVOT diam:     2.20 cm LV SV:         56 LV SV Index:   27 LVOT Area:     3.80 cm  LV Volumes (MOD) LV vol d, MOD A2C: 86.0 ml LV vol d,  MOD A4C: 86.9 ml LV vol s, MOD A2C: 41.6 ml LV vol s, MOD A4C: 43.0 ml LV SV MOD A2C:     44.4 ml LV SV MOD A4C:     86.9 ml LV SV MOD BP:      42.8 ml RIGHT VENTRICLE RV Basal diam:  2.90 cm RV S prime:     8.81 cm/s TAPSE (M-mode): 1.9 cm LEFT ATRIUM           Index       RIGHT ATRIUM           Index LA diam:      3.40 cm 1.65 cm/m  RA Area:     15.60 cm LA Vol (A2C): 44.9 ml 21.82 ml/m RA Volume:   42.80 ml  20.80 ml/m  AORTIC VALVE AV Area (Vmax):    2.10 cm AV Area (Vmean):   2.00 cm AV Area (VTI):     2.05 cm AV Vmax:           155.00 cm/s AV Vmean:          114.000 cm/s AV VTI:            0.272 m AV Peak Grad:      9.6 mmHg AV Mean Grad:      6.0 mmHg LVOT Vmax:         85.70 cm/s LVOT Vmean:        60.000 cm/s LVOT VTI:          0.147 m LVOT/AV VTI ratio: 0.54  AORTA Ao Root diam: 3.20 cm MITRAL VALVE               TRICUSPID VALVE MV Area (PHT): 4.36 cm    TR Peak grad:   32.3 mmHg MV Decel Time: 174 msec    TR Vmax:        284.00 cm/s MV E velocity: 60.40 cm/s MV A velocity: 61.30 cm/s  SHUNTS MV E/A ratio:  0.99        Systemic VTI:  0.15 m                            Systemic Diam: 2.20 cm Epifanio Lesches MD Electronically signed by Epifanio Lesches MD Signature Date/Time: 11/02/2020/2:05:48 PM    Final    VAS [place] LOWER EXTREMITY VENOUS (DVT)  Result Date: 11/03/2020  Lower Venous DVT Study Patient Name:  [name]  Date of Exam:   11/03/2020 Medical Rec #: [id number]           Accession  #:    [id number] Date of Birth: February 23, 1971            Patient Gender: M Patient Age:   73 years Exam Location:  [facility] Procedure:      VAS [place] LOWER EXTREMITY VENOUS (DVT) Referring Phys: Lyda Perone --------------------------------------------------------------------------------  Indications: SOB, and pulmonary embolism.  Risk Factors: History of tuberculosis. HIV/AIDS. Comparison Study: Prior negative bilateral LEV done 02/25/2016 Performing Technologist: Sherren Kerns RVS  Examination Guidelines: A complete evaluation includes B-mode imaging, spectral Doppler, color Doppler, and power Doppler as needed of all accessible portions of each vessel. Bilateral testing is considered an integral part of a complete examination. Limited examinations for reoccurring indications may be performed as noted. The reflux portion of the exam is performed with the patient in reverse Trendelenburg.  +---------+---------------+---------+-----------+----------+--------------+ RIGHT    CompressibilityPhasicitySpontaneityPropertiesThrombus Aging +---------+---------------+---------+-----------+----------+--------------+ CFV  Full           Yes      Yes                  Rouleaux flow  +---------+---------------+---------+-----------+----------+--------------+ SFJ      Full                                                        +---------+---------------+---------+-----------+----------+--------------+ FV Prox  Full                                                        +---------+---------------+---------+-----------+----------+--------------+ FV Mid   Full                                                        +---------+---------------+---------+-----------+----------+--------------+ FV DistalFull                                                        +---------+---------------+---------+-----------+----------+--------------+ PFV      Full                                                         +---------+---------------+---------+-----------+----------+--------------+ POP      Full           Yes      Yes                                 +---------+---------------+---------+-----------+----------+--------------+ PTV      None                                         Acute          +---------+---------------+---------+-----------+----------+--------------+ PERO     None                                         Acute          +---------+---------------+---------+-----------+----------+--------------+   +---------+---------------+---------+-----------+----------+--------------+ LEFT     CompressibilityPhasicitySpontaneityPropertiesThrombus Aging +---------+---------------+---------+-----------+----------+--------------+ CFV      Full           Yes      Yes                                 +---------+---------------+---------+-----------+----------+--------------+ SFJ      Full                                                        +---------+---------------+---------+-----------+----------+--------------+  FV Prox  Full                                                        +---------+---------------+---------+-----------+----------+--------------+ FV Mid   Full                                                        +---------+---------------+---------+-----------+----------+--------------+ FV DistalFull                                                        +---------+---------------+---------+-----------+----------+--------------+ PFV      Full                                                        +---------+---------------+---------+-----------+----------+--------------+ POP      Full           Yes      Yes                                 +---------+---------------+---------+-----------+----------+--------------+ PTV      Full                                                         +---------+---------------+---------+-----------+----------+--------------+ PERO     Full                                                        +---------+---------------+---------+-----------+----------+--------------+     Summary: RIGHT: - Findings consistent with acute deep vein thrombosis involving the right posterior tibial veins, and right peroneal veins. - No cystic structure found in the popliteal fossa. - Ultrasound characteristics of enlarged lymph nodes are noted in the groin.  LEFT: - There is no evidence of deep vein thrombosis in the lower extremity.  - No cystic structure found in the popliteal fossa. - Ultrasound characteristics of enlarged lymph nodes noted in the groin.  *See table(s) above for measurements and observations. Electronically signed by Waverly Ferrarihristopher Dickson MD on 11/03/2020 at 4:02:04 PM.    Final      Assessment/Plan: Hiv disease, poorly controlled = will plan to make sure he has tivicay and symtuza daily and meds to take home. Will have closer follow up with patient to ensure he becomes undetectable  Pneumonia = continue on azithromycin and ceftriaxone   Oi proph = continue on bactrim ds daily  Newly dx PE = will continue with enoxaparin for now, will see if  can switch to oral agent but it will be difficult since he has multiple mutations HIV disease and will likely need to keep on Protease Inhibitor based regimen  [facility] Cell: [phone] Pager: [phone]  11/04/2020, 12:33 PM

## 2020-11-04 NOTE — TOC Benefit Eligibility Note (Signed)
Transition of Care [facility]) Benefit Eligibility Note    Patient Details  Name: [name] MRN: [id number] Date of Birth: 05-Jun-1970   Medication/Dose: Eliquis 2.5 mg and or 5mg . bid  Covered?: Yes  Tier: 3 Drug  Prescription Coverage [facility]erred [facility]: [facility]  Spoke with Person/Company/Phone Number:: [employer] [employer] PH# [phone]  Co-Pay: $60.00  Prior Approval: No  Deductible:  (?)       [phone] Phone Number: 11/04/2020, 4:10 PM

## 2020-11-05 ENCOUNTER — Telehealth: Payer: Self-pay | Admitting: Pharmacist

## 2020-11-05 ENCOUNTER — Other Ambulatory Visit (HOSPITAL_COMMUNITY): Payer: Self-pay

## 2020-11-05 DIAGNOSIS — J9601 Acute respiratory failure with hypoxia: Secondary | ICD-10-CM | POA: Diagnosis not present

## 2020-11-05 LAB — CBC
HCT: 38 % — ABNORMAL LOW (ref 39.0–52.0)
Hemoglobin: 12.6 g/dL — ABNORMAL LOW (ref 13.0–17.0)
MCH: 31.7 pg (ref 26.0–34.0)
MCHC: 33.2 g/dL (ref 30.0–36.0)
MCV: 95.5 fL (ref 80.0–100.0)
Platelets: 200 10*3/uL (ref 150–400)
RBC: 3.98 MIL/uL — ABNORMAL LOW (ref 4.22–5.81)
RDW: 14.6 % (ref 11.5–15.5)
WBC: 4.5 10*3/uL (ref 4.0–10.5)
nRBC: 0 % (ref 0.0–0.2)

## 2020-11-05 LAB — T-HELPER CELLS (CD4) COUNT (NOT AT ARMC)
CD4 % Helper T Cell: 3 % — ABNORMAL LOW (ref 33–65)
CD4 T Cell Abs: <35 /uL — ABNORMAL LOW (ref 400–1790)

## 2020-11-05 LAB — PROTEIN ELECTROPHORESIS, SERUM
A/G Ratio: 0.4 — ABNORMAL LOW (ref 0.7–1.7)
Albumin ELP: 2.3 g/dL — ABNORMAL LOW (ref 2.9–4.4)
Alpha-1-Globulin: 0.3 g/dL (ref 0.0–0.4)
Alpha-2-Globulin: 1.2 g/dL — ABNORMAL HIGH (ref 0.4–1.0)
Beta Globulin: 1.2 g/dL (ref 0.7–1.3)
Gamma Globulin: 3.3 g/dL — ABNORMAL HIGH (ref 0.4–1.8)
Globulin, Total: 6 g/dL — ABNORMAL HIGH (ref 2.2–3.9)
M-Spike, %: 0.9 g/dL — ABNORMAL HIGH
Total Protein ELP: 8.3 g/dL (ref 6.0–8.5)

## 2020-11-05 LAB — GLUCOSE, CAPILLARY
Glucose-Capillary: 136 mg/dL — ABNORMAL HIGH (ref 70–99)
Glucose-Capillary: 173 mg/dL — ABNORMAL HIGH (ref 70–99)
Glucose-Capillary: 92 mg/dL (ref 70–99)
Glucose-Capillary: 102 mg/dL — ABNORMAL HIGH (ref 70–99)

## 2020-11-05 LAB — HIV-1 RNA QUANT-NO REFLEX-BLD
HIV 1 RNA Quant: 10800 copies/mL
LOG10 HIV-1 RNA: 4.033 log10copy/mL

## 2020-11-05 MED ORDER — SODIUM CHLORIDE 0.9 % IV SOLN
2.0000 g | Freq: Once | INTRAVENOUS | Status: AC
  Administered 2020-11-05: 2 g via INTRAVENOUS
  Filled 2020-11-05: qty 20

## 2020-11-05 MED ORDER — ENOXAPARIN (LOVENOX) PATIENT EDUCATION KIT
PACK | Freq: Once | Status: AC
  Filled 2020-11-05: qty 1

## 2020-11-05 MED ORDER — SYMTUZA 800-150-200-10 MG PO TABS
1.0000 | ORAL_TABLET | Freq: Every day | ORAL | 0 refills | Status: DC
  Filled 2020-11-05: qty 30, 30d supply, fill #0

## 2020-11-05 MED ORDER — ENOXAPARIN SODIUM 120 MG/0.8ML IJ SOSY
120.0000 mg | PREFILLED_SYRINGE | INTRAMUSCULAR | 0 refills | Status: DC
  Filled 2020-11-05: qty 24, 30d supply, fill #0

## 2020-11-05 MED ORDER — TIVICAY 50 MG PO TABS
50.0000 mg | ORAL_TABLET | Freq: Every day | ORAL | 0 refills | Status: DC
  Filled 2020-11-05: qty 30, 30d supply, fill #0

## 2020-11-05 MED ORDER — SULFAMETHOXAZOLE-TRIMETHOPRIM 800-160 MG PO TABS
1.0000 | ORAL_TABLET | Freq: Every day | ORAL | 0 refills | Status: DC
  Filled 2020-11-05: qty 30, 30d supply, fill #0

## 2020-11-05 NOTE — Progress Notes (Signed)
D/C instruction given to pt via translator. Medications reviewed. IV removed x2.  Versie Starks, RN

## 2020-11-05 NOTE — Progress Notes (Signed)
SATURATION QUALIFICATIONS: (This note is used to comply with regulatory documentation for home oxygen)  Patient Saturations on Room Air at Rest = 86%  Patient Saturations on Room Air while Ambulating = 91%   Please briefly explain why patient needs home oxygen:

## 2020-11-05 NOTE — Progress Notes (Signed)
Lovenox teaching completed with translator [phone] and patient given teaching kit. Patient administered his evening injection. Patient expressed that he was comfortable giving himself the injections at home.

## 2020-11-05 NOTE — TOC Benefit Eligibility Note (Signed)
Patient Advocate Encounter   Was successful in obtaining a [employer] copay card for Tivicay.  This copay card will make the patients copay $0.00.    RxBin: [id number] PCN: [id number] Member ID: [id number] Group ID: [id number]     Roland Earl, CPhT Pharmacy Patient Advocate Specialist [facility] Team Direct Number: [phone]  Fax: [phone]

## 2020-11-05 NOTE — Discharge Summary (Signed)
Physician Discharge Summary  DURREL MCNEE VOH:[id number] DOB: [birth year] DOA: 11/01/2020  PCP: Billey Co, MD  Admit date: 11/01/2020 Discharge date: 11/05/2020  Admitted From: Home Disposition: Home   Recommendations for Outpatient Follow-up:  Follow up with PCP in 1-2 weeks Please obtain BMP/CBC in one week Follow-up with infectious disease clinic without interruption.  Take your medications without interruption.  Home Health: Not applicable Equipment/Devices: Not applicable  Discharge Condition: Stable CODE STATUS: Full code Diet recommendation: Regular diet  Discharge summary: 50 years old male with past medical history of tuberculosis treated 20 years ago in [place], PJP pneumonia, HIV disease with CD4 count of 99 in September 2021, diabetes mellitus type 2 ran out of his medications for a month after which he started having cough congestion body aches and pleuritic chest pain with coughing.  In the ED patient was noted to be negative for COVID and white blood cell counts were within normal limits.  Temperature max was 101 F and was mildly tachycardic at 125.  Lactate was 1.4.  Pro-Cal was 0.4.  CTA of the chest was suggestive of atypical pneumonia/atypical TB with lymphadenopathy and there was note of segmental PE.  Patient was then admitted hospital for evaluation and treatment.  Infectious disease was consulted as well. Lower extremity duplex is consistent with DVT.  Acute hypoxemic respiratory failure along with pleuritic chest pain and shortness of breath: Probably multifactorial.  Suspected atypical pneumonia with history of PJP.  Also with pulmonary embolism left lower lobe.  Treated with broad-spectrum antibiotics, antiretrovirals were resumed along with Bactrim prophylaxis with good clinical recovery.  Currently on room air.  Episodic drop in oxygen saturations but asymptomatic. Completed 5 days of Rocephin and azithromycin.  Started back on ART along with  Bactrim.  Acute pulmonary embolism without cor pulmonale, right lower extremity DVT: Unprovoked.  No obvious exacerbating factors.  Pleuritic chest pain improved.  2D echocardiogram was normal. Patient was treated with heparin and subsequent Lovenox.  Unfortunately, he has no other antiretroviral choices that have no interaction with oral anticoagulants.  He has to be on Lovenox.  Lovenox teaching was done.  For convenience, Lovenox was prescribed once daily as 1.5 mg/kg/day.  He is learning to do it.  Type 2 diabetes on Janumet that he can resume.  Electrolytes were replaced and adequate.  Discharge Diagnoses:  Principal Problem:   Acute respiratory failure with hypoxia ([name]) Active Problems:   HIV (human immunodeficiency virus infection) ([name])   Community acquired pneumonia   AIDS (acquired immune deficiency syndrome) ([name])   Acute pulmonary embolus ([name])   Type 2 diabetes mellitus ([name])   History of TB (tuberculosis)   History of Pneumocystis jirovecii pneumonia   CKD (chronic kidney disease) stage 3, GFR 30-59 ml/min ([name])   Acute pulmonary embolism ([name])   DVT (deep venous thrombosis) ([name])    Discharge Instructions  Discharge Instructions     Call MD for:  difficulty breathing, headache or visual disturbances   Complete by: As directed    Diet general   Complete by: As directed    Increase activity slowly   Complete by: As directed       Allergies as of 11/05/2020   No Known Allergies      Medication List     STOP taking these medications    ferrous sulfate 325 (65 FE) MG tablet   guaiFENesin 600 MG 12 hr tablet Commonly known as: MUCINEX       TAKE these medications  enoxaparin 120 MG/0.8ML injection Commonly known as: LOVENOX Inject 0.8 mLs (120 mg total) into the skin daily.   sitaGLIPtin-metformin 50-500 MG tablet Commonly known as: JANUMET Take 1 tablet by mouth 2 (two) times daily with a meal.   sulfamethoxazole-trimethoprim 800-160 MG  tablet Commonly known as: BACTRIM DS Take 1 tablet by mouth daily.   Symtuza 800-150-200-10 MG Tabs Generic drug: Darunavir-Cobicistat-Emtricitabine-Tenofovir Alafenamide Take 1 tablet by mouth daily with breakfast.   Tivicay 50 MG tablet Generic drug: dolutegravir Take 1 tablet (50 mg total) by mouth daily.        No Known Allergies  Consultations: Infectious disease   Procedures/Studies: DG Chest 2 View  Result Date: 11/01/2020 CLINICAL DATA:  Cough and fever.  Shortness of breath. EXAM: CHEST - 2 VIEW COMPARISON:  05/17/2019 FINDINGS: The heart size and mediastinal contours are within normal limits. Extensive scarring in left mid and lower lung again seen. Emphysematous changes again noted with biapical pleural-parenchymal scarring. No new or worsening areas of pulmonary opacity are seen. No evidence of pleural effusion. IMPRESSION: Stable extensive left lung scarring and emphysema. No acute findings. Electronically Signed   By: Danae Orleans M.D.   On: 11/01/2020 17:57   CT Angio Chest PE W and/or Wo Contrast  Result Date: 11/01/2020 CLINICAL DATA:  Pulmonary embolus suspected. High probability. Chest pain, shortness of breath, tachycardia. Abdominal pain, acute nonlocalized. Generalized. Fever. Concern for infection. EXAM: CT ANGIOGRAPHY CHEST CT ABDOMEN AND PELVIS WITH CONTRAST TECHNIQUE: Multidetector CT imaging of the chest was performed using the standard protocol during bolus administration of intravenous contrast. Multiplanar CT image reconstructions and MIPs were obtained to evaluate the vascular anatomy. Multidetector CT imaging of the abdomen and pelvis was performed using the standard protocol during bolus administration of intravenous contrast. CONTRAST:  OMNIPAQUE IOHEXOL 350 MG/ML SOLN COMPARISON:  CT a chest 05/17/2019 FINDINGS: CTA CHEST FINDINGS Cardiovascular: There is moderately good opacification of the central and segmental pulmonary arteries. There is  overall decreased opacification of the left lower lung pulmonary arteries in comparison to the right and there is a small linear filling defect in the left lower lobe pulmonary artery. This is probably a small focal pulmonary embolus. No other emboli are identified. Normal caliber thoracic aorta without evidence of dissection. Normal heart size. No pericardial effusions. Mediastinum/Nodes: Small esophageal hiatal hernia. Esophagus is decompressed. Mediastinal and hilar lymphadenopathy with right hilar lymph nodes measuring up to 2.4 cm diameter and left pretracheal nodes measuring up to 1.5 cm diameter. Lymph nodes appear to be increasing in size since the previous study. Mild right axillary lymphadenopathy with nodes measuring 11 mm short axis dimension. Lungs/Pleura: There is evidence of chronic lung disease with emphysematous changes, apical scarring, cystic and cylindrical bronchiectasis with bronchial wall thickening and either intraluminal secretions or mucous plugging. Patchy peribronchial infiltrates could represent bronchopneumonia or chronic inflammatory process. Some of the ground-glass changes seen previously in the left lingula are improved since previous study but there is increasing change in the left lower lung. Musculoskeletal: No chest wall abnormality. No acute or significant osseous findings. Review of the MIP images confirms the above findings. CT ABDOMEN and PELVIS FINDINGS Hepatobiliary: Mild fatty infiltration of the liver. No focal lesions. Gallbladder and bile ducts are unremarkable. Pancreas: Unremarkable. No pancreatic ductal dilatation or surrounding inflammatory changes. Spleen: Normal in size without focal abnormality. Adrenals/Urinary Tract: Adrenal glands are unremarkable. Kidneys are normal, without renal calculi, focal lesion, or hydronephrosis. Bladder is unremarkable. Stomach/Bowel: Left lower quadrant diverting colostomy.  Peristomal hernia containing fat. Stomach, small bowel,  and colon are not abnormally distended. Scattered stool throughout the colon. No inflammatory changes are appreciated. Appendix is not identified. Vascular/Lymphatic: No significant lymphadenopathy. Scattered aortic calcification. No aneurysm. Reproductive: Prostate is unremarkable. Other: No free air or free fluid in the abdomen. Musculoskeletal: No acute or significant osseous findings. Review of the MIP images confirms the above findings. IMPRESSION: 1. Focal pulmonary embolus in the left lower lung pulmonary artery. 2. Chronic lung disease with emphysematous change, bronchiectasis, peribronchial thickening, and intraluminal bronchial secretions/mucus plugging. Patchy peribronchial infiltrates waxing and waning since prior study. Changes could indicate atypical pneumonia or TB. 3. Hilar and mediastinal lymphadenopathy, progressing since the previous study and probably reactive although nonspecific. 4. No acute process demonstrated in the abdomen or pelvis. 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 5. Small esophageal hiatal hernia.  Mild aortic atherosclerosis. Critical Value/emergent results were called by telephone at the time of interpretation on 11/01/2020 at 8:46 pm to provider [facility] , who verbally acknowledged these results. Electronically Signed   By: Burman Nieves M.D.   On: 11/01/2020 20:50   CT ABDOMEN PELVIS W CONTRAST  Result Date: 11/01/2020 CLINICAL DATA:  Pulmonary embolus suspected. High probability. Chest pain, shortness of breath, tachycardia. Abdominal pain, acute nonlocalized. Generalized. Fever. Concern for infection. EXAM: CT ANGIOGRAPHY CHEST CT ABDOMEN AND PELVIS WITH CONTRAST TECHNIQUE: Multidetector CT imaging of the chest was performed using the standard protocol during bolus administration of intravenous contrast. Multiplanar CT image reconstructions and MIPs were obtained to evaluate the vascular anatomy. Multidetector CT imaging of the  abdomen and pelvis was performed using the standard protocol during bolus administration of intravenous contrast. CONTRAST:  OMNIPAQUE IOHEXOL 350 MG/ML SOLN COMPARISON:  CT a chest 05/17/2019 FINDINGS: CTA CHEST FINDINGS Cardiovascular: There is moderately good opacification of the central and segmental pulmonary arteries. There is overall decreased opacification of the left lower lung pulmonary arteries in comparison to the right and there is a small linear filling defect in the left lower lobe pulmonary artery. This is probably a small focal pulmonary embolus. No other emboli are identified. Normal caliber thoracic aorta without evidence of dissection. Normal heart size. No pericardial effusions. Mediastinum/Nodes: Small esophageal hiatal hernia. Esophagus is decompressed. Mediastinal and hilar lymphadenopathy with right hilar lymph nodes measuring up to 2.4 cm diameter and left pretracheal nodes measuring up to 1.5 cm diameter. Lymph nodes appear to be increasing in size since the previous study. Mild right axillary lymphadenopathy with nodes measuring 11 mm short axis dimension. Lungs/Pleura: There is evidence of chronic lung disease with emphysematous changes, apical scarring, cystic and cylindrical bronchiectasis with bronchial wall thickening and either intraluminal secretions or mucous plugging. Patchy peribronchial infiltrates could represent bronchopneumonia or chronic inflammatory process. Some of the ground-glass changes seen previously in the left lingula are improved since previous study but there is increasing change in the left lower lung. Musculoskeletal: No chest wall abnormality. No acute or significant osseous findings. Review of the MIP images confirms the above findings. CT ABDOMEN and PELVIS FINDINGS Hepatobiliary: Mild fatty infiltration of the liver. No focal lesions. Gallbladder and bile ducts are unremarkable. Pancreas: Unremarkable. No pancreatic ductal dilatation or surrounding  inflammatory changes. Spleen: Normal in size without focal abnormality. Adrenals/Urinary Tract: Adrenal glands are unremarkable. Kidneys are normal, without renal calculi, focal lesion, or hydronephrosis. Bladder is unremarkable. Stomach/Bowel: Left lower quadrant diverting colostomy. Peristomal hernia containing fat. Stomach, small bowel, and colon are not abnormally distended. Scattered  stool throughout the colon. No inflammatory changes are appreciated. Appendix is not identified. Vascular/Lymphatic: No significant lymphadenopathy. Scattered aortic calcification. No aneurysm. Reproductive: Prostate is unremarkable. Other: No free air or free fluid in the abdomen. Musculoskeletal: No acute or significant osseous findings. Review of the MIP images confirms the above findings. IMPRESSION: 1. Focal pulmonary embolus in the left lower lung pulmonary artery. 2. Chronic lung disease with emphysematous change, bronchiectasis, peribronchial thickening, and intraluminal bronchial secretions/mucus plugging. Patchy peribronchial infiltrates waxing and waning since prior study. Changes could indicate atypical pneumonia or TB. 3. Hilar and mediastinal lymphadenopathy, progressing since the previous study and probably reactive although nonspecific. 4. No acute process demonstrated in the abdomen or pelvis. 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 5. Small esophageal hiatal hernia.  Mild aortic atherosclerosis. Critical Value/emergent results were called by telephone at the time of interpretation on 11/01/2020 at 8:46 pm to provider [facility] , who verbally acknowledged these results. Electronically Signed   By: Burman Nieves M.D.   On: 11/01/2020 20:50   ECHOCARDIOGRAM COMPLETE  Result Date: 11/02/2020    ECHOCARDIOGRAM REPORT   Patient Name:   [name] Date of Exam: 11/02/2020 Medical Rec #:  [id number]          Height:       73.0 in Accession #:    [id number]         Weight:        180.0 lb Date of Birth:  08-Dec-1970           BSA:          2.057 m Patient Age:    50 years           BP:           107/67 mmHg Patient Gender: M                  HR:           96 bpm. Exam Location:  Inpatient Procedure: 2D Echo, Cardiac Doppler and Color Doppler Indications:    I26.02 Pulmonary embolus  History:        Patient has prior history of Echocardiogram examinations, most                 recent 05/23/2019. COPD. Persistent coughing. H/O TB. HIV.  Sonographer:    Roosvelt Maser RDCS Referring Phys: [phone] JARED M GARDNER IMPRESSIONS  1. Left ventricular ejection fraction, by estimation, is 45 to 50%. The left ventricle has mildly decreased function. The left ventricle demonstrates global hypokinesis. Left ventricular diastolic parameters are indeterminate.  2. Right ventricular systolic function is moderately reduced. RV free wall hypokinesis. The right ventricular size is normal. There is mildly elevated pulmonary artery systolic pressure. The estimated right ventricular systolic pressure is 35.3 mmHg.  3. The mitral valve is normal in structure. Trivial mitral valve regurgitation.  4. The aortic valve was not well visualized. Aortic valve regurgitation is not visualized. No aortic stenosis is present.  5. The inferior vena cava is normal in size with greater than 50% respiratory variability, suggesting right atrial pressure of 3 mmHg. FINDINGS  Left Ventricle: Left ventricular ejection fraction, by estimation, is 45 to 50%. The left ventricle has mildly decreased function. The left ventricle demonstrates global hypokinesis. The left ventricular internal cavity size was normal in size. There is  no left ventricular hypertrophy. Left ventricular diastolic parameters are indeterminate. Right Ventricle: The right ventricular size is normal. Right vetricular  wall thickness was not well visualized. Right ventricular systolic function is moderately reduced. There is mildly elevated pulmonary artery systolic  pressure. The tricuspid regurgitant velocity is 2.84 m/s, and with an assumed right atrial pressure of 3 mmHg, the estimated right ventricular systolic pressure is 35.3 mmHg. Left Atrium: Left atrial size was normal in size. Right Atrium: Right atrial size was normal in size. Pericardium: There is no evidence of pericardial effusion. Mitral Valve: The mitral valve is normal in structure. Trivial mitral valve regurgitation. Tricuspid Valve: The tricuspid valve is normal in structure. Tricuspid valve regurgitation is trivial. Aortic Valve: The aortic valve was not well visualized. Aortic valve regurgitation is not visualized. No aortic stenosis is present. Aortic valve mean gradient measures 6.0 mmHg. Aortic valve peak gradient measures 9.6 mmHg. Aortic valve area, by VTI measures 2.05 cm. Pulmonic Valve: The pulmonic valve was not well visualized. Pulmonic valve regurgitation is not visualized. Aorta: The aortic root is normal in size and structure. Venous: The inferior vena cava is normal in size with greater than 50% respiratory variability, suggesting right atrial pressure of 3 mmHg. IAS/Shunts: The interatrial septum was not well visualized.  LEFT VENTRICLE PLAX 2D LVIDd:         5.00 cm LVIDs:         3.60 cm LV PW:         1.00 cm LV IVS:        0.80 cm LVOT diam:     2.20 cm LV SV:         56 LV SV Index:   27 LVOT Area:     3.80 cm  LV Volumes (MOD) LV vol d, MOD A2C: 86.0 ml LV vol d, MOD A4C: 86.9 ml LV vol s, MOD A2C: 41.6 ml LV vol s, MOD A4C: 43.0 ml LV SV MOD A2C:     44.4 ml LV SV MOD A4C:     86.9 ml LV SV MOD BP:      42.8 ml RIGHT VENTRICLE RV Basal diam:  2.90 cm RV S prime:     8.81 cm/s TAPSE (M-mode): 1.9 cm LEFT ATRIUM           Index       RIGHT ATRIUM           Index LA diam:      3.40 cm 1.65 cm/m  RA Area:     15.60 cm LA Vol (A2C): 44.9 ml 21.82 ml/m RA Volume:   42.80 ml  20.80 ml/m  AORTIC VALVE AV Area (Vmax):    2.10 cm AV Area (Vmean):   2.00 cm AV Area (VTI):     2.05 cm AV  Vmax:           155.00 cm/s AV Vmean:          114.000 cm/s AV VTI:            0.272 m AV Peak Grad:      9.6 mmHg AV Mean Grad:      6.0 mmHg LVOT Vmax:         85.70 cm/s LVOT Vmean:        60.000 cm/s LVOT VTI:          0.147 m LVOT/AV VTI ratio: 0.54  AORTA Ao Root diam: 3.20 cm MITRAL VALVE               TRICUSPID VALVE MV Area (PHT): 4.36 cm    TR Peak grad:  32.3 mmHg MV Decel Time: 174 msec    TR Vmax:        284.00 cm/s MV E velocity: 60.40 cm/s MV A velocity: 61.30 cm/s  SHUNTS MV E/A ratio:  0.99        Systemic VTI:  0.15 m                            Systemic Diam: 2.20 cm Epifanio Lesches MD Electronically signed by Epifanio Lesches MD Signature Date/Time: 11/02/2020/2:05:48 PM    Final    VAS [place] LOWER EXTREMITY VENOUS (DVT)  Result Date: 11/03/2020  Lower Venous DVT Study Patient Name:  RAVINDER LUKEHART  Date of Exam:   11/03/2020 Medical Rec #: [id number]           Accession #:    [id number] Date of Birth: [birth year]            Patient Gender: M Patient Age:   5 years Exam Location:  [facility] Procedure:      VAS [place] LOWER EXTREMITY VENOUS (DVT) Referring Phys: Lyda Perone --------------------------------------------------------------------------------  Indications: SOB, and pulmonary embolism.  Risk Factors: History of tuberculosis. HIV/AIDS. Comparison Study: Prior negative bilateral LEV done 02/25/2016 Performing Technologist: Sherren Kerns RVS  Examination Guidelines: A complete evaluation includes B-mode imaging, spectral Doppler, color Doppler, and power Doppler as needed of all accessible portions of each vessel. Bilateral testing is considered an integral part of a complete examination. Limited examinations for reoccurring indications may be performed as noted. The reflux portion of the exam is performed with the patient in reverse Trendelenburg.  +---------+---------------+---------+-----------+----------+--------------+ RIGHT     CompressibilityPhasicitySpontaneityPropertiesThrombus Aging +---------+---------------+---------+-----------+----------+--------------+ CFV      Full           Yes      Yes                  Rouleaux flow  +---------+---------------+---------+-----------+----------+--------------+ SFJ      Full                                                        +---------+---------------+---------+-----------+----------+--------------+ FV Prox  Full                                                        +---------+---------------+---------+-----------+----------+--------------+ FV Mid   Full                                                        +---------+---------------+---------+-----------+----------+--------------+ FV DistalFull                                                        +---------+---------------+---------+-----------+----------+--------------+ PFV      Full                                                        +---------+---------------+---------+-----------+----------+--------------+  POP      Full           Yes      Yes                                 +---------+---------------+---------+-----------+----------+--------------+ PTV      None                                         Acute          +---------+---------------+---------+-----------+----------+--------------+ PERO     None                                         Acute          +---------+---------------+---------+-----------+----------+--------------+   +---------+---------------+---------+-----------+----------+--------------+ LEFT     CompressibilityPhasicitySpontaneityPropertiesThrombus Aging +---------+---------------+---------+-----------+----------+--------------+ CFV      Full           Yes      Yes                                 +---------+---------------+---------+-----------+----------+--------------+ SFJ      Full                                                         +---------+---------------+---------+-----------+----------+--------------+ FV Prox  Full                                                        +---------+---------------+---------+-----------+----------+--------------+ FV Mid   Full                                                        +---------+---------------+---------+-----------+----------+--------------+ FV DistalFull                                                        +---------+---------------+---------+-----------+----------+--------------+ PFV      Full                                                        +---------+---------------+---------+-----------+----------+--------------+ POP      Full           Yes      Yes                                 +---------+---------------+---------+-----------+----------+--------------+  PTV      Full                                                        +---------+---------------+---------+-----------+----------+--------------+ PERO     Full                                                        +---------+---------------+---------+-----------+----------+--------------+     Summary: RIGHT: - Findings consistent with acute deep vein thrombosis involving the right posterior tibial veins, and right peroneal veins. - No cystic structure found in the popliteal fossa. - Ultrasound characteristics of enlarged lymph nodes are noted in the groin.  LEFT: - There is no evidence of deep vein thrombosis in the lower extremity.  - No cystic structure found in the popliteal fossa. - Ultrasound characteristics of enlarged lymph nodes noted in the groin.  *See table(s) above for measurements and observations. Electronically signed by Waverly Ferrari MD on 11/03/2020 at 4:02:04 PM.    Final    (Echo, Carotid, EGD, Colonoscopy, ERCP)    Subjective: Patient seen and examined.  No overnight events.  Has some dry cough but denies any chest pain or shortness of  breath.  Eager to go home.  Understands instructions.  He knows where to go for his HIV treatment.   Discharge Exam: Vitals:   11/05/20 0820 11/05/20 1135  BP: 110/65 97/63  Pulse: (!) 101 100  Resp: 18 20  Temp: 98.1 F (36.7 C) 98.7 F (37.1 C)  SpO2: 90% (!) 86%   Vitals:   11/04/20 2345 11/05/20 0348 11/05/20 0820 11/05/20 1135  BP: 109/74 117/71 110/65 97/63  Pulse: 90 95 (!) 101 100  Resp: 18 18 18 20   Temp: 98.6 F (37 C) 98.2 F (36.8 C) 98.1 F (36.7 C) 98.7 F (37.1 C)  TempSrc: Oral Oral Oral Oral  SpO2: 95% 91% 90% (!) 86%  Weight:      Height:        General: Pt is alert, awake, not in acute distress Thinly built.  On room air. Cardiovascular: RRR, S1/S2 +, no rubs, no gallops Respiratory: CTA bilaterally, no wheezing, no rhonchi Abdominal: Soft, NT, ND, bowel sounds + Extremities: no edema, no cyanosis    The results of significant diagnostics from this hospitalization (including imaging, microbiology, ancillary and laboratory) are listed below for reference.     Microbiology: Recent Results (from the past 240 hour(s))  Resp Panel by RT-PCR (Flu A&B, Covid) Nasopharyngeal Swab     Status: None   Collection Time: 11/01/20  5:02 PM   Specimen: Nasopharyngeal Swab; Nasopharyngeal(NP) swabs in vial transport medium  Result Value Ref Range Status   SARS Coronavirus 2 by RT PCR NEGATIVE NEGATIVE Final    Comment: (NOTE) SARS-CoV-2 target nucleic acids are NOT DETECTED.  The SARS-CoV-2 RNA is generally detectable in upper respiratory specimens during the acute phase of infection. The lowest concentration of SARS-CoV-2 viral copies this assay can detect is 138 copies/mL. A negative result does not preclude SARS-Cov-2 infection and should not be used as the sole basis for treatment or other patient management decisions. A negative result may occur with  improper specimen collection/handling, submission of specimen other than nasopharyngeal swab,  presence of viral mutation(s) within the areas targeted by this assay, and inadequate number of viral copies(<138 copies/mL). A negative result must be combined with clinical observations, patient history, and epidemiological information. The expected result is Negative.  Fact Sheet for Patients:  BloggerCourse.com  Fact Sheet for Healthcare Providers:  SeriousBroker.it  This test is no t yet approved or cleared by the [place] FDA and  has been authorized for detection and/or diagnosis of SARS-CoV-2 by FDA under an Emergency Use Authorization (EUA). This EUA will remain  in effect (meaning this test can be used) for the duration of the COVID-19 declaration under Section 564(b)(1) of the Act, 21 U.S.C.section 360bbb-3(b)(1), unless the authorization is terminated  or revoked sooner.       Influenza A by PCR NEGATIVE NEGATIVE Final   Influenza B by PCR NEGATIVE NEGATIVE Final    Comment: (NOTE) The Xpert Xpress SARS-CoV-2/FLU/RSV plus assay is intended as an aid in the diagnosis of influenza from Nasopharyngeal swab specimens and should not be used as a sole basis for treatment. Nasal washings and aspirates are unacceptable for Xpert Xpress SARS-CoV-2/FLU/RSV testing.  Fact Sheet for Patients: BloggerCourse.com  Fact Sheet for Healthcare Providers: SeriousBroker.it  This test is not yet approved or cleared by the [place] FDA and has been authorized for detection and/or diagnosis of SARS-CoV-2 by FDA under an Emergency Use Authorization (EUA). This EUA will remain in effect (meaning this test can be used) for the duration of the COVID-19 declaration under Section 564(b)(1) of the Act, 21 U.S.C. section 360bbb-3(b)(1), unless the authorization is terminated or revoked.  Performed at [facility], [address] [place], [place] [address]   Blood culture  (routine x 2)     Status: None (Preliminary result)   Collection Time: 11/01/20  7:26 PM   Specimen: BLOOD  Result Value Ref Range Status   Specimen Description BLOOD RIGHT ANTECUBITAL  Final   Special Requests   Final    BOTTLES DRAWN AEROBIC AND ANAEROBIC Blood Culture adequate volume   Culture   Final    NO GROWTH 3 DAYS Performed at [facility], [address] [place], [place] [address]    Report Status PENDING  Incomplete  Blood culture (routine x 2)     Status: None (Preliminary result)   Collection Time: 11/01/20  7:26 PM   Specimen: BLOOD LEFT HAND  Result Value Ref Range Status   Specimen Description BLOOD LEFT HAND  Final   Special Requests   Final    BOTTLES DRAWN AEROBIC AND ANAEROBIC Blood Culture adequate volume   Culture   Final    NO GROWTH 3 DAYS Performed at [facility], [address] [place], [place] [address]    Report Status PENDING  Incomplete  Expectorated Sputum Assessment w Gram Stain, Rflx to Resp Cult     Status: None   Collection Time: 11/01/20 11:57 PM   Specimen: Expectorated Sputum  Result Value Ref Range Status   Specimen Description EXPECTORATED SPUTUM  Final   Special Requests NONE  Final   Sputum evaluation   Final    THIS SPECIMEN IS ACCEPTABLE FOR SPUTUM CULTURE Performed at [facility], [address], [place], [place] [address]    Report Status 11/02/2020 FINAL  Final  Culture, Respiratory w Gram Stain     Status: None   Collection Time: 11/01/20 11:57 PM  Result Value Ref Range Status   Specimen Description EXPECTORATED SPUTUM  Final   Special Requests NONE Reflexed from [id number]  Final   Gram Stain   Final    RARE WBC PRESENT,BOTH PMN AND MONONUCLEAR MODERATE GRAM NEGATIVE RODS MODERATE GRAM POSITIVE COCCI IN PAIRS    Culture   Final    FEW Normal respiratory flora-no Staph aureus or Pseudomonas seen Performed at [facility], [address], [place], [place] [address]    Report Status 11/04/2020  FINAL  Final  MRSA Next Gen by PCR, Nasal     Status: None   Collection Time: 11/01/20 11:59 PM   Specimen: Nasal Mucosa; Nasal Swab  Result Value Ref Range Status   MRSA by PCR Next Gen NOT DETECTED NOT DETECTED Final    Comment: (NOTE) The GeneXpert MRSA Assay (FDA approved for NASAL specimens only), is one component of a comprehensive MRSA colonization surveillance program. It is not intended to diagnose MRSA infection nor to guide or monitor treatment for MRSA infections. Test performance is not FDA approved in patients less than 64 years old. Performed at [facility], [address] [place], [place] [address]      Labs: BNP (last 3 results) No results for input(s): BNP in the last 8760 hours. Basic Metabolic Panel: Recent Labs  Lab 11/01/20 1656 11/01/20 1719 11/02/20 0221 11/03/20 0139 11/04/20 0145  NA 134* 138 131* 134* 131*  K 4.4 4.1 3.5 3.8 3.9  CL 99  --  102 103 98  CO2 23  --  GLUCOSE 171*  --  98 84 90  BUN 27*  --  24* 20 13  CREATININE 1.65*  --  1.52* 1.27* 1.12  CALCIUM 9.2  --  8.4* 8.5* 9.0  MG  --   --   --  1.5* 1.7  PHOS  --   --   --   --  3.3   Liver Function Tests: Recent Labs  Lab 11/01/20 1656  AST 36  ALT 29  ALKPHOS 55  BILITOT 0.9  PROT 10.7*  ALBUMIN 2.5*   Recent Labs  Lab 11/01/20 1656  LIPASE 45   No results for input(s): AMMONIA in the last 168 hours. CBC: Recent Labs  Lab 11/01/20 1656 11/01/20 1719 11/02/20 0221 11/03/20 0139 11/04/20 0145 11/05/20 0107  WBC 8.4  --  7.9 6.4 4.6 4.5  NEUTROABS 5.8  --   --   --   --   --   HGB 14.7 16.0 12.8* 11.7* 12.7* 12.6*  HCT 44.9 47.0 39.0 35.1* 38.4* 38.0*  MCV 97.0  --  96.5 96.2 95.0 95.5  PLT 124*  --  162 158 136* 200   Cardiac Enzymes: No results for input(s): CKTOTAL, CKMB, CKMBINDEX, TROPONINI in the last 168 hours. BNP: Invalid input(s): POCBNP CBG: Recent Labs  Lab 11/04/20 2014 11/04/20 2348 11/05/20 0426 11/05/20 0931  11/05/20 1236  GLUCAP 148* 99 136* 173* 92   D-Dimer No results for input(s): DDIMER in the last 72 hours. Hgb A1c No results for input(s): HGBA1C in the last 72 hours. Lipid Profile No results for input(s): CHOL, HDL, LDLCALC, TRIG, CHOLHDL, LDLDIRECT in the last 72 hours. Thyroid function studies No results for input(s): TSH, T4TOTAL, T3FREE, THYROIDAB in the last 72 hours.  Invalid input(s): FREET3 Anemia work up No results for input(s): VITAMINB12, FOLATE, FERRITIN, TIBC, IRON, RETICCTPCT in the last 72 hours. Urinalysis    Component Value  Date/Time   COLORURINE YELLOW 11/01/2020 1848   APPEARANCEUR HAZY (A) 11/01/2020 1848   LABSPEC 1.039 (H) 11/01/2020 1848   PHURINE 6.0 11/01/2020 1848   GLUCOSEU NEGATIVE 11/01/2020 1848   HGBUR SMALL (A) 11/01/2020 1848   BILIRUBINUR NEGATIVE 11/01/2020 1848   KETONESUR NEGATIVE 11/01/2020 1848   PROTEINUR 30 (A) 11/01/2020 1848   UROBILINOGEN 1.0 03/23/2014 1400   NITRITE NEGATIVE 11/01/2020 1848   LEUKOCYTESUR NEGATIVE 11/01/2020 1848   Sepsis Labs Invalid input(s): PROCALCITONIN,  WBC,  LACTICIDVEN Microbiology Recent Results (from the past 240 hour(s))  Resp Panel by RT-PCR (Flu A&B, Covid) Nasopharyngeal Swab     Status: None   Collection Time: 11/01/20  5:02 PM   Specimen: Nasopharyngeal Swab; Nasopharyngeal(NP) swabs in vial transport medium  Result Value Ref Range Status   SARS Coronavirus 2 by RT PCR NEGATIVE NEGATIVE Final    Comment: (NOTE) SARS-CoV-2 target nucleic acids are NOT DETECTED.  The SARS-CoV-2 RNA is generally detectable in upper respiratory specimens during the acute phase of infection. The lowest concentration of SARS-CoV-2 viral copies this assay can detect is 138 copies/mL. A negative result does not preclude SARS-Cov-2 infection and should not be used as the sole basis for treatment or other patient management decisions. A negative result may occur with  improper specimen collection/handling,  submission of specimen other than nasopharyngeal swab, presence of viral mutation(s) within the areas targeted by this assay, and inadequate number of viral copies(<138 copies/mL). A negative result must be combined with clinical observations, patient history, and epidemiological information. The expected result is Negative.  Fact Sheet for Patients:  BloggerCourse.com  Fact Sheet for Healthcare Providers:  SeriousBroker.it  This test is no t yet approved or cleared by the [place] FDA and  has been authorized for detection and/or diagnosis of SARS-CoV-2 by FDA under an Emergency Use Authorization (EUA). This EUA will remain  in effect (meaning this test can be used) for the duration of the COVID-19 declaration under Section 564(b)(1) of the Act, 21 U.S.C.section 360bbb-3(b)(1), unless the authorization is terminated  or revoked sooner.       Influenza A by PCR NEGATIVE NEGATIVE Final   Influenza B by PCR NEGATIVE NEGATIVE Final    Comment: (NOTE) The Xpert Xpress SARS-CoV-2/FLU/RSV plus assay is intended as an aid in the diagnosis of influenza from Nasopharyngeal swab specimens and should not be used as a sole basis for treatment. Nasal washings and aspirates are unacceptable for Xpert Xpress SARS-CoV-2/FLU/RSV testing.  Fact Sheet for Patients: BloggerCourse.com  Fact Sheet for Healthcare Providers: SeriousBroker.it  This test is not yet approved or cleared by the [place] FDA and has been authorized for detection and/or diagnosis of SARS-CoV-2 by FDA under an Emergency Use Authorization (EUA). This EUA will remain in effect (meaning this test can be used) for the duration of the COVID-19 declaration under Section 564(b)(1) of the Act, 21 U.S.C. section 360bbb-3(b)(1), unless the authorization is terminated or revoked.  Performed at [facility], [address], [place], [place] [address]   Blood culture (routine x 2)     Status: None (Preliminary result)   Collection Time: 11/01/20  7:26 PM   Specimen: BLOOD  Result Value Ref Range Status   Specimen Description BLOOD RIGHT ANTECUBITAL  Final   Special Requests   Final    BOTTLES DRAWN AEROBIC AND ANAEROBIC Blood Culture adequate volume   Culture   Final    NO GROWTH 3 DAYS Performed at [facility], [address] [place], [place] [address]    Report Status PENDING  Incomplete  Blood culture (routine x 2)     Status: None (Preliminary result)   Collection Time: 11/01/20  7:26 PM   Specimen: BLOOD LEFT HAND  Result Value Ref Range Status   Specimen Description BLOOD LEFT HAND  Final   Special Requests   Final    BOTTLES DRAWN AEROBIC AND ANAEROBIC Blood Culture adequate volume   Culture   Final    NO GROWTH 3 DAYS Performed at [facility], [address] [place], [place] [address]    Report Status PENDING  Incomplete  Expectorated Sputum Assessment w Gram Stain, Rflx to Resp Cult     Status: None   Collection Time: 11/01/20 11:57 PM   Specimen: Expectorated Sputum  Result Value Ref Range Status   Specimen Description EXPECTORATED SPUTUM  Final   Special Requests NONE  Final   Sputum evaluation   Final    THIS SPECIMEN IS ACCEPTABLE FOR SPUTUM CULTURE Performed at [facility], [address] [place], [place] [address]    Report Status 11/02/2020 FINAL  Final  Culture, Respiratory w Gram Stain     Status: None   Collection Time: 11/01/20 11:57 PM  Result Value Ref Range Status   Specimen Description EXPECTORATED SPUTUM  Final   Special Requests NONE Reflexed from [id number]  Final   Gram Stain   Final    RARE WBC PRESENT,BOTH PMN AND MONONUCLEAR MODERATE GRAM NEGATIVE RODS MODERATE GRAM POSITIVE COCCI IN PAIRS    Culture   Final    FEW Normal respiratory flora-no Staph aureus or Pseudomonas seen Performed at [facility], [address],  [place], [place] [address]    Report Status 11/04/2020 FINAL  Final  MRSA Next Gen by PCR, Nasal     Status: None   Collection Time: 11/01/20 11:59 PM   Specimen: Nasal Mucosa; Nasal Swab  Result Value Ref Range Status   MRSA by PCR Next Gen NOT DETECTED NOT DETECTED Final    Comment: (NOTE) The GeneXpert MRSA Assay (FDA approved for NASAL specimens only), is one component of a comprehensive MRSA colonization surveillance program. It is not intended to diagnose MRSA infection nor to guide or monitor treatment for MRSA infections. Test performance is not FDA approved in patients less than 71 years old. Performed at [facility], [address], [place], [place] [address]      Time coordinating discharge:  32 minutes  SIGNED:   Dorcas Carrow, MD  [facility] 11/05/2020, 12:55 PM

## 2020-11-05 NOTE — Progress Notes (Signed)
Mobility Specialist: Progress Note   11/05/20 1103  Mobility  Activity Ambulated in hall  Level of Assistance Modified independent, requires aide device or extra time  Assistive Device Front wheel walker  Distance Ambulated (ft) 350 ft  Mobility Ambulated with assistance in hallway  Mobility Response Tolerated well  Mobility performed by Mobility specialist  $Mobility charge 1 Mobility   Pre-Mobility on 1 L/min: 105 HR, 100/75 BP, 92% SpO2 During Mobility on RA: 120 HR, 91% SpO2 Post-Mobility on RA: 115 HR, 113/72 BP, 90% SpO2  Pt asx during ambulation. Pt on RA throughout entire walk with sats maintaining >90%. Pt back to bed after walk with call bell at his side.   [facility] Rahn Lacuesta Mobility Specialist Mobility Specialist Phone: [phone]

## 2020-11-05 NOTE — Telephone Encounter (Signed)
Cumulative HIV Genotype Data  Genotype Dates: 09/18/13  RT Mutations  K65R, D67G, M184V, T215I, K219E, A98G, L100I, K103NS,      V108I, P225H  PI Mutations    Integrase Mutations     Interpretation of Genotype Data per [place] HIV Drug Resistance Database:  Nucleoside RTIs  Abacavir - high level resistance Zidovudine - low level resistance Emtricitabine - high level resistance Lamivudine - high level resistance Tenofovir - high level resistance   Non-Nucleoside RTIs  Doravirine - high level resistance Efavirenz - high level resistance Etravirine - intermediate level resistance Nevirapine - high level resistance Rilpivirine - high level resistance   Protease Inhibitors  Atazanavir - N/A Darunavir - N/A Lopinavir - N/A   Integrase Inhibitors  Bictegravir - N/A Cabotegravir - N/A Dolutegravir - N/A Elvitegravir - N/A Raltegravir - N/A   Greg Eckrich L. Janie Capp, PharmD, BCIDP, AAHIVP, CPP Clinical Pharmacist Practitioner Infectious Diseases Clinical Pharmacist [facility] 11/05/2020, 11:07 AM

## 2020-11-05 NOTE — Progress Notes (Addendum)
[facility]    Date of Admission:  11/01/2020   Total days of antibiotics 5   ID: [name] is a 50 y.o. male with   Principal Problem:   Acute respiratory failure with hypoxia ([name]) Active Problems:   HIV (human immunodeficiency virus infection) ([name])   Community acquired pneumonia   AIDS (acquired immune deficiency syndrome) ([name])   Acute pulmonary embolus ([name])   Type 2 diabetes mellitus ([name])   History of TB (tuberculosis)   History of Pneumocystis jirovecii pneumonia   CKD (chronic kidney disease) stage 3, GFR 30-59 ml/min ([name])   Acute pulmonary embolism ([name])   DVT (deep venous thrombosis) ([name])    Subjective: Afebrile.  12 point ros is negative     Medications:   azithromycin  500 mg Oral Daily   Darunavir-Cobicistat-Emtricitabine-Tenofovir Alafenamide  1 tablet Oral Q breakfast   dolutegravir  50 mg Oral Daily   enoxaparin (LOVENOX) injection  120 mg Subcutaneous Q24H   insulin aspart  0-9 Units Subcutaneous Q4H   magnesium oxide  800 mg Oral BID   sulfamethoxazole-trimethoprim  1 tablet Oral Daily    Objective: Vital signs in last 24 hours: Temp:  [98.1 F (36.7 C)-99.1 F (37.3 C)] 98.1 F (36.7 C) (08/23 0820) Pulse Rate:  [85-101] 101 (08/23 0820) Resp:  [17-19] 18 (08/23 0820) BP: (99-117)/(64-79) 110/65 (08/23 0820) SpO2:  [85 %-95 %] 90 % (08/23 0820) Physical Exam  Constitutional: He is oriented to person, place, and time. He appears well-developed and well-nourished. No distress.  HENT:  Mouth/Throat: Oropharynx is clear and moist. No oropharyngeal exudate.  Cardiovascular: Normal rate, regular rhythm and normal heart sounds. Exam reveals no gallop and no friction rub.  No murmur heard.  Pulmonary/Chest: Effort normal and breath sounds normal. No respiratory distress. He has no wheezes.  Abdominal: Soft. Bowel sounds are normal. He exhibits no distension. There is no tenderness.  Lymphadenopathy:  He  has no cervical adenopathy.  Neurological: He is alert and oriented to person, place, and time.  Skin: Skin is warm and dry. No rash noted. No erythema.  Psychiatric: He has a normal mood and affect. His behavior is normal.     Lab Results Recent Labs    11/03/20 0139 11/04/20 0145 11/05/20 0107  WBC 6.4 4.6 4.5  HGB 11.7* 12.7* 12.6*  HCT 35.1* 38.4* 38.0*  NA 134* 131*  --   K 3.8 3.9  --   CL 103 98  --   CO2 24 26  --   BUN 20 13  --   CREATININE 1.27* 1.12  --      Microbiology:  Genotype 2015: K65R, D67G, A98G, L100I, K103N,  K103S, V108I, M184V, T215I, K219E, P225H  -----------------------------------------------------------------------------  Studies/Results: VAS [place] LOWER EXTREMITY VENOUS (DVT)  Result Date: 11/03/2020  Lower Venous DVT Study Patient Name:  [name]  Date of Exam:   11/03/2020 Medical Rec #: [id number]           Accession #:    [id number] Date of Birth: 01-12-1971            Patient Gender: M Patient Age:   55 years Exam Location:  [facility] Procedure:      VAS [place] LOWER EXTREMITY VENOUS (DVT) Referring Phys: Lyda Perone --------------------------------------------------------------------------------  Indications: SOB, and pulmonary embolism.  Risk Factors: History of tuberculosis. HIV/AIDS. Comparison Study: Prior negative bilateral LEV done 02/25/2016 Performing Technologist: Sherren Kerns RVS  Examination  Guidelines: A complete evaluation includes B-mode imaging, spectral Doppler, color Doppler, and power Doppler as needed of all accessible portions of each vessel. Bilateral testing is considered an integral part of a complete examination. Limited examinations for reoccurring indications may be performed as noted. The reflux portion of the exam is performed with the patient in reverse Trendelenburg.  +---------+---------------+---------+-----------+----------+--------------+ RIGHT     CompressibilityPhasicitySpontaneityPropertiesThrombus Aging +---------+---------------+---------+-----------+----------+--------------+ CFV      Full           Yes      Yes                  Rouleaux flow  +---------+---------------+---------+-----------+----------+--------------+ SFJ      Full                                                        +---------+---------------+---------+-----------+----------+--------------+ FV Prox  Full                                                        +---------+---------------+---------+-----------+----------+--------------+ FV Mid   Full                                                        +---------+---------------+---------+-----------+----------+--------------+ FV DistalFull                                                        +---------+---------------+---------+-----------+----------+--------------+ PFV      Full                                                        +---------+---------------+---------+-----------+----------+--------------+ POP      Full           Yes      Yes                                 +---------+---------------+---------+-----------+----------+--------------+ PTV      None                                         Acute          +---------+---------------+---------+-----------+----------+--------------+ PERO     None                                         Acute          +---------+---------------+---------+-----------+----------+--------------+   +---------+---------------+---------+-----------+----------+--------------+ LEFT     CompressibilityPhasicitySpontaneityPropertiesThrombus Aging +---------+---------------+---------+-----------+----------+--------------+  CFV      Full           Yes      Yes                                 +---------+---------------+---------+-----------+----------+--------------+ SFJ      Full                                                         +---------+---------------+---------+-----------+----------+--------------+ FV Prox  Full                                                        +---------+---------------+---------+-----------+----------+--------------+ FV Mid   Full                                                        +---------+---------------+---------+-----------+----------+--------------+ FV DistalFull                                                        +---------+---------------+---------+-----------+----------+--------------+ PFV      Full                                                        +---------+---------------+---------+-----------+----------+--------------+ POP      Full           Yes      Yes                                 +---------+---------------+---------+-----------+----------+--------------+ PTV      Full                                                        +---------+---------------+---------+-----------+----------+--------------+ PERO     Full                                                        +---------+---------------+---------+-----------+----------+--------------+     Summary: RIGHT: - Findings consistent with acute deep vein thrombosis involving the right posterior tibial veins, and right peroneal veins. - No cystic structure found in the popliteal fossa. - Ultrasound characteristics of enlarged lymph nodes are noted in the groin.  LEFT: - There is no evidence of deep vein  thrombosis in the lower extremity.  - No cystic structure found in the popliteal fossa. - Ultrasound characteristics of enlarged lymph nodes noted in the groin.  *See table(s) above for measurements and observations. Electronically signed by Waverly Ferrari MD on 11/03/2020 at 4:02:04 PM.    Final      Assessment/Plan: HIV disease = will continue on symtuza and tivicay daily. Please send RX to [facility] so that patient has medication in hand prior to discharge. We will  have the patient come to clinic early next week for adherence counseling and see if need to change his regimen. Appt set for Monday with ID pharmacist. We will likely need to repeat genotype with Integrase inhibitor to see if needs new regimen.  CAP = finish last dose of ceftriaxone today. Can move up the schedule of administration  OI proph = continue on bactrim DS daily (please refill at [facility])  Pulmonary emboli = due to drug interaction, we will need to continue on lovenox. Please make sure to do teaching and provide medications for him to take over the next 3 months  I am concern about his low health literacy. We will make sure to have him come to clinic often for follow up on HIV disease plus continuing on anticoagulation for PE   [facility] Cell: [phone] Pager: [phone]  11/05/2020, 10:35 AM

## 2020-11-06 ENCOUNTER — Other Ambulatory Visit (HOSPITAL_COMMUNITY): Payer: Self-pay

## 2020-11-06 ENCOUNTER — Other Ambulatory Visit (HOSPITAL_COMMUNITY): Payer: Self-pay | Admitting: Pharmacist

## 2020-11-06 LAB — PROTEIN ELECTRO, RANDOM URINE: Total Protein, Urine: 73.2 mg/dL

## 2020-11-06 LAB — HIV-1 RNA QUANT-NO REFLEX-BLD
HIV 1 RNA Quant: 2070 copies/mL
LOG10 HIV-1 RNA: 3.316 log10copy/mL

## 2020-11-06 MED ORDER — TIVICAY 50 MG PO TABS: 50.0000 mg | ORAL_TABLET | Freq: Every day | ORAL | 5 refills | Status: DC

## 2020-11-06 MED ORDER — SYMTUZA 800-150-200-10 MG PO TABS: 1.0000 | ORAL_TABLET | Freq: Every day | ORAL | 5 refills | Status: DC

## 2020-11-06 NOTE — Progress Notes (Addendum)
Insurance denied one time fill of HIV meds at [facility]. Sent in 30 days of Tivicay and Symtuza to [facility].    Sharin Mons, PharmD, BCPS, BCIDP Infectious Diseases Clinical Pharmacist Phone: [phone] 11/06/2020

## 2020-11-07 ENCOUNTER — Other Ambulatory Visit (HOSPITAL_COMMUNITY): Payer: Self-pay

## 2020-11-07 LAB — CULTURE, BLOOD (ROUTINE X 2)
Culture: NO GROWTH
Culture: NO GROWTH
Special Requests: ADEQUATE
Special Requests: ADEQUATE

## 2020-11-09 LAB — FUNGITELL, SERUM: Fungitell Result: 85 pg/mL — ABNORMAL HIGH (ref <80)

## 2020-11-11 ENCOUNTER — Ambulatory Visit (INDEPENDENT_AMBULATORY_CARE_PROVIDER_SITE_OTHER): Payer: BC Managed Care – PPO | Admitting: Pharmacist

## 2020-11-11 ENCOUNTER — Other Ambulatory Visit (HOSPITAL_COMMUNITY): Payer: Self-pay

## 2020-11-11 ENCOUNTER — Other Ambulatory Visit: Payer: Self-pay

## 2020-11-11 DIAGNOSIS — B20 Human immunodeficiency virus [HIV] disease: Secondary | ICD-10-CM

## 2020-11-11 DIAGNOSIS — I2699 Other pulmonary embolism without acute cor pulmonale: Secondary | ICD-10-CM

## 2020-11-11 MED ORDER — TIVICAY 50 MG PO TABS
50.0000 mg | ORAL_TABLET | Freq: Every day | ORAL | 5 refills | Status: DC
Start: 2020-11-11 — End: 2020-11-22
  Filled 2020-11-11: qty 30, 30d supply, fill #0

## 2020-11-11 MED ORDER — AZITHROMYCIN 600 MG PO TABS: 1200.0000 mg | ORAL_TABLET | ORAL | 1 refills | Status: DC

## 2020-11-11 MED ORDER — SULFAMETHOXAZOLE-TRIMETHOPRIM 800-160 MG PO TABS: 1.0000 | ORAL_TABLET | Freq: Every day | ORAL | 1 refills | Status: DC

## 2020-11-11 MED ORDER — ENOXAPARIN SODIUM 120 MG/0.8ML IJ SOSY: 120.0000 mg | PREFILLED_SYRINGE | INTRAMUSCULAR | 1 refills | Status: DC

## 2020-11-11 MED ORDER — TIVICAY 50 MG PO TABS: 50.0000 mg | ORAL_TABLET | Freq: Every day | ORAL | 5 refills | Status: DC

## 2020-11-11 NOTE — Progress Notes (Signed)
11/11/2020  HPI: [name] is a 50 y.o. male who presents to the [facility] for HIV follow-up.  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   CKD (chronic kidney disease) stage 3, GFR 30-59 ml/min ([place]) 11/01/2020   Acute pulmonary embolism ([place]) 11/01/2020   MSSA bacteremia    Acute respiratory failure with hypoxia ([place]) 05/17/2019   Type 2 diabetes mellitus ([place]) 05/17/2019   DKA (diabetic ketoacidoses) 11/10/2018   Genital warts 05/06/2018   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   Pulmonary emphysema ([place])    Condyloma 01/04/2017   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[place]) 16/12/9602   Chronic systolic CHF (congestive heart failure) ([place]) 04/24/2016   AKI (acute kidney injury) ([place]) 04/24/2016   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)    Normochromic normocytic anemia 02/25/2016   Protein-calorie malnutrition, severe 02/25/2016   Acute pulmonary embolus ([place]) 02/24/2016   Renal insufficiency    AIDS (acquired immune deficiency syndrome) ([place])    S/P ORIF (open reduction internal fixation) fracture 03/21/2014   Community acquired pneumonia 09/27/2013   HIV (human immunodeficiency virus infection) ([place]) 09/18/2013   Refugee health examination 09/18/2013    Patient's Medications  New Prescriptions   No medications on file  Previous Medications   DARUNAVIR-COBICISTAT-EMTRICITABINE-TENOFOVIR ALAFENAMIDE (SYMTUZA) 800-150-200-10 MG TABS    Take 1 tablet by mouth daily with breakfast.   DOLUTEGRAVIR (TIVICAY) 50 MG TABLET    Take 1 tablet (50 mg total) by mouth daily.   ENOXAPARIN (LOVENOX) 120 MG/0.8ML INJECTION    Inject 0.8 mLs (120 mg total) into the skin daily.   SITAGLIPTIN-METFORMIN (JANUMET) 50-500 MG TABLET    Take 1 tablet by mouth 2 (two) times  daily with a meal.   SULFAMETHOXAZOLE-TRIMETHOPRIM (BACTRIM DS) 800-160 MG TABLET    Take 1 tablet by mouth daily.  Modified Medications   No medications on file  Discontinued Medications   No medications on file    Allergies: No Known Allergies  Past Medical History: Past Medical History:  Diagnosis Date   Depression    "stress and depression for any man is common" (03/21/2014)   Diabetes mellitus without complication ([place])    Dyspnea    Genital warts 01/04/2017   Hepatitis    "I don't know what hepatitis I have"   HIV disease ([place])    TB (pulmonary tuberculosis)    previously treated according to refugee documentation    Social History: Social History   Socioeconomic History   Marital status: Single    Spouse name: Not on file   Number of children: Not on file   Years of education: Not on file   Highest education level: Not on file  Occupational History   Not on file  Tobacco Use   Smoking status: Former    Packs/day: 0.50    Years: 6.00    Pack years: 3.00    Types: Cigarettes    Quit date: 03/16/2001    Years since quitting: 19.6   Smokeless tobacco: Never   Tobacco comments:    "quit smoking ~ 2003"  Vaping Use   Vaping Use: Never used  Substance and Sexual Activity   Alcohol use: Yes    Comment: drinks bottled beer intermittently   Drug use: No   Sexual activity: Not Currently  Partners: Female  Other Topics Concern   Not on file  Social History Narrative   As of 09/18/2013:   -Arrived in [place]: September 05, 2013    -Refugee from [facility], spent 4 years in refugee camp in [place].    -Language: [name], requires Pharmacist, community (speaks essentially no [place])   -Education: no formal education, previously worked as a Gaffer: family phone number [phone], caseworker is Laqueta Linden Dar Bi [phone] (with [place])   -lives with daughter and three sons   -denies alcohol, drug, tobacco abuse      [facility] (02/28/16):   Patient  now admitted to drinking bottled beer. Reports continued tobacco abstinence. Denies any bird or mold exposure. No pets currently. No recent travel.   Social Determinants of Health   Financial Resource Strain: Not on file  Food Insecurity: Not on file  Transportation Needs: Not on file  Physical Activity: Not on file  Stress: Not on file  Social Connections: Not on file    Labs: Lab Results  Component Value Date   HIV1RNAQUANT 2,070 11/05/2020   HIV1RNAQUANT 10,800 11/01/2020   HIV1RNAQUANT 6,550 (H) 12/18/2019   CD4TABS <35 (L) 11/04/2020   CD4TABS 99 (L) 12/04/2019   CD4TABS 100 (L) 08/22/2019    RPR and STI Lab Results  Component Value Date   LABRPR NON REACTIVE 11/02/2020   LABRPR REACTIVE (A) 08/22/2019   LABRPR NON-REACTIVE 08/22/2018   LABRPR NON-REACTIVE 05/18/2018   LABRPR NON-REACTIVE 11/30/2016   RPRTITER 1:1 (H) 08/22/2019    STI Results GC CT  Latest Ref Rng & Units NEGATIVE NEGATIVE  03/22/2014 NEGATIVE NEGATIVE    Hepatitis B Lab Results  Component Value Date   HEPBSAG NEGATIVE 09/26/2013   HEPBCAB REACTIVE (A) 09/26/2013   Hepatitis C No results found for: HEPCAB, HCVRNAPCRQN Hepatitis A Lab Results  Component Value Date   HAV REACTIVE (A) 09/26/2013   Lipids: Lab Results  Component Value Date   CHOL 155 08/22/2019   TRIG 50 08/22/2019   HDL 65 08/22/2019   CHOLHDL 2.4 08/22/2019   VLDL 5 03/23/2014   LDLCALC 78 08/22/2019    Current Prescribed HIV Regimen: -Symtuza 1 tab daily with breakfast -Tivicay 1 tab daily  Assessment: [name] presents today for hospital follow up for HIV and is accompanied today by an interpreter. He has been out of his HIV medications for 3 months and has been [facility]. His insurance did not allow him to fill at [facility] at hospital discharge. Reports he has not received a call [facility] to schedule shipment of HIV medications. I tried  calling his phone number in the room with him to see if someone would be able to leave him a voicemail. His voicemail box is not set up and it does not appear there is a way to do so with his phone plan.   [facility]ed [facility] while he was in the office today to resolve these issues. Confirmed they have his correct phone number and address. Got his account with them set up using help of our in person interpreter. If [facility] is unable to reach him to schedule fills, [facility] will call our office and patient has authorized [place] to verify refills for home delivery. Cost for Symtuza through [facility] is $1200/month. Got him a copay card through [place] for [place] which will cover $12,500/year:  GRP: [id number] ID: [id number] BIN: [id number]  [facility]  only [facility] for Symtuza. Resent prescription for Tivicay and they still could not receive it. Sent Tivicay to [facility] but they are also unable to run it. Called [facility] later who still did not have the prescription. Provided verbal prescription which they were able to run. Also provided copay card for Tivicay:  GRP: [id number] ID: [id number] BIN: [id number] PCN: [id number]  Confirmed with [facility] that copay of both medications is $0 and they are both scheduled for delivery on Friday.   Since he has been off medications for >3 months and genotype data from 2015 showed multiple resistance genes, will check genotype/resistance today.   Given that he is not currently on ART and CD4 count <50, will start azithromycin today for MAC prophylaxis. He is currently taking Bactrim twice daily rather than once daily as prescribed for PJP prophylaxis. Educated him on once daily dosing and he confirmed understanding of both Bactrim and new azithromycin regimen. Will check BMET today.   No refills provided at hospital discharge for Lovenox (acute PE/DVT) and Bactrim, which were filled at [facility]. Will [facility] refills [facility] local [facility] today so he does not run out. Confirmed that he is  correctly administering Lovenox and does not have any bruising or bleeding. Denies missed doses. Will check a CBC today.   Plan: -Check BMET, CBC, HIV RNA genotype/resistance -Start Symtuza once daily and Tivicay once daily (scheduled to be delivered 9/2, provided [facility] copay cards for both, now a $0 copay) -Start azithromycin 1200 mg (two 600 mg tablets) once weekly -Continue Bactrim 1 DS tablet daily (educated patient to take once daily rather than twice daily as he is currently doing) -Provided written instructions for medication regimen for Symtuza, Tivicay, Bactrim, azithromycin, and Lovenox. Translated these to [name] and confirmed correct with in person interpreter and confirmed patient understanding.  -Provided refills for Lovenox and Bactrim [facility] local [facility] [address] -F/u in 6 weeks with Cassie on 10/10  Rebbeca Paul, PharmD PGY2 Ambulatory Care Pharmacy Resident 11/11/2020 11:21 AM

## 2020-11-11 NOTE — Patient Instructions (Signed)
[facility] [address]: Bactrim - kibao 1 mara moja kwa siku Azithromycin - vidonge 2 mara moja kwa wiki Lovenox - S[place] 1 mara moja kwa siku  [facility]: Symtuza - kibao 1 mara moja kwa siku Tivicay - kibao 1 mara moja kwa siku     [facility] [address]: Bactrim - 1 tablet once daily Azithromycin - 2 tablets once weekly Lovenox - 1 Injection once daily  [facility]:  Symtuza - 1 tablet once daily Tivicay - 1 tablet once daily

## 2020-11-22 ENCOUNTER — Other Ambulatory Visit (HOSPITAL_COMMUNITY): Payer: Self-pay

## 2020-11-22 ENCOUNTER — Other Ambulatory Visit: Payer: Self-pay | Admitting: *Deleted

## 2020-11-22 ENCOUNTER — Telehealth: Payer: Self-pay

## 2020-11-22 ENCOUNTER — Telehealth: Payer: Self-pay | Admitting: *Deleted

## 2020-11-22 DIAGNOSIS — B20 Human immunodeficiency virus [HIV] disease: Secondary | ICD-10-CM

## 2020-11-22 DIAGNOSIS — E1100 Type 2 diabetes mellitus with hyperosmolarity without nonketotic hyperglycemic-hyperosmolar coma (NKHHC): Secondary | ICD-10-CM

## 2020-11-22 DIAGNOSIS — Z794 Long term (current) use of insulin: Secondary | ICD-10-CM

## 2020-11-22 MED ORDER — SITAGLIPTIN PHOS-METFORMIN HCL 50-500 MG PO TABS: 1.0000 | ORAL_TABLET | Freq: Two times a day (BID) | ORAL | 11 refills | Status: DC

## 2020-11-22 MED ORDER — AZITHROMYCIN 600 MG PO TABS
1200.0000 mg | ORAL_TABLET | ORAL | 0 refills | Status: DC
Start: 2020-11-22 — End: 2021-02-10

## 2020-11-22 MED ORDER — TIVICAY 50 MG PO TABS
50.0000 mg | ORAL_TABLET | Freq: Every day | ORAL | 5 refills | Status: DC
Start: 2020-11-22 — End: 2021-02-10

## 2020-11-22 MED ORDER — SYMTUZA 800-150-200-10 MG PO TABS: 1.0000 | ORAL_TABLET | Freq: Every day | ORAL | 5 refills | Status: DC

## 2020-11-22 MED ORDER — SULFAMETHOXAZOLE-TRIMETHOPRIM 800-160 MG PO TABS: 1.0000 | ORAL_TABLET | Freq: Every day | ORAL | 3 refills | Status: DC

## 2020-11-22 NOTE — Telephone Encounter (Signed)
[facility] Patient Advocate Encounter  Prior Authorization for Thera Flake has been approved.    PA# [id number] Effective dates: 11/22/20 through 03/15/21  Patients co-pay is $94.59.   I was able to get patient a copay card to make copay $5.00        [facility] will continue to follow.  Clearance Coots, CPhT Specialty Pharmacy Patient [facility] Phone: [phone] Fax:  [phone]

## 2020-11-22 NOTE — Telephone Encounter (Signed)
[facility] Patient Advocate Encounter   Received notification from [employer] that prior authorization for Janumet is required.   PA submitted on 11/22/20 Key [id number] Status is pending    [facility] will continue to follow.   Clearance Coots, CPhT Specialty Pharmacy Patient [facility] for [facility]ious Disease Phone: [phone] Fax:  [phone]

## 2020-11-22 NOTE — Telephone Encounter (Signed)
[name] from [employer] calling to report issues with patient's medications. He is now insured, as he started working at [employer] HMAP no longer effective.  Janumet is $500+ out of pocket and patient has not picked it up. [facility] he must use for Tivicay/Symtuza, [name] working with [employer] pharmacist to see if ART is covered. Phone call routed to [facility] pharmacy, call back number [phone]. Andree Coss, RN

## 2020-11-22 NOTE — Progress Notes (Signed)
Lupita Leash obtained prior authorization approval for janumet. She procured copay assistance to cover the patient's part as well. [facility] able to fill non-specialty medication for patient. RN sent azithromycin, bactrim, janumet to [name]; symtuza, [facility] per his insurance requirements. Andree Coss, RN

## 2020-11-22 NOTE — Telephone Encounter (Signed)
Patient will need a step Therapy on Janumet he would have to try and failed 2 other agents (metformin etc...) before they will pay for Janumet.

## 2020-11-28 LAB — CBC
HCT: 40.6 % (ref 38.5–50.0)
Hemoglobin: 13.1 g/dL — ABNORMAL LOW (ref 13.2–17.1)
MCH: 31.6 pg (ref 27.0–33.0)
MCHC: 32.3 g/dL (ref 32.0–36.0)
MCV: 98.1 fL (ref 80.0–100.0)
MPV: 11.7 fL (ref 7.5–12.5)
Platelets: 137 10*3/uL — ABNORMAL LOW (ref 140–400)
RBC: 4.14 10*6/uL — ABNORMAL LOW (ref 4.20–5.80)
RDW: 14.3 % (ref 11.0–15.0)
WBC: 3.8 10*3/uL (ref 3.8–10.8)

## 2020-11-28 LAB — HIV RNA, RTPCR W/R GT (RTI, PI,INT)
HIV 1 RNA Quant: 492 copies/mL — ABNORMAL HIGH
HIV-1 RNA Quant, Log: 2.69 Log copies/mL — ABNORMAL HIGH

## 2020-11-28 LAB — BASIC METABOLIC PANEL
BUN/Creatinine Ratio: 15 (calc) (ref 6–22)
BUN: 27 mg/dL — ABNORMAL HIGH (ref 7–25)
CO2: 27 mmol/L (ref 20–32)
Calcium: 9.3 mg/dL (ref 8.6–10.3)
Chloride: 98 mmol/L (ref 98–110)
Creat: 1.76 mg/dL — ABNORMAL HIGH (ref 0.60–1.29)
Glucose, Bld: 74 mg/dL (ref 65–99)
Potassium: 4.9 mmol/L (ref 3.5–5.3)
Sodium: 133 mmol/L — ABNORMAL LOW (ref 135–146)

## 2020-11-28 LAB — HIV-1 GENOTYPE: HIV-1 Genotype: DETECTED — AB

## 2020-11-28 LAB — HIV-1 INTEGRASE GENOTYPE

## 2020-12-23 ENCOUNTER — Telehealth: Payer: Self-pay | Admitting: Student-PharmD

## 2020-12-23 ENCOUNTER — Ambulatory Visit: Payer: BC Managed Care – PPO | Admitting: Pharmacist

## 2020-12-23 NOTE — Telephone Encounter (Signed)
done

## 2020-12-23 NOTE — Telephone Encounter (Signed)
Called patient using [address] [place], [place] [id number]) and reached him. He forgot about his appointment this morning with Cassie. He is available tomorrow so this has been rescheduled for 10/11 at 11:30am with Marchelle Folks. I asked him to bring all of his medications with him to the appointment tomorrow.   He received his first month of Symtuza and [facility]ay [facility] and it was $0 (we provided [facility] with copay cards at his last visit). This shipment was scheduled to have been delivered 11/15/20 so he should be out of medications by now if he had started then but states he has about 5 days remaining. From [facility] end though, it should show he is out of medication and needed more sent to him. However, he says he has not received any calls or shipments from [facility].   I called [facility] [phone]) and asked if they have record of reaching out to him to set up a refill. The representative, Angie, told me that they have texted and emailed him on 9/26, 9/28, 9/30, 10/4, and 10/6 with instructions for the patient to call a phone number and set up the refill. I asked if this text is sent in [name] because if it is in English how is he supposed to read the text and know he is supposed to call someone. She had to look this up but found that it does come in [place] and she could not answer why, even though his profile says he does not read or speak any English, so it is no wonder he has not called them to set up refills. Also asked why they did not call [facility] to authorize the refill once they didn't hear back from him because last time I spoke with them the patient had authorized [place] to do so if he was not able to be reached, which I confirmed at that time was made clear in his chart. They could not answer this either.   I authorized the refill of both Symtuza and Tivicay over the phone on behalf of the patient and was told this would be delivered on 10/13. I confirmed copays of both these medications are $0.  Since we have yet again run into this issue of [facility] not sending him his medications because they do not make any effort to reach him in his own language, they also changed the primary point of contact for refills to [facility] and they have scheduled to call [phone] on 11/1 to authorize the next month's refill rather than trying and failing to contact the patient.   I then asked to speak to someone to submit a complaint since this exact problem of [facility] not getting in contact with him to send him his medications is why his HIV is uncontrolled and he even was hospitalized in August. I spoke with a Media planner, Nicholos Johns. I explained all that has happened thus far and even that we had previously been told by a representative that no one would bother calling him in [name]. She stated that it is their policy to always call the patient in their language and they have interpreters available 24/7, and she also apologized for all that has occurred over the last few months. She was able to override the delivery date for Symtuza and Tivicay which is now tomorrow, 10/11 so that he gets these medications sooner. I confirmed that no signature is required for delivery. She told me that whenever we have an issue with this again to always ask  for the resolutions team to issue another complaint.   I am hopeful that next month we will be able to get his medications refilled on time with the changes made today during these calls. In the meantime, he will receive his next month refills tomorrow.   Time spent on the phone with patient/interpreter services: 15 minutes  Time spent on phone with [facility]: 52 minutes   Pervis Hocking, PharmD PGY2 Ambulatory Care Pharmacy Resident 12/23/2020 12:54 PM

## 2020-12-23 NOTE — Telephone Encounter (Signed)
Thank you for your hard work with him, [place]! Appreciate the thorough follow up.  Lupita Leash - can you make a note for 11/1 that [facility] is supposed to be reaching out to [place] to verify his refill? Thank you!

## 2020-12-23 NOTE — Telephone Encounter (Signed)
Thank you for working on all of this, [place]! Great job!

## 2020-12-24 ENCOUNTER — Ambulatory Visit: Payer: BC Managed Care – PPO | Admitting: Pharmacist

## 2020-12-24 ENCOUNTER — Other Ambulatory Visit: Payer: Self-pay

## 2020-12-24 DIAGNOSIS — B20 Human immunodeficiency virus [HIV] disease: Secondary | ICD-10-CM

## 2020-12-24 NOTE — Progress Notes (Signed)
HPI: [name] is a 50 y.o. male who presents to the [facility] clinic for HIV follow-up.  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   Pulmonary emphysema ([name])    Condyloma 01/04/2017   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   Chronic systolic CHF (congestive heart failure) ([name]) 04/24/2016   AKI (acute kidney injury) ([name]) 04/24/2016   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)    Normochromic normocytic anemia 02/25/2016   Protein-calorie malnutrition, severe 02/25/2016   Acute pulmonary embolus ([name]) 02/24/2016   Renal insufficiency    AIDS (acquired immune deficiency syndrome) ([name])    S/P ORIF (open reduction internal fixation) fracture 03/21/2014   Community acquired pneumonia 09/27/2013   HIV (human immunodeficiency virus infection) ([name]) 09/18/2013   Refugee health examination 09/18/2013    Patient's Medications  New Prescriptions   No medications on file  Previous Medications   AZITHROMYCIN (ZITHROMAX) 600 MG TABLET    Take 2 tablets (1,200 mg total) by mouth every 7 (seven) days.   DARUNAVIR-COBICISTAT-EMTRICITABINE-TENOFOVIR ALAFENAMIDE (SYMTUZA) 800-150-200-10 MG TABS    Take 1 tablet by mouth daily with breakfast.   DOLUTEGRAVIR (TIVICAY) 50 MG TABLET    Take 1 tablet (50 mg total) by mouth daily.   ENOXAPARIN (LOVENOX) 120 MG/0.8ML INJECTION    Inject 0.8 mLs (120 mg total) into the skin  daily.   SITAGLIPTIN-METFORMIN (JANUMET) 50-500 MG TABLET    Take 1 tablet by mouth 2 (two) times daily with a meal.   SULFAMETHOXAZOLE-TRIMETHOPRIM (BACTRIM DS) 800-160 MG TABLET    Take 1 tablet by mouth daily.  Modified Medications   No medications on file  Discontinued Medications   No medications on file    Allergies: No Known Allergies  Past Medical History: Past Medical History:  Diagnosis Date   Depression    "stress and depression for any man is common" (03/21/2014)   Diabetes mellitus without complication ([name])    Dyspnea    Genital warts 01/04/2017   Hepatitis    "I don't know what hepatitis I have"   HIV disease ([name])    TB (pulmonary tuberculosis)    previously treated according to refugee documentation    Social History: Social History   Socioeconomic History   Marital status: Single    Spouse name: Not on file   Number of children: Not on file   Years of education: Not on file   Highest education level: Not on file  Occupational History   Not on file  Tobacco Use   Smoking status: Former    Packs/day: 0.50    Years: 6.00    Pack years: 3.00    Types: Cigarettes    Quit date: 03/16/2001    Years since quitting: 19.7   Smokeless tobacco: Never   Tobacco comments:    "quit smoking ~ 2003"  Vaping Use   Vaping Use: Never used  Substance and Sexual Activity   Alcohol use: Yes  Comment: drinks bottled beer intermittently   Drug use: No   Sexual activity: Not Currently    Partners: Female  Other Topics Concern   Not on file  Social History Narrative   As of 09/18/2013:   -Arrived in [place]: September 05, 2013    -Refugee from [facility], spent 4 years in refugee camp in [place].    -Language: [name], requires Architect (speaks essentially no [place])   -Education: no formal education, previously worked as a Product/process development scientist: family phone number [phone], caseworker is Lannette Donath Dar Bi [phone] (with [employer])   -lives with daughter and  three sons   -denies alcohol, drug, tobacco abuse      [facility] (02/28/16):   Patient now admitted to drinking bottled beer. Reports continued tobacco abstinence. Denies any bird or mold exposure. No pets currently. No recent travel.   Social Determinants of Health   Financial Resource Strain: Not on file  Food Insecurity: Not on file  Transportation Needs: Not on file  Physical Activity: Not on file  Stress: Not on file  Social Connections: Not on file    Labs: Lab Results  Component Value Date   HIV1RNAQUANT 492 (H) 11/11/2020   HIV1RNAQUANT 2,070 11/05/2020   HIV1RNAQUANT 10,800 11/01/2020   CD4TABS <35 (L) 11/04/2020   CD4TABS 99 (L) 12/04/2019   CD4TABS 100 (L) 08/22/2019    RPR and STI Lab Results  Component Value Date   LABRPR NON REACTIVE 11/02/2020   LABRPR REACTIVE (A) 08/22/2019   LABRPR NON-REACTIVE 08/22/2018   LABRPR NON-REACTIVE 05/18/2018   LABRPR NON-REACTIVE 11/30/2016   RPRTITER 1:1 (H) 08/22/2019    STI Results GC CT  Latest Ref Rng & Units NEGATIVE NEGATIVE  03/22/2014 NEGATIVE NEGATIVE    Hepatitis B Lab Results  Component Value Date   HEPBSAG NEGATIVE 09/26/2013   HEPBCAB REACTIVE (A) 09/26/2013   Hepatitis C No results found for: HEPCAB, HCVRNAPCRQN Hepatitis A Lab Results  Component Value Date   HAV REACTIVE (A) 09/26/2013   Lipids: Lab Results  Component Value Date   CHOL 155 08/22/2019   TRIG 50 08/22/2019   HDL 65 08/22/2019   CHOLHDL 2.4 08/22/2019   VLDL 5 03/23/2014   LDLCALC 78 08/22/2019    Current HIV Regimen: Symtuza + Tivicay   Assessment: [name] presents to clinic today for HIV follow-up. His HIV RNA was 10,000 in the hospital and was 500 at last visit and CD4 count was < 35 in August. He has been taking Symtuza and Tivicay once daily appropriately along with Bactrim once daily. He was confused about the azithromycin and had only been taking 1 tablet weekly. Reviewed appropriate dosing, and he will  take 2 tablets once daily every Tuesday. Verbalized understanding and repeated instructions back to me before leaving today. He recently received his azithromycin and Bactrim as Dr. Drue Second had sent in refills last month. Pervis Hocking was able [facility] yesterday (see telephone note) and have his Symtuza and Tivicay set up for delivery today. He has not received them yet, but I told him to look out for them. He has ~7 tablets in each bottle right now. He states he has been taking them everyday, so I suspect it may have taken a while for [facility] to deliver his first fill. He states he has been tolerating all the medicine well. Will check HIV RNA, CD4,  and Cmet today.  He also states his enoxaparin injections are going well; will  check a CBC today. He has been taking Janumet once daily though it has been increased to twice daily. He verbalized understanding about appropriate instructions.   Will have him follow-up with Judeth Cornfield in 2 months as Dr. Feliz Beam schedule is full. Will set up reminder to call him in one month and assess adherence. Lupita Leash set a reminder to look out for a call from [facility] in a month. We will call them if we do not hear from them in order to set up the next delivery date.  Plan: Continue Tivicay, Symtuza, azithromycin, and Bactrim Check HIV RNA, CD4, CBC, and Cmet Follow-up with phone call in 1 month Follow-up with Judeth Cornfield on 12/15 at 10:00  Margarite Gouge, PharmD, CPP Clinical Pharmacist Practitioner Infectious Diseases Clinical Pharmacist [facility] 12/24/2020, 10:56 AM

## 2020-12-25 LAB — T-HELPER CELLS (CD4) COUNT (NOT AT ARMC)
CD4 % Helper T Cell: 5 % — ABNORMAL LOW (ref 33–65)
CD4 T Cell Abs: 62 /uL — ABNORMAL LOW (ref 400–1790)

## 2020-12-26 LAB — CBC WITH DIFFERENTIAL/PLATELET
Absolute Monocytes: 351 cells/uL (ref 200–950)
Basophils Absolute: 21 cells/uL (ref 0–200)
Basophils Relative: 0.7 %
Eosinophils Absolute: 210 cells/uL (ref 15–500)
Eosinophils Relative: 7 %
HCT: 42.3 % (ref 38.5–50.0)
Hemoglobin: 14.3 g/dL (ref 13.2–17.1)
Lymphs Abs: 1269 cells/uL (ref 850–3900)
MCH: 33.1 pg — ABNORMAL HIGH (ref 27.0–33.0)
MCHC: 33.8 g/dL (ref 32.0–36.0)
MCV: 97.9 fL (ref 80.0–100.0)
MPV: 10.2 fL (ref 7.5–12.5)
Monocytes Relative: 11.7 %
Neutro Abs: 1149 cells/uL — ABNORMAL LOW (ref 1500–7800)
Neutrophils Relative %: 38.3 %
Platelets: 170 10*3/uL (ref 140–400)
RBC: 4.32 10*6/uL (ref 4.20–5.80)
RDW: 13.1 % (ref 11.0–15.0)
Total Lymphocyte: 42.3 %
WBC: 3 10*3/uL — ABNORMAL LOW (ref 3.8–10.8)

## 2020-12-26 LAB — COMPREHENSIVE METABOLIC PANEL
AG Ratio: 0.6 (calc) — ABNORMAL LOW (ref 1.0–2.5)
ALT: 12 U/L (ref 9–46)
AST: 17 U/L (ref 10–40)
Albumin: 3.6 g/dL (ref 3.6–5.1)
Alkaline phosphatase (APISO): 74 U/L (ref 36–130)
BUN: 20 mg/dL (ref 7–25)
CO2: 31 mmol/L (ref 20–32)
Calcium: 9.2 mg/dL (ref 8.6–10.3)
Chloride: 101 mmol/L (ref 98–110)
Creat: 1.24 mg/dL (ref 0.60–1.29)
Globulin: 5.6 g/dL (calc) — ABNORMAL HIGH (ref 1.9–3.7)
Glucose, Bld: 86 mg/dL (ref 65–99)
Potassium: 4.5 mmol/L (ref 3.5–5.3)
Sodium: 137 mmol/L (ref 135–146)
Total Bilirubin: 0.3 mg/dL (ref 0.2–1.2)
Total Protein: 9.2 g/dL — ABNORMAL HIGH (ref 6.1–8.1)

## 2020-12-26 LAB — HIV-1 RNA QUANT-NO REFLEX-BLD
HIV 1 RNA Quant: <20 Copies/mL — ABNORMAL HIGH
HIV-1 RNA Quant, Log: <1.3 Log cps/mL — ABNORMAL HIGH

## 2021-01-20 ENCOUNTER — Telehealth: Payer: Self-pay

## 2021-01-20 NOTE — Telephone Encounter (Signed)
[facility] Patient Advocate Encounter  Patient's medications have been couriered to [facility] from [facility] and will be picked up 01/21/21.  Clearance Coots , CPhT Specialty Pharmacy Patient [facility] for Infectious Disease Phone: [phone] Fax:  [phone]

## 2021-02-06 ENCOUNTER — Inpatient Hospital Stay (HOSPITAL_COMMUNITY)
Admission: EM | Admit: 2021-02-06 | Discharge: 2021-02-10 | DRG: 974 | Disposition: A | Discharge status: Home / Self Care | Payer: BC Managed Care – PPO | Attending: Internal Medicine | Admitting: Internal Medicine

## 2021-02-06 ENCOUNTER — Emergency Department (HOSPITAL_COMMUNITY): Payer: BC Managed Care – PPO

## 2021-02-06 ENCOUNTER — Encounter (HOSPITAL_COMMUNITY): Payer: Self-pay | Admitting: Pharmacy Technician

## 2021-02-06 DIAGNOSIS — R0902 Hypoxemia: Secondary | ICD-10-CM

## 2021-02-06 DIAGNOSIS — R051 Acute cough: Secondary | ICD-10-CM

## 2021-02-06 DIAGNOSIS — M25559 Pain in unspecified hip: Secondary | ICD-10-CM

## 2021-02-06 DIAGNOSIS — L299 Pruritus, unspecified: Secondary | ICD-10-CM | POA: Diagnosis not present

## 2021-02-06 DIAGNOSIS — J189 Pneumonia, unspecified organism: Principal | ICD-10-CM

## 2021-02-06 DIAGNOSIS — E876 Hypokalemia: Secondary | ICD-10-CM | POA: Diagnosis present

## 2021-02-06 DIAGNOSIS — F32A Depression, unspecified: Secondary | ICD-10-CM | POA: Diagnosis present

## 2021-02-06 DIAGNOSIS — J438 Other emphysema: Secondary | ICD-10-CM | POA: Diagnosis present

## 2021-02-06 DIAGNOSIS — J470 Bronchiectasis with acute lower respiratory infection: Secondary | ICD-10-CM | POA: Diagnosis present

## 2021-02-06 DIAGNOSIS — N1831 Chronic kidney disease, stage 3a: Secondary | ICD-10-CM

## 2021-02-06 DIAGNOSIS — Z20822 Contact with and (suspected) exposure to covid-19: Secondary | ICD-10-CM | POA: Diagnosis present

## 2021-02-06 DIAGNOSIS — I2699 Other pulmonary embolism without acute cor pulmonale: Secondary | ICD-10-CM

## 2021-02-06 DIAGNOSIS — N179 Acute kidney failure, unspecified: Secondary | ICD-10-CM | POA: Diagnosis present

## 2021-02-06 DIAGNOSIS — Z9114 Patient's other noncompliance with medication regimen: Secondary | ICD-10-CM

## 2021-02-06 DIAGNOSIS — Z1624 Resistance to multiple antibiotics: Secondary | ICD-10-CM | POA: Diagnosis present

## 2021-02-06 DIAGNOSIS — N183 Chronic kidney disease, stage 3 unspecified: Secondary | ICD-10-CM | POA: Diagnosis present

## 2021-02-06 DIAGNOSIS — Z79899 Other long term (current) drug therapy: Secondary | ICD-10-CM

## 2021-02-06 DIAGNOSIS — B20 Human immunodeficiency virus [HIV] disease: Secondary | ICD-10-CM | POA: Diagnosis present

## 2021-02-06 DIAGNOSIS — Z794 Long term (current) use of insulin: Secondary | ICD-10-CM

## 2021-02-06 DIAGNOSIS — I700 Atherosclerosis of aorta: Secondary | ICD-10-CM | POA: Diagnosis present

## 2021-02-06 DIAGNOSIS — Z91199 Patient's noncompliance with other medical treatment and regimen due to unspecified reason: Secondary | ICD-10-CM

## 2021-02-06 DIAGNOSIS — I5022 Chronic systolic (congestive) heart failure: Secondary | ICD-10-CM | POA: Diagnosis present

## 2021-02-06 DIAGNOSIS — K759 Inflammatory liver disease, unspecified: Secondary | ICD-10-CM | POA: Diagnosis present

## 2021-02-06 DIAGNOSIS — R636 Underweight: Secondary | ICD-10-CM | POA: Diagnosis present

## 2021-02-06 DIAGNOSIS — J9601 Acute respiratory failure with hypoxia: Secondary | ICD-10-CM | POA: Diagnosis not present

## 2021-02-06 DIAGNOSIS — Z86718 Personal history of other venous thrombosis and embolism: Secondary | ICD-10-CM

## 2021-02-06 DIAGNOSIS — I130 Hypertensive heart and chronic kidney disease with heart failure and stage 1 through stage 4 chronic kidney disease, or unspecified chronic kidney disease: Secondary | ICD-10-CM | POA: Diagnosis present

## 2021-02-06 DIAGNOSIS — Z86711 Personal history of pulmonary embolism: Secondary | ICD-10-CM

## 2021-02-06 DIAGNOSIS — Z87891 Personal history of nicotine dependence: Secondary | ICD-10-CM

## 2021-02-06 DIAGNOSIS — R109 Unspecified abdominal pain: Secondary | ICD-10-CM

## 2021-02-06 DIAGNOSIS — E1122 Type 2 diabetes mellitus with diabetic chronic kidney disease: Secondary | ICD-10-CM | POA: Diagnosis present

## 2021-02-06 DIAGNOSIS — J471 Bronchiectasis with (acute) exacerbation: Secondary | ICD-10-CM

## 2021-02-06 DIAGNOSIS — Z8611 Personal history of tuberculosis: Secondary | ICD-10-CM

## 2021-02-06 DIAGNOSIS — Z933 Colostomy status: Secondary | ICD-10-CM

## 2021-02-06 DIAGNOSIS — Z7984 Long term (current) use of oral hypoglycemic drugs: Secondary | ICD-10-CM

## 2021-02-06 DIAGNOSIS — Z6823 Body mass index (BMI) 23.0-23.9, adult: Secondary | ICD-10-CM

## 2021-02-06 LAB — CBC WITH DIFFERENTIAL/PLATELET
Abs Immature Granulocytes: 0.04 10*3/uL (ref 0.00–0.07)
Basophils Absolute: 0 10*3/uL (ref 0.0–0.1)
Basophils Relative: 0 %
Eosinophils Absolute: 0.5 10*3/uL (ref 0.0–0.5)
Eosinophils Relative: 6 %
HCT: 41.5 % (ref 39.0–52.0)
Hemoglobin: 13.7 g/dL (ref 13.0–17.0)
Immature Granulocytes: 1 %
Lymphocytes Relative: 16 %
Lymphs Abs: 1.3 10*3/uL (ref 0.7–4.0)
MCH: 33.4 pg (ref 26.0–34.0)
MCHC: 33 g/dL (ref 30.0–36.0)
MCV: 101.2 fL — ABNORMAL HIGH (ref 80.0–100.0)
Monocytes Absolute: 0.9 10*3/uL (ref 0.1–1.0)
Monocytes Relative: 11 %
Neutro Abs: 5.3 10*3/uL (ref 1.7–7.7)
Neutrophils Relative %: 66 %
Platelets: 180 10*3/uL (ref 150–400)
RBC: 4.1 MIL/uL — ABNORMAL LOW (ref 4.22–5.81)
RDW: 12 % (ref 11.5–15.5)
WBC: 8 10*3/uL (ref 4.0–10.5)
nRBC: 0 % (ref 0.0–0.2)

## 2021-02-06 LAB — RESP PANEL BY RT-PCR (FLU A&B, COVID) ARPGX2
Influenza A by PCR: NEGATIVE
Influenza B by PCR: NEGATIVE
SARS Coronavirus 2 by RT PCR: NEGATIVE

## 2021-02-06 LAB — TROPONIN I (HIGH SENSITIVITY): Troponin I (High Sensitivity): 11 ng/L (ref <18)

## 2021-02-06 LAB — COMPREHENSIVE METABOLIC PANEL
ALT: 11 U/L (ref 0–44)
AST: 19 U/L (ref 15–41)
Albumin: 2.8 g/dL — ABNORMAL LOW (ref 3.5–5.0)
Alkaline Phosphatase: 59 U/L (ref 38–126)
Anion gap: 11 (ref 5–15)
BUN: 20 mg/dL (ref 6–20)
CO2: 28 mmol/L (ref 22–32)
Calcium: 9 mg/dL (ref 8.9–10.3)
Chloride: 98 mmol/L (ref 98–111)
Creatinine, Ser: 1.67 mg/dL — ABNORMAL HIGH (ref 0.61–1.24)
GFR, Estimated: 50 mL/min — ABNORMAL LOW (ref >60)
Glucose, Bld: 107 mg/dL — ABNORMAL HIGH (ref 70–99)
Potassium: 3.6 mmol/L (ref 3.5–5.1)
Sodium: 137 mmol/L (ref 135–145)
Total Bilirubin: 1.1 mg/dL (ref 0.3–1.2)
Total Protein: 8.9 g/dL — ABNORMAL HIGH (ref 6.5–8.1)

## 2021-02-06 LAB — PROCALCITONIN: Procalcitonin: 0.53 ng/mL

## 2021-02-06 LAB — LACTIC ACID, PLASMA: Lactic Acid, Venous: 0.8 mmol/L (ref 0.5–1.9)

## 2021-02-06 LAB — BRAIN NATRIURETIC PEPTIDE: B Natriuretic Peptide: 21.8 pg/mL (ref 0.0–100.0)

## 2021-02-06 MED ORDER — GUAIFENESIN ER 600 MG PO TB12: 600.0000 mg | ORAL_TABLET | Freq: Two times a day (BID) | ORAL | Status: DC | PRN

## 2021-02-06 MED ORDER — ALBUTEROL SULFATE (2.5 MG/3ML) 0.083% IN NEBU: 2.5000 mg | INHALATION_SOLUTION | RESPIRATORY_TRACT | Status: DC | PRN

## 2021-02-06 MED ORDER — SODIUM CHLORIDE 0.9 % IV SOLN
1.0000 g | Freq: Once | INTRAVENOUS | Status: AC
  Administered 2021-02-06: 1 g via INTRAVENOUS
  Filled 2021-02-06: qty 10

## 2021-02-06 MED ORDER — ACETAMINOPHEN 325 MG PO TABS: 650.0000 mg | ORAL_TABLET | Freq: Four times a day (QID) | ORAL | Status: DC | PRN

## 2021-02-06 MED ORDER — SODIUM CHLORIDE 0.9 % IV BOLUS
1000.0000 mL | Freq: Once | INTRAVENOUS | Status: AC
  Administered 2021-02-06: 1000 mL via INTRAVENOUS

## 2021-02-06 MED ORDER — DARUN-COBIC-EMTRICIT-TENOFAF 800-150-200-10 MG PO TABS
1.0000 | ORAL_TABLET | Freq: Every day | ORAL | Status: DC
  Administered 2021-02-08 – 2021-02-10 (×3): 1 via ORAL
  Filled 2021-02-06 (×3): qty 1

## 2021-02-06 MED ORDER — SULFAMETHOXAZOLE-TRIMETHOPRIM 400-80 MG/5ML IV SOLN
480.0000 mg | Freq: Three times a day (TID) | INTRAVENOUS | Status: DC
  Administered 2021-02-07 (×2): 480 mg via INTRAVENOUS
  Filled 2021-02-06 (×5): qty 30

## 2021-02-06 MED ORDER — SODIUM CHLORIDE 0.9 % IV SOLN
2.0000 g | INTRAVENOUS | Status: DC
  Administered 2021-02-07 – 2021-02-10 (×4): 2 g via INTRAVENOUS
  Filled 2021-02-06 (×4): qty 20

## 2021-02-06 MED ORDER — SODIUM CHLORIDE 0.9 % IV SOLN
500.0000 mg | Freq: Once | INTRAVENOUS | Status: DC
  Filled 2021-02-06: qty 500

## 2021-02-06 MED ORDER — LACTATED RINGERS IV SOLN: INTRAVENOUS | Status: DC

## 2021-02-06 MED ORDER — ACETAMINOPHEN 650 MG RE SUPP: 650.0000 mg | Freq: Four times a day (QID) | RECTAL | Status: DC | PRN

## 2021-02-06 MED ORDER — ONDANSETRON HCL 4 MG PO TABS: 4.0000 mg | ORAL_TABLET | Freq: Four times a day (QID) | ORAL | Status: DC | PRN

## 2021-02-06 MED ORDER — SENNOSIDES-DOCUSATE SODIUM 8.6-50 MG PO TABS
1.0000 | ORAL_TABLET | Freq: Every evening | ORAL | Status: DC | PRN
  Administered 2021-02-09: 10:00:00 1 via ORAL
  Filled 2021-02-06: qty 1

## 2021-02-06 MED ORDER — IPRATROPIUM-ALBUTEROL 0.5-2.5 (3) MG/3ML IN SOLN
3.0000 mL | Freq: Once | RESPIRATORY_TRACT | Status: AC
  Administered 2021-02-06: 3 mL via RESPIRATORY_TRACT
  Filled 2021-02-06: qty 3

## 2021-02-06 MED ORDER — DOLUTEGRAVIR SODIUM 50 MG PO TABS
50.0000 mg | ORAL_TABLET | Freq: Every day | ORAL | Status: DC
  Administered 2021-02-07 – 2021-02-10 (×4): 50 mg via ORAL
  Filled 2021-02-06 (×4): qty 1

## 2021-02-06 MED ORDER — IOHEXOL 350 MG/ML SOLN
75.0000 mL | Freq: Once | INTRAVENOUS | Status: AC | PRN
  Administered 2021-02-06: 75 mL via INTRAVENOUS

## 2021-02-06 MED ORDER — ENOXAPARIN SODIUM 80 MG/0.8ML IJ SOSY
80.0000 mg | PREFILLED_SYRINGE | Freq: Two times a day (BID) | INTRAMUSCULAR | Status: DC
  Administered 2021-02-07 – 2021-02-10 (×8): 80 mg via SUBCUTANEOUS
  Filled 2021-02-06 (×8): qty 0.8

## 2021-02-06 MED ORDER — PREDNISONE 20 MG PO TABS
40.0000 mg | ORAL_TABLET | Freq: Two times a day (BID) | ORAL | Status: DC
Start: 2021-02-06 — End: 2021-02-08
  Administered 2021-02-06 – 2021-02-08 (×4): 40 mg via ORAL
  Filled 2021-02-06 (×4): qty 2

## 2021-02-06 MED ORDER — ONDANSETRON HCL 4 MG/2ML IJ SOLN: 4.0000 mg | Freq: Four times a day (QID) | INTRAMUSCULAR | Status: DC | PRN

## 2021-02-06 MED ORDER — SODIUM CHLORIDE 0.9 % IV SOLN
500.0000 mg | Freq: Every day | INTRAVENOUS | Status: DC
  Administered 2021-02-06 – 2021-02-07 (×2): 500 mg via INTRAVENOUS
  Filled 2021-02-06 (×2): qty 500

## 2021-02-06 NOTE — Progress Notes (Signed)
Pharmacy Antibiotic and Anticoagulation Note  [name] is a 50 y.o. male admitted on 02/06/2021 with  Pneumocystis jirovecii pneumonia .  Pharmacy has been consulted for Bactrim dosing.  Pt also to continue Lovenox for h/o PE/DVT in August 2022; of note pt reported to pharmacy tech that his Lovenox regimen has been completed, but there have been no outpt notes that suggest this (VTE treatment is usually at least 109mo).  Plan: Bactrim 480mg  IV Q8H (~18 mg/kg/day TMP). Lovenox 80mg  SQ Q12H.  Height: 6\' 1"  (185.4 cm) Weight: 81.6 kg (180 lb) (from Aug 2022 records) IBW/kg (Calculated) : 79.9  Temp (24hrs), Avg:98.7 F (37.1 C), Min:98.7 F (37.1 C), Max:98.7 F (37.1 C)  Recent Labs  Lab 02/06/21 1633 02/06/21 1710  WBC  --  8.0  CREATININE  --  1.67*  LATICACIDVEN 0.8  --     Estimated Creatinine Clearance: 60.5 mL/min (A) (by C-G formula based on SCr of 1.67 mg/dL (H)).    No Known Allergies   Thank you for allowing pharmacy to be a part of this patient's care.  Sep 2022, PharmD, BCPS  02/06/2021 11:56 PM

## 2021-02-06 NOTE — ED Triage Notes (Signed)
Pt here with reports of cough, shob and bilateral flank pain X3 days. Pt with labored breathing and oxygen saturations of 86% on RA. Placed on 2L [place] with improvement to 95%.

## 2021-02-06 NOTE — ED Provider Notes (Signed)
[facility] EMERGENCY DEPARTMENT Provider Note   CSN: [id number] Arrival date & time: 02/06/21  1602     History Chief Complaint  Patient presents with   Shortness of Breath   Cough   Flank Pain    [name] P Eastman is a 50 y.o. male.  Patient with history of HIV, chronic lung disease, pulmonary embolism, presents with generalized malaise ongoing for a week associate with cough and acutely worsening shortness of breath of the past 2 days.  Denies fevers but has a persistent cough.  He states when he coughs he has flank pain.  Otherwise denies any vomiting or diarrhea.      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   CKD (chronic kidney disease) stage 3, GFR 30-59 ml/min ([place]) 11/01/2020   Acute pulmonary embolism ([place]) 11/01/2020   MSSA bacteremia    Acute respiratory failure with hypoxia ([place]) 05/17/2019   Type 2 diabetes mellitus ([place]) 05/17/2019   DKA (diabetic ketoacidoses) 11/10/2018   Genital warts 05/06/2018   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   Pulmonary emphysema ([place])    Condyloma 01/04/2017   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[place]) [id number]   Chronic systolic CHF (congestive heart failure) ([place]) 04/24/2016   AKI (acute kidney injury) ([place]) 04/24/2016   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)    Normochromic normocytic anemia 02/25/2016   Protein-calorie  malnutrition, severe 02/25/2016   Acute pulmonary embolus ([place]) 02/24/2016   Renal insufficiency    AIDS (acquired immune deficiency syndrome) ([place])    S/P ORIF (open reduction internal fixation) fracture 03/21/2014   Community acquired pneumonia 09/27/2013   HIV (human immunodeficiency virus infection) ([place]) 09/18/2013   Refugee health examination 09/18/2013    Past Surgical History:  Procedure Laterality Date   FRACTURE SURGERY     IM NAILING TIBIA Right 03/21/2014   ORIF ANKLE FRACTURE Right 03/21/2014   lateral malleolus/notes 03/21/2014   ORIF ANKLE FRACTURE Right 03/21/2014   Procedure: OPEN REDUCTION INTERNAL FIXATION (ORIF) pilon ;  Surgeon: Marybelle Killings, MD;  Location: [facility];  Service: Orthopedics;  Laterality: Right;   TEE WITHOUT CARDIOVERSION N/A 05/23/2019   Procedure: TRANSESOPHAGEAL ECHOCARDIOGRAM (TEE);  Surgeon: Skeet Latch, MD;  Location: [facility];  Service: Cardiovascular;  Laterality: N/A;   TIBIA IM NAIL INSERTION Right 03/21/2014   Procedure: INTRAMEDULLARY (IM) NAIL TIBIAL;  Surgeon: Marybelle Killings, MD;  Location: [facility];  Service: Orthopedics;  Laterality: Right;   VIDEO BRONCHOSCOPY Bilateral 03/02/2016   Procedure: VIDEO BRONCHOSCOPY WITHOUT FLUORO;  Surgeon: Javier Glazier, MD;  Location: [facility];  Service: Cardiopulmonary;  Laterality: Bilateral;       Family History  Problem Relation Age of Onset   Hypertension Other    Heart disease Sister     Social History   Tobacco Use   Smoking status: Former    Packs/day: 0.50  Years: 6.00    Pack years: 3.00    Types: Cigarettes    Quit date: 03/16/2001    Years since quitting: 19.9   Smokeless tobacco: Never   Tobacco comments:    "quit smoking ~ 2003"  Vaping Use   Vaping Use: Never used  Substance Use Topics   Alcohol use: Yes    Comment: drinks bottled beer intermittently   Drug use: No    Home Medications Prior to Admission medications   Medication Sig Start Date End Date Taking?  Authorizing Provider  azithromycin (ZITHROMAX) 600 MG tablet Take 2 tablets (1,200 mg total) by mouth every 7 (seven) days. 11/22/20   Carlyle Basques, MD  Darunavir-Cobicistat-Emtricitabine-Tenofovir Alafenamide [facility]) 800-150-200-10 MG TABS Take 1 tablet by mouth daily with breakfast. 11/22/20   Carlyle Basques, MD  dolutegravir (TIVICAY) 50 MG tablet Take 1 tablet (50 mg total) by mouth daily. 11/22/20   Carlyle Basques, MD  enoxaparin (LOVENOX) 120 MG/0.8ML injection Inject 0.8 mLs (120 mg total) into the skin daily. 11/11/20   Kuppelweiser, Cassie L, RPH-CPP  sitaGLIPtin-metformin (JANUMET) 50-500 MG tablet Take 1 tablet by mouth 2 (two) times daily with a meal. 11/22/20   Carlyle Basques, MD  sulfamethoxazole-trimethoprim (BACTRIM DS) 800-160 MG tablet Take 1 tablet by mouth daily. 11/22/20   Carlyle Basques, MD    Allergies    Patient has no known allergies.  Review of Systems   Review of Systems  Constitutional:  Negative for fever.  HENT:  Negative for ear pain and sore throat.   Eyes:  Negative for pain.  Respiratory:  Positive for cough and shortness of breath.   Cardiovascular:  Negative for chest pain.  Gastrointestinal:  Negative for abdominal pain.  Genitourinary:  Negative for hematuria.  Musculoskeletal:  Negative for back pain.  Skin:  Negative for color change and rash.  Neurological:  Negative for syncope.  All other systems reviewed and are negative.  Physical Exam Updated Vital Signs BP 125/81   Pulse 99   Temp 98.7 F (37.1 C)   Resp 18   SpO2 93%   Physical Exam Constitutional:      Appearance: He is well-developed.  HENT:     Head: Normocephalic.     Nose: Nose normal.  Eyes:     Extraocular Movements: Extraocular movements intact.  Cardiovascular:     Rate and Rhythm: Normal rate.  Pulmonary:     Effort: Pulmonary effort is normal.     Comments: Mild wheezes bilaterally Skin:    Coloration: Skin is not jaundiced.  Neurological:     General: No  focal deficit present.     Mental Status: He is alert and oriented to person, place, and time. Mental status is at baseline.     Cranial Nerves: No cranial nerve deficit.     Motor: No weakness.    ED Results / Procedures / Treatments   Labs (all labs ordered are listed, but only abnormal results are displayed) Labs Reviewed  CBC WITH DIFFERENTIAL/PLATELET - Abnormal; Notable for the following components:      Result Value   RBC 4.10 (*)    MCV 101.2 (*)    All other components within normal limits  COMPREHENSIVE METABOLIC PANEL - Abnormal; Notable for the following components:   Glucose, Bld 107 (*)    Creatinine, Ser 1.67 (*)    Total Protein 8.9 (*)    Albumin 2.8 (*)    GFR, Estimated 50 (*)    All other components  within normal limits  RESP PANEL BY RT-PCR (FLU A&B, COVID) [id number]  CULTURE, BLOOD (ROUTINE X 2)  CULTURE, BLOOD (ROUTINE X 2)  BRAIN NATRIURETIC PEPTIDE  LACTIC ACID, PLASMA  PROCALCITONIN  LACTIC ACID, PLASMA  TROPONIN I (HIGH SENSITIVITY)    EKG EKG Interpretation  Date/Time:  Thursday February 06 2021 17:32:24 EST Ventricular Rate:  95 PR Interval:  151 QRS Duration: 100 QT Interval:  368 QTC Calculation: 463 R Axis:   79 Text Interpretation: Sinus rhythm ST elev, probable normal early repol pattern Confirmed by Thamas Jaegers ([id number]) on 02/06/2021 5:52:26 PM  Radiology CT Angio Chest PE W and/or Wo Contrast  Result Date: 02/06/2021 CLINICAL DATA:  Cough. Shortness of breath for 3 days. Decreased oxygen saturation. HIV/aids. EXAM: CT ANGIOGRAPHY CHEST WITH CONTRAST TECHNIQUE: Multidetector CT imaging of the chest was performed using the standard protocol during bolus administration of intravenous contrast. Multiplanar CT image reconstructions and MIPs were obtained to evaluate the vascular anatomy. CONTRAST:  44mL OMNIPAQUE IOHEXOL 350 MG/ML SOLN COMPARISON:  Plain film of earlier today.  CTA chest 11/01/2020 FINDINGS: Cardiovascular: The quality of  this exam for evaluation of pulmonary embolism is moderate to good. No evidence of pulmonary embolism. Aortic atherosclerosis. Normal heart size, without pericardial effusion. Mediastinum/Nodes: Mediastinal nodes of up to 1.1 cm in the AP window are unchanged. Right hilar adenopathy at 1.9 cm on 45/5 is similar. Tiny hiatal hernia. Lungs/Pleura: No pleural fluid. Moderate centrilobular and paraseptal emphysema. Primarily similar appearance of chronic lung disease, as evidenced by areas of cystic bronchiectasis, peribronchovascular nodularity and airspace disease. Minimal right lower lobe dependent airspace disease medially on 70/7 is new. Anterior left upper lobe consolidation on the prior CT has improved. Upper Abdomen: Normal imaged portions of the liver, spleen, pancreas, adrenal glands, kidneys. Musculoskeletal: No acute osseous abnormality. Mild convex right thoracic spine curvature. Review of the MIP images confirms the above findings. IMPRESSION: 1.  No evidence of pulmonary embolism. 2. Relatively similar appearance of peribronchovascular nodularity, airspace disease, bronchiectasis, and architectural distortion. Findings are most consistent with chronic atypical infection, including mycobacterial etiologies. When correlated with the electronic medical record, history includes HIV and tuberculosis. 3. Aortic atherosclerosis (ICD10-I70.0) and emphysema (ICD10-J43.9). 4. Chronic thoracic adenopathy, favored to be reactive. 5. The narrowing of the lingular and left lower lobe bronchi is similar to on the prior exam, favored to be secondary to reactive nodes in this area. Electronically Signed   By: Abigail Miyamoto M.D.   On: 02/06/2021 21:07   DG Chest Port 1 View  Result Date: 02/06/2021 CLINICAL DATA:  Shortness of breath EXAM: PORTABLE CHEST 1 VIEW COMPARISON:  11/01/2020, 05/17/2019 FINDINGS: Stable cardiomediastinal contours. Chronic scarring within the left lung. No definite superimposed airspace  opacity. No pleural effusion or pneumothorax. IMPRESSION: Chronic scarring within the left lung. No definite superimposed acute cardiopulmonary process. Electronically Signed   By: Davina Poke D.O.   On: 02/06/2021 17:28    Procedures .Critical Care Performed by: Luna Fuse, MD Authorized by: Luna Fuse, MD   Critical care provider statement:    Critical care time (minutes):  40   Critical care time was exclusive of:  Separately billable procedures and treating other patients and teaching time   Critical care was necessary to treat or prevent imminent or life-threatening deterioration of the following conditions:  Respiratory failure   Medications Ordered in ED Medications  cefTRIAXone (ROCEPHIN) 1 g in sodium chloride 0.9 % 100 mL IVPB (1 g Intravenous New Bag/Given  02/06/21 2220)  sodium chloride 0.9 % bolus 1,000 mL (1,000 mLs Intravenous Bolus 02/06/21 1727)  iohexol (OMNIPAQUE) 350 MG/ML injection 75 mL (75 mLs Intravenous Contrast Given 02/06/21 2054)  ipratropium-albuterol ([name]) 0.5-2.5 (3) MG/3ML nebulizer solution 3 mL (3 mLs Nebulization Given 02/06/21 2210)    ED Course  I have reviewed the triage vital signs and the nursing notes.  Pertinent labs & imaging results that were available during my care of the patient were reviewed by me and considered in my medical decision making (see chart for details).  Clinical Course as of 02/06/21 2236  Thu Feb 06, 2021  2026 Procalcitonin: 0.53 [JH]    Clinical Course User Index [JH] Cheryll Cockayne, MD   MDM Rules/Calculators/A&P                           Patient arrived tachycardic and hypoxic at 86% on room air requiring 2 L nasal cannula supplementation this is new for him.  Work-up shows no evidence of new pulmonary embolism but CT shows evidence of chronic lung disease.  His Pro-Cal is mildly elevated.  Patient given Rocephin here in the ER.  Given his HIV status differential is wide for different kinds of  lung infection.  Admitted to the hospitalist team for his newfound hypoxemia.  Final Clinical Impression(s) / ED Diagnoses Final diagnoses:  Acute cough  Hypoxemia    Rx / DC Orders ED Discharge Orders     None        Cheryll Cockayne, MD 02/06/21 2236

## 2021-02-06 NOTE — H&P (Signed)
History and Physical    MACKEY VARRICCHIO WUJ:[id number] DOB: [birth year] DOA: 02/06/2021  PCP: Pcp, No  Patient coming from: Home  I have personally briefly reviewed patient's old medical records in [facility]  Chief Complaint: Cough, shortness of breath  HPI: [name] is a 50 y.o. 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  Patient last admitted 11/01/2020-11/05/2020 for acute hypoxic respiratory failure due to atypical versus PJP pneumonia plus newly diagnosed left lower lung PE.  Patient was treated with IV ceftriaxone, azithromycin and restarted on ART, oral Bactrim, and oral azithromycin on discharge.  He was on IV heparin anticoagulation and transition to Lovenox on discharge for management of PE/DVT.  He did not require supplemental oxygen on discharge.  Patient reports 1 week of shortness of breath, cough, malaise, and right flank/groin pain.  He denies any significant chest pain, nausea, vomiting, dysuria, diarrhea.  He states he has been taking his medications.  ED Course:  Initial vitals showed BP 110/78, pulse 106, RR 16, temp 98.7 F, SPO2 86% on room air.  Patient placed on 3 L supplemental O2 via [place] with SPO2 up to 97%.  Labs show WBC 8.0, hemoglobin 13.7, platelets 180,000, sodium 137, potassium 3.6, bicarb 28, BUN 20, creatinine 1.67, serum glucose 107, LFTs within normal limits, lactic acid 0.8, BNP 21.8, high-sensitivity troponin 11, procalcitonin 0.53.  Blood cultures collected and pending.  SARS-CoV-2 and influenza PCR negative.  Portable chest x-ray shows chronic scarring within the left lung without definite superimposed acute cardiopulmonary process.  CTA chest PE study is  negative for evidence of PE.  Similar appearance of peribronchovascular nodularity, airspace disease, bronchiectasis, and architectural distortion seen.  Findings most consistent with chronic atypical infection, including mycobacterial etiologies.  Aortic atherosclerosis, emphysema, chronic thoracic adenopathy favored to be reactive, and narrowing of lingular and left lower lobe bronchi similar to prior exam also favored to be secondary to reactive nodes.  Patient was given 1 L normal saline, [name] treatment, IV ceftriaxone.  The hospitalist service was consulted to admit for further evaluation and management.  Review of Systems: All systems reviewed and are negative except as documented in history of present illness above.   Past Medical History:  Diagnosis Date   Depression    "stress and depression for any man is common" (03/21/2014)   Diabetes mellitus without complication ([name])    Dyspnea    Genital warts 01/04/2017   Hepatitis    "I don't know what hepatitis I have"   HIV disease ([name])    TB (pulmonary tuberculosis)    previously treated according to refugee documentation    Past Surgical History:  Procedure Laterality Date   FRACTURE SURGERY     IM NAILING TIBIA Right 03/21/2014   ORIF ANKLE FRACTURE Right 03/21/2014   lateral malleolus/notes 03/21/2014   ORIF ANKLE FRACTURE Right 03/21/2014   Procedure: OPEN REDUCTION INTERNAL FIXATION (ORIF) pilon ;  Surgeon: Eldred Manges, MD;  Location: [facility] OR;  Service: Orthopedics;  Laterality: Right;   TEE WITHOUT CARDIOVERSION N/A 05/23/2019   Procedure: TRANSESOPHAGEAL ECHOCARDIOGRAM (TEE);  Surgeon: Chilton Si, MD;  Location: [facility] ENDOSCOPY;  Service: Cardiovascular;  Laterality: N/A;   TIBIA IM NAIL INSERTION Right 03/21/2014   Procedure:  INTRAMEDULLARY (IM) NAIL TIBIAL;  Surgeon: Eldred Manges, MD;  Location: [facility] OR;  Service: Orthopedics;  Laterality: Right;   VIDEO BRONCHOSCOPY Bilateral 03/02/2016   Procedure: VIDEO BRONCHOSCOPY WITHOUT  FLUORO;  Surgeon: Roslynn Amble, MD;  Location: [facility] ENDOSCOPY;  Service: Cardiopulmonary;  Laterality: Bilateral;    Social History:  reports that he quit smoking about 19 years ago. His smoking use included cigarettes. He has a 3.00 pack-year smoking history. He has never used smokeless tobacco. He reports current alcohol use. He reports that he does not use drugs.  No Known Allergies  Family History  Problem Relation Age of Onset   Hypertension Other    Heart disease Sister      Prior to Admission medications   Medication Sig Start Date End Date Taking? Authorizing Provider  azithromycin (ZITHROMAX) 600 MG tablet Take 2 tablets (1,200 mg total) by mouth every 7 (seven) days. 11/22/20   Judyann Munson, MD  Darunavir-Cobicistat-Emtricitabine-Tenofovir Alafenamide [facility]) 800-150-200-10 MG TABS Take 1 tablet by mouth daily with breakfast. 11/22/20   Judyann Munson, MD  dolutegravir (TIVICAY) 50 MG tablet Take 1 tablet (50 mg total) by mouth daily. 11/22/20   Judyann Munson, MD  enoxaparin (LOVENOX) 120 MG/0.8ML injection Inject 0.8 mLs (120 mg total) into the skin daily. 11/11/20   Kuppelweiser, Cassie L, RPH-CPP  sitaGLIPtin-metformin (JANUMET) 50-500 MG tablet Take 1 tablet by mouth 2 (two) times daily with a meal. 11/22/20   Judyann Munson, MD  sulfamethoxazole-trimethoprim (BACTRIM DS) 800-160 MG tablet Take 1 tablet by mouth daily. 11/22/20   Judyann Munson, MD    Physical Exam: Vitals:   02/06/21 2030 02/06/21 2122 02/06/21 2204 02/06/21 2230  BP: 103/74 113/84 125/81 123/79  Pulse: 94 95 99 (!) 103  Resp: 17 18 18  (!) 22  Temp:      SpO2: 95% 95% 93% 96%   Constitutional: Thin man resting in bed with head elevated, NAD, calm, comfortable Eyes: PERRL, lids and conjunctivae normal ENMT: Mucous membranes are dry. Posterior pharynx clear of any exudate or lesions.Normal dentition.  Neck: normal, supple, no masses. Respiratory: Coarse inspiratory crackles with faint expiratory  wheezing throughout the lung fields.  Somewhat increased respiratory effort. No accessory muscle use.  Cardiovascular: Regular rate and rhythm, no murmurs / rubs / gallops. No extremity edema. 2+ pedal pulses. Abdomen: S/p colostomy, no tenderness. No hepatosplenomegaly. Bowel sounds positive.  Musculoskeletal: no clubbing / cyanosis. No joint deformity upper and lower extremities. Good ROM, no contractures. Normal muscle tone.  Skin: no rashes, lesions, ulcers. No induration Neurologic: CN 2-12 grossly intact. Sensation intact. Strength 5/5 in all 4.  Psychiatric: Normal judgment and insight. Alert and oriented x 3. Normal mood.   Labs on Admission: I have personally reviewed following labs and imaging studies  CBC: Recent Labs  Lab 02/06/21 1710  WBC 8.0  NEUTROABS 5.3  HGB 13.7  HCT 41.5  MCV 101.2*  PLT 180   Basic Metabolic Panel: Recent Labs  Lab 02/06/21 1710  NA 137  K 3.6  CL 98  CO2 28  GLUCOSE 107*  BUN 20  CREATININE 1.67*  CALCIUM 9.0   GFR: CrCl cannot be calculated (Unknown ideal weight.). Liver Function Tests: Recent Labs  Lab 02/06/21 1710  AST 19  ALT 11  ALKPHOS 59  BILITOT 1.1  PROT 8.9*  ALBUMIN 2.8*   No results for input(s): LIPASE, AMYLASE in the last 168 hours. No results for input(s): AMMONIA in the last 168 hours. Coagulation Profile: No  results for input(s): INR, PROTIME in the last 168 hours. Cardiac Enzymes: No results for input(s): CKTOTAL, CKMB, CKMBINDEX, TROPONINI in the last 168 hours. BNP (last 3 results) No results for input(s): PROBNP in the last 8760 hours. HbA1C: No results for input(s): HGBA1C in the last 72 hours. CBG: No results for input(s): GLUCAP in the last 168 hours. Lipid Profile: No results for input(s): CHOL, HDL, LDLCALC, TRIG, CHOLHDL, LDLDIRECT in the last 72 hours. Thyroid Function Tests: No results for input(s): TSH, T4TOTAL, FREET4, T3FREE, THYROIDAB in the last 72 hours. Anemia Panel: No results  for input(s): VITAMINB12, FOLATE, FERRITIN, TIBC, IRON, RETICCTPCT in the last 72 hours. Urine analysis:    Component Value Date/Time   COLORURINE YELLOW 11/01/2020 1848   APPEARANCEUR HAZY (A) 11/01/2020 1848   LABSPEC 1.039 (H) 11/01/2020 1848   PHURINE 6.0 11/01/2020 1848   GLUCOSEU NEGATIVE 11/01/2020 1848   HGBUR SMALL (A) 11/01/2020 1848   BILIRUBINUR NEGATIVE 11/01/2020 1848   KETONESUR NEGATIVE 11/01/2020 1848   PROTEINUR 30 (A) 11/01/2020 1848   UROBILINOGEN 1.0 03/23/2014 1400   NITRITE NEGATIVE 11/01/2020 1848   LEUKOCYTESUR NEGATIVE 11/01/2020 1848    Radiological Exams on Admission: CT Angio Chest PE W and/or Wo Contrast  Result Date: 02/06/2021 CLINICAL DATA:  Cough. Shortness of breath for 3 days. Decreased oxygen saturation. HIV/aids. EXAM: CT ANGIOGRAPHY CHEST WITH CONTRAST TECHNIQUE: Multidetector CT imaging of the chest was performed using the standard protocol during bolus administration of intravenous contrast. Multiplanar CT image reconstructions and MIPs were obtained to evaluate the vascular anatomy. CONTRAST:  69mL OMNIPAQUE IOHEXOL 350 MG/ML SOLN COMPARISON:  Plain film of earlier today.  CTA chest 11/01/2020 FINDINGS: Cardiovascular: The quality of this exam for evaluation of pulmonary embolism is moderate to good. No evidence of pulmonary embolism. Aortic atherosclerosis. Normal heart size, without pericardial effusion. Mediastinum/Nodes: Mediastinal nodes of up to 1.1 cm in the AP window are unchanged. Right hilar adenopathy at 1.9 cm on 45/5 is similar. Tiny hiatal hernia. Lungs/Pleura: No pleural fluid. Moderate centrilobular and paraseptal emphysema. Primarily similar appearance of chronic lung disease, as evidenced by areas of cystic bronchiectasis, peribronchovascular nodularity and airspace disease. Minimal right lower lobe dependent airspace disease medially on 70/7 is new. Anterior left upper lobe consolidation on the prior CT has improved. Upper Abdomen:  Normal imaged portions of the liver, spleen, pancreas, adrenal glands, kidneys. Musculoskeletal: No acute osseous abnormality. Mild convex right thoracic spine curvature. Review of the MIP images confirms the above findings. IMPRESSION: 1.  No evidence of pulmonary embolism. 2. Relatively similar appearance of peribronchovascular nodularity, airspace disease, bronchiectasis, and architectural distortion. Findings are most consistent with chronic atypical infection, including mycobacterial etiologies. When correlated with the electronic medical record, history includes HIV and tuberculosis. 3. Aortic atherosclerosis (ICD10-I70.0) and emphysema (ICD10-J43.9). 4. Chronic thoracic adenopathy, favored to be reactive. 5. The narrowing of the lingular and left lower lobe bronchi is similar to on the prior exam, favored to be secondary to reactive nodes in this area. Electronically Signed   By: Jeronimo Greaves M.D.   On: 02/06/2021 21:07   DG Chest Port 1 View  Result Date: 02/06/2021 CLINICAL DATA:  Shortness of breath EXAM: PORTABLE CHEST 1 VIEW COMPARISON:  11/01/2020, 05/17/2019 FINDINGS: Stable cardiomediastinal contours. Chronic scarring within the left lung. No definite superimposed airspace opacity. No pleural effusion or pneumothorax. IMPRESSION: Chronic scarring within the left lung. No definite superimposed acute cardiopulmonary process. Electronically Signed   By: Duanne Guess D.O.   On:  02/06/2021 17:28    EKG: Personally reviewed. Normal sinus rhythm without acute ischemic changes.  When compared to prior sinus tachycardia has resolved.  Assessment/Plan Principal Problem:   Acute respiratory failure with hypoxia ([name]) Active Problems:   HIV (human immunodeficiency virus infection) ([name])   AKI (acute kidney injury) ([name])   History of pulmonary embolus (PE)   S/P colostomy ([name])   [name] P Julian is a 50 y.o. 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  Acute respiratory failure with hypoxia: Suspect related to chronic atypical pneumonia as seen on CT imaging.  SPO2 86% on room air on arrival, currently requiring 2-3 L O2 via [place] to maintain SPO2 >92%.  Per medication reconciliation patient not taking azithromycin or Bactrim. -Start IV ceftriaxone and azithromycin -Start Bactrim, PJP treatment dose -Start oral prednisone 40 mg twice daily -Continue supplemental oxygen as needed -Check LDH, strep pneumonia and Legionella urinary antigens  HIV: Labs 12/24/2020 showed CD4 62, HIV RNA <20. -Continue Tivicay and Symtuza  Acute kidney injury: Creatinine 1.67 on admission, baseline creatinine variable but appears to be around 1.2. -S/p 1 L NS, start on IV LR 100 mL/hour overnight -Check abdominal ultrasound  History of PE and RLE DVT: Restart treatment dose Lovenox.  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  DVT prophylaxis: Full dose Lovenox Code Status: Full code Family Communication: Discussed with patient, he has discussed with family Disposition Plan: From home, dispo pending clinical progress Consults called: None Level of care: Med-Surg Admission status:  Status is: Observation  The patient remains OBS appropriate and will d/c before 2 midnights.   Darreld Mclean MD [facility]  If 7PM-7AM, please contact night-coverage [email or web address]  02/06/2021, 11:09 PM

## 2021-02-07 ENCOUNTER — Other Ambulatory Visit: Payer: Self-pay

## 2021-02-07 ENCOUNTER — Encounter (HOSPITAL_COMMUNITY): Payer: Self-pay | Admitting: Internal Medicine

## 2021-02-07 ENCOUNTER — Inpatient Hospital Stay (HOSPITAL_COMMUNITY): Payer: BC Managed Care – PPO

## 2021-02-07 ENCOUNTER — Observation Stay (HOSPITAL_COMMUNITY): Payer: BC Managed Care – PPO

## 2021-02-07 DIAGNOSIS — N183 Chronic kidney disease, stage 3 unspecified: Secondary | ICD-10-CM | POA: Diagnosis present

## 2021-02-07 DIAGNOSIS — Z79899 Other long term (current) drug therapy: Secondary | ICD-10-CM | POA: Diagnosis not present

## 2021-02-07 DIAGNOSIS — I700 Atherosclerosis of aorta: Secondary | ICD-10-CM | POA: Diagnosis present

## 2021-02-07 DIAGNOSIS — I2699 Other pulmonary embolism without acute cor pulmonale: Secondary | ICD-10-CM | POA: Diagnosis not present

## 2021-02-07 DIAGNOSIS — B20 Human immunodeficiency virus [HIV] disease: Secondary | ICD-10-CM | POA: Diagnosis not present

## 2021-02-07 DIAGNOSIS — M25551 Pain in right hip: Secondary | ICD-10-CM

## 2021-02-07 DIAGNOSIS — R109 Unspecified abdominal pain: Secondary | ICD-10-CM | POA: Diagnosis not present

## 2021-02-07 DIAGNOSIS — R636 Underweight: Secondary | ICD-10-CM | POA: Diagnosis present

## 2021-02-07 DIAGNOSIS — N179 Acute kidney failure, unspecified: Secondary | ICD-10-CM | POA: Diagnosis present

## 2021-02-07 DIAGNOSIS — Z86711 Personal history of pulmonary embolism: Secondary | ICD-10-CM | POA: Diagnosis not present

## 2021-02-07 DIAGNOSIS — E876 Hypokalemia: Secondary | ICD-10-CM | POA: Diagnosis present

## 2021-02-07 DIAGNOSIS — Z87891 Personal history of nicotine dependence: Secondary | ICD-10-CM | POA: Diagnosis not present

## 2021-02-07 DIAGNOSIS — Z9114 Patient's other noncompliance with medication regimen: Secondary | ICD-10-CM | POA: Diagnosis not present

## 2021-02-07 DIAGNOSIS — J471 Bronchiectasis with (acute) exacerbation: Secondary | ICD-10-CM | POA: Diagnosis not present

## 2021-02-07 DIAGNOSIS — I5022 Chronic systolic (congestive) heart failure: Secondary | ICD-10-CM | POA: Diagnosis present

## 2021-02-07 DIAGNOSIS — J9601 Acute respiratory failure with hypoxia: Secondary | ICD-10-CM | POA: Diagnosis present

## 2021-02-07 DIAGNOSIS — J438 Other emphysema: Secondary | ICD-10-CM | POA: Diagnosis present

## 2021-02-07 DIAGNOSIS — I130 Hypertensive heart and chronic kidney disease with heart failure and stage 1 through stage 4 chronic kidney disease, or unspecified chronic kidney disease: Secondary | ICD-10-CM | POA: Diagnosis present

## 2021-02-07 DIAGNOSIS — Z8611 Personal history of tuberculosis: Secondary | ICD-10-CM

## 2021-02-07 DIAGNOSIS — E1122 Type 2 diabetes mellitus with diabetic chronic kidney disease: Secondary | ICD-10-CM | POA: Diagnosis present

## 2021-02-07 DIAGNOSIS — J470 Bronchiectasis with acute lower respiratory infection: Secondary | ICD-10-CM | POA: Diagnosis present

## 2021-02-07 DIAGNOSIS — R0902 Hypoxemia: Secondary | ICD-10-CM | POA: Diagnosis present

## 2021-02-07 DIAGNOSIS — Z86718 Personal history of other venous thrombosis and embolism: Secondary | ICD-10-CM | POA: Diagnosis not present

## 2021-02-07 DIAGNOSIS — J189 Pneumonia, unspecified organism: Secondary | ICD-10-CM | POA: Diagnosis present

## 2021-02-07 DIAGNOSIS — M25559 Pain in unspecified hip: Secondary | ICD-10-CM | POA: Diagnosis not present

## 2021-02-07 DIAGNOSIS — Z20822 Contact with and (suspected) exposure to covid-19: Secondary | ICD-10-CM | POA: Diagnosis present

## 2021-02-07 DIAGNOSIS — K759 Inflammatory liver disease, unspecified: Secondary | ICD-10-CM | POA: Diagnosis present

## 2021-02-07 DIAGNOSIS — L299 Pruritus, unspecified: Secondary | ICD-10-CM | POA: Diagnosis not present

## 2021-02-07 DIAGNOSIS — Z1624 Resistance to multiple antibiotics: Secondary | ICD-10-CM | POA: Diagnosis present

## 2021-02-07 DIAGNOSIS — Z91199 Patient's noncompliance with other medical treatment and regimen due to unspecified reason: Secondary | ICD-10-CM | POA: Diagnosis not present

## 2021-02-07 DIAGNOSIS — M25552 Pain in left hip: Secondary | ICD-10-CM

## 2021-02-07 DIAGNOSIS — F32A Depression, unspecified: Secondary | ICD-10-CM | POA: Diagnosis present

## 2021-02-07 LAB — BASIC METABOLIC PANEL
Anion gap: 8 (ref 5–15)
BUN: 13 mg/dL (ref 6–20)
CO2: 27 mmol/L (ref 22–32)
Calcium: 8.4 mg/dL — ABNORMAL LOW (ref 8.9–10.3)
Chloride: 100 mmol/L (ref 98–111)
Creatinine, Ser: 1.22 mg/dL (ref 0.61–1.24)
GFR, Estimated: >60 mL/min (ref >60)
Glucose, Bld: 113 mg/dL — ABNORMAL HIGH (ref 70–99)
Potassium: 3.3 mmol/L — ABNORMAL LOW (ref 3.5–5.1)
Sodium: 135 mmol/L (ref 135–145)

## 2021-02-07 LAB — CBC
HCT: 38.4 % — ABNORMAL LOW (ref 39.0–52.0)
Hemoglobin: 12.7 g/dL — ABNORMAL LOW (ref 13.0–17.0)
MCH: 33.8 pg (ref 26.0–34.0)
MCHC: 33.1 g/dL (ref 30.0–36.0)
MCV: 102.1 fL — ABNORMAL HIGH (ref 80.0–100.0)
Platelets: 134 10*3/uL — ABNORMAL LOW (ref 150–400)
RBC: 3.76 MIL/uL — ABNORMAL LOW (ref 4.22–5.81)
RDW: 11.9 % (ref 11.5–15.5)
WBC: 7.5 10*3/uL (ref 4.0–10.5)
nRBC: 0 % (ref 0.0–0.2)

## 2021-02-07 LAB — LACTATE DEHYDROGENASE: LDH: 162 U/L (ref 98–192)

## 2021-02-07 LAB — LACTIC ACID, PLASMA: Lactic Acid, Venous: 0.8 mmol/L (ref 0.5–1.9)

## 2021-02-07 MED ORDER — POTASSIUM CHLORIDE 20 MEQ PO PACK
40.0000 meq | PACK | Freq: Once | ORAL | Status: AC
  Administered 2021-02-07: 40 meq via ORAL
  Filled 2021-02-07: qty 2

## 2021-02-07 MED ORDER — SULFAMETHOXAZOLE-TRIMETHOPRIM 800-160 MG PO TABS
1.0000 | ORAL_TABLET | Freq: Every day | ORAL | Status: DC
  Administered 2021-02-07 – 2021-02-10 (×4): 1 via ORAL
  Filled 2021-02-07 (×4): qty 1

## 2021-02-07 MED ORDER — DIPHENHYDRAMINE HCL 50 MG/ML IJ SOLN: 12.5000 mg | Freq: Four times a day (QID) | INTRAMUSCULAR | Status: DC | PRN

## 2021-02-07 NOTE — Progress Notes (Signed)
PROGRESS NOTE    [name]  BDZ:[id number] DOB: October 27, 1970 DOA: 02/06/2021 PCP: Pcp, No     Brief Narrative:  [name] is a 50 y.o. 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 Patient last admitted 11/01/2020-11/05/2020 for acute hypoxic respiratory failure due to atypical versus PJP pneumonia plus newly diagnosed left lower lung PE.  Patient was treated with IV ceftriaxone, azithromycin and restarted on ART, oral Bactrim, and oral azithromycin on discharge.  He was on IV heparin anticoagulation and transition to Lovenox on discharge for management of PE/DVT.  He is now admitted due to shortness of breath and cough.  CTA chest revealed findings consistent with chronic atypical infection including mycobacterial etiologies.  He was started on nasal cannula O2 as well as IV antibiotics.  New events last 24 hours / Subjective: Patient seen in the emergency department, using iPad interpreter.  Patient is [name] speaking.  Patient admits to cough, shortness of breath, and generalized itching especially in his arms and his back that started about 3 days ago.  Appears that he has been taking his antivirals, but no longer on his prophylactic antibiotic or Lovenox.  Seems to be noncompliant mostly secondary to language barrier as well as medical illiteracy.  Assessment & Plan:   Principal Problem:   Acute respiratory failure with hypoxia ([name]) Active Problems:   HIV (human immunodeficiency virus infection) ([name])   AKI (acute kidney injury) ([name])   History of pulmonary embolus (PE)   S/P colostomy ([name])   Acute hypoxemic respiratory failure ([name])   Acute hypoxemic respiratory failure, suspected  chronic atypical pneumonia -Was found to have oxygen saturation 86% on room air, required 2 L oxygen, continue to wean as able -Continue Rocephin, azithromycin, Bactrim, prednisone -Infectious disease consulted today, updated Dr. Daiva Eves  HIV -Followed by Dr. 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  Infectious disease consulted    Consultants:  ID  Procedures:  None  Antimicrobials:  Anti-infectives (From admission, onward)    Start     Dose/Rate Route Frequency Ordered Stop   02/07/21 1000  dolutegravir (TIVICAY) tablet 50 mg        50 mg Oral Daily 02/06/21 2321     02/07/21 0800  cefTRIAXone (ROCEPHIN) 2 g in sodium chloride 0.9 % 100 mL IVPB        2 g 200 mL/hr over 30 Minutes Intravenous Every 24 hours 02/06/21 2319 02/12/21 0759   02/07/21 0800  Darunavir-Cobicistat-Emtricitabine-Tenofovir Alafenamide (SYMTUZA) 800-150-200-10 MG TABS 1 tablet        1 tablet Oral Daily with breakfast 02/06/21 2321     02/07/21 0000  sulfamethoxazole-trimethoprim (BACTRIM) 480 mg in dextrose 5 % 500 mL IVPB        480 mg 353.3 mL/hr over 90 Minutes Intravenous Every 8 hours 02/06/21 2355  02/06/21 2330  azithromycin (ZITHROMAX) 500 mg in sodium chloride 0.9 % 250 mL IVPB        500 mg 250 mL/hr over 60 Minutes Intravenous Daily at bedtime 02/06/21 2319 02/11/21 2159   02/06/21 2145  cefTRIAXone (ROCEPHIN) 1 g in sodium chloride 0.9 % 100 mL IVPB        1 g 200 mL/hr over 30 Minutes Intravenous  Once 02/06/21 2143 02/06/21 2250   02/06/21  2145  azithromycin (ZITHROMAX) 500 mg in sodium chloride 0.9 % 250 mL IVPB  Status:  Discontinued        500 mg 250 mL/hr over 60 Minutes Intravenous  Once 02/06/21 2143 02/06/21 2216        Objective: Vitals:   02/07/21 0900 02/07/21 1000 02/07/21 1100 02/07/21 1200  BP: 114/71 114/75 104/74 132/82  Pulse: 95 90 89 98  Resp: 20 (!) 26 17 (!) 23  Temp:      SpO2: 95% 94% 96% 95%  Weight:      Height:        Intake/Output Summary (Last 24 hours) at 02/07/2021 1251 Last data filed at 02/07/2021 0215 Gross per 24 hour  Intake 500 ml  Output --  Net 500 ml   Filed Weights   02/06/21 2300  Weight: 81.6 kg    Examination:  General exam: Appears calm and comfortable  Respiratory system: On nasal cannula O2, respiratory effort is normal without distress, no conversational dyspnea, diminished breath sounds with crackles left lung fields, on nasal cannula O2 Cardiovascular system: S1 & S2 heard, RRR. No murmurs. No pedal edema. Gastrointestinal system: Abdomen is nondistended, soft and nontender. Normal bowel sounds heard. Central nervous system: Alert and oriented. No focal neurological deficits. Speech clear.  Extremities: Symmetric in appearance  Skin: No rashes, lesions or ulcers on exposed skin  Psychiatry: Judgement and insight appear normal. Mood & affect appropriate.   Data Reviewed: I have personally reviewed following labs and imaging studies  CBC: Recent Labs  Lab 02/06/21 1710 02/07/21 0357  WBC 8.0 7.5  NEUTROABS 5.3  --   HGB 13.7 12.7*  HCT 41.5 38.4*  MCV 101.2* 102.1*  PLT 180 134*   Basic Metabolic Panel: Recent Labs  Lab 02/06/21 1710 02/07/21 0357  NA 137 135  K 3.6 3.3*  CL 98 100  CO2 28 27  GLUCOSE 107* 113*  BUN 20 13  CREATININE 1.67* 1.22  CALCIUM 9.0 8.4*   GFR: Estimated Creatinine Clearance: 82.8 mL/min (by C-G formula based on SCr of 1.22 mg/dL). Liver Function Tests: Recent Labs  Lab 02/06/21 1710  AST 19  ALT 11   ALKPHOS 59  BILITOT 1.1  PROT 8.9*  ALBUMIN 2.8*   No results for input(s): LIPASE, AMYLASE in the last 168 hours. No results for input(s): AMMONIA in the last 168 hours. Coagulation Profile: No results for input(s): INR, PROTIME in the last 168 hours. Cardiac Enzymes: No results for input(s): CKTOTAL, CKMB, CKMBINDEX, TROPONINI in the last 168 hours. BNP (last 3 results) No results for input(s): PROBNP in the last 8760 hours. HbA1C: No results for input(s): HGBA1C in the last 72 hours. CBG: No results for input(s): GLUCAP in the last 168 hours. Lipid Profile: No results for input(s): CHOL, HDL, LDLCALC, TRIG, CHOLHDL, LDLDIRECT in the last 72 hours. Thyroid Function Tests: No results for input(s): TSH, T4TOTAL, FREET4, T3FREE, THYROIDAB in the last 72 hours. Anemia Panel: No results for input(s): VITAMINB12, FOLATE, FERRITIN, TIBC, IRON, RETICCTPCT in the  last 72 hours. Sepsis Labs: Recent Labs  Lab 02/06/21 1633 02/06/21 1710 02/07/21 0357  PROCALCITON  --  0.53  --   LATICACIDVEN 0.8  --  0.8    Recent Results (from the past 240 hour(s))  Resp Panel by RT-PCR (Flu A&B, Covid) Nasopharyngeal Swab     Status: None   Collection Time: 02/06/21  4:34 PM   Specimen: Nasopharyngeal Swab; Nasopharyngeal(NP) swabs in vial transport medium  Result Value Ref Range Status   SARS Coronavirus 2 by RT PCR NEGATIVE NEGATIVE Final    Comment: (NOTE) SARS-CoV-2 target nucleic acids are NOT DETECTED.  The SARS-CoV-2 RNA is generally detectable in upper respiratory specimens during the acute phase of infection. The lowest concentration of SARS-CoV-2 viral copies this assay can detect is 138 copies/mL. A negative result does not preclude SARS-Cov-2 infection and should not be used as the sole basis for treatment or other patient management decisions. A negative result may occur with  improper specimen collection/handling, submission of specimen other than nasopharyngeal swab,  presence of viral mutation(s) within the areas targeted by this assay, and inadequate number of viral copies(<138 copies/mL). A negative result must be combined with clinical observations, patient history, and epidemiological information. The expected result is Negative.  Fact Sheet for Patients:  BloggerCourse.com  Fact Sheet for Healthcare Providers:  SeriousBroker.it  This test is no t yet approved or cleared by the [place] FDA and  has been authorized for detection and/or diagnosis of SARS-CoV-2 by FDA under an Emergency Use Authorization (EUA). This EUA will remain  in effect (meaning this test can be used) for the duration of the COVID-19 declaration under Section 564(b)(1) of the Act, 21 U.S.C.section 360bbb-3(b)(1), unless the authorization is terminated  or revoked sooner.       Influenza A by PCR NEGATIVE NEGATIVE Final   Influenza B by PCR NEGATIVE NEGATIVE Final    Comment: (NOTE) The Xpert Xpress SARS-CoV-2/FLU/RSV plus assay is intended as an aid in the diagnosis of influenza from Nasopharyngeal swab specimens and should not be used as a sole basis for treatment. Nasal washings and aspirates are unacceptable for Xpert Xpress SARS-CoV-2/FLU/RSV testing.  Fact Sheet for Patients: BloggerCourse.com  Fact Sheet for Healthcare Providers: SeriousBroker.it  This test is not yet approved or cleared by the [place] FDA and has been authorized for detection and/or diagnosis of SARS-CoV-2 by FDA under an Emergency Use Authorization (EUA). This EUA will remain in effect (meaning this test can be used) for the duration of the COVID-19 declaration under Section 564(b)(1) of the Act, 21 U.S.C. section 360bbb-3(b)(1), unless the authorization is terminated or revoked.  Performed at [facility], [address], [place], [place] [address]       Radiology  Studies: CT Angio Chest PE W and/or Wo Contrast  Result Date: 02/06/2021 CLINICAL DATA:  Cough. Shortness of breath for 3 days. Decreased oxygen saturation. HIV/aids. EXAM: CT ANGIOGRAPHY CHEST WITH CONTRAST TECHNIQUE: Multidetector CT imaging of the chest was performed using the standard protocol during bolus administration of intravenous contrast. Multiplanar CT image reconstructions and MIPs were obtained to evaluate the vascular anatomy. CONTRAST:  19mL OMNIPAQUE IOHEXOL 350 MG/ML SOLN COMPARISON:  Plain film of earlier today.  CTA chest 11/01/2020 FINDINGS: Cardiovascular: The quality of this exam for evaluation of pulmonary embolism is moderate to good. No evidence of pulmonary embolism. Aortic atherosclerosis. Normal heart size, without pericardial effusion. Mediastinum/Nodes: Mediastinal nodes of up to 1.1 cm in the AP window are  unchanged. Right hilar adenopathy at 1.9 cm on 45/5 is similar. Tiny hiatal hernia. Lungs/Pleura: No pleural fluid. Moderate centrilobular and paraseptal emphysema. Primarily similar appearance of chronic lung disease, as evidenced by areas of cystic bronchiectasis, peribronchovascular nodularity and airspace disease. Minimal right lower lobe dependent airspace disease medially on 70/7 is new. Anterior left upper lobe consolidation on the prior CT has improved. Upper Abdomen: Normal imaged portions of the liver, spleen, pancreas, adrenal glands, kidneys. Musculoskeletal: No acute osseous abnormality. Mild convex right thoracic spine curvature. Review of the MIP images confirms the above findings. IMPRESSION: 1.  No evidence of pulmonary embolism. 2. Relatively similar appearance of peribronchovascular nodularity, airspace disease, bronchiectasis, and architectural distortion. Findings are most consistent with chronic atypical infection, including mycobacterial etiologies. When correlated with the electronic medical record, history includes HIV and tuberculosis. 3. Aortic  atherosclerosis (ICD10-I70.0) and emphysema (ICD10-J43.9). 4. Chronic thoracic adenopathy, favored to be reactive. 5. The narrowing of the lingular and left lower lobe bronchi is similar to on the prior exam, favored to be secondary to reactive nodes in this area. Electronically Signed   By: Jeronimo Greaves M.D.   On: 02/06/2021 21:07   US RENAL  Result Date: 02/07/2021 CLINICAL DATA:  Flank pain. EXAM: RENAL / URINARY TRACT ULTRASOUND COMPLETE COMPARISON:  11/01/2020. FINDINGS: Right Kidney: Renal measurements: 10.3 x 4.5 x 4.3 cm = volume: 103.81 mL. Echogenicity within normal limits. No mass or hydronephrosis visualized. Left Kidney: Renal measurements: 10.1 x 4.9 x 5.0 cm = volume: 128.65 mL. Echogenicity within normal limits. No mass or hydronephrosis visualized. Bladder: Appears normal for degree of bladder distention. Other: No abnormality is seen in the region of concern in the right lower quadrant. IMPRESSION: Normal exam. Electronically Signed   By: Thornell Sartorius M.D.   On: 02/07/2021 00:56   DG Chest Port 1 View  Result Date: 02/06/2021 CLINICAL DATA:  Shortness of breath EXAM: PORTABLE CHEST 1 VIEW COMPARISON:  11/01/2020, 05/17/2019 FINDINGS: Stable cardiomediastinal contours. Chronic scarring within the left lung. No definite superimposed airspace opacity. No pleural effusion or pneumothorax. IMPRESSION: Chronic scarring within the left lung. No definite superimposed acute cardiopulmonary process. Electronically Signed   By: Duanne Guess D.O.   On: 02/06/2021 17:28      Scheduled Meds:  Darunavir-Cobicistat-Emtricitabine-Tenofovir Alafenamide  1 tablet Oral Q breakfast   dolutegravir  50 mg Oral Daily   enoxaparin (LOVENOX) injection  80 mg Subcutaneous Q12H   predniSONE  40 mg Oral BID WC   Continuous Infusions:  azithromycin Stopped (02/07/21 0045)   cefTRIAXone (ROCEPHIN)  IV Stopped (02/07/21 0912)   sulfamethoxazole-trimethoprim Stopped (02/07/21 1037)     LOS: 0 days       Time spent: 35 minutes   Noralee Stain, DO [facility] 02/07/2021, 12:51 PM   Available via Epic secure chat 7am-7pm After these hours, please refer to coverage provider listed on [email or web address]

## 2021-02-07 NOTE — Consult Note (Signed)
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  Plan:  Continue ceftriaxone and azithromycin Discontinue IV Bactrim as PCP pneumonia seems very unlikely Start Bactrim double strength tablet daily for PCP prophylaxis He would benefit from establishing care with pulmonary medicine given his chronic emphysematous changes Continue TIVICAY and SYMTUZA as well as Bactrim for PCP prophylaxis After he has completed his treatment for pneumonia with restart his azithromycin for Mycobacterium avium prophylaxis I will recheck viral load and CD4 count today Will consider if there are other options for r xof his thrombo-embolic disease though we will have to negotiate drug drug interactions in particular with SYMTUZA I will obtain plain films of the hips his exam is very underwhelming for pathology in the hips but potentially he could have avascular necrosis  Principal Problem:   Acute respiratory failure with hypoxia ([place]) Active Problems:   HIV (human immunodeficiency virus infection) ([place])   AKI (acute kidney injury) ([place])   History of pulmonary embolus (PE)   S/P colostomy ([place])   Acute hypoxemic respiratory failure ([name])   Scheduled Meds:  Darunavir-Cobicistat-Emtricitabine-Tenofovir Alafenamide  1 tablet Oral Q breakfast   dolutegravir  50 mg Oral Daily   enoxaparin (LOVENOX) injection  80 mg Subcutaneous Q12H    predniSONE  40 mg Oral BID WC   sulfamethoxazole-trimethoprim  1 tablet Oral Daily   Continuous Infusions:  azithromycin Stopped (02/07/21 0045)   cefTRIAXone (ROCEPHIN)  IV Stopped (02/07/21 0912)   PRN Meds:.acetaminophen **OR** acetaminophen, albuterol, diphenhydrAMINE, guaiFENesin, ondansetron **OR** ondansetron (ZOFRAN) IV, senna-docusate  HPI: [name] is a 50 y.o. 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  Present to the hospital shortness of breath and cough.  CT angiogram was performed to rule out pulmonary embolism.  No PE was found but some multifocal peribronchial vascular nodules were seen along with bronchiectasis.  He was started on ceftriaxone and azithromycin as well as Bactrim with concern for possible PCP pneumonia.  When I talked to him today he seemed unaware of the fact that he is supposed to be taking Bactrim once daily for PCP prevention.  That being said his CT scan does not seem consistent with PCP and his LDH is normal.  I suspect the majority of his symptomatology are due to his chronic bronchiectatic pathology.  I am discontinuing his IV Bactrim and putting him on Bactrim double strength tablet for PCP prevention.  Once he completes treatment for commune acquired pneumonia he needs to restart his Mycobacterium AVM prophylaxis given his chronic low CD4 count even after viral suppression.  I will check a viral load and CD4 count today as  well.  He is supposed to be on Lovenox subcu given his history of DVT and pulmonary embolism.  Is complaining of bilateral hip pain but he has excellent range of motion in both hips.  I spent 84 minutes with the patient including  than 50% of the time in face to face counseling of the patient guarding his history of HIV with AIDS his history of TB that was treated before his bronchiectasis personally reviewing his CT angiogram performed on November 24, his past viral load CD4 count genotype along with review of medical records in preparation for the visit and during the visit and in coordination of his care.    Review of Systems: Review of Systems  Constitutional:  Positive for fever. Negative for chills, malaise/fatigue and weight loss.  HENT:  Negative for congestion and sore throat.   Eyes:  Negative for blurred vision and photophobia.  Respiratory:  Positive for cough. Negative for shortness of breath and wheezing.   Cardiovascular:  Positive for chest pain. Negative for palpitations and leg swelling.  Gastrointestinal:  Negative for abdominal pain, blood in stool, constipation, diarrhea, heartburn, melena, nausea and vomiting.  Genitourinary:  Negative for dysuria, flank pain and hematuria.  Musculoskeletal:  Positive for joint pain and myalgias. Negative for back pain and falls.  Skin:  Negative for itching and rash.  Neurological:  Negative for dizziness, focal weakness, loss of consciousness, weakness and headaches.  Endo/Heme/Allergies:  Does not bruise/bleed easily.  Psychiatric/Behavioral:  Negative for depression and suicidal ideas. The patient does not have insomnia.    Past Medical History:  Diagnosis Date   Depression    "stress and depression for any man is common" (03/21/2014)   Diabetes mellitus without complication ([name])    Dyspnea    Genital warts 01/04/2017   Hepatitis    "I don't know what hepatitis I have"   HIV disease ([name])    TB (pulmonary tuberculosis)    previously treated according to refugee documentation    Social History   Tobacco Use   Smoking status: Former    Packs/day: 0.50    Years: 6.00    Pack years: 3.00    Types: Cigarettes    Quit date: 03/16/2001    Years since  quitting: 19.9   Smokeless tobacco: Never   Tobacco comments:    "quit smoking ~ 2003"  Vaping Use   Vaping Use: Never used  Substance Use Topics   Alcohol use: Yes    Comment: drinks bottled beer intermittently   Drug use: No    Family History  Problem Relation Age of Onset   Hypertension Other    Heart disease Sister    No Known Allergies  OBJECTIVE: Blood pressure 107/73, pulse 84, temperature 98.7 F (37.1 C), resp. rate 19, height 6\' 1"  (1.854 m), weight 81.6 kg, SpO2 94 %.  Physical Exam Constitutional:      Appearance: He is well-developed.  HENT:     Head: Normocephalic and atraumatic.     Comments: No thrush Eyes:     Conjunctiva/sclera: Conjunctivae normal.  Cardiovascular:     Rate and Rhythm: Normal rate and regular rhythm.     Heart sounds: No murmur heard.   No friction rub. No gallop.  Pulmonary:     Effort: Pulmonary effort is normal. No respiratory distress.     Breath sounds: No stridor. No wheezing or rhonchi.  Abdominal:     General: There is no distension.     Palpations: Abdomen  is soft.  Musculoskeletal:        General: No tenderness. Normal range of motion.     Cervical back: Normal range of motion and neck supple.  Skin:    General: Skin is warm and dry.     Coloration: Skin is not pale.     Findings: No erythema or rash.  Neurological:     General: No focal deficit present.     Mental Status: He is alert and oriented to person, place, and time.  Psychiatric:        Mood and Affect: Mood normal.        Behavior: Behavior normal.        Thought Content: Thought content normal.        Judgment: Judgment normal.    Lab Results Lab Results  Component Value Date   WBC 7.5 02/07/2021   HGB 12.7 (L) 02/07/2021   HCT 38.4 (L) 02/07/2021   MCV 102.1 (H) 02/07/2021   PLT 134 (L) 02/07/2021    Lab Results  Component Value Date   CREATININE 1.22 02/07/2021   BUN 13 02/07/2021   NA 135 02/07/2021   K 3.3 (L) 02/07/2021   CL 100  02/07/2021   CO2 27 02/07/2021    Lab Results  Component Value Date   ALT 11 02/06/2021   AST 19 02/06/2021   ALKPHOS 59 02/06/2021   BILITOT 1.1 02/06/2021     Microbiology: Recent Results (from the past 240 hour(s))  Resp Panel by RT-PCR (Flu A&B, Covid) Nasopharyngeal Swab     Status: None   Collection Time: 02/06/21  4:34 PM   Specimen: Nasopharyngeal Swab; Nasopharyngeal(NP) swabs in vial transport medium  Result Value Ref Range Status   SARS Coronavirus 2 by RT PCR NEGATIVE NEGATIVE Final    Comment: (NOTE) SARS-CoV-2 target nucleic acids are NOT DETECTED.  The SARS-CoV-2 RNA is generally detectable in upper respiratory specimens during the acute phase of infection. The lowest concentration of SARS-CoV-2 viral copies this assay can detect is 138 copies/mL. A negative result does not preclude SARS-Cov-2 infection and should not be used as the sole basis for treatment or other patient management decisions. A negative result may occur with  improper specimen collection/handling, submission of specimen other than nasopharyngeal swab, presence of viral mutation(s) within the areas targeted by this assay, and inadequate number of viral copies(<138 copies/mL). A negative result must be combined with clinical observations, patient history, and epidemiological information. The expected result is Negative.  Fact Sheet for Patients:  EntrepreneurPulse.com.au  Fact Sheet for Healthcare Providers:  IncredibleEmployment.be  This test is no t yet approved or cleared by the [place] FDA and  has been authorized for detection and/or diagnosis of SARS-CoV-2 by FDA under an Emergency Use Authorization (EUA). This EUA will remain  in effect (meaning this test can be used) for the duration of the COVID-19 declaration under Section 564(b)(1) of the Act, 21 U.S.C.section 360bbb-3(b)(1), unless the authorization is terminated  or revoked sooner.        Influenza A by PCR NEGATIVE NEGATIVE Final   Influenza B by PCR NEGATIVE NEGATIVE Final    Comment: (NOTE) The Xpert Xpress SARS-CoV-2/FLU/RSV plus assay is intended as an aid in the diagnosis of influenza from Nasopharyngeal swab specimens and should not be used as a sole basis for treatment. Nasal washings and aspirates are unacceptable for Xpert Xpress SARS-CoV-2/FLU/RSV testing.  Fact Sheet for Patients: EntrepreneurPulse.com.au  Fact Sheet for Healthcare Providers: IncredibleEmployment.be  This  test is not yet approved or cleared by the [place] and has been authorized for detection and/or diagnosis of SARS-CoV-2 by FDA under an Emergency Use Authorization (EUA). This EUA will remain in effect (meaning this test can be used) for the duration of the COVID-19 declaration under Section 564(b)(1) of the Act, 21 U.S.C. section 360bbb-3(b)(1), unless the authorization is terminated or revoked.  Performed at [facility], [place] [address] [place], [place] [address]     Alcide Evener, [place] [facility] [phone] pager  02/07/2021, 2:46 PM

## 2021-02-08 DIAGNOSIS — R109 Unspecified abdominal pain: Secondary | ICD-10-CM | POA: Diagnosis not present

## 2021-02-08 DIAGNOSIS — M25559 Pain in unspecified hip: Secondary | ICD-10-CM

## 2021-02-08 DIAGNOSIS — J471 Bronchiectasis with (acute) exacerbation: Secondary | ICD-10-CM

## 2021-02-08 DIAGNOSIS — J9601 Acute respiratory failure with hypoxia: Secondary | ICD-10-CM | POA: Diagnosis not present

## 2021-02-08 DIAGNOSIS — Z86711 Personal history of pulmonary embolism: Secondary | ICD-10-CM | POA: Diagnosis not present

## 2021-02-08 LAB — CBC
HCT: 38.3 % — ABNORMAL LOW (ref 39.0–52.0)
Hemoglobin: 12.5 g/dL — ABNORMAL LOW (ref 13.0–17.0)
MCH: 33 pg (ref 26.0–34.0)
MCHC: 32.6 g/dL (ref 30.0–36.0)
MCV: 101.1 fL — ABNORMAL HIGH (ref 80.0–100.0)
Platelets: 162 10*3/uL (ref 150–400)
RBC: 3.79 MIL/uL — ABNORMAL LOW (ref 4.22–5.81)
RDW: 11.9 % (ref 11.5–15.5)
WBC: 7.7 10*3/uL (ref 4.0–10.5)
nRBC: 0 % (ref 0.0–0.2)

## 2021-02-08 LAB — BASIC METABOLIC PANEL
Anion gap: 7 (ref 5–15)
BUN: 19 mg/dL (ref 6–20)
CO2: 26 mmol/L (ref 22–32)
Calcium: 8.9 mg/dL (ref 8.9–10.3)
Chloride: 100 mmol/L (ref 98–111)
Creatinine, Ser: 1.36 mg/dL — ABNORMAL HIGH (ref 0.61–1.24)
GFR, Estimated: >60 mL/min (ref >60)
Glucose, Bld: 129 mg/dL — ABNORMAL HIGH (ref 70–99)
Potassium: 4.1 mmol/L (ref 3.5–5.1)
Sodium: 133 mmol/L — ABNORMAL LOW (ref 135–145)

## 2021-02-08 LAB — MAGNESIUM: Magnesium: 1.9 mg/dL (ref 1.7–2.4)

## 2021-02-08 MED ORDER — ADULT MULTIVITAMIN W/MINERALS CH
1.0000 | ORAL_TABLET | Freq: Every day | ORAL | Status: DC
  Administered 2021-02-08: 1 via ORAL
  Filled 2021-02-08: qty 1

## 2021-02-08 MED ORDER — AZITHROMYCIN 500 MG PO TABS: 500.0000 mg | ORAL_TABLET | Freq: Every day | ORAL | Status: DC

## 2021-02-08 MED ORDER — ENSURE ENLIVE PO LIQD
237.0000 mL | Freq: Two times a day (BID) | ORAL | Status: DC
  Administered 2021-02-08 – 2021-02-10 (×5): 237 mL via ORAL

## 2021-02-08 MED ORDER — PREDNISONE 20 MG PO TABS
40.0000 mg | ORAL_TABLET | Freq: Every day | ORAL | Status: DC
  Administered 2021-02-09: 10:00:00 40 mg via ORAL
  Filled 2021-02-08: qty 2

## 2021-02-08 MED ORDER — DIPHENHYDRAMINE HCL 25 MG PO CAPS
25.0000 mg | ORAL_CAPSULE | Freq: Four times a day (QID) | ORAL | Status: DC | PRN
  Filled 2021-02-08: qty 1

## 2021-02-08 NOTE — Plan of Care (Signed)
  Problem: Activity: Goal: Ability to tolerate increased activity will improve Outcome: Progressing   Problem: Respiratory: Goal: Ability to maintain a clear airway will improve Outcome: Progressing   

## 2021-02-08 NOTE — Progress Notes (Signed)
PROGRESS NOTE    EMAD BRECHTEL  WUJ:[id number] DOB: August 19, 1970 DOA: 02/06/2021 PCP: Pcp, No     Brief Narrative:  [name] is a 50 y.o. 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 Patient last admitted 11/01/2020-11/05/2020 for acute hypoxic respiratory failure due to atypical versus PJP pneumonia plus newly diagnosed left lower lung PE.  Patient was treated with IV ceftriaxone, azithromycin and restarted on ART, oral Bactrim, and oral azithromycin on discharge.  He was on IV heparin anticoagulation and transition to Lovenox on discharge for management of PE/DVT.  He is now admitted due to shortness of breath and cough.  CTA chest revealed findings consistent with chronic atypical infection including mycobacterial etiologies.  He was started on nasal cannula O2 as well as IV antibiotics.  New events last 24 hours / Subjective: Patient evaluated with phone interpreter.  Patient continues to admit to generalized itching (appears he did not receive as needed Benadryl that has been ordered), as well as cough, but overall feeling improved.  Remains on oxygen.  Assessment & Plan:   Principal Problem:   Acute respiratory failure with hypoxia ([name]) Active Problems:   HIV (human immunodeficiency virus infection) ([name])   AKI (acute kidney injury) ([name])   History of pulmonary embolus (PE)   S/P colostomy ([name])   Acute hypoxemic respiratory failure ([name])   Acute hypoxemic respiratory failure, community-acquired pneumonia -Was found to have oxygen saturation 86% on room air, required 2 L oxygen, continue to wean as able -Continue Rocephin, azithromycin, prednisone -Appreciate infectious  disease -Recommend that he establish care with pulmonary medicine due to his chronic emphysematous changes as outpatient  HIV -Followed by Dr. 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      Consultants:  ID  Procedures:  None  Antimicrobials:  Anti-infectives (From admission, onward)    Start     Dose/Rate Route Frequency Ordered Stop   02/08/21 1015  azithromycin (ZITHROMAX) tablet 500 mg  Status:  Discontinued        500 mg Oral Daily 02/08/21 0919 02/08/21 0920   02/07/21 1630  sulfamethoxazole-trimethoprim (BACTRIM DS) 800-160 MG per tablet 1 tablet        1 tablet Oral Daily 02/07/21 1445     02/07/21 1000  dolutegravir (TIVICAY) tablet 50 mg        50 mg Oral Daily 02/06/21 2321     02/07/21 0800  cefTRIAXone (ROCEPHIN) 2 g in sodium chloride 0.9 % 100 mL IVPB        2 g 200 mL/hr over 30 Minutes Intravenous Every 24 hours 02/06/21 2319 02/12/21 0759   02/07/21 0800  Darunavir-Cobicistat-Emtricitabine-Tenofovir Alafenamide (SYMTUZA) 800-150-200-10 MG TABS 1 tablet        1 tablet Oral Daily with breakfast 02/06/21 2321     02/07/21 0000  sulfamethoxazole-trimethoprim (BACTRIM) 480 mg in dextrose  5 % 500 mL IVPB  Status:  Discontinued        480 mg 353.3 mL/hr over 90 Minutes Intravenous Every 8 hours 02/06/21 2355 02/07/21 1445   02/06/21 2330  azithromycin (ZITHROMAX) 500 mg in sodium chloride 0.9 % 250 mL IVPB  Status:  Discontinued        500 mg 250 mL/hr over 60 Minutes Intravenous Daily at bedtime 02/06/21 2319 02/08/21 0919   02/06/21 2145   cefTRIAXone (ROCEPHIN) 1 g in sodium chloride 0.9 % 100 mL IVPB        1 g 200 mL/hr over 30 Minutes Intravenous  Once 02/06/21 2143 02/06/21 2250   02/06/21 2145  azithromycin (ZITHROMAX) 500 mg in sodium chloride 0.9 % 250 mL IVPB  Status:  Discontinued        500 mg 250 mL/hr over 60 Minutes Intravenous  Once 02/06/21 2143 02/06/21 2216        Objective: Vitals:   02/07/21 1826 02/07/21 2057 02/08/21 0426 02/08/21 0849  BP: 116/78 116/83 115/87 119/85  Pulse: 86 86 85 78  Resp: 20 18 20 17   Temp: 98.4 F (36.9 C)  98 F (36.7 C) 97.8 F (36.6 C)  TempSrc: Oral  Oral Oral  SpO2: 96% 92% 93% 93%  Weight:      Height:        Intake/Output Summary (Last 24 hours) at 02/08/2021 1105 Last data filed at 02/08/2021 0856 Gross per 24 hour  Intake 714.84 ml  Output 2150 ml  Net -1435.16 ml    Filed Weights   02/06/21 2300  Weight: 81.6 kg    Examination:  General exam: Appears calm and comfortable  Respiratory system: Diminished breath sounds without increase in respiratory effort, on nasal cannula O2 Cardiovascular system: S1 & S2 heard, RRR. No murmurs. No pedal edema. Gastrointestinal system: Abdomen is nondistended, soft and nontender. Normal bowel sounds heard. Central nervous system: Alert and oriented. No focal neurological deficits. Speech clear.  Extremities: Symmetric in appearance  Skin: No rashes, lesions or ulcers on exposed skin  Psychiatry: Judgement and insight appear normal. Mood & affect appropriate.   Data Reviewed: I have personally reviewed following labs and imaging studies  CBC: Recent Labs  Lab 02/06/21 1710 02/07/21 0357 02/08/21 0127  WBC 8.0 7.5 7.7  NEUTROABS 5.3  --   --   HGB 13.7 12.7* 12.5*  HCT 41.5 38.4* 38.3*  MCV 101.2* 102.1* 101.1*  PLT 180 134* 162    Basic Metabolic Panel: Recent Labs  Lab 02/06/21 1710 02/07/21 0357 02/08/21 0127  NA 137 135 133*  K 3.6 3.3* 4.1  CL 98 100 100  CO2 28 27 26   GLUCOSE 107*  113* 129*  BUN 20 13 19   CREATININE 1.67* 1.22 1.36*  CALCIUM 9.0 8.4* 8.9  MG  --   --  1.9    GFR: Estimated Creatinine Clearance: 74.3 mL/min (A) (by C-G formula based on SCr of 1.36 mg/dL (H)). Liver Function Tests: Recent Labs  Lab 02/06/21 1710  AST 19  ALT 11  ALKPHOS 59  BILITOT 1.1  PROT 8.9*  ALBUMIN 2.8*    No results for input(s): LIPASE, AMYLASE in the last 168 hours. No results for input(s): AMMONIA in the last 168 hours. Coagulation Profile: No results for input(s): INR, PROTIME in the last 168 hours. Cardiac Enzymes: No results for input(s): CKTOTAL, CKMB, CKMBINDEX, TROPONINI in the last 168 hours. BNP (last 3 results) No results for input(s): PROBNP in the  last 8760 hours. HbA1C: No results for input(s): HGBA1C in the last 72 hours. CBG: No results for input(s): GLUCAP in the last 168 hours. Lipid Profile: No results for input(s): CHOL, HDL, LDLCALC, TRIG, CHOLHDL, LDLDIRECT in the last 72 hours. Thyroid Function Tests: No results for input(s): TSH, T4TOTAL, FREET4, T3FREE, THYROIDAB in the last 72 hours. Anemia Panel: No results for input(s): VITAMINB12, FOLATE, FERRITIN, TIBC, IRON, RETICCTPCT in the last 72 hours. Sepsis Labs: Recent Labs  Lab 02/06/21 1633 02/06/21 1710 02/07/21 0357  PROCALCITON  --  0.53  --   LATICACIDVEN 0.8  --  0.8     Recent Results (from the past 240 hour(s))  Resp Panel by RT-PCR (Flu A&B, Covid) Nasopharyngeal Swab     Status: None   Collection Time: 02/06/21  4:34 PM   Specimen: Nasopharyngeal Swab; Nasopharyngeal(NP) swabs in vial transport medium  Result Value Ref Range Status   SARS Coronavirus 2 by RT PCR NEGATIVE NEGATIVE Final    Comment: (NOTE) SARS-CoV-2 target nucleic acids are NOT DETECTED.  The SARS-CoV-2 RNA is generally detectable in upper respiratory specimens during the acute phase of infection. The lowest concentration of SARS-CoV-2 viral copies this assay can detect is 138 copies/mL. A  negative result does not preclude SARS-Cov-2 infection and should not be used as the sole basis for treatment or other patient management decisions. A negative result may occur with  improper specimen collection/handling, submission of specimen other than nasopharyngeal swab, presence of viral mutation(s) within the areas targeted by this assay, and inadequate number of viral copies(<138 copies/mL). A negative result must be combined with clinical observations, patient history, and epidemiological information. The expected result is Negative.  Fact Sheet for Patients:  BloggerCourse.com  Fact Sheet for Healthcare Providers:  SeriousBroker.it  This test is no t yet approved or cleared by the [place] FDA and  has been authorized for detection and/or diagnosis of SARS-CoV-2 by FDA under an Emergency Use Authorization (EUA). This EUA will remain  in effect (meaning this test can be used) for the duration of the COVID-19 declaration under Section 564(b)(1) of the Act, 21 U.S.C.section 360bbb-3(b)(1), unless the authorization is terminated  or revoked sooner.       Influenza A by PCR NEGATIVE NEGATIVE Final   Influenza B by PCR NEGATIVE NEGATIVE Final    Comment: (NOTE) The Xpert Xpress SARS-CoV-2/FLU/RSV plus assay is intended as an aid in the diagnosis of influenza from Nasopharyngeal swab specimens and should not be used as a sole basis for treatment. Nasal washings and aspirates are unacceptable for Xpert Xpress SARS-CoV-2/FLU/RSV testing.  Fact Sheet for Patients: BloggerCourse.com  Fact Sheet for Healthcare Providers: SeriousBroker.it  This test is not yet approved or cleared by the [place] FDA and has been authorized for detection and/or diagnosis of SARS-CoV-2 by FDA under an Emergency Use Authorization (EUA). This EUA will remain in effect (meaning this test can  be used) for the duration of the COVID-19 declaration under Section 564(b)(1) of the Act, 21 U.S.C. section 360bbb-3(b)(1), unless the authorization is terminated or revoked.  Performed at [facility], [address], [place], [place] [address]   Culture, blood (routine x 2)     Status: None (Preliminary result)   Collection Time: 02/06/21  5:10 PM   Specimen: BLOOD  Result Value Ref Range Status   Specimen Description BLOOD SITE NOT SPECIFIED  Final   Special Requests   Final    BOTTLES DRAWN AEROBIC AND ANAEROBIC Blood  Culture results may not be optimal due to an inadequate volume of blood received in culture bottles   Culture   Final    NO GROWTH 2 DAYS Performed at [facility], [address], [place], [place] [address]    Report Status PENDING  Incomplete  Culture, blood (routine x 2)     Status: None (Preliminary result)   Collection Time: 02/06/21  5:20 PM   Specimen: BLOOD  Result Value Ref Range Status   Specimen Description BLOOD LEFT ANTECUBITAL  Final   Special Requests   Final    BOTTLES DRAWN AEROBIC AND ANAEROBIC Blood Culture adequate volume   Culture   Final    NO GROWTH 2 DAYS Performed at [facility], [address], [place], [place] [address]    Report Status PENDING  Incomplete       Radiology Studies: DG Pelvis 1-2 Views  Result Date: 02/07/2021 CLINICAL DATA:  Fall. EXAM: PELVIS - 1-2 VIEW COMPARISON:  None. FINDINGS: There is no evidence of pelvic fracture or diastasis. No pelvic bone lesions are seen. IMPRESSION: Negative. Electronically Signed   By: Darliss Cheney M.D.   On: 02/07/2021 15:23   CT Angio Chest PE W and/or Wo Contrast  Result Date: 02/06/2021 CLINICAL DATA:  Cough. Shortness of breath for 3 days. Decreased oxygen saturation. HIV/aids. EXAM: CT ANGIOGRAPHY CHEST WITH CONTRAST TECHNIQUE: Multidetector CT imaging of the chest was performed using the standard protocol during bolus administration of intravenous  contrast. Multiplanar CT image reconstructions and MIPs were obtained to evaluate the vascular anatomy. CONTRAST:  29mL OMNIPAQUE IOHEXOL 350 MG/ML SOLN COMPARISON:  Plain film of earlier today.  CTA chest 11/01/2020 FINDINGS: Cardiovascular: The quality of this exam for evaluation of pulmonary embolism is moderate to good. No evidence of pulmonary embolism. Aortic atherosclerosis. Normal heart size, without pericardial effusion. Mediastinum/Nodes: Mediastinal nodes of up to 1.1 cm in the AP window are unchanged. Right hilar adenopathy at 1.9 cm on 45/5 is similar. Tiny hiatal hernia. Lungs/Pleura: No pleural fluid. Moderate centrilobular and paraseptal emphysema. Primarily similar appearance of chronic lung disease, as evidenced by areas of cystic bronchiectasis, peribronchovascular nodularity and airspace disease. Minimal right lower lobe dependent airspace disease medially on 70/7 is new. Anterior left upper lobe consolidation on the prior CT has improved. Upper Abdomen: Normal imaged portions of the liver, spleen, pancreas, adrenal glands, kidneys. Musculoskeletal: No acute osseous abnormality. Mild convex right thoracic spine curvature. Review of the MIP images confirms the above findings. IMPRESSION: 1.  No evidence of pulmonary embolism. 2. Relatively similar appearance of peribronchovascular nodularity, airspace disease, bronchiectasis, and architectural distortion. Findings are most consistent with chronic atypical infection, including mycobacterial etiologies. When correlated with the electronic medical record, history includes HIV and tuberculosis. 3. Aortic atherosclerosis (ICD10-I70.0) and emphysema (ICD10-J43.9). 4. Chronic thoracic adenopathy, favored to be reactive. 5. The narrowing of the lingular and left lower lobe bronchi is similar to on the prior exam, favored to be secondary to reactive nodes in this area. Electronically Signed   By: Jeronimo Greaves M.D.   On: 02/06/2021 21:07   US  RENAL  Result Date: 02/07/2021 CLINICAL DATA:  Flank pain. EXAM: RENAL / URINARY TRACT ULTRASOUND COMPLETE COMPARISON:  11/01/2020. FINDINGS: Right Kidney: Renal measurements: 10.3 x 4.5 x 4.3 cm = volume: 103.81 mL. Echogenicity within normal limits. No mass or hydronephrosis visualized. Left Kidney: Renal measurements: 10.1 x 4.9 x 5.0 cm = volume: 128.65 mL. Echogenicity within normal limits. No mass  or hydronephrosis visualized. Bladder: Appears normal for degree of bladder distention. Other: No abnormality is seen in the region of concern in the right lower quadrant. IMPRESSION: Normal exam. Electronically Signed   By: Thornell Sartorius M.D.   On: 02/07/2021 00:56   DG Chest Port 1 View  Result Date: 02/06/2021 CLINICAL DATA:  Shortness of breath EXAM: PORTABLE CHEST 1 VIEW COMPARISON:  11/01/2020, 05/17/2019 FINDINGS: Stable cardiomediastinal contours. Chronic scarring within the left lung. No definite superimposed airspace opacity. No pleural effusion or pneumothorax. IMPRESSION: Chronic scarring within the left lung. No definite superimposed acute cardiopulmonary process. Electronically Signed   By: Duanne Guess D.O.   On: 02/06/2021 17:28      Scheduled Meds:  Darunavir-Cobicistat-Emtricitabine-Tenofovir Alafenamide  1 tablet Oral Q breakfast   dolutegravir  50 mg Oral Daily   enoxaparin (LOVENOX) injection  80 mg Subcutaneous Q12H   predniSONE  40 mg Oral BID WC   sulfamethoxazole-trimethoprim  1 tablet Oral Daily   Continuous Infusions:  cefTRIAXone (ROCEPHIN)  IV 2 g (02/08/21 0931)     LOS: 1 day      Time spent: 25 minutes   Noralee Stain, DO [facility] 02/08/2021, 11:05 AM   Available via Epic secure chat 7am-7pm After these hours, please refer to coverage provider listed on [email or web address]

## 2021-02-08 NOTE — Progress Notes (Signed)
Initial Nutrition Assessment  DOCUMENTATION CODES:   Not applicable  INTERVENTION:   Ensure Enlive po BID, each supplement provides 350 kcal and 20 grams of protein. MVI with minerals daily. Need to obtain current weight for accurate nutrition assessment.  NUTRITION DIAGNOSIS:   Increased nutrient needs related to chronic illness, acute illness (HIV; PNA) as evidenced by estimated needs.  GOAL:   Patient will meet greater than or equal to 90% of their needs  MONITOR:   PO intake, Supplement acceptance, Labs  REASON FOR ASSESSMENT:   Malnutrition Screening Tool    ASSESSMENT:   50 yo male admitted with acute respiratory failure, CAP. PMH includes HIV, TB, PJP, left lower lung PE, colostomy, advanced HPV genital warts.  Unable to speak with patient or complete NFPE at this time.   On admission nutrition screen, patient reported unsure if he has lost any weight.   Weight history reviewed. Current weight 81.6 kg likely carried over from previous admission with exact same weight. Need updated weight for accurate nutrition assessment.  Labs reviewed. Na 133 Medications reviewed and include Prednisone, IV Rocephin.  Currently on a regular diet. Meal intakes not recorded.   Patient with increased nutrient needs d/t hx of HIV and current PNA. Will add PO supplements to maximize oral intake.   Diet Order:   Diet Order             Diet regular Room service appropriate? Yes; Fluid consistency: Thin  Diet effective now                   EDUCATION NEEDS:   No education needs have been identified at this time  Skin:  Skin Assessment: Reviewed RN Assessment  Last BM:  11/26  Height:   Ht Readings from Last 1 Encounters:  02/06/21 6\' 1"  (1.854 m)    Weight:   Wt Readings from Last 1 Encounters:  02/06/21 81.6 kg    BMI:  Body mass index is 23.75 kg/m.  Estimated Nutritional Needs:   Kcal:  2300-2500  Protein:  110-120 gm  Fluid:  >/= 2.3  L    02/08/21, RD, LDN, CNSC Please refer to Amion for contact information.

## 2021-02-08 NOTE — Plan of Care (Signed)
°  Problem: Activity: °Goal: Ability to tolerate increased activity will improve °Outcome: Progressing °  °Problem: Respiratory: °Goal: Ability to maintain adequate ventilation will improve °Outcome: Progressing °Goal: Ability to maintain a clear airway will improve °Outcome: Progressing °  °

## 2021-02-08 NOTE — Progress Notes (Signed)
Subjective: No new complaints   Antibiotics:  Anti-infectives (From admission, onward)    Start     Dose/Rate Route Frequency Ordered Stop   02/08/21 1015  azithromycin (ZITHROMAX) tablet 500 mg  Status:  Discontinued        500 mg Oral Daily 02/08/21 0919 02/08/21 0920   02/07/21 1630  sulfamethoxazole-trimethoprim (BACTRIM DS) 800-160 MG per tablet 1 tablet        1 tablet Oral Daily 02/07/21 1445     02/07/21 1000  dolutegravir (TIVICAY) tablet 50 mg        50 mg Oral Daily 02/06/21 2321     02/07/21 0800  cefTRIAXone (ROCEPHIN) 2 g in sodium chloride 0.9 % 100 mL IVPB        2 g 200 mL/hr over 30 Minutes Intravenous Every 24 hours 02/06/21 2319 02/12/21 0759   02/07/21 0800  Darunavir-Cobicistat-Emtricitabine-Tenofovir Alafenamide (SYMTUZA) 800-150-200-10 MG TABS 1 tablet        1 tablet Oral Daily with breakfast 02/06/21 2321     02/07/21 0000  sulfamethoxazole-trimethoprim (BACTRIM) 480 mg in dextrose 5 % 500 mL IVPB  Status:  Discontinued        480 mg 353.3 mL/hr over 90 Minutes Intravenous Every 8 hours 02/06/21 2355 02/07/21 1445   02/06/21 2330  azithromycin (ZITHROMAX) 500 mg in sodium chloride 0.9 % 250 mL IVPB  Status:  Discontinued        500 mg 250 mL/hr over 60 Minutes Intravenous Daily at bedtime 02/06/21 2319 02/08/21 0919   02/06/21 2145  cefTRIAXone (ROCEPHIN) 1 g in sodium chloride 0.9 % 100 mL IVPB        1 g 200 mL/hr over 30 Minutes Intravenous  Once 02/06/21 2143 02/06/21 2250   02/06/21 2145  azithromycin (ZITHROMAX) 500 mg in sodium chloride 0.9 % 250 mL IVPB  Status:  Discontinued        500 mg 250 mL/hr over 60 Minutes Intravenous  Once 02/06/21 2143 02/06/21 2216       Medications: Scheduled Meds:  Darunavir-Cobicistat-Emtricitabine-Tenofovir Alafenamide  1 tablet Oral Q breakfast   dolutegravir  50 mg Oral Daily   enoxaparin (LOVENOX) injection  80 mg Subcutaneous Q12H   feeding supplement  237 mL Oral BID BM   multivitamin with  minerals  1 tablet Oral Daily   [START ON 02/09/2021] predniSONE  40 mg Oral Q breakfast   sulfamethoxazole-trimethoprim  1 tablet Oral Daily   Continuous Infusions:  cefTRIAXone (ROCEPHIN)  IV 2 g (02/08/21 0931)   PRN Meds:.acetaminophen **OR** acetaminophen, albuterol, diphenhydrAMINE, guaiFENesin, ondansetron **OR** ondansetron (ZOFRAN) IV, senna-docusate    Objective: Weight change:   Intake/Output Summary (Last 24 hours) at 02/08/2021 1549 Last data filed at 02/08/2021 1305 Gross per 24 hour  Intake 714.84 ml  Output 2200 ml  Net -1485.16 ml   Blood pressure 119/85, pulse 78, temperature 97.8 F (36.6 C), temperature source Oral, resp. rate 17, height 6\' 1"  (1.854 m), weight 81.6 kg, SpO2 93 %. Temp:  [97.8 F (36.6 C)-98.4 F (36.9 C)] 97.8 F (36.6 C) (11/26 0849) Pulse Rate:  [78-93] 78 (11/26 0849) Resp:  [17-24] 17 (11/26 0849) BP: (112-122)/(78-87) 119/85 (11/26 0849) SpO2:  [92 %-96 %] 93 % (11/26 0849)  Physical Exam: Physical Exam Constitutional:      Appearance: He is underweight.  Cardiovascular:     Rate and Rhythm: Tachycardia present.     Heart sounds: No murmur heard.   No  friction rub. No gallop.  Pulmonary:     Effort: No respiratory distress.     Breath sounds: Stridor present. Rhonchi present. No wheezing or rales.  Abdominal:     General: Bowel sounds are normal. There is distension.  Neurological:     General: No focal deficit present.     Mental Status: He is oriented to person, place, and time.  Psychiatric:        Mood and Affect: Mood normal.        Behavior: Behavior normal.        Thought Content: Thought content normal.        Judgment: Judgment normal.     CBC:    BMET Recent Labs    02/07/21 0357 02/08/21 0127  NA 135 133*  K 3.3* 4.1  CL 100 100  CO2 27 26  GLUCOSE 113* 129*  BUN 13 19  CREATININE 1.22 1.36*  CALCIUM 8.4* 8.9     Liver Panel  Recent Labs    02/06/21 1710  PROT 8.9*  ALBUMIN 2.8*   AST 19  ALT 11  ALKPHOS 59  BILITOT 1.1       Sedimentation Rate No results for input(s): ESRSEDRATE in the last 72 hours. C-Reactive Protein No results for input(s): CRP in the last 72 hours.  Micro Results: Recent Results (from the past 720 hour(s))  Resp Panel by RT-PCR (Flu A&B, Covid) Nasopharyngeal Swab     Status: None   Collection Time: 02/06/21  4:34 PM   Specimen: Nasopharyngeal Swab; Nasopharyngeal(NP) swabs in vial transport medium  Result Value Ref Range Status   SARS Coronavirus 2 by RT PCR NEGATIVE NEGATIVE Final    Comment: (NOTE) SARS-CoV-2 target nucleic acids are NOT DETECTED.  The SARS-CoV-2 RNA is generally detectable in upper respiratory specimens during the acute phase of infection. The lowest concentration of SARS-CoV-2 viral copies this assay can detect is 138 copies/mL. A negative result does not preclude SARS-Cov-2 infection and should not be used as the sole basis for treatment or other patient management decisions. A negative result may occur with  improper specimen collection/handling, submission of specimen other than nasopharyngeal swab, presence of viral mutation(s) within the areas targeted by this assay, and inadequate number of viral copies(<138 copies/mL). A negative result must be combined with clinical observations, patient history, and epidemiological information. The expected result is Negative.  Fact Sheet for Patients:  EntrepreneurPulse.com.au  Fact Sheet for Healthcare Providers:  IncredibleEmployment.be  This test is no t yet approved or cleared by the [place] FDA and  has been authorized for detection and/or diagnosis of SARS-CoV-2 by FDA under an Emergency Use Authorization (EUA). This EUA will remain  in effect (meaning this test can be used) for the duration of the COVID-19 declaration under Section 564(b)(1) of the Act, 21 U.S.C.section 360bbb-3(b)(1), unless the authorization  is terminated  or revoked sooner.       Influenza A by PCR NEGATIVE NEGATIVE Final   Influenza B by PCR NEGATIVE NEGATIVE Final    Comment: (NOTE) The Xpert Xpress SARS-CoV-2/FLU/RSV plus assay is intended as an aid in the diagnosis of influenza from Nasopharyngeal swab specimens and should not be used as a sole basis for treatment. Nasal washings and aspirates are unacceptable for Xpert Xpress SARS-CoV-2/FLU/RSV testing.  Fact Sheet for Patients: EntrepreneurPulse.com.au  Fact Sheet for Healthcare Providers: IncredibleEmployment.be  This test is not yet approved or cleared by the [place] FDA and has been authorized for detection and/or  diagnosis of SARS-CoV-2 by FDA under an Emergency Use Authorization (EUA). This EUA will remain in effect (meaning this test can be used) for the duration of the COVID-19 declaration under Section 564(b)(1) of the Act, 21 U.S.C. section 360bbb-3(b)(1), unless the authorization is terminated or revoked.  Performed at [facility], [place] [address] [place], [place] [address]   Culture, blood (routine x 2)     Status: None (Preliminary result)   Collection Time: 02/06/21  5:10 PM   Specimen: BLOOD  Result Value Ref Range Status   Specimen Description BLOOD SITE NOT SPECIFIED  Final   Special Requests   Final    BOTTLES DRAWN AEROBIC AND ANAEROBIC Blood Culture results may not be optimal due to an inadequate volume of blood received in culture bottles   Culture   Final    NO GROWTH 2 DAYS Performed at [facility], [place] [address] [place], [place] [address]    Report Status PENDING  Incomplete  Culture, blood (routine x 2)     Status: None (Preliminary result)   Collection Time: 02/06/21  5:20 PM   Specimen: BLOOD  Result Value Ref Range Status   Specimen Description BLOOD LEFT ANTECUBITAL  Final   Special Requests   Final    BOTTLES DRAWN AEROBIC AND ANAEROBIC Blood Culture adequate  volume   Culture   Final    NO GROWTH 2 DAYS Performed at [facility], [place] [address], [place], [place] [address]    Report Status PENDING  Incomplete    Studies/Results: DG Pelvis 1-2 Views  Result Date: 02/07/2021 CLINICAL DATA:  Fall. EXAM: PELVIS - 1-2 VIEW COMPARISON:  None. FINDINGS: There is no evidence of pelvic fracture or diastasis. No pelvic bone lesions are seen. IMPRESSION: Negative. Electronically Signed   By: Ronney Asters M.D.   On: 02/07/2021 15:23   CT Angio Chest PE W and/or Wo Contrast  Result Date: 02/06/2021 CLINICAL DATA:  Cough. Shortness of breath for 3 days. Decreased oxygen saturation. HIV/aids. EXAM: CT ANGIOGRAPHY CHEST WITH CONTRAST TECHNIQUE: Multidetector CT imaging of the chest was performed using the standard protocol during bolus administration of intravenous contrast. Multiplanar CT image reconstructions and MIPs were obtained to evaluate the vascular anatomy. CONTRAST:  26mL OMNIPAQUE IOHEXOL 350 MG/ML SOLN COMPARISON:  Plain film of earlier today.  CTA chest 11/01/2020 FINDINGS: Cardiovascular: The quality of this exam for evaluation of pulmonary embolism is moderate to good. No evidence of pulmonary embolism. Aortic atherosclerosis. Normal heart size, without pericardial effusion. Mediastinum/Nodes: Mediastinal nodes of up to 1.1 cm in the AP window are unchanged. Right hilar adenopathy at 1.9 cm on 45/5 is similar. Tiny hiatal hernia. Lungs/Pleura: No pleural fluid. Moderate centrilobular and paraseptal emphysema. Primarily similar appearance of chronic lung disease, as evidenced by areas of cystic bronchiectasis, peribronchovascular nodularity and airspace disease. Minimal right lower lobe dependent airspace disease medially on 70/7 is new. Anterior left upper lobe consolidation on the prior CT has improved. Upper Abdomen: Normal imaged portions of the liver, spleen, pancreas, adrenal glands, kidneys. Musculoskeletal: No acute osseous abnormality. Mild  convex right thoracic spine curvature. Review of the MIP images confirms the above findings. IMPRESSION: 1.  No evidence of pulmonary embolism. 2. Relatively similar appearance of peribronchovascular nodularity, airspace disease, bronchiectasis, and architectural distortion. Findings are most consistent with chronic atypical infection, including mycobacterial etiologies. When correlated with the electronic medical record, history includes HIV and tuberculosis. 3. Aortic atherosclerosis (ICD10-I70.0) and emphysema (ICD10-J43.9). 4.  Chronic thoracic adenopathy, favored to be reactive. 5. The narrowing of the lingular and left lower lobe bronchi is similar to on the prior exam, favored to be secondary to reactive nodes in this area. Electronically Signed   By: Abigail Miyamoto M.D.   On: 02/06/2021 21:07   US RENAL  Result Date: 02/07/2021 CLINICAL DATA:  Flank pain. EXAM: RENAL / URINARY TRACT ULTRASOUND COMPLETE COMPARISON:  11/01/2020. FINDINGS: Right Kidney: Renal measurements: 10.3 x 4.5 x 4.3 cm = volume: 103.81 mL. Echogenicity within normal limits. No mass or hydronephrosis visualized. Left Kidney: Renal measurements: 10.1 x 4.9 x 5.0 cm = volume: 128.65 mL. Echogenicity within normal limits. No mass or hydronephrosis visualized. Bladder: Appears normal for degree of bladder distention. Other: No abnormality is seen in the region of concern in the right lower quadrant. IMPRESSION: Normal exam. Electronically Signed   By: Brett Fairy M.D.   On: 02/07/2021 00:56   DG Chest Port 1 View  Result Date: 02/06/2021 CLINICAL DATA:  Shortness of breath EXAM: PORTABLE CHEST 1 VIEW COMPARISON:  11/01/2020, 05/17/2019 FINDINGS: Stable cardiomediastinal contours. Chronic scarring within the left lung. No definite superimposed airspace opacity. No pleural effusion or pneumothorax. IMPRESSION: Chronic scarring within the left lung. No definite superimposed acute cardiopulmonary process. Electronically Signed   By:  Davina Poke D.O.   On: 02/06/2021 17:28      Assessment/Plan:  INTERVAL HISTORY: Patient has improved since admission   Principal Problem:   Acute respiratory failure with hypoxia ([place]) Active Problems:   HIV (human immunodeficiency virus infection) ([place])   AKI (acute kidney injury) ([place])   History of pulmonary embolus (PE)   S/P colostomy ([place])   Acute hypoxemic respiratory failure ([name])    [name] is a 50 y.o. 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  He has CT scan suggestive of community-acquired pneumonia.  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  He needs to continue on TIVICAY and [place].  Note given he is taking Ensure we should make sure that when he takes the dolutegravir that he is eating with this or else spacing it apart from his Ensure to avoid cannulation of the integrase strand transfer inhibitor  He needs to be on Bactrim for PCP prevention he also claims to have not had these medications because there were no refills  He also needs to be on azithromycin 1200 mg once weekly once he finishes his treatment for pneumonia he also appeared to not be on this.  #4  Hip pain this is resolved and plain films of the hips are unremarkable  #5  Bronchiectasis  I discussed with him need to establish with a pulmonary critical care doctor for follow-up given his bronchiectasis and chronic respiratory problems.  He currently is on oxygen which he normally is not on at home and need for home oxygen versus resolution of  his oxygen requirements is critical to.  I would really prefer if you were in the hospital until we can make sure that he has been supplied with a 30-day supply of  all of his medications.  Also arrange for follow-up in our clinic.  I spent 42 minutes with the patient including face to face counseling of the patient guarding his multidrug-resistant HIV his antiretroviral regimen his regimens to prevent opportunistic infections his DVT PE bronchiectasis personally reviewing his CT of the chest chest x-ray CBC CMP LDH along with review of medical records before and during the visit and in coordination of his care.    LOS: 1 day   Acey Lav 02/08/2021, 3:49 PM

## 2021-02-09 DIAGNOSIS — J189 Pneumonia, unspecified organism: Secondary | ICD-10-CM | POA: Diagnosis not present

## 2021-02-09 DIAGNOSIS — B20 Human immunodeficiency virus [HIV] disease: Secondary | ICD-10-CM | POA: Diagnosis not present

## 2021-02-09 DIAGNOSIS — N179 Acute kidney failure, unspecified: Secondary | ICD-10-CM

## 2021-02-09 DIAGNOSIS — J9601 Acute respiratory failure with hypoxia: Secondary | ICD-10-CM | POA: Diagnosis not present

## 2021-02-09 LAB — BASIC METABOLIC PANEL
Anion gap: 6 (ref 5–15)
BUN: 35 mg/dL — ABNORMAL HIGH (ref 6–20)
CO2: 25 mmol/L (ref 22–32)
Calcium: 9.4 mg/dL (ref 8.9–10.3)
Chloride: 99 mmol/L (ref 98–111)
Creatinine, Ser: 1.36 mg/dL — ABNORMAL HIGH (ref 0.61–1.24)
GFR, Estimated: >60 mL/min (ref >60)
Glucose, Bld: 113 mg/dL — ABNORMAL HIGH (ref 70–99)
Potassium: 4.1 mmol/L (ref 3.5–5.1)
Sodium: 130 mmol/L — ABNORMAL LOW (ref 135–145)

## 2021-02-09 LAB — HIV-1 RNA QUANT-NO REFLEX-BLD
HIV 1 RNA Quant: 50 copies/mL
LOG10 HIV-1 RNA: 1.699 log10copy/mL

## 2021-02-09 MED ORDER — AZITHROMYCIN 500 MG PO TABS
500.0000 mg | ORAL_TABLET | Freq: Every day | ORAL | Status: DC
  Administered 2021-02-09 – 2021-02-10 (×2): 500 mg via ORAL
  Filled 2021-02-09 (×3): qty 1

## 2021-02-09 MED ORDER — ADULT MULTIVITAMIN W/MINERALS CH
1.0000 | ORAL_TABLET | Freq: Every day | ORAL | Status: DC
  Administered 2021-02-09: 22:00:00 1 via ORAL
  Filled 2021-02-09: qty 1

## 2021-02-09 NOTE — Progress Notes (Signed)
Pharmacy Antibiotic and Anticoagulation Note  [name] is a 50 y.o. 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 Pt also to continue Lovenox for h/o PE/DVT in August 2022; of note pt reported to pharmacy tech that his Lovenox regimen has been completed, but there have been no outpt notes that suggest this (VTE treatment is usually at least 58mo.  Antibiotic Plan:  CONTINUE azithromycin 500 mg PO daily  CONTINUE ceftriaxone 2g IV Q24H  CONTINUE Bactrim DS daily for PCP ppx  F/U restart azithromycin 1,200 mg weekly for MAC ppx following completion of CAP tx  F/U CD4/HIV RNA   Anticoagulation Plan:  Lovenox 833mSQ Q12H  Monitor CBC, signs/symptoms of bleeding  F/U long-term AC plan   Height: _0  (185.4 cm) Weight: 81.6 kg (180 lb) (from Aug 2022 records) IBW/kg (Calculated) : 79.9  Temp (24hrs), Avg:97.9 F (36.6 C), Min:97.6 F (36.4 C), Max:98.2 F (36.8 C)  Recent Labs  Lab 02/06/21 1633 02/06/21 1710 02/07/21 0357 02/08/21 0127 02/09/21 0054  WBC  --  8.0 7.5 7.7  --   CREATININE  --  1.67* 1.22 1.36* 1.36*  LATICACIDVEN 0.8  --  0.8  --   --     Estimated Creatinine Clearance: 74.3 mL/min (A) (by C-G formula based on SCr of 1.36 mg/dL (H)).    No Known Allergies  Antimicrobials this admission: IV Bactrim 11/24>>11/25  CTX 11/25>>(11/29)  Azithro 11/24>>(11/29)  - missed dose of azithro 11/26   Microbiology results: 11/24 BCx: pend Legionella/strep: pend  AuAdria DillPharmD PGY-1 Acute Care Resident  02/09/2021 8:05 AM

## 2021-02-09 NOTE — Progress Notes (Signed)
PROGRESS NOTE        PATIENT DETAILS Name: [name] Age: 50 y.o. Sex: male Date of Birth: [birth year] Admit Date: 02/06/2021 Admitting Physician Noralee Stain, DO PCP:Pcp, No  Brief Narrative: Patient is a 50 y.o. male with history of HIV, tuberculosis, PJP, PE, history of advanced HPV genital warts-s/p diverting colostomy-who presented with shortness of breath-found to have acute hypoxic respiratory failure due to PNA  Subjective: Lying comfortably in bed-denies any chest pain or shortness of breath.  Feels better-continues to cough.  Objective: Vitals: Blood pressure 116/79, pulse 88, temperature 98.2 F (36.8 C), temperature source Oral, resp. rate 16, height 6\' 1"  (1.854 m), weight 81.6 kg, SpO2 94 %.   Exam: Gen Exam:Alert awake-not in any distress HEENT:atraumatic, normocephalic Chest: B/L clear to auscultation anteriorly CVS:S1S2 regular Abdomen:soft non tender, non distended.  Ostomy in place-brown stools. Extremities:no edema Neurology: Non focal Skin: no rash  Pertinent Labs/Radiology: Recent Labs  Lab 02/06/21 1710 02/07/21 0357 02/08/21 0127 02/09/21 0054  WBC 8.0   < > 7.7  --   HGB 13.7   < > 12.5*  --   PLT 180   < > 162  --   NA 137   < > 133* 130*  K 3.6   < > 4.1 4.1  CREATININE 1.67*   < > 1.36* 1.36*  AST 19  --   --   --   ALT 11  --   --   --   ALKPHOS 59  --   --   --   BILITOT 1.1  --   --   --    < > = values in this interval not displayed.    Assessment/Plan: Acute hypoxic respiratory failure due to community-acquired pneumonia: Continue antimicrobial therapy with Rocephin/Zithromax-felt to be unlikely to have PJP at this point-can stop steroids and change Bactrim to prophylactic dosing.  Blood cultures negative so far.  History of bronchiectasis: will need to establish with PCCM as an outpatient.  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  PE/DVT: Continue Lovenox-he does not want to try oral  alternatives.  AKI: Creatinine continues to be mildly elevated-probably will improve once Bactrim dosage has been adjusted.  History of anal/genital condyloma-s/p diverting colostomy: Continue ostomy care  Nutrition Status: Nutrition Problem: Increased nutrient needs Etiology: chronic illness, acute illness (HIV; PNA) Signs/Symptoms: estimated needs Interventions: Ensure Enlive (each supplement provides 350kcal and 20 grams of protein), MVI  BMI Estimated body mass index is 23.75 kg/m as calculated from the following:   Height as of this encounter: 6\' 1"  (1.854 m).   Weight as of this encounter: 81.6 kg.   Procedures: None Consults: ID DVT Prophylaxis: Lovenox Code Status:Full code  Family Communication: None at bedside  Time spent: 25- minutes-Greater than 50% of this time was spent in counseling, explanation of diagnosis, planning of further management, and coordination of care.   Disposition Plan: Status is: Inpatient  Remains inpatient appropriate because: PNA with hypoxia-on IV antibiotics.    Diet: Diet Order             Diet regular Room service appropriate? Yes; Fluid consistency: Thin  Diet effective now                     Antimicrobial agents: Anti-infectives (From admission, onward)    Start  Dose/Rate Route Frequency Ordered Stop   02/09/21 1000  azithromycin (ZITHROMAX) tablet 500 mg        500 mg Oral Daily 02/09/21 0753 02/12/21 0959   02/08/21 1015  azithromycin (ZITHROMAX) tablet 500 mg  Status:  Discontinued        500 mg Oral Daily 02/08/21 0919 02/08/21 0920   02/07/21 1630  sulfamethoxazole-trimethoprim (BACTRIM DS) 800-160 MG per tablet 1 tablet        1 tablet Oral Daily 02/07/21 1445     02/07/21 1000  dolutegravir (TIVICAY) tablet 50 mg        50 mg Oral Daily 02/06/21 2321     02/07/21 0800  cefTRIAXone (ROCEPHIN) 2 g in sodium chloride 0.9 % 100 mL IVPB        2 g 200 mL/hr over 30 Minutes Intravenous Every 24 hours  02/06/21 2319 02/12/21 0759   02/07/21 0800  Darunavir-Cobicistat-Emtricitabine-Tenofovir Alafenamide (SYMTUZA) 800-150-200-10 MG TABS 1 tablet        1 tablet Oral Daily with breakfast 02/06/21 2321     02/07/21 0000  sulfamethoxazole-trimethoprim (BACTRIM) 480 mg in dextrose 5 % 500 mL IVPB  Status:  Discontinued        480 mg 353.3 mL/hr over 90 Minutes Intravenous Every 8 hours 02/06/21 2355 02/07/21 1445   02/06/21 2330  azithromycin (ZITHROMAX) 500 mg in sodium chloride 0.9 % 250 mL IVPB  Status:  Discontinued        500 mg 250 mL/hr over 60 Minutes Intravenous Daily at bedtime 02/06/21 2319 02/08/21 0919   02/06/21 2145  cefTRIAXone (ROCEPHIN) 1 g in sodium chloride 0.9 % 100 mL IVPB        1 g 200 mL/hr over 30 Minutes Intravenous  Once 02/06/21 2143 02/06/21 2250   02/06/21 2145  azithromycin (ZITHROMAX) 500 mg in sodium chloride 0.9 % 250 mL IVPB  Status:  Discontinued        500 mg 250 mL/hr over 60 Minutes Intravenous  Once 02/06/21 2143 02/06/21 2216        MEDICATIONS: Scheduled Meds:  azithromycin  500 mg Oral Daily   Darunavir-Cobicistat-Emtricitabine-Tenofovir Alafenamide  1 tablet Oral Q breakfast   dolutegravir  50 mg Oral Daily   enoxaparin (LOVENOX) injection  80 mg Subcutaneous Q12H   feeding supplement  237 mL Oral BID BM   multivitamin with minerals  1 tablet Oral QHS   predniSONE  40 mg Oral Q breakfast   sulfamethoxazole-trimethoprim  1 tablet Oral Daily   Continuous Infusions:  cefTRIAXone (ROCEPHIN)  IV 2 g (02/09/21 0947)   PRN Meds:.acetaminophen **OR** acetaminophen, albuterol, diphenhydrAMINE, guaiFENesin, ondansetron **OR** ondansetron (ZOFRAN) IV, senna-docusate   I have personally reviewed following labs and imaging studies  LABORATORY DATA: CBC: Recent Labs  Lab 02/06/21 1710 02/07/21 0357 02/08/21 0127  WBC 8.0 7.5 7.7  NEUTROABS 5.3  --   --   HGB 13.7 12.7* 12.5*  HCT 41.5 38.4* 38.3*  MCV 101.2* 102.1* 101.1*  PLT 180 134* 162     Basic Metabolic Panel: Recent Labs  Lab 02/06/21 1710 02/07/21 0357 02/08/21 0127 02/09/21 0054  NA 137 135 133* 130*  K 3.6 3.3* 4.1 4.1  CL 98 100 100 99  CO2 28 27 26 25   GLUCOSE 107* 113* 129* 113*  BUN 20 13 19  35*  CREATININE 1.67* 1.22 1.36* 1.36*  CALCIUM 9.0 8.4* 8.9 9.4  MG  --   --  1.9  --     GFR:  Estimated Creatinine Clearance: 74.3 mL/min (A) (by C-G formula based on SCr of 1.36 mg/dL (H)).  Liver Function Tests: Recent Labs  Lab 02/06/21 1710  AST 19  ALT 11  ALKPHOS 59  BILITOT 1.1  PROT 8.9*  ALBUMIN 2.8*   No results for input(s): LIPASE, AMYLASE in the last 168 hours. No results for input(s): AMMONIA in the last 168 hours.  Coagulation Profile: No results for input(s): INR, PROTIME in the last 168 hours.  Cardiac Enzymes: No results for input(s): CKTOTAL, CKMB, CKMBINDEX, TROPONINI in the last 168 hours.  BNP (last 3 results) No results for input(s): PROBNP in the last 8760 hours.  Lipid Profile: No results for input(s): CHOL, HDL, LDLCALC, TRIG, CHOLHDL, LDLDIRECT in the last 72 hours.  Thyroid Function Tests: No results for input(s): TSH, T4TOTAL, FREET4, T3FREE, THYROIDAB in the last 72 hours.  Anemia Panel: No results for input(s): VITAMINB12, FOLATE, FERRITIN, TIBC, IRON, RETICCTPCT in the last 72 hours.  Urine analysis:    Component Value Date/Time   COLORURINE YELLOW 11/01/2020 1848   APPEARANCEUR HAZY (A) 11/01/2020 1848   LABSPEC 1.039 (H) 11/01/2020 1848   PHURINE 6.0 11/01/2020 1848   GLUCOSEU NEGATIVE 11/01/2020 1848   HGBUR SMALL (A) 11/01/2020 1848   BILIRUBINUR NEGATIVE 11/01/2020 1848   KETONESUR NEGATIVE 11/01/2020 1848   PROTEINUR 30 (A) 11/01/2020 1848   UROBILINOGEN 1.0 03/23/2014 1400   NITRITE NEGATIVE 11/01/2020 1848   LEUKOCYTESUR NEGATIVE 11/01/2020 1848    Sepsis Labs: Lactic Acid, Venous    Component Value Date/Time   LATICACIDVEN 0.8 02/07/2021 0357    MICROBIOLOGY: Recent Results (from  the past 240 hour(s))  Resp Panel by RT-PCR (Flu A&B, Covid) Nasopharyngeal Swab     Status: None   Collection Time: 02/06/21  4:34 PM   Specimen: Nasopharyngeal Swab; Nasopharyngeal(NP) swabs in vial transport medium  Result Value Ref Range Status   SARS Coronavirus 2 by RT PCR NEGATIVE NEGATIVE Final    Comment: (NOTE) SARS-CoV-2 target nucleic acids are NOT DETECTED.  The SARS-CoV-2 RNA is generally detectable in upper respiratory specimens during the acute phase of infection. The lowest concentration of SARS-CoV-2 viral copies this assay can detect is 138 copies/mL. A negative result does not preclude SARS-Cov-2 infection and should not be used as the sole basis for treatment or other patient management decisions. A negative result may occur with  improper specimen collection/handling, submission of specimen other than nasopharyngeal swab, presence of viral mutation(s) within the areas targeted by this assay, and inadequate number of viral copies(<138 copies/mL). A negative result must be combined with clinical observations, patient history, and epidemiological information. The expected result is Negative.  Fact Sheet for Patients:  BloggerCourse.com  Fact Sheet for Healthcare Providers:  SeriousBroker.it  This test is no t yet approved or cleared by the [place] FDA and  has been authorized for detection and/or diagnosis of SARS-CoV-2 by FDA under an Emergency Use Authorization (EUA). This EUA will remain  in effect (meaning this test can be used) for the duration of the COVID-19 declaration under Section 564(b)(1) of the Act, 21 U.S.C.section 360bbb-3(b)(1), unless the authorization is terminated  or revoked sooner.       Influenza A by PCR NEGATIVE NEGATIVE Final   Influenza B by PCR NEGATIVE NEGATIVE Final    Comment: (NOTE) The Xpert Xpress SARS-CoV-2/FLU/RSV plus assay is intended as an aid in the diagnosis of  influenza from Nasopharyngeal swab specimens and should not be used as a sole basis  for treatment. Nasal washings and aspirates are unacceptable for Xpert Xpress SARS-CoV-2/FLU/RSV testing.  Fact Sheet for Patients: BloggerCourse.com  Fact Sheet for Healthcare Providers: SeriousBroker.it  This test is not yet approved or cleared by the [place] FDA and has been authorized for detection and/or diagnosis of SARS-CoV-2 by FDA under an Emergency Use Authorization (EUA). This EUA will remain in effect (meaning this test can be used) for the duration of the COVID-19 declaration under Section 564(b)(1) of the Act, 21 U.S.C. section 360bbb-3(b)(1), unless the authorization is terminated or revoked.  Performed at [facility], [address], [place], [place] [address]   Culture, blood (routine x 2)     Status: None (Preliminary result)   Collection Time: 02/06/21  5:10 PM   Specimen: BLOOD  Result Value Ref Range Status   Specimen Description BLOOD SITE NOT SPECIFIED  Final   Special Requests   Final    BOTTLES DRAWN AEROBIC AND ANAEROBIC Blood Culture results may not be optimal due to an inadequate volume of blood received in culture bottles   Culture   Final    NO GROWTH 3 DAYS Performed at [facility], [address], [place], [place] [address]    Report Status PENDING  Incomplete  Culture, blood (routine x 2)     Status: None (Preliminary result)   Collection Time: 02/06/21  5:20 PM   Specimen: BLOOD  Result Value Ref Range Status   Specimen Description BLOOD LEFT ANTECUBITAL  Final   Special Requests   Final    BOTTLES DRAWN AEROBIC AND ANAEROBIC Blood Culture adequate volume   Culture   Final    NO GROWTH 3 DAYS Performed at [facility], [address], [place], [place] [address]    Report Status PENDING  Incomplete    RADIOLOGY STUDIES/RESULTS: DG Pelvis 1-2 Views  Result Date:  02/07/2021 CLINICAL DATA:  Fall. EXAM: PELVIS - 1-2 VIEW COMPARISON:  None. FINDINGS: There is no evidence of pelvic fracture or diastasis. No pelvic bone lesions are seen. IMPRESSION: Negative. Electronically Signed   By: Darliss Cheney M.D.   On: 02/07/2021 15:23     LOS: 2 days   Jeoffrey Massed, MD  [facility]    To contact the attending provider between 7A-7P or the covering provider during after hours 7P-7A, please log into the web site [email or web address] and access using universal [place] password for that web site. If you do not have the password, please call the hospital operator.  02/09/2021, 10:21 AM

## 2021-02-10 ENCOUNTER — Other Ambulatory Visit (HOSPITAL_COMMUNITY): Payer: Self-pay

## 2021-02-10 DIAGNOSIS — J189 Pneumonia, unspecified organism: Secondary | ICD-10-CM | POA: Diagnosis not present

## 2021-02-10 DIAGNOSIS — Z794 Long term (current) use of insulin: Secondary | ICD-10-CM

## 2021-02-10 DIAGNOSIS — R109 Unspecified abdominal pain: Secondary | ICD-10-CM | POA: Diagnosis not present

## 2021-02-10 DIAGNOSIS — E1100 Type 2 diabetes mellitus with hyperosmolarity without nonketotic hyperglycemic-hyperosmolar coma (NKHHC): Secondary | ICD-10-CM

## 2021-02-10 DIAGNOSIS — J471 Bronchiectasis with (acute) exacerbation: Secondary | ICD-10-CM | POA: Diagnosis not present

## 2021-02-10 DIAGNOSIS — N179 Acute kidney failure, unspecified: Secondary | ICD-10-CM | POA: Diagnosis not present

## 2021-02-10 DIAGNOSIS — J9601 Acute respiratory failure with hypoxia: Secondary | ICD-10-CM | POA: Diagnosis not present

## 2021-02-10 DIAGNOSIS — I2699 Other pulmonary embolism without acute cor pulmonale: Secondary | ICD-10-CM | POA: Diagnosis not present

## 2021-02-10 DIAGNOSIS — I82419 Acute embolism and thrombosis of unspecified femoral vein: Secondary | ICD-10-CM

## 2021-02-10 LAB — CD4/CD8 (T-HELPER/T-SUPPRESSOR CELL)
CD4 absolute: 269 /uL — ABNORMAL LOW (ref 400–1790)
CD4%: 14.76 % — ABNORMAL LOW (ref 33–65)
CD8 T Cell Abs: 1099 /uL — ABNORMAL HIGH (ref 190–1000)
CD8tox: 60.26 % — ABNORMAL HIGH (ref 12–40)
Ratio: 0.24 — ABNORMAL LOW (ref 1.0–3.0)
Total lymphocyte count: 1824 /uL (ref 1000–4000)

## 2021-02-10 MED ORDER — GUAIFENESIN ER 600 MG PO TB12
600.0000 mg | ORAL_TABLET | Freq: Two times a day (BID) | ORAL | 0 refills | Status: DC
  Filled 2021-02-10: qty 30, 15d supply, fill #0

## 2021-02-10 MED ORDER — SULFAMETHOXAZOLE-TRIMETHOPRIM 800-160 MG PO TABS
1.0000 | ORAL_TABLET | Freq: Every day | ORAL | 3 refills | Status: DC
  Filled 2021-02-10: qty 30, 30d supply, fill #0

## 2021-02-10 MED ORDER — BENZONATATE 200 MG PO CAPS
200.0000 mg | ORAL_CAPSULE | Freq: Three times a day (TID) | ORAL | 0 refills | Status: DC | PRN
Start: 2021-02-10 — End: 2021-04-24
  Filled 2021-02-10: qty 20, 7d supply, fill #0

## 2021-02-10 MED ORDER — ENOXAPARIN SODIUM 120 MG/0.8ML IJ SOSY
120.0000 mg | PREFILLED_SYRINGE | INTRAMUSCULAR | 3 refills | Status: DC
  Filled 2021-02-10: qty 24, 30d supply, fill #0

## 2021-02-10 MED ORDER — ENSURE ENLIVE PO LIQD
237.0000 mL | Freq: Two times a day (BID) | ORAL | 0 refills | Status: AC
Start: 2021-02-10 — End: 2021-03-12
  Filled 2021-02-10: qty 14220, 30d supply, fill #0

## 2021-02-10 MED ORDER — SITAGLIPTIN PHOS-METFORMIN HCL 50-500 MG PO TABS
1.0000 | ORAL_TABLET | Freq: Two times a day (BID) | ORAL | 3 refills | Status: DC
  Filled 2021-02-10: qty 60, 30d supply, fill #0

## 2021-02-10 MED ORDER — METFORMIN HCL 500 MG PO TABS
500.0000 mg | ORAL_TABLET | Freq: Two times a day (BID) | ORAL | 3 refills | Status: DC
  Filled 2021-02-10: qty 60, 30d supply, fill #0

## 2021-02-10 MED ORDER — AZITHROMYCIN 600 MG PO TABS
1200.0000 mg | ORAL_TABLET | ORAL | 2 refills | Status: DC
  Filled 2021-02-10: qty 8, 28d supply, fill #0

## 2021-02-10 MED ORDER — GUAIFENESIN ER 600 MG PO TB12
600.0000 mg | ORAL_TABLET | Freq: Two times a day (BID) | ORAL | Status: DC
  Administered 2021-02-10: 10:00:00 600 mg via ORAL
  Filled 2021-02-10: qty 1

## 2021-02-10 MED ORDER — ALBUTEROL SULFATE HFA 108 (90 BASE) MCG/ACT IN AERS
2.0000 | INHALATION_SPRAY | Freq: Four times a day (QID) | RESPIRATORY_TRACT | 2 refills | Status: DC | PRN
  Filled 2021-02-10: qty 18, 25d supply, fill #0

## 2021-02-10 MED ORDER — AZITHROMYCIN 600 MG PO TABS
1200.0000 mg | ORAL_TABLET | ORAL | Status: DC
  Filled 2021-02-10: qty 2

## 2021-02-10 MED ORDER — TIVICAY 50 MG PO TABS
50.0000 mg | ORAL_TABLET | Freq: Every day | ORAL | 5 refills | Status: DC
  Filled 2021-02-10: qty 30, 30d supply, fill #0

## 2021-02-10 MED ORDER — BENZONATATE 100 MG PO CAPS
200.0000 mg | ORAL_CAPSULE | Freq: Three times a day (TID) | ORAL | Status: DC
  Administered 2021-02-10: 10:00:00 200 mg via ORAL
  Filled 2021-02-10: qty 2

## 2021-02-10 MED ORDER — SYMTUZA 800-150-200-10 MG PO TABS
1.0000 | ORAL_TABLET | Freq: Every day | ORAL | 5 refills | Status: DC
  Filled 2021-02-10: qty 30, 30d supply, fill #0

## 2021-02-10 NOTE — Plan of Care (Signed)
°  Problem: Activity: °Goal: Ability to tolerate increased activity will improve °Outcome: Progressing °  °Problem: Respiratory: °Goal: Ability to maintain adequate ventilation will improve °Outcome: Progressing °Goal: Ability to maintain a clear airway will improve °Outcome: Progressing °  °

## 2021-02-10 NOTE — Discharge Summary (Addendum)
PATIENT DETAILS Name: [name] Age: 50 y.o. Sex: male Date of Birth: 09-12-1970 MRN: [id number]. Admitting Physician: Noralee Stain, DO PCP:Pcp, No  Admit Date: 02/06/2021 Discharge date: 02/10/2021  Recommendations for Outpatient Follow-up:  Follow up with PCP in 1-2 weeks Please obtain CMP/CBC in one week Please ensure follow-up at the infectious disease clinic.  Admitted From:  Home  Disposition: Home   Home Health: No  Equipment/Devices: None  Discharge Condition: Stable  CODE STATUS: FULL CODE  Diet recommendation:  Diet Order             Diet - low sodium heart healthy           Diet Carb Modified           Diet regular Room service appropriate? Yes; Fluid consistency: Thin  Diet effective now                    Brief Summary: Patient is a 50 y.o. male with history of HIV, tuberculosis, PJP, PE, history of advanced HPV genital warts-s/p diverting colostomy-who presented with shortness of breath-found to have acute hypoxic respiratory failure due to PNA  Brief Hospital Course: Acute hypoxic respiratory failure due to community-acquired pneumonia: Clinically improved-treated with Rocephin/Zithromax-not felt to have PJP pneumonitis.  Evaluated by ID-recommendations are to complete a course of antibiotics for community-acquired pneumonia.  Blood cultures negative so far.    History of bronchiectasis: will need to establish with PCCM as an outpatient.  Has chronic cough-likely attributable to bronchiectasis.   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  ID will arrange follow-up with their clinic.  He has been noncompliant to medications and to follow-up.   PE/DVT: Continue Lovenox-he does not want to try oral alternatives.   AKI: Creatinine continues to be mildly elevated-probably will improve once Bactrim dosage has been adjusted.   History of anal/genital condyloma-s/p diverting colostomy: Continue ostomy care   Nutrition  Status: Nutrition Problem: Increased nutrient needs Etiology: chronic illness, acute illness (HIV; PNA) Signs/Symptoms: estimated needs Interventions: Ensure Enlive (each supplement provides 350kcal and 20 grams of protein), MVI   BMI Estimated body mass index is 23.75 kg/m as calculated from the following:   Height as of this encounter: 6\' 1"  (1.854 m).   Weight as of this encounter: 81.6 kg.    Procedures None  Discharge Diagnoses:  Principal Problem:   CAP (community acquired pneumonia) Active Problems:   HIV (human immunodeficiency virus infection) ([name])   AKI (acute kidney injury) ([name])   Acute respiratory failure with hypoxia ([name])   History of pulmonary embolus (PE)   S/P colostomy ([name])   Acute hypoxemic respiratory failure ([name])   Flank pain   Hip pain   Bronchiectasis with acute exacerbation [facility])   Discharge Instructions:  Activity:  As tolerated    Discharge Instructions     Call MD for:  difficulty breathing, headache or visual disturbances   Complete by: As directed    Diet - low sodium heart healthy   Complete by: As directed    Diet Carb Modified   Complete by: As directed    Discharge instructions   Complete by: As directed    Follow with Primary MD in 1-2 weeks  Follow-up with infectious disease clinic.  Please get a complete blood count and chemistry panel checked by your Primary MD at your next visit, and again as instructed by your Primary MD.  Get Medicines reviewed and adjusted: Please take all your medications  with you for your next visit with your Primary MD  Laboratory/radiological data: Please request your Primary MD to go over all hospital tests and procedure/radiological results at the follow up, please ask your Primary MD to get all Hospital records sent to his/her office.  In some cases, they will be blood work, cultures and biopsy results pending at the time of your discharge. Please request that your primary care M.D. follows  up on these results.  Also Note the following: If you experience worsening of your admission symptoms, develop shortness of breath, life threatening emergency, suicidal or homicidal thoughts you must seek medical attention immediately by calling 911 or calling your MD immediately  if symptoms less severe.  You must read complete instructions/literature along with all the possible adverse reactions/side effects for all the Medicines you take and that have been prescribed to you. Take any new Medicines after you have completely understood and accpet all the possible adverse reactions/side effects.   Do not drive when taking Pain medications or sleeping medications (Benzodaizepines)  Do not take more than prescribed Pain, Sleep and Anxiety Medications. It is not advisable to combine anxiety,sleep and pain medications without talking with your primary care practitioner  Special Instructions: If you have smoked or chewed Tobacco  in the last 2 yrs please stop smoking, stop any regular Alcohol  and or any Recreational drug use.  Wear Seat belts while driving.  Please note: You were cared for by a hospitalist during your hospital stay. Once you are discharged, your primary care physician will handle any further medical issues. Please note that NO REFILLS for any discharge medications will be authorized once you are discharged, as it is imperative that you return to your primary care physician (or establish a relationship with a primary care physician if you do not have one) for your post hospital discharge needs so that they can reassess your need for medications and monitor your lab values.   Increase activity slowly   Complete by: As directed       Allergies as of 02/10/2021   No Known Allergies      Medication List     STOP taking these medications    sitaGLIPtin-metformin 50-500 MG tablet Commonly known as: JANUMET       TAKE these medications    albuterol 108 (90 Base) MCG/ACT  inhaler Commonly known as: VENTOLIN HFA Inhale 2 puffs into the lungs every 6 (six) hours as needed for wheezing or shortness of breath.   azithromycin 600 MG tablet Commonly known as: ZITHROMAX Take 2 tablets (1,200 mg total) by mouth every 7 (seven) days.   benzonatate 200 MG capsule Commonly known as: TESSALON Take 1 capsule (200 mg total) by mouth 3 (three) times daily as needed for cough.   enoxaparin 120 MG/0.8ML injection Commonly known as: LOVENOX Inject 1 syringe (120 mg total) into the skin daily.   feeding supplement Liqd Take 237 mLs by mouth 2 (two) times daily between meals.   metFORMIN 500 MG tablet Commonly known as: Glucophage Take 1 tablet (500 mg total) by mouth 2 (two) times daily with a meal.   SM Mucus Relief 600 MG 12 hr tablet Generic drug: guaiFENesin Take 1 tablet (600 mg total) by mouth 2 (two) times daily.   sulfamethoxazole-trimethoprim 800-160 MG tablet Commonly known as: BACTRIM DS Take 1 tablet by mouth daily.   Symtuza 800-150-200-10 MG Tabs Generic drug: Darunavir-Cobicistat-Emtricitabine-Tenofovir Alafenamide Take 1 tablet by mouth daily with breakfast.   Tivicay  50 MG tablet Generic drug: dolutegravir Take 1 tablet (50 mg total) by mouth daily.         Follow-up Information     Primary care MD. Schedule an appointment as soon as possible for a visit in 1 week(s).                 No Known Allergies    Consultations:  ID    Other Procedures/Studies: DG Pelvis 1-2 Views  Result Date: 02/07/2021 CLINICAL DATA:  Fall. EXAM: PELVIS - 1-2 VIEW COMPARISON:  None. FINDINGS: There is no evidence of pelvic fracture or diastasis. No pelvic bone lesions are seen. IMPRESSION: Negative. Electronically Signed   By: Darliss Cheney M.D.   On: 02/07/2021 15:23   CT Angio Chest PE W and/or Wo Contrast  Result Date: 02/06/2021 CLINICAL DATA:  Cough. Shortness of breath for 3 days. Decreased oxygen saturation. HIV/aids. EXAM: CT  ANGIOGRAPHY CHEST WITH CONTRAST TECHNIQUE: Multidetector CT imaging of the chest was performed using the standard protocol during bolus administration of intravenous contrast. Multiplanar CT image reconstructions and MIPs were obtained to evaluate the vascular anatomy. CONTRAST:  77mL OMNIPAQUE IOHEXOL 350 MG/ML SOLN COMPARISON:  Plain film of earlier today.  CTA chest 11/01/2020 FINDINGS: Cardiovascular: The quality of this exam for evaluation of pulmonary embolism is moderate to good. No evidence of pulmonary embolism. Aortic atherosclerosis. Normal heart size, without pericardial effusion. Mediastinum/Nodes: Mediastinal nodes of up to 1.1 cm in the AP window are unchanged. Right hilar adenopathy at 1.9 cm on 45/5 is similar. Tiny hiatal hernia. Lungs/Pleura: No pleural fluid. Moderate centrilobular and paraseptal emphysema. Primarily similar appearance of chronic lung disease, as evidenced by areas of cystic bronchiectasis, peribronchovascular nodularity and airspace disease. Minimal right lower lobe dependent airspace disease medially on 70/7 is new. Anterior left upper lobe consolidation on the prior CT has improved. Upper Abdomen: Normal imaged portions of the liver, spleen, pancreas, adrenal glands, kidneys. Musculoskeletal: No acute osseous abnormality. Mild convex right thoracic spine curvature. Review of the MIP images confirms the above findings. IMPRESSION: 1.  No evidence of pulmonary embolism. 2. Relatively similar appearance of peribronchovascular nodularity, airspace disease, bronchiectasis, and architectural distortion. Findings are most consistent with chronic atypical infection, including mycobacterial etiologies. When correlated with the electronic medical record, history includes HIV and tuberculosis. 3. Aortic atherosclerosis (ICD10-I70.0) and emphysema (ICD10-J43.9). 4. Chronic thoracic adenopathy, favored to be reactive. 5. The narrowing of the lingular and left lower lobe bronchi is similar  to on the prior exam, favored to be secondary to reactive nodes in this area. Electronically Signed   By: Jeronimo Greaves M.D.   On: 02/06/2021 21:07   US RENAL  Result Date: 02/07/2021 CLINICAL DATA:  Flank pain. EXAM: RENAL / URINARY TRACT ULTRASOUND COMPLETE COMPARISON:  11/01/2020. FINDINGS: Right Kidney: Renal measurements: 10.3 x 4.5 x 4.3 cm = volume: 103.81 mL. Echogenicity within normal limits. No mass or hydronephrosis visualized. Left Kidney: Renal measurements: 10.1 x 4.9 x 5.0 cm = volume: 128.65 mL. Echogenicity within normal limits. No mass or hydronephrosis visualized. Bladder: Appears normal for degree of bladder distention. Other: No abnormality is seen in the region of concern in the right lower quadrant. IMPRESSION: Normal exam. Electronically Signed   By: Thornell Sartorius M.D.   On: 02/07/2021 00:56   DG Chest Port 1 View  Result Date: 02/06/2021 CLINICAL DATA:  Shortness of breath EXAM: PORTABLE CHEST 1 VIEW COMPARISON:  11/01/2020, 05/17/2019 FINDINGS: Stable cardiomediastinal contours. Chronic scarring within the left  lung. No definite superimposed airspace opacity. No pleural effusion or pneumothorax. IMPRESSION: Chronic scarring within the left lung. No definite superimposed acute cardiopulmonary process. Electronically Signed   By: Duanne Guess D.O.   On: 02/06/2021 17:28     TODAY-DAY OF DISCHARGE:  Subjective:   Louden Houseworth today has no headache,no chest abdominal pain,no new weakness tingling or numbness, feels much better wants to go home today.   Objective:   Blood pressure 122/74, pulse 87, temperature 98 F (36.7 C), temperature source Oral, resp. rate 17, height 6\' 1"  (1.854 m), weight 81.6 kg, SpO2 96 %.  Intake/Output Summary (Last 24 hours) at 02/10/2021 1335 Last data filed at 02/10/2021 0838 Gross per 24 hour  Intake 560 ml  Output 1000 ml  Net -440 ml   Filed Weights   02/06/21 2300  Weight: 81.6 kg    Exam: Awake Alert, Oriented *3, No  new F.N deficits, Normal affect Susan Moore.AT,PERRAL Supple Neck,No JVD, No cervical lymphadenopathy appriciated.  Symmetrical Chest wall movement, Good air movement bilaterally, CTAB RRR,No Gallops,Rubs or new Murmurs, No Parasternal Heave +ve B.Sounds, Abd Soft, Non tender, No organomegaly appriciated, No rebound -guarding or rigidity. No Cyanosis, Clubbing or edema, No new Rash or bruise   PERTINENT RADIOLOGIC STUDIES: No results found.   PERTINENT LAB RESULTS: CBC: Recent Labs    02/08/21 0127  WBC 7.7  HGB 12.5*  HCT 38.3*  PLT 162   CMET CMP     Component Value Date/Time   NA 130 (L) 02/09/2021 0054   K 4.1 02/09/2021 0054   CL 99 02/09/2021 0054   CO2 25 02/09/2021 0054   GLUCOSE 113 (H) 02/09/2021 0054   BUN 35 (H) 02/09/2021 0054   CREATININE 1.36 (H) 02/09/2021 0054   CREATININE 1.24 12/24/2020 1124   CALCIUM 9.4 02/09/2021 0054   PROT 8.9 (H) 02/06/2021 1710   ALBUMIN 2.8 (L) 02/06/2021 1710   AST 19 02/06/2021 1710   ALT 11 02/06/2021 1710   ALKPHOS 59 02/06/2021 1710   BILITOT 1.1 02/06/2021 1710   GFRNONAA >60 02/09/2021 0054   GFRNONAA 68 12/04/2019 1118   GFRAA 79 12/04/2019 1118    GFR Estimated Creatinine Clearance: 74.3 mL/min (A) (by C-G formula based on SCr of 1.36 mg/dL (H)). No results for input(s): LIPASE, AMYLASE in the last 72 hours. No results for input(s): CKTOTAL, CKMB, CKMBINDEX, TROPONINI in the last 72 hours. Invalid input(s): POCBNP No results for input(s): DDIMER in the last 72 hours. No results for input(s): HGBA1C in the last 72 hours. No results for input(s): CHOL, HDL, LDLCALC, TRIG, CHOLHDL, LDLDIRECT in the last 72 hours. No results for input(s): TSH, T4TOTAL, T3FREE, THYROIDAB in the last 72 hours.  Invalid input(s): FREET3 No results for input(s): VITAMINB12, FOLATE, FERRITIN, TIBC, IRON, RETICCTPCT in the last 72 hours. Coags: No results for input(s): INR in the last 72 hours.  Invalid input(s): PT Microbiology: Recent  Results (from the past 240 hour(s))  Resp Panel by RT-PCR (Flu A&B, Covid) Nasopharyngeal Swab     Status: None   Collection Time: 02/06/21  4:34 PM   Specimen: Nasopharyngeal Swab; Nasopharyngeal(NP) swabs in vial transport medium  Result Value Ref Range Status   SARS Coronavirus 2 by RT PCR NEGATIVE NEGATIVE Final    Comment: (NOTE) SARS-CoV-2 target nucleic acids are NOT DETECTED.  The SARS-CoV-2 RNA is generally detectable in upper respiratory specimens during the acute phase of infection. The lowest concentration of SARS-CoV-2 viral copies this assay can detect is  138 copies/mL. A negative result does not preclude SARS-Cov-2 infection and should not be used as the sole basis for treatment or other patient management decisions. A negative result may occur with  improper specimen collection/handling, submission of specimen other than nasopharyngeal swab, presence of viral mutation(s) within the areas targeted by this assay, and inadequate number of viral copies(<138 copies/mL). A negative result must be combined with clinical observations, patient history, and epidemiological information. The expected result is Negative.  Fact Sheet for Patients:  BloggerCourse.com  Fact Sheet for Healthcare Providers:  SeriousBroker.it  This test is no t yet approved or cleared by the [place] FDA and  has been authorized for detection and/or diagnosis of SARS-CoV-2 by FDA under an Emergency Use Authorization (EUA). This EUA will remain  in effect (meaning this test can be used) for the duration of the COVID-19 declaration under Section 564(b)(1) of the Act, 21 U.S.C.section 360bbb-3(b)(1), unless the authorization is terminated  or revoked sooner.       Influenza A by PCR NEGATIVE NEGATIVE Final   Influenza B by PCR NEGATIVE NEGATIVE Final    Comment: (NOTE) The Xpert Xpress SARS-CoV-2/FLU/RSV plus assay is intended as an aid in the  diagnosis of influenza from Nasopharyngeal swab specimens and should not be used as a sole basis for treatment. Nasal washings and aspirates are unacceptable for Xpert Xpress SARS-CoV-2/FLU/RSV testing.  Fact Sheet for Patients: BloggerCourse.com  Fact Sheet for Healthcare Providers: SeriousBroker.it  This test is not yet approved or cleared by the [place] FDA and has been authorized for detection and/or diagnosis of SARS-CoV-2 by FDA under an Emergency Use Authorization (EUA). This EUA will remain in effect (meaning this test can be used) for the duration of the COVID-19 declaration under Section 564(b)(1) of the Act, 21 U.S.C. section 360bbb-3(b)(1), unless the authorization is terminated or revoked.  Performed at [facility], [address] [place], [place] [address]   Culture, blood (routine x 2)     Status: None (Preliminary result)   Collection Time: 02/06/21  5:10 PM   Specimen: BLOOD  Result Value Ref Range Status   Specimen Description BLOOD SITE NOT SPECIFIED  Final   Special Requests   Final    BOTTLES DRAWN AEROBIC AND ANAEROBIC Blood Culture results may not be optimal due to an inadequate volume of blood received in culture bottles   Culture   Final    NO GROWTH 4 DAYS Performed at [facility], [address] [place], [place] [address]    Report Status PENDING  Incomplete  Culture, blood (routine x 2)     Status: None (Preliminary result)   Collection Time: 02/06/21  5:20 PM   Specimen: BLOOD  Result Value Ref Range Status   Specimen Description BLOOD LEFT ANTECUBITAL  Final   Special Requests   Final    BOTTLES DRAWN AEROBIC AND ANAEROBIC Blood Culture adequate volume   Culture   Final    NO GROWTH 4 DAYS Performed at [facility], [address], [place], [place] [address]    Report Status PENDING  Incomplete    FURTHER DISCHARGE INSTRUCTIONS:  Get Medicines reviewed and  adjusted: Please take all your medications with you for your next visit with your Primary MD  Laboratory/radiological data: Please request your Primary MD to go over all hospital tests and procedure/radiological results at the follow up, please ask your Primary MD to get all Hospital records sent to his/her office.  In some cases, they will be blood work, cultures and biopsy results pending at the time of your discharge. Please request that your primary care M.D. goes through all the records of your hospital data and follows up on these results.  Also Note the following: If you experience worsening of your admission symptoms, develop shortness of breath, life threatening emergency, suicidal or homicidal thoughts you must seek medical attention immediately by calling 911 or calling your MD immediately  if symptoms less severe.  You must read complete instructions/literature along with all the possible adverse reactions/side effects for all the Medicines you take and that have been prescribed to you. Take any new Medicines after you have completely understood and accpet all the possible adverse reactions/side effects.   Do not drive when taking Pain medications or sleeping medications (Benzodaizepines)  Do not take more than prescribed Pain, Sleep and Anxiety Medications. It is not advisable to combine anxiety,sleep and pain medications without talking with your primary care practitioner  Special Instructions: If you have smoked or chewed Tobacco  in the last 2 yrs please stop smoking, stop any regular Alcohol  and or any Recreational drug use.  Wear Seat belts while driving.  Please note: You were cared for by a hospitalist during your hospital stay. Once you are discharged, your primary care physician will handle any further medical issues. Please note that NO REFILLS for any discharge medications will be authorized once you are discharged, as it is imperative that you return to your primary  care physician (or establish a relationship with a primary care physician if you do not have one) for your post hospital discharge needs so that they can reassess your need for medications and monitor your lab values.  Total Time spent coordinating discharge including counseling, education and face to face time equals 35 minutes.  SignedJeoffrey Massed 02/10/2021 1:35 PM

## 2021-02-10 NOTE — Plan of Care (Signed)
  Problem: Activity: Goal: Ability to tolerate increased activity will improve 02/10/2021 0355 by Zara Chess, RN Outcome: Progressing 02/10/2021 0354 by Zara Chess, RN Outcome: Progressing   Problem: Respiratory: Goal: Ability to maintain adequate ventilation will improve 02/10/2021 0355 by Zara Chess, RN Outcome: Progressing 02/10/2021 0354 by Zara Chess, RN Outcome: Progressing Goal: Ability to maintain a clear airway will improve 02/10/2021 0355 by Zara Chess, RN Outcome: Progressing 02/10/2021 0354 by Zara Chess, RN Outcome: Progressing   Problem: Education: Goal: Knowledge of General Education information will improve Description: Including pain rating scale, medication(s)/side effects and non-pharmacologic comfort measures Outcome: Progressing   Problem: Health Behavior/Discharge Planning: Goal: Ability to manage health-related needs will improve Outcome: Progressing   Problem: Clinical Measurements: Goal: Ability to maintain clinical measurements within normal limits will improve Outcome: Progressing Goal: Will remain free from infection Outcome: Progressing Goal: Diagnostic test results will improve Outcome: Progressing Goal: Respiratory complications will improve Outcome: Progressing Goal: Cardiovascular complication will be avoided Outcome: Progressing   Problem: Activity: Goal: Risk for activity intolerance will decrease Outcome: Progressing   Problem: Nutrition: Goal: Adequate nutrition will be maintained Outcome: Progressing   Problem: Coping: Goal: Level of anxiety will decrease Outcome: Progressing   Problem: Elimination: Goal: Will not experience complications related to bowel motility Outcome: Progressing Goal: Will not experience complications related to urinary retention Outcome: Progressing   Problem: Pain Managment: Goal: General experience of comfort will improve Outcome:  Progressing   Problem: Safety: Goal: Ability to remain free from injury will improve Outcome: Progressing   Problem: Skin Integrity: Goal: Risk for impaired skin integrity will decrease Outcome: Progressing

## 2021-02-10 NOTE — Progress Notes (Signed)
Subjective: Patient still with some coughing but on the whole feels better   Antibiotics:  Anti-infectives (From admission, onward)    Start     Dose/Rate Route Frequency Ordered Stop   02/10/21 0000  azithromycin (ZITHROMAX) 600 MG tablet        1,200 mg Oral Every 7 days 02/10/21 1031     02/10/21 0000  Darunavir-Cobicistat-Emtricitabine-Tenofovir Alafenamide (SYMTUZA) 800-150-200-10 MG TABS        1 tablet Oral Daily with breakfast 02/10/21 1031     02/10/21 0000  dolutegravir (TIVICAY) 50 MG tablet        50 mg Oral Daily 02/10/21 1031     02/10/21 0000  sulfamethoxazole-trimethoprim (BACTRIM DS) 800-160 MG tablet        1 tablet Oral Daily 02/10/21 1031     02/09/21 1000  azithromycin (ZITHROMAX) tablet 500 mg        500 mg Oral Daily 02/09/21 0753 02/12/21 0959   02/08/21 1015  azithromycin (ZITHROMAX) tablet 500 mg  Status:  Discontinued        500 mg Oral Daily 02/08/21 0919 02/08/21 0920   02/07/21 1630  sulfamethoxazole-trimethoprim (BACTRIM DS) 800-160 MG per tablet 1 tablet        1 tablet Oral Daily 02/07/21 1445     02/07/21 1000  dolutegravir (TIVICAY) tablet 50 mg        50 mg Oral Daily 02/06/21 2321     02/07/21 0800  cefTRIAXone (ROCEPHIN) 2 g in sodium chloride 0.9 % 100 mL IVPB        2 g 200 mL/hr over 30 Minutes Intravenous Every 24 hours 02/06/21 2319 02/12/21 0759   02/07/21 0800  Darunavir-Cobicistat-Emtricitabine-Tenofovir Alafenamide (SYMTUZA) 800-150-200-10 MG TABS 1 tablet        1 tablet Oral Daily with breakfast 02/06/21 2321     02/07/21 0000  sulfamethoxazole-trimethoprim (BACTRIM) 480 mg in dextrose 5 % 500 mL IVPB  Status:  Discontinued        480 mg 353.3 mL/hr over 90 Minutes Intravenous Every 8 hours 02/06/21 2355 02/07/21 1445   02/06/21 2330  azithromycin (ZITHROMAX) 500 mg in sodium chloride 0.9 % 250 mL IVPB  Status:  Discontinued        500 mg 250 mL/hr over 60 Minutes Intravenous Daily at bedtime 02/06/21 2319 02/08/21 0919    02/06/21 2145  cefTRIAXone (ROCEPHIN) 1 g in sodium chloride 0.9 % 100 mL IVPB        1 g 200 mL/hr over 30 Minutes Intravenous  Once 02/06/21 2143 02/06/21 2250   02/06/21 2145  azithromycin (ZITHROMAX) 500 mg in sodium chloride 0.9 % 250 mL IVPB  Status:  Discontinued        500 mg 250 mL/hr over 60 Minutes Intravenous  Once 02/06/21 2143 02/06/21 2216       Medications: Scheduled Meds:  azithromycin  500 mg Oral Daily   benzonatate  200 mg Oral TID   Darunavir-Cobicistat-Emtricitabine-Tenofovir Alafenamide  1 tablet Oral Q breakfast   dolutegravir  50 mg Oral Daily   enoxaparin (LOVENOX) injection  80 mg Subcutaneous Q12H   feeding supplement  237 mL Oral BID BM   guaiFENesin  600 mg Oral BID   multivitamin with minerals  1 tablet Oral QHS   sulfamethoxazole-trimethoprim  1 tablet Oral Daily   Continuous Infusions:  cefTRIAXone (ROCEPHIN)  IV 2 g (02/10/21 0838)   PRN Meds:.acetaminophen **OR** acetaminophen, albuterol, diphenhydrAMINE, ondansetron **OR** ondansetron (  ZOFRAN) IV, senna-docusate    Objective: Weight change:   Intake/Output Summary (Last 24 hours) at 02/10/2021 1036 Last data filed at 02/10/2021 0300 Gross per 24 hour  Intake 360 ml  Output 1000 ml  Net -640 ml    Blood pressure 122/74, pulse 87, temperature 98 F (36.7 C), temperature source Oral, resp. rate 17, height 6\' 1"  (1.854 m), weight 81.6 kg, SpO2 96 %. Temp:  [97.7 F (36.5 C)-98.1 F (36.7 C)] 98 F (36.7 C) (11/28 0806) Pulse Rate:  [77-87] 87 (11/28 0806) Resp:  [17-18] 17 (11/28 0806) BP: (105-123)/(74-89) 122/74 (11/28 0806) SpO2:  [95 %-96 %] 96 % (11/28 0806)  Physical Exam: Physical Exam Constitutional:      Appearance: He is well-developed.  HENT:     Head: Normocephalic and atraumatic.     Nose: Nose normal.  Eyes:     Extraocular Movements: Extraocular movements intact.  Cardiovascular:     Rate and Rhythm: Normal rate and regular rhythm.     Heart sounds: No  murmur heard.   No gallop.  Pulmonary:     Effort: No respiratory distress.     Breath sounds: No stridor. Rhonchi present. No rales.  Chest:     Chest wall: No tenderness.  Abdominal:     General: Abdomen is flat. Bowel sounds are normal.  Neurological:     General: No focal deficit present.     Mental Status: He is alert and oriented to person, place, and time.  Psychiatric:        Mood and Affect: Mood normal.        Behavior: Behavior normal.        Thought Content: Thought content normal.        Judgment: Judgment normal.     CBC:    BMET Recent Labs    02/08/21 0127 02/09/21 0054  NA 133* 130*  K 4.1 4.1  CL 100 99  CO2 26 25  GLUCOSE 129* 113*  BUN 19 35*  CREATININE 1.36* 1.36*  CALCIUM 8.9 9.4      Liver Panel  No results for input(s): PROT, ALBUMIN, AST, ALT, ALKPHOS, BILITOT, BILIDIR, IBILI in the last 72 hours.      Sedimentation Rate No results for input(s): ESRSEDRATE in the last 72 hours. C-Reactive Protein No results for input(s): CRP in the last 72 hours.  Micro Results: Recent Results (from the past 720 hour(s))  Resp Panel by RT-PCR (Flu A&B, Covid) Nasopharyngeal Swab     Status: None   Collection Time: 02/06/21  4:34 PM   Specimen: Nasopharyngeal Swab; Nasopharyngeal(NP) swabs in vial transport medium  Result Value Ref Range Status   SARS Coronavirus 2 by RT PCR NEGATIVE NEGATIVE Final    Comment: (NOTE) SARS-CoV-2 target nucleic acids are NOT DETECTED.  The SARS-CoV-2 RNA is generally detectable in upper respiratory specimens during the acute phase of infection. The lowest concentration of SARS-CoV-2 viral copies this assay can detect is 138 copies/mL. A negative result does not preclude SARS-Cov-2 infection and should not be used as the sole basis for treatment or other patient management decisions. A negative result may occur with  improper specimen collection/handling, submission of specimen other than nasopharyngeal swab,  presence of viral mutation(s) within the areas targeted by this assay, and inadequate number of viral copies(<138 copies/mL). A negative result must be combined with clinical observations, patient history, and epidemiological information. The expected result is Negative.  Fact Sheet for Patients:  EntrepreneurPulse.com.au  Fact  Sheet for Healthcare Providers:  IncredibleEmployment.be  This test is no t yet approved or cleared by the [place] FDA and  has been authorized for detection and/or diagnosis of SARS-CoV-2 by FDA under an Emergency Use Authorization (EUA). This EUA will remain  in effect (meaning this test can be used) for the duration of the COVID-19 declaration under Section 564(b)(1) of the Act, 21 U.S.C.section 360bbb-3(b)(1), unless the authorization is terminated  or revoked sooner.       Influenza A by PCR NEGATIVE NEGATIVE Final   Influenza B by PCR NEGATIVE NEGATIVE Final    Comment: (NOTE) The Xpert Xpress SARS-CoV-2/FLU/RSV plus assay is intended as an aid in the diagnosis of influenza from Nasopharyngeal swab specimens and should not be used as a sole basis for treatment. Nasal washings and aspirates are unacceptable for Xpert Xpress SARS-CoV-2/FLU/RSV testing.  Fact Sheet for Patients: EntrepreneurPulse.com.au  Fact Sheet for Healthcare Providers: IncredibleEmployment.be  This test is not yet approved or cleared by the [place] FDA and has been authorized for detection and/or diagnosis of SARS-CoV-2 by FDA under an Emergency Use Authorization (EUA). This EUA will remain in effect (meaning this test can be used) for the duration of the COVID-19 declaration under Section 564(b)(1) of the Act, 21 U.S.C. section 360bbb-3(b)(1), unless the authorization is terminated or revoked.  Performed at [facility], [place] [address] [place], [place] [address]   Culture, blood  (routine x 2)     Status: None (Preliminary result)   Collection Time: 02/06/21  5:10 PM   Specimen: BLOOD  Result Value Ref Range Status   Specimen Description BLOOD SITE NOT SPECIFIED  Final   Special Requests   Final    BOTTLES DRAWN AEROBIC AND ANAEROBIC Blood Culture results may not be optimal due to an inadequate volume of blood received in culture bottles   Culture   Final    NO GROWTH 4 DAYS Performed at [facility], [place] [address], [place], [place] [address]    Report Status PENDING  Incomplete  Culture, blood (routine x 2)     Status: None (Preliminary result)   Collection Time: 02/06/21  5:20 PM   Specimen: BLOOD  Result Value Ref Range Status   Specimen Description BLOOD LEFT ANTECUBITAL  Final   Special Requests   Final    BOTTLES DRAWN AEROBIC AND ANAEROBIC Blood Culture adequate volume   Culture   Final    NO GROWTH 4 DAYS Performed at [facility], [place] [address], [place],  [address]    Report Status PENDING  Incomplete    Studies/Results: No results found.    Assessment/Plan:  INTERVAL HISTORY: Patient has improved since admission   Principal Problem:   CAP (community acquired pneumonia) Active Problems:   HIV (human immunodeficiency virus infection) ([place])   AKI (acute kidney injury) ([place])   Acute respiratory failure with hypoxia ([place])   History of pulmonary embolus (PE)   S/P colostomy ([place])   Acute hypoxemic respiratory failure ([name])   Flank pain   Hip pain   Bronchiectasis with acute exacerbation ([name])    [name] is a 50 y.o. 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  He has CT scan suggestive of community-acquired pneumonia.  #1  Commune acquired pneumonia: He is completed 5 days  of therapy and antibiotics can be stopped  #2  PE DVT: Should be on  subcutaneous Lovenox at discharge.  My understanding from talking with ID pharmacy is that the patient's insurance and specific pharmacy are very rigid about where he can have meds filled and require him to be on the phone to confirm refills or medication to be refilled at a different pharmacy such as our transitions of care pharmacy.  They also apparently frequently neglect to use a [facility] interpreter when communicating with them.  #3 HIV and AIDS: Viral load is well controlled as evidenced by viral load of 50 on February 08, 2021 in the hospital  Lab Results  Component Value Date   HIV1RNAQUANT 50 02/08/2021   CD4 to come up to 62 when last checked in  Lab Results  Component Value Date   CD4TABS 62 (L) 12/24/2020   CD4TABS <35 (L) 11/04/2020   CD4TABS 99 (L) 12/04/2019    Continue TIVICAY and SYMTUZA plus Bactrim for PCP prevention once daily and 2 tablets of azithromycin once weekly for MAI prophylaxis   #5  Bronchiectasis: Would benefit from being seen by pulmonary as an outpatient    [name] has an appointment on  02/27/2021 with Janene Madeira, NP at [facility] is located in the [facility] at  [place] in [place].  [address], which is located to the left of the elevators.  Phone: [phone]  Fax: [phone]  [email or web address]  He should arrive 15 to 30 minutes prior to his appointment.   Would ideally like to have him leave with meds in hand but uncertain his insurance will allow this.   I spent 36 minutes with the patient including face to face counseling of the patient using telephonic [name] translator regards to his HIV disease pneumonia DVT PE treatment personally reviewing reviewing his viral load his most recent CD4 count CBC CMP along with review of medical records before and during the visit and in coordination of his care.    LOS: 3 days    Alcide Evener 02/10/2021, 10:36 AM

## 2021-02-10 NOTE — Progress Notes (Signed)
SATURATION QUALIFICATIONS: (This note is used to comply with regulatory documentation for home oxygen)  Patient Saturations on Room Air at Rest = 96%  Patient Saturations on Room Air while Ambulating = 93%   Pt did very well walking with no O2. Pt denied any dizziness, or distress.

## 2021-02-11 LAB — CULTURE, BLOOD (ROUTINE X 2)
Culture: NO GROWTH
Culture: NO GROWTH
Special Requests: ADEQUATE

## 2021-02-14 ENCOUNTER — Other Ambulatory Visit (HOSPITAL_COMMUNITY): Payer: Self-pay

## 2021-02-26 ENCOUNTER — Other Ambulatory Visit (HOSPITAL_COMMUNITY): Payer: Self-pay

## 2021-02-26 ENCOUNTER — Telehealth (HOSPITAL_COMMUNITY): Payer: Self-pay | Admitting: Pharmacist

## 2021-02-27 ENCOUNTER — Ambulatory Visit (INDEPENDENT_AMBULATORY_CARE_PROVIDER_SITE_OTHER): Payer: BC Managed Care – PPO | Admitting: Infectious Diseases

## 2021-02-27 ENCOUNTER — Encounter: Payer: Self-pay | Admitting: Infectious Diseases

## 2021-02-27 ENCOUNTER — Other Ambulatory Visit: Payer: Self-pay

## 2021-02-27 VITALS — BP 112/74 | HR 105 | Temp 97.5°F | Wt 186.0 lb

## 2021-02-27 DIAGNOSIS — J471 Bronchiectasis with (acute) exacerbation: Secondary | ICD-10-CM

## 2021-02-27 DIAGNOSIS — B20 Human immunodeficiency virus [HIV] disease: Secondary | ICD-10-CM

## 2021-02-27 DIAGNOSIS — L270 Generalized skin eruption due to drugs and medicaments taken internally: Secondary | ICD-10-CM

## 2021-02-27 MED ORDER — TRIAMCINOLONE 0.1 % CREAM:EUCERIN CREAM 1:1: 1.0000 "application " | TOPICAL_CREAM | Freq: Three times a day (TID) | CUTANEOUS | 2 refills | Status: DC | PRN

## 2021-02-27 MED ORDER — DIPHENHYDRAMINE HCL 50 MG/ML IJ SOLN
50.0000 mg | Freq: Once | INTRAMUSCULAR | Status: AC
  Administered 2021-02-27: 50 mg via INTRAMUSCULAR

## 2021-02-27 NOTE — Assessment & Plan Note (Addendum)
Primary problem for [name] today. He has had azithro and bactrim in the past. He has a bottle of Janumet and Metformin today - unclear why he has both but I worry he is having an allergic reaction to Metformin (?dose dependent). Will have him stop this and see if his symptoms resolve after a few days.   He has been scratching his whole body the entire visit. Will give 50 mg IM diphenhydramine today. Discussed PO benadryl OTC q6h for ongoing itching. I showed him a picture of the generic medication box at his local pharmacy.  Swelling in his legs - showed him compression stockings at [employer]. Would like for him to pick up 8-15 mmhg and wear during the day. Can wear at night for the first few days to decongest as long as no pain/throbbing. He has easily palpated distal pulses. Anticipate that this will resolve with stopping offending medication.   Some of his rashes may be more chronic given skin changes at [facility] fossa's. Will use topical triamcinolone+eucerine cream BID. Avoid systemic steroids with DM hx and Cobicistat if we can.   Fu with ID in 2 weeks to ensure this resolves. He will need follow up with PCP (Not sure he has one...) to ensure diabetes regimen is optimized.

## 2021-02-27 NOTE — Assessment & Plan Note (Signed)
Multidrug resistant on salvage Tivicay + Symtuza. Last VL 50 indicating suppressed virus. CD4 last < 100 in October. Suspect given long-standing advanced disease it will take him a while to reconstitute, if he will.  Defer labs today. Appreciate pharmacy team to help him secure his medications.  Continue bactrim + azithro proph.

## 2021-02-27 NOTE — Patient Instructions (Addendum)
Stop the Metformin- I worry this is causing you an itchy rash   Start over the counter benadryl 25 mg tablets every 4-6 hours   Compression stockings for your legs - wear them during the day to help with the swelling.   Please come back in 2 weeks to check in with Dr. Daiva Eves or Dr. Drue Second to make sure your swelling and rash go away.   Will send in a cream for the itching and dryness of your skin.

## 2021-02-27 NOTE — Progress Notes (Signed)
Patient ID: [name], male   DOB: [birth year], 50 y.o.   MRN: [id number]  HPI  Tablet interpretor Shanon Brow used during visit:  [name] is a 50 y.o. male with advanced HIV disease on Symtuza + Tivicay and prophylaxis with Azithro and Bactrim. 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 He has been working with pharmacy to help secure his medications to be sent to him - this has been challenging in the past.   He was hospitalized at the end of November for CAP and is here for FU today. His cough is resolved and describes no sputum production. Inhalers are helping. No wheezing/SOB.   Main complaint today is rash and itching x 2 weeks. He is unable to sleep at all due to this itching. He feels like his eyes are puffy. No itching in mouth/throat. No difficulty breathing.  Has not used anything for it. He has all of his medications with him today and states that the Metformin and tessalon pearls are the new medicines recently added. He is also concerned over the LE swelling he has had develop over this time frame too. No dyspnea and able to sleep flat. He does not know of any heart history.   Review of Systems  Constitutional:  Positive for malaise/fatigue. Negative for chills, fever and weight loss.  Respiratory:  Negative for cough, sputum production and shortness of breath.   Cardiovascular:  Negative for chest pain.  Gastrointestinal:  Negative for abdominal pain and diarrhea.  Musculoskeletal:        LE edema bilaterally  Skin:  Positive for itching and rash.    Sochx: no smoking or drinking  Outpatient Encounter Medications as of 02/27/2021  Medication Sig   albuterol (VENTOLIN HFA) 108 (90 Base) MCG/ACT inhaler Inhale 2 puffs into the lungs every 6 (six) hours as needed for wheezing or shortness of breath.   azithromycin (ZITHROMAX) 600 MG tablet Take 2 tablets (1,200 mg total) by mouth every 7 (seven) days.    benzonatate (TESSALON) 200 MG capsule Take 1 capsule (200 mg total) by mouth 3 (three) times daily as needed for cough.   Darunavir-Cobicistat-Emtricitabine-Tenofovir Alafenamide (SYMTUZA) 800-150-200-10 MG TABS Take 1 tablet by mouth daily with breakfast.   dolutegravir (TIVICAY) 50 MG tablet Take 1 tablet (50 mg total) by mouth daily.   enoxaparin (LOVENOX) 120 MG/0.8ML injection Inject 1 syringe (120 mg total) into the skin daily.   feeding supplement (ENSURE ENLIVE / ENSURE PLUS) LIQD Take 237 mLs by mouth 2 (two) times daily between meals.   guaiFENesin (MUCINEX) 600 MG 12 hr tablet Take 1 tablet (600 mg total) by mouth 2 (two) times daily.   sulfamethoxazole-trimethoprim (BACTRIM DS) 800-160 MG tablet Take 1 tablet by mouth daily.   [DISCONTINUED] metFORMIN (GLUCOPHAGE) 500 MG tablet Take 1 tablet (500 mg total) by mouth 2 (two) times daily with a meal.   [DISCONTINUED] sitaGLIPtin-metformin (JANUMET) 50-500 MG tablet Take 1 tablet by mouth 2 (two) times daily with a meal.   [DISCONTINUED] Triamcinolone Acetonide (TRIAMCINOLONE 0.1 % CREAM : EUCERIN) CREA Apply 1 application topically 3 (three) times daily as needed.   Triamcinolone Acetonide (TRIAMCINOLONE 0.1 % CREAM : EUCERIN) CREA Apply 1 application topically 3 (three) times daily as needed.   [EXPIRED] diphenhydrAMINE (BENADRYL) injection 50 mg    No facility-administered encounter medications on file as of 02/27/2021.     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   CKD (chronic kidney disease) stage 3, GFR 30-59 ml/min ([name]) 11/01/2020   Acute pulmonary embolism ([name]) 11/01/2020   MSSA  bacteremia    Acute respiratory failure with hypoxia ([name]) 05/17/2019   Type 2 diabetes mellitus ([name]) 05/17/2019   DKA (diabetic ketoacidoses) 11/10/2018   Genital warts 05/06/2018   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   Pulmonary emphysema ([name])    Condyloma 01/04/2017   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   Chronic systolic CHF (congestive heart failure) ([name]) 04/24/2016   AKI (acute kidney injury) ([name]) 04/24/2016   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)    Normochromic normocytic anemia 02/25/2016   Protein-calorie malnutrition, severe 02/25/2016   Acute pulmonary embolus ([name]) 02/24/2016   Renal insufficiency    AIDS (acquired immune deficiency syndrome) ([name])    S/P ORIF (open reduction internal fixation) fracture 03/21/2014   CAP (community acquired pneumonia) 09/27/2013   HIV (human immunodeficiency virus infection) ([name]) 09/18/2013   Refugee health examination 09/18/2013     Health Maintenance Due  Topic Date Due   FOOT EXAM  Never done   OPHTHALMOLOGY EXAM  Never done   URINE MICROALBUMIN  Never done   TETANUS/TDAP  Never done   COVID-19 Vaccine (2 - Pfizer risk series) 01/26/2020   INFLUENZA VACCINE  10/14/2020   Pneumococcal Vaccine 105-49 Years old (3 - PPSV23 if available, else PCV20) 02/28/2021      Physical Exam   BP 112/74 (BP Location: Left Arm)    Pulse (!) 105    Temp (!) 97.5 F (36.4 C) (Oral)    Wt 186 lb (84.4 kg)    BMI 24.54 kg/m    Physical Exam Constitutional:      Appearance: Normal appearance. He is ill-appearing. He is not toxic-appearing.  HENT:     Mouth/Throat:     Mouth: Mucous membranes are moist.     Pharynx: Oropharynx is clear. No oropharyngeal exudate or posterior oropharyngeal erythema.  Eyes:     General: No scleral icterus.    Conjunctiva/sclera: Conjunctivae normal.     Comments: Seems to have lower lid swelling bilaterally  Cardiovascular:     Rate  and Rhythm: Regular rhythm. Tachycardia present.  Pulmonary:     Effort: Pulmonary effort is normal.     Breath sounds: Normal breath sounds. No stridor. No wheezing or rhonchi.  Musculoskeletal:     Right lower leg: Edema present.     Left lower leg: Edema present.  Skin:    Capillary Refill: Capillary refill takes less than 2 seconds.     Findings: Rash present. Rash is papular and urticarial.     Comments: Skin overall is very dry and looks a little lichenified on the arms. Diffuse welts not well seen on photos with erythema.   Neurological:     Mental Status: He is alert and oriented to person, place, and time.          Lab Results  Component Value Date   CD4TCELL 5 (L) 12/24/2020   Lab Results  Component Value Date   CD4TABS 62 (L) 12/24/2020   CD4TABS <35 (  L) 11/04/2020   CD4TABS 99 (L) 12/04/2019   Lab Results  Component Value Date   HIV1RNAQUANT 50 02/08/2021   No results found for: HEPBSAB Lab Results  Component Value Date   LABRPR NON REACTIVE 11/02/2020    CBC Lab Results  Component Value Date   WBC 7.7 02/08/2021   RBC 3.79 (L) 02/08/2021   HGB 12.5 (L) 02/08/2021   HCT 38.3 (L) 02/08/2021   PLT 162 02/08/2021   MCV 101.1 (H) 02/08/2021   MCH 33.0 02/08/2021   MCHC 32.6 02/08/2021   RDW 11.9 02/08/2021   LYMPHSABS 1.3 02/06/2021   MONOABS 0.9 02/06/2021   EOSABS 0.5 02/06/2021    BMET Lab Results  Component Value Date   NA 130 (L) 02/09/2021   K 4.1 02/09/2021   CL 99 02/09/2021   CO2 25 02/09/2021   GLUCOSE 113 (H) 02/09/2021   BUN 35 (H) 02/09/2021   CREATININE 1.36 (H) 02/09/2021   CALCIUM 9.4 02/09/2021   GFRNONAA >60 02/09/2021   GFRAA 79 12/04/2019      Assessment and Plan  Problem List Items Addressed This Visit       Unprioritized   HIV (human immunodeficiency virus infection) ([place]) (Chronic)    Multidrug resistant on salvage Tivicay + Symtuza. Last VL 50 indicating suppressed virus. CD4 last < 100 in October.  Suspect given long-standing advanced disease it will take him a while to reconstitute, if he will.  Defer labs today. Appreciate pharmacy team to help him secure his medications.  Continue bactrim + azithro proph.       Bronchiectasis with acute exacerbation ([place])    Resolved after inpatient treatment. Consider referral to pulmonology.       Allergic drug rash - Primary    Primary problem for [name] today. He has had azithro and bactrim in the past. He has a bottle of Janumet and Metformin today - unclear why he has both but I worry he is having an allergic reaction to Metformin (?dose dependent). Will have him stop this and see if his symptoms resolve after a few days.   He has been scratching his whole body the entire visit. Will give 50 mg IM diphenhydramine today. Discussed PO benadryl OTC q6h for ongoing itching. I showed him a picture of the generic medication box at his local pharmacy.  Swelling in his legs - showed him compression stockings at [employer]. Would like for him to pick up 8-15 mmhg and wear during the day. Can wear at night for the first few days to decongest as long as no pain/throbbing. He has easily palpated distal pulses. Anticipate that this will resolve with stopping offending medication.   Some of his rashes may be more chronic given skin changes at [facility] fossa's. Will use topical triamcinolone+eucerine cream BID. Avoid systemic steroids with DM hx and Cobicistat if we can.   Fu with ID in 2 weeks to ensure this resolves. He will need follow up with PCP (Not sure he has one...) to ensure diabetes regimen is optimized.        Janene Madeira, MSN, NP-C [facility] for Infectious Disease [email or web address] Pager: [phone] Office: [place]: [phone]

## 2021-02-27 NOTE — Assessment & Plan Note (Signed)
Resolved after inpatient treatment. Consider referral to pulmonology.

## 2021-03-13 ENCOUNTER — Ambulatory Visit (INDEPENDENT_AMBULATORY_CARE_PROVIDER_SITE_OTHER): Payer: BC Managed Care – PPO | Admitting: Family

## 2021-03-13 ENCOUNTER — Other Ambulatory Visit: Payer: Self-pay

## 2021-03-13 ENCOUNTER — Other Ambulatory Visit (HOSPITAL_COMMUNITY): Payer: Self-pay

## 2021-03-13 ENCOUNTER — Encounter: Payer: Self-pay | Admitting: Family

## 2021-03-13 VITALS — BP 118/66 | HR 98 | Temp 97.8°F | Wt 175.0 lb

## 2021-03-13 DIAGNOSIS — L270 Generalized skin eruption due to drugs and medicaments taken internally: Secondary | ICD-10-CM | POA: Diagnosis not present

## 2021-03-13 DIAGNOSIS — N1831 Chronic kidney disease, stage 3a: Secondary | ICD-10-CM | POA: Diagnosis not present

## 2021-03-13 DIAGNOSIS — E1122 Type 2 diabetes mellitus with diabetic chronic kidney disease: Secondary | ICD-10-CM

## 2021-03-13 DIAGNOSIS — B20 Human immunodeficiency virus [HIV] disease: Secondary | ICD-10-CM | POA: Diagnosis not present

## 2021-03-13 NOTE — Assessment & Plan Note (Signed)
Symptoms appear resolved with occasional itching that is likely associated with dryness. Encouraged to moisturize with Marice Potter, Aveeno, or Eucerin products and continue with Triamcinolone cream as needed. Follow up if symptoms worsen or do not improve.

## 2021-03-13 NOTE — Assessment & Plan Note (Signed)
Mr. Cornfield continues to take his salvage regimen of Symtuza and Tivicay. No signs/symptoms of opportunistic infection. Reviewed previous lab work and discussed plan of care. Will stop Azithromycin as he is on ART therapy, however will need to continue Bactrim for OI prophylaxis. Continue current dose of Symtuza and Tivicay. Medications provided in clinic. Plan for follow up in 1 month or sooner if needed with lab work on the same day.

## 2021-03-13 NOTE — Progress Notes (Signed)
Patient ID: [name], male    DOB: April 20, 1970, 50 y.o.   MRN: [id number]  Subjective:    Chief Complaint  Patient presents with   Follow-up    HPI:  [name] is a 50 y.o. male with advanced HIV disease last seen on 02/27/21 by Rexene Alberts, NP  for hospitalization follow up and acute onset rash. Bronchiectasis was resolved. There was concern for possible rash related to metformin as he was taking metformin and Janumet. Treated with 50 mg of diphenhydramine IM. Here today for follow up. [name] primary preferred language is Rosaura Carpenter and an interpreter is present to aid in communication.   [name] is doing better since his last office visit with improvement in itching especially in his hands and feet. Has been using the triamcinolone cream as needed. Swelling is decreased and continues to have mild itching. Has been taking his ART as prescribed and is down to his last few pills.     Allergies  Allergen Reactions   Metformin And Related Swelling and Rash      Outpatient Medications Prior to Visit  Medication Sig Dispense Refill   albuterol (VENTOLIN HFA) 108 (90 Base) MCG/ACT inhaler Inhale 2 puffs into the lungs every 6 (six) hours as needed for wheezing or shortness of breath. 18 g 2   azithromycin (ZITHROMAX) 600 MG tablet Take 2 tablets (1,200 mg total) by mouth every 7 (seven) days. 28 tablet 2   Darunavir-Cobicistat-Emtricitabine-Tenofovir Alafenamide (SYMTUZA) 800-150-200-10 MG TABS Take 1 tablet by mouth daily with breakfast. 30 tablet 5   dolutegravir (TIVICAY) 50 MG tablet Take 1 tablet (50 mg total) by mouth daily. 30 tablet 5   enoxaparin (LOVENOX) 120 MG/0.8ML injection Inject 1 syringe (120 mg total) into the skin daily. 30 mL 3   sulfamethoxazole-trimethoprim (BACTRIM DS) 800-160 MG tablet Take 1 tablet by mouth daily. 30 tablet 3   Triamcinolone Acetonide (TRIAMCINOLONE 0.1 % CREAM : EUCERIN) CREA Apply 1 application topically 3 (three)  times daily as needed. 1 each 2   benzonatate (TESSALON) 200 MG capsule Take 1 capsule (200 mg total) by mouth 3 (three) times daily as needed for cough. (Patient not taking: Reported on 03/13/2021) 20 capsule 0   guaiFENesin (MUCINEX) 600 MG 12 hr tablet Take 1 tablet (600 mg total) by mouth 2 (two) times daily. (Patient not taking: Reported on 03/13/2021) 30 tablet 0   No facility-administered medications prior to visit.     Past Medical History:  Diagnosis Date   Depression    "stress and depression for any man is common" (03/21/2014)   Diabetes mellitus without complication ([name])    Dyspnea    Genital warts 01/04/2017   Hepatitis    "I don't know what hepatitis I have"   HIV disease ([name])    TB (pulmonary tuberculosis)    previously treated according to refugee documentation     Past Surgical History:  Procedure Laterality Date   FRACTURE SURGERY     IM NAILING TIBIA Right 03/21/2014   ORIF ANKLE FRACTURE Right 03/21/2014   lateral malleolus/notes 03/21/2014   ORIF ANKLE FRACTURE Right 03/21/2014   Procedure: OPEN REDUCTION INTERNAL FIXATION (ORIF) pilon ;  Surgeon: Eldred Manges, MD;  Location: [facility] OR;  Service: Orthopedics;  Laterality: Right;   TEE WITHOUT CARDIOVERSION N/A 05/23/2019   Procedure: TRANSESOPHAGEAL ECHOCARDIOGRAM (TEE);  Surgeon: Chilton Si, MD;  Location: [facility] ENDOSCOPY;  Service: Cardiovascular;  Laterality: N/A;   TIBIA IM NAIL INSERTION Right  03/21/2014   Procedure: INTRAMEDULLARY (IM) NAIL TIBIAL;  Surgeon: Marybelle Killings, MD;  Location: [place];  Service: Orthopedics;  Laterality: Right;   VIDEO BRONCHOSCOPY Bilateral 03/02/2016   Procedure: VIDEO BRONCHOSCOPY WITHOUT FLUORO;  Surgeon: Javier Glazier, MD;  Location: [place];  Service: Cardiopulmonary;  Laterality: Bilateral;      Review of Systems  Constitutional:  Negative for appetite change, chills, fatigue, fever and unexpected weight change.  Eyes:  Negative for visual disturbance.  Respiratory:   Negative for cough, chest tightness, shortness of breath and wheezing.   Cardiovascular:  Negative for chest pain and leg swelling.  Gastrointestinal:  Negative for abdominal pain, constipation, diarrhea, nausea and vomiting.  Genitourinary:  Negative for dysuria, flank pain, frequency, genital sores, hematuria and urgency.  Skin:  Negative for rash.  Allergic/Immunologic: Negative for immunocompromised state.  Neurological:  Negative for dizziness and headaches.     Objective:    BP 118/66    Pulse 98    Temp 97.8 F (36.6 C) (Oral)    Wt 175 lb (79.4 kg)    SpO2 91%    BMI 23.09 kg/m  Nursing note and vital signs reviewed.  Physical Exam Constitutional:      General: He is not in acute distress.    Appearance: He is well-developed.  Eyes:     Conjunctiva/sclera: Conjunctivae normal.  Cardiovascular:     Rate and Rhythm: Normal rate and regular rhythm.     Heart sounds: Normal heart sounds. No murmur heard.   No friction rub. No gallop.  Pulmonary:     Effort: Pulmonary effort is normal. No respiratory distress.     Breath sounds: Normal breath sounds. No wheezing or rales.  Chest:     Chest wall: No tenderness.  Abdominal:     General: Bowel sounds are normal.     Palpations: Abdomen is soft.     Tenderness: There is no abdominal tenderness.  Musculoskeletal:     Cervical back: Neck supple.  Lymphadenopathy:     Cervical: No cervical adenopathy.  Skin:    General: Skin is warm and dry.     Findings: No rash.  Neurological:     Mental Status: He is alert and oriented to person, place, and time.  Psychiatric:        Behavior: Behavior normal.        Thought Content: Thought content normal.        Judgment: Judgment normal.     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   CKD (chronic kidney disease) stage 3, GFR 30-59 ml/min ([place]) 11/01/2020   Acute pulmonary embolism ([place]) 11/01/2020   MSSA bacteremia    Acute respiratory failure with hypoxia ([place]) 05/17/2019   Type 2 diabetes mellitus ([place]) 05/17/2019   DKA (diabetic ketoacidoses) 11/10/2018   Genital warts 05/06/2018  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   Pulmonary emphysema ([place])    Condyloma 01/04/2017   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[place]) [id number]   Chronic systolic CHF (congestive heart failure) ([place]) 04/24/2016   AKI (acute kidney injury) ([place]) 04/24/2016   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)    Normochromic normocytic anemia 02/25/2016   Protein-calorie malnutrition, severe 02/25/2016   Acute pulmonary embolus ([place]) 02/24/2016   Renal insufficiency    AIDS (acquired immune deficiency syndrome) ([place])    S/P ORIF (open reduction internal fixation) fracture 03/21/2014   CAP (community acquired pneumonia) 09/27/2013   HIV (human immunodeficiency virus infection) ([place]) 09/18/2013   Refugee health examination 09/18/2013     Problem List Items Addressed This Visit       Endocrine   Type 2 diabetes mellitus ([name]) (Chronic)    Last A1c of 6.0. Will continue to hold metformin at this time and plan for A1c in 1 month to determine continued need for medication or if diabetes can be lifestyle controlled.         Musculoskeletal and Integument   Allergic drug rash    Symptoms appear resolved with occasional itching that is likely associated with dryness. Encouraged to  moisturize with Hulan Fray, Aveeno, or Eucerin products and continue with Triamcinolone cream as needed. Follow up if symptoms worsen or do not improve.         Other   HIV (human immunodeficiency virus infection) ([place]) (Chronic)    [name] continues to take his salvage regimen of Symtuza and Tivicay. No signs/symptoms of opportunistic infection. Reviewed previous lab work and discussed plan of care. Will stop Azithromycin as he is on ART therapy, however will need to continue Bactrim for OI prophylaxis. Continue current dose of Symtuza and Tivicay. Medications provided in clinic. Plan for follow up in 1 month or sooner if needed with lab work on the same day.         I am having [name] maintain his azithromycin, Symtuza, Tivicay, enoxaparin, sulfamethoxazole-trimethoprim, guaiFENesin, benzonatate, albuterol, and triamcinolone 0.1 % cream : eucerin.   Follow-up: Return in about 1 month (around 04/13/2021), or if symptoms worsen or fail to improve.   Terri Piedra, MSN, FNP-C Nurse Practitioner [facility] for Infectious Disease [place] number: [phone]

## 2021-03-13 NOTE — Patient Instructions (Addendum)
Nice to see you.  Continue to take your medications daily as prescribed.  Use a general mositurizing lotion like Dove, Aveeno or Eucerin.  Check with Clinic to get your Bactrim.   STOP taking the Azithromycin.  Plan for follow up with Dr. Drue Second in 1 month for regular care.   Have a great day and stay safe!

## 2021-03-13 NOTE — Assessment & Plan Note (Signed)
Last A1c of 6.0. Will continue to hold metformin at this time and plan for A1c in 1 month to determine continued need for medication or if diabetes can be lifestyle controlled.

## 2021-03-20 ENCOUNTER — Telehealth: Payer: Self-pay

## 2021-03-20 NOTE — Telephone Encounter (Addendum)
[facility] Patient Advocate Encounter  Patient's medications(Symtuza & Tivicay) have been couriered to [facility] and will be picked up .  Clearance Coots , CPhT [facility] Patient [facility] for Infectious Disease Phone: [phone] Fax:  [phone]

## 2021-03-27 ENCOUNTER — Encounter (HOSPITAL_COMMUNITY): Payer: Self-pay

## 2021-03-27 ENCOUNTER — Emergency Department (HOSPITAL_COMMUNITY)
Admission: EM | Admit: 2021-03-27 | Discharge: 2021-03-28 | Disposition: A | Discharge status: Home / Self Care | Payer: BC Managed Care – PPO | Attending: Emergency Medicine | Admitting: Emergency Medicine

## 2021-03-27 ENCOUNTER — Emergency Department (HOSPITAL_COMMUNITY): Payer: BC Managed Care – PPO

## 2021-03-27 ENCOUNTER — Other Ambulatory Visit: Payer: Self-pay

## 2021-03-27 DIAGNOSIS — L270 Generalized skin eruption due to drugs and medicaments taken internally: Secondary | ICD-10-CM

## 2021-03-27 DIAGNOSIS — L271 Localized skin eruption due to drugs and medicaments taken internally: Secondary | ICD-10-CM | POA: Insufficient documentation

## 2021-03-27 DIAGNOSIS — Z21 Asymptomatic human immunodeficiency virus [HIV] infection status: Secondary | ICD-10-CM | POA: Diagnosis not present

## 2021-03-27 DIAGNOSIS — Z7901 Long term (current) use of anticoagulants: Secondary | ICD-10-CM | POA: Insufficient documentation

## 2021-03-27 DIAGNOSIS — R0602 Shortness of breath: Secondary | ICD-10-CM | POA: Diagnosis not present

## 2021-03-27 DIAGNOSIS — R21 Rash and other nonspecific skin eruption: Secondary | ICD-10-CM | POA: Diagnosis present

## 2021-03-27 DIAGNOSIS — L299 Pruritus, unspecified: Secondary | ICD-10-CM | POA: Diagnosis not present

## 2021-03-27 LAB — CBC WITH DIFFERENTIAL/PLATELET
Abs Immature Granulocytes: 0.02 10*3/uL (ref 0.00–0.07)
Basophils Absolute: 0 10*3/uL (ref 0.0–0.1)
Basophils Relative: 0 %
Eosinophils Absolute: 1.3 10*3/uL — ABNORMAL HIGH (ref 0.0–0.5)
Eosinophils Relative: 22 %
HCT: 44.3 % (ref 39.0–52.0)
Hemoglobin: 15 g/dL (ref 13.0–17.0)
Immature Granulocytes: 0 %
Lymphocytes Relative: 25 %
Lymphs Abs: 1.5 10*3/uL (ref 0.7–4.0)
MCH: 33.8 pg (ref 26.0–34.0)
MCHC: 33.9 g/dL (ref 30.0–36.0)
MCV: 99.8 fL (ref 80.0–100.0)
Monocytes Absolute: 0.5 10*3/uL (ref 0.1–1.0)
Monocytes Relative: 8 %
Neutro Abs: 2.6 10*3/uL (ref 1.7–7.7)
Neutrophils Relative %: 45 %
Platelets: 183 10*3/uL (ref 150–400)
RBC: 4.44 MIL/uL (ref 4.22–5.81)
RDW: 11.9 % (ref 11.5–15.5)
WBC: 6 10*3/uL (ref 4.0–10.5)
nRBC: 0 % (ref 0.0–0.2)

## 2021-03-27 LAB — COMPREHENSIVE METABOLIC PANEL
ALT: 15 U/L (ref 0–44)
AST: 21 U/L (ref 15–41)
Albumin: 3.6 g/dL (ref 3.5–5.0)
Alkaline Phosphatase: 62 U/L (ref 38–126)
Anion gap: 10 (ref 5–15)
BUN: 19 mg/dL (ref 6–20)
CO2: 27 mmol/L (ref 22–32)
Calcium: 8.6 mg/dL — ABNORMAL LOW (ref 8.9–10.3)
Chloride: 98 mmol/L (ref 98–111)
Creatinine, Ser: 1.72 mg/dL — ABNORMAL HIGH (ref 0.61–1.24)
GFR, Estimated: 48 mL/min — ABNORMAL LOW (ref >60)
Glucose, Bld: 106 mg/dL — ABNORMAL HIGH (ref 70–99)
Potassium: 3.6 mmol/L (ref 3.5–5.1)
Sodium: 135 mmol/L (ref 135–145)
Total Bilirubin: 1.1 mg/dL (ref 0.3–1.2)
Total Protein: 7.8 g/dL (ref 6.5–8.1)

## 2021-03-27 MED ORDER — CLOBETASOL 17 PROPIONATE 0.5 % POWD: 1.0000 "application " | Freq: Two times a day (BID) | 0 refills | Status: DC

## 2021-03-27 MED ORDER — PREDNISONE 20 MG PO TABS
60.0000 mg | ORAL_TABLET | Freq: Once | ORAL | Status: AC
  Administered 2021-03-27: 60 mg via ORAL
  Filled 2021-03-27: qty 3

## 2021-03-27 MED ORDER — SODIUM CHLORIDE 0.9 % IV BOLUS
1000.0000 mL | Freq: Once | INTRAVENOUS | Status: AC
  Administered 2021-03-27: 1000 mL via INTRAVENOUS

## 2021-03-27 MED ORDER — HYDROXYZINE HCL 25 MG PO TABS: 25.0000 mg | ORAL_TABLET | Freq: Four times a day (QID) | ORAL | 0 refills | Status: DC

## 2021-03-27 MED ORDER — IPRATROPIUM-ALBUTEROL 0.5-2.5 (3) MG/3ML IN SOLN
3.0000 mL | Freq: Once | RESPIRATORY_TRACT | Status: AC
  Administered 2021-03-27: 3 mL via RESPIRATORY_TRACT
  Filled 2021-03-27: qty 3

## 2021-03-27 MED ORDER — ALBUTEROL SULFATE HFA 108 (90 BASE) MCG/ACT IN AERS: 2.0000 | INHALATION_SPRAY | Freq: Four times a day (QID) | RESPIRATORY_TRACT | 2 refills | Status: DC | PRN

## 2021-03-27 MED ORDER — DIPHENHYDRAMINE HCL 25 MG PO CAPS
50.0000 mg | ORAL_CAPSULE | Freq: Once | ORAL | Status: AC
  Administered 2021-03-27: 50 mg via ORAL
  Filled 2021-03-27: qty 2

## 2021-03-27 MED ORDER — PREDNISONE 50 MG PO TABS: 50.0000 mg | ORAL_TABLET | Freq: Every day | ORAL | 0 refills | Status: DC

## 2021-03-27 NOTE — ED Provider Triage Note (Signed)
Emergency Medicine Provider Triage Evaluation Note  SACHIN FERENCZ , a 51 y.o. male  was evaluated in triage.  Pt complains of rash.  Review of Systems  Positive: Itchy rash, sob Negative: Fever, headache, cp, abd cramping  Physical Exam  BP 110/71 (BP Location: Right Arm)    Pulse (!) 103    Temp 97.9 F (36.6 C) (Oral)    Resp 18    SpO2 (!) 86%  Gen:   Awake, no distress   Resp:  Normal effort  MSK:   Moves extremities without difficulty  Other:    Medical Decision Making  Medically screening exam initiated at 11:59 AM.  Appropriate orders placed.  [name] P Cybulski was informed that the remainder of the evaluation will be completed by another provider, this initial triage assessment does not replace that evaluation, and the importance of remaining in the ED until their evaluation is complete.  Pt here with itchy rash throughout body x 3 days.  Denies any environmental changes or medication changes.  Does endorse some sob.  No wheezing on exam but O2 sats at 92% on RA.     Fayrene Helper, PA-C 03/27/21 1203

## 2021-03-27 NOTE — ED Triage Notes (Signed)
Pt states he noticed a rash pop up a week ago. Pt states it itches really bad and it is all over.

## 2021-03-27 NOTE — ED Provider Notes (Signed)
[facility] EMERGENCY DEPARTMENT Provider Note   CSN: [id number] Arrival date & time: 03/27/21  1053     History  Chief Complaint  Patient presents with   Rash   [name] Interpreter Used for Duration of Visit  [name] is a 51 y.o. male with PMH sx for severe progressive HIV, hx of TB, hx of bronchiectasis. He is here for ithcy red rash on his body for the last week. On record it appears he has had ongoing rash since 12/15. Has tried a cream that he cannot recall. He reports he thinks this is 2/2 a drug rash but does not recall what drug he was given. Worsening sob over the last few days. Has not used inhaler today. Parsons chest pain. Denies oral lesions or new genital lesions but rash is otherwise over his whole body. On record review it appears he was given triamcinolone 1% cream.   Rash Associated symptoms: shortness of breath       Home Medications Prior to Admission medications   Medication Sig Start Date End Date Taking? Authorizing Provider  Clobetasol Propionate (CLOBETASOL 17 PROPIONATE) 0.5 % POWD 1 application by Does not apply route in the morning and at bedtime. Do not apply to face, armpits, groins. Limit use to 2 weeks maximum in any one area 03/27/21  Yes Manley Fason H, PA-C  hydrOXYzine (ATARAX) 25 MG tablet Take 1 tablet (25 mg total) by mouth every 6 (six) hours. For itching 03/27/21  Yes Yanique Mulvihill H, PA-C  predniSONE (DELTASONE) 50 MG tablet Take 1 tablet (50 mg total) by mouth daily. 03/27/21  Yes Trevone Prestwood H, PA-C  albuterol (VENTOLIN HFA) 108 (90 Base) MCG/ACT inhaler Inhale 2 puffs into the lungs every 6 (six) hours as needed for wheezing or shortness of breath. 03/27/21   Naraly Fritcher H, PA-C  azithromycin (ZITHROMAX) 600 MG tablet Take 2 tablets (1,200 mg total) by mouth every 7 (seven) days. 02/10/21   Ghimire, Werner Lean, MD  benzonatate (TESSALON) 200 MG capsule Take 1 capsule (200 mg total) by mouth 3  (three) times daily as needed for cough. Patient not taking: Reported on 03/13/2021 02/10/21   Maretta Bees, MD  Darunavir-Cobicistat-Emtricitabine-Tenofovir Alafenamide [facility]) 800-150-200-10 MG TABS Take 1 tablet by mouth daily with breakfast. 02/10/21   Ghimire, Werner Lean, MD  dolutegravir (TIVICAY) 50 MG tablet Take 1 tablet (50 mg total) by mouth daily. 02/10/21   Ghimire, Werner Lean, MD  enoxaparin (LOVENOX) 120 MG/0.8ML injection Inject 1 syringe (120 mg total) into the skin daily. 02/10/21   Ghimire, Werner Lean, MD  guaiFENesin (MUCINEX) 600 MG 12 hr tablet Take 1 tablet (600 mg total) by mouth 2 (two) times daily. Patient not taking: Reported on 03/13/2021 02/10/21   Maretta Bees, MD  sulfamethoxazole-trimethoprim (BACTRIM DS) 800-160 MG tablet Take 1 tablet by mouth daily. 02/10/21   Ghimire, Werner Lean, MD  Triamcinolone Acetonide (TRIAMCINOLONE 0.1 % CREAM : EUCERIN) CREA Apply 1 application topically 3 (three) times daily as needed. 02/27/21   Blanchard Kelch, NP      Allergies    Metformin and related    Review of Systems   Review of Systems  Respiratory:  Positive for cough and shortness of breath.   Skin:  Positive for rash.  All other systems reviewed and are negative.  Physical Exam Updated Vital Signs BP 134/86 (BP Location: Right Arm)    Pulse (!) 107    Temp 97.9 F (36.6 C) (  Oral)    Resp 17    SpO2 90%  Physical Exam Vitals and nursing note reviewed.  Constitutional:      General: He is not in acute distress.    Appearance: Normal appearance.  HENT:     Head: Normocephalic and atraumatic.     Comments: I do not appreciate significant facial edema    Mouth/Throat:     Comments: Parsons intraoral lesions noted Eyes:     General:        Right eye: Parsons discharge.        Left eye: Parsons discharge.  Cardiovascular:     Rate and Rhythm: Normal rate and regular rhythm.     Heart sounds: Parsons murmur heard.   Parsons friction rub. Parsons gallop.  Pulmonary:      Effort: Pulmonary effort is normal.     Breath sounds: Wheezing and rhonchi present.     Comments: Increased effort without acute respiratory distress, significant wheezing and ronchi throughout, some consolidation noted on right side consistent with chronic interstitial disease noted on CXR Abdominal:     General: Bowel sounds are normal.     Palpations: Abdomen is soft.     Comments: Colostomy bag in place  Skin:    General: Skin is warm and dry.     Capillary Refill: Capillary refill takes less than 2 seconds.     Comments: Diffuse lacelike red rash on visualized skin surface with secondary dry skin, irritation  Neurological:     Mental Status: He is alert and oriented to person, place, and time.  Psychiatric:        Mood and Affect: Mood normal.        Behavior: Behavior normal.    ED Results / Procedures / Treatments   Labs (all labs ordered are listed, but only abnormal results are displayed) Labs Reviewed  COMPREHENSIVE METABOLIC PANEL - Abnormal; Notable for the following components:      Result Value   Glucose, Bld 106 (*)    Creatinine, Ser 1.72 (*)    Calcium 8.6 (*)    GFR, Estimated 48 (*)    All other components within normal limits  CBC WITH DIFFERENTIAL/PLATELET - Abnormal; Notable for the following components:   Eosinophils Absolute 1.3 (*)    All other components within normal limits    EKG None  Radiology DG Chest 2 View  Result Date: 03/27/2021 CLINICAL DATA:  Shortness of breath, cough, chest pain, symptoms for awhile, history diabetes mellitus, HIV, TB EXAM: CHEST - 2 VIEW COMPARISON:  02/06/2021 FINDINGS: Normal heart size, mediastinal contours, and pulmonary vascularity. Emphysematous changes with asymmetric RIGHT apical scarring. Chronic interstitial infiltrates in LEFT lung unchanged since at least 05/17/2019. Parsons definite superimposed acute infiltrate, pleural effusion, or pneumothorax. Osseous structures unremarkable. 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 Parsons new abnormalities. Electronically Signed   By: Ulyses Southward M.D.   On: 03/27/2021 12:32    Procedures Procedures    Medications Ordered in ED Medications  predniSONE (DELTASONE) tablet 60 mg (60 mg Oral Given 03/27/21 2054)  diphenhydrAMINE (BENADRYL) capsule 50 mg (50 mg Oral Given 03/27/21 2055)  ipratropium-albuterol ([name]) 0.5-2.5 (3) MG/3ML nebulizer solution 3 mL (3 mLs Nebulization Given 03/27/21 2055)  sodium chloride 0.9 % bolus 1,000 mL (1,000 mLs Intravenous New Bag/Given 03/27/21 2330)    ED Course/ Medical Decision Making/ A&P  Medical Decision Making  I discussed this case with my attending physician who cosigned this note including patient's presenting symptoms, physical exam, and planned diagnostics and interventions. Attending physician stated agreement with plan or made changes to plan which were implemented.   Patient with presumed drug reaction vs. Irritant contact dermatitis on exam. Ongoing symptoms since last eval. Parsons systemic steroids given. Significant co-morbidities with HIV, hx TB.  Screening lab work obtained from triage.  I personally reviewed the results which are significant for very mild hyperglycemia of 106.  Patient does have increase in his creatinine to 1.72 from 1.36 9 days ago.  His lab work is otherwise unremarkable.  He does have appearance of presumed drug rash, also appears dry, itchy.  He has Parsons intraoral lesions, I have minimal clinical concern for SJS, TEN.  Patient reports subjective improvement of itching after prednisone, Benadryl.  Physical exam was remarkable for some increased work of breathing, rhonchi, and wheezing specially on the right.  He does have chronic lung disease secondary to history of TB.  His checks x-ray is stable compared to normal.  I agree with radiologist interpretation.  We will administer [name] and reassess.   Patient has significantly improved work of breathing, SPO2 minimally improved. Still some rhonchi throughout.  Desat to 85-86% with walking, however patient appearing overall comfortable.  He feels comfortable going home at this time.  Will discharge with plan as above.  Encouraged close follow-up with his primary care provider.  Continue take HIV medications.  Especially in context of decreased oxygen saturation, history of COPD extensive return precautions were given.  Patient understands and agrees to plan at this time.  Final Clinical Impression(s) / ED Diagnoses Final diagnoses:  Drug eruption  Itching    Rx / DC Orders ED Discharge Orders          Ordered    predniSONE (DELTASONE) 50 MG tablet  Daily        03/27/21 2330    hydrOXYzine (ATARAX) 25 MG tablet  Every 6 hours        03/27/21 2330    Clobetasol Propionate (CLOBETASOL 17 PROPIONATE) 0.5 % POWD  2 times daily        03/27/21 2330    albuterol (VENTOLIN HFA) 108 (90 Base) MCG/ACT inhaler  Every 6 hours PRN        03/27/21 2330              Sonika Levins, Edyth GunnelsChristian H, PA-C 03/27/21 2354    Pollyann SavoySheldon, Charles B, MD 04/01/21 2036

## 2021-03-27 NOTE — Discharge Instructions (Signed)
Kama tulivyojadili tafadhali chukua prednisone kama ilivyoagizwa kwa siku 5 zijazo. Hii itasaidia sana na upele. Pia ninapendekeza utumie mafuta ya clobetasol kwa maeneo yaliyoathirika kwenye mikono yako, miguu, shina hadi mara mbili kwa siku. Epuka matumizi [employer], sehemu za siri, au kwapa. Pia nimekuwekea hydroxyzine ambayo ni dawa Larey Brick hadi mara 3 kwa siku kwa kuwasha. Zaidi ya hayo nilijaza tena inhaler yako, unaweza kuitumia kama inahitajika kwa upungufu wa kupumua. Ninapendekeza ufuatilie na daktari wako wa huduma ya msingi ili [place]. Tafadhali rudi kwa idara ya dharura ikiwa una upungufu wa kupumua unaozidi [place], au huna ufumbuzi wa upele wako licha ya matibabu.

## 2021-03-28 NOTE — ED Notes (Signed)
RN reviewed discharge instructions with pt. Pt verbalized understanding and had no further questions. VSS upon discharge.  

## 2021-04-03 ENCOUNTER — Ambulatory Visit (INDEPENDENT_AMBULATORY_CARE_PROVIDER_SITE_OTHER): Payer: BC Managed Care – PPO | Admitting: Infectious Diseases

## 2021-04-03 ENCOUNTER — Other Ambulatory Visit: Payer: Self-pay

## 2021-04-03 ENCOUNTER — Encounter: Payer: Self-pay | Admitting: Infectious Diseases

## 2021-04-03 VITALS — BP 89/63 | HR 114 | Temp 98.3°F | Wt 178.0 lb

## 2021-04-03 DIAGNOSIS — R220 Localized swelling, mass and lump, head: Secondary | ICD-10-CM | POA: Insufficient documentation

## 2021-04-03 DIAGNOSIS — L299 Pruritus, unspecified: Secondary | ICD-10-CM | POA: Diagnosis not present

## 2021-04-03 DIAGNOSIS — L308 Other specified dermatitis: Secondary | ICD-10-CM | POA: Insufficient documentation

## 2021-04-03 MED ORDER — FLUCONAZOLE 150 MG PO TABS: 300.0000 mg | ORAL_TABLET | ORAL | 0 refills | Status: DC

## 2021-04-03 MED ORDER — HYDROXYZINE HCL 25 MG PO TABS: 25.0000 mg | ORAL_TABLET | Freq: Four times a day (QID) | ORAL | 0 refills | Status: DC

## 2021-04-03 MED ORDER — DIPHENHYDRAMINE HCL 50 MG/ML IJ SOLN: 50.0000 mg | Freq: Once | INTRAMUSCULAR | Status: DC

## 2021-04-03 MED ORDER — DIPHENHYDRAMINE HCL 50 MG/ML IJ SOLN
50.0000 mg | Freq: Once | INTRAMUSCULAR | Status: AC
  Administered 2021-04-03: 50 mg via INTRAMUSCULAR

## 2021-04-03 MED ORDER — FAMOTIDINE 40 MG PO TABS: 40.0000 mg | ORAL_TABLET | Freq: Every day | ORAL | 0 refills | Status: DC

## 2021-04-03 NOTE — Assessment & Plan Note (Addendum)
Problem now off and on that has relapsed despite antihistamines and prednisone courses. No angioedema of the lips/tongue. No stridor. No signs of secondary bacterial infection of the conjunctiva or upper/lower lid. No vision changes or pain with EOM.   It is not clear what is triggering these suddenly for him. 50 mg IM benadryl today. Will add H2 blocker daily and refill hydroxyzine PRN.  Referral to allergy team urgently for assistance with treatment.

## 2021-04-03 NOTE — Progress Notes (Addendum)
Established Patient Office Visit  Subjective:  Patient ID: [name], male    DOB: 08/16/1970  Age: 51 y.o. MRN: [id number]  CC:  Chief Complaint  Patient presents with   Follow-up    Facial itching     HPI [name] presents for acute visit for evaluation of itching and swelling of the eyes.   He was seen in December by myself for diffuse pruritic rash - thought to be related to metformin (was taking metformin and janumet at the time). These meds were stopped and he was given 50mg  injection of benadryl IM. This helped at first but the condition returned.  FU with Tammy SoursGreg 10 days later he seemed to be improving and was only using topical triamcinolone cream to local areas of itching. Swelling of the hands and feet had improved. Was still having mild itching at that time.   Went to ED on 03/27/2021 for ongoing and worsened rash of the skin - also reported worsening shortness of breath at that time. Reported "whole body rash" at that time to ER team. Has had a worsening of creatinine now over the last few months with previous baseline 1.24. He has had periodic elevations in the past that had resolved to normal.  He states his breathing is fine and has a slightly productive cough at times that responds well with the inhaler.   He says that his eyes have been swelling off and on ever since he came back  He denies any new hygiene products, perfumes, detergents, foods. He had some new medicines given to him form the ER that he completed but did not find helpful. Was not able to pick up the Clobetasol given $700 price tag. He states that the eye swelling has been present since December. He says he is not taking Bactrim, not taking azithromycin, not taking any of the metformin. He has continued the [place] and [place] once a day together and has been on these for several years. His hands and feet are very itchy along with upper chest/back.    Past Medical History:  Diagnosis Date    Depression    "stress and depression for any man is common" (03/21/2014)   Diabetes mellitus without complication ([name])    Dyspnea    Genital warts 01/04/2017   Hepatitis    "I don't know what hepatitis I have"   HIV disease ([name])    TB (pulmonary tuberculosis)    previously treated according to refugee documentation    Past Surgical History:  Procedure Laterality Date   FRACTURE SURGERY     IM NAILING TIBIA Right 03/21/2014   ORIF ANKLE FRACTURE Right 03/21/2014   lateral malleolus/notes 03/21/2014   ORIF ANKLE FRACTURE Right 03/21/2014   Procedure: OPEN REDUCTION INTERNAL FIXATION (ORIF) pilon ;  Surgeon: Eldred MangesMark C Yates, MD;  Location: [facility] OR;  Service: Orthopedics;  Laterality: Right;   TEE WITHOUT CARDIOVERSION N/A 05/23/2019   Procedure: TRANSESOPHAGEAL ECHOCARDIOGRAM (TEE);  Surgeon: Chilton Siandolph, Tiffany, MD;  [facility];  Service: Cardiovascular;  Laterality: N/A;   TIBIA IM NAIL INSERTION Right 03/21/2014   Procedure: INTRAMEDULLARY (IM) NAIL TIBIAL;  Surgeon: Eldred MangesMark C Yates, MD;  Location: [facility] OR;  Service: Orthopedics;  Laterality: Right;   VIDEO BRONCHOSCOPY Bilateral 03/02/2016   Procedure: VIDEO BRONCHOSCOPY WITHOUT FLUORO;  Surgeon: Roslynn AmbleJennings E Nestor, MD;  [facility];  Service: Cardiopulmonary;  Laterality: Bilateral;    Family History  Problem Relation Age of Onset   Hypertension Other  Heart disease Sister     Social History   Socioeconomic History   Marital status: Single    Spouse name: Not on file   Number of children: Not on file   Years of education: Not on file   Highest education level: Not on file  Occupational History   Not on file  Tobacco Use   Smoking status: Former    Packs/day: 0.50    Years: 6.00    Pack years: 3.00    Types: Cigarettes    Quit date: 03/16/2001    Years since quitting: 20.0   Smokeless tobacco: Never   Tobacco comments:    "quit smoking ~ 2003"  Vaping Use   Vaping Use: Never used  Substance and Sexual Activity    Alcohol use: Yes    Comment: drinks bottled beer intermittently   Drug use: No   Sexual activity: Not Currently    Partners: Female  Other Topics Concern   Not on file  Social History Narrative   As of 09/18/2013:   -Arrived in [place]: September 05, 2013    -Refugee from [facility], spent 4 years in refugee camp in [place].    -Language: [name], requires Architect (speaks essentially no [place])   -Education: no formal education, previously worked as a Product/process development scientist: family phone number [phone], caseworker is Lannette Donath Dar Bi [phone] (with [employer])   -lives with daughter and three sons   -denies alcohol, drug, tobacco abuse      [facility] (02/28/16):   Patient now admitted to drinking bottled beer. Reports continued tobacco abstinence. Denies any bird or mold exposure. No pets currently. No recent travel.   Social Determinants of Health   Financial Resource Strain: Not on file  Food Insecurity: Not on file  Transportation Needs: Not on file  Physical Activity: Not on file  Stress: Not on file  Social Connections: Not on file  Intimate Partner Violence: Not on file    Outpatient Medications Prior to Visit  Medication Sig Dispense Refill   albuterol (VENTOLIN HFA) 108 (90 Base) MCG/ACT inhaler Inhale 2 puffs into the lungs every 6 (six) hours as needed for wheezing or shortness of breath. 18 g 2   Darunavir-Cobicistat-Emtricitabine-Tenofovir Alafenamide (SYMTUZA) 800-150-200-10 MG TABS Take 1 tablet by mouth daily with breakfast. 30 tablet 5   dolutegravir (TIVICAY) 50 MG tablet Take 1 tablet (50 mg total) by mouth daily. 30 tablet 5   enoxaparin (LOVENOX) 120 MG/0.8ML injection Inject 1 syringe (120 mg total) into the skin daily. 30 mL 3   azithromycin (ZITHROMAX) 600 MG tablet Take 2 tablets (1,200 mg total) by mouth every 7 (seven) days. (Patient not taking: Reported on 04/03/2021) 28 tablet 2   benzonatate (TESSALON) 200 MG capsule Take 1 capsule (200  mg total) by mouth 3 (three) times daily as needed for cough. (Patient not taking: Reported on 03/13/2021) 20 capsule 0   Clobetasol Propionate (CLOBETASOL 17 PROPIONATE) 0.5 % POWD 1 application by Does not apply route in the morning and at bedtime. Do not apply to face, armpits, groins. Limit use to 2 weeks maximum in any one area (Patient not taking: Reported on 04/03/2021) 25 g 0   guaiFENesin (MUCINEX) 600 MG 12 hr tablet Take 1 tablet (600 mg total) by mouth 2 (two) times daily. (Patient not taking: Reported on 03/13/2021) 30 tablet 0   predniSONE (DELTASONE) 50 MG tablet Take 1 tablet (50 mg total) by mouth daily. (Patient not taking:  Reported on 04/03/2021) 5 tablet 0   sulfamethoxazole-trimethoprim (BACTRIM DS) 800-160 MG tablet Take 1 tablet by mouth daily. (Patient not taking: Reported on 04/03/2021) 30 tablet 3   Triamcinolone Acetonide (TRIAMCINOLONE 0.1 % CREAM : EUCERIN) CREA Apply 1 application topically 3 (three) times daily as needed. (Patient not taking: Reported on 04/03/2021) 1 each 2   hydrOXYzine (ATARAX) 25 MG tablet Take 1 tablet (25 mg total) by mouth every 6 (six) hours. For itching (Patient not taking: Reported on 04/03/2021) 24 tablet 0   No facility-administered medications prior to visit.    Allergies  Allergen Reactions   Metformin And Related Swelling and Rash    ROS Review of Systems  Constitutional:  Negative for chills and fever.  HENT:  Negative for sore throat.   Eyes:  Positive for itching. Negative for pain and discharge.       Upper and lower eye lid swelling   Respiratory:  Positive for cough (occasional). Negative for shortness of breath and wheezing.   Cardiovascular:  Negative for chest pain and leg swelling.  Gastrointestinal:  Negative for abdominal pain, diarrhea and vomiting.  Genitourinary:  Negative for dysuria and flank pain.  Musculoskeletal:  Negative for myalgias and neck pain.  Skin:  Positive for color change and rash.  Neurological:   Negative for dizziness and headaches.  Psychiatric/Behavioral:  The patient is not nervous/anxious.      Objective:    Physical Exam Vitals reviewed.  Constitutional:      Appearance: Normal appearance.  HENT:     Mouth/Throat:     Mouth: Mucous membranes are moist.  Eyes:     Extraocular Movements: Extraocular movements intact.     Conjunctiva/sclera: Conjunctivae normal.     Comments: Upper and lower lid swelling noted as pictured.   Cardiovascular:     Rate and Rhythm: Regular rhythm. Tachycardia present.  Skin:    General: Skin is warm and dry.     Capillary Refill: Capillary refill takes less than 2 seconds.     Findings: Rash present.  Neurological:     Mental Status: He is alert and oriented to person, place, and time.    BP (!) 89/63    Pulse (!) 114    Temp 98.3 F (36.8 C) (Oral)    Wt 178 lb (80.7 kg)    SpO2 91%    BMI 23.48 kg/m  Wt Readings from Last 3 Encounters:  04/03/21 178 lb (80.7 kg)  03/13/21 175 lb (79.4 kg)  02/27/21 186 lb (84.4 kg)        Health Maintenance Due  Topic Date Due   FOOT EXAM  Never done   OPHTHALMOLOGY EXAM  Never done   URINE MICROALBUMIN  Never done   TETANUS/TDAP  Never done   Zoster Vaccines- Shingrix (1 of 2) Never done   COVID-19 Vaccine (2 - Pfizer risk series) 01/26/2020   INFLUENZA VACCINE  10/14/2020    There are no preventive care reminders to display for this patient.  Lab Results  Component Value Date   TSH 1.216 11/11/2018   Lab Results  Component Value Date   WBC 6.0 03/27/2021   HGB 15.0 03/27/2021   HCT 44.3 03/27/2021   MCV 99.8 03/27/2021   PLT 183 03/27/2021   Lab Results  Component Value Date   NA 135 03/27/2021   K 3.6 03/27/2021   CO2 27 03/27/2021   GLUCOSE 106 (H) 03/27/2021   BUN 19 03/27/2021   CREATININE 1.72 (  H) 03/27/2021   BILITOT 1.1 03/27/2021   ALKPHOS 62 03/27/2021   AST 21 03/27/2021   ALT 15 03/27/2021   PROT 7.8 03/27/2021   ALBUMIN 3.6 03/27/2021   CALCIUM 8.6  (L) 03/27/2021   ANIONGAP 10 03/27/2021   Lab Results  Component Value Date   CHOL 155 08/22/2019   Lab Results  Component Value Date   HDL 65 08/22/2019   Lab Results  Component Value Date   LDLCALC 78 08/22/2019   Lab Results  Component Value Date   TRIG 50 08/22/2019   Lab Results  Component Value Date   CHOLHDL 2.4 08/22/2019   Lab Results  Component Value Date   HGBA1C 6.0 (H) 11/01/2020      Assessment & Plan:   Problem List Items Addressed This Visit       Unprioritized   Pruritic dermatitis - Primary    Unresolved and persistent for 6 weeks or so now with moderate improvement for short periods with antihistamines and steroids. He stopped all other medications but continues to have relapsing symptoms. No urticaria noted. He has chronically dry skin and thickened/scaled skin to the back of his hands and chest/neck and antecubital fossa. Obviously miserable and scratching throughout the visit.   He has some flat hyperpigmented macular spots along the chest back.   ?tinea versicolor, though itching seems pretty extreme for that and eye swelling seems abnormal. Peripheral eosinophilia noted recently but not present in the immediate past. Given pulmonary complaints in ER the other day will check strongyloidiasis Ab. No Larva currens or urticaria though he completed course of steroids recently.  He has not had much relief from the other topical options, could not afford the clobetasol and found triamcinolone unhelpful. Recommended liberal use of Eucerin:vaseline to keep skin hydrated. Will trial weekly dose of diflucan over the next month until he sees Dr. Drue SecondSnider again for follow up to trial for consideration of tinea versicolor skin infection. Topical selsun blue.   Will refer to dermatology and allergy team with possible recurrent allergic reactions.       Relevant Orders   Ambulatory referral to Allergy   Ambulatory referral to Dermatology   Facial swelling     Problem now off and on that has relapsed despite antihistamines and prednisone courses. No angioedema of the lips/tongue. No stridor. No signs of secondary bacterial infection of the conjunctiva or upper/lower lid. No vision changes or pain with EOM.   It is not clear what is triggering these suddenly for him. 50 mg IM benadryl today. Will add H2 blocker daily and refill hydroxyzine PRN.  Referral to allergy team urgently for assistance with treatment.       Relevant Orders   Ambulatory referral to Allergy    Meds ordered this encounter  Medications   famotidine (PEPCID) 40 MG tablet    Sig: Take 1 tablet (40 mg total) by mouth daily.    Dispense:  30 tablet    Refill:  0    Order Specific Question:   Supervising Provider    Answer:   Gardiner BarefootCOMER, ROBERT W [[id number]]   hydrOXYzine (ATARAX) 25 MG tablet    Sig: Take 1 tablet (25 mg total) by mouth every 6 (six) hours for 20 days. For itching    Dispense:  60 tablet    Refill:  0    Order Specific Question:   Supervising Provider    Answer:   Gardiner BarefootCOMER, ROBERT W [[id number]]   fluconazole (DIFLUCAN) 150 MG  tablet    Sig: Take 2 tablets (300 mg total) by mouth once a week for 6 doses.    Dispense:  12 tablet    Refill:  0    Order Specific Question:   Supervising Provider    Answer:   Gardiner Barefoot [[id number]]   DISCONTD: diphenhydrAMINE (BENADRYL) injection 50 mg   diphenhydrAMINE (BENADRYL) injection 50 mg    Follow-up: as scheduled with Dr. Drue Second.    Rexene Alberts, NP

## 2021-04-03 NOTE — Assessment & Plan Note (Addendum)
Unresolved and persistent for 6 weeks or so now with moderate improvement for short periods with antihistamines and steroids. He stopped all other medications but continues to have relapsing symptoms. No urticaria noted. He has chronically dry skin and thickened/scaled skin to the back of his hands and chest/neck and antecubital fossa. Obviously miserable and scratching throughout the visit.   He has some flat hyperpigmented macular spots along the chest back.   ?tinea versicolor, though itching seems pretty extreme for that and eye swelling seems abnormal. Peripheral eosinophilia noted recently but not present in the immediate past. Given pulmonary complaints in ER the other day will check strongyloidiasis Ab. No Larva currensor urticaria though he completed course of steroids recently.  He has not had much relief from the other topical options, could not afford the clobetasol and found triamcinolone unhelpful. Recommended liberal use of Eucerin:vaseline to keep skin hydrated. Will trial weekly dose of diflucan over the next month until he sees Dr. Baxter Flattery again for follow up to trial for consideration of tinea versicolor skin infection. Topical selsun blue.   Will refer to dermatology and allergy team with possible recurrent allergic reactions.

## 2021-04-03 NOTE — Patient Instructions (Addendum)
START taking Pepcid once a day  START taking hydroxyzine 3 times a day for itching   START taking Diflucan TWO tablets once a week until you see Dr. Drue Second again    Over the counter Eucerine cream and vasaline for your skin to help with the dryness and itching. Multiple times a day.   Will refer you to allergy and dermatology (Skin doctor) to help figure out what is causing your problem.   Stop by the lab on your way out.

## 2021-04-03 NOTE — Addendum Note (Signed)
Addended by: Harley Alto on: 04/03/2021 03:31 PM   Modules accepted: Orders

## 2021-04-14 LAB — ARUP MISC ORDER
Miscellaneous Test Results: 0.2
NORMAL RANGE:: <=0.9
PRICE:: 90

## 2021-04-16 ENCOUNTER — Emergency Department (HOSPITAL_COMMUNITY): Payer: BC Managed Care – PPO

## 2021-04-16 ENCOUNTER — Other Ambulatory Visit: Payer: Self-pay

## 2021-04-16 ENCOUNTER — Inpatient Hospital Stay (HOSPITAL_COMMUNITY)
Admission: EM | Admit: 2021-04-16 | Discharge: 2021-04-24 | DRG: 974 | Disposition: A | Discharge status: Home / Self Care | Payer: BC Managed Care – PPO | Attending: Internal Medicine | Admitting: Internal Medicine

## 2021-04-16 ENCOUNTER — Encounter (HOSPITAL_COMMUNITY): Payer: Self-pay | Admitting: Emergency Medicine

## 2021-04-16 DIAGNOSIS — Z86711 Personal history of pulmonary embolism: Secondary | ICD-10-CM | POA: Diagnosis present

## 2021-04-16 DIAGNOSIS — E1122 Type 2 diabetes mellitus with diabetic chronic kidney disease: Secondary | ICD-10-CM | POA: Diagnosis present

## 2021-04-16 DIAGNOSIS — E875 Hyperkalemia: Secondary | ICD-10-CM | POA: Diagnosis present

## 2021-04-16 DIAGNOSIS — E1169 Type 2 diabetes mellitus with other specified complication: Secondary | ICD-10-CM

## 2021-04-16 DIAGNOSIS — L209 Atopic dermatitis, unspecified: Secondary | ICD-10-CM | POA: Diagnosis present

## 2021-04-16 DIAGNOSIS — J189 Pneumonia, unspecified organism: Principal | ICD-10-CM | POA: Diagnosis present

## 2021-04-16 DIAGNOSIS — Z8619 Personal history of other infectious and parasitic diseases: Secondary | ICD-10-CM | POA: Diagnosis not present

## 2021-04-16 DIAGNOSIS — I5022 Chronic systolic (congestive) heart failure: Secondary | ICD-10-CM | POA: Diagnosis present

## 2021-04-16 DIAGNOSIS — Z87891 Personal history of nicotine dependence: Secondary | ICD-10-CM

## 2021-04-16 DIAGNOSIS — N182 Chronic kidney disease, stage 2 (mild): Secondary | ICD-10-CM | POA: Diagnosis present

## 2021-04-16 DIAGNOSIS — L308 Other specified dermatitis: Secondary | ICD-10-CM | POA: Diagnosis present

## 2021-04-16 DIAGNOSIS — E119 Type 2 diabetes mellitus without complications: Secondary | ICD-10-CM

## 2021-04-16 DIAGNOSIS — J9601 Acute respiratory failure with hypoxia: Secondary | ICD-10-CM | POA: Diagnosis present

## 2021-04-16 DIAGNOSIS — J470 Bronchiectasis with acute lower respiratory infection: Secondary | ICD-10-CM | POA: Diagnosis present

## 2021-04-16 DIAGNOSIS — E2740 Unspecified adrenocortical insufficiency: Secondary | ICD-10-CM | POA: Diagnosis present

## 2021-04-16 DIAGNOSIS — Z86718 Personal history of other venous thrombosis and embolism: Secondary | ICD-10-CM

## 2021-04-16 DIAGNOSIS — Z1624 Resistance to multiple antibiotics: Secondary | ICD-10-CM | POA: Diagnosis present

## 2021-04-16 DIAGNOSIS — L299 Pruritus, unspecified: Secondary | ICD-10-CM

## 2021-04-16 DIAGNOSIS — B20 Human immunodeficiency virus [HIV] disease: Secondary | ICD-10-CM | POA: Diagnosis present

## 2021-04-16 DIAGNOSIS — Z8249 Family history of ischemic heart disease and other diseases of the circulatory system: Secondary | ICD-10-CM

## 2021-04-16 DIAGNOSIS — Z9981 Dependence on supplemental oxygen: Secondary | ICD-10-CM

## 2021-04-16 DIAGNOSIS — Z933 Colostomy status: Secondary | ICD-10-CM

## 2021-04-16 DIAGNOSIS — Z79899 Other long term (current) drug therapy: Secondary | ICD-10-CM

## 2021-04-16 DIAGNOSIS — Z20822 Contact with and (suspected) exposure to covid-19: Secondary | ICD-10-CM | POA: Diagnosis present

## 2021-04-16 DIAGNOSIS — Z8611 Personal history of tuberculosis: Secondary | ICD-10-CM

## 2021-04-16 DIAGNOSIS — N1832 Chronic kidney disease, stage 3b: Secondary | ICD-10-CM | POA: Diagnosis present

## 2021-04-16 DIAGNOSIS — I272 Pulmonary hypertension, unspecified: Secondary | ICD-10-CM | POA: Diagnosis present

## 2021-04-16 DIAGNOSIS — J479 Bronchiectasis, uncomplicated: Secondary | ICD-10-CM

## 2021-04-16 DIAGNOSIS — A150 Tuberculosis of lung: Secondary | ICD-10-CM

## 2021-04-16 HISTORY — DX: Bronchiectasis with (acute) exacerbation: J47.1

## 2021-04-16 HISTORY — DX: Methicillin susceptible Staphylococcus aureus infection as the cause of diseases classified elsewhere: B95.61

## 2021-04-16 HISTORY — DX: Bacteremia: R78.81

## 2021-04-16 HISTORY — DX: Pneumocystosis: B59

## 2021-04-16 LAB — CBC WITH DIFFERENTIAL/PLATELET
Abs Immature Granulocytes: 0.03 10*3/uL (ref 0.00–0.07)
Basophils Absolute: 0 10*3/uL (ref 0.0–0.1)
Basophils Relative: 0 %
Eosinophils Absolute: 0.3 10*3/uL (ref 0.0–0.5)
Eosinophils Relative: 4 %
HCT: 39.1 % (ref 39.0–52.0)
Hemoglobin: 13.1 g/dL (ref 13.0–17.0)
Immature Granulocytes: 0 %
Lymphocytes Relative: 16 %
Lymphs Abs: 1.3 10*3/uL (ref 0.7–4.0)
MCH: 33.9 pg (ref 26.0–34.0)
MCHC: 33.5 g/dL (ref 30.0–36.0)
MCV: 101 fL — ABNORMAL HIGH (ref 80.0–100.0)
Monocytes Absolute: 1.3 10*3/uL — ABNORMAL HIGH (ref 0.1–1.0)
Monocytes Relative: 15 %
Neutro Abs: 5.4 10*3/uL (ref 1.7–7.7)
Neutrophils Relative %: 65 %
Platelets: 270 10*3/uL (ref 150–400)
RBC: 3.87 MIL/uL — ABNORMAL LOW (ref 4.22–5.81)
RDW: 12.4 % (ref 11.5–15.5)
WBC: 8.4 10*3/uL (ref 4.0–10.5)
nRBC: 0 % (ref 0.0–0.2)

## 2021-04-16 LAB — I-STAT CHEM 8, ED
BUN: 10 mg/dL (ref 6–20)
Calcium, Ion: 1.01 mmol/L — ABNORMAL LOW (ref 1.15–1.40)
Chloride: 99 mmol/L (ref 98–111)
Creatinine, Ser: 1.4 mg/dL — ABNORMAL HIGH (ref 0.61–1.24)
Glucose, Bld: 125 mg/dL — ABNORMAL HIGH (ref 70–99)
HCT: 39 % (ref 39.0–52.0)
Hemoglobin: 13.3 g/dL (ref 13.0–17.0)
Potassium: 3.6 mmol/L (ref 3.5–5.1)
Sodium: 137 mmol/L (ref 135–145)
TCO2: 30 mmol/L (ref 22–32)

## 2021-04-16 LAB — RESP PANEL BY RT-PCR (FLU A&B, COVID) ARPGX2
Influenza A by PCR: NEGATIVE
Influenza B by PCR: NEGATIVE
SARS Coronavirus 2 by RT PCR: NEGATIVE

## 2021-04-16 LAB — COMPREHENSIVE METABOLIC PANEL
ALT: 9 U/L (ref 0–44)
AST: 13 U/L — ABNORMAL LOW (ref 15–41)
Albumin: 2.7 g/dL — ABNORMAL LOW (ref 3.5–5.0)
Alkaline Phosphatase: 56 U/L (ref 38–126)
Anion gap: 10 (ref 5–15)
BUN: 9 mg/dL (ref 6–20)
CO2: 29 mmol/L (ref 22–32)
Calcium: 9.2 mg/dL (ref 8.9–10.3)
Chloride: 98 mmol/L (ref 98–111)
Creatinine, Ser: 1.46 mg/dL — ABNORMAL HIGH (ref 0.61–1.24)
GFR, Estimated: 58 mL/min — ABNORMAL LOW (ref >60)
Glucose, Bld: 127 mg/dL — ABNORMAL HIGH (ref 70–99)
Potassium: 3.6 mmol/L (ref 3.5–5.1)
Sodium: 137 mmol/L (ref 135–145)
Total Bilirubin: 0.9 mg/dL (ref 0.3–1.2)
Total Protein: 8 g/dL (ref 6.5–8.1)

## 2021-04-16 LAB — APTT: aPTT: 34 s (ref 24–36)

## 2021-04-16 LAB — LACTIC ACID, PLASMA: Lactic Acid, Venous: 0.9 mmol/L (ref 0.5–1.9)

## 2021-04-16 LAB — PROTIME-INR
INR: 1.2 (ref 0.8–1.2)
Prothrombin Time: 14.8 seconds (ref 11.4–15.2)

## 2021-04-16 MED ORDER — DARUN-COBIC-EMTRICIT-TENOFAF 800-150-200-10 MG PO TABS
1.0000 | ORAL_TABLET | Freq: Every day | ORAL | Status: DC
  Administered 2021-04-17 – 2021-04-24 (×8): 1 via ORAL
  Filled 2021-04-16 (×11): qty 1

## 2021-04-16 MED ORDER — HYDROXYZINE HCL 25 MG PO TABS
25.0000 mg | ORAL_TABLET | Freq: Four times a day (QID) | ORAL | Status: DC | PRN
  Administered 2021-04-17 – 2021-04-19 (×3): 25 mg via ORAL
  Filled 2021-04-16 (×3): qty 1

## 2021-04-16 MED ORDER — LACTATED RINGERS IV BOLUS
1000.0000 mL | Freq: Once | INTRAVENOUS | Status: AC
  Administered 2021-04-16: 1000 mL via INTRAVENOUS

## 2021-04-16 MED ORDER — SODIUM CHLORIDE 0.9 % IV SOLN
2.0000 g | INTRAVENOUS | Status: AC
  Administered 2021-04-16 – 2021-04-21 (×5): 2 g via INTRAVENOUS
  Filled 2021-04-16 (×5): qty 20

## 2021-04-16 MED ORDER — SULFAMETHOXAZOLE-TRIMETHOPRIM 800-160 MG PO TABS: 1.0000 | ORAL_TABLET | Freq: Every day | ORAL | Status: DC

## 2021-04-16 MED ORDER — ACETAMINOPHEN 325 MG PO TABS
650.0000 mg | ORAL_TABLET | Freq: Four times a day (QID) | ORAL | Status: DC | PRN
  Administered 2021-04-17 – 2021-04-20 (×7): 650 mg via ORAL
  Filled 2021-04-16 (×7): qty 2

## 2021-04-16 MED ORDER — DOLUTEGRAVIR SODIUM 50 MG PO TABS
50.0000 mg | ORAL_TABLET | Freq: Every day | ORAL | Status: DC
  Administered 2021-04-17 – 2021-04-24 (×8): 50 mg via ORAL
  Filled 2021-04-16 (×8): qty 1

## 2021-04-16 MED ORDER — ONDANSETRON HCL 4 MG/2ML IJ SOLN
4.0000 mg | Freq: Four times a day (QID) | INTRAMUSCULAR | Status: DC | PRN
  Administered 2021-04-17: 4 mg via INTRAVENOUS
  Filled 2021-04-16: qty 2

## 2021-04-16 MED ORDER — LACTATED RINGERS IV SOLN: INTRAVENOUS | Status: AC

## 2021-04-16 MED ORDER — SODIUM CHLORIDE 0.9 % IV BOLUS: 1000.0000 mL | Freq: Once | INTRAVENOUS | Status: DC

## 2021-04-16 MED ORDER — GUAIFENESIN-DM 100-10 MG/5ML PO SYRP
15.0000 mL | ORAL_SOLUTION | ORAL | Status: DC | PRN
  Administered 2021-04-17 – 2021-04-19 (×5): 15 mL via ORAL
  Filled 2021-04-16 (×2): qty 20
  Filled 2021-04-16: qty 15
  Filled 2021-04-16 (×2): qty 20

## 2021-04-16 MED ORDER — SODIUM CHLORIDE 0.9 % IV SOLN
500.0000 mg | INTRAVENOUS | Status: DC
  Administered 2021-04-16: 500 mg via INTRAVENOUS
  Filled 2021-04-16 (×2): qty 5

## 2021-04-16 MED ORDER — ALBUTEROL SULFATE (2.5 MG/3ML) 0.083% IN NEBU
2.5000 mg | INHALATION_SOLUTION | RESPIRATORY_TRACT | Status: DC | PRN
  Administered 2021-04-18: 2.5 mg via RESPIRATORY_TRACT
  Filled 2021-04-16: qty 3

## 2021-04-16 MED ORDER — HEPARIN SODIUM (PORCINE) 5000 UNIT/ML IJ SOLN
5000.0000 [IU] | Freq: Three times a day (TID) | INTRAMUSCULAR | Status: DC
  Administered 2021-04-17 – 2021-04-24 (×22): 5000 [IU] via SUBCUTANEOUS
  Filled 2021-04-16 (×21): qty 1

## 2021-04-16 MED ORDER — ACETAMINOPHEN 650 MG RE SUPP: 650.0000 mg | Freq: Four times a day (QID) | RECTAL | Status: DC | PRN

## 2021-04-16 MED ORDER — DIPHENHYDRAMINE HCL 25 MG PO CAPS
50.0000 mg | ORAL_CAPSULE | Freq: Once | ORAL | Status: AC
Start: 2021-04-16 — End: 2021-04-16
  Administered 2021-04-16: 50 mg via ORAL
  Filled 2021-04-16: qty 2

## 2021-04-16 MED ORDER — DIPHENHYDRAMINE HCL 50 MG/ML IJ SOLN
25.0000 mg | Freq: Once | INTRAMUSCULAR | Status: AC
  Administered 2021-04-17: 25 mg via INTRAVENOUS
  Filled 2021-04-16: qty 1

## 2021-04-16 MED ORDER — ACETAMINOPHEN 325 MG PO TABS
650.0000 mg | ORAL_TABLET | ORAL | Status: AC
  Administered 2021-04-16: 650 mg via ORAL
  Filled 2021-04-16: qty 2

## 2021-04-16 MED ORDER — ONDANSETRON HCL 4 MG PO TABS: 4.0000 mg | ORAL_TABLET | Freq: Four times a day (QID) | ORAL | Status: DC | PRN

## 2021-04-16 MED ORDER — LACTATED RINGERS IV BOLUS: 1000.0000 mL | Freq: Once | INTRAVENOUS | Status: DC

## 2021-04-16 NOTE — ED Triage Notes (Signed)
Patient coming from home complaint of cough and headache and rash x3 weeks.

## 2021-04-16 NOTE — Assessment & Plan Note (Signed)
IV benadryl x 1. Prn atarax

## 2021-04-16 NOTE — Assessment & Plan Note (Signed)
Admit to observation medical bed.  IV Rocephin and p.o. Zithromax.  Check Legionella and strep pneumo titers.

## 2021-04-16 NOTE — ED Notes (Signed)
Pt's colostomy site leaking, replacement bag and supplies ordered.

## 2021-04-16 NOTE — Assessment & Plan Note (Signed)
Continue supplemental oxygen. 

## 2021-04-16 NOTE — Assessment & Plan Note (Signed)
Chronic. 

## 2021-04-16 NOTE — ED Provider Notes (Signed)
[facility] Provider [facility]   CSN: [id number] Arrival date & time: 04/16/21  1644     History  Chief Complaint  Patient presents with   Cough   Headache   Shortness of Breath    [name] is a 51 y.o. male.   Cough Associated symptoms: headaches and shortness of breath   Headache Associated symptoms: cough   Shortness of Breath Associated symptoms: cough and headaches    Patient states that he was feeling and feeling increasingly sick over the past 1 month he describes sick as headache body aches cough headache and states he has been coughing more over the past 3 weeks and then over the past 2 days he has become much more ill.  He denies fevers at home but states that he is coughing up more sputum.  Denies any chest pain but states that his chest feels tight  Denies fevers.  No other significant associated symptoms.    Home Medications Prior to Admission medications   Medication Sig Start Date End Date Taking? Authorizing Provider  albuterol (VENTOLIN HFA) 108 (90 Base) MCG/ACT inhaler Inhale 2 puffs into the lungs every 6 (six) hours as needed for wheezing or shortness of breath. 03/27/21   Prosperi, Christian H, PA-C  azithromycin (ZITHROMAX) 600 MG tablet Take 2 tablets (1,200 mg total) by mouth every 7 (seven) days. Patient not taking: Reported on 04/03/2021 02/10/21   Maretta BeesGhimire, Shanker M, MD  benzonatate (TESSALON) 200 MG capsule Take 1 capsule (200 mg total) by mouth 3 (three) times daily as needed for cough. Patient not taking: Reported on 03/13/2021 02/10/21   Maretta BeesGhimire, Shanker M, MD  Clobetasol Propionate (CLOBETASOL 17 PROPIONATE) 0.5 % POWD 1 application by Does not apply route in the morning and at bedtime. Do not apply to face, armpits, groins. Limit use to 2 weeks maximum in any one area Patient not taking: Reported on 04/03/2021 03/27/21   Prosperi, Christian H, PA-C  Darunavir-Cobicistat-Emtricitabine-Tenofovir  Alafenamide (SYMTUZA) 800-150-200-10 MG TABS Take 1 tablet by mouth daily with breakfast. 02/10/21   Ghimire, Werner LeanShanker M, MD  dolutegravir (TIVICAY) 50 MG tablet Take 1 tablet (50 mg total) by mouth daily. 02/10/21   Ghimire, Werner LeanShanker M, MD  enoxaparin (LOVENOX) 120 MG/0.8ML injection Inject 1 syringe (120 mg total) into the skin daily. 02/10/21   Ghimire, Werner LeanShanker M, MD  famotidine (PEPCID) 40 MG tablet Take 1 tablet (40 mg total) by mouth daily. 04/03/21   Blanchard Kelchixon, Stephanie N, NP  fluconazole (DIFLUCAN) 150 MG tablet Take 2 tablets (300 mg total) by mouth once a week for 6 doses. 04/03/21 05/09/21  Blanchard Kelchixon, Stephanie N, NP  guaiFENesin (MUCINEX) 600 MG 12 hr tablet Take 1 tablet (600 mg total) by mouth 2 (two) times daily. Patient not taking: Reported on 03/13/2021 02/10/21   Maretta BeesGhimire, Shanker M, MD  hydrOXYzine (ATARAX) 25 MG tablet Take 1 tablet (25 mg total) by mouth every 6 (six) hours for 20 days. For itching 04/03/21 04/23/21  Blanchard Kelchixon, Stephanie N, NP  predniSONE (DELTASONE) 50 MG tablet Take 1 tablet (50 mg total) by mouth daily. Patient not taking: Reported on 04/03/2021 03/27/21   Prosperi, Christian H, PA-C  sulfamethoxazole-trimethoprim (BACTRIM DS) 800-160 MG tablet Take 1 tablet by mouth daily. Patient not taking: Reported on 04/03/2021 02/10/21   Maretta BeesGhimire, Shanker M, MD  Triamcinolone Acetonide (TRIAMCINOLONE 0.1 % CREAM : EUCERIN) CREA Apply 1 application topically 3 (three) times daily as needed. Patient not taking: Reported on 04/03/2021 02/27/21  Blanchard Kelch, NP      Allergies    Metformin and related    Review of Systems   Review of Systems  Respiratory:  Positive for cough and shortness of breath.   Neurological:  Positive for headaches.   Physical Exam Updated Vital Signs BP 120/86    Pulse (!) 107    Temp 99 F (37.2 C) (Oral)    Resp (!) 21    SpO2 92%  Physical Exam Vitals and nursing note reviewed.  Constitutional:      Appearance: He is ill-appearing.     Comments:  Unwell appearing 51 year old male  HENT:     Head: Normocephalic and atraumatic.     Nose: Nose normal.  Eyes:     General: No scleral icterus. Cardiovascular:     Rate and Rhythm: Regular rhythm. Tachycardia present.     Pulses: Normal pulses.     Heart sounds: Normal heart sounds.  Pulmonary:     Effort: Pulmonary effort is normal. No respiratory distress.     Breath sounds: Rhonchi present.     Comments: Rhonchorous coarse lung sounds diffusely throughout tachypnea.  On 2 L nasal cannula without hypoxia Abdominal:     Palpations: Abdomen is soft.     Tenderness: There is no abdominal tenderness.  Musculoskeletal:     Cervical back: Normal range of motion.     Right lower leg: No edema.     Left lower leg: No edema.  Skin:    General: Skin is warm and dry.     Capillary Refill: Capillary refill takes less than 2 seconds.  Neurological:     Mental Status: He is alert. Mental status is at baseline.  Psychiatric:        Mood and Affect: Mood normal.        Behavior: Behavior normal.    ED Results / Procedures / Treatments   Labs (all labs ordered are listed, but only abnormal results are displayed) Labs Reviewed  CBC WITH DIFFERENTIAL/PLATELET - Abnormal; Notable for the following components:      Result Value   RBC 3.87 (*)    MCV 101.0 (*)    Monocytes Absolute 1.3 (*)    All other components within normal limits  COMPREHENSIVE METABOLIC PANEL - Abnormal; Notable for the following components:   Glucose, Bld 127 (*)    Creatinine, Ser 1.46 (*)    Albumin 2.7 (*)    AST 13 (*)    GFR, Estimated 58 (*)    All other components within normal limits  I-STAT CHEM 8, ED - Abnormal; Notable for the following components:   Creatinine, Ser 1.40 (*)    Glucose, Bld 125 (*)    Calcium, Ion 1.01 (*)    All other components within normal limits  CULTURE, BLOOD (ROUTINE X 2)  CULTURE, BLOOD (ROUTINE X 2)  RESP PANEL BY RT-PCR (FLU A&B, COVID) ARPGX2  LACTIC ACID, PLASMA   PROTIME-INR  APTT  LACTIC ACID, PLASMA  T-HELPER CELLS (CD4) COUNT (NOT AT [facility])  URINALYSIS, ROUTINE W REFLEX MICROSCOPIC    EKG None  Radiology DG Chest Portable 1 View  Result Date: 04/16/2021 CLINICAL DATA:  Cough, shortness of breath EXAM: PORTABLE CHEST 1 VIEW COMPARISON:  Chest x-ray 03/27/2021 FINDINGS: Cardiomediastinal silhouette is grossly unchanged. Heart size within normal limits. Extensive irregular chronic interstitial opacities visualized throughout the left lung and bilateral upper lungs near the apices. Interval development of ground-glass consolidative opacity in the right lower  lung zone. No pleural effusion or pneumothorax visualized. IMPRESSION: 1. New consolidation in the right lower lung zone, consistent with pneumonia. 2. Extensive chronic changes in the upper lungs and left lung. Electronically Signed   By: Jannifer Hick M.D.   On: 04/16/2021 20:05    Procedures .Critical Care Performed by: Gailen Shelter, PA Authorized by: Gailen Shelter, PA   Critical care provider statement:    Critical care time (minutes):  35   Critical care time was exclusive of:  Separately billable procedures and treating other patients and teaching time   Critical care was necessary to treat or prevent imminent or life-threatening deterioration of the following conditions:  Respiratory failure   Critical care was time spent personally by me on the following activities:  Development of treatment plan with patient or surrogate, review of old charts, re-evaluation of patient's condition, pulse oximetry, ordering and review of radiographic studies, ordering and review of laboratory studies, ordering and performing treatments and interventions, obtaining history from patient or surrogate, examination of patient and evaluation of patient's response to treatment   Care discussed with: admitting provider      Medications Ordered in ED Medications  lactated ringers infusion (has no  administration in time range)  cefTRIAXone (ROCEPHIN) 2 g in sodium chloride 0.9 % 100 mL IVPB (0 g Intravenous Stopped 04/16/21 2153)  azithromycin (ZITHROMAX) 500 mg in sodium chloride 0.9 % 250 mL IVPB (500 mg Intravenous New Bag/Given 04/16/21 2152)  lactated ringers bolus 1,000 mL (has no administration in time range)  acetaminophen (TYLENOL) tablet 650 mg (650 mg Oral Given 04/16/21 2106)  lactated ringers bolus 1,000 mL (1,000 mLs Intravenous New Bag/Given 04/16/21 2150)  diphenhydrAMINE (BENADRYL) capsule 50 mg (50 mg Oral Given 04/16/21 2106)    ED Course/ Medical Decision Making/ A&P Clinical Course as of 04/16/21 2315  Wed Apr 16, 2021  2052 Discussed with Dr. Luciana Axe of inf disease. Agrees with CAP PNA tx.  [WF]  2204 Labs without any significant changes  [WF]  2205 IMPRESSION: 1. New consolidation in the right lower lung zone, consistent with pneumonia. 2. Extensive chronic changes in the upper lungs and left lung.   [WF]    Clinical Course User Index [WF] Gailen Shelter, PA                           Medical Decision Making Amount and/or Complexity of Data Reviewed Labs: ordered. Radiology: ordered.  Risk OTC drugs. Prescription drug management. Decision regarding hospitalization.   This patient presents to the ED for concern of SOB, cough, fatigue, body aches, this involves a number of treatment options, and is a complaint that carries with it a high risk of complications and morbidity.  The differential diagnosis includes The causes for shortness of breath include but are not limited to Cardiac (AHF, pericardial effusion and tamponade, arrhythmias, ischemia, etc) Respiratory (COPD, asthma, pneumonia, pneumothorax, primary pulmonary hypertension, PE/VQ mismatch) Hematological (anemia) Neuromuscular (ALS, Guillain-Barr, etc)    Co morbidities: Discussed in HPI   Brief History:  1 month of cough, body aches, feeling fatigued and achy seems that his symptoms are  worse over the past 24 to 48 hours.  Patient came in hypoxic SPO2 low 90s on 2 L. Tachycardic at presentation but improving with IV hydration.  Code sepsis initiated Rocephin and azithromycin.  Discussed with infectious disease  EMR reviewed including pt PMHx, past surgical history and past visits to ER.  See HPI for more details   Lab Tests:  I ordered and independently interpreted labs.  The pertinent results include:    I personally reviewed all laboratory work and imaging. Metabolic panel without any acute abnormality specifically kidney function within normal limits and no significant electrolyte abnormalities. CBC without leukocytosis or significant anemia.   Imaging Studies:  Abnormal findings. I personally reviewed all imaging studies. Imaging notable for   Cardiac Monitoring:  The patient was maintained on a cardiac monitor.  I personally viewed and interpreted the cardiac monitored which showed an underlying rhythm of: sinus tachycardia  EKG non-ischemic   Medicines ordered:  I ordered medication including IV fluids, empiric antibiotics, Benadryl, Tylenol for itching, CAP, myalgias Reevaluation of the patient after these medicines showed that the patient improved I have reviewed the patients home medicines and have made adjustments as needed   Critical Interventions:  Early initiation of antibiotics and fluids   Consults:  I requested consultation with infectious disease,  and discussed lab and imaging findings as well as pertinent plan - they recommend: Continuation on initiated antibiotic therapy    Reevaluation:  After the interventions noted above I re-evaluated patient and found that they have :improved   Social Determinants of Health:  The patient's social determinants of health were a factor in the care of this patient    Problem List / ED Course:  HIV, pneumonia, sepsis   Dispostion:  After consideration of the diagnostic results and  the patients response to treatment, I feel that the patent would benefit from admission    Final Clinical Impression(s) / ED Diagnoses Final diagnoses:  Community acquired pneumonia of right lower lobe of lung    Rx / DC Orders ED Discharge Orders     None         Gailen ShelterFondaw, Jyquan Kenley S, [place] 04/17/21 0032    Ernie AvenaLawsing, James, MD 04/17/21 0104

## 2021-04-16 NOTE — Assessment & Plan Note (Signed)
Continue HIV meds 

## 2021-04-16 NOTE — Assessment & Plan Note (Signed)
Euvolemic. 

## 2021-04-16 NOTE — Assessment & Plan Note (Signed)
Not taking any systemic anticoagulants

## 2021-04-16 NOTE — Subjective & Objective (Signed)
Chief complaint: Shortness of breath, cough, fever History of present illness: 51 year old African male who only speaks [name] with a history of HIV, reported history of chronic systolic heart failure with last echo in August 2022 with an EF of 45%, RV dysfunction with reduced EF, history of pneumocystis pneumonia, type 2 diabetes, history of colostomy, history of tuberculosis presents to the ER today with a 1 week history of shortness of breath, cough, fever.  Patient's had a dry cough.  Patient ran out of his Bactrim at least 3 days ago.  Patient states that his been taking his HIV meds.  History obtained with the use of a [name] interpreter via iPad.  In the ER, room air saturations 88%.  White count 8.4, hemoglobin 13.1, platelets 270  Sodium 137, potassium 3.6, BUN of 9, creatinine 1.46.  Baseline creatinine 1.36-1.7  Chest X demonstrated right lower lobe pneumonia.  Due To the patient's right lower lobe pneumonia, acute hypoxic respiratory failure, [facility] hospitalist contacted for admission.

## 2021-04-16 NOTE — Assessment & Plan Note (Addendum)
Stable.  Add sliding scale.

## 2021-04-16 NOTE — H&P (Signed)
History and Physical    [name] [id number] DOB: October 14, 1970 DOA: 04/16/2021  DOS: the patient was seen and examined on 04/16/2021  PCP: Pcp, No   Patient coming from: Home  I have personally briefly reviewed patient's old medical records in [place]  Chief complaint: Shortness of breath, cough, fever History of present illness: 51 year old African male who only speaks [name] with a history of HIV, reported history of chronic systolic heart failure with last echo in August 2022 with an EF of 45%, RV dysfunction with reduced EF, history of pneumocystis pneumonia, type 2 diabetes, history of colostomy, history of tuberculosis presents to the ER today with a 1 week history of shortness of breath, cough, fever.  Patient's had a dry cough.  Patient ran out of his Bactrim at least 3 days ago.  Patient states that his been taking his HIV meds.  History obtained with the use of a [name] interpreter via iPad.  In the ER, room air saturations 88%.  White count 8.4, hemoglobin 13.1, platelets 270  Sodium 137, potassium 3.6, BUN of 9, creatinine 1.46.  Baseline creatinine 1.36-1.7  Chest X demonstrated right lower lobe pneumonia.  Due To the patient's right lower lobe pneumonia, acute hypoxic respiratory failure, [facility] hospitalist contacted for admission.   ED Course: labs unremarkable except for CKD. CXR shows RLL pneumonia  Review of Systems:  Review of Systems  Constitutional:  Positive for chills, fever and malaise/fatigue.  HENT: Negative.    Eyes: Negative.   Respiratory:  Positive for cough and shortness of breath.   Cardiovascular: Negative.   Gastrointestinal: Negative.   Genitourinary: Negative.   Musculoskeletal: Negative.   Skin:  Positive for itching.  Neurological: Negative.   Endo/Heme/Allergies: Negative.   Psychiatric/Behavioral: Negative.    All other systems reviewed and are negative.  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[place]) 04/24/2016   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[place])    TB (pulmonary tuberculosis)    previously treated according to refugee documentation    Past Surgical History:  Procedure Laterality Date   FRACTURE SURGERY     IM NAILING TIBIA Right 03/21/2014   ORIF ANKLE FRACTURE Right 03/21/2014   lateral malleolus/notes 03/21/2014   ORIF ANKLE FRACTURE Right 03/21/2014   Procedure: OPEN REDUCTION INTERNAL FIXATION (ORIF) pilon ;  Surgeon: Marybelle Killings, MD;  Location: [facility];  Service: Orthopedics;  Laterality: Right;   TEE WITHOUT CARDIOVERSION N/A 05/23/2019   Procedure: TRANSESOPHAGEAL ECHOCARDIOGRAM (TEE);  Surgeon: Skeet Latch, MD;  Location: [facility];  Service: Cardiovascular;  Laterality: N/A;   TIBIA IM NAIL INSERTION Right 03/21/2014   Procedure: INTRAMEDULLARY (IM) NAIL TIBIAL;  Surgeon: Marybelle Killings, MD;  Location: [facility];  Service: Orthopedics;  Laterality: Right;   VIDEO BRONCHOSCOPY Bilateral 03/02/2016   Procedure: VIDEO BRONCHOSCOPY WITHOUT FLUORO;  Surgeon: Javier Glazier, MD;  Location:  [facility];  Service: Cardiopulmonary;  Laterality: Bilateral;     reports that he quit smoking about 20 years ago. His smoking use included cigarettes. He has a 3.00 pack-year smoking history. He has never used smokeless tobacco. He reports current alcohol use. He reports that he does not use drugs.  Allergies  Allergen Reactions   Metformin And Related Swelling  and Rash    Family History  Problem Relation Age of Onset   Hypertension Other    Heart disease Sister     Prior to Admission medications   Medication Sig Start Date End Date Taking? Authorizing Provider  albuterol (VENTOLIN HFA) 108 (90 Base) MCG/ACT inhaler Inhale 2 puffs into the lungs every 6 (six) hours as needed for wheezing or shortness of breath. 03/27/21  Yes Prosperi, Christian H, PA-C  Darunavir-Cobicistat-Emtricitabine-Tenofovir Alafenamide (SYMTUZA) 800-150-200-10 MG TABS Take 1 tablet by mouth daily with breakfast. 02/10/21  Yes Ghimire, Henreitta Leber, MD  hydrOXYzine (ATARAX) 25 MG tablet Take 1 tablet (25 mg total) by mouth every 6 (six) hours for 20 days. For itching 04/03/21 04/23/21 Yes [place] Callas, NP  azithromycin (ZITHROMAX) 600 MG tablet Take 2 tablets (1,200 mg total) by mouth every 7 (seven) days. Patient not taking: Reported on 04/03/2021 02/10/21   Jonetta Osgood, MD  benzonatate (TESSALON) 200 MG capsule Take 1 capsule (200 mg total) by mouth 3 (three) times daily as needed for cough. Patient not taking: Reported on 03/13/2021 02/10/21   Jonetta Osgood, MD  Clobetasol Propionate (CLOBETASOL 17 PROPIONATE) 0.5 % POWD 1 application by Does not apply route in the morning and at bedtime. Do not apply to face, armpits, groins. Limit use to 2 weeks maximum in any one area Patient not taking: Reported on 04/03/2021 03/27/21   Prosperi, Christian H, PA-C  dolutegravir (TIVICAY) 50 MG tablet Take 1 tablet (50 mg total) by mouth daily. Patient not taking: Reported on 04/16/2021 02/10/21   Jonetta Osgood, MD  enoxaparin (LOVENOX) 120 MG/0.8ML injection Inject 1 syringe (120 mg total) into the skin daily. Patient not taking: Reported on 04/16/2021 02/10/21   Jonetta Osgood, MD  famotidine (PEPCID) 40 MG tablet Take 1 tablet (40 mg total) by mouth daily. Patient not taking: Reported on 04/16/2021 04/03/21   [place] Callas, NP  fluconazole (DIFLUCAN) 150 MG tablet  Take 2 tablets (300 mg total) by mouth once a week for 6 doses. Patient not taking: Reported on 04/16/2021 04/03/21 05/09/21  [place] Callas, NP  guaiFENesin (MUCINEX) 600 MG 12 hr tablet Take 1 tablet (600 mg total) by mouth 2 (two) times daily. Patient not taking: Reported on 03/13/2021 02/10/21   Jonetta Osgood, MD  predniSONE (DELTASONE) 50 MG tablet Take 1 tablet (50 mg total) by mouth daily. Patient not taking: Reported on 04/03/2021 03/27/21   Prosperi, Christian H, PA-C  sulfamethoxazole-trimethoprim (BACTRIM DS) 800-160 MG tablet Take 1 tablet by mouth daily. Patient not taking: Reported on 04/03/2021 02/10/21   Jonetta Osgood, MD  Triamcinolone Acetonide (TRIAMCINOLONE 0.1 % CREAM : EUCERIN) CREA Apply 1 application topically 3 (three) times daily as needed. Patient not taking: Reported on 04/03/2021 02/27/21   [place] Callas, NP    Physical Exam: Vitals:   04/16/21 2200 04/16/21 2204 04/16/21 2240 04/16/21 2300  BP: 120/86  92/75 101/66  Pulse: (!) 107  94 (!) 102  Resp: (!) 21  (!) 25 (!) 25  Temp:  99 F (37.2 C)    TempSrc:  Oral    SpO2: 92%  95% 92%    Physical Exam   Labs on Admission: I have personally reviewed following labs and imaging studies  CBC: Recent Labs  Lab 04/16/21 1954 04/16/21 2108  WBC 8.4  --   NEUTROABS 5.4  --   HGB 13.1 13.3  HCT 39.1 39.0  MCV 101.0*  --   PLT 270  --    Basic Metabolic Panel: Recent Labs  Lab 04/16/21 1954 04/16/21 2108  NA 137 137  K 3.6 3.6  CL 98 99  CO2 29  --   GLUCOSE 127* 125*  BUN 9 10  CREATININE 1.46* 1.40*  CALCIUM 9.2  --    GFR: Estimated Creatinine Clearance: 71.3 mL/min (A) (by C-G formula based on SCr of 1.4 mg/dL (H)). Liver Function Tests: Recent Labs  Lab 04/16/21 1954  AST 13*  ALT 9  ALKPHOS 56  BILITOT 0.9  PROT 8.0  ALBUMIN 2.7*   No results for input(s): LIPASE, AMYLASE in the last 168 hours. No results for input(s): AMMONIA in the last 168 hours. Coagulation  Profile: Recent Labs  Lab 04/16/21 2019  INR 1.2   Cardiac Enzymes: No results for input(s): CKTOTAL, CKMB, CKMBINDEX, TROPONINI in the last 168 hours. BNP (last 3 results) No results for input(s): PROBNP in the last 8760 hours. HbA1C: No results for input(s): HGBA1C in the last 72 hours. CBG: No results for input(s): GLUCAP in the last 168 hours. Lipid Profile: No results for input(s): CHOL, HDL, LDLCALC, TRIG, CHOLHDL, LDLDIRECT in the last 72 hours. Thyroid Function Tests: No results for input(s): TSH, T4TOTAL, FREET4, T3FREE, THYROIDAB in the last 72 hours. Anemia Panel: No results for input(s): VITAMINB12, FOLATE, FERRITIN, TIBC, IRON, RETICCTPCT in the last 72 hours. Urine analysis:    Component Value Date/Time   COLORURINE YELLOW 11/01/2020 1848   APPEARANCEUR HAZY (A) 11/01/2020 1848   LABSPEC 1.039 (H) 11/01/2020 1848   PHURINE 6.0 11/01/2020 1848   GLUCOSEU NEGATIVE 11/01/2020 1848   HGBUR SMALL (A) 11/01/2020 1848   BILIRUBINUR NEGATIVE 11/01/2020 1848   KETONESUR NEGATIVE 11/01/2020 1848   PROTEINUR 30 (A) 11/01/2020 1848   UROBILINOGEN 1.0 03/23/2014 1400   NITRITE NEGATIVE 11/01/2020 1848   LEUKOCYTESUR NEGATIVE 11/01/2020 1848    Radiological Exams on Admission: I have personally reviewed images DG Chest Portable 1 View  Result Date: 04/16/2021 CLINICAL DATA:  Cough, shortness of breath EXAM: PORTABLE CHEST 1 VIEW COMPARISON:  Chest x-ray 03/27/2021 FINDINGS: Cardiomediastinal silhouette is grossly unchanged. Heart size within normal limits. Extensive irregular chronic interstitial opacities visualized throughout the left lung and bilateral upper lungs near the apices. Interval development of ground-glass consolidative opacity in the right lower lung zone. No pleural effusion or pneumothorax visualized. IMPRESSION: 1. New consolidation in the right lower lung zone, consistent with pneumonia. 2. Extensive chronic changes in the upper lungs and left lung.  Electronically Signed   By: Ofilia Neas M.D.   On: 04/16/2021 20:05    EKG: I have personally reviewed EKG: NSR    Assessment/Plan Principal Problem:   Community acquired pneumonia Active Problems:   Acute respiratory failure with hypoxia ([name])   HIV (human immunodeficiency virus infection) ([place])   Chronic systolic CHF (congestive heart failure) ([name])   Type 2 diabetes mellitus ([name])   History of Pneumocystis jirovecii pneumonia   Stage 3b chronic kidney disease (CKD) ([name]) - baseline SCr 1.39-1.72   History of pulmonary embolus (PE)   S/P colostomy ([place])  Pruritic dermatitis    Assessment and Plan: * Community acquired pneumonia- (present on admission) Admit to observation medical bed.  IV Rocephin and p.o. Zithromax.  Check Legionella and strep pneumo titers.  Acute respiratory failure with hypoxia ([place])- (present on admission) Continue supplemental oxygen.  Pruritic dermatitis- (present on admission) IV benadryl x 1. Prn atarax  S/P colostomy ([name]) Chronic  History of pulmonary embolus (PE)- (present on admission) Not taking any systemic anticoagulants  Stage 3b chronic kidney disease (CKD) ([name]) - baseline SCr 1.39-1.72- (present on admission) Chronic.  History of Pneumocystis jirovecii pneumonia- (present on admission) ID consult in the morning.  Restart Bactrim.  Type 2 diabetes mellitus ([name]) Stable.  Add sliding scale.  Chronic systolic CHF (congestive heart failure) ([place])- (present on admission) Euvolemic.  HIV (human immunodeficiency virus infection) ([place])- (present on admission) Continue HIV meds.   DVT prophylaxis: SQ Heparin Code Status: Full Code Family Communication: no family at bedside  Disposition Plan: return home  Consults called: ID  Admission status: Observation, Med-Surg   Kristopher Oppenheim, DO [facility] 04/16/2021, 11:36 PM

## 2021-04-16 NOTE — Assessment & Plan Note (Signed)
ID consult in the morning.  Restart Bactrim.

## 2021-04-17 DIAGNOSIS — E1122 Type 2 diabetes mellitus with diabetic chronic kidney disease: Secondary | ICD-10-CM

## 2021-04-17 DIAGNOSIS — J189 Pneumonia, unspecified organism: Principal | ICD-10-CM

## 2021-04-17 DIAGNOSIS — B20 Human immunodeficiency virus [HIV] disease: Secondary | ICD-10-CM

## 2021-04-17 DIAGNOSIS — L308 Other specified dermatitis: Secondary | ICD-10-CM | POA: Diagnosis not present

## 2021-04-17 DIAGNOSIS — Z933 Colostomy status: Secondary | ICD-10-CM

## 2021-04-17 DIAGNOSIS — I5022 Chronic systolic (congestive) heart failure: Secondary | ICD-10-CM | POA: Diagnosis not present

## 2021-04-17 DIAGNOSIS — J9601 Acute respiratory failure with hypoxia: Secondary | ICD-10-CM

## 2021-04-17 DIAGNOSIS — Z86711 Personal history of pulmonary embolism: Secondary | ICD-10-CM

## 2021-04-17 DIAGNOSIS — N1832 Chronic kidney disease, stage 3b: Secondary | ICD-10-CM

## 2021-04-17 LAB — COMPREHENSIVE METABOLIC PANEL
ALT: 9 U/L (ref 0–44)
AST: 12 U/L — ABNORMAL LOW (ref 15–41)
Albumin: 2.3 g/dL — ABNORMAL LOW (ref 3.5–5.0)
Alkaline Phosphatase: 50 U/L (ref 38–126)
Anion gap: 11 (ref 5–15)
BUN: 8 mg/dL (ref 6–20)
CO2: 30 mmol/L (ref 22–32)
Calcium: 8.8 mg/dL — ABNORMAL LOW (ref 8.9–10.3)
Chloride: 98 mmol/L (ref 98–111)
Creatinine, Ser: 1.31 mg/dL — ABNORMAL HIGH (ref 0.61–1.24)
GFR, Estimated: >60 mL/min (ref >60)
Glucose, Bld: 117 mg/dL — ABNORMAL HIGH (ref 70–99)
Potassium: 3.5 mmol/L (ref 3.5–5.1)
Sodium: 139 mmol/L (ref 135–145)
Total Bilirubin: 0.6 mg/dL (ref 0.3–1.2)
Total Protein: 7 g/dL (ref 6.5–8.1)

## 2021-04-17 LAB — CBC WITH DIFFERENTIAL/PLATELET
Abs Immature Granulocytes: 0.04 10*3/uL (ref 0.00–0.07)
Basophils Absolute: 0 10*3/uL (ref 0.0–0.1)
Basophils Relative: 0 %
Eosinophils Absolute: 0.4 10*3/uL (ref 0.0–0.5)
Eosinophils Relative: 6 %
HCT: 37.4 % — ABNORMAL LOW (ref 39.0–52.0)
Hemoglobin: 12.3 g/dL — ABNORMAL LOW (ref 13.0–17.0)
Immature Granulocytes: 1 %
Lymphocytes Relative: 12 %
Lymphs Abs: 0.9 10*3/uL (ref 0.7–4.0)
MCH: 33.5 pg (ref 26.0–34.0)
MCHC: 32.9 g/dL (ref 30.0–36.0)
MCV: 101.9 fL — ABNORMAL HIGH (ref 80.0–100.0)
Monocytes Absolute: 1 10*3/uL (ref 0.1–1.0)
Monocytes Relative: 14 %
Neutro Abs: 4.9 10*3/uL (ref 1.7–7.7)
Neutrophils Relative %: 67 %
Platelets: 222 10*3/uL (ref 150–400)
RBC: 3.67 MIL/uL — ABNORMAL LOW (ref 4.22–5.81)
RDW: 12.2 % (ref 11.5–15.5)
WBC: 7.1 10*3/uL (ref 4.0–10.5)
nRBC: 0 % (ref 0.0–0.2)

## 2021-04-17 LAB — T-HELPER CELLS (CD4) COUNT (NOT AT ARMC)
CD4 % Helper T Cell: 9 % — ABNORMAL LOW (ref 33–65)
CD4 T Cell Abs: 120 /uL — ABNORMAL LOW (ref 400–1790)

## 2021-04-17 LAB — URINALYSIS, ROUTINE W REFLEX MICROSCOPIC
Bilirubin Urine: NEGATIVE
Glucose, UA: NEGATIVE mg/dL
Hgb urine dipstick: NEGATIVE
Ketones, ur: NEGATIVE mg/dL
Leukocytes,Ua: NEGATIVE
Nitrite: NEGATIVE
Protein, ur: NEGATIVE mg/dL
Specific Gravity, Urine: 1.02 (ref 1.005–1.030)
pH: 6 (ref 5.0–8.0)

## 2021-04-17 LAB — MAGNESIUM: Magnesium: 1.7 mg/dL (ref 1.7–2.4)

## 2021-04-17 LAB — LACTIC ACID, PLASMA: Lactic Acid, Venous: 0.7 mmol/L (ref 0.5–1.9)

## 2021-04-17 MED ORDER — TRIAMCINOLONE 0.1 % CREAM:EUCERIN CREAM 1:1
TOPICAL_CREAM | Freq: Three times a day (TID) | CUTANEOUS | Status: DC
  Filled 2021-04-17 (×2): qty 1

## 2021-04-17 MED ORDER — SULFAMETHOXAZOLE-TRIMETHOPRIM 800-160 MG PO TABS
1.0000 | ORAL_TABLET | Freq: Every day | ORAL | Status: DC
  Administered 2021-04-17 – 2021-04-22 (×6): 1 via ORAL
  Filled 2021-04-17 (×6): qty 1

## 2021-04-17 NOTE — ED Notes (Signed)
New colostomy bag and dressing change. Pt cleaned and new gown on pt.

## 2021-04-17 NOTE — Consult Note (Signed)
[facility]       Reason for Consult: pneumonia    Referring Physician: Dr. Onalee Hua  Principal Problem:   Community acquired pneumonia Active Problems:   HIV (human immunodeficiency virus infection) ([name])   Chronic systolic CHF (congestive heart failure) ([name])   Acute respiratory failure with hypoxia ([name])   Type 2 diabetes mellitus ([name])   History of Pneumocystis jirovecii pneumonia   Stage 3b chronic kidney disease (CKD) ([name]) - baseline SCr 1.39-1.72   History of pulmonary embolus (PE)   S/P colostomy ([name])   Pruritic dermatitis    Darunavir-Cobicistat-Emtricitabine-Tenofovir Alafenamide  1 tablet Oral Q breakfast   dolutegravir  50 mg Oral Daily   heparin  5,000 Units Subcutaneous Q8H   sulfamethoxazole-trimethoprim  1 tablet Oral Daily   triamcinolone 0.1 % cream : eucerin   Topical TID    Recommendations: Continue with ceftriaxone Will stop azithromycin Continue home ARVs, Bactrim Topical eczema treatment  Assessment: He has underlying lung disease with no formal evaluation and a right lower lobe opacity concerning for community acquired pneumonia.  Not consistent with pneumocystis.   He also has significant wheezes and may benefit with inhalers. He has continued itching and rash noted looks most c/w eczema.  Will try topical therapy   Antibiotics: Ceftriaxone and azithromycin  HPI: [name] is a 50 y.o. male with a history of HIV followed in [facility], poor compliance, underlying bronchiectasis, likely COPD here with shortness of breath, cough with productive sputum and CXR with right lower lobe opacity.  He had been feeling poorly for several days.   Similar presentation in November 2022.  He has been off his Bactrim.  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  He has been undetectable since August 2022 and CD 4 up to 120 now.  His main complaint is his ongoing issue with itching along with the shortness of breath.  He has  had no fever.  History of tuberculosis.  WBC wnl.    Review of Systems:  Constitutional: positive for malaise or negative for fevers and chills Respiratory: positive for cough or sputum Gastrointestinal: negative for nausea and diarrhea Integument/breast: positive for rash and pruritus All other systems reviewed and are negative    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)    TB (pulmonary tuberculosis)    previously treated according to refugee documentation    Social History   Tobacco Use   Smoking status: Former    Packs/day: 0.50    Years: 6.00    Pack years: 3.00    Types: Cigarettes    Quit date: 03/16/2001    Years since quitting: 20.1   Smokeless tobacco: Never   Tobacco comments:    "quit smoking ~ 2003"  Vaping Use   Vaping Use: Never used  Substance Use Topics   Alcohol use: Yes    Comment: drinks bottled beer intermittently   Drug use: No    Family History  Problem Relation Age of Onset   Hypertension Other    Heart disease Sister     Allergies  Allergen Reactions   Metformin And Related Swelling and Rash    Physical Exam: Constitutional: in no apparent distress  Vitals:   04/17/21 1315 04/17/21 1351  BP: 114/76 114/73  Pulse: 88 93  Resp: 16 16  Temp:  98.5 F (36.9 C)  SpO2: 99% 95%   EYES: anicteric ENMT: no thrush Cardiovascular: Cor  RRR Respiratory: diffuse wheezes bilateral GI: soft Musculoskeletal: no edema Skin: maculopapular rash on the back of the neck Neuro: non-focal  Lab Results  Component Value Date   WBC 7.1 04/17/2021   HGB 12.3 (L) 04/17/2021   HCT 37.4 (L) 04/17/2021   MCV 101.9 (H) 04/17/2021   PLT 222 04/17/2021    Lab Results  Component Value Date   CREATININE 1.31 (H) 04/17/2021   BUN 8 04/17/2021   NA 139 04/17/2021   K 3.5 04/17/2021   CL 98 04/17/2021   CO2 30 04/17/2021    Lab Results  Component Value Date   ALT 9 04/17/2021   AST 12 (L) 04/17/2021   ALKPHOS 50 04/17/2021     Microbiology: Recent Results (from the past 240 hour(s))  Resp Panel by RT-PCR (Flu A&B, Covid) Nasopharyngeal Swab     Status: None   Collection Time: 04/16/21  8:19 PM   Specimen: Nasopharyngeal Swab; Nasopharyngeal(NP) swabs in vial transport medium  Result Value Ref Range Status   SARS Coronavirus 2 by RT PCR NEGATIVE NEGATIVE Final    Comment: (NOTE) SARS-CoV-2 target nucleic acids are NOT DETECTED.  The SARS-CoV-2 RNA is generally detectable in upper respiratory specimens during the acute phase of infection. The lowest concentration of SARS-CoV-2 viral copies this assay can detect is 138 copies/mL. A negative result does not preclude SARS-Cov-2 infection and should not be used as the sole basis for treatment or other patient management decisions. A negative result may occur with  improper specimen collection/handling, submission of specimen other than nasopharyngeal swab, presence of viral mutation(s) within the areas targeted by this assay, and inadequate number of viral copies(<138 copies/mL). A negative result must be combined with clinical observations, patient history, and epidemiological information. The expected result is Negative.  Fact Sheet for Patients:  EntrepreneurPulse.com.au  Fact Sheet for Healthcare Providers:   IncredibleEmployment.be  This test is no t yet approved or cleared by the [place] FDA and  has been authorized for detection and/or diagnosis of SARS-CoV-2 by FDA under an Emergency Use Authorization (EUA). This EUA will remain  in effect (meaning this test can be used) for the duration of the COVID-19 declaration under Section 564(b)(1) of the Act, 21 U.S.C.section 360bbb-3(b)(1), unless the authorization is terminated  or revoked sooner.       Influenza A by PCR NEGATIVE NEGATIVE Final   Influenza B by PCR NEGATIVE NEGATIVE Final    Comment: (NOTE) The Xpert Xpress SARS-CoV-2/FLU/RSV plus assay is intended as an aid in the diagnosis of influenza from Nasopharyngeal swab specimens and should not be used as a sole basis for treatment. Nasal washings and aspirates are unacceptable for Xpert Xpress SARS-CoV-2/FLU/RSV testing.  Fact Sheet for Patients: EntrepreneurPulse.com.au  Fact Sheet for Healthcare Providers: IncredibleEmployment.be  This test is not yet approved or cleared by the [place] and  has been authorized for detection and/or diagnosis of SARS-CoV-2 by FDA under an Emergency Use Authorization (EUA). This EUA will remain in effect (meaning this test can be used) for the duration of the COVID-19 declaration under Section 564(b)(1) of the Act, 21 U.S.C. section 360bbb-3(b)(1), unless the authorization is terminated or revoked.  Performed at [facility], [place] [address], [place], [place] [address]   Blood culture (routine x 2)     Status: None (Preliminary result)   Collection Time: 04/16/21  8:55 PM   Specimen: BLOOD  Result Value Ref Range Status   Specimen Description BLOOD BLOOD LEFT HAND  Final   Special Requests   Final    BOTTLES DRAWN AEROBIC AND ANAEROBIC Blood Culture adequate volume   Culture   Final    NO GROWTH < 12 HOURS Performed at [facility], [place] [address]  [place], [place] [address]    Report Status PENDING  Incomplete  Blood culture (routine x 2)     Status: None (Preliminary result)   Collection Time: 04/16/21  8:59 PM   Specimen: BLOOD  Result Value Ref Range Status   Specimen Description BLOOD RIGHT ANTECUBITAL  Final   Special Requests   Final    BOTTLES DRAWN AEROBIC AND ANAEROBIC Blood Culture adequate volume   Culture   Final    NO GROWTH < 12 HOURS Performed at [facility], [place] [address] [place], [place] [address]    Report Status PENDING  Incomplete    Thayer Headings, [facility] [place] [email or web address] 04/17/2021, 4:05 PM

## 2021-04-17 NOTE — Plan of Care (Signed)

## 2021-04-17 NOTE — ED Notes (Signed)
Provided pt w/ urinal. Pt resting comfortably in bed, no acute distress. No other needs expressed.

## 2021-04-17 NOTE — Progress Notes (Signed)
Patient coming from: Home  HPI 51 year old African male who only speaks [name] with a history of HIV, reported history of chronic systolic heart failure with last echo in August 2022 with an EF of 45%, RV dysfunction with reduced EF, history of pneumocystis pneumonia, type 2 diabetes, history of colostomy, history of tuberculosis presents to the ER today with a 1 week history of shortness of breath, cough, fever.  Patient's had a dry cough.  Patient ran out of his Bactrim at least 3 days ago.  Patient states that his been taking his HIV meds.   ED Course: labs unremarkable except for CKD. CXR shows RLL pneumonia   Subjective Feeling better less short of breath  Physical Exam: Vitals:   04/17/21 0600 04/17/21 0645 04/17/21 0700 04/17/21 0715  BP: 119/75 128/82 117/78 118/72  Pulse: (!) 103 (!) 108 95 99  Resp: (!) 21 (!) 21 19 17   Temp:      TempSrc:      SpO2: 97% 97% 98% 98%    Physical Exam Vitals and nursing note reviewed.  Constitutional:      General: He is not in acute distress.    Appearance: He is well-developed.  Cardiovascular:     Rate and Rhythm: Normal rate and regular rhythm.     Heart sounds: Normal heart sounds. No murmur heard.   No friction rub. No gallop.  Pulmonary:     Effort: Pulmonary effort is normal. No respiratory distress.     Breath sounds: Normal breath sounds. No stridor. No wheezing, rhonchi or rales.  Abdominal:     General: Bowel sounds are normal. There is no distension.     Palpations: Abdomen is soft.     Tenderness: There is no abdominal tenderness.  Musculoskeletal:        General: No swelling or tenderness.     Cervical back: Normal range of motion and neck supple.  Skin:    General: Skin is warm and dry.  Neurological:     Mental Status: He is alert.  Psychiatric:        Mood and Affect: Mood normal.        Behavior: Behavior normal.     Labs on Admission: I have personally reviewed following labs and imaging  studies  CBC: Recent Labs  Lab 04/16/21 1954 04/16/21 2108 04/17/21 0416  WBC 8.4  --  7.1  NEUTROABS 5.4  --  4.9  HGB 13.1 13.3 12.3*  HCT 39.1 39.0 37.4*  MCV 101.0*  --  101.9*  PLT 270  --  [id number]    Basic Metabolic Panel: Recent Labs  Lab 04/16/21 1954 04/16/21 2108 04/17/21 0416  NA 137 137 139  K 3.6 3.6 3.5  CL 98 99 98  CO2 29  --  30  GLUCOSE 127* 125* 117*  BUN 9 10 8   CREATININE 1.46* 1.40* 1.31*  CALCIUM 9.2  --  8.8*  MG  --   --  1.7    GFR: Estimated Creatinine Clearance: 76.2 mL/min (A) (by C-G formula based on SCr of 1.31 mg/dL (H)). Liver Function Tests: Recent Labs  Lab 04/16/21 1954 04/17/21 0416  AST 13* 12*  ALT 9 9  ALKPHOS 56 50  BILITOT 0.9 0.6  PROT 8.0 7.0  ALBUMIN 2.7* 2.3*    No results for input(s): LIPASE, AMYLASE in the last 168 hours. No results for input(s): AMMONIA in the last 168 hours. Coagulation Profile: Recent Labs  Lab 04/16/21 2019  INR 1.2  Cardiac Enzymes: No results for input(s): CKTOTAL, CKMB, CKMBINDEX, TROPONINI in the last 168 hours. BNP (last 3 results) No results for input(s): PROBNP in the last 8760 hours. HbA1C: No results for input(s): HGBA1C in the last 72 hours. CBG: No results for input(s): GLUCAP in the last 168 hours. Lipid Profile: No results for input(s): CHOL, HDL, LDLCALC, TRIG, CHOLHDL, LDLDIRECT in the last 72 hours. Thyroid Function Tests: No results for input(s): TSH, T4TOTAL, FREET4, T3FREE, THYROIDAB in the last 72 hours. Anemia Panel: No results for input(s): VITAMINB12, FOLATE, FERRITIN, TIBC, IRON, RETICCTPCT in the last 72 hours. Urine analysis:    Component Value Date/Time   COLORURINE YELLOW 04/16/2021 [place] 04/16/2021 2345   LABSPEC 1.020 04/16/2021 2345   PHURINE 6.0 04/16/2021 2345   GLUCOSEU NEGATIVE 04/16/2021 2345   HGBUR NEGATIVE 04/16/2021 2345   BILIRUBINUR NEGATIVE 04/16/2021 2345   KETONESUR NEGATIVE 04/16/2021 2345   PROTEINUR  NEGATIVE 04/16/2021 2345   UROBILINOGEN 1.0 03/23/2014 1400   NITRITE NEGATIVE 04/16/2021 2345   LEUKOCYTESUR NEGATIVE 04/16/2021 2345    Radiological Exams on Admission: I have personally reviewed images DG Chest Portable 1 View  Result Date: 04/16/2021 CLINICAL DATA:  Cough, shortness of breath EXAM: PORTABLE CHEST 1 VIEW COMPARISON:  Chest x-ray 03/27/2021 FINDINGS: Cardiomediastinal silhouette is grossly unchanged. Heart size within normal limits. Extensive irregular chronic interstitial opacities visualized throughout the left lung and bilateral upper lungs near the apices. Interval development of ground-glass consolidative opacity in the right lower lung zone. No pleural effusion or pneumothorax visualized. IMPRESSION: 1. New consolidation in the right lower lung zone, consistent with pneumonia. 2. Extensive chronic changes in the upper lungs and left lung. Electronically Signed   By: Ofilia Neas M.D.   On: 04/16/2021 20:05    EKG: I have personally reviewed EKG: NSR    Assessment/Plan Principal Problem:   Community acquired pneumonia Active Problems:   HIV (human immunodeficiency virus infection) ([place])   Chronic systolic CHF (congestive heart failure) ([name])   Acute respiratory failure with hypoxia ([name])   Type 2 diabetes mellitus ([name])   History of Pneumocystis jirovecii pneumonia   Stage 3b chronic kidney disease (CKD) ([name]) - baseline SCr 1.39-1.72   History of pulmonary embolus (PE)   S/P colostomy ([name])   Pruritic dermatitis    Assessment and Plan: * Community acquired pneumonia- (present on admission) Admit to observation medical bed.  IV Rocephin and p.o. Zithromax.  Check Legionella and strep pneumo titers.  Pruritic dermatitis- (present on admission) IV benadryl x 1. Prn atarax  S/P colostomy ([name]) Chronic  History of pulmonary embolus (PE)- (present on admission) Not taking any systemic anticoagulants  Stage 3b chronic kidney disease (CKD) ([name]) -  baseline SCr 1.39-1.72- (present on admission) Chronic.  History of Pneumocystis jirovecii pneumonia- (present on admission) ID consult in the morning.  Restart Bactrim.  Type 2 diabetes mellitus ([name]) Stable.  Add sliding scale.  Acute respiratory failure with hypoxia ([place])- (present on admission) Continue supplemental oxygen.  Chronic systolic CHF (congestive heart failure) ([place])- (present on admission) Euvolemic.  HIV (human immunodeficiency virus infection) ([place])- (present on admission) Continue HIV meds.  ID consult pending.  Disposition plan pending ID evaluation and consultation recommendations.   DVT prophylaxis: SQ Heparin Code Status: Full Code Family Communication: no family at bedside  Disposition Plan: return home  Consults called: ID  Admission status: Observation, Med-Surg   Wilbert Hayashi A, DO [facility] 04/17/2021, 11:16 AM

## 2021-04-18 DIAGNOSIS — J9601 Acute respiratory failure with hypoxia: Secondary | ICD-10-CM | POA: Diagnosis present

## 2021-04-18 DIAGNOSIS — E2740 Unspecified adrenocortical insufficiency: Secondary | ICD-10-CM | POA: Diagnosis present

## 2021-04-18 DIAGNOSIS — Z9981 Dependence on supplemental oxygen: Secondary | ICD-10-CM | POA: Diagnosis not present

## 2021-04-18 DIAGNOSIS — B20 Human immunodeficiency virus [HIV] disease: Secondary | ICD-10-CM | POA: Diagnosis present

## 2021-04-18 DIAGNOSIS — Z8249 Family history of ischemic heart disease and other diseases of the circulatory system: Secondary | ICD-10-CM | POA: Diagnosis not present

## 2021-04-18 DIAGNOSIS — Z87891 Personal history of nicotine dependence: Secondary | ICD-10-CM | POA: Diagnosis not present

## 2021-04-18 DIAGNOSIS — N1832 Chronic kidney disease, stage 3b: Secondary | ICD-10-CM | POA: Diagnosis present

## 2021-04-18 DIAGNOSIS — J189 Pneumonia, unspecified organism: Secondary | ICD-10-CM | POA: Diagnosis present

## 2021-04-18 DIAGNOSIS — L209 Atopic dermatitis, unspecified: Secondary | ICD-10-CM | POA: Diagnosis present

## 2021-04-18 DIAGNOSIS — Z79899 Other long term (current) drug therapy: Secondary | ICD-10-CM | POA: Diagnosis not present

## 2021-04-18 DIAGNOSIS — J470 Bronchiectasis with acute lower respiratory infection: Secondary | ICD-10-CM | POA: Diagnosis present

## 2021-04-18 DIAGNOSIS — Z1624 Resistance to multiple antibiotics: Secondary | ICD-10-CM | POA: Diagnosis present

## 2021-04-18 DIAGNOSIS — I5022 Chronic systolic (congestive) heart failure: Secondary | ICD-10-CM | POA: Diagnosis present

## 2021-04-18 DIAGNOSIS — R0902 Hypoxemia: Secondary | ICD-10-CM | POA: Diagnosis not present

## 2021-04-18 DIAGNOSIS — Z86711 Personal history of pulmonary embolism: Secondary | ICD-10-CM | POA: Diagnosis not present

## 2021-04-18 DIAGNOSIS — Z86718 Personal history of other venous thrombosis and embolism: Secondary | ICD-10-CM | POA: Diagnosis not present

## 2021-04-18 DIAGNOSIS — E875 Hyperkalemia: Secondary | ICD-10-CM | POA: Diagnosis present

## 2021-04-18 DIAGNOSIS — E1122 Type 2 diabetes mellitus with diabetic chronic kidney disease: Secondary | ICD-10-CM | POA: Diagnosis present

## 2021-04-18 DIAGNOSIS — I272 Pulmonary hypertension, unspecified: Secondary | ICD-10-CM | POA: Diagnosis present

## 2021-04-18 DIAGNOSIS — Z20822 Contact with and (suspected) exposure to covid-19: Secondary | ICD-10-CM | POA: Diagnosis present

## 2021-04-18 DIAGNOSIS — Z8611 Personal history of tuberculosis: Secondary | ICD-10-CM | POA: Diagnosis not present

## 2021-04-18 DIAGNOSIS — L308 Other specified dermatitis: Secondary | ICD-10-CM | POA: Diagnosis present

## 2021-04-18 DIAGNOSIS — Z933 Colostomy status: Secondary | ICD-10-CM | POA: Diagnosis not present

## 2021-04-18 LAB — STREP PNEUMONIAE URINARY ANTIGEN: Strep Pneumo Urinary Antigen: NEGATIVE

## 2021-04-18 LAB — LEGIONELLA PNEUMOPHILA SEROGP 1 UR AG: L. pneumophila Serogp 1 Ur Ag: NEGATIVE

## 2021-04-18 MED ORDER — LEVALBUTEROL HCL 0.63 MG/3ML IN NEBU: 0.6300 mg | INHALATION_SOLUTION | Freq: Four times a day (QID) | RESPIRATORY_TRACT | Status: DC | PRN

## 2021-04-18 MED ORDER — IPRATROPIUM-ALBUTEROL 0.5-2.5 (3) MG/3ML IN SOLN: 3.0000 mL | RESPIRATORY_TRACT | Status: DC

## 2021-04-18 MED ORDER — IPRATROPIUM-ALBUTEROL 0.5-2.5 (3) MG/3ML IN SOLN
3.0000 mL | RESPIRATORY_TRACT | Status: DC | PRN
  Administered 2021-04-18 – 2021-04-19 (×3): 3 mL via RESPIRATORY_TRACT
  Filled 2021-04-18 (×4): qty 3

## 2021-04-18 MED ORDER — IPRATROPIUM-ALBUTEROL 0.5-2.5 (3) MG/3ML IN SOLN: 3.0000 mL | Freq: Four times a day (QID) | RESPIRATORY_TRACT | Status: DC

## 2021-04-18 MED ORDER — DIPHENHYDRAMINE HCL 25 MG PO CAPS
25.0000 mg | ORAL_CAPSULE | Freq: Four times a day (QID) | ORAL | Status: DC | PRN
  Administered 2021-04-18 – 2021-04-20 (×2): 25 mg via ORAL
  Filled 2021-04-18 (×2): qty 1

## 2021-04-18 MED ORDER — SODIUM CHLORIDE 0.9 % IV BOLUS
500.0000 mL | Freq: Once | INTRAVENOUS | Status: AC
  Administered 2021-04-18: 500 mL via INTRAVENOUS

## 2021-04-18 NOTE — Progress Notes (Signed)
[facility] for [facility]  Date of Admission:  04/16/2021      Total days of antibiotics 2  Ceftriaxone     ASSESSMENT: [name] is a 51 y.o. 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 CD4 120 during current illness.   Community acquired pneumonia with lobar infiltrate; hypoxic at presentation with history of undiagnosed / untreated chronic lung disease. Not consistent with PJP presentation.   He is wheezing and coughing significantly - no PRN administered tx since admission. D/W his nurse today to ask respiratory to come provide a treatment for him and will D/C Dr. Shanon Brow if we can move them to scheduled tx given his language barrier I worry he cannot request one easily. Will turn him down to 2 LPM from 4 LPM and see how he holds.  He has a follow up with Dr. Baxter Flattery arranged for 2/13 - would like to keep that appt to keep a close eye on him for follow up when he is ready to leave the hospital.   He needs appt with pulmonology outpatient for assistance in working up and treating chronic lung disease. His HIV regimens however may be challenging with chronically inhaled steroids. Beclomethasone is the most benign regarding r/f adrenal insufficiency.   He is frustrated over the rash. Appears most consistent with atopic dermatitis and skin is very dry. Explained he needs to put liberal application of the combination steroid/eucerine on 2-3 times a day and give it some more time.  Encouraged him to ask for hydroxyzine PRN to help with pruritis.    PLAN: Continue HIV regimen [place] + Symtuza taken together daily with food Please consider changing breathing treatments to scheduled  Wean oxygen as able  Continue ceftriaxone for now Continue topical skin treatments and antihistamines   Principal Problem:   Community acquired pneumonia Active Problems:    HIV (human immunodeficiency virus infection) ([place])   Chronic systolic CHF (congestive heart failure) ([name])   Acute respiratory failure with hypoxia ([name])   Type 2 diabetes mellitus ([place])   History of Pneumocystis jirovecii pneumonia   Stage 3b chronic kidney disease (CKD) ([name]) - baseline SCr 1.39-1.72   History of pulmonary embolus (PE)   S/P colostomy ([name])   Pruritic dermatitis    Darunavir-Cobicistat-Emtricitabine-Tenofovir Alafenamide  1 tablet Oral Q breakfast   dolutegravir  50 mg Oral Daily   heparin  5,000 Units Subcutaneous Q8H   sulfamethoxazole-trimethoprim  1 tablet Oral Daily   triamcinolone 0.1 % cream : eucerin   Topical TID    SUBJECTIVE: [name] interpretor used on tablet for visit.  Still very itchy. He says he puts the cream everywhere he itches. Has a lotion at the bedside also to use in between  When he coughs he gets a bad splitting headache and some chest pain. Cough is mostly productive.  No fevers/chills.    Review of Systems: Review of Systems  Constitutional:  Negative for chills and fever.  HENT:  Negative for tinnitus.   Eyes:  Negative for blurred vision and photophobia.  Respiratory:  Positive for cough, sputum production and wheezing.   Cardiovascular:  Negative for chest pain.  Gastrointestinal:  Negative for diarrhea, nausea and vomiting.  Genitourinary:  Negative for dysuria.  Skin:  Negative for rash.  Neurological:  Negative for headaches.   Allergies  Allergen Reactions   Metformin And  Related Swelling and Rash    OBJECTIVE: Vitals:   04/18/21 0500 04/18/21 0519 04/18/21 0533 04/18/21 0616  BP:  124/75    Pulse:  98 (!) 102 100  Resp:  20 19 18   Temp:  98.5 F (36.9 C)    TempSrc:  Oral    SpO2:  92% 96% 97%  Weight: 76.7 kg      Body mass index is 22.31 kg/m.  Physical Exam Constitutional:      Appearance: He is well-developed.     Comments: Scratching constantly during visit.   Cardiovascular:     Rate and Rhythm:  Normal rate and regular rhythm.  Pulmonary:     Breath sounds: Wheezing and rhonchi (mid/upper lobes) present.     Comments: Turned down to 2 LPM and holding around 92-94% sats.  Abdominal:     General: There is no distension.     Palpations: Abdomen is soft.  Skin:    Findings: Rash (papular raised rash noted scattered over chest, face, upper back, arms. Thickened appearance in some areas wtih some open areas / scratched skin. No signs of secondary bacterial infection) present.  Neurological:     Mental Status: He is alert.    Lab Results Lab Results  Component Value Date   WBC 7.1 04/17/2021   HGB 12.3 (L) 04/17/2021   HCT 37.4 (L) 04/17/2021   MCV 101.9 (H) 04/17/2021   PLT 222 04/17/2021    Lab Results  Component Value Date   CREATININE 1.31 (H) 04/17/2021   BUN 8 04/17/2021   NA 139 04/17/2021   K 3.5 04/17/2021   CL 98 04/17/2021   CO2 30 04/17/2021    Lab Results  Component Value Date   ALT 9 04/17/2021   AST 12 (L) 04/17/2021   ALKPHOS 50 04/17/2021   BILITOT 0.6 04/17/2021     Microbiology: Recent Results (from the past 240 hour(s))  Resp Panel by RT-PCR (Flu A&B, Covid) Nasopharyngeal Swab     Status: None   Collection Time: 04/16/21  8:19 PM   Specimen: Nasopharyngeal Swab; Nasopharyngeal(NP) swabs in vial transport medium  Result Value Ref Range Status   SARS Coronavirus 2 by RT PCR NEGATIVE NEGATIVE Final    Comment: (NOTE) SARS-CoV-2 target nucleic acids are NOT DETECTED.  The SARS-CoV-2 RNA is generally detectable in upper respiratory specimens during the acute phase of infection. The lowest concentration of SARS-CoV-2 viral copies this assay can detect is 138 copies/mL. A negative result does not preclude SARS-Cov-2 infection and should not be used as the sole basis for treatment or other patient management decisions. A negative result may occur with  improper specimen collection/handling, submission of specimen other than nasopharyngeal swab,  presence of viral mutation(s) within the areas targeted by this assay, and inadequate number of viral copies(<138 copies/mL). A negative result must be combined with clinical observations, patient history, and epidemiological information. The expected result is Negative.  Fact Sheet for Patients:  EntrepreneurPulse.com.au  Fact Sheet for Healthcare Providers:  IncredibleEmployment.be  This test is no t yet approved or cleared by the [place] FDA and  has been authorized for detection and/or diagnosis of SARS-CoV-2 by FDA under an Emergency Use Authorization (EUA). This EUA will remain  in effect (meaning this test can be used) for the duration of the COVID-19 declaration under Section 564(b)(1) of the Act, 21 U.S.C.section 360bbb-3(b)(1), unless the authorization is terminated  or revoked sooner.       Influenza A by PCR  NEGATIVE NEGATIVE Final   Influenza B by PCR NEGATIVE NEGATIVE Final    Comment: (NOTE) The Xpert Xpress SARS-CoV-2/FLU/RSV plus assay is intended as an aid in the diagnosis of influenza from Nasopharyngeal swab specimens and should not be used as a sole basis for treatment. Nasal washings and aspirates are unacceptable for Xpert Xpress SARS-CoV-2/FLU/RSV testing.  Fact Sheet for Patients: EntrepreneurPulse.com.au  Fact Sheet for Healthcare Providers: IncredibleEmployment.be  This test is not yet approved or cleared by the [place] FDA and has been authorized for detection and/or diagnosis of SARS-CoV-2 by FDA under an Emergency Use Authorization (EUA). This EUA will remain in effect (meaning this test can be used) for the duration of the COVID-19 declaration under Section 564(b)(1) of the Act, 21 U.S.C. section 360bbb-3(b)(1), unless the authorization is terminated or revoked.  Performed at [facility], [place] [address], [place], [place] [address]   Blood culture  (routine x 2)     Status: None (Preliminary result)   Collection Time: 04/16/21  8:55 PM   Specimen: BLOOD  Result Value Ref Range Status   Specimen Description BLOOD BLOOD LEFT HAND  Final   Special Requests   Final    BOTTLES DRAWN AEROBIC AND ANAEROBIC Blood Culture adequate volume   Culture   Final    NO GROWTH 2 DAYS Performed at [facility], [place] [address] [place], [place] [address]    Report Status PENDING  Incomplete  Blood culture (routine x 2)     Status: None (Preliminary result)   Collection Time: 04/16/21  8:59 PM   Specimen: BLOOD  Result Value Ref Range Status   Specimen Description BLOOD RIGHT ANTECUBITAL  Final   Special Requests   Final    BOTTLES DRAWN AEROBIC AND ANAEROBIC Blood Culture adequate volume   Culture   Final    NO GROWTH 2 DAYS Performed at [facility], [place] [address], [place], [place] [address]    Report Status PENDING  Incomplete    Janene Madeira, MSN, NP-C [facility] [place] Pager: [phone]  04/18/2021  11:07 AM

## 2021-04-18 NOTE — Progress Notes (Signed)
Patient coming from: Home  HPI 51 year old African male who only speaks [name] with a history of HIV, reported history of chronic systolic heart failure with last echo in August 2022 with an EF of 45%, RV dysfunction with reduced EF, history of pneumocystis pneumonia, type 2 diabetes, history of colostomy, history of tuberculosis presents to the ER today with a 1 week history of shortness of breath, cough, fever.  Patient's had a dry cough.  Patient ran out of his Bactrim at least 3 days ago.  Patient states that his been taking his HIV meds.   ED Course: labs unremarkable except for CKD. CXR shows RLL pneumonia   Subjective Still coughing a lot and wheezing Used iPad interpreter  Physical Exam: Vitals:   04/18/21 0500 04/18/21 0519 04/18/21 0533 04/18/21 0616  BP:  124/75    Pulse:  98 (!) 102 100  Resp:  20 19 18   Temp:  98.5 F (36.9 C)    TempSrc:  Oral    SpO2:  92% 96% 97%  Weight: 76.7 kg       Physical Exam Vitals and nursing note reviewed.  Constitutional:      General: He is not in acute distress.    Appearance: He is well-developed.  Cardiovascular:     Rate and Rhythm: Normal rate and regular rhythm.     Heart sounds: Normal heart sounds. No murmur heard.   No friction rub. No gallop.  Pulmonary:     Effort: Pulmonary effort is normal. No respiratory distress.     Breath sounds: Normal breath sounds. No stridor. No wheezing, rhonchi or rales.  Abdominal:     General: Bowel sounds are normal. There is no distension.     Palpations: Abdomen is soft.     Tenderness: There is no abdominal tenderness.  Musculoskeletal:        General: No swelling or tenderness.     Cervical back: Normal range of motion and neck supple.  Skin:    General: Skin is warm and dry.  Neurological:     Mental Status: He is alert.  Psychiatric:        Mood and Affect: Mood normal.        Behavior: Behavior normal.     Labs on Admission: I have personally reviewed following labs  and imaging studies  CBC: Recent Labs  Lab 04/16/21 1954 04/16/21 2108 04/17/21 0416  WBC 8.4  --  7.1  NEUTROABS 5.4  --  4.9  HGB 13.1 13.3 12.3*  HCT 39.1 39.0 37.4*  MCV 101.0*  --  101.9*  PLT 270  --  [id number]    Basic Metabolic Panel: Recent Labs  Lab 04/16/21 1954 04/16/21 2108 04/17/21 0416  NA 137 137 139  K 3.6 3.6 3.5  CL 98 99 98  CO2 29  --  30  GLUCOSE 127* 125* 117*  BUN 9 10 8   CREATININE 1.46* 1.40* 1.31*  CALCIUM 9.2  --  8.8*  MG  --   --  1.7    GFR: Estimated Creatinine Clearance: 73.2 mL/min (A) (by C-G formula based on SCr of 1.31 mg/dL (H)). Liver Function Tests: Recent Labs  Lab 04/16/21 1954 04/17/21 0416  AST 13* 12*  ALT 9 9  ALKPHOS 56 50  BILITOT 0.9 0.6  PROT 8.0 7.0  ALBUMIN 2.7* 2.3*    No results for input(s): LIPASE, AMYLASE in the last 168 hours. No results for input(s): AMMONIA in the last 168 hours. Coagulation  Profile: Recent Labs  Lab 04/16/21 2019  INR 1.2    Cardiac Enzymes: No results for input(s): CKTOTAL, CKMB, CKMBINDEX, TROPONINI in the last 168 hours. BNP (last 3 results) No results for input(s): PROBNP in the last 8760 hours. HbA1C: No results for input(s): HGBA1C in the last 72 hours. CBG: No results for input(s): GLUCAP in the last 168 hours. Lipid Profile: No results for input(s): CHOL, HDL, LDLCALC, TRIG, CHOLHDL, LDLDIRECT in the last 72 hours. Thyroid Function Tests: No results for input(s): TSH, T4TOTAL, FREET4, T3FREE, THYROIDAB in the last 72 hours. Anemia Panel: No results for input(s): VITAMINB12, FOLATE, FERRITIN, TIBC, IRON, RETICCTPCT in the last 72 hours. Urine analysis:    Component Value Date/Time   COLORURINE YELLOW 04/16/2021 [place] 04/16/2021 2345   LABSPEC 1.020 04/16/2021 2345   PHURINE 6.0 04/16/2021 2345   GLUCOSEU NEGATIVE 04/16/2021 2345   HGBUR NEGATIVE 04/16/2021 2345   BILIRUBINUR NEGATIVE 04/16/2021 2345   KETONESUR NEGATIVE 04/16/2021 2345    PROTEINUR NEGATIVE 04/16/2021 2345   UROBILINOGEN 1.0 03/23/2014 1400   NITRITE NEGATIVE 04/16/2021 2345   LEUKOCYTESUR NEGATIVE 04/16/2021 2345    Radiological Exams on Admission: I have personally reviewed images DG Chest Portable 1 View  Result Date: 04/16/2021 CLINICAL DATA:  Cough, shortness of breath EXAM: PORTABLE CHEST 1 VIEW COMPARISON:  Chest x-ray 03/27/2021 FINDINGS: Cardiomediastinal silhouette is grossly unchanged. Heart size within normal limits. Extensive irregular chronic interstitial opacities visualized throughout the left lung and bilateral upper lungs near the apices. Interval development of ground-glass consolidative opacity in the right lower lung zone. No pleural effusion or pneumothorax visualized. IMPRESSION: 1. New consolidation in the right lower lung zone, consistent with pneumonia. 2. Extensive chronic changes in the upper lungs and left lung. Electronically Signed   By: Ofilia Neas M.D.   On: 04/16/2021 20:05    EKG: I have personally reviewed EKG: NSR    Assessment/Plan Principal Problem:   Community acquired pneumonia Active Problems:   HIV (human immunodeficiency virus infection) ([place])   Chronic systolic CHF (congestive heart failure) ([name])   Acute respiratory failure with hypoxia ([name])   Type 2 diabetes mellitus ([name])   History of Pneumocystis jirovecii pneumonia   Stage 3b chronic kidney disease (CKD) ([name]) - baseline SCr 1.39-1.72   History of pulmonary embolus (PE)   S/P colostomy ([name])   Pruritic dermatitis    Assessment and Plan: * Community acquired pneumonia- (present on admission) with hypoxic respiratory failure acute Admit to observation medical bed.  IV Rocephin  Check Legionella and strep pneumo titers.  Pruritic dermatitis- (present on admission) IV benadryl x 1. Prn atarax  S/P colostomy ([name]) Chronic  History of pulmonary embolus (PE)- (present on admission) Not taking any systemic anticoagulants  Stage 3b chronic  kidney disease (CKD) ([name]) - baseline SCr 1.39-1.72- (present on admission) Chronic.  History of Pneumocystis jirovecii pneumonia- (present on admission) Restart Bactrim.  Prophylaxis.  Type 2 diabetes mellitus ([name]) Stable.  Add sliding scale.  Acute respiratory failure with hypoxia ([place])- (present on admission) Continue supplemental oxygen.  Currently sats to 80% on room air at rest talking to me  Chronic systolic CHF (congestive heart failure) ([place])- (present on admission) Euvolemic.  HIV (human immunodeficiency virus infection) ([place])- (present on admission) Continue HIV meds.  Appreciate ID help   Discharge once stable on room air and not requiring supplemental oxygen   Brenn Deziel A, DO [facility] 04/18/2021, 10:24 AM

## 2021-04-18 NOTE — Progress Notes (Signed)
MD Tarry Kos notified of pt sustaining in the 120's with HR

## 2021-04-19 DIAGNOSIS — B20 Human immunodeficiency virus [HIV] disease: Secondary | ICD-10-CM | POA: Diagnosis not present

## 2021-04-19 DIAGNOSIS — J189 Pneumonia, unspecified organism: Secondary | ICD-10-CM | POA: Diagnosis not present

## 2021-04-19 DIAGNOSIS — R0902 Hypoxemia: Secondary | ICD-10-CM

## 2021-04-19 MED ORDER — IPRATROPIUM-ALBUTEROL 0.5-2.5 (3) MG/3ML IN SOLN
3.0000 mL | RESPIRATORY_TRACT | Status: DC
  Administered 2021-04-19 (×3): 3 mL via RESPIRATORY_TRACT
  Filled 2021-04-19 (×3): qty 3

## 2021-04-19 MED ORDER — GUAIFENESIN 100 MG/5ML PO LIQD
5.0000 mL | ORAL | Status: DC
  Administered 2021-04-19 – 2021-04-24 (×26): 5 mL via ORAL
  Filled 2021-04-19 (×23): qty 5

## 2021-04-19 MED ORDER — ALBUTEROL SULFATE (2.5 MG/3ML) 0.083% IN NEBU: 2.5000 mg | INHALATION_SOLUTION | RESPIRATORY_TRACT | Status: DC | PRN

## 2021-04-19 MED ORDER — IPRATROPIUM-ALBUTEROL 0.5-2.5 (3) MG/3ML IN SOLN
3.0000 mL | Freq: Three times a day (TID) | RESPIRATORY_TRACT | Status: DC
  Administered 2021-04-20 – 2021-04-22 (×7): 3 mL via RESPIRATORY_TRACT
  Filled 2021-04-19 (×7): qty 3

## 2021-04-19 NOTE — Progress Notes (Signed)
°   04/19/21 0511  Assess: MEWS Score  Temp 99.1 F (37.3 C)  BP 112/67  Pulse Rate (!) 108  Resp (!) 21  Level of Consciousness Alert  SpO2 91 %  O2 Device Nasal Cannula  O2 Flow Rate (L/min) 5 L/min  Assess: MEWS Score  MEWS Temp 0  MEWS Systolic 0  MEWS Pulse 1  MEWS RR 1  MEWS LOC 0  MEWS Score 2  MEWS Score Color Yellow  Assess: if the MEWS score is Yellow or Red  Were vital signs taken at a resting state? Yes  Focused Assessment No change from prior assessment  Early Detection of Sepsis Score *See Row Information* High  MEWS guidelines implemented *See Row Information* Yes  Treat  MEWS Interventions Administered prn meds/treatments  Pain Scale 0-10  Pain Score 7  Pain Location Back  Pain Orientation Right  Pain Intervention(s) Medication (See eMAR)  Breathing 1  Complains of Coughing;Itching  Interventions Medication (see MAR)  Itching intervention nedication, ointment  Take Vital Signs  Increase Vital Sign Frequency  Yellow: Q 2hr X 2 then Q 4hr X 2, if remains yellow, continue Q 4hrs  Escalate  MEWS: Escalate Yellow: discuss with charge nurse/RN and consider discussing with provider and RRT  Notify: Charge Nurse/RN  Name of Charge Nurse/RN Notified Alona Bene  Date Charge Nurse/RN Notified 04/19/21  Time Charge Nurse/RN Notified 0600

## 2021-04-19 NOTE — Progress Notes (Signed)
°    [facility] for Infectious Disease   Reason for visit: Follow up on pneumonia  Interval History: he remains on oxygen  Physical Exam: Constitutional:  Vitals:   04/19/21 1547 04/19/21 1552  BP:  101/65  Pulse:  85  Resp:  20  Temp:  98.1 F (36.7 C)  SpO2: 91% 96%   patient appears in NAD  Impression: pneumonia, HIV  Plan: He has underlying lung disease and has been wheezing.  Now getting scheduled treatments.   He is on ceftriaxone for pneumonia. Once he is off of oxygen, ok from ID standpoint for discharge and can take 3 days of cefdinir orally after discharge.

## 2021-04-19 NOTE — Plan of Care (Signed)
  Problem: Education: Goal: Knowledge of General Education information will improve Description: Including pain rating scale, medication(s)/side effects and non-pharmacologic comfort measures Outcome: Progressing   Problem: Nutrition: Goal: Adequate nutrition will be maintained Outcome: Progressing   

## 2021-04-19 NOTE — Progress Notes (Signed)
°   04/19/21 0511 04/19/21 0617  Assess: MEWS Score  Temp 99.1 F (37.3 C) 97.8 F (36.6 C)  BP 112/67 131/68  Pulse Rate (!) 108 (!) 105  Resp (!) 21 17  Level of Consciousness Alert Alert  SpO2  --  96 %  O2 Device  --  Nasal Cannula  O2 Flow Rate (L/min)  --  5 L/min  Assess: MEWS Score  MEWS Temp 0 0  MEWS Systolic 0 0  MEWS Pulse 1 1  MEWS RR 1 0  MEWS LOC 0 0  MEWS Score 2 1  MEWS Score Color Yellow Green  Treat  Patients response to intervention  --  Decreased  Document  Patient Outcome  --  Stabilized after interventions  Progress note created (see row info)  --  Yes

## 2021-04-19 NOTE — Progress Notes (Signed)
Patient coming from: Home  HPI 51 year old African male who only speaks [name] with a history of HIV, reported history of chronic systolic heart failure with last echo in August 2022 with an EF of 45%, RV dysfunction with reduced EF, history of pneumocystis pneumonia, type 2 diabetes, history of colostomy, history of tuberculosis presents to the ER today with a 1 week history of shortness of breath, cough, fever.  Patient's had a dry cough.  Patient ran out of his Bactrim at least 3 days ago.  Patient states that his been taking his HIV meds.   ED Course: labs unremarkable except for CKD. CXR shows RLL pneumonia   Subjective Wheezing is better still coughing a lot  Physical Exam: Vitals:   04/19/21 0448 04/19/21 0502 04/19/21 0511 04/19/21 0617  BP: 106/70  112/67 131/68  Pulse: (!) 109  (!) 108 (!) 105  Resp: 20  (!) 21 17  Temp: (!) 100.8 F (38.2 C)  99.1 F (37.3 C) 97.8 F (36.6 C)  TempSrc: Oral  Axillary Oral  SpO2: 94%  91% 96%  Weight:  79 kg      Physical Exam Vitals and nursing note reviewed.  Constitutional:      General: He is not in acute distress.    Appearance: He is well-developed.  Cardiovascular:     Rate and Rhythm: Normal rate and regular rhythm.     Heart sounds: Normal heart sounds. No murmur heard.   No friction rub. No gallop.  Pulmonary:     Effort: Pulmonary effort is normal. No respiratory distress.     Breath sounds: Normal breath sounds. No stridor. No wheezing, rhonchi or rales.  Abdominal:     General: Bowel sounds are normal. There is no distension.     Palpations: Abdomen is soft.     Tenderness: There is no abdominal tenderness.  Musculoskeletal:        General: No swelling or tenderness.     Cervical back: Normal range of motion and neck supple.  Skin:    General: Skin is warm and dry.  Neurological:     Mental Status: He is alert.  Psychiatric:        Mood and Affect: Mood normal.        Behavior: Behavior normal.     Labs  on Admission: I have personally reviewed following labs and imaging studies  CBC: Recent Labs  Lab 04/16/21 1954 04/16/21 2108 04/17/21 0416  WBC 8.4  --  7.1  NEUTROABS 5.4  --  4.9  HGB 13.1 13.3 12.3*  HCT 39.1 39.0 37.4*  MCV 101.0*  --  101.9*  PLT 270  --  [id number]    Basic Metabolic Panel: Recent Labs  Lab 04/16/21 1954 04/16/21 2108 04/17/21 0416  NA 137 137 139  K 3.6 3.6 3.5  CL 98 99 98  CO2 29  --  30  GLUCOSE 127* 125* 117*  BUN 9 10 8   CREATININE 1.46* 1.40* 1.31*  CALCIUM 9.2  --  8.8*  MG  --   --  1.7    GFR: Estimated Creatinine Clearance: 75.4 mL/min (A) (by C-G formula based on SCr of 1.31 mg/dL (H)). Liver Function Tests: Recent Labs  Lab 04/16/21 1954 04/17/21 0416  AST 13* 12*  ALT 9 9  ALKPHOS 56 50  BILITOT 0.9 0.6  PROT 8.0 7.0  ALBUMIN 2.7* 2.3*    No results for input(s): LIPASE, AMYLASE in the last 168 hours. No results for  input(s): AMMONIA in the last 168 hours. Coagulation Profile: Recent Labs  Lab 04/16/21 2019  INR 1.2    Cardiac Enzymes: No results for input(s): CKTOTAL, CKMB, CKMBINDEX, TROPONINI in the last 168 hours. BNP (last 3 results) No results for input(s): PROBNP in the last 8760 hours. HbA1C: No results for input(s): HGBA1C in the last 72 hours. CBG: No results for input(s): GLUCAP in the last 168 hours. Lipid Profile: No results for input(s): CHOL, HDL, LDLCALC, TRIG, CHOLHDL, LDLDIRECT in the last 72 hours. Thyroid Function Tests: No results for input(s): TSH, T4TOTAL, FREET4, T3FREE, THYROIDAB in the last 72 hours. Anemia Panel: No results for input(s): VITAMINB12, FOLATE, FERRITIN, TIBC, IRON, RETICCTPCT in the last 72 hours. Urine analysis:    Component Value Date/Time   COLORURINE YELLOW 04/16/2021 [place] 04/16/2021 2345   LABSPEC 1.020 04/16/2021 2345   PHURINE 6.0 04/16/2021 2345   GLUCOSEU NEGATIVE 04/16/2021 2345   HGBUR NEGATIVE 04/16/2021 2345   BILIRUBINUR NEGATIVE  04/16/2021 2345   KETONESUR NEGATIVE 04/16/2021 2345   PROTEINUR NEGATIVE 04/16/2021 2345   UROBILINOGEN 1.0 03/23/2014 1400   NITRITE NEGATIVE 04/16/2021 2345   LEUKOCYTESUR NEGATIVE 04/16/2021 2345    Radiological Exams on Admission: I have personally reviewed images No results found.  EKG: I have personally reviewed EKG: NSR    Assessment/Plan Principal Problem:   Community acquired pneumonia Active Problems:   HIV (human immunodeficiency virus infection) ([place])   Chronic systolic CHF (congestive heart failure) ([name])   Acute respiratory failure with hypoxia ([name])   Type 2 diabetes mellitus ([name])   History of Pneumocystis jirovecii pneumonia   Stage 3b chronic kidney disease (CKD) ([name]) - baseline SCr 1.39-1.72   History of pulmonary embolus (PE)   S/P colostomy ([name])   Pruritic dermatitis    Assessment and Plan: * Community acquired pneumonia- (present on admission) with hypoxic respiratory failure acute Admit to observation medical bed.  IV Rocephin  Check Legionella and strep pneumo.  Schedule nebs and scheduled cough medicine because he is not asking for, he reports the nurses do not understand him.  Pruritic dermatitis- (present on admission) IV benadryl. Prn atarax  S/P colostomy ([name]) Chronic  History of pulmonary embolus (PE)- (present on admission) Not taking any systemic anticoagulants  Stage 3b chronic kidney disease (CKD) ([name]) - baseline SCr 1.39-1.72- (present on admission) Chronic.  History of Pneumocystis jirovecii pneumonia- (present on admission) Restart Bactrim.  Prophylaxis.  Type 2 diabetes mellitus ([name]) Stable.  Add sliding scale.  Acute respiratory failure with hypoxia ([place])- (present on admission) Continue supplemental oxygen.  Currently sats to 80% on room air at rest talking to me  Chronic systolic CHF (congestive heart failure) ([place])- (present on admission) Euvolemic.  HIV (human immunodeficiency virus infection) ([place])- (present  on admission) Continue HIV meds.  Appreciate ID help   Discharge once stable on room air and not requiring supplemental oxygen   Nijah Orlich A, DO [facility] 04/19/2021, 12:07 PM

## 2021-04-20 LAB — BASIC METABOLIC PANEL
Anion gap: 10 (ref 5–15)
BUN: 13 mg/dL (ref 6–20)
CO2: 32 mmol/L (ref 22–32)
Calcium: 9.5 mg/dL (ref 8.9–10.3)
Chloride: 95 mmol/L — ABNORMAL LOW (ref 98–111)
Creatinine, Ser: 1.12 mg/dL (ref 0.61–1.24)
GFR, Estimated: >60 mL/min (ref >60)
Glucose, Bld: 132 mg/dL — ABNORMAL HIGH (ref 70–99)
Potassium: 3.7 mmol/L (ref 3.5–5.1)
Sodium: 137 mmol/L (ref 135–145)

## 2021-04-20 LAB — CBC WITH DIFFERENTIAL/PLATELET
Abs Immature Granulocytes: 0.03 10*3/uL (ref 0.00–0.07)
Basophils Absolute: 0 10*3/uL (ref 0.0–0.1)
Basophils Relative: 0 %
Eosinophils Absolute: 0.5 10*3/uL (ref 0.0–0.5)
Eosinophils Relative: 8 %
HCT: 37 % — ABNORMAL LOW (ref 39.0–52.0)
Hemoglobin: 12.2 g/dL — ABNORMAL LOW (ref 13.0–17.0)
Immature Granulocytes: 1 %
Lymphocytes Relative: 14 %
Lymphs Abs: 0.8 10*3/uL (ref 0.7–4.0)
MCH: 33.5 pg (ref 26.0–34.0)
MCHC: 33 g/dL (ref 30.0–36.0)
MCV: 101.6 fL — ABNORMAL HIGH (ref 80.0–100.0)
Monocytes Absolute: 0.6 10*3/uL (ref 0.1–1.0)
Monocytes Relative: 11 %
Neutro Abs: 3.8 10*3/uL (ref 1.7–7.7)
Neutrophils Relative %: 66 %
Platelets: 310 10*3/uL (ref 150–400)
RBC: 3.64 MIL/uL — ABNORMAL LOW (ref 4.22–5.81)
RDW: 12 % (ref 11.5–15.5)
WBC: 5.8 10*3/uL (ref 4.0–10.5)
nRBC: 0 % (ref 0.0–0.2)

## 2021-04-20 NOTE — Plan of Care (Signed)
°  Problem: Activity: Goal: Ability to tolerate increased activity will improve Outcome: Progressing   Problem: Clinical Measurements: Goal: Ability to maintain a body temperature in the normal range will improve Outcome: Progressing   Problem: Respiratory: Goal: Ability to maintain a clear airway will improve Outcome: Progressing   Problem: Respiratory: Goal: Ability to maintain adequate ventilation will improve Outcome: Not Progressing  Supplemental O2 increase to 5L d/t pt satting 87-88%

## 2021-04-20 NOTE — Progress Notes (Signed)
Patient coming from: Home  HPI 51 year old African male who only speaks [name] with a history of HIV, reported history of chronic systolic heart failure with last echo in August 2022 with an EF of 45%, RV dysfunction with reduced EF, history of pneumocystis pneumonia, type 2 diabetes, history of colostomy, history of tuberculosis presents to the ER today with a 1 week history of shortness of breath, cough, fever.  Patient's had a dry cough.  Patient ran out of his Bactrim at least 3 days ago.  Patient states that his been taking his HIV meds.   ED Course: labs unremarkable except for CKD. CXR shows RLL pneumonia   Subjective Wheezing is better coughing better with scheduled cough meds and albuterol  Physical Exam: Vitals:   04/19/21 2039 04/20/21 0449 04/20/21 0812 04/20/21 0900  BP: (!) 136/106 115/73  116/68  Pulse: 92 (!) 101    Resp:      Temp: 98.6 F (37 C) 98.4 F (36.9 C)  98.3 F (36.8 C)  TempSrc: Oral Oral  Oral  SpO2: 100% 93% 94%   Weight:        Physical Exam Vitals and nursing note reviewed.  Constitutional:      General: He is not in acute distress.    Appearance: He is well-developed.  Cardiovascular:     Rate and Rhythm: Normal rate and regular rhythm.     Heart sounds: Normal heart sounds. No murmur heard.   No friction rub. No gallop.  Pulmonary:     Effort: Pulmonary effort is normal. No respiratory distress.     Breath sounds: Normal breath sounds. No stridor. No wheezing, rhonchi or rales.  Abdominal:     General: Bowel sounds are normal. There is no distension.     Palpations: Abdomen is soft.     Tenderness: There is no abdominal tenderness.  Musculoskeletal:        General: No swelling or tenderness.     Cervical back: Normal range of motion and neck supple.  Skin:    General: Skin is warm and dry.  Neurological:     Mental Status: He is alert.  Psychiatric:        Mood and Affect: Mood normal.        Behavior: Behavior normal.      Labs on Admission: I have personally reviewed following labs and imaging studies  CBC: Recent Labs  Lab 04/16/21 1954 04/16/21 2108 04/17/21 0416 04/20/21 0945  WBC 8.4  --  7.1 5.8  NEUTROABS 5.4  --  4.9 3.8  HGB 13.1 13.3 12.3* 12.2*  HCT 39.1 39.0 37.4* 37.0*  MCV 101.0*  --  101.9* 101.6*  PLT 270  --  222 [id number]    Basic Metabolic Panel: Recent Labs  Lab 04/16/21 1954 04/16/21 2108 04/17/21 0416 04/20/21 0945  NA 137 137 139 137  K 3.6 3.6 3.5 3.7  CL 98 99 98 95*  CO2 29  --  30 32  GLUCOSE 127* 125* 117* 132*  BUN 9 10 8 13   CREATININE 1.46* 1.40* 1.31* 1.12  CALCIUM 9.2  --  8.8* 9.5  MG  --   --  1.7  --     GFR: Estimated Creatinine Clearance: 88.2 mL/min (by C-G formula based on SCr of 1.12 mg/dL). Liver Function Tests: Recent Labs  Lab 04/16/21 1954 04/17/21 0416  AST 13* 12*  ALT 9 9  ALKPHOS 56 50  BILITOT 0.9 0.6  PROT 8.0 7.0  ALBUMIN  2.7* 2.3*    No results for input(s): LIPASE, AMYLASE in the last 168 hours. No results for input(s): AMMONIA in the last 168 hours. Coagulation Profile: Recent Labs  Lab 04/16/21 2019  INR 1.2    Cardiac Enzymes: No results for input(s): CKTOTAL, CKMB, CKMBINDEX, TROPONINI in the last 168 hours. BNP (last 3 results) No results for input(s): PROBNP in the last 8760 hours. HbA1C: No results for input(s): HGBA1C in the last 72 hours. CBG: No results for input(s): GLUCAP in the last 168 hours. Lipid Profile: No results for input(s): CHOL, HDL, LDLCALC, TRIG, CHOLHDL, LDLDIRECT in the last 72 hours. Thyroid Function Tests: No results for input(s): TSH, T4TOTAL, FREET4, T3FREE, THYROIDAB in the last 72 hours. Anemia Panel: No results for input(s): VITAMINB12, FOLATE, FERRITIN, TIBC, IRON, RETICCTPCT in the last 72 hours. Urine analysis:    Component Value Date/Time   COLORURINE YELLOW 04/16/2021 [place] 04/16/2021 2345   LABSPEC 1.020 04/16/2021 2345   PHURINE 6.0 04/16/2021  2345   GLUCOSEU NEGATIVE 04/16/2021 2345   HGBUR NEGATIVE 04/16/2021 2345   BILIRUBINUR NEGATIVE 04/16/2021 2345   KETONESUR NEGATIVE 04/16/2021 2345   PROTEINUR NEGATIVE 04/16/2021 2345   UROBILINOGEN 1.0 03/23/2014 1400   NITRITE NEGATIVE 04/16/2021 2345   LEUKOCYTESUR NEGATIVE 04/16/2021 2345    Radiological Exams on Admission: I have personally reviewed images No results found.  EKG: I have personally reviewed EKG: NSR    Assessment/Plan Principal Problem:   Community acquired pneumonia Active Problems:   HIV (human immunodeficiency virus infection) ([place])   Chronic systolic CHF (congestive heart failure) ([name])   Acute respiratory failure with hypoxia ([name])   Type 2 diabetes mellitus ([name])   History of Pneumocystis jirovecii pneumonia   Stage 3b chronic kidney disease (CKD) ([name]) - baseline SCr 1.39-1.72   History of pulmonary embolus (PE)   S/P colostomy ([name])   Pruritic dermatitis    Assessment and Plan: * Community acquired pneumonia- (present on admission) with hypoxic respiratory failure acute Admit to observation medical bed.  IV Rocephin  Check Legionella and strep pneumo.  Schedule nebs and scheduled cough medicine because he is not asking for, he reports the nurses do not understand him.  Pruritic dermatitis- (present on admission) IV benadryl. Prn atarax  S/P colostomy ([name]) Chronic  History of pulmonary embolus (PE)- (present on admission) Not taking any systemic anticoagulants  Stage 3b chronic kidney disease (CKD) ([name]) - baseline SCr 1.39-1.72- (present on admission) Chronic.  History of Pneumocystis jirovecii pneumonia- (present on admission) Restart Bactrim.  Prophylaxis.  Type 2 diabetes mellitus ([name]) Stable.  Add sliding scale.  Acute respiratory failure with hypoxia ([place])- (present on admission) Continue supplemental oxygen.  Currently sats to 80% on room air at rest talking to me  Chronic systolic CHF (congestive heart failure) ([place])-  (present on admission) Euvolemic.  HIV (human immunodeficiency virus infection) ([place])- (present on admission) Continue HIV meds.  Appreciate ID help   Discharge once stable on room air and not requiring supplemental oxygen.  Slowly improving.   Avaya Mcjunkins A, DO [facility] 04/20/2021, 11:03 AM

## 2021-04-20 NOTE — Plan of Care (Signed)
  Problem: Activity: Goal: Ability to tolerate increased activity will improve Outcome: Progressing   Problem: Clinical Measurements: Goal: Ability to maintain a body temperature in the normal range will improve Outcome: Progressing   Problem: Respiratory: Goal: Ability to maintain adequate ventilation will improve Outcome: Progressing Goal: Ability to maintain a clear airway will improve Outcome: Progressing   

## 2021-04-21 DIAGNOSIS — J189 Pneumonia, unspecified organism: Secondary | ICD-10-CM | POA: Diagnosis not present

## 2021-04-21 DIAGNOSIS — B20 Human immunodeficiency virus [HIV] disease: Secondary | ICD-10-CM | POA: Diagnosis not present

## 2021-04-21 DIAGNOSIS — R0902 Hypoxemia: Secondary | ICD-10-CM | POA: Diagnosis not present

## 2021-04-21 LAB — CBC WITH DIFFERENTIAL/PLATELET
Abs Immature Granulocytes: 0.05 10*3/uL (ref 0.00–0.07)
Basophils Absolute: 0 10*3/uL (ref 0.0–0.1)
Basophils Relative: 0 %
Eosinophils Absolute: 0.4 10*3/uL (ref 0.0–0.5)
Eosinophils Relative: 8 %
HCT: 39.3 % (ref 39.0–52.0)
Hemoglobin: 12.7 g/dL — ABNORMAL LOW (ref 13.0–17.0)
Immature Granulocytes: 1 %
Lymphocytes Relative: 19 %
Lymphs Abs: 1 10*3/uL (ref 0.7–4.0)
MCH: 33.1 pg (ref 26.0–34.0)
MCHC: 32.3 g/dL (ref 30.0–36.0)
MCV: 102.3 fL — ABNORMAL HIGH (ref 80.0–100.0)
Monocytes Absolute: 0.7 10*3/uL (ref 0.1–1.0)
Monocytes Relative: 13 %
Neutro Abs: 3.2 10*3/uL (ref 1.7–7.7)
Neutrophils Relative %: 59 %
Platelets: 353 10*3/uL (ref 150–400)
RBC: 3.84 MIL/uL — ABNORMAL LOW (ref 4.22–5.81)
RDW: 12 % (ref 11.5–15.5)
WBC: 5.4 10*3/uL (ref 4.0–10.5)
nRBC: 0 % (ref 0.0–0.2)

## 2021-04-21 LAB — BASIC METABOLIC PANEL
Anion gap: 11 (ref 5–15)
BUN: 16 mg/dL (ref 6–20)
CO2: 30 mmol/L (ref 22–32)
Calcium: 9.4 mg/dL (ref 8.9–10.3)
Chloride: 93 mmol/L — ABNORMAL LOW (ref 98–111)
Creatinine, Ser: 1.36 mg/dL — ABNORMAL HIGH (ref 0.61–1.24)
GFR, Estimated: >60 mL/min (ref >60)
Glucose, Bld: 93 mg/dL (ref 70–99)
Potassium: 4.3 mmol/L (ref 3.5–5.1)
Sodium: 134 mmol/L — ABNORMAL LOW (ref 135–145)

## 2021-04-21 LAB — CULTURE, BLOOD (ROUTINE X 2)
Culture: NO GROWTH
Culture: NO GROWTH
Special Requests: ADEQUATE
Special Requests: ADEQUATE

## 2021-04-21 MED ORDER — PREDNISONE 20 MG PO TABS
40.0000 mg | ORAL_TABLET | Freq: Every day | ORAL | Status: DC
  Administered 2021-04-21 – 2021-04-22 (×2): 40 mg via ORAL
  Filled 2021-04-21 (×2): qty 2

## 2021-04-21 NOTE — Progress Notes (Signed)
[facility]   Reason for visit: Follow up on pneumonia  Interval History: he remains on oxygen.  Itching is better with topical therapy.  He has been getting his nebulizer treatment.  He remains afebrile and WBC wnl Day 6 total antibiotics  Physical Exam: Constitutional:  Vitals:   04/21/21 0824 04/21/21 0825  BP: 112/69   Pulse:    Resp: 20   Temp: 98.2 F (36.8 C)   SpO2:  90%   patient appears in NAD Eyes: anicteric HENT: +nasal cannula Respiratory: Normal respiratory effort; CTA B Cardiovascular: RRR   Review of Systems: Constitutional: negative for fevers and chills Respiratory: positive for cough or wheezing, negative for dyspnea on exertion Integument/breast: positive for pruritus  Lab Results  Component Value Date   WBC 5.4 04/21/2021   HGB 12.7 (L) 04/21/2021   HCT 39.3 04/21/2021   MCV 102.3 (H) 04/21/2021   PLT 353 04/21/2021    Lab Results  Component Value Date   CREATININE 1.36 (H) 04/21/2021   BUN 16 04/21/2021   NA 134 (L) 04/21/2021   K 4.3 04/21/2021   CL 93 (L) 04/21/2021   CO2 30 04/21/2021    Lab Results  Component Value Date   ALT 9 04/17/2021   AST 12 (L) 04/17/2021   ALKPHOS 50 04/17/2021     Microbiology: Recent Results (from the past 240 hour(s))  Resp Panel by RT-PCR (Flu A&B, Covid) Nasopharyngeal Swab     Status: None   Collection Time: 04/16/21  8:19 PM   Specimen: Nasopharyngeal Swab; Nasopharyngeal(NP) swabs in vial transport medium  Result Value Ref Range Status   SARS Coronavirus 2 by RT PCR NEGATIVE NEGATIVE Final    Comment: (NOTE) SARS-CoV-2 target nucleic acids are NOT DETECTED.  The SARS-CoV-2 RNA is generally detectable in upper respiratory specimens during the acute phase of infection. The lowest concentration of SARS-CoV-2 viral copies this assay can detect is 138 copies/mL. A negative result does not preclude SARS-Cov-2 infection and should not be used as the sole basis for  treatment or other patient management decisions. A negative result may occur with  improper specimen collection/handling, submission of specimen other than nasopharyngeal swab, presence of viral mutation(s) within the areas targeted by this assay, and inadequate number of viral copies(<138 copies/mL). A negative result must be combined with clinical observations, patient history, and epidemiological information. The expected result is Negative.  Fact Sheet for Patients:  BloggerCourse.com  Fact Sheet for Healthcare Providers:  SeriousBroker.it  This test is no t yet approved or cleared by the [place] FDA and  has been authorized for detection and/or diagnosis of SARS-CoV-2 by FDA under an Emergency Use Authorization (EUA). This EUA will remain  in effect (meaning this test can be used) for the duration of the COVID-19 declaration under Section 564(b)(1) of the Act, 21 U.S.C.section 360bbb-3(b)(1), unless the authorization is terminated  or revoked sooner.       Influenza A by PCR NEGATIVE NEGATIVE Final   Influenza B by PCR NEGATIVE NEGATIVE Final    Comment: (NOTE) The Xpert Xpress SARS-CoV-2/FLU/RSV plus assay is intended as an aid in the diagnosis of influenza from Nasopharyngeal swab specimens and should not be used as a sole basis for treatment. Nasal washings and aspirates are unacceptable for Xpert Xpress SARS-CoV-2/FLU/RSV testing.  Fact Sheet for Patients: BloggerCourse.com  Fact Sheet for Healthcare Providers: SeriousBroker.it  This test is not yet approved or cleared by the [place] and  has been authorized for detection and/or diagnosis of SARS-CoV-2 by FDA under an Emergency Use Authorization (EUA). This EUA will remain in effect (meaning this test can be used) for the duration of the COVID-19 declaration under Section 564(b)(1) of the Act, 21  U.S.C. section 360bbb-3(b)(1), unless the authorization is terminated or revoked.  Performed at [facility], [address], [place], [place] [address]   Blood culture (routine x 2)     Status: None   Collection Time: 04/16/21  8:55 PM   Specimen: BLOOD  Result Value Ref Range Status   Specimen Description BLOOD BLOOD LEFT HAND  Final   Special Requests   Final    BOTTLES DRAWN AEROBIC AND ANAEROBIC Blood Culture adequate volume   Culture   Final    NO GROWTH 5 DAYS Performed at [facility], [address], [place], [place] [address]    Report Status 04/21/2021 FINAL  Final  Blood culture (routine x 2)     Status: None   Collection Time: 04/16/21  8:59 PM   Specimen: BLOOD  Result Value Ref Range Status   Specimen Description BLOOD RIGHT ANTECUBITAL  Final   Special Requests   Final    BOTTLES DRAWN AEROBIC AND ANAEROBIC Blood Culture adequate volume   Culture   Final    NO GROWTH 5 DAYS Performed at [facility], [address], [place], [place] [address]    Report Status 04/21/2021 FINAL  Final    Impression/Plan:  1. Persistent hypoxia - he has pneumonia but this has improved.  He continues though to have a significant oxygen requirement and he has underlying lung disease, though not clearly worked out.   At this point, he has been on nebulized treatment, incentive spirometry and continues to remain hypoxic.  Certainly his poor immune system can be contributing but he may benefit from a pulmonary consultation.   Could also be from his heart failure but clinically does not appear overloaded.    2.  HIV - he remains on his salvage regimen with Symtuza and Tivicay and he will continue.  Most recent viral load in November pretty well suppressed at just 50 copies.    3.  Opportunistic infection risk - he is on Bactrim and will continue for PJP prophylaxis.

## 2021-04-21 NOTE — Progress Notes (Signed)
Mobility Specialist Progress Note    04/21/21 1708  Mobility  Activity Ambulated with assistance in hallway  Level of Assistance Standby assist, set-up cues, supervision of patient - no hands on  Assistive Device Front wheel walker  Distance Ambulated (ft) 500 ft  Activity Response Tolerated fair  $Mobility charge 1 Mobility    Pre-Mobility: 98 HR, 91% SpO2  Pt received in bed and agreeable. Ambulated on 3LO2 and SpO2 remained around 87-89%. Pt had no complaints. Upon return, set patient up at sink for wash-up. RN and NT aware.   [facility] Mobility Specialist  M.S. 5N: [phone]

## 2021-04-21 NOTE — Progress Notes (Signed)
Patient coming from: Home  HPI 51 year old African male who only speaks [name] with a history of HIV, reported history of chronic systolic heart failure with last echo in August 2022 with an EF of 45%, RV dysfunction with reduced EF, history of pneumocystis pneumonia, type 2 diabetes, history of colostomy, history of tuberculosis presents to the ER today with a 1 week history of shortness of breath, cough, fever.  Patient's had a dry cough.  Patient ran out of his Bactrim at least 3 days ago.  Patient states that his been taking his HIV meds.   ED Course: labs unremarkable except for CKD. CXR shows RLL pneumonia   Subjective Wheezing is better coughing better with scheduled cough meds and albuterol  Physical Exam: Vitals:   04/20/21 2030 04/21/21 0754 04/21/21 0824 04/21/21 0825  BP:   112/69   Pulse:  92    Resp:  (!) 23 20   Temp:   98.2 F (36.8 C)   TempSrc:   Oral   SpO2: 95% 97%  90%  Weight:        Physical Exam Vitals and nursing note reviewed.  Constitutional:      General: He is not in acute distress.    Appearance: He is well-developed.  Cardiovascular:     Rate and Rhythm: Normal rate and regular rhythm.     Heart sounds: Normal heart sounds. No murmur heard.   No friction rub. No gallop.  Pulmonary:     Effort: Pulmonary effort is normal. No respiratory distress.     Breath sounds: Normal breath sounds. No stridor. No wheezing, rhonchi or rales.  Abdominal:     General: Bowel sounds are normal. There is no distension.     Palpations: Abdomen is soft.     Tenderness: There is no abdominal tenderness.  Musculoskeletal:        General: No swelling or tenderness.     Cervical back: Normal range of motion and neck supple.  Skin:    General: Skin is warm and dry.  Neurological:     Mental Status: He is alert.  Psychiatric:        Mood and Affect: Mood normal.        Behavior: Behavior normal.     Labs on Admission: I have personally reviewed following  labs and imaging studies  CBC: Recent Labs  Lab 04/16/21 1954 04/16/21 2108 04/17/21 0416 04/20/21 0945 04/21/21 0245  WBC 8.4  --  7.1 5.8 5.4  NEUTROABS 5.4  --  4.9 3.8 3.2  HGB 13.1 13.3 12.3* 12.2* 12.7*  HCT 39.1 39.0 37.4* 37.0* 39.3  MCV 101.0*  --  101.9* 101.6* 102.3*  PLT 270  --  222 310 0000000    Basic Metabolic Panel: Recent Labs  Lab 04/16/21 1954 04/16/21 2108 04/17/21 0416 04/20/21 0945 04/21/21 0245  NA 137 137 139 137 134*  K 3.6 3.6 3.5 3.7 4.3  CL 98 99 98 95* 93*  CO2 29  --  30 32 30  GLUCOSE 127* 125* 117* 132* 93  BUN 9 10 8 13 16   CREATININE 1.46* 1.40* 1.31* 1.12 1.36*  CALCIUM 9.2  --  8.8* 9.5 9.4  MG  --   --  1.7  --   --     GFR: Estimated Creatinine Clearance: 72.6 mL/min (A) (by C-G formula based on SCr of 1.36 mg/dL (H)). Liver Function Tests: Recent Labs  Lab 04/16/21 1954 04/17/21 0416  AST 13* 12*  ALT  9 9  ALKPHOS 56 50  BILITOT 0.9 0.6  PROT 8.0 7.0  ALBUMIN 2.7* 2.3*    No results for input(s): LIPASE, AMYLASE in the last 168 hours. No results for input(s): AMMONIA in the last 168 hours. Coagulation Profile: Recent Labs  Lab 04/16/21 2019  INR 1.2    Cardiac Enzymes: No results for input(s): CKTOTAL, CKMB, CKMBINDEX, TROPONINI in the last 168 hours. BNP (last 3 results) No results for input(s): PROBNP in the last 8760 hours. HbA1C: No results for input(s): HGBA1C in the last 72 hours. CBG: No results for input(s): GLUCAP in the last 168 hours. Lipid Profile: No results for input(s): CHOL, HDL, LDLCALC, TRIG, CHOLHDL, LDLDIRECT in the last 72 hours. Thyroid Function Tests: No results for input(s): TSH, T4TOTAL, FREET4, T3FREE, THYROIDAB in the last 72 hours. Anemia Panel: No results for input(s): VITAMINB12, FOLATE, FERRITIN, TIBC, IRON, RETICCTPCT in the last 72 hours. Urine analysis:    Component Value Date/Time   COLORURINE YELLOW 04/16/2021 [place] 04/16/2021 2345   LABSPEC 1.020  04/16/2021 2345   PHURINE 6.0 04/16/2021 2345   GLUCOSEU NEGATIVE 04/16/2021 2345   HGBUR NEGATIVE 04/16/2021 2345   BILIRUBINUR NEGATIVE 04/16/2021 2345   KETONESUR NEGATIVE 04/16/2021 2345   PROTEINUR NEGATIVE 04/16/2021 2345   UROBILINOGEN 1.0 03/23/2014 1400   NITRITE NEGATIVE 04/16/2021 2345   LEUKOCYTESUR NEGATIVE 04/16/2021 2345    Radiological Exams on Admission: I have personally reviewed images No results found.  EKG: I have personally reviewed EKG: NSR    Assessment/Plan Principal Problem:   Community acquired pneumonia Active Problems:   HIV (human immunodeficiency virus infection) ([place])   Chronic systolic CHF (congestive heart failure) ([name])   Acute respiratory failure with hypoxia ([name])   Type 2 diabetes mellitus ([name])   History of Pneumocystis jirovecii pneumonia   Stage 3b chronic kidney disease (CKD) ([name]) - baseline SCr 1.39-1.72   History of pulmonary embolus (PE)   S/P colostomy ([name])   Pruritic dermatitis    Assessment and Plan: * Community acquired pneumonia- (present on admission) with hypoxic respiratory failure acute Admit to medical bed.  IV Rocephin Legionella and strep negative.  Schedule nebs and scheduled cough medicine because he is not asking for, he reports the nurses do not understand him.  Start a short burst of steroids.  Pruritic dermatitis- (present on admission) IV benadryl. Prn atarax  S/P colostomy ([name]) Chronic  History of pulmonary embolus (PE)- (present on admission) Not taking any systemic anticoagulants  Stage 3b chronic kidney disease (CKD) ([name]) - baseline SCr 1.39-1.72- (present on admission) Chronic.  History of Pneumocystis jirovecii pneumonia- (present on admission) Restart Bactrim.  Prophylaxis.  Type 2 diabetes mellitus ([name]) Stable.  Add sliding scale.  Acute respiratory failure with hypoxia ([place])- (present on admission) Continue supplemental oxygen.  Currently sats to 80% on room air at rest talking to  me  Chronic systolic CHF (congestive heart failure) ([place])- (present on admission) Euvolemic.  HIV (human immunodeficiency virus infection) ([place])- (present on admission) Continue HIV meds.  Appreciate ID help   Discharge once stable on room air and not requiring supplemental oxygen.  Slow to improve Have utilized interpreter for [name] daily  Johnette Teigen A, DO [facility] 04/21/2021, 9:45 AM

## 2021-04-22 ENCOUNTER — Inpatient Hospital Stay (HOSPITAL_COMMUNITY): Payer: BC Managed Care – PPO

## 2021-04-22 DIAGNOSIS — J479 Bronchiectasis, uncomplicated: Secondary | ICD-10-CM

## 2021-04-22 DIAGNOSIS — A150 Tuberculosis of lung: Secondary | ICD-10-CM

## 2021-04-22 DIAGNOSIS — J189 Pneumonia, unspecified organism: Secondary | ICD-10-CM | POA: Diagnosis not present

## 2021-04-22 HISTORY — DX: Bronchiectasis, uncomplicated: J47.9

## 2021-04-22 LAB — CBC WITH DIFFERENTIAL/PLATELET
Abs Immature Granulocytes: 0.04 10*3/uL (ref 0.00–0.07)
Basophils Absolute: 0 10*3/uL (ref 0.0–0.1)
Basophils Relative: 0 %
Eosinophils Absolute: 0 10*3/uL (ref 0.0–0.5)
Eosinophils Relative: 0 %
HCT: 40.2 % (ref 39.0–52.0)
Hemoglobin: 13 g/dL (ref 13.0–17.0)
Immature Granulocytes: 1 %
Lymphocytes Relative: 16 %
Lymphs Abs: 1 10*3/uL (ref 0.7–4.0)
MCH: 32.7 pg (ref 26.0–34.0)
MCHC: 32.3 g/dL (ref 30.0–36.0)
MCV: 101.3 fL — ABNORMAL HIGH (ref 80.0–100.0)
Monocytes Absolute: 0.6 10*3/uL (ref 0.1–1.0)
Monocytes Relative: 10 %
Neutro Abs: 4.4 10*3/uL (ref 1.7–7.7)
Neutrophils Relative %: 73 %
Platelets: 430 10*3/uL — ABNORMAL HIGH (ref 150–400)
RBC: 3.97 MIL/uL — ABNORMAL LOW (ref 4.22–5.81)
RDW: 12.1 % (ref 11.5–15.5)
WBC: 6.1 10*3/uL (ref 4.0–10.5)
nRBC: 0 % (ref 0.0–0.2)

## 2021-04-22 LAB — BASIC METABOLIC PANEL
Anion gap: 11 (ref 5–15)
BUN: 21 mg/dL — ABNORMAL HIGH (ref 6–20)
CO2: 32 mmol/L (ref 22–32)
Calcium: 10.3 mg/dL (ref 8.9–10.3)
Chloride: 94 mmol/L — ABNORMAL LOW (ref 98–111)
Creatinine, Ser: 1.27 mg/dL — ABNORMAL HIGH (ref 0.61–1.24)
GFR, Estimated: >60 mL/min (ref >60)
Glucose, Bld: 136 mg/dL — ABNORMAL HIGH (ref 70–99)
Potassium: 5.4 mmol/L — ABNORMAL HIGH (ref 3.5–5.1)
Sodium: 137 mmol/L (ref 135–145)

## 2021-04-22 MED ORDER — SODIUM ZIRCONIUM CYCLOSILICATE 10 G PO PACK
10.0000 g | PACK | Freq: Once | ORAL | Status: AC
  Administered 2021-04-22: 10 g via ORAL
  Filled 2021-04-22: qty 1

## 2021-04-22 MED ORDER — IPRATROPIUM-ALBUTEROL 0.5-2.5 (3) MG/3ML IN SOLN
3.0000 mL | Freq: Two times a day (BID) | RESPIRATORY_TRACT | Status: DC
  Administered 2021-04-22 – 2021-04-23 (×2): 3 mL via RESPIRATORY_TRACT
  Filled 2021-04-22 (×2): qty 3

## 2021-04-22 NOTE — Consult Note (Addendum)
NAME:  [name], MRN:  [id number], DOB:  1971/03/14, LOS: 4 ADMISSION DATE:  04/16/2021, CONSULTATION DATE:  2/7 REFERRING MD:  [place], CHIEF COMPLAINT:  pneumonia not getting better    History of Present Illness:  51 year old male patient who was initially admitted on 2/1 with chief complaint of cough, headache, and shortness of breath evolving over approximately 1 week of time.  Has a known history of HIV, with fairly significant pulmonary involvement as mentioned below.  Was on Bactrim for PCP prophylaxis, apparently ran out of that about 3 days prior to presentation.   Portable chest x-ray showed bilateral airspace disease With new right basilar consolidation and chronic left upper lobe changes.  He was admitted with a working diagnosis of community-acquired pneumonia, started on azithromycin and ceftriaxone.  He completed 5 days of ceftriaxone.  During this time infectious disease was consulted on 2/2.  They felt presentation and findings consistent with pneumonia and not PCP.  Urine strep and Legionella both negative.  Respiratory viral panel was negative for influenza and COVID.  His hospital course has been complicated by significant wheezing, and ongoing supplemental oxygen need. Pulmonary asked to see on 2/7 for ongoing hypoxia, continued wheezing, and unresolving pneumonia. Pertinent  Medical History  Systolic heart failure with EF 45%, right ventricular dysfunction  HIV with prior noncompliance History of tuberculosis History of bronchiectasis History of pneumocystis History of PE/DVT 2017 History of colostomy  Significant Hospital Events: Including procedures, antibiotic start and stop dates in addition to other pertinent events   2/1 admitted with working diagnosis of pneumonia.  Started on ceftriaxone and azithromycin 2/2: Urine strep and Legionella antigen negative.  Azithromycin stopped.  Infectious disease consulted.  Felt not consistent with PCP.  Recommended CAP  coverage.  Interim History / Subjective:  Feels a little better   Objective   Blood pressure 107/77, pulse 91, temperature 98 F (36.7 C), temperature source Oral, resp. rate 18, weight 79 kg, SpO2 98 %.        Intake/Output Summary (Last 24 hours) at 04/22/2021 1353 Last data filed at 04/22/2021 0900 Gross per 24 hour  Intake 240 ml  Output 1950 ml  Net -1710 ml   Filed Weights   04/18/21 0500 04/19/21 0502  Weight: 76.7 kg 79 kg    Examination: General: 51 year old male resting in bed. No distress HENT: NCAT MMM posterior pharynx is erythremic   Lungs: decreased. No wheezing. No accessory use. Currently on 4 lpm  Cardiovascular: RRR Abdomen: soft not tender  Extremities: trace LE edema  Neuro: awake and alert. No focal def  GU: voids   Resolved Hospital Problem list     Assessment & Plan:  Principal Problem:   Community acquired pneumonia Active Problems:   HIV (human immunodeficiency virus infection) ([place])   Chronic systolic CHF (congestive heart failure) ([name])   Acute respiratory failure with hypoxia ([name])   Type 2 diabetes mellitus ([place])   History of tuberculosis   History of Pneumocystis jirovecii pneumonia   Stage 3b chronic kidney disease (CKD) ([name]) - baseline SCr 1.39-1.72   History of pulmonary embolus (PE)   S/P colostomy ([name])   Pruritic dermatitis   Bronchiectasis, tuberculous, bacteriologic/histologic exam unknown  Acute hypoxic respiratory failure 2/2 RLL airspace disease c/w CAP superimposed on h/o bronchiectasis w/ remote tuberculosis and PCP infection in pt w/ HIV  (immunocompromised state) Clinically feeling better, no longer wheezing and cough has improved. Still however has sig O2 needs Has completed abx  Plan Given fairly significant bronchiectasis might need to consider prolonged abx course CT chest (high resolution)  Will proceed w/ FOB as high risk for opportunistic and resistant infection  He has no h/o smoking but earlier had sig c/o  wheezing. May be more airway inflammation from PNA than fixed airway obstruction but reasonable to consider out-pt PFTs. He may indeed have some sig obstructive airway disease given the degree of his bronchiectasis   Best Practice (right click and "Reselect all SmartList Selections" daily)   Per primary   Labs   CBC: Recent Labs  Lab 04/16/21 1954 04/16/21 2108 04/17/21 0416 04/20/21 0945 04/21/21 0245 04/22/21 0348  WBC 8.4  --  7.1 5.8 5.4 6.1  NEUTROABS 5.4  --  4.9 3.8 3.2 4.4  HGB 13.1 13.3 12.3* 12.2* 12.7* 13.0  HCT 39.1 39.0 37.4* 37.0* 39.3 40.2  MCV 101.0*  --  101.9* 101.6* 102.3* 101.3*  PLT 270  --  222 310 353 430*    Basic Metabolic Panel: Recent Labs  Lab 04/16/21 1954 04/16/21 2108 04/17/21 0416 04/20/21 0945 04/21/21 0245 04/22/21 0348  NA 137 137 139 137 134* 137  K 3.6 3.6 3.5 3.7 4.3 5.4*  CL 98 99 98 95* 93* 94*  CO2 29  --  30 32 30 32  GLUCOSE 127* 125* 117* 132* 93 136*  BUN 9 10 8 13 16  21*  CREATININE 1.46* 1.40* 1.31* 1.12 1.36* 1.27*  CALCIUM 9.2  --  8.8* 9.5 9.4 10.3  MG  --   --  1.7  --   --   --    GFR: Estimated Creatinine Clearance: 77.8 mL/min (A) (by C-G formula based on SCr of 1.27 mg/dL (H)). Recent Labs  Lab 04/16/21 1953 04/16/21 1954 04/17/21 0416 04/20/21 0945 04/21/21 0245 04/22/21 0348  WBC  --    < > 7.1 5.8 5.4 6.1  LATICACIDVEN 0.9  --  0.7  --   --   --    < > = values in this interval not displayed.    Liver Function Tests: Recent Labs  Lab 04/16/21 1954 04/17/21 0416  AST 13* 12*  ALT 9 9  ALKPHOS 56 50  BILITOT 0.9 0.6  PROT 8.0 7.0  ALBUMIN 2.7* 2.3*   No results for input(s): LIPASE, AMYLASE in the last 168 hours. No results for input(s): AMMONIA in the last 168 hours.  ABG    Component Value Date/Time   PHART 7.450 04/23/2016 2319   PCO2ART 35.3 04/23/2016 2319   PO2ART 65.0 (L) 04/23/2016 2319   HCO3 25.1 11/01/2020 1719   TCO2 30 04/16/2021 2108   O2SAT 94.0 11/01/2020 1719      Coagulation Profile: Recent Labs  Lab 04/16/21 2019  INR 1.2    Cardiac Enzymes: No results for input(s): CKTOTAL, CKMB, CKMBINDEX, TROPONINI in the last 168 hours.  HbA1C: Hgb A1c MFr Bld  Date/Time Value Ref Range Status  11/01/2020 04:56 PM 6.0 (H) 4.8 - 5.6 % Final    Comment:    (NOTE) Pre diabetes:          5.7%-6.4%  Diabetes:              >6.4%  Glycemic control for   <7.0% adults with diabetes   05/17/2019 12:03 PM 5.4 4.8 - 5.6 % Final    Comment:    (NOTE) Pre diabetes:          5.7%-6.4% Diabetes:              >  6.4% Glycemic control for   <7.0% adults with diabetes     CBG: No results for input(s): GLUCAP in the last 168 hours.  Review of Systems:   Review of Systems  Constitutional:  Positive for malaise/fatigue. Negative for fever.  HENT:  Positive for congestion, sinus pain and sore throat.   Eyes: Negative.   Respiratory:  Positive for cough, sputum production, shortness of breath and wheezing.   Cardiovascular:  Positive for chest pain.  Gastrointestinal: Negative.   Genitourinary: Negative.   Musculoskeletal: Negative.   Skin:  Positive for itching.  Neurological: Negative.   Endo/Heme/Allergies: Negative.   Psychiatric/Behavioral: Negative.      Past Medical History:  He,  has a past medical history of Acute pulmonary embolism ([place]) (11/01/2020), Acute pulmonary embolus ([place]) (02/24/2016), Bronchiectasis with acute exacerbation ([place]), Depression, Diabetes mellitus without complication ([place]), DVT (deep venous thrombosis) ([place]) (11/03/2020), Dyspnea, Genital warts (01/04/2017), Hepatitis, HIV disease ([place]), MSSA bacteremia, 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[place]) (04/24/2016), 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[place]), and TB (pulmonary tuberculosis).   Surgical History:   Past Surgical History:  Procedure Laterality Date   FRACTURE SURGERY     IM NAILING TIBIA Right 03/21/2014   ORIF ANKLE FRACTURE Right  03/21/2014   lateral malleolus/notes 03/21/2014   ORIF ANKLE FRACTURE Right 03/21/2014   Procedure: OPEN REDUCTION INTERNAL FIXATION (ORIF) pilon ;  Surgeon: Marybelle Killings, MD;  Location: [place];  Service: Orthopedics;  Laterality: Right;   TEE WITHOUT CARDIOVERSION N/A 05/23/2019   Procedure: TRANSESOPHAGEAL ECHOCARDIOGRAM (TEE);  Surgeon: Skeet Latch, MD;  Location: [place];  Service: Cardiovascular;  Laterality: N/A;   TIBIA IM NAIL INSERTION Right 03/21/2014   Procedure: INTRAMEDULLARY (IM) NAIL TIBIAL;  Surgeon: Marybelle Killings, MD;  Location: [facility];  Service: Orthopedics;  Laterality: Right;   VIDEO BRONCHOSCOPY Bilateral 03/02/2016   Procedure: VIDEO BRONCHOSCOPY WITHOUT FLUORO;  Surgeon: Javier Glazier, MD;  Location: [place];  Service: Cardiopulmonary;  Laterality: Bilateral;     Social History:   reports that he quit smoking about 20 years ago. His smoking use included cigarettes. He has a 3.00 pack-year smoking history. He has never used smokeless tobacco. He reports current alcohol use. He reports that he does not use drugs.   Family History:  His family history includes Heart disease in his sister; Hypertension in an other family member.   Allergies Allergies  Allergen Reactions   Metformin And Related Swelling and Rash     Home Medications  Prior to Admission medications   Medication Sig Start Date End Date Taking? Authorizing Provider  albuterol (VENTOLIN HFA) 108 (90 Base) MCG/ACT inhaler Inhale 2 puffs into the lungs every 6 (six) hours as needed for wheezing or shortness of breath. 03/27/21  Yes Prosperi, Christian H, PA-C  Darunavir-Cobicistat-Emtricitabine-Tenofovir Alafenamide (SYMTUZA) 800-150-200-10 MG TABS Take 1 tablet by mouth daily with breakfast. 02/10/21  Yes Ghimire, Henreitta Leber, MD  hydrOXYzine (ATARAX) 25 MG tablet Take 1 tablet (25 mg total) by mouth every 6 (six) hours for 20 days. For itching 04/03/21 04/23/21 Yes [place] Callas, NP  azithromycin  (ZITHROMAX) 600 MG tablet Take 2 tablets (1,200 mg total) by mouth every 7 (seven) days. Patient not taking: Reported on 04/03/2021 02/10/21   Jonetta Osgood, MD  benzonatate (TESSALON) 200 MG capsule Take 1 capsule (200 mg total) by mouth 3 (three) times daily as needed for cough. Patient not taking: Reported on 03/13/2021 02/10/21   Oren Binet  M, MD  Clobetasol Propionate (CLOBETASOL 17 PROPIONATE) 0.5 % POWD 1 application by Does not apply route in the morning and at bedtime. Do not apply to face, armpits, groins. Limit use to 2 weeks maximum in any one area Patient not taking: Reported on 04/03/2021 03/27/21   Prosperi, Christian H, PA-C  dolutegravir (TIVICAY) 50 MG tablet Take 1 tablet (50 mg total) by mouth daily. Patient not taking: Reported on 04/16/2021 02/10/21   Jonetta Osgood, MD  enoxaparin (LOVENOX) 120 MG/0.8ML injection Inject 1 syringe (120 mg total) into the skin daily. Patient not taking: Reported on 04/16/2021 02/10/21   Jonetta Osgood, MD  famotidine (PEPCID) 40 MG tablet Take 1 tablet (40 mg total) by mouth daily. Patient not taking: Reported on 04/16/2021 04/03/21   [place] Callas, NP  fluconazole (DIFLUCAN) 150 MG tablet Take 2 tablets (300 mg total) by mouth once a week for 6 doses. Patient not taking: Reported on 04/16/2021 04/03/21 05/09/21  [place] Callas, NP  guaiFENesin (MUCINEX) 600 MG 12 hr tablet Take 1 tablet (600 mg total) by mouth 2 (two) times daily. Patient not taking: Reported on 03/13/2021 02/10/21   Jonetta Osgood, MD  predniSONE (DELTASONE) 50 MG tablet Take 1 tablet (50 mg total) by mouth daily. Patient not taking: Reported on 04/03/2021 03/27/21   Prosperi, Christian H, PA-C  sulfamethoxazole-trimethoprim (BACTRIM DS) 800-160 MG tablet Take 1 tablet by mouth daily. Patient not taking: Reported on 04/03/2021 02/10/21   Jonetta Osgood, MD  Triamcinolone Acetonide (TRIAMCINOLONE 0.1 % CREAM : EUCERIN) CREA Apply 1 application topically 3  (three) times daily as needed. Patient not taking: Reported on 04/03/2021 02/27/21   [place] Callas, NP     Erick Colace ACNP-BC [place] Pager # [phone] OR # [phone] if no answer

## 2021-04-22 NOTE — Consult Note (Incomplete)
NAME:  [name], MRN:  [id number], DOB:  Aug 21, 1970, LOS: 4 ADMISSION DATE:  04/16/2021, CONSULTATION DATE:  *** REFERRING MD:  ***, CHIEF COMPLAINT:  ***   History of Present Illness:  51yoM who only speaks [name] initially admitted via ED on 04/16/21 with c/o feeling increasingly sick over the past 1 mos. He described having body aches, cough, headache and has been coughing more over the past 3 weeks. Past 1 week prior to admission patient described becoming much more ill with history of shortness of breath, productive cough and generalized not feeling well. Patient ran out of Bactrim 3 days prior to presentation to the ED   Pertinent  Medical History  HIV (poor medication compliance) underlying bronchiectasis with history tuberculosis, pneumocystis pneumonia, DM type II, chronic systolic heart failure, DVT/PE, colostomy  Significant Hospital Events: Including procedures, antibiotic start and stop dates in addition to other pertinent events   2/7 PCCM consulted re: increasing hypoxia and oxygen demand  Interim History / Subjective:  Alert and oriented x4. Resting comfortably in bed. No acute distress or SOB noted.   Objective   Blood pressure 107/77, pulse 91, temperature 98 F (36.7 C), temperature source Oral, resp. rate 18, weight 79 kg, SpO2 98 %.        Intake/Output Summary (Last 24 hours) at 04/22/2021 1257 Last data filed at 04/22/2021 0900 Gross per 24 hour  Intake 240 ml  Output 1950 ml  Net -1710 ml   Filed Weights   04/18/21 0500 04/19/21 0502  Weight: 76.7 kg 79 kg    Examination: General: 51yo male. Resting comfortable in bed. No acute distress or SOB HENT: NCAT. No JVD Lungs: clear throughout. Diminished in the bases Cardiovascular: RRR. No ectopy Abdomen: soft nontender Extremities: warm and dry Neuro: alert and oriented GU: voids  Resolved Hospital Problem list     Assessment & Plan:  Principal Problem:   Community acquired pneumonia Active  Problems:   HIV (human immunodeficiency virus infection) ([place])   Chronic systolic CHF (congestive heart failure) ([name])   Acute respiratory failure with hypoxia ([name])   Type 2 diabetes mellitus ([place])   History of Pneumocystis jirovecii pneumonia   Stage 3b chronic kidney disease (CKD) ([name]) - baseline SCr 1.39-1.72   History of pulmonary embolus (PE)   S/P colostomy ([name])   Pruritic dermatitis   Acute hypoxic respiratory failure likely secondary to CAP Plan  chest CT scan  Diagnostic bronchioalveolar lavage (BAL) Continue supplemental oxygen PRN Infectious disease following ABX Rocephin completed Albuterol and [name] PRN Continue cardiac monitoring with continuous pulse ox  Chronic systolic congestive heart failure. Echo on 11/02/20: EF 45-50%. Left ventricle with mildly decreased function. Plan Diuresis PRN to maintain euvolemic Monitor for lower extremity swelling Strict Intake and Output monitoring Supplemental oxygen PRN Daily weights  History of Pneumocystic jirovecii pneumonia  HIV with underlying bronchiectasis  Present on admission Plan  Consider restarting Bactrim prophylaxis ID following Supportive therapy Continue HIV medications  CKD stage 3B. Baseline sCr 1.67-1.22. sCr. 2/7 1.27 Plan Continue to trend sCr Avoid nephrotoxic agents Consider renal dosing medications Avoid IV contrast if CT scan done  Best Practice (right click and "Reselect all SmartList Selections" daily)   Diet/type: Regular consistency (see orders) DVT prophylaxis: prophylactic heparin  GI prophylaxis: N/A Lines: N/A Foley:  N/A Code Status:  full code Last date of multidisciplinary goals of care discussion _0   Labs   CBC: Recent Labs  Lab 04/16/21 1954 04/16/21 2108 04/17/21 0416 04/20/21  0945 04/21/21 0245 04/22/21 0348  WBC 8.4  --  7.1 5.8 5.4 6.1  NEUTROABS 5.4  --  4.9 3.8 3.2 4.4  HGB 13.1 13.3 12.3* 12.2* 12.7* 13.0  HCT 39.1 39.0 37.4* 37.0* 39.3 40.2  MCV  101.0*  --  101.9* 101.6* 102.3* 101.3*  PLT 270  --  222 310 353 430*    Basic Metabolic Panel: Recent Labs  Lab 04/16/21 1954 04/16/21 2108 04/17/21 0416 04/20/21 0945 04/21/21 0245 04/22/21 0348  NA 137 137 139 137 134* 137  K 3.6 3.6 3.5 3.7 4.3 5.4*  CL 98 99 98 95* 93* 94*  CO2 29  --  30 32 30 32  GLUCOSE 127* 125* 117* 132* 93 136*  BUN _0 21*  CREATININE 1.46* 1.40* 1.31* 1.12 1.36* 1.27*  CALCIUM 9.2  --  8.8* 9.5 9.4 10.3  MG  --   --  1.7  --   --   --    GFR: Estimated Creatinine Clearance: 77.8 mL/min (A) (by C-G formula based on SCr of 1.27 mg/dL (H)). Recent Labs  Lab 04/16/21 1953 04/16/21 1954 04/17/21 0416 04/20/21 0945 04/21/21 0245 04/22/21 0348  WBC  --    < > 7.1 5.8 5.4 6.1  LATICACIDVEN 0.9  --  0.7  --   --   --    < > = values in this interval not displayed.    Liver Function Tests: Recent Labs  Lab 04/16/21 1954 04/17/21 0416  AST 13* 12*  ALT 9 9  ALKPHOS 56 50  BILITOT 0.9 0.6  PROT 8.0 7.0  ALBUMIN 2.7* 2.3*   No results for input(s): LIPASE, AMYLASE in the last 168 hours. No results for input(s): AMMONIA in the last 168 hours.  ABG    Component Value Date/Time   PHART 7.450 04/23/2016 2319   PCO2ART 35.3 04/23/2016 2319   PO2ART 65.0 (L) 04/23/2016 2319   HCO3 25.1 11/01/2020 1719   TCO2 30 04/16/2021 2108   O2SAT 94.0 11/01/2020 1719     Coagulation Profile: Recent Labs  Lab 04/16/21 2019  INR 1.2    Cardiac Enzymes: No results for input(s): CKTOTAL, CKMB, CKMBINDEX, TROPONINI in the last 168 hours.  HbA1C: Hgb A1c MFr Bld  Date/Time Value Ref Range Status  11/01/2020 04:56 PM 6.0 (H) 4.8 - 5.6 % Final    Comment:    (NOTE) Pre diabetes:          5.7%-6.4%  Diabetes:              >6.4%  Glycemic control for   <7.0% adults with diabetes   05/17/2019 12:03 PM 5.4 4.8 - 5.6 % Final    Comment:    (NOTE) Pre diabetes:          5.7%-6.4% Diabetes:              >6.4% Glycemic control for    <7.0% adults with diabetes     CBG: No results for input(s): GLUCAP in the last 168 hours.  Review of Systems:   ***  Past Medical History:  He,  has a past medical history of Acute pulmonary embolism ([place]) (11/01/2020), Acute pulmonary embolus ([place]) (02/24/2016), Bronchiectasis with acute exacerbation ([place]), Depression, Diabetes mellitus without complication ([place]), DVT (deep venous thrombosis) ([place]) (11/03/2020), Dyspnea, Genital warts (01/04/2017), Hepatitis, HIV disease ([place]), MSSA bacteremia, 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[place]) (04/24/2016), 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[place]), and TB (  pulmonary tuberculosis).   Surgical History:   Past Surgical History:  Procedure Laterality Date   FRACTURE SURGERY     IM NAILING TIBIA Right 03/21/2014   ORIF ANKLE FRACTURE Right 03/21/2014   lateral malleolus/notes 03/21/2014   ORIF ANKLE FRACTURE Right 03/21/2014   Procedure: OPEN REDUCTION INTERNAL FIXATION (ORIF) pilon ;  Surgeon: Marybelle Killings, MD;  Location: [facility];  Service: Orthopedics;  Laterality: Right;   TEE WITHOUT CARDIOVERSION N/A 05/23/2019   Procedure: TRANSESOPHAGEAL ECHOCARDIOGRAM (TEE);  Surgeon: Skeet Latch, MD;  Location: [facility];  Service: Cardiovascular;  Laterality: N/A;   TIBIA IM NAIL INSERTION Right 03/21/2014   Procedure: INTRAMEDULLARY (IM) NAIL TIBIAL;  Surgeon: Marybelle Killings, MD;  Location: [facility];  Service: Orthopedics;  Laterality: Right;   VIDEO BRONCHOSCOPY Bilateral 03/02/2016   Procedure: VIDEO BRONCHOSCOPY WITHOUT FLUORO;  Surgeon: Javier Glazier, MD;  Location: [facility];  Service: Cardiopulmonary;  Laterality: Bilateral;     Social History:   reports that he quit smoking about 20 years ago. His smoking use included cigarettes. He has a 3.00 pack-year smoking history. He has never used smokeless tobacco. He reports current alcohol use. He reports that he does not use drugs.   Family History:  His family history  includes Heart disease in his sister; Hypertension in an other family member.   Allergies Allergies  Allergen Reactions   Metformin And Related Swelling and Rash     Home Medications  Prior to Admission medications   Medication Sig Start Date End Date Taking? Authorizing Provider  albuterol (VENTOLIN HFA) 108 (90 Base) MCG/ACT inhaler Inhale 2 puffs into the lungs every 6 (six) hours as needed for wheezing or shortness of breath. 03/27/21  Yes Prosperi, Christian H, PA-C  Darunavir-Cobicistat-Emtricitabine-Tenofovir Alafenamide (SYMTUZA) 800-150-200-10 MG TABS Take 1 tablet by mouth daily with breakfast. 02/10/21  Yes Ghimire, Henreitta Leber, MD  hydrOXYzine (ATARAX) 25 MG tablet Take 1 tablet (25 mg total) by mouth every 6 (six) hours for 20 days. For itching 04/03/21 04/23/21 Yes [place] Callas, NP  azithromycin (ZITHROMAX) 600 MG tablet Take 2 tablets (1,200 mg total) by mouth every 7 (seven) days. Patient not taking: Reported on 04/03/2021 02/10/21   Jonetta Osgood, MD  benzonatate (TESSALON) 200 MG capsule Take 1 capsule (200 mg total) by mouth 3 (three) times daily as needed for cough. Patient not taking: Reported on 03/13/2021 02/10/21   Jonetta Osgood, MD  Clobetasol Propionate (CLOBETASOL 17 PROPIONATE) 0.5 % POWD 1 application by Does not apply route in the morning and at bedtime. Do not apply to face, armpits, groins. Limit use to 2 weeks maximum in any one area Patient not taking: Reported on 04/03/2021 03/27/21   Prosperi, Christian H, PA-C  dolutegravir (TIVICAY) 50 MG tablet Take 1 tablet (50 mg total) by mouth daily. Patient not taking: Reported on 04/16/2021 02/10/21   Jonetta Osgood, MD  enoxaparin (LOVENOX) 120 MG/0.8ML injection Inject 1 syringe (120 mg total) into the skin daily. Patient not taking: Reported on 04/16/2021 02/10/21   Jonetta Osgood, MD  famotidine (PEPCID) 40 MG tablet Take 1 tablet (40 mg total) by mouth daily. Patient not taking: Reported on  04/16/2021 04/03/21   [place] Callas, NP  fluconazole (DIFLUCAN) 150 MG tablet Take 2 tablets (300 mg total) by mouth once a week for 6 doses. Patient not taking: Reported on 04/16/2021 04/03/21 05/09/21  [place] Callas, NP  guaiFENesin (MUCINEX) 600 MG 12 hr tablet Take 1  tablet (600 mg total) by mouth 2 (two) times daily. Patient not taking: Reported on 03/13/2021 02/10/21   Jonetta Osgood, MD  predniSONE (DELTASONE) 50 MG tablet Take 1 tablet (50 mg total) by mouth daily. Patient not taking: Reported on 04/03/2021 03/27/21   Prosperi, Christian H, PA-C  sulfamethoxazole-trimethoprim (BACTRIM DS) 800-160 MG tablet Take 1 tablet by mouth daily. Patient not taking: Reported on 04/03/2021 02/10/21   Jonetta Osgood, MD  Triamcinolone Acetonide (TRIAMCINOLONE 0.1 % CREAM : EUCERIN) CREA Apply 1 application topically 3 (three) times daily as needed. Patient not taking: Reported on 04/03/2021 02/27/21   [place] Callas, NP     Critical care time:

## 2021-04-22 NOTE — Progress Notes (Signed)
PROGRESS NOTE  [name]  DOB: 29-Dec-1970  PCP: Aviva Kluver GQQ:[id number]  DOA: 04/16/2021  LOS: 4 days  Hospital Day: 7  Chief Complaint  Patient presents with   Cough   Headache   Shortness of Breath   Brief narrative: [name] is a 51 y.o. male who only speaks [name] with PMH significant for HIV, poor compliance to meds, underlying bronchiectasis with history of tuberculosis, history of pneumocystis pneumonia, reported history of chronic systolic heart failure with last echo in August 2022 with an EF of 45%, RV dysfunction with reduced EF, type 2 diabetes, DVT/PE (2017), history of colostomy. Patient presented to the ED on 04/16/2021 with complaint of 1 week history of shortness of breath, productive cough and generalized not feeling well for several days.  Patient states he ran out of Bactrim 3 days prior to presentation.  Chest x-ray on admission showed right lower lobe opacity Admitted to hospital service. ID was consulted.  Subjective: Patient was seen and examined this morning.  Middle-aged [place] American male. [place] proficiency. Continues to remain on 2 L oxygen by nasal cannula. No family at bedside  Assessment/Plan: Acute hypoxic respiratory failure -Patient continues to require 3 L oxygen by nasal cannula at rest. -He has acute pneumonia.  Also has chronic pulm issues that include bronchiectasis due to history of tuberculosis, pneumocystis pneumonia and mildly elevated pulmonary artery pressure. -It seems he has not had evaluation by pulmonology in the past. -Pulmonary consult called today. -Continue inhalers, Mucinex.   Community acquired pneumonia -Chest x-ray on admission with right lower lobe obesity -Completed 5-day course of IV Rocephin. -No fever, WC count normal  HIV (human immunodeficiency virus infection) ([name])- (present on admission) -Patient remains on his salvage regimen with Symtuza and Tivicay.   -Per ID, most recent viral  load in November 2022 was pretty well suppressed at just 50 copies. -Currently on Bactrim for PCP prophylaxis.  Pruritus -Itching better with topical therapy.  Also on Benadryl as needed  Chronic systolic CHF -Last echo from August 2022 with EF 45 to 50%, LV global hypokinesis, mildly elevated pulmonary artery pressure to 35. -Heart rate and blood pressure stable.  I do not see any beta-blocker ACE/ARB or diuretics  Type 2 diabetes mellitus -Seems diet controlled at this time.  CKD 3B -Creatinine stable. Recent Labs    02/07/21 0357 02/08/21 0127 02/09/21 0054 03/27/21 1202 04/16/21 1954 04/16/21 2108 04/17/21 0416 04/20/21 0945 04/21/21 0245 04/22/21 0348  BUN 13 19 35* 19 9 10 8 13 16  21*  CREATININE 1.22 1.36* 1.36* 1.72* 1.46* 1.40* 1.31* 1.12 1.36* 1.27*   Hyperkalemia -Potassium level elevated 5.4 this morning -1 dose of Lokelma given.   -Continue to monitor. Recent Labs  Lab 04/16/21 2108 04/17/21 0416 04/20/21 0945 04/21/21 0245 04/22/21 0348  K 3.6 3.5 3.7 4.3 5.4*  MG  --  1.7  --   --   --    History of DVT PE in 2017 -Seem to have completed a course of Eliquis.  S/P colostomy ([name]) Chronic    Mobility: Encourage ambulation Goals of care   Code Status: Full Code    Nutritional status:  Body mass index is 22.98 kg/m.      Diet:  Diet Order             Diet regular Room service appropriate? Yes with Assist; Fluid consistency: Thin  Diet effective now  Wounds:  -  DVT prophylaxis:  heparin injection 5,000 Units Start: 04/17/21 0000 SCDs Start: 04/16/21 2346   Antimicrobials: HIV meds Fluid: None Consultants: ID, pulm called Family Communication: None at bedside  Status is: Inpatient  Continue in-hospital care because: Continues to remain hypoxic Level of care: Med-Surg   Dispo: The patient is from: Home              Anticipated d/c is to: Hopefully home in next 2 to 3 days              Patient  currently is not medically stable to d/c.   Difficult to place patient No     Infusions:    Scheduled Meds:  Darunavir-Cobicistat-Emtricitabine-Tenofovir Alafenamide  1 tablet Oral Q breakfast   dolutegravir  50 mg Oral Daily   guaiFENesin  5 mL Oral Q4H   heparin  5,000 Units Subcutaneous Q8H   ipratropium-albuterol  3 mL Nebulization TID   triamcinolone 0.1 % cream : eucerin   Topical TID    PRN meds: acetaminophen **OR** acetaminophen, albuterol, diphenhydrAMINE, guaiFENesin-dextromethorphan, hydrOXYzine, ondansetron **OR** ondansetron (ZOFRAN) IV   Antimicrobials: Anti-infectives (From admission, onward)    Start     Dose/Rate Route Frequency Ordered Stop   04/17/21 1000  dolutegravir (TIVICAY) tablet 50 mg        50 mg Oral Daily 04/16/21 2345     04/17/21 1000  sulfamethoxazole-trimethoprim (BACTRIM DS) 800-160 MG per tablet 1 tablet  Status:  Discontinued        1 tablet Oral Daily 04/16/21 2345 04/16/21 2349   04/17/21 1000  sulfamethoxazole-trimethoprim (BACTRIM DS) 800-160 MG per tablet 1 tablet  Status:  Discontinued        1 tablet Oral Daily 04/17/21 0816 04/22/21 1010   04/17/21 0800  Darunavir-Cobicistat-Emtricitabine-Tenofovir Alafenamide (SYMTUZA) 800-150-200-10 MG TABS 1 tablet        1 tablet Oral Daily with breakfast 04/16/21 2345     04/16/21 2030  cefTRIAXone (ROCEPHIN) 2 g in sodium chloride 0.9 % 100 mL IVPB        2 g 200 mL/hr over 30 Minutes Intravenous Every 24 hours 04/16/21 2019 04/21/21 0031   04/16/21 2030  azithromycin (ZITHROMAX) 500 mg in sodium chloride 0.9 % 250 mL IVPB  Status:  Discontinued        500 mg 250 mL/hr over 60 Minutes Intravenous Every 24 hours 04/16/21 2019 04/17/21 1627       Objective: Vitals:   04/22/21 0355 04/22/21 0844  BP: 107/77   Pulse: 91   Resp: 18   Temp: 98 F (36.7 C)   SpO2: 98% 98%    Intake/Output Summary (Last 24 hours) at 04/22/2021 1235 Last data filed at 04/22/2021 0900 Gross per 24 hour   Intake 240 ml  Output 1950 ml  Net -1710 ml   Filed Weights   04/18/21 0500 04/19/21 0502  Weight: 76.7 kg 79 kg   Weight change:  Body mass index is 22.98 kg/m.   Physical Exam: General exam: Thin built young African-American male. Skin: No rashes, lesions or ulcers. HEENT: Atraumatic, normocephalic, no obvious bleeding Lungs: Clear to auscultation bilaterally except for mild wheezing CVS: Regular rate and rhythm, no murmur GI/Abd soft, nontender, nondistended, bowel sound present CNS: Alert, awake, oriented x3 Psychiatry: Mood appropriate Extremities: No pedal edema, no calf tenderness  Data Review: I have personally reviewed the laboratory data and studies available.  F/u labs  Unresulted Labs (From admission, onward)  Start     Ordered   04/22/21 1049  Pneumocystis smear by DFA  Once,   R        04/22/21 1048   04/20/21 0803  CBC with Differential/Platelet  Daily,   R     Question:  Specimen collection method  Answer:  Lab=Lab collect   04/20/21 0802   04/20/21 0803  Basic metabolic panel  Daily,   R     Question:  Specimen collection method  Answer:  Lab=Lab collect   04/20/21 0802            Signed, Lorin Glass, MD [facility] 04/22/2021

## 2021-04-22 NOTE — Progress Notes (Signed)
Mobility Specialist: Progress Note   04/22/21 1720  Mobility  Activity Ambulated with assistance in hallway  Level of Assistance Modified independent, requires aide device or extra time  Assistive Device Front wheel walker  Distance Ambulated (ft) 500 ft  Activity Response Tolerated well  $Mobility charge 1 Mobility   Pre-Mobility: 88 HR, 95% SpO2  Received pt in bed having no complaints and agreeable to mobility. Ambulated on 3 L/min [place]. Asymptomatic throughout ambulation, returned back to bed w/ call bell in reach and all needs met.  [employer] [name] Mobility Specialist Mobility Specialist 5 North: [phone] Mobility Specialist 6 North: [phone]

## 2021-04-23 ENCOUNTER — Encounter (HOSPITAL_COMMUNITY): Payer: Self-pay | Admitting: Internal Medicine

## 2021-04-23 ENCOUNTER — Inpatient Hospital Stay (HOSPITAL_COMMUNITY): Payer: BC Managed Care – PPO | Admitting: Anesthesiology

## 2021-04-23 ENCOUNTER — Encounter (HOSPITAL_COMMUNITY)
Admission: EM | Disposition: A | Discharge status: Home / Self Care | Payer: Self-pay | Source: Home / Self Care | Attending: Family Medicine

## 2021-04-23 ENCOUNTER — Other Ambulatory Visit (HOSPITAL_COMMUNITY): Payer: Self-pay

## 2021-04-23 DIAGNOSIS — J189 Pneumonia, unspecified organism: Secondary | ICD-10-CM | POA: Diagnosis not present

## 2021-04-23 HISTORY — PX: BRONCHIAL WASHINGS: SHX5105

## 2021-04-23 HISTORY — PX: VIDEO BRONCHOSCOPY: SHX5072

## 2021-04-23 LAB — BASIC METABOLIC PANEL
Anion gap: 12 (ref 5–15)
BUN: 27 mg/dL — ABNORMAL HIGH (ref 6–20)
CO2: 31 mmol/L (ref 22–32)
Calcium: 10.1 mg/dL (ref 8.9–10.3)
Chloride: 91 mmol/L — ABNORMAL LOW (ref 98–111)
Creatinine, Ser: 1.3 mg/dL — ABNORMAL HIGH (ref 0.61–1.24)
GFR, Estimated: >60 mL/min (ref >60)
Glucose, Bld: 199 mg/dL — ABNORMAL HIGH (ref 70–99)
Potassium: 4.5 mmol/L (ref 3.5–5.1)
Sodium: 134 mmol/L — ABNORMAL LOW (ref 135–145)

## 2021-04-23 LAB — CBC WITH DIFFERENTIAL/PLATELET
Abs Immature Granulocytes: 0.08 10*3/uL — ABNORMAL HIGH (ref 0.00–0.07)
Basophils Absolute: 0 10*3/uL (ref 0.0–0.1)
Basophils Relative: 0 %
Eosinophils Absolute: 0 10*3/uL (ref 0.0–0.5)
Eosinophils Relative: 0 %
HCT: 42 % (ref 39.0–52.0)
Hemoglobin: 13.6 g/dL (ref 13.0–17.0)
Immature Granulocytes: 1 %
Lymphocytes Relative: 13 %
Lymphs Abs: 1 10*3/uL (ref 0.7–4.0)
MCH: 33.1 pg (ref 26.0–34.0)
MCHC: 32.4 g/dL (ref 30.0–36.0)
MCV: 102.2 fL — ABNORMAL HIGH (ref 80.0–100.0)
Monocytes Absolute: 0.6 10*3/uL (ref 0.1–1.0)
Monocytes Relative: 8 %
Neutro Abs: 5.6 10*3/uL (ref 1.7–7.7)
Neutrophils Relative %: 78 %
Platelets: 488 10*3/uL — ABNORMAL HIGH (ref 150–400)
RBC: 4.11 MIL/uL — ABNORMAL LOW (ref 4.22–5.81)
RDW: 12 % (ref 11.5–15.5)
WBC: 7.2 10*3/uL (ref 4.0–10.5)
nRBC: 0 % (ref 0.0–0.2)

## 2021-04-23 LAB — BODY FLUID CELL COUNT WITH DIFFERENTIAL
Eos, Fluid: 0 %
Eos, Fluid: 3 %
Lymphs, Fluid: 1 %
Lymphs, Fluid: 4 %
Monocyte-Macrophage-Serous Fluid: 0 % — ABNORMAL LOW (ref 50–90)
Monocyte-Macrophage-Serous Fluid: 3 % — ABNORMAL LOW (ref 50–90)
Neutrophil Count, Fluid: 99 % — ABNORMAL HIGH (ref 0–25)
Neutrophil Count, Fluid: 90 % — ABNORMAL HIGH (ref 0–25)
Total Nucleated Cell Count, Fluid: UNDETERMINED cu mm (ref 0–1000)
Total Nucleated Cell Count, Fluid: 4200 cu mm — ABNORMAL HIGH (ref 0–1000)

## 2021-04-23 LAB — GLUCOSE, CAPILLARY: Glucose-Capillary: 124 mg/dL — ABNORMAL HIGH (ref 70–99)

## 2021-04-23 SURGERY — VIDEO BRONCHOSCOPY WITHOUT FLUORO
Anesthesia: General | Laterality: Left

## 2021-04-23 MED ORDER — ONDANSETRON HCL 4 MG/2ML IJ SOLN: 4.0000 mg | Freq: Once | INTRAMUSCULAR | Status: DC | PRN

## 2021-04-23 MED ORDER — MIDAZOLAM HCL 2 MG/2ML IJ SOLN
INTRAMUSCULAR | Status: DC | PRN
Start: 2021-04-23 — End: 2021-04-23
  Administered 2021-04-23: 1 mg via INTRAVENOUS

## 2021-04-23 MED ORDER — LIDOCAINE 2% (20 MG/ML) 5 ML SYRINGE
INTRAMUSCULAR | Status: DC | PRN
  Administered 2021-04-23: 100 mg via INTRAVENOUS

## 2021-04-23 MED ORDER — DEXAMETHASONE SODIUM PHOSPHATE 10 MG/ML IJ SOLN
INTRAMUSCULAR | Status: DC | PRN
Start: 2021-04-23 — End: 2021-04-23
  Administered 2021-04-23: 5 mg via INTRAVENOUS

## 2021-04-23 MED ORDER — ONDANSETRON HCL 4 MG/2ML IJ SOLN
INTRAMUSCULAR | Status: DC | PRN
Start: 2021-04-23 — End: 2021-04-23
  Administered 2021-04-23: 4 mg via INTRAVENOUS

## 2021-04-23 MED ORDER — FENTANYL CITRATE (PF) 100 MCG/2ML IJ SOLN: 25.0000 ug | INTRAMUSCULAR | Status: DC | PRN

## 2021-04-23 MED ORDER — LACTATED RINGERS IV SOLN
INTRAVENOUS | Status: DC | PRN
Start: 2021-04-23 — End: 2021-04-23

## 2021-04-23 MED ORDER — FENTANYL CITRATE (PF) 100 MCG/2ML IJ SOLN
INTRAMUSCULAR | Status: DC | PRN
  Administered 2021-04-23: 50 ug via INTRAVENOUS

## 2021-04-23 MED ORDER — PROPOFOL 10 MG/ML IV BOLUS
INTRAVENOUS | Status: DC | PRN
  Administered 2021-04-23: 120 mg via INTRAVENOUS

## 2021-04-23 MED ORDER — SUCCINYLCHOLINE CHLORIDE 200 MG/10ML IV SOSY
PREFILLED_SYRINGE | INTRAVENOUS | Status: DC | PRN
  Administered 2021-04-23: 120 mg via INTRAVENOUS

## 2021-04-23 MED ORDER — MIDAZOLAM HCL 2 MG/2ML IJ SOLN
INTRAMUSCULAR | Status: AC
  Filled 2021-04-23: qty 2

## 2021-04-23 MED ORDER — FENTANYL CITRATE (PF) 100 MCG/2ML IJ SOLN
INTRAMUSCULAR | Status: AC
  Filled 2021-04-23: qty 2

## 2021-04-23 NOTE — Progress Notes (Signed)
[facility]  Date of Admission:  04/16/2021     Total days of antibiotics 5         ASSESSMENT:  [name] CT chest with new possible pneumonia and emphysema. Scheduled for bronchoscopy today with specimens to be obtained for cultures. Have requested anaerobic/aerobic, AFB, and PJP smear as able. Continues to require O2 supplementation via nasal cannula with no new increase in need. Will continue with current ART therapy and Bactrim for OI prophylaxis. Await culture results with additional antibiotics pending culture results as needed. Remaining medical and supportive care per primary team.  PLAN:  Continue Symtuza and Bactrim.  Bronchoscopy today with specimens for culture.  Remaining medical and supportive care per primary team.    Principal Problem:   Community acquired pneumonia Active Problems:   HIV (human immunodeficiency virus infection) ([name])   Chronic systolic CHF (congestive heart failure) ([name])   Acute respiratory failure with hypoxia ([name])   Type 2 diabetes mellitus ([name])   History of tuberculosis   History of Pneumocystis jirovecii pneumonia   Stage 3b chronic kidney disease (CKD) ([name]) - baseline SCr 1.39-1.72   History of pulmonary embolus (PE)   S/P colostomy ([name])   Pruritic dermatitis   Bronchiectasis, tuberculous, bacteriologic/histologic exam unknown    [MAR Hold] Darunavir-Cobicistat-Emtricitabine-Tenofovir Alafenamide  1 tablet Oral Q breakfast   [MAR Hold] dolutegravir  50 mg Oral Daily   [MAR Hold] guaiFENesin  5 mL Oral Q4H   [MAR Hold] heparin  5,000 Units Subcutaneous Q8H   [MAR Hold] ipratropium-albuterol  3 mL Nebulization BID   [MAR Hold] triamcinolone 0.1 % cream : eucerin   Topical TID    SUBJECTIVE:  Afebrile overnight with no acute events. Remains on 3.5 L of O2 via [place]. No new concerns/complaints. A medical interpreter was present via tablet to aid in communication.   Allergies  Allergen Reactions    Metformin And Related Swelling and Rash     Review of Systems: Review of Systems  Constitutional:  Negative for chills, fever and weight loss.  Respiratory:  Negative for cough, shortness of breath and wheezing.   Cardiovascular:  Negative for chest pain and leg swelling.  Gastrointestinal:  Negative for abdominal pain, constipation, diarrhea, nausea and vomiting.  Skin:  Negative for rash.     OBJECTIVE: Vitals:   04/23/21 0435 04/23/21 0802 04/23/21 0808 04/23/21 1025  BP: 122/76 119/78  118/83  Pulse: 82 86  85  Resp: 19 16  (!) 21  Temp:  98.1 F (36.7 C)  97.6 F (36.4 C)  TempSrc:  Oral  Temporal  SpO2: 94% 96% 97% 95%  Weight:       Body mass index is 22.98 kg/m.  Physical Exam Constitutional:      General: He is not in acute distress.    Appearance: He is well-developed. He is ill-appearing.     Interventions: Nasal cannula in place.  Cardiovascular:     Rate and Rhythm: Normal rate and regular rhythm.     Heart sounds: Normal heart sounds.  Pulmonary:     Effort: Pulmonary effort is normal.     Breath sounds: Normal breath sounds.  Skin:    General: Skin is warm and dry.  Neurological:     Mental Status: He is alert and oriented to person, place, and time.  Psychiatric:        Behavior: Behavior normal.        Thought Content: Thought content normal.  Judgment: Judgment normal.    Lab Results Lab Results  Component Value Date   WBC 7.2 04/23/2021   HGB 13.6 04/23/2021   HCT 42.0 04/23/2021   MCV 102.2 (H) 04/23/2021   PLT 488 (H) 04/23/2021    Lab Results  Component Value Date   CREATININE 1.30 (H) 04/23/2021   BUN 27 (H) 04/23/2021   NA 134 (L) 04/23/2021   K 4.5 04/23/2021   CL 91 (L) 04/23/2021   CO2 31 04/23/2021    Lab Results  Component Value Date   ALT 9 04/17/2021   AST 12 (L) 04/17/2021   ALKPHOS 50 04/17/2021   BILITOT 0.6 04/17/2021     Microbiology: Recent Results (from the past 240 hour(s))  Resp Panel by  RT-PCR (Flu A&B, Covid) Nasopharyngeal Swab     Status: None   Collection Time: 04/16/21  8:19 PM   Specimen: Nasopharyngeal Swab; Nasopharyngeal(NP) swabs in vial transport medium  Result Value Ref Range Status   SARS Coronavirus 2 by RT PCR NEGATIVE NEGATIVE Final    Comment: (NOTE) SARS-CoV-2 target nucleic acids are NOT DETECTED.  The SARS-CoV-2 RNA is generally detectable in upper respiratory specimens during the acute phase of infection. The lowest concentration of SARS-CoV-2 viral copies this assay can detect is 138 copies/mL. A negative result does not preclude SARS-Cov-2 infection and should not be used as the sole basis for treatment or other patient management decisions. A negative result may occur with  improper specimen collection/handling, submission of specimen other than nasopharyngeal swab, presence of viral mutation(s) within the areas targeted by this assay, and inadequate number of viral copies(<138 copies/mL). A negative result must be combined with clinical observations, patient history, and epidemiological information. The expected result is Negative.  Fact Sheet for Patients:  EntrepreneurPulse.com.au  Fact Sheet for Healthcare Providers:  IncredibleEmployment.be  This test is no t yet approved or cleared by the [place] FDA and  has been authorized for detection and/or diagnosis of SARS-CoV-2 by FDA under an Emergency Use Authorization (EUA). This EUA will remain  in effect (meaning this test can be used) for the duration of the COVID-19 declaration under Section 564(b)(1) of the Act, 21 U.S.C.section 360bbb-3(b)(1), unless the authorization is terminated  or revoked sooner.       Influenza A by PCR NEGATIVE NEGATIVE Final   Influenza B by PCR NEGATIVE NEGATIVE Final    Comment: (NOTE) The Xpert Xpress SARS-CoV-2/FLU/RSV plus assay is intended as an aid in the diagnosis of influenza from Nasopharyngeal swab  specimens and should not be used as a sole basis for treatment. Nasal washings and aspirates are unacceptable for Xpert Xpress SARS-CoV-2/FLU/RSV testing.  Fact Sheet for Patients: EntrepreneurPulse.com.au  Fact Sheet for Healthcare Providers: IncredibleEmployment.be  This test is not yet approved or cleared by the [place] FDA and has been authorized for detection and/or diagnosis of SARS-CoV-2 by FDA under an Emergency Use Authorization (EUA). This EUA will remain in effect (meaning this test can be used) for the duration of the COVID-19 declaration under Section 564(b)(1) of the Act, 21 U.S.C. section 360bbb-3(b)(1), unless the authorization is terminated or revoked.  Performed at [facility], [place] [address], [place], [place] [address]   Blood culture (routine x 2)     Status: None   Collection Time: 04/16/21  8:55 PM   Specimen: BLOOD  Result Value Ref Range Status   Specimen Description BLOOD BLOOD LEFT HAND  Final   Special Requests   Final  BOTTLES DRAWN AEROBIC AND ANAEROBIC Blood Culture adequate volume   Culture   Final    NO GROWTH 5 DAYS Performed at [facility], [place] [address], [place],  [address]    Report Status 04/21/2021 FINAL  Final  Blood culture (routine x 2)     Status: None   Collection Time: 04/16/21  8:59 PM   Specimen: BLOOD  Result Value Ref Range Status   Specimen Description BLOOD RIGHT ANTECUBITAL  Final   Special Requests   Final    BOTTLES DRAWN AEROBIC AND ANAEROBIC Blood Culture adequate volume   Culture   Final    NO GROWTH 5 DAYS Performed at [facility], [place] [address], [place],  [address]    Report Status 04/21/2021 FINAL  Final     Terri Piedra, NP [facility]  04/23/2021  10:39 AM

## 2021-04-23 NOTE — Anesthesia Preprocedure Evaluation (Addendum)
Anesthesia Evaluation  Patient identified by MRN, date of birth, ID band Patient awake    Reviewed: Allergy & Precautions, NPO status , Patient's Chart, lab work & pertinent test results  Airway Mallampati: II  TM Distance: >3 FB Neck ROM: Full    Dental  (+) Teeth Intact, Dental Advisory Given, Poor Dentition   Pulmonary shortness of breath and with exertion, COPD (on [place] 3LPM in hospital),  COPD inhaler and oxygen dependent, former smoker, PE (2017) TB previously treated PJP pna 2018  Quit smoking 2003  presented with fever, dry cough for 1 week.  Found to be hypoxemic with new RLL infiltrate with background of chronic LLL bronchiectasis.   Pulmonary exam normal breath sounds clear to auscultation       Cardiovascular pulmonary hypertension (moerately reduced RV function, mildly elevated PASP)+CHF (LVEF 45%)  Normal cardiovascular exam Rhythm:Regular Rate:Normal  Echo 10/2020: 1. Left ventricular ejection fraction, by estimation, is 45 to 50%. The  left ventricle has mildly decreased function. The left ventricle  demonstrates global hypokinesis. Left ventricular diastolic parameters are  indeterminate.  2. Right ventricular systolic function is moderately reduced. RV free  wall hypokinesis. The right ventricular size is normal. There is mildly  elevated pulmonary artery systolic pressure. The estimated right  ventricular systolic pressure is [id number] mmHg.  3. The mitral valve is normal in structure. Trivial mitral valve  regurgitation.  4. The aortic valve was not well visualized. Aortic valve regurgitation  is not visualized. No aortic stenosis is present.  5. The inferior vena cava is normal in size with greater than 50%  respiratory variability, suggesting right atrial pressure of 3 mmHg.    Neuro/Psych PSYCHIATRIC DISORDERS Depression negative neurological ROS     GI/Hepatic GERD  Medicated and Controlled,(+)  Hepatitis -  Endo/Other  negative endocrine ROSdiabetes  Renal/GU Renal InsufficiencyRenal diseaseCr 1.3  negative genitourinary   Musculoskeletal negative musculoskeletal ROS (+)   Abdominal   Peds  Hematology  (+) HIV, hct 42   Anesthesia Other Findings   Reproductive/Obstetrics negative OB ROS                            Anesthesia Physical Anesthesia Plan  ASA: 4  Anesthesia Plan: General   Post-op Pain Management:    Induction: Intravenous  PONV Risk Score and Plan: 2 and Treatment may vary due to age or medical condition and Ondansetron  Airway Management Planned: Oral ETT  Additional Equipment: None  Intra-op Plan:   Post-operative Plan: Extubation in OR  Informed Consent: I have reviewed the patients History and Physical, chart, labs and discussed the procedure including the risks, benefits and alternatives for the proposed anesthesia with the patient or authorized representative who has indicated his/her understanding and acceptance.     Dental advisory given and Interpreter used for interveiw  Plan Discussed with: CRNA  Anesthesia Plan Comments:        Anesthesia Quick Evaluation

## 2021-04-23 NOTE — Progress Notes (Signed)
Uneventful bronch. Continue to push O2 wean. If nearing DC, please reach out and will arrange OP f/u in clinic to review bronch results as some of the cultures take a couple weeks.  Erskine Emery MD PCCM

## 2021-04-23 NOTE — Transfer of Care (Signed)
Immediate Anesthesia Transfer of Care Note  Patient: [name]  Procedure(s) Performed: VIDEO BRONCHOSCOPY WITHOUT FLUORO (Left) BRONCHIAL WASHINGS (Bilateral)  Patient Location: PACU  Anesthesia Type:General  Level of Consciousness: awake and alert   Airway & Oxygen Therapy: Patient Spontanous Breathing and Patient connected to face mask oxygen  Post-op Assessment: Report given to RN and Post -op Vital signs reviewed and stable  Post vital signs: Reviewed and stable  Last Vitals:  Vitals Value Taken Time  BP 127/84 04/23/21 1146  Temp    Pulse 47 04/23/21 1147  Resp 24 04/23/21 1147  SpO2 93 % 04/23/21 1147  Vitals shown include unvalidated device data.  Last Pain:  Vitals:   04/23/21 1025  TempSrc: Temporal  PainSc: 0-No pain         Complications: No notable events documented.

## 2021-04-23 NOTE — Progress Notes (Signed)
04/23/2021   I have seen and evaluated the patient for immunocompromised pneumonia.  S:  No events, seen in endo.  O: Blood pressure 118/83, pulse 85, temperature 97.6 F (36.4 C), temperature source Temporal, resp. rate (!) 21, weight 79 kg, SpO2 95 %.  No distress Diminished breath sounds L base, R occasional rhonci 97% on 3LPM Ext with muscle wasting  A:  Immunocompromised pneumonia LLL bronchiectasis question in flare Prior TB, prior PJP  P:  Bronch/BAL today with BAL LLL and RLL send for fungal/bacterial/AFB cultures as well as PJP DFA.  Consent signed in chart  Wean O2 for sats > 90%  Will check on tomorrow and establish OP plan  Erskine Emery MD [facility] Prefer epic messenger for cross cover needs If after hours, please call E-link

## 2021-04-23 NOTE — Op Note (Signed)
Bronchoscopy Procedure Note  [name]  [id number]  10-15-1970  Date:04/23/21  Time:12:37 PM   Provider Performing:Anaika Santillano C Tamala Julian   Procedure(s):  Flexible bronchoscopy with bronchial alveolar lavage (02111)  Indication(s) Immunocompromised Pneumonia  Consent Risks of the procedure as well as the alternatives and risks of each were explained to the patient and/or caregiver.  Consent for the procedure was obtained and is signed in the bedside chart  Anesthesia General   Time Out Verified patient identification, verified procedure, site/side was marked, verified correct patient position, special equipment/implants available, medications/allergies/relevant history reviewed, required imaging and test results available.   Sterile Technique Usual hand hygiene, masks, gowns, and gloves were used   Procedure Description Bronchoscope advanced through endotracheal tube and into airway.  Airways were examined down to subsegmental level with findings noted below.   Following diagnostic evaluation, BAL(s) performed in RLL and LLL with normal saline and return of cloudy fluid  Findings:  - Mild bronchial pitting - Marked LLL bronchiectasis with purulent secretions - Mild purulent secretions RLL  Complications/Tolerance None; patient tolerated the procedure well. Chest X-ray is not needed post procedure.   EBL Minimal   Specimen(s) - LLL BAL - RLL BAL

## 2021-04-23 NOTE — Progress Notes (Signed)
PROGRESS NOTE  [name]  DOB: [birth year]  PCP: Kathyrn Lass ZOX:[id number]  DOA: 04/16/2021  LOS: 5 days  Hospital Day: 8  Chief Complaint  Patient presents with   Cough   Headache   Shortness of Breath   Brief narrative: [name] is a 51 y.o. male who only speaks [name] with PMH significant for HIV, poor compliance to meds, underlying bronchiectasis with history of tuberculosis, history of pneumocystis pneumonia, reported history of chronic systolic heart failure with last echo in August 2022 with an EF of 45%, RV dysfunction with reduced EF, type 2 diabetes, DVT/PE (2017), history of colostomy. Patient presented to the ED on 04/16/2021 with complaint of 1 week history of shortness of breath, productive cough and generalized not feeling well for several days.  Patient states he ran out of Bactrim 3 days prior to presentation.  Chest x-ray on admission showed right lower lobe opacity Admitted to hospital service. ID was consulted.  Subjective: Patient was seen and examined this morning.  Middle-aged [place] American male. Lying on bed.  Remains on 2 L oxygen by nasal cannula.  No family at bedside.  Assessment/Plan: Community acquired pneumonia -Chest x-ray on admission with right lower lobe obesity -Completed 5-day course of IV Rocephin but continued to remain hypoxic. -Pulmonary consultation obtained.  2/7, CT chest showed new pulmonary consultation in posterior right lower lobe and mild patchy airspace disease in the posterior right middle lobe consistent with pneumonia. -Noted to plan to do bronchoscopy and lavage today. -No fever, WBC count normal  Acute hypoxic respiratory failure -Patient continues to require 3 L oxygen by nasal cannula at rest. -He has acute pneumonia.  Also has chronic pulm issues that include bronchiectasis due to history of tuberculosis, pneumocystis pneumonia and mildly elevated pulmonary artery pressure. -Continue to wean down as  tolerated. -Continue inhalers, Mucinex.   HIV (human immunodeficiency virus infection) ([place])- (present on admission) -Patient remains on his salvage regimen with Symtuza and Tivicay.   -Per ID, most recent viral load in November 2022 was pretty well suppressed at just 50 copies. -Was on Bactrim for PCP prophylaxis.  Held on 2/7 because of hyperkalemia.  Pruritus -Itching better with topical therapy. Also on Benadryl as needed  Chronic systolic CHF -Last echo from August 2022 with EF 45 to 50%, LV global hypokinesis, mildly elevated pulmonary artery pressure to 35. -Heart rate and blood pressure stable.  I do not see any beta-blocker ACE/ARB or diuretics.  Type 2 diabetes mellitus -Seems diet controlled at this time.  CKD 3B -Creatinine stable. Recent Labs    02/08/21 0127 02/09/21 0054 03/27/21 1202 04/16/21 1954 04/16/21 2108 04/17/21 0416 04/20/21 0945 04/21/21 0245 04/22/21 0348 04/23/21 0102  BUN 19 35* _0 21* 27*  CREATININE 1.36* 1.36* 1.72* 1.46* 1.40* 1.31* 1.12 1.36* 1.27* 1.30*    Hyperkalemia -Potassium level was elevated to 5.4 on 2/7.  Improved with 1 dose of Lokelma. -Continue to monitor. Recent Labs  Lab 04/17/21 0416 04/20/21 0945 04/21/21 0245 04/22/21 0348 04/23/21 0102  K 3.5 3.7 4.3 5.4* 4.5  MG 1.7  --   --   --   --     History of DVT PE in 2017 -Seem to have completed a course of Eliquis.  S/P colostomy ([place]) Chronic    Mobility: Encourage ambulation Goals of care   Code Status: Full Code    Nutritional status:  Body mass index is 22.98 kg/m.  Diet:  Diet Order             Diet NPO time specified  Diet effective midnight                   Wounds:  -  DVT prophylaxis:  heparin injection 5,000 Units Start: 04/17/21 0000 SCDs Start: 04/16/21 2346   Antimicrobials: HIV meds Fluid: None Consultants: ID, pulm called Family Communication: None at bedside  Status is: Inpatient  Continue  in-hospital care because: Continues to remain hypoxic.  Pending BAL today Level of care: Med-Surg   Dispo: The patient is from: Home              Anticipated d/c is to: Hopefully home in next 2 to 3 days              Patient currently is not medically stable to d/c.   Difficult to place patient No     Infusions:    Scheduled Meds:  [MAR Hold] Darunavir-Cobicistat-Emtricitabine-Tenofovir Alafenamide  1 tablet Oral Q breakfast   [MAR Hold] dolutegravir  50 mg Oral Daily   [MAR Hold] guaiFENesin  5 mL Oral Q4H   [MAR Hold] heparin  5,000 Units Subcutaneous Q8H   [MAR Hold] ipratropium-albuterol  3 mL Nebulization BID   [MAR Hold] triamcinolone 0.1 % cream : eucerin   Topical TID    PRN meds: [MAR Hold] acetaminophen **OR** [MAR Hold] acetaminophen, [MAR Hold] albuterol, [MAR Hold] diphenhydrAMINE, [MAR Hold] guaiFENesin-dextromethorphan, [MAR Hold] hydrOXYzine, [MAR Hold] ondansetron **OR** [MAR Hold] ondansetron (ZOFRAN) IV   Antimicrobials: Anti-infectives (From admission, onward)    Start     Dose/Rate Route Frequency Ordered Stop   04/17/21 1000  [MAR Hold]  dolutegravir (TIVICAY) tablet 50 mg        (MAR Hold since Wed 04/23/2021 at 1027.Hold Reason: Transfer to a Procedural area)   50 mg Oral Daily 04/16/21 2345     04/17/21 1000  sulfamethoxazole-trimethoprim (BACTRIM DS) 800-160 MG per tablet 1 tablet  Status:  Discontinued        1 tablet Oral Daily 04/16/21 2345 04/16/21 2349   04/17/21 1000  sulfamethoxazole-trimethoprim (BACTRIM DS) 800-160 MG per tablet 1 tablet  Status:  Discontinued        1 tablet Oral Daily 04/17/21 0816 04/22/21 1010   04/17/21 0800  [MAR Hold]  Darunavir-Cobicistat-Emtricitabine-Tenofovir Alafenamide (SYMTUZA) 800-150-200-10 MG TABS 1 tablet        (MAR Hold since Wed 04/23/2021 at 1027.Hold Reason: Transfer to a Procedural area)   1 tablet Oral Daily with breakfast 04/16/21 2345     04/16/21 2030  cefTRIAXone (ROCEPHIN) 2 g in sodium chloride 0.9 %  100 mL IVPB        2 g 200 mL/hr over 30 Minutes Intravenous Every 24 hours 04/16/21 2019 04/21/21 0031   04/16/21 2030  azithromycin (ZITHROMAX) 500 mg in sodium chloride 0.9 % 250 mL IVPB  Status:  Discontinued        500 mg 250 mL/hr over 60 Minutes Intravenous Every 24 hours 04/16/21 2019 04/17/21 1627       Objective: Vitals:   04/23/21 0808 04/23/21 1025  BP:  118/83  Pulse:  85  Resp:  (!) 21  Temp:  97.6 F (36.4 C)  SpO2: 97% 95%    Intake/Output Summary (Last 24 hours) at 04/23/2021 1043 Last data filed at 04/23/2021 0436 Gross per 24 hour  Intake 360 ml  Output 1600 ml  Net -1240 ml  Filed Weights   04/18/21 0500 04/19/21 0502  Weight: 76.7 kg 79 kg   Weight change:  Body mass index is 22.98 kg/m.   Physical Exam: General exam: Thin built young African-American male.  Not in physical distress Skin: No rashes, lesions or ulcers. HEENT: Atraumatic, normocephalic, no obvious bleeding Lungs: Clear to auscultation bilaterally except for mild wheezing CVS: Regular rate and rhythm, no murmur GI/Abd soft, nontender, nondistended, bowel sound present CNS: Alert, awake, oriented x3 Psychiatry: Mood appropriate Extremities: No pedal edema, no calf tenderness  Data Review: I have personally reviewed the laboratory data and studies available.  F/u labs  Unresulted Labs (From admission, onward)     Start     Ordered   04/23/21 0500  Glucose 6 phosphate dehydrogenase  Tomorrow morning,   R       Question:  Specimen collection method  Answer:  Lab=Lab collect   04/22/21 1242   04/22/21 1049  Pneumocystis smear by DFA  Once,   R        04/22/21 1048   04/20/21 0803  CBC with Differential/Platelet  Daily,   R     Question:  Specimen collection method  Answer:  Lab=Lab collect   04/20/21 0802   04/20/21 0981  Basic metabolic panel  Daily,   R     Question:  Specimen collection method  Answer:  Lab=Lab collect   04/20/21 0802            Signed, Terrilee Croak, MD [facility] 04/23/2021

## 2021-04-23 NOTE — Anesthesia Procedure Notes (Signed)
Procedure Name: Intubation Date/Time: 04/23/2021 11:21 AM Performed by: Janene Harvey, CRNA Pre-anesthesia Checklist: Patient identified, Emergency Drugs available, Suction available and Patient being monitored Patient Re-evaluated:Patient Re-evaluated prior to induction Oxygen Delivery Method: Circle system utilized Preoxygenation: Pre-oxygenation with 100% oxygen Induction Type: IV induction and Rapid sequence Laryngoscope Size: Mac and 4 Grade View: Grade I Tube type: Oral Tube size: 8.5 mm Number of attempts: 1 Airway Equipment and Method: Stylet and Oral airway Placement Confirmation: ETT inserted through vocal cords under direct vision, positive ETCO2 and breath sounds checked- equal and bilateral Secured at: 23 cm Tube secured with: Tape Dental Injury: Teeth and Oropharynx as per pre-operative assessment

## 2021-04-23 NOTE — Anesthesia Postprocedure Evaluation (Signed)
Anesthesia Post Note  Patient: [name]  Procedure(s) Performed: VIDEO BRONCHOSCOPY WITHOUT FLUORO (Left) BRONCHIAL WASHINGS (Bilateral)     Patient location during evaluation: PACU Anesthesia Type: General Level of consciousness: awake and alert, oriented and patient cooperative Pain management: pain level controlled Vital Signs Assessment: post-procedure vital signs reviewed and stable Respiratory status: spontaneous breathing, nonlabored ventilation and respiratory function stable Cardiovascular status: blood pressure returned to baseline and stable Postop Assessment: no apparent nausea or vomiting Anesthetic complications: no   No notable events documented.  Last Vitals:  Vitals:   04/23/21 1146 04/23/21 1203  BP: 127/84 (!) 132/99  Pulse: (!) 110 (!) 124  Resp: (!) 24 20  Temp: 36.6 C   SpO2: 91% 96%    Last Pain:  Vitals:   04/23/21 1146  TempSrc:   PainSc: 0-No pain                 Pervis Hocking

## 2021-04-24 ENCOUNTER — Other Ambulatory Visit (HOSPITAL_COMMUNITY): Payer: Self-pay

## 2021-04-24 ENCOUNTER — Encounter (HOSPITAL_COMMUNITY): Payer: Self-pay | Admitting: Internal Medicine

## 2021-04-24 DIAGNOSIS — J189 Pneumonia, unspecified organism: Secondary | ICD-10-CM | POA: Diagnosis not present

## 2021-04-24 LAB — CBC WITH DIFFERENTIAL/PLATELET
Abs Immature Granulocytes: 0.1 10*3/uL — ABNORMAL HIGH (ref 0.00–0.07)
Basophils Absolute: 0 10*3/uL (ref 0.0–0.1)
Basophils Relative: 0 %
Eosinophils Absolute: 0 10*3/uL (ref 0.0–0.5)
Eosinophils Relative: 0 %
HCT: 42.9 % (ref 39.0–52.0)
Hemoglobin: 13.8 g/dL (ref 13.0–17.0)
Immature Granulocytes: 2 %
Lymphocytes Relative: 15 %
Lymphs Abs: 1 10*3/uL (ref 0.7–4.0)
MCH: 32.1 pg (ref 26.0–34.0)
MCHC: 32.2 g/dL (ref 30.0–36.0)
MCV: 99.8 fL (ref 80.0–100.0)
Monocytes Absolute: 0.4 10*3/uL (ref 0.1–1.0)
Monocytes Relative: 6 %
Neutro Abs: 4.9 10*3/uL (ref 1.7–7.7)
Neutrophils Relative %: 77 %
Platelets: 517 10*3/uL — ABNORMAL HIGH (ref 150–400)
RBC: 4.3 MIL/uL (ref 4.22–5.81)
RDW: 11.9 % (ref 11.5–15.5)
WBC: 6.4 10*3/uL (ref 4.0–10.5)
nRBC: 0 % (ref 0.0–0.2)

## 2021-04-24 LAB — PNEUMOCYSTIS JIROVECI SMEAR BY DFA

## 2021-04-24 LAB — BASIC METABOLIC PANEL
Anion gap: 12 (ref 5–15)
BUN: 33 mg/dL — ABNORMAL HIGH (ref 6–20)
CO2: 28 mmol/L (ref 22–32)
Calcium: 10.1 mg/dL (ref 8.9–10.3)
Chloride: 93 mmol/L — ABNORMAL LOW (ref 98–111)
Creatinine, Ser: 1.25 mg/dL — ABNORMAL HIGH (ref 0.61–1.24)
GFR, Estimated: >60 mL/min (ref >60)
Glucose, Bld: 171 mg/dL — ABNORMAL HIGH (ref 70–99)
Potassium: 4.4 mmol/L (ref 3.5–5.1)
Sodium: 133 mmol/L — ABNORMAL LOW (ref 135–145)

## 2021-04-24 LAB — ACID FAST SMEAR (AFB, MYCOBACTERIA)
Acid Fast Smear: NEGATIVE
Acid Fast Smear: NEGATIVE

## 2021-04-24 LAB — GLUCOSE 6 PHOSPHATE DEHYDROGENASE
G6PDH: 10.6 U/g{Hb} (ref 3.8–14.2)
Hemoglobin: 13.6 g/dL (ref 13.0–17.7)

## 2021-04-24 MED ORDER — SULFAMETHOXAZOLE-TRIMETHOPRIM 800-160 MG PO TABS
1.0000 | ORAL_TABLET | Freq: Every day | ORAL | Status: DC
  Administered 2021-04-24: 1 via ORAL
  Filled 2021-04-24: qty 1

## 2021-04-24 MED ORDER — SULFAMETHOXAZOLE-TRIMETHOPRIM 800-160 MG PO TABS
1.0000 | ORAL_TABLET | Freq: Every day | ORAL | 2 refills | Status: DC
  Filled 2021-04-24: qty 30, 30d supply, fill #0

## 2021-04-24 MED ORDER — TIVICAY 50 MG PO TABS
50.0000 mg | ORAL_TABLET | Freq: Every day | ORAL | 2 refills | Status: AC
  Filled 2021-04-24: qty 30, 30d supply, fill #0

## 2021-04-24 MED ORDER — GUAIFENESIN 100 MG/5ML PO LIQD
5.0000 mL | ORAL | 0 refills | Status: DC
  Filled 2021-04-24: qty 120, 4d supply, fill #0

## 2021-04-24 MED ORDER — SYMTUZA 800-150-200-10 MG PO TABS
1.0000 | ORAL_TABLET | Freq: Every day | ORAL | 2 refills | Status: AC
  Filled 2021-04-24: qty 30, 30d supply, fill #0

## 2021-04-24 NOTE — Progress Notes (Signed)
Note sent to [facility] office staff for appt in 2-3 weeks to review bronch results and establish care for focal bronchiectasis.  PCCM available PRN.

## 2021-04-24 NOTE — Progress Notes (Signed)
Mobility Specialist Progress Note    04/24/21 1209  Mobility  Activity Ambulated independently in hallway  Level of Assistance Standby assist, set-up cues, supervision of patient - no hands on  Assistive Device None  Distance Ambulated (ft) 500 ft  Activity Response Tolerated fair  $Mobility charge 1 Mobility   Pt received in bed and agreeable. Ambulated on RA. No complaints. Returned to sitting EOB with call bell in reach.   [facility] Mobility Specialist  M.S. 5N: [phone]

## 2021-04-24 NOTE — Progress Notes (Signed)
SATURATION QUALIFICATIONS: (This note is used to comply with regulatory documentation for home oxygen)  Patient Saturations on Room Air at Rest = 97%  Patient Saturations on Room Air while Ambulating = 94%  Patient Saturations on 0 Liters of oxygen while Ambulating = 94%  Please briefly explain why patient needs home oxygen:  Patient does not need oxygen. Ambulated well in the halls.walking with no DME. Denies SOB and chest pain. Pulse ox remained abover 94% while ambulating. Once sitting back down oxygen stat increased to 98%.

## 2021-04-24 NOTE — Discharge Summary (Signed)
Physician Discharge Summary  [name]:[id number] DOB: 1970-06-07 DOA: 04/16/2021  PCP: Pcp, No  Admit date: 04/16/2021 Discharge date: 04/24/2021  Admitted From: Home Discharge disposition: Home  Recommendations at discharge:  Continue with recommended antibiotics and HIV medicines. Follow-up with infectious disease and pulmonology as recommended.  Discharge Diagnosis:   Principal Problem:   Community acquired pneumonia Active Problems:   HIV (human immunodeficiency virus infection) ([place])   Chronic systolic CHF (congestive heart failure) ([name])   Acute respiratory failure with hypoxia ([name])   Type 2 diabetes mellitus ([name])   History of tuberculosis   History of Pneumocystis jirovecii pneumonia   Stage 3b chronic kidney disease (CKD) ([name]) - baseline SCr 1.39-1.72   History of pulmonary embolus (PE)   S/P colostomy ([name])   Pruritic dermatitis   Bronchiectasis, tuberculous, bacteriologic/histologic exam unknown    Chief Complaint  Patient presents with   Cough   Headache   Shortness of Breath   Brief narrative: [name] is a 51 y.o. male who only speaks [name] with PMH significant for HIV, poor compliance to meds, underlying bronchiectasis with history of tuberculosis, history of pneumocystis pneumonia, reported history of chronic systolic heart failure with last echo in August 2022 with an EF of 45%, RV dysfunction with reduced EF, type 2 diabetes, DVT/PE (2017), history of colostomy. Patient presented to the ED on 04/16/2021 with complaint of 1 week history of shortness of breath, productive cough and generalized not feeling well for several days.  Patient states he ran out of Bactrim 3 days prior to presentation.  Chest x-ray on admission showed right lower lobe opacity Admitted to hospital service. ID was consulted.  Subjective: Patient was seen and examined this morning.  Middle-aged [place] American male. Lying on bed.  Remains on 2 L oxygen by  nasal cannula.  No family at bedside.  Hospital course: Community acquired pneumonia -Chest x-ray on admission with right lower lobe obesity -Completed 5-day course of IV Rocephin but continued to remain hypoxic. -Pulmonary consultation obtained.  2/7, CT chest showed new pulmonary consultation in posterior right lower lobe and mild patchy airspace disease in the posterior right middle lobe consistent with pneumonia. -On 2/8, patient underwent bronchoscopy and lavage by pulmonology. -No fever, WBC count normal. -He was able to ambulate in the hallway without oxygen today. -Okay to discharge to home.  Per ID, patient does not need any additional antibiotics other than HIV meds and PCP prophylaxis.  Acute hypoxic respiratory failure -In the setting of chronic pulm issues that include bronchiectasis due to history of tuberculosis, pneumocystis pneumonia and mildly elevated pulmonary artery pressure. -Patient continued to require 3 L oxygen nasal cannula at rest till yesterday.  Improved after bronchoscopy and BAL.  Able to ambulate in the hallway today without supplemental oxygen.  -Continue inhalers, Mucinex.   HIV (human immunodeficiency virus infection) ([place]) -Patient remains on his salvage regimen with Symtuza and Tivicay.   -Per ID, most recent viral load in November 2022 was pretty well suppressed at just 50 copies. -Continue Bactrim along with HIV meds. -Follow-up with ID as an outpatient  Pruritus -Itching better with topical therapy.   Chronic systolic CHF -Last echo from August 2022 with EF 45 to 50%, LV global hypokinesis, mildly elevated pulmonary artery pressure to 35. -Heart rate and blood pressure stable.  He is not on any beta-blocker, ACE/ARB or diuretics.  Type 2 diabetes mellitus -Seems diet controlled at this time.  CKD 3B -Creatinine stable. Recent Labs  02/09/21 0054 03/27/21 1202 04/16/21 1954 04/16/21 2108 04/17/21 0416 04/20/21 0945 04/21/21 0245  04/22/21 0348 04/23/21 0102 04/24/21 0317  BUN 35* _0 21* 27* 33*  CREATININE 1.36* 1.72* 1.46* 1.40* 1.31* 1.12 1.36* 1.27* 1.30* 1.25*   Hyperkalemia -Potassium level was elevated to 5.4 on 2/7.  Improved with 1 dose of Lokelma. Recent Labs  Lab 04/20/21 0945 04/21/21 0245 04/22/21 0348 04/23/21 0102 04/24/21 0317  K 3.7 4.3 5.4* 4.5 4.4   History of DVT PE in 2017 -Seem to have completed a course of Eliquis.  S/P colostomy ([place]) Chronic    Mobility: Encourage ambulation Goals of care   Code Status: Full Code    Nutritional status:  Body mass index is 22.98 kg/m.        Discharge Exam:   Vitals:   04/23/21 1552 04/23/21 2009 04/24/21 0729 04/24/21 0900  BP: 102/72 105/70 111/88   Pulse: 78 80 79   Resp: _1 Temp: 97.7 F (36.5 C) 97.6 F (36.4 C) 97.7 F (36.5 C)   TempSrc: Oral Oral Oral   SpO2: 98% 94% 95% 100%  Weight:        Body mass index is 22.98 kg/m.   General exam: Thin built young African-American male.  Not in physical distress Skin: No rashes, lesions or ulcers. HEENT: Atraumatic, normocephalic, no obvious bleeding Lungs: Clear to auscultation bilaterally except for mild wheezing CVS: Regular rate and rhythm, no murmur GI/Abd soft, nontender, nondistended, bowel sound present CNS: Alert, awake, oriented x3 Psychiatry: Mood appropriate Extremities: No pedal edema, no calf tenderness  Follow ups:    Follow-up Information     [place]. Schedule an appointment as soon as possible for a visit in 1 week(s).   Why: office will call and arrange hospital followup appointment Contact information: [address] [place] [address] [phone]        Candee Furbish, MD Follow up.   Specialty: Pulmonary Disease Why: We will call you with appointment date and time to review bronchoscopy results Contact information: [place]  [phone]         Thayer Headings, MD Follow up.   Specialty: Infectious Diseases Contact information: [address] [place] [address] [phone]                 Discharge Instructions:   Discharge Instructions     Call MD for:  difficulty breathing, headache or visual disturbances   Complete by: As directed    Call MD for:  extreme fatigue   Complete by: As directed    Call MD for:  hives   Complete by: As directed    Call MD for:  persistant dizziness or light-headedness   Complete by: As directed    Call MD for:  persistant nausea and vomiting   Complete by: As directed    Call MD for:  severe uncontrolled pain   Complete by: As directed    Call MD for:  temperature >100.4   Complete by: As directed    Diet general   Complete by: As directed    Discharge instructions   Complete by: As directed    1. Continue with recommended antibiotics and HIV medicines. 2. Follow-up with infectious disease and pulmonology as recommended.  General discharge instructions: Follow with Primary MD Pcp, No in 7 days  Please request your PCP  to go over your hospital tests, procedures, radiology results at the follow up. Please get your medicines reviewed and adjusted.  Your PCP may decide to repeat certain labs or tests as needed. Do not drive, operate heavy machinery, perform activities at heights, swimming or participation in water activities or provide baby sitting services if your were admitted for syncope or siezures until you have seen by Primary MD or a Neurologist and advised to do so again. [place] Controlled Substance Reporting System database was reviewed. Do not drive, operate heavy machinery, perform activities at heights, swim, participate in water activities or provide baby-sitting services while on medications for pain, sleep and mood until your outpatient physician has reevaluated you and advised to do so again.  You are strongly  recommended to comply with the dose, frequency and duration of prescribed medications. Activity: As tolerated with Full fall precautions use walker/cane & assistance as needed Avoid using any recreational substances like cigarette, tobacco, alcohol, or non-prescribed drug. If you experience worsening of your admission symptoms, develop shortness of breath, life threatening emergency, suicidal or homicidal thoughts you must seek medical attention immediately by calling 911 or calling your MD immediately  if symptoms less severe. You must read complete instructions/literature along with all the possible adverse reactions/side effects for all the medicines you take and that have been prescribed to you. Take any new medicine only after you have completely understood and accepted all the possible adverse reactions/side effects.  Wear Seat belts while driving. You were cared for by a hospitalist during your hospital stay. If you have any questions about your discharge medications or the care you received while you were in the hospital after you are discharged, you can call the unit and ask to speak with the hospitalist or the covering physician. Once you are discharged, your primary care physician will handle any further medical issues. Please note that NO REFILLS for any discharge medications will be authorized once you are discharged, as it is imperative that you return to your primary care physician (or establish a relationship with a primary care physician if you do not have one).   Increase activity slowly   Complete by: As directed        Discharge Medications:   Allergies as of 04/24/2021       Reactions   Metformin And Related Swelling, Rash        Medication List     STOP taking these medications    azithromycin 600 MG tablet Commonly known as: ZITHROMAX   benzonatate 200 MG capsule Commonly known as: TESSALON   Clobetasol 17 Propionate 0.5 % Powd   enoxaparin 120 MG/0.8ML  injection Commonly known as: LOVENOX   famotidine 40 MG tablet Commonly known as: Pepcid   fluconazole 150 MG tablet Commonly known as: Diflucan   hydrOXYzine 25 MG tablet Commonly known as: ATARAX   predniSONE 50 MG tablet Commonly known as: DELTASONE   SM Mucus Relief 600 MG 12 hr tablet Generic drug: guaiFENesin Replaced by: guaiFENesin 100 MG/5ML liquid       TAKE these medications    albuterol 108 (90 Base) MCG/ACT inhaler Commonly known as: VENTOLIN HFA Inhale 2 puffs into the lungs every 6 (six) hours as needed for wheezing or shortness of breath.   guaiFENesin 100 MG/5ML liquid Commonly known as: ROBITUSSIN Take 5 mLs by mouth every 4 (four) hours. Replaces: SM Mucus Relief 600 MG 12 hr tablet   sulfamethoxazole-trimethoprim 800-160 MG tablet Commonly known as: BACTRIM  DS Take 1 tablet by mouth daily.   Symtuza 800-150-200-10 MG Tabs Generic drug: Darunavir-Cobicistat-Emtricitabine-Tenofovir Alafenamide Take 1 tablet by mouth daily with breakfast.   Tivicay 50 MG tablet Generic drug: dolutegravir Take 1 tablet (50 mg total) by mouth daily.   triamcinolone 0.1 % cream : eucerin Crea Apply 1 application topically 3 (three) times daily as needed.         The results of significant diagnostics from this hospitalization (including imaging, microbiology, ancillary and laboratory) are listed below for reference.    Procedures and Diagnostic Studies:   DG Chest Portable 1 View  Result Date: 04/16/2021 CLINICAL DATA:  Cough, shortness of breath EXAM: PORTABLE CHEST 1 VIEW COMPARISON:  Chest x-ray 03/27/2021 FINDINGS: Cardiomediastinal silhouette is grossly unchanged. Heart size within normal limits. Extensive irregular chronic interstitial opacities visualized throughout the left lung and bilateral upper lungs near the apices. Interval development of ground-glass consolidative opacity in the right lower lung zone. No pleural effusion or pneumothorax  visualized. IMPRESSION: 1. New consolidation in the right lower lung zone, consistent with pneumonia. 2. Extensive chronic changes in the upper lungs and left lung. Electronically Signed   By: Ofilia Neas M.D.   On: 04/16/2021 20:05     Labs:   Basic Metabolic Panel: Recent Labs  Lab 04/20/21 0945 04/21/21 0245 04/22/21 0348 04/23/21 0102 04/24/21 0317  NA 137 134* 137 134* 133*  K 3.7 4.3 5.4* 4.5 4.4  CL 95* 93* 94* 91* 93*  CO2 32 30 32 31 28  GLUCOSE 132* 93 136* 199* 171*  BUN 13 16 21* 27* 33*  CREATININE 1.12 1.36* 1.27* 1.30* 1.25*  CALCIUM 9.5 9.4 10.3 10.1 10.1   GFR Estimated Creatinine Clearance: 79 mL/min (A) (by C-G formula based on SCr of 1.25 mg/dL (H)). Liver Function Tests: No results for input(s): AST, ALT, ALKPHOS, BILITOT, PROT, ALBUMIN in the last 168 hours. No results for input(s): LIPASE, AMYLASE in the last 168 hours. No results for input(s): AMMONIA in the last 168 hours. Coagulation profile No results for input(s): INR, PROTIME in the last 168 hours.  CBC: Recent Labs  Lab 04/20/21 0945 04/21/21 0245 04/22/21 0348 04/23/21 0102 04/24/21 0317  WBC 5.8 5.4 6.1 7.2 6.4  NEUTROABS 3.8 3.2 4.4 5.6 4.9  HGB 12.2* 12.7* 13.0 13.6 13.8  HCT 37.0* 39.3 40.2 42.0 42.9  MCV 101.6* 102.3* 101.3* 102.2* 99.8  PLT 310 353 430* 488* 517*   Cardiac Enzymes: No results for input(s): CKTOTAL, CKMB, CKMBINDEX, TROPONINI in the last 168 hours. BNP: Invalid input(s): POCBNP CBG: Recent Labs  Lab 04/23/21 1146  GLUCAP 124*   D-Dimer No results for input(s): DDIMER in the last 72 hours. Hgb A1c No results for input(s): HGBA1C in the last 72 hours. Lipid Profile No results for input(s): CHOL, HDL, LDLCALC, TRIG, CHOLHDL, LDLDIRECT in the last 72 hours. Thyroid function studies No results for input(s): TSH, T4TOTAL, T3FREE, THYROIDAB in the last 72 hours.  Invalid input(s): FREET3 Anemia work up No results for input(s): VITAMINB12, FOLATE,  FERRITIN, TIBC, IRON, RETICCTPCT in the last 72 hours. Microbiology Recent Results (from the past 240 hour(s))  Resp Panel by RT-PCR (Flu A&B, Covid) Nasopharyngeal Swab     Status: None   Collection Time: 04/16/21  8:19 PM   Specimen: Nasopharyngeal Swab; Nasopharyngeal(NP) swabs in vial transport medium  Result Value Ref Range Status   SARS Coronavirus 2 by RT PCR NEGATIVE NEGATIVE Final    Comment: (NOTE) SARS-CoV-2 target nucleic acids are  NOT DETECTED.  The SARS-CoV-2 RNA is generally detectable in upper respiratory specimens during the acute phase of infection. The lowest concentration of SARS-CoV-2 viral copies this assay can detect is 138 copies/mL. A negative result does not preclude SARS-Cov-2 infection and should not be used as the sole basis for treatment or other patient management decisions. A negative result may occur with  improper specimen collection/handling, submission of specimen other than nasopharyngeal swab, presence of viral mutation(s) within the areas targeted by this assay, and inadequate number of viral copies(<138 copies/mL). A negative result must be combined with clinical observations, patient history, and epidemiological information. The expected result is Negative.  Fact Sheet for Patients:  EntrepreneurPulse.com.au  Fact Sheet for Healthcare Providers:  IncredibleEmployment.be  This test is no t yet approved or cleared by the [place] FDA and  has been authorized for detection and/or diagnosis of SARS-CoV-2 by FDA under an Emergency Use Authorization (EUA). This EUA will remain  in effect (meaning this test can be used) for the duration of the COVID-19 declaration under Section 564(b)(1) of the Act, 21 U.S.C.section 360bbb-3(b)(1), unless the authorization is terminated  or revoked sooner.       Influenza A by PCR NEGATIVE NEGATIVE Final   Influenza B by PCR NEGATIVE NEGATIVE Final    Comment:  (NOTE) The Xpert Xpress SARS-CoV-2/FLU/RSV plus assay is intended as an aid in the diagnosis of influenza from Nasopharyngeal swab specimens and should not be used as a sole basis for treatment. Nasal washings and aspirates are unacceptable for Xpert Xpress SARS-CoV-2/FLU/RSV testing.  Fact Sheet for Patients: EntrepreneurPulse.com.au  Fact Sheet for Healthcare Providers: IncredibleEmployment.be  This test is not yet approved or cleared by the [place] FDA and has been authorized for detection and/or diagnosis of SARS-CoV-2 by FDA under an Emergency Use Authorization (EUA). This EUA will remain in effect (meaning this test can be used) for the duration of the COVID-19 declaration under Section 564(b)(1) of the Act, 21 U.S.C. section 360bbb-3(b)(1), unless the authorization is terminated or revoked.  Performed at [facility], [place] [address], [place], [place] [address]   Blood culture (routine x 2)     Status: None   Collection Time: 04/16/21  8:55 PM   Specimen: BLOOD  Result Value Ref Range Status   Specimen Description BLOOD BLOOD LEFT HAND  Final   Special Requests   Final    BOTTLES DRAWN AEROBIC AND ANAEROBIC Blood Culture adequate volume   Culture   Final    NO GROWTH 5 DAYS Performed at [facility], [place] [address] [place], [place] [address]    Report Status 04/21/2021 FINAL  Final  Blood culture (routine x 2)     Status: None   Collection Time: 04/16/21  8:59 PM   Specimen: BLOOD  Result Value Ref Range Status   Specimen Description BLOOD RIGHT ANTECUBITAL  Final   Special Requests   Final    BOTTLES DRAWN AEROBIC AND ANAEROBIC Blood Culture adequate volume   Culture   Final    NO GROWTH 5 DAYS Performed at [facility], [place] [address] [place], [place] [address]    Report Status 04/21/2021 FINAL  Final  Culture, Respiratory w Gram Stain     Status: None (Preliminary result)   Collection Time: 04/23/21  10:56 AM   Specimen: Bronchoalveolar Lavage; Respiratory  Result Value Ref Range Status   Specimen Description BRONCHIAL ALVEOLAR LAVAGE  Final   Special Requests RLL  Final   Gram Stain   Final    RARE WBC PRESENT,BOTH PMN AND MONONUCLEAR NO ORGANISMS SEEN    Culture   Final    NO GROWTH < 24 HOURS Performed at [facility], [place] [address] [place], [place] [address]    Report Status PENDING  Incomplete  Pneumocystis smear by DFA     Status: None   Collection Time: 04/23/21 10:56 AM   Specimen: Bronchial Alveolar Lavage; Respiratory  Result Value Ref Range Status   Specimen Source-PJSRC RLL BAL  Final   Pneumocystis jiroveci Ag See Scanned report in [place]  Final    Comment: Performed at [place] Performed at [facility], [place] [address] [place], [place] [address]   Culture, Respiratory w Gram Stain     Status: None (Preliminary result)   Collection Time: 04/23/21 10:57 AM   Specimen: Bronchoalveolar Lavage; Respiratory  Result Value Ref Range Status   Specimen Description BRONCHIAL ALVEOLAR LAVAGE  Final   Special Requests LLL  Final   Gram Stain   Final    MODERATE WBC PRESENT,BOTH PMN AND MONONUCLEAR NO ORGANISMS SEEN    Culture   Final    NO GROWTH < 24 HOURS Performed at [facility], [place] [address] [place], [place] [address]    Report Status PENDING  Incomplete    Time coordinating discharge: 35 minutes  Signed: Nike Southwell  [facility] 04/24/2021, 11:49 AM

## 2021-04-24 NOTE — Progress Notes (Signed)
DC instructions given with assistance of translator. Patient verbalized understanding. Medications delivered from [facility] both regular meds and HIV medication. Stressed fact that if he has any shortness of breath, chest pain, weight gain etc to report to ER and to call his MD thru [facility]. He is set up with appointments thru the [facility] clinic.  Verbalized understanding of all instructions and was dc with friend to home.  Work release given as well. Refused wheelchair and walked without shortness of breath or pain to exit A1

## 2021-04-24 NOTE — Progress Notes (Signed)
[facility]  Date of Admission:  04/16/2021     Total days of antibiotics 5         ASSESSMENT:  [name] respiratory cultures from his bronchoscopy are without growth in 24 hours. Clinically appears to be improving and on room air during visit. Has follow up with pulmonology for focal bronchiectasis. No apparent infection at present that require additional antibiotics. Continue ART with Symtuza and Tivicay supplemented with Bactrim.  Wean off oxygen as tolerated. Has follow up with ID on 2/13.  ID will continue to monitor peripherally. Remaining medical and supportive care per primary team.   PLAN:  Continue current dose of Symtuza, Tivicay and Bactrim. Monitor specimens for cultures growth. Wean oxygen as tolerated.  Has follow up on 2/13 in ID office. Remaining medical and supportive care per primary team.  Principal Problem:   Community acquired pneumonia Active Problems:   HIV (human immunodeficiency virus infection) ([place])   Chronic systolic CHF (congestive heart failure) ([name])   Acute respiratory failure with hypoxia ([name])   Type 2 diabetes mellitus ([place])   History of tuberculosis   History of Pneumocystis jirovecii pneumonia   Stage 3b chronic kidney disease (CKD) ([name]) - baseline SCr 1.39-1.72   History of pulmonary embolus (PE)   S/P colostomy ([name])   Pruritic dermatitis   Bronchiectasis, tuberculous, bacteriologic/histologic exam unknown    Darunavir-Cobicistat-Emtricitabine-Tenofovir Alafenamide  1 tablet Oral Q breakfast   dolutegravir  50 mg Oral Daily   guaiFENesin  5 mL Oral Q4H   heparin  5,000 Units Subcutaneous Q8H   sulfamethoxazole-trimethoprim  1 tablet Oral Daily   triamcinolone 0.1 % cream : eucerin   Topical TID    SUBJECTIVE:  Afebrile overnight with no acute events. Feeling better today. Oxygen is currently off. [name] translator is present via tablet to aid in communication.   Allergies  Allergen Reactions    Metformin And Related Swelling and Rash     Review of Systems: Review of Systems  Constitutional:  Negative for chills, fever and weight loss.  Respiratory:  Negative for cough, shortness of breath and wheezing.   Cardiovascular:  Negative for chest pain and leg swelling.  Gastrointestinal:  Negative for abdominal pain, constipation, diarrhea, nausea and vomiting.  Skin:  Negative for rash.     OBJECTIVE: Vitals:   04/23/21 1552 04/23/21 2009 04/24/21 0729 04/24/21 0900  BP: 102/72 105/70 111/88   Pulse: 78 80 79   Resp: _0 Temp: 97.7 F (36.5 C) 97.6 F (36.4 C) 97.7 F (36.5 C)   TempSrc: Oral Oral Oral   SpO2: 98% 94% 95% 100%  Weight:       Body mass index is 22.98 kg/m.  Physical Exam Constitutional:      General: He is not in acute distress.    Appearance: He is well-developed.  Cardiovascular:     Rate and Rhythm: Normal rate and regular rhythm.     Heart sounds: Normal heart sounds.  Pulmonary:     Effort: Pulmonary effort is normal.     Breath sounds: Normal breath sounds.  Skin:    General: Skin is warm and dry.  Neurological:     Mental Status: He is alert and oriented to person, place, and time.  Psychiatric:        Behavior: Behavior normal.        Thought Content: Thought content normal.        Judgment: Judgment normal.  Lab Results Lab Results  Component Value Date   WBC 6.4 04/24/2021   HGB 13.8 04/24/2021   HCT 42.9 04/24/2021   MCV 99.8 04/24/2021   PLT 517 (H) 04/24/2021    Lab Results  Component Value Date   CREATININE 1.25 (H) 04/24/2021   BUN 33 (H) 04/24/2021   NA 133 (L) 04/24/2021   K 4.4 04/24/2021   CL 93 (L) 04/24/2021   CO2 28 04/24/2021    Lab Results  Component Value Date   ALT 9 04/17/2021   AST 12 (L) 04/17/2021   ALKPHOS 50 04/17/2021   BILITOT 0.6 04/17/2021     Microbiology: Recent Results (from the past 240 hour(s))  Resp Panel by RT-PCR (Flu A&B, Covid) Nasopharyngeal Swab     Status:  None   Collection Time: 04/16/21  8:19 PM   Specimen: Nasopharyngeal Swab; Nasopharyngeal(NP) swabs in vial transport medium  Result Value Ref Range Status   SARS Coronavirus 2 by RT PCR NEGATIVE NEGATIVE Final    Comment: (NOTE) SARS-CoV-2 target nucleic acids are NOT DETECTED.  The SARS-CoV-2 RNA is generally detectable in upper respiratory specimens during the acute phase of infection. The lowest concentration of SARS-CoV-2 viral copies this assay can detect is 138 copies/mL. A negative result does not preclude SARS-Cov-2 infection and should not be used as the sole basis for treatment or other patient management decisions. A negative result may occur with  improper specimen collection/handling, submission of specimen other than nasopharyngeal swab, presence of viral mutation(s) within the areas targeted by this assay, and inadequate number of viral copies(<138 copies/mL). A negative result must be combined with clinical observations, patient history, and epidemiological information. The expected result is Negative.  Fact Sheet for Patients:  EntrepreneurPulse.com.au  Fact Sheet for Healthcare Providers:  IncredibleEmployment.be  This test is no t yet approved or cleared by the [place] FDA and  has been authorized for detection and/or diagnosis of SARS-CoV-2 by FDA under an Emergency Use Authorization (EUA). This EUA will remain  in effect (meaning this test can be used) for the duration of the COVID-19 declaration under Section 564(b)(1) of the Act, 21 U.S.C.section 360bbb-3(b)(1), unless the authorization is terminated  or revoked sooner.       Influenza A by PCR NEGATIVE NEGATIVE Final   Influenza B by PCR NEGATIVE NEGATIVE Final    Comment: (NOTE) The Xpert Xpress SARS-CoV-2/FLU/RSV plus assay is intended as an aid in the diagnosis of influenza from Nasopharyngeal swab specimens and should not be used as a sole basis for  treatment. Nasal washings and aspirates are unacceptable for Xpert Xpress SARS-CoV-2/FLU/RSV testing.  Fact Sheet for Patients: EntrepreneurPulse.com.au  Fact Sheet for Healthcare Providers: IncredibleEmployment.be  This test is not yet approved or cleared by the [place] FDA and has been authorized for detection and/or diagnosis of SARS-CoV-2 by FDA under an Emergency Use Authorization (EUA). This EUA will remain in effect (meaning this test can be used) for the duration of the COVID-19 declaration under Section 564(b)(1) of the Act, 21 U.S.C. section 360bbb-3(b)(1), unless the authorization is terminated or revoked.  Performed at [facility], [place] [address], [place], [place] [address]   Blood culture (routine x 2)     Status: None   Collection Time: 04/16/21  8:55 PM   Specimen: BLOOD  Result Value Ref Range Status   Specimen Description BLOOD BLOOD LEFT HAND  Final   Special Requests   Final    BOTTLES DRAWN AEROBIC AND  ANAEROBIC Blood Culture adequate volume   Culture   Final    NO GROWTH 5 DAYS Performed at [facility], [place] [address], [place], [place] [address]    Report Status 04/21/2021 FINAL  Final  Blood culture (routine x 2)     Status: None   Collection Time: 04/16/21  8:59 PM   Specimen: BLOOD  Result Value Ref Range Status   Specimen Description BLOOD RIGHT ANTECUBITAL  Final   Special Requests   Final    BOTTLES DRAWN AEROBIC AND ANAEROBIC Blood Culture adequate volume   Culture   Final    NO GROWTH 5 DAYS Performed at [facility], [place] [address], [place], [place] [address]    Report Status 04/21/2021 FINAL  Final  Culture, Respiratory w Gram Stain     Status: None (Preliminary result)   Collection Time: 04/23/21 10:56 AM   Specimen: Bronchoalveolar Lavage; Respiratory  Result Value Ref Range Status   Specimen Description BRONCHIAL ALVEOLAR LAVAGE  Final   Special Requests RLL  Final   Gram  Stain   Final    RARE WBC PRESENT,BOTH PMN AND MONONUCLEAR NO ORGANISMS SEEN    Culture   Final    NO GROWTH < 24 HOURS Performed at [facility], [place] [address] [place], [place] [address]    Report Status PENDING  Incomplete  Pneumocystis smear by DFA     Status: None   Collection Time: 04/23/21 10:56 AM   Specimen: Bronchial Alveolar Lavage; Respiratory  Result Value Ref Range Status   Specimen Source-PJSRC RLL BAL  Final   Pneumocystis jiroveci Ag See Scanned report in [place]  Final    Comment: Performed at [place] Performed at [facility], [place] [address], [place], [place] [address]   Culture, Respiratory w Gram Stain     Status: None (Preliminary result)   Collection Time: 04/23/21 10:57 AM   Specimen: Bronchoalveolar Lavage; Respiratory  Result Value Ref Range Status   Specimen Description BRONCHIAL ALVEOLAR LAVAGE  Final   Special Requests LLL  Final   Gram Stain   Final    MODERATE WBC PRESENT,BOTH PMN AND MONONUCLEAR NO ORGANISMS SEEN    Culture   Final    NO GROWTH < 24 HOURS Performed at [facility], [place] [address] [place], [place] [address]    Report Status PENDING  Incomplete     Terri Piedra, [facility]  04/24/2021  11:18 AM

## 2021-04-24 NOTE — Plan of Care (Signed)
  Problem: Activity: Goal: Ability to tolerate increased activity will improve Outcome: Progressing   Problem: Clinical Measurements: Goal: Ability to maintain a body temperature in the normal range will improve Outcome: Progressing   

## 2021-04-25 LAB — CULTURE, RESPIRATORY W GRAM STAIN
Culture: NO GROWTH
Culture: NO GROWTH

## 2021-04-28 ENCOUNTER — Encounter: Payer: Self-pay | Admitting: Internal Medicine

## 2021-04-28 ENCOUNTER — Ambulatory Visit: Payer: BC Managed Care – PPO

## 2021-04-28 ENCOUNTER — Ambulatory Visit (INDEPENDENT_AMBULATORY_CARE_PROVIDER_SITE_OTHER): Payer: BC Managed Care – PPO | Admitting: Internal Medicine

## 2021-04-28 ENCOUNTER — Other Ambulatory Visit: Payer: Self-pay

## 2021-04-28 VITALS — BP 104/65 | HR 78 | Resp 16 | Ht 73.0 in | Wt 171.4 lb

## 2021-04-28 DIAGNOSIS — B20 Human immunodeficiency virus [HIV] disease: Secondary | ICD-10-CM | POA: Diagnosis not present

## 2021-04-28 DIAGNOSIS — L299 Pruritus, unspecified: Secondary | ICD-10-CM | POA: Diagnosis not present

## 2021-04-28 DIAGNOSIS — Z79899 Other long term (current) drug therapy: Secondary | ICD-10-CM | POA: Diagnosis not present

## 2021-04-28 NOTE — Progress Notes (Signed)
RFV: follow up for hospitalization for CAP and HIV disease Patient ID: [name], male   DOB: 1970/11/06, 51 y.o.   MRN: [id number]  HPI [name] is a 51yo M who speaks swahili. Has advanced hiv disease. On tivicay-symtuza with poor health literacy. Here with translator.he states that he is sometimes still having difficulty getting medications. Has been in good health otherwise no fever, chills, nightsweats   Outpatient Encounter Medications as of 04/28/2021  Medication Sig   albuterol (VENTOLIN HFA) 108 (90 Base) MCG/ACT inhaler Inhale 2 puffs into the lungs every 6 (six) hours as needed for wheezing or shortness of breath.   Darunavir-Cobicistat-Emtricitabine-Tenofovir Alafenamide (SYMTUZA) 800-150-200-10 MG TABS Take 1 tablet by mouth daily with breakfast.   dolutegravir (TIVICAY) 50 MG tablet Take 1 tablet (50 mg total) by mouth daily.   guaiFENesin (ROBITUSSIN) 100 MG/5ML liquid Take 5 mLs by mouth every 4 (four) hours.   sulfamethoxazole-trimethoprim (BACTRIM DS) 800-160 MG tablet Take 1 tablet by mouth daily.   Triamcinolone Acetonide (TRIAMCINOLONE 0.1 % CREAM : EUCERIN) CREA Apply 1 application topically 3 (three) times daily as needed. (Patient not taking: Reported on 04/03/2021)   No facility-administered encounter medications on file as of 04/28/2021.     Patient Active Problem List   Diagnosis Date Noted   Bronchiectasis, tuberculous, bacteriologic/histologic exam unknown 04/22/2021   Community acquired pneumonia 04/16/2021   Pruritic dermatitis 04/03/2021   Facial swelling 04/03/2021   Hip pain    History of pulmonary embolus (PE) 02/06/2021   S/P colostomy ([place]) 02/06/2021   History of tuberculosis 11/01/2020   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   Stage 3b chronic kidney disease (CKD) ([place]) - baseline SCr 1.39-1.72 11/01/2020   Acute respiratory failure with hypoxia ([place]) 05/17/2019   Type 2 diabetes mellitus ([place]) 05/17/2019   Genital  warts 05/06/2018   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   Pulmonary emphysema ([place])    Condyloma 0000000   Chronic systolic CHF (congestive heart failure) ([place]) 04/24/2016   Normochromic normocytic anemia 02/25/2016   Protein-calorie malnutrition, severe 02/25/2016   AIDS (acquired immune deficiency syndrome) ([place])    S/P ORIF (open reduction internal fixation) fracture 03/21/2014   HIV (human immunodeficiency virus infection) ([place]) 09/18/2013   Refugee health examination 09/18/2013     Health Maintenance Due  Topic Date Due   FOOT EXAM  Never done   OPHTHALMOLOGY EXAM  Never done   URINE MICROALBUMIN  Never done   TETANUS/TDAP  Never done   Zoster Vaccines- Shingrix (1 of 2) Never done   COVID-19 Vaccine (2 - Pfizer risk series) 01/26/2020   INFLUENZA VACCINE  10/14/2020     Review of Systems 12 point ros is negative Physical Exam   There were no vitals taken for this visit.  Physical Exam  Constitutional: He is oriented to person, place, and time. He appears well-developed and well-nourished. No distress.  HENT:  Mouth/Throat: Oropharynx is clear and moist. No oropharyngeal exudate.  Cardiovascular: Normal rate, regular rhythm and normal heart sounds. Exam reveals no gallop and no friction rub.  No murmur heard.  Pulmonary/Chest: Effort normal and breath sounds normal. No respiratory distress. He has no wheezes.  Abdominal: Soft. Bowel sounds are normal. He exhibits no distension. There is no tenderness.  Lymphadenopathy:  He has no cervical adenopathy.  Neurological: He is alert and oriented to person, place, and time.  Skin: Skin is warm and dry. No rash noted. No erythema.  Psychiatric: He has  a normal mood and affect. His behavior is normal.   Lab Results  Component Value Date   CD4TCELL 9 (L) 04/16/2021   Lab Results  Component Value Date   CD4TABS 120 (L) 04/16/2021   CD4TABS 62 (L) 12/24/2020   CD4TABS <35 (L) 11/04/2020   Lab  Results  Component Value Date   HIV1RNAQUANT 50 02/08/2021   No results found for: HEPBSAB Lab Results  Component Value Date   LABRPR NON REACTIVE 11/02/2020    CBC Lab Results  Component Value Date   WBC 6.4 04/24/2021   RBC 4.30 04/24/2021   HGB 13.8 04/24/2021   HCT 42.9 04/24/2021   PLT 517 (H) 04/24/2021   MCV 99.8 04/24/2021   MCH 32.1 04/24/2021   MCHC 32.2 04/24/2021   RDW 11.9 04/24/2021   LYMPHSABS 1.0 04/24/2021   MONOABS 0.4 04/24/2021   EOSABS 0.0 04/24/2021    BMET Lab Results  Component Value Date   NA 133 (L) 04/24/2021   K 4.4 04/24/2021   CL 93 (L) 04/24/2021   CO2 28 04/24/2021   GLUCOSE 171 (H) 04/24/2021   BUN 33 (H) 04/24/2021   CREATININE 1.25 (H) 04/24/2021   CALCIUM 10.1 04/24/2021   GFRNONAA >60 04/24/2021   GFRAA 79 12/04/2019      Assessment and Plan  Advanced hiv disease= continue on tivicay-symutza. Cd 4 count of 120/VL 50  Long term medication = cr appears stable  Oi proph = will need to continue on bactrim ds dailiy  Pruritic rash = consistent with eczema continue with eucerin:steroid cream

## 2021-05-01 ENCOUNTER — Telehealth: Payer: Self-pay

## 2021-05-01 NOTE — Telephone Encounter (Signed)
[facility] Patient Advocate Encounter  Patient's medications have been [facility]ri[facility] to [facility] and will be picked up .  Clearance Coots , CPhT Specialty Pharmacy Patient [facility] Phone: [phone] Fax:  [phone]

## 2021-05-05 ENCOUNTER — Other Ambulatory Visit: Payer: Self-pay

## 2021-05-05 ENCOUNTER — Telehealth: Payer: Self-pay

## 2021-05-05 MED ORDER — TRIAMCINOLONE 0.1 % CREAM:EUCERIN CREAM 1:1: 1.0000 "application " | TOPICAL_CREAM | Freq: Three times a day (TID) | CUTANEOUS | 0 refills | Status: DC | PRN

## 2021-05-05 MED ORDER — TRIAMCINOLONE 0.1 % CREAM:EUCERIN CREAM 1:1: 1.0000 "application " | TOPICAL_CREAM | Freq: Three times a day (TID) | CUTANEOUS | 2 refills | Status: DC | PRN

## 2021-05-05 NOTE — Addendum Note (Signed)
Addended by: Daisy Floro T on: 05/05/2021 10:41 AM   Modules accepted: Orders

## 2021-05-05 NOTE — Telephone Encounter (Signed)
Patient came by office office today requesting refill on triamcinolone cream. Ok to refill?

## 2021-05-05 NOTE — Telephone Encounter (Signed)
I spoke to the pharmacy and patient does have refills on file for the cream. Per pharmacy the smaller jar is the biggest that the cream comes in. Patient will follow up with dermatology as well.  [name] [name]

## 2021-05-08 ENCOUNTER — Inpatient Hospital Stay: Payer: BC Managed Care – PPO | Admitting: Pulmonary Disease

## 2021-05-08 NOTE — Progress Notes (Unsigned)
Synopsis: Referred in February 2023 for bronchiectasis by Ina Homes, MD  Subjective:   PATIENT ID: [name] GENDER: male DOB: April 16, 1970, MRN: [id number]   HPI  No chief complaint on file.  [name] is a 51 year old male, former smoker with HIV and history of DVT, pneumocystis jiroveci pneumonia and TB status post full treatment who was referred to pulmonary clinic for hospital follow-up.  Patient was admitted 2/2 to 2/9 for acute hypoxemic respiratory failure due to community-acquired pneumonia.  He completed 5-day course of IV ceftriaxone and remained hypoxemic. The inpatient pulmonary service evaluated the patient for left lower lobe bronchiectasis and for pneumonia in immunocompromised patient.   He underwent bronchoscopy with BAL on 2/8.  Cell count differential showed neutrophil predominance.  Cultures remain no growth to date.  Urine strep and Legionella antigens are negative.  Respiratory viral panel was negative.  He was seen by [facility] of infectious disease on 04/28/2021.  Past Medical History:  Diagnosis Date   Acute pulmonary embolism ([place]) 11/01/2020   Acute pulmonary embolus ([place]) 02/24/2016   Dx December 2017 taking Eliquis 25m BID   Bronchiectasis with acute exacerbation ([name])    Depression    "stress and depression for any man is common" (03/21/2014)   Diabetes mellitus without complication ([name])    DVT (deep venous thrombosis) ([place] 11/03/2020   Dyspnea    Genital warts 01/04/2017   Hepatitis    "I don't know what hepatitis I have"   HIV disease ([place]    MSSA bacteremia    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[facility] 04/24/2016   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[place]    TB (pulmonary tuberculosis)    previously treated according to refugee documentation     Family History  Problem Relation Age of Onset   Hypertension Other    Heart disease Sister      Social History   Socioeconomic History   Marital  status: Widowed    Spouse name: Not on file   Number of children: Not on file   Years of education: Not on file   Highest education level: Not on file  Occupational History   Not on file  Tobacco Use   Smoking status: Former    Packs/day: 0.50    Years: 6.00    Pack years: 3.00    Types: Cigarettes    Quit date: 03/16/2001    Years since quitting: 20.1   Smokeless tobacco: Never   Tobacco comments:    "quit smoking ~ 2003"  Vaping Use   Vaping Use: Never used  Substance and Sexual Activity   Alcohol use: Yes    Comment: drinks bottled beer intermittently   Drug use: No   Sexual activity: Not Currently    Partners: Female  Other Topics Concern   Not on file  Social History Narrative   As of 09/18/2013:   -[place] in [place], [place]    -[facility] [place]    -Language: [name], requires SPharmacist, community(speaks essentially no [place]   -Education: no formal education, 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 Reports continued tobacco abstinence.  Denies any bird or mold exposure. No pets currently. No recent travel.   Social Determinants of Health   Financial Resource Strain: Not on file  Food Insecurity: Not on file  Transportation Needs: Not on file  Physical Activity: Not on file  Stress: Not on file  Social Connections: Not on file  Intimate Partner Violence: Not on file     Allergies  Allergen Reactions   Metformin And Related Swelling and Rash     Outpatient Medications Prior to Visit  Medication Sig Dispense Refill   albuterol (VENTOLIN HFA) 108 (90 Base) MCG/ACT inhaler Inhale 2 puffs into the lungs every 6 (six) hours as needed for wheezing or shortness  of breath. 18 g 2   Darunavir-Cobicistat-Emtricitabine-Tenofovir Alafenamide (SYMTUZA) 800-150-200-10 MG TABS Take 1 tablet by mouth daily with breakfast. 30 tablet 2   dolutegravir (TIVICAY) 50 MG tablet Take 1 tablet (50 mg total) by mouth daily. 30 tablet 2   guaiFENesin (ROBITUSSIN) 100 MG/5ML liquid Take 5 mLs by mouth every 4 (four) hours. 120 mL 0   sulfamethoxazole-trimethoprim (BACTRIM DS) 800-160 MG tablet Take 1 tablet by mouth daily. 30 tablet 2   Triamcinolone Acetonide (TRIAMCINOLONE 0.1 % CREAM : EUCERIN) CREA Apply 1 application topically 3 (three) times daily as needed. 1 each 0   No facility-administered medications prior to visit.    ROS    Objective:  There were no vitals filed for this visit.   Physical Exam    CBC    Component Value Date/Time   WBC 6.4 04/24/2021 0317   RBC 4.30 04/24/2021 0317   HGB 13.8 04/24/2021 0317   HGB 13.6 04/23/2021 0102   HCT 42.9 04/24/2021 0317   HCT 26.3 (L) 11/11/2018 0151   PLT 517 (H) 04/24/2021 0317   MCV 99.8 04/24/2021 0317   MCH 32.1 04/24/2021 0317   MCHC 32.2 04/24/2021 0317   RDW 11.9 04/24/2021 0317   LYMPHSABS 1.0 04/24/2021 0317   MONOABS 0.4 04/24/2021 0317   EOSABS 0.0 04/24/2021 0317   BASOSABS 0.0 04/24/2021 0317     Chest imaging: CT Chest 04/22/21 New pulmonary consolidation in posterior right lower lobe, and mild patchy airspace disease in posterior right middle lobe, consistent with pneumonia.   Stable extensive left lung scarring and bronchiectasis, greatest in the left lower lobe.   Emphysema  PFT: No flowsheet data found.  Labs:  Path:  Echo:  Heart Catheterization:       Assessment & Plan:   No diagnosis found.  Discussion: ***    Current Outpatient Medications:    albuterol (VENTOLIN HFA) 108 (90 Base) MCG/ACT inhaler, Inhale 2 puffs into the lungs every 6 (six) hours as needed for wheezing or shortness of breath., Disp: 18 g, Rfl: 2    Darunavir-Cobicistat-Emtricitabine-Tenofovir Alafenamide (SYMTUZA) 800-150-200-10 MG TABS, Take 1 tablet by mouth daily with breakfast., Disp: 30 tablet, Rfl: 2   dolutegravir (TIVICAY) 50 MG tablet, Take 1 tablet (50 mg total) by mouth daily., Disp: 30 tablet, Rfl: 2   guaiFENesin (ROBITUSSIN) 100 MG/5ML liquid, Take 5 mLs by mouth every 4 (four) hours., Disp: 120 mL, Rfl: 0   sulfamethoxazole-trimethoprim (BACTRIM DS) 800-160 MG tablet, Take 1 tablet by mouth daily., Disp: 30 tablet, Rfl: 2   Triamcinolone Acetonide (TRIAMCINOLONE 0.1 % CREAM : EUCERIN) CREA, Apply 1 application topically 3 (three) times daily as needed., Disp: 1 each, Rfl: 0

## 2021-05-14 NOTE — Progress Notes (Deleted)
Patient ID: [name], male   DOB: Jul 08, 1970, 51 y.o.   MRN: [id number] ? ? ?After hospitalization 2/1-04/24/2021 ? ? ?Principal Problem: ?  Community acquired pneumonia ?Active Problems: ?  HIV (human immunodeficiency virus infection) ([place]) ?  Chronic systolic CHF (congestive heart failure) ([place]) ?  Acute respiratory failure with hypoxia ([place]) ?  Type 2 diabetes mellitus ([place]) ?  History of tuberculosis ?  History of Pneumocystis jirovecii pneumonia ?  Stage 3b chronic kidney disease (CKD) ([name]) - baseline SCr 1.39-1.72 ?  History of pulmonary embolus (PE) ?  S/P colostomy ([place]) ?  Pruritic dermatitis ?  Bronchiectasis, tuberculous, bacteriologic/histologic exam unknown ? ?Brief narrative: ?[name] is a 51 y.o. male who only speaks [name] with PMH significant for HIV, poor compliance to meds, underlying bronchiectasis with history of tuberculosis, history of pneumocystis pneumonia, reported history of chronic systolic heart failure with last echo in August 2022 with an EF of 45%, RV dysfunction with reduced EF, type 2 diabetes, DVT/PE (2017), history of colostomy. ?Patient presented to the ED on 04/16/2021 with complaint of 1 week history of shortness of breath, productive cough and generalized not feeling well for several days.  Patient states he ran out of Bactrim 3 days prior to presentation. ?  ?Chest x-ray on admission showed right lower lobe opacity ?Admitted to hospital service. ?ID was consulted. ? ?Hospital course: ?Community acquired pneumonia ?-Chest x-ray on admission with right lower lobe obesity ?-Completed 5-day course of IV Rocephin but continued to remain hypoxic. ?-Pulmonary consultation obtained.  2/7, CT chest showed new pulmonary consultation in posterior right lower lobe and mild patchy airspace disease in the posterior right middle lobe consistent with pneumonia. ?-On 2/8, patient underwent bronchoscopy and lavage by pulmonology. ?-No fever, WBC count normal. ?-He was able to  ambulate in the hallway without oxygen today. ?-Okay to discharge to home.  Per ID, patient does not need any additional antibiotics other than HIV meds and PCP prophylaxis. ?  ?Acute hypoxic respiratory failure ?-In the setting of chronic pulm issues that include bronchiectasis due to history of tuberculosis, pneumocystis pneumonia and mildly elevated pulmonary artery pressure. ?-Patient continued to require 3 L oxygen nasal cannula at rest till yesterday.  Improved after bronchoscopy and BAL.  Able to ambulate in the hallway today without supplemental oxygen.  ?-Continue inhalers, Mucinex.  ?  ?HIV (human immunodeficiency virus infection) ([place]) ?-Patient remains on his salvage regimen with Symtuza and Tivicay.   ?-Per ID, most recent viral load in November 2022 was pretty well suppressed at just 50 copies. ?-Continue Bactrim along with HIV meds. ?-Follow-up with ID as an outpatient ?  ?Pruritus ?-Itching better with topical therapy.  ?  ?Chronic systolic CHF ?-Last echo from August 2022 with EF 45 to 50%, LV global hypokinesis, mildly elevated pulmonary artery pressure to 35. ?-Heart rate and blood pressure stable.  He is not on any beta-blocker, ACE/ARB or diuretics. ?  ?Type 2 diabetes mellitus ?-Seems diet controlled at this time. ?  ?CKD 3B ?-Creatinine stable. ?Recent Labs (within last 365 days)  ?            ?Recent Labs  ?  02/09/21 ?0054 03/27/21 ?1202 04/16/21 ?1954 04/16/21 ?2108 04/17/21 ?0416 04/20/21 ?5188 04/21/21 ?0245 04/22/21 ?4166 04/23/21 ?0102 04/24/21 ?0630  ?BUN 35* _0 21* 27* 33*  ?CREATININE 1.36* 1.72* 1.46* 1.40* 1.31* 1.12 1.36* 1.27* 1.30* 1.25*  ?  ?  ?Hyperkalemia ?-Potassium level was elevated to 5.4  on 2/7.  Improved with 1 dose of Lokelma. ?Last Labs   ?       ?Recent Labs  ?Lab 04/20/21 ?6184 04/21/21 ?0245 04/22/21 ?8592 04/23/21 ?0102 04/24/21 ?7639  ?K 3.7 4.3 5.4* 4.5 4.4  ?  ?  ?History of DVT PE in 2017 ?-Seem to have completed a course of Eliquis. ?  ?S/P  colostomy ([place]) ?Chronic ?

## 2021-05-15 ENCOUNTER — Inpatient Hospital Stay: Payer: BC Managed Care – PPO | Admitting: Physician Assistant

## 2021-05-15 ENCOUNTER — Ambulatory Visit: Payer: Self-pay | Admitting: Allergy & Immunology

## 2021-05-15 DIAGNOSIS — E1169 Type 2 diabetes mellitus with other specified complication: Secondary | ICD-10-CM

## 2021-05-22 LAB — FUNGUS CULTURE RESULT

## 2021-05-22 LAB — FUNGAL ORGANISM REFLEX

## 2021-05-22 LAB — FUNGUS CULTURE WITH STAIN

## 2021-06-03 ENCOUNTER — Telehealth: Payer: Self-pay

## 2021-06-03 NOTE — Telephone Encounter (Signed)
[facility] Patient Advocate Encounter ? ?Patient's medications (Symtuza & Tivicay)have been [facility]riered [facility] and will be picked up 06/16/21. ? ?Clearance Coots , CPhT ?Specialty Pharmacy Patient Advocate ?[facility] ?Phone: [phone] ?Fax:  [phone]  ?

## 2021-06-04 ENCOUNTER — Emergency Department (HOSPITAL_COMMUNITY): Payer: BC Managed Care – PPO

## 2021-06-04 ENCOUNTER — Emergency Department (HOSPITAL_COMMUNITY)
Admission: EM | Admit: 2021-06-04 | Discharge: 2021-06-04 | Disposition: A | Discharge status: Home / Self Care | Payer: BC Managed Care – PPO | Attending: Emergency Medicine | Admitting: Emergency Medicine

## 2021-06-04 DIAGNOSIS — J4541 Moderate persistent asthma with (acute) exacerbation: Secondary | ICD-10-CM | POA: Insufficient documentation

## 2021-06-04 DIAGNOSIS — R000 Tachycardia, unspecified: Secondary | ICD-10-CM | POA: Diagnosis not present

## 2021-06-04 DIAGNOSIS — R0602 Shortness of breath: Secondary | ICD-10-CM | POA: Diagnosis present

## 2021-06-04 DIAGNOSIS — I5022 Chronic systolic (congestive) heart failure: Secondary | ICD-10-CM | POA: Insufficient documentation

## 2021-06-04 DIAGNOSIS — L309 Dermatitis, unspecified: Secondary | ICD-10-CM | POA: Diagnosis not present

## 2021-06-04 DIAGNOSIS — Z21 Asymptomatic human immunodeficiency virus [HIV] infection status: Secondary | ICD-10-CM | POA: Diagnosis not present

## 2021-06-04 DIAGNOSIS — E119 Type 2 diabetes mellitus without complications: Secondary | ICD-10-CM | POA: Diagnosis not present

## 2021-06-04 LAB — BASIC METABOLIC PANEL
Anion gap: 7 (ref 5–15)
BUN: 17 mg/dL (ref 6–20)
CO2: 29 mmol/L (ref 22–32)
Calcium: 9.2 mg/dL (ref 8.9–10.3)
Chloride: 107 mmol/L (ref 98–111)
Creatinine, Ser: 1.36 mg/dL — ABNORMAL HIGH (ref 0.61–1.24)
GFR, Estimated: >60 mL/min (ref >60)
Glucose, Bld: 86 mg/dL (ref 70–99)
Potassium: 3.8 mmol/L (ref 3.5–5.1)
Sodium: 143 mmol/L (ref 135–145)

## 2021-06-04 LAB — CBC WITH DIFFERENTIAL/PLATELET
Abs Immature Granulocytes: 0.01 10*3/uL (ref 0.00–0.07)
Basophils Absolute: 0 10*3/uL (ref 0.0–0.1)
Basophils Relative: 0 %
Eosinophils Absolute: 0.7 10*3/uL — ABNORMAL HIGH (ref 0.0–0.5)
Eosinophils Relative: 15 %
HCT: 44.4 % (ref 39.0–52.0)
Hemoglobin: 14.8 g/dL (ref 13.0–17.0)
Immature Granulocytes: 0 %
Lymphocytes Relative: 20 %
Lymphs Abs: 0.9 10*3/uL (ref 0.7–4.0)
MCH: 34.1 pg — ABNORMAL HIGH (ref 26.0–34.0)
MCHC: 33.3 g/dL (ref 30.0–36.0)
MCV: 102.3 fL — ABNORMAL HIGH (ref 80.0–100.0)
Monocytes Absolute: 0.4 10*3/uL (ref 0.1–1.0)
Monocytes Relative: 8 %
Neutro Abs: 2.6 10*3/uL (ref 1.7–7.7)
Neutrophils Relative %: 57 %
Platelets: 207 10*3/uL (ref 150–400)
RBC: 4.34 MIL/uL (ref 4.22–5.81)
RDW: 12.9 % (ref 11.5–15.5)
WBC: 4.5 10*3/uL (ref 4.0–10.5)
nRBC: 0 % (ref 0.0–0.2)

## 2021-06-04 MED ORDER — ALBUTEROL SULFATE (2.5 MG/3ML) 0.083% IN NEBU
5.0000 mg | INHALATION_SOLUTION | Freq: Once | RESPIRATORY_TRACT | Status: AC
Start: 2021-06-04 — End: 2021-06-04
  Administered 2021-06-04: 5 mg via RESPIRATORY_TRACT
  Filled 2021-06-04: qty 6

## 2021-06-04 MED ORDER — HYDROXYZINE HCL 25 MG PO TABS: 25.0000 mg | ORAL_TABLET | Freq: Four times a day (QID) | ORAL | 0 refills | Status: DC

## 2021-06-04 MED ORDER — IPRATROPIUM BROMIDE 0.02 % IN SOLN
0.5000 mg | Freq: Once | RESPIRATORY_TRACT | Status: AC
Start: 2021-06-04 — End: 2021-06-04
  Administered 2021-06-04: 0.5 mg via RESPIRATORY_TRACT
  Filled 2021-06-04: qty 2.5

## 2021-06-04 MED ORDER — DIPHENHYDRAMINE HCL 50 MG/ML IJ SOLN
25.0000 mg | Freq: Once | INTRAMUSCULAR | Status: AC
  Administered 2021-06-04: 25 mg via INTRAVENOUS
  Filled 2021-06-04: qty 1

## 2021-06-04 MED ORDER — CAMPHOR-MENTHOL 0.5-0.5 % EX LOTN
TOPICAL_LOTION | CUTANEOUS | Status: DC | PRN
  Filled 2021-06-04 (×2): qty 222

## 2021-06-04 MED ORDER — HYDROXYZINE HCL 25 MG PO TABS
25.0000 mg | ORAL_TABLET | Freq: Once | ORAL | Status: AC
  Administered 2021-06-04: 25 mg via ORAL
  Filled 2021-06-04: qty 1

## 2021-06-04 MED ORDER — PREDNISONE 20 MG PO TABS: 40.0000 mg | ORAL_TABLET | Freq: Every day | ORAL | 0 refills | Status: DC

## 2021-06-04 MED ORDER — METHYLPREDNISOLONE SODIUM SUCC 125 MG IJ SOLR
125.0000 mg | Freq: Once | INTRAMUSCULAR | Status: AC
  Administered 2021-06-04: 125 mg via INTRAVENOUS
  Filled 2021-06-04: qty 2

## 2021-06-04 NOTE — ED Provider Notes (Signed)
?[facility] EMERGENCY DEPARTMENT ?Provider Note ? ? ?CSN: [id number] ?Arrival date & time: 06/04/21  4128 ? ?  ? ?History ? ?Chief Complaint  ?Patient presents with  ? Shortness of Breath  ? Pruritis  ? ? ?[name] ROMEL DUMOND is a 51 y.o. male. ? ?Pt is a 51y/o male with hx of HIV, poor compliance to meds, underlying bronchiectasis with history of tuberculosis, history of pneumocystis pneumonia, reported history of chronic systolic heart failure with last echo in August 2022 with an EF of 45%,  type 2 diabetes, DVT/PE (2017), history of colostomy but reversed, and recurrent hospitalizations for multifocal PNA who is presenting today with c/o of worsening rash over the last 1 month but also worsening SOB in the last 2-3 days not improving with his inhaler.  Pt reports the rash started on his face and the has moved to involve his entire body.  He has been trying a prescription cream but the rash is getting worse and itches all the time.  He denies any recent fevers, productive cough, nausea vomiting or diarrhea.  He reports that he is currently compliant with his medications and continues to take his Bactrim.  Based on prior ID notes his last viral loads were controlled at about 50 ? ? ?Shortness of Breath ? ?  ? ?Home Medications ?Prior to Admission medications   ?Medication Sig Start Date End Date Taking? Authorizing Provider  ?albuterol (VENTOLIN HFA) 108 (90 Base) MCG/ACT inhaler Inhale 2 puffs into the lungs every 6 (six) hours as needed for wheezing or shortness of breath. 03/27/21  Yes Prosperi, Christian H, PA-C  ?Darunavir-Cobicistat-Emtricitabine-Tenofovir Alafenamide (SYMTUZA) 800-150-200-10 MG TABS Take 1 tablet by mouth daily with breakfast. 04/24/21 07/23/21 Yes Dahal, Melina Schools, MD  ?dolutegravir (TIVICAY) 50 MG tablet Take 1 tablet (50 mg total) by mouth daily. 04/24/21 07/23/21 Yes Dahal, Melina Schools, MD  ?hydrOXYzine (ATARAX) 25 MG tablet Take 1 tablet (25 mg total) by mouth every 6 (six) hours.  06/04/21  Yes Gwyneth Sprout, MD  ?predniSONE (DELTASONE) 20 MG tablet Take 2 tablets (40 mg total) by mouth daily. Take 2 tabs (40mg ) po for the next 7 days then take 1 tab (20mg ) for 3 days then 0.5 tab (10mg ) for 4 days 06/04/21  Yes , MD  ?sulfamethoxazole-trimethoprim (BACTRIM DS) 800-160 MG tablet Take 1 tablet by mouth daily. 04/24/21 07/23/21 Yes Dahal, Gwyneth Sprout, MD  ?Triamcinolone Acetonide (TRIAMCINOLONE 0.1 % CREAM : EUCERIN) CREA Apply 1 application topically 3 (three) times daily as needed. 05/05/21  Yes 09/22/21, NP  ?guaiFENesin (ROBITUSSIN) 100 MG/5ML liquid Take 5 mLs by mouth every 4 (four) hours. ?Patient not taking: Reported on 06/04/2021 04/24/21   Blanchard Kelch, MD  ?   ? ?Allergies    ?Metformin and related   ? ?Review of Systems   ?Review of Systems  ?Respiratory:  Positive for shortness of breath.   ? ?Physical Exam ?Updated Vital Signs ?BP (!) 146/85   Pulse 100   Temp 97.8 ?F (36.6 ?C) (Oral)   Resp (!) 22   SpO2 96%  ?Physical Exam ?Vitals and nursing note reviewed.  ?Constitutional:   ?   General: He is not in acute distress. ?   Appearance: He is well-developed.  ?   Comments: Appears uncomfortable constantly scratching  ?HENT:  ?   Head: Normocephalic and atraumatic.  ?Eyes:  ?   Conjunctiva/sclera: Conjunctivae normal.  ?   Pupils: Pupils are equal, round, and reactive to light.  ?Cardiovascular:  ?  Rate and Rhythm: Regular rhythm. Tachycardia present.  ?   Heart sounds: No murmur heard. ?Pulmonary:  ?   Effort: Pulmonary effort is normal. Tachypnea present. No respiratory distress.  ?   Breath sounds: Wheezing present. No rales.  ?Abdominal:  ?   General: There is no distension.  ?   Palpations: Abdomen is soft.  ?   Tenderness: There is no abdominal tenderness. There is no guarding or rebound.  ?Musculoskeletal:     ?   General: No tenderness. Normal range of motion.  ?   Cervical back: Normal range of motion and neck supple.  ?   Right lower leg: No edema.   ?   Left lower leg: No edema.  ?Skin: ?   General: Skin is warm and dry.  ?   Findings: Rash present. No erythema.  ?   Comments: Fine confluent papular erythematous, excoriated diffuse rash present over the entire body  ?Neurological:  ?   Mental Status: He is alert and oriented to person, place, and time. Mental status is at baseline.  ?Psychiatric:     ?   Mood and Affect: Mood normal.     ?   Behavior: Behavior normal.  ? ? ?ED Results / Procedures / Treatments   ?Labs ?(all labs ordered are listed, but only abnormal results are displayed) ?Labs Reviewed  ?CBC WITH DIFFERENTIAL/PLATELET - Abnormal; Notable for the following components:  ?    Result Value  ? MCV 102.3 (*)   ? MCH 34.1 (*)   ? Eosinophils Absolute 0.7 (*)   ? All other components within normal limits  ?BASIC METABOLIC PANEL - Abnormal; Notable for the following components:  ? Creatinine, Ser 1.36 (*)   ? All other components within normal limits  ? ? ?EKG ?EKG Interpretation ? ?Date/Time:  Wednesday June 04 2021 09:52:36 EDT ?Ventricular Rate:  94 ?PR Interval:  134 ?QRS Duration: 84 ?QT Interval:  352 ?QTC Calculation: 440 ?R Axis:   122 ?Text Interpretation: Normal sinus rhythm Indeterminate axis No significant change since last tracing When compared with ECG of 16-Apr-2021 21:49, PREVIOUS ECG IS PRESENT Confirmed by Gwyneth Sprout ([id number]) on 06/04/2021 11:20:13 AM ? ?Radiology ?DG Chest Port 1 View ? ?Result Date: 06/04/2021 ?CLINICAL DATA:  Shortness of breath for 2 weeks. EXAM: PORTABLE CHEST 1 VIEW COMPARISON:  04/16/2021 FINDINGS: Heart size remains stable. Pulmonary hyperinflation again seen, consistent with COPD. There has been interval improvement in airspace opacity in the left midlung in both lung bases since previous study. Left-sided pleural-parenchymal scarring is again noted. No evidence of pleural effusion. IMPRESSION: Near complete resolution of left midlung and bibasilar airspace disease since prior exam. No acute findings  COPD and left-sided pleural-parenchymal scarring. Electronically Signed   By: Danae Orleans M.D.   On: 06/04/2021 11:11   ? ?Procedures ?Procedures  ? ? ?Medications Ordered in ED ?Medications  ?camphor-menthol (SARNA) lotion (has no administration in time range)  ?methylPREDNISolone sodium succinate (SOLU-MEDROL) 125 mg/2 mL injection 125 mg (125 mg Intravenous Given 06/04/21 1055)  ?albuterol (PROVENTIL) (2.5 MG/3ML) 0.083% nebulizer solution 5 mg (5 mg Nebulization Given 06/04/21 1054)  ?ipratropium (ATROVENT) nebulizer solution 0.5 mg (0.5 mg Nebulization Given 06/04/21 1054)  ?diphenhydrAMINE (BENADRYL) injection 25 mg (25 mg Intravenous Given 06/04/21 1054)  ?hydrOXYzine (ATARAX) tablet 25 mg (25 mg Oral Given 06/04/21 1215)  ? ? ?ED Course/ Medical Decision Making/ A&P ?  ?                        ?  Medical Decision Making ?Amount and/or Complexity of Data Reviewed ?External Data Reviewed: notes. ?Labs: ordered. Decision-making details documented in ED Course. ?Radiology: ordered and independent interpretation performed. Decision-making details documented in ED Course. ?ECG/medicine tests: ordered and independent interpretation performed. Decision-making details documented in ED Course. ? ?Risk ?OTC drugs. ?Prescription drug management. ? ? ?Patient with numerous medical problems presenting today with complaints of shortness of breath but also of worsening rash.  Patient does have a diffuse papular rash present over the entire body that it is excoriated but does not appear infectious in nature.  He does have a history of pruritic dermatitis but he is still waiting to follow-up with a dermatologist.  However patient also has a history of asthma and reports worsening shortness of breath in the last few days that is not improved with his inhaler.  Patient is wheezing on exam but sats are 93% on room air.  He is mildly tachypneic.  Patient's external medical records from his recent hospitalization were reviewed.  As well  as his infectious disease notes.  Patient's last viral loads in February were 50 with a CD4 count of 120.  During patient's hospitalization in February he had a bronchoscopy done and findings came back negati

## 2021-06-04 NOTE — Discharge Instructions (Signed)
The earliest appointment they can get you with the dermatologist is [name] on April 17 at 430.  In the meantime there were new prescription sent to your pharmacy start taking those medications and hopefully that will help with the itching and the rash.  Continue to use your inhaler at home as needed.  Call your infectious disease doctor if you have any further problems. ?

## 2021-06-04 NOTE — ED Notes (Signed)
Walked patient up the hall with pulse oxy patienyt stayed at 90 room air and went up to 99 when I fixed his pulse oxy on his finger  ?

## 2021-06-04 NOTE — ED Notes (Signed)
Got patient into a gown on the monitor patient is resting with call bell in reach 

## 2021-06-04 NOTE — ED Provider Triage Note (Signed)
Emergency Medicine Provider Triage Evaluation Note ? ?[name] , a 51 y.o. male  was evaluated in triage.  Pt complains of itching for 2 weeks.  Reports that he has not changed any detergents, soaps, lotions and does not have any allergies that he knows of.  He says he does not have any new medications ? ?Review of Systems  ? ? ?Physical Exam  ?BP (!) 151/91   Pulse 100   Temp 97.8 ?F (36.6 ?C) (Oral)   Resp (!) 21   SpO2 93%  ?Gen:   Awake, no distress   ?Resp:  Normal effort  ?MSK:   Moves extremities without difficulty  ?Other:  Very dry skin, minor urticaria on flexor surfaces of upper extremities.  Some erythema to anterior chest.  Airway clear, tolerating secretions ? ?Medical Decision Making  ?Medically screening exam initiated at 10:17 AM.  Appropriate orders placed.  [name] was informed that the remainder of the evaluation will be completed by another provider, this initial triage assessment does not replace that evaluation, and the importance of remaining in the ED until their evaluation is complete. ? ? ?Per chart review, patient is HIV positive and has a history of tuberculosis.  Also was diagnosed with pruritic dermatitis in January. ?  ?Saddie Benders, PA-C ?06/04/21 1018 ? ?

## 2021-06-04 NOTE — ED Triage Notes (Signed)
Pt./ translator stated, [name] had some SOB but mostly itching all ove for 2 weeks. ?

## 2021-06-05 LAB — ACID FAST CULTURE WITH REFLEXED SENSITIVITIES (MYCOBACTERIA): Acid Fast Culture: NEGATIVE

## 2021-06-10 ENCOUNTER — Telehealth: Payer: Self-pay

## 2021-06-10 NOTE — Telephone Encounter (Signed)
-----   Message from [place] sent at 06/10/2021  9:08 AM EDT ----- ?Patient came into clinic stating he is out of medication, he is scheduled to see Dr. Drue Second on Monday but just kept repeating that he is out of medication..  ? ?

## 2021-06-10 NOTE — Telephone Encounter (Signed)
Patient aware medications are here at the office to pick up. Patient will be by today to pick up. ? ? ?Emmogene Simson P Tawsha Terrero, CMA ? ?

## 2021-06-13 ENCOUNTER — Telehealth: Payer: Self-pay | Admitting: Pulmonary Disease

## 2021-06-13 NOTE — Telephone Encounter (Signed)
Rec'd a request from [employer] for completion of FMLA paperwork.  I called them back and let them know patient has not been seen in our office. He was a no show for 05/08/21 appointment with Dr. Francine Graven and has not called to reschedule.  Gave [employer] rep name Dr. Judyann Munson to contact ?

## 2021-06-16 ENCOUNTER — Ambulatory Visit (INDEPENDENT_AMBULATORY_CARE_PROVIDER_SITE_OTHER): Payer: BC Managed Care – PPO | Admitting: Internal Medicine

## 2021-06-16 ENCOUNTER — Other Ambulatory Visit: Payer: Self-pay

## 2021-06-16 ENCOUNTER — Encounter: Payer: Self-pay | Admitting: Internal Medicine

## 2021-06-16 VITALS — BP 136/83 | HR 103 | Temp 97.6°F | Ht 66.93 in | Wt 177.0 lb

## 2021-06-16 DIAGNOSIS — L308 Other specified dermatitis: Secondary | ICD-10-CM | POA: Diagnosis not present

## 2021-06-16 DIAGNOSIS — Z79899 Other long term (current) drug therapy: Secondary | ICD-10-CM | POA: Diagnosis not present

## 2021-06-16 DIAGNOSIS — B20 Human immunodeficiency virus [HIV] disease: Secondary | ICD-10-CM

## 2021-06-16 MED ORDER — TRIAMCINOLONE ACETONIDE 0.1 % EX OINT: 1.0000 "application " | TOPICAL_OINTMENT | Freq: Two times a day (BID) | CUTANEOUS | 1 refills | Status: DC

## 2021-06-16 NOTE — Patient Instructions (Signed)
Also get eucerin lotion to apply to prevent dryness ?

## 2021-06-16 NOTE — Progress Notes (Signed)
?RFV: follow up for hiv disease ? ?Patient ID: [name], male   DOB: 1970/05/11, 51 y.o.   MRN: [id number] ? ?HPI ? ?[name], 51 yo M with HIV disease, Cd 4 count 120/VL 50.  On tivicay-symtuza. He reports takes daily. He reports that his ? ?Bactrim is finished. He reports that he was just naturalized as a [place] citizen on march 29th. He is doing well except for eczema still is pruritic. Using creams. ? ?Seeing him with assistance of video translator for swahili.  ? ? ?Outpatient Encounter Medications as of 06/16/2021  ?Medication Sig  ? albuterol (VENTOLIN HFA) 108 (90 Base) MCG/ACT inhaler Inhale 2 puffs into the lungs every 6 (six) hours as needed for wheezing or shortness of breath.  ? Darunavir-Cobicistat-Emtricitabine-Tenofovir Alafenamide (SYMTUZA) 800-150-200-10 MG TABS Take 1 tablet by mouth daily with breakfast.  ? dolutegravir (TIVICAY) 50 MG tablet Take 1 tablet (50 mg total) by mouth daily.  ? hydrOXYzine (ATARAX) 25 MG tablet Take 1 tablet (25 mg total) by mouth every 6 (six) hours.  ? predniSONE (DELTASONE) 20 MG tablet Take 2 tablets (40 mg total) by mouth daily. Take 2 tabs (40mg ) po for the next 7 days then take 1 tab (20mg ) for 3 days then 0.5 tab (10mg ) for 4 days  ? Triamcinolone Acetonide (TRIAMCINOLONE 0.1 % CREAM : EUCERIN) CREA Apply 1 application topically 3 (three) times daily as needed.  ? guaiFENesin (ROBITUSSIN) 100 MG/5ML liquid Take 5 mLs by mouth every 4 (four) hours. (Patient not taking: Reported on 06/04/2021)  ? sulfamethoxazole-trimethoprim (BACTRIM DS) 800-160 MG tablet Take 1 tablet by mouth daily. (Patient not taking: Reported on 06/16/2021)  ? ?No facility-administered encounter medications on file as of 06/16/2021.  ?  ? ?Patient Active Problem List  ? Diagnosis Date Noted  ? Bronchiectasis, tuberculous, bacteriologic/histologic exam unknown 04/22/2021  ? Community acquired pneumonia 04/16/2021  ? Pruritic dermatitis 04/03/2021  ? Facial swelling 04/03/2021  ? Hip pain    ? History of pulmonary embolus (PE) 02/06/2021  ? S/P colostomy ([place]) 02/06/2021  ? History of tuberculosis 11/01/2020  ? 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  ? Stage 3b chronic kidney disease (CKD) ([name]) - baseline SCr 1.39-1.72 11/01/2020  ? Acute respiratory failure with hypoxia ([place]) 05/17/2019  ? Type 2 diabetes mellitus ([place]) 05/17/2019  ? Genital warts 05/06/2018  ? 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  ? Pulmonary emphysema ([place])   ? Condyloma 01/04/2017  ? Chronic systolic CHF (congestive heart failure) ([place]) 04/24/2016  ? Normochromic normocytic anemia 02/25/2016  ? Protein-calorie malnutrition, severe 02/25/2016  ? AIDS (acquired immune deficiency syndrome) ([place])   ? S/P ORIF (open reduction internal fixation) fracture 03/21/2014  ? HIV (human immunodeficiency virus infection) ([place]) 09/18/2013  ? Refugee health examination 09/18/2013  ? ? ? ?Health Maintenance Due  ?Topic Date Due  ? FOOT EXAM  Never done  ? OPHTHALMOLOGY EXAM  Never done  ? URINE MICROALBUMIN  Never done  ? TETANUS/TDAP  Never done  ? Zoster Vaccines- Shingrix (1 of 2) Never done  ? COVID-19 Vaccine (2 - Pfizer risk series) 01/26/2020  ? HEMOGLOBIN A1C  05/04/2021  ?  ? ?Review of Systems ?Review of Systems  ?Constitutional: Negative for fever, chills, diaphoresis, activity change, appetite change, fatigue and unexpected weight change.  ?HENT: Negative for congestion, sore throat, rhinorrhea, sneezing, trouble swallowing and sinus pressure.  ?Eyes: Negative for photophobia and visual disturbance.  ?Respiratory: Negative for cough, chest tightness, shortness of breath, wheezing  and stridor.  ?Cardiovascular: Negative for chest pain, palpitations and leg swelling.  ?Gastrointestinal: Negative for nausea, vomiting, abdominal pain, diarrhea, constipation, blood in stool, abdominal distention and anal bleeding.  ?Genitourinary: Negative for dysuria, hematuria, flank pain and difficulty urinating.   ?Musculoskeletal: Negative for myalgias, back pain, joint swelling, arthralgias and gait problem.  ?Skin: Negative for color change, pallor, rash and wound.  ?Neurological: Negative for dizziness, tremors, weakness and light-headedness.  ?Hematological: Negative for adenopathy. Does not bruise/bleed easily.  ?Psychiatric/Behavioral: Negative for behavioral problems, confusion, sleep disturbance, dysphoric mood, decreased concentration and agitation.  ? ?Physical Exam  ? ?BP 136/83   Pulse (!) 103   Temp 97.6 ?F (36.4 ?C) (Oral)   Ht 5' 6.93" (1.7 m)   Wt 177 lb (80.3 kg)   SpO2 96%   BMI 27.78 kg/m?   ?Physical Exam  ?Constitutional: He is oriented to person, place, and time. He appears well-developed and well-nourished. No distress.  ?HENT:  ?Mouth/Throat: Oropharynx is clear and moist. No oropharyngeal exudate.  ?Cardiovascular: Normal rate, regular rhythm and normal heart sounds. Exam reveals no gallop and no friction rub.  ?No murmur heard.  ?Pulmonary/Chest: Effort normal and breath sounds normal. No respiratory distress. He has no wheezes.  ?Abdominal: Soft. Bowel sounds are normal. He exhibits no distension. There is no tenderness.  ?Lymphadenopathy:  ?He has no cervical adenopathy.  ?Neurological: He is alert and oriented to person, place, and time.  ?Skin: Skin is warm and dry. No rash noted. No erythema. +patches of excoriation ?Psychiatric: He has a normal mood and affect. His behavior is normal.  ? ?Lab Results  ?Component Value Date  ? CD4TCELL 9 (L) 04/16/2021  ? ?Lab Results  ?Component Value Date  ? CD4TABS 120 (L) 04/16/2021  ? CD4TABS 62 (L) 12/24/2020  ? CD4TABS <35 (L) 11/04/2020  ? ?Lab Results  ?Component Value Date  ? HIV1RNAQUANT 50 02/08/2021  ? ?No results found for: HEPBSAB ?Lab Results  ?Component Value Date  ? LABRPR NON REACTIVE 11/02/2020  ? ? ?CBC ?Lab Results  ?Component Value Date  ? WBC 4.5 06/04/2021  ? RBC 4.34 06/04/2021  ? HGB 14.8 06/04/2021  ? HCT 44.4 06/04/2021  ?  PLT 207 06/04/2021  ? MCV 102.3 (H) 06/04/2021  ? MCH 34.1 (H) 06/04/2021  ? MCHC 33.3 06/04/2021  ? RDW 12.9 06/04/2021  ? LYMPHSABS 0.9 06/04/2021  ? MONOABS 0.4 06/04/2021  ? EOSABS 0.7 (H) 06/04/2021  ? ? ?BMET ?Lab Results  ?Component Value Date  ? NA 143 06/04/2021  ? K 3.8 06/04/2021  ? CL 107 06/04/2021  ? CO2 29 06/04/2021  ? GLUCOSE 86 06/04/2021  ? BUN 17 06/04/2021  ? CREATININE 1.36 (H) 06/04/2021  ? CALCIUM 9.2 06/04/2021  ? GFRNONAA >60 06/04/2021  ? GFRAA 79 12/04/2019  ? ? ? ? ?Assessment and Plan ? ? ?HIV = will check Labs today. Also will refill meds and prescribe bactrim ds daily ? ?Eczema = will prescribe ointment (which is more vaseline based) also recommend to use eucerin cream in addition ? ?Htn = though it was reported as his workplace as elevated BP; it is not elevated in clinic. Will provide letter for him ? ?Chronic kidney disease 2 /long term medication management= stable ? ? ?

## 2021-06-17 LAB — T-HELPER CELLS (CD4) COUNT (NOT AT ARMC)
CD4 % Helper T Cell: 13 % — ABNORMAL LOW (ref 33–65)
CD4 T Cell Abs: 106 /uL — ABNORMAL LOW (ref 400–1790)

## 2021-06-18 LAB — HIV-1 RNA QUANT-NO REFLEX-BLD
HIV 1 RNA Quant: <20 Copies/mL — ABNORMAL HIGH
HIV-1 RNA Quant, Log: <1.3 Log cps/mL — ABNORMAL HIGH

## 2021-06-30 ENCOUNTER — Ambulatory Visit: Payer: BC Managed Care – PPO | Admitting: Dermatology

## 2021-07-01 LAB — ACID FAST CULTURE WITH REFLEXED SENSITIVITIES (MYCOBACTERIA): Acid Fast Culture: NEGATIVE

## 2021-07-09 ENCOUNTER — Other Ambulatory Visit (HOSPITAL_COMMUNITY): Payer: Self-pay

## 2021-07-17 ENCOUNTER — Telehealth: Payer: Self-pay

## 2021-07-17 NOTE — Telephone Encounter (Signed)
[facility] Patient Advocate Encounter ° °Patient's medications have been couriered to [facility] from [facility] and will be picked up . ° °Hyacinth Marcelli , CPhT °Specialty Pharmacy Patient Advocate °[facility] °Phone: [phone] °Fax:  [phone]  °

## 2021-07-24 ENCOUNTER — Other Ambulatory Visit: Payer: Self-pay | Admitting: Internal Medicine

## 2021-07-24 ENCOUNTER — Other Ambulatory Visit: Payer: Self-pay

## 2021-07-24 DIAGNOSIS — B20 Human immunodeficiency virus [HIV] disease: Secondary | ICD-10-CM

## 2021-07-24 MED ORDER — SULFAMETHOXAZOLE-TRIMETHOPRIM 800-160 MG PO TABS: 1.0000 | ORAL_TABLET | Freq: Every day | ORAL | 1 refills | Status: DC

## 2021-08-04 ENCOUNTER — Ambulatory Visit (INDEPENDENT_AMBULATORY_CARE_PROVIDER_SITE_OTHER): Payer: BC Managed Care – PPO | Admitting: Internal Medicine

## 2021-08-04 ENCOUNTER — Other Ambulatory Visit: Payer: Self-pay

## 2021-08-04 ENCOUNTER — Encounter: Payer: Self-pay | Admitting: Internal Medicine

## 2021-08-04 VITALS — BP 104/69 | HR 97 | Temp 97.2°F | Wt 172.6 lb

## 2021-08-04 DIAGNOSIS — B20 Human immunodeficiency virus [HIV] disease: Secondary | ICD-10-CM

## 2021-08-04 DIAGNOSIS — J189 Pneumonia, unspecified organism: Secondary | ICD-10-CM

## 2021-08-04 DIAGNOSIS — Z556 Problems related to health literacy: Secondary | ICD-10-CM

## 2021-08-04 DIAGNOSIS — L308 Other specified dermatitis: Secondary | ICD-10-CM

## 2021-08-04 MED ORDER — TIVICAY 50 MG PO TABS: ORAL_TABLET | ORAL | 5 refills | Status: DC

## 2021-08-04 MED ORDER — TRIAMCINOLONE ACETONIDE 0.1 % EX OINT: 1.0000 "application " | TOPICAL_OINTMENT | Freq: Two times a day (BID) | CUTANEOUS | 1 refills | Status: DC

## 2021-08-04 MED ORDER — SYMTUZA 800-150-200-10 MG PO TABS: ORAL_TABLET | ORAL | 5 refills | Status: DC

## 2021-08-04 MED ORDER — GUAIFENESIN 100 MG/5ML PO LIQD: 5.0000 mL | ORAL | 0 refills | Status: DC

## 2021-08-04 MED ORDER — AZITHROMYCIN 250 MG PO TABS: ORAL_TABLET | ORAL | 0 refills | Status: DC

## 2021-08-04 NOTE — Progress Notes (Signed)
RFV: follow up for hiv disease  Patient ID: [name], male   DOB: 01-02-1971, 51 y.o.   MRN: [id number]  HPI CD 4 count of 106/VL<20 ( April 2023), ckd 3 on tivicay/symtuza plus bactrim oi proph  Has had chronic cough for the past 4 weeks. No shortness of breath or wheezing.usually productive cough in the morning then better there after. He thinks it was improving but most recently worsening. He does cough at night that keeps him up at night. No other related symptoms - no fever/ns/chills/  Does not smoke  Outpatient Encounter Medications as of 08/04/2021  Medication Sig   albuterol (VENTOLIN HFA) 108 (90 Base) MCG/ACT inhaler Inhale 2 puffs into the lungs every 6 (six) hours as needed for wheezing or shortness of breath.   guaiFENesin (ROBITUSSIN) 100 MG/5ML liquid Take 5 mLs by mouth every 4 (four) hours. (Patient not taking: Reported on 06/04/2021)   hydrOXYzine (ATARAX) 25 MG tablet Take 1 tablet (25 mg total) by mouth every 6 (six) hours.   predniSONE (DELTASONE) 20 MG tablet Take 2 tablets (40 mg total) by mouth daily. Take 2 tabs (40mg ) po for the next 7 days then take 1 tab (20mg ) for 3 days then 0.5 tab (10mg ) for 4 days   sulfamethoxazole-trimethoprim (BACTRIM DS) 800-160 MG tablet Take 1 tablet by mouth daily.   SYMTUZA 800-150-200-10 MG TABS TAKE 1 TABLET BY MOUTH 1 TIME A DAY WITH BREAKFAST   TIVICAY 50 MG tablet TAKE ONE TABLET BY MOUTH ONCE DAILY. STORE AT ROOM TEMPERATURE   Triamcinolone Acetonide (TRIAMCINOLONE 0.1 % CREAM : EUCERIN) CREA Apply 1 application topically 3 (three) times daily as needed.   triamcinolone ointment (KENALOG) 0.1 % Apply 1 application. topically 2 (two) times daily.   No facility-administered encounter medications on file as of 08/04/2021.     Patient Active Problem List   Diagnosis Date Noted   Bronchiectasis, tuberculous, bacteriologic/histologic exam unknown 04/22/2021   Community acquired pneumonia 04/16/2021   Pruritic dermatitis  04/03/2021   Facial swelling 04/03/2021   Hip pain    History of pulmonary embolus (PE) 02/06/2021   S/P colostomy ([place]) 02/06/2021   History of tuberculosis 11/01/2020   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   Stage 3b chronic kidney disease (CKD) ([place]) - baseline SCr 1.39-1.72 11/01/2020   Acute respiratory failure with hypoxia ([place]) 05/17/2019   Type 2 diabetes mellitus ([place]) 05/17/2019   Genital warts 05/06/2018   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   Pulmonary emphysema ([place])    Condyloma 0000000   Chronic systolic CHF (congestive heart failure) ([place]) 04/24/2016   Normochromic normocytic anemia 02/25/2016   Protein-calorie malnutrition, severe 02/25/2016   AIDS (acquired immune deficiency syndrome) ([place])    S/P ORIF (open reduction internal fixation) fracture 03/21/2014   HIV (human immunodeficiency virus infection) ([place]) 09/18/2013   [facility] health examination 09/18/2013     Health Maintenance Due  Topic Date Due   FOOT EXAM  Never done   OPHTHALMOLOGY EXAM  Never done   URINE MICROALBUMIN  Never done   TETANUS/TDAP  Never done   Zoster Vaccines- Shingrix (1 of 2) Never done   COVID-19 Vaccine (2 - Pfizer risk series) 01/26/2020   HEMOGLOBIN A1C  05/04/2021     Review of Systems 12 point ros is negative per hpi  Physical Exam  BP 104/69   Pulse 97   Temp (!) 97.2 F (36.2 C) (Temporal)   Wt 172 lb 9.6 oz (78.3 kg)  SpO2 93%   BMI 27.09 kg/m  Physical Exam  Constitutional: He is oriented to person, place, and time. He appears well-developed and well-nourished. No distress.  HENT:  Mouth/Throat: Oropharynx is clear and moist. No oropharyngeal exudate.  Cardiovascular: Normal rate, regular rhythm and normal heart sounds. Exam reveals no gallop and no friction rub.  No murmur heard.  Pulmonary/Chest: Effort normal and breath sounds normal. No respiratory distress. He has no wheezes.  Abdominal: Soft. Bowel sounds are  normal. He exhibits no distension. There is no tenderness.  Lymphadenopathy:  He has no cervical adenopathy.  Neurological: He is alert and oriented to person, place, and time.  Skin: Skin is warm and dry. No rash noted. No erythema.  Psychiatric: He has a normal mood and affect. His behavior is normal.    Lab Results  Component Value Date   CD4TCELL 13 (L) 06/16/2021   Lab Results  Component Value Date   CD4TABS 106 (L) 06/16/2021   CD4TABS 120 (L) 04/16/2021   CD4TABS 62 (L) 12/24/2020   Lab Results  Component Value Date   HIV1RNAQUANT <20 (H) 06/16/2021   No results found for: HEPBSAB Lab Results  Component Value Date   LABRPR NON REACTIVE 11/02/2020    CBC Lab Results  Component Value Date   WBC 4.5 06/04/2021   RBC 4.34 06/04/2021   HGB 14.8 06/04/2021   HCT 44.4 06/04/2021   PLT 207 06/04/2021   MCV 102.3 (H) 06/04/2021   MCH 34.1 (H) 06/04/2021   MCHC 33.3 06/04/2021   RDW 12.9 06/04/2021   LYMPHSABS 0.9 06/04/2021   MONOABS 0.4 06/04/2021   EOSABS 0.7 (H) 06/04/2021    BMET Lab Results  Component Value Date   NA 143 06/04/2021   K 3.8 06/04/2021   CL 107 06/04/2021   CO2 29 06/04/2021   GLUCOSE 86 06/04/2021   BUN 17 06/04/2021   CREATININE 1.36 (H) 06/04/2021   CALCIUM 9.2 06/04/2021   GFRNONAA >60 06/04/2021   GFRAA 79 12/04/2019      Assessment and Plan Hiv disease= Refilled symtuza and tivicay; reminded to continue to take both daily in addn to bactrim  Low health literacy = counseled patient on importance on picking up his meds in time to take all together. Reviewed with interpreter.  Long term medication managemnet = cr remains stable in 1.2-1.36 range. Will check labs at next visit  Oi proph = continue on bactrim ds daily  Pneumonia = concern having secondary bacterial infection. Will give a course of aizthromycin and cough syrup medication to help at night to see if improvement  Eczema = refilled topical steroid cream

## 2021-08-05 ENCOUNTER — Telehealth: Payer: Self-pay

## 2021-08-05 NOTE — Telephone Encounter (Signed)
Initiated PA for Triamcinolone 0.1% ointment through covermymeds.  Waiting on response. Should have outcome within 24 hours.  Juanita Laster, RMA

## 2021-08-06 NOTE — Telephone Encounter (Signed)
PA was denied. Appeal paperwork placed in provider's box.  Sandie Ano, RN

## 2021-08-22 ENCOUNTER — Encounter (HOSPITAL_COMMUNITY): Payer: Self-pay | Admitting: Emergency Medicine

## 2021-08-22 ENCOUNTER — Other Ambulatory Visit: Payer: Self-pay

## 2021-08-22 ENCOUNTER — Emergency Department (HOSPITAL_COMMUNITY): Payer: BC Managed Care – PPO

## 2021-08-22 ENCOUNTER — Inpatient Hospital Stay (HOSPITAL_COMMUNITY)
Admission: EM | Admit: 2021-08-22 | Discharge: 2021-08-25 | DRG: 193 | Disposition: A | Discharge status: Home / Self Care | Payer: BC Managed Care – PPO | Attending: Internal Medicine | Admitting: Internal Medicine

## 2021-08-22 DIAGNOSIS — I248 Other forms of acute ischemic heart disease: Secondary | ICD-10-CM | POA: Diagnosis present

## 2021-08-22 DIAGNOSIS — E1165 Type 2 diabetes mellitus with hyperglycemia: Secondary | ICD-10-CM | POA: Diagnosis present

## 2021-08-22 DIAGNOSIS — Z8611 Personal history of tuberculosis: Secondary | ICD-10-CM

## 2021-08-22 DIAGNOSIS — N1832 Chronic kidney disease, stage 3b: Secondary | ICD-10-CM | POA: Diagnosis present

## 2021-08-22 DIAGNOSIS — Z933 Colostomy status: Secondary | ICD-10-CM | POA: Diagnosis not present

## 2021-08-22 DIAGNOSIS — I5082 Biventricular heart failure: Secondary | ICD-10-CM | POA: Diagnosis present

## 2021-08-22 DIAGNOSIS — J9601 Acute respiratory failure with hypoxia: Secondary | ICD-10-CM | POA: Diagnosis present

## 2021-08-22 DIAGNOSIS — J439 Emphysema, unspecified: Secondary | ICD-10-CM

## 2021-08-22 DIAGNOSIS — Z87891 Personal history of nicotine dependence: Secondary | ICD-10-CM

## 2021-08-22 DIAGNOSIS — J441 Chronic obstructive pulmonary disease with (acute) exacerbation: Secondary | ICD-10-CM | POA: Diagnosis not present

## 2021-08-22 DIAGNOSIS — E559 Vitamin D deficiency, unspecified: Secondary | ICD-10-CM | POA: Diagnosis present

## 2021-08-22 DIAGNOSIS — Z86711 Personal history of pulmonary embolism: Secondary | ICD-10-CM | POA: Diagnosis not present

## 2021-08-22 DIAGNOSIS — Z86718 Personal history of other venous thrombosis and embolism: Secondary | ICD-10-CM | POA: Diagnosis not present

## 2021-08-22 DIAGNOSIS — J479 Bronchiectasis, uncomplicated: Secondary | ICD-10-CM | POA: Diagnosis not present

## 2021-08-22 DIAGNOSIS — K219 Gastro-esophageal reflux disease without esophagitis: Secondary | ICD-10-CM | POA: Diagnosis present

## 2021-08-22 DIAGNOSIS — E876 Hypokalemia: Secondary | ICD-10-CM

## 2021-08-22 DIAGNOSIS — J101 Influenza due to other identified influenza virus with other respiratory manifestations: Secondary | ICD-10-CM | POA: Diagnosis present

## 2021-08-22 DIAGNOSIS — I5032 Chronic diastolic (congestive) heart failure: Secondary | ICD-10-CM | POA: Diagnosis present

## 2021-08-22 DIAGNOSIS — Z20822 Contact with and (suspected) exposure to covid-19: Secondary | ICD-10-CM | POA: Diagnosis present

## 2021-08-22 DIAGNOSIS — H109 Unspecified conjunctivitis: Secondary | ICD-10-CM | POA: Diagnosis present

## 2021-08-22 DIAGNOSIS — J471 Bronchiectasis with (acute) exacerbation: Secondary | ICD-10-CM | POA: Diagnosis not present

## 2021-08-22 DIAGNOSIS — J470 Bronchiectasis with acute lower respiratory infection: Secondary | ICD-10-CM | POA: Diagnosis present

## 2021-08-22 DIAGNOSIS — Z888 Allergy status to other drugs, medicaments and biological substances status: Secondary | ICD-10-CM

## 2021-08-22 DIAGNOSIS — R0902 Hypoxemia: Secondary | ICD-10-CM

## 2021-08-22 DIAGNOSIS — T380X5A Adverse effect of glucocorticoids and synthetic analogues, initial encounter: Secondary | ICD-10-CM | POA: Diagnosis present

## 2021-08-22 DIAGNOSIS — B20 Human immunodeficiency virus [HIV] disease: Secondary | ICD-10-CM | POA: Diagnosis present

## 2021-08-22 DIAGNOSIS — E1122 Type 2 diabetes mellitus with diabetic chronic kidney disease: Secondary | ICD-10-CM | POA: Diagnosis present

## 2021-08-22 DIAGNOSIS — Z79899 Other long term (current) drug therapy: Secondary | ICD-10-CM

## 2021-08-22 DIAGNOSIS — I509 Heart failure, unspecified: Secondary | ICD-10-CM

## 2021-08-22 LAB — HEPATIC FUNCTION PANEL
ALT: 15 U/L (ref 0–44)
AST: 24 U/L (ref 15–41)
Albumin: 3.5 g/dL (ref 3.5–5.0)
Alkaline Phosphatase: 65 U/L (ref 38–126)
Bilirubin, Direct: <0.1 mg/dL (ref 0.0–0.2)
Total Bilirubin: 0.8 mg/dL (ref 0.3–1.2)
Total Protein: 7.8 g/dL (ref 6.5–8.1)

## 2021-08-22 LAB — LACTIC ACID, PLASMA: Lactic Acid, Venous: 0.8 mmol/L (ref 0.5–1.9)

## 2021-08-22 LAB — CBC
HCT: 40.6 % (ref 39.0–52.0)
Hemoglobin: 14.1 g/dL (ref 13.0–17.0)
MCH: 34.5 pg — ABNORMAL HIGH (ref 26.0–34.0)
MCHC: 34.7 g/dL (ref 30.0–36.0)
MCV: 99.3 fL (ref 80.0–100.0)
Platelets: 175 10*3/uL (ref 150–400)
RBC: 4.09 MIL/uL — ABNORMAL LOW (ref 4.22–5.81)
RDW: 12.7 % (ref 11.5–15.5)
WBC: 7.3 10*3/uL (ref 4.0–10.5)
nRBC: 0 % (ref 0.0–0.2)

## 2021-08-22 LAB — BASIC METABOLIC PANEL
Anion gap: 9 (ref 5–15)
BUN: 16 mg/dL (ref 6–20)
CO2: 25 mmol/L (ref 22–32)
Calcium: 8.9 mg/dL (ref 8.9–10.3)
Chloride: 101 mmol/L (ref 98–111)
Creatinine, Ser: 1.17 mg/dL (ref 0.61–1.24)
GFR, Estimated: >60 mL/min (ref >60)
Glucose, Bld: 106 mg/dL — ABNORMAL HIGH (ref 70–99)
Potassium: 3.3 mmol/L — ABNORMAL LOW (ref 3.5–5.1)
Sodium: 135 mmol/L (ref 135–145)

## 2021-08-22 LAB — RESP PANEL BY RT-PCR (FLU A&B, COVID) ARPGX2
Influenza A by PCR: POSITIVE — AB
Influenza B by PCR: NEGATIVE
SARS Coronavirus 2 by RT PCR: NEGATIVE

## 2021-08-22 LAB — BRAIN NATRIURETIC PEPTIDE: B Natriuretic Peptide: 62.3 pg/mL (ref 0.0–100.0)

## 2021-08-22 LAB — TROPONIN I (HIGH SENSITIVITY)
Troponin I (High Sensitivity): 27 ng/L — ABNORMAL HIGH (ref <18)
Troponin I (High Sensitivity): 28 ng/L — ABNORMAL HIGH (ref <18)

## 2021-08-22 LAB — PROTIME-INR
INR: 1.1 (ref 0.8–1.2)
Prothrombin Time: 13.6 seconds (ref 11.4–15.2)

## 2021-08-22 MED ORDER — PREDNISONE 20 MG PO TABS
40.0000 mg | ORAL_TABLET | Freq: Every day | ORAL | Status: DC
  Administered 2021-08-22 – 2021-08-25 (×4): 40 mg via ORAL
  Filled 2021-08-22 (×4): qty 2

## 2021-08-22 MED ORDER — IPRATROPIUM-ALBUTEROL 0.5-2.5 (3) MG/3ML IN SOLN
3.0000 mL | Freq: Once | RESPIRATORY_TRACT | Status: AC
  Administered 2021-08-22: 3 mL via RESPIRATORY_TRACT
  Filled 2021-08-22: qty 3

## 2021-08-22 MED ORDER — DOLUTEGRAVIR SODIUM 50 MG PO TABS
50.0000 mg | ORAL_TABLET | Freq: Every day | ORAL | Status: DC
  Filled 2021-08-22: qty 1

## 2021-08-22 MED ORDER — DOLUTEGRAVIR SODIUM 50 MG PO TABS
50.0000 mg | ORAL_TABLET | Freq: Every day | ORAL | Status: DC
  Administered 2021-08-23 – 2021-08-25 (×3): 50 mg via ORAL
  Filled 2021-08-22 (×3): qty 1

## 2021-08-22 MED ORDER — SODIUM CHLORIDE 0.9 % IV SOLN: 1.0000 g | INTRAVENOUS | Status: DC

## 2021-08-22 MED ORDER — TRIAMCINOLONE 0.1 % CREAM:EUCERIN CREAM 1:1
1.0000 "application " | TOPICAL_CREAM | Freq: Three times a day (TID) | CUTANEOUS | Status: DC | PRN
  Filled 2021-08-22: qty 1

## 2021-08-22 MED ORDER — OSELTAMIVIR PHOSPHATE 75 MG PO CAPS
75.0000 mg | ORAL_CAPSULE | Freq: Two times a day (BID) | ORAL | Status: DC
Start: 2021-08-22 — End: 2021-08-25
  Administered 2021-08-22 – 2021-08-25 (×7): 75 mg via ORAL
  Filled 2021-08-22 (×8): qty 1

## 2021-08-22 MED ORDER — DARUN-COBIC-EMTRICIT-TENOFAF 800-150-200-10 MG PO TABS
1.0000 | ORAL_TABLET | Freq: Every day | ORAL | Status: DC
  Administered 2021-08-23 – 2021-08-25 (×3): 1 via ORAL
  Filled 2021-08-22 (×3): qty 1

## 2021-08-22 MED ORDER — HYDROCOD POLI-CHLORPHE POLI ER 10-8 MG/5ML PO SUER
5.0000 mL | Freq: Once | ORAL | Status: DC
  Filled 2021-08-22: qty 5

## 2021-08-22 MED ORDER — IOHEXOL 350 MG/ML SOLN
75.0000 mL | Freq: Once | INTRAVENOUS | Status: AC | PRN
  Administered 2021-08-22: 75 mL via INTRAVENOUS

## 2021-08-22 MED ORDER — IPRATROPIUM-ALBUTEROL 0.5-2.5 (3) MG/3ML IN SOLN
3.0000 mL | Freq: Four times a day (QID) | RESPIRATORY_TRACT | Status: DC
  Administered 2021-08-22 – 2021-08-25 (×12): 3 mL via RESPIRATORY_TRACT
  Filled 2021-08-22 (×12): qty 3

## 2021-08-22 MED ORDER — POTASSIUM CHLORIDE CRYS ER 20 MEQ PO TBCR
40.0000 meq | EXTENDED_RELEASE_TABLET | Freq: Once | ORAL | Status: AC
  Administered 2021-08-22: 40 meq via ORAL
  Filled 2021-08-22: qty 2

## 2021-08-22 MED ORDER — SULFAMETHOXAZOLE-TRIMETHOPRIM 800-160 MG PO TABS
1.0000 | ORAL_TABLET | Freq: Every day | ORAL | Status: DC
  Administered 2021-08-23 – 2021-08-25 (×3): 1 via ORAL
  Filled 2021-08-22 (×3): qty 1

## 2021-08-22 MED ORDER — SODIUM CHLORIDE 0.9 % IV SOLN
2.0000 g | Freq: Three times a day (TID) | INTRAVENOUS | Status: DC
  Administered 2021-08-22: 2 g via INTRAVENOUS
  Filled 2021-08-22: qty 12.5

## 2021-08-22 MED ORDER — UMECLIDINIUM-VILANTEROL 62.5-25 MCG/ACT IN AEPB
1.0000 | INHALATION_SPRAY | Freq: Every day | RESPIRATORY_TRACT | Status: DC
Start: 2021-08-22 — End: 2021-08-25
  Administered 2021-08-22 – 2021-08-25 (×4): 1 via RESPIRATORY_TRACT
  Filled 2021-08-22: qty 14

## 2021-08-22 MED ORDER — OSELTAMIVIR PHOSPHATE 75 MG PO CAPS
75.0000 mg | ORAL_CAPSULE | Freq: Once | ORAL | Status: AC
Start: 2021-08-22 — End: 2021-08-22
  Administered 2021-08-22: 75 mg via ORAL
  Filled 2021-08-22: qty 1

## 2021-08-22 MED ORDER — DM-GUAIFENESIN ER 30-600 MG PO TB12
1.0000 | ORAL_TABLET | Freq: Two times a day (BID) | ORAL | Status: DC
  Administered 2021-08-22 – 2021-08-25 (×6): 1 via ORAL
  Filled 2021-08-22 (×8): qty 1

## 2021-08-22 MED ORDER — SODIUM CHLORIDE 0.9 % IV SOLN
1.0000 g | INTRAVENOUS | Status: DC
  Administered 2021-08-22 – 2021-08-23 (×2): 1 g via INTRAVENOUS
  Filled 2021-08-22 (×2): qty 10

## 2021-08-22 MED ORDER — ENOXAPARIN SODIUM 40 MG/0.4ML IJ SOSY
40.0000 mg | PREFILLED_SYRINGE | INTRAMUSCULAR | Status: DC
  Administered 2021-08-22 – 2021-08-24 (×3): 40 mg via SUBCUTANEOUS
  Filled 2021-08-22 (×3): qty 0.4

## 2021-08-22 MED ORDER — BUDESONIDE 0.5 MG/2ML IN SUSP: 2.0000 mg | Freq: Two times a day (BID) | RESPIRATORY_TRACT | Status: DC

## 2021-08-22 MED ORDER — DIPHENHYDRAMINE HCL 50 MG/ML IJ SOLN
12.5000 mg | Freq: Once | INTRAMUSCULAR | Status: AC
  Administered 2021-08-22: 12.5 mg via INTRAVENOUS
  Filled 2021-08-22: qty 1

## 2021-08-22 MED ORDER — ALBUTEROL SULFATE HFA 108 (90 BASE) MCG/ACT IN AERS
2.0000 | INHALATION_SPRAY | RESPIRATORY_TRACT | Status: DC | PRN
  Administered 2021-08-22: 2 via RESPIRATORY_TRACT
  Filled 2021-08-22: qty 6.7

## 2021-08-22 NOTE — TOC Progression Note (Signed)
Transition of Care [facility]) - Progression Note    Patient Details  Name: [name] MRN: [id number] Date of Birth: [birth year]  Transition of Care [facility]) CM/SW Contact  Leone Haven, RN Phone Number: 08/22/2021, 4:48 PM  Clinical Narrative:     From home, acute hypoxic respiratory failure 2/2 to influenza A and likely COPD ex, has 042, speaks [name].  He has a follow up apt with [facility] on [facility].  Looks like he follows up with ID as well.  TOC will continue to follow for dc needs.          Expected Discharge Plan and Services                                                 Social Determinants of Health (SDOH) Interventions    Readmission Risk Interventions     No data to display

## 2021-08-22 NOTE — ED Provider Notes (Signed)
[facility] EMERGENCY DEPARTMENT Provider Note   CSN: [id number] Arrival date & time: 08/22/21  [id number]     History  Chief Complaint  Patient presents with   Shortness of Breath    [name] is a 51 y.o. male.  51yo male presents with complaint of not feeling well, SHOB, tired, fatigued, itching, nauseous/vomiting (non bloody), chills (no fever checked). Onset 1 week ago, intermittent, gradually getting worse. No history of similar symptoms previously.  Also pain in chest and both shoulders, CP onset 1 week ago, onset with cough (non productive), only has pain in the chest with coughing. Denies constipation or diarrhea. Taking all medications, on Bactrim. Prior PE, not on blood thinner (states finished).   Significant complex medical history including AIDS (CD4 106 06/2021), CHF (EF 45-50% 10/2020), CKD 3b, pneumocystis jirovecii pneumonia, TB, PE (not anticoagulated), COPD  A language interpreter was used (swahili).  Shortness of Breath      Home Medications Prior to Admission medications   Medication Sig Start Date End Date Taking? Authorizing Provider  Darunavir-Cobicistat-Emtricitabine-Tenofovir Alafenamide (SYMTUZA) 800-150-200-10 MG TABS TAKE 1 TABLET BY MOUTH 1 TIME A DAY WITH BREAKFAST Patient taking differently: Take 1 tablet by mouth daily with breakfast. 08/04/21  Yes Carlyle Basques, MD  dolutegravir (TIVICAY) 50 MG tablet TAKE ONE TABLET BY MOUTH ONCE DAILY. STORE AT ROOM TEMPERATURE Patient taking differently: Take 50 mg by mouth daily. 08/04/21  Yes Carlyle Basques, MD  guaiFENesin (ROBITUSSIN) 100 MG/5ML liquid Take 5 mLs by mouth every 4 (four) hours. 08/04/21  Yes Carlyle Basques, MD  ibuprofen (ADVIL) 200 MG tablet Take 200 mg by mouth every 6 (six) hours as needed for mild pain.   Yes [provider]  sulfamethoxazole-trimethoprim (BACTRIM DS) 800-160 MG tablet Take 1 tablet by mouth daily. 07/24/21 10/22/21 Yes Carlyle Basques, MD   albuterol (VENTOLIN HFA) 108 (90 Base) MCG/ACT inhaler Inhale 2 puffs into the lungs every 6 (six) hours as needed for wheezing or shortness of breath. Patient not taking: Reported on 08/04/2021 03/27/21   Prosperi, Christian H, PA-C  azithromycin (ZITHROMAX) 250 MG tablet Take 2 on the first day, followed by 1 daily until complete Patient not taking: Reported on 08/22/2021 08/04/21   Carlyle Basques, MD  hydrOXYzine (ATARAX) 25 MG tablet Take 1 tablet (25 mg total) by mouth every 6 (six) hours. Patient not taking: Reported on 08/04/2021 06/04/21   Blanchie Dessert, MD  predniSONE (DELTASONE) 20 MG tablet Take 2 tablets (40 mg total) by mouth daily. Take 2 tabs (40mg ) po for the next 7 days then take 1 tab (20mg ) for 3 days then 0.5 tab (10mg ) for 4 days Patient not taking: Reported on 08/22/2021 06/04/21   Blanchie Dessert, MD  Triamcinolone Acetonide (TRIAMCINOLONE 0.1 % CREAM : EUCERIN) CREA Apply 1 application topically 3 (three) times daily as needed. Patient not taking: Reported on 08/22/2021 05/05/21   [place] Callas, NP  triamcinolone ointment (KENALOG) 0.1 % Apply 1 application. topically 2 (two) times daily. Patient not taking: Reported on 08/22/2021 08/04/21   Carlyle Basques, MD      Allergies    Metformin and related    Review of Systems   Review of Systems  Respiratory:  Positive for shortness of breath.   Negative except as per HPI  Physical Exam Updated Vital Signs BP (!) 134/93   Pulse 98   Temp 99.1 F (37.3 C) (Oral)   Resp (!) 21   SpO2 96%  Physical Exam Vitals  and nursing note reviewed.  Constitutional:      General: He is not in acute distress.    Appearance: He is well-developed. He is ill-appearing. He is not diaphoretic.  HENT:     Head: Normocephalic and atraumatic.  Cardiovascular:     Rate and Rhythm: Regular rhythm. Tachycardia present.  Pulmonary:     Effort: Tachypnea present.     Breath sounds: Decreased breath sounds and rhonchi present. No wheezing.   Chest:     Chest wall: No tenderness.  Abdominal:     Palpations: Abdomen is soft.     Comments: colostomy  Musculoskeletal:     Cervical back: Neck supple.     Right lower leg: No tenderness. No edema.     Left lower leg: No tenderness. No edema.  Skin:    General: Skin is warm and dry.  Neurological:     Mental Status: He is alert and oriented to person, place, and time.  Psychiatric:        Behavior: Behavior normal.     ED Results / Procedures / Treatments   Labs (all labs ordered are listed, but only abnormal results are displayed) Labs Reviewed  RESP PANEL BY RT-PCR (FLU A&B, COVID) ARPGX2 - Abnormal; Notable for the following components:      Result Value   Influenza A by PCR POSITIVE (*)    All other components within normal limits  BASIC METABOLIC PANEL - Abnormal; Notable for the following components:   Potassium 3.3 (*)    Glucose, Bld 106 (*)    All other components within normal limits  CBC - Abnormal; Notable for the following components:   RBC 4.09 (*)    MCH 34.5 (*)    All other components within normal limits  TROPONIN I (HIGH SENSITIVITY) - Abnormal; Notable for the following components:   Troponin I (High Sensitivity) 27 (*)    All other components within normal limits  TROPONIN I (HIGH SENSITIVITY) - Abnormal; Notable for the following components:   Troponin I (High Sensitivity) 28 (*)    All other components within normal limits  CULTURE, BLOOD (ROUTINE X 2)  CULTURE, BLOOD (ROUTINE X 2)  URINE CULTURE  BRAIN NATRIURETIC PEPTIDE  PROTIME-INR  LACTIC ACID, PLASMA  HEPATIC FUNCTION PANEL    EKG None  Radiology CT Angio Chest PE W/Cm &/Or Wo Cm  Result Date: 08/22/2021 CLINICAL DATA:  Cough and shortness of breath. EXAM: CT ANGIOGRAPHY CHEST WITH CONTRAST TECHNIQUE: Multidetector CT imaging of the chest was performed using the standard protocol during bolus administration of intravenous contrast. Multiplanar CT image reconstructions and MIPs  were obtained to evaluate the vascular anatomy. RADIATION DOSE REDUCTION: This exam was performed according to the departmental dose-optimization program which includes automated exposure control, adjustment of the mA and/or kV according to patient size and/or use of iterative reconstruction technique. CONTRAST:  79mL OMNIPAQUE IOHEXOL 350 MG/ML SOLN COMPARISON:  CT of the chest without contrast on 04/22/2021, CTA of the chest on 11/01/2020 FINDINGS: Cardiovascular: Adequate opacification of pulmonary arteries. No evidence of pulmonary embolism. Stable dilated central pulmonary arteries. Stable heart size with chronic mild dilatation both right and left ventricles. No pericardial fluid. No visualized calcified coronary artery plaque. The thoracic aorta is normal in caliber. Stable normal variant separate origin the left vertebral artery off the aortic arch. Mediastinum/Nodes: Stable small scattered mediastinal lymph nodes and mildly prominent bilateral hilar lymph node tissue likely representing chronic reactive lymph nodes. Thyroid gland, trachea, and esophagus  demonstrate no significant findings. Stable small hiatal hernia. Lungs/Pleura: Stable severe chronic lung disease consisting of bullous disease, prominent bilateral pulmonary scarring and chronic bronchiectasis of the left lung with multiple areas of chronic mucous plugging and bronchial wall thickening. Dense pneumonia of the right lower lobe seen in February has resolved. No acute pulmonary consolidation, edema, pneumothorax or pleural fluid identified. Upper Abdomen: No acute abnormality. Musculoskeletal: No chest wall abnormality. No acute or significant osseous findings. Review of the MIP images confirms the above findings. IMPRESSION: 1. No evidence of pulmonary embolism. 2. Stable evidence of pulmonary hypertension with dilated central pulmonary arteries. 3. Stable probable reactive mediastinal and bilateral hilar lymph nodes due to severe chronic  lung disease. 4. Stable severe chronic lung disease with bullous disease, prominent bilateral pulmonary scarring and chronic bronchiectasis in the left lung with multiple areas of chronic mucous plugging and bronchial wall thickening. Acute pneumonia of the right lower lobe seen in February has resolved. No acute consolidation currently identified. Electronically Signed   By: Aletta Edouard M.D.   On: 08/22/2021 10:28   DG Chest Port 1 View  Result Date: 08/22/2021 CLINICAL DATA:  Chest pain and shortness of breath. EXAM: PORTABLE CHEST 1 VIEW COMPARISON:  06/04/2020 FINDINGS: Stable cardiomediastinal contours. The lungs are hyperinflated and there are advanced changes COPD/emphysema. There are extensive areas of bronchiectasis, architectural distortion and scarring within the left upper lobe and left lower lobe. Mild increase in opacities within the periphery of the left upper lobe which may reflect superimposed inflammation or infection. Right lung is clear. IMPRESSION: 1. Extensive bronchiectasis, scarring and architectural distortion in the left lung with mild superimposed opacities in the left upper lobe which may reflect new inflammation or infection. 2. Advanced COPD/emphysema.  Emphysema (ICD10-J43.9). Electronically Signed   By: Kerby Moors M.D.   On: 08/22/2021 07:56    Procedures .Critical Care  Performed by: Tacy Learn, PA-C Authorized by: Tacy Learn, PA-C   Critical care provider statement:    Critical care time (minutes):  30   Critical care was time spent personally by me on the following activities:  Development of treatment plan with patient or surrogate, discussions with consultants, evaluation of patient's response to treatment, examination of patient, ordering and review of laboratory studies, ordering and review of radiographic studies, ordering and performing treatments and interventions, pulse oximetry, re-evaluation of patient's condition and review of old charts      Medications Ordered in ED Medications  dextromethorphan-guaiFENesin (MUCINEX DM) 30-600 MG per 12 hr tablet 1 tablet (has no administration in time range)  Darunavir-Cobicistat-Emtricitabine-Tenofovir Alafenamide (SYMTUZA) 800-150-200-10 MG TABS 1 tablet (has no administration in time range)  dolutegravir (TIVICAY) tablet 50 mg (has no administration in time range)  sulfamethoxazole-trimethoprim (BACTRIM DS) 800-160 MG per tablet 1 tablet (has no administration in time range)  triamcinolone 0.1 % cream : eucerin cream, 1:1 1 application  (has no administration in time range)  enoxaparin (LOVENOX) injection 40 mg (has no administration in time range)  ceFEPIme (MAXIPIME) 2 g in sodium chloride 0.9 % 100 mL IVPB (has no administration in time range)  ipratropium-albuterol ([name]) 0.5-2.5 (3) MG/3ML nebulizer solution 3 mL (has no administration in time range)  umeclidinium-vilanterol (ANORO ELLIPTA) 62.5-25 MCG/ACT 1 puff (has no administration in time range)  budesonide (PULMICORT) nebulizer solution 2 mg (has no administration in time range)  diphenhydrAMINE (BENADRYL) injection 12.5 mg (12.5 mg Intravenous Given 08/22/21 0757)  oseltamivir (TAMIFLU) capsule 75 mg (75 mg Oral Given 08/22/21  0930)  iohexol (OMNIPAQUE) 350 MG/ML injection 75 mL (75 mLs Intravenous Contrast Given 08/22/21 1006)  potassium chloride SA (KLOR-CON M) CR tablet 40 mEq (40 mEq Oral Given 08/22/21 1139)  ipratropium-albuterol ([name]) 0.5-2.5 (3) MG/3ML nebulizer solution 3 mL (3 mLs Nebulization Given 08/22/21 1141)    ED Course/ Medical Decision Making/ A&P                           Medical Decision Making Amount and/or Complexity of Data Reviewed Labs: ordered. Radiology: ordered.  Risk Prescription drug management.   This patient presents to the ED for concern of SHOB, fatigue, cough, weakness, this involves an extensive number of treatment options, and is a complaint that carries with it a high risk of  complications and morbidity.  The differential diagnosis includes but not limited to pneumonia, PE, CHF, ACS, anemia, sepsis, bronchitis, dehydration, AKI   Co morbidities that complicate the patient evaluation  AIDS (CD4 106 06/2021), CHF (EF 45-50% 10/2020), CKD 3b, pneumocystis jirovecii pneumonia, TB, PE (not anticoagulated), COPD   Additional history obtained:  External records from outside source obtained and reviewed including recent visit to ID clinic on 08/04/21, on Bactrim, added Zithromax for cough x 4 weeks with concern for PNA Prior CXR from 06/04/21 Prior echo 10/2020 with EF 45-50%   Lab Tests:  I Ordered, and personally interpreted labs.  The pertinent results include:  CBC without significant findings, BMP with mild hypokalemia with K 3.3; lactic acid normal; INR normal; Resp panel positive for flu, negative for COVID.    Imaging Studies ordered:  I ordered imaging studies including CXR, CTA PE study  I independently visualized and interpreted imaging which showed CXR with left lung changes, appears unchanged from prior. CTA chest as read by radiology- chronic/stable exam, negative for PE I agree with the radiologist interpretation   Cardiac Monitoring: / EKG:  The patient was maintained on a cardiac monitor.  I personally viewed and interpreted the cardiac monitored which showed an underlying rhythm of: sinus tach rate 121   Consultations Obtained:  I requested consultation with the hospitalist,  and discussed lab and imaging findings as well as pertinent plan - they recommend: admission   Problem List / ED Course / Critical interventions / Medication management  51 yo male with complex history as above presents with worsening cough/SHOB, feeling weak and poor for the past week. On exam, he is ill appearing, course lung sounds throughout, requiring 4L [place] (not on supplemental at baseline). Concern for PE vs infectious source, IVF restricted due to CHF history. Given  duo neb without significant improvement, continues to have 4L O2 requirement. CXR unable to rule out infectious source. CTA negative for PE/infection, poor lung disease at baseline without acute changes.  Found to have influenza A, given tamiflu. Admitted for further work up.  I ordered medication including duo neb, tamiflu  for hypoxia, influenza   Reevaluation of the patient after these medicines showed that the patient stayed the same I have reviewed the patients home medicines and have made adjustments as needed   Social Determinants of Health:  Speaks swahili, interpreter used for all interactions, followed by local ID clinic     Test / Admission - Considered:  Admitted for further evaluation and monitoring.          Final Clinical Impression(s) / ED Diagnoses Final diagnoses:  Influenza A  Hypoxia  Hypokalemia  Chronic congestive heart failure, unspecified heart  failure type [facility])  Pulmonary emphysema, unspecified emphysema type ([place])  AIDS [facility])    Rx / DC Orders ED Discharge Orders     None         Roque Lias 08/22/21 1252    Isla Pence, MD 08/25/21 [phone]

## 2021-08-22 NOTE — ED Triage Notes (Signed)
Pt reported to ED with c/o cough and shortness of breath x1 week. Pt O2 saturations 88% on room air and increased to 95% on 4L via [place].

## 2021-08-22 NOTE — H&P (Signed)
NAME:  [name], MRN:  [id number], DOB:  12/19/1970, LOS: 0 ADMISSION DATE:  08/22/2021, Primary: Pcp, No  CHIEF COMPLAINT:  shortness of breath   Medical Service: Internal Medicine Teaching Service         Attending Physician: Dr. Reymundo PollGuilloud, Carolyn, MD    First Contact: Dr. Ruben ImAriwodo Pager: [phone]  Second Contact: Dr. Cyndie ChimeNguyen Pager: [phone]       After Hours (After 5p/  First Contact Pager: [phone]  weekends / holidays): Second Contact Pager: [phone]    History of present illness   [name] is a 51 year old chronically ill male with severe emphysematous COPD with bronchiectasis, AIDS, low health literacy, and remote history of PJP and pulmonary TB who presented to the ED for 1w history of fatigue, cough and worsening shortness of breath. A remote [name] interpreter was used for the entirety of our conversation.  Cough is unproductive but he develops experiences occassional post-tussive emesis. He has developed chest wall and back pain which he relates to his coughing. Denies fevers, chills, abdominal pain, diarrhea, constipation, myalgias, headache, sore throat, vision changes, photophobia.  Of note, he was admitted to the hospital in February for CAP at which time he underwent bronchoscopy. PJP was negative at that time.   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   Home Medications     Prior to Admission medications   Medication Sig Start Date End Date Taking? Authorizing  Provider  Darunavir-Cobicistat-Emtricitabine-Tenofovir Alafenamide (SYMTUZA) 800-150-200-10 MG TABS TAKE 1 TABLET BY MOUTH 1 TIME A DAY WITH BREAKFAST Patient taking differently: Take 1 tablet by mouth daily with breakfast. 08/04/21  Yes Judyann MunsonSnider, Cynthia, MD  dolutegravir (TIVICAY) 50 MG tablet TAKE ONE TABLET BY MOUTH ONCE DAILY. STORE AT ROOM TEMPERATURE Patient taking differently: Take 50 mg by mouth daily. 08/04/21  Yes Judyann MunsonSnider, Cynthia, MD  guaiFENesin (ROBITUSSIN) 100 MG/5ML liquid Take 5 mLs by mouth every 4 (four) hours. 08/04/21  Yes Judyann MunsonSnider, Cynthia, MD  ibuprofen (ADVIL) 200 MG tablet Take 200 mg by mouth every 6 (six) hours as needed for mild pain.   Yes [provider]  sulfamethoxazole-trimethoprim (BACTRIM DS) 800-160 MG tablet Take 1 tablet by mouth daily. 07/24/21 10/22/21 Yes Judyann MunsonSnider, Cynthia, MD  albuterol (VENTOLIN HFA) 108 (90 Base) MCG/ACT inhaler Inhale 2 puffs into the lungs every 6 (six) hours as needed for wheezing or shortness of breath. Patient not taking: Reported on 08/04/2021 03/27/21   Prosperi, Peni Rupard H, PA-C  azithromycin (ZITHROMAX) 250 MG tablet Take 2 on the first day, followed by 1 daily until complete Patient not taking: Reported on 08/22/2021 08/04/21   Judyann MunsonSnider, Cynthia, MD  hydrOXYzine (ATARAX) 25 MG tablet Take 1 tablet (25 mg total) by mouth every 6 (six) hours. Patient not taking: Reported on 08/04/2021 06/04/21   Gwyneth SproutPlunkett, Whitney, MD  predniSONE (DELTASONE) 20 MG tablet Take 2 tablets (40 mg total) by mouth daily. Take 2 tabs (40mg ) po for the next 7 days then take 1 tab (20mg ) for 3 days then 0.5 tab (10mg ) for 4 days Patient not taking: Reported on 08/22/2021 06/04/21   Gwyneth SproutPlunkett, Whitney, MD  Triamcinolone Acetonide (TRIAMCINOLONE 0.1 % CREAM : EUCERIN) CREA Apply 1 application topically 3 (three) times daily as needed. Patient not taking: Reported on 08/22/2021 05/05/21   Blanchard Kelch, NP  triamcinolone ointment (KENALOG) 0.1 % Apply 1 application.  topically 2 (two) times daily. Patient not taking: Reported on 08/22/2021 08/04/21   Judyann Munson, MD    Allergies    Allergies as of 08/22/2021 - Review Complete 08/22/2021  Allergen Reaction Noted   Metformin and related Swelling and Rash 02/27/2021    Social History   reports that he quit smoking about 20 years ago. His smoking use included cigarettes. He has a 3.00 pack-year smoking history. He has never used smokeless tobacco. He reports that he does not currently use alcohol. He reports that he does not use drugs.   Family History   His family history includes Heart disease in his sister; Hypertension in an other family member.   Objective   Blood pressure (!) 134/93, pulse 98, temperature 99.1 F (37.3 C), temperature source Oral, resp. rate (!) 21, SpO2 96 %.    General: chronically ill appearing male resting comfortably in bed HEENT: bilateral conjunctivitis, MMM Cardiac: RRR, trace non-pitting LE edema Pulm: on 4L [place]. No distress at rest. Can speak in 3-4 word sentences. Diffuse rales and rhonchi throughout the bilateral lung fields. Dry cough GI: abd soft, non-tender Skin: no rash or lesion, warm and dry Neuro: a/o x4. Moves all extremities equally Msk: no nuchal rigidity Significant Diagnostic Tests:       Latest Ref Rng & Units 08/22/2021    5:47 AM 06/04/2021   10:49 AM 04/24/2021    3:17 AM  CBC  WBC 4.0 - 10.5 K/uL 7.3  4.5  6.4   Hemoglobin 13.0 - 17.0 g/dL 62.1  30.8  65.7   Hematocrit 39.0 - 52.0 % 40.6  44.4  42.9   Platelets 150 - 400 K/uL 175  207  517       Latest Ref Rng & Units 08/22/2021    5:47 AM 06/04/2021   10:49 AM 04/24/2021    3:17 AM  BMP  Glucose 70 - 99 mg/dL 846  86  962   BUN 6 - 20 mg/dL 16  17  33   Creatinine 0.61 - 1.24 mg/dL 9.52  8.41  3.24   Sodium 135 - 145 mmol/L 135  143  133   Potassium 3.5 - 5.1 mmol/L 3.3  3.8  4.4   Chloride 98 - 111 mmol/L 101  107  93   CO2 22 - 32 mmol/L 25  29  28    Calcium 8.9 - 10.3 mg/dL 8.9  9.2      CXR: extensive bronchiectasis with advanced COPD changes  CTA chest: no PE. Stable pulmonary hypertension. Stable reactive mediastinal and bilateral hilar lymph nodes. Stable chronic bullous disease and evidence of chronic mucous plugging.  Summary  25 yom with AIDS and advanced COPD presenting with 1w history of cough, shortness of breath, and general malaise who was found to have acute hypoxic respiratory failure and tested positive for influenza A.  Assessment & Plan:   Acute hypoxic respiratory failure COPD exacerbation -hx of severe bronchiectasis Influenza A -PCR confirmed AIDS w/ remote history of PJP and pulmonary TB -CD4 count 106, viral load <20 on labs from April -follows at [facility] -reports medication compliance  Plan --admit to medical tele for close monitoring of respiratory status and oxygen titration --scheduled duonebs, LAMA-LABA --oxygen saturation goal  88-92% --antibiotic coverage with cefepime given immunocompromised state --encourage IS hourly while awake --continue symtuza and tivicay --continue bactrim for PJP prophylaxis --check CD4 and viral load  --wean oxygen as able --follow blood cultures  Eczema--continue steroid cream  Elevated troponin--stable 27>>28--likely demand. No significant ischemic changes on EKG  Diet controlled DM. Monitor intermittently.  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  Best practice:  CODE STATUS: FULL DVT for prophylaxis: lovenox Dispo: Admit patient to Inpatient with expected length of stay greater than 2 midnights.   Elige Radon, MD Internal Medicine Resident PGY-3 [facility] Pager: [phone] 08/22/2021 12:32 PM

## 2021-08-22 NOTE — ED Notes (Signed)
Patient transported to CT 

## 2021-08-23 DIAGNOSIS — J441 Chronic obstructive pulmonary disease with (acute) exacerbation: Secondary | ICD-10-CM

## 2021-08-23 DIAGNOSIS — J9601 Acute respiratory failure with hypoxia: Secondary | ICD-10-CM | POA: Diagnosis not present

## 2021-08-23 DIAGNOSIS — J439 Emphysema, unspecified: Secondary | ICD-10-CM

## 2021-08-23 LAB — BASIC METABOLIC PANEL
Anion gap: 10 (ref 5–15)
BUN: 15 mg/dL (ref 6–20)
CO2: 26 mmol/L (ref 22–32)
Calcium: 9.3 mg/dL (ref 8.9–10.3)
Chloride: 101 mmol/L (ref 98–111)
Creatinine, Ser: 1.26 mg/dL — ABNORMAL HIGH (ref 0.61–1.24)
GFR, Estimated: >60 mL/min (ref >60)
Glucose, Bld: 108 mg/dL — ABNORMAL HIGH (ref 70–99)
Potassium: 3.8 mmol/L (ref 3.5–5.1)
Sodium: 137 mmol/L (ref 135–145)

## 2021-08-23 LAB — HIV-1 RNA QUANT-NO REFLEX-BLD
HIV 1 RNA Quant: 40 copies/mL
LOG10 HIV-1 RNA: 1.602 log10copy/mL

## 2021-08-23 NOTE — Progress Notes (Signed)
SATURATION QUALIFICATIONS: (This note is used to comply with regulatory documentation for home oxygen)  Patient Saturations on Room Air at Rest = 88%  Patient Saturations on Room Air while Ambulating = 79%  Patient Saturations on 6 Liters of oxygen while Ambulating = 91%

## 2021-08-23 NOTE — Progress Notes (Signed)
HD#1 Subjective:  Overnight Events: No events  No [name] interpreter available.  Patient does not appear in respiratory distress.  Endorses coughing.  Objective:  Vital signs in last 24 hours: Vitals:   08/22/21 2031 08/23/21 0002 08/23/21 0445 08/23/21 0751  BP: 124/73 117/74 114/75   Pulse: (!) 115 (!) 105 97   Resp: 18  20   Temp: 99.2 F (37.3 C) 98.6 F (37 C) 99.2 F (37.3 C)   TempSrc: Oral Oral Oral   SpO2: 90%  93% 94%  Weight:   77.1 kg   Height:       Supplemental O2: Nasal Cannula SpO2: 94 % O2 Flow Rate (L/min): 3.5 L/min FiO2 (%): 35 %   Physical Exam:  Physical Exam Constitutional:      General: He is not in acute distress. HENT:     Head: Normocephalic.  Eyes:     General:        Right eye: No discharge.        Left eye: No discharge.     Conjunctiva/sclera: Conjunctivae normal.  Cardiovascular:     Rate and Rhythm: Normal rate and regular rhythm.     Comments: No LE edema Pulmonary:     Effort: Pulmonary effort is normal. No respiratory distress.     Comments: No wheezing but harsh breath sound heard.  Likely from the upper airway. Neurological:     Mental Status: He is alert.     Filed Weights   08/22/21 1512 08/23/21 0445  Weight: 77.2 kg 77.1 kg     Intake/Output Summary (Last 24 hours) at 08/23/2021 1023 Last data filed at 08/23/2021 0800 Gross per 24 hour  Intake 188.42 ml  Output 875 ml  Net -686.58 ml   Net IO Since Admission: -686.58 mL [08/23/21 1023]  Pertinent Labs:    Latest Ref Rng & Units 08/22/2021    5:47 AM 06/04/2021   10:49 AM 04/24/2021    3:17 AM  CBC  WBC 4.0 - 10.5 K/uL 7.3  4.5  6.4   Hemoglobin 13.0 - 17.0 g/dL 63.8  75.6  43.3   Hematocrit 39.0 - 52.0 % 40.6  44.4  42.9   Platelets 150 - 400 K/uL 175  207  517        Latest Ref Rng & Units 08/23/2021    7:47 AM 08/22/2021   10:29 AM 08/22/2021    5:47 AM  CMP  Glucose 70 - 99 mg/dL 295   188   BUN 6 - 20 mg/dL 15   16   Creatinine 4.16 - 1.24  mg/dL 6.06   3.01   Sodium 601 - 145 mmol/L 137   135   Potassium 3.5 - 5.1 mmol/L 3.8   3.3   Chloride 98 - 111 mmol/L 101   101   CO2 22 - 32 mmol/L 26   25   Calcium 8.9 - 10.3 mg/dL 9.3   8.9   Total Protein 6.5 - 8.1 g/dL  7.8    Total Bilirubin 0.3 - 1.2 mg/dL  0.8    Alkaline Phos 38 - 126 U/L  65    AST 15 - 41 U/L  24    ALT 0 - 44 U/L  15      Imaging: No results found.  Assessment/Plan:   Principal Problem:   Acute respiratory failure with hypoxia ([name]) Active Problems:   AIDS (acquired immune deficiency syndrome) ([name])   COPD exacerbation ([name])  Patient Summary: [name] is a 57 yom with AIDS and advanced COPD presenting with 1w history of cough, shortness of breath, and general malaise who was found to have acute hypoxic respiratory failure and tested positive for influenza A.  Acute hypoxic respiratory failure COPD exacerbation Influenza A infection Continue prednisone, Tamiflu and DuoNebs PRN.  Ceftriaxone was started due to his immunocompromised state to cover for bacterial pneumonia.  Patient was satting in the low 90s on 3.5 L (on room air at home).  We will continue current regimen today and try to wean down his oxygen as tolerable.  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 TOC recs:   Dispo: Anticipated discharge to Home in 1 -2 days pending improvement of oxygen requirement.  Doran Stabler, DO 08/23/2021, 10:23 AM Pager: [phone]  Please contact the on call pager after 5 pm and on weekends at [phone].

## 2021-08-23 NOTE — Evaluation (Signed)
Occupational Therapy Evaluation Patient Details Name: [name] MRN: [id number] DOB: 11-05-1970 Today's Date: 08/23/2021   History of Present Illness Pt is a 51 y.o. male admitted 08/22/21 with acute hypoxic respiratory failure secondary to influenza A, COPD exacerbation. PMH includes AIDS/HIV, advanced COPD, chronic bronchiectasis, PE not on anticoagulation, DM, TB.   Clinical Impression   Patient admitted for the diagnosis above.  PTA he lives alone in a level entry apartment, and has assist if needed from family.  Patient states he is driving locally.  Currently he appears to be at his baseline for in room mobility and self care.  Patient setup for ADL sit/stand sinkside, and needed no assist.  Patient able to walk to the bathroom w/o AD or physical assist.  Patient able to care for his colostomy bag, and is sitting up in the recliner.  No OT needs identified, and no advised PT they can screen him if needed.  No post acute rehab needs.       Recommendations for follow up therapy are one component of a multi-disciplinary discharge planning process, led by the attending physician.  Recommendations may be updated based on patient status, additional functional criteria and insurance authorization.   Follow Up Recommendations  No OT follow up    Assistance Recommended at Discharge PRN  Patient can return home with the following      Functional Status Assessment  Patient has not had a recent decline in their functional status  Equipment Recommendations  None recommended by OT    Recommendations for Other Services       Precautions / Restrictions Precautions Precautions: None Restrictions Weight Bearing Restrictions: No      Mobility Bed Mobility Overal bed mobility: Independent               Patient Response: Cooperative  Transfers Overall transfer level: Independent Equipment used: None                      Balance Overall balance assessment: No  apparent balance deficits (not formally assessed)                                         ADL either performed or assessed with clinical judgement   ADL Overall ADL's : At baseline                                             Vision Patient Visual Report: No change from baseline       Perception Perception Perception: Not tested   Praxis Praxis Praxis: Not tested    Pertinent Vitals/Pain Pain Assessment Pain Assessment: No/denies pain     Hand Dominance Right   Extremity/Trunk Assessment Upper Extremity Assessment Upper Extremity Assessment: Overall WFL for tasks assessed   Lower Extremity Assessment Lower Extremity Assessment: Overall WFL for tasks assessed   Cervical / Trunk Assessment Cervical / Trunk Assessment: Normal   Communication Communication Communication: Prefers language other than English   Cognition Arousal/Alertness: Awake/alert Behavior During Therapy: WFL for tasks assessed/performed Overall Cognitive Status: Within Functional Limits for tasks assessed  General Comments   HR to 114 with mobility    Exercises     Shoulder Instructions      Home Living Family/patient expects to be discharged to:: Private residence Living Arrangements: Alone Available Help at Discharge: Family;Available PRN/intermittently Type of Home: Apartment Home Access: Level entry     Home Layout: Bed/bath upstairs;Two level   Alternate Level Stairs-Rails: Left Bathroom Shower/Tub: Teacher, early years/pre: Standard Bathroom Accessibility: No              Prior Functioning/Environment Prior Level of Function : Independent/Modified Independent;Driving                        OT Problem List: Decreased activity tolerance      OT Treatment/Interventions:      OT Goals(Current goals can be found in the care plan section) Acute Rehab OT  Goals Patient Stated Goal: Go home OT Goal Formulation: With patient Time For Goal Achievement: 08/29/21 Potential to Achieve Goals: Good  OT Frequency:      Co-evaluation              AM-PAC OT "6 Clicks" Daily Activity     Outcome Measure Help from another person eating meals?: None Help from another person taking care of personal grooming?: None Help from another person toileting, which includes using toliet, bedpan, or urinal?: None Help from another person bathing (including washing, rinsing, drying)?: None Help from another person to put on and taking off regular upper body clothing?: None Help from another person to put on and taking off regular lower body clothing?: None 6 Click Score: 24   End of Session Equipment Utilized During Treatment: Oxygen Nurse Communication: Mobility status  Activity Tolerance: Patient tolerated treatment well Patient left: in chair;with call bell/phone within reach  OT Visit Diagnosis: Unsteadiness on feet (R26.81)                Time: [id number] OT Time Calculation (min): 22 min Charges:  OT General Charges $OT Visit: 1 Visit OT Evaluation $OT Eval Moderate Complexity: 1 Mod  08/23/2021  RP, OTR/L  Acute Rehabilitation Services  Office:  [phone]   Metta Clines 08/23/2021, 10:17 AM

## 2021-08-23 NOTE — Progress Notes (Signed)
PT Cancellation Note  Patient Details Name: ZYION DOXTATER MRN: [id number] DOB: 23-Nov-1970   Cancelled Treatment:    Reason Eval/Treat Not Completed: PT screened, no needs identified, will sign off. See Occupational Therapy Evaluation note for more details.  Ina Homes, PT, DPT Acute Rehabilitation Services  Pager [phone] Office [phone]  Malachy Chamber 08/23/2021, 10:14 AM

## 2021-08-23 NOTE — Consult Note (Signed)
WOC Nurse ostomy consult note Stoma type/location: LLQ Colostomy (End) created on 12/05/2018 at [facility] by Dr. Kathleen Argue for anal condyloma and Genital Warts. Provider (Dr. Byrd Hesselbach) later identified a parastomal hernia. Additionally, ihn subsequent follow up encounters, Dr. Byrd Hesselbach indicated that stoma is permanent.   WOC Consulted for provision of supply information/ordering numbers for Nursing personnel.   One (1) piece flexible flat pouching system is indicated with skin barrier ring.  Pouch is [employer] # [id number] Skin barrier ring is [employer] # [id number] It is recommended to order 5 of each to the bedside and replace as needed.  Ostomy care orders: LLQ end colostomy: Empty pouch when 1/3  to  full of stool/flatus Clean bottom 2-inches of fecal pouch prior to resealing Assist patient in emptying pouch Change pouch twice weekly and PRN.  Write date of pouch application on pouch. Have spare pouch at bedside at all times  WOC nursing team will not follow, but will remain available to this patient, the nursing and medical teams.  Please re-consult if needed.  Thank you for inviting [place] to participate in this patient's Plan of Care.  Ladona Mow, MSN, RN, CNS, GNP, Leda Min, [employer], [employer] phone:  [phone]

## 2021-08-23 NOTE — Progress Notes (Signed)
Pt showed me his ostomy. Ref [id number], size 3/4-2 1/2 in or 19-64 mm. Tried to get back to  [employer].

## 2021-08-23 NOTE — Progress Notes (Signed)
Mobility Specialist Progress Note    08/23/21 1633  Mobility  Activity Ambulated independently in room  Level of Assistance Independent after set-up  Assistive Device None  Distance Ambulated (ft) 150 ft  Activity Response Tolerated fair  $Mobility charge 1 Mobility   Pt received in bed and agreeable. Desatted into low 80s on RA. Had a few coughing fits and needed 6LO2 to recover to SpO2 >/=88%. RN notified.   [name] Mobility Specialist

## 2021-08-24 DIAGNOSIS — J9601 Acute respiratory failure with hypoxia: Secondary | ICD-10-CM | POA: Diagnosis not present

## 2021-08-24 LAB — BASIC METABOLIC PANEL
Anion gap: 8 (ref 5–15)
BUN: 19 mg/dL (ref 6–20)
CO2: 28 mmol/L (ref 22–32)
Calcium: 9.2 mg/dL (ref 8.9–10.3)
Chloride: 101 mmol/L (ref 98–111)
Creatinine, Ser: 1.32 mg/dL — ABNORMAL HIGH (ref 0.61–1.24)
GFR, Estimated: >60 mL/min (ref >60)
Glucose, Bld: 122 mg/dL — ABNORMAL HIGH (ref 70–99)
Potassium: 4 mmol/L (ref 3.5–5.1)
Sodium: 137 mmol/L (ref 135–145)

## 2021-08-24 LAB — CBC
HCT: 40.3 % (ref 39.0–52.0)
Hemoglobin: 13.4 g/dL (ref 13.0–17.0)
MCH: 33.2 pg (ref 26.0–34.0)
MCHC: 33.3 g/dL (ref 30.0–36.0)
MCV: 99.8 fL (ref 80.0–100.0)
Platelets: 146 10*3/uL — ABNORMAL LOW (ref 150–400)
RBC: 4.04 MIL/uL — ABNORMAL LOW (ref 4.22–5.81)
RDW: 12.5 % (ref 11.5–15.5)
WBC: 3.5 10*3/uL — ABNORMAL LOW (ref 4.0–10.5)
nRBC: 0 % (ref 0.0–0.2)

## 2021-08-24 NOTE — Progress Notes (Addendum)
HD#2 Subjective:  Overnight Events: NA  Patient seen and assessed at bedside with swahili interpreter. Patient reports feeling good today and not dyspneic today. Denies dyspnea with ambulation yesterday.  He denies other issue.  Objective:  Vital signs in last 24 hours: Vitals:   08/23/21 1500 08/23/21 1942 08/24/21 0023 08/24/21 0415  BP: 117/81 126/78 125/81 130/81  Pulse:  88 92 87  Resp:  20 20 20   Temp: 97.8 F (36.6 C) 97.9 F (36.6 C) 97.7 F (36.5 C) 98.6 F (37 C)  TempSrc: Oral Oral Oral Oral  SpO2:  96% 95% 98%  Weight:    75.6 kg  Height:       Supplemental O2: Nasal Cannula SpO2: 98 % O2 Flow Rate (L/min): 4 L/min FiO2 (%): 36 %   Physical Exam:  Physical Exam Constitutional:      General: He is not in acute distress. HENT:     Head: Normocephalic.  Eyes:     General:        Right eye: No discharge.        Left eye: No discharge.     Conjunctiva/sclera: Conjunctivae normal.  Cardiovascular:     Rate and Rhythm: Normal rate and regular rhythm.  Pulmonary:     Effort: Pulmonary effort is normal. No respiratory distress.     Breath sounds: Normal breath sounds. No wheezing.  Musculoskeletal:        General: Normal range of motion.  Skin:    General: Skin is warm.  Neurological:     Mental Status: He is alert and oriented to person, place, and time.  Psychiatric:        Mood and Affect: Mood normal.    Filed Weights   08/22/21 1512 08/23/21 0445 08/24/21 0415  Weight: 77.2 kg 77.1 kg 75.6 kg     Intake/Output Summary (Last 24 hours) at 08/24/2021 0609 Last data filed at 08/24/2021 0024 Gross per 24 hour  Intake 540 ml  Output 1525 ml  Net -985 ml   Net IO Since Admission: -1,521.58 mL [08/24/21 0609]  Pertinent Labs:    Latest Ref Rng & Units 08/24/2021    3:45 AM 08/22/2021    5:47 AM 06/04/2021   10:49 AM  CBC  WBC 4.0 - 10.5 K/uL 3.5  7.3  4.5   Hemoglobin 13.0 - 17.0 g/dL 06/06/2021  25.3  66.4   Hematocrit 39.0 - 52.0 % 40.3  40.6   44.4   Platelets 150 - 400 K/uL 146  175  207        Latest Ref Rng & Units 08/24/2021    3:45 AM 08/23/2021    7:47 AM 08/22/2021   10:29 AM  CMP  Glucose 70 - 99 mg/dL 10/22/2021  474    BUN 6 - 20 mg/dL 19  15    Creatinine 259 - 1.24 mg/dL 5.63  8.75    Sodium 6.43 - 145 mmol/L 137  137    Potassium 3.5 - 5.1 mmol/L 4.0  3.8    Chloride 98 - 111 mmol/L 101  101    CO2 22 - 32 mmol/L 28  26    Calcium 8.9 - 10.3 mg/dL 9.2  9.3    Total Protein 6.5 - 8.1 g/dL   7.8   Total Bilirubin 0.3 - 1.2 mg/dL   0.8   Alkaline Phos 38 - 126 U/L   65   AST 15 - 41 U/L   24  ALT 0 - 44 U/L   15     Imaging: No results found.  Assessment/Plan:   Principal Problem:   Acute respiratory failure with hypoxia ([name]) Active Problems:   AIDS (acquired immune deficiency syndrome) ([name])   COPD exacerbation ([name])   Patient Summary: [name] is a 21 yom with AIDS and advanced COPD presenting with 1w history of cough, shortness of breath, and general malaise who was found to have acute hypoxic respiratory failure from COPD exacerbation secondary to influenza A.  Acute hypoxic respiratory failure COPD exacerbation Influenza A infection Overall, he appears well clinically.  No signs of respiratory distress.  Ambulatory pulse ox yesterday showed that he desats to the low 80s and required 6 L to recover.  He is satting in the low 90s when turned off oxygen. -Reattempt ambulatory pulse ox today -Continue prednisone, Tamiflu and DuoNebs PRN. -Continue ceftriaxone (day 3).  If continues to improve, will stop antibiotics after 3 days   AIDS with history of PJP and pulmonary TB Viral load now is detected at 40.  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 TOC  recs:    Dispo: Anticipated discharge to Home in 1 -2 days pending improvement of oxygen requirement.  Doran Stabler, DO 08/24/2021, 6:09 AM Pager: [phone]  Please contact the on call pager after 5 pm and on weekends at [phone].

## 2021-08-24 NOTE — Progress Notes (Signed)
Patient ambulated in halls with nurse and walked the entire length 2 times. Patient denied any shortness of breath during ambulation.  Room Air prior to ambulation lying in bed: 89% Room Air prior to ambulation sitting up: 92% Room Air During Ambulation: lowest was 81% after one length of hallway 1L  [place] with ambulation :88% 1L [place] at rest after ambulation: 93% Recovered well at rest to 90% on Room Air within a couple of minutes

## 2021-08-25 ENCOUNTER — Telehealth: Payer: Self-pay

## 2021-08-25 ENCOUNTER — Other Ambulatory Visit (HOSPITAL_COMMUNITY): Payer: Self-pay

## 2021-08-25 DIAGNOSIS — J9601 Acute respiratory failure with hypoxia: Secondary | ICD-10-CM | POA: Diagnosis not present

## 2021-08-25 LAB — BASIC METABOLIC PANEL
Anion gap: 7 (ref 5–15)
BUN: 31 mg/dL — ABNORMAL HIGH (ref 6–20)
CO2: 24 mmol/L (ref 22–32)
Calcium: 8.9 mg/dL (ref 8.9–10.3)
Chloride: 102 mmol/L (ref 98–111)
Creatinine, Ser: 1.44 mg/dL — ABNORMAL HIGH (ref 0.61–1.24)
GFR, Estimated: 59 mL/min — ABNORMAL LOW (ref >60)
Glucose, Bld: 144 mg/dL — ABNORMAL HIGH (ref 70–99)
Potassium: 4.1 mmol/L (ref 3.5–5.1)
Sodium: 133 mmol/L — ABNORMAL LOW (ref 135–145)

## 2021-08-25 LAB — CBC
HCT: 40 % (ref 39.0–52.0)
Hemoglobin: 13.5 g/dL (ref 13.0–17.0)
MCH: 33.8 pg (ref 26.0–34.0)
MCHC: 33.8 g/dL (ref 30.0–36.0)
MCV: 100.3 fL — ABNORMAL HIGH (ref 80.0–100.0)
Platelets: 151 10*3/uL (ref 150–400)
RBC: 3.99 MIL/uL — ABNORMAL LOW (ref 4.22–5.81)
RDW: 12.7 % (ref 11.5–15.5)
WBC: 4.5 10*3/uL (ref 4.0–10.5)
nRBC: 0 % (ref 0.0–0.2)

## 2021-08-25 MED ORDER — ALBUTEROL SULFATE (2.5 MG/3ML) 0.083% IN NEBU: 2.5000 mg | INHALATION_SOLUTION | RESPIRATORY_TRACT | Status: DC | PRN

## 2021-08-25 MED ORDER — BENZONATATE 100 MG PO CAPS
100.0000 mg | ORAL_CAPSULE | Freq: Three times a day (TID) | ORAL | Status: DC | PRN
  Administered 2021-08-25 (×2): 100 mg via ORAL
  Filled 2021-08-25 (×2): qty 1

## 2021-08-25 MED ORDER — IPRATROPIUM-ALBUTEROL 0.5-2.5 (3) MG/3ML IN SOLN: 3.0000 mL | Freq: Two times a day (BID) | RESPIRATORY_TRACT | Status: DC

## 2021-08-25 MED ORDER — PREDNISONE 20 MG PO TABS
40.0000 mg | ORAL_TABLET | Freq: Every day | ORAL | 0 refills | Status: DC
Start: 2021-08-26 — End: 2021-08-30
  Filled 2021-08-25: qty 2, 1d supply, fill #0

## 2021-08-25 MED ORDER — OSELTAMIVIR PHOSPHATE 75 MG PO CAPS
75.0000 mg | ORAL_CAPSULE | Freq: Two times a day (BID) | ORAL | 0 refills | Status: DC
  Filled 2021-08-25: qty 3, 2d supply, fill #0

## 2021-08-25 MED ORDER — SULFAMETHOXAZOLE-TRIMETHOPRIM 800-160 MG PO TABS
1.0000 | ORAL_TABLET | Freq: Every day | ORAL | 0 refills | Status: DC
  Filled 2021-08-25: qty 30, 30d supply, fill #0

## 2021-08-25 NOTE — Progress Notes (Signed)
SATURATION QUALIFICATIONS: (This note is used to comply with regulatory documentation for home oxygen)  Patient Saturations on Room Air at Rest = 86%  Patient Saturations on Room Air while Ambulating = 82%  Patient Saturations on 2 Liters of oxygen while Ambulating = 90%  Please briefly explain why patient needs home oxygen:

## 2021-08-25 NOTE — Telephone Encounter (Signed)
-----   Message from Judyann Munson, MD sent at 08/25/2021 11:07 AM EDT ----- Can stephanie or greg see him in 2-3 wk?

## 2021-08-25 NOTE — Telephone Encounter (Signed)
Voice mail has not been set up, needs 2-3 week follow up with [facility] or [facility] per Dr. Drue Second

## 2021-08-25 NOTE — TOC Transition Note (Signed)
Transition of Care [facility]) - CM/SW Discharge Note   Patient Details  Name: [name] MRN: [id number] Date of Birth: 1970/08/31  Transition of Care [facility]) CM/SW Contact:  Leone Haven, RN Phone Number: 08/25/2021, 11:03 AM   Clinical Narrative:    Patient is for dc today, NCM spoke with patient using video interpreter, he states a friend will be transporting him home today and he is ok with [employer] supplying him the home oxygen.  He has no money, NCM informed him that the [facility] pharmacy will probably bill him for the medications. NCM made referral to [facility] with [employer] for the home oxygen, this will be brought up to his room.    Final next level of care: Home/Self Care Barriers to Discharge: No Barriers Identified   Patient Goals and CMS Choice Patient states their goals for this hospitalization and ongoing recovery are:: reutn home   Choice offered to / list presented to : NA  Discharge Placement                       Discharge Plan and Services                DME Arranged: Oxygen DME Agency: [employer] Date DME Agency Contacted: 08/25/21 Time DME Agency Contacted: 1103 Representative spoke with at DME Agency: Arnold Long HH Arranged: NA          Social Determinants of Health (SDOH) Interventions     Readmission Risk Interventions     No data to display

## 2021-08-25 NOTE — Plan of Care (Signed)
Pt requiring 2 liters n/c at rest to maintain sats > 90%.  Order obtained for Tessalon perles for dry hacking cough in which sats drop to mid 70's.   Ayesha Mohair BSN RN [place] 08/25/2021, 1:30 AM  Problem: Clinical Measurements: Goal: Respiratory complications will improve Outcome: Not Progressing

## 2021-08-25 NOTE — Discharge Instructions (Signed)
Please continue taking all medications as directed. Please follow-up with primary care doctor here at [facility] internal medicine clinic.

## 2021-08-25 NOTE — Discharge Summary (Addendum)
Name: [name] MRN: [id number] DOB: [birth year] 51 y.o. PCP: Pcp, No  Date of Admission: 08/22/2021  5:29 AM Date of Discharge: 08/25/21 Attending Physician: Dr. Philipp Ovens  Discharge Diagnosis: Principal Problem:   Acute respiratory failure with hypoxia [facility]) Active Problems:   AIDS (acquired immune deficiency syndrome) ([place])   COPD exacerbation ([place])    Discharge Medications: Allergies as of 08/25/2021       Reactions   Metformin And Related Swelling, Rash   Pt doesn't remember this allergy, but does state he gets rash. Unsure if related to medication.        Medication List     STOP taking these medications    azithromycin 250 MG tablet Commonly known as: Zithromax   hydrOXYzine 25 MG tablet Commonly known as: ATARAX       TAKE these medications    albuterol 108 (90 Base) MCG/ACT inhaler Commonly known as: VENTOLIN HFA Inhale 2 puffs into the lungs every 6 (six) hours as needed for wheezing or shortness of breath.   guaiFENesin 100 MG/5ML liquid Commonly known as: ROBITUSSIN Take 5 mLs by mouth every 4 (four) hours.   ibuprofen 200 MG tablet Commonly known as: ADVIL Take 200 mg by mouth every 6 (six) hours as needed for mild pain.   oseltamivir 75 MG capsule Commonly known as: TAMIFLU Take 1 capsule (75 mg total) by mouth 2 (two) times daily for 3 doses.   predniSONE 20 MG tablet Commonly known as: DELTASONE Take 2 tablets (40 mg total) by mouth daily with breakfast for 1 dose. Start taking on: August 26, 2021 What changed:  when to take this additional instructions   sulfamethoxazole-trimethoprim 800-160 MG tablet Commonly known as: BACTRIM DS Take 1 tablet by mouth daily.   Symtuza 800-150-200-10 MG Tabs Generic drug: Darunavir-Cobicistat-Emtricitabine-Tenofovir Alafenamide TAKE 1 TABLET BY MOUTH 1 TIME A DAY WITH BREAKFAST What changed:  how much to take how to take this when to take this additional instructions   Tivicay 50 MG  tablet Generic drug: dolutegravir TAKE ONE TABLET BY MOUTH ONCE DAILY. STORE AT ROOM TEMPERATURE What changed:  how much to take how to take this when to take this additional instructions   triamcinolone 0.1 % cream : eucerin Crea Apply 1 application topically 3 (three) times daily as needed.   triamcinolone ointment 0.1 % Commonly known as: KENALOG Apply 1 application. topically 2 (two) times daily.               Durable Medical Equipment  (From admission, onward)           Start     Ordered   08/25/21 1025  DME Oxygen  Once       Question Answer Comment  Length of Need 6 Months   Mode or (Route) Nasal cannula   Liters per Minute 2   Frequency Continuous (stationary and portable oxygen unit needed)   Oxygen delivery system Gas      08/25/21 1025            Disposition and follow-up:   [name] was discharged from [facility] in Stable condition.  At the hospital follow up visit please address:  1.  Follow-up:  a.  Acute Hypoxic Respiratory Failure: 2/2 COPD exacerbation and (+) influenza A. Discharged on 2L, please wean as able. Discharged with prescriptions to complete 5 day course of prednisone and tamiflu.     2.  Labs / imaging needed at time of  follow-up: None  3.  Pending labs/ test needing follow-up: CD4 count   Follow-up Appointments:  Follow-up [place] Follow up on 09/24/2021.   Why: 2:30 for hospital follow up Contact information: [place] [phone] [place] [facility] Follow up.   Why: oxygen Contact information: [address] [place] [address] [phone]         [facility] Follow up in 2 week(s).   Specialty: Infectious Diseases Contact information: [address] [id number] [place] Follow up in 1 week(s).   Contact information: [address] [place] Hospital Course by problem list:  [name] is a 51 yom with AIDS and advanced COPD presenting with 1w history of cough, shortness of breath, and general malaise who was found to have acute hypoxic respiratory failure from COPD exacerbation secondary to influenza A.  Acute hypoxic respiratory failure secondary to COPD exacerbation in the setting of influenza A infection Patient presented with a 1 week history of flulike symptoms.  On admission, influenza A swab positive.  He required a new oxygen requirement of up to 6 L of nasal cannula.  At baseline patient has history of severe bronchiectasis as well as a history of pulmonary TB.  Patient was started on scheduled DuoNebs, prednisone, Tamiflu and antibiotics.  CT chest showed chronic findings of PAH, chronic bronchectasis, and chronic mucous plugging but no acute infiltrates. Prior RLL consolidation from February had resolved. Over the course of the hospitalization, patient's breathing status continued to improve.  He completed 3 days of IV ceftriaxone.  At time of discharge patient was stable and saturating well on 2 L nasal cannula.  Hypoxia improved but he was still requiring 2L [place] with ambulation on discharege. Please follow up and wean as able. Also sent home with rx to complete 5 day course of prednisone and tamiflu.    HIV infection CD4 count 106 and viral load less undetectable two months ago.  Repeat viral load here is now detectable at 40, repeat CD4 count is pending. Patient follows with our [place] outpatient.  Patient has a history of PJP and pulmonary TB.  Home medications include Symtuza,Tivicay and Bactrim for PJP prophylaxis.  These medications were continued throughout hospitalization.  Patient is to follow-up outpatient with [facility].   Discharge  Subjective: Patient states that his breathing has improved significantly.  He has been coughing less since admission, endorsing a very dry cough.  Overall, feeling much better and denies any complaints at this time.  Discharge Exam:   BP 111/84   Pulse 97   Temp 98.1 F (36.7 C)   Resp 18   Ht 5\' 6"  (1.676 m)   Wt 76.4 kg   SpO2 92%   BMI 27.18 kg/m  Constitutional: thin-appearing man in bed, in no acute distress HENT: normocephalic atraumatic, mucous membranes moist Eyes: conjunctiva non-erythematous Neck: supple Cardiovascular: regular rate and rhythm, no m/r/g Pulmonary/Chest: normal work of breathing on ; wheezing appreciated throughout right side lung fields; left side decreased breath sounds. Abdominal: soft, non-tender, non-distended MSK: normal bulk and tone Neurological: alert & oriented x 3 Skin: warm and dry Psych: Normal  mood, normal behavior  Pertinent Labs, Studies, and Procedures:     Latest Ref Rng & Units 08/25/2021    3:19 AM 08/24/2021    3:45 AM 08/22/2021    5:47 AM  CBC  WBC 4.0 - 10.5 K/uL 4.5  3.5  7.3   Hemoglobin 13.0 - 17.0 g/dL 13.5  13.4  14.1   Hematocrit 39.0 - 52.0 % 40.0  40.3  40.6   Platelets 150 - 400 K/uL 151  146  175        Latest Ref Rng & Units 08/25/2021    3:19 AM 08/24/2021    3:45 AM 08/23/2021    7:47 AM  CMP  Glucose 70 - 99 mg/dL 144  122  108   BUN 6 - 20 mg/dL 31  19  15    Creatinine 0.61 - 1.24 mg/dL 1.44  1.32  1.26   Sodium 135 - 145 mmol/L 133  137  137   Potassium 3.5 - 5.1 mmol/L 4.1  4.0  3.8   Chloride 98 - 111 mmol/L 102  101  101   CO2 22 - 32 mmol/L 24  28  26    Calcium 8.9 - 10.3 mg/dL 8.9  9.2  9.3     CT Angio Chest PE W/Cm &/Or Wo Cm  Result Date: 08/22/2021 CLINICAL DATA:  Cough and shortness of breath. EXAM: CT ANGIOGRAPHY CHEST WITH CONTRAST TECHNIQUE: Multidetector CT imaging of the chest was performed using the standard protocol during bolus administration of intravenous contrast. Multiplanar CT  image reconstructions and MIPs were obtained to evaluate the vascular anatomy. RADIATION DOSE REDUCTION: This exam was performed according to the departmental dose-optimization program which includes automated exposure control, adjustment of the mA and/or kV according to patient size and/or use of iterative reconstruction technique. CONTRAST:  7mL OMNIPAQUE IOHEXOL 350 MG/ML SOLN COMPARISON:  CT of the chest without contrast on 04/22/2021, CTA of the chest on 11/01/2020 FINDINGS: Cardiovascular: Adequate opacification of pulmonary arteries. No evidence of pulmonary embolism. Stable dilated central pulmonary arteries. Stable heart size with chronic mild dilatation both right and left ventricles. No pericardial fluid. No visualized calcified coronary artery plaque. The thoracic aorta is normal in caliber. Stable normal variant separate origin the left vertebral artery off the aortic arch. Mediastinum/Nodes: Stable small scattered mediastinal lymph nodes and mildly prominent bilateral hilar lymph node tissue likely representing chronic reactive lymph nodes. Thyroid gland, trachea, and esophagus demonstrate no significant findings. Stable small hiatal hernia. Lungs/Pleura: Stable severe chronic lung disease consisting of bullous disease, prominent bilateral pulmonary scarring and chronic bronchiectasis of the left lung with multiple areas of chronic mucous plugging and bronchial wall thickening. Dense pneumonia of the right lower lobe seen in February has resolved. No acute pulmonary consolidation, edema, pneumothorax or pleural fluid identified. Upper Abdomen: No acute abnormality. Musculoskeletal: No chest wall abnormality. No acute or significant osseous findings. Review of the MIP images confirms the above findings. IMPRESSION: 1. No evidence of pulmonary embolism. 2. Stable evidence of pulmonary hypertension with dilated central pulmonary arteries. 3. Stable probable reactive mediastinal and bilateral hilar lymph  nodes due to severe chronic lung disease. 4. Stable severe chronic lung disease with bullous disease, prominent bilateral pulmonary scarring and chronic bronchiectasis in the left lung with multiple areas of chronic mucous plugging and bronchial wall thickening. Acute pneumonia of the right lower lobe seen in February has resolved. No acute consolidation currently identified. Electronically Signed   By: Aletta Edouard M.D.   On: 08/22/2021  10:28   DG Chest Port 1 View  Result Date: 08/22/2021 CLINICAL DATA:  Chest pain and shortness of breath. EXAM: PORTABLE CHEST 1 VIEW COMPARISON:  06/04/2020 FINDINGS: Stable cardiomediastinal contours. The lungs are hyperinflated and there are advanced changes COPD/emphysema. There are extensive areas of bronchiectasis, architectural distortion and scarring within the left upper lobe and left lower lobe. Mild increase in opacities within the periphery of the left upper lobe which may reflect superimposed inflammation or infection. Right lung is clear. IMPRESSION: 1. Extensive bronchiectasis, scarring and architectural distortion in the left lung with mild superimposed opacities in the left upper lobe which may reflect new inflammation or infection. 2. Advanced COPD/emphysema.  Emphysema (ICD10-J43.9). Electronically Signed   By: Kerby Moors M.D.   On: 08/22/2021 07:56     Discharge Instructions: Discharge Instructions     Call MD for:  difficulty breathing, headache or visual disturbances   Complete by: As directed    Call MD for:  extreme fatigue   Complete by: As directed    Call MD for:  hives   Complete by: As directed    Call MD for:  persistant dizziness or light-headedness   Complete by: As directed    Call MD for:  persistant nausea and vomiting   Complete by: As directed    Call MD for:  redness, tenderness, or signs of infection (pain, swelling, redness, odor or green/yellow discharge around incision site)   Complete by: As directed    Call MD  for:  severe uncontrolled pain   Complete by: As directed    Call MD for:  temperature >100.4   Complete by: As directed    Diet - low sodium heart healthy   Complete by: As directed    Increase activity slowly   Complete by: As directed        Signed: Timothy Lasso, MD Internal Medicine Resident Pager: [phone]

## 2021-08-25 NOTE — Hospital Course (Signed)
-   Dry cough overnight, has improved - Breathing well - Intermittently take bactrim. Can't call pharmacy due to language barrier.

## 2021-08-26 ENCOUNTER — Observation Stay (HOSPITAL_COMMUNITY): Payer: BC Managed Care – PPO

## 2021-08-26 ENCOUNTER — Emergency Department (HOSPITAL_COMMUNITY): Payer: BC Managed Care – PPO

## 2021-08-26 ENCOUNTER — Encounter (HOSPITAL_COMMUNITY): Payer: Self-pay | Admitting: Emergency Medicine

## 2021-08-26 ENCOUNTER — Other Ambulatory Visit: Payer: Self-pay

## 2021-08-26 ENCOUNTER — Inpatient Hospital Stay (HOSPITAL_COMMUNITY)
Admission: EM | Admit: 2021-08-26 | Discharge: 2021-08-30 | Disposition: A | Discharge status: Home / Self Care | Payer: BC Managed Care – PPO | Source: Home / Self Care | Attending: Internal Medicine | Admitting: Internal Medicine

## 2021-08-26 DIAGNOSIS — B20 Human immunodeficiency virus [HIV] disease: Secondary | ICD-10-CM | POA: Diagnosis present

## 2021-08-26 DIAGNOSIS — J9601 Acute respiratory failure with hypoxia: Secondary | ICD-10-CM | POA: Diagnosis present

## 2021-08-26 DIAGNOSIS — J471 Bronchiectasis with (acute) exacerbation: Secondary | ICD-10-CM | POA: Diagnosis present

## 2021-08-26 DIAGNOSIS — E1165 Type 2 diabetes mellitus with hyperglycemia: Secondary | ICD-10-CM | POA: Diagnosis present

## 2021-08-26 DIAGNOSIS — T380X5A Adverse effect of glucocorticoids and synthetic analogues, initial encounter: Secondary | ICD-10-CM | POA: Diagnosis present

## 2021-08-26 DIAGNOSIS — E1122 Type 2 diabetes mellitus with diabetic chronic kidney disease: Secondary | ICD-10-CM | POA: Diagnosis present

## 2021-08-26 DIAGNOSIS — Z87891 Personal history of nicotine dependence: Secondary | ICD-10-CM

## 2021-08-26 DIAGNOSIS — J101 Influenza due to other identified influenza virus with other respiratory manifestations: Secondary | ICD-10-CM | POA: Diagnosis present

## 2021-08-26 DIAGNOSIS — Z8249 Family history of ischemic heart disease and other diseases of the circulatory system: Secondary | ICD-10-CM

## 2021-08-26 DIAGNOSIS — J479 Bronchiectasis, uncomplicated: Secondary | ICD-10-CM

## 2021-08-26 DIAGNOSIS — Z20822 Contact with and (suspected) exposure to covid-19: Secondary | ICD-10-CM | POA: Diagnosis present

## 2021-08-26 DIAGNOSIS — X58XXXA Exposure to other specified factors, initial encounter: Secondary | ICD-10-CM | POA: Diagnosis present

## 2021-08-26 DIAGNOSIS — J439 Emphysema, unspecified: Secondary | ICD-10-CM | POA: Diagnosis present

## 2021-08-26 DIAGNOSIS — Z79899 Other long term (current) drug therapy: Secondary | ICD-10-CM

## 2021-08-26 DIAGNOSIS — E559 Vitamin D deficiency, unspecified: Secondary | ICD-10-CM | POA: Diagnosis present

## 2021-08-26 DIAGNOSIS — Z86718 Personal history of other venous thrombosis and embolism: Secondary | ICD-10-CM

## 2021-08-26 DIAGNOSIS — K219 Gastro-esophageal reflux disease without esophagitis: Secondary | ICD-10-CM | POA: Diagnosis present

## 2021-08-26 DIAGNOSIS — Z933 Colostomy status: Secondary | ICD-10-CM

## 2021-08-26 DIAGNOSIS — Z8611 Personal history of tuberculosis: Secondary | ICD-10-CM

## 2021-08-26 DIAGNOSIS — T17890A Other foreign object in other parts of respiratory tract causing asphyxiation, initial encounter: Secondary | ICD-10-CM | POA: Diagnosis present

## 2021-08-26 DIAGNOSIS — J441 Chronic obstructive pulmonary disease with (acute) exacerbation: Secondary | ICD-10-CM

## 2021-08-26 DIAGNOSIS — Z86711 Personal history of pulmonary embolism: Secondary | ICD-10-CM

## 2021-08-26 DIAGNOSIS — I5082 Biventricular heart failure: Secondary | ICD-10-CM | POA: Diagnosis present

## 2021-08-26 LAB — CBC WITH DIFFERENTIAL/PLATELET
Abs Immature Granulocytes: 0.02 10*3/uL (ref 0.00–0.07)
Basophils Absolute: 0 10*3/uL (ref 0.0–0.1)
Basophils Relative: 0 %
Eosinophils Absolute: 0 10*3/uL (ref 0.0–0.5)
Eosinophils Relative: 0 %
HCT: 39.8 % (ref 39.0–52.0)
Hemoglobin: 13.4 g/dL (ref 13.0–17.0)
Immature Granulocytes: 0 %
Lymphocytes Relative: 11 %
Lymphs Abs: 0.5 10*3/uL — ABNORMAL LOW (ref 0.7–4.0)
MCH: 33.6 pg (ref 26.0–34.0)
MCHC: 33.7 g/dL (ref 30.0–36.0)
MCV: 99.7 fL (ref 80.0–100.0)
Monocytes Absolute: 0.5 10*3/uL (ref 0.1–1.0)
Monocytes Relative: 9 %
Neutro Abs: 3.8 10*3/uL (ref 1.7–7.7)
Neutrophils Relative %: 80 %
Platelets: 149 10*3/uL — ABNORMAL LOW (ref 150–400)
RBC: 3.99 MIL/uL — ABNORMAL LOW (ref 4.22–5.81)
RDW: 12.3 % (ref 11.5–15.5)
WBC: 4.8 10*3/uL (ref 4.0–10.5)
nRBC: 0 % (ref 0.0–0.2)

## 2021-08-26 LAB — I-STAT VENOUS BLOOD GAS, ED
Acid-Base Excess: 2 mmol/L (ref 0.0–2.0)
Bicarbonate: 28.2 mmol/L — ABNORMAL HIGH (ref 20.0–28.0)
Calcium, Ion: 1.22 mmol/L (ref 1.15–1.40)
HCT: 39 % (ref 39.0–52.0)
Hemoglobin: 13.3 g/dL (ref 13.0–17.0)
O2 Saturation: 100 %
Potassium: 3.5 mmol/L (ref 3.5–5.1)
Sodium: 136 mmol/L (ref 135–145)
TCO2: 30 mmol/L (ref 22–32)
pCO2, Ven: 50.5 mmHg (ref 44–60)
pH, Ven: 7.355 (ref 7.25–7.43)
pO2, Ven: 257 mmHg — ABNORMAL HIGH (ref 32–45)

## 2021-08-26 LAB — HEMOGLOBIN A1C
Hgb A1c MFr Bld: 5.9 % — ABNORMAL HIGH (ref 4.8–5.6)
Mean Plasma Glucose: 122.63 mg/dL

## 2021-08-26 LAB — COMPREHENSIVE METABOLIC PANEL
ALT: 16 U/L (ref 0–44)
AST: 22 U/L (ref 15–41)
Albumin: 2.9 g/dL — ABNORMAL LOW (ref 3.5–5.0)
Alkaline Phosphatase: 50 U/L (ref 38–126)
Anion gap: 7 (ref 5–15)
BUN: 22 mg/dL — ABNORMAL HIGH (ref 6–20)
CO2: 26 mmol/L (ref 22–32)
Calcium: 8.8 mg/dL — ABNORMAL LOW (ref 8.9–10.3)
Chloride: 102 mmol/L (ref 98–111)
Creatinine, Ser: 1.15 mg/dL (ref 0.61–1.24)
GFR, Estimated: >60 mL/min (ref >60)
Glucose, Bld: 256 mg/dL — ABNORMAL HIGH (ref 70–99)
Potassium: 3.5 mmol/L (ref 3.5–5.1)
Sodium: 135 mmol/L (ref 135–145)
Total Bilirubin: 0.4 mg/dL (ref 0.3–1.2)
Total Protein: 7.6 g/dL (ref 6.5–8.1)

## 2021-08-26 LAB — URINALYSIS, ROUTINE W REFLEX MICROSCOPIC
Bilirubin Urine: NEGATIVE
Glucose, UA: 150 mg/dL — AB
Hgb urine dipstick: NEGATIVE
Ketones, ur: NEGATIVE mg/dL
Leukocytes,Ua: NEGATIVE
Nitrite: NEGATIVE
Protein, ur: NEGATIVE mg/dL
Specific Gravity, Urine: 1.008 (ref 1.005–1.030)
pH: 7 (ref 5.0–8.0)

## 2021-08-26 LAB — TROPONIN I (HIGH SENSITIVITY)
Troponin I (High Sensitivity): 6 ng/L (ref <18)
Troponin I (High Sensitivity): 6 ng/L (ref <18)

## 2021-08-26 LAB — LACTIC ACID, PLASMA
Lactic Acid, Venous: 1.9 mmol/L (ref 0.5–1.9)
Lactic Acid, Venous: 2.6 mmol/L (ref 0.5–1.9)
Lactic Acid, Venous: 3.2 mmol/L (ref 0.5–1.9)

## 2021-08-26 LAB — RESP PANEL BY RT-PCR (FLU A&B, COVID) ARPGX2
Influenza A by PCR: POSITIVE — AB
Influenza B by PCR: NEGATIVE
SARS Coronavirus 2 by RT PCR: NEGATIVE

## 2021-08-26 LAB — BRAIN NATRIURETIC PEPTIDE: B Natriuretic Peptide: 33.5 pg/mL (ref 0.0–100.0)

## 2021-08-26 MED ORDER — ALBUTEROL SULFATE (2.5 MG/3ML) 0.083% IN NEBU
10.0000 mg | INHALATION_SOLUTION | Freq: Once | RESPIRATORY_TRACT | Status: AC
  Administered 2021-08-26: 10 mg via RESPIRATORY_TRACT
  Filled 2021-08-26: qty 12

## 2021-08-26 MED ORDER — PREDNISONE 20 MG PO TABS: 40.0000 mg | ORAL_TABLET | Freq: Every day | ORAL | Status: DC

## 2021-08-26 MED ORDER — IPRATROPIUM-ALBUTEROL 0.5-2.5 (3) MG/3ML IN SOLN
3.0000 mL | RESPIRATORY_TRACT | Status: DC | PRN
Start: 2021-08-26 — End: 2021-08-30

## 2021-08-26 MED ORDER — DARUN-COBIC-EMTRICIT-TENOFAF 800-150-200-10 MG PO TABS
1.0000 | ORAL_TABLET | Freq: Every day | ORAL | Status: DC
  Administered 2021-08-27 – 2021-08-30 (×4): 1 via ORAL
  Filled 2021-08-26 (×4): qty 1

## 2021-08-26 MED ORDER — METHYLPREDNISOLONE SODIUM SUCC 125 MG IJ SOLR
125.0000 mg | Freq: Once | INTRAMUSCULAR | Status: AC
  Administered 2021-08-26: 125 mg via INTRAVENOUS
  Filled 2021-08-26: qty 2

## 2021-08-26 MED ORDER — DOLUTEGRAVIR SODIUM 50 MG PO TABS
50.0000 mg | ORAL_TABLET | Freq: Every day | ORAL | Status: DC
  Administered 2021-08-27 – 2021-08-30 (×4): 50 mg via ORAL
  Filled 2021-08-26 (×4): qty 1

## 2021-08-26 MED ORDER — SULFAMETHOXAZOLE-TRIMETHOPRIM 800-160 MG PO TABS
1.0000 | ORAL_TABLET | Freq: Every day | ORAL | Status: DC
  Administered 2021-08-27 – 2021-08-30 (×4): 1 via ORAL
  Filled 2021-08-26 (×4): qty 1

## 2021-08-26 MED ORDER — GUAIFENESIN 100 MG/5ML PO LIQD
5.0000 mL | ORAL | Status: DC | PRN
Start: 2021-08-26 — End: 2021-08-30

## 2021-08-26 MED ORDER — ENOXAPARIN SODIUM 40 MG/0.4ML IJ SOSY
40.0000 mg | PREFILLED_SYRINGE | INTRAMUSCULAR | Status: DC
  Administered 2021-08-26 – 2021-08-29 (×4): 40 mg via SUBCUTANEOUS
  Filled 2021-08-26 (×4): qty 0.4

## 2021-08-26 MED ORDER — OSELTAMIVIR PHOSPHATE 75 MG PO CAPS
75.0000 mg | ORAL_CAPSULE | Freq: Two times a day (BID) | ORAL | Status: DC
  Administered 2021-08-26: 75 mg via ORAL
  Filled 2021-08-26: qty 1

## 2021-08-26 NOTE — ED Triage Notes (Signed)
Pt here via [facility] from home for shob. Pt was d/c from [facility] yesterday and was prescribed tamiflu and prednisone. Upon EMS arrival pt's RA SpO2 was 66%. Ems gave 2 duonebs, increased SpO2 to 96%. NS given, 20g LAC. 115HR, 118/78

## 2021-08-26 NOTE — ED Provider Notes (Signed)
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 male. Patient presents to the emergency department with shortness of breath.  He was recently admitted for influenza and COPD exasperation.  He was discharged yesterday afternoon and prescribed Tamiflu and 1 dose of prednisone.  He called EMS today and they found patient's SPO2 was 66%.  EMS gave him 2 DuoNebs and placed him on 5 L of oxygen.  Oxygen had improved to 96%.  States that he was having some chest tightness yesterday, but he is no longer having this today.  States that he was discharged on 2 L of nasal cannula oxygen which he has been using at home.  He states that he felt like he was starting to not breathe well yesterday and had worsening cough as soon as he got home and that this continued to worsen. He states he was compliant with the medications that he was given.  He does have a reported history in his chart of pulmonary embolism, but per patient he is not on any anticoagulation.   Shortness of Breath Associated symptoms: cough and wheezing   Associated symptoms: no abdominal pain, no fever and no vomiting        Home Medications Prior to Admission medications   Medication Sig Start Date End Date Taking? Authorizing Provider  albuterol (VENTOLIN HFA) 108 (90 Base) MCG/ACT inhaler Inhale 2 puffs into the lungs every 6 (six) hours as needed for wheezing or shortness of breath. Patient not taking: Reported on 08/04/2021 03/27/21   Prosperi, Christian  H, PA-C  Darunavir-Cobicistat-Emtricitabine-Tenofovir Alafenamide (SYMTUZA) 800-150-200-10 MG TABS TAKE 1 TABLET BY MOUTH 1 TIME A DAY WITH BREAKFAST Patient taking differently: Take 1 tablet by mouth daily with breakfast. 08/04/21   Carlyle Basques, MD  dolutegravir (TIVICAY) 50 MG tablet TAKE ONE TABLET BY MOUTH ONCE DAILY. STORE AT ROOM TEMPERATURE Patient taking differently: Take 50 mg by mouth daily. 08/04/21   Carlyle Basques, MD  guaiFENesin (ROBITUSSIN) 100 MG/5ML liquid Take 5 mLs by mouth every 4 (four) hours. 08/04/21   Carlyle Basques, MD  ibuprofen (ADVIL) 200 MG tablet Take 200 mg by mouth every 6 (six) hours as needed for mild pain.    [provider]  oseltamivir (TAMIFLU) 75 MG capsule Take 1 capsule (75 mg total) by mouth 2 (two) times daily for 3 doses. 08/25/21 08/27/21  Timothy Lasso, MD  predniSONE (DELTASONE) 20 MG tablet Take 2 tablets (40 mg total) by mouth daily with breakfast for 1 dose. 08/26/21 08/27/21  Timothy Lasso, MD  sulfamethoxazole-trimethoprim (BACTRIM DS) 800-160 MG tablet Take 1 tablet by mouth daily. 08/25/21 11/23/21  Timothy Lasso, MD  Triamcinolone Acetonide (TRIAMCINOLONE 0.1 % CREAM : EUCERIN) CREA Apply 1 application topically 3 (three) times daily as needed. Patient not taking: Reported on 08/22/2021 05/05/21   Sumner Callas, NP  triamcinolone ointment (KENALOG) 0.1 % Apply 1 application. topically 2 (two) times daily. Patient not taking: Reported on 08/22/2021 08/04/21   Carlyle Basques, MD  Allergies    Metformin and related    Review of Systems   Review of Systems  Constitutional:  Negative for fever.  Respiratory:  Positive for cough, chest tightness, shortness of breath and wheezing.   Cardiovascular:  Negative for leg swelling.  Gastrointestinal:  Negative for abdominal pain, nausea and vomiting.  Allergic/Immunologic: Positive for immunocompromised state.  All other systems reviewed and are negative.   Physical  Exam Updated Vital Signs BP 126/73   Pulse 94   Temp 98 F (36.7 C) (Oral)   Resp 18   Ht 5\' 6"  (1.676 m)   Wt 76.4 kg   SpO2 93%   BMI 27.18 kg/m  Physical Exam Vitals and nursing note reviewed.  Constitutional:      General: He is in acute distress.     Appearance: Normal appearance. He is ill-appearing. He is not toxic-appearing or diaphoretic.  HENT:     Head: Normocephalic and atraumatic.     Nose: No nasal deformity.     Mouth/Throat:     Lips: Pink. No lesions.     Mouth: No injury, lacerations, oral lesions or angioedema.     Pharynx: Uvula midline. No posterior oropharyngeal erythema or uvula swelling.  Eyes:     General: Gaze aligned appropriately. No scleral icterus.       Right eye: No discharge.        Left eye: No discharge.     Conjunctiva/sclera: Conjunctivae normal.     Right eye: Right conjunctiva is not injected. No exudate or hemorrhage.    Left eye: Left conjunctiva is not injected. No exudate or hemorrhage. Neck:     Vascular: No JVD.     Trachea: No tracheal deviation.  Cardiovascular:     Rate and Rhythm: Regular rhythm. Tachycardia present.     Pulses: Normal pulses.          Radial pulses are 2+ on the right side and 2+ on the left side.       Dorsalis pedis pulses are 2+ on the right side and 2+ on the left side.     Heart sounds: Normal heart sounds, S1 normal and S2 normal. Heart sounds not distant. No murmur heard.    No friction rub. No gallop. No S3 or S4 sounds.  Pulmonary:     Effort: Pulmonary effort is normal. Tachypnea present. No accessory muscle usage or respiratory distress.     Breath sounds: No stridor. Examination of the right-upper field reveals wheezing and rhonchi. Examination of the left-upper field reveals wheezing and rhonchi. Examination of the right-middle field reveals wheezing and rhonchi. Examination of the left-middle field reveals wheezing and rhonchi. Examination of the right-lower field reveals wheezing and rhonchi.  Examination of the left-lower field reveals wheezing and rhonchi. Wheezing and rhonchi present. No rales.  Chest:     Chest wall: No tenderness.  Abdominal:     General: Abdomen is flat. Bowel sounds are normal. There is no distension.     Palpations: Abdomen is soft. There is no mass or pulsatile mass.     Tenderness: There is no abdominal tenderness. There is no guarding or rebound.  Musculoskeletal:     Right lower leg: No tenderness. No edema.     Left lower leg: No tenderness. No edema.  Skin:    General: Skin is warm and dry.     Coloration: Skin is not jaundiced or pale.     Findings: No bruising, erythema, lesion or rash.  Neurological:     General: No focal deficit present.     Mental Status: He is alert and oriented to person, place, and time.     GCS: GCS eye subscore is 4. GCS verbal subscore is 5. GCS motor subscore is 6.  Psychiatric:        Mood and Affect: Mood normal.        Behavior: Behavior normal. Behavior is cooperative.     ED Results / Procedures / Treatments   Labs (all labs ordered are listed, but only abnormal results are displayed) Labs Reviewed  CBC WITH DIFFERENTIAL/PLATELET - Abnormal; Notable for the following components:      Result Value   RBC 3.99 (*)    Platelets 149 (*)    Lymphs Abs 0.5 (*)    All other components within normal limits  COMPREHENSIVE METABOLIC PANEL - Abnormal; Notable for the following components:   Glucose, Bld 256 (*)    BUN 22 (*)    Calcium 8.8 (*)    Albumin 2.9 (*)    All other components within normal limits  I-STAT VENOUS BLOOD GAS, ED - Abnormal; Notable for the following components:   pO2, Ven 257 (*)    Bicarbonate 28.2 (*)    All other components within normal limits  CULTURE, BLOOD (ROUTINE X 2)  CULTURE, BLOOD (ROUTINE X 2)  RESP PANEL BY RT-PCR (FLU A&B, COVID) ARPGX2  BRAIN NATRIURETIC PEPTIDE  LACTIC ACID, PLASMA  LACTIC ACID, PLASMA  URINALYSIS, ROUTINE W REFLEX MICROSCOPIC  TROPONIN I (HIGH  SENSITIVITY)  TROPONIN I (HIGH SENSITIVITY)    EKG None  Radiology DG Chest Port 1 View  Result Date: 08/26/2021 CLINICAL DATA:  Shortness of breath. EXAM: PORTABLE CHEST 1 VIEW COMPARISON:  AP chest 08/22/2021, 06/04/2021, 04/16/2021, 03/27/2021; CT chest 08/22/2021 FINDINGS: Heart size is again at the upper limits of normal. Mediastinal contours are within normal limits. There is left lower lobe greater than left upper lobe and right lower lobe bronchiectasis with associated scarring better seen on prior CT. The left mid and lower lung dominant scarring appears unchanged from multiple prior radiographs. There is again flattening of the diaphragms and increased lucent emphysematous change. No definite increased airspace density compared to baseline radiographs. No definite pleural effusion. No acute skeletal abnormality. IMPRESSION: 1. Redemonstration of extensive left mid and lower lung bronchiectasis and scarring. 2. No definite acute airspace opacity is seen over baseline radiographs to indicate an acute lung process. 3. Advanced COPD/emphysema. Electronically Signed   By: Yvonne Kendall M.D.   On: 08/26/2021 16:26    Procedures .Critical Care  Performed by: Adolphus Birchwood, PA-C Authorized by: Adolphus Birchwood, PA-C   Critical care provider statement:    Critical care time (minutes):  45   Critical care time was exclusive of:  Separately billable procedures and treating other patients   Critical care was necessary to treat or prevent imminent or life-threatening deterioration of the following conditions:  Respiratory failure   Critical care was time spent personally by me on the following activities:  Blood draw for specimens, development of treatment plan with patient or surrogate, discussions with consultants, discussions with primary provider, evaluation of patient's response to treatment, examination of patient, obtaining history from patient or surrogate, review of old charts,  re-evaluation of patient's condition, pulse oximetry, ordering and review of radiographic studies, ordering and review of laboratory studies and ordering and performing treatments and interventions   Care discussed with: admitting provider  This patient was on telemetry or cardiac monitoring during their time in the ED.    Medications Ordered in ED Medications  methylPREDNISolone sodium succinate (SOLU-MEDROL) 125 mg/2 mL injection 125 mg (125 mg Intravenous Given 08/26/21 1647)  albuterol (PROVENTIL) (2.5 MG/3ML) 0.083% nebulizer solution 10 mg (10 mg Nebulization Given 08/26/21 1643)    ED Course/ Medical Decision Making/ A&P                           Medical Decision Making Amount and/or Complexity of Data Reviewed Labs: ordered. Radiology: ordered.  Risk Prescription drug management. Decision regarding hospitalization.    MDM  This is a 51 y.o. male who presents to the ED with shortness of breath The differential of this patient includes but is not limited to CHF, ACS, COPD, Asthma, PNA, Anaphylaxis, PE, PTX, Anxiety, Viral PNA, and Bronchitis.   My Impression, Plan, and ED Course:  The patient who was recently discharged yesterday with influenza and COPD exasperation.  He has HIV and is considered in AIDS with CD4 of 100.  He is presenting with worsening shortness of breath and was found today extremely hypoxic by EMS.  He is currently on 5 L facemask and has had 2 DuoNebs. Patient appears to have some labored breathing with notable bilateral wheezing and rhonchi in all lung fields.  He is tachycardic in the 110s and is oxygenating at 97% on 5 L.  He is afebrile.  Giving 125 mg Solu-Medrol, trial of 1 h CAP tx. Since he is immunocompromised, will draw blood cultures and lactate.   I personally ordered, reviewed, and interpreted all laboratory work and imaging and agree with radiologist interpretation. Results interpreted below:  EKG: Sinus tachycardia, appears  unchanged CXR: Stable left mid and lower lung bronchiectasis and scaring. No acute airspace opacity noted. Advanced COPD changes.  CBC with stable findings. No leukocytosis CMP with glucose of 256, no acidosis or anion gap. Electrolytes stable. Kidney and LFTs stable. Lactate 1.9 BNP 33 Troponin 6 VBG without acidosis/akylosis Blood cultures pending.  Assessment, patient is still very wheezy with labored breathing after 1 hour of CAP Treatment.  He was able to be weaned down to 2 L, but this was increased for nebulizer treatment. On 8 L now. Vitals have improved. This is a very high risk patient for decompensation so I recommend readmission at this time.   I have consulted with Internal Medicine Service who has agreed to accept patient back to their service.  Charting Requirements Additional history is obtained from:  Independent historian External Records from outside source obtained and reviewed including: Reviewed recent hospitalization notes, CD4, and viral load Social Determinants of Health:   language barrier Pertinant PMH that complicates patient's illness: HIV/AIDs, COPD  Patient Care Problems that were addressed during this visit: - Acute hypoxic respiratory failure: Acute illness with systemic symptoms - COPD exacerbation: Acute illness with systemic symptoms This patient was maintained on a cardiac monitor/telemetry. I personally viewed and interpreted the cardiac monitor which reveals an underlying rhythm of ST Medications given in ED: duoneb x 2, 1 hour of CAP, 125 solumedrol  Reevaluation of the patient after these medicines showed that the patient improved I have reviewed home medications Critical Care Interventions: CAP, Oxygen therapy Consultations: Internal Medicine Service Disposition: Admit  This is a shared visit with my attending physician, Dr. Roslynn Amble.  We have discussed this patient and they have independently evaluated this patient. The plan was altered  or  changed as needed.  Portions of this note were generated with [employer]. Dictation errors may occur despite best attempts at proofreading.    Final Clinical Impression(s) / ED Diagnoses Final diagnoses:  Acute respiratory failure with hypoxia ([place])  COPD exacerbation [facility])    Rx / DC Orders ED Discharge Orders     None         Adolphus Birchwood, PA-C 08/26/21 1818    Lucrezia Starch, MD 08/26/21 2004

## 2021-08-26 NOTE — ED Notes (Signed)
Notified internal medicine provider of pt's critical lactic acid

## 2021-08-26 NOTE — ED Notes (Signed)
Notified provider of pt's critical lactic acid 2.6

## 2021-08-26 NOTE — ED Notes (Signed)
Pt returned from CT °

## 2021-08-26 NOTE — H&P (Cosign Needed Addendum)
Date: 08/26/2021               Patient Name:  [name] MRN: [id number]  DOB: 01-Aug-1970 Age / Sex: 51 y.o., male   PCP: Pcp, No         Medical Service: Internal Medicine Teaching Service         Attending Physician: Dr. Velna Ochs, MD    First Contact: Timothy Lasso, MD Pager: SA [phone]  Second Contact: Gaylan Gerold, DO Pager: Liliane Shi [phone]       After Hours (After 5p/  First Contact Pager: [phone]  weekends / holidays): Second Contact Pager: [phone]   SUBJECTIVE   Chief Complaint: Cough, Short of Breath  History of Present Illness:   [name] is a 51 y.o. 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 Interpreting services was used during his interview. He was discharged on 08/25/21 with 2L [place] which he needed with ambulation as well as prednisone and Tamiflu. He says that he went home and was doing alright but then started feeling more short of breath so he tried using his oxygen. However he could not catch his breath due to coughing spells. A friend came over to check on him and the friend called EMS because of how short of breath he was. Says that his cough is dry, is not bringing up any mucous. Does not have any medication to help with his cough. He denies missing any of his medications. Endorses chest pain that only occurs with coughing. Denies fever or chills, changes in bowel habits, and denies difficulty with eating or drinking.   ED Course: Upon arrival via EMS patient's SpO2 was 66%, this improved to 96% with 2 duonebs and 5 L [place]. He was tachycardic to the 110s but afebrile with normal BP. CXR redemonstrated advanced COPD/Emphysema with extensive brocnchiectasis and scarring of the left mid and lower lung but did not show any acute infiltrates.  Medicine was paged for admission.  Meds:  No outpatient medications have been marked as taking for the 08/26/21 encounter [facility] Encounter).   No current facility-administered medications on file prior to encounter.   Current Outpatient Medications on File Prior to Encounter  Medication Sig Dispense Refill   albuterol (VENTOLIN HFA) 108 (90 Base) MCG/ACT inhaler Inhale 2 puffs into the lungs every 6 (six) hours as needed for wheezing or shortness of breath. (Patient not taking: Reported on 08/04/2021) 18 g 2   Darunavir-Cobicistat-Emtricitabine-Tenofovir Alafenamide (SYMTUZA) 800-150-200-10 MG TABS TAKE 1 TABLET BY MOUTH 1 TIME A DAY WITH BREAKFAST (Patient taking differently: Take 1 tablet by mouth daily with breakfast.) 30 tablet 5   dolutegravir (TIVICAY) 50 MG tablet TAKE ONE TABLET BY MOUTH ONCE DAILY. STORE AT ROOM TEMPERATURE (Patient taking differently: Take 50 mg by mouth daily.) 30 tablet 5   guaiFENesin (ROBITUSSIN) 100 MG/5ML liquid Take 5 mLs by mouth every 4 (four) hours. 120 mL 0   ibuprofen (ADVIL) 200 MG tablet Take 200 mg by mouth every 6 (six) hours as needed for mild pain.     oseltamivir (TAMIFLU) 75 MG capsule Take 1 capsule (75 mg total) by mouth 2 (two) times daily for 3 doses. 3 capsule 0   predniSONE (DELTASONE) 20 MG tablet Take 2 tablets (40 mg total) by  mouth daily with breakfast for 1 dose. 2 tablet 0   sulfamethoxazole-trimethoprim (BACTRIM DS) 800-160 MG tablet Take 1 tablet by mouth daily. 90 tablet 0   Triamcinolone Acetonide (TRIAMCINOLONE 0.1 % CREAM : EUCERIN) CREA Apply 1 application topically 3 (three) times daily as needed. (Patient not taking: Reported on 08/22/2021) 1 each 0   triamcinolone ointment (KENALOG) 0.1 % Apply 1 application. topically 2 (two) times daily. (Patient not taking: Reported on 08/22/2021) 453.6 g 1    Past Medical History:  Diagnosis Date   Acute pulmonary embolism ([place]) 11/01/2020   Acute pulmonary embolus ([place]) 02/24/2016   Dx  December 2017 taking Eliquis 5mg  BID   Bronchiectasis with acute exacerbation ([place])    Depression    "stress and depression for any man is common" (03/21/2014)   Diabetes mellitus without complication ([place])    DVT (deep venous thrombosis) ([place]) 11/03/2020   Dyspnea    Genital warts 01/04/2017   Hepatitis    "I don't know what hepatitis I have"   HIV disease ([place])    MSSA bacteremia    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[place]) 04/24/2016   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[place])    TB (pulmonary tuberculosis)    previously treated according to refugee documentation    Past Surgical History:  Procedure Laterality Date   BRONCHIAL WASHINGS Bilateral 04/23/2021   Procedure: BRONCHIAL WASHINGS;  Surgeon: Candee Furbish, MD;  Location: [facility] ENDOSCOPY;  Service: Pulmonary;  Laterality: Bilateral;   FRACTURE SURGERY     IM NAILING TIBIA Right 03/21/2014   ORIF ANKLE FRACTURE Right 03/21/2014   lateral malleolus/notes 03/21/2014   ORIF ANKLE FRACTURE Right 03/21/2014   Procedure: OPEN REDUCTION INTERNAL FIXATION (ORIF) pilon ;  Surgeon: Marybelle Killings, MD;  Location: [place];  Service: Orthopedics;  Laterality: Right;   TEE WITHOUT CARDIOVERSION N/A 05/23/2019   Procedure: TRANSESOPHAGEAL ECHOCARDIOGRAM (TEE);  Surgeon: Skeet Latch, MD;  Location: [place];  Service: Cardiovascular;  Laterality: N/A;   TIBIA IM NAIL INSERTION Right 03/21/2014   Procedure: INTRAMEDULLARY (IM) NAIL TIBIAL;  Surgeon: Marybelle Killings, MD;  Location: [place];  Service: Orthopedics;  Laterality: Right;   VIDEO BRONCHOSCOPY Bilateral 03/02/2016   Procedure: VIDEO BRONCHOSCOPY WITHOUT FLUORO;  Surgeon: Javier Glazier, MD;  Location: [place];  Service: Cardiopulmonary;  Laterality: Bilateral;   VIDEO BRONCHOSCOPY Left 04/23/2021   Procedure: VIDEO BRONCHOSCOPY WITHOUT FLUORO;  Surgeon: Candee Furbish, MD;  Location: [facility] ENDOSCOPY;  Service: Pulmonary;  Laterality: Left;    Social:  Lives:  Alone Support: Friends in the area Level of Function: Able to perform ADL/IADL without assistance PCP: Was going to establish with [facility] Substances: Denies tobacco or alcohol use. Quit tobacco in 2015, was 1PPD for 10 years. Prior alcohol use daily. No other recreational drug use  Family History:  No family history of lung disease Family History  Problem Relation Age of Onset   Hypertension Other    Heart disease Sister     Allergies: Allergies as of 08/26/2021 - Review Complete 08/26/2021  Allergen Reaction Noted   Metformin and related Swelling and Rash 02/27/2021    Review of Systems: A complete ROS was negative except as per HPI.   OBJECTIVE:   Physical Exam: Blood pressure 130/87, pulse 94, temperature 98 F (36.7 C), temperature source Oral, resp. rate 18, height 5\' 6"  (1.676 m), weight 76.4 kg, SpO2 94 %.  Constitutional: thin, chronically ill appearing man resting in bed,  in no acute distress HEENT: normocephalic atraumatic, mucous membranes moist, conjunctiva non-erythematous Cardiovascular: tachycardic, regular rhythm, no m/r/g Pulmonary/Chest: normal WOB on 2 L , coughing triggered by deep breaths, diffusely rhonchorous Abdominal: soft, non-tender, non-distended, ostomy in place MSK: normal bulk and tone Neurological: alert & oriented x 3, answering questions appropriately Skin: warm and dry Psych: normal affect  Labs: CBC    Component Value Date/Time   WBC 4.8 08/26/2021 1659   RBC 3.99 (L) 08/26/2021 1659   HGB 13.3 08/26/2021 1733   HGB 13.6 04/23/2021 0102   HCT 39.0 08/26/2021 1733   HCT 26.3 (L) 11/11/2018 0151   PLT 149 (L) 08/26/2021 1659   MCV 99.7 08/26/2021 1659   MCH 33.6 08/26/2021 1659   MCHC 33.7 08/26/2021 1659   RDW 12.3 08/26/2021 1659   LYMPHSABS 0.5 (L) 08/26/2021 1659   MONOABS 0.5 08/26/2021 1659   EOSABS 0.0 08/26/2021 1659   BASOSABS 0.0 08/26/2021 1659     CMP     Component Value Date/Time   NA 136 08/26/2021 1733   K  3.5 08/26/2021 1733   CL 102 08/26/2021 1659   CO2 26 08/26/2021 1659   GLUCOSE 256 (H) 08/26/2021 1659   BUN 22 (H) 08/26/2021 1659   CREATININE 1.15 08/26/2021 1659   CREATININE 1.24 12/24/2020 1124   CALCIUM 8.8 (L) 08/26/2021 1659   PROT 7.6 08/26/2021 1659   ALBUMIN 2.9 (L) 08/26/2021 1659   AST 22 08/26/2021 1659   ALT 16 08/26/2021 1659   ALKPHOS 50 08/26/2021 1659   BILITOT 0.4 08/26/2021 1659   GFRNONAA >60 08/26/2021 1659   GFRNONAA 68 12/04/2019 1118   GFRAA 79 12/04/2019 1118    Imaging: DG Chest Port 1 View  Result Date: 08/26/2021 CLINICAL DATA:  Shortness of breath. EXAM: PORTABLE CHEST 1 VIEW COMPARISON:  AP chest 08/22/2021, 06/04/2021, 04/16/2021, 03/27/2021; CT chest 08/22/2021 FINDINGS: Heart size is again at the upper limits of normal. Mediastinal contours are within normal limits. There is left lower lobe greater than left upper lobe and right lower lobe bronchiectasis with associated scarring better seen on prior CT. The left mid and lower lung dominant scarring appears unchanged from multiple prior radiographs. There is again flattening of the diaphragms and increased lucent emphysematous change. No definite increased airspace density compared to baseline radiographs. No definite pleural effusion. No acute skeletal abnormality. IMPRESSION: 1. Redemonstration of extensive left mid and lower lung bronchiectasis and scarring. 2. No definite acute airspace opacity is seen over baseline radiographs to indicate an acute lung process. 3. Advanced COPD/emphysema. Electronically Signed   By: Yvonne Kendall M.D.   On: 08/26/2021 16:26    EKG: personally reviewed my interpretation is sinus tachycardia, with T wave inversions in V1 and V2 unchanged from prior  ASSESSMENT & PLAN:   Assessment & Plan by Problem: Active Problems:   Acute respiratory failure with hypoxia ([name])   [name] is a 51 y.o. person living with a history of AIDS, CHF, T2DM, s/p colostomy 2/2  perianal condyloma, Hx of DVT/PE in 2017, Hx of treated pulmonary TB, PJP, advanced COPD, and a recent admission for a COPD exacerbation secondary to influenza A who is admitted for ongoing cough and shortness of breath.   Acute on chronic hypoxic respiratory failure with concern for new infection COPD  Patient coming in with ongoing symptoms of cough and shortness of breath. Normal white count, no fevers, chills, or productive cough. VBG showed adequate oxygenation. CXR on admission was unchanged  and CTA from prior admission did not show a PE. However given his immunocompromised status he is at increased risk for an underlying infection especially having received steroids. His lactic acid increased to 2.6 and then 3.2. Based on this, obtained a CT chest which showed new air fluid levels that are worrisome for infection. He had a bronchoscopy in February of this year with all cultures including acid fast smears negative for bacteria. In the short time since then it is unlikely that he contracted TB, however can consider retesting if he fails to respond to therapy. Will start him on empiric broad spectrum antibiotics to cover both HCAP and atypicals - continuous pulse ox - wean oxygen as able - oxygen goal 88-92 - robitussin - duonebs Q4 PRN - stop prednisone, continue tamiflu for 2 more doses - trend lactic acid - f/u blood cultures - f/u legionella and strep pneumo antigen - vanc and unasyn, narrow as able  Hx of multiple prior DVT/PEs Not on long term anticoagulation, unclear as to why. He has had two prior PE, one in 2017 in the right lung and one in 2022 in the left lung. He had a negative CTA on admission on the 9th and was on DVT Ppx during admission.  - discuss with patient if he was ever on long term anticoagulation in the morning - continue DVT PPx  AIDS with history of PJP and pulmonary TB Last CD4 count 100 from two months ago, repeat from prior hospitalization still in process.  Viral load undetectable. Home medications include Symtuza,Tivicay and Bactrim for PJP prophylaxis. Last quanterferon gold was negative 5 years ago.  - continue symtuza, tivicay, and bactrim  Biventricular HF Last echo from 6 months ago showed mildly reduced LVEF of 45-50 with global hypokinesis. RV shows moderately reduced function with global hypokinesis. Today he appears euvolemic on exam. Will have to be careful IV fluids if needed - cardiac monitoring   Diet controlled T2DM Patient has not had an A1c checked in 9 months. Glucose elevated to 256 on admission with 150 glucosuria in the setting of steroids. - f/u A1c  GERD Patient complaining of epigastric pain with some nausea that gets worse with spicy foods. Says that it gets better when he drinks milk. 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  Signed: Scarlett Presto, MD Internal Medicine Resident PGY-1  08/26/2021, 9:41 PM

## 2021-08-27 DIAGNOSIS — J9601 Acute respiratory failure with hypoxia: Secondary | ICD-10-CM

## 2021-08-27 DIAGNOSIS — J471 Bronchiectasis with (acute) exacerbation: Secondary | ICD-10-CM | POA: Diagnosis not present

## 2021-08-27 DIAGNOSIS — J441 Chronic obstructive pulmonary disease with (acute) exacerbation: Secondary | ICD-10-CM

## 2021-08-27 LAB — CBC WITH DIFFERENTIAL/PLATELET
Abs Immature Granulocytes: 0.01 10*3/uL (ref 0.00–0.07)
Basophils Absolute: 0 10*3/uL (ref 0.0–0.1)
Basophils Relative: 0 %
Eosinophils Absolute: 0 10*3/uL (ref 0.0–0.5)
Eosinophils Relative: 0 %
HCT: 41.1 % (ref 39.0–52.0)
Hemoglobin: 13.7 g/dL (ref 13.0–17.0)
Immature Granulocytes: 0 %
Lymphocytes Relative: 10 %
Lymphs Abs: 0.3 10*3/uL — ABNORMAL LOW (ref 0.7–4.0)
MCH: 34 pg (ref 26.0–34.0)
MCHC: 33.3 g/dL (ref 30.0–36.0)
MCV: 102 fL — ABNORMAL HIGH (ref 80.0–100.0)
Monocytes Absolute: 0.1 10*3/uL (ref 0.1–1.0)
Monocytes Relative: 2 %
Neutro Abs: 2.3 10*3/uL (ref 1.7–7.7)
Neutrophils Relative %: 88 %
Platelets: 156 10*3/uL (ref 150–400)
RBC: 4.03 MIL/uL — ABNORMAL LOW (ref 4.22–5.81)
RDW: 12.6 % (ref 11.5–15.5)
WBC: 2.6 10*3/uL — ABNORMAL LOW (ref 4.0–10.5)
nRBC: 0 % (ref 0.0–0.2)

## 2021-08-27 LAB — VITAMIN B12: Vitamin B-12: 530 pg/mL (ref 180–914)

## 2021-08-27 LAB — RESPIRATORY PANEL BY PCR

## 2021-08-27 LAB — MRSA NEXT GEN BY PCR, NASAL: MRSA by PCR Next Gen: NOT DETECTED

## 2021-08-27 LAB — BASIC METABOLIC PANEL
Anion gap: 12 (ref 5–15)
BUN: 19 mg/dL (ref 6–20)
CO2: 23 mmol/L (ref 22–32)
Calcium: 9.1 mg/dL (ref 8.9–10.3)
Chloride: 99 mmol/L (ref 98–111)
Creatinine, Ser: 1.27 mg/dL — ABNORMAL HIGH (ref 0.61–1.24)
GFR, Estimated: >60 mL/min (ref >60)
Glucose, Bld: 278 mg/dL — ABNORMAL HIGH (ref 70–99)
Potassium: 4.4 mmol/L (ref 3.5–5.1)
Sodium: 134 mmol/L — ABNORMAL LOW (ref 135–145)

## 2021-08-27 LAB — CULTURE, BLOOD (ROUTINE X 2)
Culture: NO GROWTH
Culture: NO GROWTH
Special Requests: ADEQUATE

## 2021-08-27 LAB — LACTIC ACID, PLASMA
Lactic Acid, Venous: 4.4 mmol/L (ref 0.5–1.9)
Lactic Acid, Venous: 2.2 mmol/L (ref 0.5–1.9)
Lactic Acid, Venous: 1.8 mmol/L (ref 0.5–1.9)

## 2021-08-27 LAB — VITAMIN D 25 HYDROXY (VIT D DEFICIENCY, FRACTURES): Vit D, 25-Hydroxy: 12.22 ng/mL — ABNORMAL LOW (ref 30–100)

## 2021-08-27 LAB — T-HELPER CELLS (CD4) COUNT (NOT AT ARMC)
CD4 % Helper T Cell: 16 % — ABNORMAL LOW (ref 33–65)
CD4 T Cell Abs: 104 /uL — ABNORMAL LOW (ref 400–1790)

## 2021-08-27 LAB — STREP PNEUMONIAE URINARY ANTIGEN: Strep Pneumo Urinary Antigen: NEGATIVE

## 2021-08-27 MED ORDER — SODIUM CHLORIDE 0.9 % IV SOLN: 3.0000 g | Freq: Four times a day (QID) | INTRAVENOUS | Status: DC

## 2021-08-27 MED ORDER — LACTATED RINGERS IV SOLN: INTRAVENOUS | Status: AC

## 2021-08-27 MED ORDER — VANCOMYCIN HCL 1500 MG/300ML IV SOLN
1500.0000 mg | Freq: Once | INTRAVENOUS | Status: AC
  Administered 2021-08-27: 1500 mg via INTRAVENOUS
  Filled 2021-08-27: qty 300

## 2021-08-27 MED ORDER — ONDANSETRON 4 MG PO TBDP
4.0000 mg | ORAL_TABLET | Freq: Once | ORAL | Status: AC
  Administered 2021-08-27: 4 mg via ORAL
  Filled 2021-08-27: qty 1

## 2021-08-27 MED ORDER — SODIUM CHLORIDE 0.9 % IV SOLN
3.0000 g | Freq: Once | INTRAVENOUS | Status: AC
  Administered 2021-08-27: 3 g via INTRAVENOUS
  Filled 2021-08-27: qty 8

## 2021-08-27 MED ORDER — VITAMIN D (ERGOCALCIFEROL) 1.25 MG (50000 UNIT) PO CAPS
50000.0000 [IU] | ORAL_CAPSULE | ORAL | Status: DC
  Administered 2021-08-27: 50000 [IU] via ORAL
  Filled 2021-08-27 (×2): qty 1

## 2021-08-27 MED ORDER — BUDESONIDE 0.5 MG/2ML IN SUSP
0.5000 mg | Freq: Two times a day (BID) | RESPIRATORY_TRACT | Status: DC
  Administered 2021-08-27 – 2021-08-30 (×7): 0.5 mg via RESPIRATORY_TRACT
  Filled 2021-08-27 (×7): qty 2

## 2021-08-27 MED ORDER — METHYLPREDNISOLONE SODIUM SUCC 40 MG IJ SOLR
40.0000 mg | Freq: Two times a day (BID) | INTRAMUSCULAR | Status: DC
  Administered 2021-08-27 – 2021-08-28 (×3): 40 mg via INTRAVENOUS
  Filled 2021-08-27 (×3): qty 1

## 2021-08-27 MED ORDER — CEFEPIME HCL 2 G IV SOLR
2.0000 g | Freq: Three times a day (TID) | INTRAVENOUS | Status: DC
  Administered 2021-08-27 – 2021-08-30 (×10): 2 g via INTRAVENOUS
  Filled 2021-08-27 (×10): qty 12.5

## 2021-08-27 MED ORDER — SODIUM CHLORIDE 3 % IN NEBU
4.0000 mL | INHALATION_SOLUTION | Freq: Two times a day (BID) | RESPIRATORY_TRACT | Status: AC
  Administered 2021-08-27 – 2021-08-29 (×6): 4 mL via RESPIRATORY_TRACT
  Filled 2021-08-27 (×6): qty 4

## 2021-08-27 MED ORDER — PANTOPRAZOLE SODIUM 40 MG PO TBEC
40.0000 mg | DELAYED_RELEASE_TABLET | Freq: Every day | ORAL | Status: DC
Start: 2021-08-27 — End: 2021-08-30
  Administered 2021-08-27 – 2021-08-30 (×4): 40 mg via ORAL
  Filled 2021-08-27 (×4): qty 1

## 2021-08-27 MED ORDER — VANCOMYCIN HCL 1500 MG/300ML IV SOLN
1500.0000 mg | INTRAVENOUS | Status: DC
  Administered 2021-08-27: 1500 mg via INTRAVENOUS
  Filled 2021-08-27: qty 300

## 2021-08-27 MED ORDER — ARFORMOTEROL TARTRATE 15 MCG/2ML IN NEBU
15.0000 ug | INHALATION_SOLUTION | Freq: Two times a day (BID) | RESPIRATORY_TRACT | Status: DC
  Administered 2021-08-27 – 2021-08-30 (×7): 15 ug via RESPIRATORY_TRACT
  Filled 2021-08-27 (×7): qty 2

## 2021-08-27 NOTE — Progress Notes (Signed)
Admission from the ED by bed awake and alert. 

## 2021-08-27 NOTE — Progress Notes (Signed)
Pharmacy Antibiotic Note  [name] is a 51 y.o. male admitted on 08/26/2021 with pneumonia.  Pharmacy has been consulted for vancomycin and Unasyn dosing, now changing Unasyn to cefepime.  Pt has h/o HIV/AIDS and prior PJP; reports current medication adherence including Bactrim Px.  Plan: Continue Vancomycin 1500 mg IV Q24H Cefepime 2gm IV Q8H F/u renal fxn, C&S, clinical status and peak/trough at SS  Height: 5\' 6"  (167.6 cm) Weight: 76.4 kg (168 lb 6.4 oz) IBW/kg (Calculated) : 63.8  Temp (24hrs), Avg:97.8 F (36.6 C), Min:97.6 F (36.4 C), Max:98 F (36.7 C)  Recent Labs  Lab 08/22/21 0547 08/22/21 0730 08/23/21 0747 08/24/21 0345 08/25/21 0319 08/26/21 1659 08/26/21 1903 08/26/21 2147 08/27/21 0107 08/27/21 0356  WBC 7.3  --   --  3.5* 4.5 4.8  --   --  2.6*  --   CREATININE 1.17  --  1.26* 1.32* 1.44* 1.15  --   --  1.27*  --   LATICACIDVEN  --    < >  --   --   --  1.9 2.6* 3.2* 4.4* 2.2*   < > = values in this interval not displayed.     Estimated Creatinine Clearance: 62.8 mL/min (A) (by C-G formula based on SCr of 1.27 mg/dL (H)).    Allergies  Allergen Reactions   Metformin And Related Swelling and Rash    Pt doesn't remember this allergy, but does state he gets rash. Unsure if related to medication.    Thank you for allowing pharmacy to be a part of this patient's care.  Salome Arnt, PharmD, BCPS Clinical Pharmacist Please see AMION for all pharmacy numbers 08/27/2021 7:42 AM

## 2021-08-27 NOTE — Consult Note (Signed)
NAME:  [name], MRN:  [id number], DOB:  [birth year], LOS: 1 ADMISSION DATE:  08/26/2021, CONSULTATION DATE:  08/27/2021  REFERRING MD:  Neena Rhymes, CHIEF COMPLAINT: Shortness of breath  History of Present Illness:  51 year old man who speaks [name], history is obtained by using interpreter. He was recently admitted 6/9 to 6/12 and was found to have influenza A, initially required 6 L of oxygen and discharged on 2 L oxygen and a course of prednisone and Tamiflu.  He continued to have spells of shortness of breath and persistent coughing.  Denies mucus production, fevers or chills. Initially saturation was 66%, improved to 96% with DuoNebs and 5 L nasal cannula Chest x-ray did not show any new infiltrates, extensive bronchiectasis of the left lung was noted.    Pertinent  Medical History  HIV-last CD4 count 106, viral load less than 20, history of PJP COPD -no PFTs available, quit smoking 2015 Pulmonary TB -2000, no details about treatment available DVT/PE 2017 Community-acquired pneumonia right lower lobe February 2023 , bronchoscopy with negative BAL for AFB and fungal   Significant Hospital Events: Including procedures, antibiotic start and stop dates in addition to other pertinent events   CTA chest 6/9 bullous emphysema, chronic bronchiectasis left lower lobe with areas of mucous plugging , pneumonia from February has resolved CT chest without contrast 6/13 chronic bronchiectasis left lower lobe, new air-fluid levels and dilated bronchi, otherwise unchanged  Interim History / Subjective:  Complains of persistent coughing No fevers  Objective   Blood pressure 119/80, pulse 77, temperature 97.6 F (36.4 C), temperature source Oral, resp. rate 15, height _0  (1.676 m), weight 76.4 kg, SpO2 95 %.        Intake/Output Summary (Last 24 hours) at 08/27/2021 1052 Last data filed at 08/27/2021 0305 Gross per 24 hour  Intake 396.62 ml  Output --  Net 396.62 ml    Filed Weights   08/26/21 1600  Weight: 76.4 kg    Examination: General: Adult male sitting up in bed, no distress, with episodes of coughing HENT: Mild pallor, no icterus, no JVD, no lymphadenopathy Lungs: Crackles left lower lobe, diffuse scattered rhonchi, no accessory muscle use Cardiovascular: S1-S2 mild tachycardia, no murmur Abdomen: Soft, nontender, no hepatosplenomegaly Extremities: No edema, clubbing 1+ Neuro: Alert, interactive, nonfocal   Resolved Hospital Problem list     Assessment & Plan:  He recently had influenza infection and now presents with increased bronchospasm and nonproductive cough, hypoxia has improved with nebs.  This likely represents a viral exacerbation of his COPD. 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 He has increased mucus plugging in the left lower lobe and clearly needs better airway clearance  Acute respiratory failure with hypoxia COPD exacerbation  -He has received 1 dose of Solu-Medrol 125, can continue 40 every 12 until bronchospasm resolves -We will use budesonide/Brovana combination while in the hospital with DuoNebs for breakthrough , as outpatient he can start on triple therapy based on insurance coverage -Oxygen requirements have decreased  Left lower lobe bronchiectasis with mucous plugging -We will need a good airway clearance regimen -Use hypertonic saline nebs while in the hospital -Mucinex 600 twice daily -Flutter valve and vest -Okay for empiric antibiotics  He will need outpatient pulmonary clinic appointment has no showed previously  Best Practice (right click and "Reselect all SmartList Selections" daily)  Per primary team Code Status:  full code   Labs   CBC: Recent Labs  Lab 08/22/21 0547 08/24/21 0345 08/25/21 0319 08/26/21 1659  08/26/21 1733 08/27/21 0107  WBC 7.3 3.5* 4.5 4.8  --  2.6*  NEUTROABS  --   --   --  3.8  --  2.3  HGB 14.1 13.4 13.5 13.4 13.3 13.7  HCT 40.6 40.3 40.0 39.8 39.0 41.1  MCV 99.3 99.8 100.3* 99.7  --  102.0*  PLT 175 146* 151 149*  --  630    Basic Metabolic Panel: Recent Labs  Lab 08/23/21 0747 08/24/21 0345 08/25/21 0319 08/26/21 1659 08/26/21 1733 08/27/21 0107  NA 137 137 133* 135 136 134*  K 3.8 4.0 4.1 3.5 3.5 4.4  CL 101 101 102 102  --  99  CO2 _0 --  23  GLUCOSE 108* 122* 144* 256*  --  278*  BUN 15 19 31* 22*  --  19  CREATININE 1.26* 1.32* 1.44* 1.15  --  1.27*  CALCIUM 9.3 9.2 8.9 8.8*  --  9.1   GFR: Estimated Creatinine Clearance: 62.8 mL/min (A) (by C-G formula based on SCr of 1.27 mg/dL (H)). Recent Labs  Lab 08/24/21 0345 08/25/21 0319 08/26/21 1659 08/26/21 1903 08/26/21 2147 08/27/21 0107 08/27/21 0356  WBC 3.5* 4.5 4.8  --   --  2.6*  --   LATICACIDVEN  --   --  1.9 2.6* 3.2* 4.4* 2.2*    Liver Function Tests: Recent Labs  Lab 08/22/21 1029 08/26/21 1659  AST 24 22  ALT 15 16  ALKPHOS 65 50  BILITOT 0.8 0.4  PROT 7.8 7.6  ALBUMIN 3.5 2.9*   No results for input(s): "LIPASE", "AMYLASE" in the last 168 hours. No results for input(s): "AMMONIA" in the last 168 hours.  ABG    Component Value Date/Time   PHART 7.450 04/23/2016 2319   PCO2ART 35.3 04/23/2016 2319   PO2ART 65.0 (L) 04/23/2016 2319   HCO3 28.2 (H) 08/26/2021 1733   TCO2 30 08/26/2021 1733   O2SAT 100 08/26/2021 1733     Coagulation Profile: Recent Labs  Lab 08/22/21 0730  INR 1.1    Cardiac Enzymes: No results for input(s): "CKTOTAL", "CKMB", "CKMBINDEX", "TROPONINI" in the last 168 hours.  HbA1C: Hgb A1c MFr Bld  Date/Time Value Ref Range Status  08/26/2021 09:47 PM 5.9 (H) 4.8 - 5.6 % Final    Comment:    (NOTE) Pre diabetes:          5.7%-6.4%  Diabetes:              >6.4%  Glycemic control for   <7.0% adults with diabetes    11/01/2020 04:56 PM 6.0 (H) 4.8 - 5.6 % Final    Comment:    (NOTE) Pre diabetes:          5.7%-6.4%  Diabetes:              >6.4%  Glycemic control for   <7.0% adults with diabetes     CBG: No results for input(s): "GLUCAP" in the last 168 hours.  Review of Systems:   Shortness of breath Cough Wheezing  Constitutional: negative for anorexia, fevers and sweats  Eyes: negative for irritation, redness and visual disturbance  Ears, nose, mouth, throat, and face: negative for earaches, epistaxis, nasal congestion and sore throat   Cardiovascular: negative for chest pain,  lower extremity edema, orthopnea, palpitations and syncope  Gastrointestinal:  negative for abdominal pain, constipation, diarrhea, melena, nausea and vomiting  Genitourinary:negative for dysuria, frequency and hematuria  Hematologic/lymphatic: negative for bleeding, easy bruising and lymphadenopathy  Musculoskeletal:negative for arthralgias, muscle weakness and stiff joints  Neurological: negative for coordination problems, gait problems, headaches and weakness  Endocrine: negative for diabetic symptoms including polydipsia, polyuria and weight loss   Past Medical History:  He,  has a past medical history of Acute pulmonary embolism ([place]) (11/01/2020), Acute pulmonary embolus ([place]) (02/24/2016), Bronchiectasis with acute exacerbation ([place]), Depression, Diabetes mellitus without complication ([place]), DVT (deep venous thrombosis) ([place]) (11/03/2020), Dyspnea, Genital warts (01/04/2017), Hepatitis, HIV disease ([place]), MSSA bacteremia, 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   Surgical History:   Past Surgical History:  Procedure Laterality Date   BRONCHIAL WASHINGS Bilateral 04/23/2021   Procedure: BRONCHIAL WASHINGS;  Surgeon: Candee Furbish, MD;  Location: [facility] ENDOSCOPY;  Service: Pulmonary;  Laterality:  Bilateral;   FRACTURE SURGERY     IM NAILING TIBIA Right 03/21/2014   ORIF ANKLE FRACTURE Right 03/21/2014   lateral malleolus/notes 03/21/2014   ORIF ANKLE FRACTURE Right 03/21/2014   Procedure: OPEN REDUCTION INTERNAL FIXATION (ORIF) pilon ;  Surgeon: Marybelle Killings, MD;  Location: [place];  Service: Orthopedics;  Laterality: Right;   TEE WITHOUT CARDIOVERSION N/A 05/23/2019   Procedure: TRANSESOPHAGEAL ECHOCARDIOGRAM (TEE);  Surgeon: Skeet Latch, MD;  Location: [place];  Service: Cardiovascular;  Laterality: N/A;   TIBIA IM NAIL INSERTION Right 03/21/2014   Procedure: INTRAMEDULLARY (IM) NAIL TIBIAL;  Surgeon: Marybelle Killings, MD;  Location: [place];  Service: Orthopedics;  Laterality: Right;   VIDEO BRONCHOSCOPY Bilateral 03/02/2016   Procedure: VIDEO BRONCHOSCOPY WITHOUT FLUORO;  Surgeon: Javier Glazier, MD;  Location: [place];  Service: Cardiopulmonary;  Laterality: Bilateral;   VIDEO BRONCHOSCOPY Left 04/23/2021   Procedure: VIDEO BRONCHOSCOPY WITHOUT FLUORO;  Surgeon: Candee Furbish, MD;  Location: [facility] ENDOSCOPY;  Service: Pulmonary;  Laterality: Left;     Social History:   reports that he quit smoking about 20 years ago. His smoking use included cigarettes. He has a 3.00 pack-year smoking history. He has never used smokeless tobacco. He reports that he does not currently use alcohol. He reports that he does not use drugs.   Family History:  His family history includes Heart disease in his sister; Hypertension in an other family member.   Allergies Allergies  Allergen Reactions   Metformin And Related Swelling and Rash    Pt doesn't remember this allergy, but does state he gets rash. Unsure if related to medication.     Home Medications  Prior to Admission medications   Medication Sig Start Date End Date Taking? Authorizing Provider  albuterol (VENTOLIN HFA) 108 (90 Base) MCG/ACT inhaler Inhale 2 puffs into the lungs every 6 (six) hours as needed for wheezing or shortness of  breath. 03/27/21  Yes Prosperi, Christian H, PA-C  Darunavir-Cobicistat-Emtricitabine-Tenofovir Alafenamide (SYMTUZA) 800-150-200-10 MG TABS TAKE 1 TABLET BY MOUTH 1 TIME A DAY WITH BREAKFAST Patient taking differently: Take 1 tablet by mouth daily with breakfast. 08/04/21  Yes Carlyle Basques, MD  dolutegravir (TIVICAY) 50 MG tablet TAKE ONE TABLET BY MOUTH ONCE DAILY. STORE AT ROOM TEMPERATURE Patient taking differently: Take 50 mg by mouth daily. 08/04/21  Yes Carlyle Basques, MD  guaiFENesin (ROBITUSSIN) 100 MG/5ML liquid Take 5 mLs by mouth every 4 (four) hours. Patient taking differently: Take 5 mLs by mouth every  4 (four) hours as needed for cough or to loosen phlegm. 08/04/21  Yes Carlyle Basques, MD  ibuprofen (ADVIL) 200 MG tablet Take 400 mg by mouth every 6 (six) hours as needed for mild pain, fever or headache.   Yes [provider]  OXYGEN Inhale 2 L into the lungs continuous.   Yes [provider]  sulfamethoxazole-trimethoprim (BACTRIM DS) 800-160 MG tablet Take 1 tablet by mouth daily. Patient taking differently: Take 1 tablet by mouth daily. Continuously 08/25/21 11/23/21 Yes Timothy Lasso, MD  oseltamivir (TAMIFLU) 75 MG capsule Take 1 capsule (75 mg total) by mouth 2 (two) times daily for 3 doses. Patient not taking: Reported on 08/26/2021 08/25/21 08/27/21  Timothy Lasso, MD  predniSONE (DELTASONE) 20 MG tablet Take 2 tablets (40 mg total) by mouth daily with breakfast for 1 dose. Patient not taking: Reported on 08/26/2021 08/26/21 08/27/21  Timothy Lasso, MD  Triamcinolone Acetonide (TRIAMCINOLONE 0.1 % CREAM : EUCERIN) CREA Apply 1 application topically 3 (three) times daily as needed. Patient not taking: Reported on 08/22/2021 05/05/21   Gower Callas, NP  triamcinolone ointment (KENALOG) 0.1 % Apply 1 application. topically 2 (two) times daily. Patient not taking: Reported on 08/22/2021 08/04/21   Carlyle Basques, MD     Kara Mead MD. FCCP. [place]  Pulmonary & Critical care Pager : [phone]  If no response to pager , please call [phone] until 7 pm After 7:00 pm call [place]  [phone]   08/27/2021

## 2021-08-27 NOTE — Progress Notes (Signed)
Wallet with cards and some money and car key, offered to  be placed in safe  but refused. Refused to be checked too.

## 2021-08-27 NOTE — Progress Notes (Signed)
Initial Nutrition Assessment  DOCUMENTATION CODES:   Severe malnutrition in context of chronic illness  INTERVENTION:   - Glucerna Shake po TID, each supplement provides 220 kcal and 10 grams of protein  - Encourage PO intake  - Renal MVI daily  - Continue vitamin D supplementation  NUTRITION DIAGNOSIS:   Severe Malnutrition related to chronic illness (COPD, HIV/AIDS, CKD) as evidenced by moderate fat depletion, severe muscle depletion, percent weight loss (11.1% weight loss in 6 months).  GOAL:   Patient will meet greater than or equal to 90% of their needs  MONITOR:   PO intake, Supplement acceptance, Labs, Weight trends  REASON FOR ASSESSMENT:   Consult Assessment of nutrition requirement/status  ASSESSMENT:   51 year old male who presented to the ED on 6/13 with SOB. Pt was discharged from the hospital on 6/12 after being admitted for influenza and COPD exacerbation. 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  Spoke with pt at bedside utilizing [employer] audio interpretor as a video interpretor was not available. Pt reports good appetite and good PO intake. Meal completions have been 70-100%. Pt reports also having a good appetite at home and eating well. Pt unable/unwilling to provide more information regarding PO intake PTA despite RD prompting.  Pt reports that he has not lost any weight recently. He states that is current weight of 165 lbs is his UBW. However, later in conversation when RD completing NFPE, pt reports that his body is slimmer than it usually is "due to this illness."  Reviewed weight history in chart. Pt with a slow, steady decline in weight over the last 6 months. Overall, pt has lost 9.4 kg since 02/27/21. This is an 11.1% weight loss which is significant for timeframe. Pt meets criteria for malnutrition.  Pt willing to try consuming oral nutrition supplements. He does not have a preference. RD to  order renal MVI given CKD stage III. Discussed with pt the importance of increasing kcal and protein intake to promote healing. Pt expresses understanding.  If PO intake were to worsen, recommend liberalizing diet back to Regular diet and changing Glucerna to Ensure. Discussed pt with RN.  Meal Completion: 70-100%  Medications reviewed and include: symtuza, tivicay, IV solu-medrol, protonix, vitamin D 50,000 units q 7 days, IV abx  Labs reviewed: BUN 28, creatinine 1.40, vitamin D 12.22  NUTRITION - FOCUSED PHYSICAL EXAM:  Flowsheet Row Most Recent Value  Orbital Region Moderate depletion  Upper Arm Region Moderate depletion  Thoracic and Lumbar Region Moderate depletion  Buccal Region Moderate depletion  Temple Region Moderate depletion  Clavicle Bone Region Severe depletion  Clavicle and Acromion Bone Region Severe depletion  Scapular Bone Region Moderate depletion  Dorsal Hand Moderate depletion  Patellar Region Moderate depletion  Anterior Thigh Region Moderate depletion  Posterior Calf Region Moderate depletion  Edema (RD Assessment) None  Hair Reviewed  Eyes Reviewed  Mouth Reviewed  Skin Reviewed  Nails Reviewed       Diet Order:   Diet Order             Diet Carb Modified Fluid consistency: Thin; Room service appropriate? Yes  Diet effective now                   EDUCATION NEEDS:   Education needs have been addressed  Skin:  Skin Assessment: Reviewed RN Assessment  Last BM:  08/27/21 colostomy  Height:   Ht Readings from Last 1 Encounters:  08/27/21 5\' 6"  (1.676 m)    Weight:   Wt Readings from Last 1 Encounters:  08/27/21 75 kg    BMI:  Body mass index is 26.69 kg/m.  Estimated Nutritional Needs:   Kcal:  2200-2400  Protein:  110-120 grams  Fluid:  2.0 L    Gustavus Bryant, MS, RD, LDN Inpatient Clinical Dietitian Please see AMiON for contact information.

## 2021-08-27 NOTE — ED Notes (Signed)
Lactic collection time modified d/t it being collected at 0400, next lactic now scheduled for 0700.

## 2021-08-27 NOTE — Plan of Care (Signed)
  Problem: Health Behavior/Discharge Planning: Goal: Ability to manage health-related needs will improve Outcome: Progressing   Problem: Clinical Measurements: Goal: Ability to maintain clinical measurements within normal limits will improve Outcome: Progressing Goal: Diagnostic test results will improve Outcome: Progressing Goal: Respiratory complications will improve Outcome: Progressing   

## 2021-08-27 NOTE — ED Notes (Signed)
Lunch order placed

## 2021-08-27 NOTE — Progress Notes (Addendum)
Subjective: Patient seen and evaluated at bedside.  Dry cough noted throughout interview.  He states that he completed the medication given at discharge 2 days ago.  He had also been using portable home oxygen.  He noticed worsening of his cough and shortness of breath yesterday prompting him to come to the ED. He denies fever, chills, N/V, constipation or diarrhea.  Objective:  Vital signs in last 24 hours: Vitals:   08/27/21 0419 08/27/21 0420 08/27/21 0500 08/27/21 0600  BP:  122/86 126/77 119/80  Pulse:  79 76 77  Resp:  18 18 15   Temp: 97.6 F (36.4 C)     TempSrc: Oral     SpO2:  95% 94% 95%  Weight:      Height:       Physical Exam Constitutional:      Interventions: Nasal cannula in place.  HENT:     Head: Normocephalic and atraumatic.  Cardiovascular:     Heart sounds: Normal heart sounds.  Pulmonary:     Effort: No respiratory distress or retractions.     Breath sounds: Rales present.  Abdominal:     Palpations: Abdomen is soft.     Tenderness: There is no abdominal tenderness.  Musculoskeletal:     Right lower leg: No edema.     Left lower leg: No edema.  Skin:    General: Skin is warm and dry.  Neurological:     Mental Status: He is alert.  Psychiatric:        Behavior: Behavior normal. Behavior is cooperative.      Assessment/Plan:  Active Problems:   Acute respiratory failure with hypoxia ([name])   [name] P Barnaby is a 51 y.o. 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   Acute on chronic hypoxic respiratory failure  Acute on Chronic COPD Acute on Chronic Bronchiectasis  Patient recently diashcarged on 08/25/21 for COPD exacerbation 2/2 influenza A infection. He was sent home with 1 remaining dose of  tamiflu, prednisone and 2L [place] home oxygen. He endorse completion of medication regimen. And noted to have persistent dry cough and SOB prompting ED visit.  On admission, he was noted to be hypoxic, saturating at 66% with improvement on 6 L nasal cannula, saturating to 96%.  Patient has extensive lung hx including chronic bronchiectasis and hx of pulmonary TB. Repeat imaging on this admission, reveals chronic pulmonary changes consistent with lung history. There is concern for bronchiectasis flare resulting in persistent symptoms. Patient started on IV abx with pseudomonas coverage. Patient will need further evaluation with pulmonology.  Per further evaluation, patient has left lower lobe bronchiectasis with mucous plugging. -Strep pneumo urine antigen negative -Legionella antigen pending -Blood culture showed no growth to date -MSRA swab pending -Pulmonology following, recommendations appreciated -Hypertonic saline nebs -Mucinex 600 twice daily -Flutter valve and vest daily -Solu-Medrol 40 every 12 hours until bronchospasm is resolved -Budesonide/Brovana inhaler -Continue IV Unasyn, cefepime and vancomycin -Respiratory culture pending  HIV infection As of 08/25/2021-viral load detectable at 40, CD4 count 104.  Patient follows with our [facility] outpatient.  He has a history of PJP and pulmonary TB. Home medication includes Symtuza, Tivicay, and Bactrim for PJP prophylaxis. -Resume Symtuza, Tivicay and Bactrim  Vitamin D deficiency On admission,  vitamin D levels noted to be low at 12.2. -Vitamin D supplementation   Prior to Admission Living Arrangement: Anticipated Discharge Location: Barriers to Discharge: Dispo: Anticipated discharge in approximately 1-3 day(s).   Dellis Filbert, MD 08/27/2021, 6:54 AM Pager: [phone] After 5pm on weekdays and 1pm on weekends: On Call pager [phone]

## 2021-08-27 NOTE — Progress Notes (Signed)
Pharmacy Antibiotic Note  [name] is a 51 y.o. male admitted on 08/26/2021 with pneumonia.  Pharmacy has been consulted for vancomycin and Unasyn dosing.  Pt has h/o HIV/AIDS and prior PJP; reports current medication adherence including Bactrim Px.  Plan: Vancomycin 1500 mg IV Q24H. Goal AUC 400-550.  Expected AUC 480. Unasyn 3g IV Q6H.  Height: 5\' 6"  (167.6 cm) Weight: 76.4 kg (168 lb 6.4 oz) IBW/kg (Calculated) : 63.8  Temp (24hrs), Avg:98 F (36.7 C), Min:98 F (36.7 C), Max:98 F (36.7 C)  Recent Labs  Lab 08/22/21 0547 08/22/21 0730 08/23/21 0747 08/24/21 0345 08/25/21 0319 08/26/21 1659 08/26/21 1903 08/26/21 2147 08/27/21 0107  WBC 7.3  --   --  3.5* 4.5 4.8  --   --  2.6*  CREATININE 1.17  --  1.26* 1.32* 1.44* 1.15  --   --  1.27*  LATICACIDVEN  --  0.8  --   --   --  1.9 2.6* 3.2* 4.4*    Estimated Creatinine Clearance: 62.8 mL/min (A) (by C-G formula based on SCr of 1.27 mg/dL (H)).    Allergies  Allergen Reactions   Metformin And Related Swelling and Rash    Pt doesn't remember this allergy, but does state he gets rash. Unsure if related to medication.    Thank you for allowing pharmacy to be a part of this patient's care.  08/29/21, PharmD, BCPS  08/27/2021 2:18 AM

## 2021-08-27 NOTE — Consult Note (Signed)
WOC Nurse ostomy follow up Patient receiving care in the ED 038.  The patient is familiar to the [facility] team. I have placed ostomy care instructions for the bedside nurses to follow. Order Dewaine Conger, Hart [place] [phone] (4 of these)  and Oswald Hillock [phone] (4 of these) Helmut Muster, RN, MSN, CWOCN, CNS-BC, pager [phone]

## 2021-08-28 ENCOUNTER — Telehealth: Payer: Self-pay | Admitting: Pulmonary Disease

## 2021-08-28 DIAGNOSIS — J471 Bronchiectasis with (acute) exacerbation: Secondary | ICD-10-CM | POA: Diagnosis not present

## 2021-08-28 DIAGNOSIS — J9601 Acute respiratory failure with hypoxia: Secondary | ICD-10-CM | POA: Diagnosis not present

## 2021-08-28 DIAGNOSIS — J101 Influenza due to other identified influenza virus with other respiratory manifestations: Secondary | ICD-10-CM | POA: Diagnosis not present

## 2021-08-28 DIAGNOSIS — J441 Chronic obstructive pulmonary disease with (acute) exacerbation: Secondary | ICD-10-CM | POA: Diagnosis not present

## 2021-08-28 LAB — CBC WITH DIFFERENTIAL/PLATELET
Abs Immature Granulocytes: 0.02 10*3/uL (ref 0.00–0.07)
Basophils Absolute: 0 10*3/uL (ref 0.0–0.1)
Basophils Relative: 0 %
Eosinophils Absolute: 0 10*3/uL (ref 0.0–0.5)
Eosinophils Relative: 0 %
HCT: 37.9 % — ABNORMAL LOW (ref 39.0–52.0)
Hemoglobin: 12.4 g/dL — ABNORMAL LOW (ref 13.0–17.0)
Immature Granulocytes: 0 %
Lymphocytes Relative: 6 %
Lymphs Abs: 0.5 10*3/uL — ABNORMAL LOW (ref 0.7–4.0)
MCH: 32.5 pg (ref 26.0–34.0)
MCHC: 32.7 g/dL (ref 30.0–36.0)
MCV: 99.5 fL (ref 80.0–100.0)
Monocytes Absolute: 0.4 10*3/uL (ref 0.1–1.0)
Monocytes Relative: 6 %
Neutro Abs: 6.6 10*3/uL (ref 1.7–7.7)
Neutrophils Relative %: 88 %
Platelets: 155 10*3/uL (ref 150–400)
RBC: 3.81 MIL/uL — ABNORMAL LOW (ref 4.22–5.81)
RDW: 12.4 % (ref 11.5–15.5)
WBC: 7.6 10*3/uL (ref 4.0–10.5)
nRBC: 0 % (ref 0.0–0.2)

## 2021-08-28 LAB — BASIC METABOLIC PANEL
Anion gap: 12 (ref 5–15)
Anion gap: 11 (ref 5–15)
BUN: 28 mg/dL — ABNORMAL HIGH (ref 6–20)
BUN: 29 mg/dL — ABNORMAL HIGH (ref 6–20)
CO2: 22 mmol/L (ref 22–32)
CO2: 24 mmol/L (ref 22–32)
Calcium: 9.1 mg/dL (ref 8.9–10.3)
Calcium: 9.2 mg/dL (ref 8.9–10.3)
Chloride: 101 mmol/L (ref 98–111)
Chloride: 101 mmol/L (ref 98–111)
Creatinine, Ser: 1.4 mg/dL — ABNORMAL HIGH (ref 0.61–1.24)
Creatinine, Ser: 1.31 mg/dL — ABNORMAL HIGH (ref 0.61–1.24)
GFR, Estimated: >60 mL/min (ref >60)
GFR, Estimated: >60 mL/min (ref >60)
Glucose, Bld: 234 mg/dL — ABNORMAL HIGH (ref 70–99)
Glucose, Bld: 333 mg/dL — ABNORMAL HIGH (ref 70–99)
Potassium: 4.2 mmol/L (ref 3.5–5.1)
Potassium: 3.9 mmol/L (ref 3.5–5.1)
Sodium: 135 mmol/L (ref 135–145)
Sodium: 136 mmol/L (ref 135–145)

## 2021-08-28 LAB — GLUCOSE, CAPILLARY
Glucose-Capillary: 264 mg/dL — ABNORMAL HIGH (ref 70–99)
Glucose-Capillary: 232 mg/dL — ABNORMAL HIGH (ref 70–99)

## 2021-08-28 LAB — LEGIONELLA PNEUMOPHILA SEROGP 1 UR AG: L. pneumophila Serogp 1 Ur Ag: NEGATIVE

## 2021-08-28 MED ORDER — PREDNISONE 20 MG PO TABS
40.0000 mg | ORAL_TABLET | Freq: Every day | ORAL | Status: DC
  Administered 2021-08-30: 40 mg via ORAL
  Filled 2021-08-28: qty 2

## 2021-08-28 MED ORDER — METHYLPREDNISOLONE SODIUM SUCC 40 MG IJ SOLR
40.0000 mg | Freq: Every day | INTRAMUSCULAR | Status: AC
  Administered 2021-08-29: 40 mg via INTRAVENOUS
  Filled 2021-08-28: qty 1

## 2021-08-28 MED ORDER — GLUCERNA SHAKE PO LIQD
237.0000 mL | Freq: Three times a day (TID) | ORAL | Status: DC
  Administered 2021-08-28 – 2021-08-29 (×4): 237 mL via ORAL

## 2021-08-28 MED ORDER — INSULIN ASPART 100 UNIT/ML IJ SOLN
0.0000 [IU] | Freq: Every day | INTRAMUSCULAR | Status: DC
  Administered 2021-08-28: 2 [IU] via SUBCUTANEOUS
  Administered 2021-08-29: 3 [IU] via SUBCUTANEOUS

## 2021-08-28 MED ORDER — INSULIN ASPART 100 UNIT/ML IJ SOLN
0.0000 [IU] | Freq: Three times a day (TID) | INTRAMUSCULAR | Status: DC
  Administered 2021-08-28: 5 [IU] via SUBCUTANEOUS
  Administered 2021-08-29: 7 [IU] via SUBCUTANEOUS
  Administered 2021-08-29: 2 [IU] via SUBCUTANEOUS
  Administered 2021-08-29: 3 [IU] via SUBCUTANEOUS
  Administered 2021-08-30: 5 [IU] via SUBCUTANEOUS
  Administered 2021-08-30: 2 [IU] via SUBCUTANEOUS

## 2021-08-28 MED ORDER — METHYLPREDNISOLONE SODIUM SUCC 40 MG IJ SOLR: 40.0000 mg | Freq: Every day | INTRAMUSCULAR | Status: DC

## 2021-08-28 MED ORDER — RENA-VITE PO TABS
1.0000 | ORAL_TABLET | Freq: Every day | ORAL | Status: DC
  Administered 2021-08-28 – 2021-08-29 (×2): 1 via ORAL
  Filled 2021-08-28 (×2): qty 1

## 2021-08-28 NOTE — Telephone Encounter (Signed)
Please obtain office appointment with APP in 2 to 3 weeks COPD Influenza Bronchiectasis with exacerbation

## 2021-08-28 NOTE — Progress Notes (Signed)
Patient asleep resting well.  CPT held at this time will resume at 0800.

## 2021-08-28 NOTE — Progress Notes (Addendum)
   Subjective: Patient seen and evaluated at bedside.  He states his breathing continues to improve.  He has now been coughing up yellowish phlegm.  He denies chest pain or shortness of breath.  Objective:  Vital signs in last 24 hours: Vitals:   08/28/21 0300 08/28/21 0726 08/28/21 0801 08/28/21 1040  BP: 136/87  (!) 122/92 136/81  Pulse: 85  81   Resp: 18  16   Temp: 98.2 F (36.8 C)  98.2 F (36.8 C) 98.2 F (36.8 C)  TempSrc: Oral  Oral Oral  SpO2: 91% 92% 94%   Weight:      Height:       Physical Exam Constitutional:      General: He is not in acute distress. HENT:     Head: Normocephalic and atraumatic.  Cardiovascular:     Rate and Rhythm: Normal rate.     Heart sounds: Normal heart sounds.  Pulmonary:     Effort: Pulmonary effort is normal.     Breath sounds: Normal breath sounds and air entry.  Abdominal:     General: Bowel sounds are normal.     Palpations: Abdomen is soft.  Skin:    General: Skin is warm and dry.  Neurological:     General: No focal deficit present.     Mental Status: He is alert.  Psychiatric:        Behavior: Behavior is cooperative.    Assessment/Plan:  Active Problems:   Acute respiratory failure with hypoxia ([name])    [name] is a 51 y.o. 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    Acute on chronic COPD Acute on chronic bronchiectasis Acute on chronic hypoxic respiratory failure, resolving Patient's breathing status continues to improve.  Currently saturating well on 2 L nasal cannula.  He is now experiencing productive cough.  Now should be able to collect expectorated sputum for culture.  MRSA swab negative, vancomycin discontinued. -Continue cefepime 2 g every 8 hours x7  days; day 2 of 7 -Continue hypertonic saline nebs -Mucinex 600 mg twice daily -Flutter valve and vest daily -Solu-Medrol from IV 125mg   to 40mg   daily--> switch to oral 40mg  prednisone and taper over two weeks  -Expectorated sputum culture pending  HIV infection Continue Symtuza, Tivicay and Bactrim for PJP prophylaxis  Vitamin D deficiency -Vitamin D supplement   Prior to Admission Living Arrangement: Anticipated Discharge Location: Barriers to Discharge: Dispo: Anticipated discharge in approximately 1-2 day(s).   , MD 08/28/2021, 1:15 PM Pager: [phone] After 5pm on weekdays and 1pm on weekends: On Call pager [phone]

## 2021-08-28 NOTE — Progress Notes (Addendum)
Inpatient Diabetes Program Recommendations  AACE/ADA: New Consensus Statement on Inpatient Glycemic Control (2015)  Target Ranges:  Prepandial:   less than 140 mg/dL      Peak postprandial:   less than 180 mg/dL (1-2 hours)      Critically ill patients:  140 - 180 mg/dL   Lab Results  Component Value Date   GLUCAP 124 (H) 04/23/2021   HGBA1C 5.9 (H) 08/26/2021    Review of Glycemic Control  Latest Reference Range & Units 08/28/21 00:39 08/28/21 09:15  Glucose 70 - 99 mg/dL 827 (H) 078 (H)  (H): Data is abnormally high Diabetes history: Type 2 Dm Outpatient Diabetes medications: none Current orders for Inpatient glycemic control: none Solumedrol 40 mg QD  Inpatient Diabetes Program Recommendations:    Consider: -Novolog 0-9 units TID & HS -Carb modified diet -Levemir 10 units QD -Novolog 3 units TID (assuming patient is consuming >50% of meals)  Thanks, Lujean Rave, MSN, RNC-OB Diabetes Coordinator [phone] (8a-5p)

## 2021-08-28 NOTE — Progress Notes (Signed)
NAME:  [name], MRN:  [id number], DOB:  09/07/1970, LOS: 2 ADMISSION DATE:  08/26/2021, CONSULTATION DATE:  08/28/2021  REFERRING MD:  Neena Rhymes, CHIEF COMPLAINT: Shortness of breath  History of Present Illness:  51 year old man who speaks [name], history is obtained by using interpreter. He was recently admitted 6/9 to 6/12 and was found to have influenza A, initially required 6 L of oxygen and discharged on 2 L oxygen and a course of prednisone and Tamiflu.  He continued to have spells of shortness of breath and persistent coughing.  Denies mucus production, fevers or chills. Initially saturation was 66%, improved to 96% with DuoNebs and 5 L nasal cannula Chest x-ray did not show any new infiltrates, extensive bronchiectasis of the left lung was noted.    Pertinent  Medical History  HIV-last CD4 count 106, viral load less than 20, history of PJP COPD -no PFTs available, quit smoking 2015 Pulmonary TB -2000, no details about treatment available DVT/PE 2017 Community-acquired pneumonia right lower lobe February 2023 , bronchoscopy with negative BAL for AFB and fungal   Significant Hospital Events: Including procedures, antibiotic start and stop dates in addition to other pertinent events   CTA chest 6/9 bullous emphysema, chronic bronchiectasis left lower lobe with areas of mucous plugging , pneumonia from February has resolved CT chest without contrast 6/13 chronic bronchiectasis left lower lobe, new air-fluid levels and dilated bronchi, otherwise unchanged  Interim History / Subjective:   Feels better Coughing is decreased Shows me green sputum in a cup  Objective   Blood pressure 136/81, pulse 81, temperature 98.2 F (36.8 C), temperature source Oral, resp. rate 16, height _0  (1.676 m), weight 75 kg, SpO2 94 %.        Intake/Output Summary (Last 24 hours) at 08/28/2021 1154 Last data filed at 08/28/2021 1041 Gross per 24 hour  Intake 1010 ml  Output 1625  ml  Net -615 ml    Filed Weights   08/26/21 1600 08/27/21 1712  Weight: 76.4 kg 75 kg    Examination: General: Adult male sitting up in bed, no distress, with episodes of coughing HENT: Mild pallor, no icterus, no JVD, no lymphadenopathy Lungs: No accessory muscle use, decreased rhonchi, crackles left base Cardiovascular: S1-S2 regular, no murmur Abdomen: Soft, nontender, no hepatosplenomegaly Extremities: No edema, clubbing 1+ Neuro: Alert, interactive, nonfocal  Chest x-ray dependently reviewed which shows evidence of left lower lobe bronchiectasis, no definite airspace disease  Resolved Hospital Problem list     Assessment & Plan:  He recently had influenza infection and now presents with increased bronchospasm and nonproductive cough, hypoxia has improved with nebs.  This likely represents a viral exacerbation of his COPD. 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 He had increased mucus plugging in the left lower lobe   -Much improved and bronchospasm resolved 6/15  Acute respiratory failure with hypoxia COPD exacerbation  -Decrease Solu-Medrol 40 daily and can switch to 40 mg prednisone tomorrow and taper over 2 weeks -Use  budesonide/Brovana combination while in the hospital with DuoNebs for breakthrough , as outpatient he can start on triple therapy based on insurance coverage ( trelegy Judithann Sauger or symbicort + spiriva)  -Oxygen requirements have decreased  Left lower lobe bronchiectasis with mucous plugging -Emphasize good airway clearance regimen on discharge -Use hypertonic saline nebs while in the hospital -Mucinex  600 twice daily -Flutter valve  -Okay for empiric antibiotics, complete a 7-day course -Obtain sputum culture for completion now that he is able to expectorate  Outpatient pulmonary  office appointment will be made. He has no showed previously PCCM available as needed  Best Practice (right click and "Reselect all SmartList Selections" daily)   Per primary team Code Status:  full code   Labs   CBC: Recent Labs  Lab 08/24/21 0345 08/25/21 0319 08/26/21 1659 08/26/21 1733 08/27/21 0107 08/28/21 0039  WBC 3.5* 4.5 4.8  --  2.6* 7.6  NEUTROABS  --   --  3.8  --  2.3 6.6  HGB 13.4 13.5 13.4 13.3 13.7 12.4*  HCT 40.3 40.0 39.8 39.0 41.1 37.9*  MCV 99.8 100.3* 99.7  --  102.0* 99.5  PLT 146* 151 149*  --  156 155     Basic Metabolic Panel: Recent Labs  Lab 08/25/21 0319 08/26/21 1659 08/26/21 1733 08/27/21 0107 08/28/21 0039 08/28/21 0915  NA 133* 135 136 134* 135 136  K 4.1 3.5 3.5 4.4 4.2 3.9  CL 102 102  --  99 101 101  CO2 24 26  --  _0 GLUCOSE 144* 256*  --  278* 234* 333*  BUN 31* 22*  --  19 28* 29*  CREATININE 1.44* 1.15  --  1.27* 1.40* 1.31*  CALCIUM 8.9 8.8*  --  9.1 9.1 9.2    GFR: Estimated Creatinine Clearance: 60.9 mL/min (A) (by C-G formula based on SCr of 1.31 mg/dL (H)). Recent Labs  Lab 08/25/21 0319 08/26/21 1659 08/26/21 1903 08/26/21 2147 08/27/21 0107 08/27/21 0356 08/27/21 1235 08/28/21 0039  WBC 4.5 4.8  --   --  2.6*  --   --  7.6  LATICACIDVEN  --  1.9   < > 3.2* 4.4* 2.2* 1.8  --    < > = values in this interval not displayed.     Liver Function Tests: Recent Labs  Lab 08/22/21 1029 08/26/21 1659  AST 24 22  ALT 15 16  ALKPHOS 65 50  BILITOT 0.8 0.4  PROT 7.8 7.6  ALBUMIN 3.5 2.9*    No results for input(s): "LIPASE", "AMYLASE" in the last 168 hours. No results for input(s): "AMMONIA" in the last 168 hours.  ABG    Component Value Date/Time   PHART 7.450 04/23/2016 2319   PCO2ART 35.3 04/23/2016 2319   PO2ART 65.0 (L) 04/23/2016 2319   HCO3 28.2 (H) 08/26/2021 1733   TCO2 30 08/26/2021 1733   O2SAT 100 08/26/2021 1733     Coagulation Profile: Recent Labs  Lab 08/22/21 0730   INR 1.1     Cardiac Enzymes: No results for input(s): "CKTOTAL", "CKMB", "CKMBINDEX", "TROPONINI" in the last 168 hours.  HbA1C: Hgb A1c MFr Bld  Date/Time Value Ref Range Status  08/26/2021 09:47 PM 5.9 (H) 4.8 - 5.6 % Final    Comment:    (NOTE) Pre diabetes:          5.7%-6.4%  Diabetes:              >6.4%  Glycemic control for   <7.0% adults with diabetes   11/01/2020 04:56 PM 6.0 (H) 4.8 - 5.6 % Final    Comment:    (NOTE) Pre diabetes:          5.7%-6.4%  Diabetes:              >6.4%  Glycemic control for   <7.0%  adults with diabetes     CBG: No results for input(s): "GLUCAP" in the last 168 hours.  Kara Mead MD. Shade Flood. [facility] Pager : [phone]  If no response to pager , please call [phone] until 7 pm After 7:00 pm call Elink  [phone]   08/28/2021

## 2021-08-28 NOTE — Plan of Care (Signed)

## 2021-08-29 ENCOUNTER — Other Ambulatory Visit (HOSPITAL_COMMUNITY): Payer: Self-pay

## 2021-08-29 DIAGNOSIS — J441 Chronic obstructive pulmonary disease with (acute) exacerbation: Secondary | ICD-10-CM | POA: Diagnosis not present

## 2021-08-29 DIAGNOSIS — Z87891 Personal history of nicotine dependence: Secondary | ICD-10-CM

## 2021-08-29 DIAGNOSIS — J479 Bronchiectasis, uncomplicated: Secondary | ICD-10-CM | POA: Diagnosis not present

## 2021-08-29 DIAGNOSIS — J9601 Acute respiratory failure with hypoxia: Secondary | ICD-10-CM | POA: Diagnosis not present

## 2021-08-29 DIAGNOSIS — B20 Human immunodeficiency virus [HIV] disease: Secondary | ICD-10-CM

## 2021-08-29 LAB — BASIC METABOLIC PANEL
Anion gap: 10 (ref 5–15)
BUN: 33 mg/dL — ABNORMAL HIGH (ref 6–20)
CO2: 28 mmol/L (ref 22–32)
Calcium: 9.9 mg/dL (ref 8.9–10.3)
Chloride: 99 mmol/L (ref 98–111)
Creatinine, Ser: 1.13 mg/dL (ref 0.61–1.24)
GFR, Estimated: >60 mL/min (ref >60)
Glucose, Bld: 181 mg/dL — ABNORMAL HIGH (ref 70–99)
Potassium: 3.7 mmol/L (ref 3.5–5.1)
Sodium: 137 mmol/L (ref 135–145)

## 2021-08-29 LAB — GLUCOSE, CAPILLARY
Glucose-Capillary: 183 mg/dL — ABNORMAL HIGH (ref 70–99)
Glucose-Capillary: 229 mg/dL — ABNORMAL HIGH (ref 70–99)
Glucose-Capillary: 303 mg/dL — ABNORMAL HIGH (ref 70–99)
Glucose-Capillary: 284 mg/dL — ABNORMAL HIGH (ref 70–99)

## 2021-08-29 MED ORDER — PREDNISONE 10 MG PO TABS
10.0000 mg | ORAL_TABLET | Freq: Every day | ORAL | 0 refills | Status: DC
  Filled 2021-08-29: qty 37, 14d supply, fill #0

## 2021-08-29 MED ORDER — SPIRIVA RESPIMAT 2.5 MCG/ACT IN AERS
2.0000 | INHALATION_SPRAY | Freq: Every day | RESPIRATORY_TRACT | 0 refills | Status: DC
  Filled 2021-08-29: qty 4, 30d supply, fill #0

## 2021-08-29 MED ORDER — BUDESONIDE-FORMOTEROL FUMARATE 160-4.5 MCG/ACT IN AERO
2.0000 | INHALATION_SPRAY | Freq: Two times a day (BID) | RESPIRATORY_TRACT | 0 refills | Status: DC
  Filled 2021-08-29: qty 10.2, 30d supply, fill #0

## 2021-08-29 MED ORDER — TRELEGY ELLIPTA 100-62.5-25 MCG/ACT IN AEPB
1.0000 | INHALATION_SPRAY | Freq: Every day | RESPIRATORY_TRACT | 0 refills | Status: DC
  Filled 2021-08-29: qty 60, fill #0

## 2021-08-29 MED ORDER — LEVOFLOXACIN 750 MG PO TABS
750.0000 mg | ORAL_TABLET | Freq: Every day | ORAL | 0 refills | Status: AC
  Filled 2021-08-29: qty 4, 4d supply, fill #0

## 2021-08-29 NOTE — Plan of Care (Signed)

## 2021-08-29 NOTE — Progress Notes (Signed)
Mobility Specialist: Progress Note   08/29/21 1130  Mobility  Activity Ambulated independently in hallway  Level of Assistance Independent  Assistive Device None  Distance Ambulated (ft) 740 ft  Activity Response Tolerated well  $Mobility charge 1 Mobility   Pre-Mobility: 81 HR, 92% SpO2 During Mobility: 96%SpO2 Post-Mobility: 91 HR, 93% SpO2  Pt received in the bed and agreeable to ambulation. Ambulated on 3 L/min [place]. No c/o throughout. Pt back to bed after session with call bell and phone in reach.   [facility] [name] Mobility Specialist Mobility Specialist [address]: [phone]

## 2021-08-29 NOTE — Progress Notes (Signed)
SATURATION QUALIFICATIONS: (This note is used to comply with regulatory documentation for home oxygen)  Patient Saturations on Room Air at Rest = 87%  Patient Saturations on Room Air while Ambulating = 83%  Patient Saturations on 3 Liters of oxygen while Ambulating = 96%

## 2021-08-29 NOTE — Telephone Encounter (Signed)
Patient scheduled for HFU on 09/18/2021 at 11:30am with Rhunette Croft, NP. Appointment reminder mailed to address on file- nothing further needed.

## 2021-08-29 NOTE — Progress Notes (Signed)
HD#3 Subjective:  Overnight Events: No events  Patient is seen at bedside.  [name] is not in any acute respiratory distress.  Breathing comfortable on 3 L.  [name] has been coughing up sputum that was more clear.  Objective:  Vital signs in last 24 hours: Vitals:   08/29/21 0726 08/29/21 0727 08/29/21 0740 08/29/21 1100  BP:   128/86   Pulse:   83   Resp:   20   Temp:    98.2 F (36.8 C)  TempSrc:    Oral  SpO2: 93% 93% 96%   Weight:      Height:       Supplemental O2: Nasal Cannula SpO2: 96 % O2 Flow Rate (L/min): 3 L/min   Physical Exam:  Physical Exam Constitutional:      General: [name] is not in acute distress. HENT:     Head: Normocephalic.  Eyes:     General:        Right eye: No discharge.        Left eye: No discharge.     Conjunctiva/sclera: Conjunctivae normal.  Cardiovascular:     Rate and Rhythm: Normal rate and regular rhythm.     Heart sounds: Normal heart sounds.  Pulmonary:     Effort: Pulmonary effort is normal. No respiratory distress.     Breath sounds: Normal breath sounds.  Abdominal:     General: Bowel sounds are normal.     Palpations: Abdomen is soft.  Musculoskeletal:        General: No swelling. Normal range of motion.  Skin:    General: Skin is warm.  Neurological:     General: No focal deficit present.     Mental Status: [name] is alert.  Psychiatric:        Mood and Affect: Mood normal.     Filed Weights   08/26/21 1600 08/27/21 1712  Weight: 76.4 kg 75 kg     Intake/Output Summary (Last 24 hours) at 08/29/2021 1716 Last data filed at 08/29/2021 1200 Gross per 24 hour  Intake 400 ml  Output 1300 ml  Net -900 ml   Net IO Since Admission: -339.63 mL [08/29/21 1716]  Pertinent Labs:    Latest Ref Rng & Units 08/28/2021   12:39 AM 08/27/2021    1:07 AM 08/26/2021    5:33 PM  CBC  WBC 4.0 - 10.5 K/uL 7.6  2.6    Hemoglobin 13.0 - 17.0 g/dL 25.9  56.3  87.5   Hematocrit 39.0 - 52.0 % 37.9  41.1  39.0   Platelets 150 - 400 K/uL  155  156         Latest Ref Rng & Units 08/29/2021    7:03 AM 08/28/2021    9:15 AM 08/28/2021   12:39 AM  CMP  Glucose 70 - 99 mg/dL 643  329  518   BUN 6 - 20 mg/dL 33  29  28   Creatinine 0.61 - 1.24 mg/dL 8.41  6.60  6.30   Sodium 135 - 145 mmol/L 137  136  135   Potassium 3.5 - 5.1 mmol/L 3.7  3.9  4.2   Chloride 98 - 111 mmol/L 99  101  101   CO2 22 - 32 mmol/L 28  24  22    Calcium 8.9 - 10.3 mg/dL 9.9  9.2  9.1     Imaging: No results found.  Assessment/Plan:   Active Problems:   HIV disease ([name])   Acute  respiratory failure with hypoxia ([name])   Bronchiectasis without acute exacerbation [facility])   Patient Summary: [name] is a 51 y.o. 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  [name] is not comfortable going home today.  [name] would like to be discharged tomorrow morning. -Continue cefepime 2 g every 8 hours  (3 of 7).  Will switch to Levaquin p.o. when discharge -Continue hypertonic saline nebs, flutter valve, vest -Mucinex 600 mg twice daily -Bronchodilators with ICS and LABA.  [name] will be going home with Symbicort and Spiriva Respimat -40 mg Solu-Medrol today and transition to oral 40mg  prednisone and taper over two weeks  -Expectorated sputum culture pending  Steroid-induced hyperglycemia Continue sliding scale for now.  [name] will be switched to p.o. steroids tomorrow.   HIV infection Continue Symtuza, Tivicay and Bactrim for PJP prophylaxis   Vitamin D deficiency -Vitamin D supplement   Carb modified diet Full code IVF: N/A DVT: Lovenox  Prior to Admission  Living Arrangement: Home Anticipated Discharge Location: Home Barriers to Discharge: Oxygen requirement Dispo: Anticipated discharge in approximately 1 day.   , DO 08/29/2021, 5:16 PM Pager: [phone]  Please contact the on call pager after 5 pm and on weekends at [phone].

## 2021-08-29 NOTE — TOC Benefit Eligibility Note (Signed)
Patient Product/process development scientist completed.    The patient is currently admitted and upon discharge could be taking Flovent HFA 220 mcg/act.  The current 30 day co-pay is, $120.00.   The patient is currently admitted and upon discharge could be taking Spirivia Inhaler  The current 30 day co-pay is, $120.00.   The patient is currently admitted and upon discharge could be taking Striverdi Respimat   The current 30 day co-pay is, $73.08.   The patient is insured through [employer]    Roland Earl, CPhT Pharmacy Patient Advocate Specialist [facility] Patient Advocate Team Direct Number: [phone]  Fax: [phone]

## 2021-08-29 NOTE — Progress Notes (Signed)
Inpatient Diabetes Program Recommendations  AACE/ADA: New Consensus Statement on Inpatient Glycemic Control (2015)  Target Ranges:  Prepandial:   less than 140 mg/dL      Peak postprandial:   less than 180 mg/dL (1-2 hours)      Critically ill patients:  140 - 180 mg/dL   Lab Results  Component Value Date   GLUCAP 183 (H) 08/29/2021   HGBA1C 5.9 (H) 08/26/2021    Review of Glycemic Control  Latest Reference Range & Units 08/28/21 16:18 08/28/21 21:01 08/29/21 06:34  Glucose-Capillary 70 - 99 mg/dL 165 (H) 790 (H) 383 (H)  (H): Data is abnormally high Diabetes history: Type 2 Dm Outpatient Diabetes medications: none Current orders for Inpatient glycemic control: Novolog 0-9 units TID & HS Solumedrol 40 mg QD to taper to Prednisone 40 mg QA   Inpatient Diabetes Program Recommendations:     In the setting of steroids consider: -Levemir 10 units QD -Novolog 3 units TID (assuming patient is consuming >50% of meals)  Thanks, Lujean Rave, MSN, RNC-OB Diabetes Coordinator [phone] (8a-5p)

## 2021-08-29 NOTE — TOC Progression Note (Signed)
Transition of Care [facility]) - Progression Note    Patient Details  Name: [name] MRN: [id number] Date of Birth: [birth year]  Transition of Care [facility]) CM/SW Contact  Beckie Busing, RN Phone Number:[phone]  08/29/2021, 2:01 PM  Clinical Narrative:    TOC following patient admitted from home presenting with cough and shortness of breath. Currently there are TOC needs.  Transition of Care (TOC) Screening Note   Patient Details  Name: [name] Date of Birth: 1970-11-17   Transition of Care [facility]) CM/SW Contact:    Beckie Busing, RN Phone Number: 08/29/2021, 2:02 PM    Transition of Care Department [facility]) has reviewed patient and no TOC needs have been identified at this time. We will continue to monitor patient advancement through interdisciplinary progression rounds.           Expected Discharge Plan and Services                                                 Social Determinants of Health (SDOH) Interventions    Readmission Risk Interventions     No data to display

## 2021-08-30 LAB — GLUCOSE, CAPILLARY
Glucose-Capillary: 195 mg/dL — ABNORMAL HIGH (ref 70–99)
Glucose-Capillary: 292 mg/dL — ABNORMAL HIGH (ref 70–99)

## 2021-08-30 MED ORDER — VITAMIN D (ERGOCALCIFEROL) 1.25 MG (50000 UNIT) PO CAPS: 50000.0000 [IU] | ORAL_CAPSULE | ORAL | 0 refills | Status: DC

## 2021-08-30 NOTE — Discharge Summary (Signed)
Name: [name] MRN: [id number] DOB: 03/23/1970 51 y.o. PCP: Pcp, No  Date of Admission: 08/26/2021  3:38 PM Date of Discharge: 08/30/2021 Attending Physician: Charise Killian, MD  Discharge Diagnosis: 1.  COPD exacerbation 2.  Bronchiectasis exacerbation 3.  Influenza A infection 4.  AIDS 5.  Steroid-induced hyperglycemia 6.  Vitamin D deficiency   Discharge Medications: Allergies as of 08/30/2021       Reactions   Metformin And Related Swelling, Rash   Pt doesn't remember this allergy, but does state he gets rash. Unsure if related to medication.        Medication List     STOP taking these medications    oseltamivir 75 MG capsule Commonly known as: TAMIFLU       TAKE these medications    albuterol 108 (90 Base) MCG/ACT inhaler Commonly known as: VENTOLIN HFA Inhale 2 puffs into the lungs every 6 (six) hours as needed for wheezing or shortness of breath.   budesonide-formoterol 160-4.5 MCG/ACT inhaler Commonly known as: Symbicort Inhale 2 puffs into the lungs 2 (two) times daily.   guaiFENesin 100 MG/5ML liquid Commonly known as: ROBITUSSIN Take 5 mLs by mouth every 4 (four) hours. What changed:  when to take this reasons to take this   ibuprofen 200 MG tablet Commonly known as: ADVIL Take 400 mg by mouth every 6 (six) hours as needed for mild pain, fever or headache.   levofloxacin 750 MG tablet Commonly known as: Levaquin Take 1 tablet (750 mg total) by mouth daily for 4 days.   OXYGEN Inhale 2 L into the lungs continuous.   predniSONE 10 MG tablet Commonly known as: DELTASONE Take 4 tabs (40mg ) by mouth daily for 4 days, then 3 tabs daily for 4 days then 2 tabs daily for 3 days then 1 tab daily for 3 days What changed:  medication strength how much to take when to take this   Spiriva Respimat 2.5 MCG/ACT Aers Generic drug: Tiotropium Bromide Monohydrate Inhale 2 puffs into the lungs daily.   sulfamethoxazole-trimethoprim 800-160  MG tablet Commonly known as: BACTRIM DS Take 1 tablet by mouth daily. What changed: additional instructions   Symtuza 800-150-200-10 MG Tabs Generic drug: Darunavir-Cobicistat-Emtricitabine-Tenofovir Alafenamide TAKE 1 TABLET BY MOUTH 1 TIME A DAY WITH BREAKFAST What changed:  how much to take how to take this when to take this additional instructions   Tivicay 50 MG tablet Generic drug: dolutegravir TAKE ONE TABLET BY MOUTH ONCE DAILY. STORE AT ROOM TEMPERATURE What changed:  how much to take how to take this when to take this additional instructions   triamcinolone 0.1 % cream : eucerin Crea Apply 1 application topically 3 (three) times daily as needed.   triamcinolone ointment 0.1 % Commonly known as: KENALOG Apply 1 application. topically 2 (two) times daily.   Vitamin D (Ergocalciferol) 1.25 MG (50000 UNIT) Caps capsule Commonly known as: DRISDOL Take 1 capsule (50,000 Units total) by mouth every 7 (seven) days. Start taking on: September 03, 2021        Disposition and follow-up:   Mr.[name] P Goettl was discharged from [facility] in Stable condition.  At the hospital follow up visit please address:  1.    COPD/bronchiectasis exacerbation Patient will be discharged with: -4 more days of Levaquin to finish 7 days course -Prednisone 40 mg daily taper over 2 weeks -Symbicort and Spiriva Patient was discharged on 3 L nasal cannula.  Please reassess his oxygen requirement at follow-up  Please make sure patient follow-up with pulmonology in 2-3 weeks. Follow-up sputum culture  HIV/AIDS Patient has good follow-up with ID clinic -Last CD4 count 104  Vitamin D deficiency -Started on vitamin D 50,000 units weekly  Steroid-induced hyperglycemia Recheck his blood sugar glucose at follow-up  2.  Labs / imaging needed at time of follow-up: CBC, BMP  3.  Pending labs/ test needing follow-up: Sputum culture  Follow-up Appointments:  -Follow-up  with [facility] in 1-2 weeks -Follow-up with [place] pulmonology in 2 to 3 weeks  Hospital Course by problem list:  51 year old male with past medical history of COPD, HIV, AIDS, chronic bronchiectasis, prior PE not on anticoagulation (details unclear), hx of prior PJP PNA and TB s/p treatment and consolidative RLL PNA in February, who presents to the hospital for worsening shortness of breath.  He was discharged from our service 2 days prior from treatment of COPD exacerbation in setting influenza A.  He finished 4 days of prednisone and Tamiflu from last admission.  He was discharged on 2 L nasal cannula.  On his readmission, he was found to be in respiratory distress and hypoxic to 66% requiring 5 L nasal cannula.  CT chest on this admission showed chronic bronchiectasis but new air-fluid level throughout dilated bronchi worry for new infection.  Otherwise no new consolidation.  Patient has history of bronchiectasis and had a bronchoscopy in February/2023 with negative culture.  He was started on IV vancomycin and cefepime for empiric treatment of bronchiectasis flare.  Vancomycin was discontinue after negative nasal MRSA.  Pulmonology was consulted given his worsening respiratory status.  They recommended high-dose IV steroids and added bronchodilators to his regimen.    His respiratory improved over the next few days and satting well on 3 L.  His steroid was tapered down to oral prednisone 40 mg daily and will be tapered over the next 2 weeks.  Patient received 3 days of cefepime inpatient and will continue 4 more days of Levaquin after discharge.  He was discharged on Symbicort and Spiriva Respimat.  Patient will follow-up with pulmonology in 2 to 3 weeks.  Ambulatory pulse ox on discharge showed that patient is ambulating well with 3 L liters nasal cannula.   HPI Patient appears comfortable and not in any acute respiratory distress.  He denies shortness of breath.  He feels well and ready to go  home.  Discharge Exam:   BP 113/75 (BP Location: Right Arm)   Pulse 80   Temp 98.1 F (36.7 C) (Oral)   Resp 19   Ht 5\' 6"  (1.676 m)   Wt 75 kg   SpO2 93%   BMI 26.69 kg/m  Discharge exam:   Physical Exam Constitutional:      General: He is not in acute distress. HENT:     Head: Normocephalic.  Eyes:     General:        Right eye: No discharge.        Left eye: No discharge.     Conjunctiva/sclera: Conjunctivae normal.  Cardiovascular:     Rate and Rhythm: Normal rate and regular rhythm.  Pulmonary:     Effort: Pulmonary effort is normal. No respiratory distress.     Breath sounds: Normal breath sounds. No wheezing.  Abdominal:     Palpations: Abdomen is soft.  Skin:    General: Skin is warm.  Neurological:     General: No focal deficit present.     Mental Status: He is alert.  Psychiatric:  Mood and Affect: Mood normal.        Behavior: Behavior normal.      Pertinent Labs, Studies, and Procedures:     Latest Ref Rng & Units 08/28/2021   12:39 AM 08/27/2021    1:07 AM 08/26/2021    5:33 PM  CBC  WBC 4.0 - 10.5 K/uL 7.6  2.6    Hemoglobin 13.0 - 17.0 g/dL 09.2  33.0  07.6   Hematocrit 39.0 - 52.0 % 37.9  41.1  39.0   Platelets 150 - 400 K/uL 155  156         Latest Ref Rng & Units 08/29/2021    7:03 AM 08/28/2021    9:15 AM 08/28/2021   12:39 AM  CMP  Glucose 70 - 99 mg/dL 226  333  545   BUN 6 - 20 mg/dL 33  29  28   Creatinine 0.61 - 1.24 mg/dL 6.25  6.38  9.37   Sodium 135 - 145 mmol/L 137  136  135   Potassium 3.5 - 5.1 mmol/L 3.7  3.9  4.2   Chloride 98 - 111 mmol/L 99  101  101   CO2 22 - 32 mmol/L 28  24  22    Calcium 8.9 - 10.3 mg/dL 9.9  9.2  9.1     CT CHEST WO CONTRAST  Result Date: 08/26/2021 CLINICAL DATA:  Immunocompromise. Acute on chronic respiratory failure. EXAM: CT CHEST WITHOUT CONTRAST TECHNIQUE: Multidetector CT imaging of the chest was performed following the standard protocol without IV contrast. RADIATION DOSE REDUCTION:  This exam was performed according to the departmental dose-optimization program which includes automated exposure control, adjustment of the mA and/or kV according to patient size and/or use of iterative reconstruction technique. COMPARISON:  CT angiogram chest 08/22/2021. FINDINGS: Cardiovascular: No significant vascular findings. Normal heart size. No pericardial effusion. Mediastinum/Nodes: There is a small hiatal hernia. Esophagus is nondilated. There are no enlarged mediastinal or hilar lymph nodes. Visualized thyroid gland is within normal limits. Lungs/Pleura: Severe emphysematous changes are again seen. Pleural-parenchymal scarring in the upper lungs appears stable. Focal density in the left upper lobe measuring 1.3 x 1.5 cm image 8/64 appears unchanged. Cluster of slightly nodular airspace opacities in the superior segment of the left lower lobe image 8/78 also appears unchanged. Again seen is bronchiectasis throughout the left lower lobe. There are increasing air-fluid levels throughout dilated bronchi throughout the left lower lobe. There is bibasilar atelectasis or scarring. There is no lung consolidation, pleural effusion or pneumothorax. Upper Abdomen: No acute abnormality. Musculoskeletal: No chest wall mass or suspicious bone lesions identified. IMPRESSION: 1. Chronic bronchiectasis in the left lower lobe is again seen, but there are new air-fluid levels throughout dilated bronchi in this region worrisome for infection. There is no lung consolidation. 2. Otherwise stable appearance of chronic lung disease and emphysema. Electronically Signed   By: 9/78 M.D.   On: 08/26/2021 22:23   DG Chest Port 1 View  Result Date: 08/26/2021 CLINICAL DATA:  Shortness of breath. EXAM: PORTABLE CHEST 1 VIEW COMPARISON:  AP chest 08/22/2021, 06/04/2021, 04/16/2021, 03/27/2021; CT chest 08/22/2021 FINDINGS: Heart size is again at the upper limits of normal. Mediastinal contours are within normal limits.  There is left lower lobe greater than left upper lobe and right lower lobe bronchiectasis with associated scarring better seen on prior CT. The left mid and lower lung dominant scarring appears unchanged from multiple prior radiographs. There is again flattening of the diaphragms and  increased lucent emphysematous change. No definite increased airspace density compared to baseline radiographs. No definite pleural effusion. No acute skeletal abnormality. IMPRESSION: 1. Redemonstration of extensive left mid and lower lung bronchiectasis and scarring. 2. No definite acute airspace opacity is seen over baseline radiographs to indicate an acute lung process. 3. Advanced COPD/emphysema. Electronically Signed   By: Neita Garnet M.D.   On: 08/26/2021 16:26     Discharge Instructions: Discharge Instructions     Call MD for:  difficulty breathing, headache or visual disturbances   Complete by: As directed    Call MD for:  redness, tenderness, or signs of infection (pain, swelling, redness, odor or green/yellow discharge around incision site)   Complete by: As directed    Call MD for:  temperature >100.4   Complete by: As directed    Diet - low sodium heart healthy   Complete by: As directed    Discharge instructions   Complete by: As directed    Mr. Nodarse,  It was a pleasure taking care of you during this admission.  You were hospitalized for worsening shortness of breath from COPD and bronchiectasis flare.  Please continue taking antibiotic (Levaquin) and prednisone after you leave.  The instructions are written on the bottle.  Please start using inhalers after discharge.  You will have 2 different types of inhalers (Spiriva Respimat and Symbicort ) that you will need to use every day.  Please use 3 L nasal cannula oxygen instead of 2L.  The pulmonology office will help making a follow-up appointment with them in 2 weeks.  I also started you on vitamin D supplement.  Please take 50,000 unit once  a week starting on 09/03/2021.  I can set up an appointment with the internal medicine clinic at [facility] for primary care visit after discharge.   Take care,  Dr. Cyndie Chime   Increase activity slowly   Complete by: As directed        Signed: Doran Stabler, DO 08/30/2021, 11:08 AM   Pager: [phone]

## 2021-08-31 LAB — CULTURE, BLOOD (ROUTINE X 2)
Culture: NO GROWTH
Culture: NO GROWTH
Special Requests: ADEQUATE

## 2021-09-02 ENCOUNTER — Telehealth: Payer: Self-pay

## 2021-09-02 NOTE — Telephone Encounter (Signed)
Patient came by to have return to work form filled out. Advised him (using interp. tablet)to take to Internal Med appt 09/03/21 10:45 AM with Dr Montez Morita. Also printed out the address and appointment information and wrote reminder for 09/11/21 11 AM appt at [facility] with [place].

## 2021-09-03 ENCOUNTER — Ambulatory Visit (INDEPENDENT_AMBULATORY_CARE_PROVIDER_SITE_OTHER): Payer: BC Managed Care – PPO | Admitting: Student

## 2021-09-03 ENCOUNTER — Other Ambulatory Visit (HOSPITAL_COMMUNITY): Payer: Self-pay

## 2021-09-03 VITALS — BP 134/85 | HR 115 | Temp 98.4°F | Wt 169.8 lb

## 2021-09-03 DIAGNOSIS — I5022 Chronic systolic (congestive) heart failure: Secondary | ICD-10-CM | POA: Diagnosis not present

## 2021-09-03 DIAGNOSIS — N182 Chronic kidney disease, stage 2 (mild): Secondary | ICD-10-CM

## 2021-09-03 DIAGNOSIS — T380X5A Adverse effect of glucocorticoids and synthetic analogues, initial encounter: Secondary | ICD-10-CM

## 2021-09-03 DIAGNOSIS — B20 Human immunodeficiency virus [HIV] disease: Secondary | ICD-10-CM

## 2021-09-03 DIAGNOSIS — Z23 Encounter for immunization: Secondary | ICD-10-CM

## 2021-09-03 DIAGNOSIS — J441 Chronic obstructive pulmonary disease with (acute) exacerbation: Secondary | ICD-10-CM

## 2021-09-03 DIAGNOSIS — Z933 Colostomy status: Secondary | ICD-10-CM

## 2021-09-03 DIAGNOSIS — Z0000 Encounter for general adult medical examination without abnormal findings: Secondary | ICD-10-CM

## 2021-09-03 DIAGNOSIS — R739 Hyperglycemia, unspecified: Secondary | ICD-10-CM

## 2021-09-03 DIAGNOSIS — E1122 Type 2 diabetes mellitus with diabetic chronic kidney disease: Secondary | ICD-10-CM

## 2021-09-03 DIAGNOSIS — Z87891 Personal history of nicotine dependence: Secondary | ICD-10-CM

## 2021-09-03 DIAGNOSIS — N1831 Chronic kidney disease, stage 3a: Secondary | ICD-10-CM

## 2021-09-03 DIAGNOSIS — E1165 Type 2 diabetes mellitus with hyperglycemia: Secondary | ICD-10-CM

## 2021-09-03 DIAGNOSIS — J439 Emphysema, unspecified: Secondary | ICD-10-CM

## 2021-09-03 MED ORDER — FLUTICASONE-SALMETEROL 250-50 MCG/ACT IN AEPB: 1.0000 | INHALATION_SPRAY | Freq: Two times a day (BID) | RESPIRATORY_TRACT | 11 refills | Status: DC

## 2021-09-03 MED ORDER — FLUTICASONE-SALMETEROL 250-50 MCG/ACT IN AEPB
1.0000 | INHALATION_SPRAY | Freq: Two times a day (BID) | RESPIRATORY_TRACT | 11 refills | Status: DC
  Filled 2021-09-03: qty 60, 30d supply, fill #0

## 2021-09-03 NOTE — Patient Instructions (Addendum)
Please take your inhalers advair and tiotropium or spiriva every day.   We will see about getting you assistance for your colostomy bag.   Please wear your oxygen when you are walking.  Tafadhali chukua vipulizia vyako vya advair na tiotropium au spiriva kila siku.  Tutaona jinsi ya kupata usaidizi wa mfuko wako wa colostomy.  Tafadhali vaa oksijeni yako [place]   Please follow up with the lung doctors.   [address], [place], [place] [address] for your lung appointment at 1130 am with the LUNG doctors.

## 2021-09-04 ENCOUNTER — Telehealth: Payer: Self-pay | Admitting: *Deleted

## 2021-09-04 ENCOUNTER — Emergency Department (HOSPITAL_COMMUNITY)
Admission: EM | Admit: 2021-09-04 | Discharge: 2021-09-05 | Discharge status: Against Medical Advice | Payer: BC Managed Care – PPO | Attending: Emergency Medicine | Admitting: Emergency Medicine

## 2021-09-04 ENCOUNTER — Encounter (HOSPITAL_COMMUNITY): Payer: Self-pay | Admitting: Emergency Medicine

## 2021-09-04 DIAGNOSIS — Z5321 Procedure and treatment not carried out due to patient leaving prior to being seen by health care provider: Secondary | ICD-10-CM | POA: Diagnosis not present

## 2021-09-04 DIAGNOSIS — R739 Hyperglycemia, unspecified: Secondary | ICD-10-CM | POA: Diagnosis present

## 2021-09-04 LAB — CBC
HCT: 45.4 % (ref 39.0–52.0)
Hematocrit: 47.3 % (ref 37.5–51.0)
Hemoglobin: 16.1 g/dL (ref 13.0–17.7)
Hemoglobin: 15.5 g/dL (ref 13.0–17.0)
MCH: 33.3 pg — ABNORMAL HIGH (ref 26.6–33.0)
MCH: 33.3 pg (ref 26.0–34.0)
MCHC: 34 g/dL (ref 31.5–35.7)
MCHC: 34.1 g/dL (ref 30.0–36.0)
MCV: 98 fL — ABNORMAL HIGH (ref 79–97)
MCV: 97.6 fL (ref 80.0–100.0)
Platelets: 266 10*3/uL (ref 150–450)
Platelets: 297 10*3/uL (ref 150–400)
RBC: 4.84 x10E6/uL (ref 4.14–5.80)
RBC: 4.65 MIL/uL (ref 4.22–5.81)
RDW: 11.9 % (ref 11.6–15.4)
RDW: 12.3 % (ref 11.5–15.5)
WBC: 9.3 10*3/uL (ref 3.4–10.8)
WBC: 8.6 10*3/uL (ref 4.0–10.5)
nRBC: 0 % (ref 0.0–0.2)

## 2021-09-04 LAB — URINALYSIS, ROUTINE W REFLEX MICROSCOPIC
Bacteria, UA: NONE SEEN
Bilirubin Urine: NEGATIVE
Glucose, UA: >=500 mg/dL — AB
Hgb urine dipstick: NEGATIVE
Ketones, ur: NEGATIVE mg/dL
Leukocytes,Ua: NEGATIVE
Nitrite: NEGATIVE
Protein, ur: NEGATIVE mg/dL
Specific Gravity, Urine: 1.029 (ref 1.005–1.030)
pH: 6 (ref 5.0–8.0)

## 2021-09-04 LAB — BASIC METABOLIC PANEL
Anion gap: 16 — ABNORMAL HIGH (ref 5–15)
BUN: 43 mg/dL — ABNORMAL HIGH (ref 6–20)
CO2: 22 mmol/L (ref 22–32)
Calcium: 9.8 mg/dL (ref 8.9–10.3)
Chloride: 93 mmol/L — ABNORMAL LOW (ref 98–111)
Creatinine, Ser: 1.5 mg/dL — ABNORMAL HIGH (ref 0.61–1.24)
GFR, Estimated: 56 mL/min — ABNORMAL LOW (ref >60)
Glucose, Bld: 693 mg/dL (ref 70–99)
Potassium: 4.6 mmol/L (ref 3.5–5.1)
Sodium: 131 mmol/L — ABNORMAL LOW (ref 135–145)

## 2021-09-04 LAB — BMP8+ANION GAP
Anion Gap: 20 mmol/L — ABNORMAL HIGH (ref 10.0–18.0)
BUN/Creatinine Ratio: 31 — ABNORMAL HIGH (ref 9–20)
BUN: 36 mg/dL — ABNORMAL HIGH (ref 6–24)
CO2: 20 mmol/L (ref 20–29)
Calcium: 10.2 mg/dL (ref 8.7–10.2)
Chloride: 94 mmol/L — ABNORMAL LOW (ref 96–106)
Creatinine, Ser: 1.17 mg/dL (ref 0.76–1.27)
Glucose: 524 mg/dL (ref 70–99)
Potassium: 4.4 mmol/L (ref 3.5–5.2)
Sodium: 134 mmol/L (ref 134–144)
eGFR: 76 mL/min/{1.73_m2} (ref >59)

## 2021-09-04 LAB — CBG MONITORING, ED: Glucose-Capillary: >600 mg/dL (ref 70–99)

## 2021-09-04 NOTE — ED Notes (Signed)
Tried to get vitals again. The PT. Refused and walked out.

## 2021-09-04 NOTE — Telephone Encounter (Signed)
Received call from [place] with [employer] with critical result for glucose 524. States it was in contact with cells so may not be accurate. Provider was aware of this result this morning and requested patient present to ED. Patient checked in to ED at 1050.

## 2021-09-04 NOTE — Assessment & Plan Note (Signed)
Discussed with patient that we would reach out to Dr. Byrd Hesselbach office to have him evaluated for possible ostomy reversal. Per chart review it appears that this may not be possible. Nevertheless, scheduled patient an appointment with Dr. Byrd Hesselbach' office.

## 2021-09-04 NOTE — ED Triage Notes (Signed)
Patient sent to ED for hyperglycemia. Patient has no complaints and states he is unsure of why he was told to go to the ED. Patient is alert, oriented, ambulatory, and in no apparent distress at this time.

## 2021-09-04 NOTE — Assessment & Plan Note (Signed)
Well controlled. Last A1c 5.9

## 2021-09-04 NOTE — Assessment & Plan Note (Signed)
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 Appears euvolemic on exam. Not currently on GDMT. Will readdress this at next clinic visit.

## 2021-09-04 NOTE — ED Notes (Signed)
Pt. Refused to let me get his vitals

## 2021-09-04 NOTE — Progress Notes (Signed)
CC: Hospital follow up after recent hospitalization for COPD exacerbation   HPI:  [name] is a 51 y.o. M with PMH per below who presents for hospital follow up after recent hospitalization for her COPD exacerbation. Please see problem based charting under encounters tab for further details.    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)    TB (pulmonary tuberculosis)    previously treated according to refugee documentation  Low grade intraepithelial lesion  Past Surgical History:  Procedure Laterality Date   BRONCHIAL WASHINGS Bilateral 04/23/2021   Procedure: BRONCHIAL WASHINGS;  Surgeon: 06/21/2021, MD;  Location[facility] ENDOSCOPY;  Service: Pulmonary;  Laterality: Bilateral;   FRACTURE SURGERY     IM NAILING TIBIA Right 03/21/2014   ORIF ANKLE FRACTURE Right 03/21/2014   lateral malleolus/notes 03/21/2014   ORIF ANKLE FRACTURE Right 03/21/2014   Procedure: OPEN REDUCTION INTERNAL FIXATION (ORIF) pilon ;  Surgeon: 05/20/2014, MD;  Location: [facility] OR;  Service: Orthopedics;  Laterality: Right;   TEE WITHOUT CARDIOVERSION N/A 05/23/2019   Procedure: TRANSESOPHAGEAL ECHOCARDIOGRAM (TEE);  Surgeon: 07/23/2019, MD;  Location[facility] ENDOSCOPY;  Service: Cardiovascular;  Laterality: N/A;   TIBIA IM NAIL INSERTION Right 03/21/2014   Procedure: INTRAMEDULLARY (IM) NAIL TIBIAL;  Surgeon: 05/20/2014, MD;  Location: [facility] OR;  Service: Orthopedics;  Laterality: Right;   VIDEO BRONCHOSCOPY Bilateral 03/02/2016   Procedure: VIDEO BRONCHOSCOPY WITHOUT FLUORO;  Surgeon: 03/04/2016, MD;  Location[facility] ENDOSCOPY;  Service: Cardiopulmonary;  Laterality: Bilateral;   VIDEO BRONCHOSCOPY Left 04/23/2021   Procedure: VIDEO BRONCHOSCOPY WITHOUT FLUORO;  Surgeon: 06/21/2021, MD;  Location: [facility] ENDOSCOPY;  Service: Pulmonary;  Laterality: Left;   12/05/2018 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) Local tissue rearrangement and scrotal flap 12/12/2018 04/2020 Excision of perianal condyloma   Social History   Socioeconomic History   Marital status: Widowed    Spouse name: Not on file   Number of children: Not on file   Years of education: Not on file   Highest education level: Not on file  Occupational History   Not on file  Tobacco Use   Smoking status: Former    Packs/day: 0.50    Years: 6.00    Total pack years: 3.00    Types: Cigarettes    Quit date: 03/16/2001    Years since quitting: 20.4   Smokeless tobacco: Never   Tobacco comments:    "quit smoking ~ 2003"  Vaping Use   Vaping Use: Never used  Substance and Sexual Activity   Alcohol use: Not Currently    Comment: drinks bottled beer intermittently   Drug use: No  Sexual activity: Not Currently    Partners: Female    Comment: declined condoms  Other Topics Concern   Not on file  Social History Narrative   As of 09/18/2013:   -Arrived in [place]: September 05, 2013    -Refugee from [facility], spent 4 years in refugee camp in [place].    -Language: [name], requires Architect (speaks essentially no [place])   -Education: no formal education, previously worked as a Product/process development scientist: family phone number [phone], caseworker is [name] [phone]  (with [employer])   -lives with daughter and three sons   -denies alcohol, drug, tobacco abuse      [facility] (02/28/16):   Patient now admitted to drinking bottled beer. Reports continued tobacco abstinence. Denies any bird or mold exposure. No pets currently. No recent travel.   Social Determinants of Health   Financial Resource Strain: Not on file  Food Insecurity: Not on file  Transportation Needs: Not on file  Physical Activity: Not on file  Stress: Not on file  Social Connections: Not on file  Intimate Partner Violence: Not on file     Review of Systems:  Please see problem based charting under encounters tab for further details.    Physical Exam:  Vitals:   09/03/21 1105 09/03/21 1144  BP: 134/85   Pulse: (!) 115   Temp: 98.4 F (36.9 C)   TempSrc: Oral   SpO2: 94% (!) 87%  Weight: 169 lb 12.1 oz (77 kg)     Constitutional: Well-developed, well-nourished, and in no distress.  HENT:  Head: Normocephalic and atraumatic.  Eyes: EOM are normal.  Neck: Normal range of motion.  Cardiovascular: Normal rate, regular rhythm, intact distal pulses. No gallop and no friction rub.  No murmur heard. No lower extremity edema  Pulmonary: Non labored breathing on room air, no wheezing or rales  Abdominal: Soft. Normal bowel sounds. Non distended and non tender. Ostomy bag in place, w/ soft brown stool. Scotch tape around ostomy appliance Musculoskeletal: Normal range of motion.        General: No tenderness or edema.  Neurological: Alert and oriented to person, place, and time. Non focal  Skin: Skin is warm and dry.    Assessment & Plan:   See Encounters Tab for problem based charting.  Patient discussed with Dr. Oswaldo Done

## 2021-09-04 NOTE — Assessment & Plan Note (Addendum)
Patient appears to be improving from a respiratory standpoint. He is eager to get back to work and states that he has not needed his supplemental oxygen. Patient with O2 sats of 94% at rest. He did desat to 87% with ambulation. He states he is taking his medications but is unsure of their exact names. He denies having wheezing, dyspnea, or having to use his albuterol inhaler.   He had no wheezing or rhonchi on exam. He was not in distress and did not require the use of accessory muscles.   Given his recent hospitalization for COPD exacerbation prescribed patient symbicort in addition to his tiotropium and PRN albuterol. Patient will continue his prednisone taper as discussed on discharge. Also discussed with patient that he should wear his oxygen with ambulation to prevent hypoxia and complications such as arrhythmias and eventuallly death.   Will recheck patient's BMP for his glucose level while on prednisone. His blood sugars were elevated during his most recent hospitalization.   ADDENDUM: Patient's blood sugars were elevated.     Latest Ref Rng & Units 09/04/2021   11:03 AM 09/03/2021   12:15 PM 08/29/2021    7:03 AM  BMP  Glucose 70 - 99 mg/dL 341  937  902   BUN 6 - 20 mg/dL 43  36  33   Creatinine 0.61 - 1.24 mg/dL 4.09  7.35  3.29   BUN/Creat Ratio 9 - 20  31    Sodium 135 - 145 mmol/L 131  134  137   Potassium 3.5 - 5.1 mmol/L 4.6  4.4  3.7   Chloride 98 - 111 mmol/L 93  94  99   CO2 22 - 32 mmol/L 22  20  28    Calcium 8.9 - 10.3 mg/dL 9.8   9.9   Discussed with patient that he should stay hydrated as he noted some polyuria but no dizziness or changes in vision. Also discussed with patient that he should present to the hospital for further management of his elevated blood sugars. Patient agreed. This information was conveyed via an interpreter.

## 2021-09-04 NOTE — Assessment & Plan Note (Signed)
Continue tivicay and symtuza. His HIV viral count is <20. His most recent CD4 count was 104 and he is on bactrim. Discussed with patient that he should have an appointment soon to ensure he is on the proper medications.

## 2021-09-04 NOTE — Assessment & Plan Note (Signed)
Baseline serum creatinine ~1.1-1.4.

## 2021-09-05 DIAGNOSIS — R739 Hyperglycemia, unspecified: Secondary | ICD-10-CM | POA: Insufficient documentation

## 2021-09-05 HISTORY — DX: Hyperglycemia, unspecified: R73.9

## 2021-09-11 ENCOUNTER — Ambulatory Visit (INDEPENDENT_AMBULATORY_CARE_PROVIDER_SITE_OTHER): Payer: BC Managed Care – PPO | Admitting: Infectious Diseases

## 2021-09-11 ENCOUNTER — Encounter: Payer: Self-pay | Admitting: Infectious Diseases

## 2021-09-11 ENCOUNTER — Other Ambulatory Visit: Payer: Self-pay

## 2021-09-11 VITALS — BP 110/78 | HR 91 | Temp 97.8°F | Wt 168.0 lb

## 2021-09-11 DIAGNOSIS — J479 Bronchiectasis, uncomplicated: Secondary | ICD-10-CM | POA: Diagnosis not present

## 2021-09-11 DIAGNOSIS — Z23 Encounter for immunization: Secondary | ICD-10-CM | POA: Diagnosis not present

## 2021-09-11 DIAGNOSIS — R739 Hyperglycemia, unspecified: Secondary | ICD-10-CM | POA: Diagnosis not present

## 2021-09-11 DIAGNOSIS — B20 Human immunodeficiency virus [HIV] disease: Secondary | ICD-10-CM

## 2021-09-11 DIAGNOSIS — T380X5A Adverse effect of glucocorticoids and synthetic analogues, initial encounter: Secondary | ICD-10-CM | POA: Diagnosis not present

## 2021-09-11 NOTE — Progress Notes (Signed)
Established Patient Office Visit  Subjective:  Patient ID: [name], male    DOB: 1971/02/01  Age: 51 y.o. MRN: [id number]  CC:  Chief Complaint  Patient presents with   Follow-up    b20    HPI [name] presents ffor hospital follow up after admission for acute exacerbation of COPD.   Discharged on 6/17 with 7d course of Levaquin and 2 weeks of prednisone taper. Influenza A (+) at that time. No respiratory cultures were collected from inpatient stay. He feels he is doing much better since completing his treatments. He only shows me his inhalers today.   Went to IM clinic for follow up and his blood sugars were > 500. He was instructed to go to ER for care but when he heard the wait time he left.  He says he feels much better than last week. Denies any frequent urination, excessive thirst or hunger. He does check his blood sugars at home and < 200 on capillary.   His viral load in the hospital 2 weeks ago was undetectable - he has continued his Bactrim, Symtuza and Tivicay everyday. During acute illness his CD4 was 104. He has not had his prevnar 20 yet.    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[facility] 04/24/2016   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[place]    TB (pulmonary tuberculosis)    previously treated according to refugee documentation    Past Surgical History:  Procedure Laterality Date   BRONCHIAL WASHINGS Bilateral  04/23/2021   Procedure: BRONCHIAL WASHINGS;  Surgeon: SCandee Furbish MD;  [facility]  Service: Pulmonary;  Laterality: Bilateral;   FRACTURE SURGERY     IM NAILING TIBIA Right 03/21/2014   ORIF ANKLE FRACTURE Right 03/21/2014   lateral malleolus/notes 03/21/2014   ORIF ANKLE FRACTURE Right 03/21/2014   Procedure: OPEN REDUCTION INTERNAL FIXATION (ORIF) pilon ;  Surgeon: MMarybelle Killings MD;  [facility]  Service: Orthopedics;  Laterality: Right;   TEE WITHOUT CARDIOVERSION N/A 05/23/2019   Procedure: TRANSESOPHAGEAL ECHOCARDIOGRAM (TEE);  Surgeon: RSkeet Latch MD;  [facility]  Service: Cardiovascular;  Laterality: N/A;   TIBIA IM NAIL INSERTION Right 03/21/2014   Procedure: INTRAMEDULLARY (IM) NAIL TIBIAL;  Surgeon: MMarybelle Killings MD;  [facility]  Service: Orthopedics;  Laterality: Right;   VIDEO BRONCHOSCOPY Bilateral 03/02/2016   Procedure: VIDEO BRONCHOSCOPY WITHOUT FLUORO;  Surgeon: JJavier Glazier MD;  [facility]  Service: Cardiopulmonary;  Laterality: Bilateral;   VIDEO BRONCHOSCOPY Left 04/23/2021   Procedure: VIDEO BRONCHOSCOPY WITHOUT FLUORO;  Surgeon: SCandee Furbish MD;  [facility];  Service: Pulmonary;  Laterality: Left;    Family History  Problem Relation Age of Onset   Hypertension Other    Heart disease Sister     Social History   Socioeconomic History   Marital status: Widowed  Spouse name: Not on file   Number of children: Not on file   Years of education: Not on file   Highest education level: Not on file  Occupational History   Not on file  Tobacco Use   Smoking status: Former    Packs/day: 0.50    Years: 6.00    Total pack years: 3.00    Types: Cigarettes    Quit date: 03/16/2001    Years since quitting: 20.5   Smokeless tobacco: Never   Tobacco comments:    "quit smoking ~ 2003"  Vaping Use   Vaping Use: Never used  Substance and Sexual Activity   Alcohol use: Not Currently    Comment: drinks  bottled beer intermittently   Drug use: No   Sexual activity: Not Currently    Partners: Female    Comment: declined condoms  Other Topics Concern   Not on file  Social History Narrative   As of 09/18/2013:   -Arrived in [place]: September 05, 2013    -Refugee from [facility], spent 4 years in refugee camp in [place].    -Language: [name], requires Pharmacist, community (speaks essentially no [place])   -Education: no formal education, previously worked as a Gaffer: family phone number [phone], caseworker is Laqueta Linden Dar Bi [phone] (with [employer])   -lives with daughter and three sons   -denies alcohol, drug, tobacco abuse      [facility] (02/28/16):   Patient now admitted to drinking bottled beer. Reports continued tobacco abstinence. Denies any bird or mold exposure. No pets currently. No recent travel.   Social Determinants of Health   Financial Resource Strain: Not on file  Food Insecurity: Not on file  Transportation Needs: Not on file  Physical Activity: Not on file  Stress: Not on file  Social Connections: Not on file  Intimate Partner Violence: Not on file    Outpatient Medications Prior to Visit  Medication Sig Dispense Refill   albuterol (VENTOLIN HFA) 108 (90 Base) MCG/ACT inhaler Inhale 2 puffs into the lungs every 6 (six) hours as needed for wheezing or shortness of breath. 18 g 2   budesonide-formoterol (SYMBICORT) 160-4.5 MCG/ACT inhaler Inhale 2 puffs into the lungs 2 (two) times daily. 10.2 g 0   Darunavir-Cobicistat-Emtricitabine-Tenofovir Alafenamide (SYMTUZA) 800-150-200-10 MG TABS TAKE 1 TABLET BY MOUTH 1 TIME A DAY WITH BREAKFAST (Patient taking differently: Take 1 tablet by mouth daily with breakfast.) 30 tablet 5   dolutegravir (TIVICAY) 50 MG tablet TAKE ONE TABLET BY MOUTH ONCE DAILY. STORE AT ROOM TEMPERATURE (Patient taking differently: Take 50 mg by mouth daily.) 30 tablet 5   fluticasone-salmeterol (ADVAIR) 250-50 MCG/ACT AEPB Inhale 1  puff into the lungs in the morning and at bedtime. 60 each 11   guaiFENesin (ROBITUSSIN) 100 MG/5ML liquid Take 5 mLs by mouth every 4 (four) hours. (Patient taking differently: Take 5 mLs by mouth every 4 (four) hours as needed for cough or to loosen phlegm.) 120 mL 0   ibuprofen (ADVIL) 200 MG tablet Take 400 mg by mouth every 6 (six) hours as needed for mild pain, fever or headache.     OXYGEN Inhale 2 L into the lungs continuous.     sulfamethoxazole-trimethoprim (BACTRIM DS) 800-160 MG tablet Take 1 tablet by mouth daily. (Patient taking differently: Take 1 tablet by mouth daily. Continuously) 90 tablet 0   Tiotropium Bromide Monohydrate (SPIRIVA RESPIMAT) 2.5 MCG/ACT AERS Inhale 2 puffs into the lungs daily. 4 g 0  Vitamin D, Ergocalciferol, (DRISDOL) 1.25 MG (50000 UNIT) CAPS capsule Take 1 capsule (50,000 Units total) by mouth every 7 (seven) days. 4 capsule 0   Triamcinolone Acetonide (TRIAMCINOLONE 0.1 % CREAM : EUCERIN) CREA Apply 1 application topically 3 (three) times daily as needed. (Patient not taking: Reported on 08/22/2021) 1 each 0   triamcinolone ointment (KENALOG) 0.1 % Apply 1 application. topically 2 (two) times daily. (Patient not taking: Reported on 08/22/2021) 453.6 g 1   No facility-administered medications prior to visit.    Allergies  Allergen Reactions   Metformin And Related Swelling and Rash    Pt doesn't remember this allergy, but does state he gets rash. Unsure if related to medication.    ROS Review of Systems  Constitutional:  Negative for appetite change, chills, fatigue, fever and unexpected weight change.  Eyes:  Negative for visual disturbance.  Respiratory:  Negative for cough and shortness of breath.   Cardiovascular:  Negative for chest pain and leg swelling.  Gastrointestinal:  Negative for abdominal pain, diarrhea and nausea.  Genitourinary:  Negative for dysuria, genital sores and penile discharge.  Musculoskeletal:  Negative for joint swelling.   Skin:  Negative for color change and rash.  Neurological:  Negative for dizziness and headaches.  Hematological:  Negative for adenopathy.  Psychiatric/Behavioral:  Negative for sleep disturbance. The patient is not nervous/anxious.   All other systems reviewed and are negative.     Objective:    Physical Exam Vitals reviewed.  Constitutional:      Appearance: Normal appearance. He is not ill-appearing.  HENT:     Mouth/Throat:     Mouth: Mucous membranes are moist.     Pharynx: Oropharynx is clear.  Eyes:     Conjunctiva/sclera: Conjunctivae normal.     Pupils: Pupils are equal, round, and reactive to light.  Cardiovascular:     Rate and Rhythm: Normal rate.  Pulmonary:     Effort: Pulmonary effort is normal.  Abdominal:     General: There is no distension.  Musculoskeletal:        General: Normal range of motion.  Skin:    General: Skin is warm and dry.  Neurological:     Mental Status: He is alert and oriented to person, place, and time.     BP 110/78   Pulse 91   Temp 97.8 F (36.6 C) (Temporal)   Wt 168 lb (76.2 kg)   BMI 27.12 kg/m  Wt Readings from Last 3 Encounters:  09/11/21 168 lb (76.2 kg)  09/03/21 169 lb 12.1 oz (77 kg)  08/27/21 165 lb 5.5 oz (75 kg)    Health Maintenance Due  Topic Date Due   FOOT EXAM  Never done   OPHTHALMOLOGY EXAM  Never done   URINE MICROALBUMIN  Never done   Zoster Vaccines- Shingrix (1 of 2) Never done   COVID-19 Vaccine (2 - Pfizer risk series) 01/26/2020    There are no preventive care reminders to display for this patient.  Lab Results  Component Value Date   TSH 1.216 11/11/2018   Lab Results  Component Value Date   WBC 8.6 09/04/2021   HGB 15.5 09/04/2021   HCT 45.4 09/04/2021   MCV 97.6 09/04/2021   PLT 297 09/04/2021   Lab Results  Component Value Date   NA 131 (L) 09/04/2021   K 4.6 09/04/2021   CO2 22 09/04/2021   GLUCOSE 693 (HH) 09/04/2021   BUN 43 (H) 09/04/2021   CREATININE 1.50 (  H)  09/04/2021   BILITOT 0.4 08/26/2021   ALKPHOS 50 08/26/2021   AST 22 08/26/2021   ALT 16 08/26/2021   PROT 7.6 08/26/2021   ALBUMIN 2.9 (L) 08/26/2021   CALCIUM 9.8 09/04/2021   ANIONGAP 16 (H) 09/04/2021   EGFR 76 09/03/2021   Lab Results  Component Value Date   CHOL 155 08/22/2019   Lab Results  Component Value Date   HDL 65 08/22/2019   Lab Results  Component Value Date   LDLCALC 78 08/22/2019   Lab Results  Component Value Date   TRIG 50 08/22/2019   Lab Results  Component Value Date   CHOLHDL 2.4 08/22/2019   Lab Results  Component Value Date   HGBA1C 5.9 (H) 08/26/2021      Assessment & Plan:   Problem List Items Addressed This Visit       Unprioritized   AIDS (acquired immune deficiency syndrome) ([place])    CD4 during acute AECOPD and influenza 104 cells. Will repeat in 51mto gauge any improvement with consistent medication adherence.  Continue Bactrim daily.       Bronchiectasis without acute exacerbation ([name])    Acute exacerbation d/t influenza A +/- bacterial component.   Resolved and breathing comfortably now on maintenance inhalers.       HIV disease ([facility]    Viral load during hospital stay undetectable and has been for several consecutive checks now. His adherence has improved since we have helped with the pharmacy dispense trouble. [facility].   Recheck in 328mnd a follow up visit with Dr. SnBaxter Flatteryas been arranged for October.   Sexual health discussed - no concerns or needs today.   Prevnar 20 was given today. Recommend influenza vaccine in September once available.       Steroid-induced hyperglycemia    Acute rise in blood sugars > 500 during 2 week steroid treatment for AECOPD. He is better now with reported capillary sugars < 200. He is not tachycardic/hypotensive and euvolemic appearing today. No excessive thirst/urination.   Will defer repeating blood draw for this today  given these findings.   He likely had augmented steroid effect given co-administration with Symtuza. Next time he needs AECOPD treatment would recommend half the normal dose of prednisone in an effort to avoid extreme side effects.       Other Visit Diagnoses     Human immunodeficiency virus (HIV) disease ([facility]   -  Primary   Relevant Orders   HIV 1 RNA quant-no reflex-bld   T-helper cells (CD4) count   Need for pneumococcal vaccination       Relevant Orders   Pneumococcal conjugate vaccine 20-valent ([id number](Completed)      No orders of the defined types were placed in this encounter.   Follow-up: as scheduled with Dr. SnBaxter Flatteryn October 2nd with labs Sept 18th.    StJanene MadeiraNP

## 2021-09-11 NOTE — Assessment & Plan Note (Signed)
Viral load during hospital stay undetectable and has been for several consecutive checks now. His adherence has improved since we have helped with the pharmacy dispense trouble. Greatly appreciate our pharmacy team [facility] at [facility] to facilitate this.   Recheck in 43m and a follow up visit with Dr. Drue Second has been arranged for October.   Sexual health discussed - no concerns or needs today.   Prevnar 20 was given today. Recommend influenza vaccine in September once available.

## 2021-09-11 NOTE — Assessment & Plan Note (Signed)
Acute exacerbation d/t influenza A +/- bacterial component.   Resolved and breathing comfortably now on maintenance inhalers.

## 2021-09-11 NOTE — Patient Instructions (Addendum)
No changes in your medications today.   You will see Dr. Drue Second again on October 2nd with labs drawn September 18th

## 2021-09-11 NOTE — Assessment & Plan Note (Signed)
Acute rise in blood sugars > 500 during 2 week steroid treatment for AECOPD. He is better now with reported capillary sugars < 200. He is not tachycardic/hypotensive and euvolemic appearing today. No excessive thirst/urination.   Will defer repeating blood draw for this today given these findings.   He likely had augmented steroid effect given co-administration with Symtuza. Next time he needs AECOPD treatment would recommend half the normal dose of prednisone in an effort to avoid extreme side effects.

## 2021-09-11 NOTE — Assessment & Plan Note (Signed)
CD4 during acute AECOPD and influenza 104 cells. Will repeat in 51m to gauge any improvement with consistent medication adherence.  Continue Bactrim daily.

## 2021-09-17 ENCOUNTER — Ambulatory Visit (INDEPENDENT_AMBULATORY_CARE_PROVIDER_SITE_OTHER): Payer: BC Managed Care – PPO | Admitting: Student

## 2021-09-17 ENCOUNTER — Encounter: Payer: Self-pay | Admitting: Student

## 2021-09-17 DIAGNOSIS — J439 Emphysema, unspecified: Secondary | ICD-10-CM

## 2021-09-17 DIAGNOSIS — Z87891 Personal history of nicotine dependence: Secondary | ICD-10-CM

## 2021-09-17 NOTE — Progress Notes (Signed)
   Patient Saturations on Room Air at Rest = 94%   Patient Saturations on Room Air while Ambulating = 90%    Please briefly explain why patient needs home oxygen: Home O2  IS NOT needed at this time.

## 2021-09-17 NOTE — Patient Instructions (Signed)
[name],  It was a pleasure seeing you in the clinic today.  I am glad you are doing better.  You no longer need oxygen supplementation.  You can return to work today.  Please see your lung doctor tomorrow.  Take care,  Dr. Cyndie Chime

## 2021-09-17 NOTE — Assessment & Plan Note (Addendum)
Patient was recently admitted for COPD exacerbation 2/2 influenza A in the setting of HIV/AIDS.  He was discharged on a 2 weeks taper course of steroid with maintenance inhaler of ICS, LABA and LAMA.  He was discharged on 3 L of oxygen nasal cannula supplementation.  Patient has been out of work since his hospitalization due to his oxygen requirement.  We performed ambulatory pulse ox today and his lowest O2 sat was 90%.  He no longer requires oxygen supplementation.  -Work note given today.  Given his physical deconditioning from 2 admissions, will recommend no heavy lifting > 10 lbs for the next 2 months.  No other work restrictions. -Continue his inhalers. -Follow-up with pulmonology tomorrow 09/18/2021

## 2021-09-17 NOTE — Progress Notes (Signed)
   CC: Work restriction letter  HPI:  [name] is a 51 y.o. with past medical history of HIV, PJP, TB, bronchiectasis who presents to the clinic today for repeat ambulatory pulse ox to evaluate for work restriction.  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    Appearance: He is not ill-appearing.  HENT:     Head: Normocephalic.  Eyes:     General:        Right eye: No discharge.        Left eye: No discharge.     Conjunctiva/sclera: Conjunctivae normal.  Cardiovascular:     Rate and Rhythm: Normal rate and regular rhythm.     Heart sounds: Normal heart sounds.  Pulmonary:     Effort: Pulmonary effort is normal. No respiratory distress.     Breath sounds: Normal breath sounds. No wheezing.  Skin:    General: Skin is warm.  Neurological:     Mental Status: He is alert and oriented to person, place, and time.  Psychiatric:        Mood and Affect: Mood normal.      Assessment & Plan:   Pulmonary emphysema  ([name]) Patient was recently admitted for COPD exacerbation 2/2 influenza A in the setting of HIV/AIDS.  He was discharged on a 2 weeks taper course of steroid with maintenance inhaler of ICS, LABA and LAMA.  He was discharged on 3 L of oxygen nasal cannula supplementation.  Patient has been out of work since his hospitalization due to his oxygen requirement.  We performed ambulatory pulse ox today and his lowest O2 sat was 90%.  He no longer requires oxygen supplementation.  -Work note given today.  Given his physical deconditioning from 2 admissions, will recommend no heavy lifting > 10 lbs for the next 2 months.  No other work restrictions. -Continue his inhalers. -Follow-up with pulmonology tomorrow 09/18/2021   See Encounters Tab for problem based charting.  Patient discussed with Dr. 11/19/2021

## 2021-09-18 ENCOUNTER — Ambulatory Visit (INDEPENDENT_AMBULATORY_CARE_PROVIDER_SITE_OTHER): Payer: BC Managed Care – PPO | Admitting: Nurse Practitioner

## 2021-09-18 ENCOUNTER — Encounter: Payer: Self-pay | Admitting: Nurse Practitioner

## 2021-09-18 VITALS — BP 108/62 | HR 90 | Temp 97.7°F | Wt 169.4 lb

## 2021-09-18 DIAGNOSIS — J441 Chronic obstructive pulmonary disease with (acute) exacerbation: Secondary | ICD-10-CM

## 2021-09-18 DIAGNOSIS — Z7951 Long term (current) use of inhaled steroids: Secondary | ICD-10-CM

## 2021-09-18 DIAGNOSIS — B20 Human immunodeficiency virus [HIV] disease: Secondary | ICD-10-CM

## 2021-09-18 DIAGNOSIS — J439 Emphysema, unspecified: Secondary | ICD-10-CM

## 2021-09-18 DIAGNOSIS — Z87891 Personal history of nicotine dependence: Secondary | ICD-10-CM

## 2021-09-18 DIAGNOSIS — J479 Bronchiectasis, uncomplicated: Secondary | ICD-10-CM

## 2021-09-18 DIAGNOSIS — I5022 Chronic systolic (congestive) heart failure: Secondary | ICD-10-CM | POA: Diagnosis not present

## 2021-09-18 NOTE — Assessment & Plan Note (Addendum)
BTX throughout left lung. Previous AFB/cultures negative from 04/2021 bronchoscopy. Sputum culture ordered during his stay but I do not see where this was ever collected. Currently his cough has resolved. Still sounds congested upon exam.  He had stopped guaifenesin after discharge. Recommended he restart twice daily and use flutter valve for mucociliary clearance.

## 2021-09-18 NOTE — Assessment & Plan Note (Addendum)
Resolving AECOPD. He has clinically improved and seems to be recovering well since he hospitalization. He has not had formal PFTs in the past so these were ordered today for further evaluation of his lung function. Recommended he continue triple therapy with Symbicort and Spiriva, and PRN albuterol. Oxygen stable on room air in the 90's. Will check ONO to rule out nocturnal hypoxemia.   Patient Instructions  Continue Albuterol inhaler 2 puffs every 6 hours as needed for shortness of breath or wheezing. Notify if symptoms persist despite rescue inhaler/neb use. Continue Symbicort 2 puffs Twice daily. Brush tongue and rinse mouth afterwards Continue Spiriva 2 puffs daily   Mucinex 600 mg Twice daily for chest congestion Flutter valve 2-3 times per day   Pulmonary function testing scheduled today   Follow up with Dr. Vassie Loll after PFTs. If symptoms do not improve or worsen, please contact office for sooner follow up or seek emergency care.

## 2021-09-18 NOTE — Assessment & Plan Note (Signed)
Appears compensated on exam. Follow up with cardiology as scheduled.  ?

## 2021-09-18 NOTE — Progress Notes (Signed)
@Patient  ID: , male    DOB: 04-Mar-1971, 51 y.o.   MRN: [id number]  Chief Complaint  Patient presents with   Follow-up    Patient states things have been good since he left the hospital.     Referring provider: No ref. provider found  HPI: 51 year old male, former smoker followed for bronchiectasis and COPD. He is new to the pulmonary clinic and last seen during his hospital stay by Dr. 44. Past medical history significant for CHF, DM II, CKD, HIV AIDS, anemia.   He was recently hospitalized from 08/22/2021 to 08/25/2021 for influenza A and associated acute respiratory failure discharged home on 2 lpm. He was then readmitted on 08/26/2021 with worsening symptoms and admitted for AECOPD and acute respiratory failure. He was seen by PCCM during his stay and started on triple therapy inhaler regimen and mucociliary clearance therapies. He was treated with empiric antibiotic course, steroids and was discharged on 08/30/2021.   TEST/EVENTS:  08/26/2021 CT chest without contrast: Small hiatal hernia.  No LAD.  There is severe emphysematous changes again seen.  There is pleural-parenchymal scarring in the upper lungs which appear stable.  There is a focal density in the left upper lobe measuring 1.3 x 1.5 cm and appears unchanged when compared to previous.  There is a cluster of slightly nodular airspace opacities in the superior segment of the left lower lobe which also appears unchanged.  There is bronchiectasis throughout the left lower lobe.  There are increasing air-fluid levels throughout the dilated bronchi in the left lower lobe.  There is bibasilar atelectasis or scarring.  09/18/2021: Today - follow up Patient presents today with interpretor for hospital follow up. He reports that he has been doing well since discharge. He feels like his breathing is improving, but not quite back to his baseline. He does feel significantly better than he did prior to discharge. His cough has  resolved and he hasn't noticed any wheezing. He completed the antibiotics and prednisone as prescribed. He has started on Symbicort Twice daily and Spiriva daily for management of his COPD and feels like these are working well for him. He has not had to use his rescue. He denies any fevers, night sweats, anorexia, weight loss, leg swelling. He has been able to come off his oxygen.   Allergies  Allergen Reactions   Metformin And Related Swelling and Rash    Pt doesn't remember this allergy, but does state he gets rash. Unsure if related to medication.    Immunization History  Administered Date(s) Administered   Hepatitis A, Adult 10/09/2013   Hepatitis B, adult 10/09/2013, 11/13/2013   Influenza, Seasonal, Injecte, Preservative Fre 12/28/2016   Influenza,inj,Quad PF,6+ Mos 11/13/2013, 02/29/2016, 02/23/2019, 05/20/2019   PFIZER(Purple Top)SARS-COV-2 Vaccination 01/05/2020   PNEUMOCOCCAL CONJUGATE-20 09/11/2021   Pneumococcal Conjugate-13 10/09/2013   Pneumococcal Polysaccharide-23 02/29/2016   Tdap 09/03/2021    Past Medical History:  Diagnosis Date   Acute pulmonary embolism ([name]) 11/01/2020   Acute pulmonary embolus ([name]) 02/24/2016   Dx December 2017 taking Eliquis 5mg  BID   Bronchiectasis with acute exacerbation ([name])    Depression    "stress and depression for any man is common" (03/21/2014)   Diabetes mellitus without complication ([name])    DVT (deep venous thrombosis) ([name]) 11/03/2020   Dyspnea    Genital warts 01/04/2017   Hepatitis    "I don't know what hepatitis I have"   HIV disease ([name])    MSSA bacteremia  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[place]) 04/24/2016   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[place])    TB (pulmonary tuberculosis)    previously treated according to refugee documentation    Tobacco History: Social History   Tobacco Use  Smoking Status Former   Packs/day: 0.50   Years: 6.00   Total pack years: 3.00   Types:  Cigarettes   Quit date: 03/16/2001   Years since quitting: 20.5  Smokeless Tobacco Never  Tobacco Comments   "quit smoking ~ 2003"   Counseling given: Not Answered Tobacco comments: "quit smoking ~ 2003"   Outpatient Medications Prior to Visit  Medication Sig Dispense Refill   albuterol (VENTOLIN HFA) 108 (90 Base) MCG/ACT inhaler Inhale 2 puffs into the lungs every 6 (six) hours as needed for wheezing or shortness of breath. 18 g 2   budesonide-formoterol (SYMBICORT) 160-4.5 MCG/ACT inhaler Inhale 2 puffs into the lungs 2 (two) times daily. 10.2 g 0   Darunavir-Cobicistat-Emtricitabine-Tenofovir Alafenamide (SYMTUZA) 800-150-200-10 MG TABS TAKE 1 TABLET BY MOUTH 1 TIME A DAY WITH BREAKFAST (Patient taking differently: Take 1 tablet by mouth daily with breakfast.) 30 tablet 5   dolutegravir (TIVICAY) 50 MG tablet TAKE ONE TABLET BY MOUTH ONCE DAILY. STORE AT ROOM TEMPERATURE (Patient taking differently: Take 50 mg by mouth daily.) 30 tablet 5   fluticasone-salmeterol (ADVAIR) 250-50 MCG/ACT AEPB Inhale 1 puff into the lungs in the morning and at bedtime. 60 each 11   guaiFENesin (ROBITUSSIN) 100 MG/5ML liquid Take 5 mLs by mouth every 4 (four) hours. (Patient taking differently: Take 5 mLs by mouth every 4 (four) hours as needed for cough or to loosen phlegm.) 120 mL 0   ibuprofen (ADVIL) 200 MG tablet Take 400 mg by mouth every 6 (six) hours as needed for mild pain, fever or headache.     OXYGEN Inhale 2 L into the lungs continuous.     sulfamethoxazole-trimethoprim (BACTRIM DS) 800-160 MG tablet Take 1 tablet by mouth daily. (Patient taking differently: Take 1 tablet by mouth daily. Continuously) 90 tablet 0   Tiotropium Bromide Monohydrate (SPIRIVA RESPIMAT) 2.5 MCG/ACT AERS Inhale 2 puffs into the lungs daily. 4 g 0   Vitamin D, Ergocalciferol, (DRISDOL) 1.25 MG (50000 UNIT) CAPS capsule Take 1 capsule (50,000 Units total) by mouth every 7 (seven) days. 4 capsule 0   No  facility-administered medications prior to visit.     Review of Systems:   Constitutional: No weight loss or gain, night sweats, fevers, chills, or lassitude. +fatigue (improving) HEENT: No headaches, difficulty swallowing, tooth/dental problems, or sore throat. No sneezing, itching, ear ache, nasal congestion, or post nasal drip CV:  No chest pain, orthopnea, PND, swelling in lower extremities, anasarca, dizziness, palpitations, syncope Resp: +shortness of breath with exertion (improving). No excess mucus or change in color of mucus. No productive or non-productive. No hemoptysis. No wheezing.  No chest wall deformity Skin: No rash, lesions, ulcerations MSK:  No joint pain or swelling.  No decreased range of motion.  No back pain. Neuro: No dizziness or lightheadedness.  Psych: No depression or anxiety. Mood stable.     Physical Exam:  BP 108/62   Pulse 90   Temp 97.7 F (36.5 C) (Oral)   Wt 169 lb 6.4 oz (76.8 kg)   SpO2 96%   BMI 27.34 kg/m   GEN: Pleasant, interactive, well-appearing; in no acute distress. HEENT:  Normocephalic and atraumatic. PERRLA. Sclera white. Nasal turbinates pink, moist and patent bilaterally. No  rhinorrhea present. Oropharynx pink and moist, without exudate or edema. No lesions, ulcerations, or postnasal drip.  NECK:  Supple w/ fair ROM. No JVD present. Normal carotid impulses w/o bruits. Thyroid symmetrical with no goiter or nodules palpated. No lymphadenopathy.   CV: RRR, no m/r/g, no peripheral edema. Pulses intact, +2 bilaterally. No cyanosis, pallor or clubbing. PULMONARY:  Unlabored, regular breathing. Minimal scattered rhonchi left posterior lung; right lung clear A&P w/o wheezes/rales/rhonchi. No accessory muscle use. No dullness to percussion. GI: BS present and normoactive. Soft, non-tender to palpation. No organomegaly or masses detected. No CVA tenderness. MSK: No erythema, warmth or tenderness. Cap refil <2 sec all extrem. No deformities or  joint swelling noted.  Neuro: A/Ox3. No focal deficits noted.   Skin: Warm, no lesions or rashe Psych: Normal affect and behavior. Judgement and thought content appropriate.     Lab Results:  CBC    Component Value Date/Time   WBC 8.6 09/04/2021 1103   RBC 4.65 09/04/2021 1103   HGB 15.5 09/04/2021 1103   HGB 16.1 09/03/2021 1215   HCT 45.4 09/04/2021 1103   HCT 47.3 09/03/2021 1215   PLT 297 09/04/2021 1103   PLT 266 09/03/2021 1215   MCV 97.6 09/04/2021 1103   MCV 98 (H) 09/03/2021 1215   MCH 33.3 09/04/2021 1103   MCHC 34.1 09/04/2021 1103   RDW 12.3 09/04/2021 1103   RDW 11.9 09/03/2021 1215   LYMPHSABS 0.5 (L) 08/28/2021 0039   MONOABS 0.4 08/28/2021 0039   EOSABS 0.0 08/28/2021 0039   BASOSABS 0.0 08/28/2021 0039    BMET    Component Value Date/Time   NA 131 (L) 09/04/2021 1103   NA 134 09/03/2021 1215   K 4.6 09/04/2021 1103   CL 93 (L) 09/04/2021 1103   CO2 22 09/04/2021 1103   GLUCOSE 693 (HH) 09/04/2021 1103   BUN 43 (H) 09/04/2021 1103   BUN 36 (H) 09/03/2021 1215   CREATININE 1.50 (H) 09/04/2021 1103   CREATININE 1.24 12/24/2020 1124   CALCIUM 9.8 09/04/2021 1103   GFRNONAA 56 (L) 09/04/2021 1103   GFRNONAA 68 12/04/2019 1118   GFRAA 79 12/04/2019 1118    BNP    Component Value Date/Time   BNP 33.5 08/26/2021 1659     Imaging:  CT CHEST WO CONTRAST  Result Date: 08/26/2021 CLINICAL DATA:  Immunocompromise. Acute on chronic respiratory failure. EXAM: CT CHEST WITHOUT CONTRAST TECHNIQUE: Multidetector CT imaging of the chest was performed following the standard protocol without IV contrast. RADIATION DOSE REDUCTION: This exam was performed according to the departmental dose-optimization program which includes automated exposure control, adjustment of the mA and/or kV according to patient size and/or use of iterative reconstruction technique. COMPARISON:  CT angiogram chest 08/22/2021. FINDINGS: Cardiovascular: No significant vascular findings.  Normal heart size. No pericardial effusion. Mediastinum/Nodes: There is a small hiatal hernia. Esophagus is nondilated. There are no enlarged mediastinal or hilar lymph nodes. Visualized thyroid gland is within normal limits. Lungs/Pleura: Severe emphysematous changes are again seen. Pleural-parenchymal scarring in the upper lungs appears stable. Focal density in the left upper lobe measuring 1.3 x 1.5 cm image 8/64 appears unchanged. Cluster of slightly nodular airspace opacities in the superior segment of the left lower lobe image 8/78 also appears unchanged. Again seen is bronchiectasis throughout the left lower lobe. There are increasing air-fluid levels throughout dilated bronchi throughout the left lower lobe. There is bibasilar atelectasis or scarring. There is no lung consolidation, pleural effusion or pneumothorax. Upper Abdomen: No  acute abnormality. Musculoskeletal: No chest wall mass or suspicious bone lesions identified. IMPRESSION: 1. Chronic bronchiectasis in the left lower lobe is again seen, but there are new air-fluid levels throughout dilated bronchi in this region worrisome for infection. There is no lung consolidation. 2. Otherwise stable appearance of chronic lung disease and emphysema. Electronically Signed   By: Ronney Asters M.D.   On: 08/26/2021 22:23   DG Chest Port 1 View  Result Date: 08/26/2021 CLINICAL DATA:  Shortness of breath. EXAM: PORTABLE CHEST 1 VIEW COMPARISON:  AP chest 08/22/2021, 06/04/2021, 04/16/2021, 03/27/2021; CT chest 08/22/2021 FINDINGS: Heart size is again at the upper limits of normal. Mediastinal contours are within normal limits. There is left lower lobe greater than left upper lobe and right lower lobe bronchiectasis with associated scarring better seen on prior CT. The left mid and lower lung dominant scarring appears unchanged from multiple prior radiographs. There is again flattening of the diaphragms and increased lucent emphysematous change. No definite  increased airspace density compared to baseline radiographs. No definite pleural effusion. No acute skeletal abnormality. IMPRESSION: 1. Redemonstration of extensive left mid and lower lung bronchiectasis and scarring. 2. No definite acute airspace opacity is seen over baseline radiographs to indicate an acute lung process. 3. Advanced COPD/emphysema. Electronically Signed   By: Yvonne Kendall M.D.   On: 08/26/2021 16:26   CT Angio Chest PE W/Cm &/Or Wo Cm  Result Date: 08/22/2021 CLINICAL DATA:  Cough and shortness of breath. EXAM: CT ANGIOGRAPHY CHEST WITH CONTRAST TECHNIQUE: Multidetector CT imaging of the chest was performed using the standard protocol during bolus administration of intravenous contrast. Multiplanar CT image reconstructions and MIPs were obtained to evaluate the vascular anatomy. RADIATION DOSE REDUCTION: This exam was performed according to the departmental dose-optimization program which includes automated exposure control, adjustment of the mA and/or kV according to patient size and/or use of iterative reconstruction technique. CONTRAST:  27mL OMNIPAQUE IOHEXOL 350 MG/ML SOLN COMPARISON:  CT of the chest without contrast on 04/22/2021, CTA of the chest on 11/01/2020 FINDINGS: Cardiovascular: Adequate opacification of pulmonary arteries. No evidence of pulmonary embolism. Stable dilated central pulmonary arteries. Stable heart size with chronic mild dilatation both right and left ventricles. No pericardial fluid. No visualized calcified coronary artery plaque. The thoracic aorta is normal in caliber. Stable normal variant separate origin the left vertebral artery off the aortic arch. Mediastinum/Nodes: Stable small scattered mediastinal lymph nodes and mildly prominent bilateral hilar lymph node tissue likely representing chronic reactive lymph nodes. Thyroid gland, trachea, and esophagus demonstrate no significant findings. Stable small hiatal hernia. Lungs/Pleura: Stable severe chronic  lung disease consisting of bullous disease, prominent bilateral pulmonary scarring and chronic bronchiectasis of the left lung with multiple areas of chronic mucous plugging and bronchial wall thickening. Dense pneumonia of the right lower lobe seen in February has resolved. No acute pulmonary consolidation, edema, pneumothorax or pleural fluid identified. Upper Abdomen: No acute abnormality. Musculoskeletal: No chest wall abnormality. No acute or significant osseous findings. Review of the MIP images confirms the above findings. IMPRESSION: 1. No evidence of pulmonary embolism. 2. Stable evidence of pulmonary hypertension with dilated central pulmonary arteries. 3. Stable probable reactive mediastinal and bilateral hilar lymph nodes due to severe chronic lung disease. 4. Stable severe chronic lung disease with bullous disease, prominent bilateral pulmonary scarring and chronic bronchiectasis in the left lung with multiple areas of chronic mucous plugging and bronchial wall thickening. Acute pneumonia of the right lower lobe seen in February has resolved. No  acute consolidation currently identified. Electronically Signed   By: Aletta Edouard M.D.   On: 08/22/2021 10:28   DG Chest Port 1 View  Result Date: 08/22/2021 CLINICAL DATA:  Chest pain and shortness of breath. EXAM: PORTABLE CHEST 1 VIEW COMPARISON:  06/04/2020 FINDINGS: Stable cardiomediastinal contours. The lungs are hyperinflated and there are advanced changes COPD/emphysema. There are extensive areas of bronchiectasis, architectural distortion and scarring within the left upper lobe and left lower lobe. Mild increase in opacities within the periphery of the left upper lobe which may reflect superimposed inflammation or infection. Right lung is clear. IMPRESSION: 1. Extensive bronchiectasis, scarring and architectural distortion in the left lung with mild superimposed opacities in the left upper lobe which may reflect new inflammation or infection. 2.  Advanced COPD/emphysema.  Emphysema (ICD10-J43.9). Electronically Signed   By: Kerby Moors M.D.   On: 08/22/2021 07:56          No data to display          No results found for: "NITRICOXIDE"      Assessment & Plan:   COPD exacerbation ([place]) Resolving AECOPD. He has clinically improved and seems to be recovering well since he hospitalization. He has not had formal PFTs in the past so these were ordered today for further evaluation of his lung function. Recommended he continue triple therapy with Symbicort and Spiriva, and PRN albuterol. Oxygen stable on room air in the 90's. Will check ONO to rule out nocturnal hypoxemia.   Patient Instructions  Continue Albuterol inhaler 2 puffs every 6 hours as needed for shortness of breath or wheezing. Notify if symptoms persist despite rescue inhaler/neb use. Continue Symbicort 2 puffs Twice daily. Brush tongue and rinse mouth afterwards Continue Spiriva 2 puffs daily   Mucinex 600 mg Twice daily for chest congestion Flutter valve 2-3 times per day   Pulmonary function testing scheduled today   Follow up with Dr. Elsworth Soho after PFTs. If symptoms do not improve or worsen, please contact office for sooner follow up or seek emergency care.      Bronchiectasis without acute exacerbation ([name]) BTX throughout left lung. Previous AFB/cultures negative from 04/2021 bronchoscopy. Sputum culture ordered during his stay but I do not see where this was ever collected. Currently his cough has resolved. Still sounds congested upon exam.  He had stopped guaifenesin after discharge. Recommended he restart twice daily and use flutter valve for mucociliary clearance.   Chronic systolic CHF (congestive heart failure) ([place]) Appears compensated on exam. Follow up with cardiology as scheduled.   AIDS (acquired immune deficiency syndrome) ([name]) Stable CD4 counts. Follows with ID.    I spent 35 minutes of dedicated to the care of this patient on the date of  this encounter to include pre-visit review of records, face-to-face time with the patient discussing conditions above, post visit ordering of testing, clinical documentation with the electronic health record, making appropriate referrals as documented, and communicating necessary findings to members of the patients care team.  Clayton Bibles, NP 09/18/2021  Pt aware and understands NP's role.

## 2021-09-18 NOTE — Patient Instructions (Addendum)
Continue Albuterol inhaler 2 puffs every 6 hours as needed for shortness of breath or wheezing. Notify if symptoms persist despite rescue inhaler/neb use. Continue Symbicort 2 puffs Twice daily. Brush tongue and rinse mouth afterwards Continue Spiriva 2 puffs daily   Mucinex 600 mg Twice daily for chest congestion Flutter valve 2-3 times per day   Pulmonary function testing scheduled today   Follow up with Dr. Vassie Loll after PFTs. If symptoms do not improve or worsen, please contact office for sooner follow up or seek emergency care.

## 2021-09-18 NOTE — Assessment & Plan Note (Signed)
Stable CD4 counts. Follows with ID.

## 2021-09-23 NOTE — Progress Notes (Signed)
Internal Medicine Clinic Attending  Case discussed with Dr. Nguyen  At the time of the visit.  We reviewed the resident's history and exam and pertinent patient test results.  I agree with the assessment, diagnosis, and plan of care documented in the resident's note. 

## 2021-09-24 ENCOUNTER — Inpatient Hospital Stay: Payer: BC Managed Care – PPO | Admitting: Nurse Practitioner

## 2021-09-27 ENCOUNTER — Other Ambulatory Visit: Payer: Self-pay | Admitting: Student

## 2021-09-29 ENCOUNTER — Other Ambulatory Visit: Payer: Self-pay

## 2021-09-29 DIAGNOSIS — B20 Human immunodeficiency virus [HIV] disease: Secondary | ICD-10-CM

## 2021-09-29 MED ORDER — SYMTUZA 800-150-200-10 MG PO TABS: ORAL_TABLET | ORAL | 5 refills | Status: DC

## 2021-09-29 MED ORDER — TIVICAY 50 MG PO TABS: ORAL_TABLET | ORAL | 5 refills | Status: DC

## 2021-09-30 ENCOUNTER — Other Ambulatory Visit: Payer: Self-pay | Admitting: Family

## 2021-09-30 ENCOUNTER — Ambulatory Visit (INDEPENDENT_AMBULATORY_CARE_PROVIDER_SITE_OTHER): Payer: BC Managed Care – PPO | Admitting: Student

## 2021-09-30 ENCOUNTER — Encounter: Payer: Self-pay | Admitting: Student

## 2021-09-30 VITALS — BP 117/85 | HR 86 | Temp 97.7°F | Wt 176.3 lb

## 2021-09-30 DIAGNOSIS — J439 Emphysema, unspecified: Secondary | ICD-10-CM | POA: Diagnosis not present

## 2021-09-30 DIAGNOSIS — E785 Hyperlipidemia, unspecified: Secondary | ICD-10-CM

## 2021-09-30 DIAGNOSIS — L209 Atopic dermatitis, unspecified: Secondary | ICD-10-CM | POA: Diagnosis not present

## 2021-09-30 DIAGNOSIS — T7840XA Allergy, unspecified, initial encounter: Secondary | ICD-10-CM

## 2021-09-30 DIAGNOSIS — J309 Allergic rhinitis, unspecified: Secondary | ICD-10-CM

## 2021-09-30 DIAGNOSIS — I5022 Chronic systolic (congestive) heart failure: Secondary | ICD-10-CM

## 2021-09-30 DIAGNOSIS — B20 Human immunodeficiency virus [HIV] disease: Secondary | ICD-10-CM

## 2021-09-30 DIAGNOSIS — Z933 Colostomy status: Secondary | ICD-10-CM

## 2021-09-30 DIAGNOSIS — Z87891 Personal history of nicotine dependence: Secondary | ICD-10-CM

## 2021-09-30 HISTORY — DX: Allergy, unspecified, initial encounter: T78.40XA

## 2021-09-30 MED ORDER — ASPIRIN 81 MG PO TBEC: 81.0000 mg | DELAYED_RELEASE_TABLET | Freq: Every day | ORAL | 2 refills | Status: AC

## 2021-09-30 MED ORDER — CETIRIZINE HCL 5 MG PO TABS: 5.0000 mg | ORAL_TABLET | Freq: Every day | ORAL | 0 refills | Status: DC

## 2021-09-30 MED ORDER — TRIAMCINOLONE ACETONIDE 0.025 % EX OINT: TOPICAL_OINTMENT | Freq: Two times a day (BID) | CUTANEOUS | 0 refills | Status: DC

## 2021-09-30 MED ORDER — SYMTUZA 800-150-200-10 MG PO TABS: ORAL_TABLET | ORAL | 5 refills | Status: DC

## 2021-09-30 MED ORDER — TIVICAY 50 MG PO TABS: ORAL_TABLET | ORAL | 5 refills | Status: DC

## 2021-09-30 MED ORDER — HYDROCORTISONE 2.5 % EX LOTN: TOPICAL_LOTION | Freq: Two times a day (BID) | CUTANEOUS | 1 refills | Status: DC

## 2021-09-30 NOTE — Assessment & Plan Note (Addendum)
EF [age] presents in 10/2020 with global hypokinesis of the left ventricle and mildly reduced function of the RV with free wall hypokinesis.  No further work-up was done.  Patient is not on any GDMT.  His echo was concerning for ischemic disease.  I will refer him to cardiology for ischemic work-up.  Patient does not have hypertension so less likely hypertensive heart disease.  Other differentials include postinfectious cardiomyopathy.  Patient has no signs of volume overload on exam.  He is not on GDMT but I will hold off given his low normal blood pressure.  In the meantime we will start patient on aspirin 81 mg.  We will check lipid panel today.  His A1c was 5.9 one month ago.   Addendum LDL 128.  Will start patient on Crestor 20 mg in the setting of high probability for CAD.  Patient agrees with the plan.

## 2021-09-30 NOTE — Assessment & Plan Note (Signed)
Last CD4 count at 104 and HIV viral load of 40.  He is on [place] with prophylaxis daily Bactrim.  Patient told me that the ID clinic sent our refill for his medications but pharmacy states that it was not there.  I will reach out to the ID clinic about this issue.  Addendum Per ID office, his steroid inhaler can potentially interact with his HIV medications.  We will switch him to a LABA/LAMA Anoro Ellipta.  They will send out his medication to mail order pharmacy.

## 2021-09-30 NOTE — Assessment & Plan Note (Signed)
Patient reports 2 weeks of itchy eyes, lacrimation, runny nose and sneezing.  He denies fever or chills or flulike symptoms.  Denies any ear pain.  Denies any sinus issue or sinus pressure sensation.  Patient is unsure of any triggers.  His tympanic membranes normal appearance.  He has mild erythematous of nasal mucosal membrane.  No oral drainage.  No pain to palpation of the frontal and maxillary sinuses.  Together with his atopic dermatitis, this could be seasonal allergies.  No evidence of infection at this time.  Patient will be started on Zyrtec 5 mg daily.

## 2021-09-30 NOTE — Progress Notes (Signed)
CC: Rash and itchy eyes  HPI:  [name] is a 51 y.o. with past medical history of HIV/AIDS, COPD, HFrEF, status post colostomy who presents to the clinic for chief complaint of rash and itchy eyes.  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    Appearance: He is not ill-appearing.  HENT:     Head: Normocephalic.     Nose:     Comments: Erythema of the mucosal membrane.  No active discharge or rhinorrhea Eyes:     General:        Right eye: No discharge.        Left eye: No discharge.     Comments: Conjunctival erythema.  Cardiovascular:     Rate and Rhythm: Normal rate and regular rhythm.     Comments: No JVD, no lower extremity edema Pulmonary:     Effort: Pulmonary effort is normal. No respiratory distress.     Breath sounds: Normal breath sounds. No wheezing.  Abdominal:     Comments: Colostomy bag in the left lower quadrant.  Site appears clean and noninfected.  Skin:     General: Skin is warm.     Comments: Diffuse papules noted in bilateral arms, zygomatic area and posterior neck.  There were several excoriation marks.  These papules mostly concentrated in the antecubital fossa area.  Neurological:     Mental Status: He is alert.       Assessment & Plan:   See Encounters Tab for problem based charting.  Chronic systolic CHF (congestive heart failure) ([name]) EF 45-50 presents in 10/2020 with global hypokinesis of the left ventricle and mildly reduced function of the RV with free wall hypokinesis.  No further work-up was done.  Patient is not on any GDMT.  His echo was concerning for ischemic disease.  I will refer him to cardiology for ischemic work-up.  Patient does not have hypertension so less likely hypertensive heart disease.  Other differentials include postinfectious cardiomyopathy.  Patient has no signs of volume overload on exam.  He is not on GDMT but I will hold off given his low normal blood pressure.  In the meantime we will start patient on aspirin 81 mg.  We will check lipid panel today.  His A1c was 5.9 one month ago.   Addendum LDL 128.  Will start patient  on Crestor 20 mg in the setting of high probability for CAD.  Patient agrees with the plan.   Pulmonary emphysema ([name]) Patient report compliance with his inhalers.  He last saw his pulmonologist and was ordered a PFT.  Addendum Per ID office, his steroid inhaler can potentially interact with his HIV medications.  We will switch him to a LABA/LAMA Anoro Ellipta.  Patient is aware of the change.  S/P colostomy [facility]) Patient will follow-up with Dr. Byrd Parsons for discussed possible colostomy bag reversal.  He has an appointment on 7/20th but in [place].  Patient currently does not have transportation and does not speak [place].  We can try to arrange to see if patient can be seen in [place].  Atopic dermatitis Endorses 1 week of pruritic rash of bilateral arms, face and back.   Denies pain to palpation.  He denies any new body products or new chemical exposure at work.  Physical exam reveals diffuse papules noted in the bilateral arms, zygomatic area and posterior neck.  There were several excoriation marks.  These papules mainly concentrated in the antecubital fossae area.  Given the distribution and characteristic of his rash, suspect this is atopic dermatitis.  Per chart review, patient was prescribed triamcinolone cream by Dr. Ilsa Parsons in May.  Will continue triamcinolone cream for his body and trunk, and hydrocortisone cream for his face for about 4 weeks.  Added Zyrtec to help with his pruritus.  Allergy Patient reports 2 weeks of itchy eyes, lacrimation, runny nose and sneezing.  He denies fever or chills or flulike symptoms.  Denies any ear pain.  Denies any sinus issue or sinus pressure sensation.  Patient is unsure of any triggers.  His tympanic membranes normal appearance.  He has mild erythematous of nasal mucosal membrane.  No oral drainage.  No pain to palpation of the frontal and maxillary sinuses.  Together with his atopic dermatitis, this could be seasonal allergies.  No evidence of infection at this time.  Patient will be started on Zyrtec 5 mg daily.  HIV disease ([name]) Last CD4 count at 104 and HIV viral load of 40.  He is on [place] with prophylaxis daily Bactrim.  Patient told me that the ID clinic sent our refill for his medications but pharmacy states that it was not there.  I will reach out to the ID clinic about this issue.  Addendum Per ID office, his steroid inhaler can potentially interact with his HIV medications.  We will switch him to a LABA/LAMA Anoro Ellipta.  They will send out his medication to mail order pharmacy.   Patient discussed with Dr. Criselda Parsons

## 2021-09-30 NOTE — Assessment & Plan Note (Signed)
Endorses 1 week of pruritic rash of bilateral arms, face and back.  Denies pain to palpation.  He denies any new body products or new chemical exposure at work.  Physical exam reveals diffuse papules noted in the bilateral arms, zygomatic area and posterior neck.  There were several excoriation marks.  These papules mainly concentrated in the antecubital fossae area.  Given the distribution and characteristic of his rash, suspect this is atopic dermatitis.  Per chart review, patient was prescribed triamcinolone cream by Dr. [name] in May.  Will continue triamcinolone cream for his body and trunk, and hydrocortisone cream for his face for about 4 weeks.  Added Zyrtec to help with his pruritus.

## 2021-09-30 NOTE — Assessment & Plan Note (Addendum)
Patient will follow-up with Dr. Byrd Hesselbach for discussed possible colostomy bag reversal.  He has an appointment on 7/20th but in [place].  Patient currently does not have transportation and does not speak [place].  We can try to arrange to see if patient can be seen in [place].

## 2021-09-30 NOTE — Assessment & Plan Note (Signed)
Patient report compliance with his inhalers.  He last saw his pulmonologist and was ordered a PFT.  Addendum Per ID office, his steroid inhaler can potentially interact with his HIV medications.  We will switch him to a LABA/LAMA Anoro Ellipta.  Patient is aware of the change.

## 2021-09-30 NOTE — Patient Instructions (Addendum)
[name],  [name] kliniki leo. Hapa kuna muhtasari wa kile tulichozungumza:  1. Upele wako ni eczema kidogo. Tafadhali tumia TRIAMINOLONE kwa mikono na mgongo wako. Tafadhali tumia HYDROCORTISONE kwa USO wako.  2. Kwa macho yako yanayowasha, hii inawezekana ni mmenyuko wa mzio. Nilikuagiza Zyrtec ambayo unaweza kuchukua kila siku kusaidia.  3. Kwa [address] nitaweka rufaa kwa daktari wa moyo. [place] huo huo, tafadhali anza kutumia aspirin 81 mg kila siku. Pia tutaangalia cholesterol yako leo.  4. Nitafikia kliniki ya VVU kuhusu dawa zako.  5. Dr. Byrd Hesselbach:  Address [facility], 5th [address] [place], [place] [address]  [phone] (FAX)  Tafadhali rudi baada ya miezi 3, mapema ikiwa inahitajika    It was nice seeing you in the clinic today.  Here is a summary what we talked about:  1.  Your rash is slightly eczema.  Please use TRIAMCINOLONE for your arms and back.  Please use HYDROCORTISONE for your FACE.  2.  For your itchy eyes, this is likely an allergic reaction.  I prescribed you Zyrtec which you can take daily to help.  3.  For your heart failure, I will place a referral to a cardiologist.  In the meantime please start taking aspirin 81 mg daily.  We will also check your cholesterol today.  4.  I will reach out to the HIV clinic about your medication.  Please return in 3 months, sooner if needed  Take care  Dr. Cyndie Chime

## 2021-10-01 ENCOUNTER — Telehealth: Payer: Self-pay | Admitting: Nurse Practitioner

## 2021-10-01 LAB — LIPID PANEL
Chol/HDL Ratio: 4.2 ratio (ref 0.0–5.0)
Cholesterol, Total: 194 mg/dL (ref 100–199)
HDL: 46 mg/dL (ref >39)
LDL Chol Calc (NIH): 128 mg/dL — ABNORMAL HIGH (ref 0–99)
Triglycerides: 110 mg/dL (ref 0–149)
VLDL Cholesterol Cal: 20 mg/dL (ref 5–40)

## 2021-10-01 MED ORDER — ANORO ELLIPTA 62.5-25 MCG/ACT IN AEPB: 1.0000 | INHALATION_SPRAY | Freq: Every day | RESPIRATORY_TRACT | 2 refills | Status: DC

## 2021-10-01 MED ORDER — ROSUVASTATIN CALCIUM 20 MG PO TABS: 20.0000 mg | ORAL_TABLET | Freq: Every day | ORAL | 11 refills | Status: DC

## 2021-10-01 NOTE — Progress Notes (Signed)
Internal Medicine Clinic Attending  Case discussed with Dr. Nguyen  at the time of the visit.  We reviewed the resident's history and exam and pertinent patient test results.  I agree with the assessment, diagnosis, and plan of care documented in the resident's note.  

## 2021-10-01 NOTE — Addendum Note (Signed)
Addended byDoran Stabler on: 10/01/2021 08:35 AM   Modules accepted: Orders

## 2021-10-02 NOTE — Telephone Encounter (Signed)
Called and spoke with patient. He verbalized understanding. ? ?Nothing further needed at time of call.  ?

## 2021-10-24 ENCOUNTER — Other Ambulatory Visit: Payer: Self-pay | Admitting: Student

## 2021-10-29 ENCOUNTER — Telehealth: Payer: Self-pay

## 2021-10-29 NOTE — Telephone Encounter (Signed)
Patient here as a walk-in, unable to get his [place] or [place] from the pharmacy. [facility].   We called [facility] together with an interpreter ([employer] [phone] Leonette Most) and scheduled delivery for his ART tomorrow 8/17 at his home address.  He has been without his medications for about one month and has noticed his skin rashes have worsened during that time and he is having trouble sleeping due to the itching. He would like to be seen by a provider as soon as possible and accepts appointment with Rexene Alberts, NP on Friday 8/18.   Sandie Ano, RN

## 2021-10-31 ENCOUNTER — Ambulatory Visit: Payer: BC Managed Care – PPO | Admitting: Infectious Diseases

## 2021-11-10 ENCOUNTER — Emergency Department (HOSPITAL_COMMUNITY)
Admission: EM | Admit: 2021-11-10 | Discharge: 2021-11-11 | Disposition: A | Discharge status: Home / Self Care | Payer: BC Managed Care – PPO | Attending: Emergency Medicine | Admitting: Emergency Medicine

## 2021-11-10 ENCOUNTER — Other Ambulatory Visit: Payer: Self-pay

## 2021-11-10 ENCOUNTER — Encounter (HOSPITAL_COMMUNITY): Payer: Self-pay

## 2021-11-10 DIAGNOSIS — L299 Pruritus, unspecified: Secondary | ICD-10-CM | POA: Diagnosis present

## 2021-11-10 DIAGNOSIS — Z21 Asymptomatic human immunodeficiency virus [HIV] infection status: Secondary | ICD-10-CM | POA: Diagnosis not present

## 2021-11-10 DIAGNOSIS — N189 Chronic kidney disease, unspecified: Secondary | ICD-10-CM | POA: Insufficient documentation

## 2021-11-10 DIAGNOSIS — Z7982 Long term (current) use of aspirin: Secondary | ICD-10-CM | POA: Diagnosis not present

## 2021-11-10 DIAGNOSIS — I5022 Chronic systolic (congestive) heart failure: Secondary | ICD-10-CM | POA: Insufficient documentation

## 2021-11-10 DIAGNOSIS — J449 Chronic obstructive pulmonary disease, unspecified: Secondary | ICD-10-CM | POA: Insufficient documentation

## 2021-11-10 DIAGNOSIS — E1122 Type 2 diabetes mellitus with diabetic chronic kidney disease: Secondary | ICD-10-CM | POA: Insufficient documentation

## 2021-11-10 DIAGNOSIS — Z7952 Long term (current) use of systemic steroids: Secondary | ICD-10-CM | POA: Diagnosis not present

## 2021-11-10 DIAGNOSIS — L209 Atopic dermatitis, unspecified: Secondary | ICD-10-CM | POA: Insufficient documentation

## 2021-11-10 NOTE — ED Notes (Signed)
Not in bed at this time 

## 2021-11-10 NOTE — ED Triage Notes (Addendum)
Complains of itching x 2 weeks. Appears to have eczema or contact dermatitis. Patient has hx of HIV, TB and hepatitis.

## 2021-11-11 LAB — URINALYSIS, ROUTINE W REFLEX MICROSCOPIC
Bilirubin Urine: NEGATIVE
Glucose, UA: NEGATIVE mg/dL
Ketones, ur: NEGATIVE mg/dL
Leukocytes,Ua: NEGATIVE
Nitrite: NEGATIVE
Protein, ur: 30 mg/dL — AB
Specific Gravity, Urine: 1.013 (ref 1.005–1.030)
pH: 6 (ref 5.0–8.0)

## 2021-11-11 LAB — CBC WITH DIFFERENTIAL/PLATELET
Abs Immature Granulocytes: 0.05 10*3/uL (ref 0.00–0.07)
Basophils Absolute: 0 10*3/uL (ref 0.0–0.1)
Basophils Relative: 0 %
Eosinophils Absolute: 0.2 10*3/uL (ref 0.0–0.5)
Eosinophils Relative: 4 %
HCT: 41.2 % (ref 39.0–52.0)
Hemoglobin: 13.6 g/dL (ref 13.0–17.0)
Immature Granulocytes: 1 %
Lymphocytes Relative: 18 %
Lymphs Abs: 0.9 10*3/uL (ref 0.7–4.0)
MCH: 32.8 pg (ref 26.0–34.0)
MCHC: 33 g/dL (ref 30.0–36.0)
MCV: 99.3 fL (ref 80.0–100.0)
Monocytes Absolute: 0.5 10*3/uL (ref 0.1–1.0)
Monocytes Relative: 10 %
Neutro Abs: 3.5 10*3/uL (ref 1.7–7.7)
Neutrophils Relative %: 67 %
Platelets: 182 10*3/uL (ref 150–400)
RBC: 4.15 MIL/uL — ABNORMAL LOW (ref 4.22–5.81)
RDW: 12.7 % (ref 11.5–15.5)
WBC: 5.2 10*3/uL (ref 4.0–10.5)
nRBC: 0 % (ref 0.0–0.2)

## 2021-11-11 LAB — BASIC METABOLIC PANEL
Anion gap: 8 (ref 5–15)
BUN: 10 mg/dL (ref 6–20)
CO2: 29 mmol/L (ref 22–32)
Calcium: 9 mg/dL (ref 8.9–10.3)
Chloride: 102 mmol/L (ref 98–111)
Creatinine, Ser: 1.03 mg/dL (ref 0.61–1.24)
GFR, Estimated: >60 mL/min (ref >60)
Glucose, Bld: 110 mg/dL — ABNORMAL HIGH (ref 70–99)
Potassium: 3.6 mmol/L (ref 3.5–5.1)
Sodium: 139 mmol/L (ref 135–145)

## 2021-11-11 LAB — RPR: RPR Ser Ql: NONREACTIVE

## 2021-11-11 MED ORDER — PREDNISONE 10 MG PO TABS: 20.0000 mg | ORAL_TABLET | Freq: Every day | ORAL | 0 refills | Status: DC

## 2021-11-11 MED ORDER — METHYLPREDNISOLONE SODIUM SUCC 125 MG IJ SOLR
125.0000 mg | Freq: Once | INTRAMUSCULAR | Status: AC
  Administered 2021-11-11: 125 mg via INTRAMUSCULAR
  Filled 2021-11-11: qty 2

## 2021-11-11 NOTE — ED Notes (Signed)
C/o itching all over. Denies any other symptoms at this time

## 2021-11-11 NOTE — ED Provider Notes (Signed)
[facility] EMERGENCY DEPARTMENT Provider Note   CSN: [id number] Arrival date & time: 11/10/21  1725     History  Chief Complaint  Patient presents with   Pruritis    [name] is a 51 y.o. male.  HPI   Patient with medical history including type 2 diabetes, HIV/AIDS well controlled, chronic systolic heart failure, emphysema, CKD, COPD presents  with complaints of pruritus.  Patient states been going on for the last month, states he feels itchy all over his body, states this has been constant, not isolated to any 1 area, denies any tongue throat lip swelling difficulty breathing no GI symptoms, no environmental changes no new medications, no new detergent, he has no associated rash, no fevers no chills, states that he has been applying moisturizer to his skin without much relief.  He has no other complaints.  Recently seen by his primary care physician, edition the patient has atopic dermatitis, was prescribed steroid cream and was placed on Zyrtec.  Last CD4 was 104 HIV viral load is 40, currently on symtuza   Home Medications Prior to Admission medications   Medication Sig Start Date End Date Taking? Authorizing Provider  predniSONE (DELTASONE) 10 MG tablet Take 2 tablets (20 mg total) by mouth daily for 5 days. 11/11/21 11/16/21 Yes Marcello Fennel, PA-C  albuterol (VENTOLIN HFA) 108 (90 Base) MCG/ACT inhaler Inhale 2 puffs into the lungs every 6 (six) hours as needed for wheezing or shortness of breath. 03/27/21   Prosperi, Christian H, PA-C  aspirin EC 81 MG tablet Take 1 tablet (81 mg total) by mouth daily. Swallow whole. 09/30/21 09/30/22  Gaylan Gerold, DO  cetirizine (ZYRTEC) 5 MG tablet Take 1 tablet (5 mg total) by mouth daily. 09/30/21 10/30/21  Gaylan Gerold, DO  Darunavir-Cobicistat-Emtricitabine-Tenofovir Alafenamide (SYMTUZA) 800-150-200-10 MG TABS TAKE 1 TABLET BY MOUTH 1 TIME A DAY WITH BREAKFAST 09/30/21   Golden Circle, FNP  dolutegravir  (TIVICAY) 50 MG tablet TAKE ONE TABLET BY MOUTH ONCE DAILY. STORE AT ROOM TEMPERATURE 09/30/21   Golden Circle, FNP  guaiFENesin (ROBITUSSIN) 100 MG/5ML liquid Take 5 mLs by mouth every 4 (four) hours. Patient taking differently: Take 5 mLs by mouth every 4 (four) hours as needed for cough or to loosen phlegm. 08/04/21   Carlyle Basques, MD  hydrocortisone 2.5 % lotion Apply topically 2 (two) times daily. Apply to affected area 2 times daily 09/30/21   Gaylan Gerold, DO  ibuprofen (ADVIL) 200 MG tablet Take 400 mg by mouth every 6 (six) hours as needed for mild pain, fever or headache.    [provider]  OXYGEN Inhale 2 L into the lungs continuous.    [provider]  rosuvastatin (CRESTOR) 20 MG tablet Take 1 tablet (20 mg total) by mouth daily. 10/01/21 10/01/22  Gaylan Gerold, DO  sulfamethoxazole-trimethoprim (BACTRIM DS) 800-160 MG tablet Take 1 tablet by mouth daily. Patient taking differently: Take 1 tablet by mouth daily. Continuously 08/25/21 11/23/21  Timothy Lasso, MD  triamcinolone oint 0.025%-Cerave equivalent cream 1:1 mixture Apply topically 2 (two) times daily. 09/30/21   Gaylan Gerold, DO  umeclidinium-vilanterol [facility] ELLIPTA) 62.5-25 MCG/ACT AEPB Inhale 1 puff into the lungs daily. 10/01/21   Gaylan Gerold, DO  Vitamin D, Ergocalciferol, (DRISDOL) 1.25 MG (50000 UNIT) CAPS capsule TAKE 1 CAPSULE BY MOUTH EVERY 7 DAYS 10/24/21   Rick Duff, MD      Allergies    Metformin and related    Review of Systems  Review of Systems  Constitutional:  Negative for chills and fever.  Respiratory:  Negative for shortness of breath.   Cardiovascular:  Negative for chest pain.  Gastrointestinal:  Negative for abdominal pain.  Skin:        Itchiness   Neurological:  Negative for headaches.    Physical Exam Updated Vital Signs BP (!) 140/81 (BP Location: Left Arm)   Pulse (!) 103   Temp 98.6 F (37 C) (Oral)   Resp 16   SpO2 93%  Physical Exam Vitals and  nursing note reviewed.  Constitutional:      General: He is not in acute distress.    Appearance: He is not ill-appearing.  HENT:     Head: Normocephalic and atraumatic.     Nose: No congestion.     Mouth/Throat:     Mouth: Mucous membranes are moist.     Pharynx: Oropharynx is clear. No oropharyngeal exudate or posterior oropharyngeal erythema.     Comments: No trismus no torticollis no oral edema present, tongue uvula midline controlling her secretions tonsils both equal symmetric bilaterally, no submandibular swelling, no oral lesions present. Eyes:     Conjunctiva/sclera: Conjunctivae normal.  Cardiovascular:     Rate and Rhythm: Normal rate and regular rhythm.     Pulses: Normal pulses.     Heart sounds: No murmur heard.    No friction rub. No gallop.  Pulmonary:     Effort: No respiratory distress.     Breath sounds: No wheezing, rhonchi or rales.  Abdominal:     Palpations: Abdomen is soft.     Tenderness: There is no abdominal tenderness. There is no right CVA tenderness or left CVA tenderness.     Comments: Colostomy present, no evidence of infection, no melena or hematochezia, light tannish stool, abdomen soft nontender, nondistended  Skin:    General: Skin is warm and dry.     Comments: Patient has noted dry skin noted all over his body, has slight papule like rash on the flexor aspect of his upper extremities, no lesions seen on patient's palms or soles of his feet, no evidence of infection  Neurological:     Mental Status: He is alert.  Psychiatric:        Mood and Affect: Mood normal.     ED Results / Procedures / Treatments   Labs (all labs ordered are listed, but only abnormal results are displayed) Labs Reviewed  BASIC METABOLIC PANEL - Abnormal; Notable for the following components:      Result Value   Glucose, Bld 110 (*)    All other components within normal limits  CBC WITH DIFFERENTIAL/PLATELET - Abnormal; Notable for the following components:   RBC  4.15 (*)    All other components within normal limits  URINALYSIS, ROUTINE W REFLEX MICROSCOPIC - Abnormal; Notable for the following components:   Hgb urine dipstick SMALL (*)    Protein, ur 30 (*)    Bacteria, UA RARE (*)    All other components within normal limits  RPR  GC/CHLAMYDIA PROBE AMP ([place]) NOT AT [facility]    EKG None  Radiology No results found.  Procedures Procedures    Medications Ordered in ED Medications  methylPREDNISolone sodium succinate (SOLU-MEDROL) 125 mg/2 mL injection 125 mg (125 mg Intramuscular Given 11/11/21 0231)    ED Course/ Medical Decision Making/ A&P  Medical Decision Making Amount and/or Complexity of Data Reviewed Labs: ordered.  Risk Prescription drug management.   This patient presents to the ED for concern of rash, this involves an extensive number of treatment options, and is a complaint that carries with it a high risk of complications and morbidity.  The differential diagnosis includes cellulitis, T EN, Stevens-Johnson's, anaphylaxis, [place] spotted fever, syphilis    Additional history obtained:  Additional history obtained from N/A External records from outside source obtained and reviewed including internal medicine notes   Co morbidities that complicate the patient evaluation  HIV,  Social Determinants of Health:  Non-English speaker    Lab Tests:  I Ordered, and personally interpreted labs.  The pertinent results include: CBC unremarkable, BMP is unremarkable, UA is unremarkable, GC chlamydia pending, syphilis pending   Imaging Studies ordered:  I ordered imaging studies including N/A I independently visualized and interpreted imaging which showed N/A I agree with the radiologist interpretation   Cardiac Monitoring:  The patient was maintained on a cardiac monitor.  I personally viewed and interpreted the cardiac monitored which showed an underlying rhythm of:  N/A   Medicines ordered and prescription drug management:  I ordered medication including steroids I have reviewed the patients home medicines and have made adjustments as needed  Critical Interventions:  N/A   Reevaluation:  Presents with a rash, physical exam was benign, he was slightly tachycardic on arrival, still has a slightly elevated heart rates due to his immunocompromise state will obtain basic lab work for further evaluation  Lab work is unremarkable, patient has no complaints, nontoxic-appearing, he is agreement with plan discharge at this time.   Consultations Obtained:  N/A   Test Considered:  N/A    Rule out Low suspicion for angioedema or anaphylaxis vital signs reassuring, no systemic rash no GI symptoms presentation is atypical of etiology.  I have low suspicion for TENS or Stevens-Johnson's no skin sloughing, no oral lesions.  I have low suspicion for [facility] spotted fever presentation atypical.  I doubt disseminated gonorrhea or syphilis not endorsing any testicular pain or penile discharge, he has no oral lesions, there is also no noted rashes on the hands or soles.  I doubt this is infectious as there is no evidence of infection present my exam.  It is noted that patient has an elevated heart rate, but after reviewing his chart he typically has a high heart rate anywhere between 90-100, likely this is his baseline and I doubt infectious etiology, I also have low suspicion for PE denies any pleuritic chest pain or shortness of breath, nonhypoxic, nonhypoxic, afebrile, presentation is atypical  of etiology.    Dispostion and problem list  After consideration of the diagnostic results and the patients response to treatment, I feel that the patent would benefit from discharge.  Rash-suspect contact dermatitis, will try with a short course of steroids, have him follow-up with his internal medicine doctor for further evaluation and strict return  precautions.            Final Clinical Impression(s) / ED Diagnoses Final diagnoses:  Atopic dermatitis, unspecified type    Rx / DC Orders ED Discharge Orders          Ordered    predniSONE (DELTASONE) 10 MG tablet  Daily        11/11/21 0512              Carroll Sage, PA-C 11/11/21 0518    Preston Fleeting,  Onalee Hua, MD 11/11/21 [phone]

## 2021-11-11 NOTE — Discharge Instructions (Addendum)
Suspect your rash is inflammatory, started you on steroids please take as prescribed, I want to continue taking Zyrtec as this should help with itchiness, please take cool baths, and moisturize your skin.  Come back to the emergency department if you develop any fevers chills tongue throat lip swelling difficulty breathing as you will need further evaluation.  Please follow-up with your PCP for reassessment Dr. Marolyn Haller

## 2021-11-13 ENCOUNTER — Ambulatory Visit: Payer: BC Managed Care – PPO | Attending: Cardiology | Admitting: Cardiology

## 2021-11-15 ENCOUNTER — Emergency Department (HOSPITAL_COMMUNITY): Payer: BC Managed Care – PPO

## 2021-11-15 ENCOUNTER — Inpatient Hospital Stay (HOSPITAL_COMMUNITY)
Admission: EM | Admit: 2021-11-15 | Discharge: 2021-11-19 | DRG: 975 | Disposition: A | Discharge status: Home / Self Care | Payer: BC Managed Care – PPO | Attending: Internal Medicine | Admitting: Internal Medicine

## 2021-11-15 ENCOUNTER — Other Ambulatory Visit: Payer: Self-pay

## 2021-11-15 DIAGNOSIS — Z933 Colostomy status: Secondary | ICD-10-CM | POA: Diagnosis not present

## 2021-11-15 DIAGNOSIS — N1832 Chronic kidney disease, stage 3b: Secondary | ICD-10-CM | POA: Diagnosis present

## 2021-11-15 DIAGNOSIS — J441 Chronic obstructive pulmonary disease with (acute) exacerbation: Secondary | ICD-10-CM | POA: Diagnosis present

## 2021-11-15 DIAGNOSIS — J479 Bronchiectasis, uncomplicated: Secondary | ICD-10-CM

## 2021-11-15 DIAGNOSIS — R051 Acute cough: Principal | ICD-10-CM

## 2021-11-15 DIAGNOSIS — N179 Acute kidney failure, unspecified: Secondary | ICD-10-CM | POA: Diagnosis present

## 2021-11-15 DIAGNOSIS — J439 Emphysema, unspecified: Secondary | ICD-10-CM | POA: Diagnosis present

## 2021-11-15 DIAGNOSIS — E785 Hyperlipidemia, unspecified: Secondary | ICD-10-CM

## 2021-11-15 DIAGNOSIS — I5082 Biventricular heart failure: Secondary | ICD-10-CM | POA: Diagnosis present

## 2021-11-15 DIAGNOSIS — A329 Listeriosis, unspecified: Secondary | ICD-10-CM

## 2021-11-15 DIAGNOSIS — Z86711 Personal history of pulmonary embolism: Secondary | ICD-10-CM | POA: Diagnosis not present

## 2021-11-15 DIAGNOSIS — J189 Pneumonia, unspecified organism: Secondary | ICD-10-CM | POA: Diagnosis present

## 2021-11-15 DIAGNOSIS — E559 Vitamin D deficiency, unspecified: Secondary | ICD-10-CM | POA: Diagnosis present

## 2021-11-15 DIAGNOSIS — A419 Sepsis, unspecified organism: Secondary | ICD-10-CM

## 2021-11-15 DIAGNOSIS — Z20822 Contact with and (suspected) exposure to covid-19: Secondary | ICD-10-CM | POA: Diagnosis present

## 2021-11-15 DIAGNOSIS — L209 Atopic dermatitis, unspecified: Secondary | ICD-10-CM | POA: Diagnosis present

## 2021-11-15 DIAGNOSIS — J470 Bronchiectasis with acute lower respiratory infection: Secondary | ICD-10-CM | POA: Diagnosis present

## 2021-11-15 DIAGNOSIS — Z87891 Personal history of nicotine dependence: Secondary | ICD-10-CM | POA: Diagnosis not present

## 2021-11-15 DIAGNOSIS — B20 Human immunodeficiency virus [HIV] disease: Secondary | ICD-10-CM

## 2021-11-15 DIAGNOSIS — Z8611 Personal history of tuberculosis: Secondary | ICD-10-CM | POA: Diagnosis not present

## 2021-11-15 DIAGNOSIS — Z8249 Family history of ischemic heart disease and other diseases of the circulatory system: Secondary | ICD-10-CM

## 2021-11-15 DIAGNOSIS — J988 Other specified respiratory disorders: Secondary | ICD-10-CM | POA: Diagnosis not present

## 2021-11-15 DIAGNOSIS — Z79899 Other long term (current) drug therapy: Secondary | ICD-10-CM

## 2021-11-15 DIAGNOSIS — E1122 Type 2 diabetes mellitus with diabetic chronic kidney disease: Secondary | ICD-10-CM | POA: Diagnosis present

## 2021-11-15 DIAGNOSIS — Z86718 Personal history of other venous thrombosis and embolism: Secondary | ICD-10-CM

## 2021-11-15 DIAGNOSIS — E860 Dehydration: Secondary | ICD-10-CM | POA: Diagnosis present

## 2021-11-15 DIAGNOSIS — R7881 Bacteremia: Secondary | ICD-10-CM

## 2021-11-15 DIAGNOSIS — Z888 Allergy status to other drugs, medicaments and biological substances status: Secondary | ICD-10-CM | POA: Diagnosis not present

## 2021-11-15 DIAGNOSIS — I889 Nonspecific lymphadenitis, unspecified: Secondary | ICD-10-CM | POA: Diagnosis not present

## 2021-11-15 DIAGNOSIS — Z603 Acculturation difficulty: Secondary | ICD-10-CM | POA: Diagnosis present

## 2021-11-15 DIAGNOSIS — R590 Localized enlarged lymph nodes: Secondary | ICD-10-CM | POA: Diagnosis present

## 2021-11-15 DIAGNOSIS — R6889 Other general symptoms and signs: Secondary | ICD-10-CM

## 2021-11-15 DIAGNOSIS — A3289 Other forms of listeriosis: Secondary | ICD-10-CM | POA: Diagnosis not present

## 2021-11-15 LAB — COMPREHENSIVE METABOLIC PANEL
ALT: 47 U/L — ABNORMAL HIGH (ref 0–44)
AST: 93 U/L — ABNORMAL HIGH (ref 15–41)
Albumin: 2.5 g/dL — ABNORMAL LOW (ref 3.5–5.0)
Alkaline Phosphatase: 46 U/L (ref 38–126)
Anion gap: 18 — ABNORMAL HIGH (ref 5–15)
BUN: 24 mg/dL — ABNORMAL HIGH (ref 6–20)
CO2: 22 mmol/L (ref 22–32)
Calcium: 8.7 mg/dL — ABNORMAL LOW (ref 8.9–10.3)
Chloride: 98 mmol/L (ref 98–111)
Creatinine, Ser: 1.61 mg/dL — ABNORMAL HIGH (ref 0.61–1.24)
GFR, Estimated: 52 mL/min — ABNORMAL LOW (ref >60)
Glucose, Bld: 148 mg/dL — ABNORMAL HIGH (ref 70–99)
Potassium: 3.2 mmol/L — ABNORMAL LOW (ref 3.5–5.1)
Sodium: 138 mmol/L (ref 135–145)
Total Bilirubin: 1.2 mg/dL (ref 0.3–1.2)
Total Protein: 7.7 g/dL (ref 6.5–8.1)

## 2021-11-15 LAB — CBC WITH DIFFERENTIAL/PLATELET
Abs Immature Granulocytes: 0.08 10*3/uL — ABNORMAL HIGH (ref 0.00–0.07)
Basophils Absolute: 0 10*3/uL (ref 0.0–0.1)
Basophils Relative: 0 %
Eosinophils Absolute: 0 10*3/uL (ref 0.0–0.5)
Eosinophils Relative: 0 %
HCT: 41.1 % (ref 39.0–52.0)
Hemoglobin: 13.6 g/dL (ref 13.0–17.0)
Immature Granulocytes: 1 %
Lymphocytes Relative: 12 %
Lymphs Abs: 1 10*3/uL (ref 0.7–4.0)
MCH: 33.3 pg (ref 26.0–34.0)
MCHC: 33.1 g/dL (ref 30.0–36.0)
MCV: 100.7 fL — ABNORMAL HIGH (ref 80.0–100.0)
Monocytes Absolute: 1 10*3/uL (ref 0.1–1.0)
Monocytes Relative: 11 %
Neutro Abs: 6.5 10*3/uL (ref 1.7–7.7)
Neutrophils Relative %: 76 %
Platelets: 154 10*3/uL (ref 150–400)
RBC: 4.08 MIL/uL — ABNORMAL LOW (ref 4.22–5.81)
RDW: 12.7 % (ref 11.5–15.5)
WBC: 8.6 10*3/uL (ref 4.0–10.5)
nRBC: 0 % (ref 0.0–0.2)

## 2021-11-15 LAB — PROTIME-INR
INR: 1.1 (ref 0.8–1.2)
Prothrombin Time: 14.1 seconds (ref 11.4–15.2)

## 2021-11-15 LAB — LACTIC ACID, PLASMA
Lactic Acid, Venous: 1.6 mmol/L (ref 0.5–1.9)
Lactic Acid, Venous: 1.7 mmol/L (ref 0.5–1.9)

## 2021-11-15 LAB — VITAMIN D 25 HYDROXY (VIT D DEFICIENCY, FRACTURES): Vit D, 25-Hydroxy: 24.25 ng/mL — ABNORMAL LOW (ref 30–100)

## 2021-11-15 LAB — TROPONIN I (HIGH SENSITIVITY)
Troponin I (High Sensitivity): 25 ng/L — ABNORMAL HIGH (ref <18)
Troponin I (High Sensitivity): 19 ng/L — ABNORMAL HIGH (ref <18)

## 2021-11-15 LAB — RESP PANEL BY RT-PCR (FLU A&B, COVID) ARPGX2
Influenza A by PCR: NEGATIVE
Influenza B by PCR: NEGATIVE
SARS Coronavirus 2 by RT PCR: NEGATIVE

## 2021-11-15 LAB — GLUCOSE, CAPILLARY: Glucose-Capillary: 197 mg/dL — ABNORMAL HIGH (ref 70–99)

## 2021-11-15 LAB — MAGNESIUM: Magnesium: 2 mg/dL (ref 1.7–2.4)

## 2021-11-15 MED ORDER — SODIUM CHLORIDE 0.9 % IV SOLN
1.0000 g | Freq: Once | INTRAVENOUS | Status: AC
  Administered 2021-11-15: 1 g via INTRAVENOUS
  Filled 2021-11-15: qty 10

## 2021-11-15 MED ORDER — SENNOSIDES-DOCUSATE SODIUM 8.6-50 MG PO TABS: 1.0000 | ORAL_TABLET | Freq: Every evening | ORAL | Status: DC | PRN

## 2021-11-15 MED ORDER — SODIUM CHLORIDE 0.9 % IV SOLN: 500.0000 mg | INTRAVENOUS | Status: DC

## 2021-11-15 MED ORDER — SULFAMETHOXAZOLE-TRIMETHOPRIM 800-160 MG PO TABS
1.0000 | ORAL_TABLET | Freq: Every day | ORAL | Status: DC
  Administered 2021-11-15 – 2021-11-18 (×4): 1 via ORAL
  Filled 2021-11-15 (×4): qty 1

## 2021-11-15 MED ORDER — LACTATED RINGERS IV BOLUS
1000.0000 mL | Freq: Once | INTRAVENOUS | Status: AC
  Administered 2021-11-15: 1000 mL via INTRAVENOUS

## 2021-11-15 MED ORDER — STERILE WATER FOR INJECTION IJ SOLN
INTRAMUSCULAR | Status: AC
  Administered 2021-11-15: 10 mL
  Filled 2021-11-15: qty 10

## 2021-11-15 MED ORDER — LACTATED RINGERS IV SOLN: INTRAVENOUS | Status: AC

## 2021-11-15 MED ORDER — METHYLPREDNISOLONE SODIUM SUCC 125 MG IJ SOLR
125.0000 mg | Freq: Once | INTRAMUSCULAR | Status: AC
  Administered 2021-11-15: 125 mg via INTRAVENOUS
  Filled 2021-11-15: qty 2

## 2021-11-15 MED ORDER — TRIAMCINOLONE ACETONIDE 0.1 % EX CREA: TOPICAL_CREAM | Freq: Two times a day (BID) | CUTANEOUS | Status: DC | PRN

## 2021-11-15 MED ORDER — DARUN-COBIC-EMTRICIT-TENOFAF 800-150-200-10 MG PO TABS
1.0000 | ORAL_TABLET | Freq: Every day | ORAL | Status: DC
  Administered 2021-11-16 – 2021-11-19 (×4): 1 via ORAL
  Filled 2021-11-15 (×4): qty 1

## 2021-11-15 MED ORDER — ACETAMINOPHEN 325 MG PO TABS
650.0000 mg | ORAL_TABLET | Freq: Once | ORAL | Status: AC
  Administered 2021-11-15: 650 mg via ORAL
  Filled 2021-11-15: qty 2

## 2021-11-15 MED ORDER — ACETAMINOPHEN 325 MG PO TABS: 650.0000 mg | ORAL_TABLET | Freq: Four times a day (QID) | ORAL | Status: DC | PRN

## 2021-11-15 MED ORDER — HYDROXYZINE HCL 10 MG PO TABS
10.0000 mg | ORAL_TABLET | Freq: Three times a day (TID) | ORAL | Status: DC | PRN
Start: 2021-11-15 — End: 2021-11-19

## 2021-11-15 MED ORDER — IPRATROPIUM-ALBUTEROL 0.5-2.5 (3) MG/3ML IN SOLN: 3.0000 mL | Freq: Four times a day (QID) | RESPIRATORY_TRACT | Status: DC | PRN

## 2021-11-15 MED ORDER — GUAIFENESIN 100 MG/5ML PO LIQD
5.0000 mL | ORAL | Status: DC | PRN
Start: 2021-11-15 — End: 2021-11-19

## 2021-11-15 MED ORDER — SODIUM CHLORIDE 0.9 % IV SOLN
2.0000 g | Freq: Three times a day (TID) | INTRAVENOUS | Status: DC
  Administered 2021-11-16 (×2): 2 g via INTRAVENOUS
  Filled 2021-11-15 (×2): qty 12.5

## 2021-11-15 MED ORDER — ACETAMINOPHEN 650 MG RE SUPP: 650.0000 mg | Freq: Four times a day (QID) | RECTAL | Status: DC | PRN

## 2021-11-15 MED ORDER — ONDANSETRON HCL 4 MG PO TABS: 4.0000 mg | ORAL_TABLET | Freq: Four times a day (QID) | ORAL | Status: DC | PRN

## 2021-11-15 MED ORDER — ENOXAPARIN SODIUM 40 MG/0.4ML IJ SOSY
40.0000 mg | PREFILLED_SYRINGE | INTRAMUSCULAR | Status: DC
  Administered 2021-11-15 – 2021-11-18 (×4): 40 mg via SUBCUTANEOUS
  Filled 2021-11-15 (×4): qty 0.4

## 2021-11-15 MED ORDER — UMECLIDINIUM-VILANTEROL 62.5-25 MCG/ACT IN AEPB
1.0000 | INHALATION_SPRAY | Freq: Every day | RESPIRATORY_TRACT | Status: DC
  Administered 2021-11-16 – 2021-11-19 (×4): 1 via RESPIRATORY_TRACT
  Filled 2021-11-15: qty 14

## 2021-11-15 MED ORDER — ONDANSETRON HCL 4 MG/2ML IJ SOLN
4.0000 mg | Freq: Four times a day (QID) | INTRAMUSCULAR | Status: DC | PRN
  Administered 2021-11-16: 4 mg via INTRAVENOUS
  Filled 2021-11-15: qty 2

## 2021-11-15 MED ORDER — INSULIN ASPART 100 UNIT/ML IJ SOLN
0.0000 [IU] | Freq: Three times a day (TID) | INTRAMUSCULAR | Status: DC
  Administered 2021-11-16: 5 [IU] via SUBCUTANEOUS
  Administered 2021-11-16: 7 [IU] via SUBCUTANEOUS

## 2021-11-15 MED ORDER — SODIUM CHLORIDE 0.9 % IV SOLN
500.0000 mg | Freq: Once | INTRAVENOUS | Status: AC
  Administered 2021-11-15: 500 mg via INTRAVENOUS
  Filled 2021-11-15: qty 5

## 2021-11-15 MED ORDER — DOLUTEGRAVIR SODIUM 50 MG PO TABS
50.0000 mg | ORAL_TABLET | Freq: Every day | ORAL | Status: DC
  Administered 2021-11-16 – 2021-11-19 (×4): 50 mg via ORAL
  Filled 2021-11-15 (×4): qty 1

## 2021-11-15 MED ORDER — ASPIRIN 81 MG PO TBEC
81.0000 mg | DELAYED_RELEASE_TABLET | Freq: Every day | ORAL | Status: DC
  Administered 2021-11-16 – 2021-11-19 (×4): 81 mg via ORAL
  Filled 2021-11-15 (×4): qty 1

## 2021-11-15 MED ORDER — IPRATROPIUM-ALBUTEROL 0.5-2.5 (3) MG/3ML IN SOLN
3.0000 mL | Freq: Once | RESPIRATORY_TRACT | Status: AC
  Administered 2021-11-15: 3 mL via RESPIRATORY_TRACT
  Filled 2021-11-15: qty 3

## 2021-11-15 MED ORDER — METOCLOPRAMIDE HCL 5 MG/ML IJ SOLN
10.0000 mg | Freq: Once | INTRAMUSCULAR | Status: AC
  Administered 2021-11-15: 10 mg via INTRAVENOUS
  Filled 2021-11-15: qty 2

## 2021-11-15 MED ORDER — ROSUVASTATIN CALCIUM 20 MG PO TABS
20.0000 mg | ORAL_TABLET | Freq: Every day | ORAL | Status: DC
  Administered 2021-11-16 – 2021-11-19 (×4): 20 mg via ORAL
  Filled 2021-11-15 (×4): qty 1

## 2021-11-15 NOTE — ED Notes (Signed)
Patient transported to CT 

## 2021-11-15 NOTE — H&P (Cosign Needed Addendum)
Date: 11/15/2021               Patient Name:  [name] MRN: [id number]  DOB: [birth year] Age / Sex: 51 y.o., male   PCP: Marolyn Haller, MD         Medical Service: Internal Medicine Teaching Service         Attending Physician: Dr. Rondel Baton, MD    First Contact: Dr. Modena Slater Pager: [phone]  Second Contact: Dr. Sharrell Ku Pager: [phone]       After Hours (After 5p/  First Contact Pager: [phone]  weekends / holidays): Second Contact Pager: [phone]   Chief Complaint: Nonproductive cough, malaise  History of Present Illness:  Mr. 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 Patient assessed at bedside in the ED with [name] interpretor.  Patient reports that while walking yesterday, he felt generalized weakness and pain all over his body and almost collapsed.  He reports nonproductive cough with associated shortness of breath.  He reports vomiting 3 times yesterday but no vomiting or abdominal pain today. He reports feeling dizzy when he stands up to walk.  States he has some back pain with movement but none at rest. He also reports feeling chills, headache and dry throat but denies any fevers, chest pain, palpitations, diarrhea, constipation, leg pain or new rash.   Of note, patient reports taking all his medications and has not missed any doses of his HIV meds.  Per EMR, patient's last fill for his HIV meds when April 2023. However, patient states he gets these meds delivered to him. He does report that he does not have any inhalers at home.  ED course: Found to be tachycardic to the 120s, mildly tachypneic, SPO2 in the low 90s on room air with soft BP and normal  temperature. Initiated sepsis protocol in the setting of his immunocompromise state. Obtain blood cultures and started patient on IV fluids, Rocephin, azithromycin and 1 dose of Solu-Medrol.  Chest x-ray and CT chest ordered.  IMTS called for admission  Meds:  Current Meds  Medication Sig   albuterol (VENTOLIN HFA) 108 (90 Base) MCG/ACT inhaler Inhale 2 puffs into the lungs every 6 (six) hours as needed for wheezing or shortness of breath.   cetirizine (ZYRTEC) 5 MG tablet Take 1 tablet (5 mg total) by mouth daily.   Darunavir-Cobicistat-Emtricitabine-Tenofovir Alafenamide (SYMTUZA) 800-150-200-10 MG TABS TAKE 1 TABLET BY MOUTH 1 TIME A DAY WITH BREAKFAST   dolutegravir (TIVICAY) 50 MG tablet TAKE ONE TABLET BY MOUTH ONCE DAILY. STORE AT ROOM TEMPERATURE   predniSONE (DELTASONE) 10 MG tablet Take 2 tablets (20 mg total) by mouth daily for 5 days.   sulfamethoxazole-trimethoprim (BACTRIM DS) 800-160 MG tablet Take 1 tablet by mouth daily. (Patient taking differently: Take 1 tablet by mouth daily. Continuously)   Vitamin D, Ergocalciferol, (DRISDOL) 1.25 MG (50000 UNIT) CAPS capsule TAKE 1 CAPSULE BY MOUTH EVERY 7 DAYS     Allergies: Allergies as of 11/15/2021 - Review Complete 11/10/2021  Allergen Reaction Noted   Metformin and related Swelling and Rash 02/27/2021   Past Medical History:  Diagnosis Date   Acute pulmonary embolism ([name]) 11/01/2020   Acute pulmonary embolus ([name]) 02/24/2016   Dx December 2017  taking Eliquis 5mg  BID   Bronchiectasis with acute exacerbation ([name])    Depression    "stress and depression for any man is common" (03/21/2014)   Diabetes mellitus without complication ([name])    DVT (deep venous thrombosis) ([name]) 11/03/2020   Dyspnea    Genital warts 01/04/2017   Hepatitis    "I don't know what hepatitis I have"   HIV disease ([name])    MSSA bacteremia    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 Works at [employer] as a [employer].  Denies any EtOH, tobacco use, or recreational drug use. Family lives in [place].  PCP is Dr. [place].  Review of Systems: A complete ROS was negative except as per HPI.   Physical Exam: Blood pressure 108/67, pulse (!) 119, temperature 97.8 F (36.6 C), temperature source Oral, resp. rate 20, SpO2 94 %. General: Chronically ill appearing, lethargic middle-age man laying in bed covered with multiple blankets. Occasionally falls asleep during interview. No acute distress. HEENT: [place]/AT. Mild conjunctival erythema. Peeling dry skin on forehead. Poor dentition. Dry mucous membrane with dry peeling lips. CV: Tachycardic. Regular rhythm. No murmurs, rubs, or gallops. No LE edema Pulmonary: Lungs CTAB. Normal effort. No wheezing. Distant bibasilar rales at the bases. Decreased air movement at the bases. Abdominal: Soft, NT/ND.  Normal BS.  LTE, pink stoma green-brownish stool in the ostomy bag Extremities: Radial and DP pulses 2+ and symmetric. Skin: Very dry skin all over body. Decrease skin tugor.  Neuro: A&Ox3. Moves all extremities. Normal sensation. No focal deficit. Psych: Normal mood and affect  EKG: personally reviewed my interpretation is sinus tachycardia with HR in the 120s  CXR: personally reviewed my interpretation is chronic emphysema with some lung scarring and increased opacity in the right lung compared to the previous CXR in June.   Assessment & Plan by Problem: Active Problems:   Sepsis [facility])  Mr. 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  #Sepsis 2/2 respiratory infection #Chronic multifocal bronchiectasis #Advance emphysema Immunocompromise patient with a history of advanced emphysema and chronic bronchiectasis presents for 1 day history of nonproductive cough, generalized malaise and headache. Patient has had multiple hospitalizations this year for exacerbation of his COPD as well as community-acquired pneumonia. Patient's presentation concerning for sepsis in the setting of respiratory infection.  WBC normal and patient afebrile however this is likely due to his immunocompromise state. CBC shows rise in WBC from 5.2->8.6 w/ >20% bands. CT chest showed new opacities in his RLL concerning for infection versus inflammation.  Patient's mental status stable and satting 90-93% on room air. Due to patient's immunocompromise state, recent hospitalization and antibiotics use in the past 3 months, will cover for Pseudomonas. MRSA screening during last hospitalization was negative.  COVID-negative but will check a viral panel.  Normal lactic acid.  S/p 1 dose of Rocephin  and azithromycin -Start cefepime 2 g q8h -Continue IV azithromycin 500 mg daily x2 more doses -IVLR at 100cc/hr -F/u bcx, UA, RVP -Check Legionella Ag, urinary strep pneumo -Continue Anoro Ellipta, nebs  #HIV #Hx PJP pneumonia #Hx of pulmonary TB, treated Follows with [facility]. Last visit in June 2023. Last CD4 count at 104 and HIV viral load of 40 in June 2023.  HIV medications include Symtuza and Tivicay with prophylaxis daily Bactrim. Per EMR, patient has not filled his HIV medication since April 2023 however patient states he gets his medications in the mail and has been taking them daily. Last quanterferon gold was negative 5 years ago. -Resume Symtuza and  Tivicay -Resume bactrim for PCP prophylaxis -Check HIV viral load, CD4 count  #AKI on CKD 3B Found to have creatinine of 1.61. Baseline creatinine around 1.1-1.4. Mild AKI likely secondary to dehydration in the setting of recent vomiting episodes and acute infection. S/p 2 L IVLR.  Found to have dry mucous membrane on exam. -Continue IVLR -Daily BMP -Avoid nephrotoxins agents  #Biventricular HF Last TTE in August 2022 showed mildly reduced LVEF of 45-50 with global hypokinesis. RV shows moderately reduced function with global hypokinesis. Patient slightly dry on exam. There is distant crackles at the bases but this is likely secondary to his chronic bronchiectasis.  Will watch his respiratory status closely with his fluid resuscitation. -Strict I&Os -Telemetry  #Hx of multiple prior DVT/PEs Has a history of two prior PE, one in 2017 in the right lung and one in 2022 in the left lung.  Had a negative CTA in June and denies any leg pain today. -Continue DVT prophylaxis with Lovenox  #Pruritic dermatitis Patient has had multiple clinic and ER evaluations for itching on arms, face and back to to be secondary to atopic dermatitis. He has tried steroid creams with some improvement.  No reported itching on evaluation today. -Start triamcinolone cream 0.1% BID PRN for itching -PRN hydroxyzine 10 mg TID for itching  #Diet controlled T2DM A1c is 5.9% 3 months ago. Blood sugar 148 on CMP. Status post 1 dose of IV Solu-Medrol in the ER.  Will start patient on SSI. -SSI w/ meals and CBG monitoring  #Vitamin D deficiency Patient found to have vitamin D levels of 12.2 during hospitalization in June. Started on high-dose vitamin D, 50,000 units weekly. Calcium 8.7 but corrected calcium is normal. 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 Changes the ostomy bag every 2 days.  -Continue routine ostomy care    CODE STATUS: Full  code DIET: Regular PPx: Lovenox  Dispo: Admit patient to Inpatient with expected length of stay greater than 2 midnights.  Signed: Steffanie Rainwater, MD 11/15/2021, 5:24 PM  Pager: [phone] Internal Medicine Teaching Service After 5pm on weekdays and 1pm on weekends: On Call pager: [phone]

## 2021-11-15 NOTE — ED Provider Notes (Signed)
  Physical Exam  BP 100/65   Pulse (!) 121   Resp 20   SpO2 91%   Physical Exam  Procedures  Procedures  ED Course / MDM   Clinical Course as of 11/16/21 1421  Sat Nov 15, 2021  1655 51 yo M with COPD and HIV who presents with flu like symptoms. Shortness of breath and cough. CXR appears to show chronic changes with possible PNA. Also has trop elevation and AKI. Will admit to medicine.  [RP]  1722 On repeat evaluation.  Patient resting comfortably and is in no acute distress.  Still mildly tachycardic.  Discussed with the hospitalist will evaluate the patient for admission. [RP]  1800 Patient admitted to medicine for further management. [RP]    Clinical Course User Index [RP] Rondel Baton, MD   Medical Decision Making Amount and/or Complexity of Data Reviewed Labs: ordered. Radiology: ordered.  Risk OTC drugs. Prescription drug management. Decision regarding hospitalization.     Rondel Baton, MD 11/16/21 [phone]

## 2021-11-15 NOTE — ED Triage Notes (Signed)
Pt bib [facility] c/o non-productive cough x2days, decreased appetite, and headache. Pt received 5mg  albuterol neb. And 500cc NS.   EMS vitals 130 HR 108/72 BP 24 RR 124 CBG

## 2021-11-15 NOTE — ED Notes (Signed)
Admitting at bedside 

## 2021-11-15 NOTE — Hospital Course (Addendum)
Patient assessed at bedside in the ED with swahili interpretor. Fell while walking, felt pain over entire body HA, feeling very cold yesterday\ Throat feeling very dry No fevers Coughing that worsened HA, SOB associated Non-productive cough No inhalers at home Felt dizzy when he was walking No chest pain, just pain over the back that is new when he turns, no pain while just resting No muscle aches Day before yesterday vomitx3, none today, no stomach pain, No pain in legs No diarrhea or constipation No new rashes Says he is taking all meds as prescribed, no missed doses of HIV meds  Recently took prednisone. Last filled HIV meds 06/2021.per EMR, he says they are delivered to the house.  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 PCP with [facility]. Family lives in [place].  He is falling asleep while being interviewed, keeps eyes closed  Gen: Chronically ill appearing, lethargic, laying in bed, well groomed HEENT: conjunctival erythema, peeling dry skin about the forehead, no eyebrow hair, poor dentition, dry peeling lips CV: RRR, no m/r/g, normal s1/s2 Pulm:Normal WOB, Abd: no ptp, green-brown stool in ostomy bag Extremities: warm, dry, no LE edema  Full Code**

## 2021-11-15 NOTE — ED Provider Notes (Signed)
[facility] EMERGENCY DEPARTMENT Provider Note   CSN: [id number] Arrival date & time: 11/15/21  1359     History  Chief Complaint  Patient presents with   Cough    [name] P Ferg is a 51 y.o. male.  The history is provided by the patient. The history is limited by a language barrier. A language interpreter was used.  Cough Associated symptoms: myalgias    Patient presents for flulike symptoms.  Onset was yesterday.  Medical history includes T2DM, HIV, PCP pneumonia, anemia, CHF, CKD, COPD, tuberculosis.  Patient describes a dry cough, headache, nausea, decreased appetite, fatigue, generalized weakness, and lightheadedness with ambulation.  He has not taken any over-the-counter medications today for symptomatic relief.  He denies subjective fevers.  Patient currently lives alone and has not had any known sick contacts.    Home Medications Prior to Admission medications   Medication Sig Start Date End Date Taking? Authorizing Provider  albuterol (VENTOLIN HFA) 108 (90 Base) MCG/ACT inhaler Inhale 2 puffs into the lungs every 6 (six) hours as needed for wheezing or shortness of breath. 03/27/21   Prosperi, Christian H, PA-C  aspirin EC 81 MG tablet Take 1 tablet (81 mg total) by mouth daily. Swallow whole. 09/30/21 09/30/22  Doran Stabler, DO  cetirizine (ZYRTEC) 5 MG tablet Take 1 tablet (5 mg total) by mouth daily. 09/30/21 10/30/21  Doran Stabler, DO  Darunavir-Cobicistat-Emtricitabine-Tenofovir Alafenamide (SYMTUZA) 800-150-200-10 MG TABS TAKE 1 TABLET BY MOUTH 1 TIME A DAY WITH BREAKFAST 09/30/21   Veryl Speak, FNP  dolutegravir (TIVICAY) 50 MG tablet TAKE ONE TABLET BY MOUTH ONCE DAILY. STORE AT ROOM TEMPERATURE 09/30/21   Veryl Speak, FNP  guaiFENesin (ROBITUSSIN) 100 MG/5ML liquid Take 5 mLs by mouth every 4 (four) hours. Patient taking differently: Take 5 mLs by mouth every 4 (four) hours as needed for cough or to loosen phlegm. 08/04/21   Judyann Munson, MD  hydrocortisone 2.5 % lotion Apply topically 2 (two) times daily. Apply to affected area 2 times daily 09/30/21   Doran Stabler, DO  ibuprofen (ADVIL) 200 MG tablet Take 400 mg by mouth every 6 (six) hours as needed for mild pain, fever or headache.    [provider]  OXYGEN Inhale 2 L into the lungs continuous.    [provider]  predniSONE (DELTASONE) 10 MG tablet Take 2 tablets (20 mg total) by mouth daily for 5 days. 11/11/21 11/16/21  Carroll Sage, PA-C  rosuvastatin (CRESTOR) 20 MG tablet Take 1 tablet (20 mg total) by mouth daily. 10/01/21 10/01/22  Doran Stabler, DO  sulfamethoxazole-trimethoprim (BACTRIM DS) 800-160 MG tablet Take 1 tablet by mouth daily. Patient taking differently: Take 1 tablet by mouth daily. Continuously 08/25/21 11/23/21  Dellis Filbert, MD  triamcinolone oint 0.025%-Cerave equivalent cream 1:1 mixture Apply topically 2 (two) times daily. 09/30/21   Doran Stabler, DO  umeclidinium-vilanterol [facility] ELLIPTA) 62.5-25 MCG/ACT AEPB Inhale 1 puff into the lungs daily. 10/01/21   Doran Stabler, DO  Vitamin D, Ergocalciferol, (DRISDOL) 1.25 MG (50000 UNIT) CAPS capsule TAKE 1 CAPSULE BY MOUTH EVERY 7 DAYS 10/24/21   Marolyn Haller, MD      Allergies    Metformin and related    Review of Systems   Review of Systems  Constitutional:  Positive for activity change, appetite change and fatigue.  Respiratory:  Positive for cough.   Gastrointestinal:  Positive for nausea and vomiting.  Musculoskeletal:  Positive for arthralgias and myalgias.  Neurological:  Positive for weakness (Generalized) and light-headedness.  All other systems reviewed and are negative.   Physical Exam Updated Vital Signs BP 108/67   Pulse (!) 119   Temp 97.8 F (36.6 C) (Oral)   Resp 20   SpO2 94%  Physical Exam Vitals and nursing note reviewed.  Constitutional:      General: He is not in acute distress.    Appearance: He is well-developed and normal weight.  He is ill-appearing. He is not diaphoretic.  HENT:     Head: Normocephalic and atraumatic.     Right Ear: External ear normal.     Left Ear: External ear normal.     Nose: Nose normal.     Mouth/Throat:     Mouth: Mucous membranes are moist.     Pharynx: Oropharynx is clear.  Eyes:     Conjunctiva/sclera: Conjunctivae normal.  Cardiovascular:     Rate and Rhythm: Regular rhythm. Tachycardia present.     Heart sounds: No murmur heard. Pulmonary:     Effort: Pulmonary effort is normal. No respiratory distress.     Breath sounds: Wheezing and rhonchi present. No rales.  Abdominal:     General: There is no distension.     Palpations: Abdomen is soft.     Tenderness: There is no abdominal tenderness.     Comments: Ostomy bag left lower quadrant consisting of watery brown contents  Musculoskeletal:        General: No swelling. Normal range of motion.     Cervical back: Normal range of motion and neck supple. No rigidity.     Right lower leg: No edema.     Left lower leg: No edema.  Skin:    General: Skin is warm and dry.     Capillary Refill: Capillary refill takes less than 2 seconds.     Coloration: Skin is not jaundiced or pale.  Neurological:     General: No focal deficit present.     Mental Status: He is alert and oriented to person, place, and time.     Cranial Nerves: No cranial nerve deficit.     Sensory: No sensory deficit.     Motor: No weakness.     Coordination: Coordination normal.  Psychiatric:        Mood and Affect: Mood normal.        Behavior: Behavior normal.        Thought Content: Thought content normal.        Judgment: Judgment normal.     ED Results / Procedures / Treatments   Labs (all labs ordered are listed, but only abnormal results are displayed) Labs Reviewed  COMPREHENSIVE METABOLIC PANEL - Abnormal; Notable for the following components:      Result Value   Potassium 3.2 (*)    Glucose, Bld 148 (*)    BUN 24 (*)    Creatinine, Ser 1.61  (*)    Calcium 8.7 (*)    Albumin 2.5 (*)    AST 93 (*)    ALT 47 (*)    GFR, Estimated 52 (*)    Anion gap 18 (*)    All other components within normal limits  CBC WITH DIFFERENTIAL/PLATELET - Abnormal; Notable for the following components:   RBC 4.08 (*)    MCV 100.7 (*)    Abs Immature Granulocytes 0.08 (*)    All other components within normal limits  TROPONIN I (HIGH SENSITIVITY) - Abnormal; Notable for the following components:  Troponin I (High Sensitivity) 25 (*)    All other components within normal limits  CULTURE, BLOOD (ROUTINE X 2)  CULTURE, BLOOD (ROUTINE X 2)  RESP PANEL BY RT-PCR (FLU A&B, COVID) ARPGX2  LACTIC ACID, PLASMA  PROTIME-INR  MAGNESIUM  LACTIC ACID, PLASMA  URINALYSIS, ROUTINE W REFLEX MICROSCOPIC  TROPONIN I (HIGH SENSITIVITY)    EKG EKG Interpretation  Date/Time:  Saturday November 15 2021 14:04:40 EDT Ventricular Rate:  124 PR Interval:  137 QRS Duration: 94 QT Interval:  327 QTC Calculation: 470 R Axis:   249 Text Interpretation: Sinus tachycardia Confirmed by Godfrey Pick ([id number]) on 11/15/2021 2:47:20 PM  Radiology DG Chest 2 View  Result Date: 11/15/2021 CLINICAL DATA:  Suspected Sepsis EXAM: CHEST - 2 VIEW COMPARISON:  Most recent radiograph and CT 08/26/2021 FINDINGS: Emphysema and left greater than right lung scarring, similar to prior exam. No radiographic evidence of acute airspace disease. The heart is stable in size, unchanged mediastinal contours. Right hilar prominence corresponds to the pulmonary artery on prior CT. There may be a trace left pleural effusion. No pulmonary edema. No pneumothorax. IMPRESSION: 1. Possible trace left pleural effusion. 2. Emphysema and left greater than right lung scarring, similar to prior exam. No radiographic evidence of acute airspace disease. Electronically Signed   By: Keith Rake M.D.   On: 11/15/2021 15:17    Procedures Procedures    Medications Ordered in ED Medications  lactated  ringers bolus 1,000 mL (has no administration in time range)  cefTRIAXone (ROCEPHIN) 1 g in sodium chloride 0.9 % 100 mL IVPB (has no administration in time range)  azithromycin (ZITHROMAX) 500 mg in sodium chloride 0.9 % 250 mL IVPB (has no administration in time range)  methylPREDNISolone sodium succinate (SOLU-MEDROL) 125 mg/2 mL injection 125 mg (has no administration in time range)  acetaminophen (TYLENOL) tablet 650 mg (has no administration in time range)  lactated ringers bolus 1,000 mL (0 mLs Intravenous Stopped 11/15/21 1738)  metoCLOPramide (REGLAN) injection 10 mg (10 mg Intravenous Given 11/15/21 1544)  ipratropium-albuterol ([name]) 0.5-2.5 (3) MG/3ML nebulizer solution 3 mL (3 mLs Nebulization Given 11/15/21 1545)    ED Course/ Medical Decision Making/ A&P Clinical Course as of 11/15/21 1747  Sat Nov 15, 2021  1655 51 yo M with COPD and HIV who presents with flu like symptoms. Shortness of breath and cough. CXR appears to show chronic changes with possible PNA. Also has trop elevation and AKI. Will admit to medicine.  [RP]  1722 Discussed with the hospitalist will evaluate the patient for admission. [RP]    Clinical Course User Index [RP] Fransico Meadow, MD                           Medical Decision Making Amount and/or Complexity of Data Reviewed Labs: ordered. Radiology: ordered.  Risk Prescription drug management.   This patient presents to the ED for concern of flulike symptoms, this involves an extensive number of treatment options, and is a complaint that carries with it a high risk of complications and morbidity.  The differential diagnosis includes URI, pneumonia, bacteremia, dehydration, COPD exacerbation   Co morbidities that complicate the patient evaluation  T2DM, HIV, PCP pneumonia, anemia, CHF, CKD, COPD, tuberculosis   Additional history obtained:  Additional history obtained from N/A External records from outside source obtained and reviewed  including EMR   Lab Tests:  I Ordered, and personally interpreted labs.  The pertinent results include: AKI  is present.  Patient has hypokalemia with otherwise normal electrolytes.  Hemoglobin is normal and there is no leukocytosis.  Troponin is mildly elevated.   Imaging Studies ordered:  I ordered imaging studies including chest x-ray I independently visualized and interpreted imaging which showed emphysema and lung scarring, primarily on the left, possible trace left pleural effusion I agree with the radiologist interpretation   Cardiac Monitoring: / EKG:  The patient was maintained on a cardiac monitor.  I personally viewed and interpreted the cardiac monitored which showed an underlying rhythm of: Sinus rhythm  Problem List / ED Course / Critical interventions / Medication management  Patient is a 51 year old male with extensive medical history, presenting for flulike symptoms since yesterday.  Vital signs on arrival are notable for tachycardia and low-normal blood pressure.  SPO2 is 91% on room air.  He is not on supplemental oxygen at baseline.  On exam, he is ill-appearing.  Despite this, he is alert and oriented and able to provide history.  History is provided through [employer] interpreter.  Patient currently endorses headache, generalized body aches, fatigue, and nausea.  He has had a recent dry cough.  On lung auscultation, he has diffuse rhonchi and wheezing.  He does have a history of COPD.  Patient was given treatment for COPD exacerbation with Solu-Medrol and [name].  Given his p.o. intolerance, IV fluids were given.  Reglan was given for nausea.  On x-ray, he has what appears to be lung scarring.  Per chart review, he does have a history of tuberculosis and PCP pneumonia.  CT of chest was ordered to further characterize any possible lung pathology leading to his symptoms today.  Given concern of possible pneumonia as well as high suspicion of COPD exacerbation, patient was given  ceftriaxone and azithromycin.  Lab work is notable for AKI.  Additional IV fluids were ordered.  Plan is for admission.  Care of patient was signed out to oncoming ED provider. I ordered medication including IV fluids for hydration and AKI; Solu-Medrol, [name], and antibiotics for COPD exacerbation; Reglan for nausea; Tylenol for generalized aches. Reevaluation of the patient after these medicines showed that the patient improved I have reviewed the patients home medicines and have made adjustments as needed   Social Determinants of Health:  Has access to outpatient care          Final Clinical Impression(s) / ED Diagnoses Final diagnoses:  Acute cough  COPD exacerbation ([name])  Flu-like symptoms  AKI (acute kidney injury) [facility])    Rx / DC Orders ED Discharge Orders     None         Gloris Manchester, MD 11/15/21 [phone]

## 2021-11-16 ENCOUNTER — Inpatient Hospital Stay (HOSPITAL_COMMUNITY): Payer: BC Managed Care – PPO

## 2021-11-16 ENCOUNTER — Encounter (HOSPITAL_COMMUNITY): Payer: Self-pay | Admitting: Internal Medicine

## 2021-11-16 DIAGNOSIS — A329 Listeriosis, unspecified: Secondary | ICD-10-CM | POA: Diagnosis not present

## 2021-11-16 DIAGNOSIS — B20 Human immunodeficiency virus [HIV] disease: Secondary | ICD-10-CM

## 2021-11-16 LAB — BLOOD CULTURE ID PANEL (REFLEXED) - BCID2

## 2021-11-16 LAB — CBC
HCT: 37.7 % — ABNORMAL LOW (ref 39.0–52.0)
Hemoglobin: 12.4 g/dL — ABNORMAL LOW (ref 13.0–17.0)
MCH: 32.4 pg (ref 26.0–34.0)
MCHC: 32.9 g/dL (ref 30.0–36.0)
MCV: 98.4 fL (ref 80.0–100.0)
Platelets: 144 10*3/uL — ABNORMAL LOW (ref 150–400)
RBC: 3.83 MIL/uL — ABNORMAL LOW (ref 4.22–5.81)
RDW: 12.7 % (ref 11.5–15.5)
WBC: 3.7 10*3/uL — ABNORMAL LOW (ref 4.0–10.5)
nRBC: 0 % (ref 0.0–0.2)

## 2021-11-16 LAB — STREP PNEUMONIAE URINARY ANTIGEN: Strep Pneumo Urinary Antigen: NEGATIVE

## 2021-11-16 LAB — URINALYSIS, ROUTINE W REFLEX MICROSCOPIC
Bacteria, UA: NONE SEEN
Bilirubin Urine: NEGATIVE
Glucose, UA: 50 mg/dL — AB
Ketones, ur: 5 mg/dL — AB
Leukocytes,Ua: NEGATIVE
Nitrite: NEGATIVE
Protein, ur: 100 mg/dL — AB
Specific Gravity, Urine: 1.019 (ref 1.005–1.030)
pH: 6 (ref 5.0–8.0)

## 2021-11-16 LAB — RESPIRATORY PANEL BY PCR

## 2021-11-16 LAB — GLUCOSE, CAPILLARY
Glucose-Capillary: 280 mg/dL — ABNORMAL HIGH (ref 70–99)
Glucose-Capillary: 306 mg/dL — ABNORMAL HIGH (ref 70–99)
Glucose-Capillary: 275 mg/dL — ABNORMAL HIGH (ref 70–99)
Glucose-Capillary: 252 mg/dL — ABNORMAL HIGH (ref 70–99)

## 2021-11-16 LAB — BASIC METABOLIC PANEL
Anion gap: 11 (ref 5–15)
BUN: 22 mg/dL — ABNORMAL HIGH (ref 6–20)
CO2: 22 mmol/L (ref 22–32)
Calcium: 8.2 mg/dL — ABNORMAL LOW (ref 8.9–10.3)
Chloride: 103 mmol/L (ref 98–111)
Creatinine, Ser: 1.15 mg/dL (ref 0.61–1.24)
GFR, Estimated: >60 mL/min (ref >60)
Glucose, Bld: 221 mg/dL — ABNORMAL HIGH (ref 70–99)
Potassium: 3.3 mmol/L — ABNORMAL LOW (ref 3.5–5.1)
Sodium: 136 mmol/L (ref 135–145)

## 2021-11-16 LAB — CK: Total CK: 83 U/L (ref 49–397)

## 2021-11-16 MED ORDER — SODIUM CHLORIDE 0.9 % IV SOLN
2.0000 g | INTRAVENOUS | Status: AC
  Administered 2021-11-16: 2 g via INTRAVENOUS
  Filled 2021-11-16: qty 2000

## 2021-11-16 MED ORDER — VITAMIN D 25 MCG (1000 UNIT) PO TABS
1000.0000 [IU] | ORAL_TABLET | Freq: Every day | ORAL | Status: DC
  Administered 2021-11-16 – 2021-11-19 (×4): 1000 [IU] via ORAL
  Filled 2021-11-16 (×4): qty 1

## 2021-11-16 MED ORDER — AZITHROMYCIN 500 MG PO TABS
500.0000 mg | ORAL_TABLET | Freq: Every day | ORAL | Status: DC
Start: 2021-11-16 — End: 2021-11-16

## 2021-11-16 MED ORDER — INSULIN ASPART 100 UNIT/ML IJ SOLN
0.0000 [IU] | Freq: Three times a day (TID) | INTRAMUSCULAR | Status: DC
  Administered 2021-11-16: 8 [IU] via SUBCUTANEOUS
  Administered 2021-11-17 (×3): 3 [IU] via SUBCUTANEOUS
  Administered 2021-11-18 – 2021-11-19 (×3): 2 [IU] via SUBCUTANEOUS

## 2021-11-16 MED ORDER — POTASSIUM CHLORIDE CRYS ER 20 MEQ PO TBCR
40.0000 meq | EXTENDED_RELEASE_TABLET | Freq: Once | ORAL | Status: AC
  Administered 2021-11-16: 40 meq via ORAL
  Filled 2021-11-16: qty 2

## 2021-11-16 MED ORDER — SODIUM CHLORIDE 0.9 % IV SOLN
2.0000 g | INTRAVENOUS | Status: DC
  Administered 2021-11-16 – 2021-11-18 (×10): 2 g via INTRAVENOUS
  Filled 2021-11-16 (×11): qty 2000

## 2021-11-16 NOTE — Progress Notes (Signed)
PHARMACY - PHYSICIAN COMMUNICATION CRITICAL VALUE ALERT - BLOOD CULTURE IDENTIFICATION (BCID)  [name] is an 51 y.o. 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   Name of physician (or Provider) Contacted: IM team   Current antibiotics: Cefepime and Azithromycin   Changes to prescribed antibiotics recommended:   ID involved due to history of HIV/AIDS. Defer to their recommendations.   Results for orders placed or performed during the hospital encounter of 11/15/21  Blood Culture ID Panel (Reflexed) (Collected: 11/15/2021  2:05 PM)  Result Value Ref Range   Enterococcus faecalis NOT DETECTED NOT DETECTED   Enterococcus Faecium NOT DETECTED NOT DETECTED   Listeria monocytogenes DETECTED (A) NOT DETECTED   Staphylococcus species DETECTED (A) NOT DETECTED   Staphylococcus aureus (BCID) NOT DETECTED NOT DETECTED   Staphylococcus epidermidis DETECTED (A) NOT DETECTED   Staphylococcus lugdunensis NOT DETECTED NOT DETECTED   Streptococcus species NOT DETECTED NOT DETECTED   Streptococcus agalactiae NOT DETECTED NOT DETECTED   Streptococcus pneumoniae NOT DETECTED NOT DETECTED   Streptococcus pyogenes NOT DETECTED NOT DETECTED   A.calcoaceticus-baumannii NOT DETECTED NOT DETECTED   Bacteroides fragilis NOT DETECTED NOT DETECTED   Enterobacterales NOT DETECTED NOT DETECTED   Enterobacter cloacae complex NOT DETECTED NOT DETECTED   Escherichia coli NOT DETECTED NOT DETECTED   Klebsiella aerogenes NOT DETECTED NOT DETECTED   Klebsiella oxytoca NOT DETECTED NOT DETECTED   Klebsiella pneumoniae NOT DETECTED NOT DETECTED   Proteus species NOT DETECTED NOT DETECTED   Salmonella species NOT DETECTED NOT DETECTED   Serratia marcescens NOT DETECTED NOT DETECTED   Haemophilus influenzae NOT DETECTED NOT DETECTED   Neisseria meningitidis  NOT DETECTED NOT DETECTED   Pseudomonas aeruginosa NOT DETECTED NOT DETECTED   Stenotrophomonas maltophilia NOT DETECTED NOT DETECTED   Candida albicans NOT DETECTED NOT DETECTED   Candida auris NOT DETECTED NOT DETECTED   Candida glabrata NOT DETECTED NOT DETECTED   Candida krusei NOT DETECTED NOT DETECTED   Candida parapsilosis NOT DETECTED NOT DETECTED   Candida tropicalis NOT DETECTED NOT DETECTED   Cryptococcus neoformans/gattii NOT DETECTED NOT DETECTED   Methicillin resistance mecA/C DETECTED (A) NOT DETECTED    [name] 11/16/2021  2:17 PM

## 2021-11-16 NOTE — Consult Note (Signed)
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 Bcx so far 1 of 2 set listeria and staph epi (which would be a contaminant). But listeria would need to be treated and I suspect it is a primary driver in his case... AIDS patients are at risk for this   While vanc has potentially some in vitro activity against listeria, it is not a standard of tx. Perhaps his improvement has something to do with this, but dedicated tx would be ampicillin or trim-sulfa.    Given quick defervescence, I have low suspicion for metastatic complication such as endocarditis. While listeria is neurotropic, his headache had resolved rather quickly. Treatment would be the same for cns/bacteremia and I do not feel LP would add any benefit.  However, repeat bcx and at least a tte would be reasonable given high mortality with listeria bsi and higher risk of endocarditis in this patient population    His hiv has been well controlled and he is compliant at least for the last several months based on his 08/2021 hiv check and current self report. Repeat viral load/cd4 is in process   He reports no symptomatology of pna to me although it was mentioned in primary team h&p. His ct had for the most part remained the same outside of axillary adenopathy that is more pronounced. This will need to be followed up. Given his cd4 count, the ddx for this can be wide including hiv, lymphoma, reactive to listeria infection (rare), or other  fungal/mycobacteria oi. He doesn't report a story of chronic b - sx or tb relapse at this time  Plan: Repeat bcx Tte Ok to stop vanc/cefepime Continue outpatient hiv meds, including bactrim prophy Start ampicillin for listeriosis If tte negative and repeat bcx negative, would change ampicillin to treatment dose trim-sulfa to complete 2 week course listeria bsi treatment Would need to keep him at least till Tuesday for complete w/u I do not feel need to send w/u for the lymphadenopathy at this time Oncoming id team will make f/u for him in [facility] in 1 week after discharge Discussed with primary team    I spent 75 minute reviewing data/chart, and coordinating care and >50% direct face to face time providing counseling/discussing diagnostics/treatment plan with patient       ------------------------------------------------ Principal Problem:   Pneumonia Active Problems:   HIV disease ([name])   AIDS (acquired immune deficiency syndrome) ([name])   Pulmonary emphysema ([name])   Bronchiectasis without acute exacerbation ([name])    HPI: [name] is a 51 y.o. male his hiv/aids, pjp, treated tb, copd/bronchiectasis, admitted 9/2 with sepsis in setting 1-2 days acute onset generalized weakness, myalgia/headache   Hx via phone language interpreter, and also reviewed primary team note and sign/out  He reports he was well until the day prior to admission when he suddenly developed generalized body ache and headache. He also had an episode of vomiting the day of admission. H&P reports dry  cough although he denies that during my interview. No visual change, confusion, chest pain, dyspnea, abd pain, diarrhea, rash, joint pain  No exotic food consumption  No sick contact  No recent travel  He has been compliant with his art/bactrim prophy.  His 08/2021 hiv rna was undetectable. His cd4 percentage has been climbing gradually since late 2022 from 3% to 16% as of 08/2021  Hospital  course: Afebrile No leukocytosis Chest ct as below Empiric abx vanc/cefepime started Bcx 1 of 2 set staph epi and listeria   Today he said he is feeling back to normal baseline health. Appetite is great. No complaint. Hasn't had n/v/diarrhea/headache/myalgia here today    Family History  Problem Relation Age of Onset   Hypertension Other    Heart disease Sister     Social History   Tobacco Use   Smoking status: Former    Packs/day: 0.50    Years: 6.00    Total pack years: 3.00    Types: Cigarettes    Quit date: 03/16/2001    Years since quitting: 20.6   Smokeless tobacco: Never   Tobacco comments:    "quit smoking ~ 2003"  Vaping Use   Vaping Use: Never used  Substance Use Topics   Alcohol use: Not Currently    Comment: drinks bottled beer intermittently   Drug use: No    Allergies  Allergen Reactions   Metformin And Related Swelling and Rash    Pt doesn't remember this allergy, but does state he gets rash. Unsure if related to medication.    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)    TB (pulmonary tuberculosis)    previously treated according to refugee documentation       Scheduled Meds:  aspirin EC  81 mg Oral Daily   azithromycin  500 mg Oral q1800   cholecalciferol  1,000 Units  Oral Daily   Darunavir-Cobicistat-Emtricitabine-Tenofovir Alafenamide  1 tablet Oral Q breakfast   dolutegravir  50 mg Oral Daily   enoxaparin (LOVENOX) injection  40 mg Subcutaneous Q24H   insulin aspart  0-9 Units Subcutaneous TID WC   rosuvastatin  20 mg Oral Daily   sulfamethoxazole-trimethoprim  1 tablet Oral Daily   umeclidinium-vilanterol  1 puff Inhalation Daily   Continuous Infusions:  ampicillin (OMNIPEN) IV     ceFEPime (MAXIPIME) IV 2 g (11/16/21 0527)   lactated ringers 100 mL/hr at 11/16/21 0622   PRN Meds:.acetaminophen **OR** acetaminophen, guaiFENesin, hydrOXYzine, ipratropium-albuterol, ondansetron **OR** ondansetron (ZOFRAN) IV, senna-docusate, triamcinolone cream   OBJECTIVE: Blood pressure 125/87, pulse 87, temperature 98.4 F (36.9 C), resp. rate 18, SpO2 95 %.  Physical Exam General/constitutional: no distress, pleasant HEENT: Normocephalic, PER, Conj Clear, EOMI, Oropharynx clear Neck supple CV: rrr no  mrg Lungs: clear to auscultation, normal respiratory effort Abd: Soft, Nontender Ext: no edema Skin: minor scabbing left lateral distal LE Neuro: nonfocal MSK: no peripheral joint swelling/tenderness/warmth; back spines nontender    Lab Results Lab Results  Component Value Date   WBC 3.7 (L) 11/16/2021   HGB 12.4 (L) 11/16/2021   HCT 37.7 (L) 11/16/2021   MCV 98.4 11/16/2021   PLT 144 (L) 11/16/2021    Lab Results  Component Value Date   CREATININE 1.15 11/16/2021   BUN 22 (H) 11/16/2021   NA 136 11/16/2021   K 3.3 (L) 11/16/2021   CL 103 11/16/2021   CO2 22 11/16/2021    Lab Results  Component Value Date   ALT 47 (H) 11/15/2021   AST 93 (H) 11/15/2021   ALKPHOS 46 11/15/2021   BILITOT 1.2 11/15/2021      Microbiology: Recent Results (from the past 240 hour(s))  Culture, blood (Routine x 2)     Status: None (Preliminary result)   Collection Time: 11/15/21  2:05 PM   Specimen: BLOOD RIGHT HAND  Result Value Ref Range Status    Specimen Description BLOOD RIGHT HAND  Final   Special Requests   Final    BOTTLES DRAWN AEROBIC AND ANAEROBIC Blood Culture adequate volume   Culture  Setup Time   Final    GRAM POSITIVE COCCI IN CLUSTERS IN BOTH AEROBIC AND ANAEROBIC BOTTLES GRAM POSITIVE RODS CRITICAL RESULT CALLED TO, READ BACK BY AND VERIFIED WITHRosalva Ferron PHARMD, AT 1401 11/16/21 D. VANHOOK Performed at [facility], [place] [address], [place], [place] [address]    Culture GRAM POSITIVE COCCI GRAM POSITIVE RODS   Final   Report Status PENDING  Incomplete  Blood Culture ID Panel (Reflexed)     Status: Abnormal   Collection Time: 11/15/21  2:05 PM  Result Value Ref Range Status   Enterococcus faecalis NOT DETECTED NOT DETECTED Final   Enterococcus Faecium NOT DETECTED NOT DETECTED Final   Listeria monocytogenes DETECTED (A) NOT DETECTED Final    Comment: CRITICAL RESULT CALLED TO, READ BACK BY AND VERIFIED WITHRosalva Ferron PHARMD, AT 1401 11/16/21 D. VANHOOK    Staphylococcus species DETECTED (A) NOT DETECTED Final    Comment: CRITICAL RESULT CALLED TO, READ BACK BY AND VERIFIED WITHRosalva Ferron PHARMD, AT 1401 11/16/21 D. VANHOOK    Staphylococcus aureus (BCID) NOT DETECTED NOT DETECTED Final   Staphylococcus epidermidis DETECTED (A) NOT DETECTED Final    Comment: Methicillin (oxacillin) resistant coagulase negative staphylococcus. Possible blood culture contaminant (unless isolated from more than one blood culture draw or clinical case suggests pathogenicity). No antibiotic treatment is indicated for blood  culture contaminants. CRITICAL RESULT CALLED TO, READ BACK BY AND VERIFIED WITHRosalva Ferron PHARMD, AT 1401 11/16/21 D. VANHOOK    Staphylococcus lugdunensis NOT DETECTED NOT DETECTED Final   Streptococcus species NOT DETECTED NOT DETECTED Final   Streptococcus agalactiae NOT DETECTED NOT DETECTED Final   Streptococcus pneumoniae NOT DETECTED NOT DETECTED Final   Streptococcus pyogenes NOT DETECTED NOT  DETECTED Final   A.calcoaceticus-baumannii NOT DETECTED NOT DETECTED Final   Bacteroides fragilis NOT DETECTED NOT DETECTED Final   Enterobacterales NOT DETECTED NOT DETECTED Final   Enterobacter cloacae complex NOT DETECTED NOT DETECTED Final   Escherichia coli NOT DETECTED NOT DETECTED Final   Klebsiella aerogenes NOT DETECTED NOT DETECTED Final   Klebsiella oxytoca NOT DETECTED NOT DETECTED Final   Klebsiella pneumoniae NOT DETECTED NOT DETECTED Final   Proteus  species NOT DETECTED NOT DETECTED Final   Salmonella species NOT DETECTED NOT DETECTED Final   Serratia marcescens NOT DETECTED NOT DETECTED Final   Haemophilus influenzae NOT DETECTED NOT DETECTED Final   Neisseria meningitidis NOT DETECTED NOT DETECTED Final   Pseudomonas aeruginosa NOT DETECTED NOT DETECTED Final   Stenotrophomonas maltophilia NOT DETECTED NOT DETECTED Final   Candida albicans NOT DETECTED NOT DETECTED Final   Candida auris NOT DETECTED NOT DETECTED Final   Candida glabrata NOT DETECTED NOT DETECTED Final   Candida krusei NOT DETECTED NOT DETECTED Final   Candida parapsilosis NOT DETECTED NOT DETECTED Final   Candida tropicalis NOT DETECTED NOT DETECTED Final   Cryptococcus neoformans/gattii NOT DETECTED NOT DETECTED Final   Methicillin resistance mecA/C DETECTED (A) NOT DETECTED Final    Comment: CRITICAL RESULT CALLED TO, READ BACK BY AND VERIFIED WITHRosalva Ferron PHARMD, AT 1401 11/16/21 D. VANHOOK Performed at [facility], [place] [address] [place], [place] [address]   Culture, blood (Routine x 2)     Status: None (Preliminary result)   Collection Time: 11/15/21  2:10 PM   Specimen: BLOOD  Result Value Ref Range Status   Specimen Description BLOOD RIGHT ANTECUBITAL  Final   Special Requests   Final    BOTTLES DRAWN AEROBIC AND ANAEROBIC Blood Culture results may not be optimal due to an inadequate volume of blood received in culture bottles   Culture   Final    NO GROWTH < 24 HOURS Performed at  [facility], [place] [address] [place], [place] [address]    Report Status PENDING  Incomplete  Resp Panel by RT-PCR (Flu A&B, Covid) Anterior Nasal Swab     Status: None   Collection Time: 11/15/21  2:47 PM   Specimen: Anterior Nasal Swab  Result Value Ref Range Status   SARS Coronavirus 2 by RT PCR NEGATIVE NEGATIVE Final    Comment: (NOTE) SARS-CoV-2 target nucleic acids are NOT DETECTED.  The SARS-CoV-2 RNA is generally detectable in upper respiratory specimens during the acute phase of infection. The lowest concentration of SARS-CoV-2 viral copies this assay can detect is 138 copies/mL. A negative result does not preclude SARS-Cov-2 infection and should not be used as the sole basis for treatment or other patient management decisions. A negative result may occur with  improper specimen collection/handling, submission of specimen other than nasopharyngeal swab, presence of viral mutation(s) within the areas targeted by this assay, and inadequate number of viral copies(<138 copies/mL). A negative result must be combined with clinical observations, patient history, and epidemiological information. The expected result is Negative.  Fact Sheet for Patients:  EntrepreneurPulse.com.au  Fact Sheet for Healthcare Providers:  IncredibleEmployment.be  This test is no t yet approved or cleared by the [place] FDA and  has been authorized for detection and/or diagnosis of SARS-CoV-2 by FDA under an Emergency Use Authorization (EUA). This EUA will remain  in effect (meaning this test can be used) for the duration of the COVID-19 declaration under Section 564(b)(1) of the Act, 21 U.S.C.section 360bbb-3(b)(1), unless the authorization is terminated  or revoked sooner.       Influenza A by PCR NEGATIVE NEGATIVE Final   Influenza B by PCR NEGATIVE NEGATIVE Final    Comment: (NOTE) The Xpert Xpress SARS-CoV-2/FLU/RSV plus assay is intended as  an aid in the diagnosis of influenza from Nasopharyngeal swab specimens and should not be used as a sole basis for treatment. Nasal washings and aspirates are unacceptable  for Xpert Xpress SARS-CoV-2/FLU/RSV testing.  Fact Sheet for Patients: EntrepreneurPulse.com.au  Fact Sheet for Healthcare Providers: IncredibleEmployment.be  This test is not yet approved or cleared by the [place] FDA and has been authorized for detection and/or diagnosis of SARS-CoV-2 by FDA under an Emergency Use Authorization (EUA). This EUA will remain in effect (meaning this test can be used) for the duration of the COVID-19 declaration under Section 564(b)(1) of the Act, 21 U.S.C. section 360bbb-3(b)(1), unless the authorization is terminated or revoked.  Performed at [facility], [place] [address] [place], [place] [address]   Respiratory (~20 pathogens) panel by PCR     Status: None   Collection Time: 11/15/21  2:47 PM   Specimen: Nasopharyngeal Swab; Respiratory  Result Value Ref Range Status   Adenovirus NOT DETECTED NOT DETECTED Final   Coronavirus 229E NOT DETECTED NOT DETECTED Final    Comment: (NOTE) The Coronavirus on the Respiratory Panel, DOES NOT test for the novel  Coronavirus (2019 nCoV)    Coronavirus HKU1 NOT DETECTED NOT DETECTED Final   Coronavirus NL63 NOT DETECTED NOT DETECTED Final   Coronavirus OC43 NOT DETECTED NOT DETECTED Final   Metapneumovirus NOT DETECTED NOT DETECTED Final   Rhinovirus / Enterovirus NOT DETECTED NOT DETECTED Final   Influenza A NOT DETECTED NOT DETECTED Final   Influenza B NOT DETECTED NOT DETECTED Final   Parainfluenza Virus 1 NOT DETECTED NOT DETECTED Final   Parainfluenza Virus 2 NOT DETECTED NOT DETECTED Final   Parainfluenza Virus 3 NOT DETECTED NOT DETECTED Final   Parainfluenza Virus 4 NOT DETECTED NOT DETECTED Final   Respiratory Syncytial Virus NOT DETECTED NOT DETECTED Final   Bordetella pertussis  NOT DETECTED NOT DETECTED Final   Bordetella Parapertussis NOT DETECTED NOT DETECTED Final   Chlamydophila pneumoniae NOT DETECTED NOT DETECTED Final   Mycoplasma pneumoniae NOT DETECTED NOT DETECTED Final    Comment: Performed at [facility], [place] [address] [place], [place] [address]     Serology:    Imaging: If present, new imagings (plain films, ct scans, and mri) have been personally visualized and interpreted; radiology reports have been reviewed. Decision making incorporated into the Impression / Recommendations.  9/2 chest ct 1. Faint tree-in-bud opacities in the right lower lobe are new from prior exam, likely infectious or inflammatory. 2. Background scarring and emphysema. This includes unchanged nodular area of scarring/architectural distortion in the left upper lobe. 3. Similar left lower lobe bronchiectasis with mucoid impaction and air-fluid levels. Suspect persistent infection. 4. Prominent bilateral axillary nodes are new from prior exam, likely reactive. Small amount of lobulated fluid/soft tissue density adjacent to the left pectoralis musculature in the high axilla is new from prior exam. No definite associated inflammation or soft tissue gas, recommend correlation with physical exam.   Emphysema   Jabier Mutton, [place] for [place] [phone] pager    11/16/2021, 2:45 PM

## 2021-11-16 NOTE — Progress Notes (Addendum)
Subjective:   Hospital day: 1  Overnight event: No acute events overnight  Interim History: Patient evaluated at the bedside with [name] interpreter. According to RN, patient had a episode of emesis in the morning. On evaluation, reports some nausea but denies any abdominal pain,  chest pain, chills, shortness of breath or pain. States his cough has improved.  Objective:  Vital signs in last 24 hours: Vitals:   11/16/21 0213 11/16/21 0509 11/16/21 0819 11/16/21 0945  BP: 120/84 125/87  125/87  Pulse: 81 84  87  Resp: 16 17  18   Temp: 99.3 F (37.4 C) 98.2 F (36.8 C)  98.4 F (36.9 C)  TempSrc: Axillary     SpO2: 93% 94% 94% 95%    There were no vitals filed for this visit.   Intake/Output Summary (Last 24 hours) at 11/16/2021 1439 Last data filed at 11/16/2021 1248 Gross per 24 hour  Intake 600 ml  Output 1225 ml  Net -625 ml   Net IO Since Admission: -625 mL [11/16/21 1439]  Recent Labs    11/15/21 2255 11/16/21 0713 11/16/21 1202  GLUCAP 197* 280* 306*     Pertinent Labs:    Latest Ref Rng & Units 11/16/2021    4:56 AM 11/15/2021    3:33 PM 11/11/2021    3:54 AM  CBC  WBC 4.0 - 10.5 K/uL 3.7  8.6  5.2   Hemoglobin 13.0 - 17.0 g/dL 11/13/2021  59.9  35.7   Hematocrit 39.0 - 52.0 % 37.7  41.1  41.2   Platelets 150 - 400 K/uL 144  154  182        Latest Ref Rng & Units 11/16/2021    4:56 AM 11/15/2021    3:33 PM 11/11/2021    3:54 AM  CMP  Glucose 70 - 99 mg/dL 11/13/2021  793  903   BUN 6 - 20 mg/dL 22  24  10    Creatinine 0.61 - 1.24 mg/dL 009   2.33   Sodium 135 - 145 mmol/L 136  138  139   Potassium 3.5 - 5.1 mmol/L 3.3  3.2  3.6   Chloride 98 - 111 mmol/L 103  98  102   CO2 22 - 32 mmol/L 22  22  29    Calcium 8.9 - 10.3 mg/dL 8.2  8.7  9.0   Total Protein 6.5 - 8.1 g/dL  7.7    Total Bilirubin 0.3 - 1.2 mg/dL  1.2    Alkaline Phos 38 - 126 U/L  46    AST 15 - 41 U/L  93    ALT 0 - 44 U/L  47      Imaging: CT Chest Wo Contrast  Result Date:  11/15/2021 CLINICAL DATA:  Respiratory illness, nondiagnostic xray EXAM: CT CHEST WITHOUT CONTRAST TECHNIQUE: Multidetector CT imaging of the chest was performed following the standard protocol without IV contrast. RADIATION DOSE REDUCTION: This exam was performed according to the departmental dose-optimization program which includes automated exposure control, adjustment of the mA and/or kV according to patient size and/or use of iterative reconstruction technique. COMPARISON:  Radiograph earlier today.  Chest CT 08/26/2021 FINDINGS: Cardiovascular: The heart is normal in size. Thoracic aorta is normal in caliber. Upper normal pulmonary artery at 3.1 cm. No pericardial effusion. Mediastinum/Nodes: Small hiatal hernia. No bulky mediastinal adenopathy. Mediastinal assessment is limited due to motion none lack of contrast. There are prominent bilateral axillary nodes, 11 mm on the right and 10 mm on  the left. These are new from prior exam. Lungs/Pleura: Breathing motion artifact limits assessment. Advanced emphysema. Multifocal bronchiectasis, prominently affecting the left lower lobe where there is mucoid impaction and fluid levels throughout multiple dilated bronchi. Similar in appearance to prior exam. Bandlike atelectasis in the lateral right lower lobe. Faint tree-in-bud opacities in the right lower lobe, new. Nodular area of scarring/architectural distortion in the left upper lobe measuring 12 x 15 mm, series 8, image 54, stable from prior exam. Adjacent clustered left upper lobe nodules which likely represent scarring, stable. No pleural effusion. No endobronchial lesion. Upper Abdomen: Decreased hepatic density suggestive of steatosis. There is faint bilateral perinephric edema, similar to prior exam. Musculoskeletal: There are no acute or suspicious osseous abnormalities. Small amount of lobulated fluid/soft tissue density adjacent to the left pectoralis musculature in the high axilla, series 3, image 8. This  is new from prior. No internal air. IMPRESSION: 1. Faint tree-in-bud opacities in the right lower lobe are new from prior exam, likely infectious or inflammatory. 2. Background scarring and emphysema. This includes unchanged nodular area of scarring/architectural distortion in the left upper lobe. 3. Similar left lower lobe bronchiectasis with mucoid impaction and air-fluid levels. Suspect persistent infection. 4. Prominent bilateral axillary nodes are new from prior exam, likely reactive. Small amount of lobulated fluid/soft tissue density adjacent to the left pectoralis musculature in the high axilla is new from prior exam. No definite associated inflammation or soft tissue gas, recommend correlation with physical exam. Emphysema (ICD10-J43.9). Electronically Signed   By: Narda Rutherford M.D.   On: 11/15/2021 18:11   DG Chest 2 View  Result Date: 11/15/2021 CLINICAL DATA:  Suspected Sepsis EXAM: CHEST - 2 VIEW COMPARISON:  Most recent radiograph and CT 08/26/2021 FINDINGS: Emphysema and left greater than right lung scarring, similar to prior exam. No radiographic evidence of acute airspace disease. The heart is stable in size, unchanged mediastinal contours. Right hilar prominence corresponds to the pulmonary artery on prior CT. There may be a trace left pleural effusion. No pulmonary edema. No pneumothorax. IMPRESSION: 1. Possible trace left pleural effusion. 2. Emphysema and left greater than right lung scarring, similar to prior exam. No radiographic evidence of acute airspace disease. Electronically Signed   By: Narda Rutherford M.D.   On: 11/15/2021 15:17    Physical Exam  General: Pleasant, thin appearing middle-aged man laying in bed. No acute distress. Neck: Supple. No palpable lymphadenopathy CV: RRR. No m/r/g. No LE edema Pulmonary: Lungs CTAB. Normal effort.  Decreased air movement at the bases.  Distant bibasilar rales at the bases. Abdominal: Soft, NT/ND. Normal BS. Pink stoma with  greenish-brown stool in ostomy bag. MSK: No erythema, warmth or palpable masses on left shoulder and axilla.  Skin: Very dry skin. No lesions or rashes Neuro: A&Ox3. Moves all extremities. Normal sensation to gross touch.  Psych: Normal mood and affect   Assessment/Plan: [name] is a 51 y.o. 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 Blood cultures today positive for Listeria and staph epi in 1 of 2 sets. Patient hemodynamically stable and at baseline mental status. Presenting symptoms have improved including his nonproductive cough.  He remains afebrile with no leukocytosis.  ID consulted due to positive Listeria in blood culture in this high risk patient with HIV. -ID consulted, appreciate recs -Follow-up TTE, repeat blood culture -Start IV ampicillin 2 g every 4 hours -Closely monitor mental status   -Trend CBC, fever curve  #New axillary lymphadenopathy #Small loculated fluid versus soft tissue on imaging Patient CT chest on admission showed new bilateral axillary lymph nodes thought to be likely reactive.  The scan also showed a small lobulated fluid versus soft tissue density adjacent to the left pectoralis musculature high in the axilla. No palpable masses on his left axilla/shoulder on my exam.  Patient denies any B symptoms such as night sweats,  fatigue or loss of appetite. His weight has been stable since April 2023. Discussed findings with on-site radiologist to assist with the right imaging to do. He think this is likely a necrotic lymph node so he does not recommend an MRI but we can consider an ultrasound to rule out a fluid collection. -F/u US left axilla  #CAP #Chronic multifocal bronchiectasis #Advance emphysema Patient's respiratory status remained stable. States his cough is improved. He remains afebrile with no leukocytosis.  Respiratory viral panel negative. Urinary strep pneumo also negative.  Will de-escalate antibiotics in the setting of his clinical improvement.  Per ID, his Bactrim should cover any lung infection. -Discontinue cefepime and azithromycin -Discontinue IV fluids -Continue Anoro Ellipta, nebs   #HIV #Hx PJP pneumonia #Hx of pulmonary TB, treated Follows with [facility]. Last visit in June 2023. Last CD4 count at 104 and HIV viral load of 40 in June 2023.  HIV medications include Symtuza and Tivicay with prophylaxis daily Bactrim. Per EMR, patient has not filled his HIV medication since April 2023 however patient states he gets his medications in the mail and has been taking them daily. Last quanterferon gold was negative 5 years ago.  Viral panel and CD4 count still in process. -Continue Symtuza and Tivicay -Continue bactrim for PCP prophylaxis -Follow-up HIV viral load, CD4 count   #AKI on CKD 3B, resolved Baseline creatinine around 1.1-1.4. Creatinine down to 1.15 after IV fluid resuscitation.  We will discontinue IV fluids and encourage p.o. intake. -Discontinue IV fluids -Daily BMP -Avoid nephrotoxins agents   #Biventricular HF Last TTE in August 2022 showed mildly reduced LVEF of 45-50 with global hypokinesis. RV shows moderately reduced function with global hypokinesis. Patient slightly dry on exam. There is distant crackles at the bases but this is likely secondary to his chronic bronchiectasis.   Patient remains euvolemic on exam today. -Strict I&Os -Telemetry   #Hx of multiple prior DVT/PEs Has a history of two prior PE, one in 2017 in the right lung and one in 2022 in the left lung.  Had a negative CTA in June and denies any leg pain today. -Continue DVT prophylaxis with Lovenox   #Pruritic dermatitis Patient has had multiple clinic and ER evaluations for itching on arms, face and back to to be secondary to atopic dermatitis. He has tried steroid creams with some improvement.  -Continue triamcinolone cream 0.1% BID PRN for itching -PRN hydroxyzine 10 mg TID for itching   #Diet controlled T2DM A1c is 5.9% 3 months ago. CBGs significantly elevated to the 200s to 300s. We will modify his sliding  scale repeat A1c. -Change SSI from sensitive to moderate -Repeat A1c   #Vitamin D deficiency Patient found to have vitamin D levels of 12.2 during hospitalization in June. Started on high-dose vitamin D, 50,000 units weekly. Calcium down to 8.2, corrects to 9.4 with albumin of 2.5.  Vitamin D levels improved to 24.25. -Start vitamin D 1000 units daily.  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 Changes the ostomy bag every 2 days.  -Continue routine ostomy care  Diet: Carb modified IVF: None VTE: Lovenox CODE: Full code  Prior to Admission Living Arrangement: Home Anticipated Discharge Location: Home Barriers to Discharge: Adequate stability Dispo: Anticipated discharge in approximately 2-3 day(s).   Signed: Steffanie Rainwater, MD 11/16/2021, 2:39 PM  Pager: [phone] Internal Medicine Teaching Service After 5pm on weekdays and 1pm on weekends: On Call pager: [phone]

## 2021-11-16 NOTE — Progress Notes (Addendum)
PHARMACIST - PHYSICIAN COMMUNICATION DR:   Antony Contras CONCERNING: Antibiotic IV to Oral Route Change Policy  RECOMMENDATION: This patient is receiving azithromycin by the intravenous route.  Based on criteria approved by the Pharmacy and Therapeutics Committee, the antibiotic(s) is/are being converted to the equivalent oral dose form(s).   DESCRIPTION: These criteria include: Patient being treated for a respiratory tract infection, urinary tract infection, cellulitis or clostridium difficile associated diarrhea if on metronidazole The patient is not neutropenic and does not exhibit a GI malabsorption state The patient is eating (either orally or via tube) and/or has been taking other orally administered medications for a least 24 hours The patient is improving clinically and has a Tmax < 100.5  If you have questions about this conversion, please contact the Pharmacy Department  []   [phone] )  ( [phone] []   [phone] )  [facility] [x]   [phone] )  [facility] []   [phone] )  [facility] []   [phone] )  [facility]   Addendum  Pt now with listeria bacteremia. Ampicillin added. Dr. ( [phone] will optimize abx once he sees pt today.  [facility], PharmD, BCIDP, AAHIVP, CPP Infectious Disease Pharmacist 11/16/2021 2:53 PM

## 2021-11-16 NOTE — Progress Notes (Signed)
New Admission Note:    Arrival Method: ED stretcher Mental Orientation: AOx4 Telemetry: N/A Assessment: Completed Skin: See flowsheet IV: RH and LH Pain: 0/10 Tubes: n/a Safety Measures: Safety Fall Prevention Plan has been given, discussed and signed Admission: Completed [facility] Orientation: Patient has been orientated to the room, unit and staff.  Family: none at bedside   Orders have been reviewed and implemented. Will continue to monitor the patient. Call light has been placed within reach and bed alarm has been activated.

## 2021-11-17 ENCOUNTER — Inpatient Hospital Stay (HOSPITAL_COMMUNITY): Payer: BC Managed Care – PPO

## 2021-11-17 DIAGNOSIS — B20 Human immunodeficiency virus [HIV] disease: Secondary | ICD-10-CM | POA: Diagnosis not present

## 2021-11-17 DIAGNOSIS — R7881 Bacteremia: Secondary | ICD-10-CM

## 2021-11-17 DIAGNOSIS — A329 Listeriosis, unspecified: Secondary | ICD-10-CM | POA: Diagnosis not present

## 2021-11-17 LAB — BASIC METABOLIC PANEL
Anion gap: 8 (ref 5–15)
Anion gap: 11 (ref 5–15)
BUN: 29 mg/dL — ABNORMAL HIGH (ref 6–20)
BUN: 29 mg/dL — ABNORMAL HIGH (ref 6–20)
CO2: 22 mmol/L (ref 22–32)
CO2: 22 mmol/L (ref 22–32)
Calcium: 8.4 mg/dL — ABNORMAL LOW (ref 8.9–10.3)
Calcium: 8.9 mg/dL (ref 8.9–10.3)
Chloride: 109 mmol/L (ref 98–111)
Chloride: 105 mmol/L (ref 98–111)
Creatinine, Ser: 1.36 mg/dL — ABNORMAL HIGH (ref 0.61–1.24)
Creatinine, Ser: 1.37 mg/dL — ABNORMAL HIGH (ref 0.61–1.24)
GFR, Estimated: >60 mL/min (ref >60)
GFR, Estimated: >60 mL/min (ref >60)
Glucose, Bld: 235 mg/dL — ABNORMAL HIGH (ref 70–99)
Glucose, Bld: 237 mg/dL — ABNORMAL HIGH (ref 70–99)
Potassium: 3.8 mmol/L (ref 3.5–5.1)
Potassium: 4.1 mmol/L (ref 3.5–5.1)
Sodium: 139 mmol/L (ref 135–145)
Sodium: 138 mmol/L (ref 135–145)

## 2021-11-17 LAB — CBC WITH DIFFERENTIAL/PLATELET
Abs Immature Granulocytes: 0.06 10*3/uL (ref 0.00–0.07)
Abs Immature Granulocytes: 0.07 10*3/uL (ref 0.00–0.07)
Basophils Absolute: 0 10*3/uL (ref 0.0–0.1)
Basophils Absolute: 0 10*3/uL (ref 0.0–0.1)
Basophils Relative: 0 %
Basophils Relative: 0 %
Eosinophils Absolute: 0 10*3/uL (ref 0.0–0.5)
Eosinophils Absolute: 0 10*3/uL (ref 0.0–0.5)
Eosinophils Relative: 0 %
Eosinophils Relative: 0 %
HCT: 34 % — ABNORMAL LOW (ref 39.0–52.0)
HCT: 36.8 % — ABNORMAL LOW (ref 39.0–52.0)
Hemoglobin: 11.4 g/dL — ABNORMAL LOW (ref 13.0–17.0)
Hemoglobin: 12.5 g/dL — ABNORMAL LOW (ref 13.0–17.0)
Immature Granulocytes: 1 %
Immature Granulocytes: 1 %
Lymphocytes Relative: 16 %
Lymphocytes Relative: 17 %
Lymphs Abs: 1.2 10*3/uL (ref 0.7–4.0)
Lymphs Abs: 1.1 10*3/uL (ref 0.7–4.0)
MCH: 32.6 pg (ref 26.0–34.0)
MCH: 32.8 pg (ref 26.0–34.0)
MCHC: 33.5 g/dL (ref 30.0–36.0)
MCHC: 34 g/dL (ref 30.0–36.0)
MCV: 97.1 fL (ref 80.0–100.0)
MCV: 96.6 fL (ref 80.0–100.0)
Monocytes Absolute: 0.5 10*3/uL (ref 0.1–1.0)
Monocytes Absolute: 0.7 10*3/uL (ref 0.1–1.0)
Monocytes Relative: 7 %
Monocytes Relative: 12 %
Neutro Abs: 5.6 10*3/uL (ref 1.7–7.7)
Neutro Abs: 4.2 10*3/uL (ref 1.7–7.7)
Neutrophils Relative %: 76 %
Neutrophils Relative %: 70 %
Platelets: 146 10*3/uL — ABNORMAL LOW (ref 150–400)
Platelets: 186 10*3/uL (ref 150–400)
RBC: 3.5 MIL/uL — ABNORMAL LOW (ref 4.22–5.81)
RBC: 3.81 MIL/uL — ABNORMAL LOW (ref 4.22–5.81)
RDW: 12.8 % (ref 11.5–15.5)
RDW: 12.6 % (ref 11.5–15.5)
WBC: 7.4 10*3/uL (ref 4.0–10.5)
WBC: 6.1 10*3/uL (ref 4.0–10.5)
nRBC: 0.3 % — ABNORMAL HIGH (ref 0.0–0.2)
nRBC: 0 % (ref 0.0–0.2)

## 2021-11-17 LAB — ECHOCARDIOGRAM COMPLETE
Area-P 1/2: 4.21 cm2
Calc EF: 55.4 %
MV M vel: 5 m/s
MV Peak grad: 100 mmHg
Radius: 0.4 cm
S' Lateral: 3.8 cm
Single Plane A2C EF: 52.5 %
Single Plane A4C EF: 57.3 %
Weight: 2816 oz

## 2021-11-17 LAB — GLUCOSE, CAPILLARY
Glucose-Capillary: 193 mg/dL — ABNORMAL HIGH (ref 70–99)
Glucose-Capillary: 152 mg/dL — ABNORMAL HIGH (ref 70–99)
Glucose-Capillary: 188 mg/dL — ABNORMAL HIGH (ref 70–99)
Glucose-Capillary: 216 mg/dL — ABNORMAL HIGH (ref 70–99)

## 2021-11-17 LAB — HEMOGLOBIN A1C
Hgb A1c MFr Bld: 6.3 % — ABNORMAL HIGH (ref 4.8–5.6)
Mean Plasma Glucose: 134.11 mg/dL

## 2021-11-17 LAB — HIV-1 RNA QUANT-NO REFLEX-BLD
HIV 1 RNA Quant: 43100 copies/mL
LOG10 HIV-1 RNA: 4.634 log10copy/mL

## 2021-11-17 NOTE — Progress Notes (Signed)
Echocardiogram 2D Echocardiogram has been performed.  Augustine Radar 11/17/2021, 9:31 AM

## 2021-11-17 NOTE — Progress Notes (Addendum)
HD#2 Subjective:   Summary: This is a 51 year old male with a past medical history of HIV, CHF (EF 45 to 50%), type 2 diabetes, DVT/PE, treated pulmonary TB, PJP, emphysema, chronic bronchiectasis presenting with 1 day history of chills, cough and myalgias admitted for Listeria bacteremia.  Overnight Events: No overnight events  Patient is resting comfortably in bed upon my exam.  [employer] interpreters is used for history.  Patient states that he is doing well.  He states that his cough is getting better, and is not producing any sputum.  He denies any fever, chills, or rigors.  He states that he is urinating well and having bowel movements.  He reports a good appetite.  Patient denies any chest pain or shortness of breath.  He denies any abdominal pain.  Patient does ask about if he can get an oral antibiotic to go home on.  I state that we are waiting for some imaging, and repeat blood cultures to ensure that he is safe to go home.  He does endorse understanding about this.  Patient states no other concerns at this point.  Objective:  Vital signs in last 24 hours: Vitals:   11/16/21 2216 11/17/21 0544 11/17/21 0855 11/17/21 0947  BP: (!) 136/90 120/82  121/79  Pulse: 75 73  71  Resp: 17 17  18   Temp: 98.3 F (36.8 C) 98.4 F (36.9 C)  97.9 F (36.6 C)  TempSrc:    Oral  SpO2: 94% 93% 93% 93%  Weight:       Supplemental O2: Room Air SpO2: 93 %  Physical Exam:  Constitutional: Well-appearing, lying in bed in no acute distress HENT: normocephalic atraumatic Eyes: conjunctiva non-erythematous Cardiovascular: regular rate and rhythm, no m/r/g Pulmonary/Chest: Left sided sided crackles noted throughout.  Right lung with clear to auscultation noted. Abdominal: soft, non-tender, non-distended MSK: normal bulk and tone Skin: warm and dry Psych: Normal affect and mood  Filed Weights   11/16/21 1400  Weight: 79.8 kg    Intake/Output Summary (Last 24 hours) at 11/17/2021  1055 Last data filed at 11/17/2021 0600 Gross per 24 hour  Intake 2757.82 ml  Output 1625 ml  Net 1132.82 ml   Net IO Since Admission: 422.82 mL [11/17/21 1055]  Pertinent Labs:    Latest Ref Rng & Units 11/17/2021    2:47 AM 11/16/2021    4:56 AM 11/15/2021    3:33 PM  CBC  WBC 4.0 - 10.5 K/uL 7.4  3.7  8.6   Hemoglobin 13.0 - 17.0 g/dL 01/15/2022  79.8  92.1   Hematocrit 39.0 - 52.0 % 34.0  37.7  41.1   Platelets 150 - 400 K/uL 146  144  154        Latest Ref Rng & Units 11/17/2021    2:47 AM 11/16/2021    4:56 AM 11/15/2021    3:33 PM  CMP  Glucose 70 - 99 mg/dL 01/15/2022  174  081   BUN 6 - 20 mg/dL 29  22  24    Creatinine 0.61 - 1.24 mg/dL 448   1.85   Sodium 135 - 145 mmol/L 139  136  138   Potassium 3.5 - 5.1 mmol/L 3.8  3.3  3.2   Chloride 98 - 111 mmol/L 109  103  98   CO2 22 - 32 mmol/L 22  22  22    Calcium 8.9 - 10.3 mg/dL 8.4  8.2  8.7   Total Protein 6.5 - 8.1  g/dL   7.7   Total Bilirubin 0.3 - 1.2 mg/dL   1.2   Alkaline Phos 38 - 126 U/L   46   AST 15 - 41 U/L   93   ALT 0 - 44 U/L   47     Imaging: No results found.  Assessment/Plan:   Principal Problem:   Bacteremia Active Problems:   HIV disease ([name])   AIDS (acquired immune deficiency syndrome) ([name])   Pneumonia   Pulmonary emphysema ([name])   Bronchiectasis without acute exacerbation ([name])   Listeriosis   Patient Summary: [name] is a 51 y.o. with a past medical history of HIV, CHF (EF 45 to 50%), type 2 diabetes, DVT/PE, treated pulmonary TB, PJP, emphysema, chronic bronchiectasis presenting with 1 day history of chills, cough and myalgias admitted for Listeria bacteremia.   #Listeria bacteremia -Initial blood cultures drawn on 11/15/2021 showing evidence of Listeria and staph epi (likely contamination) -Likely etiology for chills, malaise, and myalgias -Initially patient was given a dose of ceftriaxone in the emergency department as well as azithromycin -On admission, patient was  transitioned to cefepime and azithromycin -Given positive blood cultures, patient was started on ampicillin, and stopped on cefepime and azithromycin -Currently no concerns of chest pain or shortness of breath -Patient continues to remain afebrile, with normal white count noted of 7.6 today -ID following who recommends ampicillin until repeat blood cultures are resulted -Repeat blood cultures still pending -TTE pending -Continue IV ampicillin 2 g every 4 hours -Monitor CBC -Continue to monitor mental status  #New axillary lymphadenopathy -Initial CT on 11/15/2021 showing bilateral axillary lymph nodes thought to be reactive as well as small loculated fluid versus soft tissue density next to left pectoralis musculature high in the axilla -Unsure if this is fluid collection or abscess -Dr. Kirke Corin had discussion with onsite radiologist who states that it could be a necrotic lymph node in an ultrasound could be warranted -Left axillary ultrasound pending  #Chronic multifocal bronchiectasis #Emphysema -Patient at baseline has bronchiectasis and emphysema -This has not been exacerbation during hospitalization -Patient is on home Anoro Ellipta  -Patient also on DuoNebs as needed -Patient currently without shortness of breath and is satting well at 93% on room air -Continue to monitor respiratory status  #HIV  #History PJP pneumonia  #History of pulmonary tuberculosis, treated #CAP -11/16/2021 strep pneumo urine antigen negative -Legionella antigen pending -08/25/2021 CD4 T cells 104 and HIV viral load at 40 on Symtuza and Tivicay -Patient on Bactrim at home for PCP pneumonia prophylaxis -Patient does endorse a cough, and patient was initially started on azithromycin and cefepime on admission for CAP coverage. -11/15/2021 chest x-ray showing trace pleural effusion  -11/15/2021 CT Chest showing some tree in opacities that could be infectious or inflammatory  -Cefepime and azithromycin  were discontinued on 11/16/2021 -ID following stating that Bactrim the patient is already on cover any lung infections -Continue PCP prophylaxis with Bactrim -Continue Symtuza and TBK -HIV viral load and CD4 count pending  #AKI likely prerenal, resolved -Initial creatinine on 11/15/2021 1.61 which is elevated from baseline of 1.15. -Patient was given fluids, and creatinine decreased to 1.36 today -Initially, it showed that the patient had a history of CKD, but upon further chart review it seems that his GFR only drops during acute kidney injuries, and bounces back afterwards.  Per my chart review, I do not think this patient has chronic kidney disease going back to 2022 -Monitor BMP  #Biventricular heart failure -Last  TTE in August 2022 showing mildly reduced left ejection fraction of 45 to 50% with global hypokinesis.  Right ventricular showing moderately reduced function with global hypokinesis -Patient does have some crackles to his left base likely secondary to bronchiectasis -Patient does not seem hypervolemic -Patient showing no respiratory distress at this time -Continue to monitor respiratory status -Strict I's and O's -Telemetry  #History of DVT/PE -Most recent PE in 2022 -No concern for DVTs at this point -Continue Lovenox  #Dermatitis -History of itching to arms, face, and back likely secondary to atopic dermatitis -Patient denies any itching at this time -Patient is on triamcinolone cream twice daily as needed for itching -As needed hydroxyzine 10 mg 3 times daily for itching  #Type 2 diabetes, diet controlled -Patient's A1c today resulted at 6.3 -Blood sugars have been ranging elevated in the 200s -Given that A1c is 6.3 today, increase sliding scale insulin to moderate -Continue to monitor blood sugars -Monitor for hypoglycemia episodes  #Vitamin D deficiency -Vitamin D level 24.25 initially on admission on 11/15/2021 -Continue vitamin D 1000 IU  daily  #Hyperlipidemia -Continue home Crestor 20 mg daily  Diet: Carb-Modified IVF: N/A VTE: Enoxaparin Code: Full  Dispo: Anticipated discharge to Home in 1 days pending Clinical improvement.   Modena Slater DO Internal Medicine Resident PGY-1 [phone] Please contact the on call pager after 5 pm and on weekends at [phone].

## 2021-11-17 NOTE — Progress Notes (Signed)
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 Bcx so far 1 of 2 set listeria and staph epi (which would be a contaminant). But listeria would need to be treated and I suspect it is a primary driver in his case... AIDS patients are at risk for this    While vanc has potentially some in vitro activity against listeria, it is not a standard of tx. Perhaps his improvement has something to do with this, but dedicated tx would be ampicillin or trim-sulfa.      Given quick defervescence, I have low suspicion for metastatic complication such as endocarditis. While listeria is neurotropic, his headache had resolved rather quickly. Treatment would be the same for cns/bacteremia and I do not feel LP would add any benefit.   However, repeat bcx and at least a tte would be reasonable given high mortality with listeria bsi and higher risk of endocarditis in this patient population      His hiv has been well controlled and he is compliant at least for the last several months based on his 08/2021 hiv check and current self report. Repeat viral load/cd4 is in process     He reports no symptomatology of pna to me although it was mentioned in primary team h&p. His ct had for the most part remained the same outside of axillary adenopathy that is more pronounced. This will need to be followed up. Given his cd4 count, the ddx for this can be wide including hiv, lymphoma, reactive to listeria infection (rare), or other fungal/mycobacteria oi. He  doesn't report a story of chronic b - sx or tb relapse at this time    ------- 11/17/21 Continues to feel well 9/3 repeat bcx ngtd No sign of meningitis or gastroenteritis 9/4 tte unconcerning   Plan: F/u repeat bcx Continue ampicillin for now Tomorrow if bcx remains negative and he remains well could dc and finish 2 week course of listeria bsi tx with high dose trim-sulfa ds (2 tab bid) Oncoming id team will make f/u for him in [facility] in 1 week after discharge Discussed with primary team   I spent more than 35 minute reviewing data/chart, and coordinating care and >50% direct face to face time providing counseling/discussing diagnostics/treatment plan with patient   Principal Problem:   Bacteremia Active Problems:   HIV disease ([name])   AIDS (acquired immune deficiency syndrome) ([name])   Pneumonia   Pulmonary emphysema ([name])   Bronchiectasis without acute exacerbation ([name])   Listeriosis   Allergies  Allergen Reactions   Metformin And Related Swelling and Rash    Pt doesn't remember this allergy, but does state he gets rash. Unsure if related to medication.    Scheduled Meds:  aspirin EC  81 mg  Oral Daily   cholecalciferol  1,000 Units Oral Daily   Darunavir-Cobicistat-Emtricitabine-Tenofovir Alafenamide  1 tablet Oral Q breakfast   dolutegravir  50 mg Oral Daily   enoxaparin (LOVENOX) injection  40 mg Subcutaneous Q24H   insulin aspart  0-15 Units Subcutaneous TID WC   rosuvastatin  20 mg Oral Daily   sulfamethoxazole-trimethoprim  1 tablet Oral Daily   umeclidinium-vilanterol  1 puff Inhalation Daily   Continuous Infusions:  ampicillin (OMNIPEN) IV Stopped (11/17/21 1247)   PRN Meds:.acetaminophen **OR** acetaminophen, guaiFENesin, hydrOXYzine, ipratropium-albuterol, ondansetron **OR** ondansetron (ZOFRAN) IV, senna-docusate, triamcinolone cream   SUBJECTIVE: No compaint Feels 100%  Hx via language interpreter phone  Review of Systems: ROS All other ROS  was negative, except mentioned above     OBJECTIVE: Vitals:   11/16/21 2216 11/17/21 0544 11/17/21 0855 11/17/21 0947  BP: (!) 136/90 120/82  121/79  Pulse: 75 73  71  Resp: 17 17  18   Temp: 98.3 F (36.8 C) 98.4 F (36.9 C)  97.9 F (36.6 C)  TempSrc:    Oral  SpO2: 94% 93% 93% 93%  Weight:       Body mass index is 28.41 kg/m.  Physical Exam General/constitutional: no distress, pleasant HEENT: Normocephalic, PER, Conj Clear, EOMI, Oropharynx clear Neck supple CV: rrr no mrg Lungs: clear to auscultation, normal respiratory effort Abd: Soft, Nontender Ext: no edema Skin: old scabbing lle distally laterally  Neuro: nonfocal MSK: no peripheral joint swelling/tenderness/warmth; back spines nontender     Lab Results Lab Results  Component Value Date   WBC 7.4 11/17/2021   HGB 11.4 (L) 11/17/2021   HCT 34.0 (L) 11/17/2021   MCV 97.1 11/17/2021   PLT 146 (L) 11/17/2021    Lab Results  Component Value Date   CREATININE 1.36 (H) 11/17/2021   BUN 29 (H) 11/17/2021   NA 139 11/17/2021   K 3.8 11/17/2021   CL 109 11/17/2021   CO2 22 11/17/2021    Lab Results  Component Value Date   ALT 47 (H) 11/15/2021   AST 93 (H) 11/15/2021   ALKPHOS 46 11/15/2021   BILITOT 1.2 11/15/2021      Microbiology: Recent Results (from the past 240 hour(s))  Culture, blood (Routine x 2)     Status: Abnormal (Preliminary result)   Collection Time: 11/15/21  2:05 PM   Specimen: BLOOD RIGHT HAND  Result Value Ref Range Status   Specimen Description BLOOD RIGHT HAND  Final   Special Requests   Final    BOTTLES DRAWN AEROBIC AND ANAEROBIC Blood Culture adequate volume   Culture  Setup Time   Final    GRAM POSITIVE COCCI IN CLUSTERS IN BOTH AEROBIC AND ANAEROBIC BOTTLES GRAM POSITIVE RODS CRITICAL RESULT CALLED TO, READ BACK BY AND VERIFIED WITH11/02/23 PHARMD, AT 1401 11/16/21 D. VANHOOK    Culture (A)  Final    STAPHYLOCOCCUS EPIDERMIDIS THE SIGNIFICANCE OF ISOLATING THIS  ORGANISM FROM A SINGLE SET OF BLOOD CULTURES WHEN MULTIPLE SETS ARE DRAWN IS UNCERTAIN. PLEASE NOTIFY THE MICROBIOLOGY DEPARTMENT WITHIN ONE WEEK IF SPECIATION AND SENSITIVITIES ARE REQUIRED. LISTERIA MONOCYTOGENES Recommended therapy: ampicillin with or without an aminoglycoside. Cephalosporins are not effective. Performed at [facility], [address] [place], [place]    Report Status PENDING  Incomplete  Blood Culture ID Panel (Reflexed)     Status: Abnormal   Collection Time: 11/15/21  2:05 PM  Result Value Ref Range Status   Enterococcus faecalis NOT DETECTED NOT  DETECTED Final   Enterococcus Faecium NOT DETECTED NOT DETECTED Final   Listeria monocytogenes DETECTED (A) NOT DETECTED Final    Comment: CRITICAL RESULT CALLED TO, READ BACK BY AND VERIFIED WITHAudry Pili PHARMD, AT 1401 11/16/21 D. VANHOOK    Staphylococcus species DETECTED (A) NOT DETECTED Final    Comment: CRITICAL RESULT CALLED TO, READ BACK BY AND VERIFIED WITHAudry Pili PHARMD, AT 1401 11/16/21 D. VANHOOK    Staphylococcus aureus (BCID) NOT DETECTED NOT DETECTED Final   Staphylococcus epidermidis DETECTED (A) NOT DETECTED Final    Comment: Methicillin (oxacillin) resistant coagulase negative staphylococcus. Possible blood culture contaminant (unless isolated from more than one blood culture draw or clinical case suggests pathogenicity). No antibiotic treatment is indicated for blood  culture contaminants. CRITICAL RESULT CALLED TO, READ BACK BY AND VERIFIED WITHAudry Pili PHARMD, AT 1401 11/16/21 D. VANHOOK    Staphylococcus lugdunensis NOT DETECTED NOT DETECTED Final   Streptococcus species NOT DETECTED NOT DETECTED Final   Streptococcus agalactiae NOT DETECTED NOT DETECTED Final   Streptococcus pneumoniae NOT DETECTED NOT DETECTED Final   Streptococcus pyogenes NOT DETECTED NOT DETECTED Final   A.calcoaceticus-baumannii NOT DETECTED NOT DETECTED Final   Bacteroides fragilis NOT DETECTED NOT  DETECTED Final   Enterobacterales NOT DETECTED NOT DETECTED Final   Enterobacter cloacae complex NOT DETECTED NOT DETECTED Final   Escherichia coli NOT DETECTED NOT DETECTED Final   Klebsiella aerogenes NOT DETECTED NOT DETECTED Final   Klebsiella oxytoca NOT DETECTED NOT DETECTED Final   Klebsiella pneumoniae NOT DETECTED NOT DETECTED Final   Proteus species NOT DETECTED NOT DETECTED Final   Salmonella species NOT DETECTED NOT DETECTED Final   Serratia marcescens NOT DETECTED NOT DETECTED Final   Haemophilus influenzae NOT DETECTED NOT DETECTED Final   Neisseria meningitidis NOT DETECTED NOT DETECTED Final   Pseudomonas aeruginosa NOT DETECTED NOT DETECTED Final   Stenotrophomonas maltophilia NOT DETECTED NOT DETECTED Final   Candida albicans NOT DETECTED NOT DETECTED Final   Candida auris NOT DETECTED NOT DETECTED Final   Candida glabrata NOT DETECTED NOT DETECTED Final   Candida krusei NOT DETECTED NOT DETECTED Final   Candida parapsilosis NOT DETECTED NOT DETECTED Final   Candida tropicalis NOT DETECTED NOT DETECTED Final   Cryptococcus neoformans/gattii NOT DETECTED NOT DETECTED Final   Methicillin resistance mecA/C DETECTED (A) NOT DETECTED Final    Comment: CRITICAL RESULT CALLED TO, READ BACK BY AND VERIFIED WITHAudry Pili PHARMD, AT 1401 11/16/21 D. VANHOOK Performed at [facility], [address], [place], [place] [address]   Culture, blood (Routine x 2)     Status: Abnormal (Preliminary result)   Collection Time: 11/15/21  2:10 PM   Specimen: BLOOD  Result Value Ref Range Status   Specimen Description BLOOD RIGHT ANTECUBITAL  Final   Special Requests   Final    BOTTLES DRAWN AEROBIC AND ANAEROBIC Blood Culture results may not be optimal due to an inadequate volume of blood received in culture bottles   Culture  Setup Time   Final    GRAM POSITIVE RODS AEROBIC BOTTLE ONLY CRITICAL VALUE NOTED.  VALUE IS CONSISTENT WITH PREVIOUSLY REPORTED AND CALLED VALUE.     Culture (A)  Final    LISTERIA MONOCYTOGENES Recommended therapy: ampicillin with or without an aminoglycoside. Cephalosporins are not effective. Performed at [facility], [address], [place], [place] [address]    Report Status PENDING  Incomplete  Resp Panel by RT-PCR (Flu A&B, Covid) Anterior  Nasal Swab     Status: None   Collection Time: 11/15/21  2:47 PM   Specimen: Anterior Nasal Swab  Result Value Ref Range Status   SARS Coronavirus 2 by RT PCR NEGATIVE NEGATIVE Final    Comment: (NOTE) SARS-CoV-2 target nucleic acids are NOT DETECTED.  The SARS-CoV-2 RNA is generally detectable in upper respiratory specimens during the acute phase of infection. The lowest concentration of SARS-CoV-2 viral copies this assay can detect is 138 copies/mL. A negative result does not preclude SARS-Cov-2 infection and should not be used as the sole basis for treatment or other patient management decisions. A negative result may occur with  improper specimen collection/handling, submission of specimen other than nasopharyngeal swab, presence of viral mutation(s) within the areas targeted by this assay, and inadequate number of viral copies(<138 copies/mL). A negative result must be combined with clinical observations, patient history, and epidemiological information. The expected result is Negative.  Fact Sheet for Patients:  BloggerCourse.comhttps://www.fda.gov/media/152166/download  Fact Sheet for Healthcare Providers:  SeriousBroker.ithttps://www.fda.gov/media/152162/download  This test is no t yet approved or cleared by the [place] FDA and  has been authorized for detection and/or diagnosis of SARS-CoV-2 by FDA under an Emergency Use Authorization (EUA). This EUA will remain  in effect (meaning this test can be used) for the duration of the COVID-19 declaration under Section 564(b)(1) of the Act, 21 U.S.C.section 360bbb-3(b)(1), unless the authorization is terminated  or revoked sooner.       Influenza  A by PCR NEGATIVE NEGATIVE Final   Influenza B by PCR NEGATIVE NEGATIVE Final    Comment: (NOTE) The Xpert Xpress SARS-CoV-2/FLU/RSV plus assay is intended as an aid in the diagnosis of influenza from Nasopharyngeal swab specimens and should not be used as a sole basis for treatment. Nasal washings and aspirates are unacceptable for Xpert Xpress SARS-CoV-2/FLU/RSV testing.  Fact Sheet for Patients: BloggerCourse.comhttps://www.fda.gov/media/152166/download  Fact Sheet for Healthcare Providers: SeriousBroker.ithttps://www.fda.gov/media/152162/download  This test is not yet approved or cleared by the [place] FDA and has been authorized for detection and/or diagnosis of SARS-CoV-2 by FDA under an Emergency Use Authorization (EUA). This EUA will remain in effect (meaning this test can be used) for the duration of the COVID-19 declaration under Section 564(b)(1) of the Act, 21 U.S.C. section 360bbb-3(b)(1), unless the authorization is terminated or revoked.  Performed at [facility], [address] [place], [place] [id number]   Respiratory (~20 pathogens) panel by PCR     Status: None   Collection Time: 11/15/21  2:47 PM   Specimen: Nasopharyngeal Swab; Respiratory  Result Value Ref Range Status   Adenovirus NOT DETECTED NOT DETECTED Final   Coronavirus 229E NOT DETECTED NOT DETECTED Final    Comment: (NOTE) The Coronavirus on the Respiratory Panel, DOES NOT test for the novel  Coronavirus (2019 nCoV)    Coronavirus HKU1 NOT DETECTED NOT DETECTED Final   Coronavirus NL63 NOT DETECTED NOT DETECTED Final   Coronavirus OC43 NOT DETECTED NOT DETECTED Final   Metapneumovirus NOT DETECTED NOT DETECTED Final   Rhinovirus / Enterovirus NOT DETECTED NOT DETECTED Final   Influenza A NOT DETECTED NOT DETECTED Final   Influenza B NOT DETECTED NOT DETECTED Final   Parainfluenza Virus 1 NOT DETECTED NOT DETECTED Final   Parainfluenza Virus 2 NOT DETECTED NOT DETECTED Final   Parainfluenza Virus 3 NOT DETECTED  NOT DETECTED Final   Parainfluenza Virus 4 NOT DETECTED NOT DETECTED Final   Respiratory Syncytial Virus NOT DETECTED NOT DETECTED Final  Bordetella pertussis NOT DETECTED NOT DETECTED Final   Bordetella Parapertussis NOT DETECTED NOT DETECTED Final   Chlamydophila pneumoniae NOT DETECTED NOT DETECTED Final   Mycoplasma pneumoniae NOT DETECTED NOT DETECTED Final    Comment: Performed at [facility], [address], [place], [place] [address]  Culture, blood (Routine X 2) w Reflex to ID Panel     Status: None (Preliminary result)   Collection Time: 11/16/21  4:45 PM   Specimen: BLOOD  Result Value Ref Range Status   Specimen Description BLOOD RIGHT ANTECUBITAL  Final   Special Requests   Final    BOTTLES DRAWN AEROBIC AND ANAEROBIC Blood Culture results may not be optimal due to an inadequate volume of blood received in culture bottles   Culture   Final    NO GROWTH < 24 HOURS Performed at [facility], [address] [place], [place] [address]    Report Status PENDING  Incomplete  Culture, blood (Routine X 2) w Reflex to ID Panel     Status: None (Preliminary result)   Collection Time: 11/16/21  4:46 PM   Specimen: BLOOD RIGHT HAND  Result Value Ref Range Status   Specimen Description BLOOD RIGHT HAND  Final   Special Requests   Final    BOTTLES DRAWN AEROBIC AND ANAEROBIC Blood Culture results may not be optimal due to an inadequate volume of blood received in culture bottles   Culture   Final    NO GROWTH < 24 HOURS Performed at [facility], [address], [place], [place] [address]    Report Status PENDING  Incomplete     Serology:   Imaging: If present, new imagings (plain films, ct scans, and mri) have been personally visualized and interpreted; radiology reports have been reviewed. Decision making incorporated into the Impression / Recommendations.  9/4 tte  1. Left ventricular ejection fraction, by estimation, is 50%. The left  ventricle has  low normal function. The left ventricle has no regional wall motion abnormalities. Left ventricular diastolic parameters were normal.   2. Right ventricular systolic function is normal. The right ventricular  size is mildly enlarged. There is normal pulmonary artery systolic  pressure. The estimated right ventricular systolic pressure is 22.9 mmHg.   3. The mitral valve is grossly normal. Mild to moderate mitral valve  regurgitation. No evidence of mitral stenosis.   4. The aortic valve is tricuspid. Aortic valve regurgitation is not  visualized. No aortic stenosis is present.   5. The inferior vena cava is normal in size with greater than 50%  respiratory variability, suggesting right atrial pressure of 3 mmHg.   Conclusion(s)/Recommendation(s): No evidence of valvular vegetations on this transthoracic echocardiogram. Consider a transesophageal echocardiogram to exclude infective endocarditis if clinically indicated.   Raymondo Band, MD [facility] [phone] pager    11/17/2021, 2:52 PM

## 2021-11-17 NOTE — Consult Note (Signed)
WOC Nurse ostomy consult note Patient receiving care in [facility] 5M10. Stoma type/location: LUQ colostomy Stomal assessment/size: red, moist, budded; 1 3/8 inches as measured through existing pouch. Peristomal assessment: deferred; current pouch does not need changing Treatment options for stomal/peristomal skin: barrier ring Output: soft brown Ostomy pouching: 1pc. Patient uses ostomy pouch [employer] [place] [phone] and barrier ring [employer] [place] [phone] Education provided: none; patient has been self sufficient for some time now. I have asked the [place] to order the ostomy supplies listed above for primary nurse use.  WOC nurse will not follow at this time.  Please re-consult the WOC team if needed.  Helmut Muster, RN, MSN, CWOCN, CNS-BC, pager [phone]

## 2021-11-17 NOTE — Plan of Care (Signed)
  Problem: Nutritional: Goal: Maintenance of adequate nutrition will improve Outcome: Progressing   Problem: Tissue Perfusion: Goal: Adequacy of tissue perfusion will improve Outcome: Progressing   Problem: Activity: Goal: Ability to tolerate increased activity will improve Outcome: Progressing

## 2021-11-18 ENCOUNTER — Other Ambulatory Visit (HOSPITAL_COMMUNITY): Payer: Self-pay

## 2021-11-18 DIAGNOSIS — B20 Human immunodeficiency virus [HIV] disease: Secondary | ICD-10-CM | POA: Diagnosis not present

## 2021-11-18 DIAGNOSIS — I889 Nonspecific lymphadenitis, unspecified: Secondary | ICD-10-CM

## 2021-11-18 DIAGNOSIS — R7881 Bacteremia: Secondary | ICD-10-CM | POA: Diagnosis not present

## 2021-11-18 DIAGNOSIS — A329 Listeriosis, unspecified: Secondary | ICD-10-CM | POA: Diagnosis not present

## 2021-11-18 DIAGNOSIS — J439 Emphysema, unspecified: Secondary | ICD-10-CM | POA: Diagnosis not present

## 2021-11-18 LAB — LACTATE DEHYDROGENASE: LDH: 161 U/L (ref 98–192)

## 2021-11-18 LAB — BASIC METABOLIC PANEL
Anion gap: 7 (ref 5–15)
BUN: 26 mg/dL — ABNORMAL HIGH (ref 6–20)
CO2: 25 mmol/L (ref 22–32)
Calcium: 8.7 mg/dL — ABNORMAL LOW (ref 8.9–10.3)
Chloride: 105 mmol/L (ref 98–111)
Creatinine, Ser: 1.28 mg/dL — ABNORMAL HIGH (ref 0.61–1.24)
GFR, Estimated: >60 mL/min (ref >60)
Glucose, Bld: 186 mg/dL — ABNORMAL HIGH (ref 70–99)
Potassium: 3.9 mmol/L (ref 3.5–5.1)
Sodium: 137 mmol/L (ref 135–145)

## 2021-11-18 LAB — CULTURE, BLOOD (ROUTINE X 2)

## 2021-11-18 LAB — CBC
HCT: 37.3 % — ABNORMAL LOW (ref 39.0–52.0)
Hemoglobin: 12.7 g/dL — ABNORMAL LOW (ref 13.0–17.0)
MCH: 33.2 pg (ref 26.0–34.0)
MCHC: 34 g/dL (ref 30.0–36.0)
MCV: 97.4 fL (ref 80.0–100.0)
Platelets: 182 10*3/uL (ref 150–400)
RBC: 3.83 MIL/uL — ABNORMAL LOW (ref 4.22–5.81)
RDW: 12.7 % (ref 11.5–15.5)
WBC: 5.4 10*3/uL (ref 4.0–10.5)
nRBC: 0 % (ref 0.0–0.2)

## 2021-11-18 LAB — GLUCOSE, CAPILLARY
Glucose-Capillary: 137 mg/dL — ABNORMAL HIGH (ref 70–99)
Glucose-Capillary: 98 mg/dL (ref 70–99)
Glucose-Capillary: 134 mg/dL — ABNORMAL HIGH (ref 70–99)
Glucose-Capillary: 128 mg/dL — ABNORMAL HIGH (ref 70–99)

## 2021-11-18 LAB — T-HELPER CELLS (CD4) COUNT (NOT AT ARMC)
CD4 % Helper T Cell: 8 % — ABNORMAL LOW (ref 33–65)
CD4 T Cell Abs: 95 /uL — ABNORMAL LOW (ref 400–1790)

## 2021-11-18 MED ORDER — ANORO ELLIPTA 62.5-25 MCG/ACT IN AEPB
1.0000 | INHALATION_SPRAY | Freq: Every day | RESPIRATORY_TRACT | 2 refills | Status: DC
  Filled 2021-11-18: qty 60, 60d supply, fill #0

## 2021-11-18 MED ORDER — SULFAMETHOXAZOLE-TRIMETHOPRIM 800-160 MG PO TABS
2.0000 | ORAL_TABLET | Freq: Two times a day (BID) | ORAL | Status: DC
  Administered 2021-11-18 – 2021-11-19 (×3): 2 via ORAL
  Filled 2021-11-18 (×3): qty 2

## 2021-11-18 MED ORDER — VITAMIN D3 25 MCG PO TABS
1000.0000 [IU] | ORAL_TABLET | Freq: Every day | ORAL | 2 refills | Status: DC
  Filled 2021-11-18: qty 30, 30d supply, fill #0

## 2021-11-18 MED ORDER — SULFAMETHOXAZOLE-TRIMETHOPRIM 800-160 MG PO TABS
2.0000 | ORAL_TABLET | Freq: Two times a day (BID) | ORAL | 0 refills | Status: AC
  Filled 2021-11-18: qty 56, 14d supply, fill #0

## 2021-11-18 MED ORDER — ROSUVASTATIN CALCIUM 20 MG PO TABS
20.0000 mg | ORAL_TABLET | Freq: Every day | ORAL | 11 refills | Status: DC
  Filled 2021-11-18: qty 30, 30d supply, fill #0

## 2021-11-18 MED ORDER — TIVICAY 50 MG PO TABS
ORAL_TABLET | ORAL | 5 refills | Status: DC
  Filled 2021-11-18: qty 30, 30d supply, fill #0

## 2021-11-18 MED ORDER — SYMTUZA 800-150-200-10 MG PO TABS
ORAL_TABLET | ORAL | 5 refills | Status: DC
  Filled 2021-11-18: qty 30, 30d supply, fill #0

## 2021-11-18 NOTE — Discharge Summary (Incomplete)
Name: [name] MRN: [id number] DOB: 01/10/1971 51 y.o. PCP: Marolyn Hallerarter, [place], [place]  Date of Admission: 11/15/2021  1:59 PM Date of Discharge: 11/19/2021 Attending Physician: Dr. Lafonda MossesMachen   Discharge Diagnosis: Principal Problem:   Bacteremia Active Problems:   HIV disease ([name])   AIDS (acquired immune deficiency syndrome) ([name])   Pneumonia   Pulmonary emphysema ([name])   Bronchiectasis without acute exacerbation ([name])   Listeriosis    Discharge Medications: Allergies as of 11/19/2021       Reactions   Metformin And Related Swelling, Rash   Pt doesn't remember this allergy, but does state he gets rash. Unsure if related to medication.        Medication List     STOP taking these medications    hydrocortisone 2.5 % lotion   OXYGEN   predniSONE 10 MG tablet Commonly known as: DELTASONE   triamcinolone oint 0.025%-Cerave equivalent cream 1:1 mixture   Vitamin D (Ergocalciferol) 1.25 MG (50000 UNIT) Caps capsule Commonly known as: DRISDOL       TAKE these medications    albuterol 108 (90 Base) MCG/ACT inhaler Commonly known as: VENTOLIN HFA Inhale 2 puffs into the lungs every 6 (six) hours as needed for wheezing or shortness of breath.   Anoro Ellipta 62.5-25 MCG/ACT Aepb Generic drug: umeclidinium-vilanterol Inhale 1 puff into the lungs daily.   aspirin EC 81 MG tablet Take 1 tablet (81 mg total) by mouth daily. Swallow whole.   cetirizine 5 MG tablet Commonly known as: ZYRTEC Take 1 tablet (5 mg total) by mouth daily.   guaiFENesin 100 MG/5ML liquid Commonly known as: ROBITUSSIN Take 5 mLs by mouth every 4 (four) hours as needed for cough or to loosen phlegm. What changed:  when to take this reasons to take this   ibuprofen 200 MG tablet Commonly known as: ADVIL Take 400 mg by mouth every 6 (six) hours as needed for mild pain, fever or headache.   rosuvastatin 20 MG tablet Commonly known as: Crestor Take 1 tablet (20 mg total) by mouth  daily.   sulfamethoxazole-trimethoprim 800-160 MG tablet Commonly known as: BACTRIM DS Take 2 tablets by mouth every 12 (twelve) hours for 14 days. What changed:  how much to take when to take this   Symtuza 800-150-200-10 MG Tabs Generic drug: Darunavir-Cobicistat-Emtricitabine-Tenofovir Alafenamide TAKE 1 TABLET BY MOUTH 1 TIME A DAY WITH BREAKFAST   Tivicay 50 MG tablet Generic drug: dolutegravir TAKE ONE TABLET BY MOUTH ONCE DAILY. STORE AT ROOM TEMPERATURE   triamcinolone cream 0.1 % Commonly known as: KENALOG Apply topically 2 (two) times daily as needed (itching).   vitamin D3 25 MCG tablet Commonly known as: CHOLECALCIFEROL Take 1 tablet (1,000 Units total) by mouth daily.        Disposition and follow-up:   [name].  At the hospital follow up visit please address:  1.  Follow-up:  a.  Listeria bacteremia: Patient found to have positive blood culture with Listeria admission.  Patient received 3 days of treatment ampicillin and was discontinued after repeat blood cultures negative.  Patient will be discharged on Bactrim 800 mg twice daily for the next 2 weeks.  Consider follow-up BMP to monitor kidney function and potassium as Bactrim has been started.    b.  Left axillary lymphadenopathy: Patient had a small fluid collection found on the left axilla on ultrasound.  ID following, recommended LDH for concern of TB.  LDH negative.  Follow-up outpatient.   c.  HIV: Follow-up on admission elevated at 43,000 and CD4 count 94.  Patient still on Symtuza and Tivicay.  Follow-up with infectious disease outpatient for optimization of antiretroviral treatment.   D.  Chronic bronchiectasis/emphysema: Patient has follow-up appointment with Dr. Vassie Loll of pulmonology for further outpatient management.  Patient to continue at home Anoro Ellipta 1 puff daily and albuterol 2 puffs every 6 as needed.  Follow-up  on wheezing as well as respiratory status.  2.  Labs / imaging needed at time of follow-up: BMP as Bactrim was started 3.  Pending labs/ test needing follow-up: N/A 4.  Medication Changes  1) Patient started on Bactrim 800 mg twice daily for the next 2 weeks  Follow-up Appointments:  Follow-up Information     [facility]. Go on 11/27/2021.   Why: Follow-up for kidney function test with Dr. Welton Flakes @ 145pm Contact information: [address] [name] [address] [phone]        Oretha Milch, MD. Go on 12/15/2021.   Specialty: Pulmonary Disease Why: Appointment at 9 AM on 12/15/2021 Contact information: [address] [place] [address] [phone]         Judyann Munson, MD. Go on 12/15/2021.   Specialty: Infectious Diseases Why: Appointment At 11 AM on Monday, 12/15/2021 Contact information: [address] [place] [address] [phone]                 Hospital Course by problem list: [name] is a 51 y.o. with a past medical history of HIV, CHF (EF 45 to 50%), type 2 diabetes, DVT/PE, treated pulmonary TB, PJP, emphysema, chronic bronchiectasis presenting with 1 day history of chills, cough and myalgias admitted for Listeria bacteremia.     #Listeria bacteremia -Initial blood cultures drawn on 11/15/2021 showing evidence of Listeria and staph epi (likely contamination) -Likely etiology for chills, malaise, and myalgias -Initially patient was given a dose of ceftriaxone in the emergency department as well as azithromycin -On admission, patient was transitioned to cefepime and azithromycin -Given positive blood cultures, patient was started on ampicillin, and stopped on cefepime and azithromycin -Currently no concerns of chest pain or shortness of breath -Patient continues to remain afebrile, with stable white count -ID following who recommended ampicillin until repeat blood  cultures -TTE negative for any vegetations -Patient was on ampicillin during hospitalization, and patient will be transition to Bactrim double strength twice daily on discharge for the next 2 weeks as repeat blood cultures were negative    #New axillary lymphadenopathy -Initial CT on 11/15/2021 showing bilateral axillary lymph nodes thought to be reactive as well as small loculated fluid versus soft tissue density next to left pectoralis musculature high in the axilla -Unsure if this is fluid collection or abscess -Results of left axilla ultrasound showing fluid collection in left axilla, ID requesting LDH during admission, and if elevated would require lymph node dissection for TB rule out -LDH was negative -Continue to follow-up outpatient with infectious disease   #Chronic multifocal bronchiectasis #Emphysema -Patient at baseline has bronchiectasis and emphysema -This has not been exacerbation during hospitalization -Patient was continued on home meds during hospitalization -Patient will be discharged on his home meds   #HIV  #History PJP pneumonia  #History of pulmonary tuberculosis, treated -11/16/2021 strep pneumo urine antigen negative -Legionella antigen pending -08/25/2021 CD4 T cells 104 and HIV viral load at 40 on Symtuza  and Tivicay -Patient on Bactrim at home for PCP pneumonia prophylaxis -Patient does endorse a cough, and patient was initially started on azithromycin and cefepime on admission for CAP coverage. -11/15/2021 chest x-ray and 11/15/2021 CT showed possible infectious process. -Cefepime and azithromycin were discontinued on 11/16/2021. -ID following stating that Bactrim cover any lung infections -Continue PCP prophylaxis with Bactrim -Continue Symtuza and Tivicay  -11/15/2021 HIV viral load at 43,100.  CD4 count 95 -Patient to continue prophylaxis with Bactrim outpatient, Symtuza and TVA outpatient   #AKI likely prerenal, resolved -Initial creatinine on  11/15/2021 1.61 which is elevated from baseline of 1.15. -Patient was given fluids, and creatinine stabilized. -Initially, it showed that the patient had a history of CKD, but upon further chart review it seems that his GFR only drops during acute kidney injuries, and bounces back afterwards.  Per my chart review, I do not think this patient has chronic kidney disease going back to 2022 -Given patient will be discharged on Bactrim, monitor BMP outpatient   #Biventricular heart failure -Last TTE in August 2022 showing mildly reduced left ejection fraction of 45 to 50% with global hypokinesis.  Right ventricular showing moderately reduced function with global hypokinesis -Patient does have some crackles to his left base likely secondary to bronchiectasis -Patient does not seem hypervolemic -Patient showing no respiratory distress during admission  #History of DVT/PE -Most recent PE in 2022 -No concern for DVTs during admission   #Dermatitis -History of itching to arms, face, and back likely secondary to atopic dermatitis -Patient denies any itching at this time -Patient is on triamcinolone cream twice daily as needed for itching -As needed hydroxyzine 10 mg 3 times daily for itching   #Type 2 diabetes, diet controlled -Patient's A1c today resulted at 6.3 -Blood sugars have been ranging elevated in the 200s -Patient was managed with sliding scale insulin -Patient to be discharged with no medication for diabetes   #Vitamin D deficiency -Vitamin D level 24.25 initially on admission on 11/15/2021 -Continue vitamin D 1000 IU daily   #Hyperlipidemia -Continue home Crestor 20 mg daily     Discharge Subjective:  Spoke with patient with interpreter on iPad.  Pharmacist was in the room prior to my exam going over medications.  Patient states he is feeling well.  He denies any chest pain, shortness of breath, fever, chills, rigors, or abdominal pain.  Patient states that he is ready to go  home.  He reports that he is feeling better.  He denies any concerns at this point.  Discharge Exam:   BP 101/65 (BP Location: Right Arm)   Pulse 89   Temp 98 F (36.7 C) (Oral)   Resp 18   Ht 5\' 11"  (1.803 m)   Wt 76.5 kg   SpO2 90%   BMI 23.52 kg/m  Constitutional: well-appearing and resting comfortably on sofa upon my exam HENT: normocephalic atraumatic, mucous membranes moist Eyes: conjunctiva non-erythematous Neck: supple Cardiovascular: regular rate and rhythm, no m/r/g Pulmonary/Chest: Crackles noted to bilateral lung bases.  Normal work of breathing. 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 MSK: normal bulk and tone Neurological: 5/5 strength in bilateral upper and lower extremities, normal gait Skin: warm and dry Psych: Normal mood and affect  Pertinent Labs, Studies, and Procedures:     Latest Ref Rng & Units 11/18/2021    2:43 AM 11/17/2021   10:37 PM 11/17/2021    2:47 AM  CBC  WBC 4.0 - 10.5 K/uL  5.4  6.1  7.4   Hemoglobin 13.0 - 17.0 g/dL 41.6  60.6  30.1   Hematocrit 39.0 - 52.0 % 37.3  36.8  34.0   Platelets 150 - 400 K/uL 182  186  146        Latest Ref Rng & Units 11/18/2021    2:43 AM 11/17/2021   10:37 PM 11/17/2021    2:47 AM  CMP  Glucose 70 - 99 mg/dL 601  093  235   BUN 6 - 20 mg/dL 26  29  29    Creatinine 0.61 - 1.24 mg/dL  5.73  2.20   Sodium 135 - 145 mmol/L 137  138  139   Potassium 3.5 - 5.1 mmol/L 3.9  4.1  3.8   Chloride 98 - 111 mmol/L 105  105  109   CO2 22 - 32 mmol/L 25  22  22    Calcium 8.9 - 10.3 mg/dL 8.7  8.9  8.4     2.54 AXILLA LEFT  Result Date: 11/18/2021 CLINICAL DATA:  Small lobulated fluid/soft tissue density adjacent to the left pectoralis musculature in the high axilla identified on chest CT EXAM: [place] AXILLARY LEFT TECHNIQUE: Ultrasound examination of the left axillary soft tissues was performed in the area of clinical concern. COMPARISON:  CT 11/15/2021. FINDINGS:  There is a small lobular fluid collection measuring 0.5 x 0.4 x 0.8 cm with adjacent echogenic fat, correlating with the collection seen on recent CT adjacent to the pectoralis muscle. There is a mildly enlarged hypoechoic lesion with surrounding echogenic fat measuring 1.0 x 1.5 x 0.3 cm. IMPRESSION: Small lobular fluid collection measuring 0.5 x 0.4 x 0.8 cm and hypoechoic lesion measuring 1.0 x 1.5 x 0.3 cm, both with surrounding echogenic fat. Findings are nonspecific but suggestive of an inflammatory process, potentially infection with developing small abscess and a reactive lymph node. Correlate with exam for tenderness/fluctuance. Electronically Signed   By: [place] M.D.   On: 11/18/2021 10:47   CT Chest Wo Contrast  Result Date: 11/15/2021 CLINICAL DATA:  Respiratory illness, nondiagnostic xray EXAM: CT CHEST WITHOUT CONTRAST TECHNIQUE: Multidetector CT imaging of the chest was performed following the standard protocol without IV contrast. RADIATION DOSE REDUCTION: This exam was performed according to the departmental dose-optimization program which includes automated exposure control, adjustment of the mA and/or kV according to patient size and/or use of iterative reconstruction technique. COMPARISON:  Radiograph earlier today.  Chest CT 08/26/2021 FINDINGS: Cardiovascular: The heart is normal in size. Thoracic aorta is normal in caliber. Upper normal pulmonary artery at 3.1 cm. No pericardial effusion. Mediastinum/Nodes: Small hiatal hernia. No bulky mediastinal adenopathy. Mediastinal assessment is limited due to motion none lack of contrast. There are prominent bilateral axillary nodes, 11 mm on the right and 10 mm on the left. These are new from prior exam. Lungs/Pleura: Breathing motion artifact limits assessment. Advanced emphysema. Multifocal bronchiectasis, prominently affecting the left lower lobe where there is mucoid impaction and fluid levels throughout multiple dilated bronchi. Similar  in appearance to prior exam. Bandlike atelectasis in the lateral right lower lobe. Faint tree-in-bud opacities in the right lower lobe, new. Nodular area of scarring/architectural distortion in the left upper lobe measuring 12 x 15 mm, series 8, image 54, stable from prior exam. Adjacent clustered left upper lobe nodules which likely represent scarring, stable. No pleural effusion. No endobronchial lesion. Upper Abdomen: Decreased hepatic density suggestive of steatosis. There is faint bilateral perinephric edema, similar to prior exam. Musculoskeletal:  There are no acute or suspicious osseous abnormalities. Small amount of lobulated fluid/soft tissue density adjacent to the left pectoralis musculature in the high axilla, series 3, image 8. This is new from prior. No internal air. IMPRESSION: 1. Faint tree-in-bud opacities in the right lower lobe are new from prior exam, likely infectious or inflammatory. 2. Background scarring and emphysema. This includes unchanged nodular area of scarring/architectural distortion in the left upper lobe. 3. Similar left lower lobe bronchiectasis with mucoid impaction and air-fluid levels. Suspect persistent infection. 4. Prominent bilateral axillary nodes are new from prior exam, likely reactive. Small amount of lobulated fluid/soft tissue density adjacent to the left pectoralis musculature in the high axilla is new from prior exam. No definite associated inflammation or soft tissue gas, recommend correlation with physical exam. Emphysema (ICD10-J43.9). Electronically Signed   By: Narda Rutherford M.D.   On: 11/15/2021 18:11   DG Chest 2 View  Result Date: 11/15/2021 CLINICAL DATA:  Suspected Sepsis EXAM: CHEST - 2 VIEW COMPARISON:  Most recent radiograph and CT 08/26/2021 FINDINGS: Emphysema and left greater than right lung scarring, similar to prior exam. No radiographic evidence of acute airspace disease. The heart is stable in size, unchanged mediastinal contours. Right  hilar prominence corresponds to the pulmonary artery on prior CT. There may be a trace left pleural effusion. No pulmonary edema. No pneumothorax. IMPRESSION: 1. Possible trace left pleural effusion. 2. Emphysema and left greater than right lung scarring, similar to prior exam. No radiographic evidence of acute airspace disease. Electronically Signed   By: Narda Rutherford M.D.   On: 11/15/2021 15:17     Discharge Instructions:  Xavien Dauphinais It was a pleasure taking care of you at [facility]. You were admitted for  and treated for . We are discharging you home now that you are doing better. Please follow the following instructions.   1) Regarding your blood infection. I have given you antibiotics in the hospital. I will send you home on antibiotics. Please take this antibiotics two times per day. It is called Trimethoprim-Sulfamethoxazole. Please take it two times a day for the next two weeks.  2) Reagarding your HIV, your infection is increasing. Regarding your left underarm fluid collection, you can follow this up outpatient. I want you to continue to follow up with the infectious disease clinic. Your appointment is on 12/15/21 at 11am. Their address is:  [address] [place] [address]  The phone number to the clinic is [phone].  Please call them to make an appointment for follow up. Until you set up the appointment, please conitinue taking your symtuza and tivicay as prescribed. Continue taking your bactrim for prophylaxis too.  3) Regarding your hospital follow-up, I have set an appointment up for your primary care physician with the internal medicine clinic on 09/14 at 1:45pm with Dr. Welton Flakes. The address is:   [address] [name] [address]  The phone number to the internal medicine clinic is [phone]  4) Regarding your lungs and your chronic bronchiectasis, continue taking your inhaler at home.  You have a follow-up appointment  scheduled with a lung doctor, Dr. Cyril Mourning on 12/15/21 at 9am.  Their address is:  [address]  [place] [address]  The phone number is: [phone]  5) Continue to take all of your medications as prescribed    Signed: Modena Slater, DO 11/19/2021, 10:52 AM   Pager: [phone]

## 2021-11-18 NOTE — Discharge Instructions (Addendum)
[name] It was a pleasure taking care of you at [facility]. You were admitted for  and treated for . We are discharging you home now that you are doing better. Please follow the following instructions.  1) 2) Dr. Welton Flakes 09/14 145 3)  Take care,  Dr. Modena Slater, DO

## 2021-11-18 NOTE — Progress Notes (Signed)
HD#3 Subjective:   Summary: [name] P Fugitt is a 51 y.o. with a past medical history of HIV, CHF (EF 45 to 50%), type 2 diabetes, DVT/PE, treated pulmonary TB, PJP, emphysema, chronic bronchiectasis presenting with 1 day history of chills, cough and myalgias admitted for Listeria bacteremia.  Overnight Events: N/A  Spoke with patient with interpreter on iPad.  Patient states he is feeling well.  He denies any chest pain, shortness of breath, fever, chills, rigors, or abdominal pain.  Patient states that he is ready to go home.  He reports that he is feeling better.  He denies any concerns at this point.  Patient is resting on the sofa upon my exam.    Objective:  Vital signs in last 24 hours: Vitals:   11/17/21 2127 11/18/21 0445 11/18/21 0904 11/18/21 1654  BP: 124/81 133/87 113/75 (!) 122/93  Pulse: 72 65 84 84  Resp: 16 18 18 18   Temp: 97.9 F (36.6 C) 98.1 F (36.7 C) 98.2 F (36.8 C) 98 F (36.7 C)  TempSrc: Oral Oral    SpO2: 95% 95% 94% 92%  Weight: 76.5 kg     Height:       Supplemental O2: Room Air SpO2: 92 %   Physical Exam:  Constitutional: well-appearing and resting comfortably on sofa upon my exam HENT: normocephalic atraumatic, mucous membranes moist Eyes: conjunctiva non-erythematous Neck: supple Cardiovascular: regular rate and rhythm, no m/r/g Pulmonary/Chest: Crackles noted to bilateral lung bases.  Normal work of breathing. 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 MSK: normal bulk and tone Neurological: 5/5 strength in bilateral upper and lower extremities, normal gait Skin: warm and dry Psych: Normal mood and affect  Filed Weights   11/16/21 1400 11/17/21 2127  Weight: 79.8 kg 76.5 kg     Intake/Output Summary (Last 24 hours) at 11/18/2021 1732 Last data filed at 11/18/2021 1303 Gross per 24 hour  Intake 1000 ml  Output 5025 ml  Net -4025 ml   Net IO Since Admission: -3,682.18 mL  [11/18/21 1732]  Pertinent Labs:    Latest Ref Rng & Units 11/18/2021    2:43 AM 11/17/2021   10:37 PM 11/17/2021    2:47 AM  CBC  WBC 4.0 - 10.5 K/uL 5.4  6.1  7.4   Hemoglobin 13.0 - 17.0 g/dL 01/17/2022  50.0  93.8   Hematocrit 39.0 - 52.0 % 37.3  36.8  34.0   Platelets 150 - 400 K/uL 182  186  146        Latest Ref Rng & Units 11/18/2021    2:43 AM 11/17/2021   10:37 PM 11/17/2021    2:47 AM  CMP  Glucose 70 - 99 mg/dL 01/17/2022  993  716   BUN 6 - 20 mg/dL 26  29  29    Creatinine 0.61 - 1.24 mg/dL 967   8.93   Sodium 135 - 145 mmol/L 137  138  139   Potassium 3.5 - 5.1 mmol/L 3.9  4.1  3.8   Chloride 98 - 111 mmol/L 105  105  109   CO2 22 - 32 mmol/L 25  22  22    Calcium 8.9 - 10.3 mg/dL 8.7  8.9  8.4     Imaging: No results found.  Assessment/Plan:   Principal Problem:   Bacteremia Active Problems:   HIV disease ([name])   AIDS (acquired immune deficiency syndrome) ([name])   Pneumonia   Pulmonary emphysema ([name])  Bronchiectasis without acute exacerbation ([name])   Listeriosis   Patient Summary:  ARIES TOWNLEY is a 51 y.o. with a past medical history of HIV, CHF (EF 45 to 50%), type 2 diabetes, DVT/PE, treated pulmonary TB, PJP, emphysema, chronic bronchiectasis presenting with 1 day history of chills, cough and myalgias admitted for Listeria bacteremia.   #Listeria bacteremia -Initial blood cultures drawn on 11/15/2021 showing evidence of Listeria and staph epi (likely contamination) -Likely etiology for chills, malaise, and myalgias -Initially patient was given a dose of ceftriaxone in the emergency department as well as azithromycin -On admission, patient was transitioned to cefepime and azithromycin -Given positive blood cultures, patient was started on ampicillin, and stopped on cefepime and azithromycin -Currently no concerns of chest pain or shortness of breath -Patient continues to remain afebrile, with stable white count -ID following who recommended ampicillin  until repeat blood cultures -TTE negative for any vegetations -Repeat blood cultures negative at 24 hours -Continue ampicillin and plan to discharge tomorrow on bactrim double strength twice daily on discharge for the next 2 weeks as repeat blood cultures are negative to date   #New axillary lymphadenopathy -Initial CT on 11/15/2021 showing bilateral axillary lymph nodes thought to be reactive as well as small loculated fluid versus soft tissue density next to left pectoralis musculature high in the axilla -Unsure if this is fluid collection or abscess -Results of left axilla ultrasound showing fluid collection in left axilla, ID requesting LDH during admission, and if elevated would require lymph node dissection for TB rule out -LDH was negative -Continue to follow-up outpatient with infectious disease   #Chronic multifocal bronchiectasis #Emphysema -Patient at baseline has bronchiectasis and emphysema -This has not been exacerbation during hospitalization -Patient was continued on home meds during hospitalization   #HIV  #History PJP pneumonia  #History of pulmonary tuberculosis, treated -11/16/2021 strep pneumo urine antigen negative -Legionella antigen pending -08/25/2021 CD4 T cells 104 and HIV viral load at 40 on Symtuza and Tivicay -Patient on Bactrim at home for PCP pneumonia prophylaxis -Patient does endorse a cough, and patient was initially started on azithromycin and cefepime on admission for CAP coverage. -11/15/2021 chest x-ray and 11/15/2021 CT showed possible infectious process. -Cefepime and azithromycin were discontinued on 11/16/2021. -ID following stating that Bactrim cover any lung infections -Continue PCP prophylaxis with Bactrim -Continue Symtuza and Tivicay  -11/15/2021 HIV viral load at 43,100.  CD4 count 95 -Continue PCP Po pneumonia prophylaxis with Bactrim -Plan for outpatient follow-up with ID regarding new viral load increase   #AKI likely prerenal,  resolved -Initial creatinine on 11/15/2021 1.61 which is elevated from baseline of 1.15. -Patient was given fluids, and creatinine stabilized. -Initially, it showed that the patient had a history of CKD, but upon further chart review it seems that his GFR only drops during acute kidney injuries, and bounces back afterwards.  Per my chart review, I do not think this patient has chronic kidney disease going back to 2022 -Given patient will be discharged on Bactrim, monitor BMP outpatient   #Biventricular heart failure -Last TTE in August 2022 showing mildly reduced left ejection fraction of 45 to 50% with global hypokinesis.  Right ventricular showing moderately reduced function with global hypokinesis -Patient does have some crackles to his left base likely secondary to bronchiectasis -Patient does not seem hypervolemic -Patient showing no respiratory distress during admission  #History of DVT/PE -Most recent PE in 2022 -No concern for DVTs during admission   #Dermatitis -History of itching to arms, face,  and back likely secondary to atopic dermatitis -Patient denies any itching at this time -Patient is on triamcinolone cream twice daily as needed for itching -As needed hydroxyzine 10 mg 3 times daily for itching   #Type 2 diabetes, diet controlled -Patient's A1c today resulted at 6.3 -Blood sugars have been ranging elevated in the 200s -Patient was managed with sliding scale insulin -Patient to be discharged with no medication for diabetes   #Vitamin D deficiency -Vitamin D level 24.25 initially on admission on 11/15/2021 -Continue vitamin D 1000 IU daily   #Hyperlipidemia -Continue home Crestor 20 mg daily  Dispo: Anticipated discharge to Home in 1 days pending [facility] pharmacy meds.   Modena Slater DO Internal Medicine Resident PGY-1 [phone] Please contact the on call pager after 5 pm and on weekends at [phone].

## 2021-11-18 NOTE — TOC Initial Note (Signed)
Transition of Care [facility]) - Initial/Assessment Note    Patient Details  Name: [name] MRN: [id number] Date of Birth: [birth year]  Transition of Care [facility]) CM/SW Contact:    Tom-Johnson, Hershal Coria, RN Phone Number: 11/18/2021, 2:24 PM  Clinical Narrative:                  CM spoke with patient at bedside about needs for post hospital transition. Admitted for Bacteremia, on IV abx. Has significant hx of DVT, PE, HIV. On Eliquis. From home alone. Patient is originally from [place], [place]. Speaks [name]. States he has two adult children and they live in [place]. Parents and other siblings live in [place].  Currently employed at [employer]. Does not drive, uses public transportation. Requesting a cab at discharge.  Has a LUQ Colostomy, could not tell where he gets his supplies from. States he goes there once a month to get his supplies.  PCP is  Marolyn Haller, MD and uses [facility] on [address]. States he is compliant with his medication regimen and does not need assistance to purchase medication.  No TOC needs or recommendations noted at this time. CM will continue to follow as patient progresses with care.    Expected Discharge Plan: Home/Self Care Barriers to Discharge: Continued Medical Work up   Patient Goals and CMS Choice Patient states their goals for this hospitalization and ongoing recovery are:: To return home CMS Medicare.gov Compare Post Acute Care list provided to:: Patient Choice offered to / list presented to : Patient  Expected Discharge Plan and Services Expected Discharge Plan: Home/Self Care   Discharge Planning Services: CM Consult Post Acute Care Choice: Home Health Living arrangements for the past 2 months: Apartment                 DME Arranged: N/A DME Agency: NA       HH Arranged: NA HH Agency: NA        Prior Living Arrangements/Services Living arrangements for the past 2 months:  Apartment Lives with:: Self Patient language and need for interpreter reviewed:: Yes Do you feel safe going back to the place where you live?: Yes      Need for Family Participation in Patient Care: Yes (Comment) Care giver support system in place?: Yes (comment)   Criminal Activity/Legal Involvement Pertinent to Current Situation/Hospitalization: No - Comment as needed  Activities of Daily Living Home Assistive Devices/Equipment: None ADL Screening (condition at time of admission) Patient's cognitive ability adequate to safely complete daily activities?: Yes Is the patient deaf or have difficulty hearing?: No Does the patient have difficulty seeing, even when wearing glasses/contacts?: No Does the patient have difficulty concentrating, remembering, or making decisions?: No Patient able to express need for assistance with ADLs?: No Does the patient have difficulty dressing or bathing?: No Independently performs ADLs?: Yes (appropriate for developmental age) Does the patient have difficulty walking or climbing stairs?: No Weakness of Legs: None Weakness of Arms/Hands: None  Permission Sought/Granted Permission sought to share information with : Case Manager, Family Supports, Oceanographer granted to share information with : Yes, Verbal Permission Granted              Emotional Assessment Appearance:: Appears stated age Attitude/Demeanor/Rapport: Engaged, Gracious Affect (typically observed): Accepting, Appropriate, Calm, Hopeful, Pleasant Orientation: : Oriented to Self, Oriented to Place, Oriented to  Time, Oriented to Situation Alcohol / Substance Use: Not Applicable Psych Involvement: No (comment)  Admission diagnosis:  COPD exacerbation ([name]) [J44.1] Flu-like symptoms [R68.89] AKI (acute kidney injury) ([name]) [N17.9] Sepsis ([name]) [A41.9] Acute cough [R05.1] Patient Active Problem List   Diagnosis Date Noted   Bacteremia 11/17/2021    Listeriosis    Sepsis ([name]) 11/15/2021   Atopic dermatitis 09/30/2021   Allergy 09/30/2021   Steroid-induced hyperglycemia 09/05/2021   COPD exacerbation ([name]) 08/23/2021   Bronchiectasis without acute exacerbation ([name]) 04/22/2021   Pruritic dermatitis 04/03/2021   S/P colostomy ([name]) 02/06/2021   History of tuberculosis 11/01/2020   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   CKD (chronic kidney disease) stage 2, GFR 60-89 ml/min 11/01/2020   Type 2 diabetes mellitus ([name]) 05/17/2019   Genital warts 05/06/2018   Pulmonary emphysema ([name])    Condyloma 01/04/2017   Chronic systolic CHF (congestive heart failure) ([name]) 04/24/2016   Normochromic normocytic anemia 02/25/2016   Pneumonia 02/25/2016   AIDS (acquired immune deficiency syndrome) ([name])    S/P ORIF (open reduction internal fixation) fracture 03/21/2014   HIV disease ([name]) 09/18/2013   PCP:  Marolyn Haller, MD Pharmacy:   [facility] [phone] - [place], [place] - [address] AT [facility] [address] [facility] [address] [place] [address] Phone: [phone] Fax: [phone]  [facility] [address] [place] [address] Phone: [phone] Fax: [phone]  [facility] - [place], [place] - [address] [place] [address] Phone: [phone] Fax: [phone]  [id number] [place], [place] - [address] AT [facility] OF [address] & [address] [place] [address] Phone: [phone] Fax: [phone]     Social Determinants of Health (SDOH) Interventions    Readmission Risk Interventions     No data to display

## 2021-11-18 NOTE — Progress Notes (Signed)
Subjective: No new complaints   Antibiotics:  Anti-infectives (From admission, onward)    Start     Dose/Rate Route Frequency Ordered Stop   11/18/21 1100  sulfamethoxazole-trimethoprim (BACTRIM DS) 800-160 MG per tablet 2 tablet        2 tablet Oral Every 12 hours 11/18/21 1006     11/16/21 2000  ampicillin (OMNIPEN) 2 g in sodium chloride 0.9 % 100 mL IVPB  Status:  Discontinued        2 g 300 mL/hr over 20 Minutes Intravenous Every 4 hours 11/16/21 1448 11/18/21 1006   11/16/21 1800  azithromycin (ZITHROMAX) tablet 500 mg  Status:  Discontinued        500 mg Oral Daily-1800 11/16/21 1014 11/16/21 1607   11/16/21 1700  azithromycin (ZITHROMAX) 500 mg in sodium chloride 0.9 % 250 mL IVPB  Status:  Discontinued        500 mg 250 mL/hr over 60 Minutes Intravenous Every 24 hours 11/15/21 2021 11/16/21 1014   11/16/21 1545  ampicillin (OMNIPEN) 2 g in sodium chloride 0.9 % 100 mL IVPB        2 g 300 mL/hr over 20 Minutes Intravenous NOW 11/16/21 1445 11/16/21 1949   11/16/21 1000  dolutegravir (TIVICAY) tablet 50 mg        50 mg Oral Daily 11/15/21 1922     11/16/21 0800  Darunavir-Cobicistat-Emtricitabine-Tenofovir Alafenamide (SYMTUZA) 800-150-200-10 MG TABS 1 tablet        1 tablet Oral Daily with breakfast 11/15/21 1922     11/16/21 0600  ceFEPIme (MAXIPIME) 2 g in sodium chloride 0.9 % 100 mL IVPB  Status:  Discontinued        2 g 200 mL/hr over 30 Minutes Intravenous Every 8 hours 11/15/21 2021 11/16/21 1607   11/15/21 2200  sulfamethoxazole-trimethoprim (BACTRIM DS) 800-160 MG per tablet 1 tablet  Status:  Discontinued        1 tablet Oral Daily 11/15/21 1922 11/18/21 1006   11/15/21 1700  cefTRIAXone (ROCEPHIN) 1 g in sodium chloride 0.9 % 100 mL IVPB        1 g 200 mL/hr over 30 Minutes Intravenous  Once 11/15/21 1656 11/16/21 1950   11/15/21 1700  azithromycin (ZITHROMAX) 500 mg in sodium chloride 0.9 % 250 mL IVPB        500 mg 250 mL/hr over 60 Minutes  Intravenous  Once 11/15/21 1656 11/16/21 1950       Medications: Scheduled Meds:  aspirin EC  81 mg Oral Daily   cholecalciferol  1,000 Units Oral Daily   Darunavir-Cobicistat-Emtricitabine-Tenofovir Alafenamide  1 tablet Oral Q breakfast   dolutegravir  50 mg Oral Daily   enoxaparin (LOVENOX) injection  40 mg Subcutaneous Q24H   insulin aspart  0-15 Units Subcutaneous TID WC   rosuvastatin  20 mg Oral Daily   sulfamethoxazole-trimethoprim  2 tablet Oral Q12H   umeclidinium-vilanterol  1 puff Inhalation Daily   Continuous Infusions: PRN Meds:.acetaminophen **OR** acetaminophen, guaiFENesin, hydrOXYzine, ipratropium-albuterol, ondansetron **OR** ondansetron (ZOFRAN) IV, senna-docusate, triamcinolone cream    Objective: Weight change: -3.333 kg  Intake/Output Summary (Last 24 hours) at 11/18/2021 1406 Last data filed at 11/18/2021 0600 Gross per 24 hour  Intake 620 ml  Output 3450 ml  Net -2830 ml   Blood pressure 113/75, pulse 84, temperature 98.2 F (36.8 C), resp. rate 18, height 5\' 11"  (1.803 m), weight 76.5 kg, SpO2 94 %. Temp:  [97.9 F (36.6 C)-98.2 F (  36.8 C)] 98.2 F (36.8 C) (09/05 0904) Pulse Rate:  [65-84] 84 (09/05 0904) Resp:  [16-18] 18 (09/05 0904) BP: (113-133)/(75-87) 113/75 (09/05 0904) SpO2:  [92 %-95 %] 94 % (09/05 0904) Weight:  [76.5 kg] 76.5 kg (09/04 2127)  Physical Exam: Physical Exam Constitutional:      Appearance: He is well-developed.  HENT:     Head: Normocephalic and atraumatic.  Eyes:     Conjunctiva/sclera: Conjunctivae normal.  Cardiovascular:     Rate and Rhythm: Normal rate and regular rhythm.  Pulmonary:     Effort: Pulmonary effort is normal. No respiratory distress.     Breath sounds: Normal breath sounds. No stridor. No wheezing.  Abdominal:     General: There is no distension.     Palpations: Abdomen is soft.  Musculoskeletal:        General: Normal range of motion.     Cervical back: Normal range of motion and neck  supple.  Skin:    General: Skin is warm and dry.     Findings: No erythema or rash.  Neurological:     General: No focal deficit present.     Mental Status: He is alert and oriented to person, place, and time.  Psychiatric:        Mood and Affect: Mood normal.        Behavior: Behavior normal.        Thought Content: Thought content normal.        Judgment: Judgment normal.      CBC:    BMET Recent Labs    11/17/21 2237 11/18/21 0243  NA 138 137  K 4.1 3.9  CL 105 105  CO2 22 25  GLUCOSE 237* 186*  BUN 29* 26*  CREATININE 1.37* 1.28*  CALCIUM 8.9 8.7*     Liver Panel  Recent Labs    11/15/21 1533  PROT 7.7  ALBUMIN 2.5*  AST 93*  ALT 47*  ALKPHOS 46  BILITOT 1.2       Sedimentation Rate No results for input(s): "ESRSEDRATE" in the last 72 hours. C-Reactive Protein No results for input(s): "CRP" in the last 72 hours.  Micro Results: Recent Results (from the past 720 hour(s))  Culture, blood (Routine x 2)     Status: Abnormal   Collection Time: 11/15/21  2:05 PM   Specimen: BLOOD RIGHT HAND  Result Value Ref Range Status   Specimen Description BLOOD RIGHT HAND  Final   Special Requests   Final    BOTTLES DRAWN AEROBIC AND ANAEROBIC Blood Culture adequate volume   Culture  Setup Time   Final    GRAM POSITIVE COCCI IN CLUSTERS IN BOTH AEROBIC AND ANAEROBIC BOTTLES GRAM POSITIVE RODS CRITICAL RESULT CALLED TO, READ BACK BY AND VERIFIED WITHAudry Pili PHARMD, AT 1401 11/16/21 D. VANHOOK    Culture (A)  Final    STAPHYLOCOCCUS EPIDERMIDIS THE SIGNIFICANCE OF ISOLATING THIS ORGANISM FROM A SINGLE SET OF BLOOD CULTURES WHEN MULTIPLE SETS ARE DRAWN IS UNCERTAIN. PLEASE NOTIFY THE MICROBIOLOGY DEPARTMENT WITHIN ONE WEEK IF SPECIATION AND SENSITIVITIES ARE REQUIRED. LISTERIA MONOCYTOGENES Recommended therapy: ampicillin with or without an aminoglycoside. Cephalosporins are not effective. HEALTH DEPARTMENT NOTIFIED Performed at [facility],  [address], [place], [place] [address]    Report Status 11/18/2021 FINAL  Final  Blood Culture ID Panel (Reflexed)     Status: Abnormal   Collection Time: 11/15/21  2:05 PM  Result Value Ref Range Status   Enterococcus  faecalis NOT DETECTED NOT DETECTED Final   Enterococcus Faecium NOT DETECTED NOT DETECTED Final   Listeria monocytogenes DETECTED (A) NOT DETECTED Final    Comment: CRITICAL RESULT CALLED TO, READ BACK BY AND VERIFIED WITHAudry Pili PHARMD, AT 1401 11/16/21 D. VANHOOK    Staphylococcus species DETECTED (A) NOT DETECTED Final    Comment: CRITICAL RESULT CALLED TO, READ BACK BY AND VERIFIED WITHAudry Pili PHARMD, AT 1401 11/16/21 D. VANHOOK    Staphylococcus aureus (BCID) NOT DETECTED NOT DETECTED Final   Staphylococcus epidermidis DETECTED (A) NOT DETECTED Final    Comment: Methicillin (oxacillin) resistant coagulase negative staphylococcus. Possible blood culture contaminant (unless isolated from more than one blood culture draw or clinical case suggests pathogenicity). No antibiotic treatment is indicated for blood  culture contaminants. CRITICAL RESULT CALLED TO, READ BACK BY AND VERIFIED WITHAudry Pili PHARMD, AT 1401 11/16/21 D. VANHOOK    Staphylococcus lugdunensis NOT DETECTED NOT DETECTED Final   Streptococcus species NOT DETECTED NOT DETECTED Final   Streptococcus agalactiae NOT DETECTED NOT DETECTED Final   Streptococcus pneumoniae NOT DETECTED NOT DETECTED Final   Streptococcus pyogenes NOT DETECTED NOT DETECTED Final   A.calcoaceticus-baumannii NOT DETECTED NOT DETECTED Final   Bacteroides fragilis NOT DETECTED NOT DETECTED Final   Enterobacterales NOT DETECTED NOT DETECTED Final   Enterobacter cloacae complex NOT DETECTED NOT DETECTED Final   Escherichia coli NOT DETECTED NOT DETECTED Final   Klebsiella aerogenes NOT DETECTED NOT DETECTED Final   Klebsiella oxytoca NOT DETECTED NOT DETECTED Final   Klebsiella pneumoniae NOT DETECTED NOT DETECTED Final    Proteus species NOT DETECTED NOT DETECTED Final   Salmonella species NOT DETECTED NOT DETECTED Final   Serratia marcescens NOT DETECTED NOT DETECTED Final   Haemophilus influenzae NOT DETECTED NOT DETECTED Final   Neisseria meningitidis NOT DETECTED NOT DETECTED Final   Pseudomonas aeruginosa NOT DETECTED NOT DETECTED Final   Stenotrophomonas maltophilia NOT DETECTED NOT DETECTED Final   Candida albicans NOT DETECTED NOT DETECTED Final   Candida auris NOT DETECTED NOT DETECTED Final   Candida glabrata NOT DETECTED NOT DETECTED Final   Candida krusei NOT DETECTED NOT DETECTED Final   Candida parapsilosis NOT DETECTED NOT DETECTED Final   Candida tropicalis NOT DETECTED NOT DETECTED Final   Cryptococcus neoformans/gattii NOT DETECTED NOT DETECTED Final   Methicillin resistance mecA/C DETECTED (A) NOT DETECTED Final    Comment: CRITICAL RESULT CALLED TO, READ BACK BY AND VERIFIED WITHAudry Pili PHARMD, AT 1401 11/16/21 D. VANHOOK Performed at [facility], [address] [place], [place] [address]   Culture, blood (Routine x 2)     Status: Abnormal   Collection Time: 11/15/21  2:10 PM   Specimen: BLOOD  Result Value Ref Range Status   Specimen Description BLOOD RIGHT ANTECUBITAL  Final   Special Requests   Final    BOTTLES DRAWN AEROBIC AND ANAEROBIC Blood Culture results may not be optimal due to an inadequate volume of blood received in culture bottles   Culture  Setup Time   Final    GRAM POSITIVE RODS AEROBIC BOTTLE ONLY CRITICAL VALUE NOTED.  VALUE IS CONSISTENT WITH PREVIOUSLY REPORTED AND CALLED VALUE.    Culture (A)  Final    LISTERIA MONOCYTOGENES Recommended therapy: ampicillin with or without an aminoglycoside. Cephalosporins are not effective. HEALTH DEPARTMENT NOTIFIED Performed at [facility], [address] [place], [place] [address]    Report Status 11/18/2021 FINAL  Final  Resp Panel by  RT-PCR (Flu A&B, Covid) Anterior Nasal Swab     Status: None    Collection Time: 11/15/21  2:47 PM   Specimen: Anterior Nasal Swab  Result Value Ref Range Status   SARS Coronavirus 2 by RT PCR NEGATIVE NEGATIVE Final    Comment: (NOTE) SARS-CoV-2 target nucleic acids are NOT DETECTED.  The SARS-CoV-2 RNA is generally detectable in upper respiratory specimens during the acute phase of infection. The lowest concentration of SARS-CoV-2 viral copies this assay can detect is 138 copies/mL. A negative result does not preclude SARS-Cov-2 infection and should not be used as the sole basis for treatment or other patient management decisions. A negative result may occur with  improper specimen collection/handling, submission of specimen other than nasopharyngeal swab, presence of viral mutation(s) within the areas targeted by this assay, and inadequate number of viral copies(<138 copies/mL). A negative result must be combined with clinical observations, patient history, and epidemiological information. The expected result is Negative.  Fact Sheet for Patients:  BloggerCourse.com  Fact Sheet for Healthcare Providers:  SeriousBroker.it  This test is no t yet approved or cleared by the [place] FDA and  has been authorized for detection and/or diagnosis of SARS-CoV-2 by FDA under an Emergency Use Authorization (EUA). This EUA will remain  in effect (meaning this test can be used) for the duration of the COVID-19 declaration under Section 564(b)(1) of the Act, 21 U.S.C.section 360bbb-3(b)(1), unless the authorization is terminated  or revoked sooner.       Influenza A by PCR NEGATIVE NEGATIVE Final   Influenza B by PCR NEGATIVE NEGATIVE Final    Comment: (NOTE) The Xpert Xpress SARS-CoV-2/FLU/RSV plus assay is intended as an aid in the diagnosis of influenza from Nasopharyngeal swab specimens and should not be used as a sole basis for treatment. Nasal washings and aspirates are unacceptable for  Xpert Xpress SARS-CoV-2/FLU/RSV testing.  Fact Sheet for Patients: BloggerCourse.com  Fact Sheet for Healthcare Providers: SeriousBroker.it  This test is not yet approved or cleared by the [place] FDA and has been authorized for detection and/or diagnosis of SARS-CoV-2 by FDA under an Emergency Use Authorization (EUA). This EUA will remain in effect (meaning this test can be used) for the duration of the COVID-19 declaration under Section 564(b)(1) of the Act, 21 U.S.C. section 360bbb-3(b)(1), unless the authorization is terminated or revoked.  Performed at [facility], [address] [place], [place] [address]   Respiratory (~20 pathogens) panel by PCR     Status: None   Collection Time: 11/15/21  2:47 PM   Specimen: Nasopharyngeal Swab; Respiratory  Result Value Ref Range Status   Adenovirus NOT DETECTED NOT DETECTED Final   Coronavirus 229E NOT DETECTED NOT DETECTED Final    Comment: (NOTE) The Coronavirus on the Respiratory Panel, DOES NOT test for the novel  Coronavirus (2019 nCoV)    Coronavirus HKU1 NOT DETECTED NOT DETECTED Final   Coronavirus NL63 NOT DETECTED NOT DETECTED Final   Coronavirus OC43 NOT DETECTED NOT DETECTED Final   Metapneumovirus NOT DETECTED NOT DETECTED Final   Rhinovirus / Enterovirus NOT DETECTED NOT DETECTED Final   Influenza A NOT DETECTED NOT DETECTED Final   Influenza B NOT DETECTED NOT DETECTED Final   Parainfluenza Virus 1 NOT DETECTED NOT DETECTED Final   Parainfluenza Virus 2 NOT DETECTED NOT DETECTED Final   Parainfluenza Virus 3 NOT DETECTED NOT DETECTED Final   Parainfluenza Virus 4 NOT DETECTED NOT DETECTED Final   Respiratory Syncytial Virus NOT  DETECTED NOT DETECTED Final   Bordetella pertussis NOT DETECTED NOT DETECTED Final   Bordetella Parapertussis NOT DETECTED NOT DETECTED Final   Chlamydophila pneumoniae NOT DETECTED NOT DETECTED Final   Mycoplasma pneumoniae  NOT DETECTED NOT DETECTED Final    Comment: Performed at [facility], [address], [place], [place] [address]  Culture, blood (Routine X 2) w Reflex to ID Panel     Status: None (Preliminary result)   Collection Time: 11/16/21  4:45 PM   Specimen: BLOOD  Result Value Ref Range Status   Specimen Description BLOOD RIGHT ANTECUBITAL  Final   Special Requests   Final    BOTTLES DRAWN AEROBIC AND ANAEROBIC Blood Culture results may not be optimal due to an inadequate volume of blood received in culture bottles   Culture   Final    NO GROWTH 2 DAYS Performed at [facility], [address], [place], [place] [address]    Report Status PENDING  Incomplete  Culture, blood (Routine X 2) w Reflex to ID Panel     Status: None (Preliminary result)   Collection Time: 11/16/21  4:46 PM   Specimen: BLOOD RIGHT HAND  Result Value Ref Range Status   Specimen Description BLOOD RIGHT HAND  Final   Special Requests   Final    BOTTLES DRAWN AEROBIC AND ANAEROBIC Blood Culture results may not be optimal due to an inadequate volume of blood received in culture bottles   Culture   Final    NO GROWTH 2 DAYS Performed at [facility], [address], [place], [place] [address]    Report Status PENDING  Incomplete    Studies/Results: [place] AXILLA LEFT  Result Date: 11/18/2021 CLINICAL DATA:  Small lobulated fluid/soft tissue density adjacent to the left pectoralis musculature in the high axilla identified on chest CT EXAM: [place] AXILLARY LEFT TECHNIQUE: Ultrasound examination of the left axillary soft tissues was performed in the area of clinical concern. COMPARISON:  CT 11/15/2021. FINDINGS: There is a small lobular fluid collection measuring 0.5 x 0.4 x 0.8 cm with adjacent echogenic fat, correlating with the collection seen on recent CT adjacent to the pectoralis muscle. There is a mildly enlarged hypoechoic lesion with surrounding echogenic fat measuring 1.0 x 1.5 x 0.3 cm. IMPRESSION: Small  lobular fluid collection measuring 0.5 x 0.4 x 0.8 cm and hypoechoic lesion measuring 1.0 x 1.5 x 0.3 cm, both with surrounding echogenic fat. Findings are nonspecific but suggestive of an inflammatory process, potentially infection with developing small abscess and a reactive lymph node. Correlate with exam for tenderness/fluctuance. Electronically Signed   By: Caprice Renshaw M.D.   On: 11/18/2021 10:47   ECHOCARDIOGRAM COMPLETE  Result Date: 11/17/2021    ECHOCARDIOGRAM REPORT   Patient Name:   SHELBY ANDERLE Date of Exam: 11/17/2021 Medical Rec #:  [id number]          Height:       66.0 in Accession #:    [id number]         Weight:       176.0 lb Date of Birth:  Jan 10, 1971           BSA:          1.894 m Patient Age:    51 years           BP:           102/82 mmHg Patient Gender: M  HR:           66 bpm. Exam Location:  Inpatient Procedure: 2D Echo, Cardiac Doppler and Color Doppler Indications:    Bacteremia R78.81  History:        Patient has prior history of Echocardiogram examinations, most                 recent 11/02/2020. Signs/Symptoms:Dyspnea; Risk Factors:Diabetes.  Sonographer:    Eulah Pont RDCS Referring Phys: [id number] TRUNG T VU IMPRESSIONS  1. Left ventricular ejection fraction, by estimation, is 50%. The left ventricle has low normal function. The left ventricle has no regional wall motion abnormalities. Left ventricular diastolic parameters were normal.  2. Right ventricular systolic function is normal. The right ventricular size is mildly enlarged. There is normal pulmonary artery systolic pressure. The estimated right ventricular systolic pressure is 22.9 mmHg.  3. The mitral valve is grossly normal. Mild to moderate mitral valve regurgitation. No evidence of mitral stenosis.  4. The aortic valve is tricuspid. Aortic valve regurgitation is not visualized. No aortic stenosis is present.  5. The inferior vena cava is normal in size with greater than 50% respiratory variability,  suggesting right atrial pressure of 3 mmHg. Conclusion(s)/Recommendation(s): No evidence of valvular vegetations on this transthoracic echocardiogram. Consider a transesophageal echocardiogram to exclude infective endocarditis if clinically indicated. FINDINGS  Left Ventricle: Left ventricular ejection fraction, by estimation, is 50%. The left ventricle has low normal function. The left ventricle has no regional wall motion abnormalities. The left ventricular internal cavity size was normal in size. There is no left ventricular hypertrophy. Left ventricular diastolic parameters were normal. Right Ventricle: The right ventricular size is mildly enlarged. No increase in right ventricular wall thickness. Right ventricular systolic function is normal. There is normal pulmonary artery systolic pressure. The tricuspid regurgitant velocity is 2.23  m/s, and with an assumed right atrial pressure of 3 mmHg, the estimated right ventricular systolic pressure is 22.9 mmHg. Left Atrium: Left atrial size was normal in size. Right Atrium: Right atrial size was normal in size. Pericardium: There is no evidence of pericardial effusion. Mitral Valve: The mitral valve is grossly normal. Mild to moderate mitral valve regurgitation. No evidence of mitral valve stenosis. Tricuspid Valve: The tricuspid valve is normal in structure. Tricuspid valve regurgitation is trivial. No evidence of tricuspid stenosis. Aortic Valve: The aortic valve is tricuspid. Aortic valve regurgitation is not visualized. No aortic stenosis is present. Pulmonic Valve: The pulmonic valve was normal in structure. Pulmonic valve regurgitation is trivial. No evidence of pulmonic stenosis. Aorta: The aortic root is normal in size and structure. Venous: The inferior vena cava is normal in size with greater than 50% respiratory variability, suggesting right atrial pressure of 3 mmHg. IAS/Shunts: No atrial level shunt detected by color flow Doppler.  LEFT VENTRICLE PLAX  2D LVIDd:         5.80 cm      Diastology LVIDs:         3.80 cm      LV e' medial:    10.30 cm/s LV PW:         1.00 cm      LV E/e' medial:  8.7 LV IVS:        0.70 cm      LV e' lateral:   13.20 cm/s LVOT diam:     2.10 cm      LV E/e' lateral: 6.8 LV SV:         69 LV  SV Index:   37 LVOT Area:     3.46 cm  LV Volumes (MOD) LV vol d, MOD A2C: 124.0 ml LV vol d, MOD A4C: 122.0 ml LV vol s, MOD A2C: 58.9 ml LV vol s, MOD A4C: 52.1 ml LV SV MOD A2C:     65.1 ml LV SV MOD A4C:     122.0 ml LV SV MOD BP:      70.3 ml RIGHT VENTRICLE RV S prime:     12.10 cm/s TAPSE (M-mode): 2.2 cm LEFT ATRIUM             Index        RIGHT ATRIUM           Index LA diam:        3.70 cm 1.95 cm/m   RA Area:     18.90 cm LA Vol (A2C):   56.1 ml 29.62 ml/m  RA Volume:   54.50 ml  28.77 ml/m LA Vol (A4C):   59.2 ml 31.26 ml/m LA Biplane Vol: 57.4 ml 30.31 ml/m  AORTIC VALVE LVOT Vmax:   99.00 cm/s LVOT Vmean:  69.700 cm/s LVOT VTI:    0.200 m  AORTA Ao Root diam: 3.30 cm Ao Asc diam:  3.40 cm MITRAL VALVE                  TRICUSPID VALVE MV Area (PHT): 4.21 cm       TR Peak grad:   19.9 mmHg MV Decel Time: 180 msec       TR Vmax:        223.00 cm/s MR Peak grad:    100.0 mmHg MR Mean grad:    73.0 mmHg    SHUNTS MR Vmax:         500.00 cm/s  Systemic VTI:  0.20 m MR Vmean:        408.0 cm/s   Systemic Diam: 2.10 cm MR PISA:         1.01 cm MR PISA Eff ROA: 8 mm MR PISA Radius:  0.40 cm MV E velocity: 89.20 cm/s MV A velocity: 75.70 cm/s MV E/A ratio:  1.18 Weston Brass MD Electronically signed by Weston Brass MD Signature Date/Time: 11/17/2021/11:47:46 AM    Final       Assessment/Plan:  INTERVAL HISTORY:  pt with bilateral axillary lymphadenopathy seen on CT scan 1 Suggestion fluid collection?   Principal Problem:   Bacteremia Active Problems:   HIV disease ([name])   AIDS (acquired immune deficiency syndrome) ([name])   Pneumonia   Pulmonary emphysema ([name])   Bronchiectasis without acute exacerbation ([name])    Listeriosis    [name] is a 51 y.o. male with HIV/AIDS, intermittnt adherence, it appears in part due to the fact that he has insurance [place] for Symbicort with the [facility] but does not call him using a [name] interpreter now admitted with Pseudomonas attaching his bacteremia.    #1 Listeria bacteremia: He did have headaches when he arrived.  Now dissipated.  He has been receiving high-dose ampicillin switch over to Bactrim.  I will make sure he gets at least 3 weeks of Bactrim.  Dose of 2 double strength tablets twice daily.  He has had a repeat BMP to ensure he is not developing hyperkalemia pseudohyperkalemia elevation in creatinine  #2 HIV AIDS: Based 0 the hospital this TIVICAY and SYMTUZA along with Bactrim.  Past Rx function staff to see if we can possibly  work from having his meds filled.  Was a long specialty pharmacy or the medications delivered to our clinic have a better control over and actually getting his medications regularly.  3.  Axillary lymph nodes one with fluid noted:?  NOt sure the significance of these ? Related to up and downs of taking then not taking ARV  Would check LDH. Certainly if they enlarge further despite ARV or LDH elevated he would need an axillary lymph node dissection with tissue sent to pathology in separate tissue sent to the microbiology lab saline for AFB culture fungal culture  I spent 51  minutes with the patient including than 50% of the time in face to face counseling of the patient re his listeria, HIV, AIDS<  personally reviewing CT chest along with review of medical records in preparation for the visit and during the visit and in coordination of his care with IMTS.      LOS: 3 days   Acey LavCornelius Van Dam 11/18/2021, 2:06 PM

## 2021-11-19 ENCOUNTER — Other Ambulatory Visit (HOSPITAL_COMMUNITY): Payer: Self-pay

## 2021-11-19 DIAGNOSIS — R7881 Bacteremia: Secondary | ICD-10-CM | POA: Diagnosis not present

## 2021-11-19 DIAGNOSIS — A3289 Other forms of listeriosis: Secondary | ICD-10-CM

## 2021-11-19 DIAGNOSIS — B20 Human immunodeficiency virus [HIV] disease: Secondary | ICD-10-CM | POA: Diagnosis not present

## 2021-11-19 LAB — LEGIONELLA PNEUMOPHILA SEROGP 1 UR AG: L. pneumophila Serogp 1 Ur Ag: NEGATIVE

## 2021-11-19 LAB — GLUCOSE, CAPILLARY
Glucose-Capillary: 114 mg/dL — ABNORMAL HIGH (ref 70–99)
Glucose-Capillary: 144 mg/dL — ABNORMAL HIGH (ref 70–99)

## 2021-11-19 MED ORDER — TRIAMCINOLONE ACETONIDE 0.1 % EX CREA
TOPICAL_CREAM | Freq: Two times a day (BID) | CUTANEOUS | 0 refills | Status: DC | PRN
  Filled 2021-11-19: qty 30, 30d supply, fill #0

## 2021-11-19 MED ORDER — GUAIFENESIN 200 MG/10ML PO LIQD
5.0000 mL | ORAL | 0 refills | Status: DC | PRN
  Filled 2021-11-19: qty 118, 4d supply, fill #0

## 2021-11-19 NOTE — TOC Transition Note (Signed)
Transition of Care [facility]) - CM/SW Discharge Note   Patient Details  Name: [name] MRN: [id number] Date of Birth: March 01, 1971  Transition of Care [facility]) CM/SW Contact:  Tom-Johnson, Hershal Coria, RN Phone Number: 11/19/2021, 12:07 PM   Clinical Narrative:     Patient is scheduled for discharge today. Hosp f/u on AVS. Prescription meds sent to [facility] pharmacy and delivered to patient at bedside. Cab voucher given to patient's RN for transportation. No further TOC need noted.   Final next level of care: Home/Self Care Barriers to Discharge: Barriers Resolved   Patient Goals and CMS Choice Patient states their goals for this hospitalization and ongoing recovery are:: To return home CMS Medicare.gov Compare Post Acute Care list provided to:: Patient Choice offered to / list presented to : Patient  Discharge Placement                Patient to be transferred to facility by: [facility]      Discharge Plan and Services   Discharge Planning Services: CM Consult Post Acute Care Choice: Home Health          DME Arranged: N/A DME Agency: NA       HH Arranged: NA HH Agency: NA        Social Determinants of Health (SDOH) Interventions     Readmission Risk Interventions     No data to display

## 2021-11-19 NOTE — Progress Notes (Addendum)
Discharge instructions reviewed with pt using interpreter services speaking [name].  Copy of instructions given to pt, scripts filled by [facility] pharmacy and reviewed with pt.pt verbalized understanding of instructions and meds and when to take.   Pt d/c'd via wheelchair with belongings, cab voucher provided by [facility] , cab called and will pick pt up at main entrance.            Escorted by staff.

## 2021-11-19 NOTE — Plan of Care (Signed)
  Problem: Coping: Goal: Ability to adjust to condition or change in health will improve Outcome: Progressing   Problem: Metabolic: Goal: Ability to maintain appropriate glucose levels will improve Outcome: Progressing   Problem: Clinical Measurements: Goal: Will remain free from infection Outcome: Progressing   Problem: Clinical Measurements: Goal: Diagnostic test results will improve Outcome: Progressing

## 2021-11-21 LAB — CULTURE, BLOOD (ROUTINE X 2)
Culture: NO GROWTH
Culture: NO GROWTH

## 2021-11-27 ENCOUNTER — Encounter: Payer: Self-pay | Admitting: Student

## 2021-11-27 ENCOUNTER — Encounter: Payer: BC Managed Care – PPO | Admitting: Internal Medicine

## 2021-11-27 NOTE — Progress Notes (Deleted)
CC: Hospital Follow up  HPI:  [name] is a 51 y.o. 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   Please see problem-based list for further details, assessments, and plans.  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  Current Outpatient Medications (Respiratory):    albuterol (VENTOLIN HFA) 108 (90 Base) MCG/ACT inhaler, Inhale 2 puffs into the lungs every 6 (six) hours as needed for wheezing or shortness of breath.   cetirizine (ZYRTEC) 5 MG tablet, Take 1 tablet (5 mg total) by mouth daily.   guaiFENesin 200 MG/10ML LIQD, Take 5 mLs by mouth every 4 (four) hours as needed for cough or to loosen phlegm.   umeclidinium-vilanterol (ANORO ELLIPTA) 62.5-25 MCG/ACT AEPB, Inhale 1 puff into the lungs daily.  Current Outpatient Medications (Analgesics):    aspirin EC 81 MG tablet, Take 1 tablet (81 mg total) by mouth  daily. Swallow whole. (Patient not taking: Reported on 11/15/2021)   ibuprofen (ADVIL) 200 MG tablet, Take 400 mg by mouth every 6 (six) hours as needed for mild pain, fever or headache. (Patient not taking: Reported on 11/15/2021)   Current Outpatient Medications (Other):    vitamin D3 (CHOLECALCIFEROL) 25 MCG tablet, Take 1 tablet (1,000 Units total) by mouth daily.   Darunavir-Cobicistat-Emtricitabine-Tenofovir Alafenamide (SYMTUZA) 800-150-200-10 MG TABS, TAKE 1 TABLET BY MOUTH 1 TIME A DAY WITH BREAKFAST   dolutegravir (TIVICAY) 50 MG tablet, TAKE ONE TABLET BY MOUTH ONCE DAILY. STORE AT ROOM TEMPERATURE   sulfamethoxazole-trimethoprim (BACTRIM DS) 800-160 MG tablet, Take 2 tablets by mouth every 12 (twelve) hours for 14 days.   triamcinolone cream (KENALOG) 0.1 %, Apply topically 2 (two) times daily as needed (itching).  Review of Systems:  Review of system negative unless stated in the problem list or HPI.    Physical Exam:  There were no vitals filed for this visit.  Physical Exam General: NAD HENT: NCAT Lungs: CTAB, no wheeze, rhonchi or rales.  Cardiovascular: Normal heart sounds, no r/m/g, 2+ pulses in all extremities. No LE edema Abdomen: No TTP, normal bowel sounds MSK: No asymmetry or muscle atrophy.  Skin: no lesions noted on exposed skin Neuro: Alert and oriented x4. CN grossly intact Psych: Normal mood and normal affect   Assessment & Plan:   No problem-specific Assessment & Plan notes found for this encounter.   See Encounters Tab for problem based charting.  Patient discussed with Dr. {NAMES:[id number]::"Guilloud","Hoffman","Mullen","Narendra","Vincent","Machen","Lau","Hatcher"} Gwenevere Abbot, MD [name]. [facility] Internal Medicine Residency, PGY-2    1.  Follow-up: Listeria bacteremia: Patient found to have positive blood culture with Listeria admission.  Patient received 3 days of treatment ampicillin and was discontinued after repeat blood  cultures negative.  Patient will be discharged on Bactrim 800 mg twice daily for the next 2 weeks.  Consider follow-up BMP to monitor kidney function and potassium as Bactrim has been started. Order BMP   ID follow up:           Left axillary lymphadenopathy: Patient had a small fluid collection found on the left axilla on ultrasound.  ID following, recommended LDH for concern of TB.  LDH negative.  Follow-up outpatient.   HIV: Follow-up on admission elevated at 43,000 and CD4 count 94.  Patient still on Symtuza and Tivicay.  Follow-up with infectious disease outpatient for optimization of antiretroviral treatment.  Pulmonary:  Chronic bronchiectasis/emphysema: Patient has follow-up appointment with Dr. Vassie Loll of pulmonology for further outpatient management.  Patient to continue at home Anoro Ellipta 1 puff daily and albuterol 2 puffs every 6 as needed.  Follow-up on wheezing as well as respiratory status.   2.  Labs / imaging needed at time of follow-up: BMP as Bactrim was started 3.  Pending labs/ test needing follow-up: N/A 4.  Medication Changes             1) Patient started on Bactrim 800 mg twice daily for the next 2 weeks

## 2021-12-01 ENCOUNTER — Other Ambulatory Visit: Payer: Self-pay

## 2021-12-01 ENCOUNTER — Other Ambulatory Visit: Payer: BC Managed Care – PPO

## 2021-12-01 DIAGNOSIS — B20 Human immunodeficiency virus [HIV] disease: Secondary | ICD-10-CM

## 2021-12-02 LAB — T-HELPER CELLS (CD4) COUNT (NOT AT ARMC)
CD4 % Helper T Cell: 6 % — ABNORMAL LOW (ref 33–65)
CD4 T Cell Abs: 60 /uL — ABNORMAL LOW (ref 400–1790)

## 2021-12-04 LAB — HIV-1 RNA QUANT-NO REFLEX-BLD
HIV 1 RNA Quant: 268 Copies/mL — ABNORMAL HIGH
HIV-1 RNA Quant, Log: 2.43 Log cps/mL — ABNORMAL HIGH

## 2021-12-07 DIAGNOSIS — R599 Enlarged lymph nodes, unspecified: Secondary | ICD-10-CM | POA: Insufficient documentation

## 2021-12-07 DIAGNOSIS — I5043 Acute on chronic combined systolic (congestive) and diastolic (congestive) heart failure: Secondary | ICD-10-CM | POA: Insufficient documentation

## 2021-12-07 NOTE — Progress Notes (Signed)
Cardiology Office Note   Date:  12/08/2021   ID:  DAMAREE SARGENT, DOB 1970-05-03, MRN [id number]  PCP:  Rick Duff, MD  Cardiologist:   Fransico Him, MD Referring:  Sid Falcon, MD  Chief Complaint  Patient presents with   Cardiomyopathy      History of Present Illness: [name] is a 51 y.o. male who presents for hospital follow-up.  He is new to me.  He was recently in the hospital with Listeria bacteremia.  He has HIV.  Marland KitchenTTE was negative for any vegetations.    The EF was 50%.   There were no valvular abnormalities.   I do see that in 2021 he had a TEE by our service.  There is no evidence of endocarditis.  His EF was 55 to 60%.  There was some mild mitral regurgitation.  He has not otherwise had any cardiac history that I can find.  I do see that he has had hospitalization with respiratory problems frequently with bronchiectasis and advanced emphysema.  He has had pneumonias.  I spoke to him through an interpreter.  He actually denies any cardiovascular symptoms.  He said since his hospitalization has had no fevers or chills.  He has had no palpitations, presyncope or syncope.  He does not have any shortness of breath, PND or orthopnea.  He has no chest pressure, neck or arm discomfort.  He has had no weight gain.  He denies any new lower extremity edema.  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  This was in 2020.  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[place])    TB (pulmonary tuberculosis)    previously treated according to refugee documentation    Past Surgical History:  Procedure Laterality Date   BRONCHIAL WASHINGS Bilateral 04/23/2021   Procedure: BRONCHIAL WASHINGS;  Surgeon: Candee Furbish, MD;  Location: [facility] ENDOSCOPY;  Service: Pulmonary;  Laterality: Bilateral;   FRACTURE SURGERY     IM NAILING TIBIA Right 03/21/2014   ORIF ANKLE FRACTURE Right 03/21/2014   lateral malleolus/notes 03/21/2014   ORIF ANKLE FRACTURE Right 03/21/2014   Procedure: OPEN REDUCTION INTERNAL FIXATION (ORIF) pilon ;  Surgeon: Marybelle Killings, MD;  Location: [place];  Service: Orthopedics;  Laterality: Right;   TEE WITHOUT CARDIOVERSION N/A 05/23/2019   Procedure: TRANSESOPHAGEAL ECHOCARDIOGRAM (TEE);  Surgeon: Skeet Latch, MD;  Location: [place];  Service: Cardiovascular;  Laterality: N/A;   TIBIA IM NAIL INSERTION Right 03/21/2014   Procedure: INTRAMEDULLARY (IM) NAIL TIBIAL;  Surgeon: Marybelle Killings, MD;  Location: [place];  Service: Orthopedics;  Laterality: Right;   VIDEO BRONCHOSCOPY Bilateral 03/02/2016   Procedure: VIDEO BRONCHOSCOPY WITHOUT FLUORO;  Surgeon: Javier Glazier, MD;  Location: [facility] ENDOSCOPY;  Service: Cardiopulmonary;  Laterality: Bilateral;   VIDEO BRONCHOSCOPY Left 04/23/2021   Procedure: VIDEO BRONCHOSCOPY WITHOUT FLUORO;  Surgeon: Lorin Glass, MD;  Location: [facility] ENDOSCOPY;  Service: Pulmonary;  Laterality: Left;     Current Outpatient Medications  Medication Sig Dispense Refill   albuterol (VENTOLIN HFA) 108 (90 Base) MCG/ACT inhaler Inhale 2 puffs into the lungs every 6 (six) hours as needed for wheezing or shortness of breath. 18 g 2   aspirin EC 81 MG tablet Take 1 tablet (81 mg total) by mouth daily. Swallow whole. 150  tablet 2   Darunavir-Cobicistat-Emtricitabine-Tenofovir Alafenamide (SYMTUZA) 800-150-200-10 MG TABS TAKE 1 TABLET BY MOUTH 1 TIME A DAY WITH BREAKFAST 30 tablet 5   dolutegravir (TIVICAY) 50 MG tablet TAKE ONE TABLET BY MOUTH ONCE DAILY. STORE AT ROOM TEMPERATURE 30 tablet 5   guaiFENesin 200 MG/10ML LIQD Take 5 mLs by mouth every 4 (four) hours as needed for cough or to loosen phlegm. 118 mL 0   ibuprofen (ADVIL) 200 MG tablet Take 400 mg by mouth every 6 (six) hours as needed for mild pain, fever or headache.     rosuvastatin (CRESTOR) 20 MG tablet Take 1 tablet (20 mg total) by mouth daily. 30 tablet 11   triamcinolone cream (KENALOG) 0.1 % Apply topically 2 (two) times daily as needed (itching). 30 g 0   umeclidinium-vilanterol (ANORO ELLIPTA) 62.5-25 MCG/ACT AEPB Inhale 1 puff into the lungs daily. 60 each 2   vitamin D3 (CHOLECALCIFEROL) 25 MCG tablet Take 1 tablet (1,000 Units total) by mouth daily. 30 tablet 2   cetirizine (ZYRTEC) 5 MG tablet Take 1 tablet (5 mg total) by mouth daily. 30 tablet 0   No current facility-administered medications for this visit.    Allergies:   Metformin and related    Social History:  The patient  reports that he quit smoking about 20 years ago. His smoking use included cigarettes. He has a 3.00 pack-year smoking history. He has never used smokeless tobacco. He reports that he does not currently use alcohol. He reports that he does not use drugs.  He is originally from the [place].  Family History:  The patient's family history includes Heart disease in his sister; Hypertension in an other family member.    ROS:  Please see the history of present illness.   Otherwise, review of systems are positive for none.   All other systems are reviewed and negative.    PHYSICAL EXAM: VS:  BP 128/74   Pulse 82   Ht 5\' 8"  (1.727 m)   Wt 170 lb 3.2 oz (77.2 kg)   SpO2 98%   BMI 25.88 kg/m  , BMI Body mass index is 25.88 kg/m. GENERAL:  Well appearing HEENT:   Pupils equal round and reactive, fundi not visualized, oral mucosa unremarkable NECK:  No jugular venous distention, waveform within normal limits, carotid upstroke brisk and symmetric, no bruits, no thyromegaly LYMPHATICS:  No cervical, inguinal adenopathy LUNGS:  Clear to auscultation bilaterally BACK:  No CVA tenderness CHEST:  Unremarkable HEART:  PMI not displaced or sustained,S1 and S2 within normal limits, no S3, no S4, no clicks, no rubs, no murmurs ABD:  Flat, positive bowel sounds normal in frequency in pitch, no bruits, no rebound, no guarding, no midline pulsatile mass, no hepatomegaly, no splenomegaly, right lower quadrant colostomy bag EXT:  2 plus pulses throughout, no edema, no cyanosis no clubbing SKIN:  No rashes no nodules NEURO:  Cranial nerves  II through XII grossly intact, motor grossly intact throughout [facility]:  Cognitively intact, oriented to person place and time    EKG:  EKG is not ordered today. The ekg ordered 11/15/2021 demonstrates sinus tachycardia, rate 124, left axis deviation, QTc slightly prolonged, no acute ST-T wave changes.   Recent Labs: 08/26/2021: B Natriuretic Peptide 33.5 11/15/2021: ALT 47; Magnesium 2.0 11/18/2021: BUN 26; Creatinine, Ser 1.28; Hemoglobin 12.7; Platelets 182; Potassium 3.9; Sodium 137    Lipid Panel    Component Value Date/Time   CHOL 194 09/30/2021 1155   TRIG 110 09/30/2021 1155   HDL 46 09/30/2021 1155   CHOLHDL 4.2 09/30/2021 1155   CHOLHDL 2.4 08/22/2019 1352   VLDL 5 03/23/2014 0603   LDLCALC 128 (H) 09/30/2021 1155   LDLCALC 78 08/22/2019 1352      Wt Readings from Last 3 Encounters:  12/08/21 170 lb 3.2 oz (77.2 kg)  11/17/21 168 lb 10.4 oz (76.5 kg)  09/30/21 176 lb 4.8 oz (80 kg)      Other studies Reviewed: Additional studies/ records that were reviewed today include: Review of hospital records. Review of the above records demonstrates:  Please see elsewhere in the note.     ASSESSMENT AND  PLAN:  Listeria: I did send a note to his infectious disease doctor.  TTE demonstrated no suggestion of any vegetation.  He has had no symptoms consistent with endocarditis.  He apparently had no recurrent fever after antibiotic therapy.  No change in therapy.  Hx of PE:  This was in 2022.  He is out of his antibiotic.  I did go back and review notes and see that he had what was demonstrated or thought to be unprovoked PE.  Therefore, I left a message with his primary provider in infectious disease doctor that he is out of this and it looks like they will want to restart him.   Cardiomyopathy: Look through multiple echoes.  His EF seems to fluctuate between 60% and 45%.  Some of this is likely dependent on how tachycardic he is in what is going on at the time of his echo.  At this point with a low normal ejection fraction and no symptoms I am not suggesting change in medications.   If he does not require echocardiography for any other reason in the next year this could be repeated.  I be happy to see him back for further suggestion should his ejection fraction consistently be recorded at the 50% or lower range or should he develop any symptoms.  Current medicines are reviewed at length with the patient today.  The patient does not have concerns regarding medicines.  The following changes have been made:  no change  Labs/ tests ordered today include: None No orders of the defined types were placed in this encounter.    Disposition:   FU with me as needed   Signed, Rollene Rotunda, MD  12/08/2021 10:27 AM    [facility]

## 2021-12-08 ENCOUNTER — Encounter: Payer: Self-pay | Admitting: Cardiology

## 2021-12-08 ENCOUNTER — Ambulatory Visit: Payer: BC Managed Care – PPO | Attending: Cardiology | Admitting: Cardiology

## 2021-12-08 ENCOUNTER — Ambulatory Visit (INDEPENDENT_AMBULATORY_CARE_PROVIDER_SITE_OTHER): Payer: BC Managed Care – PPO | Admitting: Internal Medicine

## 2021-12-08 ENCOUNTER — Encounter: Payer: Self-pay | Admitting: Internal Medicine

## 2021-12-08 ENCOUNTER — Other Ambulatory Visit: Payer: Self-pay

## 2021-12-08 VITALS — BP 115/79 | HR 75 | Temp 97.2°F | Wt 171.0 lb

## 2021-12-08 VITALS — BP 128/74 | HR 82 | Ht 68.0 in | Wt 170.2 lb

## 2021-12-08 DIAGNOSIS — L308 Other specified dermatitis: Secondary | ICD-10-CM | POA: Diagnosis not present

## 2021-12-08 DIAGNOSIS — J439 Emphysema, unspecified: Secondary | ICD-10-CM | POA: Diagnosis not present

## 2021-12-08 DIAGNOSIS — Z79899 Other long term (current) drug therapy: Secondary | ICD-10-CM

## 2021-12-08 DIAGNOSIS — I5043 Acute on chronic combined systolic (congestive) and diastolic (congestive) heart failure: Secondary | ICD-10-CM | POA: Diagnosis not present

## 2021-12-08 DIAGNOSIS — B20 Human immunodeficiency virus [HIV] disease: Secondary | ICD-10-CM | POA: Diagnosis not present

## 2021-12-08 DIAGNOSIS — R599 Enlarged lymph nodes, unspecified: Secondary | ICD-10-CM | POA: Diagnosis not present

## 2021-12-08 DIAGNOSIS — R7881 Bacteremia: Secondary | ICD-10-CM | POA: Diagnosis not present

## 2021-12-08 DIAGNOSIS — Z87891 Personal history of nicotine dependence: Secondary | ICD-10-CM

## 2021-12-08 DIAGNOSIS — J479 Bronchiectasis, uncomplicated: Secondary | ICD-10-CM

## 2021-12-08 DIAGNOSIS — Z86711 Personal history of pulmonary embolism: Secondary | ICD-10-CM

## 2021-12-08 LAB — CULTURE, BLOOD (ROUTINE X 2): Special Requests: ADEQUATE

## 2021-12-08 MED ORDER — TRIAMCINOLONE ACETONIDE 0.1 % EX CREA: 1.0000 | TOPICAL_CREAM | Freq: Two times a day (BID) | CUTANEOUS | 1 refills | Status: DC

## 2021-12-08 MED ORDER — ANORO ELLIPTA 62.5-25 MCG/ACT IN AEPB: 1.0000 | INHALATION_SPRAY | Freq: Every day | RESPIRATORY_TRACT | 2 refills | Status: DC

## 2021-12-08 MED ORDER — SULFAMETHOXAZOLE-TRIMETHOPRIM 800-160 MG PO TABS: 1.0000 | ORAL_TABLET | Freq: Every day | ORAL | 5 refills | Status: DC

## 2021-12-08 NOTE — Progress Notes (Unsigned)
RFV: follow up for hiv and hospitalization  Patient ID: [name], male   DOB: 02/26/1971, 51 y.o.   MRN: [id number]  HPI  [name] was recently hospitalized for listeriosis. Discharged on 9/6 with 2 wk of bactrim ds 2 tab bid. Here for follow up on tivicay and symtuza. CD 4 count of 60/VL 268. He reports that he Missed 1 month of medications just prior to being hospitalized. He was hospitalized for listeria bacteremia, where he has completed 2 addn week of bactrim DS 2 BID. He also has noticed eczema coming back but Only received small tube of triamcinolone for rash. Here with translator. Has been taking his medication regularly.(But mistook symtuza for RAL on pill chart)  Outpatient Encounter Medications as of 12/08/2021  Medication Sig   albuterol (VENTOLIN HFA) 108 (90 Base) MCG/ACT inhaler Inhale 2 puffs into the lungs every 6 (six) hours as needed for wheezing or shortness of breath.   aspirin EC 81 MG tablet Take 1 tablet (81 mg total) by mouth daily. Swallow whole.   Darunavir-Cobicistat-Emtricitabine-Tenofovir Alafenamide (SYMTUZA) 800-150-200-10 MG TABS TAKE 1 TABLET BY MOUTH 1 TIME A DAY WITH BREAKFAST   dolutegravir (TIVICAY) 50 MG tablet TAKE ONE TABLET BY MOUTH ONCE DAILY. STORE AT ROOM TEMPERATURE   guaiFENesin 200 MG/10ML LIQD Take 5 mLs by mouth every 4 (four) hours as needed for cough or to loosen phlegm.   ibuprofen (ADVIL) 200 MG tablet Take 400 mg by mouth every 6 (six) hours as needed for mild pain, fever or headache.   rosuvastatin (CRESTOR) 20 MG tablet Take 1 tablet (20 mg total) by mouth daily.   triamcinolone cream (KENALOG) 0.1 % Apply topically 2 (two) times daily as needed (itching).   umeclidinium-vilanterol (ANORO ELLIPTA) 62.5-25 MCG/ACT AEPB Inhale 1 puff into the lungs daily.   vitamin D3 (CHOLECALCIFEROL) 25 MCG tablet Take 1 tablet (1,000 Units total) by mouth daily.   cetirizine (ZYRTEC) 5 MG tablet Take 1 tablet (5 mg total) by mouth daily.    No facility-administered encounter medications on file as of 12/08/2021.     Patient Active Problem List   Diagnosis Date Noted   Adenopathy 12/07/2021   Acute on chronic combined systolic and diastolic HF (heart failure) ([name]) 12/07/2021   Bacteremia 11/17/2021   Listeriosis    Sepsis ([name]) 11/15/2021   Atopic dermatitis 09/30/2021   Allergy 09/30/2021   Steroid-induced hyperglycemia 09/05/2021   COPD exacerbation ([name]) 08/23/2021   Bronchiectasis without acute exacerbation ([name]) 04/22/2021   Pruritic dermatitis 04/03/2021   S/P colostomy ([name]) 02/06/2021   History of tuberculosis 11/01/2020   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   CKD (chronic kidney disease) stage 2, GFR 60-89 ml/min 11/01/2020   Type 2 diabetes mellitus ([name]) 05/17/2019   Genital warts 05/06/2018   Pulmonary emphysema ([name])    Condyloma 01/04/2017   Chronic systolic CHF (congestive heart failure) ([name]) 04/24/2016   Normochromic normocytic anemia 02/25/2016   Pneumonia 02/25/2016   AIDS (acquired immune deficiency syndrome) ([name])    S/P ORIF (open reduction internal fixation) fracture 03/21/2014   HIV disease ([name]) 09/18/2013     Health Maintenance Due  Topic Date Due   FOOT EXAM  Never done   OPHTHALMOLOGY EXAM  Never done   Zoster Vaccines- Shingrix (1 of 2) Never done   COVID-19 Vaccine (2 - Pfizer risk series) 01/26/2020   INFLUENZA VACCINE  10/14/2021   Diabetic kidney evaluation - Urine ACR  11/02/2021     Review of  Systems Review of Systems  Constitutional: Negative for fever, chills, diaphoresis, activity change, appetite change, fatigue and unexpected weight change.  HENT: Negative for congestion, sore throat, rhinorrhea, sneezing, trouble swallowing and sinus pressure.  Eyes: Negative for photophobia and visual disturbance.  Respiratory: Negative for cough, chest tightness, shortness of breath, wheezing and stridor.  Cardiovascular: Negative for chest pain,  palpitations and leg swelling.  Gastrointestinal: Negative for nausea, vomiting, abdominal pain, diarrhea, constipation, blood in stool, abdominal distention and anal bleeding.  Genitourinary: Negative for dysuria, hematuria, flank pain and difficulty urinating.  Musculoskeletal: Negative for myalgias, back pain, joint swelling, arthralgias and gait problem.  Skin: +rash Neurological: Negative for dizziness, tremors, weakness and light-headedness.  Hematological: Negative for adenopathy. Does not bruise/bleed easily.  Psychiatric/Behavioral: Negative for behavioral problems, confusion, sleep disturbance, dysphoric mood, decreased concentration and agitation.   Physical Exam   BP 115/79   Pulse 75   Temp (!) 97.2 F (36.2 C) (Temporal)   Wt 171 lb (77.6 kg)   BMI 26.00 kg/m   Physical Exam  Constitutional: He is oriented to person, place, and time. He appears well-developed and well-nourished. No distress.  HENT:  Mouth/Throat: Oropharynx is clear and moist. No oropharyngeal exudate.  Cardiovascular: Normal rate, regular rhythm and normal heart sounds. Exam reveals no gallop and no friction rub.  No murmur heard.  Pulmonary/Chest: Effort normal and breath sounds normal. No respiratory distress. He has no wheezes.  Abdominal: Soft. Bowel sounds are normal. He exhibits no distension. There is no tenderness.  Lymphadenopathy:  He has no cervical adenopathy.  Neurological: He is alert and oriented to person, place, and time.  Skin: +dry scaly rash to face and dry arm. Psychiatric: He has a normal mood and affect. His behavior is normal.   Lab Results  Component Value Date   CD4TCELL 6 (L) 12/01/2021   Lab Results  Component Value Date   CD4TABS 60 (L) 12/01/2021   CD4TABS 95 (L) 11/18/2021   CD4TABS 104 (L) 08/25/2021   Lab Results  Component Value Date   HIV1RNAQUANT 268 (H) 12/01/2021   No results found for: "HEPBSAB" Lab Results  Component Value Date   LABRPR NON  REACTIVE 11/11/2021    CBC Lab Results  Component Value Date   WBC 5.4 11/18/2021   RBC 3.83 (L) 11/18/2021   HGB 12.7 (L) 11/18/2021   HCT 37.3 (L) 11/18/2021   PLT 182 11/18/2021   MCV 97.4 11/18/2021   MCH 33.2 11/18/2021   MCHC 34.0 11/18/2021   RDW 12.7 11/18/2021   LYMPHSABS 1.1 11/17/2021   MONOABS 0.7 11/17/2021   EOSABS 0.0 11/17/2021    BMET Lab Results  Component Value Date   NA 137 11/18/2021   K 3.9 11/18/2021   CL 105 11/18/2021   CO2 25 11/18/2021   GLUCOSE 186 (H) 11/18/2021   BUN 26 (H) 11/18/2021   CREATININE 1.28 (H) 11/18/2021   CALCIUM 8.7 (L) 11/18/2021   GFRNONAA >60 11/18/2021   GFRAA 79 12/04/2019    Assessment and Plan  COPD =Inhaler clarified- and gave him rx for anoro  Eczema =refill triamcinolone -large tube  HIV disease = continue on tivicay and symtuza with plan to have him pick up hiv meds at clinic instead of pharmacy so that it may reduce the # of unintentional breaks -- helping to minimize access barriers due to health literacy  OI Proph = Also sent in rx for bactrim daily  Unprovoked PE (in the past) = clarified with cardiology  that he will need daily eliquis

## 2021-12-08 NOTE — Patient Instructions (Signed)
    Follow-Up: At [facility], you and your health needs are our priority.  As part of our continuing mission to provide you with exceptional heart care, we have created designated Provider Care Teams.  These Care Teams include your primary Cardiologist (physician) and Advanced Practice Providers (APPs -  Physician Assistants and Nurse Practitioners) who all work together to provide you with the care you need, when you need it.  We recommend signing up for the patient portal called "MyChart".  Sign up information is provided on this After Visit Summary.  MyChart is used to connect with patients for Virtual Visits (Telemedicine).  Patients are able to view lab/test results, encounter notes, upcoming appointments, etc.  Non-urgent messages can be sent to your provider as well.   To learn more about what you can do with MyChart, go to [email or web address].    Your next appointment:   As needed        

## 2021-12-09 ENCOUNTER — Other Ambulatory Visit (HOSPITAL_COMMUNITY): Payer: Self-pay

## 2021-12-09 LAB — CBC WITH DIFFERENTIAL/PLATELET
Absolute Monocytes: 349 cells/uL (ref 200–950)
Basophils Absolute: 10 cells/uL (ref 0–200)
Basophils Relative: 0.3 %
Eosinophils Absolute: 579 cells/uL — ABNORMAL HIGH (ref 15–500)
Eosinophils Relative: 18.1 %
HCT: 37.9 % — ABNORMAL LOW (ref 38.5–50.0)
Hemoglobin: 12.6 g/dL — ABNORMAL LOW (ref 13.2–17.1)
Lymphs Abs: 938 cells/uL (ref 850–3900)
MCH: 33 pg (ref 27.0–33.0)
MCHC: 33.2 g/dL (ref 32.0–36.0)
MCV: 99.2 fL (ref 80.0–100.0)
MPV: 10.1 fL (ref 7.5–12.5)
Monocytes Relative: 10.9 %
Neutro Abs: 1325 cells/uL — ABNORMAL LOW (ref 1500–7800)
Neutrophils Relative %: 41.4 %
Platelets: 164 10*3/uL (ref 140–400)
RBC: 3.82 10*6/uL — ABNORMAL LOW (ref 4.20–5.80)
RDW: 12.6 % (ref 11.0–15.0)
Total Lymphocyte: 29.3 %
WBC: 3.2 10*3/uL — ABNORMAL LOW (ref 3.8–10.8)

## 2021-12-09 LAB — BASIC METABOLIC PANEL
BUN: 19 mg/dL (ref 7–25)
CO2: 30 mmol/L (ref 20–32)
Calcium: 9.7 mg/dL (ref 8.6–10.3)
Chloride: 104 mmol/L (ref 98–110)
Creat: 1.17 mg/dL (ref 0.70–1.30)
Glucose, Bld: 97 mg/dL (ref 65–99)
Potassium: 4.2 mmol/L (ref 3.5–5.3)
Sodium: 140 mmol/L (ref 135–146)

## 2021-12-11 LAB — MISCELLANEOUS TEST

## 2021-12-12 ENCOUNTER — Telehealth: Payer: Self-pay

## 2021-12-12 ENCOUNTER — Other Ambulatory Visit: Payer: Self-pay | Admitting: *Deleted

## 2021-12-12 DIAGNOSIS — J479 Bronchiectasis, uncomplicated: Secondary | ICD-10-CM

## 2021-12-12 NOTE — Telephone Encounter (Signed)
[facility] Patient Advocate Encounter  Patient's medications have been couriered to [facility] from [place] and will be picked up 12/15/21.  [place] , [place] Patient [facility] for Infectious Disease Phone: [phone] Fax:  [phone]

## 2021-12-15 ENCOUNTER — Other Ambulatory Visit: Payer: Self-pay

## 2021-12-15 ENCOUNTER — Encounter: Payer: Self-pay | Admitting: Internal Medicine

## 2021-12-15 ENCOUNTER — Ambulatory Visit (INDEPENDENT_AMBULATORY_CARE_PROVIDER_SITE_OTHER): Payer: BC Managed Care – PPO | Admitting: Internal Medicine

## 2021-12-15 ENCOUNTER — Ambulatory Visit: Payer: BC Managed Care – PPO | Admitting: Pulmonary Disease

## 2021-12-15 VITALS — BP 117/74 | HR 90 | Resp 16 | Ht 68.0 in | Wt 174.2 lb

## 2021-12-15 DIAGNOSIS — B20 Human immunodeficiency virus [HIV] disease: Secondary | ICD-10-CM | POA: Diagnosis not present

## 2021-12-15 DIAGNOSIS — L308 Other specified dermatitis: Secondary | ICD-10-CM | POA: Diagnosis not present

## 2021-12-15 MED ORDER — TRIAMCINOLONE ACETONIDE 0.1 % EX OINT: TOPICAL_OINTMENT | Freq: Two times a day (BID) | CUTANEOUS | 0 refills | Status: DC

## 2021-12-15 NOTE — Progress Notes (Signed)
RFV: rash Patient ID: JEREMIAN VELARDE, male   DOB: Jul 14, 1970, 51 y.o.   MRN: [id number]  HPI [name] is 51yo M with hiv disease, language barrier at times has made it difficult for adherence. He reports he was  Unable to afford cream/ointment fpr eczema that was given from last visit. He has lost his insurance Outpatient Encounter Medications as of 12/15/2021  Medication Sig   aspirin EC 81 MG tablet Take 1 tablet (81 mg total) by mouth daily. Swallow whole.   cetirizine (ZYRTEC) 5 MG tablet Take 1 tablet (5 mg total) by mouth daily.   Darunavir-Cobicistat-Emtricitabine-Tenofovir Alafenamide (SYMTUZA) 800-150-200-10 MG TABS TAKE 1 TABLET BY MOUTH 1 TIME A DAY WITH BREAKFAST   dolutegravir (TIVICAY) 50 MG tablet TAKE ONE TABLET BY MOUTH ONCE DAILY. STORE AT ROOM TEMPERATURE   guaiFENesin 200 MG/10ML LIQD Take 5 mLs by mouth every 4 (four) hours as needed for cough or to loosen phlegm.   ibuprofen (ADVIL) 200 MG tablet Take 400 mg by mouth every 6 (six) hours as needed for mild pain, fever or headache.   rosuvastatin (CRESTOR) 20 MG tablet Take 1 tablet (20 mg total) by mouth daily.   sulfamethoxazole-trimethoprim (BACTRIM DS) 800-160 MG tablet Take 1 tablet by mouth daily.   triamcinolone 0.1%-Aquaphor equivlanet 1:1 ointment mixture Apply topically 2 (two) times daily.   vitamin D3 (CHOLECALCIFEROL) 25 MCG tablet Take 1 tablet (1,000 Units total) by mouth daily.   umeclidinium-vilanterol (ANORO ELLIPTA) 62.5-25 MCG/ACT AEPB Inhale 1 puff into the lungs daily. (Patient not taking: Reported on 12/15/2021)   [DISCONTINUED] triamcinolone cream (KENALOG) 0.1 % Apply topically 2 (two) times daily as needed (itching). (Patient not taking: Reported on 12/15/2021)   [DISCONTINUED] triamcinolone cream (KENALOG) 0.1 % Apply 1 Application topically 2 (two) times daily. (Patient not taking: Reported on 12/15/2021)   No facility-administered encounter medications on file as of 12/15/2021.     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   CKD (chronic kidney disease) stage 2, GFR 60-89 ml/min 11/01/2020   Type 2 diabetes mellitus ([place]) 05/17/2019   Genital warts 05/06/2018   Pulmonary emphysema ([place])    Condyloma 0000000   Chronic systolic CHF (congestive heart failure) ([place]) 04/24/2016   Normochromic normocytic anemia 02/25/2016   Pneumonia 02/25/2016   AIDS (acquired immune deficiency syndrome) ([place])    S/P ORIF (open reduction internal fixation) fracture 03/21/2014   HIV disease ([place]) 09/18/2013     Health Maintenance Due  Topic Date Due   FOOT EXAM  Never done   OPHTHALMOLOGY EXAM  Never done   Zoster Vaccines- Shingrix (1 of 2) Never done   COVID-19 Vaccine (2 - Pfizer risk series) 01/26/2020   INFLUENZA VACCINE  10/14/2021   Diabetic kidney evaluation - Urine ACR  11/02/2021     Review of Systems Review of Systems  Constitutional: Negative for fever, chills, diaphoresis, activity change, appetite change, fatigue and unexpected weight change.  HENT: Negative for congestion, sore throat, rhinorrhea, sneezing, trouble  swallowing and sinus pressure.  Eyes: Negative for photophobia and visual disturbance.  Respiratory: Negative for cough, chest tightness, shortness of breath, wheezing and stridor.  Cardiovascular: Negative for chest pain, palpitations and leg swelling.  Gastrointestinal: Negative for nausea, vomiting, abdominal pain,  diarrhea, constipation, blood in stool, abdominal distention and anal bleeding.  Genitourinary: Negative for dysuria, hematuria, flank pain and difficulty urinating.  Musculoskeletal: Negative for myalgias, back pain, joint swelling, arthralgias and gait problem.  Skin: Negative for color change, pallor, rash and wound.  Neurological: Negative for dizziness, tremors, weakness and light-headedness.  Hematological: Negative for adenopathy. Does not bruise/bleed easily.  Psychiatric/Behavioral: Negative for behavioral problems, confusion, sleep disturbance, dysphoric mood, decreased concentration and agitation.   Physical Exam   BP 117/74   Pulse 90   Resp 16   Ht '5\' 8"'$  (1.727 m)   Wt 174 lb 3.2 oz (79 kg)   SpO2 96%   BMI 26.49 kg/m   Physical Exam  Constitutional: He is oriented to person, place, and time. He appears well-developed and well-nourished. No distress.  HENT:  Mouth/Throat: Oropharynx is clear and moist. No oropharyngeal exudate.  Cardiovascular: Normal rate, regular rhythm and normal heart sounds. Exam reveals no gallop and no friction rub.  No murmur heard.  Pulmonary/Chest: Effort normal and breath sounds normal. No respiratory distress. He has no wheezes.  Abdominal: Soft. Bowel sounds are normal. He exhibits no distension. There is no tenderness.  Lymphadenopathy:  He has no cervical adenopathy.  Neurological: He is alert and oriented to person, place, and time.  Skin: Skin is warm and dry. No rash noted. No erythema.  Psychiatric: He has a normal mood and affect. His behavior is normal.   Lab Results  Component Value Date   CD4TCELL 6 (L) 12/01/2021   Lab Results  Component Value Date   CD4TABS 60 (L) 12/01/2021   CD4TABS 95 (L) 11/18/2021   CD4TABS 104 (L) 08/25/2021   Lab Results  Component Value Date   HIV1RNAQUANT 268 (H) 12/01/2021   No results found for: "HEPBSAB" Lab Results  Component Value Date   LABRPR NON REACTIVE 11/11/2021    CBC Lab  Results  Component Value Date   WBC 3.2 (L) 12/08/2021   RBC 3.82 (L) 12/08/2021   HGB 12.6 (L) 12/08/2021   HCT 37.9 (L) 12/08/2021   PLT 164 12/08/2021   MCV 99.2 12/08/2021   MCH 33.0 12/08/2021   MCHC 33.2 12/08/2021   RDW 12.6 12/08/2021   LYMPHSABS 938 12/08/2021   MONOABS 0.7 11/17/2021   EOSABS 579 (H) 12/08/2021    BMET Lab Results  Component Value Date   NA 140 12/08/2021   K 4.2 12/08/2021   CL 104 12/08/2021   CO2 30 12/08/2021   GLUCOSE 97 12/08/2021   BUN 19 12/08/2021   CREATININE 1.17 12/08/2021   CALCIUM 9.7 12/08/2021   GFRNONAA >60 11/18/2021   GFRAA 79 12/04/2019      Assessment and Plan  Hiv disease =Picked up tivicay/symtuza in office. He is able to speak to how to take it  Eczema= Found $20 copay  Will see back in 4 wk for next drug pick up. Will do labs at that time. And assess rash

## 2022-01-13 LAB — HISTOPLASMA ANTIGEN, URINE: Histoplasma Antigen, urine: <0.5 (ref <0.5)

## 2022-01-14 ENCOUNTER — Telehealth: Payer: Self-pay

## 2022-01-14 NOTE — Telephone Encounter (Signed)
Per front desk disregard message, patient's medicine was delivered.

## 2022-01-14 NOTE — Telephone Encounter (Signed)
Pt stated he has run out of medicine and requested a call from Dr. Storm Frisk nurse with the number on file. Pt could not confirm name of medicine.

## 2022-01-15 ENCOUNTER — Telehealth: Payer: Self-pay

## 2022-01-15 NOTE — Telephone Encounter (Signed)
[facility] Patient Advocate Encounter  Patient's medications have been couriered to [facility] from [place] and will be picked up 01/19/22.  [place] , [place] Patient [facility] for Infectious Disease Phone: [phone] Fax:  [phone]

## 2022-01-18 NOTE — Progress Notes (Unsigned)
01/18/2022  HPI: [name] is a 51 y.o. male who presents to the [place] clinic for HIV follow-up.  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   CKD (chronic kidney disease) stage 2, GFR 60-89 ml/min 11/01/2020   Type 2 diabetes mellitus ([place]) 05/17/2019   Genital warts 05/06/2018   Pulmonary emphysema ([place])    Condyloma 35/36/1443   Chronic systolic CHF (congestive heart failure) ([place]) 04/24/2016   Normochromic normocytic anemia 02/25/2016   Pneumonia 02/25/2016   AIDS (acquired immune deficiency syndrome) ([place])    S/P ORIF (open reduction internal fixation) fracture 03/21/2014   HIV disease ([place]) 09/18/2013    Patient's Medications  New Prescriptions   No medications on file  Previous Medications   ASPIRIN EC 81 MG TABLET    Take 1 tablet (81 mg total) by mouth daily. Swallow whole.   CETIRIZINE (ZYRTEC) 5 MG TABLET    Take 1 tablet (5 mg total) by mouth daily.   DARUNAVIR-COBICISTAT-EMTRICITABINE-TENOFOVIR ALAFENAMIDE (SYMTUZA) 800-150-200-10 MG TABS    TAKE 1 TABLET BY MOUTH 1 TIME A DAY WITH BREAKFAST   DOLUTEGRAVIR (TIVICAY) 50 MG TABLET    TAKE ONE TABLET BY MOUTH ONCE DAILY. STORE AT ROOM TEMPERATURE   GUAIFENESIN 200 MG/10ML LIQD    Take 5 mLs by mouth every 4 (four) hours as needed for cough or to loosen phlegm.   IBUPROFEN (ADVIL) 200 MG TABLET    Take 400 mg by mouth every 6 (six) hours as needed for mild pain, fever  or headache.   ROSUVASTATIN (CRESTOR) 20 MG TABLET    Take 1 tablet (20 mg total) by mouth daily.   SULFAMETHOXAZOLE-TRIMETHOPRIM (BACTRIM DS) 800-160 MG TABLET    Take 1 tablet by mouth daily.   TRIAMCINOLONE 0.1%-AQUAPHOR EQUIVLANET 1:1 OINTMENT MIXTURE    Apply topically 2 (two) times daily.   UMECLIDINIUM-VILANTEROL (ANORO ELLIPTA) 62.5-25 MCG/ACT AEPB    Inhale 1 puff into the lungs daily.   VITAMIN D3 (CHOLECALCIFEROL) 25 MCG TABLET    Take 1 tablet (1,000 Units total) by mouth daily.  Modified Medications   No medications on file  Discontinued Medications   No medications on file    Allergies: Allergies  Allergen Reactions   Metformin And Related Swelling and Rash    Pt doesn't remember this allergy, but does state he gets rash. Unsure if related to medication.    Past Medical History: Past Medical History:  Diagnosis Date   Acute pulmonary embolus ([place]) 02/24/2016   Dx December 2017 taking Eliquis 5mg  BID   Bronchiectasis with acute exacerbation ([place])    Depression    "stress and depression for any man is common" (03/21/2014)   Diabetes mellitus without complication ([place])    DVT (deep venous thrombosis) ([place]) 11/03/2020   Dyspnea    Genital warts 01/04/2017   Hepatitis    "I don't know what hepatitis I have"   HIV disease ([place])    MSSA bacteremia    Pneumonia due to pneumocystis  jiroveci ([name]) 04/24/2016   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)    TB (pulmonary tuberculosis)    previously treated according to refugee documentation    Social History: Social History   Socioeconomic History   Marital status: Widowed    Spouse name: Not on file   Number of children: Not on file   Years of education: Not on file   Highest education level: Not on file  Occupational History   Not on file  Tobacco Use   Smoking status: Former    Packs/day: 0.50    Years: 6.00    Total pack years: 3.00    Types: Cigarettes    Quit date: 03/16/2001     Years since quitting: 20.8   Smokeless tobacco: Never   Tobacco comments:    "quit smoking ~ 2003"  Vaping Use   Vaping Use: Never used  Substance and Sexual Activity   Alcohol use: Not Currently    Comment: drinks bottled beer intermittently   Drug use: No   Sexual activity: Not Currently    Partners: Female    Comment: declined condoms  Other Topics Concern   Not on file  Social History Narrative   As of 09/18/2013:   -Arrived in [place]: September 05, 2013    -Ref[facility]e from [facility], spent 4 years in refugee camp in [place].    -Language: [name], requires Architect (speaks essentially no [place])   -Education: no formal education, previously worked as a Product/process development scientist: family phone number [phone], caseworker is Lannette Donath Dar [place] [phone] (with [employer])   -lives with daughter and three sons   -denies alcohol, drug, tobacco abuse      [facility] (02/28/16):   Patient now admitted to drinking bottled beer. Reports continued tobacco abstinence. Denies any bird or mold exposure. No pets currently. No recent travel.   Social Determinants of Health   Financial Resource Strain: Not on file  Food Insecurity: Not on file  Transportation Needs: Not on file  Physical Activity: Not on file  Stress: Not on file  Social Connections: Not on file    Labs: Lab Results  Component Value Date   HIV1RNAQUANT 268 (H) 12/01/2021   HIV1RNAQUANT 43,100 11/15/2021   HIV1RNAQUANT 40 08/22/2021   CD4TABS 60 (L) 12/01/2021   CD4TABS 95 (L) 11/18/2021   CD4TABS 104 (L) 08/25/2021    RPR and STI Lab Results  Component Value Date   LABRPR NON REACTIVE 11/11/2021   LABRPR NON REACTIVE 11/02/2020   LABRPR REACTIVE (A) 08/22/2019   LABRPR NON-REACTIVE 08/22/2018   LABRPR NON-REACTIVE 05/18/2018   RPRTITER 1:1 (H) 08/22/2019    STI Results GC CT  Latest Ref Rng & Units NEGATIVE NEGATIVE  03/22/2014  5:48 AM NEGATIVE  NEGATIVE     Hepatitis B Lab Results  Component  Value Date   HEPBSAG NEGATIVE 09/26/2013   HEPBCAB REACTIVE (A) 09/26/2013   Hepatitis C No results found for: "HEPCAB", "HCVRNAPCRQN" Hepatitis A Lab Results  Component Value Date   HAV REACTIVE (A) 09/26/2013   Lipids: Lab Results  Component Value Date   CHOL 194 09/30/2021   TRIG 110 09/30/2021   HDL 46 09/30/2021   CHOLHDL 4.2 09/30/2021   VLDL 5 03/23/2014   LDLCALC 128 (H) 09/30/2021    Current HIV Regimen: Tivicay and Symtuza   Assessment: Patient presents today for HIV follow-up on Symtuza and Tivicay. Patient has had issues with adherence to medications in  the past and most recent HIV RNA was 268 on 12/01/21. Patient's CD4 count has aso remained <200 despite treatment, with most recent CD4 being 60 on 12/01/21. Consequently, he has remained on Bactrim for PJP ppx. Spent a significant amount of time with the patient stressing the importance of adherence to his medications to hopefully become undetectable and strengthen his immune system.   [name] has his medications [facility] at the request of the patient. This is being done in an attempt to improve adherence and prevent issues caused by lack of a swahili interpreter at [facility], which has caused issues with patient being able to obtain his medication in the past.   Patient was also noted to have a dry, scaly rash to face and arm in visit on 12/08/21.   Patient offered influenza vaccine during today's visit and   Please note, today's visit was conducted in the presence of a [name]-speaking interpreter.   Plan: Continue Tivicay + Symtuza, Bactrim Daily  Check HepB surface Ab?  F/U CD4, HIV RNA      Jani Gravel, PharmD PGY-2 Infectious Diseases Resident  01/18/2022 2:11 PM

## 2022-01-19 ENCOUNTER — Ambulatory Visit: Payer: BC Managed Care – PPO | Admitting: Pharmacist

## 2022-01-19 ENCOUNTER — Ambulatory Visit: Payer: BC Managed Care – PPO | Admitting: Adult Health

## 2022-01-22 ENCOUNTER — Other Ambulatory Visit (HOSPITAL_COMMUNITY): Payer: Self-pay

## 2022-02-03 ENCOUNTER — Telehealth: Payer: Self-pay

## 2022-02-03 NOTE — Telephone Encounter (Signed)
[facility] Patient Advocate Encounter  Patient's medications have been [facility]ered to [facility] from [facility] and will be picked up 03/02/22.    Clearance Coots , CPhT Specialty Pharmacy Patient [facility] for Infectious Disease Phone: [phone] Fax:  [phone]

## 2022-02-16 ENCOUNTER — Ambulatory Visit: Payer: BC Managed Care – PPO | Admitting: Pharmacist

## 2022-03-02 ENCOUNTER — Ambulatory Visit: Payer: BC Managed Care – PPO | Admitting: Internal Medicine

## 2022-03-06 ENCOUNTER — Other Ambulatory Visit (HOSPITAL_COMMUNITY): Payer: Self-pay

## 2022-03-11 ENCOUNTER — Telehealth: Payer: Self-pay

## 2022-03-11 NOTE — Telephone Encounter (Signed)
[facility] Patient Advocate Encounter  Patient's medications have been [facility]riered to [facility] from [facility] and will be picked up 03/17/22.  Tivicay & Symtuza  Clearance Coots , CPhT Specialty Pharmacy Patient [facility] for Infectious Disease Phone: [phone] Fax:  [phone]

## 2022-03-17 ENCOUNTER — Other Ambulatory Visit: Payer: Self-pay

## 2022-03-17 ENCOUNTER — Ambulatory Visit (INDEPENDENT_AMBULATORY_CARE_PROVIDER_SITE_OTHER): Payer: 59 | Admitting: Internal Medicine

## 2022-03-17 ENCOUNTER — Encounter: Payer: Self-pay | Admitting: Internal Medicine

## 2022-03-17 VITALS — BP 106/73 | HR 94 | Resp 16 | Ht 68.0 in | Wt 173.0 lb

## 2022-03-17 DIAGNOSIS — R053 Chronic cough: Secondary | ICD-10-CM

## 2022-03-17 DIAGNOSIS — E785 Hyperlipidemia, unspecified: Secondary | ICD-10-CM

## 2022-03-17 DIAGNOSIS — B20 Human immunodeficiency virus [HIV] disease: Secondary | ICD-10-CM | POA: Diagnosis not present

## 2022-03-17 MED ORDER — TIVICAY 50 MG PO TABS: ORAL_TABLET | ORAL | 11 refills | Status: DC

## 2022-03-17 MED ORDER — GUAIFENESIN-DM 100-10 MG/5ML PO SYRP: 5.0000 mL | ORAL_SOLUTION | ORAL | 1 refills | Status: DC | PRN

## 2022-03-17 MED ORDER — SYMTUZA 800-150-200-10 MG PO TABS: ORAL_TABLET | ORAL | 11 refills | Status: DC

## 2022-03-17 MED ORDER — ROSUVASTATIN CALCIUM 20 MG PO TABS: 20.0000 mg | ORAL_TABLET | Freq: Every day | ORAL | 11 refills | Status: DC

## 2022-03-17 MED ORDER — SULFAMETHOXAZOLE-TRIMETHOPRIM 800-160 MG PO TABS
1.0000 | ORAL_TABLET | Freq: Every day | ORAL | 11 refills | Status: DC
Start: 2022-03-17 — End: 2022-04-16

## 2022-03-17 NOTE — Progress Notes (Signed)
RFV: follow up for hiv disease, with interpretor  Patient ID: [name], male   DOB: 04/12/1970, 52 y.o.   MRN: [id number]  HPI [name] is a 52yo M with advanced hiv disease with low level viremia, on tivicay, and symtuza, cd 4 count of 60/VL 243(sept 2023) taking art regularly but ran out of bactrim   Has ongoing coughing, mostly at night x 1 month, with alittle abit of phlegm, no fever. No nightsweats. No loss of weight. No doe.   Outpatient Encounter Medications as of 03/17/2022  Medication Sig   aspirin EC 81 MG tablet Take 1 tablet (81 mg total) by mouth daily. Swallow whole.   Darunavir-Cobicistat-Emtricitabine-Tenofovir Alafenamide (SYMTUZA) 800-150-200-10 MG TABS TAKE 1 TABLET BY MOUTH 1 TIME A DAY WITH BREAKFAST   dolutegravir (TIVICAY) 50 MG tablet TAKE ONE TABLET BY MOUTH ONCE DAILY. STORE AT ROOM TEMPERATURE   ibuprofen (ADVIL) 200 MG tablet Take 400 mg by mouth every 6 (six) hours as needed for mild pain, fever or headache.   rosuvastatin (CRESTOR) 20 MG tablet Take 1 tablet (20 mg total) by mouth daily.   sulfamethoxazole-trimethoprim (BACTRIM DS) 800-160 MG tablet Take 1 tablet by mouth daily.   triamcinolone 0.1%-Aquaphor equivlanet 1:1 ointment mixture Apply topically 2 (two) times daily.   vitamin D3 (CHOLECALCIFEROL) 25 MCG tablet Take 1 tablet (1,000 Units total) by mouth daily.   cetirizine (ZYRTEC) 5 MG tablet Take 1 tablet (5 mg total) by mouth daily.   guaiFENesin 200 MG/10ML LIQD Take 5 mLs by mouth every 4 (four) hours as needed for cough or to loosen phlegm. (Patient not taking: Reported on 03/17/2022)   umeclidinium-vilanterol (ANORO ELLIPTA) 62.5-25 MCG/ACT AEPB Inhale 1 puff into the lungs daily. (Patient not taking: Reported on 12/15/2021)   No facility-administered encounter medications on file as of 03/17/2022.     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   CKD (chronic kidney disease) stage 2, GFR 60-89 ml/min 11/01/2020   Type 2 diabetes mellitus ([place]) 05/17/2019   Genital warts 05/06/2018   Pulmonary emphysema ([place])    Condyloma 0000000   Chronic systolic CHF (congestive heart failure) ([place]) 04/24/2016   Normochromic normocytic anemia 02/25/2016   Pneumonia 02/25/2016   AIDS (acquired immune deficiency syndrome) ([place])    S/P ORIF (open reduction internal fixation) fracture 03/21/2014   HIV disease ([place]) 09/18/2013     Health Maintenance Due  Topic Date Due   FOOT EXAM  Never done   OPHTHALMOLOGY EXAM  Never done   Zoster Vaccines- Shingrix (1 of 2) Never done   COVID-19 Vaccine (2 - Pfizer risk series) 01/26/2020   INFLUENZA VACCINE  10/14/2021   Diabetic kidney evaluation - Urine ACR  11/02/2021     Review of Systems 12 point ros is negative except what is mentioned in hpi Physical Exam   BP 106/73   Pulse 94   Resp 16   Ht '5\' 8"'$  (1.727 m)   Wt 173 lb (78.5 kg)  SpO2 99%   BMI 26.30 kg/m   Physical Exam  Constitutional: He is oriented to person, place, and time. He appears well-developed and well-nourished. No distress.  HENT:  Mouth/Throat: Oropharynx is clear and moist. No oropharyngeal exudate.  Cardiovascular: Normal rate, regular rhythm and normal heart sounds. Exam reveals no gallop and no friction rub.  No murmur heard.  Pulmonary/Chest: Effort normal and breath sounds normal. No respiratory distress. He has no wheezes.  Abdominal: Soft. Bowel sounds are normal.  He exhibits no distension. There is no tenderness.  Lymphadenopathy:  He has no cervical adenopathy.  Neurological: He is alert and oriented to person, place, and time.  Skin: Skin is warm and dry. No rash noted. No erythema.  Psychiatric: He has a normal mood and affect. His behavior is normal.   Lab Results  Component Value Date   CD4TCELL 6 (L) 12/01/2021   Lab Results  Component Value Date   CD4TABS 60 (L) 12/01/2021   CD4TABS 95 (L) 11/18/2021   CD4TABS 104 (L) 08/25/2021   Lab Results  Component Value Date   HIV1RNAQUANT 268 (H) 12/01/2021   No results found for: "HEPBSAB" Lab Results  Component Value Date   LABRPR NON REACTIVE 11/11/2021    CBC Lab Results  Component Value Date   WBC 3.2 (L) 12/08/2021   RBC 3.82 (L) 12/08/2021   HGB 12.6 (L) 12/08/2021   HCT 37.9 (L) 12/08/2021   PLT 164 12/08/2021   MCV 99.2 12/08/2021   MCH 33.0 12/08/2021   MCHC 33.2 12/08/2021   RDW 12.6 12/08/2021   LYMPHSABS 938 12/08/2021   MONOABS 0.7 11/17/2021   EOSABS 579 (H) 12/08/2021    BMET Lab Results  Component Value Date   NA 140 12/08/2021   K 4.2 12/08/2021   CL 104 12/08/2021   CO2 30 12/08/2021   GLUCOSE 97 12/08/2021   BUN 19 12/08/2021   CREATININE 1.17 12/08/2021   CALCIUM 9.7 12/08/2021   GFRNONAA >60 11/18/2021   GFRAA 79 12/04/2019      Assessment and Plan  HIV disease = will check labs and give refills plus bactrim  Chronic cough = will give cough medicine. Will do afb sputum culture. Low suspicion for acute bacterial infection or  exacerbation of  emphysema.  Health maintenance = will do low dose crestor for cardioprotective effect/reprieve trial  Has had flu vaccine in the last 3 months (out of system)

## 2022-03-18 LAB — T-HELPER CELL (CD4) - (RCID CLINIC ONLY)
CD4 % Helper T Cell: 8 % — ABNORMAL LOW (ref 33–65)
CD4 T Cell Abs: 75 /uL — ABNORMAL LOW (ref 400–1790)

## 2022-03-19 LAB — LIPID PANEL
Cholesterol: 194 mg/dL (ref <200)
HDL: 52 mg/dL (ref >=40)
LDL Cholesterol (Calc): 119 mg/dL (calc) — ABNORMAL HIGH
Non-HDL Cholesterol (Calc): 142 mg/dL (calc) — ABNORMAL HIGH (ref <130)
Total CHOL/HDL Ratio: 3.7 (calc) (ref <5.0)
Triglycerides: 115 mg/dL (ref <150)

## 2022-03-19 LAB — CBC WITH DIFFERENTIAL/PLATELET
Absolute Monocytes: 385 cells/uL (ref 200–950)
Basophils Absolute: 19 cells/uL (ref 0–200)
Basophils Relative: 0.5 %
Eosinophils Absolute: 163 cells/uL (ref 15–500)
Eosinophils Relative: 4.4 %
HCT: 41.9 % (ref 38.5–50.0)
Hemoglobin: 14.2 g/dL (ref 13.2–17.1)
Lymphs Abs: 1117 cells/uL (ref 850–3900)
MCH: 32.3 pg (ref 27.0–33.0)
MCHC: 33.9 g/dL (ref 32.0–36.0)
MCV: 95.2 fL (ref 80.0–100.0)
MPV: 9.6 fL (ref 7.5–12.5)
Monocytes Relative: 10.4 %
Neutro Abs: 2017 cells/uL (ref 1500–7800)
Neutrophils Relative %: 54.5 %
Platelets: 266 10*3/uL (ref 140–400)
RBC: 4.4 10*6/uL (ref 4.20–5.80)
RDW: 11.8 % (ref 11.0–15.0)
Total Lymphocyte: 30.2 %
WBC: 3.7 10*3/uL — ABNORMAL LOW (ref 3.8–10.8)

## 2022-03-19 LAB — COMPLETE METABOLIC PANEL WITH GFR
AG Ratio: 0.9 (calc) — ABNORMAL LOW (ref 1.0–2.5)
ALT: 9 U/L (ref 9–46)
AST: 13 U/L (ref 10–35)
Albumin: 3.8 g/dL (ref 3.6–5.1)
Alkaline phosphatase (APISO): 94 U/L (ref 35–144)
BUN: 17 mg/dL (ref 7–25)
CO2: 31 mmol/L (ref 20–32)
Calcium: 9 mg/dL (ref 8.6–10.3)
Chloride: 96 mmol/L — ABNORMAL LOW (ref 98–110)
Creat: 1.22 mg/dL (ref 0.70–1.30)
Globulin: 4.2 g/dL (calc) — ABNORMAL HIGH (ref 1.9–3.7)
Glucose, Bld: 158 mg/dL — ABNORMAL HIGH (ref 65–99)
Potassium: 3.7 mmol/L (ref 3.5–5.3)
Sodium: 136 mmol/L (ref 135–146)
Total Bilirubin: 0.2 mg/dL (ref 0.2–1.2)
Total Protein: 8 g/dL (ref 6.1–8.1)
eGFR: 72 mL/min/{1.73_m2} (ref >=60)

## 2022-03-19 LAB — HIV-1 RNA QUANT-NO REFLEX-BLD
HIV 1 RNA Quant: 87 Copies/mL — ABNORMAL HIGH
HIV-1 RNA Quant, Log: 1.94 Log cps/mL — ABNORMAL HIGH

## 2022-03-19 LAB — RPR: RPR Ser Ql: NONREACTIVE

## 2022-04-06 ENCOUNTER — Other Ambulatory Visit (HOSPITAL_COMMUNITY): Payer: Self-pay

## 2022-04-16 ENCOUNTER — Other Ambulatory Visit: Payer: Self-pay | Admitting: Pharmacist

## 2022-04-16 ENCOUNTER — Other Ambulatory Visit (HOSPITAL_COMMUNITY): Payer: Self-pay

## 2022-04-16 DIAGNOSIS — B20 Human immunodeficiency virus [HIV] disease: Secondary | ICD-10-CM

## 2022-04-16 MED ORDER — TIVICAY 50 MG PO TABS
50.0000 mg | ORAL_TABLET | Freq: Every day | ORAL | 11 refills | Status: DC
  Filled 2022-04-16 – 2022-04-17 (×2): qty 30, 30d supply, fill #0
  Filled 2022-05-11: qty 30, 30d supply, fill #1
  Filled 2022-06-08: qty 30, 30d supply, fill #2
  Filled 2022-07-08: qty 30, 30d supply, fill #3
  Filled 2022-08-03: qty 30, 30d supply, fill #4
  Filled 2022-11-02 (×2): qty 30, 30d supply, fill #5
  Filled 2022-12-02 (×2): qty 30, 30d supply, fill #6
  Filled 2023-01-20: qty 30, 30d supply, fill #7
  Filled 2023-02-04: qty 30, 30d supply, fill #8

## 2022-04-16 MED ORDER — SULFAMETHOXAZOLE-TRIMETHOPRIM 800-160 MG PO TABS
1.0000 | ORAL_TABLET | Freq: Every day | ORAL | 11 refills | Status: DC
  Filled 2022-04-16 – 2022-04-17 (×2): qty 30, 30d supply, fill #0
  Filled 2022-05-11: qty 30, 30d supply, fill #1
  Filled 2022-06-08: qty 30, 30d supply, fill #2

## 2022-04-16 MED ORDER — SYMTUZA 800-150-200-10 MG PO TABS
1.0000 | ORAL_TABLET | Freq: Every day | ORAL | 11 refills | Status: DC
  Filled 2022-04-16 – 2022-04-17 (×2): qty 30, 30d supply, fill #0
  Filled 2022-05-11: qty 30, 30d supply, fill #1
  Filled 2022-06-08: qty 30, 30d supply, fill #2
  Filled 2022-07-08: qty 30, 30d supply, fill #3
  Filled 2022-08-03: qty 30, 30d supply, fill #4
  Filled 2022-11-02: qty 30, 30d supply, fill #5

## 2022-04-17 ENCOUNTER — Other Ambulatory Visit (HOSPITAL_COMMUNITY): Payer: Self-pay

## 2022-04-17 ENCOUNTER — Other Ambulatory Visit: Payer: Self-pay

## 2022-04-20 ENCOUNTER — Telehealth: Payer: Self-pay

## 2022-04-20 NOTE — Telephone Encounter (Signed)
[facility] Patient Advocate Encounter  Patient's medications have been couriered to [facility] from [facility] and will be picked up .  [name], sulfa  [name] , [name] Patient [facility] for Infectious Disease Phone: [phone] Fax:  [phone]

## 2022-04-29 ENCOUNTER — Other Ambulatory Visit (HOSPITAL_COMMUNITY): Payer: Self-pay

## 2022-05-11 ENCOUNTER — Other Ambulatory Visit (HOSPITAL_COMMUNITY): Payer: Self-pay

## 2022-05-14 ENCOUNTER — Other Ambulatory Visit (HOSPITAL_COMMUNITY): Payer: Self-pay

## 2022-05-18 ENCOUNTER — Telehealth: Payer: Self-pay

## 2022-05-18 NOTE — Telephone Encounter (Signed)
[facility] Patient Advocate Encounter  Patient's medications have been couriered to [facility] from [facility] and will be picked up .  Symtuza, Tivicay, [place] , [place] Patient [facility] for Infectious Disease Phone: [phone] Fax:  [phone]

## 2022-06-08 ENCOUNTER — Other Ambulatory Visit: Payer: Self-pay

## 2022-06-08 ENCOUNTER — Other Ambulatory Visit (HOSPITAL_COMMUNITY): Payer: Self-pay

## 2022-06-10 ENCOUNTER — Other Ambulatory Visit (HOSPITAL_COMMUNITY): Payer: Self-pay

## 2022-06-23 ENCOUNTER — Other Ambulatory Visit: Payer: Self-pay

## 2022-06-23 ENCOUNTER — Ambulatory Visit (INDEPENDENT_AMBULATORY_CARE_PROVIDER_SITE_OTHER): Payer: 59 | Admitting: Internal Medicine

## 2022-06-23 ENCOUNTER — Encounter: Payer: Self-pay | Admitting: Internal Medicine

## 2022-06-23 VITALS — BP 136/85 | HR 88 | Resp 16 | Ht 68.0 in | Wt 189.0 lb

## 2022-06-23 DIAGNOSIS — J181 Lobar pneumonia, unspecified organism: Secondary | ICD-10-CM | POA: Diagnosis not present

## 2022-06-23 DIAGNOSIS — B20 Human immunodeficiency virus [HIV] disease: Secondary | ICD-10-CM

## 2022-06-23 DIAGNOSIS — E785 Hyperlipidemia, unspecified: Secondary | ICD-10-CM | POA: Diagnosis not present

## 2022-06-23 MED ORDER — PREDNISONE 20 MG PO TABS: 20.0000 mg | ORAL_TABLET | Freq: Every day | ORAL | 0 refills | Status: DC

## 2022-06-23 MED ORDER — TRIAMCINOLONE ACETONIDE 0.1 % EX OINT: 1.0000 | TOPICAL_OINTMENT | Freq: Two times a day (BID) | CUTANEOUS | 1 refills | Status: DC

## 2022-06-23 MED ORDER — ROSUVASTATIN CALCIUM 20 MG PO TABS: 20.0000 mg | ORAL_TABLET | Freq: Every day | ORAL | 11 refills | Status: DC

## 2022-06-23 MED ORDER — SULFAMETHOXAZOLE-TRIMETHOPRIM 800-160 MG PO TABS: 1.0000 | ORAL_TABLET | Freq: Every day | ORAL | 11 refills | Status: DC

## 2022-06-23 NOTE — Patient Instructions (Signed)
  Bactrim = take 1 pill twice a day x 7 days  Prednisone = take 1 pill daily x 5 days, followed by half tablet for 4 days

## 2022-06-23 NOTE — Progress Notes (Signed)
RFV: follow up for hiv disease  Patient ID: [name], male   DOB: 03/11/1971, 52 y.o.   MRN: [id number]  HPI  [name] is a 52yo M with advanced- poorly controlled hiv disease, on tivicay-symtuza and bactrim for oi proph. CD 4 count of 75(8%)/VL of 87 (in January 2024). Has both poor health literacy and language barrier that at ties has been difficult to keep him engaged in care and virologic controlled  ROS: eczema is back, ran out of cream   Outpatient Encounter Medications as of 06/23/2022  Medication Sig   aspirin EC 81 MG tablet Take 1 tablet (81 mg total) by mouth daily. Swallow whole.   Darunavir-Cobicistat-Emtricitabine-Tenofovir Alafenamide (SYMTUZA) 800-150-200-10 MG TABS Take 1 tablet by mouth daily with breakfast.   dolutegravir (TIVICAY) 50 MG tablet Take 1 tablet (50 mg total) by mouth daily.   ibuprofen (ADVIL) 200 MG tablet Take 400 mg by mouth every 6 (six) hours as needed for mild pain, fever or headache.   rosuvastatin (CRESTOR) 20 MG tablet Take 1 tablet (20 mg total) by mouth daily.   sulfamethoxazole-trimethoprim (BACTRIM DS) 800-160 MG tablet Take 1 tablet by mouth daily.   triamcinolone 0.1%-Aquaphor equivlanet 1:1 ointment mixture Apply topically 2 (two) times daily.   vitamin D3 (CHOLECALCIFEROL) 25 MCG tablet Take 1 tablet (1,000 Units total) by mouth daily.   cetirizine (ZYRTEC) 5 MG tablet Take 1 tablet (5 mg total) by mouth daily.   guaiFENesin 200 MG/10ML LIQD Take 5 mLs by mouth every 4 (four) hours as needed for cough or to loosen phlegm. (Patient not taking: Reported on 03/17/2022)   guaiFENesin-dextromethorphan (ROBITUSSIN DM) 100-10 MG/5ML syrup Take 5 mLs by mouth every 4 (four) hours as needed for cough. (Patient not taking: Reported on 06/23/2022)   umeclidinium-vilanterol (ANORO ELLIPTA) 62.5-25 MCG/ACT AEPB Inhale 1 puff into the lungs daily. (Patient not taking: Reported on 12/15/2021)   No facility-administered encounter medications on file  as of 06/23/2022.     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   CKD (chronic kidney disease) stage 2, GFR 60-89 ml/min 11/01/2020   Type 2 diabetes mellitus 05/17/2019   Genital warts 05/06/2018   Pulmonary emphysema    Condyloma 01/04/2017   Chronic systolic CHF (congestive heart failure) 04/24/2016   Normochromic normocytic anemia 02/25/2016   Pneumonia 02/25/2016   AIDS (acquired immune deficiency syndrome)    S/P ORIF (open reduction internal fixation) fracture 03/21/2014   HIV disease 09/18/2013     Health Maintenance Due  Topic Date Due   FOOT EXAM  Never done   OPHTHALMOLOGY EXAM  Never done   Diabetic kidney evaluation - Urine ACR  11/02/2021   HEMOGLOBIN A1C  05/18/2022     Review of Systems Review of Systems  Constitutional: Negative for fever, chills, diaphoresis, activity change, appetite change, fatigue and unexpected weight change.  HENT: Negative for congestion, sore throat, rhinorrhea, sneezing, trouble swallowing and sinus pressure.  Eyes: Negative for photophobia and visual disturbance.  Respiratory: Negative for cough, chest  tightness, shortness of breath, wheezing and stridor.  Cardiovascular: Negative for chest pain, palpitations and leg swelling.  Gastrointestinal: Negative for nausea, vomiting, abdominal pain, diarrhea, constipation, blood in stool, abdominal distention and anal bleeding.  Genitourinary: Negative for dysuria, hematuria, flank pain  and difficulty urinating.  Musculoskeletal: Negative for myalgias, back pain, joint swelling, arthralgias and gait problem.  Skin: Negative for color change, pallor, rash and wound.  Neurological: Negative for dizziness, tremors, weakness and light-headedness.  Hematological: Negative for adenopathy. Does not bruise/bleed easily.  Psychiatric/Behavioral: Negative for behavioral problems, confusion, sleep disturbance, dysphoric mood, decreased concentration and agitation.   Physical Exam  BP 136/85   Pulse 88   Resp 16   Ht 5\' 8"  (1.727 m)   Wt 189 lb (85.7 kg)   SpO2 97%   BMI 28.74 kg/m  Physical Exam  Constitutional: He is oriented to person, place, and time. He appears well-developed and well-nourished. No distress.  HENT:  Mouth/Throat: Oropharynx is clear and moist. No oropharyngeal exudate.  Cardiovascular: Normal rate, regular rhythm and normal heart sounds. Exam reveals no gallop and no friction rub.  No murmur heard.  Pulmonary/Chest: Effort normal and breath sounds normal. No respiratory distress. He has no wheezes.  Abdominal: Soft. Bowel sounds are normal. He exhibits no distension. There is no tenderness.  Lymphadenopathy:  He has no cervical adenopathy.  Neurological: He is alert and oriented to person, place, and time.  Skin: Skin is warm and dry. No rash noted. No erythema.  Psychiatric: He has a normal mood and affect. His behavior is normal.   Lab Results  Component Value Date   CD4TCELL 8 (L) 03/17/2022   Lab Results  Component Value Date   CD4TABS 75 (L) 03/17/2022   CD4TABS 60 (L) 12/01/2021   CD4TABS 95 (L) 11/18/2021   Lab Results  Component Value Date   HIV1RNAQUANT 87 (H) 03/17/2022   No results found for: "HEPBSAB" Lab Results  Component Value Date   LABRPR NON-REACTIVE 03/17/2022    CBC Lab Results  Component Value Date   WBC 3.7 (L) 03/17/2022   RBC 4.40 03/17/2022   HGB 14.2 03/17/2022   HCT 41.9 03/17/2022   PLT 266 03/17/2022    MCV 95.2 03/17/2022   MCH 32.3 03/17/2022   MCHC 33.9 03/17/2022   RDW 11.8 03/17/2022   LYMPHSABS 1,117 03/17/2022   MONOABS 0.7 11/17/2021   EOSABS 163 03/17/2022    BMET Lab Results  Component Value Date   NA 136 03/17/2022   K 3.7 03/17/2022   CL 96 (L) 03/17/2022   CO2 31 03/17/2022   GLUCOSE 158 (H) 03/17/2022   BUN 17 03/17/2022   CREATININE 1.22 03/17/2022   CALCIUM 9.0 03/17/2022   GFRNONAA >60 11/18/2021   GFRAA 79 12/04/2019      Assessment and Plan Hiv = labs today Counseling on taking bactrim for oi proph as well as   Cxr: will treat with bactrim DS TID for presumed PCP plus steroid taper  x 7 days. Will see back in 7 days to reassess patient  Health maintenance = take crestor for primary prevention

## 2022-06-24 LAB — T-HELPER CELL (CD4) - (RCID CLINIC ONLY)
CD4 % Helper T Cell: 11 % — ABNORMAL LOW (ref 33–65)
CD4 T Cell Abs: 93 /uL — ABNORMAL LOW (ref 400–1790)

## 2022-06-27 ENCOUNTER — Emergency Department (HOSPITAL_COMMUNITY): Payer: 59

## 2022-06-27 ENCOUNTER — Inpatient Hospital Stay (HOSPITAL_COMMUNITY)
Admission: EM | Admit: 2022-06-27 | Discharge: 2022-07-02 | DRG: 974 | Disposition: A | Discharge status: Home / Self Care | Payer: 59 | Attending: Internal Medicine | Admitting: Internal Medicine

## 2022-06-27 ENCOUNTER — Encounter (HOSPITAL_COMMUNITY): Payer: Self-pay

## 2022-06-27 DIAGNOSIS — Z1152 Encounter for screening for COVID-19: Secondary | ICD-10-CM

## 2022-06-27 DIAGNOSIS — J439 Emphysema, unspecified: Secondary | ICD-10-CM | POA: Diagnosis present

## 2022-06-27 DIAGNOSIS — E1169 Type 2 diabetes mellitus with other specified complication: Secondary | ICD-10-CM

## 2022-06-27 DIAGNOSIS — Z7982 Long term (current) use of aspirin: Secondary | ICD-10-CM

## 2022-06-27 DIAGNOSIS — J9601 Acute respiratory failure with hypoxia: Secondary | ICD-10-CM | POA: Diagnosis present

## 2022-06-27 DIAGNOSIS — N182 Chronic kidney disease, stage 2 (mild): Secondary | ICD-10-CM | POA: Diagnosis present

## 2022-06-27 DIAGNOSIS — J1289 Other viral pneumonia: Principal | ICD-10-CM | POA: Diagnosis present

## 2022-06-27 DIAGNOSIS — B20 Human immunodeficiency virus [HIV] disease: Secondary | ICD-10-CM | POA: Diagnosis present

## 2022-06-27 DIAGNOSIS — J470 Bronchiectasis with acute lower respiratory infection: Secondary | ICD-10-CM | POA: Diagnosis present

## 2022-06-27 DIAGNOSIS — E860 Dehydration: Secondary | ICD-10-CM | POA: Diagnosis present

## 2022-06-27 DIAGNOSIS — Z86711 Personal history of pulmonary embolism: Secondary | ICD-10-CM

## 2022-06-27 DIAGNOSIS — E119 Type 2 diabetes mellitus without complications: Secondary | ICD-10-CM

## 2022-06-27 DIAGNOSIS — J440 Chronic obstructive pulmonary disease with acute lower respiratory infection: Secondary | ICD-10-CM | POA: Diagnosis present

## 2022-06-27 DIAGNOSIS — Z603 Acculturation difficulty: Secondary | ICD-10-CM | POA: Diagnosis present

## 2022-06-27 DIAGNOSIS — J189 Pneumonia, unspecified organism: Secondary | ICD-10-CM | POA: Diagnosis present

## 2022-06-27 DIAGNOSIS — Z8249 Family history of ischemic heart disease and other diseases of the circulatory system: Secondary | ICD-10-CM

## 2022-06-27 DIAGNOSIS — I5022 Chronic systolic (congestive) heart failure: Secondary | ICD-10-CM | POA: Diagnosis present

## 2022-06-27 DIAGNOSIS — Z7951 Long term (current) use of inhaled steroids: Secondary | ICD-10-CM

## 2022-06-27 DIAGNOSIS — E1165 Type 2 diabetes mellitus with hyperglycemia: Secondary | ICD-10-CM | POA: Diagnosis present

## 2022-06-27 DIAGNOSIS — Z888 Allergy status to other drugs, medicaments and biological substances status: Secondary | ICD-10-CM

## 2022-06-27 DIAGNOSIS — B9789 Other viral agents as the cause of diseases classified elsewhere: Secondary | ICD-10-CM | POA: Diagnosis present

## 2022-06-27 DIAGNOSIS — Z8611 Personal history of tuberculosis: Secondary | ICD-10-CM

## 2022-06-27 DIAGNOSIS — N179 Acute kidney failure, unspecified: Secondary | ICD-10-CM | POA: Diagnosis present

## 2022-06-27 DIAGNOSIS — Z86718 Personal history of other venous thrombosis and embolism: Secondary | ICD-10-CM

## 2022-06-27 DIAGNOSIS — Z933 Colostomy status: Secondary | ICD-10-CM

## 2022-06-27 DIAGNOSIS — Z79899 Other long term (current) drug therapy: Secondary | ICD-10-CM

## 2022-06-27 DIAGNOSIS — J441 Chronic obstructive pulmonary disease with (acute) exacerbation: Secondary | ICD-10-CM | POA: Diagnosis present

## 2022-06-27 DIAGNOSIS — E1122 Type 2 diabetes mellitus with diabetic chronic kidney disease: Secondary | ICD-10-CM | POA: Diagnosis present

## 2022-06-27 LAB — CBG MONITORING, ED
Glucose-Capillary: 135 mg/dL — ABNORMAL HIGH (ref 70–99)
Glucose-Capillary: 184 mg/dL — ABNORMAL HIGH (ref 70–99)

## 2022-06-27 LAB — RESPIRATORY PANEL BY PCR

## 2022-06-27 LAB — CBC WITH DIFFERENTIAL/PLATELET
Absolute Monocytes: 420 cells/uL (ref 200–950)
Basophils Absolute: 12 cells/uL (ref 0–200)
Basophils Relative: 0.3 %
Eosinophils Absolute: 480 cells/uL (ref 15–500)
Eosinophils Relative: 12 %
HCT: 41.2 % (ref 38.5–50.0)
Hemoglobin: 13.8 g/dL (ref 13.2–17.1)
Lymphs Abs: 832 cells/uL — ABNORMAL LOW (ref 850–3900)
MCH: 32.4 pg (ref 27.0–33.0)
MCHC: 33.5 g/dL (ref 32.0–36.0)
MCV: 96.7 fL (ref 80.0–100.0)
MPV: 9.9 fL (ref 7.5–12.5)
Monocytes Relative: 10.5 %
Neutro Abs: 2256 cells/uL (ref 1500–7800)
Neutrophils Relative %: 56.4 %
Platelets: 218 10*3/uL (ref 140–400)
RBC: 4.26 10*6/uL (ref 4.20–5.80)
RDW: 12.2 % (ref 11.0–15.0)
Total Lymphocyte: 20.8 %
WBC: 4 10*3/uL (ref 3.8–10.8)

## 2022-06-27 LAB — URINALYSIS, ROUTINE W REFLEX MICROSCOPIC
Bacteria, UA: NONE SEEN
Bilirubin Urine: NEGATIVE
Glucose, UA: NEGATIVE mg/dL
Hgb urine dipstick: NEGATIVE
Ketones, ur: NEGATIVE mg/dL
Leukocytes,Ua: NEGATIVE
Nitrite: NEGATIVE
Protein, ur: 30 mg/dL — AB
Specific Gravity, Urine: 1.023 (ref 1.005–1.030)
pH: 5 (ref 5.0–8.0)

## 2022-06-27 LAB — COMPLETE METABOLIC PANEL WITH GFR
AG Ratio: 0.9 (calc) — ABNORMAL LOW (ref 1.0–2.5)
ALT: 7 U/L — ABNORMAL LOW (ref 9–46)
AST: 14 U/L (ref 10–35)
Albumin: 3.5 g/dL — ABNORMAL LOW (ref 3.6–5.1)
Alkaline phosphatase (APISO): 79 U/L (ref 35–144)
BUN/Creatinine Ratio: 12 (calc) (ref 6–22)
BUN: 17 mg/dL (ref 7–25)
CO2: 34 mmol/L — ABNORMAL HIGH (ref 20–32)
Calcium: 8.9 mg/dL (ref 8.6–10.3)
Chloride: 100 mmol/L (ref 98–110)
Creat: 1.45 mg/dL — ABNORMAL HIGH (ref 0.70–1.30)
Globulin: 3.8 g/dL (calc) — ABNORMAL HIGH (ref 1.9–3.7)
Glucose, Bld: 163 mg/dL — ABNORMAL HIGH (ref 65–99)
Potassium: 3.8 mmol/L (ref 3.5–5.3)
Sodium: 140 mmol/L (ref 135–146)
Total Bilirubin: 0.5 mg/dL (ref 0.2–1.2)
Total Protein: 7.3 g/dL (ref 6.1–8.1)
eGFR: 58 mL/min/{1.73_m2} — ABNORMAL LOW (ref >=60)

## 2022-06-27 LAB — CBC
HCT: 40 % (ref 39.0–52.0)
Hemoglobin: 12.7 g/dL — ABNORMAL LOW (ref 13.0–17.0)
MCH: 32.6 pg (ref 26.0–34.0)
MCHC: 31.8 g/dL (ref 30.0–36.0)
MCV: 102.8 fL — ABNORMAL HIGH (ref 80.0–100.0)
Platelets: 191 10*3/uL (ref 150–400)
RBC: 3.89 MIL/uL — ABNORMAL LOW (ref 4.22–5.81)
RDW: 13.1 % (ref 11.5–15.5)
WBC: 6.6 10*3/uL (ref 4.0–10.5)
nRBC: 0 % (ref 0.0–0.2)

## 2022-06-27 LAB — GLUCOSE, CAPILLARY
Glucose-Capillary: 213 mg/dL — ABNORMAL HIGH (ref 70–99)
Glucose-Capillary: 105 mg/dL — ABNORMAL HIGH (ref 70–99)

## 2022-06-27 LAB — RESP PANEL BY RT-PCR (RSV, FLU A&B, COVID)  RVPGX2
Influenza A by PCR: NEGATIVE
Influenza B by PCR: NEGATIVE
Resp Syncytial Virus by PCR: NEGATIVE
SARS Coronavirus 2 by RT PCR: NEGATIVE

## 2022-06-27 LAB — COMPREHENSIVE METABOLIC PANEL
ALT: 19 U/L (ref 0–44)
AST: 20 U/L (ref 15–41)
Albumin: 3.1 g/dL — ABNORMAL LOW (ref 3.5–5.0)
Alkaline Phosphatase: 80 U/L (ref 38–126)
Anion gap: 10 (ref 5–15)
BUN: 26 mg/dL — ABNORMAL HIGH (ref 6–20)
CO2: 25 mmol/L (ref 22–32)
Calcium: 9.1 mg/dL (ref 8.9–10.3)
Chloride: 101 mmol/L (ref 98–111)
Creatinine, Ser: 1.57 mg/dL — ABNORMAL HIGH (ref 0.61–1.24)
GFR, Estimated: 53 mL/min — ABNORMAL LOW (ref >60)
Glucose, Bld: 230 mg/dL — ABNORMAL HIGH (ref 70–99)
Potassium: 3.9 mmol/L (ref 3.5–5.1)
Sodium: 136 mmol/L (ref 135–145)
Total Bilirubin: 0.4 mg/dL (ref 0.3–1.2)
Total Protein: 7.2 g/dL (ref 6.5–8.1)

## 2022-06-27 LAB — CULTURE, RESPIRATORY W GRAM STAIN

## 2022-06-27 LAB — EXPECTORATED SPUTUM ASSESSMENT W GRAM STAIN, RFLX TO RESP C

## 2022-06-27 LAB — TROPONIN I (HIGH SENSITIVITY): Troponin I (High Sensitivity): 7 ng/L (ref <18)

## 2022-06-27 LAB — RPR: RPR Ser Ql: NONREACTIVE

## 2022-06-27 LAB — HIV-1 RNA QUANT-NO REFLEX-BLD
HIV 1 RNA Quant: 104 Copies/mL — ABNORMAL HIGH
HIV-1 RNA Quant, Log: 2.02 Log cps/mL — ABNORMAL HIGH

## 2022-06-27 LAB — MRSA NEXT GEN BY PCR, NASAL: MRSA by PCR Next Gen: NOT DETECTED

## 2022-06-27 LAB — STREP PNEUMONIAE URINARY ANTIGEN: Strep Pneumo Urinary Antigen: NEGATIVE

## 2022-06-27 LAB — BRAIN NATRIURETIC PEPTIDE: B Natriuretic Peptide: 80.5 pg/mL (ref 0.0–100.0)

## 2022-06-27 LAB — CULTURE, BLOOD (SINGLE): Special Requests: ADEQUATE

## 2022-06-27 MED ORDER — IPRATROPIUM-ALBUTEROL 0.5-2.5 (3) MG/3ML IN SOLN
3.0000 mL | Freq: Three times a day (TID) | RESPIRATORY_TRACT | Status: DC
  Administered 2022-06-28 – 2022-07-02 (×13): 3 mL via RESPIRATORY_TRACT
  Filled 2022-06-27 (×14): qty 3

## 2022-06-27 MED ORDER — ACETAMINOPHEN 325 MG PO TABS
650.0000 mg | ORAL_TABLET | Freq: Four times a day (QID) | ORAL | Status: DC | PRN
  Administered 2022-06-28 – 2022-07-02 (×3): 650 mg via ORAL
  Filled 2022-06-27 (×3): qty 2

## 2022-06-27 MED ORDER — INSULIN ASPART 100 UNIT/ML IJ SOLN
0.0000 [IU] | Freq: Three times a day (TID) | INTRAMUSCULAR | Status: DC
  Administered 2022-06-27: 5 [IU] via SUBCUTANEOUS
  Administered 2022-06-27: 2 [IU] via SUBCUTANEOUS
  Administered 2022-06-27 – 2022-06-28 (×3): 3 [IU] via SUBCUTANEOUS
  Administered 2022-06-28: 1 [IU] via SUBCUTANEOUS
  Administered 2022-06-29: 5 [IU] via SUBCUTANEOUS
  Administered 2022-06-29: 8 [IU] via SUBCUTANEOUS
  Administered 2022-06-30: 3 [IU] via SUBCUTANEOUS
  Administered 2022-06-30: 2 [IU] via SUBCUTANEOUS
  Administered 2022-06-30: 15 [IU] via SUBCUTANEOUS
  Administered 2022-07-01: 8 [IU] via SUBCUTANEOUS
  Administered 2022-07-01 (×2): 3 [IU] via SUBCUTANEOUS
  Administered 2022-07-02: 2 [IU] via SUBCUTANEOUS

## 2022-06-27 MED ORDER — DOLUTEGRAVIR SODIUM 50 MG PO TABS
50.0000 mg | ORAL_TABLET | Freq: Every day | ORAL | Status: DC
  Administered 2022-06-27 – 2022-07-02 (×6): 50 mg via ORAL
  Filled 2022-06-27 (×7): qty 1

## 2022-06-27 MED ORDER — IPRATROPIUM-ALBUTEROL 0.5-2.5 (3) MG/3ML IN SOLN
RESPIRATORY_TRACT | Status: AC
  Administered 2022-06-27: 3 mL via RESPIRATORY_TRACT
  Filled 2022-06-27: qty 6

## 2022-06-27 MED ORDER — IPRATROPIUM-ALBUTEROL 0.5-2.5 (3) MG/3ML IN SOLN
3.0000 mL | RESPIRATORY_TRACT | Status: DC
  Administered 2022-06-27 (×3): 3 mL via RESPIRATORY_TRACT
  Filled 2022-06-27 (×3): qty 3

## 2022-06-27 MED ORDER — LIDOCAINE 5 % EX PTCH
2.0000 | MEDICATED_PATCH | CUTANEOUS | Status: DC
  Administered 2022-06-27 – 2022-07-01 (×5): 2 via TRANSDERMAL
  Filled 2022-06-27 (×6): qty 2

## 2022-06-27 MED ORDER — IPRATROPIUM-ALBUTEROL 0.5-2.5 (3) MG/3ML IN SOLN
3.0000 mL | Freq: Once | RESPIRATORY_TRACT | Status: DC
  Filled 2022-06-27: qty 3

## 2022-06-27 MED ORDER — UMECLIDINIUM-VILANTEROL 62.5-25 MCG/ACT IN AEPB
1.0000 | INHALATION_SPRAY | Freq: Every day | RESPIRATORY_TRACT | Status: DC
  Administered 2022-06-28 – 2022-07-02 (×5): 1 via RESPIRATORY_TRACT
  Filled 2022-06-27: qty 14

## 2022-06-27 MED ORDER — ROSUVASTATIN CALCIUM 20 MG PO TABS
20.0000 mg | ORAL_TABLET | Freq: Every day | ORAL | Status: DC
  Administered 2022-06-27 – 2022-07-02 (×6): 20 mg via ORAL
  Filled 2022-06-27 (×6): qty 1

## 2022-06-27 MED ORDER — SODIUM CHLORIDE 0.9 % IV SOLN
1.0000 g | INTRAVENOUS | Status: DC
  Administered 2022-06-28 – 2022-06-29 (×2): 1 g via INTRAVENOUS
  Filled 2022-06-27 (×2): qty 10

## 2022-06-27 MED ORDER — SULFAMETHOXAZOLE-TRIMETHOPRIM 800-160 MG PO TABS
1.0000 | ORAL_TABLET | Freq: Every day | ORAL | Status: DC
  Administered 2022-06-27 – 2022-07-02 (×6): 1 via ORAL
  Filled 2022-06-27 (×6): qty 1

## 2022-06-27 MED ORDER — AZITHROMYCIN 500 MG PO TABS
500.0000 mg | ORAL_TABLET | Freq: Every day | ORAL | Status: DC
  Administered 2022-06-28 – 2022-06-29 (×2): 500 mg via ORAL
  Filled 2022-06-27 (×2): qty 2

## 2022-06-27 MED ORDER — ACETAMINOPHEN 500 MG PO TABS
1000.0000 mg | ORAL_TABLET | ORAL | Status: AC
  Administered 2022-06-27: 1000 mg via ORAL
  Filled 2022-06-27: qty 2

## 2022-06-27 MED ORDER — AZITHROMYCIN 250 MG PO TABS
500.0000 mg | ORAL_TABLET | Freq: Once | ORAL | Status: AC
  Administered 2022-06-27: 500 mg via ORAL
  Filled 2022-06-27: qty 2

## 2022-06-27 MED ORDER — SODIUM CHLORIDE 0.9 % IV SOLN
1.0000 g | Freq: Once | INTRAVENOUS | Status: AC
  Administered 2022-06-27: 1 g via INTRAVENOUS
  Filled 2022-06-27: qty 10

## 2022-06-27 MED ORDER — IPRATROPIUM-ALBUTEROL 0.5-2.5 (3) MG/3ML IN SOLN
3.0000 mL | Freq: Once | RESPIRATORY_TRACT | Status: AC
  Filled 2022-06-27: qty 3

## 2022-06-27 MED ORDER — ENOXAPARIN SODIUM 40 MG/0.4ML IJ SOSY
40.0000 mg | PREFILLED_SYRINGE | INTRAMUSCULAR | Status: DC
  Administered 2022-06-27 – 2022-07-01 (×5): 40 mg via SUBCUTANEOUS
  Filled 2022-06-27 (×5): qty 0.4

## 2022-06-27 MED ORDER — IPRATROPIUM-ALBUTEROL 0.5-2.5 (3) MG/3ML IN SOLN: 3.0000 mL | RESPIRATORY_TRACT | Status: DC | PRN

## 2022-06-27 MED ORDER — ACETAMINOPHEN 650 MG RE SUPP: 650.0000 mg | Freq: Four times a day (QID) | RECTAL | Status: DC | PRN

## 2022-06-27 MED ORDER — IPRATROPIUM-ALBUTEROL 0.5-2.5 (3) MG/3ML IN SOLN
RESPIRATORY_TRACT | Status: AC
  Filled 2022-06-27: qty 3

## 2022-06-27 MED ORDER — DARUN-COBIC-EMTRICIT-TENOFAF 800-150-200-10 MG PO TABS
1.0000 | ORAL_TABLET | Freq: Every day | ORAL | Status: DC
  Administered 2022-06-28 – 2022-07-02 (×5): 1 via ORAL
  Filled 2022-06-27 (×6): qty 1

## 2022-06-27 MED ORDER — GUAIFENESIN-DM 100-10 MG/5ML PO SYRP: 5.0000 mL | ORAL_SOLUTION | ORAL | Status: DC | PRN

## 2022-06-27 MED ORDER — PREDNISONE 20 MG PO TABS
40.0000 mg | ORAL_TABLET | Freq: Every day | ORAL | Status: AC
  Administered 2022-06-27 – 2022-07-01 (×5): 40 mg via ORAL
  Filled 2022-06-27 (×5): qty 2

## 2022-06-27 MED ORDER — ASPIRIN 81 MG PO TBEC
81.0000 mg | DELAYED_RELEASE_TABLET | Freq: Every day | ORAL | Status: DC
  Administered 2022-06-27 – 2022-07-02 (×6): 81 mg via ORAL
  Filled 2022-06-27 (×6): qty 1

## 2022-06-27 MED ORDER — VITAMIN D 25 MCG (1000 UNIT) PO TABS
1000.0000 [IU] | ORAL_TABLET | Freq: Every day | ORAL | Status: DC
  Administered 2022-06-27 – 2022-07-02 (×6): 1000 [IU] via ORAL
  Filled 2022-06-27 (×6): qty 1

## 2022-06-27 NOTE — ED Notes (Signed)
ED TO INPATIENT HANDOFF REPORT  ED Nurse Name and Phone #:  Arline Asp, RN [phone]  S Name/Age/Gender [name] 52 y.o. male Room/Bed: 002C/002C  Code Status   Code Status: Full Code  Home/SNF/Other Home Patient oriented to: self, place, time, and situation Is this baseline? Yes   Triage Complete: Triage complete  Chief Complaint CAP (community acquired pneumonia) [J18.9]  Triage Note No notes on file   Allergies Allergies  Allergen Reactions   Metformin And Related Swelling and Rash    Pt doesn't remember this allergy, but does state he gets rash. Unsure if related to medication.    Level of Care/Admitting Diagnosis ED Disposition     ED Disposition  Admit   Condition  --   Comment  Hospital Area: [facility] [[id number]]  Level of Care: Telemetry Medical [[id number]]  May place patient in observation at [facility] or [facility] if equivalent level of care is available:: No  Covid Evaluation: Confirmed COVID Negative  Diagnosis: CAP (community acquired pneumonia) [454098]  Admitting Physician: Miguel Aschoff [[id number]]  Attending Physician: Miguel Aschoff [[id number]]          B Medical/Surgery History Past Medical History:  Diagnosis Date   Acute pulmonary embolus 02/24/2016   Dx December 2017 taking Eliquis  BID   Bronchiectasis with acute exacerbation    Depression    "stress and depression for any man is common" (03/21/2014)   Diabetes mellitus without complication    DVT (deep venous thrombosis) 11/03/2020   Dyspnea    Genital warts 01/04/2017   Hepatitis    "I don't know what hepatitis I have"   HIV disease    MSSA bacteremia    Pneumonia due to pneumocystis jiroveci 04/24/2016   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    TB (pulmonary tuberculosis)    previously treated according to refugee documentation   Past Surgical History:  Procedure Laterality Date   BRONCHIAL WASHINGS Bilateral 04/23/2021    Procedure: BRONCHIAL WASHINGS;  Surgeon: Lorin Glass, MD;  Location: [facility] ENDOSCOPY;  Service: Pulmonary;  Laterality: Bilateral;   FRACTURE SURGERY     IM NAILING TIBIA Right 03/21/2014   ORIF ANKLE FRACTURE Right 03/21/2014   lateral malleolus/notes 03/21/2014   ORIF ANKLE FRACTURE Right 03/21/2014   Procedure: OPEN REDUCTION INTERNAL FIXATION (ORIF) pilon ;  Surgeon: Eldred Manges, MD;  Location: [facility] OR;  Service: Orthopedics;  Laterality: Right;   TEE WITHOUT CARDIOVERSION N/A 05/23/2019   Procedure: TRANSESOPHAGEAL ECHOCARDIOGRAM (TEE);  Surgeon: Chilton Si, MD;  Location: [facility] ENDOSCOPY;  Service: Cardiovascular;  Laterality: N/A;   TIBIA IM NAIL INSERTION Right 03/21/2014   Procedure: INTRAMEDULLARY (IM) NAIL TIBIAL;  Surgeon: Eldred Manges, MD;  Location: [facility] OR;  Service: Orthopedics;  Laterality: Right;   VIDEO BRONCHOSCOPY Bilateral 03/02/2016   Procedure: VIDEO BRONCHOSCOPY WITHOUT FLUORO;  Surgeon: Roslynn Amble, MD;  Location: [facility] ENDOSCOPY;  Service: Cardiopulmonary;  Laterality: Bilateral;   VIDEO BRONCHOSCOPY Left 04/23/2021   Procedure: VIDEO BRONCHOSCOPY WITHOUT FLUORO;  Surgeon: Lorin Glass, MD;  Location: [facility] ENDOSCOPY;  Service: Pulmonary;  Laterality: Left;     A IV Location/Drains/Wounds Patient Lines/Drains/Airways Status     Active Line/Drains/Airways     Name Placement date Placement time Site Days   Peripheral IV 06/27/22 18 G Anterior;Left Forearm 06/27/22  0153  Forearm  less than 1   Peripheral IV 06/27/22 Anterior;Right Hand 06/27/22  0401  Hand  less than  1   Colostomy LLQ --  --  LLQ  --   Wound / Incision (Open or Dehisced) 11/11/18 11/11/18  0900  --  1324            Intake/Output Last 24 hours  Intake/Output Summary (Last 24 hours) at 06/27/2022 0948 Last data filed at 06/27/2022 6295 Gross per 24 hour  Intake 95.82 ml  Output --  Net 95.82 ml    Labs/Imaging Results for orders placed or performed during the hospital encounter of 06/27/22  (from the past 48 hour(s))  Resp panel by RT-PCR (RSV, Flu A&B, Covid)     Status: None   Collection Time: 06/27/22  2:55 AM   Specimen: Nasal Swab  Result Value Ref Range   SARS Coronavirus 2 by RT PCR NEGATIVE NEGATIVE   Influenza A by PCR NEGATIVE NEGATIVE   Influenza B by PCR NEGATIVE NEGATIVE    Comment: (NOTE) The Xpert Xpress SARS-CoV-2/FLU/RSV plus assay is intended as an aid in the diagnosis of influenza from Nasopharyngeal swab specimens and should not be used as a sole basis for treatment. Nasal washings and aspirates are unacceptable for Xpert Xpress SARS-CoV-2/FLU/RSV testing.  Fact Sheet for Patients: BloggerCourse.com  Fact Sheet for Healthcare Providers: SeriousBroker.it  This test is not yet approved or cleared by the [place] FDA and has been authorized for detection and/or diagnosis of SARS-CoV-2 by FDA under an Emergency Use Authorization (EUA). This EUA will remain in effect (meaning this test can be used) for the duration of the COVID-19 declaration under Section 564(b)(1) of the Act, 21 U.S.C. section 360bbb-3(b)(1), unless the authorization is terminated or revoked.     Resp Syncytial Virus by PCR NEGATIVE NEGATIVE    Comment: (NOTE) Fact Sheet for Patients: BloggerCourse.com  Fact Sheet for Healthcare Providers: SeriousBroker.it  This test is not yet approved or cleared by the [place] FDA and has been authorized for detection and/or diagnosis of SARS-CoV-2 by FDA under an Emergency Use Authorization (EUA). This EUA will remain in effect (meaning this test can be used) for the duration of the COVID-19 declaration under Section 564(b)(1) of the Act, 21 U.S.C. section 360bbb-3(b)(1), unless the authorization is terminated or revoked.  Performed at [facility], [address] [place], [place] [address]   CBC     Status: Abnormal    Collection Time: 06/27/22  2:55 AM  Result Value Ref Range   WBC 6.6 4.0 - 10.5 K/uL   RBC 3.89 (L) 4.22 - 5.81 MIL/uL   Hemoglobin 12.7 (L) 13.0 - 17.0 g/dL   HCT 24.4 01.0 - 27.2 %   MCV 102.8 (H) 80.0 - 100.0 fL   MCH 32.6 26.0 - 34.0 pg   MCHC 31.8 30.0 - 36.0 g/dL   RDW 53.6 64.4 - 03.4 %   Platelets 191 150 - 400 K/uL   nRBC 0.0 0.0 - 0.2 %    Comment: Performed at [facility], [address], [place], [place] [address]  Comprehensive metabolic panel     Status: Abnormal   Collection Time: 06/27/22  2:55 AM  Result Value Ref Range   Sodium 136 135 - 145 mmol/L   Potassium 3.9 3.5 - 5.1 mmol/L   Chloride 101 98 - 111 mmol/L   CO2 25 22 - 32 mmol/L   Glucose, Bld 230 (H) 70 - 99 mg/dL    Comment: Glucose reference range applies only to samples taken after fasting for at least 8 hours.  BUN 26 (H) 6 - 20 mg/dL   Creatinine, Ser 4.09 (H) 0.61 - 1.24 mg/dL   Calcium 9.1 8.9 - 81.1 mg/dL   Total Protein 7.2 6.5 - 8.1 g/dL   Albumin 3.1 (L) 3.5 - 5.0 g/dL   AST 20 15 - 41 U/L   ALT 19 0 - 44 U/L   Alkaline Phosphatase 80 38 - 126 U/L   Total Bilirubin 0.4 0.3 - 1.2 mg/dL   GFR, Estimated 53 (L) >60 mL/min    Comment: (NOTE) Calculated using the CKD-EPI Creatinine Equation (2021)    Anion gap 10 5 - 15    Comment: Performed at [facility], [address], [place], [place] [address]  Culture, blood (single)     Status: None (Preliminary result)   Collection Time: 06/27/22  2:55 AM   Specimen: BLOOD  Result Value Ref Range   Specimen Description BLOOD SITE NOT SPECIFIED    Special Requests      BOTTLES DRAWN AEROBIC AND ANAEROBIC Blood Culture adequate volume   Culture      NO GROWTH < 12 HOURS Performed at [facility], [address], [place], [place] [address]    Report Status PENDING   Troponin I (High Sensitivity)     Status: None   Collection Time: 06/27/22  2:55 AM  Result Value Ref Range   Troponin I (High Sensitivity) 7 <18 ng/L     Comment: (NOTE) Elevated high sensitivity troponin I (hsTnI) values and significant  changes across serial measurements may suggest ACS but many other  chronic and acute conditions are known to elevate hsTnI results.  Refer to the "Links" section for chest pain algorithms and additional  guidance. Performed at [facility], [address] [place], [place] [address]   Brain natriuretic peptide     Status: None   Collection Time: 06/27/22  2:55 AM  Result Value Ref Range   B Natriuretic Peptide 80.5 0.0 - 100.0 pg/mL    Comment: Performed at [facility], [address] [place], [place] [address]  Culture, blood (single)     Status: None (Preliminary result)   Collection Time: 06/27/22  6:15 AM   Specimen: BLOOD  Result Value Ref Range   Specimen Description BLOOD SITE NOT SPECIFIED    Special Requests      BOTTLES DRAWN AEROBIC AND ANAEROBIC Blood Culture adequate volume   Culture      NO GROWTH <12 HOURS Performed at [facility], [address] [place], [place] [address]    Report Status PENDING   CBG monitoring, ED     Status: Abnormal   Collection Time: 06/27/22  7:49 AM  Result Value Ref Range   Glucose-Capillary 135 (H) 70 - 99 mg/dL    Comment: Glucose reference range applies only to samples taken after fasting for at least 8 hours.   DG Chest 2 View  Result Date: 06/27/2022 CLINICAL DATA:  Shortness of breath, cough. EXAM: CHEST - 2 VIEW COMPARISON:  11/15/2021. FINDINGS: The heart size and mediastinal contours are within normal limits. Emphysematous changes are present in the lungs. Multifocal bronchiectasis with scattered opacities are noted in the left lung. There is blunting of the left costophrenic angle, possible trace pleural effusion. Strandy atelectasis is present at the right lung base. No pneumothorax bilaterally. No acute osseous abnormality. IMPRESSION: 1. Multifocal bronchiectasis with scattered opacities in the left lung, concerning for  pneumonia. 2. Blunting of the  left costophrenic angle, possible trace left pleural effusion. 3. Emphysema. Electronically Signed   By: Thornell Sartorius M.D.   On: 06/27/2022 03:44    Pending Labs Unresulted Labs (From admission, onward)     Start     Ordered   06/28/22 0500  Basic metabolic panel  Tomorrow morning,   R        06/27/22 0618   06/28/22 0500  CBC  Tomorrow morning,   R        06/27/22 0618   06/28/22 0500  Hemoglobin A1c  Tomorrow morning,   R       Comments: To assess prior glycemic control    06/27/22 0654   06/27/22 0656  MRSA Next Gen by PCR, Nasal  Once,   R        06/27/22 0655   06/27/22 0656  Respiratory (~20 pathogens) panel by PCR  (Respiratory panel by PCR (~20 pathogens, ~24 hr TAT)  w precautions)  Once,   R        06/27/22 0655   06/27/22 0635  Urinalysis, Routine w reflex microscopic -Urine, Clean Catch  Once,   R       Question:  Specimen Source  Answer:  Urine, Clean Catch   06/27/22 0634   06/27/22 0620  Strep pneumoniae urinary antigen  Once,   R        06/27/22 0619   06/27/22 0603  Pneumocystis PCR  Once,   URGENT        06/27/22 0609   06/27/22 0603  Expectorated Sputum Assessment w Gram Stain, Rflx to Resp Cult  Once,   R       Question:  Patient immune status  Answer:  Immunocompromised   06/27/22 0609            Vitals/Pain Today's Vitals   06/27/22 0545 06/27/22 0630 06/27/22 0700 06/27/22 0716  BP: 122/68 115/70 106/65 111/68  Pulse: 87 96 88 87  Resp: 18 (!) Temp:    97.7 F (36.5 C)  TempSrc:    Oral  SpO2: 98% 93% 94% 96%  Weight:      Height:        Isolation Precautions Droplet precaution  Medications Medications  ipratropium-albuterol ([name]) 0.5-2.5 (3) MG/3ML nebulizer solution 3 mL ( Nebulization Canceled Entry 06/27/22 0425)  aspirin EC tablet 81 mg (has no administration in time range)  Darunavir-Cobicistat-Emtricitabine-Tenofovir Alafenamide (SYMTUZA) 800-150-200-10 MG TABS 1 tablet (1 tablet Oral Not  Given 06/27/22 0721)  dolutegravir (TIVICAY) tablet 50 mg (has no administration in time range)  sulfamethoxazole-trimethoprim (BACTRIM DS) 800-160 MG per tablet 1 tablet (has no administration in time range)  rosuvastatin (CRESTOR) tablet 20 mg (has no administration in time range)  cholecalciferol (VITAMIN D3) 25 MCG (1000 UNIT) tablet 1,000 Units (has no administration in time range)  umeclidinium-vilanterol (ANORO ELLIPTA) 62.5-25 MCG/ACT 1 puff (1 puff Inhalation Not Given 06/27/22 0721)  enoxaparin (LOVENOX) injection 40 mg (has no administration in time range)  acetaminophen (TYLENOL) tablet 650 mg (has no administration in time range)    Or  acetaminophen (TYLENOL) suppository 650 mg (has no administration in time range)  guaiFENesin-dextromethorphan (ROBITUSSIN DM) 100-10 MG/5ML syrup 5 mL (has no administration in time range)  predniSONE (DELTASONE) tablet 40 mg (40 mg Oral Given 06/27/22 0720)  ipratropium-albuterol ([name]) 0.5-2.5 (3) MG/3ML nebulizer solution 3 mL (3 mLs Nebulization Given 06/27/22 0718)  cefTRIAXone (ROCEPHIN) 1 g in sodium chloride 0.9 % 100 mL  IVPB (has no administration in time range)  azithromycin (ZITHROMAX) tablet 500 mg (has no administration in time range)  insulin aspart (novoLOG) injection 0-15 Units (2 Units Subcutaneous Given 06/27/22 0751)  ipratropium-albuterol ([name]) 0.5-2.5 (3) MG/3ML nebulizer solution 3 mL ( Nebulization Not Given 06/27/22 0541)  cefTRIAXone (ROCEPHIN) 1 g in sodium chloride 0.9 % 100 mL IVPB (0 g Intravenous Stopped 06/27/22 0634)  azithromycin (ZITHROMAX) tablet 500 mg (500 mg Oral Given 06/27/22 0610)  acetaminophen (TYLENOL) tablet 1,000 mg (1,000 mg Oral Given 06/27/22 0604)    Mobility walks     Focused Assessments Pulmonary Assessment Handoff:  Lung sounds: Bilateral Breath Sounds: Coarse crackles L Breath Sounds: Coarse crackles R Breath Sounds: Coarse crackles O2 Device: Nasal Cannula O2 Flow Rate (L/min): 2  L/min    R Recommendations: See Admitting Provider Note  Report given to:   Additional Notes:

## 2022-06-27 NOTE — Hospital Course (Signed)
4/13: has had cough, rib pain. Doesn't wear oxygen at home. Has an inhaler that he uses at home. On 2.5L right now, 93%.   4/15: says he feels better, cough is improved a lot too. Denies fever chills. Ate-coughed-then vomited.   4/17: cough better, feeling better overall. No fever or chills, n/v.

## 2022-06-27 NOTE — ED Provider Notes (Signed)
[place] AT [facility] Provider Note   CSN: [id number] Arrival date & time: 06/27/22  0111     History  Chief Complaint  Patient presents with   Shortness of Breath    Patient to ED via EMS with complaint of sob x 3 days. Patient receiving duoneb on arrival. EMS reports breathing treatment seems to offer patient some relief.     [name] is a 52 y.o. male.   Shortness of Breath [name] interpreter was used for the entirety of visit Patient is a 52 year old male with past medical history significant for HIV a, history of PJP prescribed Bactrim which he states that he takes DM2  Patient is present emergency room today approximately 1 month of cough and fatigue.  He states no fevers at home he states he has been taking his HIV meds and Bactrim.  He denies any nausea or vomiting.  He does indicate that he has had some chest discomfort no hemoptysis.  He has not needed to use oxygen for quite some time per patient.  He is currently on 2 L nasal cannula.  Denies any unilateral leg swelling    Home Medications Prior to Admission medications   Medication Sig Start Date End Date Taking? Authorizing Provider  aspirin EC 81 MG tablet Take 1 tablet (81 mg total) by mouth daily. Swallow whole. 09/30/21 09/30/22  Doran Stabler, DO  cetirizine (ZYRTEC) 5 MG tablet Take 1 tablet (5 mg total) by mouth daily. 09/30/21 12/15/21  Doran Stabler, DO  Darunavir-Cobicistat-Emtricitabine-Tenofovir Alafenamide (SYMTUZA) 800-150-200-10 MG TABS Take 1 tablet by mouth daily with breakfast. 04/16/22   Kuppelweiser, Cassie L, RPH-CPP  dolutegravir (TIVICAY) 50 MG tablet Take 1 tablet (50 mg total) by mouth daily. 04/16/22   Kuppelweiser, Cassie L, RPH-CPP  guaiFENesin 200 MG/10ML LIQD Take 5 mLs by mouth every 4 (four) hours as needed for cough or to loosen phlegm. Patient not taking: Reported on 03/17/2022 11/19/21   Modena Slater, DO  guaiFENesin-dextromethorphan (ROBITUSSIN DM)  100-10 MG/5ML syrup Take 5 mLs by mouth every 4 (four) hours as needed for cough. Patient not taking: Reported on 06/23/2022 03/17/22   Judyann Munson, MD  ibuprofen (ADVIL) 200 MG tablet Take 400 mg by mouth every 6 (six) hours as needed for mild pain, fever or headache.    [provider]  predniSONE (DELTASONE) 20 MG tablet Take 1 tablet (20 mg total) by mouth daily with breakfast. X 5 days then 1/2 tab for 4 days 06/23/22   Judyann Munson, MD  rosuvastatin (CRESTOR) 20 MG tablet Take 1 tablet (20 mg total) by mouth daily. 06/23/22 06/23/23  Judyann Munson, MD  sulfamethoxazole-trimethoprim (BACTRIM DS) 800-160 MG tablet Take 1 tablet by mouth daily. 06/23/22   Judyann Munson, MD  triamcinolone 0.1%-Aquaphor equivlanet 1:1 ointment mixture Apply topically 2 (two) times daily. 12/15/21   Judyann Munson, MD  triamcinolone ointment (KENALOG) 0.1 % Apply 1 Application topically 2 (two) times daily. 06/23/22   Judyann Munson, MD  umeclidinium-vilanterol [facility] ELLIPTA) 62.5-25 MCG/ACT AEPB Inhale 1 puff into the lungs daily. Patient not taking: Reported on 12/15/2021 12/08/21   Judyann Munson, MD  vitamin D3 (CHOLECALCIFEROL) 25 MCG tablet Take 1 tablet (1,000 Units total) by mouth daily. 11/19/21   Steffanie Rainwater, MD      Allergies    Metformin and related    Review of Systems   Review of Systems  Respiratory:  Positive for shortness of breath.     Physical  Exam Updated Vital Signs BP 122/68   Pulse 87   Temp 97.8 F (36.6 C)   Resp 18   Ht 5\' 8"  (1.727 m)   Wt 86 kg   SpO2 98%   BMI 28.83 kg/m  Physical Exam Vitals and nursing note reviewed.  Constitutional:      Appearance: He is ill-appearing.     Comments: Chronically unwell appearing 52 year old   HENT:     Head: Normocephalic and atraumatic.     Nose: Nose normal.     Mouth/Throat:     Mouth: Mucous membranes are moist.  Eyes:     General: No scleral icterus. Cardiovascular:     Rate and Rhythm: Normal rate and  regular rhythm.     Pulses: Normal pulses.     Heart sounds: Normal heart sounds.  Pulmonary:     Comments: Faint crackles, good air movement, no wheezing Abdominal:     Palpations: Abdomen is soft.     Tenderness: There is no abdominal tenderness. There is no guarding or rebound.  Musculoskeletal:     Cervical back: Normal range of motion.     Right lower leg: Edema present.     Left lower leg: Edema present.  Skin:    General: Skin is warm and dry.     Capillary Refill: Capillary refill takes less than 2 seconds.  Neurological:     Mental Status: He is alert. Mental status is at baseline.  Psychiatric:        Mood and Affect: Mood normal.        Behavior: Behavior normal.     ED Results / Procedures / Treatments   Labs (all labs ordered are listed, but only abnormal results are displayed) Labs Reviewed  CBC - Abnormal; Notable for the following components:      Result Value   RBC 3.89 (*)    Hemoglobin 12.7 (*)    MCV 102.8 (*)    All other components within normal limits  COMPREHENSIVE METABOLIC PANEL - Abnormal; Notable for the following components:   Glucose, Bld 230 (*)    BUN 26 (*)    Creatinine, Ser 1.57 (*)    Albumin 3.1 (*)    GFR, Estimated 53 (*)    All other components within normal limits  RESP PANEL BY RT-PCR (RSV, FLU A&B, COVID)  RVPGX2  CULTURE, BLOOD (SINGLE)  CULTURE, BLOOD (SINGLE)  EXPECTORATED SPUTUM ASSESSMENT W GRAM STAIN, RFLX TO RESP C  BRAIN NATRIURETIC PEPTIDE  PNEUMOCYSTIS PCR  STREP PNEUMONIAE URINARY ANTIGEN  I-STAT CHEM 8, ED  TROPONIN I (HIGH SENSITIVITY)    EKG EKG Interpretation  Date/Time:  Saturday June 27 2022 03:09:09 EDT Ventricular Rate:  82 PR Interval:  136 QRS Duration: 89 QT Interval:  364 QTC Calculation: 426 R Axis:   265 Text Interpretation: Sinus rhythm Right superior axis ST elev, probable normal early repol pattern When compared with ECG of 11/15/2021, HEART RATE has decreased Confirmed by Dione Booze  ([id number]) on 06/27/2022 3:46:39 AM  Radiology DG Chest 2 View  Result Date: 06/27/2022 CLINICAL DATA:  Shortness of breath, cough. EXAM: CHEST - 2 VIEW COMPARISON:  11/15/2021. FINDINGS: The heart size and mediastinal contours are within normal limits. Emphysematous changes are present in the lungs. Multifocal bronchiectasis with scattered opacities are noted in the left lung. There is blunting of the left costophrenic angle, possible trace pleural effusion. Strandy atelectasis is present at the right lung base. No pneumothorax bilaterally. No acute osseous  abnormality. IMPRESSION: 1. Multifocal bronchiectasis with scattered opacities in the left lung, concerning for pneumonia. 2. Blunting of the left costophrenic angle, possible trace left pleural effusion. 3. Emphysema. Electronically Signed   By: Thornell Sartorius M.D.   On: 06/27/2022 03:44    Procedures Procedures    Medications Ordered in ED Medications  ipratropium-albuterol ([name]) 0.5-2.5 (3) MG/3ML nebulizer solution 3 mL ( Nebulization Canceled Entry 06/27/22 0425)  cefTRIAXone (ROCEPHIN) 1 g in sodium chloride 0.9 % 100 mL IVPB (1 g Intravenous New Bag/Given 06/27/22 0604)  aspirin EC tablet 81 mg (has no administration in time range)  Darunavir-Cobicistat-Emtricitabine-Tenofovir Alafenamide (SYMTUZA) 800-150-200-10 MG TABS 1 tablet (has no administration in time range)  dolutegravir (TIVICAY) tablet 50 mg (has no administration in time range)  sulfamethoxazole-trimethoprim (BACTRIM DS) 800-160 MG per tablet 1 tablet (has no administration in time range)  rosuvastatin (CRESTOR) tablet 20 mg (has no administration in time range)  Cholecalciferol 1,000 Units (has no administration in time range)  umeclidinium-vilanterol (ANORO ELLIPTA) 62.5-25 MCG/ACT 1 puff (has no administration in time range)  enoxaparin (LOVENOX) injection 40 mg (has no administration in time range)  acetaminophen (TYLENOL) tablet 650 mg (has no administration in time  range)    Or  acetaminophen (TYLENOL) suppository 650 mg (has no administration in time range)  guaiFENesin-dextromethorphan (ROBITUSSIN DM) 100-10 MG/5ML syrup 5 mL (has no administration in time range)  predniSONE (DELTASONE) tablet 40 mg (has no administration in time range)  ipratropium-albuterol ([name]) 0.5-2.5 (3) MG/3ML nebulizer solution 3 mL (has no administration in time range)  ipratropium-albuterol ([name]) 0.5-2.5 (3) MG/3ML nebulizer solution 3 mL ( Nebulization Not Given 06/27/22 0541)  azithromycin (ZITHROMAX) tablet 500 mg (500 mg Oral Given 06/27/22 0610)  acetaminophen (TYLENOL) tablet 1,000 mg (1,000 mg Oral Given 06/27/22 3748)    ED Course/ Medical Decision Making/ A&P                             Medical Decision Making Amount and/or Complexity of Data Reviewed Labs: ordered. Radiology: ordered.  Risk OTC drugs. Prescription drug management. Decision regarding hospitalization.   This patient presents to the ED for concern of chest discomfort and shortness of breath, this involves a number of treatment options, and is a complaint that carries with it a high risk of complications and morbidity. A differential diagnosis was considered for the patient's symptoms which is discussed below:   The causes for shortness of breath include but are not limited to Cardiac (AHF, pericardial effusion and tamponade, arrhythmias, ischemia, etc) Respiratory (COPD, asthma, pneumonia, pneumothorax, primary pulmonary hypertension, PE/VQ mismatch) Hematological (anemia) Neuromuscular (ALS, Guillain-Barr, etc)    Co morbidities: Discussed in HPI   Brief History:  [name] interpreter was used for the entirety of visit Patient is a 52 year old male with past medical history significant for HIV a, history of PJP prescribed Bactrim which he states that he takes DM2  Patient is present emergency room today approximately 1 month of cough and fatigue.  He states no fevers at home he  states he has been taking his HIV meds and Bactrim.  He denies any nausea or vomiting.  He does indicate that he has had some chest discomfort no hemoptysis.  He has not needed to use oxygen for quite some time per patient.  He is currently on 2 L nasal cannula.  Denies any unilateral leg swelling   EMR reviewed including pt PMHx, past surgical history and past visits  to ER.   See HPI for more details   Lab Tests:   BNP normal RSV COVID influenza negative CBC without profound anemia no leukocytosis present CMP with marginally elevated creatinine from baseline BUN also elevated Troponin x 2 within normal   Imaging Studies:  Chest x-ray shows infiltrates    Cardiac Monitoring:  The patient was maintained on a cardiac monitor.  I personally viewed and interpreted the cardiac monitored which showed an underlying rhythm of: NSR EKG non-ischemic   Medicines ordered:  I ordered medication including Rocephin, azithromycin, Tylenol, [name] for Pneumonia Reevaluation of the patient after these medicines showed that the patient improved I have reviewed the patients home medicines and have made adjustments as needed   Critical Interventions:     Consults/Attending Physician   I requested consultation with congenital fusion resident,  and discussed lab and imaging findings as well as pertinent plan - they recommend: Admit   Reevaluation:  After the interventions noted above I re-evaluated patient and found that they have :improved   Social Determinants of Health:  The patient's social determinants of health were a factor in the care of this patient    Problem List / ED Course:  Pneumonia HIV patient tachycardic chest x-ray Very mildly   Dispostion:  After consideration of the diagnostic results and the patients response to treatment, I feel that the patent would benefit from admission    Final Clinical Impression(s) / ED Diagnoses Final diagnoses:   Pneumonia of both lungs due to infectious organism, unspecified part of lung    Rx / DC Orders ED Discharge Orders     None         Gailen Shelter, [place] 06/27/22 0713    Dione Booze, MD 06/27/22 2246

## 2022-06-27 NOTE — ED Notes (Signed)
Pt refused colostomy care.

## 2022-06-27 NOTE — H&P (Signed)
Date: 06/27/2022               Patient Name:  [name] MRN: [id number]  DOB: [birth year] Age / Sex: 52 y.o., male   PCP: Marolyn Haller, MD         Medical Service: Internal Medicine Teaching Service         Attending Physician: Dr. Mayford Knife, Dorene Ar, MD      First Contact: Dr. [name], MD Pager [phone]    Second Contact: Dr. 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  Denies any fevers.  He has been taking his HIV medications including Symtuza, Tivicay, and Bactrim as well as his inhaler Anora Ellipta.  Unfortunately there are no [name] interpreters with the patient understands a little bit of [place].  Full history will be obtained when a [name] interpreter is available.  ED Course: Presented afebrile, mildly tachypneic with a respiratory rate of 22 and satting well on 2 L nasal cannula, but otherwise hemodynamically stable.  Lab work showed blood glucose of 230, elevated creatinine 1.57, white blood cell count of 6.6, BNP of 80 and troponin of 7.  Chest x-ray showed multifocal bronchiectasis with scattered opacities in the left lung concerning for pneumonia and possible left pleural effusion.  Patient was started on ceftriaxone and azithromycin and IMTS was paged for admission.  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  Meds:  Medications confirmed by patient Symtuza Tivicay Bactrim Simonne Maffucci  Past Surgical History Past Surgical History:  Procedure Laterality Date   BRONCHIAL WASHINGS Bilateral 04/23/2021   Procedure: BRONCHIAL WASHINGS;  Surgeon: Lorin Glass, MD;  Location: [facility] ENDOSCOPY;  Service: Pulmonary;  Laterality: Bilateral;   FRACTURE SURGERY     IM NAILING TIBIA Right 03/21/2014   ORIF ANKLE FRACTURE Right 03/21/2014   lateral malleolus/notes 03/21/2014   ORIF ANKLE FRACTURE Right 03/21/2014   Procedure: OPEN REDUCTION INTERNAL FIXATION (ORIF) pilon ;  Surgeon: Eldred Manges, MD;  Location: [facility] OR;  Service: Orthopedics;  Laterality: Right;   TEE WITHOUT CARDIOVERSION N/A 05/23/2019   Procedure: TRANSESOPHAGEAL ECHOCARDIOGRAM (TEE);  Surgeon: Chilton Si, MD;  Location: [facility] ENDOSCOPY;  Service: Cardiovascular;  Laterality: N/A;   TIBIA IM NAIL INSERTION Right 03/21/2014   Procedure: INTRAMEDULLARY (IM) NAIL TIBIAL;  Surgeon: Eldred Manges, MD;  Location: [facility] OR;  Service: Orthopedics;  Laterality: Right;   VIDEO BRONCHOSCOPY Bilateral 03/02/2016   Procedure: VIDEO BRONCHOSCOPY WITHOUT FLUORO;  Surgeon: Roslynn Amble, MD;  Location[facility] ENDOSCOPY;  Service: Cardiopulmonary;  Laterality: Bilateral;   VIDEO BRONCHOSCOPY Left 04/23/2021   Procedure: VIDEO BRONCHOSCOPY WITHOUT FLUORO;  Surgeon: Lorin Glass, MD;  Location: [facility] ENDOSCOPY;  Service: Pulmonary;  Laterality: Left;    Social:  Lives alone and has no help with iADLs. 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  Denies any EtOH, tobacco use, or recreational drug use. Family lives in [place].  PCP is Dr. Marolyn Haller.  Family History:  Family History  Problem Relation Age of Onset   Hypertension Other    Heart disease Sister      Allergies: Allergies as of 06/27/2022 - Review Complete 06/27/2022  Allergen Reaction Noted   Metformin and related Swelling and Rash 02/27/2021    Review of Systems: A complete ROS was negative except as per HPI.   OBJECTIVE:   Physical Exam: Blood pressure  114/71, pulse 82, temperature 97.8 F (36.6 C), resp. rate 19, height 5\' 8"  (1.727 m), weight 86 kg, SpO2 99 %.  Constitutional: Tired appearing male laying in bed, appears older than stated age. In no acute distress. HENT: Normocephalic, atraumatic,  Eyes: Sclera non-icteric, PERRL, EOM intact Cardio:Regular rate and rhythm.  2+ bilateral radial and dorsalis pedis  pulses. Pulm: Significant rhonchi and wheezing throughout lung fields. Abdomen: Soft, non-tender, non-distended MSK: 1+ pitting edema in the lower extremities bilaterally Skin:Warm and dry. Neuro:Alert. No focal deficit noted. Psych:Pleasant mood and affect.  Labs: CBC    Component Value Date/Time   WBC 6.6 06/27/2022 0255   RBC 3.89 (L) 06/27/2022 0255   HGB 12.7 (L) 06/27/2022 0255   HGB 16.1 09/03/2021 1215   HCT 40.0 06/27/2022 0255   HCT 47.3 09/03/2021 1215   PLT 191 06/27/2022 0255   PLT 266 09/03/2021 1215   MCV 102.8 (H) 06/27/2022 0255   MCV 98 (H) 09/03/2021 1215   MCH 32.6 06/27/2022 0255   MCHC 31.8 06/27/2022 0255   RDW 13.1 06/27/2022 0255   RDW 11.9 09/03/2021 1215   LYMPHSABS 832 (L) 06/23/2022 1509   MONOABS 0.7 11/17/2021 2237   EOSABS 480 06/23/2022 1509   BASOSABS 12 06/23/2022 1509     CMP     Component Value Date/Time   NA 136 06/27/2022 0255   NA 134 09/03/2021 1215   K 3.9 06/27/2022 0255   CL 101 06/27/2022 0255   CO2 25 06/27/2022 0255   GLUCOSE 230 (H) 06/27/2022 0255   BUN 26 (H) 06/27/2022 0255   BUN 36 (H) 09/03/2021 1215   CREATININE 1.57 (H) 06/27/2022 0255   CREATININE 1.45 (H) 06/23/2022 1509   CALCIUM 9.1 06/27/2022 0255   PROT 7.2 06/27/2022 0255   ALBUMIN 3.1 (L) 06/27/2022 0255   AST 20 06/27/2022 0255   ALT 19 06/27/2022 0255   ALKPHOS 80 06/27/2022 0255   BILITOT 0.4 06/27/2022 0255   GFRNONAA 53 (L) 06/27/2022 0255   GFRNONAA 68 12/04/2019 1118   GFRAA 79 12/04/2019 1118    Imaging: DG Chest 2 View Result Date: 06/27/2022 IMPRESSION: 1. Multifocal  bronchiectasis with scattered opacities in the left lung, concerning for pneumonia. 2. Blunting of the left costophrenic angle, possible trace left pleural effusion. 3. Emphysema.  Electronically Signed   By: Thornell Sartorius M.D.   On: 06/27/2022 03:44     EKG: personally reviewed my interpretation is normal sinus rhythm. Consistent with prior EKG.  ASSESSMENT & PLAN:   Assessment & Plan by Problem: Principal Problem:   CAP (community acquired pneumonia) Active Problems:   AIDS (acquired immune deficiency syndrome)   Chronic systolic CHF (congestive heart failure)   Pulmonary emphysema   Type 2 diabetes mellitus   S/P colostomy   [name] is a 52 y.o. male with pertinent PMH of HIV/AIDS (CD4 95, VL 43,100), treated pulmonary TB, PJP emphysema, chronic bronchiectasis, rectal condyloma s/p colectomy who presented with 1 month of dyspnea with cough and is admitted for pneumonia.  Community-acquired pneumonia HIV/AIDS with  CD4 count of 95 in 06/2022 Chronic bronchiectasis and emphysema History of treated pulmonary TB Immunocompromised patient with emphysema and chronic bronchiectasis having persistent dyspnea and evidence of left-sided pneumonia on chest x-ray despite chronic Bactrim and adherence to antiretrovirals.  Unable to give full story due to translation service issues.  He does have significant wheezing on exam.  Has been started on ceftriaxone and azithromycin which we will continue along with his home Bactrim.  Will also further evaluate with sputum sample for culture, Gram stain, PJP, blood cultures. - Ceftriaxone and azithromycin for CAP coverage - Prednisone 40 mg daily - Follow-up labs as above - Continue home Bactrim, Tivicay, and Symtuza  HFpEF Echo from 11/2021 shows LVEF of 50%.  Cardiology has seen the patient in 11/2021 and did not recommend any therapy at this time as his EF is fluctuated between 40 and 65% based on his heart rate.  He does have some lower  extremity edema today but does not have BNP elevation or troponin elevation.  Will not get an echocardiogram at this time unless something changes.  T2DM A1c 6.3 in 11/2021.  Blood sugar elevated at 230 here.  Will place on sliding scale insulin and repeat hemoglobin A1c here.  AKI Baseline creatinine appears to be around 1.2.  Creatinine here 1.57.  Multiple contributing factors including HIV, diabetes, and acute infection.  Will treat his infection and continue to monitor his renal function and urine output.  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  Diet: Heart Healthy VTE: Enoxaparin IVF: None Code: Unknown, unable to get due to interpreter difficulty  Dispo: Admit patient to Observation with expected length of stay less than 2 midnights.  Signed: Doran Stabler, DO Internal Medicine Resident PGY-1  06/27/2022, 6:23 AM   Dr. [name], MD Pager [phone]

## 2022-06-27 NOTE — Progress Notes (Addendum)
Subjective:   Summary: [name] is a 52 y.o. year old male currently admitted on the IMTS HD#0 for acute hypoxic respiratory failure in the setting of pneumonia.  Overnight Events: No overnight events  Pt seen at bedside this AM. States he has had a cough and rib pain going on for a couple weeks now. He states that he does not wear oxygen at home, and usually his inhaler helps relieve the symptoms.  Currently on 2.5L right now, 93%  Objective:  Vital signs in last 24 hours: Vitals:   06/27/22 1000 06/27/22 1015 06/27/22 1030 06/27/22 1045  BP: 114/78 116/72 119/78 121/85  Pulse: 83 87 90 98  Resp: 17 18 18  (!) 21  Temp:      TempSrc:      SpO2: 90% 91% 94% 93%  Weight:      Height:       Supplemental O2: Nasal Cannula SpO2: 93 % O2 Flow Rate (L/min): 2 L/min   Physical Exam:  Constitutional:Uncomfortable appearing male Cardiovascular: RRR, no murmurs, rubs or gallops Pulmonary/Chest: normal work of breathing on room air, diffuse wheezes heard throughout airfield Abdominal: soft, non-tender, non-distended Skin: warm and dry Extremities: upper/lower extremity pulses 2+, no lower extremity edema present  [facility] Weights   06/27/22 0117  Weight: 86 kg     Intake/Output Summary (Last 24 hours) at 06/27/2022 1232 Last data filed at 06/27/2022 3875 Gross per 24 hour  Intake 95.82 ml  Output --  Net 95.82 ml   Net IO Since Admission: 95.82 mL [06/27/22 1232]  Pertinent Labs:    Latest Ref Rng & Units 06/27/2022    2:55 AM 06/23/2022    3:09 PM 03/17/2022    3:08 PM  CBC  WBC 4.0 - 10.5 K/uL 6.6  4.0  3.7   Hemoglobin 13.0 - 17.0 g/dL 64.3  32.9  51.8   Hematocrit 39.0 - 52.0 % 40.0  41.2  41.9   Platelets 150 - 400 K/uL 191  218  266        Latest Ref Rng & Units 06/27/2022    2:55 AM 06/23/2022    3:09 PM 03/17/2022    3:08 PM  CMP  Glucose 70 - 99 mg/dL 841  660  630   BUN 6 - 20 mg/dL 26  17  17    Creatinine 0.61 - 1.24 mg/dL 1.60   1.09  3.23   Sodium 135 - 145 mmol/L 136  140  136   Potassium 3.5 - 5.1 mmol/L 3.9  3.8  3.7   Chloride 98 - 111 mmol/L 101  100  96   CO2 22 - 32 mmol/L 25  34  31   Calcium 8.9 - 10.3 mg/dL 9.1  8.9  9.0   Total Protein 6.5 - 8.1 g/dL 7.2  7.3  8.0   Total Bilirubin 0.3 - 1.2 mg/dL 0.4  0.5  0.2   Alkaline Phos 38 - 126 U/L 80     AST 15 - 41 U/L 20  14  13    ALT 0 - 44 U/L 19  7  9       Imaging: DG Chest 2 View  Result Date: 06/27/2022 CLINICAL DATA:  Shortness of breath, cough. EXAM: CHEST - 2 VIEW COMPARISON:  11/15/2021. FINDINGS: The heart size and mediastinal contours are within normal limits. Emphysematous changes are present in the lungs. Multifocal bronchiectasis  with scattered opacities are noted in the left lung. There is blunting of the left costophrenic angle, possible trace pleural effusion. Strandy atelectasis is present at the right lung base. No pneumothorax bilaterally. No acute osseous abnormality. IMPRESSION: 1. Multifocal bronchiectasis with scattered opacities in the left lung, concerning for pneumonia. 2. Blunting of the left costophrenic angle, possible trace left pleural effusion. 3. Emphysema. Electronically Signed   By: Thornell Sartorius M.D.   On: 06/27/2022 03:44     Assessment/Plan:   Principal Problem:   CAP (community acquired pneumonia) Active Problems:   AIDS (acquired immune deficiency syndrome)   Chronic systolic CHF (congestive heart failure)   Pulmonary emphysema   Type 2 diabetes mellitus   S/P colostomy   Patient Summary: [name] is a 52 y.o. 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    #Acute Hypoxic Respiratory Failure  #Community Acquired Pneumonia vs Opportunistic  Infection in the Setting of HIV/AIDs #Chronic Bronchiectasis and Emphysema  #Hx of treated Pulmonary TB Pt states that he has had increased shortness of breath and cough for the last two weeks. He has also been feeling slightly feverish. Last CD4/HIV RNA quant was 93/ 104 done on 06/23/2022.  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 Hollice Espy of infectious disease, last appointment was on 06/23/22. He takes bactrim daily for prophylaxis. Chest x-ray shows multifocal bronchiectasis with scattered opacities, possible left pleural effusion,    Given his uncontrolled HIV and AIDs, currently cannot rule out opportunistic infection until further testing is required.   On my physical exam today, he is wheezing profusely throughout his lung field, and was on 2.5L saturating at 94%.   Repiratory culture with gram stain shows few gram negative rods, moderate gram positive cocci, and rare gram positive rods. 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   Plan:  - Continue Ceftriaxone and Azithromycin for CAP coverage  - Prednisone  daily  - Follow up sputum sample, Gram stain, PJP, and blood cultures  - F/U Acid Fast stain  - Continue home Bactrim, Tivicay, and Symtuza  - Wean oxygen as tolerated.  -F/U CT scan  #AKI Baseline creatinine appearas to be around 1.2, creatinine on admission is 1.57. Will monitor with tomorrows labs.   #HFpEF Echo from 11/2021 shows a LVEF of 50%. I did not appreciate any lower extremity edema on my exam, and think his respiratory status is secondary to the pneumonia rather than a heart failure exacerbation. BNP and troponin within normal limits.    Diet: Normal IVF: None,None VTE: Enoxaparin Code: Full PT/OT recs: None, none.    Dispo: Anticipated discharge to Home in 3 days pending medical management.   [name], MD PGY-1 Internal Medicine Resident Pager Number [phone] Please contact the on call  pager after 5 pm and on weekends at [phone].

## 2022-06-28 ENCOUNTER — Observation Stay (HOSPITAL_COMMUNITY): Payer: 59

## 2022-06-28 DIAGNOSIS — J206 Acute bronchitis due to rhinovirus: Secondary | ICD-10-CM | POA: Diagnosis not present

## 2022-06-28 DIAGNOSIS — Z87891 Personal history of nicotine dependence: Secondary | ICD-10-CM

## 2022-06-28 DIAGNOSIS — N179 Acute kidney failure, unspecified: Secondary | ICD-10-CM | POA: Diagnosis present

## 2022-06-28 DIAGNOSIS — B9789 Other viral agents as the cause of diseases classified elsewhere: Secondary | ICD-10-CM

## 2022-06-28 DIAGNOSIS — B20 Human immunodeficiency virus [HIV] disease: Secondary | ICD-10-CM | POA: Diagnosis present

## 2022-06-28 DIAGNOSIS — J1289 Other viral pneumonia: Secondary | ICD-10-CM | POA: Diagnosis present

## 2022-06-28 DIAGNOSIS — Z86711 Personal history of pulmonary embolism: Secondary | ICD-10-CM | POA: Diagnosis not present

## 2022-06-28 DIAGNOSIS — J9601 Acute respiratory failure with hypoxia: Secondary | ICD-10-CM

## 2022-06-28 DIAGNOSIS — E1165 Type 2 diabetes mellitus with hyperglycemia: Secondary | ICD-10-CM | POA: Diagnosis present

## 2022-06-28 DIAGNOSIS — I5022 Chronic systolic (congestive) heart failure: Secondary | ICD-10-CM | POA: Diagnosis present

## 2022-06-28 DIAGNOSIS — Z79899 Other long term (current) drug therapy: Secondary | ICD-10-CM | POA: Diagnosis not present

## 2022-06-28 DIAGNOSIS — J441 Chronic obstructive pulmonary disease with (acute) exacerbation: Secondary | ICD-10-CM | POA: Diagnosis present

## 2022-06-28 DIAGNOSIS — J440 Chronic obstructive pulmonary disease with acute lower respiratory infection: Secondary | ICD-10-CM | POA: Diagnosis present

## 2022-06-28 DIAGNOSIS — J439 Emphysema, unspecified: Secondary | ICD-10-CM

## 2022-06-28 DIAGNOSIS — Z8611 Personal history of tuberculosis: Secondary | ICD-10-CM | POA: Diagnosis not present

## 2022-06-28 DIAGNOSIS — Z8249 Family history of ischemic heart disease and other diseases of the circulatory system: Secondary | ICD-10-CM | POA: Diagnosis not present

## 2022-06-28 DIAGNOSIS — J189 Pneumonia, unspecified organism: Secondary | ICD-10-CM

## 2022-06-28 DIAGNOSIS — E1122 Type 2 diabetes mellitus with diabetic chronic kidney disease: Secondary | ICD-10-CM | POA: Diagnosis present

## 2022-06-28 DIAGNOSIS — Z86718 Personal history of other venous thrombosis and embolism: Secondary | ICD-10-CM | POA: Diagnosis not present

## 2022-06-28 DIAGNOSIS — J470 Bronchiectasis with acute lower respiratory infection: Secondary | ICD-10-CM | POA: Diagnosis present

## 2022-06-28 DIAGNOSIS — J479 Bronchiectasis, uncomplicated: Secondary | ICD-10-CM

## 2022-06-28 DIAGNOSIS — Z888 Allergy status to other drugs, medicaments and biological substances status: Secondary | ICD-10-CM | POA: Diagnosis not present

## 2022-06-28 DIAGNOSIS — N182 Chronic kidney disease, stage 2 (mild): Secondary | ICD-10-CM | POA: Diagnosis present

## 2022-06-28 DIAGNOSIS — E860 Dehydration: Secondary | ICD-10-CM | POA: Diagnosis present

## 2022-06-28 DIAGNOSIS — Z7951 Long term (current) use of inhaled steroids: Secondary | ICD-10-CM | POA: Diagnosis not present

## 2022-06-28 DIAGNOSIS — Z1152 Encounter for screening for COVID-19: Secondary | ICD-10-CM | POA: Diagnosis not present

## 2022-06-28 DIAGNOSIS — Z7982 Long term (current) use of aspirin: Secondary | ICD-10-CM | POA: Diagnosis not present

## 2022-06-28 DIAGNOSIS — Z933 Colostomy status: Secondary | ICD-10-CM | POA: Diagnosis not present

## 2022-06-28 LAB — CBC
HCT: 41.2 % (ref 39.0–52.0)
Hemoglobin: 13.5 g/dL (ref 13.0–17.0)
MCH: 32.9 pg (ref 26.0–34.0)
MCHC: 32.8 g/dL (ref 30.0–36.0)
MCV: 100.5 fL — ABNORMAL HIGH (ref 80.0–100.0)
Platelets: 223 10*3/uL (ref 150–400)
RBC: 4.1 MIL/uL — ABNORMAL LOW (ref 4.22–5.81)
RDW: 13.2 % (ref 11.5–15.5)
WBC: 9.5 10*3/uL (ref 4.0–10.5)
nRBC: 0 % (ref 0.0–0.2)

## 2022-06-28 LAB — BASIC METABOLIC PANEL
Anion gap: 8 (ref 5–15)
BUN: 21 mg/dL — ABNORMAL HIGH (ref 6–20)
CO2: 29 mmol/L (ref 22–32)
Calcium: 9.1 mg/dL (ref 8.9–10.3)
Chloride: 98 mmol/L (ref 98–111)
Creatinine, Ser: 1.3 mg/dL — ABNORMAL HIGH (ref 0.61–1.24)
GFR, Estimated: >60 mL/min (ref >60)
Glucose, Bld: 118 mg/dL — ABNORMAL HIGH (ref 70–99)
Potassium: 3.8 mmol/L (ref 3.5–5.1)
Sodium: 135 mmol/L (ref 135–145)

## 2022-06-28 LAB — DIFFERENTIAL
Abs Immature Granulocytes: 0.03 10*3/uL (ref 0.00–0.07)
Basophils Absolute: 0 10*3/uL (ref 0.0–0.1)
Basophils Relative: 0 %
Eosinophils Absolute: 0.1 10*3/uL (ref 0.0–0.5)
Eosinophils Relative: 1 %
Immature Granulocytes: 0 %
Lymphocytes Relative: 14 %
Lymphs Abs: 1.4 10*3/uL (ref 0.7–4.0)
Monocytes Absolute: 1.2 10*3/uL — ABNORMAL HIGH (ref 0.1–1.0)
Monocytes Relative: 12 %
Neutro Abs: 7.1 10*3/uL (ref 1.7–7.7)
Neutrophils Relative %: 73 %

## 2022-06-28 LAB — GLUCOSE, CAPILLARY
Glucose-Capillary: 127 mg/dL — ABNORMAL HIGH (ref 70–99)
Glucose-Capillary: 185 mg/dL — ABNORMAL HIGH (ref 70–99)
Glucose-Capillary: 154 mg/dL — ABNORMAL HIGH (ref 70–99)
Glucose-Capillary: 173 mg/dL — ABNORMAL HIGH (ref 70–99)

## 2022-06-28 LAB — CULTURE, RESPIRATORY W GRAM STAIN

## 2022-06-28 LAB — HEMOGLOBIN A1C
Hgb A1c MFr Bld: 5.9 % — ABNORMAL HIGH (ref 4.8–5.6)
Mean Plasma Glucose: 122.63 mg/dL

## 2022-06-28 MED ORDER — GUAIFENESIN-CODEINE 100-10 MG/5ML PO SOLN: 5.0000 mL | ORAL | Status: DC | PRN

## 2022-06-28 NOTE — Plan of Care (Signed)
  Problem: Clinical Measurements: Goal: Ability to maintain clinical measurements within normal limits will improve Outcome: Progressing Goal: Diagnostic test results will improve Outcome: Progressing Goal: Respiratory complications will improve Outcome: Progressing Goal: Cardiovascular complication will be avoided Outcome: Progressing   

## 2022-06-28 NOTE — Progress Notes (Cosign Needed Addendum)
Subjective:   Summary: [name] is a 52 y.o. year old male currently admitted on the IMTS HD#1 for acute hypoxic respiratory failure in the setting of pneumonia.  Overnight Events: No overnight events  Pt seen at bedside this AM. He states that he feels better, but was not able to answer how. He still has cough, which he states is his biggest concern at the moment. Cough is also associated with chest pain. He also states that he gets short of breath when he does cough, which is why he uses the oxygen. He denies any weight loss, fevers, or night sweats.   Objective:  Vital signs in last 24 hours: Vitals:   06/28/22 0356 06/28/22 0505 06/28/22 0738 06/28/22 0812  BP:  123/80 132/83   Pulse: 97 95 99   Resp:  20 18   Temp:  98 F (36.7 C) (!) 97.5 F (36.4 C)   TempSrc:  Oral Oral   SpO2: 92% 93% 92% 95%  Weight:  82.5 kg    Height:       Supplemental O2: Nasal Cannula SpO2: 95 % O2 Flow Rate (L/min): 4 L/min   Physical Exam:  Constitutional:Uncomfortable appearing male Cardiovascular: RRR, no murmurs, rubs or gallops Pulmonary/Chest: normal work of breathing on 5L of nasal cannula, diffuse wheezes heard throughout airfield, improved from yesterday.   Filed Weights   06/27/22 0117 06/28/22 0505  Weight: 86 kg 82.5 kg     Intake/Output Summary (Last 24 hours) at 06/28/2022 1115 Last data filed at 06/28/2022 3832 Gross per 24 hour  Intake 240 ml  Output 2150 ml  Net -1910 ml    Net IO Since Admission: -1,814.18 mL [06/28/22 1115]  Pertinent Labs:    Latest Ref Rng & Units 06/28/2022    2:54 AM 06/27/2022    2:55 AM 06/23/2022    3:09 PM  CBC  WBC 4.0 - 10.5 K/uL 9.5  6.6  4.0   Hemoglobin 13.0 - 17.0 g/dL 91.9  16.6  06.0   Hematocrit 39.0 - 52.0 % 41.2  40.0  41.2   Platelets 150 - 400 K/uL 223  191  218        Latest Ref Rng & Units 06/28/2022    2:54 AM 06/27/2022    2:55 AM 06/23/2022    3:09 PM  CMP  Glucose 70 - 99 mg/dL 045   997  741   BUN 6 - 20 mg/dL 21  26  17    Creatinine 0.61 - 1.24 mg/dL 4.23  9.53  2.02   Sodium 135 - 145 mmol/L 135  136  140   Potassium 3.5 - 5.1 mmol/L 3.8  3.9  3.8   Chloride 98 - 111 mmol/L 98  101  100   CO2 22 - 32 mmol/L 29  25  34   Calcium 8.9 - 10.3 mg/dL 9.1  9.1  8.9   Total Protein 6.5 - 8.1 g/dL  7.2  7.3   Total Bilirubin 0.3 - 1.2 mg/dL  0.4  0.5   Alkaline Phos 38 - 126 U/L  80    AST 15 - 41 U/L  20  14   ALT 0 - 44 U/L  19  7      Imaging: No results found.   Assessment/Plan:   Principal Problem:   CAP (community acquired pneumonia) Active Problems:   AIDS (acquired immune  deficiency syndrome)   Chronic systolic CHF (congestive heart failure)   Pulmonary emphysema   Type 2 diabetes mellitus   S/P colostomy   Patient Summary: [name] is a 52 y.o. 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    #Acute Hypoxic Respiratory Failure  #Community Acquired Pneumonia vs Opportunistic Infection in the Setting of HIV/AIDs #Rhinovirus Infection #Chronic Bronchiectasis and Emphysema  #Hx of treated Pulmonary TB Continuing work up for opportunistic vs community acquired pneumonia. He is currently on 5L of nasal cannula in the room, and he states that he needs it because he gets short of breath when he coughs. On exam, wheezing has decreased compared to yesterday. Lab work shows his WBC count at 9.5, but curious if this is high for him given his history of uncontrolled HIV/AIDs. Diff shows increased in monocytes at 1.2. Gram stain did reveal gram negative rods, moderate gram positive cocci, and rare gram positive rods. They are currently being re cultured for better growth. Respiratory panel revealed that he  is positive for rhinovirus, so differential could also include a bacterial superinfection.  Pneumocystis PCR still pending. CT scan shows  right lower lobe airspace concerning for pneumonia, as well as extensive bronchiectasis and bronchial wall thickening seen throughout the left lung, with probably mucoid impaction.   Currently treating this as CAP, with broad general coverage with ceftriaxone and azithromycin, will narrow when we can rule out opportunistic infection, or when cultures fully return.    Plan:  - Continue Ceftriaxone and Azithromycin for CAP coverage (Day 2) - Prednisone  daily  - Follow up sputum sample, Gram stain, PJP, and blood cultures  - Continue home Bactrim, Tivicay, and Symtuza  - Wean oxygen as tolerated.  - F/U CT scan - Cough control with Guaifenesin-Codeine  #AKI Creatinine has improved from 1.57 to 1.3.  AKI could have been in the setting of acute illness associated dehydration. He states he has no trouble eating while in the hospital, so encouraged him to continue drinking water. Will monitor with tomorrows labs.   Plan:  - Daily BMP  - Avoid nephrotoxic medications  - Consider renal ultrasound.   #HFpEF Echo from 11/2021 shows a LVEF of 50%. I did not appreciate any lower extremity edema on my exam, and think his respiratory status is secondary to the pneumonia rather than a heart failure exacerbation. BNP and troponin within normal limits.    Diet: Normal IVF: None,None VTE: Enoxaparin Code: Full PT/OT recs: None, none.    Dispo: Anticipated discharge to Home in 3 days pending medical management.   [name], MD PGY-1 Internal Medicine Resident Pager Number [phone] Please contact the on call pager after 5 pm and on weekends at [phone].

## 2022-06-29 ENCOUNTER — Telehealth: Payer: Self-pay

## 2022-06-29 ENCOUNTER — Other Ambulatory Visit: Payer: Self-pay

## 2022-06-29 DIAGNOSIS — I5030 Unspecified diastolic (congestive) heart failure: Secondary | ICD-10-CM

## 2022-06-29 DIAGNOSIS — Z933 Colostomy status: Secondary | ICD-10-CM

## 2022-06-29 DIAGNOSIS — N179 Acute kidney failure, unspecified: Secondary | ICD-10-CM

## 2022-06-29 DIAGNOSIS — J206 Acute bronchitis due to rhinovirus: Secondary | ICD-10-CM

## 2022-06-29 DIAGNOSIS — B20 Human immunodeficiency virus [HIV] disease: Secondary | ICD-10-CM | POA: Diagnosis not present

## 2022-06-29 DIAGNOSIS — J189 Pneumonia, unspecified organism: Secondary | ICD-10-CM | POA: Diagnosis not present

## 2022-06-29 DIAGNOSIS — J9601 Acute respiratory failure with hypoxia: Secondary | ICD-10-CM | POA: Diagnosis not present

## 2022-06-29 LAB — BASIC METABOLIC PANEL
Anion gap: 9 (ref 5–15)
BUN: 28 mg/dL — ABNORMAL HIGH (ref 6–20)
CO2: 33 mmol/L — ABNORMAL HIGH (ref 22–32)
Calcium: 9.3 mg/dL (ref 8.9–10.3)
Chloride: 96 mmol/L — ABNORMAL LOW (ref 98–111)
Creatinine, Ser: 1.35 mg/dL — ABNORMAL HIGH (ref 0.61–1.24)
GFR, Estimated: >60 mL/min (ref >60)
Glucose, Bld: 118 mg/dL — ABNORMAL HIGH (ref 70–99)
Potassium: 4.6 mmol/L (ref 3.5–5.1)
Sodium: 138 mmol/L (ref 135–145)

## 2022-06-29 LAB — GLUCOSE, CAPILLARY
Glucose-Capillary: 119 mg/dL — ABNORMAL HIGH (ref 70–99)
Glucose-Capillary: 271 mg/dL — ABNORMAL HIGH (ref 70–99)
Glucose-Capillary: 208 mg/dL — ABNORMAL HIGH (ref 70–99)
Glucose-Capillary: 157 mg/dL — ABNORMAL HIGH (ref 70–99)

## 2022-06-29 LAB — CBC
HCT: 41.5 % (ref 39.0–52.0)
Hemoglobin: 13.7 g/dL (ref 13.0–17.0)
MCH: 33 pg (ref 26.0–34.0)
MCHC: 33 g/dL (ref 30.0–36.0)
MCV: 100 fL (ref 80.0–100.0)
Platelets: 211 10*3/uL (ref 150–400)
RBC: 4.15 MIL/uL — ABNORMAL LOW (ref 4.22–5.81)
RDW: 12.9 % (ref 11.5–15.5)
WBC: 10.4 10*3/uL (ref 4.0–10.5)
nRBC: 0 % (ref 0.0–0.2)

## 2022-06-29 LAB — CULTURE, BLOOD (SINGLE)

## 2022-06-29 NOTE — Progress Notes (Signed)
Patient transferred to 4E at 1055hrs via WC.

## 2022-06-29 NOTE — Plan of Care (Signed)

## 2022-06-29 NOTE — Evaluation (Signed)
Physical Therapy Evaluation Patient Details Name: DEMARIS LEAVELL MRN: [id number] DOB: Oct 10, 1970 Today's Date: 06/29/2022  History of Present Illness  52 y.o. male presents to [facility] hospital on 06/26/1012 with fatigue, HA, and increased oxygen requirements. Pt found to have PNA. PMH includes AIDS/HIV, advanced COPD, chronic bronchiectasis, PE not on anticoagulation, DM, TB.  Clinical Impression  Pt presents to PT with deficits in cardiopulmonary function and endurance. Pt is able to mobilize independently at this time, however he does requires supplemental oxygen and desaturates on 4L when mobilizing. Pt reports mild DOE in this case. Pt is encouraged to mobilize frequently and to utilize incentive spirometer in an effort to improve pulmonary function. PT will continue to follow during admission. No PT needs anticipated at the time of discharge.       Recommendations for follow up therapy are one component of a multi-disciplinary discharge planning process, led by the attending physician.  Recommendations may be updated based on patient status, additional functional criteria and insurance authorization.  Follow Up Recommendations       Assistance Recommended at Discharge None  Patient can return home with the following       Equipment Recommendations None recommended by PT  Recommendations for Other Services       Functional Status Assessment Patient has had a recent decline in their functional status and demonstrates the ability to make significant improvements in function in a reasonable and predictable amount of time.     Precautions / Restrictions Precautions Precautions: Other (comment) Precaution Comments: monitor sats Restrictions Weight Bearing Restrictions: No      Mobility  Bed Mobility Overal bed mobility: Independent                  Transfers Overall transfer level: Independent                      Ambulation/Gait Ambulation/Gait assistance:  Independent Gait Distance (Feet): 400 Feet Assistive device: None Gait Pattern/deviations: WFL(Within Functional Limits) Gait velocity: functional Gait velocity interpretation: >2.62 ft/sec, indicative of community ambulatory   General Gait Details: steady step-through gait, walking multiple laps in the room due to airborne precautions  Stairs            Wheelchair Mobility    Modified Rankin (Stroke Patients Only)       Balance Overall balance assessment: Independent                                           Pertinent Vitals/Pain Pain Assessment Pain Assessment: No/denies pain    Home Living Family/patient expects to be discharged to:: Private residence Living Arrangements: Alone Available Help at Discharge:  (none) Type of Home: Apartment Home Access: Level entry       Home Layout: One level Home Equipment: None      Prior Function Prior Level of Function : Independent/Modified Independent;Driving;Working/employed                     Hand Dominance   Dominant Hand: Right    Extremity/Trunk Assessment   Upper Extremity Assessment Upper Extremity Assessment: Overall WFL for tasks assessed    Lower Extremity Assessment Lower Extremity Assessment: Overall WFL for tasks assessed    Cervical / Trunk Assessment Cervical / Trunk Assessment: Normal  Communication   Communication: Prefers language other than English  Cognition Arousal/Alertness: Awake/alert  Behavior During Therapy: WFL for tasks assessed/performed Overall Cognitive Status: Within Functional Limits for tasks assessed                                          General Comments General comments (skin integrity, edema, etc.): pt on 4L [place] and desats to high 90s, 87% whe  ambulating. sats in low 90s at rest on 4L [place]    Exercises     Assessment/Plan    PT Assessment Patient needs continued PT services  PT Problem List Cardiopulmonary status  limiting activity;Decreased activity tolerance       PT Treatment Interventions Therapeutic exercise;Therapeutic activities;Patient/family education    PT Goals (Current goals can be found in the Care Plan section)  Acute Rehab PT Goals Patient Stated Goal: to return to prior level of function PT Goal Formulation: With patient Time For Goal Achievement: 07/13/22 Potential to Achieve Goals: Good Additional Goals Additional Goal #1: Pt will ambulate >1000' on room air with reports of 1/4 DOE or less, to demonstrate improved activity tolerance    Frequency Min 2X/week     Co-evaluation               AM-PAC PT "6 Clicks" Mobility  Outcome Measure Help needed turning from your back to your side while in a flat bed without using bedrails?: None Help needed moving from lying on your back to sitting on the side of a flat bed without using bedrails?: None Help needed moving to and from a bed to a chair (including a wheelchair)?: None Help needed standing up from a chair using your arms (e.g., wheelchair or bedside chair)?: None Help needed to walk in hospital room?: None Help needed climbing 3-5 steps with a railing? : None 6 Click Score: 24    End of Session Equipment Utilized During Treatment: Oxygen Activity Tolerance: Patient tolerated treatment well Patient left: in bed;with call bell/phone within reach Nurse Communication: Mobility status PT Visit Diagnosis: Other abnormalities of gait and mobility (R26.89)    Time: 4765-4650 PT Time Calculation (min) (ACUTE ONLY): 18 min   Charges:   PT Evaluation $PT Eval Low Complexity: 1 Low          Arlyss Gandy, PT, DPT Acute Rehabilitation Office [phone]   Arlyss Gandy 06/29/2022, 5:28 PM

## 2022-06-29 NOTE — Progress Notes (Signed)
Subjective:   Summary: [name] is a 52 y.o. year old male currently admitted on the IMTS HD#1 for acute hypoxic respiratory failure in the setting of pneumonia.  Overnight Events: No overnight events  Pt seen at bedside this AM. He states that he feels better, cough is improved a lot too. He states that he has had one episode of vomiting yesterday after he coughed while eating. He denies fever chills.   Objective:  Vital signs in last 24 hours: Vitals:   06/28/22 1500 06/28/22 1930 06/29/22 0409 06/29/22 0724  BP:  126/88 135/84   Pulse:  98 (!) 103   Resp:  19 17   Temp:   98.4 F (36.9 C)   TempSrc:   Oral   SpO2: 91% 93% (!) 89% 93%  Weight:   81.1 kg   Height:       Supplemental O2: Nasal Cannula SpO2: 93 % O2 Flow Rate (L/min): 4 L/min   Physical Exam:  Constitutional:Uncomfortable appearing male Cardiovascular: RRR, no murmurs, rubs or gallops Pulmonary/Chest: normal work of breathing on 4L of nasal cannula, decreased breath sounds heard on the right side. Mild wheezing heard throughout lung field.   Filed Weights   06/27/22 0117 06/28/22 0505 06/29/22 0409  Weight: 86 kg 82.5 kg 81.1 kg     Intake/Output Summary (Last 24 hours) at 06/29/2022 0829 Last data filed at 06/29/2022 4098 Gross per 24 hour  Intake 376.89 ml  Output 1980 ml  Net -1603.11 ml    Net IO Since Admission: -3,417.29 mL [06/29/22 0829]  Pertinent Labs:    Latest Ref Rng & Units 06/29/2022    3:18 AM 06/28/2022    2:54 AM 06/27/2022    2:55 AM  CBC  WBC 4.0 - 10.5 K/uL 10.4  9.5  6.6   Hemoglobin 13.0 - 17.0 g/dL 11.9  14.7  82.9   Hematocrit 39.0 - 52.0 % 41.5  41.2  40.0   Platelets 150 - 400 K/uL 211  223  191        Latest Ref Rng & Units 06/29/2022    3:18 AM 06/28/2022    2:54 AM 06/27/2022    2:55 AM  CMP  Glucose 70 - 99 mg/dL 562  130  865   BUN 6 - 20 mg/dL Creatinine 0.61 - 1.24 mg/dL 7.84  6.96  2.95   Sodium 135 - 145  mmol/L 138  135  136   Potassium 3.5 - 5.1 mmol/L 4.6  3.8  3.9   Chloride 98 - 111 mmol/L 96  98  101   CO2 22 - 32 mmol/L 33  29  25   Calcium 8.9 - 10.3 mg/dL 9.3  9.1  9.1   Total Protein 6.5 - 8.1 g/dL   7.2   Total Bilirubin 0.3 - 1.2 mg/dL   0.4   Alkaline Phos 38 - 126 U/L   80   AST 15 - 41 U/L   20   ALT 0 - 44 U/L   19      Imaging: CT CHEST WO CONTRAST  Result Date: 06/28/2022 CLINICAL DATA:  Pneumonia. EXAM: CT CHEST WITHOUT CONTRAST TECHNIQUE: Multidetector CT imaging of the chest was performed following the standard protocol without IV contrast. RADIATION DOSE REDUCTION: This exam was performed according to the departmental dose-optimization program which includes automated exposure  control, adjustment of the mA and/or kV according to patient size and/or use of iterative reconstruction technique. COMPARISON:  November 15, 2021. FINDINGS: Cardiovascular: No significant vascular findings. Normal heart size. No pericardial effusion. Mediastinum/Nodes: No enlarged mediastinal or axillary lymph nodes. Thyroid gland, trachea, and esophagus demonstrate no significant findings. Lungs/Pleura: No pneumothorax or pleural effusion is noted. Biapical scarring is noted. Emphysematous disease is noted bilaterally. Right lower lobe airspace opacity is noted concerning for pneumonia. Extensive bronchiectasis and bronchial wall thickening is seen throughout the left lung, particularly in the left lower lobe, with probable mucoid impaction. This was present on prior exam. Upper Abdomen: No acute abnormality. Musculoskeletal: No chest wall mass or suspicious bone lesions identified. IMPRESSION: Right lower lobe airspace opacity is noted concerning for pneumonia. There is continued extensive bronchiectasis and bronchial wall thickening seen throughout the left lung, particularly in the left lower lobe, with probable mucoid impaction. Biapical scarring. Emphysema (ICD10-J43.9). Electronically Signed   By:  Lupita Raider M.D.   On: 06/28/2022 12:05     Assessment/Plan:   Principal Problem:   CAP (community acquired pneumonia) Active Problems:   AIDS (acquired immune deficiency syndrome)   Chronic systolic CHF (congestive heart failure)   Pulmonary emphysema   Type 2 diabetes mellitus   S/P colostomy   Patient Summary: [name] is a 52 y.o. 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    #Acute Hypoxic Respiratory Failure  #Community Acquired Pneumonia vs Opportunistic Infection in the Setting of HIV/AIDs #Rhinovirus Infection #Chronic Bronchiectasis and Emphysema  #Hx of treated Pulmonary TB Continuing work up for opportunistic vs community acquired pneumonia. He is currently on 4L of nasal cannula in the room, and he states that he needs it because he gets short of breath when he coughs. On exam, wheezing has decreased compared to yesterday. Lab work shows his WBC count at 10.4, but curious if this is high for him given his history of semi-controlled HIV/AIDs. Gram stain did reveal gram negative rods, moderate gram positive cocci, and rare gram positive rods, which could be a sign of contamination. They are currently being re cultured for better growth. Respiratory panel revealed that he is positive for rhinovirus, so differential could also include a bacterial superinfection.  Pneumocystis PCR still pending. CT scan shows  right lower lobe airspace concerning for pneumonia, as well as extensive bronchiectasis and bronchial wall thickening seen throughout the left lung, with probably mucoid impaction.   Currently treating this as CAP, with broad general coverage with ceftriaxone and azithromycin, will narrow when we can rule out  opportunistic infection, or when cultures fully return. Believe source of his respiratory failure could be due to large mucoid impaction on the left lung. Order has been placed for chest physiotherapy. Acid fast sputums were canceled due to inadequate amount of sputum, will reorder. Symptomatically, pt does endorse he is feeling better with cough getting better, and he denies any fevers, chills.    Plan:  - Continue Ceftriaxone and Azithromycin for CAP coverage (Day 3) - Prednisone 40mg  daily  - Follow up PJP, and acid fast stain.  - Continue home Bactrim, Tivicay, and Symtuza  - Wean oxygen as tolerated - Cough control with Guaifenesin-Codeine  #AKI Creatinine has improved from 1.57 to 1.35.  AKI could have been in the setting of acute illness associated dehydration. He states he has no trouble eating while in the hospital, so encouraged him to continue drinking water. Will monitor with tomorrows labs.   Plan:  - Daily BMP  - Avoid nephrotoxic medications  - Consider renal ultrasound if creatinine does not improve.    #HFpEF Echo from 11/2021 shows a LVEF of 50%. Continues to be euvolemic.     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    Diet: Normal IVF: None,None VTE: Enoxaparin Code: Full PT/OT recs: None, none.    Dispo: Anticipated discharge to Home in 3 days pending medical management.   [name], MD PGY-1 Internal Medicine Resident Pager Number [phone] Please contact the on call pager after 5 pm and on weekends at [phone].

## 2022-06-29 NOTE — Transitions of Care (Post Inpatient/ED Visit) (Signed)
   06/29/2022  Name: [name] MRN: [id number] DOB: 1970-12-24  Today's TOC FU Call Status: Today's TOC FU Call Status:: Successful TOC FU Call Competed TOC FU Call Complete Date: 06/29/22  Attempted to reach the patient regarding the most recent Inpatient/ED visit. Patient still in the hospital Follow Up Plan: No further outreach attempts will be made at this time. We have been unable to contact the patient.  Signature Karena Addison, LPN [facility] Nurse Health Advisor Direct Dial [phone]

## 2022-06-29 NOTE — Progress Notes (Signed)
Update:   Cultures returned for normal oral flora; will discontinue antibiotics and manage conservatively. Will also await results of acid-fast stain.   Current leading cause of infection is most likely rhinovirus at this moment.

## 2022-06-29 NOTE — Consult Note (Addendum)
WOC Nurse ostomy consult note Stoma type/location: LLQ colostomy Patient consult received for ostomy supplies.  Patient is known to our service and supply order is placed as well as orders for ostomy care by the bedside staff.  1-piece pouching system [place] # [phone]) with skin barrier ring [place] # [phone]) is ordered to bedside.  WOC nursing team will not follow, but will remain available to this patient, the nursing and medical teams.  Please re-consult if needed.  Thank you for inviting [place] to participate in this patient's Plan of Care.  Ladona Mow, MSN, RN, CNS, GNP, Leda Min, [employer], [employer] phone:  [phone]

## 2022-06-29 NOTE — Telephone Encounter (Signed)
[facility] Patient Advocate Encounter  Patient's medications have been couriered to [facility] from [facility] and will be picked up .  [place], [employer]  Clearance Coots , CPhT Specialty Pharmacy Patient [facility] Phone: [phone] Fax:  [phone]

## 2022-06-30 ENCOUNTER — Ambulatory Visit: Payer: 59 | Admitting: Internal Medicine

## 2022-06-30 DIAGNOSIS — J189 Pneumonia, unspecified organism: Secondary | ICD-10-CM | POA: Diagnosis not present

## 2022-06-30 LAB — BASIC METABOLIC PANEL WITH GFR
Anion gap: 6 (ref 5–15)
BUN: 34 mg/dL — ABNORMAL HIGH (ref 6–20)
CO2: 30 mmol/L (ref 22–32)
Calcium: 9.4 mg/dL (ref 8.9–10.3)
Chloride: 98 mmol/L (ref 98–111)
Creatinine, Ser: 1.27 mg/dL — ABNORMAL HIGH (ref 0.61–1.24)
GFR, Estimated: >60 mL/min
Glucose, Bld: 185 mg/dL — ABNORMAL HIGH (ref 70–99)
Potassium: 4.4 mmol/L (ref 3.5–5.1)
Sodium: 134 mmol/L — ABNORMAL LOW (ref 135–145)

## 2022-06-30 LAB — GLUCOSE, CAPILLARY
Glucose-Capillary: 141 mg/dL — ABNORMAL HIGH (ref 70–99)
Glucose-Capillary: 147 mg/dL — ABNORMAL HIGH (ref 70–99)
Glucose-Capillary: 192 mg/dL — ABNORMAL HIGH (ref 70–99)
Glucose-Capillary: 355 mg/dL — ABNORMAL HIGH (ref 70–99)
Glucose-Capillary: 176 mg/dL — ABNORMAL HIGH (ref 70–99)

## 2022-06-30 LAB — CBC
HCT: 42.2 % (ref 39.0–52.0)
Hemoglobin: 13.9 g/dL (ref 13.0–17.0)
MCH: 32.9 pg (ref 26.0–34.0)
MCHC: 32.9 g/dL (ref 30.0–36.0)
MCV: 99.8 fL (ref 80.0–100.0)
Platelets: 212 10*3/uL (ref 150–400)
RBC: 4.23 MIL/uL (ref 4.22–5.81)
RDW: 12.6 % (ref 11.5–15.5)
WBC: 11.4 10*3/uL — ABNORMAL HIGH (ref 4.0–10.5)
nRBC: 0 % (ref 0.0–0.2)

## 2022-06-30 LAB — CULTURE, BLOOD (SINGLE): Special Requests: ADEQUATE

## 2022-06-30 NOTE — Progress Notes (Signed)
Subjective:   Summary: [name] is a 52 y.o. year old male currently admitted on the IMTS HD#3 for acute hypoxic respiratory failure in the setting of pneumonia.  Overnight Events: No overnight events  Pt seen at bedside this AM. He states that he continues to improve with regard to his cough which was his biggest symptom. He denies any fevers, chills, nausea, or vomiting.   Objective:  Vital signs in last 24 hours: Vitals:   06/30/22 0339 06/30/22 0800 06/30/22 0809 06/30/22 0811  BP: (!) 131/90 125/78    Pulse: 79 92 94   Resp: Temp: 97.6 F (36.4 C) 98.1 F (36.7 C)    TempSrc: Oral Oral    SpO2: 98% 92% 93% 93%  Weight:      Height:       Supplemental O2: Nasal Cannula 3L at 92%   Physical Exam:  Constitutional:Uncomfortable appearing male Cardiovascular: RRR, no murmurs, rubs or gallops Pulmonary/Chest: normal work of breathing on 3L of nasal cannula, decreased breath sounds heard throughout lung field. Filed Weights   06/27/22 0117 06/28/22 0505 06/29/22 0409  Weight: 86 kg 82.5 kg 81.1 kg     Intake/Output Summary (Last 24 hours) at 06/30/2022 1043 Last data filed at 06/30/2022 0600 Gross per 24 hour  Intake 250 ml  Output 2125 ml  Net -1875 ml    Net IO Since Admission: -5,252.29 mL [06/30/22 1043]  Pertinent Labs:    Latest Ref Rng & Units 06/30/2022    1:33 AM 06/29/2022    3:18 AM 06/28/2022    2:54 AM  CBC  WBC 4.0 - 10.5 K/uL 11.4  10.4  9.5   Hemoglobin 13.0 - 17.0 g/dL 16.1  09.6  04.5   Hematocrit 39.0 - 52.0 % 42.2  41.5  41.2   Platelets 150 - 400 K/uL 212  211  223        Latest Ref Rng & Units 06/30/2022    1:33 AM 06/29/2022    3:18 AM 06/28/2022    2:54 AM  CMP  Glucose 70 - 99 mg/dL 409  811  914   BUN 6 - 20 mg/dL 34  28  21   Creatinine 0.61 - 1.24 mg/dL 7.82  9.56  2.13   Sodium 135 - 145 mmol/L 134  138  135   Potassium 3.5 - 5.1 mmol/L 4.4  4.6  3.8   Chloride 98 - 111 mmol/L 98  96   98   CO2 22 - 32 mmol/L 30  33  29   Calcium 8.9 - 10.3 mg/dL 9.4  9.3  9.1      Imaging: No results found.   Assessment/Plan:   Principal Problem:   CAP (community acquired pneumonia) Active Problems:   AIDS (acquired immune deficiency syndrome)   Chronic systolic CHF (congestive heart failure)   Pulmonary emphysema   Type 2 diabetes mellitus   S/P colostomy   Patient Summary: [name] is a 52 y.o. 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    #Acute Hypoxic Respiratory Failure  #Rhinovirus Infection #Chronic Bronchiectasis and Emphysema  #Hx of treated Pulmonary TB  Workup for opportunistic vs community acquired pneumonia has so far been unrevealing. Do not believe he has an opportunistic infection, given no evidence on CT and continued clinical improvement. However, PJP testing still pending, and acid fast sputum also still pending.  Community acquired pneumonia also is less likely given normal sputum culture. Likely cause is severe rhinovirus infection. Antibiotics were discontinued yesterday.  He is currently saturating well at 3L at 93%. Given his severe emphysema, saturation goals for him would be from 88-92 on room air. During my examination, he was able to speak in full sentences without acute drop in oxygenation rate on 3L. His cough is also improving, current goal is help with the mucoid impaction with incentive spirometer, flutter valve, as well as chest PT.     Plan:  - Wean oxygen as tolerated; goal of 88-92% on RA - Daily use of incentive spirometer and flutter valve - Prednisone  daily  - Follow up PJP, and acid fast stain.  - Continue home Bactrim, Tivicay, and Symtuza  - Cough control with  Guaifenesin-Codeine - Chest Physiotherapy  #AKI Creatinine has improved from 1,35 to 1.27.  AKI could have been in the setting of acute illness associated dehydration. He states he has no trouble eating while in the hospital, so encouraged him to continue drinking water. Will monitor with tomorrows labs.   Plan:  - Daily BMP  - Avoid nephrotoxic medications  - Consider renal ultrasound if creatinine does not improve.    #HFpEF Echo from 11/2021 shows a LVEF of 50%. Continues to be euvolemic.     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    Diet: Normal IVF: None,None VTE: Enoxaparin Code: Full PT/OT recs: None, none.    Dispo: Anticipated discharge to Home in 3 days pending medical management.   [name], MD PGY-1 Internal Medicine Resident Pager Number [phone] Please contact the on call pager after 5 pm and on weekends at [phone].

## 2022-07-01 DIAGNOSIS — J189 Pneumonia, unspecified organism: Secondary | ICD-10-CM | POA: Diagnosis not present

## 2022-07-01 LAB — CBC
HCT: 41.7 % (ref 39.0–52.0)
Hemoglobin: 13.9 g/dL (ref 13.0–17.0)
MCH: 33.1 pg (ref 26.0–34.0)
MCHC: 33.3 g/dL (ref 30.0–36.0)
MCV: 99.3 fL (ref 80.0–100.0)
Platelets: 234 10*3/uL (ref 150–400)
RBC: 4.2 MIL/uL — ABNORMAL LOW (ref 4.22–5.81)
RDW: 12.5 % (ref 11.5–15.5)
WBC: 10.4 10*3/uL (ref 4.0–10.5)
nRBC: 0 % (ref 0.0–0.2)

## 2022-07-01 LAB — BASIC METABOLIC PANEL
Anion gap: 9 (ref 5–15)
BUN: 41 mg/dL — ABNORMAL HIGH (ref 6–20)
CO2: 31 mmol/L (ref 22–32)
Calcium: 9.6 mg/dL (ref 8.9–10.3)
Chloride: 94 mmol/L — ABNORMAL LOW (ref 98–111)
Creatinine, Ser: 1.57 mg/dL — ABNORMAL HIGH (ref 0.61–1.24)
GFR, Estimated: 53 mL/min — ABNORMAL LOW (ref >60)
Glucose, Bld: 136 mg/dL — ABNORMAL HIGH (ref 70–99)
Potassium: 4.7 mmol/L (ref 3.5–5.1)
Sodium: 134 mmol/L — ABNORMAL LOW (ref 135–145)

## 2022-07-01 LAB — GLUCOSE, CAPILLARY
Glucose-Capillary: 164 mg/dL — ABNORMAL HIGH (ref 70–99)
Glucose-Capillary: 161 mg/dL — ABNORMAL HIGH (ref 70–99)
Glucose-Capillary: 289 mg/dL — ABNORMAL HIGH (ref 70–99)
Glucose-Capillary: 153 mg/dL — ABNORMAL HIGH (ref 70–99)

## 2022-07-01 LAB — CULTURE, BLOOD (SINGLE): Culture: NO GROWTH

## 2022-07-01 LAB — ACID FAST SMEAR (AFB, MYCOBACTERIA): Acid Fast Smear: NEGATIVE

## 2022-07-01 MED ORDER — ORAL CARE MOUTH RINSE: 15.0000 mL | OROMUCOSAL | Status: DC | PRN

## 2022-07-01 MED ORDER — SIMETHICONE 80 MG PO CHEW
80.0000 mg | CHEWABLE_TABLET | Freq: Four times a day (QID) | ORAL | Status: DC | PRN
  Administered 2022-07-01: 80 mg via ORAL
  Filled 2022-07-01: qty 1

## 2022-07-01 NOTE — Progress Notes (Signed)
Nurse requested Mobility Specialist to perform oxygen saturation test with pt which includes removing pt from oxygen both at rest and while ambulating.  Below are the results from that testing.     Patient Saturations on Room Air at Rest = spO2 92%  Patient Saturations on Room Air while Ambulating = sp02 84% .  Rested and performed pursed lip breathing for 1 minute with sp02 at 84%.  Patient Saturations on 2 Liters of oxygen while Ambulating = sp02 91%  At end of testing pt left in room on 2  Liters of oxygen.  Reported results to nurse.

## 2022-07-01 NOTE — TOC Initial Note (Signed)
Transition of Care (TOC) - Initial/Assessment Note  Donn Pierini RN, BSN Transitions of Care Unit 4E- RN Case Manager See Treatment Team for direct phone #   Patient Details  Name: [name] MRN: [id number] Date of Birth: [birth year]  Transition of Care [facility]) CM/SW Contact:    Darrold Span, RN Phone Number: 07/01/2022, 3:01 PM  Clinical Narrative:                 Notified by MD that pt will need home 02, order has been placed, qualifying note done.  Home 02 referral called to [employer], transport 02 will be delivered to room for transport home, [facility] will follow up with pt post discharge to delivery home concentrator/supplies.   TOC to continue to follow for any further transition needs.   Expected Discharge Plan: Home/Self Care Barriers to Discharge: Continued Medical Work up   Patient Goals and CMS Choice Patient states their goals for this hospitalization and ongoing recovery are:: return home   Choice offered to / list presented to : Patient      Expected Discharge Plan and Services   Discharge Planning Services: CM Consult Post Acute Care Choice: Durable Medical Equipment Living arrangements for the past 2 months: Apartment                 DME Arranged: Oxygen DME Agency: [facility] Date DME Agency Contacted: 07/01/22 Time DME Agency Contacted: 1501 Representative spoke with at DME Agency: Zollie Beckers HH Arranged: NA HH Agency: NA        Prior Living Arrangements/Services Living arrangements for the past 2 months: Apartment Lives with:: Relatives Patient language and need for interpreter reviewed:: Yes Do you feel safe going back to the place where you live?: Yes      Need for Family Participation in Patient Care: Yes (Comment) Care giver support system in place?: Yes (comment)   Criminal Activity/Legal Involvement Pertinent to Current Situation/Hospitalization: No - Comment as needed  Activities of Daily Living      Permission  Sought/Granted                  Emotional Assessment       Orientation: : Oriented to Self, Oriented to Place, Oriented to  Time, Oriented to Situation Alcohol / Substance Use: Not Applicable Psych Involvement: No (comment)  Admission diagnosis:  CAP (community acquired pneumonia) [J18.9] Pneumonia of both lungs due to infectious organism, unspecified part of lung [J18.9] Patient Active Problem List   Diagnosis Date Noted   CAP (community acquired pneumonia) 06/27/2022   Adenopathy 12/07/2021   Acute on chronic combined systolic and diastolic HF (heart failure) 12/07/2021   Bacteremia 11/17/2021   Listeriosis    Sepsis 11/15/2021   Atopic dermatitis 09/30/2021   Allergy 09/30/2021   Steroid-induced hyperglycemia 09/05/2021   COPD exacerbation 08/23/2021   Bronchiectasis without acute exacerbation 04/22/2021   Pruritic dermatitis 04/03/2021   S/P colostomy 02/06/2021   History of tuberculosis 11/01/2020   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   CKD (chronic kidney disease) stage 2, GFR 60-89 ml/min 11/01/2020   Type 2 diabetes mellitus 05/17/2019   Genital warts 05/06/2018   Pulmonary emphysema    Condyloma 01/04/2017   Chronic systolic CHF (congestive heart failure) 04/24/2016   Normochromic normocytic anemia 02/25/2016   Pneumonia 02/25/2016   AIDS (acquired immune deficiency syndrome)    S/P ORIF (open reduction internal fixation) fracture 03/21/2014   HIV disease 09/18/2013   PCP:  Marolyn Haller, MD  Pharmacy:   [facility] [phone] - [place], [place] - [address] AT [facility] [address] [facility] [address] [place] [address] Phone: [phone] Fax: [phone]  [facility] [address] [place] [address] Phone: [phone] Fax: [phone]  [facility] [place], [place] - [address] [place] [address] Phone:  [phone] Fax: [phone]  [id number] - [place], [place] - [address] AT [facility] OF [address] [place] [address] Phone: [phone] Fax: [phone]  [facility] [address] [place] [address] Phone: [phone] Fax: [phone]     Social Determinants of Health (SDOH) Social History: SDOH Screenings   Depression (PHQ2-9): Low Risk  (06/23/2022)  Tobacco Use: Medium Risk (06/27/2022)   SDOH Interventions:     Readmission Risk Interventions     No data to display

## 2022-07-01 NOTE — Progress Notes (Signed)
Mobility Specialist: Progress Note   07/01/22 1131  Mobility  Activity Ambulated with assistance in hallway  Level of Assistance Standby assist, set-up cues, supervision of patient - no hands on  Assistive Device None  Distance Ambulated (ft) 470 ft  Activity Response Tolerated well  Mobility Referral Yes  $Mobility charge 1 Mobility   Pre-Mobility on RA: 127/86 (101) BP, 92% SpO2 During Mobility:    On RA: 84% SpO2    On 2 L/min [place]: 92% SpO2 Post-Mobility on 2 L/min [place]: 115 HR, 91% SpO2  Pt received in the bed and agreeable to mobility. Independent with mobility and standby during ambulation for line management. No c/o throughout. Pt desat to 84% on RA during ambulation requiring 2 L/min [place] to maintain >90% throughout. Pt to BR per request and then back to bed after session. Pt has call bell and phone at his side.   [place] Convery Mobility Specialist Please contact via SecureChat or Rehab office at [phone]

## 2022-07-01 NOTE — Progress Notes (Signed)
Oxygen tank delivered to pts room for transport home.  Reviewed oxygen use with pt - pt verbalized and demonstrated understanding.  Oxygen tank kept at bedside with pt belongings.  Oxygen to be set up for home delivery.

## 2022-07-01 NOTE — Progress Notes (Signed)
Subjective:   Summary: [name] is a 52 y.o. year old male currently admitted on the IMTS HD#3 for acute hypoxic respiratory failure in the setting of pneumonia.  Overnight Events: No overnight events  Pt seen at bedside this AM. His cough continues to improve, and denies any fevers, nausea, vomiting, and chills.   Objective:  Vital signs in last 24 hours: Vitals:   06/30/22 2128 07/01/22 0427 07/01/22 0429 07/01/22 0743  BP:  117/79  119/77  Pulse: 95 87  83  Resp: Temp:  98.2 F (36.8 C)  98.1 F (36.7 C)  TempSrc:  Oral  Oral  SpO2: 94% 95%  98%  Weight:   82.4 kg   Height:       Supplemental O2: Nasal Cannula 2.5L at 92%   Physical Exam:  Constitutional:Uncomfortable appearing male Cardiovascular: RRR, no murmurs, rubs or gallops Pulmonary/Chest: normal work of breathing on 2.5L of nasal cannula, decreased breath sounds heard throughout lung field, mild wheezing heard through lung field Abdominal: Colostomy bag in place [facility] Weights   06/28/22 0505 06/29/22 0409 07/01/22 0429  Weight: 82.5 kg 81.1 kg 82.4 kg     Intake/Output Summary (Last 24 hours) at 07/01/2022 0842 Last data filed at 07/01/2022 0743 Gross per 24 hour  Intake --  Output 700 ml  Net -700 ml    Net IO Since Admission: -5,952.29 mL [07/01/22 0842]  Pertinent Labs:    Latest Ref Rng & Units 07/01/2022    1:34 AM 06/30/2022    1:33 AM 06/29/2022    3:18 AM  CBC  WBC 4.0 - 10.5 K/uL 10.4  11.4  10.4   Hemoglobin 13.0 - 17.0 g/dL 16.1  09.6  04.5   Hematocrit 39.0 - 52.0 % 41.7  42.2  41.5   Platelets 150 - 400 K/uL 234  212  211        Latest Ref Rng & Units 07/01/2022    1:34 AM 06/30/2022    1:33 AM 06/29/2022    3:18 AM  CMP  Glucose 70 - 99 mg/dL 409  811  914   BUN 6 - 20 mg/dL 41  34  28   Creatinine 0.61 - 1.24 mg/dL 7.82  9.56  2.13   Sodium 135 - 145 mmol/L 134  134  138   Potassium 3.5 - 5.1 mmol/L 4.7  4.4  4.6   Chloride 98 - 111  mmol/L 94  98  96   CO2 22 - 32 mmol/L 31  30  33   Calcium 8.9 - 10.3 mg/dL 9.6  9.4  9.3      Imaging: No results found.   Assessment/Plan:   Principal Problem:   CAP (community acquired pneumonia) Active Problems:   AIDS (acquired immune deficiency syndrome)   Chronic systolic CHF (congestive heart failure)   Pulmonary emphysema   Type 2 diabetes mellitus   S/P colostomy   Patient Summary: [name] is a 52 y.o. 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    #Acute Hypoxic Respiratory Failure  #Rhinovirus Infection #Chronic Bronchiectasis and Emphysema  #Hx of treated Pulmonary TB Workup for  opportunistic vs community acquired pneumonia has so far been unrevealing. Do not believe he has an opportunistic infection, given no evidence on CT and continued clinical improvement. However, PJP testing still pending, and acid fast sputum also still pending.  Community acquired pneumonia also is less likely given normal sputum culture. Likely cause is severe rhinovirus infection. Antibiotics were discontinued     Given his severe emphysema, saturation goals for him would be from 88-92 on room air. Sent messages to nurse, mobility specialist, as well as physical therapist. During my examination, he was able to speak in full sentences without acute drop in oxygenation rate on 2.5L. His cough is also improving, current goal is help with the mucoid impaction with incentive spirometer, flutter valve, as well as chest PT.   Currently waiting on acid fast sputum as well as PJP. Discussed with [facility], and seems like they recevied sample yesterday around 3PM, and may release results tomorrow. PJP sample was sent to [facility] via [facility], and discussion with [facility] clinic revealed that sample was recevied this morning and will take 24-48 hours for evaluation.    Plan:  - Wean oxygen as tolerated; goal of 88-92% on RA - Daily use of incentive spirometer and flutter valve - Prednisone  daily  - Follow up PJP, and acid fast stain.  - Continue home Bactrim, Tivicay, and Symtuza  - Cough control with Guaifenesin-Codeine - Chest Physiotherapy  #AKI Creatinine has increased from 1.27 to 1.57.  AKI could have been in the setting of acute illness associated dehydration. He states he has no trouble eating while in the hospital, so encouraged him to continue drinking water. Will monitor with tomorrows labs.   Plan:  - Daily BMP  - Avoid nephrotoxic medications  - Consider renal ultrasound if creatinine does not improve.    #HFpEF Echo from 11/2021 shows a LVEF of 50%. Continues to be euvolemic.     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    Diet: Normal IVF: None,None VTE: Enoxaparin Code: Full PT/OT recs: None, none.    Dispo: Anticipated discharge to Home in 3 days pending medical management.   [name], MD PGY-1 Internal Medicine Resident Pager Number [phone] Please contact the on call pager after 5 pm and on weekends at [phone].

## 2022-07-02 ENCOUNTER — Other Ambulatory Visit (HOSPITAL_COMMUNITY): Payer: Self-pay

## 2022-07-02 LAB — CBC
HCT: 43.3 % (ref 39.0–52.0)
Hemoglobin: 14.4 g/dL (ref 13.0–17.0)
MCH: 32.9 pg (ref 26.0–34.0)
MCHC: 33.3 g/dL (ref 30.0–36.0)
MCV: 98.9 fL (ref 80.0–100.0)
Platelets: 252 10*3/uL (ref 150–400)
RBC: 4.38 MIL/uL (ref 4.22–5.81)
RDW: 12.4 % (ref 11.5–15.5)
WBC: 8.8 10*3/uL (ref 4.0–10.5)
nRBC: 0 % (ref 0.0–0.2)

## 2022-07-02 LAB — BASIC METABOLIC PANEL
Anion gap: 10 (ref 5–15)
BUN: 38 mg/dL — ABNORMAL HIGH (ref 6–20)
CO2: 30 mmol/L (ref 22–32)
Calcium: 9.8 mg/dL (ref 8.9–10.3)
Chloride: 93 mmol/L — ABNORMAL LOW (ref 98–111)
Creatinine, Ser: 1.6 mg/dL — ABNORMAL HIGH (ref 0.61–1.24)
GFR, Estimated: 52 mL/min — ABNORMAL LOW (ref >60)
Glucose, Bld: 186 mg/dL — ABNORMAL HIGH (ref 70–99)
Potassium: 4.8 mmol/L (ref 3.5–5.1)
Sodium: 133 mmol/L — ABNORMAL LOW (ref 135–145)

## 2022-07-02 LAB — CULTURE, BLOOD (SINGLE): Culture: NO GROWTH

## 2022-07-02 LAB — GLUCOSE, CAPILLARY: Glucose-Capillary: 121 mg/dL — ABNORMAL HIGH (ref 70–99)

## 2022-07-02 MED ORDER — ANORO ELLIPTA 62.5-25 MCG/ACT IN AEPB: 1.0000 | INHALATION_SPRAY | Freq: Every day | RESPIRATORY_TRACT | 0 refills | Status: DC

## 2022-07-02 NOTE — Progress Notes (Signed)
Patient being discharged home with oxygen.  Patient to be transported by family.  Patient discharge education completed with interpreter services.  IV's removed with the catheters intact.  Discharge, prescription, and oxygen education given to the patient who verbalized understanding.

## 2022-07-02 NOTE — TOC Transition Note (Signed)
Transition of Care (TOC) - CM/SW Discharge Note Donn Pierini RN, BSN Transitions of Care Unit 4E- RN Case Manager See Treatment Team for direct phone #   Patient Details  Name: [name] MRN: [id number] Date of Birth: [birth year]  Transition of Care [facility]) CM/SW Contact:  Darrold Span, RN Phone Number: 07/02/2022, 12:34 PM   Clinical Narrative:    Pt stable for transition home today, home 02 has been set up with [employer], portable 02 at the bedside for transport home. Pt to call [employer] once he is home so they can deliver home concentrator and 02 supplies.   No further TOC needs noted. Pt to f/u per AVS instructions.    Final next level of care: Home/Self Care Barriers to Discharge: Barriers Resolved   Patient Goals and CMS Choice   Choice offered to / list presented to : Patient  Discharge Placement                 Home        Discharge Plan and Services Additional resources added to the After Visit Summary for     Discharge Planning Services: CM Consult Post Acute Care Choice: Durable Medical Equipment          DME Arranged: Oxygen DME Agency: [facility] Date DME Agency Contacted: 07/01/22 Time DME Agency Contacted: 1501 Representative spoke with at DME Agency: Zollie Beckers HH Arranged: NA HH Agency: NA        Social Determinants of Health (SDOH) Interventions SDOH Screenings   Depression (PHQ2-9): Low Risk  (06/23/2022)  Tobacco Use: Medium Risk (06/27/2022)     Readmission Risk Interventions    07/02/2022   12:34 PM  Readmission Risk Prevention Plan  Post Dischage Appt Complete  Medication Screening Complete  Transportation Screening Complete

## 2022-07-02 NOTE — Discharge Summary (Addendum)
Name: [name] MRN: [id number] DOB: 1970/06/06 52 y.o. PCP: Marolyn Haller, MD  Date of Admission: 06/27/2022  1:11 AM Date of Discharge: 07/02/2022 Attending Physician: Reymundo Poll, MD  Discharge Diagnosis: 1. Principal Problem:   CAP (community acquired pneumonia) Active Problems:   AIDS (acquired immune deficiency syndrome)   Chronic systolic CHF (congestive heart failure)   Pulmonary emphysema   Type 2 diabetes mellitus   S/P colostomy    Discharge Medications: Allergies as of 07/02/2022       Reactions   Metformin And Related Swelling, Rash   Pt doesn't remember this allergy, but does state he gets rash. Unsure if related to medication.        Medication List     STOP taking these medications    ibuprofen 200 MG tablet Commonly known as: ADVIL   predniSONE 20 MG tablet Commonly known as: DELTASONE       TAKE these medications    Anoro Ellipta 62.5-25 MCG/ACT Aepb Generic drug: umeclidinium-vilanterol Inhale 1 puff into the lungs daily.   aspirin EC 81 MG tablet Take 1 tablet (81 mg total) by mouth daily. Swallow whole.   cetirizine 5 MG tablet Commonly known as: ZYRTEC Take 1 tablet (5 mg total) by mouth daily.   guaiFENesin-dextromethorphan 100-10 MG/5ML syrup Commonly known as: ROBITUSSIN DM Take 5 mLs by mouth every 4 (four) hours as needed for cough.   Robafen Mucus/Chest Congestion 100 MG/5ML liquid Generic drug: guaiFENesin Take 5 mLs by mouth every 4 (four) hours as needed for cough or to loosen phlegm.   rosuvastatin 20 MG tablet Commonly known as: Crestor Take 1 tablet (20 mg total) by mouth daily.   sulfamethoxazole-trimethoprim 800-160 MG tablet Commonly known as: BACTRIM DS Take 1 tablet by mouth daily.   Symtuza 800-150-200-10 MG Tabs Generic drug: Darunavir-Cobicistat-Emtricitabine-Tenofovir Alafenamide Take 1 tablet by mouth daily with breakfast.   Tivicay 50 MG tablet Generic drug: dolutegravir Take  1 tablet (50 mg total) by mouth daily.   triamcinolone 0.1%-Aquaphor equivlanet 1:1 ointment mixture Apply topically 2 (two) times daily.   triamcinolone ointment 0.1 % Commonly known as: KENALOG Apply 1 Application topically 2 (two) times daily.   vitamin D3 25 MCG tablet Commonly known as: CHOLECALCIFEROL Take 1 tablet (1,000 Units total) by mouth daily.               Durable Medical Equipment  (From admission, onward)           Start     Ordered   07/01/22 1355  For home use only DME oxygen  Once       Question Answer Comment  Length of Need 6 Months   Mode or (Route) Nasal cannula   Liters per Minute 2   Frequency Continuous (stationary and portable oxygen unit needed)   Oxygen conserving device Yes   Oxygen delivery system Gas      07/01/22 1358            Disposition and follow-up:   [name] was [facility] from [facility] in Good condition.  At the hospital follow up visit please address:  1.    Acute on Chronic Respiratory Failure: Please follow up Pneumocystis testing and assess if pt still requires supplemental oxygen. Also please ensure he has his inhalers.   2.  Labs / imaging needed at time of follow-up: BMP  3.  Pending labs/ test needing follow-up: Pneumocystis PCR  Follow-up Appointments:  Follow-up Information  [employer] [facility] Follow up.   Why: Home 02 arranged- tranport 02 will be delivered to room prior to discharge, they will schedule time to deliver home equipment post discharge Contact information: [address] [place] [address] [phone]         Quincy Simmonds, MD Follow up on 07/09/2022.   Specialty: Internal Medicine Why: You have an appointment with Dr. Elaina Pattee on 07/09/2022 at 9:45AM Contact information: [address] [place] [address] [phone]                 Hospital Course by problem list:  #Acute Hypoxic Respiratory Failure  #Rhinovirus  Infection  #COPD Exacerbation  Pt presented with worsening shortness of breath in the setting of severe COPD and emphysema. Chest Xray showed multifocal bronchiectasis with scattered opacities in the left lung, concerning for pneumonia, as well as blunting of the left costophrenic angle, with possible trace left pleural effusion. CT chest showed a right lower lobe airspace opacity concerning for pneumonia. Continued extensive bronchiectasis and bronchial wall thickening, with probably mucoid impaction. Initial concern was for CAP vs Opportunistic infection in the setting of not-optimally controlled HIV. He was started on Ceftriaxone and Azithromycin for CAP coverage for three days. Sputum cultures also showed normal oral flora, and acid fast stain was negative. Antibiotics were discontinued, as respiratory failure was likely due COPD Exacerbation as well as mucus plugging. His cough was controlled with Guaifenesin. Pt's oxygen requirements eventually decreased from 5L to 2L. While ambulating, his saturation dropped to 84% (goal is 88-92%), and was recovered when put on 2L of oxygen. He will likely need to use oxygen going forward when ambulating, and has already been delivered to his room. Refilled his inhaler of Anoro Ellipta.    #Hx of HIV/AIDs Initial concern was for opportunistic infection in the setting of HIV/AIDs. Last CD4 testing was done on 06/23/22 which showed a CD4 T Cell Abs at 93, and HIV RNA increased at 104.   #CKD Stage 2 Creatinine hovered from 1.3 to 1.6. GFR 52.  Will need to be repeated in the outpatient setting.   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   Discharge Exam:   BP 122/86 (BP Location: Left Arm)   Pulse 81   Temp 98.2 F (36.8 C) (Oral)   Resp 17   Ht  (1.727 m)   Wt 82.1 kg   SpO2 93%   BMI  27.52 kg/m  Discharge exam:  Constitutional:Uncomfortable appearing male Cardiovascular: RRR, no murmurs, rubs or gallops Pulmonary/Chest: normal work of breathing on 2.5L of nasal cannula, decreased breath sounds heard throughout lung field, mild wheezing heard through lung field Abdominal: Colostomy bag in place   Pertinent Labs, Studies, and Procedures:     Latest Ref Rng & Units 07/02/2022    1:12 AM 07/01/2022    1:34 AM 06/30/2022    1:33 AM  BMP  Glucose 70 - 99 mg/dL 578  469  629   BUN 6 - 20 mg/dL 38  41  34   Creatinine 0.61 - 1.24 mg/dL 5.28  4.13  2.44   Sodium 135 - 145 mmol/L 133  134  134   Potassium 3.5 - 5.1 mmol/L 4.8  4.7  4.4   Chloride 98 - 111 mmol/L 93  94  98  CO2 22 - 32 mmol/L 30  31  30    Calcium 8.9 - 10.3 mg/dL 9.8  9.6  9.4        Latest Ref Rng & Units 07/02/2022    1:12 AM 07/01/2022    1:34 AM 06/30/2022    1:33 AM  CBC  WBC 4.0 - 10.5 K/uL 8.8  10.4  11.4   Hemoglobin 13.0 - 17.0 g/dL 16.1  09.6  04.5   Hematocrit 39.0 - 52.0 % 43.3  41.7  42.2   Platelets 150 - 400 K/uL 252  234  212      Discharge Instructions: Discharge Instructions     Call MD for:  persistant nausea and vomiting   Complete by: As directed    Call MD for:  severe uncontrolled pain   Complete by: As directed    Call MD for:  temperature >100.4   Complete by: As directed    Diet - low sodium heart healthy   Complete by: As directed    Diet - low sodium heart healthy   Complete by: As directed    Increase activity slowly   Complete by: As directed    Increase activity slowly   Complete by: As directed        Signed: [name], MD 07/02/2022, 10:47 AM   Pager: [phone]

## 2022-07-02 NOTE — Discharge Instructions (Addendum)
It was a pleasure taking care of you! You were brought in for an infection in your lungs secondary to a Rhinovirus. We are sending you with oxygen, please use it as you need. I am also making you an appointment for follow up in our clinic at 4/25 at 9:45 with Dr. Elaina Pattee.  Take Care!

## 2022-07-03 ENCOUNTER — Telehealth: Payer: Self-pay

## 2022-07-03 ENCOUNTER — Telehealth: Payer: Self-pay | Admitting: *Deleted

## 2022-07-03 LAB — ACID FAST SMEAR (AFB, MYCOBACTERIA)
Acid Fast Smear: NEGATIVE
Acid Fast Smear: NEGATIVE

## 2022-07-03 NOTE — Transitions of Care (Post Inpatient/ED Visit) (Signed)
   07/03/2022  Name: [name] MRN: [id number] DOB: 01/01/1971  Today's TOC FU Call Status: Today's TOC FU Call Status:: Successful TOC FU Call Competed TOC FU Call Complete Date: 07/03/22  Transition Care Management Follow-up Telephone Call Date of Discharge: 07/02/22 Discharge Facility: [facility]) Type of Discharge: Inpatient Admission Primary Inpatient Discharge Diagnosis:: pneumonia How have you been since you were released from the hospital?: Better Any questions or concerns?: No  Items Reviewed: Did you receive and understand the discharge instructions provided?: Yes Medications obtained and verified?: Yes (Medications Reviewed) Any new allergies since your discharge?: No Dietary orders reviewed?: Yes Do you have support at home?: Yes People in Home: child(ren), adult  Home Care and Equipment/Supplies: Were Home Health Services Ordered?: NA Any new equipment or medical supplies ordered?: Yes Name of Medical supply agency?: [employer] Were you able to get the equipment/medical supplies?: Yes Do you have any questions related to the use of the equipment/supplies?: No  Functional Questionnaire: Do you need assistance with bathing/showering or dressing?: No Do you need assistance with meal preparation?: No Do you need assistance with eating?: No Do you have difficulty maintaining continence: No Do you need assistance with getting out of bed/getting out of a chair/moving?: No Do you have difficulty managing or taking your medications?: No  Follow up appointments reviewed: PCP Follow-up appointment confirmed?: Yes Date of PCP follow-up appointment?: 07/09/22 Follow-up Provider: Quincy Simmonds Specialist [facility] Follow-up appointment confirmed?: NA Do you need transportation to your follow-up appointment?: No Do you understand care options if your condition(s) worsen?: Yes-patient verbalized understanding    SIGNATURE Karena Addison, LPN [facility] Nurse Health  Advisor Direct Dial [phone]

## 2022-07-03 NOTE — Telephone Encounter (Signed)
Patient walked in c/o not being able to get his medication prescribed at discharge. Patient stated  pharmacy said that they did not have a presecretion for the [employer].  Call to Pharmacy after long wait on hold.  Anoro Ellipta was ordered by Pharmacy and should come in after 4:30 PM today for patient to pick up.  Patient was informed through the [facility] interpreter. Will go back to Pharmacy this pm to pick up the medication.

## 2022-07-08 ENCOUNTER — Other Ambulatory Visit (HOSPITAL_COMMUNITY): Payer: Self-pay

## 2022-07-09 ENCOUNTER — Other Ambulatory Visit: Payer: Self-pay | Admitting: Pharmacist

## 2022-07-09 ENCOUNTER — Encounter: Payer: 59 | Admitting: Student

## 2022-07-09 ENCOUNTER — Other Ambulatory Visit (HOSPITAL_COMMUNITY): Payer: Self-pay

## 2022-07-09 DIAGNOSIS — B20 Human immunodeficiency virus [HIV] disease: Secondary | ICD-10-CM

## 2022-07-10 ENCOUNTER — Other Ambulatory Visit (HOSPITAL_COMMUNITY): Payer: Self-pay

## 2022-07-10 MED ORDER — SULFAMETHOXAZOLE-TRIMETHOPRIM 800-160 MG PO TABS
1.0000 | ORAL_TABLET | Freq: Every day | ORAL | 11 refills | Status: DC
Start: 2022-07-10 — End: 2023-02-15
  Filled 2022-07-10: qty 30, 30d supply, fill #0
  Filled 2022-08-03: qty 30, 30d supply, fill #1
  Filled 2022-11-02 (×2): qty 30, 30d supply, fill #2
  Filled 2022-12-02 (×2): qty 30, 30d supply, fill #3
  Filled 2023-01-20: qty 30, 30d supply, fill #4
  Filled 2023-02-04: qty 30, 30d supply, fill #5

## 2022-07-27 ENCOUNTER — Other Ambulatory Visit (HOSPITAL_COMMUNITY): Payer: Self-pay

## 2022-07-27 ENCOUNTER — Ambulatory Visit (INDEPENDENT_AMBULATORY_CARE_PROVIDER_SITE_OTHER): Payer: 59 | Admitting: Internal Medicine

## 2022-07-27 ENCOUNTER — Other Ambulatory Visit: Payer: Self-pay

## 2022-07-27 ENCOUNTER — Encounter: Payer: Self-pay | Admitting: Internal Medicine

## 2022-07-27 VITALS — BP 122/78 | HR 93 | Temp 97.5°F | Ht 67.0 in | Wt 189.0 lb

## 2022-07-27 DIAGNOSIS — B20 Human immunodeficiency virus [HIV] disease: Secondary | ICD-10-CM | POA: Diagnosis not present

## 2022-07-27 MED ORDER — ANORO ELLIPTA 62.5-25 MCG/ACT IN AEPB
1.0000 | INHALATION_SPRAY | Freq: Every day | RESPIRATORY_TRACT | 0 refills | Status: DC
  Filled 2022-07-27: qty 60, 30d supply, fill #0

## 2022-07-27 NOTE — Progress Notes (Signed)
RFV: follow up for hiv disease, recent hospitalization for CAP last month  Patient ID: [name], male   DOB: 11-13-1970, 52 y.o.   MRN: [id number]  HPI [name] is a 52yo M with advanced hiv disease, on tivicay-symtuza, plus on bactrim oi proph, CD 4 count of 94/VL 104 (in April). He reports taking medications but needs refills. Since we last saw him, he was hospitalized for pneumonia . He feels that he is recovering. Outpatient Encounter Medications as of 07/27/2022  Medication Sig   aspirin EC 81 MG tablet Take 1 tablet (81 mg total) by mouth daily. Swallow whole. (Patient not taking: Reported on 06/27/2022)   cetirizine (ZYRTEC) 5 MG tablet Take 1 tablet (5 mg total) by mouth daily.   Darunavir-Cobicistat-Emtricitabine-Tenofovir Alafenamide (SYMTUZA) 800-150-200-10 MG TABS Take 1 tablet by mouth daily with breakfast.   dolutegravir (TIVICAY) 50 MG tablet Take 1 tablet (50 mg total) by mouth daily.   guaiFENesin 200 MG/10ML LIQD Take 5 mLs by mouth every 4 (four) hours as needed for cough or to loosen phlegm. (Patient not taking: Reported on 03/17/2022)   guaiFENesin-dextromethorphan (ROBITUSSIN DM) 100-10 MG/5ML syrup Take 5 mLs by mouth every 4 (four) hours as needed for cough. (Patient not taking: Reported on 06/27/2022)   rosuvastatin (CRESTOR) 20 MG tablet Take 1 tablet (20 mg total) by mouth daily. (Patient not taking: Reported on 06/27/2022)   sulfamethoxazole-trimethoprim (BACTRIM DS) 800-160 MG tablet Take 1 tablet by mouth daily.   triamcinolone 0.1%-Aquaphor equivlanet 1:1 ointment mixture Apply topically 2 (two) times daily. (Patient not taking: Reported on 06/27/2022)   triamcinolone ointment (KENALOG) 0.1 % Apply 1 Application topically 2 (two) times daily.   umeclidinium-vilanterol (ANORO ELLIPTA) 62.5-25 MCG/ACT AEPB Inhale 1 puff into the lungs daily.   vitamin D3 (CHOLECALCIFEROL) 25 MCG tablet Take 1 tablet (1,000 Units total) by mouth daily.   No facility-administered  encounter medications on file as of 07/27/2022.     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   CKD (chronic kidney disease) stage 2, GFR 60-89 ml/min 11/01/2020   Type 2 diabetes mellitus ([name]) 05/17/2019   Genital warts 05/06/2018   Pulmonary emphysema ([name])    Condyloma 01/04/2017   Chronic systolic CHF (congestive heart failure) ([name]) 04/24/2016   Normochromic normocytic anemia 02/25/2016   Pneumonia 02/25/2016   AIDS (acquired immune deficiency syndrome) ([name])    S/P ORIF (open reduction internal fixation) fracture 03/21/2014   HIV disease ([name]) 09/18/2013     Health Maintenance Due  Topic Date Due   FOOT EXAM  Never done   OPHTHALMOLOGY EXAM  Never done   COVID-19 Vaccine (2 - Pfizer risk series) 01/26/2020   Diabetic kidney evaluation - Urine ACR  11/02/2021     Review of Systems Review of Systems  Constitutional: Negative for fever, chills, diaphoresis, activity change, appetite change, fatigue and unexpected weight change.  HENT: Negative for congestion, sore throat, rhinorrhea, sneezing, trouble swallowing and sinus  pressure.  Eyes: Negative for photophobia and visual disturbance.  Respiratory: Negative for cough, chest tightness, shortness of breath, wheezing and stridor.  Cardiovascular: Negative for chest pain, palpitations and leg swelling.  Gastrointestinal: Negative for nausea,  vomiting, abdominal pain, diarrhea, constipation, blood in stool, abdominal distention and anal bleeding.  Genitourinary: Negative for dysuria, hematuria, flank pain and difficulty urinating.  Musculoskeletal: Negative for myalgias, back pain, joint swelling, arthralgias and gait problem.  Skin: Negative for color change, pallor, rash and wound.  Neurological: Negative for dizziness, tremors, weakness and light-headedness.  Hematological: Negative for adenopathy. Does not bruise/bleed easily.  Psychiatric/Behavioral: Negative for behavioral problems, confusion, sleep disturbance, dysphoric mood, decreased concentration and agitation.   Physical Exam   BP 122/78   Pulse 93   Temp (!) 97.5 F (36.4 C) (Oral)   Ht 5\' 7"  (1.702 m)   Wt 189 lb (85.7 kg)   SpO2 92%   BMI 29.60 kg/m  Physical Exam  Constitutional: He is oriented to person, place, and time. He appears well-developed and well-nourished. No distress.  HENT:  Mouth/Throat: Oropharynx is clear and moist. No oropharyngeal exudate.  Cardiovascular: Normal rate, regular rhythm and normal heart sounds. Exam reveals no gallop and no friction rub.  No murmur heard.  Pulmonary/Chest: Effort normal and breath sounds normal. No respiratory distress. He has no wheezes.  Abdominal: Soft. Bowel sounds are normal. He exhibits no distension. There is no tenderness.  Lymphadenopathy:  He has no cervical adenopathy.  Neurological: He is alert and oriented to person, place, and time.  Skin: Skin is warm and dry. No rash noted. No erythema.  Psychiatric: He has a normal mood and affect. His behavior is normal.    Lab Results  Component Value Date   CD4TCELL 11 (L) 06/23/2022   Lab Results  Component Value Date   CD4TABS 93 (L) 06/23/2022   CD4TABS 75 (L) 03/17/2022   CD4TABS 60 (L) 12/01/2021   Lab Results  Component Value Date   HIV1RNAQUANT 104 (H) 06/23/2022   No results found for: "HEPBSAB" Lab Results  Component Value Date    LABRPR NON-REACTIVE 06/23/2022    CBC Lab Results  Component Value Date   WBC 8.8 07/02/2022   RBC 4.38 07/02/2022   HGB 14.4 07/02/2022   HCT 43.3 07/02/2022   PLT 252 07/02/2022   MCV 98.9 07/02/2022   MCH 32.9 07/02/2022   MCHC 33.3 07/02/2022   RDW 12.4 07/02/2022   LYMPHSABS 1.4 06/28/2022   MONOABS 1.2 (H) 06/28/2022   EOSABS 0.1 06/28/2022    BMET Lab Results  Component Value Date   NA 133 (L) 07/02/2022   K 4.8 07/02/2022   CL 93 (L) 07/02/2022   CO2 30 07/02/2022   GLUCOSE 186 (H) 07/02/2022   BUN 38 (H) 07/02/2022   CREATININE 1.60 (H) 07/02/2022   CALCIUM 9.8 07/02/2022   GFRNONAA 52 (L) 07/02/2022   GFRAA 79 12/04/2019      Assessment and Plan  Hiv disease = continue on symtuza and tivicay  Long term medication management = will check cr  Aki = will repeat bmp to see back to baseline  Oi proph = continue on bactrim ds daily

## 2022-07-28 LAB — BASIC METABOLIC PANEL
BUN/Creatinine Ratio: 19 (calc) (ref 6–22)
BUN: 27 mg/dL — ABNORMAL HIGH (ref 7–25)
CO2: 32 mmol/L (ref 20–32)
Calcium: 9.4 mg/dL (ref 8.6–10.3)
Chloride: 100 mmol/L (ref 98–110)
Creat: 1.39 mg/dL — ABNORMAL HIGH (ref 0.70–1.30)
Glucose, Bld: 108 mg/dL — ABNORMAL HIGH (ref 65–99)
Potassium: 4.1 mmol/L (ref 3.5–5.3)
Sodium: 140 mmol/L (ref 135–146)

## 2022-07-29 ENCOUNTER — Other Ambulatory Visit (HOSPITAL_COMMUNITY): Payer: Self-pay

## 2022-08-03 ENCOUNTER — Other Ambulatory Visit: Payer: Self-pay

## 2022-08-03 ENCOUNTER — Other Ambulatory Visit (HOSPITAL_COMMUNITY): Payer: Self-pay

## 2022-08-04 ENCOUNTER — Other Ambulatory Visit (HOSPITAL_COMMUNITY): Payer: Self-pay

## 2022-08-06 ENCOUNTER — Telehealth: Payer: Self-pay

## 2022-08-06 NOTE — Telephone Encounter (Signed)
[facility] Patient Advocate Encounter  Patient's medications have been couriered to [facility] from [facility] and will be picked up .  Symtuza, Tivicay & Sulfa  Bayard More , CPhT [facility] Phone: [phone] Fax:  [phone]  

## 2022-08-13 LAB — ACID FAST CULTURE WITH REFLEXED SENSITIVITIES (MYCOBACTERIA): Acid Fast Culture: NEGATIVE

## 2022-08-16 LAB — ACID FAST CULTURE WITH REFLEXED SENSITIVITIES (MYCOBACTERIA): Acid Fast Culture: NEGATIVE

## 2022-08-18 LAB — ACID FAST CULTURE WITH REFLEXED SENSITIVITIES (MYCOBACTERIA): Acid Fast Culture: NEGATIVE

## 2022-08-19 ENCOUNTER — Encounter (HOSPITAL_COMMUNITY): Payer: Self-pay | Admitting: Internal Medicine

## 2022-08-19 ENCOUNTER — Other Ambulatory Visit: Payer: Self-pay

## 2022-08-19 ENCOUNTER — Inpatient Hospital Stay (HOSPITAL_COMMUNITY)
Admission: EM | Admit: 2022-08-19 | Discharge: 2022-08-22 | DRG: 189 | Disposition: A | Discharge status: Home / Self Care | Payer: 59 | Attending: Internal Medicine | Admitting: Internal Medicine

## 2022-08-19 ENCOUNTER — Emergency Department (HOSPITAL_COMMUNITY): Payer: 59

## 2022-08-19 DIAGNOSIS — Z79899 Other long term (current) drug therapy: Secondary | ICD-10-CM

## 2022-08-19 DIAGNOSIS — J9621 Acute and chronic respiratory failure with hypoxia: Secondary | ICD-10-CM | POA: Diagnosis not present

## 2022-08-19 DIAGNOSIS — B20 Human immunodeficiency virus [HIV] disease: Secondary | ICD-10-CM | POA: Diagnosis present

## 2022-08-19 DIAGNOSIS — N179 Acute kidney failure, unspecified: Secondary | ICD-10-CM | POA: Diagnosis present

## 2022-08-19 DIAGNOSIS — N1831 Chronic kidney disease, stage 3a: Secondary | ICD-10-CM | POA: Diagnosis present

## 2022-08-19 DIAGNOSIS — Z87891 Personal history of nicotine dependence: Secondary | ICD-10-CM

## 2022-08-19 DIAGNOSIS — E1122 Type 2 diabetes mellitus with diabetic chronic kidney disease: Secondary | ICD-10-CM | POA: Diagnosis present

## 2022-08-19 DIAGNOSIS — J440 Chronic obstructive pulmonary disease with acute lower respiratory infection: Secondary | ICD-10-CM | POA: Diagnosis present

## 2022-08-19 DIAGNOSIS — Z888 Allergy status to other drugs, medicaments and biological substances status: Secondary | ICD-10-CM

## 2022-08-19 DIAGNOSIS — Z8611 Personal history of tuberculosis: Secondary | ICD-10-CM

## 2022-08-19 DIAGNOSIS — I5032 Chronic diastolic (congestive) heart failure: Secondary | ICD-10-CM | POA: Diagnosis present

## 2022-08-19 DIAGNOSIS — Z86711 Personal history of pulmonary embolism: Secondary | ICD-10-CM

## 2022-08-19 DIAGNOSIS — Z5986 Financial insecurity: Secondary | ICD-10-CM

## 2022-08-19 DIAGNOSIS — J441 Chronic obstructive pulmonary disease with (acute) exacerbation: Secondary | ICD-10-CM | POA: Diagnosis not present

## 2022-08-19 DIAGNOSIS — J189 Pneumonia, unspecified organism: Secondary | ICD-10-CM | POA: Diagnosis present

## 2022-08-19 DIAGNOSIS — Z9981 Dependence on supplemental oxygen: Secondary | ICD-10-CM

## 2022-08-19 DIAGNOSIS — Z91148 Patient's other noncompliance with medication regimen for other reason: Secondary | ICD-10-CM

## 2022-08-19 DIAGNOSIS — J470 Bronchiectasis with acute lower respiratory infection: Secondary | ICD-10-CM | POA: Diagnosis present

## 2022-08-19 DIAGNOSIS — J439 Emphysema, unspecified: Secondary | ICD-10-CM | POA: Diagnosis present

## 2022-08-19 LAB — COMPREHENSIVE METABOLIC PANEL
ALT: 19 U/L (ref 0–44)
AST: 21 U/L (ref 15–41)
Albumin: 3.4 g/dL — ABNORMAL LOW (ref 3.5–5.0)
Alkaline Phosphatase: 73 U/L (ref 38–126)
Anion gap: 9 (ref 5–15)
BUN: 18 mg/dL (ref 6–20)
CO2: 28 mmol/L (ref 22–32)
Calcium: 8.6 mg/dL — ABNORMAL LOW (ref 8.9–10.3)
Chloride: 96 mmol/L — ABNORMAL LOW (ref 98–111)
Creatinine, Ser: 1.48 mg/dL — ABNORMAL HIGH (ref 0.61–1.24)
GFR, Estimated: 57 mL/min — ABNORMAL LOW (ref >60)
Glucose, Bld: 117 mg/dL — ABNORMAL HIGH (ref 70–99)
Potassium: 3.8 mmol/L (ref 3.5–5.1)
Sodium: 133 mmol/L — ABNORMAL LOW (ref 135–145)
Total Bilirubin: 0.5 mg/dL (ref 0.3–1.2)
Total Protein: 7.6 g/dL (ref 6.5–8.1)

## 2022-08-19 LAB — BRAIN NATRIURETIC PEPTIDE: B Natriuretic Peptide: 12.4 pg/mL (ref 0.0–100.0)

## 2022-08-19 LAB — CBC WITH DIFFERENTIAL/PLATELET
Abs Immature Granulocytes: 0.02 10*3/uL (ref 0.00–0.07)
Basophils Absolute: 0 10*3/uL (ref 0.0–0.1)
Basophils Relative: 1 %
Eosinophils Absolute: 0.4 10*3/uL (ref 0.0–0.5)
Eosinophils Relative: 8 %
HCT: 43.4 % (ref 39.0–52.0)
Hemoglobin: 14.1 g/dL (ref 13.0–17.0)
Immature Granulocytes: 1 %
Lymphocytes Relative: 31 %
Lymphs Abs: 1.3 10*3/uL (ref 0.7–4.0)
MCH: 32.6 pg (ref 26.0–34.0)
MCHC: 32.5 g/dL (ref 30.0–36.0)
MCV: 100.5 fL — ABNORMAL HIGH (ref 80.0–100.0)
Monocytes Absolute: 0.5 10*3/uL (ref 0.1–1.0)
Monocytes Relative: 12 %
Neutro Abs: 2.1 10*3/uL (ref 1.7–7.7)
Neutrophils Relative %: 47 %
Platelets: 152 10*3/uL (ref 150–400)
RBC: 4.32 MIL/uL (ref 4.22–5.81)
RDW: 12.2 % (ref 11.5–15.5)
WBC: 4.3 10*3/uL (ref 4.0–10.5)
nRBC: 0 % (ref 0.0–0.2)

## 2022-08-19 LAB — TROPONIN I (HIGH SENSITIVITY)
Troponin I (High Sensitivity): 6 ng/L (ref <18)
Troponin I (High Sensitivity): 6 ng/L (ref <18)

## 2022-08-19 LAB — LACTIC ACID, PLASMA: Lactic Acid, Venous: 1.4 mmol/L (ref 0.5–1.9)

## 2022-08-19 MED ORDER — IPRATROPIUM-ALBUTEROL 0.5-2.5 (3) MG/3ML IN SOLN
3.0000 mL | RESPIRATORY_TRACT | Status: DC
  Administered 2022-08-19: 3 mL via RESPIRATORY_TRACT
  Filled 2022-08-19: qty 3

## 2022-08-19 MED ORDER — PREDNISONE 20 MG PO TABS
40.0000 mg | ORAL_TABLET | Freq: Every day | ORAL | Status: DC
  Administered 2022-08-20 – 2022-08-22 (×3): 40 mg via ORAL
  Filled 2022-08-19 (×3): qty 2

## 2022-08-19 MED ORDER — BENZONATATE 100 MG PO CAPS
100.0000 mg | ORAL_CAPSULE | Freq: Once | ORAL | Status: AC
  Administered 2022-08-19: 100 mg via ORAL
  Filled 2022-08-19: qty 1

## 2022-08-19 MED ORDER — ENOXAPARIN SODIUM 40 MG/0.4ML IJ SOSY
40.0000 mg | PREFILLED_SYRINGE | INTRAMUSCULAR | Status: DC
  Administered 2022-08-19 – 2022-08-22 (×4): 40 mg via SUBCUTANEOUS
  Filled 2022-08-19 (×4): qty 0.4

## 2022-08-19 MED ORDER — METHYLPREDNISOLONE SODIUM SUCC 125 MG IJ SOLR
125.0000 mg | Freq: Once | INTRAMUSCULAR | Status: AC
  Administered 2022-08-19: 125 mg via INTRAVENOUS
  Filled 2022-08-19: qty 2

## 2022-08-19 MED ORDER — IPRATROPIUM-ALBUTEROL 0.5-2.5 (3) MG/3ML IN SOLN
3.0000 mL | Freq: Once | RESPIRATORY_TRACT | Status: AC
  Administered 2022-08-19: 3 mL via RESPIRATORY_TRACT
  Filled 2022-08-19: qty 3

## 2022-08-19 MED ORDER — UMECLIDINIUM-VILANTEROL 62.5-25 MCG/ACT IN AEPB
1.0000 | INHALATION_SPRAY | Freq: Every day | RESPIRATORY_TRACT | Status: DC
  Administered 2022-08-19 – 2022-08-22 (×3): 1 via RESPIRATORY_TRACT
  Filled 2022-08-19 (×2): qty 14

## 2022-08-19 MED ORDER — DOLUTEGRAVIR SODIUM 50 MG PO TABS
50.0000 mg | ORAL_TABLET | Freq: Every day | ORAL | Status: DC
  Administered 2022-08-19 – 2022-08-22 (×4): 50 mg via ORAL
  Filled 2022-08-19 (×4): qty 1

## 2022-08-19 MED ORDER — ACETAMINOPHEN 325 MG PO TABS: 650.0000 mg | ORAL_TABLET | Freq: Four times a day (QID) | ORAL | Status: DC | PRN

## 2022-08-19 MED ORDER — DARUN-COBIC-EMTRICIT-TENOFAF 800-150-200-10 MG PO TABS
1.0000 | ORAL_TABLET | Freq: Every day | ORAL | Status: DC
  Administered 2022-08-20 – 2022-08-22 (×3): 1 via ORAL
  Filled 2022-08-19 (×3): qty 1

## 2022-08-19 MED ORDER — SULFAMETHOXAZOLE-TRIMETHOPRIM 800-160 MG PO TABS
1.0000 | ORAL_TABLET | Freq: Every day | ORAL | Status: DC
  Administered 2022-08-19 – 2022-08-22 (×4): 1 via ORAL
  Filled 2022-08-19 (×4): qty 1

## 2022-08-19 MED ORDER — AZITHROMYCIN 500 MG PO TABS
500.0000 mg | ORAL_TABLET | Freq: Every day | ORAL | Status: AC
  Administered 2022-08-19 – 2022-08-21 (×3): 500 mg via ORAL
  Filled 2022-08-19: qty 1
  Filled 2022-08-19: qty 2
  Filled 2022-08-19: qty 1

## 2022-08-19 MED ORDER — ACETAMINOPHEN 650 MG RE SUPP: 650.0000 mg | Freq: Four times a day (QID) | RECTAL | Status: DC | PRN

## 2022-08-19 MED ORDER — IOHEXOL 350 MG/ML SOLN
75.0000 mL | Freq: Once | INTRAVENOUS | Status: AC | PRN
  Administered 2022-08-19: 75 mL via INTRAVENOUS

## 2022-08-19 NOTE — Hospital Course (Addendum)
Subjective: No significant overnight events. Patient's breathing and cough continue to improve. He felt about at baseline when walking with the RN yesterday, and he is feeling well this morning. He reports the Tussionex significantly helps his cough, and he would like to continue that at discharge. Discussed plan to discharge today with 3 inhalers in addition to Tussionex. Emphasized importance of following up with PCP on 6/13. All questions and concerns were addressed.  Physical Exam: Constitutional: Patient lying comfortably in bed watching TV. No acute distress. Cardiovascular: Regular rate, regular rhythm. No murmurs, rubs, or gallops.  Pulmonary: Normal respiratory effort on 2L [place]. Able to speak in full sentences without pauses. Good air movement bilaterally with occasional rhonchi. Abdominal: Soft. Non-distended. Normal bowel sounds. Musculoskeletal: Normal muscle bulk. No LE edema.   Neurological: Alert and oriented to person, place, and time. Skin: Warm and dry.    [name] is a 52 year-old with a pertinent PMH of COPD on 2L [place] PRN at home, bronchiectasis, HIV, and T2DM who presented with SOB and cough and admitted on 08/19/2022 for acute on chronic hypoxic respiratory failure secondary to COPD exacerbation.    #Acute on chronic hypoxic respiratory failure 2/2 COPD exacerbation from medication non-adherence #Chronic bronchiectasis and emphysema  Patient presented with a 4-day history of non-productive cough and progressively worsening shortness of breath. Symptoms were consistent with a COPD exacerbation in the setting of inconsistent inhaler use. Infection was not suspected given he remains afebrile without leukocytosis. Patient received Anora Ellipta inhaler 1 puff daily, DuoNebs Q4h, azithromycin 500 mg x 3 days, steroids x 4 days, and Tussionex Q12h while hospitalized. Patient initially had increased oxygen requirement up to 6L [place] but was saturating well on baseline of 2L [place] on day  of discharge. Patient overall is feeling much better. Will discharge patient with Anora Ellipta, beclomethasone, and albuterol inhalers in addition to Tussionex.    #AKI, resolved Creatinine elevated to 1.48 on admission. UA was unremarkable, UPCR 0.28. Renal function improved by day of discharge with creatinine of 1.19. Unsure is patient has some underlying CKD given renal function has varied between 1.31-1.61 over the past year. Recommend continued follow-up outpatient.  #HIV 4/9 Labs showed CD4 93 and viral load 104. Follows with ID with LOV 07/27/22. Home Symtuza, Tivicay and Bactrim were continued during hospitalization and will continue at discharge.   #T2DM A1c 5.9% in 06/2022. Patient not on antidiabetic medication at home. Blood glucose was mildly elevated while on steroids, so sensitive SSI was started during hospitalization. No new medications will be started at discharge.   #HFpEF BNP 12.4 on admission. Echo 11/2021 showed EF 50%, no RWMA. Cardiology saw him in 11/2021 and noted EF fluctuated 45-60%, no therapy recommend at that time. Patient remained euvolemic throughout hospitalization. No new medications will be started at discharge.   #SDOH Patient can obtain 90-day fill of Anora Ellipta and beclomethasone inhaler for $0 copay. I anticipate better medication compliance in the future given cost no longer an issue.  ------------------------------------------------------------------------------------------------------------  06/06 He states that he is not feeling well He states that he was coughing a lot at night. He states states that his shortness of breath has not been better. Asked him about what he know about his COPD. He is unsure about what COPD is. He is unsure that he had this. He stated that he stopped smoking 10 years ago. Curretnly on 3 L of 02. Explained to him about the importance of COPD and what it is. He states  that he does not take his inhaler everyday. I encouraged him  to use his inhaler daily. He reports having gas.   06/07: He has been up and moving. He states that his breathing has improved. He reports that his cough has been better. He reports some sputum. He states that whatever medicine that we gave him has improved him. He does not feel 100% but he does state that he has had improvement. Explained the plan to the patient and answered any questions. Let him know that if he continues to improve, he can go home soon.

## 2022-08-19 NOTE — ED Provider Notes (Signed)
[place] EMERGENCY DEPARTMENT AT [facility] Provider Note   CSN: [id number] Arrival date & time: 08/19/22  0548     History  Chief Complaint  Patient presents with   Shortness of Breath    [name] is a 52 y.o. male.  The history is provided by the patient and medical records.  Shortness of Breath [name] is a 52 y.o. male who presents to the Emergency Department complaining of shortness of breath.  He presents to the emergency department complaining of shortness of breath, chest pain and cough for the last 3 days.  No associated fevers.  He is on home oxygen but feels like it is not working.  He did just run out of his inhaler but otherwise has all of his medications.  No nausea, vomiting, abdominal pain.       Home Medications Prior to Admission medications   Medication Sig Start Date End Date Taking? Authorizing Provider  aspirin EC 81 MG tablet Take 1 tablet (81 mg total) by mouth daily. Swallow whole. Patient not taking: Reported on 06/27/2022 09/30/21 09/30/22  Doran Stabler, DO  cetirizine (ZYRTEC) 5 MG tablet Take 1 tablet (5 mg total) by mouth daily. 09/30/21 06/27/22  Doran Stabler, DO  Darunavir-Cobicistat-Emtricitabine-Tenofovir Alafenamide (SYMTUZA) 800-150-200-10 MG TABS Take 1 tablet by mouth daily with breakfast. 04/16/22   Kuppelweiser, Cassie L, RPH-CPP  dolutegravir (TIVICAY) 50 MG tablet Take 1 tablet (50 mg total) by mouth daily. 04/16/22   Kuppelweiser, Cassie L, RPH-CPP  guaiFENesin 200 MG/10ML LIQD Take 5 mLs by mouth every 4 (four) hours as needed for cough or to loosen phlegm. Patient not taking: Reported on 03/17/2022 11/19/21   Modena Slater, DO  guaiFENesin-dextromethorphan (ROBITUSSIN DM) 100-10 MG/5ML syrup Take 5 mLs by mouth every 4 (four) hours as needed for cough. Patient not taking: Reported on 06/27/2022 03/17/22   Judyann Munson, MD  rosuvastatin (CRESTOR) 20 MG tablet Take 1 tablet (20 mg total) by mouth daily. Patient not  taking: Reported on 06/27/2022 06/23/22 06/23/23  Judyann Munson, MD  sulfamethoxazole-trimethoprim (BACTRIM DS) 800-160 MG tablet Take 1 tablet by mouth daily. 07/10/22   Judyann Munson, MD  triamcinolone 0.1%-Aquaphor equivlanet 1:1 ointment mixture Apply topically 2 (two) times daily. Patient not taking: Reported on 06/27/2022 12/15/21   Judyann Munson, MD  triamcinolone ointment (KENALOG) 0.1 % Apply 1 Application topically 2 (two) times daily. 06/23/22   Judyann Munson, MD  umeclidinium-vilanterol [facility] ELLIPTA) 62.5-25 MCG/ACT AEPB Inhale 1 puff into the lungs daily. 07/27/22   Judyann Munson, MD  vitamin D3 (CHOLECALCIFEROL) 25 MCG tablet Take 1 tablet (1,000 Units total) by mouth daily. Patient not taking: Reported on 07/27/2022 11/19/21   Steffanie Rainwater, MD      Allergies    Metformin and related    Review of Systems   Review of Systems  Respiratory:  Positive for shortness of breath.   All other systems reviewed and are negative.   Physical Exam Updated Vital Signs BP 121/80   Pulse (!) 103   Temp 97.8 F (36.6 C)   Resp (!) 26   Ht 5\' 7"  (1.702 m)   Wt 78 kg   SpO2 (!) 85%   BMI 26.93 kg/m  Physical Exam Vitals and nursing note reviewed.  Constitutional:      Appearance: He is well-developed.  HENT:     Head: Normocephalic and atraumatic.  Cardiovascular:     Rate and Rhythm: Normal rate and regular rhythm.  Heart sounds: No murmur heard. Pulmonary:     Effort: Pulmonary effort is normal. No respiratory distress.     Comments: Diffuse wheezes/rhonchi Abdominal:     Palpations: Abdomen is soft.     Tenderness: There is no abdominal tenderness. There is no guarding or rebound.     Comments: Colostomy in LUQ  Musculoskeletal:        General: No swelling or tenderness.  Skin:    General: Skin is warm and dry.  Neurological:     Mental Status: He is alert and oriented to person, place, and time.  Psychiatric:        Behavior: Behavior normal.     ED  Results / Procedures / Treatments   Labs (all labs ordered are listed, but only abnormal results are displayed) Labs Reviewed  CULTURE, BLOOD (ROUTINE X 2)  CULTURE, BLOOD (ROUTINE X 2)  COMPREHENSIVE METABOLIC PANEL  CBC WITH DIFFERENTIAL/PLATELET  BRAIN NATRIURETIC PEPTIDE  LACTIC ACID, PLASMA  LACTIC ACID, PLASMA  TROPONIN I (HIGH SENSITIVITY)    EKG None  Radiology No results found.  Procedures Procedures    Medications Ordered in ED Medications  ipratropium-albuterol ([name]) 0.5-2.5 (3) MG/3ML nebulizer solution 3 mL (3 mLs Nebulization Given 08/19/22 0629)  methylPREDNISolone sodium succinate (SOLU-MEDROL) 125 mg/2 mL injection 125 mg (125 mg Intravenous Given 08/19/22 1610)    ED Course/ Medical Decision Making/ A&P Clinical Course as of 08/19/22 0751  Wed Aug 19, 2022  9604 Assumed care from Dr Madilyn Hook.  52 year old male with a history of COPD on 2 L nasal cannula as needed and aids who presents emergency department with cough, chest pain, shortness of breath for 3 days.  Was given Solu-Medrol and DuoNebs on arrival.  Chest x-ray shows chronic left greater than right opacification but radiology stating that superimposed infection cannot be excluded. Out of inhaler at home. Wheezing and rhonchi on exam but not in distress.  [RP]    Clinical Course User Index [RP] Rondel Baton, MD                             Medical Decision Making Amount and/or Complexity of Data Reviewed Labs: ordered. Radiology: ordered.  Risk Prescription drug management.   Pt with hx/o bronchiectasis, HIV, COPD, DM here for evaluation of sob.  Pt with wheezing/rhonchi on exam without respiratory distress.  Pt started on neb, solumedrol.  Pt care transferred pending imaging and labs.         Final Clinical Impression(s) / ED Diagnoses Final diagnoses:  None    Rx / DC Orders ED Discharge Orders     None         Tilden Fossa, MD 08/19/22 [phone]

## 2022-08-19 NOTE — ED Notes (Signed)
ED TO INPATIENT HANDOFF REPORT  ED Nurse Name and Phone #: Ines Bloomer, 43  S Name/Age/Gender [name] 52 y.o. male Room/Bed: RESUSC/RESUSC  Code Status   Code Status: Full Code  Home/SNF/Other Home Patient oriented to: self, place, time, and situation Is this baseline? Yes   Triage Complete: Triage complete  Chief Complaint Acute on chronic hypoxic respiratory failure ([name]) [J96.21]  Triage Note Pt arrives to ED c/o SOB x 3 days. Pt reported to have congestion in lower lung bases. Pt found at 88% on RA. Pt with wheeze. Pt w/ hx of COPD, asthma, and CHF. Pt given Albuterol 5mg  and ATROVENT 0.5MG    Allergies Allergies  Allergen Reactions   Metformin And Related Rash    Level of Care/Admitting Diagnosis ED Disposition     ED Disposition  Admit   Condition  --   Comment  Hospital Area: [facility] [[id number]]  Level of Care: Telemetry Medical [104]  May place patient in observation at [facility] or [facility] if equivalent level of care is available:: No  Covid Evaluation: Asymptomatic - no recent exposure (last 10 days) testing not required  Diagnosis: Acute on chronic hypoxic respiratory failure [facility]) [[id number]]  Admitting Physician: Earl Lagos [[id number]]  Attending Physician: Earl Lagos [[id number]]          B Medical/Surgery History Past Medical History:  Diagnosis Date   Acute pulmonary embolus ([name]) 02/24/2016   Dx December 2017 taking Eliquis 5mg  BID   Bronchiectasis with acute exacerbation ([name])    Depression    "stress and depression for any man is common" (03/21/2014)   Diabetes mellitus without complication ([name])    DVT (deep venous thrombosis) ([name]) 11/03/2020   Dyspnea    Genital warts 01/04/2017   Hepatitis    "I don't know what hepatitis I have"   HIV disease ([name])    MSSA bacteremia    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)    TB (pulmonary tuberculosis)    previously treated according to refugee documentation   Past Surgical History:  Procedure Laterality Date   BRONCHIAL WASHINGS Bilateral 04/23/2021   Procedure: BRONCHIAL WASHINGS;  Surgeon: Lorin Glass, MD;  Location: [facility] ENDOSCOPY;  Service: Pulmonary;  Laterality: Bilateral;   FRACTURE SURGERY     IM NAILING TIBIA Right 03/21/2014   ORIF ANKLE FRACTURE Right 03/21/2014   lateral malleolus/notes 03/21/2014   ORIF ANKLE FRACTURE Right 03/21/2014   Procedure: OPEN REDUCTION INTERNAL FIXATION (ORIF) pilon ;  Surgeon: Eldred Manges, MD;  Location: [facility] OR;  Service: Orthopedics;  Laterality: Right;   TEE WITHOUT CARDIOVERSION N/A 05/23/2019   Procedure: TRANSESOPHAGEAL ECHOCARDIOGRAM (TEE);  Surgeon: Chilton Si, MD;  Location: [facility] ENDOSCOPY;  Service: Cardiovascular;  Laterality: N/A;   TIBIA IM NAIL INSERTION Right 03/21/2014   Procedure: INTRAMEDULLARY (IM) NAIL TIBIAL;  Surgeon: Eldred Manges, MD;  Location: [facility] OR;  Service: Orthopedics;  Laterality: Right;   VIDEO BRONCHOSCOPY Bilateral 03/02/2016   Procedure: VIDEO BRONCHOSCOPY WITHOUT FLUORO;  Surgeon: Roslynn Amble, MD;  Location: [facility] ENDOSCOPY;  Service: Cardiopulmonary;  Laterality: Bilateral;   VIDEO BRONCHOSCOPY Left 04/23/2021   Procedure: VIDEO BRONCHOSCOPY WITHOUT FLUORO;  Surgeon: Lorin Glass, MD;  Location: [facility] ENDOSCOPY;  Service: Pulmonary;  Laterality: Left;     A IV Location/Drains/Wounds Patient Lines/Drains/Airways Status     Active Line/Drains/Airways     Name Placement date Placement time Site  Days   Peripheral IV 08/19/22 20 G Anterior;Left;Proximal Forearm 08/19/22  0618  Forearm  less than 1   Colostomy LLQ --  --  LLQ  --   Wound / Incision (Open or Dehisced) 11/11/18 11/11/18  0900  --  1377            Intake/Output Last 24 hours No intake or output data in the 24 hours ending 08/19/22 1227  Labs/Imaging Results for orders placed or performed during the  hospital encounter of 08/19/22 (from the past 48 hour(s))  Culture, blood (routine x 2)     Status: None (Preliminary result)   Collection Time: 08/19/22  6:18 AM   Specimen: BLOOD  Result Value Ref Range   Specimen Description BLOOD LEFT ANTECUBITAL    Special Requests      BOTTLES DRAWN AEROBIC AND ANAEROBIC Blood Culture results may not be optimal due to an inadequate volume of blood received in culture bottles   Culture      NO GROWTH < 12 HOURS Performed at [facility], [address] [place], [place] [address]    Report Status PENDING   Culture, blood (routine x 2)     Status: None (Preliminary result)   Collection Time: 08/19/22  6:23 AM   Specimen: BLOOD  Result Value Ref Range   Specimen Description BLOOD RIGHT ANTECUBITAL    Special Requests      BOTTLES DRAWN AEROBIC AND ANAEROBIC Blood Culture adequate volume   Culture      NO GROWTH < 12 HOURS Performed at [facility], [address], [place], [place] [address]    Report Status PENDING   Comprehensive metabolic panel     Status: Abnormal   Collection Time: 08/19/22  6:32 AM  Result Value Ref Range   Sodium 133 (L) 135 - 145 mmol/L   Potassium 3.8 3.5 - 5.1 mmol/L   Chloride 96 (L) 98 - 111 mmol/L   CO2 28 22 - 32 mmol/L   Glucose, Bld 117 (H) 70 - 99 mg/dL    Comment: Glucose reference range applies only to samples taken after fasting for at least 8 hours.   BUN 18 6 - 20 mg/dL   Creatinine, Ser 2.95 (H) 0.61 - 1.24 mg/dL   Calcium 8.6 (L) 8.9 - 10.3 mg/dL   Total Protein 7.6 6.5 - 8.1 g/dL   Albumin 3.4 (L) 3.5 - 5.0 g/dL   AST 21 15 - 41 U/L   ALT 19 0 - 44 U/L   Alkaline Phosphatase 73 38 - 126 U/L   Total Bilirubin 0.5 0.3 - 1.2 mg/dL   GFR, Estimated 57 (L) >60 mL/min    Comment: (NOTE) Calculated using the CKD-EPI Creatinine Equation (2021)    Anion gap 9 5 - 15    Comment: Performed at [facility], [address], [place], [place] [address]  CBC with Differential     Status:  Abnormal   Collection Time: 08/19/22  6:32 AM  Result Value Ref Range   WBC 4.3 4.0 - 10.5 K/uL   RBC 4.32 4.22 - 5.81 MIL/uL   Hemoglobin 14.1 13.0 - 17.0 g/dL   HCT 86.5 78.4 - 69.6 %   MCV 100.5 (H) 80.0 - 100.0 fL   MCH 32.6 26.0 - 34.0 pg   MCHC 32.5 30.0 - 36.0 g/dL   RDW 29.5 28.4 - 13.2 %   Platelets 152 150 - 400 K/uL   nRBC 0.0 0.0 -  0.2 %   Neutrophils Relative % 47 %   Neutro Abs 2.1 1.7 - 7.7 K/uL   Lymphocytes Relative 31 %   Lymphs Abs 1.3 0.7 - 4.0 K/uL   Monocytes Relative 12 %   Monocytes Absolute 0.5 0.1 - 1.0 K/uL   Eosinophils Relative 8 %   Eosinophils Absolute 0.4 0.0 - 0.5 K/uL   Basophils Relative 1 %   Basophils Absolute 0.0 0.0 - 0.1 K/uL   Immature Granulocytes 1 %   Abs Immature Granulocytes 0.02 0.00 - 0.07 K/uL    Comment: Performed at [facility], [address] [place], [place] [address]  Brain natriuretic peptide     Status: None   Collection Time: 08/19/22  6:32 AM  Result Value Ref Range   B Natriuretic Peptide 12.4 0.0 - 100.0 pg/mL    Comment: Performed at [facility], [address] [place], [place] [address]  Troponin I (High Sensitivity)     Status: None   Collection Time: 08/19/22  6:32 AM  Result Value Ref Range   Troponin I (High Sensitivity) 6 <18 ng/L    Comment: (NOTE) Elevated high sensitivity troponin I (hsTnI) values and significant  changes across serial measurements may suggest ACS but many other  chronic and acute conditions are known to elevate hsTnI results.  Refer to the "Links" section for chest pain algorithms and additional  guidance. Performed at [facility], [address] [place], [place] [address]   Lactic acid, plasma     Status: None   Collection Time: 08/19/22  6:34 AM  Result Value Ref Range   Lactic Acid, Venous 1.4 0.5 - 1.9 mmol/L    Comment: Performed at [facility], [address] [place], [place] [address]  Troponin I (High Sensitivity)     Status: None    Collection Time: 08/19/22  8:24 AM  Result Value Ref Range   Troponin I (High Sensitivity) 6 <18 ng/L    Comment: (NOTE) Elevated high sensitivity troponin I (hsTnI) values and significant  changes across serial measurements may suggest ACS but many other  chronic and acute conditions are known to elevate hsTnI results.  Refer to the "Links" section for chest pain algorithms and additional  guidance. Performed at [facility], [address] [place], [place] [address]    CT Angio Chest PE W and/or Wo Contrast  Result Date: 08/19/2022 CLINICAL DATA:  Chronic lung opacities due to bronchiectasis on recent chest radiographs, making it difficult to exclude superimposed pneumonia. The patient has shortness of breath. EXAM: CT ANGIOGRAPHY CHEST WITH CONTRAST TECHNIQUE: Multidetector CT imaging of the chest was performed using the standard protocol during bolus administration of intravenous contrast. Multiplanar CT image reconstructions and MIPs were obtained to evaluate the vascular anatomy. RADIATION DOSE REDUCTION: This exam was performed according to the departmental dose-optimization program which includes automated exposure control, adjustment of the mA and/or kV according to patient size and/or use of iterative reconstruction technique. CONTRAST:  75mL OMNIPAQUE IOHEXOL 350 MG/ML SOLN COMPARISON:  Chest radiographs dated 08/19/2022 and 06/27/2022. Chest CT dated 06/28/2022. Chest CTA dated 08/22/2021. FINDINGS: Cardiovascular: Satisfactory opacification of the pulmonary arteries to the segmental level. No evidence of pulmonary embolism. Normal heart size. No pericardial effusion. Mediastinum/Nodes: Since 08/22/2021, no significant change in mildly enlarged bilateral hilar lymph nodes. The largest is in the right hilum, with a short axis diameter of 13 mm on image number 58/5. No enlarged mediastinal  nodes. Small hiatal hernia. Unremarkable esophagus and thyroid gland. Lungs/Pleura: Stable  bilateral bullous changes including centrilobular and paraseptal emphysema. Stable pleural and parenchymal scarring at both lung apices. A previously demonstrated area of residual consolidation in the inferior right lower lobe has further improved with a small amount of residual atelectasis or scarring. Areas of bronchiectasis in the left lung are unchanged with improved mucous plugging and wall thickening. There is a persistent oval nodular area of probable mucous plugging posteriorly in the left lower lobe which has been present on previous examinations dating back at least 02/24/2016. This does not need follow-up. Associated chronic parenchymal densities on the left are also less prominent. No new airspace consolidation.  No pleural fluid. Upper Abdomen: Unremarkable. Musculoskeletal: Unremarkable bones. Review of the MIP images confirms the above findings. IMPRESSION: 1. No pulmonary emboli or acute abnormality. 2. Stable changes of COPD with centrilobular and paraseptal emphysema. 3. Stable areas of bronchiectasis in the left lung with improved mucous plugging and wall thickening. 4. Improved area of residual consolidation in the inferior right lower lobe with a small amount of residual atelectasis or scarring. 5. Stable mildly enlarged bilateral hilar lymph nodes, most likely reactive. 6. Small hiatal hernia. Emphysema (ICD10-J43.9). Electronically Signed   By: Beckie Salts M.D.   On: 08/19/2022 10:06   DG Chest Port 1 View  Result Date: 08/19/2022 CLINICAL DATA:  Shortness of breath EXAM: PORTABLE CHEST 1 VIEW COMPARISON:  06/27/2022 FINDINGS: Indistinct pulmonary opacity on the left more than right. Chronic right more than left apical reticulation and bleb formation. Stable heart size and mediastinal contours. IMPRESSION: Chronic left more than right pulmonary opacity attributable to the patient's bronchiectasis. Superimposed pneumonia could easily be obscured. Electronically Signed   By: Tiburcio Pea  M.D.   On: 08/19/2022 06:32    Pending Labs Unresulted Labs (From admission, onward)     Start     Ordered   08/20/22 0500  Basic metabolic panel  Tomorrow morning,   R        08/19/22 1127   08/20/22 0500  CBC  Tomorrow morning,   R        08/19/22 1127            Vitals/Pain Today's Vitals   08/19/22 1020 08/19/22 1030 08/19/22 1045 08/19/22 1100  BP:  116/80 121/75 127/85  Pulse:  95    Resp:  14 16 (!) 21  Temp: 98.1 F (36.7 C)     TempSrc: Temporal     SpO2:  96%    Weight:      Height:      PainSc:        Isolation Precautions No active isolations  Medications Medications  enoxaparin (LOVENOX) injection 40 mg (40 mg Subcutaneous Given 08/19/22 1143)  acetaminophen (TYLENOL) tablet 650 mg (has no administration in time range)    Or  acetaminophen (TYLENOL) suppository 650 mg (has no administration in time range)  Darunavir-Cobicistat-Emtricitabine-Tenofovir Alafenamide (SYMTUZA) 800-150-200-10 MG TABS 1 tablet (has no administration in time range)  dolutegravir (TIVICAY) tablet 50 mg (has no administration in time range)  sulfamethoxazole-trimethoprim (BACTRIM DS) 800-160 MG per tablet 1 tablet (has no administration in time range)  umeclidinium-vilanterol (ANORO ELLIPTA) 62.5-25 MCG/ACT 1 puff (has no administration in time range)  ipratropium-albuterol ([name]) 0.5-2.5 (3) MG/3ML nebulizer solution 3 mL (has no administration in time range)  ipratropium-albuterol ([name]) 0.5-2.5 (3) MG/3ML nebulizer solution 3 mL (3 mLs Nebulization Given 08/19/22 0629)  methylPREDNISolone sodium succinate (SOLU-MEDROL)  125 mg/2 mL injection 125 mg (125 mg Intravenous Given 08/19/22 0629)  iohexol (OMNIPAQUE) 350 MG/ML injection 75 mL (75 mLs Intravenous Contrast Given 08/19/22 0855)  ipratropium-albuterol ([name]) 0.5-2.5 (3) MG/3ML nebulizer solution 3 mL (3 mLs Nebulization Given 08/19/22 1020)  benzonatate (TESSALON) capsule 100 mg (100 mg Oral Given 08/19/22 1020)     Mobility walks     Focused Assessments Pulmonary Assessment Handoff:  Lung sounds: Bilateral Breath Sounds: Diminished O2 Device: Nasal Cannula O2 Flow Rate (L/min): 3 L/min    R Recommendations: See Admitting Provider Note  Report given to:   Additional Notes: A & O x4. VSS

## 2022-08-19 NOTE — ED Triage Notes (Signed)
Pt arrives to ED c/o SOB x 3 days. Pt reported to have congestion in lower lung bases. Pt found at 88% on RA. Pt with wheeze. Pt w/ hx of COPD, asthma, and CHF. Pt given Albuterol 5mg  and ATROVENT 0.5MG 

## 2022-08-19 NOTE — ED Notes (Signed)
RN updated pt of plan of care

## 2022-08-19 NOTE — ED Provider Notes (Signed)
  Physical Exam  BP 124/81 (BP Location: Left Arm)   Pulse 82   Temp 98.1 F (36.7 C) (Oral)   Resp 20   Ht 5\' 7"  (1.702 m)   Wt 78 kg   SpO2 98%   BMI 26.93 kg/m   Physical Exam  Procedures  Procedures  ED Course / MDM   Clinical Course as of 08/20/22 0935  Wed Aug 19, 2022  2956 Assumed care from Dr Madilyn Hook.  52 year old male with a history of COPD on 2 L nasal cannula as needed and aids who presents emergency department with cough, chest pain, shortness of breath for 3 days.  Was given Solu-Medrol and DuoNebs on arrival.  Chest x-ray shows chronic left greater than right opacification but radiology stating that superimposed infection cannot be excluded. Out of inhaler at home. Wheezing and rhonchi on exam but not in distress.  [RP]  1101 Patient has persistent wheezing and will be given another nebulizer.  CTA without any acute findings it appears that his infiltrates are improving since prior.  Dr. Ned Card from internal medicine to admit for COPD exacerbation. [RP]    Clinical Course User Index [RP] Rondel Baton, MD   Medical Decision Making Amount and/or Complexity of Data Reviewed Labs: ordered. Radiology: ordered.  Risk Prescription drug management. Decision regarding hospitalization.     Rondel Baton, MD 08/20/22 [phone]

## 2022-08-19 NOTE — H&P (Signed)
Date: 08/19/2022               Patient Name:  [name] MRN: [id number]  DOB: 11-09-1970 Age / Sex: 52 y.o., male   PCP: Quintella Reichert, MD              Medical Service: Internal Medicine Teaching Service              Attending Physician: Dr. Earl Lagos, MD    First Contact: Gillermina Phy, MS 3 Pager: [phone]  Second Contact: Dr. Rana Snare Pager: [phone]  Third Contact Dr. Elza Rafter Pager: [phone]       After Hours (After 5p/  First Contact Pager: [phone]  weekends / holidays): Second Contact Pager: [phone]   Chief Complaint: shortness of breath and cough  History of Present Illness:  [name] is a 52 year-old male with a past medical history of COPD on 2L [place] PRN at home, bronchiectasis, HIV/AIDS, and type 2 diabetes who presents with shortness of breath and cough.  Patient has been hospitalized 3 times since 08/2021 for COPD exacerbations or pneumonia. Last hospitalization was in 06/2022. Patient reports he has been doing well since he was discharged until 4 days ago when he developed a non-productive cough. Upon noticing the cough, he started using his Anora Ellipta inhaler, but the inhaler did not help his symptoms. He does not use the Anora Ellipta inhaler daily due to cost. Yesterday, the patient was at work when he started to experience shortness of breath. His shortness of breath progressively worsened until around 2 AM this morning when he could not catch his breath, prompting him to call EMS.   Patient has some SOB at baseline, but he is usually able to work or leave his home without oxygen. He has recently required more oxygen with exertion at home, using it up to 6 hours/day. He denies any fevers, chills, recent illness, or recent travel.   ED course: Patient initially tachypneic with O2 saturation 85% on room air. Placed on 3L [place]. Received [name] x 2, Tessalon 100 mg x 1, and Solu-medrol 125 mg. CXR with chronic left greater than right  pulmonary opacity attributable to bronchiectasis, though superimposed pneumonia could not be excluded. CTA chest with no pulmonary emboli or acute abnormality. Patient saturating well on 1L  when seen by IMTS.  Meds:  - Symtuza 800-150-200-10 mg once daily - Tivicay 50 mg once daily - Sulfamethoxazole-trimethoprim 800-160 mg once daily - Anoro Ellipta 1 puff daily (taking PRN)  Allergies: Allergies as of 08/19/2022 - Review Complete 08/19/2022  Allergen Reaction Noted   Metformin and related Rash 02/27/2021   Past Medical History:  Diagnosis Date   Acute pulmonary embolus ([name]) 02/24/2016   Dx December 2017 taking Eliquis 5mg  BID   Bronchiectasis with acute exacerbation ([name])    Depression    "stress and depression for any man is common" (03/21/2014)   Diabetes mellitus without complication ([name])    DVT (deep venous thrombosis) ([name]) 11/03/2020   Dyspnea    Genital warts 01/04/2017   Hepatitis    "I don't know what hepatitis I have"   HIV disease ([name])    MSSA bacteremia    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 He works at [employer]. He does not have many friends or family members in the area for social support. Sister lives in [place] while another sibling lives in [place]. He has not travelled anywhere recently. He is independent of ADLs, iADLs. He does drive. He denies current tobacco, alcohol, or other substance use. He did previously smoke 2 packs/day for 8 years, quit 6 years ago. His PCP is Dr. Marolyn Haller at [facility].  Review of Systems: Patient reports chest pain and headache only when coughing. Patient denies nasal congestion, nausea, vomiting,  constipation, diarrhea, palpitations, orthopnea, PND, dysuria, and hematuria.  Physical Exam: Blood pressure 127/85, pulse 95, temperature 98.1 F (36.7 C), temperature source Temporal, resp. rate (!) 21, height 5\' 7"  (1.702 m), weight 78 kg, SpO2 96 % on 1L [place]. Constitutional: Patient lying comfortably in bed talking to someone on the phone. No acute distress. HENT: Normocephalic and atraumatic.  Eyes: EOMI. Neck: Normal range of motion.  Cardiovascular: Regular rate, regular rhythm. No murmurs, rubs, or gallops. Normal radial and PT pulses bilaterally. Pulmonary: Normal respiratory effort on 1L [place]. Able to speak in full sentences without pauses. Diffuse expiratory wheezes bilaterally with intermittent upper airway sounds appreciated. Occasionally coughing throughout interview. Abdominal: Soft. Mildly-distended. No tenderness to palpation. Normal bowel sounds. Colostomy bag in place with stool output. Musculoskeletal: Normal muscle bulk. No LE edema.   Neurological: Alert and oriented to person, place, and time. Skin: Warm and dry.    EKG: my interpretation is normal sinus rhythm.  CXR: my interpretation is chronic left greater than right pulmonary opacity, likely attributable to bronchiectasis.  Assessment & Plan by Problem: Principal Problem:   Acute on chronic hypoxic respiratory failure ([name])  # Acute of chronic hypoxic respiratory failure # COPD exacerbation # Chronic bronchiectasis and emphysema Patient is presenting with a 4-day history of non-productive cough and progressively worsening shortness of breath. Suspect symptoms are due to a COPD exacerbation in the setting of inconsistent inhaler use. Will restart Anora Ellipta inhaler while hospitalized. Will also order DuoNebs Q4h PRN while awake. Do not suspect patient has pneumonia given he remains afebrile without leukocytosis. CTA also without evidence of acute abnormality. Will start azithromycin 500 mg x 3 days for  antiinflammatory benefits. Will also start prednisone 40 mg x 5 days tomorrow, though can shorten course if needed. Plan to wean O2 as tolerated to SpO2 goal of 88-92%. Of note, although patient eosinophil count 400/microliter, he cannot be started on steroid inhaler per ID given interactions with HIV medications. - Anora Ellipta 1 puff daily - DuoNebs Q4h while awake - Azithromycin 500 mg (day 1 of 3) - Start prednisone 40 mg x 5 days on 6/6 - SpO2 goal 88-92%  # HIV/AIDS Patient follows with infectious disease (Dr. Judyann Munson) outpatient. Last office visit was on 07/27/2022. Labs on 06/23/2022 notable for CD4 93, HIV RNA quant 104. He is currently taking Symtuza and Tivicay. He is also prescribed Bactrim daily for opportunistic infection prophylaxis. Will continue these medications while hospitalized. - Bactrim 800-160 mg once daily - Tivicay 50 mg once daily - Symtuza once daily  # Possible HFpEF Echocardiogram during hospitalization in 11/2021 showed LVEF of 50%. Cardiology saw patient at that time and did not recommend any therapy as his EF has fluctuated between 40-65% based on his heart rate. Patient denies recent orthopnea or PND. He is euvolemic on exam. BNP 12.4 and troponin 6 ->  6, both of which are WNL. Do not suspect current symptoms are due to a heart failure exacerbation, so will not repeat echo at this time.Will continue monitoring volume status. - Monitor volume status  # T2DM  Hgb A1c 5.9 on 06/28/2022. Glucose 117 in ED. Patient does not take any antidiabetic medications at home as diabetes is diet-controlled. Will not start SSI while hospitalized. - Hold SSI  # SDOH Patient reports difficulty with medication adherence due to cost, particularly with the Anora Ellipta inhaler. He also reports difficulty taking time off work for medical appointments. Patient would like to follow-up with [facility], so he can obtain medication refills without requiring an appointment. Will also ask  pharmacy team to price different inhalers to find least expensive regimen. - Appreciate pharmacy team assistance  Diet: Renal Bowel: None VTE: Lovenox IVF: None Code: Full LOS: day 1  Dispo: Admit patient to Observation with expected length of stay less than 2 midnights.  Signed: Whitman Hero, Medical Student 08/19/2022, 2:38 PM  On call pager: [phone]

## 2022-08-20 ENCOUNTER — Other Ambulatory Visit (HOSPITAL_COMMUNITY): Payer: Self-pay

## 2022-08-20 DIAGNOSIS — E1122 Type 2 diabetes mellitus with diabetic chronic kidney disease: Secondary | ICD-10-CM | POA: Diagnosis present

## 2022-08-20 DIAGNOSIS — Z5986 Financial insecurity: Secondary | ICD-10-CM | POA: Diagnosis not present

## 2022-08-20 DIAGNOSIS — I5032 Chronic diastolic (congestive) heart failure: Secondary | ICD-10-CM | POA: Diagnosis present

## 2022-08-20 DIAGNOSIS — Z79899 Other long term (current) drug therapy: Secondary | ICD-10-CM | POA: Diagnosis not present

## 2022-08-20 DIAGNOSIS — J439 Emphysema, unspecified: Secondary | ICD-10-CM

## 2022-08-20 DIAGNOSIS — J470 Bronchiectasis with acute lower respiratory infection: Secondary | ICD-10-CM | POA: Diagnosis present

## 2022-08-20 DIAGNOSIS — Z87891 Personal history of nicotine dependence: Secondary | ICD-10-CM

## 2022-08-20 DIAGNOSIS — J9621 Acute and chronic respiratory failure with hypoxia: Secondary | ICD-10-CM | POA: Diagnosis present

## 2022-08-20 DIAGNOSIS — B20 Human immunodeficiency virus [HIV] disease: Secondary | ICD-10-CM | POA: Diagnosis present

## 2022-08-20 DIAGNOSIS — Z8611 Personal history of tuberculosis: Secondary | ICD-10-CM | POA: Diagnosis not present

## 2022-08-20 DIAGNOSIS — Z9981 Dependence on supplemental oxygen: Secondary | ICD-10-CM | POA: Diagnosis not present

## 2022-08-20 DIAGNOSIS — J479 Bronchiectasis, uncomplicated: Secondary | ICD-10-CM | POA: Diagnosis not present

## 2022-08-20 DIAGNOSIS — Z91148 Patient's other noncompliance with medication regimen for other reason: Secondary | ICD-10-CM | POA: Diagnosis not present

## 2022-08-20 DIAGNOSIS — I5030 Unspecified diastolic (congestive) heart failure: Secondary | ICD-10-CM

## 2022-08-20 DIAGNOSIS — E119 Type 2 diabetes mellitus without complications: Secondary | ICD-10-CM

## 2022-08-20 DIAGNOSIS — N1831 Chronic kidney disease, stage 3a: Secondary | ICD-10-CM | POA: Diagnosis present

## 2022-08-20 DIAGNOSIS — Z86711 Personal history of pulmonary embolism: Secondary | ICD-10-CM | POA: Diagnosis not present

## 2022-08-20 DIAGNOSIS — Z888 Allergy status to other drugs, medicaments and biological substances status: Secondary | ICD-10-CM | POA: Diagnosis not present

## 2022-08-20 DIAGNOSIS — N179 Acute kidney failure, unspecified: Secondary | ICD-10-CM | POA: Diagnosis present

## 2022-08-20 DIAGNOSIS — J189 Pneumonia, unspecified organism: Secondary | ICD-10-CM | POA: Diagnosis present

## 2022-08-20 DIAGNOSIS — J441 Chronic obstructive pulmonary disease with (acute) exacerbation: Secondary | ICD-10-CM | POA: Diagnosis not present

## 2022-08-20 DIAGNOSIS — J440 Chronic obstructive pulmonary disease with acute lower respiratory infection: Secondary | ICD-10-CM | POA: Diagnosis present

## 2022-08-20 LAB — BASIC METABOLIC PANEL
Anion gap: 8 (ref 5–15)
BUN: 25 mg/dL — ABNORMAL HIGH (ref 6–20)
CO2: 27 mmol/L (ref 22–32)
Calcium: 9.3 mg/dL (ref 8.9–10.3)
Chloride: 100 mmol/L (ref 98–111)
Creatinine, Ser: 1.5 mg/dL — ABNORMAL HIGH (ref 0.61–1.24)
GFR, Estimated: 56 mL/min — ABNORMAL LOW (ref >60)
Glucose, Bld: 122 mg/dL — ABNORMAL HIGH (ref 70–99)
Potassium: 4.7 mmol/L (ref 3.5–5.1)
Sodium: 135 mmol/L (ref 135–145)

## 2022-08-20 LAB — PROTEIN / CREATININE RATIO, URINE
Creatinine, Urine: 119 mg/dL
Protein Creatinine Ratio: 0.28 mg/mg{Cre} — ABNORMAL HIGH (ref 0.00–0.15)
Total Protein, Urine: 33 mg/dL

## 2022-08-20 LAB — URINALYSIS, ROUTINE W REFLEX MICROSCOPIC
Bilirubin Urine: NEGATIVE
Glucose, UA: NEGATIVE mg/dL
Hgb urine dipstick: NEGATIVE
Ketones, ur: NEGATIVE mg/dL
Leukocytes,Ua: NEGATIVE
Nitrite: NEGATIVE
Protein, ur: NEGATIVE mg/dL
Specific Gravity, Urine: 1.018 (ref 1.005–1.030)
pH: 6 (ref 5.0–8.0)

## 2022-08-20 LAB — CBC
HCT: 43.9 % (ref 39.0–52.0)
Hemoglobin: 14.2 g/dL (ref 13.0–17.0)
MCH: 32.6 pg (ref 26.0–34.0)
MCHC: 32.3 g/dL (ref 30.0–36.0)
MCV: 100.7 fL — ABNORMAL HIGH (ref 80.0–100.0)
Platelets: 167 10*3/uL (ref 150–400)
RBC: 4.36 MIL/uL (ref 4.22–5.81)
RDW: 12.3 % (ref 11.5–15.5)
WBC: 8.4 10*3/uL (ref 4.0–10.5)
nRBC: 0 % (ref 0.0–0.2)

## 2022-08-20 MED ORDER — SIMETHICONE 80 MG PO CHEW
80.0000 mg | CHEWABLE_TABLET | Freq: Once | ORAL | Status: AC
  Administered 2022-08-20: 80 mg via ORAL
  Filled 2022-08-20: qty 1

## 2022-08-20 MED ORDER — CALCIUM CARBONATE ANTACID 500 MG PO CHEW: 1.0000 | CHEWABLE_TABLET | Freq: Three times a day (TID) | ORAL | Status: DC | PRN

## 2022-08-20 MED ORDER — IPRATROPIUM-ALBUTEROL 0.5-2.5 (3) MG/3ML IN SOLN
3.0000 mL | Freq: Three times a day (TID) | RESPIRATORY_TRACT | Status: DC
  Administered 2022-08-22: 3 mL via RESPIRATORY_TRACT
  Filled 2022-08-20 (×3): qty 3

## 2022-08-20 MED ORDER — ONDANSETRON HCL 4 MG/2ML IJ SOLN
4.0000 mg | Freq: Three times a day (TID) | INTRAMUSCULAR | Status: DC | PRN
  Administered 2022-08-20: 4 mg via INTRAVENOUS
  Filled 2022-08-20: qty 2

## 2022-08-20 MED ORDER — HYDROCOD POLI-CHLORPHE POLI ER 10-8 MG/5ML PO SUER
5.0000 mL | Freq: Two times a day (BID) | ORAL | Status: DC
  Administered 2022-08-20 – 2022-08-22 (×5): 5 mL via ORAL
  Filled 2022-08-20 (×5): qty 5

## 2022-08-20 MED ORDER — CALCIUM CARBONATE ANTACID 500 MG PO CHEW
1.0000 | CHEWABLE_TABLET | Freq: Two times a day (BID) | ORAL | Status: DC | PRN
  Administered 2022-08-20 (×2): 200 mg via ORAL
  Filled 2022-08-20 (×2): qty 1

## 2022-08-20 MED ORDER — IPRATROPIUM-ALBUTEROL 0.5-2.5 (3) MG/3ML IN SOLN
3.0000 mL | RESPIRATORY_TRACT | Status: DC
  Administered 2022-08-20 (×4): 3 mL via RESPIRATORY_TRACT
  Filled 2022-08-20 (×4): qty 3

## 2022-08-20 NOTE — Plan of Care (Signed)
  Problem: Education: Goal: Knowledge of General Education information will improve Description: Including pain rating scale, medication(s)/side effects and non-pharmacologic comfort measures 08/20/2022 0154 by Duffy Bruce, RN Outcome: Progressing 08/20/2022 0154 by Duffy Bruce, RN Outcome: Progressing   Problem: Health Behavior/Discharge Planning: Goal: Ability to manage health-related needs will improve 08/20/2022 0154 by Duffy Bruce, RN Outcome: Progressing 08/20/2022 0154 by Duffy Bruce, RN Outcome: Progressing   Problem: Clinical Measurements: Goal: Respiratory complications will improve 08/20/2022 0154 by Duffy Bruce, RN Outcome: Progressing 08/20/2022 0154 by Duffy Bruce, RN Outcome: Progressing   Problem: Activity: Goal: Risk for activity intolerance will decrease 08/20/2022 0154 by Duffy Bruce, RN Outcome: Progressing 08/20/2022 0154 by Duffy Bruce, RN Outcome: Progressing   Problem: Nutrition: Goal: Adequate nutrition will be maintained 08/20/2022 0154 by Duffy Bruce, RN Outcome: Progressing 08/20/2022 0154 by Duffy Bruce, RN Outcome: Progressing   Problem: Coping: Goal: Level of anxiety will decrease 08/20/2022 0154 by Duffy Bruce, RN Outcome: Progressing 08/20/2022 0154 by Duffy Bruce, RN Outcome: Progressing

## 2022-08-20 NOTE — Plan of Care (Addendum)
Walking O2 test  Pt sating 92% on 2L [place] while sitting. RN removed O2 and pt started to de-sat to 85% on room air while sitting. RN replaced the O2 and walked pt on 2L [place]. While walking pt started to de-sat to to 85% while walking on 2L [place], RN increased O2 to 3L [place], pt started to have a coughing fit while walking, pt started to de-sat to 82%, RN and pt stopped walking and RN increased O2 tp 4L [place] and waited, pt started to come back up to 88%, pt came back to room. Pt has SOB and coughing fits while ambulating. RN notified Dr.Narendra and Dr.Zheng and team of findings.

## 2022-08-20 NOTE — Plan of Care (Signed)
After walking, pt had a episode of vomiting, and aldo started to get into coughing fits. Pt needing 6L [place] to stay at least 88%, Pt HR sustaining 115-120's, RN notified Dr.Zheng and team. MD came to see pt and assess pt. Pt recently received Tussionex from RN.   1250:Pt more calm, pt resting, pt stating that he feels better, pt saturations slightly improved to 92%, RN able to wean pt to 5L , Pt not coughing much anymore, Pt HR 103, RN updated Dr.Zheng.

## 2022-08-20 NOTE — TOC Benefit Eligibility Note (Signed)
Patient Product/process development scientist completed.    The patient is currently admitted and upon discharge could be taking Anoro Ellipta 62.5-25 mcg/ACT.  The current 90 day co-pay is $0.00.   The patient is insured through [employer]   This test claim was processed through [employer]- copay amounts may vary at other pharmacies due to [employer], or as the patient moves through the different stages of their insurance plan.  Roland Earl, CPHT Pharmacy Patient Advocate Specialist [facility] Patient Advocate Team Direct Number: [phone]  Fax: [phone]

## 2022-08-20 NOTE — Progress Notes (Signed)
RT note. Patient placed on 6 L [place] while in rm due to satting in the high 80's from coughing spell.  Patient left on 6 L [place] sat 90%, will continue to titrate oxygen. Patient does have flutter at bedside.  RT will continue to monitor.   08/20/22 1055  Therapy Vitals  Pulse Rate (!) 126  Resp (!) 22  MEWS Score/Color  MEWS Score 3  MEWS Score Color Yellow  Respiratory Assessment  Assessment Type Mid-treatment  Respiratory Pattern Regular;Unlabored;Dyspnea with exertion (Labored with coughing)  Chest Assessment Chest expansion symmetrical  Cough Non-productive;Strong  Bilateral Breath Sounds Rhonchi;Expiratory wheezes  Oxygen Therapy/Pulse Ox  O2 Device Nasal Cannula  O2 Therapy Oxygen  O2 Flow Rate (L/min) (S)  6 L/min (Patient with coughing fit sat in 80's. will try to titrate down)  SpO2 90 %

## 2022-08-20 NOTE — Progress Notes (Signed)
HD#0 Subjective:   Summary: [name] is a 52 y.o. male with PMH of COPD on 2L [place] at home, bronchiectasis, HIV and T2DM who presents with SOB and cough and admitted for acute on chronic hypoxic respiratory failure secondary to COPD exacerbation.  Overnight Events: none  He states not feeling well because unable to get much sleep due to coughing fits overnight. Reports SOB somewhat better but still wheezes. Discussed COPD since he was unsure/unclear about it and his home inhaler use. Does not take inhaler every day to help maximize how long inhaler lasts. States he smoked for about 5 years but quit 10 years ago. Currently on 3L [place]. Encouraged at home to take inhaler daily. Discussed plan with patient and answered all questions.   Objective:  Vital signs in last 24 hours: Vitals:   08/19/22 1552 08/19/22 2041 08/19/22 2330 08/20/22 0529  BP: 124/78  136/83 124/81  Pulse: 92  (!) 107 82  Resp: 18  20 20   Temp: (!) 97.5 F (36.4 C)  97.6 F (36.4 C) 98.1 F (36.7 C)  TempSrc: Oral  Oral Oral  SpO2: 99% 96% 94% 97%  Weight:      Height:       Supplemental O2: Nasal Cannula SpO2: 97 % O2 Flow Rate (L/min): 3 L/min   Physical Exam:  Constitutional: alert, lying in bed, in no acute distress HENT: normocephalic atraumatic Cardiovascular: regular rate and rhythm Pulmonary/Chest: mildly increased work of breathing on 3L [place], diffuse expiratory wheezing, no crackles MSK: normal bulk and tone, no LE edema Neurological: awake and alert Skin: warm and dry Psych: pleasant mood  Filed Weights   08/19/22 0607  Weight: 78 kg     Intake/Output Summary (Last 24 hours) at 08/20/2022 0646 Last data filed at 08/19/2022 2330 Gross per 24 hour  Intake 480 ml  Output 300 ml  Net 180 ml   Net IO Since Admission: 180 mL [08/20/22 0646]  Pertinent Labs:    Latest Ref Rng & Units 08/19/2022    6:32 AM 07/02/2022    1:12 AM 07/01/2022    1:34 AM  CBC  WBC 4.0 - 10.5 K/uL 4.3  8.8   10.4   Hemoglobin 13.0 - 17.0 g/dL 16.1  09.6  04.5   Hematocrit 39.0 - 52.0 % 43.4  43.3  41.7   Platelets 150 - 400 K/uL 152  252  234        Latest Ref Rng & Units 08/19/2022    6:32 AM 07/27/2022   11:32 AM 07/02/2022    1:12 AM  CMP  Glucose 70 - 99 mg/dL 409  811  914   BUN 6 - 20 mg/dL 18  27  38   Creatinine 0.61 - 1.24 mg/dL 7.82  9.56  2.13   Sodium 135 - 145 mmol/L 133  140  133   Potassium 3.5 - 5.1 mmol/L 3.8  4.1  4.8   Chloride 98 - 111 mmol/L 96  100  93   CO2 22 - 32 mmol/L 28  32  30   Calcium 8.9 - 10.3 mg/dL 8.6  9.4  9.8   Total Protein 6.5 - 8.1 g/dL 7.6     Total Bilirubin 0.3 - 1.2 mg/dL 0.5     Alkaline Phos 38 - 126 U/L 73     AST 15 - 41 U/L 21     ALT 0 - 44 U/L 19       Imaging:  CT Angio Chest PE W and/or Wo Contrast  Result Date: 08/19/2022 CLINICAL DATA:  Chronic lung opacities due to bronchiectasis on recent chest radiographs, making it difficult to exclude superimposed pneumonia. The patient has shortness of breath. EXAM: CT ANGIOGRAPHY CHEST WITH CONTRAST TECHNIQUE: Multidetector CT imaging of the chest was performed using the standard protocol during bolus administration of intravenous contrast. Multiplanar CT image reconstructions and MIPs were obtained to evaluate the vascular anatomy. RADIATION DOSE REDUCTION: This exam was performed according to the departmental dose-optimization program which includes automated exposure control, adjustment of the mA and/or kV according to patient size and/or use of iterative reconstruction technique. CONTRAST:  75mL OMNIPAQUE IOHEXOL 350 MG/ML SOLN COMPARISON:  Chest radiographs dated 08/19/2022 and 06/27/2022. Chest CT dated 06/28/2022. Chest CTA dated 08/22/2021. FINDINGS: Cardiovascular: Satisfactory opacification of the pulmonary arteries to the segmental level. No evidence of pulmonary embolism. Normal heart size. No pericardial effusion. Mediastinum/Nodes: Since 08/22/2021, no significant change in mildly  enlarged bilateral hilar lymph nodes. The largest is in the right hilum, with a short axis diameter of 13 mm on image number 58/5. No enlarged mediastinal nodes. Small hiatal hernia. Unremarkable esophagus and thyroid gland. Lungs/Pleura: Stable bilateral bullous changes including centrilobular and paraseptal emphysema. Stable pleural and parenchymal scarring at both lung apices. A previously demonstrated area of residual consolidation in the inferior right lower lobe has further improved with a small amount of residual atelectasis or scarring. Areas of bronchiectasis in the left lung are unchanged with improved mucous plugging and wall thickening. There is a persistent oval nodular area of probable mucous plugging posteriorly in the left lower lobe which has been present on previous examinations dating back at least 02/24/2016. This does not need follow-up. Associated chronic parenchymal densities on the left are also less prominent. No new airspace consolidation.  No pleural fluid. Upper Abdomen: Unremarkable. Musculoskeletal: Unremarkable bones. Review of the MIP images confirms the above findings. IMPRESSION: 1. No pulmonary emboli or acute abnormality. 2. Stable changes of COPD with centrilobular and paraseptal emphysema. 3. Stable areas of bronchiectasis in the left lung with improved mucous plugging and wall thickening. 4. Improved area of residual consolidation in the inferior right lower lobe with a small amount of residual atelectasis or scarring. 5. Stable mildly enlarged bilateral hilar lymph nodes, most likely reactive. 6. Small hiatal hernia. Emphysema (ICD10-J43.9). Electronically Signed   By: Beckie Salts M.D.   On: 08/19/2022 10:06    Assessment/Plan:   Principal Problem:   Acute on chronic hypoxic respiratory failure [facility])   Patient Summary: [name] is a 52 y.o. with a pertinent PMH of COPD on 2L [place] at home, bronchiectasis, HIV and T2DM who presents with SOB and cough and  admitted for acute on chronic hypoxic respiratory failure secondary to COPD exacerbation.  #Acute on chronic hypoxic respiratory failure 2/2 COPD exacerbation from medication non-adherence #Chronic bronchiectasis and emphysema  Presents with SOB and cough found to require increased supplemental O2 compared to his home 2L [place] PRN. CTA yesterday without signs of PE and stable changes of COPD, emphysema and bronchiectasis. Initial evaluation this morning showed patient fairly comfortable on 3L [place]. Stated only smoked for about 5 years and quit 10 years ago. Does not appear he has had formal PFT done even though orders placed. Consider checking for alpha-1 antitrypsin. After RN ambulated patient for O2 assessment later in morning, patient desatted with coughing fits. O2 requirement increased to 6L [place]. Tussionex given to help with cough. Reassessment showed improved  SpO2 and weaning down supplemental O2. Diffuse wheezing but no crackles. BP stable, afebrile and normal WBC. Continue azithromycin, Anora and scheduled duonebs. Got Solumedrol yesterday, on prednisone today. He has IS and flutter valve.  -check alpha 1 antitrypsin  -Anora Ellipta 1 puff daily -Duonebs q4h scheduled -azithromycin 500 mg daily (day 2/3) -prednisone 40 mg daily (day 1/5) -SpO2 goal 88-92% -reassess O2 needs, consider additional steroid dose, if worsening or respiratory distress, consider PCCM consult   #HIV 4/9 Labs showed CD4 93 and viral load 104. Follows with ID with LOV 07/27/22. Continue home medications.  -continue Symtuza, Tivicay and Bactrim   #Likely CKD IIIA Creatinine 1.5 today from 1.48 on admission. At last discharge was 1.60. Some variation of his creatinine in last year. Likely CKD but etiology unclear at this time. UA was unremarkable. Pending protein creatinine ratio. Continue monitoring renal function.   #T2DM A1c 5.9% in April, not on antidiabetic medication at home. AM glucose 122 while on steroids, after  solumedrol. Hold off on SSI for now, can initiate if needed.    #HFpEF BNP 12.4. Echo 11/2021 showed EF 50%, no RWMA. Cardiology saw him in 11/2021 and noted EF fluctuated 45-60% and did not recommend therapy at that time. Appears euvolemic on exam today. Continue monitor volume status.   #SDOH Spoke with pharmacy team and Simonne Maffucci inhaler available for $0 copay with 90 day fill. I hope this will help with medication adherence moving forward. Appreciate pharmacy team assistance.  Diet: Renal IVF: None,None VTE: Enoxaparin Code: Full TOC recs: pending Family Update: no family at bedside this morning   Dispo: Anticipated discharge to  pending  oxygen requirement and medical stability.   Rana Snare, DO Internal Medicine Resident PGY-1 Pager: [phone] Please contact the on call pager after 5 pm and on weekends at [phone].

## 2022-08-21 ENCOUNTER — Other Ambulatory Visit (HOSPITAL_COMMUNITY): Payer: Self-pay

## 2022-08-21 LAB — BASIC METABOLIC PANEL
Anion gap: 9 (ref 5–15)
BUN: 31 mg/dL — ABNORMAL HIGH (ref 6–20)
CO2: 28 mmol/L (ref 22–32)
Calcium: 8.8 mg/dL — ABNORMAL LOW (ref 8.9–10.3)
Chloride: 95 mmol/L — ABNORMAL LOW (ref 98–111)
Creatinine, Ser: 1.37 mg/dL — ABNORMAL HIGH (ref 0.61–1.24)
GFR, Estimated: >60 mL/min (ref >60)
Glucose, Bld: 193 mg/dL — ABNORMAL HIGH (ref 70–99)
Potassium: 3.8 mmol/L (ref 3.5–5.1)
Sodium: 132 mmol/L — ABNORMAL LOW (ref 135–145)

## 2022-08-21 LAB — CBC
HCT: 43.6 % (ref 39.0–52.0)
Hemoglobin: 14 g/dL (ref 13.0–17.0)
MCH: 32.3 pg (ref 26.0–34.0)
MCHC: 32.1 g/dL (ref 30.0–36.0)
MCV: 100.7 fL — ABNORMAL HIGH (ref 80.0–100.0)
Platelets: 156 10*3/uL (ref 150–400)
RBC: 4.33 MIL/uL (ref 4.22–5.81)
RDW: 12.4 % (ref 11.5–15.5)
WBC: 6 10*3/uL (ref 4.0–10.5)
nRBC: 0 % (ref 0.0–0.2)

## 2022-08-21 LAB — CULTURE, BLOOD (ROUTINE X 2)

## 2022-08-21 LAB — GLUCOSE, CAPILLARY
Glucose-Capillary: 154 mg/dL — ABNORMAL HIGH (ref 70–99)
Glucose-Capillary: 117 mg/dL — ABNORMAL HIGH (ref 70–99)

## 2022-08-21 MED ORDER — INSULIN ASPART 100 UNIT/ML IJ SOLN
0.0000 [IU] | Freq: Three times a day (TID) | INTRAMUSCULAR | Status: DC
  Administered 2022-08-21: 2 [IU] via SUBCUTANEOUS
  Administered 2022-08-22 (×2): 1 [IU] via SUBCUTANEOUS

## 2022-08-21 MED ORDER — ALBUTEROL SULFATE HFA 108 (90 BASE) MCG/ACT IN AERS
2.0000 | INHALATION_SPRAY | Freq: Four times a day (QID) | RESPIRATORY_TRACT | 2 refills | Status: DC | PRN
  Filled 2022-08-21: qty 6.7, 25d supply, fill #0

## 2022-08-21 MED ORDER — ANORO ELLIPTA 62.5-25 MCG/ACT IN AEPB
1.0000 | INHALATION_SPRAY | Freq: Every day | RESPIRATORY_TRACT | 0 refills | Status: DC
  Filled 2022-08-21: qty 60, 30d supply, fill #0

## 2022-08-21 MED ORDER — BECLOMETHASONE DIPROP HFA 80 MCG/ACT IN AERB
1.0000 | INHALATION_SPRAY | Freq: Two times a day (BID) | RESPIRATORY_TRACT | 0 refills | Status: DC
  Filled 2022-08-21: qty 10.6, 30d supply, fill #0

## 2022-08-21 NOTE — TOC Benefit Eligibility Note (Signed)
Patient Product/process development scientist completed.    The patient is currently admitted and upon discharge could be taking Qvar Redihaler 80 mcg/act inhaler.  The current 30 day co-pay is $0.00.   The patient is insured through [employer]   This test claim was processed through [employer]- copay amounts may vary at other pharmacies due to [employer], or as the patient moves through the different stages of their insurance plan.  Roland Earl, CPHT Pharmacy Patient Advocate Specialist [facility] Patient Advocate Team Direct Number: [phone]  Fax: [phone]

## 2022-08-21 NOTE — TOC Initial Note (Signed)
Transition of Care [facility]) - Initial/Assessment Note    Patient Details  Name: [name] MRN: [id number] Date of Birth: 02/11/1971  Transition [facility]) CM/SW Contact:    Janae Bridgeman, RN Phone Number: 08/21/2022, 3:13 PM  Clinical Narrative:                 CM met with the patient at the bedside to discuss TOC needs.  Patient lives at home and admitted for the hospital for Pulmonary Edema. Interpreter used at the bedside.  Patient lives alone in an apartment and patient is requesting assistance with house.  I provided resources in the AVS for social services to assist with housing since patient has current housing but does not like the apartment "since it is noisy".  I encouraged the patient to reach out to social services to assist or explore local housing options to change apartment complexes.  The patient requests assistance with Medicaid.  Patient has Medicare and [employer] and states that he applied for [place] Medicaid months ago and was frustrated by the process.  I encouraged the patient to review his letters receives by [facility] after applying and reach out to his Medicaid social worker if he was provided one.   The patient admitted to the hospital by ambulance.  He has home oxygen through [place] and plans to discharge home tomorrow.  The patient has a cell phone in the room and he called a friend and asked that he bring his portable oxygen tank from home and assist with transportation home tomorrow since the hospital does not have a transportation system through [place].  Patient plans to call a friend for transportation home.  Expected Discharge Plan: Home/Self Care Barriers to Discharge: Continued Medical Work up   Patient Goals and CMS Choice Patient states their goals for this hospitalization and ongoing recovery are:: To return home tomorrow CMS Medicare.gov Compare Post Acute Care list provided to:: Patient Choice offered to / list presented to : Patient [place]  ownership interest in [facility].provided to:: Patient    Expected Discharge Plan and Services   Discharge Planning Services: CM Consult Post Acute Care Choice: Resumption of Svcs/PTA Provider Living arrangements for the past 2 months: Apartment                                      Prior Living Arrangements/Services Living arrangements for the past 2 months: Apartment Lives with:: Self Patient language and need for interpreter reviewed:: Yes (Ipad used for [name] interpretation) Do you feel safe going back to the place where you live?: Yes (Patient states "I do not like my apartment because it's noisy")      Need for Family Participation in Patient Care: Yes (Comment) Care giver support system in place?: Yes (comment) Current home services: DME (Home oxygen through [place]) Criminal Activity/Legal Involvement Pertinent to Current Situation/Hospitalization: No - Comment as needed  Activities of Daily Living Home Assistive Devices/Equipment: Oxygen ADL Screening (condition at time of admission) Patient's cognitive ability adequate to safely complete daily activities?: Yes Is the patient deaf or have difficulty hearing?: No Does the patient have difficulty seeing, even when wearing glasses/contacts?: No Does the patient have difficulty concentrating, remembering, or making decisions?: No Patient able to express need for assistance with ADLs?: Yes Does the patient have difficulty dressing or bathing?: No Independently performs ADLs?: Yes (appropriate for developmental age) Does the  patient have difficulty walking or climbing stairs?: No Weakness of Legs: None Weakness of Arms/Hands: None  Permission Sought/Granted Permission sought to share information with : Case Manager, Magazine features editor Permission granted to share information with : Yes, Verbal Permission Granted              Emotional Assessment Appearance:: Appears stated  age Attitude/Demeanor/Rapport: Complaining, Apprehensive Affect (typically observed): Frustrated Orientation: : Oriented to Self, Oriented to Place, Oriented to  Time, Oriented to Situation Alcohol / Substance Use: Not Applicable (quit smoking) Psych Involvement: No (comment)  Admission diagnosis:  Acute on chronic hypoxic respiratory failure ([name]) [J96.21] Patient Active Problem List   Diagnosis Date Noted   Acute on chronic hypoxic respiratory failure ([name]) 08/19/2022   CAP (community acquired pneumonia) 06/27/2022   Adenopathy 12/07/2021   Acute on chronic combined systolic and diastolic HF (heart failure) ([name]) 12/07/2021   Bacteremia 11/17/2021   Listeriosis    Sepsis ([name]) 11/15/2021   Atopic dermatitis 09/30/2021   Allergy 09/30/2021   Steroid-induced hyperglycemia 09/05/2021   COPD exacerbation ([name]) 08/23/2021   Bronchiectasis without acute exacerbation ([name]) 04/22/2021   Pruritic dermatitis 04/03/2021   S/P colostomy ([name]) 02/06/2021   History of tuberculosis 11/01/2020   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   CKD (chronic kidney disease) stage 2, GFR 60-89 ml/min 11/01/2020   Type 2 diabetes mellitus ([name]) 05/17/2019   Genital warts 05/06/2018   Pulmonary emphysema ([name])    Condyloma 01/04/2017   Chronic systolic CHF (congestive heart failure) ([name]) 04/24/2016   Normochromic normocytic anemia 02/25/2016   Pneumonia 02/25/2016   AIDS (acquired immune deficiency syndrome) ([name])    S/P ORIF (open reduction internal fixation) fracture 03/21/2014   HIV disease ([name]) 09/18/2013   PCP:  Quintella Reichert, MD Pharmacy:   [facility] [phone] - [place], [place] - [address] AT [facility] OF [address] [facility] [address] [place] [address] Phone: [phone] Fax: [phone]  [facility] [address] [place] [address] Phone: [phone] Fax: [phone]  [facility] -  [place], [place] - [address] [place] [address] Phone: [phone] Fax: [phone]  [id number] - [place], [place] - [address] AT [facility] OF [address] [place] [address] Phone: [phone] Fax: [phone]  [facility] [address] [place] [address] Phone: [phone] Fax: [phone]     Social Determinants of Health (SDOH) Social History: SDOH Screenings   Food Insecurity: No Food Insecurity (08/19/2022)  Housing: High Risk (08/19/2022)  Transportation Needs: No Transportation Needs (08/19/2022)  Utilities: Not At Risk (08/19/2022)  Depression (PHQ2-9): Low Risk  (07/27/2022)  Tobacco Use: Medium Risk (08/19/2022)   SDOH Interventions:     Readmission Risk Interventions    08/21/2022    3:13 PM 07/02/2022   12:34 PM  Readmission Risk Prevention Plan  Post Dischage Appt  Complete  Medication Screening  Complete  Transportation Screening Complete Complete  PCP or Specialist Appt within 5-7 Days Complete   Home Care Screening Complete   Medication Review (RN CM) Complete

## 2022-08-21 NOTE — Plan of Care (Signed)

## 2022-08-21 NOTE — Progress Notes (Signed)
Subjective:  No significant overnight events. Patient feeling much better this morning. He reports the inhalers have significantly helped his breathing, and he would like to continue the inhalers at home. He also has not been coughing as often. He was able to ambulate around the room without difficulty. Discussed plan to continue bronchodilators, steroids, and repeat ambulatory O2 saturation. All questions or concerns were addressed.  Objective:  Vital signs in last 24 hours: Vitals:   08/20/22 2131 08/21/22 0132 08/21/22 0626 08/21/22 0725  BP: 113/79 112/81 122/79 120/86  Pulse: 89 84 71 82  Resp: 18 18 18 19   Temp: 98 F (36.7 C) 98.1 F (36.7 C) 98.1 F (36.7 C) 98.1 F (36.7 C)  TempSrc:      SpO2: 97% 96% 98% 100%  Weight:      Height:       Physical Exam: Constitutional: Patient lying comfortably in bed watching TV. No acute distress. Cardiovascular: Regular rate, regular rhythm. No murmurs, rubs, or gallops.  Pulmonary: Normal respiratory effort on 3L [place]. Able to speak in full sentences without pauses. Lungs clear to auscultation bilaterally. Occasionally coughing with deep breaths. Abdominal: Soft. Non-distended. No tenderness to palpation. Normal bowel sounds. Musculoskeletal: Normal muscle bulk. No LE edema.   Neurological: Alert and oriented to person, place, and time. Skin: Warm and dry.  Assessment/Plan:  Principal Problem:   Acute on chronic hypoxic respiratory failure ([name])  [name] is a 52 y.o. with a pertinent PMH of COPD on 2L [place] PRN at home, bronchiectasis, HIV, and T2DM who presented with SOB and cough and admitted on 08/19/2022 for acute on chronic hypoxic respiratory failure secondary to COPD exacerbation.   #Acute on chronic hypoxic respiratory failure 2/2 COPD exacerbation from medication non-adherence #Chronic bronchiectasis and emphysema  SOB and cough are improving with inhalers. Patient continues to require 3L [place] to maintain O2  saturation at rest, which is above his baseline of 2L [place] PRN at home. He was able to ambulate around his room this morning without difficulty. Repeat ambulatory pulse ox with desaturation to 85% on 2L and 89% after increasing to 4L. Will continue bronchodilators, prednisone, and azithromycin. Plan to wean oxygen to goal SpO2 88-92%. Will consider starting beclomethasone inhaler at discharge since this medicine interferes least with his HIV medications, though cost may be an issue. - Anora Ellipta 1 puff daily - Duonebs q4h scheduled - Azithromycin 500 mg daily (day 3/3) - Prednisone 40 mg daily (day 2/5) - f/u ambulatory pulse ox - f/u alpha-1-antitrypsin  - Goal O2 88-92%  #HIV 4/9 Labs showed CD4 93 and viral load 104. Follows with ID with LOV 07/27/22. Continue home medications.  - Continue Symtuza, Tivicay and Bactrim    #Likely CKD IIIA Creatinine stable at 1.37 today. Renal function has varied between 1.31-1.61 over the past year, likely in the setting of CKD but etiology unclear at this time. UA was unremarkable, UPCR 0.28. Will continue monitoring renal function.  - Trend BMP   #T2DM A1c 5.9% in April, not on antidiabetic medication at home. AM glucose 193 while on prednisone. Will start on sensitive SSI with meals. - Sensitive SSI with meals   #HFpEF BNP 12.4 on admission. Echo 11/2021 showed EF 50%, no RWMA. Cardiology saw him in 11/2021 and noted EF fluctuated 45-60% and did not recommend therapy at that time. Appears euvolemic on exam today. Continue monitor volume status.    #SDOH Informed patient that Anora Ellipta inhaler is available for $0  copay with 90 day fill. I anticipate better medication compliance in the future given cost no longer an issue. Will also consider starting beclomethasone inhaler at discharge, so appreciate pharmacy team assistance with pricing.   Diet: Renal IVF: None VTE: Enoxaparin Code: Full TOC recs: pending Family Update: no family at bedside  this morning    Dispo: Anticipated discharge to home pending oxygen requirement and medical stability.   Whitman Hero, Medical Student 08/21/2022, 10:54 AM On call pager: [phone]

## 2022-08-21 NOTE — Progress Notes (Signed)
Patient walked on the hallway with 2 lit oxygen on. SATs dropped to 85%. Primary nurse increased  oxygen to 4 lit and O2 SATs was 90%.

## 2022-08-22 ENCOUNTER — Other Ambulatory Visit (HOSPITAL_COMMUNITY): Payer: Self-pay

## 2022-08-22 DIAGNOSIS — I5020 Unspecified systolic (congestive) heart failure: Secondary | ICD-10-CM

## 2022-08-22 LAB — ALPHA-1-ANTITRYPSIN: A-1 Antitrypsin, Ser: 125 mg/dL (ref 101–187)

## 2022-08-22 LAB — BASIC METABOLIC PANEL
Anion gap: 7 (ref 5–15)
BUN: 27 mg/dL — ABNORMAL HIGH (ref 6–20)
CO2: 30 mmol/L (ref 22–32)
Calcium: 8.9 mg/dL (ref 8.9–10.3)
Chloride: 97 mmol/L — ABNORMAL LOW (ref 98–111)
Creatinine, Ser: 1.19 mg/dL (ref 0.61–1.24)
GFR, Estimated: >60 mL/min (ref >60)
Glucose, Bld: 106 mg/dL — ABNORMAL HIGH (ref 70–99)
Potassium: 4.1 mmol/L (ref 3.5–5.1)
Sodium: 134 mmol/L — ABNORMAL LOW (ref 135–145)

## 2022-08-22 LAB — CULTURE, BLOOD (ROUTINE X 2): Culture: NO GROWTH

## 2022-08-22 LAB — GLUCOSE, CAPILLARY
Glucose-Capillary: 132 mg/dL — ABNORMAL HIGH (ref 70–99)
Glucose-Capillary: 142 mg/dL — ABNORMAL HIGH (ref 70–99)

## 2022-08-22 MED ORDER — HYDROCOD POLI-CHLORPHE POLI ER 10-8 MG/5ML PO SUER
5.0000 mL | Freq: Two times a day (BID) | ORAL | 0 refills | Status: AC
  Filled 2022-08-22: qty 70, 7d supply, fill #0

## 2022-08-22 MED ORDER — IPRATROPIUM-ALBUTEROL 0.5-2.5 (3) MG/3ML IN SOLN: 3.0000 mL | Freq: Four times a day (QID) | RESPIRATORY_TRACT | Status: DC | PRN

## 2022-08-22 MED ORDER — ANORO ELLIPTA 62.5-25 MCG/ACT IN AEPB
1.0000 | INHALATION_SPRAY | Freq: Every day | RESPIRATORY_TRACT | 0 refills | Status: DC
  Filled 2022-08-22: qty 60, 60d supply, fill #0

## 2022-08-22 MED ORDER — PREDNISONE 20 MG PO TABS
40.0000 mg | ORAL_TABLET | Freq: Every day | ORAL | 0 refills | Status: DC
  Filled 2022-08-22: qty 2, 1d supply, fill #0

## 2022-08-22 NOTE — Discharge Summary (Signed)
Name: BERNADETTE TIRELLA MRN: [id number] DOB: 05/26/1970 52 y.o. PCP: Quintella Reichert, MD  Date of Admission: 08/19/2022  5:48 AM Date of Discharge:  08/22/2022 Attending Physician: Dr.  Lafonda Mosses  DISCHARGE DIAGNOSIS:  Primary Problem: Acute on chronic hypoxic respiratory failure [place])   Hospital Problems: Principal Problem:   Acute on chronic hypoxic respiratory failure ([name])    DISCHARGE MEDICATIONS:   Allergies as of 08/22/2022       Reactions   Metformin And Related Rash        Medication List     STOP taking these medications    cetirizine 5 MG tablet Commonly known as: ZYRTEC   triamcinolone ointment 0.1 % Commonly known as: KENALOG       TAKE these medications    albuterol 108 (90 Base) MCG/ACT inhaler Commonly known as: VENTOLIN HFA Inhale 2 puffs into the lungs every 6 (six) hours as needed for wheezing or shortness of breath.   Anoro Ellipta 62.5-25 MCG/ACT Aepb Generic drug: umeclidinium-vilanterol Inhale 1 puff into the lungs daily.   aspirin EC 81 MG tablet Take 1 tablet (81 mg total) by mouth daily. Swallow whole.   chlorpheniramine-HYDROcodone 10-8 MG/5ML Commonly known as: TUSSIONEX Take 5 mLs by mouth every 12 (twelve) hours for 7 days.   guaiFENesin-dextromethorphan 100-10 MG/5ML syrup Commonly known as: ROBITUSSIN DM Take 5 mLs by mouth every 4 (four) hours as needed for cough.   predniSONE 20 MG tablet Commonly known as: DELTASONE Take 2 tablets (40 mg total) by mouth daily with breakfast. Start taking on: August 23, 2022   Qvar RediHaler 80 MCG/ACT inhaler Generic drug: beclomethasone Inhale 1 puff into the lungs 2 (two) times daily.   rosuvastatin 20 MG tablet Commonly known as: Crestor Take 1 tablet (20 mg total) by mouth daily.   sulfamethoxazole-trimethoprim 800-160 MG tablet Commonly known as: BACTRIM DS Take 1 tablet by mouth daily. What changed: additional instructions   Symtuza 800-150-200-10 MG Tabs Generic drug:  Darunavir-Cobicistat-Emtricitabine-Tenofovir Alafenamide Take 1 tablet by mouth daily with breakfast.   Tivicay 50 MG tablet Generic drug: dolutegravir Take 1 tablet (50 mg total) by mouth daily.   vitamin D3 25 MCG tablet Commonly known as: CHOLECALCIFEROL Take 1 tablet (1,000 Units total) by mouth daily.        DISPOSITION AND FOLLOW-UP:  Mr.[name] P Froman was discharged from [facility] in good condition. At the hospital follow up visit please address:  Follow-up Recommendations: COPD: Ensure patient is taking Anora Ellipta 1 puff daily, Qvar RediHaler 1 puff BID, and albuterol inhaler 2 puffs Q6h PRN. Tussionex may also be continued if it helps with cough. Assess home oxygen requirements to see if at baseline of 2L [place] PRN.  AKI: Posey Rea is patient has some underlying CKD given the fluctuations in his renal function over the past year. Recommend checking BMP periodically to assess renal function.  HIV: Ensure patient is taking Symtuza, Tivicay, and Bactrim. He should continue to see Infectious Disease (Dr. Drue Second) as outpatient.  T2DM: A1c 5.9% in 06/2022. Patient not on antidiabetic medication at home. Continue to recheck A1c every 6 months.  Labs: Basic Metabolic Profile Medications: Anora Ellipta, Qvar RediHaler, albuterol inhaler, Tussionex, prednisone (1 more dose)  Follow-up Appointments:  Follow-up Information     Merrilyn Puma, MD .   Specialty: Internal Medicine Contact information: [address] [place] [address] [phone]         [employer] Follow up.   Why:  Please call [employer] for any additional oxygen supplies for home Contact information: [address] [place] [address] [phone]                 HOSPITAL COURSE:  Ahmare Moralas is a 52 year-old with a pertinent PMH of COPD on 2L [place] PRN at home, bronchiectasis, HIV, and T2DM who presented with SOB and cough and admitted on 08/19/2022 for acute  on chronic hypoxic respiratory failure secondary to COPD exacerbation.    #Acute on chronic hypoxic respiratory failure 2/2 COPD exacerbation from medication non-adherence #Chronic bronchiectasis and emphysema  Patient presented with a 4-day history of non-productive cough and progressively worsening shortness of breath. Symptoms were consistent with a COPD exacerbation in the setting of inconsistent inhaler use. Infection was not suspected given he remains afebrile without leukocytosis. Patient received Anora Ellipta inhaler 1 puff daily, DuoNebs Q4h, azithromycin 500 mg x 3 days, steroids x 4 days, and Tussionex Q12h while hospitalized. Patient initially had increased oxygen requirement up to 6L [place] but was saturating well on baseline of 2L [place] on day of discharge. Patient overall is feeling much better. Will discharge patient with Anora Ellipta, beclomethasone, and albuterol inhalers in addition to Tussionex and one additional dose of prednisone 40 mg. He will follow up with the [facility] on 6/13.    #AKI, resolved Creatinine elevated to 1.48 on admission. UA was unremarkable, UPCR 0.28. Renal function improved by day of discharge with creatinine of 1.19. Unsure is patient has some underlying CKD given renal function has varied between 1.31-1.61 over the past year. Recommend continued follow-up outpatient.  #HIV 4/9 Labs showed CD4 93 and viral load 104. Follows with ID with LOV 07/27/22. Home Symtuza, Tivicay and Bactrim were continued during hospitalization and will continue at discharge.   #T2DM A1c 5.9% in 06/2022. Patient not on antidiabetic medication at home. Blood glucose was mildly elevated while on steroids, so sensitive SSI was started during hospitalization. No new medications will be started at discharge.   #HFpEF BNP 12.4 on admission. Echo 11/2021 showed EF 50%, no RWMA. Cardiology saw him in 11/2021 and noted EF fluctuated 45-60%, no therapy recommend at that time. Patient remained euvolemic  throughout hospitalization. No new medications will be started at discharge.   #SDOH Patient can obtain 90-day fill of Anora Ellipta and beclomethasone inhaler for $0 copay. I anticipate better medication compliance in the future given cost no longer an issue.  DISCHARGE INSTRUCTIONS:   Discharge Instructions     Call MD for:  difficulty breathing, headache or visual disturbances   Complete by: As directed    Call MD for:  persistant dizziness or light-headedness   Complete by: As directed    Call MD for:  temperature >100.4   Complete by: As directed    Diet - low sodium heart healthy   Complete by: As directed    Discharge instructions   Complete by: As directed    Dear Mr. Kacyn, Bastien were hospitalized because of your lung condition. You will need to take 3 inhalers at home:  1. Anoro Ellipta: Use this inhaler 1 puff a day everyday 2. QVAR: Use this inhaler everyday: 1 puff twice a day 3. Albuterol: Use this inhaler every 6 hours, as needed, for shortness of breath. You can use 2 puffs at a time  We have also given you one more dose of prednisone 40 mg. You should take this pill tomorrow morning.   Additionally, we have given you the  cough syrup. You can take 5 mL by mouth twice a day for the next week.   Your doctor appointment is on 6/13 at 8:45 AM with Dr. Austin Miles. Our clinic is located on the ground floor of the hospital. This appointment is very important so we can check to see how you are doing and refill your inhalers. If you have any questions or concerns, call our clinic at [phone] or after hours call [phone] and ask for the internal medicine resident on call.   Increase activity slowly   Complete by: As directed        SUBJECTIVE:  No significant overnight events. Patient's breathing and cough continue to improve. He felt about at baseline when walking with the RN yesterday, and he is feeling well this morning. He reports the Tussionex significantly  helps his cough, and he would like to continue that at discharge. Discussed plan to discharge today with 3 inhalers in addition to Tussionex. Emphasized importance of following up with PCP on 6/13. All questions and concerns were addressed.  Discharge Vitals:   BP 116/77 (BP Location: Left Arm)   Pulse 84   Temp 98.4 F (36.9 C)   Resp 14   Ht 5\' 7"  (1.702 m)   Wt 78 kg   SpO2 96%   BMI 26.93 kg/m   OBJECTIVE:  Physical Exam: Constitutional: Patient lying comfortably in bed watching TV. No acute distress. Cardiovascular: Regular rate, regular rhythm. No murmurs, rubs, or gallops.  Pulmonary: Normal respiratory effort on 2L [place]. Able to speak in full sentences without pauses. Good air movement bilaterally with occasional rhonchi. Abdominal: Soft. Non-distended. Normal bowel sounds. Musculoskeletal: Normal muscle bulk. No LE edema.   Neurological: Alert and oriented to person, place, and time. Skin: Warm and dry.  Pertinent Labs, Studies, and Procedures:     Latest Ref Rng & Units 08/21/2022   10:45 AM 08/20/2022    7:55 AM 08/19/2022    6:32 AM  CBC  WBC 4.0 - 10.5 K/uL 6.0  8.4  4.3   Hemoglobin 13.0 - 17.0 g/dL 29.5  62.1  30.8   Hematocrit 39.0 - 52.0 % 43.6  43.9  43.4   Platelets 150 - 400 K/uL 156  167  152        Latest Ref Rng & Units 08/22/2022    3:45 AM 08/21/2022   10:45 AM 08/20/2022    7:55 AM  CMP  Glucose 70 - 99 mg/dL 657  846  962   BUN 6 - 20 mg/dL 27  31  25    Creatinine 0.61 - 1.24 mg/dL 9.52  8.41  3.24   Sodium 135 - 145 mmol/L 134  132  135   Potassium 3.5 - 5.1 mmol/L 4.1  3.8  4.7   Chloride 98 - 111 mmol/L 97  95  100   CO2 22 - 32 mmol/L 30  28  27    Calcium 8.9 - 10.3 mg/dL 8.9  8.8  9.3     CT Angio Chest PE W and/or Wo Contrast  Result Date: 08/19/2022 CLINICAL DATA:  Chronic lung opacities due to bronchiectasis on recent chest radiographs, making it difficult to exclude superimposed pneumonia. The patient has shortness of breath. EXAM: CT  ANGIOGRAPHY CHEST WITH CONTRAST TECHNIQUE: Multidetector CT imaging of the chest was performed using the standard protocol during bolus administration of intravenous contrast. Multiplanar CT image reconstructions and MIPs were obtained to evaluate the vascular anatomy. RADIATION DOSE REDUCTION: This exam was performed  according to the departmental dose-optimization program which includes automated exposure control, adjustment of the mA and/or kV according to patient size and/or use of iterative reconstruction technique. CONTRAST:  75mL OMNIPAQUE IOHEXOL 350 MG/ML SOLN COMPARISON:  Chest radiographs dated 08/19/2022 and 06/27/2022. Chest CT dated 06/28/2022. Chest CTA dated 08/22/2021. FINDINGS: Cardiovascular: Satisfactory opacification of the pulmonary arteries to the segmental level. No evidence of pulmonary embolism. Normal heart size. No pericardial effusion. Mediastinum/Nodes: Since 08/22/2021, no significant change in mildly enlarged bilateral hilar lymph nodes. The largest is in the right hilum, with a short axis diameter of 13 mm on image number 58/5. No enlarged mediastinal nodes. Small hiatal hernia. Unremarkable esophagus and thyroid gland. Lungs/Pleura: Stable bilateral bullous changes including centrilobular and paraseptal emphysema. Stable pleural and parenchymal scarring at both lung apices. A previously demonstrated area of residual consolidation in the inferior right lower lobe has further improved with a small amount of residual atelectasis or scarring. Areas of bronchiectasis in the left lung are unchanged with improved mucous plugging and wall thickening. There is a persistent oval nodular area of probable mucous plugging posteriorly in the left lower lobe which has been present on previous examinations dating back at least 02/24/2016. This does not need follow-up. Associated chronic parenchymal densities on the left are also less prominent. No new airspace consolidation.  No pleural fluid. Upper  Abdomen: Unremarkable. Musculoskeletal: Unremarkable bones. Review of the MIP images confirms the above findings. IMPRESSION: 1. No pulmonary emboli or acute abnormality. 2. Stable changes of COPD with centrilobular and paraseptal emphysema. 3. Stable areas of bronchiectasis in the left lung with improved mucous plugging and wall thickening. 4. Improved area of residual consolidation in the inferior right lower lobe with a small amount of residual atelectasis or scarring. 5. Stable mildly enlarged bilateral hilar lymph nodes, most likely reactive. 6. Small hiatal hernia. Emphysema (ICD10-J43.9). Electronically Signed   By: Beckie Salts M.D.   On: 08/19/2022 10:06   DG Chest Port 1 View  Result Date: 08/19/2022 CLINICAL DATA:  Shortness of breath EXAM: PORTABLE CHEST 1 VIEW COMPARISON:  06/27/2022 FINDINGS: Indistinct pulmonary opacity on the left more than right. Chronic right more than left apical reticulation and bleb formation. Stable heart size and mediastinal contours. IMPRESSION: Chronic left more than right pulmonary opacity attributable to the patient's bronchiectasis. Superimposed pneumonia could easily be obscured. Electronically Signed   By: Tiburcio Pea M.D.   On: 08/19/2022 06:32     Signed: Whitman Hero 08/22/2022, 12:11 PM  Attestation for Student Documentation:  I personally was present and performed or re-performed the history, physical exam and medical decision-making activities of this service and have verified that the service and findings are accurately documented in the student's note.  Chauncey Mann, DO 08/22/2022, 1:14 PM

## 2022-08-23 LAB — CULTURE, BLOOD (ROUTINE X 2): Culture: NO GROWTH

## 2022-08-24 ENCOUNTER — Telehealth: Payer: Self-pay

## 2022-08-24 LAB — CULTURE, BLOOD (ROUTINE X 2): Special Requests: ADEQUATE

## 2022-08-24 NOTE — Transitions of Care (Post Inpatient/ED Visit) (Signed)
08/24/2022  Name: [name] MRN: [id number] DOB: Nov 18, 1970  Today's TOC FU Call Status: Today's TOC FU Call Status:: Successful TOC FU Call Competed TOC FU Call Complete Date: 08/24/22  Transition Care Management Follow-up Telephone Call Date of Discharge: 08/22/22 Discharge Facility: [facility]) Type of Discharge: Inpatient Admission Primary Inpatient Discharge Diagnosis:: COPD How have you been since you were released from the hospital?: Better Any questions or concerns?: No  Items Reviewed: Did you receive and understand the discharge instructions provided?: No Medications obtained,verified, and reconciled?: Yes (Medications Reviewed) Any new allergies since your discharge?: No Dietary orders reviewed?: Yes Do you have support at home?: Yes People in Home: child(ren), adult  Medications Reviewed Today: Medications Reviewed Today     Reviewed by Karena Addison, LPN (Licensed Practical Nurse) on 08/24/22 at [phone]  Med List Status: <None>   Medication Order Taking? Sig Documenting Provider Last Dose Status Informant  albuterol (VENTOLIN HFA) 108 (90 Base) MCG/ACT inhaler [id number] Yes Inhale 2 puffs into the lungs every 6 (six) hours as needed for wheezing or shortness of breath. Rana Snare, DO Taking Active   aspirin EC 81 MG tablet [id number] No Take 1 tablet (81 mg total) by mouth daily. Swallow whole.  Patient not taking: Reported on 06/27/2022   Doran Stabler, DO Not Taking Active Self, Pharmacy Records  beclomethasone (QVAR) 80 MCG/ACT inhaler [id number] Yes Inhale 1 puff into the lungs 2 (two) times daily. Rana Snare, DO Taking Active   chlorpheniramine-HYDROcodone (TUSSIONEX) 10-8 MG/5ML [id number] Yes Take 5 mLs by mouth every 12 (twelve) hours for 7 days. Chauncey Mann, DO Taking Active   Darunavir-Cobicistat-Emtricitabine-Tenofovir Alafenamide (SYMTUZA) 800-150-200-10 MG TABS [id number] No Take 1 tablet by mouth daily with breakfast.  Patient not  taking: Reported on 08/19/2022   Robinette Haines, RPH-CPP Not Taking Active Self, Pharmacy Records           Med Note (COFFELL, Marzella Schlein   Wed Aug 19, 2022 12:14 PM) Pt states he is only taking Tivicay. Per dispense report, pt has been getting this medication + Tivicay filled every month.  dolutegravir (TIVICAY) 50 MG tablet [id number] Yes Take 1 tablet (50 mg total) by mouth daily. Kuppelweiser, Cassie L, RPH-CPP Taking Active Self, Pharmacy Records  guaiFENesin-dextromethorphan [facility] DM) 100-10 MG/5ML syrup [id number] No Take 5 mLs by mouth every 4 (four) hours as needed for cough.  Patient not taking: Reported on 06/27/2022   Judyann Munson, MD Not Taking Active Self, Pharmacy Records  predniSONE (DELTASONE) 20 MG tablet [id number] Yes Take 2 tablets (40 mg total) by mouth daily with breakfast. Atway, Rayann N, DO Taking Active   rosuvastatin (CRESTOR) 20 MG tablet [id number] No Take 1 tablet (20 mg total) by mouth daily.  Patient not taking: Reported on 06/27/2022   Judyann Munson, MD Not Taking Active Self, Pharmacy Records  sulfamethoxazole-trimethoprim (BACTRIM DS) 800-160 MG tablet [id number] No Take 1 tablet by mouth daily.  Patient not taking: Reported on 08/24/2022   Judyann Munson, MD Not Taking Active Self, Pharmacy Records  umeclidinium-vilanterol [facility] ELLIPTA) 62.5-25 MCG/ACT AEPB [id number] Yes Inhale 1 puff into the lungs daily. Chauncey Mann, DO Taking Active   vitamin D3 (CHOLECALCIFEROL) 25 MCG tablet [id number] No Take 1 tablet (1,000 Units total) by mouth daily.  Patient not taking: Reported on 07/27/2022   Steffanie Rainwater, MD Not Taking Active Self, Pharmacy Records            Home Care and Equipment/Supplies: Were Home Health  Services Ordered?: NA Any new equipment or medical supplies ordered?: Yes Name of Medical supply agency?: [employer] Were you able to get the equipment/medical supplies?: Yes Do you have any questions related to the use of the  equipment/supplies?: No  Functional Questionnaire: Do you need assistance with bathing/showering or dressing?: No Do you need assistance with meal preparation?: No Do you need assistance with eating?: No Do you have difficulty maintaining continence: No Do you need assistance with getting out of bed/getting out of a chair/moving?: No Do you have difficulty managing or taking your medications?: No  Follow up appointments reviewed: PCP Follow-up appointment confirmed?: Yes Date of PCP follow-up appointment?: 08/27/22 Follow-up Provider: [facility] Follow-up appointment confirmed?: NA Do you need transportation to your follow-up appointment?: No Do you understand care options if your condition(s) worsen?: Yes-patient verbalized understanding    SIGNATURE Karena Addison, LPN [facility] Nurse Health Advisor Direct Dial [phone]

## 2022-08-27 ENCOUNTER — Encounter: Payer: 59 | Admitting: Student

## 2022-09-07 ENCOUNTER — Encounter: Payer: 59 | Admitting: Student

## 2022-09-08 ENCOUNTER — Telehealth: Payer: Self-pay

## 2022-09-08 ENCOUNTER — Encounter: Payer: Self-pay | Admitting: Student

## 2022-09-08 ENCOUNTER — Ambulatory Visit (INDEPENDENT_AMBULATORY_CARE_PROVIDER_SITE_OTHER): Payer: 59 | Admitting: Student

## 2022-09-08 VITALS — BP 132/83 | HR 81 | Temp 98.0°F | Ht 67.0 in | Wt 193.9 lb

## 2022-09-08 DIAGNOSIS — J441 Chronic obstructive pulmonary disease with (acute) exacerbation: Secondary | ICD-10-CM

## 2022-09-08 DIAGNOSIS — N179 Acute kidney failure, unspecified: Secondary | ICD-10-CM | POA: Diagnosis not present

## 2022-09-08 DIAGNOSIS — Z87891 Personal history of nicotine dependence: Secondary | ICD-10-CM

## 2022-09-08 DIAGNOSIS — J189 Pneumonia, unspecified organism: Secondary | ICD-10-CM

## 2022-09-08 MED ORDER — ANORO ELLIPTA 62.5-25 MCG/ACT IN AEPB: 1.0000 | INHALATION_SPRAY | Freq: Every day | RESPIRATORY_TRACT | 0 refills | Status: DC

## 2022-09-08 MED ORDER — BECLOMETHASONE DIPROP HFA 80 MCG/ACT IN AERB: 1.0000 | INHALATION_SPRAY | Freq: Two times a day (BID) | RESPIRATORY_TRACT | 0 refills | Status: DC

## 2022-09-08 MED ORDER — ALBUTEROL SULFATE HFA 108 (90 BASE) MCG/ACT IN AERS: 2.0000 | INHALATION_SPRAY | Freq: Four times a day (QID) | RESPIRATORY_TRACT | 2 refills | Status: DC | PRN

## 2022-09-08 NOTE — Telephone Encounter (Signed)
Return pt's call who stated Ventolin inh cost $75.00, too expensive. Sending pt's concern to ordering doctor and Durward Mallard, pharm tech.

## 2022-09-08 NOTE — Assessment & Plan Note (Addendum)
Patient presents for follow-up from hospitalization earlier this month for a COPD exacerbation secondary to medication nonadherence.  He states he is doing well, and uses oxygen 2 L at home when necessary.  Inhaler therapy is currently Anoro Ellipta and Qvar Redihaler twice a day.  He states these inhalers worked for him, and feels as if his symptoms are and well-controlled.  He denies any recent fevers, chills, nausea, vomiting, episodes of shortness of breath.  Plan: - Will refill Anoro Ellipta and Qvar Redihaler - Reached out to pharmacy medication assist team to aid patient in obtaining medications if they are too expensive   ADDENDUM:  6/28: Have attempted to call patient multiple times about inhalers, no answer throughout the day

## 2022-09-08 NOTE — Telephone Encounter (Signed)
Pt states he cannot pick up his medication because it's too expensive. Please call pt back.

## 2022-09-08 NOTE — Progress Notes (Addendum)
CC: Hospital follow-up  HPI:  [name] is a 52 y.o. male living with a history stated below and presents today for hospital follow-up. Please see problem based assessment and plan for additional details.  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 Swallow whole. (Patient not taking: Reported on 06/27/2022) 150 tablet 2   Darunavir-Cobicistat-Emtricitabine-Tenofovir Alafenamide (SYMTUZA) 800-150-200-10 MG TABS Take 1 tablet by mouth daily with breakfast. (Patient not taking: Reported on 08/19/2022) 30 tablet 11   dolutegravir (TIVICAY) 50 MG tablet Take 1 tablet (50 mg total) by mouth daily. 30 tablet 11   guaiFENesin-dextromethorphan (ROBITUSSIN DM) 100-10 MG/5ML syrup Take 5 mLs by mouth every 4 (four) hours as needed for cough. (Patient not taking: Reported on 06/27/2022) 118 mL 1   predniSONE (DELTASONE) 20 MG tablet Take 2 tablets (40 mg total) by mouth daily with breakfast. 2 tablet 0   rosuvastatin (CRESTOR) 20 MG tablet Take 1 tablet (20 mg total) by mouth daily. (Patient not taking: Reported on 06/27/2022) 30 tablet 11   sulfamethoxazole-trimethoprim (BACTRIM DS) 800-160 MG  tablet Take 1 tablet by mouth daily. (Patient not taking: Reported on 08/24/2022) 30 tablet 11   vitamin D3 (CHOLECALCIFEROL) 25 MCG tablet Take 1 tablet (1,000 Units total) by mouth daily. (Patient not taking: Reported on 07/27/2022) 30 tablet 2   No current facility-administered medications on file prior to visit.    Family History  Problem Relation Age of Onset   Hypertension Other    Heart disease Sister     Social History   Socioeconomic History   Marital status: Widowed    Spouse name: Not on file   Number of children: Not on file   Years of education: Not on file   Highest education level: Not on file  Occupational History   Not on file  Tobacco Use   Smoking status: Former    Packs/day: 0.50    Years: 6.00    Additional pack years: 0.00    Total pack years: 3.00    Types: Cigarettes    Quit date: 03/16/2001    Years since quitting: 21.5   Smokeless tobacco: Never   Tobacco comments:    "quit smoking ~ 2003"  Vaping Use   Vaping Use: Never used  Substance and Sexual Activity   Alcohol use: Not Currently    Comment: drinks bottled beer intermittently   Drug use: No   Sexual activity: Not Currently    Partners: Female    Comment: declined condoms  Other Topics Concern   Not on file  Social History Narrative   As  of 09/18/2013:   -Arrived in [place]: September 05, 2013    -Refugee from [facility], spent 4 years in refugee camp in [place].    -Language: [name], requires Architect (speaks essentially no [place])   -Education: no formal education, previously worked as a Product/process development scientist: family phone number [phone], caseworker is Lannette Donath Dar Bi [phone] (with [employer])   -lives with daughter and three sons   -denies alcohol, drug, tobacco abuse      [facility] (02/28/16):   Patient now admitted to drinking bottled beer. Reports continued tobacco abstinence. Denies any bird or mold exposure. No pets currently. No recent travel.   Social  Determinants of Health   Financial Resource Strain: Not on file  Food Insecurity: No Food Insecurity (08/19/2022)   Hunger Vital Sign    Worried About Running Out of Food in the Last Year: Never true    Ran Out of Food in the Last Year: Never true  Transportation Needs: No Transportation Needs (08/19/2022)   PRAPARE - Administrator, Civil Service (Medical): No    Lack of Transportation (Non-Medical): No  Physical Activity: Not on file  Stress: Not on file  Social Connections: Not on file  Intimate Partner Violence: Not At Risk (08/19/2022)   Humiliation, Afraid, Rape, and Kick questionnaire    Fear of Current or Ex-Partner: No    Emotionally Abused: No    Physically Abused: No    Sexually Abused: No    Review of Systems: ROS negative except for what is noted on the assessment and plan.  Vitals:   09/08/22 0844  BP: 132/83  Pulse: 81  Temp: 98 F (36.7 C)  TempSrc: Oral  SpO2: 95%  Weight: 193 lb 14.4 oz (88 kg)  Height: 5\' 7"  (1.702 m)    Physical Exam: Constitutional: well-appearing male in no acute distress Eyes: conjunctiva non-erythematous Neck: supple Cardiovascular: regular rate and rhythm, no m/r/g Pulmonary/Chest: normal work of breathing on room air, mild decrease in breath sounds bilaterally Abdominal: soft, non-tender, non-distended Skin: warm and dry   Assessment & Plan:   COPD exacerbation ([name]) Patient presents for follow-up from hospitalization earlier this month for a COPD exacerbation secondary to medication nonadherence.  He states he is doing well, and uses oxygen 2 L at home when necessary.  Inhaler therapy is currently Anoro Ellipta and Qvar Redihaler twice a day.  He states these inhalers worked for him, and feels as if his symptoms are and well-controlled.  He denies any recent fevers, chills, nausea, vomiting, episodes of shortness of breath.  Plan: - Will refill Anoro Ellipta and Qvar Redihaler - Reached out to pharmacy medication  assist team to aid patient in obtaining medications if they are too expensive   ADDENDUM:  6/28: Have attempted to call patient multiple times about inhalers, no answer throughout the day  AKI (acute kidney injury) ([name]) In the hospital, patient was noted to have elevated creatinine of 1.3-1.6.  On discharge, creatinine resolved to normal limits of 1.19.  He denies any symptoms of decreased urine output.  Per chart review, seems to be questions of if he has underlying chronic kidney disease, so we will check BMP today to evaluate kidney function and evaluate if further steps need to be taken.  Plan: - BMP  ADDENDUM:  Creatinine at 1.44, unsure if this is dehydration or underlying CKD. Will reach out to front desk and further evaluate with urine microalbumin for CKD workup.  Patient discussed with Dr. Maryagnes Amos Kimila Papaleo, M.D. [facility] Internal Medicine, PGY-1 Pager: [phone] Date 09/11/2022 Time 3:02 PM

## 2022-09-08 NOTE — Patient Instructions (Addendum)
Thank you so much for coming to the clinic today!   I have sent refills for your medications, if they are too expensive please give [place] a call back and we will figure something out. We are also checking some blood work today as well, if there is anything abnormal I will give you a call.   If you have any questions please feel free to the call the clinic at anytime at [phone]. It was a pleasure seeing you!  Best, Dr. Varney Biles sana kwa [place] leo!   [place] kujaza dawa zako, kama ni ghali sana tafadhali tupigie simu tutajua kitu. Pia tunaangalia kazi ya damu leo pia, ikiwa [place] jambo lisilo la kawaida nitakupa simu.   Barnett Hatter maswali yoyote tafadhali jisikie huru Irena simu kwa kliniki wakati wowote kwa [phone]. Candis Shine ni furaha [place]!  Bora zaidi, Dk Lachrista Heslin

## 2022-09-08 NOTE — Assessment & Plan Note (Deleted)
Patient presents for follow-up from hospitalization earlier this month for a COPD exacerbation secondary to medication nonadherence.  He states he is doing well, and uses oxygen 2 L at home when necessary.  Inhaler therapy is currently Anoro Ellipta and Qvar Redihaler twice a day.  He states these inhalers worked for him, and feels as if his symptoms are and well-controlled.  He denies any recent fevers, chills, nausea, vomiting, episodes of shortness of breath.  Plan: - Will refill Anoro Ellipta and Qvar ready [place]er - Reached out to pharmacy medication assist team to aid patient in obtaining medications if they are too expensive

## 2022-09-08 NOTE — Assessment & Plan Note (Signed)
In the hospital, patient was noted to have elevated creatinine of 1.3-1.6.  On discharge, creatinine resolved to normal limits of 1.19.  He denies any symptoms of decreased urine output.  Per chart review, seems to be questions of if he has underlying chronic kidney disease, so we will check BMP today to evaluate kidney function and evaluate if further steps need to be taken.  Plan: - BMP  ADDENDUM:  Creatinine at 1.44, unsure if this is dehydration or underlying CKD. Will reach out to front desk and further evaluate with urine microalbumin for CKD workup.

## 2022-09-09 ENCOUNTER — Other Ambulatory Visit (HOSPITAL_COMMUNITY): Payer: Self-pay

## 2022-09-09 LAB — BMP8+ANION GAP
Anion Gap: 16 mmol/L (ref 10.0–18.0)
BUN/Creatinine Ratio: 15 (ref 9–20)
BUN: 22 mg/dL (ref 6–24)
CO2: 22 mmol/L (ref 20–29)
Calcium: 9.6 mg/dL (ref 8.7–10.2)
Chloride: 99 mmol/L (ref 96–106)
Creatinine, Ser: 1.44 mg/dL — ABNORMAL HIGH (ref 0.76–1.27)
Glucose: 134 mg/dL — ABNORMAL HIGH (ref 70–99)
Potassium: 4.2 mmol/L (ref 3.5–5.2)
Sodium: 137 mmol/L (ref 134–144)
eGFR: 59 mL/min/{1.73_m2} — ABNORMAL LOW (ref >59)

## 2022-09-09 NOTE — Progress Notes (Signed)
Internal Medicine Clinic Attending  Case discussed with Dr. Nooruddin  At the time of the visit.  We reviewed the resident's history and exam and pertinent patient test results.  I agree with the assessment, diagnosis, and plan of care documented in the resident's note.  

## 2022-09-09 NOTE — Addendum Note (Signed)
Addended byOlegario Messier on: 09/09/2022 11:03 AM   Modules accepted: Orders

## 2022-09-09 NOTE — Progress Notes (Signed)
Called and discussed with the patient the following things:  1.  He has both [place] Database administrator, which could be the reason why he has been having trouble obtaining his inhalers.  [employer] rep is trying to work this out, as they state that there is no real reason his Ventolin should be that expensive. 2.  He is agreeable to coming back in for a lab only visit to get his urine microalbumin 3.  I talked with his general surgeon office for the colostomy bag reversal, and they do have an availability for tomorrow 6/27, attempted to call patient back about this news x 4 and he did not pick up the phone any of the times.  Regardless, put him on the appointment list for August 8th.

## 2022-09-09 NOTE — Telephone Encounter (Signed)
Received call from Lennox Laity, RN CM with [employer], and 4 issues were discussed:  She will check with [facility] pharmacy to see why Ventolin is costing $75 She is asking about colostomy bags. Per OV note on 09/30/2021 patient was to return to General Surgeon to see if colostomy could be reversed. Contact info:    Cathie Beams, MD (Attending, Admitting) NPI: [id number] [phone] (Work) [phone] Engineer, production) [address] [facility] [place], [place] [address] General Surgery  3. She will contact patient via interpreter to schedule lab only visit for urine microalbumin for CKD workup.  4. Patient has been buying glucometer supplies out of pocket. Last A1c 5.9 on 07/23/22. Patient does not take any antidiabetes meds. No need to check CBGs. She will inform patient of this.

## 2022-09-10 ENCOUNTER — Other Ambulatory Visit (HOSPITAL_COMMUNITY): Payer: Self-pay

## 2022-09-10 ENCOUNTER — Telehealth: Payer: Self-pay | Admitting: *Deleted

## 2022-09-10 NOTE — Telephone Encounter (Signed)
Attempted to run a few test claims for patients inhalers. Being that they were filled and picked up, I was unable to see prices.  Unsure of ventolin/proventil copay issue. Could be a deductible issue. However there is a generic that should be cheaper out of pocket (if not covered).   In the future, for Qvar alternatives, I could see that patients insurance covers Arnuity and Flovent at no charge.

## 2022-09-10 NOTE — Telephone Encounter (Signed)
Call from patient stated that he is unable to afford the Anoro for his Asthma.  Patient would like to get something else.  Send medication change to the [facility] on [address].

## 2022-09-11 ENCOUNTER — Other Ambulatory Visit: Payer: Self-pay | Admitting: Student

## 2022-09-11 ENCOUNTER — Other Ambulatory Visit (HOSPITAL_COMMUNITY): Payer: Self-pay

## 2022-09-11 NOTE — Telephone Encounter (Signed)
I tried calling him a couple times, no answer. I'm not sure what inhalers he has currently. Did the ones I sent get picked up?

## 2022-09-14 ENCOUNTER — Other Ambulatory Visit (HOSPITAL_COMMUNITY): Payer: Self-pay

## 2022-10-08 ENCOUNTER — Other Ambulatory Visit: Payer: 59

## 2022-10-08 DIAGNOSIS — N179 Acute kidney failure, unspecified: Secondary | ICD-10-CM

## 2022-10-22 ENCOUNTER — Other Ambulatory Visit: Payer: Self-pay

## 2022-10-22 ENCOUNTER — Other Ambulatory Visit (HOSPITAL_COMMUNITY): Payer: Self-pay

## 2022-10-29 ENCOUNTER — Other Ambulatory Visit (HOSPITAL_COMMUNITY): Payer: Self-pay

## 2022-10-29 ENCOUNTER — Telehealth: Payer: Self-pay

## 2022-10-29 NOTE — Telephone Encounter (Signed)
I attempted to contact [name] regarding financial renewal. A voicemail was left via the interpreter.

## 2022-11-02 ENCOUNTER — Other Ambulatory Visit: Payer: Self-pay | Admitting: Student

## 2022-11-02 ENCOUNTER — Other Ambulatory Visit: Payer: Self-pay

## 2022-11-02 ENCOUNTER — Ambulatory Visit: Payer: 59

## 2022-11-02 ENCOUNTER — Ambulatory Visit: Payer: 59 | Admitting: Internal Medicine

## 2022-11-02 ENCOUNTER — Other Ambulatory Visit (HOSPITAL_COMMUNITY): Payer: Self-pay

## 2022-11-02 DIAGNOSIS — J449 Chronic obstructive pulmonary disease, unspecified: Secondary | ICD-10-CM

## 2022-11-02 DIAGNOSIS — B20 Human immunodeficiency virus [HIV] disease: Secondary | ICD-10-CM

## 2022-11-02 MED ORDER — ALBUTEROL SULFATE HFA 108 (90 BASE) MCG/ACT IN AERS
2.0000 | INHALATION_SPRAY | Freq: Four times a day (QID) | RESPIRATORY_TRACT | 2 refills | Status: DC | PRN
  Filled 2022-11-02: qty 6.7, 25d supply, fill #0
  Filled 2022-12-04: qty 6.7, 25d supply, fill #1
  Filled 2022-12-21 – 2022-12-28 (×2): qty 6.7, 25d supply, fill #2
  Filled ????-??-?? (×2): fill #2

## 2022-11-02 MED ORDER — BECLOMETHASONE DIPROP HFA 80 MCG/ACT IN AERB
1.0000 | INHALATION_SPRAY | Freq: Two times a day (BID) | RESPIRATORY_TRACT | 1 refills | Status: DC
Start: 2022-11-02 — End: 2022-12-04
  Filled 2022-11-02: qty 10.6, 30d supply, fill #0
  Filled 2022-12-02: qty 10.6, 30d supply, fill #1

## 2022-11-02 MED ORDER — BECLOMETHASONE DIPROP HFA 80 MCG/ACT IN AERB
1.0000 | INHALATION_SPRAY | Freq: Two times a day (BID) | RESPIRATORY_TRACT | 0 refills | Status: DC
Start: 2022-11-02 — End: 2022-11-02

## 2022-11-02 MED ORDER — SYMTUZA 800-150-200-10 MG PO TABS
1.0000 | ORAL_TABLET | Freq: Every day | ORAL | 11 refills | Status: DC
Start: 2022-11-02 — End: 2023-02-15
  Filled 2022-11-02: qty 30, 30d supply, fill #0
  Filled 2022-12-02 (×2): qty 30, 30d supply, fill #1
  Filled 2023-01-20: qty 30, 30d supply, fill #2
  Filled 2023-02-04: qty 30, 30d supply, fill #3

## 2022-11-02 NOTE — Addendum Note (Signed)
Addended by: Manuela Neptune on: 11/02/2022 03:35 PM   Modules accepted: Orders

## 2022-11-02 NOTE — Progress Notes (Signed)
Needed refill for Qvar. Refilled.

## 2022-11-03 ENCOUNTER — Other Ambulatory Visit: Payer: Self-pay

## 2022-11-03 ENCOUNTER — Other Ambulatory Visit (HOSPITAL_COMMUNITY): Payer: Self-pay

## 2022-11-05 ENCOUNTER — Telehealth: Payer: Self-pay

## 2022-11-05 NOTE — Telephone Encounter (Signed)
[facility] Patient Advocate Encounter  Patient's medications have been couriered to [facility] from [facility] and will be picked up 12/03/22.  Dema Severin , Sulfa   Clearance Coots , CPhT Specialty Pharmacy Patient [facility] for Infectious Disease Phone: [phone] Fax:  [phone]

## 2022-11-30 ENCOUNTER — Ambulatory Visit: Payer: 59 | Admitting: Internal Medicine

## 2022-12-02 ENCOUNTER — Other Ambulatory Visit (HOSPITAL_COMMUNITY): Payer: Self-pay

## 2022-12-03 ENCOUNTER — Other Ambulatory Visit: Payer: Self-pay

## 2022-12-03 ENCOUNTER — Other Ambulatory Visit (HOSPITAL_COMMUNITY): Payer: Self-pay

## 2022-12-04 ENCOUNTER — Telehealth: Payer: Self-pay

## 2022-12-04 ENCOUNTER — Other Ambulatory Visit (HOSPITAL_COMMUNITY): Payer: Self-pay

## 2022-12-04 ENCOUNTER — Other Ambulatory Visit: Payer: Self-pay | Admitting: Student

## 2022-12-04 DIAGNOSIS — J449 Chronic obstructive pulmonary disease, unspecified: Secondary | ICD-10-CM

## 2022-12-04 NOTE — Telephone Encounter (Signed)
[facility] Patient Advocate Encounter  Patient's medications have been couriered to [facility] from [facility] and will be picked up 12/08/22.  Roselyn Reef, Symtuza, Qvar  Clearance Coots , CPhT Specialty Pharmacy Patient [facility] for Infectious Disease Phone: [phone] Fax:  [phone]

## 2022-12-05 ENCOUNTER — Other Ambulatory Visit (HOSPITAL_COMMUNITY): Payer: Self-pay

## 2022-12-08 ENCOUNTER — Other Ambulatory Visit: Payer: Self-pay

## 2022-12-08 MED ORDER — QVAR REDIHALER 80 MCG/ACT IN AERB
1.0000 | INHALATION_SPRAY | Freq: Two times a day (BID) | RESPIRATORY_TRACT | 1 refills | Status: DC
  Filled 2022-12-08 – 2022-12-21 (×2): qty 10.6, 30d supply, fill #0
  Filled 2023-01-13: qty 10.6, 25d supply, fill #0
  Filled 2023-02-02 (×2): qty 10.6, 25d supply, fill #1

## 2022-12-21 ENCOUNTER — Telehealth: Payer: Self-pay | Admitting: *Deleted

## 2022-12-21 ENCOUNTER — Other Ambulatory Visit (HOSPITAL_COMMUNITY): Payer: Self-pay

## 2022-12-21 ENCOUNTER — Other Ambulatory Visit: Payer: Self-pay

## 2022-12-21 ENCOUNTER — Telehealth: Payer: Self-pay

## 2022-12-21 ENCOUNTER — Other Ambulatory Visit (HOSPITAL_BASED_OUTPATIENT_CLINIC_OR_DEPARTMENT_OTHER): Payer: Self-pay

## 2022-12-21 NOTE — Telephone Encounter (Signed)
Medication was picked up at [facility] on  12/21/22  9:00AM

## 2022-12-21 NOTE — Telephone Encounter (Signed)
Patient her to see if he has refill on his Albuterol and Qvar. Use of [employer] interpreter Fathiya [id number].  Call to [facility] patient got refill on the Qvar 12/03/2022 and the Albuterol.  Patient is to early for refill per Arlys John. Patient stated had been taking as prescribed daily. Informed patient states he picks up meds at  the [facility].  Last Qvar was sent to the [facility] with his meds ordered from that  office.  Message was sent to D. August Saucer.per Arlys John.   Meds the Qvar as well as his [facility] meds have been awaiting pick up for 19 days.  Patient was advised to go to the [facility] to pick up his meds.

## 2022-12-29 ENCOUNTER — Other Ambulatory Visit (HOSPITAL_COMMUNITY): Payer: Self-pay

## 2023-01-06 ENCOUNTER — Other Ambulatory Visit: Payer: Self-pay

## 2023-01-11 ENCOUNTER — Ambulatory Visit: Payer: Managed Care, Other (non HMO) | Admitting: Internal Medicine

## 2023-01-12 ENCOUNTER — Other Ambulatory Visit: Payer: Self-pay | Admitting: Pharmacist

## 2023-01-12 NOTE — Progress Notes (Signed)
Specialty Pharmacy Ongoing Clinical Assessment Note  [name] is a 52 y.o. male who is being followed by the specialty pharmacy service for RxSp HIV   Patient's specialty medication(s) reviewed today: No data recorded  Missed doses in the last 4 weeks: 1   Patient/Caregiver did not have any additional questions or concerns.   Therapeutic benefit summary: Patient is achieving benefit   Adverse events/side effects summary: No adverse events/side effects   Patient's therapy is appropriate to: Continue    Goals Addressed             This Visit's Progress    Achieve Undetectable HIV Viral Load < 20       Patient is on track. Patient will work on increased adherence.      Improve or maintain quality of life       Patient is on track. Patient will be monitored by provider to determine if a change in treatment plan is warranted.      Maintain optimal adherence to therapy       Patient is on track. Patient will work on increased adherence.         Follow up:  3 months  Lennox Dolberry L. Jannette Fogo, PharmD, BCIDP, AAHIVP, CPP Clinical Pharmacist Practitioner Infectious Diseases Clinical Pharmacist [facility] 01/12/2023, 1:21 PM t

## 2023-01-13 ENCOUNTER — Other Ambulatory Visit: Payer: Self-pay

## 2023-01-13 ENCOUNTER — Other Ambulatory Visit: Payer: Self-pay | Admitting: Student

## 2023-01-13 ENCOUNTER — Other Ambulatory Visit (HOSPITAL_COMMUNITY): Payer: Self-pay

## 2023-01-18 ENCOUNTER — Emergency Department (HOSPITAL_COMMUNITY): Payer: Commercial Managed Care - HMO

## 2023-01-18 ENCOUNTER — Encounter (HOSPITAL_COMMUNITY): Payer: Self-pay

## 2023-01-18 ENCOUNTER — Telehealth: Payer: Self-pay

## 2023-01-18 ENCOUNTER — Emergency Department (HOSPITAL_COMMUNITY)
Admission: EM | Admit: 2023-01-18 | Discharge: 2023-01-18 | Disposition: A | Discharge status: Home / Self Care | Payer: Commercial Managed Care - HMO | Attending: Emergency Medicine | Admitting: Emergency Medicine

## 2023-01-18 ENCOUNTER — Other Ambulatory Visit: Payer: Self-pay

## 2023-01-18 DIAGNOSIS — Z87891 Personal history of nicotine dependence: Secondary | ICD-10-CM | POA: Insufficient documentation

## 2023-01-18 DIAGNOSIS — E1122 Type 2 diabetes mellitus with diabetic chronic kidney disease: Secondary | ICD-10-CM | POA: Insufficient documentation

## 2023-01-18 DIAGNOSIS — L299 Pruritus, unspecified: Secondary | ICD-10-CM | POA: Insufficient documentation

## 2023-01-18 DIAGNOSIS — Z21 Asymptomatic human immunodeficiency virus [HIV] infection status: Secondary | ICD-10-CM | POA: Insufficient documentation

## 2023-01-18 DIAGNOSIS — N182 Chronic kidney disease, stage 2 (mild): Secondary | ICD-10-CM | POA: Insufficient documentation

## 2023-01-18 LAB — BASIC METABOLIC PANEL
Anion gap: 9 (ref 5–15)
BUN: 19 mg/dL (ref 6–20)
CO2: 29 mmol/L (ref 22–32)
Calcium: 8.7 mg/dL — ABNORMAL LOW (ref 8.9–10.3)
Chloride: 102 mmol/L (ref 98–111)
Creatinine, Ser: 1.56 mg/dL — ABNORMAL HIGH (ref 0.61–1.24)
GFR, Estimated: 53 mL/min — ABNORMAL LOW (ref >60)
Glucose, Bld: 156 mg/dL — ABNORMAL HIGH (ref 70–99)
Potassium: 4.3 mmol/L (ref 3.5–5.1)
Sodium: 140 mmol/L (ref 135–145)

## 2023-01-18 LAB — CBC WITH DIFFERENTIAL/PLATELET
Abs Immature Granulocytes: 0.02 10*3/uL (ref 0.00–0.07)
Basophils Absolute: 0 10*3/uL (ref 0.0–0.1)
Basophils Relative: 0 %
Eosinophils Absolute: 0.5 10*3/uL (ref 0.0–0.5)
Eosinophils Relative: 11 %
HCT: 45.5 % (ref 39.0–52.0)
Hemoglobin: 14.7 g/dL (ref 13.0–17.0)
Immature Granulocytes: 0 %
Lymphocytes Relative: 39 %
Lymphs Abs: 1.9 10*3/uL (ref 0.7–4.0)
MCH: 32.2 pg (ref 26.0–34.0)
MCHC: 32.3 g/dL (ref 30.0–36.0)
MCV: 99.8 fL (ref 80.0–100.0)
Monocytes Absolute: 0.5 10*3/uL (ref 0.1–1.0)
Monocytes Relative: 9 %
Neutro Abs: 2 10*3/uL (ref 1.7–7.7)
Neutrophils Relative %: 41 %
Platelets: 171 10*3/uL (ref 150–400)
RBC: 4.56 MIL/uL (ref 4.22–5.81)
RDW: 11.9 % (ref 11.5–15.5)
WBC: 5 10*3/uL (ref 4.0–10.5)
nRBC: 0 % (ref 0.0–0.2)

## 2023-01-18 MED ORDER — LORAZEPAM 1 MG PO TABS
0.5000 mg | ORAL_TABLET | Freq: Once | ORAL | Status: AC
  Administered 2023-01-18: 0.5 mg via ORAL
  Filled 2023-01-18: qty 1

## 2023-01-18 MED ORDER — DEXAMETHASONE SODIUM PHOSPHATE 10 MG/ML IJ SOLN
10.0000 mg | Freq: Once | INTRAMUSCULAR | Status: AC
  Administered 2023-01-18: 10 mg via INTRAVENOUS
  Filled 2023-01-18: qty 1

## 2023-01-18 MED ORDER — FAMOTIDINE IN NACL 20-0.9 MG/50ML-% IV SOLN
20.0000 mg | Freq: Once | INTRAVENOUS | Status: AC
  Administered 2023-01-18: 20 mg via INTRAVENOUS
  Filled 2023-01-18: qty 50

## 2023-01-18 MED ORDER — TRIAMCINOLONE ACETONIDE 0.1 % EX OINT: 1.0000 | TOPICAL_OINTMENT | Freq: Two times a day (BID) | CUTANEOUS | 0 refills | Status: DC

## 2023-01-18 NOTE — Telephone Encounter (Signed)
Patient came in to request refill on Triamcinolone, needs to be sent to [facility] at [address], Please call when ready to be picked up, best contact number is [phone]

## 2023-01-18 NOTE — Addendum Note (Signed)
Addended by: Juanita Laster on: 01/18/2023 02:05 PM   Modules accepted: Orders

## 2023-01-18 NOTE — ED Notes (Addendum)
PA Mellody Dance at bedside with interpreter machine

## 2023-01-18 NOTE — ED Triage Notes (Signed)
Pt BIBEMS w/ c/o allergic reaction w/ hives & SOB, had similar prior. Denies any Hx of allergies.Given 5mg  Solumedrol, 0.3mg  Epi & 50 benadryl by EMS

## 2023-01-18 NOTE — ED Provider Notes (Signed)
  Physical Exam  BP 137/73 (BP Location: Right Arm)   Pulse 81   Temp 97.7 F (36.5 C) (Oral)   Resp 16   Ht 5\' 7"  (1.702 m)   Wt 89 kg   SpO2 97%   BMI 30.73 kg/m   Physical Exam Vitals and nursing note reviewed.  Constitutional:      General: He is not in acute distress.    Appearance: He is well-developed.  HENT:     Head: Normocephalic and atraumatic.  Eyes:     Conjunctiva/sclera: Conjunctivae normal.  Cardiovascular:     Rate and Rhythm: Normal rate and regular rhythm.     Heart sounds: No murmur heard. Pulmonary:     Effort: Pulmonary effort is normal. No respiratory distress.     Breath sounds: Normal breath sounds.  Abdominal:     Palpations: Abdomen is soft.     Tenderness: There is no abdominal tenderness.     Comments: Colostomy in place, no signs of infection  Musculoskeletal:        General: No swelling.     Cervical back: Neck supple.  Skin:    General: Skin is warm and dry.     Capillary Refill: Capillary refill takes less than 2 seconds.     Findings: Rash present. Rash is macular and papular.  Neurological:     Mental Status: He is alert.  Psychiatric:        Mood and Affect: Mood normal.        Behavior: Behavior is agitated.     Procedures  Procedures  ED Course / MDM    Medical Decision Making Amount and/or Complexity of Data Reviewed Labs: ordered. Radiology: ordered.  Risk Prescription drug management.  Patient care for patient at shift change from [facility], [place]. Patient has had pruritic rash for multiple days.  Patient has a history of pruritic dermatitis.  Patient has been given epinephrine, Benadryl, Decadron, and Pepcid since being in the ER.  On my evaluation patient was still paretic and mildly irritated.  Patient was given 0.5 Ativan.  Patient less agitated and resting in bed after Ativan.  Patient was evaluated using certified language interpreter.  After my evaluation, imaging, and labs which were all reassuring, I  feel this patient is safe to go home.  Patient should have follow-up care with dermatology and his primary care provider.      Dolphus Jenny, PA-C 01/18/23 [id number]    Margarita Grizzle, MD 01/19/23 [phone]

## 2023-01-18 NOTE — Discharge Instructions (Addendum)
Today you were seen for an allergic reaction.  Please make a follow-up appointment with dermatology if symptoms persist.  Thank you for letting us treat you today. After reviewing your labs and imaging, I feel you are safe to go home. Please follow up with your PCP in the next several days and provide them with your records from this visit. Return to the Emergency Room if pain becomes severe or symptoms worsen.

## 2023-01-18 NOTE — ED Provider Notes (Signed)
[place] EMERGENCY DEPARTMENT AT [facility] Provider Note   CSN: [id number] Arrival date & time: 01/18/23  4098     History  Chief Complaint  Patient presents with   Allergic Reaction    [name] is a 52 y.o. male.  Patient with past medical history significant for type II DM, HIV, AIDS, pulmonary emphysema, stage II CKD, status post colostomy, pruritic dermatitis/atopic dermatitis presents to the emergency department complaining of widespread pruritus.  Patient reports has been going on for days.  He feels itchy everywhere.  He is unable to isolated any 1 particular area.  He denies any swelling, difficulty breathing, GI symptoms.  Patient denies any environmental changes such as larger detergent, no new foods, no new medications.  He denies fevers or chills.   [name] interpreter utilized throughout encounter.    Allergic Reaction      Home Medications Prior to Admission medications   Medication Sig Start Date End Date Taking? Authorizing Provider  albuterol (VENTOLIN HFA) 108 (90 Base) MCG/ACT inhaler Inhale 2 puffs into the lungs every 6 (six) hours as needed for wheezing or shortness of breath. 11/02/22   Alexander-Savino, [name], MD  beclomethasone (QVAR REDIHALER) 80 MCG/ACT inhaler Inhale 1 puff into the lungs 2 (two) times daily. 12/08/22   Hassan Rowan, [name], MD  Darunavir-Cobicistat-Emtricitabine-Tenofovir Alafenamide [facility]) 800-150-200-10 MG TABS Take 1 tablet by mouth daily with breakfast. 11/02/22   Hassan Rowan, [name], MD  dolutegravir (TIVICAY) 50 MG tablet Take 1 tablet (50 mg total) by mouth daily. 04/16/22   Kuppelweiser, Cassie L, RPH-CPP  guaiFENesin-dextromethorphan (ROBITUSSIN DM) 100-10 MG/5ML syrup Take 5 mLs by mouth every 4 (four) hours as needed for cough. Patient not taking: Reported on 06/27/2022 03/17/22   Judyann Munson, MD  predniSONE (DELTASONE) 20 MG tablet Take 2 tablets (40 mg total) by mouth daily with  breakfast. 08/23/22   Atway, Rayann N, DO  rosuvastatin (CRESTOR) 20 MG tablet Take 1 tablet (20 mg total) by mouth daily. Patient not taking: Reported on 06/27/2022 06/23/22 06/23/23  Judyann Munson, MD  sulfamethoxazole-trimethoprim (BACTRIM DS) 800-160 MG tablet Take 1 tablet by mouth daily. Patient not taking: Reported on 08/24/2022 07/10/22   Judyann Munson, MD  umeclidinium-vilanterol [facility] ELLIPTA) 62.5-25 MCG/ACT AEPB Inhale 1 puff into the lungs daily. 09/08/22   Nooruddin, Jason Fila, MD  vitamin D3 (CHOLECALCIFEROL) 25 MCG tablet Take 1 tablet (1,000 Units total) by mouth daily. Patient not taking: Reported on 07/27/2022 11/19/21   Steffanie Rainwater, MD      Allergies    Metformin and related    Review of Systems   Review of Systems  Physical Exam Updated Vital Signs BP (!) 119/95   Pulse 96   Temp 97.8 F (36.6 C) (Oral)   Resp 14   Ht 5\' 7"  (1.702 m)   Wt 89 kg   SpO2 91%   BMI 30.73 kg/m  Physical Exam Vitals and nursing note reviewed.  HENT:     Head: Normocephalic and atraumatic.     Mouth/Throat:     Mouth: Mucous membranes are moist. No angioedema.     Pharynx: Uvula midline. No pharyngeal swelling, posterior oropharyngeal erythema or uvula swelling.  Cardiovascular:     Rate and Rhythm: Normal rate and regular rhythm.  Pulmonary:     Effort: Pulmonary effort is normal. No respiratory distress.     Breath sounds: Normal breath sounds.  Abdominal:     Comments: Colostomy bag in place with no sign of infection  Musculoskeletal:        General: No signs of injury.     Cervical back: Normal range of motion.  Skin:    General: Skin is dry.     Comments: Patient has noted dry skin noted all over his body, has slight papule like rash on the flexor aspect of his upper extremities, no lesions seen on patient's palms or soles of his feet, no evidence of infection   Neurological:     Mental Status: He is alert.  Psychiatric:        Speech: Speech normal.        Behavior:  Behavior normal.     ED Results / Procedures / Treatments   Labs (all labs ordered are listed, but only abnormal results are displayed) Labs Reviewed  BASIC METABOLIC PANEL - Abnormal; Notable for the following components:      Result Value   Glucose, Bld 156 (*)    Creatinine, Ser 1.56 (*)    Calcium 8.7 (*)    GFR, Estimated 53 (*)    All other components within normal limits  CBC WITH DIFFERENTIAL/PLATELET    EKG None  Radiology DG Chest 2 View  Result Date: 01/18/2023 CLINICAL DATA:  Shortness of breath. EXAM: CHEST - 2 VIEW COMPARISON:  CTA chest 08/19/2022 FINDINGS: The cardiac size is normal. The mediastinum normally outlined. There is no substantial pleural effusion. Asymmetric cylindrical/cystic bronchiectasis is again noted in the left lower lung field, with coarse linear scar-like markings both apices, left mid, left lower lung field. No new opacity suspicious for pneumonia is seen. Remaining lungs appear generally clear. No new osseous findings. IMPRESSION: 1. No evidence of acute chest disease. 2. Chronic changes and bronchiectasis with asymmetry. Electronically Signed   By: Almira Bar M.D.   On: 01/18/2023 06:20    Procedures Procedures    Medications Ordered in ED Medications  famotidine (PEPCID) IVPB 20 mg premix (0 mg Intravenous Stopped 01/18/23 0532)  dexamethasone (DECADRON) injection 10 mg (10 mg Intravenous Given 01/18/23 0537)    ED Course/ Medical Decision Making/ A&P                                 Medical Decision Making Amount and/or Complexity of Data Reviewed Labs: ordered. Radiology: ordered.  Risk Prescription drug management.   This patient presents to the ED for concern of rash, this involves an extensive number of treatment options, and is a complaint that carries with it a high risk of complications and morbidity.  The differential diagnosis includes dermatitis, Stevens-Johnson's, TENS, cellulitis, anaphylaxis   Co morbidities  that complicate the patient evaluation  HIV   Additional history obtained:  Additional history obtained from EMS External records from outside source obtained and reviewed including internal medicine notes   Lab Tests:  I Ordered, and personally interpreted labs.  The pertinent results include: Creatinine 1.56   Imaging Studies ordered:  I ordered imaging studies including chest x-ray I independently visualized and interpreted imaging which showed  1. No evidence of acute chest disease.  2. Chronic changes and bronchiectasis with asymmetry.   I agree with the radiologist interpretation   Cardiac Monitoring: / EKG:  The patient was maintained on a cardiac monitor.  I personally viewed and interpreted the cardiac monitored which showed an underlying rhythm of: Sinus rhythm   Problem List / ED Course / Critical interventions / Medication management   I ordered  medication including Decadron and famotidine for allergic reaction/pruritus Reevaluation of the patient after these medicines showed that the patient stayed the same I have reviewed the patients home medicines and have made adjustments as needed   Social Determinants of Health:  Social Determinants of Health with Concerns   Tobacco Use: Medium Risk (01/18/2023)   Patient History    Smoking Tobacco Use: Former    Smokeless Tobacco Use: Never    Passive Exposure: Not on Actuary Strain: Not on file  Physical Activity: Not on file  Stress: Not on file  Social Connections: Not on file  Alcohol Screen: Not on file  Housing: High Risk (08/19/2022)   Housing    Last Housing Risk Score: 2  Health Literacy: Not on file      Test / Admission - Considered:  Patient care being transferred to Melina Modena, PA-C at shift handoff.  No signs of Stevens-Johnson's, TENS at this time.  Patient does not appear to have cellulitis.  Patient with dry skin all over his body.  Chart review shows history of the  same.  Patient reportedly has history of both affording medications and adherence due to language barrier.  Patient will need monitoring for some more time due to receiving epinephrine from EMS.  At this time feel patient would likely be able to discharge home with follow-up with internal medicine/infectious disease.         Final Clinical Impression(s) / ED Diagnoses Final diagnoses:  Pruritus    Rx / DC Orders ED Discharge Orders     None         Pamala Duffel 01/18/23 0454    Tilden Fossa, MD 01/18/23 [phone]

## 2023-01-20 ENCOUNTER — Other Ambulatory Visit: Payer: Self-pay

## 2023-01-20 ENCOUNTER — Ambulatory Visit: Payer: Managed Care, Other (non HMO) | Admitting: Internal Medicine

## 2023-01-20 ENCOUNTER — Other Ambulatory Visit (HOSPITAL_COMMUNITY): Payer: Self-pay

## 2023-01-20 ENCOUNTER — Telehealth: Payer: Self-pay

## 2023-01-20 NOTE — Progress Notes (Signed)
Specialty Pharmacy Refill Coordination Note  [name] is a 52 y.o. male assessed today regarding refills of clinic administered specialty medication(s) Darun-Cobic-Emtricit-Tenofaf; Dolutegravir Sodium   Clinic requested Courier to Provider Office   Delivery date: 01/21/23   Verified address: [facility] [address] [place] [address]   Medication will be filled on 01/20/23.

## 2023-01-20 NOTE — Telephone Encounter (Signed)
Interp unavailable-Attempted to contact patient incase someone was able to translate but no answer. No Show 01/20/2023.

## 2023-01-21 ENCOUNTER — Telehealth: Payer: Self-pay

## 2023-01-21 NOTE — Telephone Encounter (Signed)
[facility] Patient Advocate Encounter  Patient's medications TIVICAY,SYMTUZA,BACTRIM have been couriered to [facility] from [facility].  Kae Heller, CPhT Specialty Pharmacy Patient [facility] for Infectious Disease Phone: [phone] Fax:  [phone]

## 2023-01-22 ENCOUNTER — Other Ambulatory Visit (HOSPITAL_COMMUNITY): Payer: Self-pay

## 2023-02-02 ENCOUNTER — Telehealth: Payer: Self-pay

## 2023-02-02 ENCOUNTER — Other Ambulatory Visit: Payer: Self-pay | Admitting: Student

## 2023-02-02 ENCOUNTER — Other Ambulatory Visit (HOSPITAL_COMMUNITY): Payer: Self-pay

## 2023-02-02 DIAGNOSIS — J449 Chronic obstructive pulmonary disease, unspecified: Secondary | ICD-10-CM

## 2023-02-02 NOTE — Telephone Encounter (Signed)
Medication was picked up at [facility] on  02/02/23 @ 11/:30AM

## 2023-02-02 NOTE — Telephone Encounter (Signed)
Pt's requesting refill on Qvar and Albuterol. Also his next appt has been schedule for 03/22/23.

## 2023-02-03 ENCOUNTER — Other Ambulatory Visit (HOSPITAL_COMMUNITY): Payer: Self-pay

## 2023-02-04 ENCOUNTER — Other Ambulatory Visit: Payer: Self-pay

## 2023-02-04 ENCOUNTER — Other Ambulatory Visit (HOSPITAL_COMMUNITY): Payer: Self-pay

## 2023-02-04 NOTE — Progress Notes (Signed)
Specialty Pharmacy Refill Coordination Note  [name] is a 52 y.o. male assessed today regarding refills of clinic administered specialty medication(s) Darun-Cobic-Emtricit-Tenofaf; Dolutegravir Sodium   Clinic requested Courier to Provider Office   Delivery date: 02/16/23   Verified address: [facility] [address] [place] [address]   Medication will be filled on 02/15/23.

## 2023-02-09 ENCOUNTER — Other Ambulatory Visit (HOSPITAL_COMMUNITY): Payer: Self-pay

## 2023-02-15 ENCOUNTER — Other Ambulatory Visit: Payer: Self-pay

## 2023-02-15 ENCOUNTER — Other Ambulatory Visit (HOSPITAL_COMMUNITY): Payer: Self-pay

## 2023-02-15 ENCOUNTER — Encounter: Payer: Self-pay | Admitting: Internal Medicine

## 2023-02-15 ENCOUNTER — Ambulatory Visit (INDEPENDENT_AMBULATORY_CARE_PROVIDER_SITE_OTHER): Payer: Commercial Managed Care - HMO | Admitting: Internal Medicine

## 2023-02-15 VITALS — BP 137/79 | HR 89 | Temp 98.5°F | Wt 195.0 lb

## 2023-02-15 DIAGNOSIS — J449 Chronic obstructive pulmonary disease, unspecified: Secondary | ICD-10-CM | POA: Diagnosis not present

## 2023-02-15 DIAGNOSIS — B20 Human immunodeficiency virus [HIV] disease: Secondary | ICD-10-CM

## 2023-02-15 DIAGNOSIS — Z23 Encounter for immunization: Secondary | ICD-10-CM

## 2023-02-15 MED ORDER — TIVICAY 50 MG PO TABS: 50.0000 mg | ORAL_TABLET | Freq: Every day | ORAL | 11 refills | Status: DC

## 2023-02-15 MED ORDER — TRIAMCINOLONE ACETONIDE 0.1 % EX OINT: 1.0000 | TOPICAL_OINTMENT | Freq: Two times a day (BID) | CUTANEOUS | 0 refills | Status: DC

## 2023-02-15 MED ORDER — ALBUTEROL SULFATE HFA 108 (90 BASE) MCG/ACT IN AERS: 2.0000 | INHALATION_SPRAY | Freq: Four times a day (QID) | RESPIRATORY_TRACT | 2 refills | Status: DC | PRN

## 2023-02-15 MED ORDER — SULFAMETHOXAZOLE-TRIMETHOPRIM 800-160 MG PO TABS: 1.0000 | ORAL_TABLET | Freq: Every day | ORAL | 11 refills | Status: DC

## 2023-02-15 MED ORDER — SYMTUZA 800-150-200-10 MG PO TABS: 1.0000 | ORAL_TABLET | Freq: Every day | ORAL | 11 refills | Status: DC

## 2023-02-15 MED ORDER — QVAR REDIHALER 80 MCG/ACT IN AERB: 1.0000 | INHALATION_SPRAY | Freq: Two times a day (BID) | RESPIRATORY_TRACT | 2 refills | Status: DC

## 2023-02-15 NOTE — Progress Notes (Unsigned)
Patient ID: [name], male   DOB: [birth year], 52 y.o.   MRN: [id number]  HPI [name] is here with swahili translator. JP is a 52yo M with advanced hiv disease currently on tivicay-symtuza plus bactrim for oi proph for which he takes daily and is able to recite his schedule. He was seen in the ED early November for diffuse hives/allergy quite pruritic. Source is unclear as he denies new clothing, new detergent, or new bedding/used furniture that could be source of exposure. He is now feeling better  On exam of his arms, he still has some areas on the forearm that are raised. No erythema no drainage.  He reports using his inhalers but does need refills  Outpatient Encounter Medications as of 02/15/2023  Medication Sig   albuterol (VENTOLIN HFA) 108 (90 Base) MCG/ACT inhaler Inhale 2 puffs into the lungs every 6 (six) hours as needed for wheezing or shortness of breath.   beclomethasone (QVAR REDIHALER) 80 MCG/ACT inhaler Inhale 1 puff into the lungs 2 (two) times daily.   Darunavir-Cobicistat-Emtricitabine-Tenofovir Alafenamide (SYMTUZA) 800-150-200-10 MG TABS Take 1 tablet by mouth daily with breakfast.   dolutegravir (TIVICAY) 50 MG tablet Take 1 tablet (50 mg total) by mouth daily.   sulfamethoxazole-trimethoprim (BACTRIM DS) 800-160 MG tablet Take 1 tablet by mouth daily.   triamcinolone ointment (KENALOG) 0.1 % Apply 1 Application topically 2 (two) times daily.   umeclidinium-vilanterol (ANORO ELLIPTA) 62.5-25 MCG/ACT AEPB Inhale 1 puff into the lungs daily.   guaiFENesin-dextromethorphan (ROBITUSSIN DM) 100-10 MG/5ML syrup Take 5 mLs by mouth every 4 (four) hours as needed for cough. (Patient not taking: Reported on 06/27/2022)   predniSONE (DELTASONE) 20 MG tablet Take 2 tablets (40 mg total) by mouth daily with breakfast. (Patient not taking: Reported on 02/15/2023)   rosuvastatin (CRESTOR) 20 MG tablet Take 1 tablet (20 mg total) by mouth daily. (Patient not taking: Reported  on 06/27/2022)   vitamin D3 (CHOLECALCIFEROL) 25 MCG tablet Take 1 tablet (1,000 Units total) by mouth daily. (Patient not taking: Reported on 07/27/2022)   No facility-administered encounter medications on file as of 02/15/2023.     Patient Active Problem List   Diagnosis Date Noted   Acute on chronic hypoxic respiratory failure ([name]) 08/19/2022   CAP (community acquired pneumonia) 06/27/2022   Adenopathy 12/07/2021   Acute on chronic combined systolic and diastolic HF (heart failure) ([name]) 12/07/2021   Bacteremia 11/17/2021   Listeriosis    Sepsis ([name]) 11/15/2021   Atopic dermatitis 09/30/2021   Allergy 09/30/2021   Steroid-induced hyperglycemia 09/05/2021   COPD exacerbation ([name]) 08/23/2021   Bronchiectasis without acute exacerbation ([name]) 04/22/2021   Pruritic dermatitis 04/03/2021   S/P colostomy ([name]) 02/06/2021   History of tuberculosis 11/01/2020   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   CKD (chronic kidney disease) stage 2, GFR 60-89 ml/min 11/01/2020   Type 2 diabetes mellitus ([name]) 05/17/2019   Genital warts 05/06/2018   Pulmonary emphysema ([name])    Condyloma 01/04/2017   Chronic systolic CHF (congestive heart failure) ([name]) 04/24/2016   AKI (acute kidney injury) ([name]) 04/24/2016   Normochromic normocytic anemia 02/25/2016   Pneumonia 02/25/2016   AIDS (acquired immune deficiency syndrome) ([name])    S/P ORIF (open reduction internal fixation) fracture 03/21/2014   HIV disease ([name]) 09/18/2013     Health Maintenance Due  Topic Date Due   FOOT EXAM  Never done   OPHTHALMOLOGY EXAM  Never done   Zoster Vaccines- Shingrix (1 of 2) Never  done   COVID-19 Vaccine (2 - Pfizer risk series) 01/26/2020   INFLUENZA VACCINE  10/15/2022   HEMOGLOBIN A1C  12/28/2022     Review of Systems 12 point ros is negative Physical Exam   BP 137/79   Pulse 89   Temp 98.5 F (36.9 C) (Temporal)   Wt 195 lb (88.5 kg)   SpO2 92%   BMI 30.54 kg/m   Physical  Exam  Constitutional: He is oriented to person, place, and time. He appears well-developed and well-nourished. No distress.  HENT:  Mouth/Throat: Oropharynx is clear and moist. No oropharyngeal exudate.  Cardiovascular: Normal rate, regular rhythm and normal heart sounds. Exam reveals no gallop and no friction rub.  No murmur heard.  Pulmonary/Chest: Effort normal and breath sounds normal. No respiratory distress. He has no wheezes.  Abdominal: Soft. Bowel sounds are normal. He exhibits no distension. There is no tenderness.  Lymphadenopathy:  He has no cervical adenopathy.  Neurological: He is alert and oriented to person, place, and time.  Skin: Skin is warm and dry. No rash noted. No erythema.  Psychiatric: He has a normal mood and affect. His behavior is normal.   Lab Results  Component Value Date   CD4TCELL 11 (L) 06/23/2022   Lab Results  Component Value Date   CD4TABS 93 (L) 06/23/2022   CD4TABS 75 (L) 03/17/2022   CD4TABS 60 (L) 12/01/2021   Lab Results  Component Value Date   HIV1RNAQUANT 104 (H) 06/23/2022   No results found for: "HEPBSAB" Lab Results  Component Value Date   LABRPR NON-REACTIVE 06/23/2022    CBC Lab Results  Component Value Date   WBC 5.0 01/18/2023   RBC 4.56 01/18/2023   HGB 14.7 01/18/2023   HCT 45.5 01/18/2023   PLT 171 01/18/2023   MCV 99.8 01/18/2023   MCH 32.2 01/18/2023   MCHC 32.3 01/18/2023   RDW 11.9 01/18/2023   LYMPHSABS 1.9 01/18/2023   MONOABS 0.5 01/18/2023   EOSABS 0.5 01/18/2023    BMET Lab Results  Component Value Date   NA 140 01/18/2023   K 4.3 01/18/2023   CL 102 01/18/2023   CO2 29 01/18/2023   GLUCOSE 156 (H) 01/18/2023   BUN 19 01/18/2023   CREATININE 1.56 (H) 01/18/2023   CALCIUM 8.7 (L) 01/18/2023   GFRNONAA 53 (L) 01/18/2023   GFRAA 79 12/04/2019      Assessment and Plan  Hiv disease =will need labs to see that he is virologically controlled, refills; anticipate that still needs to continue  with bactrim for oi proph  Long term medication management = will check cr  Refills on inhalers  Steroid cream for allergic reaction Flu and covid vaccine today

## 2023-02-16 ENCOUNTER — Telehealth: Payer: Self-pay

## 2023-02-16 LAB — CBC WITH DIFFERENTIAL/PLATELET
Absolute Lymphocytes: 957 {cells}/uL (ref 850–3900)
Absolute Monocytes: 399 {cells}/uL (ref 200–950)
Basophils Absolute: 30 {cells}/uL (ref 0–200)
Basophils Relative: 0.9 %
Eosinophils Absolute: 208 {cells}/uL (ref 15–500)
Eosinophils Relative: 6.3 %
HCT: 45.2 % (ref 38.5–50.0)
Hemoglobin: 15.3 g/dL (ref 13.2–17.1)
MCH: 32.2 pg (ref 27.0–33.0)
MCHC: 33.8 g/dL (ref 32.0–36.0)
MCV: 95.2 fL (ref 80.0–100.0)
MPV: 10.3 fL (ref 7.5–12.5)
Monocytes Relative: 12.1 %
Neutro Abs: 1706 {cells}/uL (ref 1500–7800)
Neutrophils Relative %: 51.7 %
Platelets: 196 10*3/uL (ref 140–400)
RBC: 4.75 10*6/uL (ref 4.20–5.80)
RDW: 11.8 % (ref 11.0–15.0)
Total Lymphocyte: 29 %
WBC: 3.3 10*3/uL — ABNORMAL LOW (ref 3.8–10.8)

## 2023-02-16 LAB — COMPLETE METABOLIC PANEL WITH GFR
AG Ratio: 1.2 (calc) (ref 1.0–2.5)
ALT: 19 U/L (ref 9–46)
AST: 25 U/L (ref 10–35)
Albumin: 4.1 g/dL (ref 3.6–5.1)
Alkaline phosphatase (APISO): 78 U/L (ref 35–144)
BUN: 22 mg/dL (ref 7–25)
CO2: 29 mmol/L (ref 20–32)
Calcium: 9 mg/dL (ref 8.6–10.3)
Chloride: 100 mmol/L (ref 98–110)
Creat: 1.26 mg/dL (ref 0.70–1.30)
Globulin: 3.3 g/dL (ref 1.9–3.7)
Glucose, Bld: 102 mg/dL — ABNORMAL HIGH (ref 65–99)
Potassium: 4 mmol/L (ref 3.5–5.3)
Sodium: 136 mmol/L (ref 135–146)
Total Bilirubin: 0.5 mg/dL (ref 0.2–1.2)
Total Protein: 7.4 g/dL (ref 6.1–8.1)
eGFR: 69 mL/min/{1.73_m2} (ref >=60)

## 2023-02-16 LAB — HIV-1 RNA QUANT-NO REFLEX-BLD
HIV 1 RNA Quant: <20 {copies}/mL — ABNORMAL HIGH
HIV-1 RNA Quant, Log: <1.3 {Log_copies}/mL — ABNORMAL HIGH

## 2023-02-16 LAB — RPR: RPR Ser Ql: NONREACTIVE

## 2023-02-16 NOTE — Telephone Encounter (Signed)
[facility] Patient Advocate Encounter  Patient's medications TIVICAY, SYMTUZA, BACTRIM  have been couriered to [facility] from [facility] pharmacy and will be picked up at [facility].  Kae Heller, CPhT Specialty Pharmacy Patient [facility] for Infectious Disease Phone: [phone] Fax:  [phone]

## 2023-02-17 LAB — T-HELPER CELL (CD4) - (RCID CLINIC ONLY)
CD4 % Helper T Cell: 10 % — ABNORMAL LOW (ref 33–65)
CD4 T Cell Abs: 87 /uL — ABNORMAL LOW (ref 400–1790)

## 2023-03-22 ENCOUNTER — Ambulatory Visit (INDEPENDENT_AMBULATORY_CARE_PROVIDER_SITE_OTHER): Payer: 59 | Admitting: Internal Medicine

## 2023-03-22 ENCOUNTER — Other Ambulatory Visit: Payer: Self-pay

## 2023-03-22 ENCOUNTER — Encounter: Payer: Self-pay | Admitting: Internal Medicine

## 2023-03-22 VITALS — BP 135/85 | HR 91 | Temp 97.4°F | Ht 73.0 in | Wt 187.8 lb

## 2023-03-22 DIAGNOSIS — B20 Human immunodeficiency virus [HIV] disease: Secondary | ICD-10-CM

## 2023-03-22 DIAGNOSIS — N179 Acute kidney failure, unspecified: Secondary | ICD-10-CM | POA: Diagnosis not present

## 2023-03-22 DIAGNOSIS — I10 Essential (primary) hypertension: Secondary | ICD-10-CM

## 2023-03-22 DIAGNOSIS — J449 Chronic obstructive pulmonary disease, unspecified: Secondary | ICD-10-CM

## 2023-03-22 DIAGNOSIS — I152 Hypertension secondary to endocrine disorders: Secondary | ICD-10-CM

## 2023-03-22 DIAGNOSIS — J439 Emphysema, unspecified: Secondary | ICD-10-CM

## 2023-03-22 DIAGNOSIS — Z0000 Encounter for general adult medical examination without abnormal findings: Secondary | ICD-10-CM | POA: Insufficient documentation

## 2023-03-22 DIAGNOSIS — E785 Hyperlipidemia, unspecified: Secondary | ICD-10-CM

## 2023-03-22 DIAGNOSIS — E1159 Type 2 diabetes mellitus with other circulatory complications: Secondary | ICD-10-CM | POA: Insufficient documentation

## 2023-03-22 DIAGNOSIS — I5022 Chronic systolic (congestive) heart failure: Secondary | ICD-10-CM

## 2023-03-22 DIAGNOSIS — N1831 Chronic kidney disease, stage 3a: Secondary | ICD-10-CM

## 2023-03-22 DIAGNOSIS — E1122 Type 2 diabetes mellitus with diabetic chronic kidney disease: Secondary | ICD-10-CM

## 2023-03-22 LAB — POCT GLYCOSYLATED HEMOGLOBIN (HGB A1C): Hemoglobin A1C: 6.1 % — AB (ref 4.0–5.6)

## 2023-03-22 LAB — GLUCOSE, CAPILLARY: Glucose-Capillary: 130 mg/dL — ABNORMAL HIGH (ref 70–99)

## 2023-03-22 MED ORDER — ANORO ELLIPTA 62.5-25 MCG/ACT IN AEPB: 1.0000 | INHALATION_SPRAY | Freq: Every day | RESPIRATORY_TRACT | 5 refills | Status: DC

## 2023-03-22 MED ORDER — ALBUTEROL SULFATE HFA 108 (90 BASE) MCG/ACT IN AERS: 2.0000 | INHALATION_SPRAY | Freq: Four times a day (QID) | RESPIRATORY_TRACT | 2 refills | Status: DC | PRN

## 2023-03-22 MED ORDER — ROSUVASTATIN CALCIUM 20 MG PO TABS: 20.0000 mg | ORAL_TABLET | Freq: Every day | ORAL | 11 refills | Status: DC

## 2023-03-22 MED ORDER — QVAR REDIHALER 80 MCG/ACT IN AERB: 1.0000 | INHALATION_SPRAY | Freq: Two times a day (BID) | RESPIRATORY_TRACT | 11 refills | Status: DC

## 2023-03-22 MED ORDER — LOSARTAN POTASSIUM 25 MG PO TABS: 25.0000 mg | ORAL_TABLET | Freq: Every day | ORAL | 11 refills | Status: DC

## 2023-03-22 NOTE — Assessment & Plan Note (Signed)
 Slight coughing that is non-productive. States has been out of his inhalers for 2 days. Will refill his inhalers. No alarming features to suggest infection. Will improve with resumption of his inhalers.

## 2023-03-22 NOTE — Assessment & Plan Note (Signed)
 Pt's BP is 135/85 which is near goal. Will start him on losartan 25 mg which will help with his CHF and prevent progression of nephropathy. Repeat BMP in one month.

## 2023-03-22 NOTE — Progress Notes (Signed)
 CC: Follow up  HPI:  [name] is a 53 y.o. with medical history of HIV, HLD, DMII, COPD presenting to [facility] for a follow up.  Please see problem-based list for further details, assessments, and plans.  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   rosuvastatin  (CRESTOR ) 20 MG tablet, Take 1 tablet (20 mg total) by mouth daily.  Current Outpatient Medications (Respiratory):    albuterol  (VENTOLIN  HFA) 108 (90 Base) MCG/ACT inhaler, Inhale 2 puffs into the lungs every 6 (six) hours as needed for wheezing or shortness of breath.   beclomethasone (QVAR  REDIHALER) 80 MCG/ACT inhaler, Inhale 1 puff into the lungs 2 (two) times daily.   umeclidinium-vilanterol (ANORO ELLIPTA ) 62.5-25 MCG/ACT AEPB, Inhale 1 puff into the lungs daily.    Current Outpatient Medications (Other):    Darunavir -Cobicistat -Emtricitabine -Tenofovir   Alafenamide (SYMTUZA ) 800-150-200-10 MG TABS, Take 1 tablet by mouth daily with breakfast.   dolutegravir  (TIVICAY ) 50 MG tablet, Take 1 tablet (50 mg total) by mouth daily.   sulfamethoxazole -trimethoprim  (BACTRIM  DS) 800-160 MG tablet, Take 1 tablet by mouth daily.   triamcinolone  ointment (KENALOG ) 0.1 %, Apply 1 Application topically 2 (two) times daily.  Review of Systems:  Review of system negative unless stated in the problem list or HPI.    Physical Exam:  Vitals:   03/22/23 1025 03/22/23 1030  BP: (!) 124/100 135/85  Pulse: 89 91  Temp: (!) 97.4 F (36.3 C)   TempSrc: Oral   SpO2: 95%   Weight: 187 lb 12.8 oz (85.2 kg)   Height: 6' 1 (1.854 m)    Physical Exam General: NAD HENT: NCAT Lungs: CTAB, no wheeze, rhonchi or rales.  Cardiovascular: Normal heart sounds, no r/m/g, 2+ pulses in all extremities. No LE edema Abdomen: No TTP, normal bowel sounds MSK: No asymmetry or muscle atrophy.  Skin: no lesions noted on exposed skin Neuro: Alert and oriented x4. CN grossly intact Psych: Normal mood and normal affect   Assessment & Plan:   Type 2 diabetes mellitus ([name]) A1c 6.1 this visit. Not on any medications. At follow up visit, can add SGLT2 inhibitor as given his CHF.   COPD (chronic obstructive pulmonary disease) with emphysema ([name]) Slight coughing that is non-productive. States has been out of his inhalers for 2 days. Will refill his inhalers. No alarming features to suggest infection. Will improve with resumption of his inhalers.  HIV disease ([name]) Pt adherent to his ART. Viral load checked 02/2023 was <20 and CD4 count was 87. Pt taking bactrim  for prophylaxis. Has regular follow up with ID. Encouraged him continue taking his ART and follow up with ID regularly.   AKI (acute kidney injury) ([name]) Resolved on repeat lab work.   Hypertension associated with diabetes ([name]) Pt's BP is 135/85 which is near goal. Will start him on losartan  25 mg which will help  with his CHF and prevent progression of nephropathy. Repeat BMP in one month.   Chronic systolic CHF (congestive heart failure) ([name]) EF has normalized. Euvolemic on exam today. Will start patient on ARB this visit and SGLT2 next visit.   Health care maintenance Foot exam done this visit.  Eye referral placed this visit.  A1c checked this visit.    See Encounters Tab for problem based charting.  Patient Discussed with Dr. Forest Morene Bathe, MD [place] DEL. [facility] Internal Medicine Residency, PGY-3

## 2023-03-22 NOTE — Patient Instructions (Addendum)
 [name], it was a pleasure seeing you today! You endorsed feeling well today. Below are some of the things we talked about this visit. We look forward to seeing you in the follow up appointment!  Today we discussed: I am going to refill your medications. I would like to add losartan  25 mg daily to help with protect your heart and kidneys.   I am referring you to an eye doctor.   Will see you back in one month.   I have ordered the following labs today:   Lab Orders         POC Hbg A1C       Referrals ordered today:    Referral Orders         Ambulatory referral to Ophthalmology      I have ordered the following medication/changed the following medications:   Stop the following medications: Medications Discontinued During This Encounter  Medication Reason   guaiFENesin -dextromethorphan  (ROBITUSSIN DM) 100-10 MG/5ML syrup Patient has not taken in last 30 days   predniSONE  (DELTASONE ) 20 MG tablet Patient has not taken in last 30 days   vitamin D3 (CHOLECALCIFEROL ) 25 MCG tablet Patient has not taken in last 30 days   rosuvastatin  (CRESTOR ) 20 MG tablet Reorder   umeclidinium-vilanterol (ANORO ELLIPTA ) 62.5-25 MCG/ACT AEPB Reorder   beclomethasone (QVAR  REDIHALER) 80 MCG/ACT inhaler Reorder   albuterol  (VENTOLIN  HFA) 108 (90 Base) MCG/ACT inhaler Reorder     Start the following medications: Meds ordered this encounter  Medications   beclomethasone (QVAR  REDIHALER) 80 MCG/ACT inhaler    Sig: Inhale 1 puff into the lungs 2 (two) times daily.    Dispense:  10.6 g    Refill:  11   albuterol  (VENTOLIN  HFA) 108 (90 Base) MCG/ACT inhaler    Sig: Inhale 2 puffs into the lungs every 6 (six) hours as needed for wheezing or shortness of breath.    Dispense:  6.7 g    Refill:  2   rosuvastatin  (CRESTOR ) 20 MG tablet    Sig: Take 1 tablet (20 mg total) by mouth daily.    Dispense:  30 tablet    Refill:  11   umeclidinium-vilanterol (ANORO ELLIPTA ) 62.5-25 MCG/ACT AEPB     Sig: Inhale 1 puff into the lungs daily.    Dispense:  60 each    Refill:  5   losartan  (COZAAR ) 25 MG tablet    Sig: Take 1 tablet (25 mg total) by mouth daily.    Dispense:  30 tablet    Refill:  11     Follow-up: 1 month follow up for repeat blood work   Please make sure to arrive 15 minutes prior to your next appointment. If you arrive late, you may be asked to reschedule.   We look forward to seeing you next time. Please call our clinic at [phone] if you have any questions or concerns. The best time to call is Monday-Friday from 9am-4pm, but there is someone available 24/7. If after hours or the weekend, call the main hospital number and ask for the Internal Medicine Resident On-Call. If you need medication refills, please notify your pharmacy one week in advance and they will send us  a request.  Thank you for letting us  take part in your care. Wishing you the best!  Thank you, Morene Bathe, MD

## 2023-03-22 NOTE — Assessment & Plan Note (Signed)
 EF has normalized. Euvolemic on exam today. Will start patient on ARB this visit and SGLT2 next visit.

## 2023-03-22 NOTE — Assessment & Plan Note (Signed)
 Foot exam done this visit.  Eye referral placed this visit.  A1c checked this visit.

## 2023-03-22 NOTE — Assessment & Plan Note (Signed)
 A1c 6.1 this visit. Not on any medications. At follow up visit, can add SGLT2 inhibitor as given his CHF.

## 2023-03-22 NOTE — Assessment & Plan Note (Signed)
 Pt adherent to his ART. Viral load checked 02/2023 was <20 and CD4 count was 87. Pt taking bactrim for prophylaxis. Has regular follow up with ID. Encouraged him continue taking his ART and follow up with ID regularly.

## 2023-03-22 NOTE — Assessment & Plan Note (Signed)
Resolved on repeat lab work.

## 2023-03-23 NOTE — Progress Notes (Signed)
 Internal Medicine Clinic Attending  Case discussed with the resident at the time of the visit.  We reviewed the resident's history and exam and pertinent patient test results.  I agree with the assessment, diagnosis, and plan of care documented in the resident's note.

## 2023-04-01 ENCOUNTER — Other Ambulatory Visit: Payer: Self-pay | Admitting: Pharmacist

## 2023-04-01 ENCOUNTER — Other Ambulatory Visit: Payer: Self-pay

## 2023-04-01 ENCOUNTER — Other Ambulatory Visit (HOSPITAL_COMMUNITY): Payer: Self-pay

## 2023-04-01 DIAGNOSIS — B20 Human immunodeficiency virus [HIV] disease: Secondary | ICD-10-CM

## 2023-04-01 MED ORDER — TIVICAY 50 MG PO TABS
50.0000 mg | ORAL_TABLET | Freq: Every day | ORAL | 11 refills | Status: DC
  Filled 2023-04-01: qty 30, 30d supply, fill #0
  Filled 2023-05-13: qty 30, 30d supply, fill #1

## 2023-04-01 MED ORDER — SYMTUZA 800-150-200-10 MG PO TABS
1.0000 | ORAL_TABLET | Freq: Every day | ORAL | 11 refills | Status: DC
  Filled 2023-04-01: qty 30, 30d supply, fill #0
  Filled 2023-05-13: qty 30, 30d supply, fill #1

## 2023-04-01 NOTE — Progress Notes (Signed)
Specialty Pharmacy Ongoing Clinical Assessment Note  [name] is a 53 y.o. male who is being followed by the specialty pharmacy service for RxSp HIV   Patient's specialty medication(s) reviewed today: Darun-Cobic-Emtricit-TenofAF (Symtuza); Dolutegravir Sodium (Tivicay)   Missed doses in the last 4 weeks: 1-2   Patient/Caregiver did not have any additional questions or concerns.   Therapeutic benefit summary: Patient is achieving benefit   Adverse events/side effects summary: No adverse events/side effects   Patient's therapy is appropriate to: Continue    Goals Addressed             This Visit's Progress    Increase CD4 count until steady state       Patient is not on track and improving. Patient will work on increased adherence         Follow up:  3 months  Jennette Kettle Specialty Pharmacist

## 2023-04-01 NOTE — Progress Notes (Signed)
Specialty Pharmacy Refill Coordination Note  [name] is a 53 y.o. male assessed today regarding refills of clinic administered specialty medication(s) Darun-Cobic-Emtricit-TenofAF (Symtuza); Dolutegravir Sodium (Tivicay)   Clinic requested Courier to Provider Office   Delivery date: 04/05/23   Verified address: [address] [place] [address]   Medication will be filled on 04/02/23.

## 2023-04-01 NOTE — Progress Notes (Signed)
Resending to [facility] as Lupita Leash helps coordinate delivery.  Margarite Gouge, PharmD, CPP, BCIDP, AAHIVP Clinical Pharmacist Practitioner Infectious Diseases Clinical Pharmacist [facility] for Infectious Disease

## 2023-04-02 ENCOUNTER — Other Ambulatory Visit: Payer: Self-pay

## 2023-04-05 ENCOUNTER — Telehealth: Payer: Self-pay

## 2023-04-05 NOTE — Telephone Encounter (Signed)
[facility] Patient Advocate Encounter  Patient's medications Symtuza & Tivicay have been couriered to [facility] from [facility] and will be picked up.  Clearance Coots, CPhT Specialty Pharmacy Patient [facility] for Infectious Disease Phone: [phone] Fax:  [phone]

## 2023-04-07 ENCOUNTER — Other Ambulatory Visit: Payer: Self-pay

## 2023-04-07 ENCOUNTER — Emergency Department (HOSPITAL_COMMUNITY): Payer: 59

## 2023-04-07 ENCOUNTER — Inpatient Hospital Stay (HOSPITAL_COMMUNITY)
Admission: EM | Admit: 2023-04-07 | Discharge: 2023-04-09 | DRG: 974 | Disposition: A | Discharge status: Home / Self Care | Payer: 59 | Attending: Internal Medicine | Admitting: Internal Medicine

## 2023-04-07 DIAGNOSIS — Z79899 Other long term (current) drug therapy: Secondary | ICD-10-CM

## 2023-04-07 DIAGNOSIS — E875 Hyperkalemia: Secondary | ICD-10-CM | POA: Diagnosis present

## 2023-04-07 DIAGNOSIS — I5042 Chronic combined systolic (congestive) and diastolic (congestive) heart failure: Secondary | ICD-10-CM | POA: Diagnosis present

## 2023-04-07 DIAGNOSIS — J189 Pneumonia, unspecified organism: Secondary | ICD-10-CM | POA: Diagnosis not present

## 2023-04-07 DIAGNOSIS — Z7951 Long term (current) use of inhaled steroids: Secondary | ICD-10-CM

## 2023-04-07 DIAGNOSIS — J440 Chronic obstructive pulmonary disease with acute lower respiratory infection: Secondary | ICD-10-CM | POA: Diagnosis present

## 2023-04-07 DIAGNOSIS — Z8249 Family history of ischemic heart disease and other diseases of the circulatory system: Secondary | ICD-10-CM

## 2023-04-07 DIAGNOSIS — T380X5A Adverse effect of glucocorticoids and synthetic analogues, initial encounter: Secondary | ICD-10-CM | POA: Diagnosis present

## 2023-04-07 DIAGNOSIS — Z1152 Encounter for screening for COVID-19: Secondary | ICD-10-CM

## 2023-04-07 DIAGNOSIS — Z86711 Personal history of pulmonary embolism: Secondary | ICD-10-CM

## 2023-04-07 DIAGNOSIS — Z8611 Personal history of tuberculosis: Secondary | ICD-10-CM

## 2023-04-07 DIAGNOSIS — Z825 Family history of asthma and other chronic lower respiratory diseases: Secondary | ICD-10-CM

## 2023-04-07 DIAGNOSIS — E785 Hyperlipidemia, unspecified: Secondary | ICD-10-CM | POA: Diagnosis present

## 2023-04-07 DIAGNOSIS — J441 Chronic obstructive pulmonary disease with (acute) exacerbation: Secondary | ICD-10-CM | POA: Diagnosis present

## 2023-04-07 DIAGNOSIS — J449 Chronic obstructive pulmonary disease, unspecified: Secondary | ICD-10-CM | POA: Insufficient documentation

## 2023-04-07 DIAGNOSIS — I110 Hypertensive heart disease with heart failure: Secondary | ICD-10-CM | POA: Diagnosis present

## 2023-04-07 DIAGNOSIS — E1165 Type 2 diabetes mellitus with hyperglycemia: Secondary | ICD-10-CM | POA: Diagnosis present

## 2023-04-07 DIAGNOSIS — J9601 Acute respiratory failure with hypoxia: Principal | ICD-10-CM | POA: Diagnosis present

## 2023-04-07 DIAGNOSIS — B20 Human immunodeficiency virus [HIV] disease: Secondary | ICD-10-CM | POA: Diagnosis present

## 2023-04-07 LAB — COMPREHENSIVE METABOLIC PANEL
ALT: 16 U/L (ref 0–44)
AST: 14 U/L — ABNORMAL LOW (ref 15–41)
Albumin: 2.7 g/dL — ABNORMAL LOW (ref 3.5–5.0)
Alkaline Phosphatase: 73 U/L (ref 38–126)
Anion gap: 15 (ref 5–15)
BUN: 27 mg/dL — ABNORMAL HIGH (ref 6–20)
CO2: 21 mmol/L — ABNORMAL LOW (ref 22–32)
Calcium: 9.4 mg/dL (ref 8.9–10.3)
Chloride: 96 mmol/L — ABNORMAL LOW (ref 98–111)
Creatinine, Ser: 1.53 mg/dL — ABNORMAL HIGH (ref 0.61–1.24)
GFR, Estimated: 54 mL/min — ABNORMAL LOW (ref >60)
Glucose, Bld: 269 mg/dL — ABNORMAL HIGH (ref 70–99)
Potassium: 4.1 mmol/L (ref 3.5–5.1)
Sodium: 132 mmol/L — ABNORMAL LOW (ref 135–145)
Total Bilirubin: 0.5 mg/dL (ref 0.0–1.2)
Total Protein: 7.8 g/dL (ref 6.5–8.1)

## 2023-04-07 LAB — TROPONIN I (HIGH SENSITIVITY)
Troponin I (High Sensitivity): 19 ng/L — ABNORMAL HIGH (ref <18)
Troponin I (High Sensitivity): 16 ng/L (ref <18)

## 2023-04-07 LAB — CBC WITH DIFFERENTIAL/PLATELET
Abs Immature Granulocytes: 0.16 10*3/uL — ABNORMAL HIGH (ref 0.00–0.07)
Basophils Absolute: 0 10*3/uL (ref 0.0–0.1)
Basophils Relative: 0 %
Eosinophils Absolute: 0 10*3/uL (ref 0.0–0.5)
Eosinophils Relative: 0 %
HCT: 41.1 % (ref 39.0–52.0)
Hemoglobin: 13.8 g/dL (ref 13.0–17.0)
Immature Granulocytes: 1 %
Lymphocytes Relative: 3 %
Lymphs Abs: 0.4 10*3/uL — ABNORMAL LOW (ref 0.7–4.0)
MCH: 33.5 pg (ref 26.0–34.0)
MCHC: 33.6 g/dL (ref 30.0–36.0)
MCV: 99.8 fL (ref 80.0–100.0)
Monocytes Absolute: 0.4 10*3/uL (ref 0.1–1.0)
Monocytes Relative: 3 %
Neutro Abs: 11.3 10*3/uL — ABNORMAL HIGH (ref 1.7–7.7)
Neutrophils Relative %: 93 %
Platelets: 231 10*3/uL (ref 150–400)
RBC: 4.12 MIL/uL — ABNORMAL LOW (ref 4.22–5.81)
RDW: 13.2 % (ref 11.5–15.5)
WBC: 12.3 10*3/uL — ABNORMAL HIGH (ref 4.0–10.5)
nRBC: 0 % (ref 0.0–0.2)

## 2023-04-07 LAB — I-STAT VENOUS BLOOD GAS, ED
Acid-Base Excess: 4 mmol/L — ABNORMAL HIGH (ref 0.0–2.0)
Bicarbonate: 31.4 mmol/L — ABNORMAL HIGH (ref 20.0–28.0)
Calcium, Ion: 1.22 mmol/L (ref 1.15–1.40)
HCT: 43 % (ref 39.0–52.0)
Hemoglobin: 14.6 g/dL (ref 13.0–17.0)
O2 Saturation: 79 %
Potassium: 4.4 mmol/L (ref 3.5–5.1)
Sodium: 137 mmol/L (ref 135–145)
TCO2: 33 mmol/L — ABNORMAL HIGH (ref 22–32)
pCO2, Ven: 56.2 mm[Hg] (ref 44–60)
pH, Ven: 7.355 (ref 7.25–7.43)
pO2, Ven: 46 mm[Hg] — ABNORMAL HIGH (ref 32–45)

## 2023-04-07 LAB — BLOOD GAS, VENOUS
Acid-base deficit: 0.4 mmol/L (ref 0.0–2.0)
Bicarbonate: 25.4 mmol/L (ref 20.0–28.0)
O2 Saturation: 91.7 %
Patient temperature: 36.6
pCO2, Ven: 44 mm[Hg] (ref 44–60)
pH, Ven: 7.37 (ref 7.25–7.43)
pO2, Ven: 62 mm[Hg] — ABNORMAL HIGH (ref 32–45)

## 2023-04-07 LAB — RESP PANEL BY RT-PCR (RSV, FLU A&B, COVID)  RVPGX2
Influenza A by PCR: NEGATIVE
Influenza B by PCR: NEGATIVE
Resp Syncytial Virus by PCR: NEGATIVE
SARS Coronavirus 2 by RT PCR: NEGATIVE

## 2023-04-07 LAB — BRAIN NATRIURETIC PEPTIDE: B Natriuretic Peptide: 160.5 pg/mL — ABNORMAL HIGH (ref 0.0–100.0)

## 2023-04-07 LAB — MAGNESIUM: Magnesium: 2.6 mg/dL — ABNORMAL HIGH (ref 1.7–2.4)

## 2023-04-07 MED ORDER — BUDESONIDE 0.25 MG/2ML IN SUSP
0.2500 mg | Freq: Two times a day (BID) | RESPIRATORY_TRACT | Status: DC
  Administered 2023-04-07 – 2023-04-09 (×3): 0.25 mg via RESPIRATORY_TRACT
  Filled 2023-04-07 (×3): qty 2

## 2023-04-07 MED ORDER — DOLUTEGRAVIR SODIUM 50 MG PO TABS
50.0000 mg | ORAL_TABLET | Freq: Every day | ORAL | Status: DC
  Administered 2023-04-08 – 2023-04-09 (×2): 50 mg via ORAL
  Filled 2023-04-07 (×2): qty 1

## 2023-04-07 MED ORDER — METHYLPREDNISOLONE SODIUM SUCC 125 MG IJ SOLR
125.0000 mg | Freq: Once | INTRAMUSCULAR | Status: AC
  Administered 2023-04-07: 125 mg via INTRAVENOUS
  Filled 2023-04-07: qty 2

## 2023-04-07 MED ORDER — ROSUVASTATIN CALCIUM 20 MG PO TABS
20.0000 mg | ORAL_TABLET | Freq: Every day | ORAL | Status: DC
  Administered 2023-04-08 – 2023-04-09 (×2): 20 mg via ORAL
  Filled 2023-04-07 (×2): qty 1

## 2023-04-07 MED ORDER — SODIUM CHLORIDE 0.9 % IV SOLN
1.0000 g | Freq: Once | INTRAVENOUS | Status: AC
  Administered 2023-04-07: 1 g via INTRAVENOUS
  Filled 2023-04-07: qty 10

## 2023-04-07 MED ORDER — UMECLIDINIUM-VILANTEROL 62.5-25 MCG/ACT IN AEPB
1.0000 | INHALATION_SPRAY | Freq: Every day | RESPIRATORY_TRACT | Status: DC
  Administered 2023-04-08 – 2023-04-09 (×2): 1 via RESPIRATORY_TRACT
  Filled 2023-04-07: qty 14

## 2023-04-07 MED ORDER — SULFAMETHOXAZOLE-TRIMETHOPRIM 800-160 MG PO TABS
1.0000 | ORAL_TABLET | Freq: Every day | ORAL | Status: DC
  Administered 2023-04-08 – 2023-04-09 (×2): 1 via ORAL
  Filled 2023-04-07 (×2): qty 1

## 2023-04-07 MED ORDER — AZITHROMYCIN 500 MG PO TABS
500.0000 mg | ORAL_TABLET | Freq: Every day | ORAL | Status: DC
  Administered 2023-04-08: 500 mg via ORAL
  Filled 2023-04-07 (×2): qty 1

## 2023-04-07 MED ORDER — ACETAMINOPHEN 650 MG RE SUPP: 650.0000 mg | Freq: Four times a day (QID) | RECTAL | Status: DC | PRN

## 2023-04-07 MED ORDER — ENOXAPARIN SODIUM 40 MG/0.4ML IJ SOSY
40.0000 mg | PREFILLED_SYRINGE | INTRAMUSCULAR | Status: DC
  Administered 2023-04-07 – 2023-04-08 (×2): 40 mg via SUBCUTANEOUS
  Filled 2023-04-07 (×2): qty 0.4

## 2023-04-07 MED ORDER — ALBUTEROL SULFATE (2.5 MG/3ML) 0.083% IN NEBU
7.5000 mg/h | INHALATION_SOLUTION | Freq: Once | RESPIRATORY_TRACT | Status: DC
  Filled 2023-04-07: qty 3

## 2023-04-07 MED ORDER — DARUN-COBIC-EMTRICIT-TENOFAF 800-150-200-10 MG PO TABS
1.0000 | ORAL_TABLET | Freq: Every day | ORAL | Status: DC
  Administered 2023-04-08 – 2023-04-09 (×2): 1 via ORAL
  Filled 2023-04-07 (×2): qty 1

## 2023-04-07 MED ORDER — SODIUM CHLORIDE 0.9 % IV SOLN
500.0000 mg | Freq: Once | INTRAVENOUS | Status: AC
  Administered 2023-04-07: 500 mg via INTRAVENOUS
  Filled 2023-04-07: qty 5

## 2023-04-07 MED ORDER — ACETAMINOPHEN 325 MG PO TABS: 650.0000 mg | ORAL_TABLET | Freq: Four times a day (QID) | ORAL | Status: DC | PRN

## 2023-04-07 MED ORDER — IPRATROPIUM-ALBUTEROL 0.5-2.5 (3) MG/3ML IN SOLN: 3.0000 mL | RESPIRATORY_TRACT | Status: DC | PRN

## 2023-04-07 MED ORDER — PREDNISONE 10 MG PO TABS
40.0000 mg | ORAL_TABLET | Freq: Every day | ORAL | Status: DC
  Administered 2023-04-08 – 2023-04-09 (×2): 40 mg via ORAL
  Filled 2023-04-07 (×2): qty 4

## 2023-04-07 MED ORDER — MAGNESIUM SULFATE 2 GM/50ML IV SOLN
2.0000 g | Freq: Once | INTRAVENOUS | Status: AC
  Administered 2023-04-07: 2 g via INTRAVENOUS
  Filled 2023-04-07: qty 50

## 2023-04-07 MED ORDER — ALBUTEROL SULFATE (2.5 MG/3ML) 0.083% IN NEBU: 10.0000 mg/h | INHALATION_SOLUTION | Freq: Once | RESPIRATORY_TRACT | Status: AC

## 2023-04-07 MED ORDER — SODIUM CHLORIDE 0.9 % IV SOLN: 1.0000 g | INTRAVENOUS | Status: DC

## 2023-04-07 MED ORDER — ALBUTEROL SULFATE (2.5 MG/3ML) 0.083% IN NEBU
INHALATION_SOLUTION | RESPIRATORY_TRACT | Status: AC
  Administered 2023-04-07: 10 mg/h via RESPIRATORY_TRACT
  Filled 2023-04-07: qty 12

## 2023-04-07 MED ORDER — BECLOMETHASONE DIPROP HFA 80 MCG/ACT IN AERB: 1.0000 | INHALATION_SPRAY | Freq: Two times a day (BID) | RESPIRATORY_TRACT | Status: DC

## 2023-04-07 MED ORDER — SENNOSIDES-DOCUSATE SODIUM 8.6-50 MG PO TABS: 1.0000 | ORAL_TABLET | Freq: Every evening | ORAL | Status: DC | PRN

## 2023-04-07 NOTE — Hospital Course (Addendum)
Patient Summary [name] is a 53 y.o. person living with a history of presumed COPD/asthma (no formal PFT completed), advanced HIV (viral load <20, CD4 87 in 02/2023), pneumocystis pneumonia, and previously treated tuberculosis who presented with worsening dyspnea, cough, and chest pain and admitted for community acquired pneumonia.   # Presumed COPD exacerbation 2/2 Community acquired pneumonia  On presentation, patient had cough with increased sputum production. He was afebrile, required 2 L of supplemental oxygen, had leukocytosis and chest x-ray with opacity concerning for community-acquired pneumonia.  Was treated for community-acquired pneumonia with ceftriaxone and azithromycin, and COPD with 5-day course of prednisone.  He worked with mobility specialist, required 2 L of supplemental oxygen with ambulation.  Will discharge home with home oxygen. Patient will need to complete 2 more days of prednisone 40 mg and Augmentin.  - Follow-up with IMTS in clinic. - Discharged with 2 L of home oxygen. - Augmentin 875-125 mg for 2 days, last day 1/26 - refilled home Anoro Ellipta and Qvar inhalers - Prednisone 40 mg (day 3/5), last day 1/26    - Continue home prophylactic Bactrim DS 800-160 mg tablet daily  #Elevated creatinine SCr 1.43, baseline Cr 1-1.2. Most likely due to poor PO intake in setting of CAP and bothersome cough. Encouraged increased PO intake.  Holding losartan due to elevated SCr. - repeat BMP outpatient   Hyperkalemia K5.5, EKG with no changes.  Repeat K4.4, initial potassium due to hemolyzed blood. - Monitor  #Type 2 diabetes mellitus  Diet controlled, recent A1c 6.1.  Mildly elevated blood glucose, likely the setting of prednisone therapy.   #Advanced HIV Follows with Dr. Drue Second from ID outpatient and patient is adherent to his ART. Most recent viral load <20 and CD4 87 in 02/2023. Currently on Symtuza and Tivicay. Continued home medications during  hospitalization.  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 Less likely that current symptoms are from CHF exacerbation. Normotensive on arrival. Held home losartan at time if discharge.   Hyperlipidemia - Resumed home rosuvastatin 20 mg.

## 2023-04-07 NOTE — Progress Notes (Signed)
10MG  ALB CAT started at 12:05 per MD order.

## 2023-04-07 NOTE — ED Notes (Signed)
Pt is dyspneic and has wheezing bilaterally

## 2023-04-07 NOTE — ED Provider Notes (Signed)
[place] EMERGENCY DEPARTMENT AT [facility] Provider Note   CSN: [id number] Arrival date & time: 04/07/23  4098     History  Chief Complaint  Patient presents with   Chest Pain   Cough   Asthma    [name] is a 53 y.o. male.  53 year old male presents today for concern of asthma exacerbation.  He states he has had cough for 4 days.  Also endorses chest pain.  Has been using albuterol inhaler without significant improvement.  The history is provided by the patient. No language interpreter was used.       Home Medications Prior to Admission medications   Medication Sig Start Date End Date Taking? Authorizing Provider  albuterol (VENTOLIN HFA) 108 (90 Base) MCG/ACT inhaler Inhale 2 puffs into the lungs every 6 (six) hours as needed for wheezing or shortness of breath. 03/22/23   Gwenevere Abbot, MD  beclomethasone (QVAR REDIHALER) 80 MCG/ACT inhaler Inhale 1 puff into the lungs 2 (two) times daily. 03/22/23 03/21/24  Gwenevere Abbot, MD  Darunavir-Cobicistat-Emtricitabine-Tenofovir Alafenamide [facility]) 800-150-200-10 MG TABS Take 1 tablet by mouth daily with breakfast. 04/01/23   Jennette Kettle, RPH-CPP  dolutegravir (TIVICAY) 50 MG tablet Take 1 tablet (50 mg total) by mouth daily. 04/01/23   Jennette Kettle, RPH-CPP  losartan (COZAAR) 25 MG tablet Take 1 tablet (25 mg total) by mouth daily. 03/22/23 03/21/24  Gwenevere Abbot, MD  rosuvastatin (CRESTOR) 20 MG tablet Take 1 tablet (20 mg total) by mouth daily. 03/22/23 03/21/24  Gwenevere Abbot, MD  sulfamethoxazole-trimethoprim (BACTRIM DS) 800-160 MG tablet Take 1 tablet by mouth daily. 02/15/23   Judyann Munson, MD  triamcinolone ointment (KENALOG) 0.1 % Apply 1 Application topically 2 (two) times daily. 02/15/23   Judyann Munson, MD  umeclidinium-vilanterol [facility] ELLIPTA) 62.5-25 MCG/ACT AEPB Inhale 1 puff into the lungs daily. 03/22/23 03/21/24  Gwenevere Abbot, MD      Allergies    Metformin and related    Review of Systems    Review of Systems  Constitutional:  Negative for chills and fever.  Respiratory:  Positive for cough and shortness of breath.   Cardiovascular:  Positive for chest pain.  Gastrointestinal:  Negative for abdominal pain.  Neurological:  Negative for light-headedness.  All other systems reviewed and are negative.   Physical Exam Updated Vital Signs BP 119/76   Pulse (!) 113   Temp 97.7 F (36.5 C) (Oral)   Resp 20   SpO2 96%  Physical Exam Vitals and nursing note reviewed.  Constitutional:      General: He is not in acute distress.    Appearance: Normal appearance. He is not ill-appearing.  HENT:     Head: Normocephalic and atraumatic.     Nose: Nose normal.  Eyes:     General: No scleral icterus.    Extraocular Movements: Extraocular movements intact.     Conjunctiva/sclera: Conjunctivae normal.  Cardiovascular:     Rate and Rhythm: Regular rhythm. Tachycardia present.  Pulmonary:     Effort: Pulmonary effort is normal. No respiratory distress.     Breath sounds: Normal breath sounds. No wheezing or rales.  Abdominal:     General: There is no distension.     Palpations: Abdomen is soft.     Tenderness: There is no abdominal tenderness.  Musculoskeletal:        General: Normal range of motion.     Cervical back: Normal range of motion.  Skin:    General: Skin  is warm and dry.  Neurological:     General: No focal deficit present.     Mental Status: He is alert. Mental status is at baseline.     ED Results / Procedures / Treatments   Labs (all labs ordered are listed, but only abnormal results are displayed) Labs Reviewed  BRAIN NATRIURETIC PEPTIDE - Abnormal; Notable for the following components:      Result Value   B Natriuretic Peptide 160.5 (*)    All other components within normal limits  CBC WITH DIFFERENTIAL/PLATELET - Abnormal; Notable for the following components:   WBC 12.3 (*)    RBC 4.12 (*)    Neutro Abs 11.3 (*)    Lymphs Abs 0.4 (*)    Abs  Immature Granulocytes 0.16 (*)    All other components within normal limits  I-STAT VENOUS BLOOD GAS, ED - Abnormal; Notable for the following components:   pO2, Ven 46 (*)    Bicarbonate 31.4 (*)    TCO2 33 (*)    Acid-Base Excess 4.0 (*)    All other components within normal limits  TROPONIN I (HIGH SENSITIVITY) - Abnormal; Notable for the following components:   Troponin I (High Sensitivity) 19 (*)    All other components within normal limits  RESP PANEL BY RT-PCR (RSV, FLU A&B, COVID)  RVPGX2  BLOOD GAS, VENOUS  COMPREHENSIVE METABOLIC PANEL  MAGNESIUM    EKG EKG Interpretation Date/Time:  Wednesday April 07 2023 11:22:04 EST Ventricular Rate:  110 PR Interval:  141 QRS Duration:  90 QT Interval:  323 QTC Calculation: 437 R Axis:   -34  Text Interpretation: Sinus tachycardia Nonspecific T wave abnormality Confirmed by Cathren Laine ([id number]) on 04/07/2023 11:23:49 AM  Radiology DG Chest Port 1 View Result Date: 04/07/2023 CLINICAL DATA:  Dyspnea. EXAM: PORTABLE CHEST 1 VIEW COMPARISON:  01/18/2023 FINDINGS: Lungs are hyperexpanded. Biapical pleuroparenchymal scarring is similar to prior. Interval progression of patchy and nodular airspace opacity in the left base, superimposed on a background of chronic interstitial coarsening. Cardiopericardial silhouette is at upper limits of normal for size. No pneumothorax. Small left pleural effusion. IMPRESSION: Interval progression of patchy and nodular airspace opacity in the left base with small left pleural effusion. Imaging features compatible with pneumonia. Close follow-up recommended to ensure resolution. Electronically Signed   By: Kennith Center M.D.   On: 04/07/2023 12:02    Procedures .Critical Care  Performed by: Marita Kansas, PA-C Authorized by: Marita Kansas, PA-C   Critical care provider statement:    Critical care time (minutes):  30   Critical care was necessary to treat or prevent imminent or life-threatening  deterioration of the following conditions:  Respiratory failure   Critical care was time spent personally by me on the following activities:  Development of treatment plan with patient or surrogate, discussions with consultants, evaluation of patient's response to treatment, examination of patient, ordering and review of laboratory studies, ordering and review of radiographic studies, ordering and performing treatments and interventions, pulse oximetry, re-evaluation of patient's condition and review of old charts     Medications Ordered in ED Medications  albuterol (PROVENTIL) (2.5 MG/3ML) 0.083% nebulizer solution (7.5 mg/hr Nebulization Not Given 04/07/23 1150)  methylPREDNISolone sodium succinate (SOLU-MEDROL) 125 mg/2 mL injection 125 mg (125 mg Intravenous Given 04/07/23 1150)  magnesium sulfate IVPB 2 g 50 mL (0 g Intravenous Stopped 04/07/23 1252)  albuterol (PROVENTIL) (2.5 MG/3ML) 0.083% nebulizer solution (10 mg/hr Nebulization Given 04/07/23 1205)    ED Course/  Medical Decision Making/ A&P                                 Medical Decision Making Amount and/or Complexity of Data Reviewed Labs: ordered. Radiology: ordered.  Risk Prescription drug management.   Medical Decision Making / ED Course   This patient presents to the ED for concern of shortness of breath, this involves an extensive number of treatment options, and is a complaint that carries with it a high risk of complications and morbidity.  The differential diagnosis includes asthma exacerbation, COPD exacerbation, viral URI, ACS, CHF  MDM: 53 year old male presents today for concern of respiratory distress.  He is requiring supplemental O2 with 2 L nasal cannula.  Does appear to be in respiratory distress with tachypnea.  He is also tachycardic.  Will provide continuous nebulizer.  CBC shows mild leukocytosis with left shift.  No anemia.  Respiratory panel negative.  Troponin elevated at 19.  BNP elevated at 160.   VBG without acute concern.  Metabolic panel hemolyzed.  Repeat pending.  Chest x-ray with evidence of pneumonia.  EKG without acute ischemic change.  Discussed with internal medicine service.  They will admit.   Lab Tests: -I ordered, reviewed, and interpreted labs.   The pertinent results include:   Labs Reviewed  BRAIN NATRIURETIC PEPTIDE - Abnormal; Notable for the following components:      Result Value   B Natriuretic Peptide 160.5 (*)    All other components within normal limits  CBC WITH DIFFERENTIAL/PLATELET - Abnormal; Notable for the following components:   WBC 12.3 (*)    RBC 4.12 (*)    Neutro Abs 11.3 (*)    Lymphs Abs 0.4 (*)    Abs Immature Granulocytes 0.16 (*)    All other components within normal limits  I-STAT VENOUS BLOOD GAS, ED - Abnormal; Notable for the following components:   pO2, Ven 46 (*)    Bicarbonate 31.4 (*)    TCO2 33 (*)    Acid-Base Excess 4.0 (*)    All other components within normal limits  TROPONIN I (HIGH SENSITIVITY) - Abnormal; Notable for the following components:   Troponin I (High Sensitivity) 19 (*)    All other components within normal limits  RESP PANEL BY RT-PCR (RSV, FLU A&B, COVID)  RVPGX2  BLOOD GAS, VENOUS  COMPREHENSIVE METABOLIC PANEL  MAGNESIUM      EKG  EKG Interpretation Date/Time:  Wednesday April 07 2023 11:22:04 EST Ventricular Rate:  110 PR Interval:  141 QRS Duration:  90 QT Interval:  323 QTC Calculation: 437 R Axis:   -34  Text Interpretation: Sinus tachycardia Nonspecific T wave abnormality Confirmed by Cathren Laine ([id number]) on 04/07/2023 11:23:49 AM         Imaging Studies ordered: I ordered imaging studies including cxr I independently visualized and interpreted imaging. I agree with the radiologist interpretation   Medicines ordered and prescription drug management: Meds ordered this encounter  Medications   methylPREDNISolone sodium succinate (SOLU-MEDROL) 125 mg/2 mL injection 125 mg     IV methylprednisolone will be converted to either a q12h or q24h frequency with the same total daily dose (TDD).  Ordered Dose: 1 to 125 mg TDD; convert to: TDD q24h.  Ordered Dose: 126 to 250 mg TDD; convert to: TDD div q12h.  Ordered Dose: >250 mg TDD; DAW.   magnesium sulfate IVPB 2 g 50 mL   albuterol (  PROVENTIL) (2.5 MG/3ML) 0.083% nebulizer solution   albuterol (PROVENTIL) (2.5 MG/3ML) 0.083% nebulizer solution   albuterol (PROVENTIL) (2.5 MG/3ML) 0.083% nebulizer solution    Clarisa Fling M: cabinet override    -I have reviewed the patients home medicines and have made adjustments as needed  Critical interventions Continuous neb, antibiotics, Solu-Medrol, magnesium  Cardiac Monitoring: The patient was maintained on a cardiac monitor.  I personally viewed and interpreted the cardiac monitored which showed an underlying rhythm of: Sinus tachycardia  Reevaluation: After the interventions noted above, I reevaluated the patient and found that they have :improved  Co morbidities that complicate the patient evaluation  Past Medical History:  Diagnosis Date   Acute pulmonary embolus ([name]) 02/24/2016   Dx December 2017 taking Eliquis 5mg  BID   Allergy 09/30/2021   Bronchiectasis with acute exacerbation ([name])    Bronchiectasis without acute exacerbation ([name]) 04/22/2021   Depression    "stress and depression for any man is common" (03/21/2014)   Diabetes mellitus without complication ([name])    DVT (deep venous thrombosis) ([name]) 11/03/2020   Dyspnea    Genital warts 01/04/2017   Hepatitis    "I don't know what hepatitis I have"   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  They will evaluate patient for admission.   Final Clinical Impression(s) / ED Diagnoses Final diagnoses:  Acute hypoxic respiratory failure ([name])  Pneumonia due to infectious organism, unspecified laterality, unspecified part of lung    Rx / DC Orders ED Discharge Orders     None         Marita Kansas, PA-C 04/07/23 1523    Cathren Laine, MD 04/09/23 [phone]

## 2023-04-07 NOTE — Progress Notes (Signed)
Late admission.  Patient arrived at 11.  Patient settled in room, lovenox administered. Patient oriented to room and unit procedures.  Questions answered.  Resting comfortably in bed.  No c/o pain.  Reported off to 7P nurse. No additional questions or concerns at this time.

## 2023-04-07 NOTE — H&P (Cosign Needed Addendum)
Date: 04/07/2023               Patient Name:  [name] MRN: [id number]  DOB: 05-16-1970 Age / Sex: 53 y.o., male   PCP: Manuela Neptune, MD         Medical Service: Internal Medicine Teaching Service         Attending Physician:  First Contact:   Dr. Mercie Eon MD   Ezekiel Slocumb, MS3     Pager:    [phone]  Second Contact: Dr. Laretta Bolster, MD Pager [phone]    Third Contact: Dr. Rana Snare, DO Pager [phone]         After Hours (After 5p/  First Contact Pager: [phone]  weekends / holidays): Second Contact Pager: [phone]   SUBJECTIVE   Chief Complaint: Shortness of breath  History of Present Illness: [name] is a 2 yer old person with prior medical history of presumed asthma/COPD (no PFTs), advanced HIV (CD4 count 87 and viral load <20 in 02/2023), pneumocystis pneumonia, and previously treated tuberculosis presents to the ED with 1-2 days of SOB, wheezing, productive cough, and chest pain. Patient has cough and SOB with exertion baseline, but cough has progressed and become productive with yellow sputum. He now also has dyspnea at rest. He attributes the cough to the chest pain and localizes it to the entire left side of his chest. He was previously on home oxygen, unsure of rate, but had to stop 2 weeks ago due to cost. He denies fever, chills, leg swelling, orthopnea, abdominal pain, dizziness or lightheadedness, night sweats, weight loss, or rash. He did vomit once 2 days ago, but denies any current nausea or vomiting. His most recent travel outside of the [place] was 2 years ago.       ED Course: CXR- showed increased opacity of left lower lobe consistent with pneumonia and small pleural effusion at left lung base Diffuse wheezing, was given magnesium solumedrol and started on 2 L [place], breathing improved. EKG - negative for ischemic change, sinus tachycardia  Started on ceftriaxone and azithromycin    Meds:  Current Outpatient  Medications (Cardiovascular):    losartan (COZAAR) 25 MG tablet, Take 1 tablet (25 mg total) by mouth daily. - patient unsure   rosuvastatin (CRESTOR) 20 MG tablet, Take 1 tablet (20 mg total) by mouth daily. - patient unsure   Current Outpatient Medications (Respiratory):    albuterol (VENTOLIN HFA) 108 (90 Base) MCG/ACT inhaler, Inhale 2 puffs into the lungs every 6 (six) hours as needed for wheezing or shortness of breath.    beclomethasone (QVAR REDIHALER) 80 MCG/ACT inhaler, Inhale 1 puff into the lungs 2 (two) times daily. - patient confirmed   umeclidinium-vilanterol (ANORO ELLIPTA) 62.5-25 MCG/ACT AEPB, Inhale 1 puff into the lungs daily. -patient confirmed     Current Outpatient Medications (Other):    Darunavir-Cobicistat-Emtricitabine-Tenofovir Alafenamide (SYMTUZA) 800-150-200-10 MG TABS, Take 1 tablet by mouth daily with breakfast. - patient confirmed   dolutegravir (TIVICAY) 50 MG tablet, Take 1 tablet (50 mg total) by mouth daily. - patient confirmed   sulfamethoxazole-trimethoprim (BACTRIM DS) 800-160 MG tablet daily. 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) - Hypertension - Hyperlipidemia Past Medical History:  Diagnosis Date   Acute pulmonary embolus ([name]) 02/24/2016   Dx December 2017 taking Eliquis 5mg  BID   Allergy 09/30/2021   Bronchiectasis with acute exacerbation ([name])    Bronchiectasis without acute  exacerbation ([name]) 04/22/2021   Depression    "stress and depression for any man is common" (03/21/2014)   Diabetes mellitus without complication ([name])    DVT (deep venous thrombosis) ([name]) 11/03/2020   Dyspnea    Genital warts 01/04/2017   Hepatitis    "I don't know what hepatitis I have"   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   History of tuberculosis 11/01/2020   HIV disease ([name])    MSSA bacteremia    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)    S/P ORIF (open reduction internal fixation) fracture 03/21/2014   Steroid-induced hyperglycemia 09/05/2021   TB (pulmonary tuberculosis)    previously treated according to refugee documentation    Past Surgical History:  Procedure Laterality Date   BRONCHIAL WASHINGS Bilateral 04/23/2021   Procedure: BRONCHIAL WASHINGS;  Surgeon: Lorin Glass, MD;  Location: [facility] ENDOSCOPY;  Service: Pulmonary;  Laterality: Bilateral;   FRACTURE SURGERY     IM NAILING TIBIA Right 03/21/2014   ORIF ANKLE FRACTURE Right 03/21/2014   lateral malleolus/notes 03/21/2014   ORIF ANKLE FRACTURE Right 03/21/2014   Procedure: OPEN REDUCTION INTERNAL FIXATION (ORIF) pilon ;  Surgeon: Eldred Manges, MD;  Location: [facility] OR;  Service: Orthopedics;  Laterality: Right;   TEE WITHOUT CARDIOVERSION N/A 05/23/2019   Procedure: TRANSESOPHAGEAL ECHOCARDIOGRAM (TEE);  Surgeon: Chilton Si, MD;  Location: [facility] ENDOSCOPY;  Service: Cardiovascular;  Laterality: N/A;   TIBIA IM NAIL INSERTION Right 03/21/2014   Procedure: INTRAMEDULLARY (IM) NAIL TIBIAL;  Surgeon: Eldred Manges, MD;  Location: [facility] OR;  Service: Orthopedics;  Laterality: Right;   VIDEO BRONCHOSCOPY Bilateral 03/02/2016   Procedure: VIDEO BRONCHOSCOPY WITHOUT FLUORO;  Surgeon: Roslynn Amble, MD;  Location: [facility] ENDOSCOPY;  Service: Cardiopulmonary;  Laterality: Bilateral;   VIDEO BRONCHOSCOPY Left 04/23/2021   Procedure: VIDEO BRONCHOSCOPY WITHOUT FLUORO;  Surgeon: Lorin Glass, MD;  Location: [facility] ENDOSCOPY;  Service: Pulmonary;  Laterality: Left;   Social:  Lives: alone Occupation: Radiation protection practitioner Level of Function: independent with ADL and IADL  PCP: Hassan Rowan, [name], [place] Substances: -Tobacco: denies, former quit 20 years ago  -Alcohol: denies -Recreational Drug: denies  Family History:  Family History  Problem Relation Age of Onset   Hypertension Other    Heart disease Sister   - Asthma in  aunt  Allergies: Allergies as of 04/07/2023 - Review Complete 04/07/2023  Allergen Reaction Noted   Metformin and related Rash 02/27/2021    Review of Systems: A complete ROS was negative except as per HPI.   OBJECTIVE:   Physical Exam: Physical Exam Constitutional:      General: He is not in acute distress. Cardiovascular:     Rate and Rhythm: Regular rhythm. Tachycardia present.     Pulses:          Radial pulses are 2+ on the right side and 2+ on the left side.       Dorsalis pedis pulses are 2+ on the right side and 2+ on the left side.     Heart sounds: Normal heart sounds.  Pulmonary:     Effort: Accessory muscle usage present.     Comments: Mild inspiratory and expiratory wheezing noted diffusely, more prominent on L side Abdominal:     General: Bowel sounds are normal.     Palpations: Abdomen is soft.     Tenderness: There is no abdominal tenderness.     Comments: Colostomy  bag in LLQ. Site is clean with no erythema or edema and brown stool is present in the colostomy bag.  Musculoskeletal:     Right lower leg: No edema.     Left lower leg: No edema.  Skin:    General: Skin is warm and dry.     Capillary Refill: Capillary refill takes less than 2 seconds.  Neurological:     Mental Status: He is alert.  Psychiatric:        Behavior: Behavior normal.    Labs: CBC    Component Value Date/Time   WBC 12.3 (H) 04/07/2023 1132   RBC 4.12 (L) 04/07/2023 1132   HGB 14.6 04/07/2023 1212   HGB 16.1 09/03/2021 1215   HCT 43.0 04/07/2023 1212   HCT 47.3 09/03/2021 1215   PLT 231 04/07/2023 1132   PLT 266 09/03/2021 1215   MCV 99.8 04/07/2023 1132   MCV 98 (H) 09/03/2021 1215   MCH 33.5 04/07/2023 1132   MCHC 33.6 04/07/2023 1132   RDW 13.2 04/07/2023 1132   RDW 11.9 09/03/2021 1215   LYMPHSABS 0.4 (L) 04/07/2023 1132   MONOABS 0.4 04/07/2023 1132   EOSABS 0.0 04/07/2023 1132   BASOSABS 0.0 04/07/2023 1132     CMP   Latest Reference Range & Units 04/07/23  14:53  COMPREHENSIVE METABOLIC PANEL  Rpt !  Sodium 135 - 145 mmol/L 132 (L)  Potassium 3.5 - 5.1 mmol/L 4.1  Chloride 98 - 111 mmol/L 96 (L)  CO2 22 - 32 mmol/L 21 (L)  Glucose 70 - 99 mg/dL 161 (H)  BUN 6 - 20 mg/dL 27 (H)  Creatinine 0.96 - 1.24 mg/dL 0.45 (H)  Calcium 8.9 - 10.3 mg/dL 9.4  Anion gap 5 - 15  15  Magnesium 1.7 - 2.4 mg/dL 2.6 (H)  Alkaline Phosphatase 38 - 126 U/L 73  Albumin 3.5 - 5.0 g/dL 2.7 (L)  AST 15 - 41 U/L 14 (L)  ALT 0 - 44 U/L 16  Total Protein 6.5 - 8.1 g/dL 7.8  Total Bilirubin 0.0 - 1.2 mg/dL 0.5  GFR, Estimated >40 mL/min 54 (L)  !: Data is abnormal (L): Data is abnormally low (H): Data is abnormally high Rpt: View report in Results Review for more information   Latest Reference Range & Units 04/07/23 11:32  B Natriuretic Peptide 0.0 - 100.0 pg/mL 160.5 (H)  (H): Data is abnormally high  Imaging: DG Chest Port 1 View Result Date: 04/07/2023 CLINICAL DATA:  Dyspnea. EXAM: PORTABLE CHEST 1 VIEW COMPARISON:  01/18/2023 FINDINGS: Lungs are hyperexpanded. Biapical pleuroparenchymal scarring is similar to prior. Interval progression of patchy and nodular airspace opacity in the left base, superimposed on a background of chronic interstitial coarsening. Cardiopericardial silhouette is at upper limits of normal for size. No pneumothorax. Small left pleural effusion. IMPRESSION: Interval progression of patchy and nodular airspace opacity in the left base with small left pleural effusion. Imaging features compatible with pneumonia. Close follow-up recommended to ensure resolution. Electronically Signed   By: Kennith Center M.D.   On: 04/07/2023 12:02   EKG: personally reviewed my interpretation is sinus tachycardia Nonspecific T wave abnormality. Prior EKG November 2024 showed, ventricular premature complex, left anterior fascicular block, and abnormal R-wave progression, late transition.  ASSESSMENT & PLAN:  Assessment & Plan by Problem: Active Problems:    CAP (community acquired pneumonia)   [name] is a 53 y.o. person living with a history of presumed COPD/asthma (no formal PFT completed), advanced HIV (viral  load <20, CD4 87 in 02/2023), pneumocystis pneumonia, and previously treated tuberculosis who presented with worsening dyspnea, cough, and chest pain and admitted for community acquired pneumonia on hospital day 0  Presumed COPD exacerbation 2/2 Community acquired pneumonia  Patient states that he has history of asthma and per chart review has been treated for presumed COPD exacerbations in the past, but no formal PFTs on record. Cough with increased dyspnea, and increased production with changed quality of sputum meets diagnostic criteria for COPD exacerbation. Patient is currently afebrile, but has leukocytosis and opacity on CXR that is most consistent with community acquired pneumonia. Less concerned for opportunistic infections at this time given patient adherence with Bactrim prophylaxis. If patient's clinical status does not improve will extend antibiotic coverage, and evaluate need for work up  [employer] of patient's base respiratory status given history of home oxygen that was stopped due to cost. Will evaluate patient's tolerance to be weaned off of oxygen. - Continue Ceftriaxone 1 g IV daily (1/5) - Continue azithromycin 500 mg daily (day 1/3) - Contiue home QVAR Redihaler 1 puff BID and Anoro Ellipta 1 puff daily - Received solumedrol magnesium in ED, will continue with prednisone 40 mg tomorrow to complete 5 day course   - Duonebs Q4H PRN - Wean off oxygen as tolerated. - Continue home prophylactic Bactrim DS 800-160 mg tablet daily  Advanced HIV Follows with Dr. Drue Second from ID outpatient and patient is adherent to his ART. Most recent viral load <20 and CD4 87 in 02/2023. Currently on Symtuza and Tivicay. Will continue home medications.  - Symtuza 800-150-200-10 mg 1 tablet daily - Tivicay 50 mg daily  Chronic systolic  heart failure with recovered EF (50% 2023) Hypertension BNP is elevated to 160, but patient is currently euvolemic on exam with no orthopnea. Less likely that current symptoms are from CHF exacerbation. Normotensive on arrival. Will hold at this time and follow BP. Will restart if indicated.   Chronic stable conditions Hyperlipidemia  Patient unsure if he has been taking his rosuvastatin, will restart tomorrow. - Start rosuvastatin 20 mg tomorrow  Type 2 diabetes mellitus  Currently diet controlled. Most recent A1c 6.1. Will not start SSI at this time.   Diet: Carb-Modified VTE: Enoxaparin IVF: None,None Code: Full  Prior to Admission Living Arrangement: Home, living alone Anticipated Discharge Location: Home Barriers to Discharge:  Respiratory status  Dispo: Admit patient to Observation with expected length of stay less than 2 midnights.  Signed: Gilmer Mor, Medical Student Medical Student  Attestation for Student Documentation:  I personally was present and re-performed the history, physical exam and medical decision-making activities of this service and have verified that the service and findings are accurately documented in the student's note.  Laretta Bolster, MD 04/07/2023, 6:42 PM   04/07/2023, 5:09 PM

## 2023-04-07 NOTE — ED Triage Notes (Signed)
EMS stated  Pt coming from home with chest wall pain , asthma, and a cough for 4 days. Hx of asthma. Pt has used his inhaler and its not working.

## 2023-04-08 DIAGNOSIS — J9601 Acute respiratory failure with hypoxia: Principal | ICD-10-CM

## 2023-04-08 DIAGNOSIS — J189 Pneumonia, unspecified organism: Secondary | ICD-10-CM | POA: Diagnosis not present

## 2023-04-08 LAB — CBC
HCT: 41 % (ref 39.0–52.0)
Hemoglobin: 13.8 g/dL (ref 13.0–17.0)
MCH: 33.3 pg (ref 26.0–34.0)
MCHC: 33.7 g/dL (ref 30.0–36.0)
MCV: 98.8 fL (ref 80.0–100.0)
Platelets: 222 10*3/uL (ref 150–400)
RBC: 4.15 MIL/uL — ABNORMAL LOW (ref 4.22–5.81)
RDW: 12.2 % (ref 11.5–15.5)
WBC: 13.8 10*3/uL — ABNORMAL HIGH (ref 4.0–10.5)
nRBC: 0 % (ref 0.0–0.2)

## 2023-04-08 LAB — BASIC METABOLIC PANEL
Anion gap: 10 (ref 5–15)
BUN: 33 mg/dL — ABNORMAL HIGH (ref 6–20)
CO2: 28 mmol/L (ref 22–32)
Calcium: 9.4 mg/dL (ref 8.9–10.3)
Chloride: 92 mmol/L — ABNORMAL LOW (ref 98–111)
Creatinine, Ser: 1.29 mg/dL — ABNORMAL HIGH (ref 0.61–1.24)
GFR, Estimated: >60 mL/min (ref >60)
Glucose, Bld: 257 mg/dL — ABNORMAL HIGH (ref 70–99)
Potassium: 5 mmol/L (ref 3.5–5.1)
Sodium: 130 mmol/L — ABNORMAL LOW (ref 135–145)

## 2023-04-08 LAB — GLUCOSE, CAPILLARY
Glucose-Capillary: 248 mg/dL — ABNORMAL HIGH (ref 70–99)
Glucose-Capillary: 341 mg/dL — ABNORMAL HIGH (ref 70–99)
Glucose-Capillary: 274 mg/dL — ABNORMAL HIGH (ref 70–99)

## 2023-04-08 MED ORDER — SODIUM CHLORIDE 0.9 % IV SOLN
2.0000 g | INTRAVENOUS | Status: DC
  Administered 2023-04-08: 2 g via INTRAVENOUS
  Filled 2023-04-08: qty 20

## 2023-04-08 MED ORDER — BENZONATATE 100 MG PO CAPS
200.0000 mg | ORAL_CAPSULE | Freq: Once | ORAL | Status: AC
Start: 2023-04-08 — End: 2023-04-08
  Administered 2023-04-08: 200 mg via ORAL
  Filled 2023-04-08: qty 2

## 2023-04-08 MED ORDER — INSULIN ASPART 100 UNIT/ML IJ SOLN
0.0000 [IU] | Freq: Three times a day (TID) | INTRAMUSCULAR | Status: DC
  Administered 2023-04-08: 2 [IU] via SUBCUTANEOUS
  Administered 2023-04-08: 4 [IU] via SUBCUTANEOUS
  Administered 2023-04-09 (×2): 2 [IU] via SUBCUTANEOUS

## 2023-04-08 NOTE — TOC CM/SW Note (Signed)
Transition of Care [facility]) - Inpatient Brief Assessment   Patient Details  Name: [name] MRN: [id number] Date of Birth: [birth year]  [facility] CM/SW Contact:    Harriet Masson, RN Phone Number: 04/08/2023, 12:28 PM   Clinical Narrative:  Spoke to patient regarding transition needs.  Patient lives alone and takes taxi for transport. Patient admitted for + flu. Currently on RA.  No TOC needs at this time.  Transition of Care Asessment: Insurance and Status: Insurance coverage has been reviewed Patient has primary care physician: Yes Home environment has been reviewed: safe to discharge home Prior level of function:: independent Prior/Current Home Services: No current home services Social Drivers of Health Review: SDOH reviewed no interventions necessary Readmission risk has been reviewed: Yes Transition of care needs: no transition of care needs at this time

## 2023-04-08 NOTE — Discharge Instructions (Addendum)
Mr. Anthony, Parsons came to the hospital due to cough, shortness of breath, and chest pain. You were found to have pneumonia and were admitted because you needed oxygen. Your symptoms improved in the hospital and you were discharged home. You will continue your antibiotic treatment at home over the next several days.   These are your medication changes: Stop taking these medications  Losartan 25 mg daily  Ellipta inhaler 1 puff daily   Qvar inhaler 1 puff twice a day Start taking these medications   Augmentin 875-125 mg daily for 2 days  Prednisone 40 mg daily for 2 days Rosuvastatin 20 mg daily  Breztri inhaler 1 puff twice a day  Please continue all of your other medications as previously prescribed.   You will need to follow up with our practice for a post hospitalization appointment in the [facility] Internal Medicine clinic on February 5th 2025 at 9:45 am with Dr. Colbert Coyer. If your symptoms worsen you can call the clinic or return to the emergency department for further evaluation.  It has been a pleasure taking care of you, [name], MS3 [facility] Attestation for Student Documentation:  I personally was present and re-performed the history, physical exam and medical decision-making activities of this service and have verified that the service and findings are accurately documented in the student's note.  Laretta Bolster, MD 04/09/2023, 11:30 AM

## 2023-04-08 NOTE — Progress Notes (Signed)
Patient walked in the hallway without oxygen and O2 SATs dropped to 89%. He will need 1 lit oxygen while walking.    Patient is currently on RA while resting.

## 2023-04-08 NOTE — Progress Notes (Signed)
Pt with non production dry cough, request something for cough, Kathleen Lime MD, notified, new order received.

## 2023-04-08 NOTE — Plan of Care (Signed)
?  Problem: Health Behavior/Discharge Planning: ?Goal: Ability to manage health-related needs will improve ?Outcome: Progressing ?  ?Problem: Clinical Measurements: ?Goal: Ability to maintain clinical measurements within normal limits will improve ?Outcome: Progressing ?Goal: Will remain free from infection ?Outcome: Progressing ?Goal: Diagnostic test results will improve ?Outcome: Progressing ?Goal: Respiratory complications will improve ?Outcome: Progressing ?Goal: Cardiovascular complication will be avoided ?Outcome: Progressing ?  ?Problem: Clinical Measurements: ?Goal: Will remain free from infection ?Outcome: Progressing ?  ?Problem: Clinical Measurements: ?Goal: Diagnostic test results will improve ?Outcome: Progressing ?  ?Problem: Clinical Measurements: ?Goal: Respiratory complications will improve ?Outcome: Progressing ?  ?Problem: Clinical Measurements: ?Goal: Cardiovascular complication will be avoided ?Outcome: Progressing ?  ?Problem: Activity: ?Goal: Risk for activity intolerance will decrease ?Outcome: Progressing ?  ?

## 2023-04-08 NOTE — Progress Notes (Addendum)
HD#0 SUBJECTIVE:  Patient Summary: [name] is a 53 y.o. with a pertinent PMH of advanced HIV and presumed COPD, who presented with acute cough with increase sputum production and shortness of breath and admitted for CAP and COPD exacerbation.   Overnight Events: Patient still had frequent cough overnight so was given tessalon pearls with mild improvement   Interim History: Patient reports feeling better this morning. His breathing has improved and he denies shortness of breath or dizziness while walking with the nurse on room air this morning. Patient says his cough is slightly better but still very bothersome. His chest pain has improved as well.   OBJECTIVE:  Vital Signs: Vitals:   04/08/23 0344 04/08/23 0641 04/08/23 0827 04/08/23 0833  BP: 113/75 116/79 112/78   Pulse: 76 76 77   Resp:   18   Temp: 97.8 F (36.6 C) 97.9 F (36.6 C) 98 F (36.7 C)   TempSrc: Oral Oral Oral   SpO2: 99% 97% 97% 92%   Supplemental O2: Room Air SpO2: 92 % O2 Flow Rate (L/min): 2 L/min  There were no vitals filed for this visit.   Intake/Output Summary (Last 24 hours) at 04/08/2023 1453 Last data filed at 04/07/2023 2200 Gross per 24 hour  Intake 240 ml  Output --  Net 240 ml   Net IO Since Admission: 240 mL [04/08/23 1453]  Physical Exam: Physical Exam Constitutional:      General: He is not in acute distress. Cardiovascular:     Rate and Rhythm: Normal rate and regular rhythm.     Heart sounds: Normal heart sounds.  Pulmonary:     Effort: Pulmonary effort is normal.     Breath sounds: Normal breath sounds.  Abdominal:     General: Bowel sounds are normal.  Musculoskeletal:     Right lower leg: No edema.     Left lower leg: No edema.  Skin:    General: Skin is warm and dry.     Coloration: Skin is not cyanotic.  Neurological:     Mental Status: He is alert.     Patient Lines/Drains/Airways Status     Active Line/Drains/Airways     Name Placement date  Placement time Site Days   Peripheral IV 04/07/23 20 G Left Antecubital 04/07/23  1150  Antecubital  1   Colostomy LLQ --  --  LLQ  --   Wound / Incision (Open or Dehisced) 11/11/18 11/11/18  0900  --  1609             ASSESSMENT/PLAN:  Assessment:  Active Problems:   CAP (community acquired pneumonia)   Pneumonia due to infectious organism   Acute hypoxic respiratory failure ([name])  Patient Summary [name] is a 53 y.o. person living with a history of presumed COPD/asthma (no formal PFT completed), advanced HIV (viral load <20, CD4 87 in 02/2023), pneumocystis pneumonia, and previously treated tuberculosis who presented with worsening dyspnea, cough, and chest pain and admitted for community acquired pneumonia on hospital day 0   Plan:  # Presumed COPD exacerbation 2/2 Community acquired pneumonia  Patient states that he has history of asthma and per chart review has been treated for presumed COPD exacerbations in the past, but no formal PFTs on record. Cough with increased dyspnea, and increased production with changed quality of sputum meets diagnostic criteria for COPD exacerbation. Patient is currently afebrile, but has leukocytosis and opacity on CXR that is most consistent with community acquired pneumonia.  Less concerned for opportunistic infections at this time given patient adherence with Bactrim prophylaxis. Clinical status improved with no accessory muscle use or wheezing on physical exam today. Patient has tolerated being on room air well today without significant desaturation while ambulating. If patient's clinical status worsens will extend antibiotic coverage, and evaluate need for work up  [employer] of patient's base respiratory status given history of home oxygen that was stopped due to cost. Will evaluate patient's tolerance to be weaned off of oxygen. - Continue Ceftriaxone 1 g IV daily (2/5) - Continue azithromycin 500 mg daily (day 2/3) - Contiue home Anoro  Ellipta 1 puff daily - Continue Pulmicort nebulizer BID in place of home Qvar inhaler - Continue prednisone 40 mg (2/5)   - Duonebs Q4H PRN - Continue home prophylactic Bactrim DS 800-160 mg tablet daily   #Type 2 diabetes mellitus  Currently diet controlled. Most recent A1c 6.1. Fasting glucose elevated to 257 this morning. Most likely due to oral prednisone therapy. Will trend fasting glucose to see if stable prior to discharge.  - SSI while in hospital  - AM BMP  #Advanced HIV Follows with Dr. Drue Second from ID outpatient and patient is adherent to his ART. Most recent viral load <20 and CD4 87 in 02/2023. Currently on Symtuza and Tivicay. Will continue home medications.  - Symtuza 800-150-200-10 mg 1 tablet daily - Tivicay 50 mg daily   #Chronic systolic heart failure with recovered EF (50% 2023) #Hypertension BNP is elevated to 160, but patient is currently euvolemic on exam with no orthopnea. Less likely that current symptoms are from CHF exacerbation. Normotensive on arrival. Will hold home losartan at this time and follow BP. Will restart if indicated.    Chronic stable conditions Hyperlipidemia  Patient unsure if he has been taking his rosuvastatin. - Restarted home rosuvastatin 20 mg    Best Practice: Diet: Diabetic diet IVF: Fluids: None, Rate: None VTE: enoxaparin (LOVENOX) injection 40 mg Start: 04/07/23 1645 Code: Full AB: Azithromycin and Ceftriaxone  Therapy Recs: Pending, DME: none Family Contact: Aime Mukendi , notified by patient. DISPO: Anticipated discharge tomorrow to Home pending  stable glucoise and oxygenation .  Signature: Ezekiel Slocumb  Medical Student, MS3 Pager: [phone] 2:53 PM, 04/08/2023  Attestation for Student Documentation:  I personally was present and re-performed the history, physical exam and medical decision-making activities of this service and have verified that the service and findings are accurately documented in the student's  note.  Laretta Bolster, MD 04/08/2023, 3:55 PM   **Please contact the on call pager after 5 pm and on weekends at [phone].**

## 2023-04-08 NOTE — Plan of Care (Signed)

## 2023-04-09 ENCOUNTER — Other Ambulatory Visit (HOSPITAL_COMMUNITY): Payer: Self-pay

## 2023-04-09 DIAGNOSIS — J441 Chronic obstructive pulmonary disease with (acute) exacerbation: Secondary | ICD-10-CM | POA: Diagnosis present

## 2023-04-09 DIAGNOSIS — E1165 Type 2 diabetes mellitus with hyperglycemia: Secondary | ICD-10-CM | POA: Diagnosis present

## 2023-04-09 DIAGNOSIS — J9601 Acute respiratory failure with hypoxia: Secondary | ICD-10-CM | POA: Diagnosis present

## 2023-04-09 DIAGNOSIS — J449 Chronic obstructive pulmonary disease, unspecified: Secondary | ICD-10-CM | POA: Insufficient documentation

## 2023-04-09 DIAGNOSIS — I5042 Chronic combined systolic (congestive) and diastolic (congestive) heart failure: Secondary | ICD-10-CM | POA: Diagnosis present

## 2023-04-09 DIAGNOSIS — Z7951 Long term (current) use of inhaled steroids: Secondary | ICD-10-CM | POA: Diagnosis not present

## 2023-04-09 DIAGNOSIS — E875 Hyperkalemia: Secondary | ICD-10-CM | POA: Diagnosis present

## 2023-04-09 DIAGNOSIS — E785 Hyperlipidemia, unspecified: Secondary | ICD-10-CM | POA: Diagnosis present

## 2023-04-09 DIAGNOSIS — Z8249 Family history of ischemic heart disease and other diseases of the circulatory system: Secondary | ICD-10-CM | POA: Diagnosis not present

## 2023-04-09 DIAGNOSIS — Z825 Family history of asthma and other chronic lower respiratory diseases: Secondary | ICD-10-CM | POA: Diagnosis not present

## 2023-04-09 DIAGNOSIS — Z8611 Personal history of tuberculosis: Secondary | ICD-10-CM | POA: Diagnosis not present

## 2023-04-09 DIAGNOSIS — T380X5A Adverse effect of glucocorticoids and synthetic analogues, initial encounter: Secondary | ICD-10-CM | POA: Diagnosis present

## 2023-04-09 DIAGNOSIS — J440 Chronic obstructive pulmonary disease with acute lower respiratory infection: Secondary | ICD-10-CM | POA: Diagnosis present

## 2023-04-09 DIAGNOSIS — B20 Human immunodeficiency virus [HIV] disease: Secondary | ICD-10-CM | POA: Diagnosis present

## 2023-04-09 DIAGNOSIS — Z86711 Personal history of pulmonary embolism: Secondary | ICD-10-CM | POA: Diagnosis not present

## 2023-04-09 DIAGNOSIS — J189 Pneumonia, unspecified organism: Secondary | ICD-10-CM | POA: Diagnosis present

## 2023-04-09 DIAGNOSIS — Z79899 Other long term (current) drug therapy: Secondary | ICD-10-CM | POA: Diagnosis not present

## 2023-04-09 DIAGNOSIS — Z1152 Encounter for screening for COVID-19: Secondary | ICD-10-CM | POA: Diagnosis not present

## 2023-04-09 DIAGNOSIS — I110 Hypertensive heart disease with heart failure: Secondary | ICD-10-CM | POA: Diagnosis present

## 2023-04-09 LAB — BASIC METABOLIC PANEL
Anion gap: 9 (ref 5–15)
BUN: 42 mg/dL — ABNORMAL HIGH (ref 6–20)
CO2: 29 mmol/L (ref 22–32)
Calcium: 9.8 mg/dL (ref 8.9–10.3)
Chloride: 101 mmol/L (ref 98–111)
Creatinine, Ser: 1.43 mg/dL — ABNORMAL HIGH (ref 0.61–1.24)
GFR, Estimated: 59 mL/min — ABNORMAL LOW (ref >60)
Glucose, Bld: 216 mg/dL — ABNORMAL HIGH (ref 70–99)
Potassium: 5.5 mmol/L — ABNORMAL HIGH (ref 3.5–5.1)
Sodium: 139 mmol/L (ref 135–145)

## 2023-04-09 LAB — CBC
HCT: 40.9 % (ref 39.0–52.0)
Hemoglobin: 13.7 g/dL (ref 13.0–17.0)
MCH: 33 pg (ref 26.0–34.0)
MCHC: 33.5 g/dL (ref 30.0–36.0)
MCV: 98.6 fL (ref 80.0–100.0)
Platelets: 272 10*3/uL (ref 150–400)
RBC: 4.15 MIL/uL — ABNORMAL LOW (ref 4.22–5.81)
RDW: 12.2 % (ref 11.5–15.5)
WBC: 15.4 10*3/uL — ABNORMAL HIGH (ref 4.0–10.5)
nRBC: 0.2 % (ref 0.0–0.2)

## 2023-04-09 LAB — POTASSIUM: Potassium: 4.4 mmol/L (ref 3.5–5.1)

## 2023-04-09 LAB — GLUCOSE, CAPILLARY
Glucose-Capillary: 238 mg/dL — ABNORMAL HIGH (ref 70–99)
Glucose-Capillary: 206 mg/dL — ABNORMAL HIGH (ref 70–99)

## 2023-04-09 MED ORDER — AMOXICILLIN-POT CLAVULANATE 875-125 MG PO TABS
1.0000 | ORAL_TABLET | Freq: Two times a day (BID) | ORAL | 0 refills | Status: AC
  Filled 2023-04-09: qty 5, 3d supply, fill #0

## 2023-04-09 MED ORDER — BREZTRI AEROSPHERE 160-9-4.8 MCG/ACT IN AERO
1.0000 | INHALATION_SPRAY | Freq: Two times a day (BID) | RESPIRATORY_TRACT | 0 refills | Status: DC
  Filled 2023-04-09: qty 10.7, 30d supply, fill #0

## 2023-04-09 MED ORDER — AMOXICILLIN-POT CLAVULANATE 875-125 MG PO TABS
1.0000 | ORAL_TABLET | Freq: Two times a day (BID) | ORAL | Status: DC
  Administered 2023-04-09: 1 via ORAL
  Filled 2023-04-09: qty 1

## 2023-04-09 MED ORDER — PREDNISONE 20 MG PO TABS
40.0000 mg | ORAL_TABLET | Freq: Every day | ORAL | 0 refills | Status: AC
Start: 2023-04-10 — End: 2023-04-12
  Filled 2023-04-09: qty 4, 2d supply, fill #0

## 2023-04-09 MED ORDER — BECLOMETHASONE DIPROP HFA 80 MCG/ACT IN AERB
1.0000 | INHALATION_SPRAY | Freq: Two times a day (BID) | RESPIRATORY_TRACT | 11 refills | Status: DC
  Filled 2023-04-09: qty 10.6, 25d supply, fill #0

## 2023-04-09 MED ORDER — ANORO ELLIPTA 62.5-25 MCG/ACT IN AEPB
1.0000 | INHALATION_SPRAY | Freq: Every day | RESPIRATORY_TRACT | 5 refills | Status: DC
  Filled 2023-04-09: qty 60, 30d supply, fill #0

## 2023-04-09 NOTE — Progress Notes (Addendum)
  Redgie Grayer, RN  Registered Nurse Nursing   Progress Notes     Addendum   Date of Service: 04/09/2023  8:24 AM    SATURATION QUALIFICATIONS: (This note is used to comply with regulatory documentation for home oxygen)   Patient Saturations on Room Air at Rest = 94%   Patient Saturations on Room Air while Ambulating = 85%   Patient Saturations on 2 Liters of oxygen while Ambulating = 92%   Please briefly explain why patient needs home oxygen: O2 Saturation dropped to 85%while walking. Patient will need 2 liter oxygen.     Revision History   prog

## 2023-04-09 NOTE — TOC Transition Note (Signed)
Transition of Care [facility]) - Discharge Note   Patient Details  Name: [name] MRN: [id number] Date of Birth: 1970-10-02  Transition of Care [facility]) CM/SW Contact:  Harriet Masson, RN Phone Number: 04/09/2023, 12:37 PM   Clinical Narrative:    Patient stable for discharge.  Orders for home 02. Mitch with [employer] notified.  Patient requesting taxi voucher for discharge.  Transportation wavier signed and on the chart.  No other TOC needs at this time.    Final next level of care: Home/Self Care Barriers to Discharge: Barriers Resolved   Patient Goals and CMS Choice Patient states their goals for this hospitalization and ongoing recovery are:: return home          Discharge Placement                 home      Discharge Plan and Services Additional resources added to the After Visit Summary for                  DME Arranged: Oxygen DME Agency: [employer] Date DME Agency Contacted: 04/09/23 Time DME Agency Contacted: [id number] Representative spoke with at DME Agency: Marthann Schiller            Social Drivers of Health (SDOH) Interventions SDOH Screenings   Food Insecurity: No Food Insecurity (04/07/2023)  Housing: Low Risk  (04/07/2023)  Transportation Needs: No Transportation Needs (04/07/2023)  Utilities: Not At Risk (04/07/2023)  Depression (PHQ2-9): Low Risk  (03/22/2023)  Tobacco Use: Medium Risk (03/22/2023)     Readmission Risk Interventions    04/09/2023   12:37 PM 08/21/2022    3:13 PM 07/02/2022   12:34 PM  Readmission Risk Prevention Plan  Post Dischage Appt   Complete  Medication Screening   Complete  Transportation Screening Complete Complete Complete  PCP or Specialist Appt within 5-7 Days Complete Complete   Home Care Screening Complete Complete   Medication Review (RN CM) Complete Complete

## 2023-04-09 NOTE — Plan of Care (Signed)

## 2023-04-09 NOTE — Inpatient Diabetes Management (Signed)
Inpatient Diabetes Program Recommendations  AACE/ADA: New Consensus Statement on Inpatient Glycemic Control (2015)  Target Ranges:  Prepandial:   less than 140 mg/dL      Peak postprandial:   less than 180 mg/dL (1-2 hours)      Critically ill patients:  140 - 180 mg/dL   Lab Results  Component Value Date   GLUCAP 238 (H) 04/09/2023   HGBA1C 6.1 (A) 03/22/2023    Review of Glycemic Control  Latest Reference Range & Units 04/08/23 11:06 04/08/23 15:51 04/08/23 21:14 04/09/23 07:32  Glucose-Capillary 70 - 99 mg/dL 409 (H) 811 (H) 914 (H) 238 (H)   Diabetes history: DM 2 Outpatient Diabetes medications: None Current orders for Inpatient glycemic control:  Novolog 0-6 units tid A1c 6.1% on 1/6  PO Prednisone 40 mg Daily  Inpatient Diabetes Program Recommendations:    Glucose trends increase after steroid dose and meals  -   Add Novolog 2 units tid meal coverage if eating >50% of meals  Thanks, Christena Deem RN, MSN, BC-ADM Inpatient Diabetes Coordinator Team Pager [phone] (8a-5p)

## 2023-04-09 NOTE — Discharge Summary (Signed)
Name: [name] MRN: [id number] DOB: September 24, 1970 53 y.o. PCP: Manuela Neptune, MD  Date of Admission: 04/07/2023  9:47 AM Date of Discharge: 04/09/2023 04/09/2023 Attending Physician: Dr.  Lafonda Mosses  DISCHARGE DIAGNOSIS:  Primary Problem: CAP (community acquired pneumonia)   Hospital Problems: Principal Problem:   CAP (community acquired pneumonia) Active Problems:   Pneumonia due to infectious organism   Acute hypoxic respiratory failure ([name])   COPD exacerbation ([name])    DISCHARGE MEDICATIONS:   Allergies as of 04/09/2023       Reactions   Metformin And Related Rash        Medication List     PAUSE taking these medications    losartan 25 MG tablet Wait to take this until your doctor or other care provider tells you to start again. Commonly known as: Cozaar Take 1 tablet (25 mg total) by mouth daily.       STOP taking these medications    Anoro Ellipta 62.5-25 MCG/ACT Aepb Generic drug: umeclidinium-vilanterol   Qvar RediHaler 80 MCG/ACT inhaler Generic drug: beclomethasone       TAKE these medications    albuterol 108 (90 Base) MCG/ACT inhaler Commonly known as: VENTOLIN HFA Inhale 2 puffs into the lungs every 6 (six) hours as needed for wheezing or shortness of breath.   amoxicillin-clavulanate 875-125 MG tablet Commonly known as: AUGMENTIN Take 1 tablet by mouth 2 (two) times daily for 2 days.   Breztri Aerosphere 160-9-4.8 MCG/ACT Aero Generic drug: Budeson-Glycopyrrol-Formoterol Inhale 1 puff into the lungs 2 (two) times daily.   predniSONE 20 MG tablet Commonly known as: DELTASONE Take 2 tablets (40 mg total) by mouth daily for 2 days. Start taking on: April 10, 2023   rosuvastatin 20 MG tablet Commonly known as: Crestor Take 1 tablet (20 mg total) by mouth daily.   sulfamethoxazole-trimethoprim 800-160 MG tablet Commonly known as: BACTRIM DS Take 1 tablet by mouth daily.   Symtuza 800-150-200-10 MG Tabs Generic  drug: Darunavir-Cobicistat-Emtricitabine-Tenofovir Alafenamide Take 1 tablet by mouth daily with breakfast.   Tivicay 50 MG tablet Generic drug: dolutegravir Take 1 tablet (50 mg total) by mouth daily.   triamcinolone ointment 0.1 % Commonly known as: KENALOG Apply 1 Application topically 2 (two) times daily. What changed:  when to take this reasons to take this               Durable Medical Equipment  (From admission, onward)           Start     Ordered   04/09/23 0921  For home use only DME oxygen  Once       Question Answer Comment  Length of Need 6 Months   Mode or (Route) Nasal cannula   Oxygen delivery system Gas      04/09/23 0920            DISPOSITION AND FOLLOW-UP:  Mr.[name] P Ogawa was discharged from [facility] in stable condition. At the hospital follow up visit please address: Presumed COPD exacerbation 2/2 CAP: Never completed formal PFTs. Discharged with 2 days of augmentin and oral prednisone. Ensure improvement of cough and dyspnea back to baseline.  Oxygen requirement: Patient previously had home O2 but sent it back due to cost, discharged with home O2 2L. Hypertension: Held home losartan given stable blood pressure inpatient. Revaluate potentially restarting outpatient.  Elevated creatinine: Cr 1.4 at time of discharge. Encouraged PO intake. Please repeat BMP  Follow-up Recommendations: Consults: none Labs: Basic  Metabolic Profile and CBC Studies: Ambulating oxygen, PFTs Medications:  - Held home losartan  - 2 days of augmentin and oral prednisone - Restarted home rosuvastatin - Stopped ellipta and qvar inhalers - Started Breztri inhaler 1 puff BID  Follow-up Appointments:  Follow-up Information     Blanchard, [name], MD Follow up in 1 week(s).   Specialty: Internal Medicine Why: Hospital follow up Contact information: [address] [place] [address] [phone]               -  February 5th 2025 at 9:45 am with Dr. London Sheer COURSE:  Patient Summary: Patient Summary [name] is a 53 y.o. person living with a history of presumed COPD/asthma (no formal PFT completed), advanced HIV (viral load <20, CD4 87 in 02/2023), pneumocystis pneumonia, and previously treated tuberculosis who presented with worsening dyspnea, cough, and chest pain and admitted for community acquired pneumonia.   # Presumed COPD exacerbation 2/2 Community acquired pneumonia  On presentation, patient had cough with increased sputum production. He was afebrile, required 2 L of supplemental oxygen, had leukocytosis and chest x-ray with opacity concerning for community-acquired pneumonia.  Was treated for community-acquired pneumonia with ceftriaxone and azithromycin, and COPD with 5-day course of prednisone.  He worked with mobility specialist, required 2 L of supplemental oxygen with ambulation.  Will discharge home with home oxygen. Patient will need to complete 2 more days of prednisone 40 mg and Augmentin.  - Follow-up with IMTS in clinic. - Discharged with 2 L of home oxygen. - Augmentin 875-125 mg for 2 days, last day 1/26 - refilled home Anoro Ellipta and Qvar inhalers - Prednisone 40 mg (day 3/5), last day 1/26    - Continue home prophylactic Bactrim DS 800-160 mg tablet daily  #Elevated creatinine SCr 1.43, baseline Cr 1-1.2. Most likely due to poor PO intake in setting of CAP and bothersome cough. Encouraged increased PO intake.  Holding losartan due to elevated SCr. - repeat BMP outpatient   Hyperkalemia K5.5, EKG with no changes.  Repeat K4.4, initial potassium due to hemolyzed blood. - Monitor  #Type 2 diabetes mellitus  Diet controlled, recent A1c 6.1.  Mildly elevated blood glucose, likely the setting of prednisone therapy.   #Advanced HIV Follows with Dr. Drue Second from ID outpatient and patient is adherent to his ART. Most recent viral load <20 and CD4 87  in 02/2023. Currently on Symtuza and Tivicay. Continued home medications during hospitalization.  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 Less likely that current symptoms are from CHF exacerbation. Normotensive on arrival. Held home losartan at time if discharge.   Hyperlipidemia - Resumed home rosuvastatin 20 mg.   DISCHARGE INSTRUCTIONS:   Discharge Instructions     Call MD for:  difficulty breathing, headache or visual disturbances   Complete by: As directed    Diet - low sodium heart healthy   Complete by: As directed    Discharge instructions   Complete by: As directed    - Follow up with PCP.   Increase activity slowly   Complete by: As directed      Mr. Mayra, Brahm came to the hospital due to cough, shortness of breath, and chest pain. You were found to have pneumonia and were admitted because you needed  oxygen. Your symptoms improved in the hospital and you were discharged home. You will continue your antibiotic treatment at home over the next several days.   These are your medication changes: Stop taking these medications  Losartan 25 mg daily  Ellipta inhaler 1 puff daily   Qvar inhaler 1 puff twice a day Start taking these medications   Augmentin 875-125 mg daily for 2 days  Prednisone 40 mg daily for 2 days Rosuvastatin 20 mg daily  Breztri inhaler 1 puff1 twice a day  Please continue all of your other medications as previously prescribed.   You will need to follow up with our practice for a post hospitalization appointment in the [facility] Internal Medicine clinic on February 5th 2025 at 9:45 am with Dr. Colbert Coyer. If your symptoms worsen you can call the clinic or return to the emergency department for further evaluation.  It has been a pleasure taking care of  you, [name], MS3 [employer] [facility] of Medicine Attestation for Student Documentation:  I personally was present and re-performed the history, physical exam and medical decision-making activities of this service and have verified that the service and findings are accurately documented in the student's note.  Laretta Bolster, MD 04/09/2023, 11:30 AM   SUBJECTIVE:  Patient was seen resting comfortably in bed on room air. Patient reports feeling better with his cough improving. Oxygen desaturated to 85% while walking with nurse earlier this morning. Patient declines dyspnea or light headedness.   Discharge Vitals:   BP 115/80 (BP Location: Right Arm)   Pulse 83   Temp 98.7 F (37.1 C) (Oral)   Resp 20   SpO2 92%   OBJECTIVE:  Physical Exam Constitutional:      Appearance: He is not ill-appearing.  Cardiovascular:     Rate and Rhythm: Normal rate and regular rhythm.     Heart sounds: Normal heart sounds.  Pulmonary:     Effort: Pulmonary effort is normal.     Breath sounds: Normal breath sounds.  Musculoskeletal:     Right lower leg: No edema.     Left lower leg: No edema.  Skin:    General: Skin is warm and dry.  Neurological:     Mental Status: He is alert.      Pertinent Labs, Studies, and Procedures:     Latest Ref Rng & Units 04/09/2023    5:30 AM 04/08/2023    5:21 AM 04/07/2023   12:12 PM  CBC  WBC 4.0 - 10.5 K/uL 15.4  13.8    Hemoglobin 13.0 - 17.0 g/dL 28.4  13.2  44.0   Hematocrit 39.0 - 52.0 % 40.9  41.0  43.0   Platelets 150 - 400 K/uL 272  222         Latest Ref Rng & Units 04/09/2023   10:08 AM 04/09/2023    5:30 AM 04/08/2023    5:21 AM  CMP  Glucose 70 - 99 mg/dL  102  725   BUN 6 - 20 mg/dL  42  33   Creatinine 3.66 - 1.24 mg/dL  4.40  3.47   Sodium 425 - 145 mmol/L  139  130   Potassium 3.5 - 5.1 mmol/L 4.4  5.5  5.0   Chloride 98 - 111 mmol/L  101  92   CO2 22 - 32 mmol/L  29  28   Calcium 8.9 - 10.3 mg/dL  9.8  9.4      DG Chest [facility]  1 View Result Date: 04/07/2023 CLINICAL DATA:  Dyspnea. EXAM: PORTABLE CHEST 1 VIEW COMPARISON:  01/18/2023 FINDINGS: Lungs are hyperexpanded. Biapical pleuroparenchymal scarring is similar to prior. Interval progression of patchy and nodular airspace opacity in the left base, superimposed on a background of chronic interstitial coarsening. Cardiopericardial silhouette is at upper limits of normal for size. No pneumothorax. Small left pleural effusion. IMPRESSION: Interval progression of patchy and nodular airspace opacity in the left base with small left pleural effusion. Imaging features compatible with pneumonia. Close follow-up recommended to ensure resolution. Electronically Signed   By: Kennith Center M.D.   On: 04/07/2023 12:02     Signed: Ezekiel Parsons, MS3 Pager: [phone] 4:02 PM, 04/09/2023  Attestation for Student Documentation:  I personally was present and re-performed the history, physical exam and medical decision-making activities of this service and have verified that the service and findings are accurately documented in the student's note.  Laretta Bolster, MD 04/09/2023, 4:02 PM

## 2023-04-09 NOTE — Progress Notes (Addendum)
SATURATION QUALIFICATIONS: (This note is used to comply with regulatory documentation for home oxygen)  Patient Saturations on Room Air at Rest = 94%  Patient Saturations on Room Air while Ambulating = 85%  Patient Saturations on 2 Liters of oxygen while Ambulating = 92%  Please briefly explain why patient needs home oxygen: O2 Saturation dropped to 85%while walking. Patient will need 2 liter oxygen.

## 2023-04-21 ENCOUNTER — Telehealth: Payer: Self-pay

## 2023-04-21 ENCOUNTER — Ambulatory Visit: Payer: 59 | Admitting: Student

## 2023-04-21 VITALS — BP 119/84 | HR 92 | Ht 73.0 in | Wt 184.6 lb

## 2023-04-21 DIAGNOSIS — E1159 Type 2 diabetes mellitus with other circulatory complications: Secondary | ICD-10-CM

## 2023-04-21 DIAGNOSIS — J449 Chronic obstructive pulmonary disease, unspecified: Secondary | ICD-10-CM | POA: Diagnosis not present

## 2023-04-21 DIAGNOSIS — I152 Hypertension secondary to endocrine disorders: Secondary | ICD-10-CM

## 2023-04-21 DIAGNOSIS — B20 Human immunodeficiency virus [HIV] disease: Secondary | ICD-10-CM

## 2023-04-21 DIAGNOSIS — J439 Emphysema, unspecified: Secondary | ICD-10-CM | POA: Diagnosis not present

## 2023-04-21 MED ORDER — ALBUTEROL SULFATE HFA 108 (90 BASE) MCG/ACT IN AERS: 2.0000 | INHALATION_SPRAY | Freq: Four times a day (QID) | RESPIRATORY_TRACT | 2 refills | Status: DC | PRN

## 2023-04-21 MED ORDER — BREZTRI AEROSPHERE 160-9-4.8 MCG/ACT IN AERO: 1.0000 | INHALATION_SPRAY | Freq: Two times a day (BID) | RESPIRATORY_TRACT | 0 refills | Status: DC

## 2023-04-21 NOTE — Telephone Encounter (Signed)
Medication was picked up at [facility] on  04/21/23 @ 11:00am

## 2023-04-21 NOTE — Progress Notes (Signed)
 Established Patient Office Visit  Subjective   Patient ID: [name], male    DOB: 1970/10/22  Age: 53 y.o. MRN: [id number]  Chief Complaint  Patient presents with   Hospitalization Follow-up    Patient is a 53 y.o. with a past medical history stated below who presents today for follow-up for a recent hospitalization. Patient presented to [facility] ED with acute cough and dyspnea and was admitted by the Internal Medicine Teaching Program fro CAP and COPD from 1/22-1/24. Tablet [name] interpreter used for the entirety of the visit.   Please see problem based assessment and plan for additional details.     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  Respiratory:  Negative for cough, shortness of breath and wheezing.   Cardiovascular:  Negative for chest pain and leg swelling.  Gastrointestinal:  Negative for abdominal pain, nausea and vomiting.  Skin:  Negative for rash.  Neurological:  Negative  for weakness.     Objective:     BP 119/84 (BP Location: Right Arm, Patient Position: Sitting, Cuff Size: Normal)   Pulse 92   Ht 6' 1 (1.854 m)   Wt 184 lb 9.6 oz (83.7 kg)   SpO2 95%   BMI 24.36 kg/m  BP Readings from Last 3 Encounters:  04/21/23 119/84  04/09/23 115/80  03/22/23 135/85   Wt Readings from Last 3 Encounters:  04/21/23 184 lb 9.6 oz (83.7 kg)  03/22/23 187 lb 12.8 oz (85.2 kg)  02/15/23 195 lb (88.5 kg)      Physical Exam HENT:     Head: Normocephalic and atraumatic.  Cardiovascular:     Rate and Rhythm: Normal rate and regular rhythm.     Heart sounds: Normal heart sounds.  Pulmonary:     Effort: Pulmonary effort is normal.     Breath sounds: Normal breath sounds.  Abdominal:     General: Bowel sounds are normal.     Palpations: Abdomen is soft.  Musculoskeletal:        General: Normal range of motion.  Skin:    General: Skin is warm and dry.  Neurological:     General: No focal deficit present.     Mental Status: He is alert.  Psychiatric:        Mood and Affect: Mood normal.        Behavior: Behavior normal.      Results for orders placed or performed in visit on 04/21/23  BMP8+Anion Gap  Result Value Ref Range   Glucose 146 (H) 70 - 99 mg/dL   BUN 16 6 - 24 mg/dL   Creatinine, Ser 8.87 0.76 - 1.27 mg/dL   eGFR 79 >40 fO/fpw/8.26   BUN/Creatinine Ratio 14 9 - 20   Sodium 139 134 - 144 mmol/L   Potassium 4.0 3.5 - 5.2 mmol/L   Chloride 101 96 - 106 mmol/L   CO2 25 20 - 29 mmol/L   Anion Gap 13.0 10.0 - 18.0 mmol/L   Calcium  9.4 8.7 - 10.2 mg/dL    Last metabolic panel Lab Results  Component Value Date   GLUCOSE 146 (H) 04/21/2023   NA 139 04/21/2023   K 4.0 04/21/2023   CL 101 04/21/2023   CO2 25 04/21/2023   BUN 16 04/21/2023   CREATININE 1.12 04/21/2023   EGFR 79 04/21/2023   CALCIUM  9.4 04/21/2023   PHOS 3.3 11/04/2020   PROT 7.8 04/07/2023   ALBUMIN 2.7 (L) 04/07/2023   LABGLOB 6.0 (H) 11/02/2020   AGRATIO 0.4  (L) 11/02/2020   BILITOT 0.5 04/07/2023   ALKPHOS 73 04/07/2023   AST 14 (L) 04/07/2023   ALT 16 04/07/2023   ANIONGAP 9 04/09/2023   The 10-year ASCVD risk score (Arnett DK, et al., 2019) is: 7.6%    Assessment & Plan:   Problem List Items Addressed This Visit     HIV disease ([name])   Patient follows with Dr. Luiz for ID clinic, adherent to ART. Takes Symtuza  and Tivicay , tolerating well. No concerns today.  Plan - Continue Symtuza  daily and Tivicay  daily  - Continue regular follow up visits with Dr. Luiz      COPD (chronic obstructive pulmonary disease) with emphysema ([name])   Patient was treated for COPD exacerbation due to CAP with antibiotic therapy, prednisone , and inhalers. He was discharged home on 2L O2 via [place], 2 days of Augmentin  875-125 mg to finish course, 2 days of Prednisone  40 mg to finish 5 day course, home prophylactic Bactrim  800-160 mg tablet daily, albuterol  inhaler, and Breztri  inhaler. Patient states he has been feeling tired but well overall and plans on returning to work next week. Completed the Augmentin  and Prednisone  doses and is taking Bactrim  consistently. Requested refills for his inhalers. He is using his O2 oxygen at home as needed, mostly only when he feels short of breath which is only sometimes.   Here patient's vitals are stable, O2 at rest is 95% on room air. Ambulatory pulse ox shows patient desaturating to low 90s with ambulation and recovering with rest. On exam, patient with clear lungs bilaterally on auscultation, no coughing or wheezing observed. Discussed getting a pulse ox so he can determine when and how often he is feeling short of breath and needs his supplemental O2, especially as he is planning to return to work. Patient endorsed understanding.  Plan - Continue Bactrim  DS 800-160 mg daily - Continue use of albuterol  and Breztri  inhalers, refilled today - Obtain pulse ox if possible, continue use of O2 at home as needed - Provided with  strict return precautions should patient feel fever, shortness of breath, or productive cough      Relevant Medications   albuterol  (VENTOLIN  HFA) 108 (90 Base) MCG/ACT inhaler   Budeson-Glycopyrrol-Formoterol  (BREZTRI  AEROSPHERE) 160-9-4.8 MCG/ACT AERO   Hypertension associated with diabetes ([name]) - Primary   Patient's blood pressure today 119/84. Denies chest pain, shortness of breath, or swelling. CV exam unremarkable, no lower extremity swelling observed. Previously  on losartan  25 mg daily, this was paused during patient's hospitalization due to lower BP, AKI, and electrolyte abnormalities. BMP today unremarkable, with serum Cr 1.12 and electrolytes WNL.  Plan - Given BMP results, will discuss restarting losartan  25 mg daily with patient, given normotensive BP in clinic today without medication. May need to hold off on restarting until follow up visit in 6-8 weeks.       Relevant Orders   BMP8+Anion Gap (Completed)   Other Visit Diagnoses       Chronic obstructive pulmonary disease, unspecified COPD type ([name])       Relevant Medications   albuterol  (VENTOLIN  HFA) 108 (90 Base) MCG/ACT inhaler   Budeson-Glycopyrrol-Formoterol  (BREZTRI  AEROSPHERE) 160-9-4.8 MCG/ACT AERO      Patient discussed with Dr. Karna.  Return 6-8 weeks, for CHF, HTN, suspected COPD follow up .   Magda Muise Arellano Zameza, MD

## 2023-04-21 NOTE — Patient Instructions (Signed)
 Thank you, [name] for allowing us  to provide your care today. Today we discussed your recent hospitalization.   I have ordered the following labs for you:   Lab Orders         BMP8+Anion Gap      Tests ordered today:  None  Referrals ordered today:   Referral Orders  No referral(s) requested today     I have ordered the following medication/changed the following medications:   Stop the following medications: Medications Discontinued During This Encounter  Medication Reason   albuterol  (VENTOLIN  HFA) 108 (90 Base) MCG/ACT inhaler Reorder   Budeson-Glycopyrrol-Formoterol  (BREZTRI  AEROSPHERE) 160-9-4.8 MCG/ACT AERO Reorder     Start the following medications: Meds ordered this encounter  Medications   albuterol  (VENTOLIN  HFA) 108 (90 Base) MCG/ACT inhaler    Sig: Inhale 2 puffs into the lungs every 6 (six) hours as needed for wheezing or shortness of breath.    Dispense:  6.7 g    Refill:  2   Budeson-Glycopyrrol-Formoterol  (BREZTRI  AEROSPHERE) 160-9-4.8 MCG/ACT AERO    Sig: Inhale 1 puff into the lungs 2 (two) times daily.    Dispense:  10.7 g    Refill:  0    Switch from Anoro and Qvar  to Breztri  1 puff BID per secure chat     Follow up: 6-8 weeks for CHF, HTN, suspected COPD follow up    Remember:   - Please pick up your inhalers from the pharmacy and continue to use as instructed.   - Please call our clinic or go to urgent care/emergency room if you have a new fever, sudden shortness of breath, chest pain, or increased cough that does not go away.   - I will call you with the results of your lab today to let you know if you can resume Losartan . Continue to hold this medication for now.   - We will see you in 6-8 weeks to follow up on your chronic medical concerns, please bring all your medications with you.   Should you have any questions or concerns please call the internal medicine clinic at [phone].     [name],  MD PGY-1 Internal Medicine Teaching Progam [facility]

## 2023-04-22 LAB — BMP8+ANION GAP
Anion Gap: 13 mmol/L (ref 10.0–18.0)
BUN/Creatinine Ratio: 14 (ref 9–20)
BUN: 16 mg/dL (ref 6–24)
CO2: 25 mmol/L (ref 20–29)
Calcium: 9.4 mg/dL (ref 8.7–10.2)
Chloride: 101 mmol/L (ref 96–106)
Creatinine, Ser: 1.12 mg/dL (ref 0.76–1.27)
Glucose: 146 mg/dL — ABNORMAL HIGH (ref 70–99)
Potassium: 4 mmol/L (ref 3.5–5.2)
Sodium: 139 mmol/L (ref 134–144)
eGFR: 79 mL/min/{1.73_m2} (ref >59)

## 2023-04-23 NOTE — Assessment & Plan Note (Signed)
 Patient's blood pressure today 119/84. Denies chest pain, shortness of breath, or swelling. CV exam unremarkable, no lower extremity swelling observed. Previously on losartan  25 mg daily, this was paused during patient's hospitalization due to lower BP, AKI, and electrolyte abnormalities. BMP today unremarkable, with serum Cr 1.12 and electrolytes WNL.  Plan - Given BMP results, will discuss restarting losartan  25 mg daily with patient, given normotensive BP in clinic today without medication. May need to hold off on restarting until follow up visit in 6-8 weeks.

## 2023-04-23 NOTE — Assessment & Plan Note (Signed)
 Patient follows with Dr. Levern Reader for ID clinic, adherent to ART. Takes Symtuza  and Tivicay , tolerating well. No concerns today.  Plan - Continue Symtuza  daily and Tivicay  daily  - Continue regular follow up visits with Dr. Levern Reader

## 2023-04-23 NOTE — Assessment & Plan Note (Signed)
 Patient was treated for COPD exacerbation due to CAP with antibiotic therapy, prednisone , and inhalers. He was discharged home on 2L O2 via [place], 2 days of Augmentin  875-125 mg to finish course, 2 days of Prednisone  40 mg to finish 5 day course, home prophylactic Bactrim  800-160 mg tablet daily, albuterol  inhaler, and Breztri  inhaler. Patient states he has been feeling tired but well overall and plans on returning to work next week. Completed the Augmentin  and Prednisone  doses and is taking Bactrim  consistently. Requested refills for his inhalers. He is using his O2 oxygen at home as needed, mostly only when he feels short of breath which is only sometimes.   Here patient's vitals are stable, O2 at rest is 95% on room air. Ambulatory pulse ox shows patient desaturating to low 90s with ambulation and recovering with rest. On exam, patient with clear lungs bilaterally on auscultation, no coughing or wheezing observed. Discussed getting a pulse ox so he can determine when and how often he is feeling short of breath and needs his supplemental O2, especially as he is planning to return to work. Patient endorsed understanding.  Plan - Continue Bactrim  DS 800-160 mg daily - Continue use of albuterol  and Breztri  inhalers, refilled today - Obtain pulse ox if possible, continue use of O2 at home as needed - Provided with strict return precautions should patient feel fever, shortness of breath, or productive cough

## 2023-04-28 NOTE — Addendum Note (Signed)
Addended by: Dickie La on: 04/28/2023 10:14 AM   Modules accepted: Level of Service

## 2023-04-28 NOTE — Progress Notes (Signed)
 Internal Medicine Clinic Attending  Case discussed with the resident at the time of the visit.  We reviewed the resident's history and exam and pertinent patient test results.  I agree with the assessment, diagnosis, and plan of care documented in the resident's note.

## 2023-05-13 ENCOUNTER — Other Ambulatory Visit: Payer: Self-pay

## 2023-05-13 ENCOUNTER — Other Ambulatory Visit (HOSPITAL_COMMUNITY): Payer: Self-pay

## 2023-05-13 NOTE — Progress Notes (Signed)
 Specialty Pharmacy Refill Coordination Note  DILON LANK is a 53 y.o. male assessed today regarding refills of clinic administered specialty medication(s) Darun-Cobic-Emtricit-TenofAF (Symtuza); Dolutegravir Sodium (Tivicay)   Clinic requested Courier to Provider Office   Delivery date: 05/18/23   Verified address: [address] [place] [address]   Medication will be filled on 05/17/23.

## 2023-05-17 ENCOUNTER — Ambulatory Visit: Payer: Managed Care, Other (non HMO) | Admitting: Internal Medicine

## 2023-05-17 ENCOUNTER — Other Ambulatory Visit (HOSPITAL_COMMUNITY): Payer: Self-pay

## 2023-05-17 ENCOUNTER — Other Ambulatory Visit: Payer: Self-pay

## 2023-05-18 ENCOUNTER — Other Ambulatory Visit (HOSPITAL_COMMUNITY): Payer: Self-pay

## 2023-05-18 ENCOUNTER — Other Ambulatory Visit: Payer: Self-pay

## 2023-05-18 ENCOUNTER — Telehealth: Payer: Self-pay

## 2023-05-18 DIAGNOSIS — B20 Human immunodeficiency virus [HIV] disease: Secondary | ICD-10-CM

## 2023-05-18 MED ORDER — SYMTUZA 800-150-200-10 MG PO TABS: 1.0000 | ORAL_TABLET | Freq: Every day | ORAL | 5 refills | Status: DC

## 2023-05-18 MED ORDER — TIVICAY 50 MG PO TABS: 50.0000 mg | ORAL_TABLET | Freq: Every day | ORAL | 5 refills | Status: DC

## 2023-05-18 MED ORDER — SULFAMETHOXAZOLE-TRIMETHOPRIM 800-160 MG PO TABS: 1.0000 | ORAL_TABLET | Freq: Every day | ORAL | 5 refills | Status: DC

## 2023-05-18 NOTE — Telephone Encounter (Signed)
-----   Message from Bobette Mo sent at 05/18/2023  9:38 AM EST ----- Regarding: Symtuza & Tivicay Hey Ce Ce,  He have new insurance and will need to [facility] , if you can send his scripts to [facility] please.  Thank You,  Clearance Coots, CPhT Specialty Pharmacy Patient [facility] Phone: [phone] Fax: [phone]

## 2023-05-18 NOTE — Telephone Encounter (Signed)
 Refills [facility] to [facility] on [address]. Juanita Laster, RMA

## 2023-05-18 NOTE — Progress Notes (Signed)
 Patient has new insurance and has to use [employer]

## 2023-05-19 ENCOUNTER — Telehealth: Payer: Self-pay

## 2023-05-19 ENCOUNTER — Ambulatory Visit: Admitting: Internal Medicine

## 2023-05-19 ENCOUNTER — Other Ambulatory Visit (HOSPITAL_COMMUNITY): Payer: Self-pay

## 2023-05-19 ENCOUNTER — Other Ambulatory Visit: Payer: Self-pay

## 2023-05-19 ENCOUNTER — Encounter: Payer: Self-pay | Admitting: Internal Medicine

## 2023-05-19 VITALS — BP 101/74 | HR 106 | Temp 98.1°F | Ht 68.5 in | Wt 187.0 lb

## 2023-05-19 DIAGNOSIS — J449 Chronic obstructive pulmonary disease, unspecified: Secondary | ICD-10-CM

## 2023-05-19 DIAGNOSIS — B20 Human immunodeficiency virus [HIV] disease: Secondary | ICD-10-CM

## 2023-05-19 DIAGNOSIS — Z79899 Other long term (current) drug therapy: Secondary | ICD-10-CM

## 2023-05-19 NOTE — Progress Notes (Signed)
 Patient ID: [name], male   DOB: [birth year], 53 y.o.   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 Now improving. Still some coughing at night. No DOE. Doing well. Improving his appetite. Not missing doses of his medications  Here with translator  Outpatient Encounter Medications as of 05/19/2023  Medication Sig   albuterol (VENTOLIN HFA) 108 (90 Base) MCG/ACT inhaler Inhale 2 puffs into the lungs every 6 (six) hours as needed for wheezing or shortness of breath.   Budeson-Glycopyrrol-Formoterol (BREZTRI AEROSPHERE) 160-9-4.8 MCG/ACT AERO Inhale 1 puff into the lungs 2 (two) times daily.   Darunavir-Cobicistat-Emtricitabine-Tenofovir Alafenamide (SYMTUZA) 800-150-200-10 MG TABS Take 1 tablet by mouth daily with breakfast.   dolutegravir (TIVICAY) 50 MG tablet Take 1 tablet (50 mg total) by mouth daily.   [Paused] losartan (COZAAR) 25 MG tablet Take 1 tablet (25 mg total) by mouth daily.   rosuvastatin (CRESTOR) 20 MG tablet Take 1 tablet (20 mg total) by mouth daily.   sulfamethoxazole-trimethoprim (BACTRIM DS) 800-160 MG tablet Take 1 tablet by mouth daily.   triamcinolone ointment (KENALOG) 0.1 % Apply 1 Application topically 2 (two) times daily. (Patient taking differently: Apply 1 Application topically 2 (two) times daily as needed (rash).)   No facility-administered encounter medications on file as of 05/19/2023.     Patient Active Problem List   Diagnosis Date Noted   COPD exacerbation ([name]) 04/09/2023   Pneumonia due to infectious organism 04/08/2023   Acute hypoxic respiratory failure ([name]) 04/08/2023   CAP (community acquired pneumonia) 04/07/2023   Hypertension associated with diabetes ([name]) 03/22/2023   Health care maintenance 03/22/2023   Adenopathy 12/07/2021   Atopic dermatitis  09/30/2021   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   S/P colostomy ([name]) 02/06/2021   Type 2 diabetes mellitus ([name]) 05/17/2019   Genital warts 05/06/2018   Pulmonary emphysema ([name])    Condyloma 01/04/2017   Chronic systolic CHF (congestive heart failure) ([name]) 04/24/2016   AIDS (acquired immune deficiency syndrome) ([name])    HIV disease ([name]) 09/18/2013     Health Maintenance Due  Topic Date Due   OPHTHALMOLOGY EXAM  Never done   Zoster Vaccines- Shingrix (1 of 2) Never done   COVID-19 Vaccine (3 - Pfizer risk series) 03/15/2023     Review of Systems  Physical Exam   Ht 5' 8.5" (1.74 m)   Wt 187 lb (84.8 kg)   BMI 28.02 kg/m    Lab Results  Component Value Date   CD4TCELL 10 (L) 02/15/2023   Lab Results  Component Value Date   CD4TABS 87 (L) 02/15/2023   CD4TABS 93 (L) 06/23/2022   CD4TABS 75 (L) 03/17/2022   Lab Results  Component Value Date   HIV1RNAQUANT <20 (H) 02/15/2023   No results found for: "HEPBSAB" Lab Results  Component Value Date   LABRPR NON-REACTIVE 02/15/2023    CBC Lab Results  Component Value Date   WBC 15.4 (H) 04/09/2023   RBC 4.15 (L) 04/09/2023   HGB 13.7 04/09/2023   HCT 40.9 04/09/2023   PLT 272 04/09/2023   MCV 98.6 04/09/2023   MCH 33.0 04/09/2023   MCHC 33.5 04/09/2023   RDW 12.2 04/09/2023   LYMPHSABS 0.4 (L) 04/07/2023   MONOABS 0.4 04/07/2023   EOSABS 0.0 04/07/2023  BMET Lab Results  Component Value Date   NA 139 04/21/2023   K 4.0 04/21/2023   CL 101 04/21/2023   CO2 25 04/21/2023   GLUCOSE 146 (H) 04/21/2023   BUN 16 04/21/2023   CREATININE 1.12 04/21/2023   CALCIUM 9.4 04/21/2023   GFRNONAA 59 (L) 04/09/2023   GFRAA 79 12/04/2019      Assessment and Plan Hiv disease = plan to check hiv labs Will continue with tivicay and symtuza  Advanced hiv < cd 4 count 200 = continue on bactrim ds daily  Long term medicaiton management = cr is stable on most recent  labs  COPD = continue on albuterol as needed

## 2023-05-19 NOTE — Telephone Encounter (Signed)
 Medication was picked up at [facility] on  05/19/23 @ 10:50am

## 2023-05-22 LAB — CBC WITH DIFFERENTIAL/PLATELET
Absolute Lymphocytes: 1420 {cells}/uL (ref 850–3900)
Absolute Monocytes: 504 {cells}/uL (ref 200–950)
Basophils Absolute: 21 {cells}/uL (ref 0–200)
Basophils Relative: 0.5 %
Eosinophils Absolute: 281 {cells}/uL (ref 15–500)
Eosinophils Relative: 6.7 %
HCT: 44.1 % (ref 38.5–50.0)
Hemoglobin: 14.7 g/dL (ref 13.2–17.1)
MCH: 33.5 pg — ABNORMAL HIGH (ref 27.0–33.0)
MCHC: 33.3 g/dL (ref 32.0–36.0)
MCV: 100.5 fL — ABNORMAL HIGH (ref 80.0–100.0)
MPV: 10.2 fL (ref 7.5–12.5)
Monocytes Relative: 12 %
Neutro Abs: 1974 {cells}/uL (ref 1500–7800)
Neutrophils Relative %: 47 %
Platelets: 196 10*3/uL (ref 140–400)
RBC: 4.39 10*6/uL (ref 4.20–5.80)
RDW: 12.5 % (ref 11.0–15.0)
Total Lymphocyte: 33.8 %
WBC: 4.2 10*3/uL (ref 3.8–10.8)

## 2023-05-22 LAB — HIV-1 RNA QUANT-NO REFLEX-BLD
HIV 1 RNA Quant: 29 {copies}/mL — ABNORMAL HIGH
HIV-1 RNA Quant, Log: 1.47 {Log_copies}/mL — ABNORMAL HIGH

## 2023-05-27 ENCOUNTER — Other Ambulatory Visit (HOSPITAL_COMMUNITY): Payer: Self-pay

## 2023-06-04 ENCOUNTER — Emergency Department (HOSPITAL_COMMUNITY)

## 2023-06-04 ENCOUNTER — Other Ambulatory Visit: Payer: Self-pay

## 2023-06-04 ENCOUNTER — Encounter (HOSPITAL_COMMUNITY): Payer: Self-pay

## 2023-06-04 ENCOUNTER — Inpatient Hospital Stay (HOSPITAL_COMMUNITY)
Admission: EM | Admit: 2023-06-04 | Discharge: 2023-06-05 | DRG: 974 | Disposition: A | Discharge status: Home / Self Care | Attending: Infectious Diseases | Admitting: Infectious Diseases

## 2023-06-04 DIAGNOSIS — J189 Pneumonia, unspecified organism: Secondary | ICD-10-CM | POA: Diagnosis present

## 2023-06-04 DIAGNOSIS — Z7951 Long term (current) use of inhaled steroids: Secondary | ICD-10-CM

## 2023-06-04 DIAGNOSIS — J449 Chronic obstructive pulmonary disease, unspecified: Secondary | ICD-10-CM | POA: Diagnosis present

## 2023-06-04 DIAGNOSIS — T380X5A Adverse effect of glucocorticoids and synthetic analogues, initial encounter: Secondary | ICD-10-CM | POA: Diagnosis present

## 2023-06-04 DIAGNOSIS — E785 Hyperlipidemia, unspecified: Secondary | ICD-10-CM | POA: Diagnosis present

## 2023-06-04 DIAGNOSIS — J479 Bronchiectasis, uncomplicated: Secondary | ICD-10-CM

## 2023-06-04 DIAGNOSIS — B20 Human immunodeficiency virus [HIV] disease: Secondary | ICD-10-CM | POA: Diagnosis present

## 2023-06-04 DIAGNOSIS — J470 Bronchiectasis with acute lower respiratory infection: Secondary | ICD-10-CM | POA: Diagnosis present

## 2023-06-04 DIAGNOSIS — Z8611 Personal history of tuberculosis: Secondary | ICD-10-CM | POA: Diagnosis not present

## 2023-06-04 DIAGNOSIS — Z888 Allergy status to other drugs, medicaments and biological substances status: Secondary | ICD-10-CM | POA: Diagnosis not present

## 2023-06-04 DIAGNOSIS — Z86711 Personal history of pulmonary embolism: Secondary | ICD-10-CM

## 2023-06-04 DIAGNOSIS — I5032 Chronic diastolic (congestive) heart failure: Secondary | ICD-10-CM | POA: Diagnosis present

## 2023-06-04 DIAGNOSIS — J439 Emphysema, unspecified: Secondary | ICD-10-CM | POA: Diagnosis present

## 2023-06-04 DIAGNOSIS — Z79899 Other long term (current) drug therapy: Secondary | ICD-10-CM | POA: Diagnosis not present

## 2023-06-04 DIAGNOSIS — J9621 Acute and chronic respiratory failure with hypoxia: Secondary | ICD-10-CM | POA: Diagnosis not present

## 2023-06-04 DIAGNOSIS — E1165 Type 2 diabetes mellitus with hyperglycemia: Secondary | ICD-10-CM | POA: Diagnosis present

## 2023-06-04 DIAGNOSIS — J9622 Acute and chronic respiratory failure with hypercapnia: Secondary | ICD-10-CM | POA: Diagnosis present

## 2023-06-04 DIAGNOSIS — J440 Chronic obstructive pulmonary disease with acute lower respiratory infection: Secondary | ICD-10-CM | POA: Diagnosis present

## 2023-06-04 DIAGNOSIS — Z85038 Personal history of other malignant neoplasm of large intestine: Secondary | ICD-10-CM | POA: Diagnosis not present

## 2023-06-04 DIAGNOSIS — Z1152 Encounter for screening for COVID-19: Secondary | ICD-10-CM | POA: Diagnosis not present

## 2023-06-04 DIAGNOSIS — Z8249 Family history of ischemic heart disease and other diseases of the circulatory system: Secondary | ICD-10-CM

## 2023-06-04 DIAGNOSIS — J441 Chronic obstructive pulmonary disease with (acute) exacerbation: Secondary | ICD-10-CM

## 2023-06-04 DIAGNOSIS — E119 Type 2 diabetes mellitus without complications: Secondary | ICD-10-CM

## 2023-06-04 DIAGNOSIS — E1169 Type 2 diabetes mellitus with other specified complication: Secondary | ICD-10-CM

## 2023-06-04 LAB — I-STAT VENOUS BLOOD GAS, ED
Acid-base deficit: 1 mmol/L (ref 0.0–2.0)
Bicarbonate: 27.8 mmol/L (ref 20.0–28.0)
Calcium, Ion: 1.2 mmol/L (ref 1.15–1.40)
HCT: 45 % (ref 39.0–52.0)
Hemoglobin: 15.3 g/dL (ref 13.0–17.0)
O2 Saturation: 91 %
Potassium: 3.9 mmol/L (ref 3.5–5.1)
Sodium: 142 mmol/L (ref 135–145)
TCO2: 30 mmol/L (ref 22–32)
pCO2, Ven: 65.1 mmHg — ABNORMAL HIGH (ref 44–60)
pH, Ven: 7.238 — ABNORMAL LOW (ref 7.25–7.43)
pO2, Ven: 74 mmHg — ABNORMAL HIGH (ref 32–45)

## 2023-06-04 LAB — COMPREHENSIVE METABOLIC PANEL
ALT: 28 U/L (ref 0–44)
AST: 37 U/L (ref 15–41)
Albumin: 3.4 g/dL — ABNORMAL LOW (ref 3.5–5.0)
Alkaline Phosphatase: 60 U/L (ref 38–126)
Anion gap: 12 (ref 5–15)
BUN: 21 mg/dL — ABNORMAL HIGH (ref 6–20)
CO2: 22 mmol/L (ref 22–32)
Calcium: 8.9 mg/dL (ref 8.9–10.3)
Chloride: 105 mmol/L (ref 98–111)
Creatinine, Ser: 1.24 mg/dL (ref 0.61–1.24)
GFR, Estimated: >60 mL/min (ref >60)
Glucose, Bld: 266 mg/dL — ABNORMAL HIGH (ref 70–99)
Potassium: 3.9 mmol/L (ref 3.5–5.1)
Sodium: 139 mmol/L (ref 135–145)
Total Bilirubin: 0.4 mg/dL (ref 0.0–1.2)
Total Protein: 7.8 g/dL (ref 6.5–8.1)

## 2023-06-04 LAB — CBC WITH DIFFERENTIAL/PLATELET
Abs Immature Granulocytes: 0.02 10*3/uL (ref 0.00–0.07)
Basophils Absolute: 0 10*3/uL (ref 0.0–0.1)
Basophils Relative: 1 %
Eosinophils Absolute: 0.3 10*3/uL (ref 0.0–0.5)
Eosinophils Relative: 5 %
HCT: 46.7 % (ref 39.0–52.0)
Hemoglobin: 14.8 g/dL (ref 13.0–17.0)
Immature Granulocytes: 0 %
Lymphocytes Relative: 23 %
Lymphs Abs: 1.2 10*3/uL (ref 0.7–4.0)
MCH: 33.2 pg (ref 26.0–34.0)
MCHC: 31.7 g/dL (ref 30.0–36.0)
MCV: 104.7 fL — ABNORMAL HIGH (ref 80.0–100.0)
Monocytes Absolute: 0.5 10*3/uL (ref 0.1–1.0)
Monocytes Relative: 9 %
Neutro Abs: 3.4 10*3/uL (ref 1.7–7.7)
Neutrophils Relative %: 62 %
Platelets: 148 10*3/uL — ABNORMAL LOW (ref 150–400)
RBC: 4.46 MIL/uL (ref 4.22–5.81)
RDW: 13.1 % (ref 11.5–15.5)
WBC: 5.5 10*3/uL (ref 4.0–10.5)
nRBC: 0 % (ref 0.0–0.2)

## 2023-06-04 LAB — BRAIN NATRIURETIC PEPTIDE: B Natriuretic Peptide: 23.3 pg/mL (ref 0.0–100.0)

## 2023-06-04 LAB — RESP PANEL BY RT-PCR (RSV, FLU A&B, COVID)  RVPGX2
Influenza A by PCR: NEGATIVE
Influenza B by PCR: NEGATIVE
Resp Syncytial Virus by PCR: NEGATIVE
SARS Coronavirus 2 by RT PCR: NEGATIVE

## 2023-06-04 LAB — GLUCOSE, CAPILLARY
Glucose-Capillary: 291 mg/dL — ABNORMAL HIGH (ref 70–99)
Glucose-Capillary: 290 mg/dL — ABNORMAL HIGH (ref 70–99)

## 2023-06-04 LAB — TROPONIN I (HIGH SENSITIVITY)
Troponin I (High Sensitivity): 14 ng/L (ref <18)
Troponin I (High Sensitivity): 72 ng/L — ABNORMAL HIGH (ref <18)

## 2023-06-04 MED ORDER — ARFORMOTEROL TARTRATE 15 MCG/2ML IN NEBU
15.0000 ug | INHALATION_SOLUTION | Freq: Two times a day (BID) | RESPIRATORY_TRACT | Status: DC
  Administered 2023-06-04 – 2023-06-05 (×2): 15 ug via RESPIRATORY_TRACT
  Filled 2023-06-04 (×2): qty 2

## 2023-06-04 MED ORDER — ALBUTEROL SULFATE (2.5 MG/3ML) 0.083% IN NEBU: 10.0000 mg/h | INHALATION_SOLUTION | RESPIRATORY_TRACT | Status: DC

## 2023-06-04 MED ORDER — GUAIFENESIN ER 600 MG PO TB12: 600.0000 mg | ORAL_TABLET | ORAL | Status: DC | PRN

## 2023-06-04 MED ORDER — SULFAMETHOXAZOLE-TRIMETHOPRIM 800-160 MG PO TABS
1.0000 | ORAL_TABLET | Freq: Every day | ORAL | Status: DC
  Administered 2023-06-04 – 2023-06-05 (×2): 1 via ORAL
  Filled 2023-06-04 (×2): qty 1

## 2023-06-04 MED ORDER — METHYLPREDNISOLONE SODIUM SUCC 125 MG IJ SOLR
125.0000 mg | Freq: Once | INTRAMUSCULAR | Status: AC
  Administered 2023-06-04: 125 mg via INTRAVENOUS
  Filled 2023-06-04: qty 2

## 2023-06-04 MED ORDER — ALBUTEROL SULFATE (2.5 MG/3ML) 0.083% IN NEBU
2.5000 mg | INHALATION_SOLUTION | Freq: Four times a day (QID) | RESPIRATORY_TRACT | Status: DC
  Administered 2023-06-04 – 2023-06-05 (×3): 2.5 mg via RESPIRATORY_TRACT
  Filled 2023-06-04 (×2): qty 3

## 2023-06-04 MED ORDER — REVEFENACIN 175 MCG/3ML IN SOLN
175.0000 ug | Freq: Every day | RESPIRATORY_TRACT | Status: DC
  Administered 2023-06-05: 175 ug via RESPIRATORY_TRACT
  Filled 2023-06-04 (×2): qty 3

## 2023-06-04 MED ORDER — SODIUM CHLORIDE 0.9 % IV SOLN
2.0000 g | INTRAVENOUS | Status: DC
  Administered 2023-06-04: 2 g via INTRAVENOUS
  Filled 2023-06-04: qty 20

## 2023-06-04 MED ORDER — DOLUTEGRAVIR SODIUM 50 MG PO TABS
50.0000 mg | ORAL_TABLET | Freq: Every day | ORAL | Status: DC
  Administered 2023-06-04 – 2023-06-05 (×2): 50 mg via ORAL
  Filled 2023-06-04 (×2): qty 1

## 2023-06-04 MED ORDER — ROSUVASTATIN CALCIUM 20 MG PO TABS
20.0000 mg | ORAL_TABLET | Freq: Every day | ORAL | Status: DC
  Administered 2023-06-04 – 2023-06-05 (×2): 20 mg via ORAL
  Filled 2023-06-04 (×2): qty 1

## 2023-06-04 MED ORDER — SODIUM CHLORIDE 0.9 % IV SOLN
100.0000 mg | Freq: Two times a day (BID) | INTRAVENOUS | Status: DC
  Administered 2023-06-04: 100 mg via INTRAVENOUS
  Filled 2023-06-04 (×2): qty 100

## 2023-06-04 MED ORDER — BUDESONIDE 0.25 MG/2ML IN SUSP
0.2500 mg | Freq: Two times a day (BID) | RESPIRATORY_TRACT | Status: DC
  Administered 2023-06-04 – 2023-06-05 (×2): 0.25 mg via RESPIRATORY_TRACT
  Filled 2023-06-04 (×2): qty 2

## 2023-06-04 MED ORDER — ALBUTEROL SULFATE (2.5 MG/3ML) 0.083% IN NEBU
10.0000 mg/h | INHALATION_SOLUTION | RESPIRATORY_TRACT | Status: DC
  Administered 2023-06-04: 10 mg/h via RESPIRATORY_TRACT
  Filled 2023-06-04: qty 12

## 2023-06-04 MED ORDER — ENOXAPARIN SODIUM 40 MG/0.4ML IJ SOSY
40.0000 mg | PREFILLED_SYRINGE | INTRAMUSCULAR | Status: DC
  Administered 2023-06-04 – 2023-06-05 (×2): 40 mg via SUBCUTANEOUS
  Filled 2023-06-04 (×2): qty 0.4

## 2023-06-04 MED ORDER — IPRATROPIUM BROMIDE 0.02 % IN SOLN: 0.5000 mg | Freq: Once | RESPIRATORY_TRACT | Status: DC

## 2023-06-04 MED ORDER — IPRATROPIUM BROMIDE 0.02 % IN SOLN
1.0000 mg | Freq: Once | RESPIRATORY_TRACT | Status: AC
  Administered 2023-06-04: 1 mg via RESPIRATORY_TRACT
  Filled 2023-06-04: qty 5

## 2023-06-04 MED ORDER — GUAIFENESIN ER 600 MG PO TB12: 600.0000 mg | ORAL_TABLET | Freq: Two times a day (BID) | ORAL | Status: DC

## 2023-06-04 MED ORDER — INSULIN ASPART 100 UNIT/ML IJ SOLN
0.0000 [IU] | Freq: Three times a day (TID) | INTRAMUSCULAR | Status: DC
Start: 2023-06-04 — End: 2023-06-05
  Administered 2023-06-04: 8 [IU] via SUBCUTANEOUS
  Administered 2023-06-05 (×2): 5 [IU] via SUBCUTANEOUS

## 2023-06-04 MED ORDER — PREDNISONE 20 MG PO TABS
40.0000 mg | ORAL_TABLET | Freq: Every day | ORAL | Status: DC
  Administered 2023-06-05: 40 mg via ORAL
  Filled 2023-06-04: qty 2

## 2023-06-04 MED ORDER — DARUN-COBIC-EMTRICIT-TENOFAF 800-150-200-10 MG PO TABS
1.0000 | ORAL_TABLET | Freq: Every day | ORAL | Status: DC
  Administered 2023-06-04 – 2023-06-05 (×2): 1 via ORAL
  Filled 2023-06-04 (×2): qty 1

## 2023-06-04 MED ORDER — GUAIFENESIN ER 600 MG PO TB12
600.0000 mg | ORAL_TABLET | Freq: Two times a day (BID) | ORAL | Status: DC
  Administered 2023-06-04 – 2023-06-05 (×2): 600 mg via ORAL
  Filled 2023-06-04 (×2): qty 1

## 2023-06-04 MED ORDER — DOXYCYCLINE HYCLATE 100 MG PO TABS
100.0000 mg | ORAL_TABLET | Freq: Two times a day (BID) | ORAL | Status: DC
  Administered 2023-06-04 – 2023-06-05 (×2): 100 mg via ORAL
  Filled 2023-06-04 (×2): qty 1

## 2023-06-04 MED ORDER — ALBUTEROL SULFATE (2.5 MG/3ML) 0.083% IN NEBU
2.5000 mg | INHALATION_SOLUTION | RESPIRATORY_TRACT | Status: DC
  Filled 2023-06-04: qty 3

## 2023-06-04 NOTE — ED Triage Notes (Signed)
 EMS was called out for respiratory distress. Pt was initially 70% RA. EMS reports rales in lower lobes. Upon arrival to ED pt was on CPAP, O2 was at 94%. Hx CHF, HIV and hx of intubation.  144/90 BP 112 P 38 Cap 26 RR 20 L Hand

## 2023-06-04 NOTE — H&P (Signed)
 Date: 06/04/2023               Patient Name:  [name] MRN: [id number]  DOB: 1970/05/20 Age / Sex: 53 y.o., male   PCP: Manuela Neptune, MD         Medical Service: Internal Medicine Teaching Service         Attending Physician: Dr. Mercie Eon, MD      First Contact: Dr. Carmina Miller, DO Pager [phone]    Second Contact: Dr. Marrianne Mood, MD Pager [phone]         After Hours (After 5p/  First Contact Pager: [phone]  weekends / holidays): Second Contact Pager: [phone]   SUBJECTIVE   Chief Complaint: SOB  History of Present Illness: History gained via use of an interpreter. [name] is a 53 year old male with history of HIV/AIDS (CD4 count of 84, 3 months ago) on ARVT, presumed COPD on 3 L at home with exertion, bronchiectasis, provoked PE (2017), HFrecEF, colon cancer s/p resection in 2020 with colostomy in place, well-controlled T2DM, and HLD who presents to ED via EMS after he called 911 at restaurant for SOB. EMS found him to be hypoxic in 70's. He states he has had increasing SOB for the past week with a stable dry cough which is chronic but more bothersome during this same time frame. He has been using Breztri as prescribed but has used rescue inhaler multiple times/day. Denies sick contacts, fevers, chills, chest pain/palpitations, abdominal pain, dysuria, or change in bowel habits.  Medications:  Current Outpatient Medications  Medication Instructions   albuterol (VENTOLIN HFA) 108 (90 Base) MCG/ACT inhaler 2 puffs, Inhalation, Every 6 hours PRN   Budeson-Glycopyrrol-Formoterol (BREZTRI AEROSPHERE) 160-9-4.8 MCG/ACT AERO 1 puff, Inhalation, 2 times daily   Darunavir-Cobicistat-Emtricitabine-Tenofovir Alafenamide (SYMTUZA) 800-150-200-10 MG TABS 1 tablet, Oral, Daily with breakfast   [Paused] losartan (COZAAR) 25 mg, Oral, Daily   rosuvastatin (CRESTOR) 20 mg, Oral, Daily   sulfamethoxazole-trimethoprim (BACTRIM DS) 800-160 MG tablet 1  tablet, Oral, Daily   Tivicay 50 mg, Oral, Daily   triamcinolone ointment (KENALOG) 0.1 % 1 Application, Topical, 2 times daily    Past Medical History Past Medical History:  Diagnosis Date   Acute pulmonary embolus ([name]) 02/24/2016   Dx December 2017 taking Eliquis 5mg  BID   Allergy 09/30/2021   Bronchiectasis with acute exacerbation ([name])    Bronchiectasis without acute exacerbation ([name]) 04/22/2021   Depression    "stress and depression for any man is common" (03/21/2014)   Diabetes mellitus without complication ([name])    DVT (deep venous thrombosis) ([name]) 11/03/2020   Dyspnea    Genital warts 01/04/2017   Hepatitis    "I don't know what hepatitis I have"   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   History of tuberculosis 11/01/2020   HIV disease ([name])    MSSA bacteremia    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)    S/P ORIF (open reduction internal fixation) fracture 03/21/2014   Steroid-induced hyperglycemia 09/05/2021   TB (pulmonary tuberculosis)    previously treated according to refugee documentation    Past Surgical History Past Surgical History:  Procedure Laterality Date   BRONCHIAL WASHINGS Bilateral 04/23/2021   Procedure: BRONCHIAL WASHINGS;  Surgeon: Lorin Glass, MD;  Location[facility] ENDOSCOPY;  Service: Pulmonary;  Laterality: Bilateral;   FRACTURE SURGERY     IM NAILING TIBIA Right 03/21/2014   ORIF ANKLE FRACTURE  Right 03/21/2014   lateral malleolus/notes 03/21/2014   ORIF ANKLE FRACTURE Right 03/21/2014   Procedure: OPEN REDUCTION INTERNAL FIXATION (ORIF) pilon ;  Surgeon: Eldred Manges, MD;  Location: [facility] OR;  Service: Orthopedics;  Laterality: Right;   TEE WITHOUT CARDIOVERSION N/A 05/23/2019   Procedure: TRANSESOPHAGEAL ECHOCARDIOGRAM (TEE);  Surgeon: Chilton Si, MD;  Location: [facility] ENDOSCOPY;  Service: Cardiovascular;  Laterality: N/A;   TIBIA IM NAIL INSERTION  Right 03/21/2014   Procedure: INTRAMEDULLARY (IM) NAIL TIBIAL;  Surgeon: Eldred Manges, MD;  Location: [facility] OR;  Service: Orthopedics;  Laterality: Right;   VIDEO BRONCHOSCOPY Bilateral 03/02/2016   Procedure: VIDEO BRONCHOSCOPY WITHOUT FLUORO;  Surgeon: Roslynn Amble, MD;  Location: [facility] ENDOSCOPY;  Service: Cardiopulmonary;  Laterality: Bilateral;   VIDEO BRONCHOSCOPY Left 04/23/2021   Procedure: VIDEO BRONCHOSCOPY WITHOUT FLUORO;  Surgeon: Lorin Glass, MD;  Location: [facility] ENDOSCOPY;  Service: Pulmonary;  Laterality: Left;    Social:  Lives at home with self Level of Function: Independent PCP: Dr. Hassan Rowan Substances:Denies alcohol, tobacco, drug use  Family History:  Family History  Problem Relation Age of Onset   Hypertension Other    Heart disease Sister     Allergies: Allergies as of 06/04/2023 - Review Complete 06/04/2023  Allergen Reaction Noted   Metformin and related Rash 02/27/2021    Review of Systems: A complete ROS was negative except as per HPI.   OBJECTIVE:   Physical Exam: Blood pressure 127/84, pulse 89, temperature 97.9 F (36.6 C), temperature source Oral, resp. rate 15, SpO2 94%.   Constitutional: lying in bed on BiPAP, NAD Cardiovascular: regular rate and rhythm, no m/r/g, mild LEE Pulmonary/Chest: on BiPAP, lungs clear to auscultation bilaterally Abdominal: soft, non-tender, non-distended MSK: normal bulk and tone Neurological: alert & oriented x 3, no focal deficit Skin: warm and dry Psych: normal mood and behavior   Labs: CBC    Component Value Date/Time   WBC 5.5 06/04/2023 0640   RBC 4.46 06/04/2023 0640   HGB 15.3 06/04/2023 0646   HGB 16.1 09/03/2021 1215   HCT 45.0 06/04/2023 0646   HCT 47.3 09/03/2021 1215   PLT 148 (L) 06/04/2023 0640   PLT 266 09/03/2021 1215   MCV 104.7 (H) 06/04/2023 0640   MCV 98 (H) 09/03/2021 1215   MCH 33.2 06/04/2023 0640   MCHC 31.7 06/04/2023 0640   RDW 13.1 06/04/2023 0640   RDW 11.9  09/03/2021 1215   LYMPHSABS 1.2 06/04/2023 0640   MONOABS 0.5 06/04/2023 0640   EOSABS 0.3 06/04/2023 0640   BASOSABS 0.0 06/04/2023 0640     CMP     Component Value Date/Time   NA 142 06/04/2023 0646   NA 139 04/21/2023 1034   K 3.9 06/04/2023 0646   CL 105 06/04/2023 0640   CO2 22 06/04/2023 0640   GLUCOSE 266 (H) 06/04/2023 0640   BUN 21 (H) 06/04/2023 0640   BUN 16 04/21/2023 1034   CREATININE 1.24 06/04/2023 0640   CREATININE 1.26 02/15/2023 1142   CALCIUM 8.9 06/04/2023 0640   PROT 7.8 06/04/2023 0640   ALBUMIN 3.4 (L) 06/04/2023 0640   AST 37 06/04/2023 0640   ALT 28 06/04/2023 0640   ALKPHOS 60 06/04/2023 0640   BILITOT 0.4 06/04/2023 0640   GFRNONAA >60 06/04/2023 0640   GFRNONAA 68 12/04/2019 1118   GFRAA 79 12/04/2019 1118    Imaging:  EKG: personally reviewed my interpretation is sinus tachycardia with borderline QTc prolongation (490)  ASSESSMENT & PLAN:  Assessment & Plan by Problem: Principal Problem:   Acute on chronic hypoxic respiratory failure ([name]) Active Problems:   AIDS ([name])   Type II diabetes mellitus, well controlled ([name])   Bronchiectasis ([name])   COPD (chronic obstructive pulmonary disease) ([name])   Heart failure with recovered ejection fraction (HFrecEF) ([name])   COPD exacerbation ([name])   [name] is a 53 y.o.male with history of HIV/AIDS (CD4 count of 84, 3 months ago) on ARVT, presumed COPD on 3 L at home with exertion, bronchiectasis, provoked PE (2017), HFrecEF, colon cancer s/p resection in 2020 with colostomy in place, well-controlled T2DM, and HLD who is admitted for acute on chronic respiratory failure with hypoxia and hypercapnia   Acute on Chronic Respiratory Failure with Hypoxia and Hypercapnia COPD HDS. Hypercapnic on admission at 66. Respiratory panel negative. His SOB/O2 have improved since arrival to the hospital now off BiPAP and on O2 via nasal cannula.  This person has severe chronic lung disease including  COPD and bronchiectasis.  They are immunocompromised, and I think could be at risk for opportunistic infections like NTM.  For now we will treat for COPD exacerbation and continue antibiotics for community-acquired pneumonia. -Scheduled Nebulizers: Albuterol, Brovana, Pulmicort -Prednisone 40 mg daily (Day 1/5), s/p 125 mg Solumedrol in ED -IV Rocephin 1 g daily (1/3) -Guaifenesin -Incentive spirometry  AIDS CD4 count is <100, he is on Bactrim for pneumocystis and toxoplasmosis prophylaxis.  He is at risk for opportunistic infections including NTM/MAC.  I do not think that this is PJP because its localized to the left lung primarily.  Continue his outpatient antiretrovirals and prophylactic Bactrim.  Type II diabetes mellitus, well controlled Well-controlled.  Hyperglycemic with acute illness and steroids.  Does not take outpatient insulin, but will start sliding scale while he is on steroids.  Bronchiectasis Bronchoscopy showed this in 2023.  A bronchoalveolar lavage was obtained for pneumonia then, I do not see the results from that encounter.  Pulmonology follow-up was recommended but seems like follow-up with them was sporadic and he has not had much in the way of imaging or workup for this bronchiectasis.  At any rate, I do think this puts him at risk for bacterial infections, including, but also MAC/NTM.  We are going to treat for infection as an underlying cause of his COPD and bronchiectasis flare. Will use doxycycline and ceftriaxone, avoid azithromycin out of concern for MAC/NTM.  Consider repeat chest CT.  Heart failure with recovered ejection fraction (HFrecEF)  Stable, no exacerbation. Some mild LE edema but no other signs of volume overload.  HLD -Continue home Crestor  Elevated Troponin 14> 72. No chest pain. EKG reassuring.  Diet: Normal VTE: Enoxaparin Code: Full   Dispo: Admit patient to Inpatient with expected length of stay greater than 2  midnights.  Signed: Carmina Miller, DO Internal Medicine Resident PGY-1 06/04/2023, 5:41 PM

## 2023-06-04 NOTE — Plan of Care (Signed)
   Problem: Education: Goal: Knowledge of General Education information will improve Description: Including pain rating scale, medication(s)/side effects and non-pharmacologic comfort measures Outcome: Progressing   Problem: Clinical Measurements: Goal: Respiratory complications will improve Outcome: Progressing

## 2023-06-04 NOTE — ED Provider Notes (Signed)
 [place] EMERGENC[facility] DEPARTMENT AT [facility] Provider Note   CSN: [id number] Arrival date & time: 06/04/23  8657     History  Chief Complaint  Patient presents with   Respiratory Distress    [name] is a 53 y.o. male.  Patient presents to er with EMS secondary to [place] distress.  Patient called EMS from restaurant and they found in their hypoxic in the 70s and tachypneic with crackles bilaterally.  Has a history of heart failure.  Patient also states he uses an albuterol.  Reported history of intubation for same.  CPAP started and route.  Blood pressure initially 1 teens but then went up to about 140 prior to arrival here.  Mildly tachycardic.  Patient states compliance with medications.  History secondary to positive pressure ventilation and acuity of situation.        Home Medications Prior to Admission medications   Medication Sig Start Date End Date Taking? Authorizing Provider  albuterol (VENTOLIN HFA) 108 (90 Base) MCG/ACT inhaler Inhale 2 puffs into the lungs every 6 (six) hours as needed for wheezing or shortness of breath. 04/21/23   Philomena Doheny, MD  Budeson-Glycopyrrol-Formoterol (BREZTRI AEROSPHERE) 160-9-4.8 MCG/ACT AERO Inhale 1 puff into the lungs 2 (two) times daily. 04/21/23   Philomena Doheny, MD  Darunavir-Cobicistat-Emtricitabine-Tenofovir Alafenamide [facility]) 800-150-200-10 MG TABS Take 1 tablet by mouth daily with breakfast. 05/18/23   Judyann Munson, MD  dolutegravir (TIVICAY) 50 MG tablet Take 1 tablet (50 mg total) by mouth daily. 05/18/23   Judyann Munson, MD  losartan (COZAAR) 25 MG tablet Take 1 tablet (25 mg total) by mouth daily. 03/22/23 03/21/24  Gwenevere Abbot, MD  rosuvastatin (CRESTOR) 20 MG tablet Take 1 tablet (20 mg total) by mouth daily. 03/22/23 03/21/24  Gwenevere Abbot, MD  sulfamethoxazole-trimethoprim (BACTRIM DS) 800-160 MG tablet Take 1 tablet by mouth daily. 05/18/23   Judyann Munson, MD  triamcinolone  ointment (KENALOG) 0.1 % Apply 1 Application topically 2 (two) times daily. Patient taking differently: Apply 1 Application topically 2 (two) times daily as needed (rash). 02/15/23   Judyann Munson, MD      Allergies    Metformin and related    Review of Systems   Review of Systems  Physical Exam Updated Vital Signs BP (!) 134/95   Pulse (!) 104   Temp (!) 96.9 F (36.1 C) (Axillary)   Resp (!) 25   SpO2 100%  Physical Exam Vitals and nursing note reviewed.  Constitutional:      General: He is in acute distress.     Appearance: He is well-developed.  HENT:     Head: Normocephalic and atraumatic.  Cardiovascular:     Rate and Rhythm: Tachycardia present.  Pulmonary:     Effort: Tachypnea, accessory muscle usage and respiratory distress present.     Breath sounds: Decreased air movement present. Decreased breath sounds and rales present.  Abdominal:     General: There is no distension.     Tenderness: There is no abdominal tenderness.  Musculoskeletal:        General: Normal range of motion.     Cervical back: Normal range of motion.  Skin:    General: Skin is warm and dry.  Neurological:     General: No focal deficit present.     Mental Status: He is alert.     ED Results / Procedures / Treatments   Labs (all labs ordered are listed, but only abnormal results are displayed) Labs  Reviewed  I-STAT VENOUS BLOOD GAS, ED - Abnormal; Notable for the following components:      Result Value   pH, Ven 7.238 (*)    pCO2, Ven 65.1 (*)    pO2, Ven 74 (*)    All other components within normal limits  RESP PANEL BY RT-PCR (RSV, FLU A&B, COVID)  RVPGX2  CBC WITH DIFFERENTIAL/PLATELET  COMPREHENSIVE METABOLIC PANEL  BRAIN NATRIURETIC PEPTIDE  TROPONIN I (HIGH SENSITIVITY)    EKG EKG Interpretation Date/Time:  Friday June 04 2023 06:37:18 EDT Ventricular Rate:  103 PR Interval:  135 QRS Duration:  108 QT Interval:  374 QTC Calculation: 490 R Axis:   204  Text  Interpretation: Sinus tachycardia Right atrial enlargement Right axis deviation Abnormal R-wave progression, late transition Borderline T wave abnormalities Borderline prolonged QT interval Confirmed by Marily Memos [phone]) on 06/04/2023 7:04:21 AM  Radiology DG Chest Portable 1 View Result Date: 06/04/2023 CLINICAL DATA:  Evaluate for fluid. EXAM: PORTABLE CHEST 1 VIEW COMPARISON:  Portable chest 04/07/2023, CTA chest 08/19/2022, and PA and lateral chest 01/18/2023. FINDINGS: The lungs are emphysematous. Coarsely reticular fibrotic changes again seen in the left mid and lower lung field, with interval increased left lower lobe opacity concerning for pneumonia or aspiration, with background lower lobe cylindrical bronchiectasis. Remaining lungs clear apart from asymmetric right apical linear scarring. The cardiomediastinal silhouette and vascular markings are normal. Thoracic cage is intact. IMPRESSION: 1. Interval increased left lower lobe opacity concerning for pneumonia or aspiration, with background fibrotic changes and bronchiectasis. 2. Emphysema. Electronically Signed   By: Almira Bar M.D.   On: 06/04/2023 06:49    Procedures .Critical Care  Performed by: Marily Memos, MD Authorized by: Marily Memos, MD   Critical care provider statement:    Critical care time (minutes):  30   Critical care was necessary to treat or prevent imminent or life-threatening deterioration of the following conditions:  Respiratory failure   Critical care was time spent personally by me on the following activities:  Development of treatment plan with patient or surrogate, discussions with consultants, evaluation of patient's response to treatment, examination of patient, ordering and review of laboratory studies, ordering and review of radiographic studies, ordering and performing treatments and interventions, pulse oximetry, re-evaluation of patient's condition and review of old charts     Medications  Ordered in ED Medications  albuterol (PROVENTIL) (2.5 MG/3ML) 0.083% nebulizer solution (has no administration in time range)  doxycycline (VIBRAMYCIN) 100 mg in sodium chloride 0.9 % 250 mL IVPB (100 mg Intravenous New Bag/Given 06/04/23 0702)  ipratropium (ATROVENT) nebulizer solution 1 mg (has no administration in time range)  methylPREDNISolone sodium succinate (SOLU-MEDROL) 125 mg/2 mL injection 125 mg (125 mg Intravenous Given 06/04/23 0700)    ED Course/ Medical Decision Making/ A&P                                 Medical Decision Making Amount and/or Complexity of Data Reviewed Labs: ordered. Radiology: ordered. ECG/medicine tests: ordered.  Risk Prescription drug management.   He was most consistent with fluid overload with his history.  However we will wait for x-ray and vital signs before attempting nitroglycerin.  Will continue the BiPap for now.  Further review of his records I do not see any history of heart failure.  He was admitted recently for COPD exacerbation was post to be on 2 L at home.  Unclear on whether  or not he was on 2 L.  Patient is doing much better on the BiPAP.  His blood gas is consistent with a respiratory acidosis.  Will add on antibiotics after I reviewed his x-ray that looks like left lower lobe pneumonia worse than January.  Also ordered Solu-Medrol, albuterol and ipratropium.  Patient will likely need to be admitted once labs are returned.  Unclear on level of care depending on how he does on the BiPAP.  Care transferred pending reevaluation and disposition.  Final Clinical Impression(s) / ED Diagnoses Final diagnoses:  None    Rx / DC Orders ED Discharge Orders     None         Gearldene Fiorenza, Barbara Cower, MD 06/04/23 [phone]

## 2023-06-04 NOTE — ED Provider Notes (Addendum)
 H/O COPD resp distress on BiPAP Physical Exam  BP (!) 134/95   Pulse (!) 104   Temp (!) 96.9 F (36.1 C) (Axillary)   Resp (!) 25   SpO2 100%   Physical Exam  Procedures  Procedures  ED Course / MDM    Medical Decision Making Amount and/or Complexity of Data Reviewed Labs: ordered. Radiology: ordered. ECG/medicine tests: ordered.  Risk Prescription drug management. Decision regarding hospitalization.  Patient is doing well on BiPAP.  I have checked multiple times.  At 09: 50, patient is resting but awakens easily to voice.  He communicates with hand signals and is in no acute distress.  Blood pressures are normotensive.  Oxygen saturation 100% on BiPAP.  Will trial transition to nasal cannula.  I reviewed all diagnostic results.  Dr. Clayborne Dana started methylprednisolone, doxycycline and continuous nebulizer therapy.  At this time appropriate for admission for COPD exacerbation with chest x-ray showing area of consolidation suggestive of pneumonia.  10: 09 consult with internal medicine teaching service.  They will see patient in the emergency department for admission.        Arby Barrette, MD 06/04/23 1005    Arby Barrette, MD 06/04/23 1009

## 2023-06-05 ENCOUNTER — Other Ambulatory Visit (HOSPITAL_COMMUNITY): Payer: Self-pay

## 2023-06-05 DIAGNOSIS — B20 Human immunodeficiency virus [HIV] disease: Secondary | ICD-10-CM | POA: Diagnosis not present

## 2023-06-05 DIAGNOSIS — J9621 Acute and chronic respiratory failure with hypoxia: Secondary | ICD-10-CM | POA: Diagnosis not present

## 2023-06-05 LAB — CBC
HCT: 42.8 % (ref 39.0–52.0)
Hemoglobin: 14.1 g/dL (ref 13.0–17.0)
MCH: 33 pg (ref 26.0–34.0)
MCHC: 32.9 g/dL (ref 30.0–36.0)
MCV: 100.2 fL — ABNORMAL HIGH (ref 80.0–100.0)
Platelets: 153 10*3/uL (ref 150–400)
RBC: 4.27 MIL/uL (ref 4.22–5.81)
RDW: 13.1 % (ref 11.5–15.5)
WBC: 9.4 10*3/uL (ref 4.0–10.5)
nRBC: 0 % (ref 0.0–0.2)

## 2023-06-05 LAB — GLUCOSE, CAPILLARY
Glucose-Capillary: 209 mg/dL — ABNORMAL HIGH (ref 70–99)
Glucose-Capillary: 216 mg/dL — ABNORMAL HIGH (ref 70–99)

## 2023-06-05 MED ORDER — ALBUTEROL SULFATE (2.5 MG/3ML) 0.083% IN NEBU
2.5000 mg | INHALATION_SOLUTION | Freq: Three times a day (TID) | RESPIRATORY_TRACT | Status: DC
  Administered 2023-06-05: 2.5 mg via RESPIRATORY_TRACT
  Filled 2023-06-05: qty 3

## 2023-06-05 MED ORDER — PREDNISONE 20 MG PO TABS
40.0000 mg | ORAL_TABLET | Freq: Every day | ORAL | 0 refills | Status: DC
  Filled 2023-06-05: qty 3, 1d supply, fill #0

## 2023-06-05 MED ORDER — DOXYCYCLINE HYCLATE 100 MG PO TABS
100.0000 mg | ORAL_TABLET | Freq: Two times a day (BID) | ORAL | 0 refills | Status: DC
  Filled 2023-06-05: qty 7, 4d supply, fill #0

## 2023-06-05 NOTE — Discharge Summary (Signed)
 Name: [name] MRN: [id number] DOB: 1971-02-22 53 y.o. PCP: Manuela Neptune, MD  Date of Admission: 06/04/2023  6:24 AM Date of Discharge:  06/05/2023 Attending Physician: Dr.  Johny Sax  DISCHARGE DIAGNOSIS:  Primary Problem: Acute on chronic hypoxic respiratory failure [facility])   Hospital Problems: Principal Problem:   Acute on chronic hypoxic respiratory failure ([name]) Active Problems:   AIDS ([name])   Type II diabetes mellitus, well controlled ([name])   Bronchiectasis ([name])   COPD (chronic obstructive pulmonary disease) ([name])   Heart failure with recovered ejection fraction (HFrecEF) ([name])   COPD exacerbation ([name])    DISCHARGE MEDICATIONS:   Allergies as of 06/05/2023       Reactions   Metformin And Related Rash        Medication List     TAKE these medications    albuterol 108 (90 Base) MCG/ACT inhaler Commonly known as: VENTOLIN HFA Inhale 2 puffs into the lungs every 6 (six) hours as needed for wheezing or shortness of breath.   Breztri Aerosphere 160-9-4.8 MCG/ACT Aero Generic drug: budeson-glycopyrrolate-formoterol Inhale 1 puff into the lungs 2 (two) times daily.   doxycycline 100 MG tablet Commonly known as: VIBRA-TABS Take 1 tablet (100 mg total) by mouth every 12 (twelve) hours.   losartan 25 MG tablet Commonly known as: Cozaar Take 1 tablet (25 mg total) by mouth daily.   predniSONE 20 MG tablet Commonly known as: DELTASONE Take 2 tablets (40 mg total) by mouth daily with breakfast. Start taking on: June 06, 2023   rosuvastatin 20 MG tablet Commonly known as: Crestor Take 1 tablet (20 mg total) by mouth daily.   sulfamethoxazole-trimethoprim 800-160 MG tablet Commonly known as: BACTRIM DS Take 1 tablet by mouth daily.   Symtuza 800-150-200-10 MG Tabs Generic drug: Darunavir-Cobicistat-Emtricitabine-Tenofovir Alafenamide Take 1 tablet by mouth daily with breakfast.   Tivicay 50 MG tablet Generic drug:  dolutegravir Take 1 tablet (50 mg total) by mouth daily.   triamcinolone ointment 0.1 % Commonly known as: KENALOG Apply 1 Application topically 2 (two) times daily. What changed:  when to take this reasons to take this        DISPOSITION AND FOLLOW-UP:  [name] was discharged from [facility] in Stable condition. At the hospital follow up visit please address:  Acute on Chronic Respiratory Failure with Hypoxia and Hypercapnia COPD Ensure resolution of exacerbation  HIV/AIDS Ensure compliance with ARVT and Bactrim for prophylaxis   HOSPITAL COURSE:  Patient Summary:  Acute on Chronic Respiratory Failure with Hypoxia and Hypercapnia COPD Presented to ED via EMS after calling 911 for shortness of breath while eating at a restaurant.  On EMS arrival he was hypoxic in 70's.  On arrival to ED, hypercapnic at 65 so he was placed on BiPAP and given Solu-Medrol.  O2 saturation and SOB improved so he was placed on 2 L nasal cannula.  Home Breztri was given via equivalent nebulizers and he was started on prednisone/antibiotics for CAP coverage.  O2 was back to baseline on day of discharge.  Prednisone and doxycycline will be continued to complete 5-day course.  He has oxygen at home and will follow-up with PCP. DISCHARGE INSTRUCTIONS:   Discharge Instructions     Call MD for:  difficulty breathing, headache or visual disturbances   Complete by: As directed    Call MD for:  extreme fatigue   Complete by: As directed    Call MD for:  hives   Complete  by: As directed    Call MD for:  persistant dizziness or light-headedness   Complete by: As directed    Call MD for:  persistant nausea and vomiting   Complete by: As directed    Call MD for:  redness, tenderness, or signs of infection (pain, swelling, redness, odor or green/yellow discharge around incision site)   Complete by: As directed    Call MD for:  severe uncontrolled pain   Complete by: As  directed    Call MD for:  temperature >100.4   Complete by: As directed    Diet - low sodium heart healthy   Complete by: As directed    Discharge instructions   Complete by: As directed    Ulitibiwa kwa kuzidisha kwa COPD.  Hii ilitibiwa kwa matibabu ya kupumua, steroids, na antibiotics.  Hali yako ya oksijeni sasa imerejea katika msingi. Tafadhali tumia oksijeni ya nyumbani kwako.   Increase activity slowly   Complete by: As directed        SUBJECTIVE:  SOB improved. Comfortable with discharge Discharge Vitals:   BP 127/80 (BP Location: Right Arm)   Pulse 88   Temp 97.6 F (36.4 C) (Oral)   Resp 20   Ht 5' 8.9" (1.75 m) Comment: per pt  Wt 83 kg   SpO2 92%   BMI 27.10 kg/m   OBJECTIVE:   Constitutional: lying in bed on [place], NAD Cardiovascular: regular rate and rhythm, no m/r/g, mild LEE Pulmonary/Chest: mild rhonchi bilaterally, normal work of breathing on 2 L [place] Skin: warm and dry Psych: normal mood and behavior  Pertinent Labs, Studies, and Procedures:     Latest Ref Rng & Units 06/05/2023    4:24 AM 06/04/2023    6:46 AM 06/04/2023    6:40 AM  CBC  WBC 4.0 - 10.5 K/uL 9.4   5.5   Hemoglobin 13.0 - 17.0 g/dL 82.9  56.2  13.0   Hematocrit 39.0 - 52.0 % 42.8  45.0  46.7   Platelets 150 - 400 K/uL 153   148        Latest Ref Rng & Units 06/04/2023    6:46 AM 06/04/2023    6:40 AM 04/21/2023   10:34 AM  CMP  Glucose 70 - 99 mg/dL  865  784   BUN 6 - 20 mg/dL  21  16   Creatinine 6.96 - 1.24 mg/dL  2.95  2.84   Sodium 132 - 145 mmol/L 142  139  139   Potassium 3.5 - 5.1 mmol/L 3.9  3.9  4.0   Chloride 98 - 111 mmol/L  105  101   CO2 22 - 32 mmol/L  22  25   Calcium 8.9 - 10.3 mg/dL  8.9  9.4   Total Protein 6.5 - 8.1 g/dL  7.8    Total Bilirubin 0.0 - 1.2 mg/dL  0.4    Alkaline Phos 38 - 126 U/L  60    AST 15 - 41 U/L  37    ALT 0 - 44 U/L  28      DG Chest Portable 1 View Result Date: 06/04/2023 CLINICAL DATA:  Evaluate for fluid. EXAM: PORTABLE CHEST 1  VIEW COMPARISON:  Portable chest 04/07/2023, CTA chest 08/19/2022, and PA and lateral chest 01/18/2023. FINDINGS: The lungs are emphysematous. Coarsely reticular fibrotic changes again seen in the left mid and lower lung field, with interval increased left lower lobe opacity concerning for pneumonia or aspiration, with background lower lobe cylindrical  bronchiectasis. Remaining lungs clear apart from asymmetric right apical linear scarring. The cardiomediastinal silhouette and vascular markings are normal. Thoracic cage is intact. IMPRESSION: 1. Interval increased left lower lobe opacity concerning for pneumonia or aspiration, with background fibrotic changes and bronchiectasis. 2. Emphysema. Electronically Signed   By: Almira Bar M.D.   On: 06/04/2023 06:49     Signed: Carmina Miller, DO  Internal Medicine Resident, PGY-1 [facility]  Pager: [phone] 2:47 PM, 06/05/2023

## 2023-06-05 NOTE — Progress Notes (Signed)
 SATURATION QUALIFICATIONS: (This note is used to comply with regulatory documentation for home oxygen)  Patient Saturations on Room Air at Rest = 90%  Patient Saturations on Room Air while Ambulating = 82%  Patient Saturations on 2 Liters of oxygen while Ambulating = 97%  Please briefly explain why patient needs home oxygen: Need 02 to recover

## 2023-06-07 IMAGING — CT CT CHEST W/O CM
2 of 4 series · 15 of 36 positions shown, 18 images · non-contrast
Comparison: CT angiogram chest 08/22/2021.

CLINICAL DATA: Immunocompromise. Acute on chronic respiratory
failure.



[Series 4: thorax 2.0 · axial · 0.82mm/px · z∈[+1220,+1530]mm · 12 of 175 slices shown, 15 images]
[im 10/175  mediastinal]
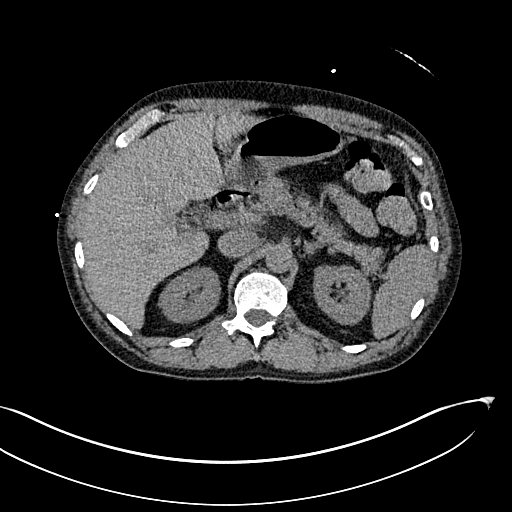
[im 10/175  lung]
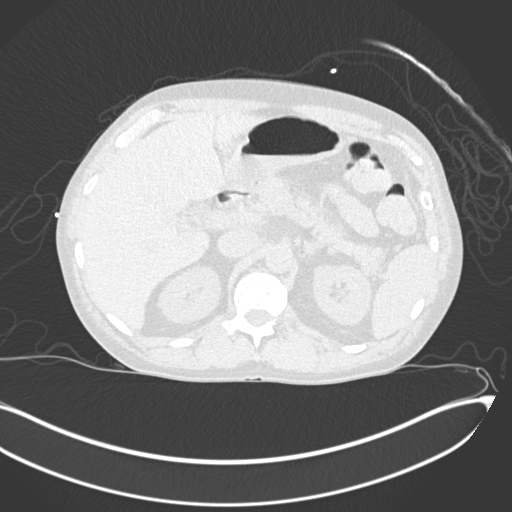
[im 28/175  lung]
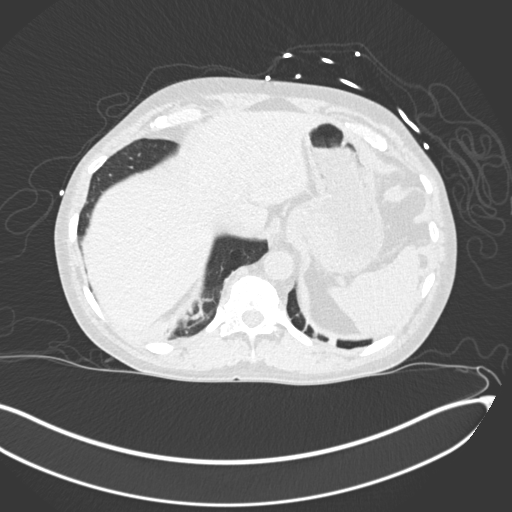
[im 37/175  lung]
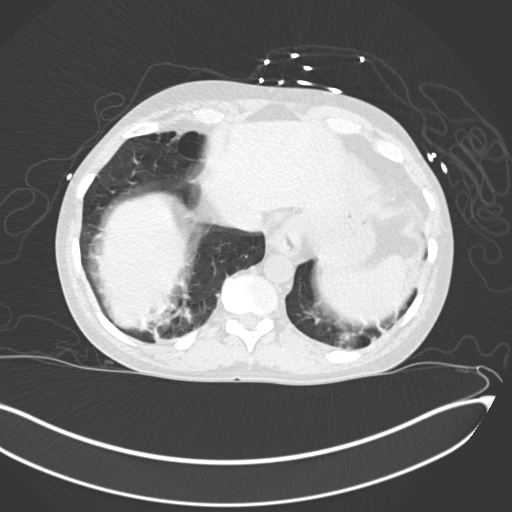
[im 55/175  lung]
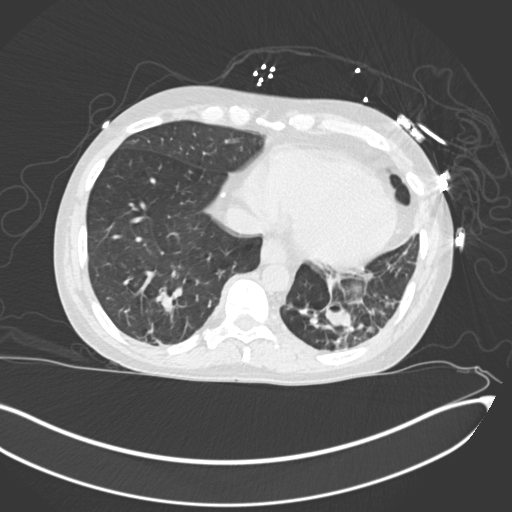
[im 65/175  mediastinal]
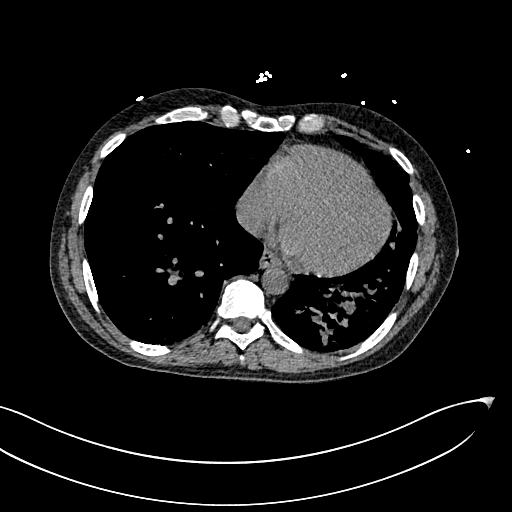
[im 65/175  lung]
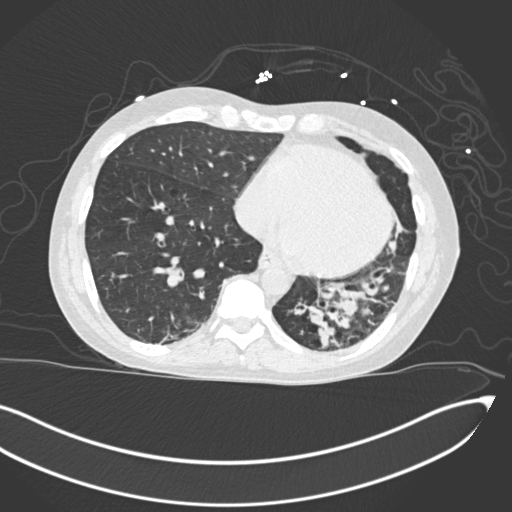
[im 83/175  lung]
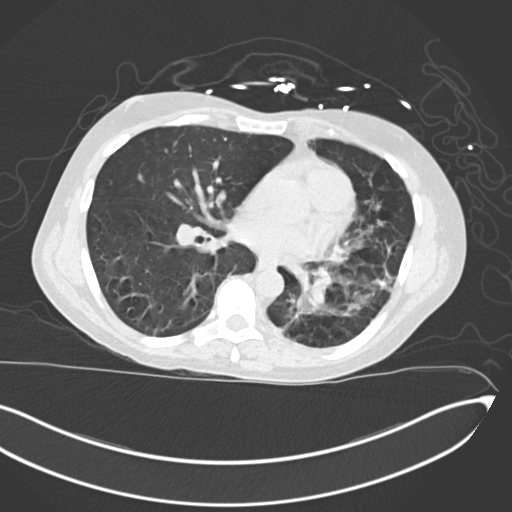
[im 92/175  lung]
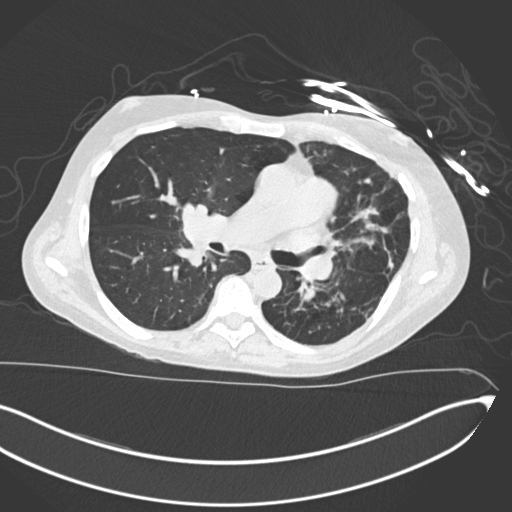
[im 110/175  lung]
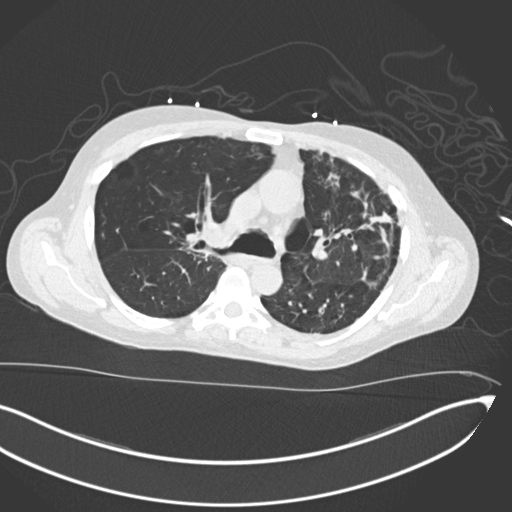
[im 120/175  mediastinal]
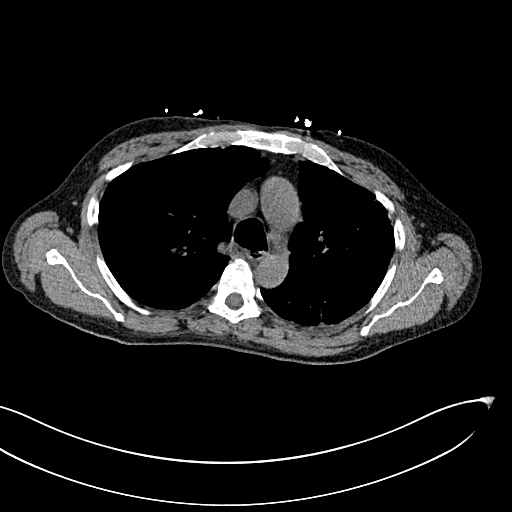
[im 120/175  lung]
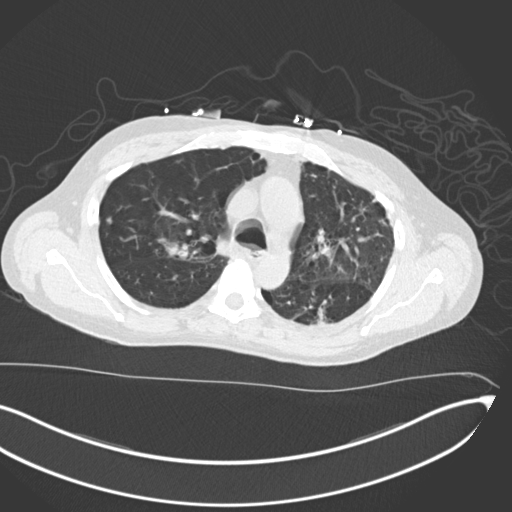
[im 138/175  lung]
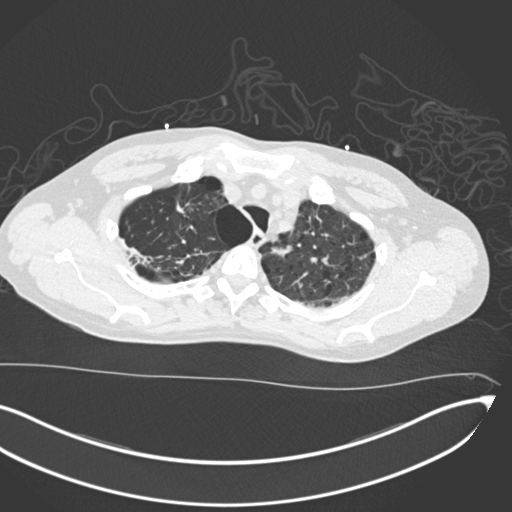
[im 147/175  lung]
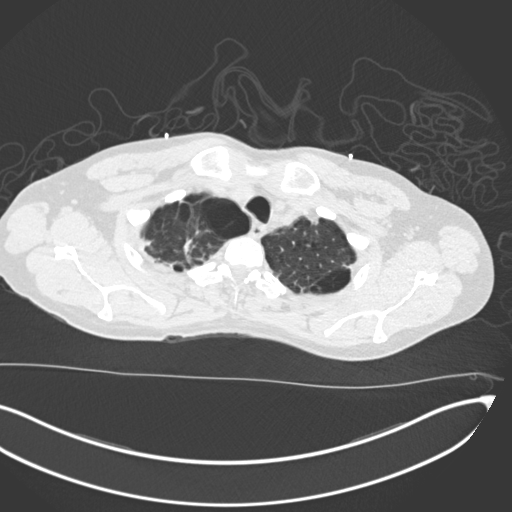
[im 165/175  lung]
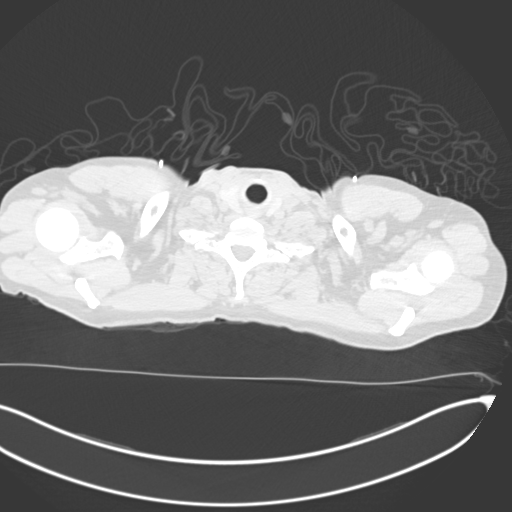

[Series 6: coronal · coronal · 0.71mm/px · 3 of 101 slices shown]
[im 21/101  lung]
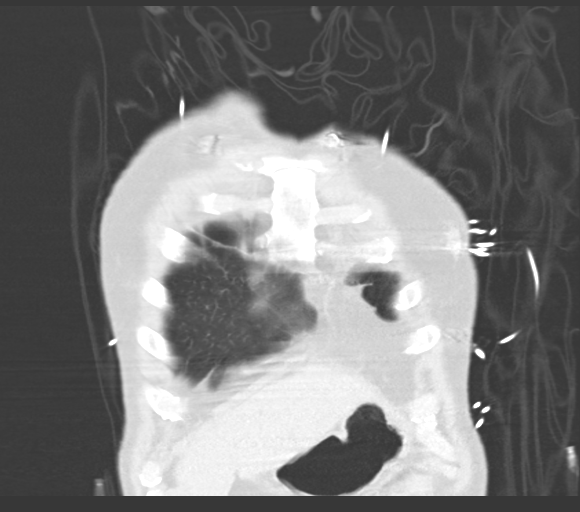
[im 41/101  lung]
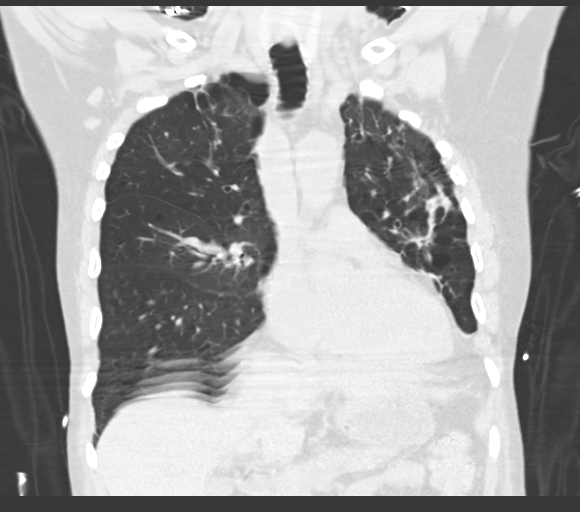
[im 61/101  lung]
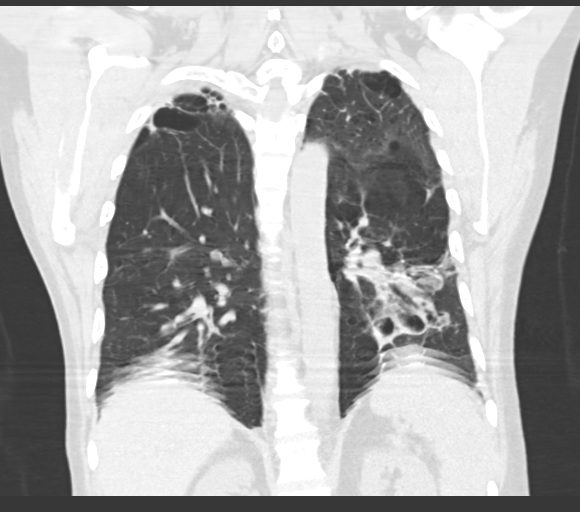

[15 of 36 positions shown; findings below may reference images not displayed]

FINDINGS: Cardiovascular: No significant vascular findings. Normal heart size.
No pericardial effusion.

Mediastinum/Nodes: There is a small hiatal hernia. Esophagus is
nondilated. There are no enlarged mediastinal or hilar lymph nodes.
Visualized thyroid gland is within normal limits.

Lungs/Pleura: Severe emphysematous changes are again seen.
Pleural-parenchymal scarring in the upper lungs appears stable.
Focal density in the left upper lobe measuring 1.3 x 1.5 cm image
8/64 appears unchanged. Cluster of slightly nodular airspace
opacities in the superior segment of the left lower lobe image 8/78
also appears unchanged. Again seen is bronchiectasis throughout the
left lower lobe. There are increasing air-fluid levels throughout
dilated bronchi throughout the left lower lobe. There is bibasilar
atelectasis or scarring. There is no lung consolidation, pleural
effusion or pneumothorax.

Upper Abdomen: No acute abnormality.

Musculoskeletal: No chest wall mass or suspicious bone lesions
identified.
IMPRESSION: 1. Chronic bronchiectasis in the left lower lobe is again seen, but
there are new air-fluid levels throughout dilated bronchi in this
region worrisome for infection. There is no lung consolidation.
2. Otherwise stable appearance of chronic lung disease and
emphysema.

## 2023-06-08 ENCOUNTER — Other Ambulatory Visit (HOSPITAL_COMMUNITY): Payer: Self-pay

## 2023-06-09 ENCOUNTER — Encounter: Payer: Self-pay | Admitting: Student

## 2023-06-09 ENCOUNTER — Ambulatory Visit: Payer: 59 | Admitting: Student

## 2023-06-09 VITALS — BP 128/82 | HR 93 | Temp 97.5°F | Ht 68.9 in | Wt 185.4 lb

## 2023-06-09 DIAGNOSIS — B20 Human immunodeficiency virus [HIV] disease: Secondary | ICD-10-CM | POA: Diagnosis not present

## 2023-06-09 DIAGNOSIS — J449 Chronic obstructive pulmonary disease, unspecified: Secondary | ICD-10-CM | POA: Diagnosis not present

## 2023-06-09 DIAGNOSIS — I5032 Chronic diastolic (congestive) heart failure: Secondary | ICD-10-CM | POA: Diagnosis not present

## 2023-06-09 MED ORDER — ALBUTEROL SULFATE HFA 108 (90 BASE) MCG/ACT IN AERS: 2.0000 | INHALATION_SPRAY | Freq: Four times a day (QID) | RESPIRATORY_TRACT | 2 refills | Status: DC | PRN

## 2023-06-09 MED ORDER — BREZTRI AEROSPHERE 160-9-4.8 MCG/ACT IN AERO: 1.0000 | INHALATION_SPRAY | Freq: Two times a day (BID) | RESPIRATORY_TRACT | 5 refills | Status: DC

## 2023-06-09 MED ORDER — BENZONATATE 100 MG PO CAPS: 100.0000 mg | ORAL_CAPSULE | Freq: Three times a day (TID) | ORAL | 0 refills | Status: DC | PRN

## 2023-06-09 NOTE — Patient Instructions (Addendum)
 Stop doxycyline and prednisone  Continue symtuza, tivicay, bactrim, breztri.  I will send a medicine to help with your cough that you take three times daily as needed  Your next appointment with infectious disease is on June 4 at 945 am  Return here 3 months     Acha doxycyline na prednisone  [place] symtuza, tivicay, bactrim, breztri.  Nitakutumia dawa ya kukusaidia kikohozi chako ambacho unakunywa mara tatu kila siku inavyohitajika  Miadi yako inayofuata kuhusu ugonjwa wa [place] ni tarehe 4 Juni saa 945 asubuhi  Rudia baada ya miezi 3

## 2023-06-09 NOTE — Assessment & Plan Note (Signed)
 Originally, this appointment was made 2 months ago to follow-up on his blood pressure and address his heart failure, if needed.  It appears he has heart failure with recovered ejection fraction, most recent EF 50% in 2023.  Today he does not have symptoms of heart failure.  He is not on GDMT.  His only blood pressure medicine is losartan 25 daily.  Given recovery without GDMT, I do not believe any initiation is indicated at this time.  Can consider repeat echo in future particularly if symptomatic, but I feel he is stable at this time. -Continue losartan 25 daily.  States blood pressure is 128/82.

## 2023-06-09 NOTE — Progress Notes (Signed)
 CC: HFU COPD exacerbation  HPI:  [name] is a 53 y.o. male with a PMH stated below who presents today for hospital follow up. He is without complaint. His only symptom is a lingering cough that he feels is improving. He does not have fevers, chills, dyspnea, sputum.  Please see problem based assessment and plan for additional details.  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  Vitals:   06/09/23 0942  BP: 128/82  Pulse: 93  Temp: (!) 97.5 F (36.4 C)  TempSrc: Oral  SpO2: 93%  Weight: 185 lb 6.4 oz (84.1 kg)  Height: 5' 8.9" (1.75 m)    Physical Exam: Constitutional: well-appearing man in no acute distress Cardiovascular: regular rate and rhythm, no m/r/g Pulmonary/Chest: normal work of breathing on room air, lungs with mild wheeze in the bases and referred sounds of upper airway congestion. Abdominal: soft, non-tender, non-distended MSK: normal  bulk and tone Skin: warm and dry Psych: normal mood and behavior  Assessment & Plan:   Patient discussed with Dr. Sol Blazing  COPD (chronic obstructive pulmonary disease) [facility]) He is here for hospital follow-up of his COPD exacerbation.  Today he reports he is doing well.  He does have a lingering cough and some mild wheeziness in the bases.  However he is not hypoxic.  Today he completed prednisone and doxycycline.  He feels that he is improving and on my examination I agree.  Vitals are stable.  He uses oxygen at home intermittently but has actually been out over the last couple days and has not felt bad without it.  Will attempt to refill. - I will refill his Breztri triple therapy and his albuterol - Tessalon Perles as needed for cough - DME referral to refill home oxygen.  He is not on oxygen in the office and is doing well on room air.  HIV disease ([name]) He follows with Dr. [name] in the ID clinic.  Next appointment is in June.  He reports adherence to his ART.  He is also adherent to his Bactrim which he takes daily. - Continue villotubular vera 50 daily - Continue Symtuza 800 - 150 - 200 - 10 daily - Continue Bactrim-160 daily - Follow-up ID in June  Heart failure with recovered ejection fraction (HFrecEF) ([name]) Originally, this appointment was made 2 months ago to follow-up on his blood pressure and address his heart failure, if needed.  It appears he  has heart failure with recovered ejection fraction, most recent EF 50% in 2023.  Today he does not have symptoms of heart failure.  He is not on GDMT.  His only blood pressure medicine is losartan 25 daily.  Given recovery without GDMT, I do not believe any initiation is indicated at this time.  Can consider repeat echo in future particularly if symptomatic, but I feel he is stable at this time. -Continue losartan 25 daily.  States blood pressure is 128/82.  RTC in 3 months for general checkup  [name], D.O. [facility] Internal  Medicine, PGY-1 Phone: [phone] Date 06/09/2023 Time 12:11 PM

## 2023-06-09 NOTE — Assessment & Plan Note (Signed)
 He follows with Dr. [name] in the ID clinic.  Next appointment is in June.  He reports adherence to his ART.  He is also adherent to his Bactrim which he takes daily. - Continue villotubular vera 50 daily - Continue Symtuza 800 - 150 - 200 - 10 daily - Continue Bactrim-160 daily - Follow-up ID in June

## 2023-06-09 NOTE — Assessment & Plan Note (Signed)
 He is here for hospital follow-up of his COPD exacerbation.  Today he reports he is doing well.  He does have a lingering cough and some mild wheeziness in the bases.  However he is not hypoxic.  Today he completed prednisone and doxycycline.  He feels that he is improving and on my examination I agree.  Vitals are stable.  He uses oxygen at home intermittently but has actually been out over the last couple days and has not felt bad without it.  Will attempt to refill. - I will refill his Breztri triple therapy and his albuterol - Tessalon Perles as needed for cough - DME referral to refill home oxygen.  He is not on oxygen in the office and is doing well on room air.

## 2023-06-10 ENCOUNTER — Other Ambulatory Visit (HOSPITAL_COMMUNITY): Payer: Self-pay

## 2023-06-10 MED ORDER — ALBUTEROL SULFATE HFA 108 (90 BASE) MCG/ACT IN AERS: 2.0000 | INHALATION_SPRAY | Freq: Four times a day (QID) | RESPIRATORY_TRACT | 2 refills | Status: DC | PRN

## 2023-06-10 MED ORDER — BREZTRI AEROSPHERE 160-9-4.8 MCG/ACT IN AERO: 1.0000 | INHALATION_SPRAY | Freq: Two times a day (BID) | RESPIRATORY_TRACT | 5 refills | Status: DC

## 2023-06-10 MED ORDER — BENZONATATE 100 MG PO CAPS: 100.0000 mg | ORAL_CAPSULE | Freq: Three times a day (TID) | ORAL | 0 refills | Status: DC | PRN

## 2023-06-10 NOTE — Progress Notes (Addendum)
 [facility] Clinic Attending  Case discussed with the resident at the time of the visit.  We reviewed the resident's history and exam and pertinent patient test results.  I agree with the assessment, diagnosis, and plan of care documented in the resident's note.  Discuss addition of SGLT2i for history of HF at next visit.

## 2023-06-10 NOTE — Addendum Note (Signed)
 Addended by: Katheran James on: 06/10/2023 01:59 PM   Modules accepted: Orders

## 2023-08-13 NOTE — Progress Notes (Signed)
 The 10-year ASCVD risk score (Arnett DK, et al., 2019) is: 8.6%   Values used to calculate the score:     Age: 53 years     Sex: Male     Is Non-Hispanic African American: No     Diabetic: Yes     Tobacco smoker: No     Systolic Blood Pressure: 128 mmHg     Is BP treated: Yes     HDL Cholesterol: 52 mg/dL     Total Cholesterol: 194 mg/dL  Currently prescribed rosuvastatin  20 mg.  Enez Monahan, BSN, RN

## 2023-08-14 ENCOUNTER — Inpatient Hospital Stay (HOSPITAL_COMMUNITY)
Admission: EM | Admit: 2023-08-14 | Discharge: 2023-08-21 | DRG: 974 | Disposition: A | Discharge status: Home / Self Care | Attending: Internal Medicine | Admitting: Internal Medicine

## 2023-08-14 ENCOUNTER — Other Ambulatory Visit: Payer: Self-pay

## 2023-08-14 ENCOUNTER — Emergency Department (HOSPITAL_COMMUNITY)

## 2023-08-14 DIAGNOSIS — Z789 Other specified health status: Secondary | ICD-10-CM

## 2023-08-14 DIAGNOSIS — I444 Left anterior fascicular block: Secondary | ICD-10-CM | POA: Diagnosis present

## 2023-08-14 DIAGNOSIS — Z7984 Long term (current) use of oral hypoglycemic drugs: Secondary | ICD-10-CM

## 2023-08-14 DIAGNOSIS — I5022 Chronic systolic (congestive) heart failure: Secondary | ICD-10-CM | POA: Diagnosis present

## 2023-08-14 DIAGNOSIS — E871 Hypo-osmolality and hyponatremia: Secondary | ICD-10-CM | POA: Diagnosis present

## 2023-08-14 DIAGNOSIS — Z86718 Personal history of other venous thrombosis and embolism: Secondary | ICD-10-CM

## 2023-08-14 DIAGNOSIS — I5042 Chronic combined systolic (congestive) and diastolic (congestive) heart failure: Secondary | ICD-10-CM | POA: Diagnosis present

## 2023-08-14 DIAGNOSIS — J449 Chronic obstructive pulmonary disease, unspecified: Secondary | ICD-10-CM

## 2023-08-14 DIAGNOSIS — J189 Pneumonia, unspecified organism: Principal | ICD-10-CM | POA: Diagnosis present

## 2023-08-14 DIAGNOSIS — Z8611 Personal history of tuberculosis: Secondary | ICD-10-CM

## 2023-08-14 DIAGNOSIS — J9621 Acute and chronic respiratory failure with hypoxia: Principal | ICD-10-CM | POA: Diagnosis present

## 2023-08-14 DIAGNOSIS — B20 Human immunodeficiency virus [HIV] disease: Secondary | ICD-10-CM | POA: Diagnosis present

## 2023-08-14 DIAGNOSIS — Z85038 Personal history of other malignant neoplasm of large intestine: Secondary | ICD-10-CM

## 2023-08-14 DIAGNOSIS — Z9981 Dependence on supplemental oxygen: Secondary | ICD-10-CM

## 2023-08-14 DIAGNOSIS — N1831 Chronic kidney disease, stage 3a: Secondary | ICD-10-CM

## 2023-08-14 DIAGNOSIS — J470 Bronchiectasis with acute lower respiratory infection: Secondary | ICD-10-CM | POA: Diagnosis present

## 2023-08-14 DIAGNOSIS — Z87891 Personal history of nicotine dependence: Secondary | ICD-10-CM

## 2023-08-14 DIAGNOSIS — E785 Hyperlipidemia, unspecified: Secondary | ICD-10-CM | POA: Diagnosis present

## 2023-08-14 DIAGNOSIS — Z8249 Family history of ischemic heart disease and other diseases of the circulatory system: Secondary | ICD-10-CM

## 2023-08-14 DIAGNOSIS — E1122 Type 2 diabetes mellitus with diabetic chronic kidney disease: Secondary | ICD-10-CM | POA: Diagnosis present

## 2023-08-14 DIAGNOSIS — Z86711 Personal history of pulmonary embolism: Secondary | ICD-10-CM

## 2023-08-14 DIAGNOSIS — Z1152 Encounter for screening for COVID-19: Secondary | ICD-10-CM

## 2023-08-14 DIAGNOSIS — J432 Centrilobular emphysema: Secondary | ICD-10-CM | POA: Diagnosis present

## 2023-08-14 DIAGNOSIS — Z888 Allergy status to other drugs, medicaments and biological substances status: Secondary | ICD-10-CM

## 2023-08-14 DIAGNOSIS — N179 Acute kidney failure, unspecified: Secondary | ICD-10-CM | POA: Diagnosis present

## 2023-08-14 DIAGNOSIS — E1110 Type 2 diabetes mellitus with ketoacidosis without coma: Secondary | ICD-10-CM | POA: Diagnosis present

## 2023-08-14 DIAGNOSIS — R059 Cough, unspecified: Secondary | ICD-10-CM

## 2023-08-14 DIAGNOSIS — Z7901 Long term (current) use of anticoagulants: Secondary | ICD-10-CM

## 2023-08-14 DIAGNOSIS — J441 Chronic obstructive pulmonary disease with (acute) exacerbation: Secondary | ICD-10-CM | POA: Diagnosis present

## 2023-08-14 DIAGNOSIS — R000 Tachycardia, unspecified: Secondary | ICD-10-CM | POA: Diagnosis present

## 2023-08-14 DIAGNOSIS — J440 Chronic obstructive pulmonary disease with acute lower respiratory infection: Secondary | ICD-10-CM | POA: Diagnosis present

## 2023-08-14 DIAGNOSIS — J439 Emphysema, unspecified: Secondary | ICD-10-CM | POA: Diagnosis present

## 2023-08-14 DIAGNOSIS — Z794 Long term (current) use of insulin: Secondary | ICD-10-CM

## 2023-08-14 DIAGNOSIS — B379 Candidiasis, unspecified: Secondary | ICD-10-CM | POA: Diagnosis present

## 2023-08-14 DIAGNOSIS — Z79899 Other long term (current) drug therapy: Secondary | ICD-10-CM

## 2023-08-14 DIAGNOSIS — F32A Depression, unspecified: Secondary | ICD-10-CM | POA: Diagnosis present

## 2023-08-14 DIAGNOSIS — R0602 Shortness of breath: Principal | ICD-10-CM

## 2023-08-14 DIAGNOSIS — Z7952 Long term (current) use of systemic steroids: Secondary | ICD-10-CM

## 2023-08-14 DIAGNOSIS — E1169 Type 2 diabetes mellitus with other specified complication: Secondary | ICD-10-CM

## 2023-08-14 LAB — CBC WITH DIFFERENTIAL/PLATELET
Abs Immature Granulocytes: 0.07 10*3/uL (ref 0.00–0.07)
Basophils Absolute: 0 10*3/uL (ref 0.0–0.1)
Basophils Relative: 0 %
Eosinophils Absolute: 0 10*3/uL (ref 0.0–0.5)
Eosinophils Relative: 0 %
HCT: 43.6 % (ref 39.0–52.0)
Hemoglobin: 14.7 g/dL (ref 13.0–17.0)
Immature Granulocytes: 1 %
Lymphocytes Relative: 18 %
Lymphs Abs: 1.7 10*3/uL (ref 0.7–4.0)
MCH: 31.7 pg (ref 26.0–34.0)
MCHC: 33.7 g/dL (ref 30.0–36.0)
MCV: 94.2 fL (ref 80.0–100.0)
Monocytes Absolute: 0.5 10*3/uL (ref 0.1–1.0)
Monocytes Relative: 6 %
Neutro Abs: 6.8 10*3/uL (ref 1.7–7.7)
Neutrophils Relative %: 75 %
Platelets: 211 10*3/uL (ref 150–400)
RBC: 4.63 MIL/uL (ref 4.22–5.81)
RDW: 11.3 % — ABNORMAL LOW (ref 11.5–15.5)
WBC: 9.1 10*3/uL (ref 4.0–10.5)
nRBC: 0 % (ref 0.0–0.2)

## 2023-08-14 LAB — COMPREHENSIVE METABOLIC PANEL WITH GFR
ALT: 18 U/L (ref 0–44)
AST: 15 U/L (ref 15–41)
Albumin: 3 g/dL — ABNORMAL LOW (ref 3.5–5.0)
Alkaline Phosphatase: 84 U/L (ref 38–126)
Anion gap: 16 — ABNORMAL HIGH (ref 5–15)
BUN: 30 mg/dL — ABNORMAL HIGH (ref 6–20)
CO2: 25 mmol/L (ref 22–32)
Calcium: 10 mg/dL (ref 8.9–10.3)
Chloride: 89 mmol/L — ABNORMAL LOW (ref 98–111)
Creatinine, Ser: 1.59 mg/dL — ABNORMAL HIGH (ref 0.61–1.24)
GFR, Estimated: 52 mL/min — ABNORMAL LOW (ref >60)
Glucose, Bld: 599 mg/dL (ref 70–99)
Potassium: 4.4 mmol/L (ref 3.5–5.1)
Sodium: 130 mmol/L — ABNORMAL LOW (ref 135–145)
Total Bilirubin: 1 mg/dL (ref 0.0–1.2)
Total Protein: 8.8 g/dL — ABNORMAL HIGH (ref 6.5–8.1)

## 2023-08-14 LAB — D-DIMER, QUANTITATIVE: D-Dimer, Quant: 0.59 ug{FEU}/mL — ABNORMAL HIGH (ref 0.00–0.50)

## 2023-08-14 LAB — TROPONIN I (HIGH SENSITIVITY): Troponin I (High Sensitivity): 8 ng/L (ref <18)

## 2023-08-14 LAB — BRAIN NATRIURETIC PEPTIDE: B Natriuretic Peptide: 12.9 pg/mL (ref 0.0–100.0)

## 2023-08-14 LAB — I-STAT CG4 LACTIC ACID, ED: Lactic Acid, Venous: 1.4 mmol/L (ref 0.5–1.9)

## 2023-08-14 MED ORDER — SODIUM CHLORIDE 0.9 % IV BOLUS
1000.0000 mL | Freq: Once | INTRAVENOUS | Status: AC
  Administered 2023-08-14: 1000 mL via INTRAVENOUS

## 2023-08-14 MED ORDER — SODIUM CHLORIDE 0.9 % IV SOLN
1.0000 g | Freq: Once | INTRAVENOUS | Status: AC
  Administered 2023-08-14: 1 g via INTRAVENOUS
  Filled 2023-08-14: qty 10

## 2023-08-14 MED ORDER — SODIUM CHLORIDE 0.9 % IV SOLN
500.0000 mg | Freq: Once | INTRAVENOUS | Status: AC
  Administered 2023-08-15: 500 mg via INTRAVENOUS
  Filled 2023-08-14: qty 5

## 2023-08-14 MED ORDER — METHYLPREDNISOLONE SODIUM SUCC 125 MG IJ SOLR: 125.0000 mg | Freq: Once | INTRAMUSCULAR | Status: DC

## 2023-08-14 MED ORDER — MAGNESIUM SULFATE 2 GM/50ML IV SOLN
2.0000 g | Freq: Once | INTRAVENOUS | Status: AC
  Administered 2023-08-14: 2 g via INTRAVENOUS
  Filled 2023-08-14: qty 50

## 2023-08-14 MED ORDER — IPRATROPIUM-ALBUTEROL 0.5-2.5 (3) MG/3ML IN SOLN
3.0000 mL | Freq: Once | RESPIRATORY_TRACT | Status: AC
  Administered 2023-08-14: 3 mL via RESPIRATORY_TRACT
  Filled 2023-08-14: qty 3

## 2023-08-14 NOTE — ED Provider Notes (Signed)
 [place] [facility] Provider Note   CSN: [id number] Arrival date & time: 08/14/23  2109     History {Add pertinent medical, surgical, social history, OB history to HPI:1} Chief Complaint  Patient presents with   Shortness of Breath   HPI [name] P Ladnier is a 53 y.o. male with COPD, HIV, CHF, type 2 diabetes, history of PE presenting for shortness of breath and cough.  States he is always short of breath but noticeably worse in the last 4 days.  Also endorsing a nonproductive cough as well and chills but no fever.  He states he has chest pain all over his chest that is made worse with coughing.  Does not radiate otherwise.  He mentioned that at times he does require oxygen at home but unsure how much.  Shortness of breath is worse with exertion.  EMS called to his house.  Given a breathing treatment and Solu-Medrol  and he does state that he is feeling better. Also reports that he has been vomiting but denies abdominal pain and diarrhea.  Normal bowel movement yesterday.   Shortness of Breath      Home Medications Prior to Admission medications   Medication Sig Start Date End Date Taking? Authorizing Provider  albuterol  (VENTOLIN  HFA) 108 (90 Base) MCG/ACT inhaler Inhale 2 puffs into the lungs every 6 (six) hours as needed for wheezing or shortness of breath. 06/10/23   Carleen Chary, DO  benzonatate  (TESSALON  PERLES) 100 MG capsule Take 1 capsule (100 mg total) by mouth 3 (three) times daily as needed for cough. 06/10/23   Carleen Chary, DO  budeson-glycopyrrolate -formoterol  (BREZTRI  AEROSPHERE) 160-9-4.8 MCG/ACT AERO Inhale 1 puff into the lungs 2 (two) times daily. 06/10/23   Carleen Chary, DO  Darunavir -Cobicistat -Emtricitabine -Tenofovir  Alafenamide (SYMTUZA ) 800-150-200-10 MG TABS Take 1 tablet by mouth daily with breakfast. 05/18/23   Liane Redman, MD  dolutegravir  (TIVICAY ) 50 MG tablet Take 1 tablet (50 mg total) by mouth  daily. 05/18/23   Liane Redman, MD  doxycycline  (VIBRA -TABS) 100 MG tablet Take 1 tablet (100 mg total) by mouth every 12 (twelve) hours. 06/05/23   Ronni Colace, DO  losartan  (COZAAR ) 25 MG tablet Take 1 tablet (25 mg total) by mouth daily. 03/22/23 03/21/24  Jackolyn Masker, MD  predniSONE  (DELTASONE ) 20 MG tablet Take 2 tablets (40 mg total) by mouth daily with breakfast. 06/06/23   Ronni Colace, DO  rosuvastatin  (CRESTOR ) 20 MG tablet Take 1 tablet (20 mg total) by mouth daily. 03/22/23 03/21/24  Jackolyn Masker, MD  sulfamethoxazole -trimethoprim  (BACTRIM  DS) 800-160 MG tablet Take 1 tablet by mouth daily. 05/18/23   Liane Redman, MD  triamcinolone  ointment (KENALOG ) 0.1 % Apply 1 Application topically 2 (two) times daily. Patient taking differently: Apply 1 Application topically 2 (two) times daily as needed (rash). 02/15/23   Liane Redman, MD      Allergies    Metformin  and related    Review of Systems   Review of Systems  Respiratory:  Positive for shortness of breath.     Physical Exam Updated Vital Signs BP 124/88   Pulse (!) 113   Temp 98.8 F (37.1 C)   Resp 20   Ht 5\' 8"  (1.727 m)   Wt 84 kg   SpO2 95%   BMI 28.16 kg/m  Physical Exam  ED Results / Procedures / Treatments   Labs (all labs ordered are listed, but only abnormal results are displayed) Labs Reviewed  RESP PANEL BY RT-PCR (RSV, FLU A&B,  COVID)  RVPGX2  CULTURE, BLOOD (ROUTINE X 2)  CULTURE, BLOOD (ROUTINE X 2)  CBC WITH DIFFERENTIAL/PLATELET  COMPREHENSIVE METABOLIC PANEL WITH GFR  I-STAT CG4 LACTIC ACID, ED    EKG None  Radiology No results found.  Procedures Procedures  {Document cardiac monitor, telemetry assessment procedure when appropriate:1}  Medications Ordered in ED Medications  magnesium  sulfate IVPB 2 g 50 mL (has no administration in time range)  ipratropium-albuterol  ([name]) 0.5-2.5 (3) MG/3ML nebulizer solution 3 mL (3 mLs Nebulization Given 08/14/23 2154)    ED Course/ Medical  Decision Making/ A&P   {   Click here for ABCD2, HEART and other calculatorsREFRESH Note before signing :1}                              Medical Decision Making Amount and/or Complexity of Data Reviewed Labs: ordered. Radiology: ordered.  Risk Prescription drug management.   ***  {Document critical care time when appropriate:1} {Document review of labs and clinical decision tools ie heart score, Chads2Vasc2 etc:1}  {Document your independent review of radiology images, and any outside records:1} {Document your discussion with family members, caretakers, and with consultants:1} {Document social determinants of health affecting pt's care:1} {Document your decision making why or why not admission, treatments were needed:1} Final Clinical Impression(s) / ED Diagnoses Final diagnoses:  None    Rx / DC Orders ED Discharge Orders     None

## 2023-08-14 NOTE — ED Notes (Signed)
 Pt placed on 2L [place] due to sats mid 80s.

## 2023-08-14 NOTE — ED Provider Notes (Incomplete)
 [place] [facility] AT [facility] Provider Note   CSN: [id number] Arrival date & time: 08/14/23  2109     History {Add pertinent medical, surgical, social history, OB history to HPI:1} Chief Complaint  Patient presents with  . Shortness of Breath   HPI [name] is a 53 y.o. male with COPD with oxygen requirenment (2, HIV, CHF, type 2 diabetes, history of PE presenting for shortness of breath and cough.  States he is always short of breath but noticeably worse in the last 4 days.  Also endorsing a nonproductive cough as well and chills but no fever.  He states he has chest pain all over his chest that is made worse with coughing.  Does not radiate otherwise.  He mentioned that at times he does require oxygen at home but unsure how much.  Shortness of breath is worse with exertion.  EMS called to his house. Given a breathing treatment and Solu-Medrol  and he does state that he is feeling better. Also reports that he has been vomiting but denies abdominal pain and diarrhea.  Normal bowel movement yesterday.   Shortness of Breath      Home Medications Prior to Admission medications   Medication Sig Start Date End Date Taking? Authorizing Provider  albuterol  (VENTOLIN  HFA) 108 (90 Base) MCG/ACT inhaler Inhale 2 puffs into the lungs every 6 (six) hours as needed for wheezing or shortness of breath. 06/10/23   Juberg, Veryl Gottron, DO  benzonatate  (TESSALON  PERLES) 100 MG capsule Take 1 capsule (100 mg total) by mouth 3 (three) times daily as needed for cough. 06/10/23   Carleen Chary, DO  budeson-glycopyrrolate -formoterol  (BREZTRI  AEROSPHERE) 160-9-4.8 MCG/ACT AERO Inhale 1 puff into the lungs 2 (two) times daily. 06/10/23   Carleen Chary, DO  Darunavir -Cobicistat -Emtricitabine -Tenofovir  Alafenamide (SYMTUZA ) 800-150-200-10 MG TABS Take 1 tablet by mouth daily with breakfast. 05/18/23   Liane Redman, MD  dolutegravir  (TIVICAY ) 50 MG tablet Take 1  tablet (50 mg total) by mouth daily. 05/18/23   Liane Redman, MD  doxycycline  (VIBRA -TABS) 100 MG tablet Take 1 tablet (100 mg total) by mouth every 12 (twelve) hours. 06/05/23   Ronni Colace, DO  losartan  (COZAAR ) 25 MG tablet Take 1 tablet (25 mg total) by mouth daily. 03/22/23 03/21/24  Jackolyn Masker, MD  predniSONE  (DELTASONE ) 20 MG tablet Take 2 tablets (40 mg total) by mouth daily with breakfast. 06/06/23   Ronni Colace, DO  rosuvastatin  (CRESTOR ) 20 MG tablet Take 1 tablet (20 mg total) by mouth daily. 03/22/23 03/21/24  Jackolyn Masker, MD  sulfamethoxazole -trimethoprim  (BACTRIM  DS) 800-160 MG tablet Take 1 tablet by mouth daily. 05/18/23   Liane Redman, MD  triamcinolone  ointment (KENALOG ) 0.1 % Apply 1 Application topically 2 (two) times daily. Patient taking differently: Apply 1 Application topically 2 (two) times daily as needed (rash). 02/15/23   Liane Redman, MD      Allergies    Metformin  and related    Review of Systems   Review of Systems  Respiratory:  Positive for shortness of breath.     Physical Exam   Vitals:   08/14/23 2315 08/14/23 2331  BP: 137/84   Pulse: (!) 103   Resp: 20   Temp:    SpO2: 91% 94%    CONSTITUTIONAL:  well-appearing, NAD NEURO:  Alert and oriented x 3, CN 3-12 grossly intact EYES:  eyes equal and reactive ENT/NECK:  Supple, no stridor  CARDIO:  tachycardia, regular rhythm, appears well-perfused  PULM:  Hypoxic (high 80s),  mildly tachypneic, diffuse expiratory wheezing, no rales GI/GU:  non-distended, soft, non tender MSK/SPINE:  No gross deformities, no edema, moves all extremities  SKIN:  no rash, atraumatic  *Additional and/or pertinent findings included in MDM below   ED Results / Procedures / Treatments   Labs (all labs ordered are listed, but only abnormal results are displayed) Labs Reviewed  CBC WITH DIFFERENTIAL/PLATELET - Abnormal; Notable for the following components:      Result Value   RDW 11.3 (*)    All other components  within normal limits  COMPREHENSIVE METABOLIC PANEL WITH GFR - Abnormal; Notable for the following components:   Sodium 130 (*)    Chloride 89 (*)    Glucose, Bld 599 (*)    BUN 30 (*)    Creatinine, Ser 1.59 (*)    Total Protein 8.8 (*)    Albumin 3.0 (*)    GFR, Estimated 52 (*)    Anion gap 16 (*)    All other components within normal limits  D-DIMER, QUANTITATIVE - Abnormal; Notable for the following components:   D-Dimer, Quant 0.59 (*)    All other components within normal limits  RESP PANEL BY RT-PCR (RSV, FLU A&B, COVID)  RVPGX2  CULTURE, BLOOD (ROUTINE X 2)  CULTURE, BLOOD (ROUTINE X 2)  BRAIN NATRIURETIC PEPTIDE  I-STAT CG4 LACTIC ACID, ED  I-STAT CG4 LACTIC ACID, ED  CBG MONITORING, ED  I-STAT VENOUS BLOOD GAS, ED  TROPONIN I (HIGH SENSITIVITY)  TROPONIN I (HIGH SENSITIVITY)    EKG EKG Interpretation Date/Time:  Saturday Aug 14 2023 21:50:47 EDT Ventricular Rate:  109 PR Interval:  145 QRS Duration:  94 QT Interval:  336 QTC Calculation: 453 R Axis:   -86  Text Interpretation: Sinus tachycardia Left anterior fascicular block Abnormal R-wave progression, late transition Borderline T abnormalities, anterior leads Confirmed by Eldon Greenland ([id number]) on 08/14/2023 11:47:28 PM  Radiology DG Chest Port 1 View Result Date: 08/14/2023 EXAM: 1 VIEW XRAY OF THE CHEST 08/14/2023 10:05:58 PM COMPARISON: 06/04/2023 CLINICAL HISTORY: shob. SHOB shob. SHOB FINDINGS: LUNGS AND PLEURA: Progressive patchy right lower lobe opacity, suspicious for pneumonia. Stable scarring / bronchiectasis in the left upper and lower lobes, chronic. No pulmonary edema. No pleural effusion. No pneumothorax. HEART AND MEDIASTINUM: No acute abnormality of the cardiac and mediastinal silhouettes. BONES AND SOFT TISSUES: No acute osseous abnormality. IMPRESSION: 1. Progressive patchy right lower lobe opacity, suspicious for pneumonia. 2. Stable scarring/bronchiectasis in the left upper and lower lobes.  Electronically signed by: Zadie Herter MD 08/14/2023 10:08 PM EDT RP Workstation: [id number]    Procedures .Critical Care  Performed by: Janalee Mcmurray, PA-C Authorized by: Janalee Mcmurray, PA-C   Critical care provider statement:    Critical care time (minutes):  30   Critical care was necessary to treat or prevent imminent or life-threatening deterioration of the following conditions:  Sepsis and respiratory failure   Critical care was time spent personally by me on the following activities:  Development of treatment plan with patient or surrogate, discussions with consultants, evaluation of patient's response to treatment, examination of patient, ordering and review of laboratory studies, ordering and review of radiographic studies, ordering and performing treatments and interventions, pulse oximetry, re-evaluation of patient's condition and review of old charts   {Document cardiac monitor, telemetry assessment procedure when appropriate:1}  Medications Ordered in ED Medications  azithromycin  (ZITHROMAX ) 500 mg in sodium chloride  0.9 % 250 mL IVPB (has no administration in time range)  ipratropium-albuterol  ([name])  0.5-2.5 (3) MG/3ML nebulizer solution 3 mL (3 mLs Nebulization Given 08/14/23 2154)  magnesium  sulfate IVPB 2 g 50 mL (0 g Intravenous Stopped 08/14/23 2304)  cefTRIAXone  (ROCEPHIN ) 1 g in sodium chloride  0.9 % 100 mL IVPB (0 g Intravenous Stopped 08/15/23 0001)  sodium chloride  0.9 % bolus 1,000 mL (1,000 mLs Intravenous New Bag/Given 08/14/23 2331)    ED Course/ Medical Decision Making/ A&P   {   Click here for ABCD2, HEART and other calculatorsREFRESH Note before signing :1}                              Medical Decision Making Amount and/or Complexity of Data Reviewed Labs: ordered. Radiology: ordered.  Risk Prescription drug management.   Initial Impression and Ddx 53 yo well appearing male presenting for shortness of breath.  Exam notable for diffuse  wheezing and coarse breath sounds.  DDx includes COPD exacerbation, pneumonia, sepsis, PE, ACS, intra-abdominal infection, electrolyte derangement, other. Patient PMH that increases complexity of ED encounter:  COPD , HIV, CHF, type 2 diabetes, history of PE  Interpretation of Diagnostics - I independent reviewed and interpreted the labs as followed: hyperglycemia, elevated anion gap, elevated d dimer  - I independently visualized the following imaging with scope of interpretation limited to determining acute life threatening conditions related to emergency care: CXR, which revealed findings suggest of right lower lobe pneumonia  - I personally reviewed and interpreted EKG which revealed sinus tachycardia  Patient Reassessment and Ultimate Disposition/Management On reassessment  Patient management required discussion with the following services or consulting groups:  {BEROCONSULT:26841}  Complexity of Problems Addressed {BEROCOPA:26833}  Additional Data Reviewed and Analyzed Further history obtained from: {BERODATA:26834}  Patient Encounter Risk Assessment {BERORISK:26838}   {Document critical care time when appropriate:1} {Document review of labs and clinical decision tools ie heart score, Chads2Vasc2 etc:1}  {Document your independent review of radiology images, and any outside records:1} {Document your discussion with family members, caretakers, and with consultants:1} {Document social determinants of health affecting pt's care:1} {Document your decision making why or why not admission, treatments were needed:1} Final Clinical Impression(s) / ED Diagnoses Final diagnoses:  None    Rx / DC Orders ED Discharge Orders     None

## 2023-08-14 NOTE — ED Triage Notes (Signed)
 Hx of asthma and reports generally feeling unwell over the last few days. +wheezing. Received a neb with EMS and reports feels better.

## 2023-08-15 ENCOUNTER — Encounter (HOSPITAL_COMMUNITY): Payer: Self-pay | Admitting: Internal Medicine

## 2023-08-15 ENCOUNTER — Other Ambulatory Visit (HOSPITAL_COMMUNITY)

## 2023-08-15 DIAGNOSIS — J439 Emphysema, unspecified: Secondary | ICD-10-CM | POA: Diagnosis present

## 2023-08-15 DIAGNOSIS — E1169 Type 2 diabetes mellitus with other specified complication: Secondary | ICD-10-CM | POA: Diagnosis present

## 2023-08-15 DIAGNOSIS — Z794 Long term (current) use of insulin: Secondary | ICD-10-CM | POA: Diagnosis not present

## 2023-08-15 DIAGNOSIS — N1831 Chronic kidney disease, stage 3a: Secondary | ICD-10-CM

## 2023-08-15 DIAGNOSIS — J189 Pneumonia, unspecified organism: Secondary | ICD-10-CM | POA: Diagnosis present

## 2023-08-15 DIAGNOSIS — E785 Hyperlipidemia, unspecified: Secondary | ICD-10-CM

## 2023-08-15 DIAGNOSIS — I5022 Chronic systolic (congestive) heart failure: Secondary | ICD-10-CM

## 2023-08-15 DIAGNOSIS — J441 Chronic obstructive pulmonary disease with (acute) exacerbation: Secondary | ICD-10-CM

## 2023-08-15 DIAGNOSIS — J470 Bronchiectasis with acute lower respiratory infection: Secondary | ICD-10-CM | POA: Diagnosis present

## 2023-08-15 DIAGNOSIS — J9621 Acute and chronic respiratory failure with hypoxia: Secondary | ICD-10-CM | POA: Diagnosis present

## 2023-08-15 DIAGNOSIS — J440 Chronic obstructive pulmonary disease with acute lower respiratory infection: Secondary | ICD-10-CM | POA: Diagnosis present

## 2023-08-15 DIAGNOSIS — F32A Depression, unspecified: Secondary | ICD-10-CM | POA: Diagnosis present

## 2023-08-15 DIAGNOSIS — B370 Candidal stomatitis: Secondary | ICD-10-CM | POA: Diagnosis not present

## 2023-08-15 DIAGNOSIS — E1110 Type 2 diabetes mellitus with ketoacidosis without coma: Secondary | ICD-10-CM | POA: Diagnosis present

## 2023-08-15 DIAGNOSIS — M7989 Other specified soft tissue disorders: Secondary | ICD-10-CM | POA: Diagnosis not present

## 2023-08-15 DIAGNOSIS — E1122 Type 2 diabetes mellitus with diabetic chronic kidney disease: Secondary | ICD-10-CM | POA: Diagnosis present

## 2023-08-15 DIAGNOSIS — I5042 Chronic combined systolic (congestive) and diastolic (congestive) heart failure: Secondary | ICD-10-CM | POA: Diagnosis present

## 2023-08-15 DIAGNOSIS — J471 Bronchiectasis with (acute) exacerbation: Secondary | ICD-10-CM | POA: Diagnosis not present

## 2023-08-15 DIAGNOSIS — B379 Candidiasis, unspecified: Secondary | ICD-10-CM | POA: Diagnosis present

## 2023-08-15 DIAGNOSIS — I5032 Chronic diastolic (congestive) heart failure: Secondary | ICD-10-CM | POA: Diagnosis not present

## 2023-08-15 DIAGNOSIS — Z79899 Other long term (current) drug therapy: Secondary | ICD-10-CM | POA: Diagnosis not present

## 2023-08-15 DIAGNOSIS — J9690 Respiratory failure, unspecified, unspecified whether with hypoxia or hypercapnia: Secondary | ICD-10-CM | POA: Insufficient documentation

## 2023-08-15 DIAGNOSIS — Z8249 Family history of ischemic heart disease and other diseases of the circulatory system: Secondary | ICD-10-CM | POA: Diagnosis not present

## 2023-08-15 DIAGNOSIS — N179 Acute kidney failure, unspecified: Secondary | ICD-10-CM | POA: Diagnosis present

## 2023-08-15 DIAGNOSIS — E871 Hypo-osmolality and hyponatremia: Secondary | ICD-10-CM | POA: Diagnosis present

## 2023-08-15 DIAGNOSIS — Z87891 Personal history of nicotine dependence: Secondary | ICD-10-CM | POA: Diagnosis not present

## 2023-08-15 DIAGNOSIS — Z1152 Encounter for screening for COVID-19: Secondary | ICD-10-CM | POA: Diagnosis not present

## 2023-08-15 DIAGNOSIS — Z7901 Long term (current) use of anticoagulants: Secondary | ICD-10-CM | POA: Diagnosis not present

## 2023-08-15 DIAGNOSIS — Z9981 Dependence on supplemental oxygen: Secondary | ICD-10-CM | POA: Diagnosis not present

## 2023-08-15 DIAGNOSIS — B20 Human immunodeficiency virus [HIV] disease: Secondary | ICD-10-CM | POA: Diagnosis present

## 2023-08-15 LAB — BASIC METABOLIC PANEL WITH GFR
Anion gap: 16 — ABNORMAL HIGH (ref 5–15)
Anion gap: 11 (ref 5–15)
BUN: 32 mg/dL — ABNORMAL HIGH (ref 6–20)
BUN: 28 mg/dL — ABNORMAL HIGH (ref 6–20)
CO2: 22 mmol/L (ref 22–32)
CO2: 26 mmol/L (ref 22–32)
Calcium: 9.2 mg/dL (ref 8.9–10.3)
Calcium: 9.1 mg/dL (ref 8.9–10.3)
Chloride: 96 mmol/L — ABNORMAL LOW (ref 98–111)
Chloride: 97 mmol/L — ABNORMAL LOW (ref 98–111)
Creatinine, Ser: 1.63 mg/dL — ABNORMAL HIGH (ref 0.61–1.24)
Creatinine, Ser: 1.27 mg/dL — ABNORMAL HIGH (ref 0.61–1.24)
GFR, Estimated: 50 mL/min — ABNORMAL LOW (ref >60)
GFR, Estimated: >60 mL/min (ref >60)
Glucose, Bld: 578 mg/dL (ref 70–99)
Glucose, Bld: 329 mg/dL — ABNORMAL HIGH (ref 70–99)
Potassium: 5.1 mmol/L (ref 3.5–5.1)
Potassium: 4.7 mmol/L (ref 3.5–5.1)
Sodium: 134 mmol/L — ABNORMAL LOW (ref 135–145)
Sodium: 134 mmol/L — ABNORMAL LOW (ref 135–145)

## 2023-08-15 LAB — GLUCOSE, CAPILLARY
Glucose-Capillary: 386 mg/dL — ABNORMAL HIGH (ref 70–99)
Glucose-Capillary: 321 mg/dL — ABNORMAL HIGH (ref 70–99)
Glucose-Capillary: 280 mg/dL — ABNORMAL HIGH (ref 70–99)
Glucose-Capillary: 363 mg/dL — ABNORMAL HIGH (ref 70–99)
Glucose-Capillary: 413 mg/dL — ABNORMAL HIGH (ref 70–99)
Glucose-Capillary: 352 mg/dL — ABNORMAL HIGH (ref 70–99)
Glucose-Capillary: 486 mg/dL — ABNORMAL HIGH (ref 70–99)
Glucose-Capillary: 400 mg/dL — ABNORMAL HIGH (ref 70–99)

## 2023-08-15 LAB — I-STAT VENOUS BLOOD GAS, ED
Acid-Base Excess: 0 mmol/L (ref 0.0–2.0)
Bicarbonate: 24.8 mmol/L (ref 20.0–28.0)
Calcium, Ion: 1.16 mmol/L (ref 1.15–1.40)
HCT: 44 % (ref 39.0–52.0)
Hemoglobin: 15 g/dL (ref 13.0–17.0)
O2 Saturation: 93 %
Potassium: 4.7 mmol/L (ref 3.5–5.1)
Sodium: 130 mmol/L — ABNORMAL LOW (ref 135–145)
TCO2: 26 mmol/L (ref 22–32)
pCO2, Ven: 42.3 mmHg — ABNORMAL LOW (ref 44–60)
pH, Ven: 7.377 (ref 7.25–7.43)
pO2, Ven: 70 mmHg — ABNORMAL HIGH (ref 32–45)

## 2023-08-15 LAB — CBC
HCT: 41.6 % (ref 39.0–52.0)
Hemoglobin: 13.8 g/dL (ref 13.0–17.0)
MCH: 32.2 pg (ref 26.0–34.0)
MCHC: 33.2 g/dL (ref 30.0–36.0)
MCV: 97.2 fL (ref 80.0–100.0)
Platelets: 203 10*3/uL (ref 150–400)
RBC: 4.28 MIL/uL (ref 4.22–5.81)
RDW: 11.4 % — ABNORMAL LOW (ref 11.5–15.5)
WBC: 7.8 10*3/uL (ref 4.0–10.5)
nRBC: 0 % (ref 0.0–0.2)

## 2023-08-15 LAB — HEMOGLOBIN A1C
Hgb A1c MFr Bld: 10.8 % — ABNORMAL HIGH (ref 4.8–5.6)
Mean Plasma Glucose: 263.26 mg/dL

## 2023-08-15 LAB — CBG MONITORING, ED
Glucose-Capillary: 439 mg/dL — ABNORMAL HIGH (ref 70–99)
Glucose-Capillary: 448 mg/dL — ABNORMAL HIGH (ref 70–99)
Glucose-Capillary: 524 mg/dL (ref 70–99)
Glucose-Capillary: 540 mg/dL (ref 70–99)
Glucose-Capillary: 543 mg/dL (ref 70–99)
Glucose-Capillary: 560 mg/dL (ref 70–99)

## 2023-08-15 LAB — RESP PANEL BY RT-PCR (RSV, FLU A&B, COVID)  RVPGX2
Influenza A by PCR: NEGATIVE
Influenza B by PCR: NEGATIVE
Resp Syncytial Virus by PCR: NEGATIVE
SARS Coronavirus 2 by RT PCR: NEGATIVE

## 2023-08-15 LAB — BETA-HYDROXYBUTYRIC ACID: Beta-Hydroxybutyric Acid: 4.69 mmol/L — ABNORMAL HIGH (ref 0.05–0.27)

## 2023-08-15 MED ORDER — SODIUM CHLORIDE 0.9 % IV SOLN
1.0000 g | INTRAVENOUS | Status: AC
  Administered 2023-08-15 – 2023-08-19 (×5): 1 g via INTRAVENOUS
  Filled 2023-08-15 (×5): qty 10

## 2023-08-15 MED ORDER — IPRATROPIUM BROMIDE 0.02 % IN SOLN
0.5000 mg | Freq: Four times a day (QID) | RESPIRATORY_TRACT | Status: DC
  Administered 2023-08-15 (×2): 0.5 mg via RESPIRATORY_TRACT
  Filled 2023-08-15 (×2): qty 2.5

## 2023-08-15 MED ORDER — ACETAMINOPHEN 650 MG RE SUPP: 650.0000 mg | Freq: Four times a day (QID) | RECTAL | Status: DC | PRN

## 2023-08-15 MED ORDER — SODIUM CHLORIDE 0.9 % IV SOLN: INTRAVENOUS | Status: DC

## 2023-08-15 MED ORDER — SODIUM CHLORIDE 0.9 % IV SOLN
500.0000 mg | INTRAVENOUS | Status: AC
  Administered 2023-08-16 – 2023-08-19 (×5): 500 mg via INTRAVENOUS
  Filled 2023-08-15 (×5): qty 5

## 2023-08-15 MED ORDER — ALBUTEROL SULFATE (2.5 MG/3ML) 0.083% IN NEBU: 2.5000 mg | INHALATION_SOLUTION | RESPIRATORY_TRACT | Status: DC | PRN

## 2023-08-15 MED ORDER — INSULIN ASPART 100 UNIT/ML IJ SOLN
10.0000 [IU] | Freq: Once | INTRAMUSCULAR | Status: AC
  Administered 2023-08-15: 10 [IU] via SUBCUTANEOUS

## 2023-08-15 MED ORDER — LOSARTAN POTASSIUM 50 MG PO TABS
25.0000 mg | ORAL_TABLET | Freq: Every day | ORAL | Status: DC
  Administered 2023-08-15: 25 mg via ORAL
  Filled 2023-08-15: qty 1

## 2023-08-15 MED ORDER — BUDESON-GLYCOPYRROL-FORMOTEROL 160-9-4.8 MCG/ACT IN AERO
1.0000 | INHALATION_SPRAY | Freq: Two times a day (BID) | RESPIRATORY_TRACT | Status: DC
  Administered 2023-08-15 – 2023-08-21 (×12): 1 via RESPIRATORY_TRACT
  Filled 2023-08-15: qty 5.9

## 2023-08-15 MED ORDER — DARUN-COBIC-EMTRICIT-TENOFAF 800-150-200-10 MG PO TABS
1.0000 | ORAL_TABLET | Freq: Every day | ORAL | Status: DC
  Administered 2023-08-15 – 2023-08-21 (×7): 1 via ORAL
  Filled 2023-08-15 (×7): qty 1

## 2023-08-15 MED ORDER — DOCUSATE SODIUM 100 MG PO CAPS
100.0000 mg | ORAL_CAPSULE | Freq: Two times a day (BID) | ORAL | Status: DC
  Administered 2023-08-15 – 2023-08-21 (×13): 100 mg via ORAL
  Filled 2023-08-15 (×13): qty 1

## 2023-08-15 MED ORDER — INSULIN ASPART 100 UNIT/ML IJ SOLN
0.0000 [IU] | Freq: Three times a day (TID) | INTRAMUSCULAR | Status: DC
  Administered 2023-08-15: 15 [IU] via SUBCUTANEOUS
  Administered 2023-08-16: 11 [IU] via SUBCUTANEOUS

## 2023-08-15 MED ORDER — SODIUM CHLORIDE 0.9 % IV SOLN: 1.0000 g | Freq: Once | INTRAVENOUS | Status: DC

## 2023-08-15 MED ORDER — ACETAMINOPHEN 325 MG PO TABS
650.0000 mg | ORAL_TABLET | Freq: Four times a day (QID) | ORAL | Status: DC | PRN
  Administered 2023-08-18: 650 mg via ORAL
  Filled 2023-08-15: qty 2

## 2023-08-15 MED ORDER — INSULIN ASPART 100 UNIT/ML IJ SOLN: 0.0000 [IU] | INTRAMUSCULAR | Status: DC

## 2023-08-15 MED ORDER — ONDANSETRON HCL 4 MG/2ML IJ SOLN
4.0000 mg | Freq: Four times a day (QID) | INTRAMUSCULAR | Status: DC | PRN
  Administered 2023-08-16: 4 mg via INTRAVENOUS
  Filled 2023-08-15: qty 2

## 2023-08-15 MED ORDER — INSULIN GLARGINE-YFGN 100 UNIT/ML ~~LOC~~ SOLN
10.0000 [IU] | Freq: Every day | SUBCUTANEOUS | Status: DC
  Administered 2023-08-15: 10 [IU] via SUBCUTANEOUS
  Filled 2023-08-15 (×2): qty 0.1

## 2023-08-15 MED ORDER — INSULIN REGULAR(HUMAN) IN NACL 100-0.9 UT/100ML-% IV SOLN
INTRAVENOUS | Status: DC
  Administered 2023-08-15: 9 [IU]/h via INTRAVENOUS
  Administered 2023-08-15: 12 [IU]/h via INTRAVENOUS
  Filled 2023-08-15: qty 100

## 2023-08-15 MED ORDER — ENOXAPARIN SODIUM 40 MG/0.4ML IJ SOSY
40.0000 mg | PREFILLED_SYRINGE | Freq: Every day | INTRAMUSCULAR | Status: DC
  Administered 2023-08-15 – 2023-08-20 (×6): 40 mg via SUBCUTANEOUS
  Filled 2023-08-15 (×6): qty 0.4

## 2023-08-15 MED ORDER — BENZONATATE 100 MG PO CAPS
100.0000 mg | ORAL_CAPSULE | Freq: Three times a day (TID) | ORAL | Status: DC | PRN
  Administered 2023-08-15 – 2023-08-18 (×3): 100 mg via ORAL
  Filled 2023-08-15 (×4): qty 1

## 2023-08-15 MED ORDER — INSULIN ASPART 100 UNIT/ML IJ SOLN: 0.0000 [IU] | Freq: Every day | INTRAMUSCULAR | Status: DC

## 2023-08-15 MED ORDER — ROSUVASTATIN CALCIUM 20 MG PO TABS
20.0000 mg | ORAL_TABLET | Freq: Every day | ORAL | Status: DC
  Administered 2023-08-15 – 2023-08-21 (×7): 20 mg via ORAL
  Filled 2023-08-15 (×7): qty 1

## 2023-08-15 MED ORDER — ONDANSETRON HCL 4 MG PO TABS
4.0000 mg | ORAL_TABLET | Freq: Four times a day (QID) | ORAL | Status: DC | PRN
  Administered 2023-08-18: 4 mg via ORAL
  Filled 2023-08-15: qty 1

## 2023-08-15 MED ORDER — IPRATROPIUM-ALBUTEROL 0.5-2.5 (3) MG/3ML IN SOLN
3.0000 mL | Freq: Four times a day (QID) | RESPIRATORY_TRACT | Status: DC
  Administered 2023-08-15 – 2023-08-16 (×4): 3 mL via RESPIRATORY_TRACT
  Filled 2023-08-15 (×4): qty 3

## 2023-08-15 MED ORDER — ENSURE PLUS HIGH PROTEIN PO LIQD
237.0000 mL | Freq: Two times a day (BID) | ORAL | Status: DC
  Administered 2023-08-16 – 2023-08-21 (×9): 237 mL via ORAL

## 2023-08-15 MED ORDER — ALBUTEROL SULFATE (2.5 MG/3ML) 0.083% IN NEBU
2.5000 mg | INHALATION_SOLUTION | Freq: Four times a day (QID) | RESPIRATORY_TRACT | Status: DC
  Administered 2023-08-15 (×2): 2.5 mg via RESPIRATORY_TRACT
  Filled 2023-08-15 (×2): qty 3

## 2023-08-15 MED ORDER — SODIUM CHLORIDE 0.9 % IV SOLN: 500.0000 mg | Freq: Once | INTRAVENOUS | Status: DC

## 2023-08-15 MED ORDER — SULFAMETHOXAZOLE-TRIMETHOPRIM 800-160 MG PO TABS
1.0000 | ORAL_TABLET | Freq: Every day | ORAL | Status: DC
  Administered 2023-08-15 – 2023-08-16 (×2): 1 via ORAL
  Filled 2023-08-15 (×2): qty 1

## 2023-08-15 MED ORDER — INSULIN GLARGINE-YFGN 100 UNIT/ML ~~LOC~~ SOLN: 10.0000 [IU] | Freq: Every day | SUBCUTANEOUS | Status: DC

## 2023-08-15 MED ORDER — INSULIN ASPART 100 UNIT/ML IJ SOLN
15.0000 [IU] | Freq: Once | INTRAMUSCULAR | Status: AC
  Administered 2023-08-15: 15 [IU] via SUBCUTANEOUS

## 2023-08-15 MED ORDER — INSULIN GLARGINE-YFGN 100 UNIT/ML ~~LOC~~ SOLN
25.0000 [IU] | Freq: Two times a day (BID) | SUBCUTANEOUS | Status: DC
  Administered 2023-08-15: 25 [IU] via SUBCUTANEOUS
  Filled 2023-08-15 (×3): qty 0.25

## 2023-08-15 MED ORDER — METHYLPREDNISOLONE SODIUM SUCC 40 MG IJ SOLR
40.0000 mg | Freq: Every day | INTRAMUSCULAR | Status: DC
  Administered 2023-08-15: 40 mg via INTRAVENOUS
  Filled 2023-08-15: qty 1

## 2023-08-15 MED ORDER — GUAIFENESIN ER 600 MG PO TB12
600.0000 mg | ORAL_TABLET | Freq: Two times a day (BID) | ORAL | Status: DC
  Administered 2023-08-15 – 2023-08-21 (×13): 600 mg via ORAL
  Filled 2023-08-15 (×13): qty 1

## 2023-08-15 MED ORDER — INSULIN ASPART 100 UNIT/ML IJ SOLN: 0.0000 [IU] | Freq: Three times a day (TID) | INTRAMUSCULAR | Status: DC

## 2023-08-15 MED ORDER — DOCUSATE SODIUM 100 MG PO CAPS: 100.0000 mg | ORAL_CAPSULE | Freq: Two times a day (BID) | ORAL | Status: DC

## 2023-08-15 MED ORDER — DOLUTEGRAVIR SODIUM 50 MG PO TABS
50.0000 mg | ORAL_TABLET | Freq: Every day | ORAL | Status: DC
  Administered 2023-08-15 – 2023-08-21 (×7): 50 mg via ORAL
  Filled 2023-08-15 (×7): qty 1

## 2023-08-15 NOTE — Progress Notes (Signed)
 Follow up BMP with resolution of anion gap metabolic acidosis, patient with no nausea or vomiting, tolerating po well.  Patient has been on average 9 units per hour per insulin  drip. Capillary glucose 280 and 363,   Plan to transition to basal insulin  25 units bid plus sliding scale.  Stop insulin  drip 2 hrs after basal insulin  injection.

## 2023-08-15 NOTE — ED Provider Notes (Addendum)
  Physical Exam  BP 131/74   Pulse 99   Temp 98.8 F (37.1 C)   Resp 16   Ht 5\' 8"  (1.727 m)   Wt 84 kg   SpO2 95%   BMI 28.16 kg/m   Physical Exam  Procedures  Procedures  ED Course / MDM    Medical Decision Making Amount and/or Complexity of Data Reviewed Labs: ordered. Radiology: ordered.  Risk Prescription drug management. Decision regarding hospitalization.    Patient care assumed at time of shift handoff from Valentina Gasman, PA-C with plans to admit to hospitalist for community-acquired pneumonia with increased oxygen requirement.  Patient also hyperglycemic. See his note for details. Discussed admission with Dr.Acheampong who agrees to see patient for admission      Delories Fetter 08/15/23 0055    Elisa Guest, PA-C 08/15/23 0056    Eldon Greenland, MD 08/15/23 [phone]

## 2023-08-15 NOTE — Progress Notes (Addendum)
 Progress Note   Patient: [name] UEA:[id number] DOB: 17-Oct-1970 DOA: 08/14/2023     0 DOS: the patient was seen and examined on 08/15/2023   Brief hospital course: [name] was admitted to the hospital with the working diagnosis of community acquired pneumonia   53 yo male with the past medical history of COPD/ bronchiectasis, HIV, T2DM, heart failure, pulmonary embolism and colon cancer sp resection in 2020 who presented with dyspnea.  Reported 4 days of dyspnea, associated with productive cough with increased sputum production. EMS was called, her received bronchodilator therapy and was transported to the ED.  On his initial physical examination his blood pressure was 118/75, HR 100, RR 17 and 02 saturation 91% on supplemental 02 per [name] 4L/min, (86% on 2 L/min per [name])  Lungs with rales at the right base with no wheezing, heart with S1 and S2 present and regular with no gallops, abdomen with no distention and positive bilateral pedal edema.   Na 130, K 4.4 Cl 89, bicarbonate 25 glucose 599, bun 30 cr 1,59  AST 15 and Parsons 18  BNP 12,9  High sensitive troponin 8  Lactic acid 1,4  Wbc 9,1 hgb 14.7 plt 211  D dimer 0,59   Sars covid 19 negative  Influenza negative  RSV negative   Chest radiograph with hyperinflation, increased lung markings bilaterally, with right lower lobe patchy infiltrate.   EKG 109 bpm, left axis deviation, left anterior fascicular block, qtc 453, sinus rhythm with left atrial enlargement, poor RR wave progression with no significant ST segment or T wave changes.  Assessment and Plan: CAP (community acquired pneumonia) Right lower lobe infiltrate. (No sepsis)   Plan to continue antibiotic therapy with Ceftriaxone  and Azithromycin .  Systemic corticosteroids.  Follow up on cultures, cell count and temperature curve.   Acute hypoxemic respiratory failure, currently requiring 4 L/min to maintain 02 saturation 92% or grater.  Continue oxymetry monitoring  and supplemental 02 per [name]   COPD (chronic obstructive pulmonary disease) with emphysema ([name]) Advanced emphysema, centrilobular and para-septal CT chest from 2024 personally reviewed.  Positive bronchiectasis.   Plan to continue aggressive airway clearing techniques with flutter valve and incentive spirometer. Bronchodilator therapy and inhaled corticosteroids.  Positive D dimer, will check US  lower extremities, and pulmonary V/Q scan. Holding on CT angiography sue to elevated serum cr.   Chronic systolic CHF (congestive heart failure) ([name]) Patient with clinically stable volume status Continue blood pressure control with losartan  Hold on diuretic therapy for now and follow up on echocardiogram.    CKD stage 3a, GFR 45-59 ml/min ([name]) Pseudo hyponatremia  Renal function stable with serum cr at 1,63, will hold on IV fluids and follow up renal function closely.  Continue losartan  and holding diuretic therapy   Type 2 diabetes mellitus with hyperlipidemia ([name]) Uncontrolled hyperglycemia, DKA on admission.  Anion gap this morning is 16  Plan to continue insulin  drip for now, close follow up on electrolytes.  Patient is tolerating po well.  Continue statin therapy    HIV disease ([name]) Continue anti retroviral therapy Continue PJP prophylaxis with bactrim .   Subjective: Patient continue to have dyspnea and cough, positive pleuritic chest pain on the left.   Physical Exam: Vitals:   08/15/23 0534 08/15/23 0600 08/15/23 0700 08/15/23 0800  BP:  123/76 114/75 114/75  Pulse:  93 91 89  Resp:  15 15 16   Temp: 98.3 F (36.8 C)     TempSrc: Oral     SpO2:  97% 90% 98%  Weight:      Height:       Neurology awake and alert, deconditioned and ill looking appearing ENT with mild pallor Cardiovascular with S1 and S2 present and regular with no gallops, rubs or murmurs No JVD Respiratory with bilateral rhonchi with no wheezing, rales at the right base. Mild increased work of  breathing with accessory muscle use Abdomen with no distention, colostomy in place, non tender and soft to palpation  No lower extremity edema   Data Reviewed:    Family Communication: no family at the bedside   Disposition: Status is: Inpatient Remains inpatient appropriate because: IV antibiotic and respiratory support   Planned Discharge Destination: Home    Author: Albertus Alt, MD 08/15/2023 9:10 AM  For on call review [email or web address].

## 2023-08-15 NOTE — Assessment & Plan Note (Addendum)
 Uncontrolled hyperglycemia, DKA on admission.  Anion gap this morning is 16  Plan to continue insulin  drip for now, close follow up on electrolytes.  Patient is tolerating po well.  Continue statin therapy

## 2023-08-15 NOTE — Hospital Course (Addendum)
 Mr. Oleski was admitted to the hospital with the working diagnosis of community acquired pneumonia   53 yo male with the past medical history of COPD/ bronchiectasis, HIV, T2DM, heart failure, pulmonary embolism and colon cancer sp resection in 2020 who presented with dyspnea.  Reported 4 days of dyspnea, associated with productive cough with increased sputum production. EMS was called, her received bronchodilator therapy and was transported to the ED.  On his initial physical examination his blood pressure was 118/75, HR 100, RR 17 and 02 saturation 91% on supplemental 02 per [place] 4L/min, (86% on 2 L/min per [place])  Lungs with rales at the right base with no wheezing, heart with S1 and S2 present and regular with no gallops, abdomen with no distention and positive bilateral pedal edema.   Na 130, K 4.4 Cl 89, bicarbonate 25 glucose 599, bun 30 cr 1,59  AST 15 and ALT 18  BNP 12,9  High sensitive troponin 8  Lactic acid 1,4  Wbc 9,1 hgb 14.7 plt 211  D dimer 0,59   Sars covid 19 negative  Influenza negative  RSV negative   Chest radiograph with hyperinflation, increased lung markings bilaterally, with right lower lobe patchy infiltrate.   EKG 109 bpm, left axis deviation, left anterior fascicular block, qtc 453, sinus rhythm with left atrial enlargement, poor RR wave progression with no significant ST segment or T wave changes.

## 2023-08-15 NOTE — H&P (Addendum)
 History and Physical    Patient: [name]:[id number] DOB: [birth year] DOA: 08/14/2023 DOS: the patient was seen and examined on 08/15/2023 PCP: Jose Ngo, MD  Patient coming from: Home  Chief Complaint:  Chief Complaint  Patient presents with   Shortness of Breath   HPI: [name] is a 53 y.o. male with medical history significant of bronchiectasis, COPD on home O2, HIV, type 2 diabetes mellitus, CHF, colon cancer status post resection in 2020, history of pulm embolism.  He presented to the emergency room with complaints of worsening shortness of breath over the past 4 days.  He was admitted to cough productive of whitish sputum..  Symptoms have been associated with left sided chest discomfort and pain.  Worse with coughing.  He presented to the emergency room and required up to 4 L/min of oxygen requirements.  Patient reports no improvement with rescue inhalers at home.  Workup in the ED was negative for influenza A and COVID.  Chest x-ray done in the ED however revealed progressive right lower lobe opacity suspicious for pneumonia.  He does have scarring in the left upper and lower lobes.  Review of Systems: As mentioned in the history of present illness. All other systems reviewed and are negative. 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   History of tuberculosis 11/01/2020   HIV disease ([name])    MSSA bacteremia     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)    S/P ORIF (open reduction internal fixation) fracture 03/21/2014   Steroid-induced hyperglycemia 09/05/2021   TB (pulmonary tuberculosis)    previously treated according to refugee documentation   Past Surgical History:  Procedure Laterality Date   BRONCHIAL WASHINGS Bilateral 04/23/2021   Procedure: BRONCHIAL WASHINGS;  Surgeon: Josiah Nigh, MD;  Location: [facility] ENDOSCOPY;  Service: Pulmonary;  Laterality: Bilateral;   FRACTURE SURGERY     IM NAILING TIBIA Right 03/21/2014   ORIF ANKLE FRACTURE Right 03/21/2014   lateral malleolus/notes 03/21/2014   ORIF ANKLE FRACTURE Right 03/21/2014   Procedure: OPEN REDUCTION INTERNAL FIXATION (ORIF) pilon ;  Surgeon: Adah Acron, MD;  Location: [facility] OR;  Service: Orthopedics;  Laterality: Right;   TEE WITHOUT CARDIOVERSION N/A 05/23/2019   Procedure: TRANSESOPHAGEAL ECHOCARDIOGRAM (TEE);  Surgeon: Maudine Sos, MD;  Location: [facility] ENDOSCOPY;  Service: Cardiovascular;  Laterality: N/A;   TIBIA IM NAIL INSERTION Right 03/21/2014   Procedure: INTRAMEDULLARY (IM) NAIL TIBIAL;  Surgeon: Adah Acron, MD;  Location: [facility] OR;  Service: Orthopedics;  Laterality: Right;   VIDEO BRONCHOSCOPY Bilateral 03/02/2016   Procedure: VIDEO BRONCHOSCOPY WITHOUT FLUORO;  Surgeon: Samual Crochet, MD;  Location: [facility] ENDOSCOPY;  Service: Cardiopulmonary;  Laterality: Bilateral;   VIDEO BRONCHOSCOPY Left 04/23/2021   Procedure: VIDEO BRONCHOSCOPY WITHOUT FLUORO;  Surgeon: Josiah Nigh, MD;  Location: [facility] ENDOSCOPY;  Service: Pulmonary;  Laterality: Left;  Social History:  reports that he quit smoking about 22 years ago. His smoking use included cigarettes. He started smoking about 28 years ago. He has a 3 pack-year smoking history. He has never used smokeless tobacco. He reports that he does not currently use alcohol. He reports that he does not use  drugs.  Allergies  Allergen Reactions   Metformin  And Related Rash    Family History  Problem Relation Age of Onset   Hypertension Other    Heart disease Sister     Prior to Admission medications   Medication Sig Start Date End Date Taking? Authorizing Provider  albuterol  (VENTOLIN  HFA) 108 (90 Base) MCG/ACT inhaler Inhale 2 puffs into the lungs every 6 (six) hours as needed for wheezing or shortness of breath. 06/10/23   Carleen Chary, DO  benzonatate  (TESSALON  PERLES) 100 MG capsule Take 1 capsule (100 mg total) by mouth 3 (three) times daily as needed for cough. 06/10/23   Carleen Chary, DO  budeson-glycopyrrolate -formoterol  (BREZTRI  AEROSPHERE) 160-9-4.8 MCG/ACT AERO Inhale 1 puff into the lungs 2 (two) times daily. 06/10/23   Carleen Chary, DO  Darunavir -Cobicistat -Emtricitabine -Tenofovir  Alafenamide (SYMTUZA ) 800-150-200-10 MG TABS Take 1 tablet by mouth daily with breakfast. 05/18/23   Liane Redman, MD  dolutegravir  (TIVICAY ) 50 MG tablet Take 1 tablet (50 mg total) by mouth daily. 05/18/23   Liane Redman, MD  doxycycline  (VIBRA -TABS) 100 MG tablet Take 1 tablet (100 mg total) by mouth every 12 (twelve) hours. 06/05/23   Ronni Colace, DO  losartan  (COZAAR ) 25 MG tablet Take 1 tablet (25 mg total) by mouth daily. 03/22/23 03/21/24  Jackolyn Masker, MD  predniSONE  (DELTASONE ) 20 MG tablet Take 2 tablets (40 mg total) by mouth daily with breakfast. 06/06/23   Ronni Colace, DO  rosuvastatin  (CRESTOR ) 20 MG tablet Take 1 tablet (20 mg total) by mouth daily. 03/22/23 03/21/24  Jackolyn Masker, MD  sulfamethoxazole -trimethoprim  (BACTRIM  DS) 800-160 MG tablet Take 1 tablet by mouth daily. 05/18/23   Liane Redman, MD  triamcinolone  ointment (KENALOG ) 0.1 % Apply 1 Application topically 2 (two) times daily. Patient taking differently: Apply 1 Application topically 2 (two) times daily as needed (rash). 02/15/23   Liane Redman, MD    Physical Exam: Vitals:   08/15/23 0100 08/15/23  0115 08/15/23 0127 08/15/23 0200  BP: 118/75 113/71  107/70  Pulse: 99 100  99  Resp: 17 17  15   Temp:   97.8 F (36.6 C)   TempSrc:   Oral   SpO2: 96% 97%  91%  Weight:      Height:       General: Patient was seen laying comfortably in bed.  Not in any acute distress Head is atraumatic.  Nasal cannula in place Oral mucosa moist Neck: Supple with no JVD Abdomen: Flat soft non tender Extremities with bilateral pedal edema. CNS shows no focal deficit. Mood: Stable Data Reviewed: Sodium is 130, potassium 4.4, chloride 89, bicarb 25, glucose 599, BUN 30, creatinine 1.59 AST 15, ALT 18 BNP was 12.9, troponin 8, WBC 9.1, hemoglobin 14.7, hematocrit is 43, platelet count 211, neutrophils 75.  D-dimer of 0.59  Influenza A, B, RSV and COVID were negative. Chest x-ray shows progressive patchy right lower lobe opacity.  Concerning for pneumonia.  EKG shows sinus tachycardia  Assessment and Plan: 52 year old gentleman with history of HIV on antiretroviral, COPD and bronchiectasis who presents on account of shortness of breath requiring higher oxygen needs.  Chest x-ray with findings of worsening right lower lobe opacity.  Patient be admitted for community-acquired pneumonia/COPD exacerbation  Acute on chronic respiratory failure: Secondary to COPD exacerbation.  Possible underlying community-acquired pneumonia.  Patient has underlying bronchiectasis which could likely be contributory.  Patient will be treated with bronchodilators, Solu-Medrol  and antibiotics.  Will attempt to wean down on oxygen requirements as tolerated by patient's oxygen needs, cough medications will be ordered.  Right lower lobe pneumonia: Prior CT scan showed right lower lobe infiltrate.  Patient also has bronchiectasis at baseline.expectorated sputum studies pending  Consider CTA if symptoms do not improve.  Patient is being treated with antibiotics that includes Rocephin  and azithromycin .  As needed  guaifenesin .  Hyperglycemia crisis in a patient with diabetes melitis: Patient is on insulin  sliding.  Scale.  No evidence of metabolic acidosis at this time.  Will optimize with IV fluids and insulin  therapy.  Sent for A1c.  COPD with acute exacerbation.  Patient is on Solu-Medrol , bronchodilators and antibiotics.  Solu-Medrol  likely to exacerbate hyperglycemia.  Will consider insulin  glargine at night.  HIV on antiretroviral medication.  Will continue with home medication.  History of HFrEF with recovered ejection fraction on last echo.  BNP on this admission was 25.  Patient does exhibit signs of bilateral lower extremity edema.  Chest x-ray were unremarkable.  Repeat echocardiogram.  Last documented echocardiogram was in 2023.   Advance Care Planning:   Code Status: Full Code   Consults:   Family Communication:   Severity of Illness: The appropriate patient status for this patient is OBSERVATION. Observation status is judged to be reasonable and necessary in order to provide the required intensity of service to ensure the patient's safety. The patient's presenting symptoms, physical exam findings, and initial radiographic and laboratory data in the context of their medical condition is felt to place them at decreased risk for further clinical deterioration. Furthermore, it is anticipated that the patient will be medically stable for discharge from the hospital within 2 midnights of admission.   Author: Theodora Fish, MD 08/15/2023 2:48 AM  For on call review [email or web address].

## 2023-08-15 NOTE — Assessment & Plan Note (Addendum)
 Patient with clinically stable volume status Continue blood pressure control with losartan  Hold on diuretic therapy for now and follow up on echocardiogram.

## 2023-08-15 NOTE — Assessment & Plan Note (Signed)
 Advanced emphysema, centrilobular and para-septal CT chest from 2024 personally reviewed.  Positive bronchiectasis.   Plan to continue aggressive airway clearing techniques with flutter valve and incentive spirometer. Bronchodilator therapy and inhaled corticosteroids.  Positive D dimer, will check US  lower extremities, and pulmonary V/Q scan. Holding on CT angiography sue to elevated serum cr.

## 2023-08-15 NOTE — Assessment & Plan Note (Signed)
 Pseudo hyponatremia  Renal function stable with serum cr at 1,63, will hold on IV fluids and follow up renal function closely.  Continue losartan  and holding diuretic therapy

## 2023-08-15 NOTE — Assessment & Plan Note (Signed)
 Continue anti retroviral therapy Continue PJP prophylaxis with bactrim .

## 2023-08-15 NOTE — Assessment & Plan Note (Addendum)
 Right lower lobe infiltrate. (No sepsis)   Plan to continue antibiotic therapy with Ceftriaxone  and Azithromycin .  Systemic corticosteroids.  Follow up on cultures, cell count and temperature curve.   Acute hypoxemic respiratory failure, currently requiring 4 L/min to maintain 02 saturation 92% or grater.  Continue oxymetry monitoring and supplemental 02 per [place]

## 2023-08-16 ENCOUNTER — Inpatient Hospital Stay (HOSPITAL_COMMUNITY)

## 2023-08-16 DIAGNOSIS — I5032 Chronic diastolic (congestive) heart failure: Secondary | ICD-10-CM

## 2023-08-16 DIAGNOSIS — N179 Acute kidney failure, unspecified: Secondary | ICD-10-CM

## 2023-08-16 DIAGNOSIS — B370 Candidal stomatitis: Secondary | ICD-10-CM | POA: Diagnosis not present

## 2023-08-16 DIAGNOSIS — J189 Pneumonia, unspecified organism: Secondary | ICD-10-CM | POA: Diagnosis not present

## 2023-08-16 DIAGNOSIS — J441 Chronic obstructive pulmonary disease with (acute) exacerbation: Secondary | ICD-10-CM | POA: Diagnosis not present

## 2023-08-16 DIAGNOSIS — B20 Human immunodeficiency virus [HIV] disease: Secondary | ICD-10-CM

## 2023-08-16 LAB — ECHOCARDIOGRAM COMPLETE
Area-P 1/2: 4.49 cm2
Est EF: 55
Height: 68 in
S' Lateral: 3.2 cm
Weight: 2962.98 [oz_av]

## 2023-08-16 LAB — CBC
HCT: 37.3 % — ABNORMAL LOW (ref 39.0–52.0)
Hemoglobin: 12.5 g/dL — ABNORMAL LOW (ref 13.0–17.0)
MCH: 32.2 pg (ref 26.0–34.0)
MCHC: 33.5 g/dL (ref 30.0–36.0)
MCV: 96.1 fL (ref 80.0–100.0)
Platelets: 214 10*3/uL (ref 150–400)
RBC: 3.88 MIL/uL — ABNORMAL LOW (ref 4.22–5.81)
RDW: 11.4 % — ABNORMAL LOW (ref 11.5–15.5)
WBC: 16.3 10*3/uL — ABNORMAL HIGH (ref 4.0–10.5)
nRBC: 0 % (ref 0.0–0.2)

## 2023-08-16 LAB — BASIC METABOLIC PANEL WITH GFR
Anion gap: 8 (ref 5–15)
BUN: 32 mg/dL — ABNORMAL HIGH (ref 6–20)
CO2: 26 mmol/L (ref 22–32)
Calcium: 8.9 mg/dL (ref 8.9–10.3)
Chloride: 99 mmol/L (ref 98–111)
Creatinine, Ser: 1.3 mg/dL — ABNORMAL HIGH (ref 0.61–1.24)
GFR, Estimated: >60 mL/min (ref >60)
Glucose, Bld: 287 mg/dL — ABNORMAL HIGH (ref 70–99)
Potassium: 4.4 mmol/L (ref 3.5–5.1)
Sodium: 133 mmol/L — ABNORMAL LOW (ref 135–145)

## 2023-08-16 LAB — GLUCOSE, CAPILLARY
Glucose-Capillary: 289 mg/dL — ABNORMAL HIGH (ref 70–99)
Glucose-Capillary: 346 mg/dL — ABNORMAL HIGH (ref 70–99)
Glucose-Capillary: 360 mg/dL — ABNORMAL HIGH (ref 70–99)
Glucose-Capillary: 230 mg/dL — ABNORMAL HIGH (ref 70–99)
Glucose-Capillary: 267 mg/dL — ABNORMAL HIGH (ref 70–99)

## 2023-08-16 LAB — T-HELPER CELLS (CD4) COUNT (NOT AT ARMC)
CD4 % Helper T Cell: 14 % — ABNORMAL LOW (ref 33–65)
CD4 T Cell Abs: 58 /uL — ABNORMAL LOW (ref 400–1790)

## 2023-08-16 LAB — C-REACTIVE PROTEIN: CRP: 9.1 mg/dL — ABNORMAL HIGH (ref <1.0)

## 2023-08-16 LAB — PROCALCITONIN: Procalcitonin: <0.1 ng/mL

## 2023-08-16 MED ORDER — IOHEXOL 350 MG/ML SOLN
75.0000 mL | Freq: Once | INTRAVENOUS | Status: AC | PRN
  Administered 2023-08-16: 75 mL via INTRAVENOUS

## 2023-08-16 MED ORDER — IPRATROPIUM-ALBUTEROL 0.5-2.5 (3) MG/3ML IN SOLN
3.0000 mL | Freq: Two times a day (BID) | RESPIRATORY_TRACT | Status: DC
  Administered 2023-08-17 – 2023-08-20 (×7): 3 mL via RESPIRATORY_TRACT
  Filled 2023-08-16 (×8): qty 3

## 2023-08-16 MED ORDER — INSULIN ASPART 100 UNIT/ML IJ SOLN
0.0000 [IU] | Freq: Three times a day (TID) | INTRAMUSCULAR | Status: DC
  Administered 2023-08-16: 5 [IU] via SUBCUTANEOUS
  Administered 2023-08-16: 15 [IU] via SUBCUTANEOUS

## 2023-08-16 MED ORDER — PREDNISONE 20 MG PO TABS: 20.0000 mg | ORAL_TABLET | Freq: Every day | ORAL | Status: DC

## 2023-08-16 MED ORDER — ISOSORBIDE MONONITRATE ER 30 MG PO TB24
15.0000 mg | ORAL_TABLET | Freq: Every day | ORAL | Status: DC
  Administered 2023-08-16 – 2023-08-21 (×6): 15 mg via ORAL
  Filled 2023-08-16 (×6): qty 1

## 2023-08-16 MED ORDER — LACTATED RINGERS IV SOLN: INTRAVENOUS | Status: AC

## 2023-08-16 MED ORDER — LACTATED RINGERS IV SOLN: INTRAVENOUS | Status: DC

## 2023-08-16 MED ORDER — INSULIN GLARGINE-YFGN 100 UNIT/ML ~~LOC~~ SOLN
28.0000 [IU] | Freq: Two times a day (BID) | SUBCUTANEOUS | Status: DC
  Filled 2023-08-16 (×2): qty 0.28

## 2023-08-16 MED ORDER — HYDRALAZINE HCL 50 MG PO TABS
50.0000 mg | ORAL_TABLET | Freq: Three times a day (TID) | ORAL | Status: DC
  Administered 2023-08-18 – 2023-08-19 (×3): 50 mg via ORAL
  Filled 2023-08-16 (×10): qty 1

## 2023-08-16 MED ORDER — INSULIN GLARGINE-YFGN 100 UNIT/ML ~~LOC~~ SOLN
20.0000 [IU] | Freq: Two times a day (BID) | SUBCUTANEOUS | Status: DC
  Administered 2023-08-16 – 2023-08-17 (×3): 20 [IU] via SUBCUTANEOUS
  Filled 2023-08-16 (×4): qty 0.2

## 2023-08-16 MED ORDER — INSULIN ASPART 100 UNIT/ML IJ SOLN
4.0000 [IU] | Freq: Three times a day (TID) | INTRAMUSCULAR | Status: DC
  Administered 2023-08-16 – 2023-08-17 (×5): 4 [IU] via SUBCUTANEOUS

## 2023-08-16 MED ORDER — PREDNISONE 20 MG PO TABS: 40.0000 mg | ORAL_TABLET | Freq: Every day | ORAL | Status: DC

## 2023-08-16 MED ORDER — PREDNISONE 20 MG PO TABS
40.0000 mg | ORAL_TABLET | Freq: Two times a day (BID) | ORAL | Status: DC
  Administered 2023-08-16 – 2023-08-18 (×5): 40 mg via ORAL
  Filled 2023-08-16 (×5): qty 2

## 2023-08-16 MED ORDER — INSULIN ASPART 100 UNIT/ML IJ SOLN
0.0000 [IU] | Freq: Every day | INTRAMUSCULAR | Status: DC
  Administered 2023-08-16: 3 [IU] via SUBCUTANEOUS

## 2023-08-16 MED ORDER — SULFAMETHOXAZOLE-TRIMETHOPRIM 400-80 MG/5ML IV SOLN
320.0000 mg | Freq: Three times a day (TID) | INTRAVENOUS | Status: DC
  Administered 2023-08-16 – 2023-08-18 (×6): 320 mg via INTRAVENOUS
  Filled 2023-08-16 (×7): qty 20
  Filled 2023-08-16: qty 2

## 2023-08-16 MED ORDER — FLUCONAZOLE 100 MG PO TABS
200.0000 mg | ORAL_TABLET | Freq: Every day | ORAL | Status: DC
  Administered 2023-08-16 – 2023-08-21 (×6): 200 mg via ORAL
  Filled 2023-08-16 (×7): qty 2

## 2023-08-16 NOTE — Progress Notes (Signed)
 PROGRESS NOTE                                                                                                                                                                                                             Patient Demographics:    [name], is a 53 y.o. male, DOB - [birth year], MWU:[id number]  Outpatient Primary MD for the patient is Alexander-Savino, [name], MD    LOS - 1  Admit date - 08/14/2023    Chief Complaint  Patient presents with   Shortness of Breath       Brief Narrative (HPI from H&P)   53 y.o. male with medical history significant of bronchiectasis, COPD on home O2, HIV, type 2 diabetes mellitus, CHF, colon cancer status post resection in 2020, history of pulm embolism.  He presented to the emergency room with complaints of worsening productive cough and shortness of breath over the past 4 days, diagnosed with acute on chronic hypoxic respiratory failure due to pneumonia and admitted to the hospital   Subjective:    [name] today has, No headache, No chest pain, No abdominal pain - No Nausea, No new weakness tingling or numbness, +ve cough, no SOB   Assessment  & Plan :    Chronic hypoxic respiratory failure in a patient with underlying COPD, bronchiectasis and HIV due to CAP (community acquired pneumonia)  Has been placed on appropriate empiric antibiotics, encouraged to sit in chair use I-S and flutter valve for pulmonary toiletry, supplemental oxygen, of note he uses 2 to 3 L of home oxygen, currently on 4, follow cultures, advance activity and titrate down oxygen.  COPD (chronic obstructive pulmonary disease) with emphysema ([name]) and underlying bronchiectasis. Advanced underlying disease, on 2 to 3 L nasal cannula oxygen at home, currently see above, continue supplemental oxygen and home nebulizer treatments, has no wheezing will discontinue systemic steroids and  monitor.  Chronic diastolic CHF.  Last EF is 50% on echocardiogram done in 2023. Clear euvolemic.  Due to recent AKI will place him on Imdur and hydralazine combination for blood pressure control and CHF.  AKI CKD stage 3a, GFR 45-59 ml/min ([name]) Renal function improved, hold ARB, monitor.  Baseline creatinine close to 1.5.  Elevated D-dimer.  Likely due to #1 above, CTA negative, lower extremity venous duplex pending.  HIV disease.  Question  compliance, in the past had been in poor control, continue antiretroviral therapy along with Bactrim  for PJP prophylaxis, check CD4 count and HIV viral load, ID input.    Type 2 diabetes mellitus with hyperlipidemia ([name]) Poor outpatient control as suggested by elevated A1c, had DKA upon admission, due to steroids sugar running high, insulin  adjusted, steroids are now off, monitor closely.  DM and insulin  education.  Lab Results  Component Value Date   HGBA1C 10.8 (H) 08/15/2023   CBG (last 3)  Recent Labs    08/15/23 2307 08/16/23 0349 08/16/23 0800  GLUCAP 400* 289* 346*          Condition - Extremely Guarded  Family Communication  :  None  Code Status :  Full  Consults  :  ID  PUD Prophylaxis :     Procedures  :     CTA -   1. No pulmonary emboli or acute abnormality. 2. Stable changes of COPD with centrilobular and paraseptal emphysema. 3. Stable areas of bronchiectasis in the left lung with improved mucous plugging and wall thickening. 4. Improved area of residual consolidation in the inferior right lower lobe with a small amount of residual atelectasis or scarring. 5. Stable mildly enlarged bilateral hilar lymph nodes, most likely reactive. 6. Small hiatal hernia.      Disposition Plan  :    Status is: Inpatient   DVT Prophylaxis  :    enoxaparin  (LOVENOX ) injection 40 mg Start: 08/15/23 1000 SCDs Start: 08/15/23 0218      Lab Results  Component Value Date   PLT 214 08/16/2023    Diet :  Diet Order              Diet heart healthy/carb modified Fluid consistency: Thin; Fluid restriction: 1500 mL Fluid  Diet effective now                    Inpatient Medications  Scheduled Meds:  budesonide -glycopyrrolate -formoterol   1 puff Inhalation BID   Darunavir -Cobicistat -Emtricitabine -Tenofovir  Alafenamide  1 tablet Oral Q breakfast   docusate sodium   100 mg Oral BID   dolutegravir   50 mg Oral Daily   enoxaparin  (LOVENOX ) injection  40 mg Subcutaneous Daily   feeding supplement  237 mL Oral BID BM   guaiFENesin   600 mg Oral BID   hydrALAZINE  50 mg Oral Q8H   insulin  aspart  0-15 Units Subcutaneous TID WC   insulin  aspart  0-5 Units Subcutaneous QHS   insulin  aspart  4 Units Subcutaneous TID WC   insulin  glargine-yfgn  20 Units Subcutaneous BID   ipratropium-albuterol   3 mL Nebulization Q6H   isosorbide mononitrate  15 mg Oral Daily   rosuvastatin   20 mg Oral Daily   sulfamethoxazole -trimethoprim   1 tablet Oral Daily   Continuous Infusions:  azithromycin  500 mg (08/16/23 0035)   cefTRIAXone  (ROCEPHIN )  IV 1 g (08/15/23 2330)   lactated ringers      PRN Meds:.acetaminophen  **OR** acetaminophen , albuterol , benzonatate , ondansetron  **OR** ondansetron  (ZOFRAN ) IV    Objective:   Vitals:   08/16/23 0123 08/16/23 0400 08/16/23 0758 08/16/23 0759  BP:  107/66    Pulse:  78    Resp:  18    Temp:  98.1 F (36.7 C)    TempSrc:  Oral    SpO2: 93% 94% 94% 93%  Weight:      Height:        Wt Readings from Last 3 Encounters:  08/14/23 84 kg  06/09/23 84.1  kg  06/04/23 83 kg     Intake/Output Summary (Last 24 hours) at 08/16/2023 0831 Last data filed at 08/16/2023 0035 Gross per 24 hour  Intake 540.23 ml  Output 1320 ml  Net -779.77 ml     Physical Exam  Awake Alert, No new F.N deficits, Normal affect .AT,PERRAL Supple Neck, No JVD,   Symmetrical Chest wall movement, Good air movement bilaterally, CTAB RRR,No Gallops,Rubs or new Murmurs,  +ve B.Sounds, Abd Soft, No  tenderness,   No Cyanosis, Clubbing or edema     Data Review:    Recent Labs  Lab 08/14/23 2144 08/15/23 0010 08/15/23 0358 08/16/23 0430  WBC 9.1  --  7.8 16.3*  HGB 14.7 15.0 13.8 12.5*  HCT 43.6 44.0 41.6 37.3*  PLT 211  --  203 214  MCV 94.2  --  97.2 96.1  MCH 31.7  --  32.2 32.2  MCHC 33.7  --  33.2 33.5  RDW 11.3*  --  11.4* 11.4*  LYMPHSABS 1.7  --   --   --   MONOABS 0.5  --   --   --   EOSABS 0.0  --   --   --   BASOSABS 0.0  --   --   --     Recent Labs  Lab 08/14/23 2144 08/14/23 2158 08/14/23 2159 08/14/23 2217 08/15/23 0010 08/15/23 0358 08/15/23 1437 08/16/23 0430  NA 130*  --   --   --  130* 134* 134* 133*  K 4.4  --   --   --  4.7 5.1 4.7 4.4  CL 89*  --   --   --   --  96* 97* 99  CO2 25  --   --   --   --  22 26 26   ANIONGAP 16*  --   --   --   --  16* 11 8  GLUCOSE 599*  --   --   --   --  578* 329* 287*  BUN 30*  --   --   --   --  32* 28* 32*  CREATININE 1.59*  --   --   --   --  1.63* 1.27* 1.30*  AST 15  --   --   --   --   --   --   --   ALT 18  --   --   --   --   --   --   --   ALKPHOS 84  --   --   --   --   --   --   --   BILITOT 1.0  --   --   --   --   --   --   --   ALBUMIN 3.0*  --   --   --   --   --   --   --   DDIMER  --   --  0.59*  --   --   --   --   --   LATICACIDVEN  --   --   --  1.4  --   --   --   --   HGBA1C  --   --   --   --   --  10.8*  --   --   BNP  --  12.9  --   --   --   --   --   --   CALCIUM   10.0  --   --   --   --  9.2 9.1 8.9      Recent Labs  Lab 08/14/23 2144 08/14/23 2158 08/14/23 2159 08/14/23 2217 08/15/23 0358 08/15/23 1437 08/16/23 0430  DDIMER  --   --  0.59*  --   --   --   --   LATICACIDVEN  --   --   --  1.4  --   --   --   HGBA1C  --   --   --   --  10.8*  --   --   BNP  --  12.9  --   --   --   --   --   CALCIUM  10.0  --   --   --  9.2 9.1 8.9    --------------------------------------------------------------------------------------------------------------- Lab Results   Component Value Date   CHOL 194 03/17/2022   HDL 52 03/17/2022   LDLCALC 119 (H) 03/17/2022   TRIG 115 03/17/2022   CHOLHDL 3.7 03/17/2022    Lab Results  Component Value Date   HGBA1C 10.8 (H) 08/15/2023   No results for input(s): "TSH", "T4TOTAL", "FREET4", "T3FREE", "THYROIDAB" in the last 72 hours. No results for input(s): "VITAMINB12", "FOLATE", "FERRITIN", "TIBC", "IRON", "RETICCTPCT" in the last 72 hours. ------------------------------------------------------------------------------------------------------------------ Cardiac Enzymes No results for input(s): "CKMB", "TROPONINI", "MYOGLOBIN" in the last 168 hours.  Invalid input(s): "CK"  Micro Results Recent Results (from the past 240 hours)  Resp panel by RT-PCR (RSV, Flu A&B, Covid) Anterior Nasal Swab     Status: None   Collection Time: 08/14/23  9:44 PM   Specimen: Anterior Nasal Swab  Result Value Ref Range Status   SARS Coronavirus 2 by RT PCR NEGATIVE NEGATIVE Final   Influenza A by PCR NEGATIVE NEGATIVE Final   Influenza B by PCR NEGATIVE NEGATIVE Final    Comment: (NOTE) The Xpert Xpress SARS-CoV-2/FLU/RSV plus assay is intended as an aid in the diagnosis of influenza from Nasopharyngeal swab specimens and should not be used as a sole basis for treatment. Nasal washings and aspirates are unacceptable for Xpert Xpress SARS-CoV-2/FLU/RSV testing.  Fact Sheet for Patients: BloggerCourse.com  Fact Sheet for Healthcare Providers: SeriousBroker.it  This test is not yet approved or cleared by the [place]  FDA and has been authorized for detection and/or diagnosis of SARS-CoV-2 by FDA under an Emergency Use Authorization (EUA). This EUA will remain in effect (meaning this test can be used) for the duration of the COVID-19 declaration under Section 564(b)(1) of the Act, 21 U.S.C. section 360bbb-3(b)(1), unless the authorization is terminated or revoked.      Resp Syncytial Virus by PCR NEGATIVE NEGATIVE Final    Comment: (NOTE) Fact Sheet for Patients: BloggerCourse.com  Fact Sheet for Healthcare Providers: SeriousBroker.it  This test is not yet approved or cleared by the [place]  FDA and has been authorized for detection and/or diagnosis of SARS-CoV-2 by FDA under an Emergency Use Authorization (EUA). This EUA will remain in effect (meaning this test can be used) for the duration of the COVID-19 declaration under Section 564(b)(1) of the Act, 21 U.S.C. section 360bbb-3(b)(1), unless the authorization is terminated or revoked.  Performed at [facility], [address] [place], [place] [address]   Culture, blood (routine x 2)     Status: None (Preliminary result)   Collection Time: 08/14/23 10:00 PM   Specimen: BLOOD  Result Value Ref Range Status   Specimen Description BLOOD LEFT ARM  Final  Special Requests   Final    BOTTLES DRAWN AEROBIC AND ANAEROBIC Blood Culture results may not be optimal due to an inadequate volume of blood received in culture bottles   Culture   Final    NO GROWTH < 12 HOURS Performed at [facility], [address] [place], [place] [address]    Report Status PENDING  Incomplete  Culture, blood (routine x 2)     Status: None (Preliminary result)   Collection Time: 08/14/23 10:05 PM   Specimen: BLOOD RIGHT HAND  Result Value Ref Range Status   Specimen Description BLOOD RIGHT HAND  Final   Special Requests   Final    BOTTLES DRAWN AEROBIC AND ANAEROBIC Blood Culture results may not be optimal due to an inadequate volume of blood received in culture bottles   Culture   Final    NO GROWTH < 12 HOURS Performed at [facility], [address] [place], [place] [address]    Report Status PENDING  Incomplete    Radiology Report DG Chest Port 1 View Result Date: 08/14/2023 EXAM: 1 VIEW XRAY OF THE CHEST 08/14/2023 10:05:58 PM  COMPARISON: 06/04/2023 CLINICAL HISTORY: shob. SHOB shob. SHOB FINDINGS: LUNGS AND PLEURA: Progressive patchy right lower lobe opacity, suspicious for pneumonia. Stable scarring / bronchiectasis in the left upper and lower lobes, chronic. No pulmonary edema. No pleural effusion. No pneumothorax. HEART AND MEDIASTINUM: No acute abnormality of the cardiac and mediastinal silhouettes. BONES AND SOFT TISSUES: No acute osseous abnormality. IMPRESSION: 1. Progressive patchy right lower lobe opacity, suspicious for pneumonia. 2. Stable scarring/bronchiectasis in the left upper and lower lobes. Electronically signed by: Zadie Herter MD 08/14/2023 10:08 PM EDT RP Workstation: [id number]     Signature  -   Lynnwood Sauer M.D on 08/16/2023 at 8:31 AM   -  To page go to [email or web address]

## 2023-08-16 NOTE — Consult Note (Signed)
 [facility]  Total days of antibiotics 32       Reason for Consult:pneumonia in AIDS patient    Referring Physician: singh  Active Problems:   HIV disease ([name])   Chronic systolic CHF (congestive heart failure) ([name])   Type 2 diabetes mellitus with hyperlipidemia ([name])   COPD (chronic obstructive pulmonary disease) with emphysema ([name])   CAP (community acquired pneumonia)   CKD stage 3a, GFR 45-59 ml/min ([name])    HPI: [name] is a 53 y.o. male with advanced hiv disease,  CD 4 count of 58 ( 14%) VL 29 (march 2025) on tivicay /symtuza  and bactrim  proph. Also has COPD where he is on [facility], He is admitted on 6/1 for 4 day history of worsening productive cough, with left sided chest wall pain due to coughing. His work up revealed CXR showing RLL infiltrate. Covid/flu negative. His initial WBC of 7K, slightly above baseline and mild aki. He was found to be hypoxic ,and needing 4L [place]. He received ceftriaxone  plus azithromycin  and steroids. WBC up to 16K but likely steroid effect. He is still having productive cough and at times difficult to catch breath.  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   History of tuberculosis 11/01/2020   HIV disease ([name])    MSSA bacteremia    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)    S/P ORIF (open reduction  internal fixation) fracture 03/21/2014   Steroid-induced hyperglycemia 09/05/2021   TB (pulmonary tuberculosis)    previously treated according to refugee documentation    Allergies:  Allergies  Allergen Reactions   Metformin  And Related Rash    Current antibiotics:   MEDICATIONS:  budesonide -glycopyrrolate -formoterol   1 puff Inhalation BID   Darunavir -Cobicistat -Emtricitabine -Tenofovir  Alafenamide  1 tablet Oral Q breakfast   docusate sodium   100 mg Oral BID   dolutegravir   50 mg Oral Daily   enoxaparin  (LOVENOX ) injection  40 mg Subcutaneous Daily   feeding supplement  237 mL Oral BID BM   fluconazole   200 mg Oral Daily   guaiFENesin   600 mg Oral BID   hydrALAZINE  50 mg Oral Q8H   insulin  aspart  0-15 Units Subcutaneous TID WC   insulin  aspart  0-5 Units Subcutaneous QHS   insulin  aspart  4 Units Subcutaneous TID WC   insulin  glargine-yfgn  20 Units Subcutaneous BID   ipratropium-albuterol   3 mL Nebulization Q6H   isosorbide mononitrate  15 mg Oral Daily   rosuvastatin   20 mg Oral Daily   sulfamethoxazole -trimethoprim   1 tablet Oral Daily    Social History   Tobacco Use   Smoking status: Former    Current packs/day: 0.00    Average packs/day: 0.5 packs/day for 6.0 years (3.0 ttl pk-yrs)    Types: Cigarettes    Start date: 03/17/1995    Quit date: 03/16/2001  Years since quitting: 22.4   Smokeless tobacco: Never   Tobacco comments:    "quit smoking ~ 2003"  Vaping Use   Vaping status: Never Used  Substance Use Topics   Alcohol use: Not Currently    Comment: drinks bottled beer intermittently   Drug use: No    Family History  Problem Relation Age of Onset   Hypertension Other    Heart disease Sister     Review of Systems - 12 point ros is negative except what is mentioned above   OBJECTIVE: Temp:  [97.5 F (36.4 C)-98.5 F (36.9 C)] 98.5 F (36.9 C) (06/02 1200) Pulse Rate:  [78-94] 94 (06/02 1200) Resp:  [17-25] 20 (06/02 1200) BP:  (96-119)/(58-79) 96/58 (06/02 1200) SpO2:  [90 %-94 %] 90 % (06/02 1200) Physical Exam  Constitutional: He is oriented to person, place, and time. He appears well-developed and well-nourished. No distress.  HENT:  Mouth/Throat: Oropharynx is clear and moist. +mild thrush Cardiovascular: Normal rate, regular rhythm and normal heart sounds. Exam reveals no gallop and no friction rub.  No murmur heard.  Pulmonary/Chest: Effort normal and breath sounds normal. No respiratory distress. He has no wheezes.  Abdominal: Soft. Bowel sounds are normal. He exhibits no distension. There is no tenderness.  Lymphadenopathy:  He has no cervical adenopathy.  Neurological: He is alert and oriented to person, place, and time.  Skin: Skin is warm and dry. No rash noted. No erythema.  Psychiatric: He has a normal mood and affect. His behavior is normal.    LABS: Results for orders placed or performed during the hospital encounter of 08/14/23 (from the past 48 hours)  Resp panel by RT-PCR (RSV, Flu A&B, Covid) Anterior Nasal Swab     Status: None   Collection Time: 08/14/23  9:44 PM   Specimen: Anterior Nasal Swab  Result Value Ref Range   SARS Coronavirus 2 by RT PCR NEGATIVE NEGATIVE   Influenza A by PCR NEGATIVE NEGATIVE   Influenza B by PCR NEGATIVE NEGATIVE    Comment: (NOTE) The Xpert Xpress SARS-CoV-2/FLU/RSV plus assay is intended as an aid in the diagnosis of influenza from Nasopharyngeal swab specimens and should not be used as a sole basis for treatment. Nasal washings and aspirates are unacceptable for Xpert Xpress SARS-CoV-2/FLU/RSV testing.  Fact Sheet for Patients: BloggerCourse.com  Fact Sheet for Healthcare Providers: SeriousBroker.it  This test is not yet approved or cleared by the [place]  FDA and has been authorized for detection and/or diagnosis of SARS-CoV-2 by FDA under an Emergency Use Authorization (EUA). This EUA will  remain in effect (meaning this test can be used) for the duration of the COVID-19 declaration under Section 564(b)(1) of the Act, 21 U.S.C. section 360bbb-3(b)(1), unless the authorization is terminated or revoked.     Resp Syncytial Virus by PCR NEGATIVE NEGATIVE    Comment: (NOTE) Fact Sheet for Patients: BloggerCourse.com  Fact Sheet for Healthcare Providers: SeriousBroker.it  This test is not yet approved or cleared by the [place]  FDA and has been authorized for detection and/or diagnosis of SARS-CoV-2 by FDA under an Emergency Use Authorization (EUA). This EUA will remain in effect (meaning this test can be used) for the duration of the COVID-19 declaration under Section 564(b)(1) of the Act, 21 U.S.C. section 360bbb-3(b)(1), unless the authorization is terminated or revoked.  Performed at [facility], [address] [place], [place] [address]   CBC with Differential/Platelet     Status: Abnormal   Collection  Time: 08/14/23  9:44 PM  Result Value Ref Range   WBC 9.1 4.0 - 10.5 K/uL   RBC 4.63 4.22 - 5.81 MIL/uL   Hemoglobin 14.7 13.0 - 17.0 g/dL   HCT 16.1 09.6 - 04.5 %   MCV 94.2 80.0 - 100.0 fL   MCH 31.7 26.0 - 34.0 pg   MCHC 33.7 30.0 - 36.0 g/dL   RDW 40.9 (L) 81.1 - 91.4 %   Platelets 211 150 - 400 K/uL   nRBC 0.0 0.0 - 0.2 %   Neutrophils Relative % 75 %   Neutro Abs 6.8 1.7 - 7.7 K/uL   Lymphocytes Relative 18 %   Lymphs Abs 1.7 0.7 - 4.0 K/uL   Monocytes Relative 6 %   Monocytes Absolute 0.5 0.1 - 1.0 K/uL   Eosinophils Relative 0 %   Eosinophils Absolute 0.0 0.0 - 0.5 K/uL   Basophils Relative 0 %   Basophils Absolute 0.0 0.0 - 0.1 K/uL   Immature Granulocytes 1 %   Abs Immature Granulocytes 0.07 0.00 - 0.07 K/uL    Comment: Performed at [facility], [address] [place], [place] [address]  Comprehensive metabolic panel     Status: Abnormal   Collection Time: 08/14/23  9:44 PM   Result Value Ref Range   Sodium 130 (L) 135 - 145 mmol/L   Potassium 4.4 3.5 - 5.1 mmol/L   Chloride 89 (L) 98 - 111 mmol/L   CO2 25 22 - 32 mmol/L   Glucose, Bld 599 (HH) 70 - 99 mg/dL    Comment: CRITICAL RESULT CALLED TO, READ BACK BY AND VERIFIED WITH THABET L, RN 2251 08/14/2023 SANDOVAL K Glucose reference range applies only to samples taken after fasting for at least 8 hours.    BUN 30 (H) 6 - 20 mg/dL   Creatinine, Ser 6.21 (H) 0.61 - 1.24 mg/dL   Calcium  10.0 8.9 - 10.3 mg/dL   Total Protein 8.8 (H) 6.5 - 8.1 g/dL   Albumin 3.0 (L) 3.5 - 5.0 g/dL   AST 15 15 - 41 U/L   ALT 18 0 - 44 U/L   Alkaline Phosphatase 84 38 - 126 U/L   Total Bilirubin 1.0 0.0 - 1.2 mg/dL   GFR, Estimated 52 (L) >60 mL/min    Comment: (NOTE) Calculated using the CKD-EPI Creatinine Equation (2021)    Anion gap 16 (H) 5 - 15    Comment: Performed at [name], [address] [place], [place] [address]  Brain natriuretic peptide     Status: None   Collection Time: 08/14/23  9:58 PM  Result Value Ref Range   B Natriuretic Peptide 12.9 0.0 - 100.0 pg/mL    Comment: Performed at [facility], [address] [place], [place] [address]  Troponin I (High Sensitivity)     Status: None   Collection Time: 08/14/23  9:59 PM  Result Value Ref Range   Troponin I (High Sensitivity) 8 <18 ng/L    Comment: (NOTE) Elevated high sensitivity troponin I (hsTnI) values and significant  changes across serial measurements may suggest ACS but many other  chronic and acute conditions are known to elevate hsTnI results.  Refer to the "Links" section for chest pain algorithms and additional  guidance. Performed at [facility], [address] [place], [place] [address]   D-dimer, quantitative     Status: Abnormal   Collection Time: 08/14/23  9:59 PM  Result Value Ref  Range   D-Dimer, Quant 0.59 (H) 0.00 - 0.50 ug/mL-FEU    Comment: (NOTE) At the manufacturer cut-off value of 0.5 g/mL FEU,  this assay has a negative predictive value of 95-100%.This assay is intended for use in conjunction with a clinical pretest probability (PTP) assessment model to exclude pulmonary embolism (PE) and deep venous thrombosis (DVT) in outpatients suspected of PE or DVT. Results should be correlated with clinical presentation. Performed at [facility], [address] [place], [place] [address]   Culture, blood (routine x 2)     Status: None (Preliminary result)   Collection Time: 08/14/23 10:00 PM   Specimen: BLOOD  Result Value Ref Range   Specimen Description BLOOD LEFT ARM    Special Requests      BOTTLES DRAWN AEROBIC AND ANAEROBIC Blood Culture results may not be optimal due to an inadequate volume of blood received in culture bottles   Culture      NO GROWTH 2 DAYS Performed at [facility], [address], [place], [place] [address]    Report Status PENDING   Culture, blood (routine x 2)     Status: None (Preliminary result)   Collection Time: 08/14/23 10:05 PM   Specimen: BLOOD RIGHT HAND  Result Value Ref Range   Specimen Description BLOOD RIGHT HAND    Special Requests      BOTTLES DRAWN AEROBIC AND ANAEROBIC Blood Culture results may not be optimal due to an inadequate volume of blood received in culture bottles   Culture      NO GROWTH 2 DAYS Performed at [facility], [address], [place], [place] [address]    Report Status PENDING   I-Stat CG4 Lactic Acid     Status: None   Collection Time: 08/14/23 10:17 PM  Result Value Ref Range   Lactic Acid, Venous 1.4 0.5 - 1.9 mmol/L  I-Stat venous blood gas, ([facility], [facility], [facility])     Status: Abnormal   Collection Time: 08/15/23 12:10 AM  Result Value Ref Range   pH, Ven 7.377 7.25 - 7.43   pCO2, Ven 42.3 (L) 44 - 60 mmHg   pO2, Ven 70 (H) 32 - 45 mmHg   Bicarbonate 24.8 20.0 - 28.0 mmol/L   TCO2 26 22 - 32 mmol/L   O2 Saturation 93 %   Acid-Base Excess 0.0 0.0 - 2.0 mmol/L   Sodium 130 (L) 135 - 145  mmol/L   Potassium 4.7 3.5 - 5.1 mmol/L   Calcium , Ion 1.16 1.15 - 1.40 mmol/L   HCT 44.0 39.0 - 52.0 %   Hemoglobin 15.0 13.0 - 17.0 g/dL   Sample type VENOUS   POC CBG, ED     Status: Abnormal   Collection Time: 08/15/23 12:11 AM  Result Value Ref Range   Glucose-Capillary 540 (HH) 70 - 99 mg/dL    Comment: Glucose reference range applies only to samples taken after fasting for at least 8 hours.   Comment 1 Notify RN   Basic metabolic panel     Status: Abnormal   Collection Time: 08/15/23  3:58 AM  Result Value Ref Range   Sodium 134 (L) 135 - 145 mmol/L   Potassium 5.1 3.5 - 5.1 mmol/L   Chloride 96 (L) 98 - 111 mmol/L   CO2 22 22 - 32 mmol/L   Glucose, Bld 578 (HH) 70 - 99 mg/dL    Comment: CRITICAL RESULT CALLED TO, READ BACK BY AND VERIFIED WITH  Annice Barthel, RN [phone] 08/15/2023 SANDOVAL K Glucose reference range applies only to samples taken after fasting for at least 8 hours.    BUN 32 (H) 6 - 20 mg/dL   Creatinine, Ser 5.40 (H) 0.61 - 1.24 mg/dL   Calcium  9.2 8.9 - 10.3 mg/dL   GFR, Estimated 50 (L) >60 mL/min    Comment: (NOTE) Calculated using the CKD-EPI Creatinine Equation (2021)    Anion gap 16 (H) 5 - 15    Comment: Performed at [facility], [address], [place], [place] [address]  CBC     Status: Abnormal   Collection Time: 08/15/23  3:58 AM  Result Value Ref Range   WBC 7.8 4.0 - 10.5 K/uL   RBC 4.28 4.22 - 5.81 MIL/uL   Hemoglobin 13.8 13.0 - 17.0 g/dL   HCT 14.7 82.9 - 56.2 %   MCV 97.2 80.0 - 100.0 fL   MCH 32.2 26.0 - 34.0 pg   MCHC 33.2 30.0 - 36.0 g/dL   RDW 13.0 (L) 86.5 - 78.4 %   Platelets 203 150 - 400 K/uL   nRBC 0.0 0.0 - 0.2 %    Comment: Performed at [facility], [address], [place], [place] [address]  Hemoglobin A1c     Status: Abnormal   Collection Time: 08/15/23  3:58 AM  Result Value Ref Range   Hgb A1c MFr Bld 10.8 (H) 4.8 - 5.6 %    Comment: (NOTE) Diagnosis of Diabetes The following HbA1c ranges recommended by the  American Diabetes Association (ADA) may be used as an aid in the diagnosis of diabetes mellitus.  Hemoglobin             Suggested A1C NGSP%              Diagnosis  <5.7                   Non Diabetic  5.7-6.4                Pre-Diabetic  >6.4                   Diabetic  <7.0                   Glycemic control for                       adults with diabetes.     Mean Plasma Glucose 263.26 mg/dL    Comment: Performed at [facility], [address], [place], [place] [address]  Beta-hydroxybutyric acid     Status: Abnormal   Collection Time: 08/15/23  3:58 AM  Result Value Ref Range   Beta-Hydroxybutyric Acid 4.69 (H) 0.05 - 0.27 mmol/L    Comment: RESULT CONFIRMED BY MANUAL DILUTION Performed at [facility], [address], [place], [place] [address]   CBG monitoring, ED     Status: Abnormal   Collection Time: 08/15/23  5:40 AM  Result Value Ref Range   Glucose-Capillary 543 (HH) 70 - 99 mg/dL    Comment: Glucose reference range applies only to samples taken after fasting for at least 8 hours.   Comment 1 Notify RN    Comment 2 Document in Chart   CBG monitoring, ED     Status: Abnormal   Collection Time: 08/15/23  8:17 AM  Result Value Ref Range   Glucose-Capillary 439 (H) 70 - 99  mg/dL    Comment: Glucose reference range applies only to samples taken after fasting for at least 8 hours.  CBG monitoring, ED     Status: Abnormal   Collection Time: 08/15/23  9:38 AM  Result Value Ref Range   Glucose-Capillary 560 (HH) 70 - 99 mg/dL    Comment: Glucose reference range applies only to samples taken after fasting for at least 8 hours.   Comment 1 Notify RN   CBG monitoring, ED     Status: Abnormal   Collection Time: 08/15/23 10:02 AM  Result Value Ref Range   Glucose-Capillary 524 (HH) 70 - 99 mg/dL    Comment: Glucose reference range applies only to samples taken after fasting for at least 8 hours.   Comment 1 Notify RN   CBG monitoring, ED     Status:  Abnormal   Collection Time: 08/15/23 11:08 AM  Result Value Ref Range   Glucose-Capillary 448 (H) 70 - 99 mg/dL    Comment: Glucose reference range applies only to samples taken after fasting for at least 8 hours.  Glucose, capillary     Status: Abnormal   Collection Time: 08/15/23 11:37 AM  Result Value Ref Range   Glucose-Capillary 386 (H) 70 - 99 mg/dL    Comment: Glucose reference range applies only to samples taken after fasting for at least 8 hours.  Glucose, capillary     Status: Abnormal   Collection Time: 08/15/23 12:40 PM  Result Value Ref Range   Glucose-Capillary 321 (H) 70 - 99 mg/dL    Comment: Glucose reference range applies only to samples taken after fasting for at least 8 hours.  Glucose, capillary     Status: Abnormal   Collection Time: 08/15/23  1:43 PM  Result Value Ref Range   Glucose-Capillary 280 (H) 70 - 99 mg/dL    Comment: Glucose reference range applies only to samples taken after fasting for at least 8 hours.  Basic metabolic panel     Status: Abnormal   Collection Time: 08/15/23  2:37 PM  Result Value Ref Range   Sodium 134 (L) 135 - 145 mmol/L   Potassium 4.7 3.5 - 5.1 mmol/L   Chloride 97 (L) 98 - 111 mmol/L   CO2 26 22 - 32 mmol/L   Glucose, Bld 329 (H) 70 - 99 mg/dL    Comment: Glucose reference range applies only to samples taken after fasting for at least 8 hours.   BUN 28 (H) 6 - 20 mg/dL   Creatinine, Ser 5.36 (H) 0.61 - 1.24 mg/dL   Calcium  9.1 8.9 - 10.3 mg/dL   GFR, Estimated >64 >40 mL/min    Comment: (NOTE) Calculated using the CKD-EPI Creatinine Equation (2021)    Anion gap 11 5 - 15    Comment: Performed at [facility], [address] [place], [place] [address]  Glucose, capillary     Status: Abnormal   Collection Time: 08/15/23  2:45 PM  Result Value Ref Range   Glucose-Capillary 363 (H) 70 - 99 mg/dL    Comment: Glucose reference range applies only to samples taken after fasting for at least 8 hours.  Glucose, capillary      Status: Abnormal   Collection Time: 08/15/23  3:32 PM  Result Value Ref Range   Glucose-Capillary 413 (H) 70 - 99 mg/dL    Comment: Glucose reference range applies only to samples taken after fasting for at least 8 hours.  Glucose, capillary  Status: Abnormal   Collection Time: 08/15/23  4:36 PM  Result Value Ref Range   Glucose-Capillary 352 (H) 70 - 99 mg/dL    Comment: Glucose reference range applies only to samples taken after fasting for at least 8 hours.  Glucose, capillary     Status: Abnormal   Collection Time: 08/15/23  7:38 PM  Result Value Ref Range   Glucose-Capillary 486 (H) 70 - 99 mg/dL    Comment: Glucose reference range applies only to samples taken after fasting for at least 8 hours.  Glucose, capillary     Status: Abnormal   Collection Time: 08/15/23 11:07 PM  Result Value Ref Range   Glucose-Capillary 400 (H) 70 - 99 mg/dL    Comment: Glucose reference range applies only to samples taken after fasting for at least 8 hours.  Glucose, capillary     Status: Abnormal   Collection Time: 08/16/23  3:49 AM  Result Value Ref Range   Glucose-Capillary 289 (H) 70 - 99 mg/dL    Comment: Glucose reference range applies only to samples taken after fasting for at least 8 hours.  Basic metabolic panel with GFR     Status: Abnormal   Collection Time: 08/16/23  4:30 AM  Result Value Ref Range   Sodium 133 (L) 135 - 145 mmol/L   Potassium 4.4 3.5 - 5.1 mmol/L   Chloride 99 98 - 111 mmol/L   CO2 26 22 - 32 mmol/L   Glucose, Bld 287 (H) 70 - 99 mg/dL    Comment: Glucose reference range applies only to samples taken after fasting for at least 8 hours.   BUN 32 (H) 6 - 20 mg/dL   Creatinine, Ser 1.61 (H) 0.61 - 1.24 mg/dL   Calcium  8.9 8.9 - 10.3 mg/dL   GFR, Estimated >09 >60 mL/min    Comment: (NOTE) Calculated using the CKD-EPI Creatinine Equation (2021)    Anion gap 8 5 - 15    Comment: Performed at [facility], [address], [place], [place] [address]   CBC     Status: Abnormal   Collection Time: 08/16/23  4:30 AM  Result Value Ref Range   WBC 16.3 (H) 4.0 - 10.5 K/uL   RBC 3.88 (L) 4.22 - 5.81 MIL/uL   Hemoglobin 12.5 (L) 13.0 - 17.0 g/dL   HCT 81.1 (L) 91.4 - 78.2 %   MCV 96.1 80.0 - 100.0 fL   MCH 32.2 26.0 - 34.0 pg   MCHC 33.5 30.0 - 36.0 g/dL   RDW 95.6 (L) 21.3 - 08.6 %   Platelets 214 150 - 400 K/uL   nRBC 0.0 0.0 - 0.2 %    Comment: Performed at [facility], [address], [place], [place] [address]  Glucose, capillary     Status: Abnormal   Collection Time: 08/16/23  8:00 AM  Result Value Ref Range   Glucose-Capillary 346 (H) 70 - 99 mg/dL    Comment: Glucose reference range applies only to samples taken after fasting for at least 8 hours.  C-reactive protein     Status: Abnormal   Collection Time: 08/16/23  8:55 AM  Result Value Ref Range   CRP 9.1 (H) <1.0 mg/dL    Comment: Performed at [facility], [address] [place], [place] [address]  Procalcitonin     Status: None   Collection Time: 08/16/23  8:55 AM  Result Value Ref Range   Procalcitonin <0.10 ng/mL  Comment:        Interpretation: PCT (Procalcitonin) <= 0.5 ng/mL: Systemic infection (sepsis) is not likely. Local bacterial infection is possible. (NOTE)       Sepsis PCT Algorithm           Lower Respiratory Tract                                      Infection PCT Algorithm    ----------------------------     ----------------------------         PCT < 0.25 ng/mL                PCT < 0.10 ng/mL          Strongly encourage             Strongly discourage   discontinuation of antibiotics    initiation of antibiotics    ----------------------------     -----------------------------       PCT 0.25 - 0.50 ng/mL            PCT 0.10 - 0.25 ng/mL               OR       >80% decrease in PCT            Discourage initiation of                                            antibiotics      Encourage discontinuation           of antibiotics     ----------------------------     -----------------------------         PCT >= 0.50 ng/mL              PCT 0.26 - 0.50 ng/mL               AND        <80% decrease in PCT             Encourage initiation of                                             antibiotics       Encourage continuation           of antibiotics    ----------------------------     -----------------------------        PCT >= 0.50 ng/mL                  PCT > 0.50 ng/mL               AND         increase in PCT                  Strongly encourage                                      initiation of antibiotics    Strongly encourage escalation           of antibiotics                                     -----------------------------  PCT <= 0.25 ng/mL                                                 OR                                        > 80% decrease in PCT                                      Discontinue / Do not initiate                                             antibiotics  Performed at [facility], [address] [place], [place] [address]   T-helper cells (CD4) count (not at [facility])     Status: Abnormal   Collection Time: 08/16/23  8:55 AM  Result Value Ref Range   CD4 T Cell Abs 58 (L) 400 - 1,790 /uL   CD4 % Helper T Cell 14 (L) 33 - 65 %    Comment: Performed at [facility], [address] [place], [place] [address]  Glucose, capillary     Status: Abnormal   Collection Time: 08/16/23 11:41 AM  Result Value Ref Range   Glucose-Capillary 360 (H) 70 - 99 mg/dL    Comment: Glucose reference range applies only to samples taken after fasting for at least 8 hours.    MICRO:  IMAGING: CT Angio Chest Pulmonary Embolism (PE) W or WO Contrast Result Date: 08/16/2023 CLINICAL DATA:  Suspected pulmonary embolism EXAM: CT ANGIOGRAPHY CHEST WITH CONTRAST TECHNIQUE: Multidetector CT imaging of the chest was performed using the standard protocol  during bolus administration of intravenous contrast. Multiplanar CT image reconstructions and MIPs were obtained to evaluate the vascular anatomy. RADIATION DOSE REDUCTION: This exam was performed according to the departmental dose-optimization program which includes automated exposure control, adjustment of the mA and/or kV according to patient size and/or use of iterative reconstruction technique. CONTRAST:  75mL OMNIPAQUE  IOHEXOL  350 MG/ML SOLN COMPARISON:  CT angio August 19, 2022 FINDINGS: Cardiovascular: Satisfactory opacification of the pulmonary arteries to the segmental level. No evidence of pulmonary embolism. Normal heart size. No pericardial effusion. Mediastinum/Nodes: No enlarged mediastinal, hilar, or axillary lymph nodes. Thyroid gland, trachea, and esophagus demonstrate no significant findings. Lungs/Pleura: Comparison with prior examination accounting for differences in techniques no significant change in the chronic interstitial lung disease, with fibrotic changes particularly involving the left lung, fibro atelectatic changes of both upper lobes as well as bronchiectatic changes of the bases of the lungs. There is however, some consolidation within the right lower lobe which correlates with a superimposed pneumonia. There is no suspicious pulmonary nodules. Upper Abdomen: No acute abnormality. Musculoskeletal: No chest wall abnormality. No acute or significant osseous findings. Review of the MIP images confirms the above findings. IMPRESSION: *No evidence of pulmonary embolism. *Chronic interstitial lung disease with fibrotic changes particularly involving the left lung, fibro atelectatic changes of both upper lobes as well as bronchiectatic changes of the bases of the lungs. *There  is however, some consolidation within the right lower lobe which correlates with a superimposed pneumonia. Electronically Signed   By: Fredrich Jefferson M.D.   On: 08/16/2023 08:40   DG Chest Port 1 View Result Date:  08/14/2023 EXAM: 1 VIEW XRAY OF THE CHEST 08/14/2023 10:05:58 PM COMPARISON: 06/04/2023 CLINICAL HISTORY: shob. SHOB shob. SHOB FINDINGS: LUNGS AND PLEURA: Progressive patchy right lower lobe opacity, suspicious for pneumonia. Stable scarring / bronchiectasis in the left upper and lower lobes, chronic. No pulmonary edema. No pleural effusion. No pneumothorax. HEART AND MEDIASTINUM: No acute abnormality of the cardiac and mediastinal silhouettes. BONES AND SOFT TISSUES: No acute osseous abnormality. IMPRESSION: 1. Progressive patchy right lower lobe opacity, suspicious for pneumonia. 2. Stable scarring/bronchiectasis in the left upper and lower lobes. Electronically signed by: Zadie Herter MD 08/14/2023 10:08 PM EDT RP Workstation: [id number]    HISTORICAL MICRO/IMAGING  Assessment/Plan:  53yo M with advanced hiv disease now with hypoxia, productive cough, found to have RLL pneumonia-- concern for bacterial vs. Pcp pneumonia.  - will continue on azithromycin  plus ceftriaxone . Plus add PCP treatment. - will also check for fungitell, pneumocystis DFA and RVP - continue on droplet precautions - in terms of HIV disease, he was well controlled as of march. Will continue on tivicay  and symtuza   Thrush = will start on fluconazole  200mg  po daily  COPD with exacerbation = steroids for possible PCP will also help with COPD   Aki = improved and we will continue to closely monitor with bactrim   More recs tomorrow as micro data returns  evaluation of this patient requires complex antimicrobial therapy evaluation and counseling and isolation needs for disease transmission risk assessment and mitigation.   Gerold Kos Levern Reader MD MPH [facility] [phone]

## 2023-08-17 DIAGNOSIS — B20 Human immunodeficiency virus [HIV] disease: Secondary | ICD-10-CM | POA: Diagnosis not present

## 2023-08-17 DIAGNOSIS — J189 Pneumonia, unspecified organism: Secondary | ICD-10-CM | POA: Diagnosis not present

## 2023-08-17 DIAGNOSIS — B370 Candidal stomatitis: Secondary | ICD-10-CM | POA: Diagnosis not present

## 2023-08-17 LAB — CBC WITH DIFFERENTIAL/PLATELET
Abs Immature Granulocytes: 0.06 10*3/uL (ref 0.00–0.07)
Basophils Absolute: 0 10*3/uL (ref 0.0–0.1)
Basophils Relative: 0 %
Eosinophils Absolute: 0 10*3/uL (ref 0.0–0.5)
Eosinophils Relative: 0 %
HCT: 37.6 % — ABNORMAL LOW (ref 39.0–52.0)
Hemoglobin: 12.5 g/dL — ABNORMAL LOW (ref 13.0–17.0)
Immature Granulocytes: 1 %
Lymphocytes Relative: 6 %
Lymphs Abs: 0.5 10*3/uL — ABNORMAL LOW (ref 0.7–4.0)
MCH: 32.1 pg (ref 26.0–34.0)
MCHC: 33.2 g/dL (ref 30.0–36.0)
MCV: 96.4 fL (ref 80.0–100.0)
Monocytes Absolute: 0.4 10*3/uL (ref 0.1–1.0)
Monocytes Relative: 4 %
Neutro Abs: 8.3 10*3/uL — ABNORMAL HIGH (ref 1.7–7.7)
Neutrophils Relative %: 89 %
Platelets: 209 10*3/uL (ref 150–400)
RBC: 3.9 MIL/uL — ABNORMAL LOW (ref 4.22–5.81)
RDW: 11.5 % (ref 11.5–15.5)
WBC: 9.3 10*3/uL (ref 4.0–10.5)
nRBC: 0 % (ref 0.0–0.2)

## 2023-08-17 LAB — HIV-1 RNA QUANT-NO REFLEX-BLD
HIV 1 RNA Quant: <20 {copies}/mL
LOG10 HIV-1 RNA: UNDETERMINED {Log_copies}/mL

## 2023-08-17 LAB — BASIC METABOLIC PANEL WITH GFR
Anion gap: 4 — ABNORMAL LOW (ref 5–15)
BUN: 25 mg/dL — ABNORMAL HIGH (ref 6–20)
CO2: 27 mmol/L (ref 22–32)
Calcium: 8.8 mg/dL — ABNORMAL LOW (ref 8.9–10.3)
Chloride: 102 mmol/L (ref 98–111)
Creatinine, Ser: 1.09 mg/dL (ref 0.61–1.24)
GFR, Estimated: >60 mL/min (ref >60)
Glucose, Bld: 254 mg/dL — ABNORMAL HIGH (ref 70–99)
Potassium: 4.5 mmol/L (ref 3.5–5.1)
Sodium: 133 mmol/L — ABNORMAL LOW (ref 135–145)

## 2023-08-17 LAB — BRAIN NATRIURETIC PEPTIDE: B Natriuretic Peptide: 157.6 pg/mL — ABNORMAL HIGH (ref 0.0–100.0)

## 2023-08-17 LAB — GLUCOSE, CAPILLARY
Glucose-Capillary: 359 mg/dL — ABNORMAL HIGH (ref 70–99)
Glucose-Capillary: 419 mg/dL — ABNORMAL HIGH (ref 70–99)
Glucose-Capillary: 311 mg/dL — ABNORMAL HIGH (ref 70–99)
Glucose-Capillary: 203 mg/dL — ABNORMAL HIGH (ref 70–99)
Glucose-Capillary: 162 mg/dL — ABNORMAL HIGH (ref 70–99)
Glucose-Capillary: 183 mg/dL — ABNORMAL HIGH (ref 70–99)
Glucose-Capillary: 347 mg/dL — ABNORMAL HIGH (ref 70–99)

## 2023-08-17 LAB — PROCALCITONIN: Procalcitonin: 0.11 ng/mL

## 2023-08-17 LAB — C-REACTIVE PROTEIN: CRP: 4.9 mg/dL — ABNORMAL HIGH (ref <1.0)

## 2023-08-17 LAB — MAGNESIUM: Magnesium: 2 mg/dL (ref 1.7–2.4)

## 2023-08-17 MED ORDER — INSULIN ASPART 100 UNIT/ML IJ SOLN
0.0000 [IU] | Freq: Every day | INTRAMUSCULAR | Status: DC
  Administered 2023-08-18: 3 [IU] via SUBCUTANEOUS

## 2023-08-17 MED ORDER — INSULIN ASPART 100 UNIT/ML IJ SOLN
0.0000 [IU] | Freq: Three times a day (TID) | INTRAMUSCULAR | Status: DC
  Administered 2023-08-17: 7 [IU] via SUBCUTANEOUS
  Administered 2023-08-17: 22 [IU] via SUBCUTANEOUS
  Administered 2023-08-17: 15 [IU] via SUBCUTANEOUS
  Administered 2023-08-18: 11 [IU] via SUBCUTANEOUS

## 2023-08-17 MED ORDER — INSULIN GLARGINE-YFGN 100 UNIT/ML ~~LOC~~ SOLN
25.0000 [IU] | Freq: Two times a day (BID) | SUBCUTANEOUS | Status: DC
  Administered 2023-08-17: 25 [IU] via SUBCUTANEOUS
  Filled 2023-08-17 (×3): qty 0.25

## 2023-08-17 NOTE — Inpatient Diabetes Management (Addendum)
 Inpatient Diabetes Program Recommendations  AACE/ADA: New Consensus Statement on Inpatient Glycemic Control (2015)  Target Ranges:  Prepandial:   less than 140 mg/dL      Peak postprandial:   less than 180 mg/dL (1-2 hours)      Critically ill patients:  140 - 180 mg/dL   Lab Results  Component Value Date   GLUCAP 419 (H) 08/17/2023   HGBA1C 10.8 (H) 08/15/2023    Review of Glycemic Control  Diabetes history: DM2 Outpatient Diabetes medications: Janumet  50/500 BID (has been out of this x 2 months Current orders for Inpatient glycemic control: Novolog  0-20 TID and 0-5 HS + 4 units TID, Semglee  25 BID, Pred 40 BID  HgbA1C - 10.8% CBGs 254, 359, 419  Per case manager, Pt's PCP is Dr [name].  Inpatient Diabetes Program Recommendations:    Consider increasing Novolog  to 6 units TID with meals if eating > 50%  Spoke with pt using interpreter about diabetes diagnosis and HgbA1C, Pt has hx DM2 and was on Janumet  at one time, but ran out and has not gotten refill in 2 months.  Discussed A1C results with him and explained what an A1C is, basic pathophysiology of DM Type 2, basic home care, importance of checking CBGs and maintaining good CBG control to prevent long-term and short-term complications. RNs to provide ongoing basic DM education at bedside with this patient. Discussed hypoglycemia s/s and treatment. Pt states he has used insulin  pen previously although our insulin  pen is a little different than what he was using. Does not have PCP, only sees Dr Levern Reader (infectious Disease). Pt has insurance and will need to obtain PCP to manage his diabetes. States he eats healthy most of the time. Does not skip meals, does not drink sodas, likes milk with cocoa, juices. Demonstrated insulin  pen use. eviewed all steps if insulin  pen including attachment of needle, 2-unit air shot, dialing up dose, giving injection, removing needle, disposal of sharps, storage of unused insulin , disposal of  insulin  etc. Patient able to provide successful return demonstration. Also reviewed troubleshooting with insulin  pen. MD to give patient Rxs for insulin  pens and insulin  pen needles.  Answered all questions.  Will ask RN to allow pt to give all insulin  injections and stick finger for CBG checks. Will get prescription for meter, insulin  pens and supplies. Will contact social worker regarding assistance with obtaining PCP.   Continue to follow.  Thank you. Joni Net, RD, LDN, CDCES Inpatient Diabetes Coordinator [phone]

## 2023-08-17 NOTE — Progress Notes (Signed)
 [facility]    Date of Admission:  08/14/2023   Total days of antibiotics 4   ID: [name] is a 53 y.o. male with   Active Problems:   HIV disease ([name])   Chronic systolic CHF (congestive heart failure) ([name])   Type 2 diabetes mellitus with hyperlipidemia ([name])   COPD (chronic obstructive pulmonary disease) with emphysema ([name])   CAP (community acquired pneumonia)   CKD stage 3a, GFR 45-59 ml/min ([name])    Subjective: Afebrile. Less coughing   Medications:   budesonide -glycopyrrolate -formoterol   1 puff Inhalation BID   Darunavir -Cobicistat -Emtricitabine -Tenofovir  Alafenamide  1 tablet Oral Q breakfast   docusate sodium   100 mg Oral BID   dolutegravir   50 mg Oral Daily   enoxaparin  (LOVENOX ) injection  40 mg Subcutaneous Daily   feeding supplement  237 mL Oral BID BM   fluconazole   200 mg Oral Daily   guaiFENesin   600 mg Oral BID   hydrALAZINE  50 mg Oral Q8H   insulin  aspart  0-20 Units Subcutaneous TID WC   insulin  aspart  0-5 Units Subcutaneous QHS   insulin  aspart  4 Units Subcutaneous TID WC   insulin  glargine-yfgn  25 Units Subcutaneous BID   ipratropium-albuterol   3 mL Nebulization BID   isosorbide mononitrate  15 mg Oral Daily   predniSONE   40 mg Oral BID WC   Followed by   Cecily Cohen ON 08/21/2023] predniSONE   40 mg Oral Q breakfast   Followed by   Cecily Cohen ON 08/26/2023] predniSONE   20 mg Oral Q breakfast   rosuvastatin   20 mg Oral Daily    Objective: Vital signs in last 24 hours: Temp:  [97.8 F (36.6 C)-98.2 F (36.8 C)] 98.2 F (36.8 C) (06/03 1639) Pulse Rate:  [77-100] 93 (06/03 1639) Resp:  [16-20] 20 (06/03 1639) BP: (111-123)/(69-78) 111/69 (06/03 1639) SpO2:  [89 %-100 %] 100 % (06/03 1639) Physical Exam  Constitutional: He is oriented to person, place, and time. He appears well-developed and well-nourished. No distress.  HENT:  Mouth/Throat: Oropharynx is clear and moist. No oropharyngeal exudate.   Cardiovascular: Normal rate, regular rhythm and normal heart sounds. Exam reveals no gallop and no friction rub.  No murmur heard.  Pulmonary/Chest: Effort normal and breath sounds normal. No respiratory distress. He has no wheezes.  Abdominal: Soft. Bowel sounds are normal. He exhibits no distension. There is no tenderness.  Lymphadenopathy:  He has no cervical adenopathy.  Neurological: He is alert and oriented to person, place, and time.  Skin: Skin is warm and dry. No rash noted. No erythema.  Psychiatric: He has a normal mood and affect. His behavior is normal.    Lab Results Recent Labs    08/16/23 0430 08/17/23 0431  WBC 16.3* 9.3  HGB 12.5* 12.5*  HCT 37.3* 37.6*  NA 133* 133*  K 4.4 4.5  CL 99 102  CO2 26 27  BUN 32* 25*  CREATININE 1.30* 1.09   Liver Panel Recent Labs    08/14/23 2144  PROT 8.8*  ALBUMIN 3.0*  AST 15  ALT 18  ALKPHOS 84  BILITOT 1.0   Sedimentation Rate No results for input(s): "ESRSEDRATE" in the last 72 hours. C-Reactive Protein Recent Labs    08/16/23 0855 08/17/23 0431  CRP 9.1* 4.9*    Microbiology: reviewed Studies/Results: ECHOCARDIOGRAM COMPLETE Result Date: 08/16/2023    ECHOCARDIOGRAM REPORT   Patient Name:   [name] Date of Exam: 08/16/2023 Medical Rec #:  [id number]          Height:       68.0 in Accession #:    [id number]         Weight:       185.2 lb Date of Birth:  Nov 25, 1970           BSA:          1.978 m Patient Age:    53 years           BP:           119/79 mmHg Patient Gender: M                  HR:           100 bpm. Exam Location:  Inpatient Procedure: 2D Echo, Cardiac Doppler and Color Doppler (Both Spectral and Color            Flow Doppler were utilized during procedure). Indications:    CHF  History:        Patient has prior history of Echocardiogram examinations, most                 recent 11/17/2021. CHF.  Sonographer:    Janette Medley Referring Phys: Theodora Fish IMPRESSIONS  1. Left ventricular  ejection fraction, by estimation, is 55%. The left ventricle has normal function. The left ventricle has no regional wall motion abnormalities. Left ventricular diastolic parameters are consistent with Grade I diastolic dysfunction (impaired relaxation).  2. Mildly D-shaped interventricular septum suggests a degree of RV pressure/volume overload. Right ventricular systolic function is mildly reduced. The right ventricular size is mildly enlarged. Tricuspid regurgitation signal is inadequate for assessing  PA pressure.  3. Right atrial size was mildly dilated. Chiari network noted in RA.  4. The mitral valve is normal in structure. No evidence of mitral valve regurgitation. No evidence of mitral stenosis.  5. The aortic valve is tricuspid. Aortic valve regurgitation is not visualized. No aortic stenosis is present.  6. Aortic dilatation noted.  7. The inferior vena cava is normal in size with greater than 50% respiratory variability, suggesting right atrial pressure of 3 mmHg. FINDINGS  Left Ventricle: Left ventricular ejection fraction, by estimation, is 55%. The left ventricle has normal function. The left ventricle has no regional wall motion abnormalities. The left ventricular internal cavity size was normal in size. There is no left ventricular hypertrophy. Left ventricular diastolic parameters are consistent with Grade I diastolic dysfunction (impaired relaxation). Right Ventricle: Mildly D-shaped interventricular septum suggests a degree of RV pressure/volume overload. The right ventricular size is mildly enlarged. No increase in right ventricular wall thickness. Right ventricular systolic function is mildly reduced. Tricuspid regurgitation signal is inadequate for assessing PA pressure. Left Atrium: Left atrial size was normal in size. Right Atrium: Right atrial size was mildly dilated. Pericardium: There is no evidence of pericardial effusion. Mitral Valve: The mitral valve is normal in structure. No  evidence of mitral valve regurgitation. No evidence of mitral valve stenosis. Tricuspid Valve: The tricuspid valve is normal in structure. Tricuspid valve regurgitation is not demonstrated. Aortic Valve: The aortic valve is tricuspid. Aortic valve regurgitation is not visualized. No aortic stenosis is present. Pulmonic Valve: The pulmonic valve was normal in structure. Pulmonic valve regurgitation is not visualized. Aorta: Aortic dilatation noted. Venous: The inferior vena cava is normal in size with greater than 50% respiratory variability, suggesting right atrial pressure of 3 mmHg. IAS/Shunts: No atrial level shunt detected  by color flow Doppler.  LEFT VENTRICLE PLAX 2D LVIDd:         5.00 cm   Diastology LVIDs:         3.20 cm   LV e' medial:    11.10 cm/s LV PW:         0.90 cm   LV E/e' medial:  7.9 LV IVS:        0.90 cm   LV e' lateral:   18.40 cm/s LVOT diam:     2.10 cm   LV E/e' lateral: 4.8 LV SV:         77 LV SV Index:   39 LVOT Area:     3.46 cm  RIGHT VENTRICLE             IVC RV S prime:     18.90 cm/s  IVC diam: 1.70 cm TAPSE (M-mode): 2.2 cm LEFT ATRIUM             Index        RIGHT ATRIUM           Index LA diam:        3.20 cm 1.62 cm/m   RA Area:     18.20 cm LA Vol (A2C):   28.8 ml 14.56 ml/m  RA Volume:   49.40 ml  24.97 ml/m LA Vol (A4C):   34.0 ml 17.19 ml/m LA Biplane Vol: 34.2 ml 17.29 ml/m  AORTIC VALVE LVOT Vmax:   133.00 cm/s LVOT Vmean:  93.300 cm/s LVOT VTI:    0.222 m  AORTA Ao Root diam: 2.80 cm Ao Asc diam:  3.20 cm MITRAL VALVE MV Area (PHT): 4.49 cm     SHUNTS MV Decel Time: 169 msec     Systemic VTI:  0.22 m MV E velocity: 87.50 cm/s   Systemic Diam: 2.10 cm MV A velocity: 114.00 cm/s MV E/A ratio:  0.77 Dalton McleanMD Electronically signed by Archer Bear Signature Date/Time: 08/16/2023/2:24:11 PM    Final    CT Angio Chest Pulmonary Embolism (PE) W or WO Contrast Result Date: 08/16/2023 CLINICAL DATA:  Suspected pulmonary embolism EXAM: CT ANGIOGRAPHY CHEST WITH  CONTRAST TECHNIQUE: Multidetector CT imaging of the chest was performed using the standard protocol during bolus administration of intravenous contrast. Multiplanar CT image reconstructions and MIPs were obtained to evaluate the vascular anatomy. RADIATION DOSE REDUCTION: This exam was performed according to the departmental dose-optimization program which includes automated exposure control, adjustment of the mA and/or kV according to patient size and/or use of iterative reconstruction technique. CONTRAST:  75mL OMNIPAQUE  IOHEXOL  350 MG/ML SOLN COMPARISON:  CT angio August 19, 2022 FINDINGS: Cardiovascular: Satisfactory opacification of the pulmonary arteries to the segmental level. No evidence of pulmonary embolism. Normal heart size. No pericardial effusion. Mediastinum/Nodes: No enlarged mediastinal, hilar, or axillary lymph nodes. Thyroid gland, trachea, and esophagus demonstrate no significant findings. Lungs/Pleura: Comparison with prior examination accounting for differences in techniques no significant change in the chronic interstitial lung disease, with fibrotic changes particularly involving the left lung, fibro atelectatic changes of both upper lobes as well as bronchiectatic changes of the bases of the lungs. There is however, some consolidation within the right lower lobe which correlates with a superimposed pneumonia. There is no suspicious pulmonary nodules. Upper Abdomen: No acute abnormality. Musculoskeletal: No chest wall abnormality. No acute or significant osseous findings. Review of the MIP images confirms the above findings. IMPRESSION: *No evidence of pulmonary embolism. *Chronic interstitial lung disease with fibrotic  changes particularly involving the left lung, fibro atelectatic changes of both upper lobes as well as bronchiectatic changes of the bases of the lungs. *There is however, some consolidation within the right lower lobe which correlates with a superimposed pneumonia.  Electronically Signed   By: Fredrich Jefferson M.D.   On: 08/16/2023 08:40     Assessment/Plan: Hiv disease= continue on tivicay  and symtuza   Pneumonia = continue on ceftriaxone  plus bactrim   Thrush = continue on fluconazole   [facility] Pager: [phone]  08/17/2023, 6:23 PM

## 2023-08-17 NOTE — Plan of Care (Signed)
   Problem: Fluid Volume: Goal: Ability to maintain a balanced intake and output will improve Outcome: Progressing   Problem: Metabolic: Goal: Ability to maintain appropriate glucose levels will improve Outcome: Progressing   Problem: Nutritional: Goal: Maintenance of adequate nutrition will improve Outcome: Progressing

## 2023-08-17 NOTE — Plan of Care (Signed)

## 2023-08-17 NOTE — Progress Notes (Signed)
 PROGRESS NOTE                                                                                                                                                                                                             Patient Demographics:    [name], is a 53 y.o. male, DOB - 25-Jul-1970, UJW:[id number]  Outpatient Primary MD for the patient is Alexander-Savino, [name], [place]    LOS - 2  Admit date - 08/14/2023    Chief Complaint  Patient presents with   Shortness of Breath       Brief Narrative (HPI from H&P)   53 y.o. male with medical history significant of bronchiectasis, COPD on home O2, HIV, type 2 diabetes mellitus, CHF, colon cancer status post resection in 2020, history of pulm embolism.  He presented to the emergency room with complaints of worsening productive cough and shortness of breath over the past 4 days, diagnosed with acute on chronic hypoxic respiratory failure due to pneumonia and admitted to the hospital   Subjective:   Patient in bed, appears comfortable, denies any headache, no fever, no chest pain or pressure, no shortness of breath , no abdominal pain. No new focal weakness.   Assessment  & Plan :    Chronic hypoxic respiratory failure in a patient with underlying COPD, bronchiectasis and HIV due to CAP (community acquired pneumonia)  Has been placed on appropriate empiric antibiotics, encouraged to sit in chair use I-S and flutter valve for pulmonary toiletry, supplemental oxygen, of note he uses 2 to 3 L of home oxygen, currently on 4, follow cultures, advance activity and titrate down oxygen.  COPD (chronic obstructive pulmonary disease) with emphysema ([name]) and underlying bronchiectasis. Advanced underlying disease, on 2 to 3 L nasal cannula oxygen at home, currently see above, continue supplemental oxygen and home nebulizer treatments, has no wheezing will discontinue systemic steroids  and monitor.  Chronic diastolic CHF.  Last EF is 50% on echocardiogram done in 2023. Clear euvolemic.  Due to recent AKI will place him on Imdur and hydralazine combination for blood pressure control and CHF.  AKI CKD stage 3a, GFR 45-59 ml/min ([name]) Renal function improved, hold ARB, monitor.  Baseline creatinine close to 1.5.  Elevated D-dimer.  Likely due to #1 above, CTA negative, lower extremity venous duplex pending.  HIV disease.  Question compliance, in the past had been in poor control, continue antiretroviral therapy along with Bactrim  for PJP prophylaxis, low CD4 count , pending HIV viral load, ID input.    Type 2 diabetes mellitus with hyperlipidemia ([name]) Poor outpatient control as suggested by elevated A1c, had DKA upon admission, due to steroids sugar running high, insulin  adjusted, steroids are now off, monitor closely.  DM and insulin  education.  Lab Results  Component Value Date   HGBA1C 10.8 (H) 08/15/2023   CBG (last 3)  Recent Labs    08/16/23 1141 08/16/23 1613 08/16/23 2310  GLUCAP 360* 230* 267*          Condition - Extremely Guarded  Family Communication  :  None  Code Status :  Full  Consults  :  ID  PUD Prophylaxis :     Procedures  :     CTA -   1. No pulmonary emboli or acute abnormality. 2. Stable changes of COPD with centrilobular and paraseptal emphysema. 3. Stable areas of bronchiectasis in the left lung with improved mucous plugging and wall thickening. 4. Improved area of residual consolidation in the inferior right lower lobe with a small amount of residual atelectasis or scarring. 5. Stable mildly enlarged bilateral hilar lymph nodes, most likely reactive. 6. Small hiatal hernia.      Disposition Plan  :    Status is: Inpatient   DVT Prophylaxis  :    enoxaparin  (LOVENOX ) injection 40 mg Start: 08/15/23 1000 SCDs Start: 08/15/23 0981      Lab Results  Component Value Date   PLT 209 08/17/2023    Diet :  Diet  Order             Diet heart healthy/carb modified Fluid consistency: Thin; Fluid restriction: 1500 mL Fluid  Diet effective now                    Inpatient Medications  Scheduled Meds:  budesonide -glycopyrrolate -formoterol   1 puff Inhalation BID   Darunavir -Cobicistat -Emtricitabine -Tenofovir  Alafenamide  1 tablet Oral Q breakfast   docusate sodium   100 mg Oral BID   dolutegravir   50 mg Oral Daily   enoxaparin  (LOVENOX ) injection  40 mg Subcutaneous Daily   feeding supplement  237 mL Oral BID BM   fluconazole   200 mg Oral Daily   guaiFENesin   600 mg Oral BID   hydrALAZINE  50 mg Oral Q8H   insulin  aspart  0-15 Units Subcutaneous TID WC   insulin  aspart  0-5 Units Subcutaneous QHS   insulin  aspart  4 Units Subcutaneous TID WC   insulin  glargine-yfgn  20 Units Subcutaneous BID   ipratropium-albuterol   3 mL Nebulization BID   isosorbide mononitrate  15 mg Oral Daily   predniSONE   40 mg Oral BID WC   Followed by   Cecily Cohen ON 08/21/2023] predniSONE   40 mg Oral Q breakfast   Followed by   Cecily Cohen ON 08/26/2023] predniSONE   20 mg Oral Q breakfast   rosuvastatin   20 mg Oral Daily   Continuous Infusions:  azithromycin  500 mg (08/17/23 0014)   cefTRIAXone  (ROCEPHIN )  IV 1 g (08/16/23 2324)   sulfamethoxazole -trimethoprim  320 mg of trimethoprim  (08/17/23 1914)   PRN Meds:.acetaminophen  **OR** acetaminophen , albuterol , benzonatate , ondansetron  **OR** ondansetron  (ZOFRAN ) IV    Objective:   Vitals:   08/16/23 2000 08/16/23 2312 08/17/23 0000 08/17/23 0321  BP: 121/75 118/75 123/74 118/71  Pulse: 83 86 77   Resp: 16 19 18    Temp: 98.1  F (36.7 C)   97.8 F (36.6 C)  TempSrc: Oral   Oral  SpO2: 92% 90% 92%   Weight:      Height:        Wt Readings from Last 3 Encounters:  08/14/23 84 kg  06/09/23 84.1 kg  06/04/23 83 kg     Intake/Output Summary (Last 24 hours) at 08/17/2023 0801 Last data filed at 08/17/2023 9811 Gross per 24 hour  Intake 1090 ml  Output 3200 ml   Net -2110 ml     Physical Exam  Awake Alert, No new F.N deficits, Normal affect [place].AT,PERRAL Supple Neck, No JVD,   Symmetrical Chest wall movement, Good air movement bilaterally, CTAB RRR,No Gallops,Rubs or new Murmurs,  +ve B.Sounds, Abd Soft, No tenderness,   No Cyanosis, Clubbing or edema     Data Review:    Recent Labs  Lab 08/14/23 2144 08/15/23 0010 08/15/23 0358 08/16/23 0430 08/17/23 0431  WBC 9.1  --  7.8 16.3* 9.3  HGB 14.7 15.0 13.8 12.5* 12.5*  HCT 43.6 44.0 41.6 37.3* 37.6*  PLT 211  --  203 214 209  MCV 94.2  --  97.2 96.1 96.4  MCH 31.7  --  32.2 32.2 32.1  MCHC 33.7  --  33.2 33.5 33.2  RDW 11.3*  --  11.4* 11.4* 11.5  LYMPHSABS 1.7  --   --   --  0.5*  MONOABS 0.5  --   --   --  0.4  EOSABS 0.0  --   --   --  0.0  BASOSABS 0.0  --   --   --  0.0    Recent Labs  Lab 08/14/23 2144 08/14/23 2158 08/14/23 2159 08/14/23 2217 08/15/23 0010 08/15/23 0358 08/15/23 1437 08/16/23 0430 08/16/23 0855 08/17/23 0431  NA 130*  --   --   --  130* 134* 134* 133*  --  133*  K 4.4  --   --   --  4.7 5.1 4.7 4.4  --  4.5  CL 89*  --   --   --   --  96* 97* 99  --  102  CO2 25  --   --   --   --  22 26 26   --  27  ANIONGAP 16*  --   --   --   --  16* 11 8  --  4*  GLUCOSE 599*  --   --   --   --  578* 329* 287*  --  254*  BUN 30*  --   --   --   --  32* 28* 32*  --  25*  CREATININE 1.59*  --   --   --   --  1.63* 1.27* 1.30*  --  1.09  AST 15  --   --   --   --   --   --   --   --   --   ALT 18  --   --   --   --   --   --   --   --   --   ALKPHOS 84  --   --   --   --   --   --   --   --   --   BILITOT 1.0  --   --   --   --   --   --   --   --   --  ALBUMIN 3.0*  --   --   --   --   --   --   --   --   --   CRP  --   --   --   --   --   --   --   --  9.1* 4.9*  DDIMER  --   --  0.59*  --   --   --   --   --   --   --   PROCALCITON  --   --   --   --   --   --   --   --  <0.10 0.11  LATICACIDVEN  --   --   --  1.4  --   --   --   --   --   --    HGBA1C  --   --   --   --   --  10.8*  --   --   --   --   BNP  --  12.9  --   --   --   --   --   --   --  157.6*  MG  --   --   --   --   --   --   --   --   --  2.0  CALCIUM  10.0  --   --   --   --  9.2 9.1 8.9  --  8.8*      Recent Labs  Lab 08/14/23 2144 08/14/23 2158 08/14/23 2159 08/14/23 2217 08/15/23 0358 08/15/23 1437 08/16/23 0430 08/16/23 0855 08/17/23 0431  CRP  --   --   --   --   --   --   --  9.1* 4.9*  DDIMER  --   --  0.59*  --   --   --   --   --   --   PROCALCITON  --   --   --   --   --   --   --  <0.10 0.11  LATICACIDVEN  --   --   --  1.4  --   --   --   --   --   HGBA1C  --   --   --   --  10.8*  --   --   --   --   BNP  --  12.9  --   --   --   --   --   --  157.6*  MG  --   --   --   --   --   --   --   --  2.0  CALCIUM  10.0  --   --   --  9.2 9.1 8.9  --  8.8*    --------------------------------------------------------------------------------------------------------------- Lab Results  Component Value Date   CHOL 194 03/17/2022   HDL 52 03/17/2022   LDLCALC 119 (H) 03/17/2022   TRIG 115 03/17/2022   CHOLHDL 3.7 03/17/2022    Lab Results  Component Value Date   HGBA1C 10.8 (H) 08/15/2023   No results for input(s): "TSH", "T4TOTAL", "FREET4", "T3FREE", "THYROIDAB" in the last 72 hours. No results for input(s): "VITAMINB12", "FOLATE", "FERRITIN", "TIBC", "IRON", "RETICCTPCT" in the last 72 hours. ------------------------------------------------------------------------------------------------------------------ Cardiac Enzymes No results for input(s): "CKMB", "TROPONINI", "MYOGLOBIN" in the last 168 hours.  Invalid input(s): "CK"  Micro Results Recent Results (from the past 240 hours)  Resp panel by  RT-PCR (RSV, Flu A&B, Covid) Anterior Nasal Swab     Status: None   Collection Time: 08/14/23  9:44 PM   Specimen: Anterior Nasal Swab  Result Value Ref Range Status   SARS Coronavirus 2 by RT PCR NEGATIVE NEGATIVE Final   Influenza A by PCR  NEGATIVE NEGATIVE Final   Influenza B by PCR NEGATIVE NEGATIVE Final    Comment: (NOTE) The Xpert Xpress SARS-CoV-2/FLU/RSV plus assay is intended as an aid in the diagnosis of influenza from Nasopharyngeal swab specimens and should not be used as a sole basis for treatment. Nasal washings and aspirates are unacceptable for Xpert Xpress SARS-CoV-2/FLU/RSV testing.  Fact Sheet for Patients: BloggerCourse.com  Fact Sheet for Healthcare Providers: SeriousBroker.it  This test is not yet approved or cleared by the [place]  FDA and has been authorized for detection and/or diagnosis of SARS-CoV-2 by FDA under an Emergency Use Authorization (EUA). This EUA will remain in effect (meaning this test can be used) for the duration of the COVID-19 declaration under Section 564(b)(1) of the Act, 21 U.S.C. section 360bbb-3(b)(1), unless the authorization is terminated or revoked.     Resp Syncytial Virus by PCR NEGATIVE NEGATIVE Final    Comment: (NOTE) Fact Sheet for Patients: BloggerCourse.com  Fact Sheet for Healthcare Providers: SeriousBroker.it  This test is not yet approved or cleared by the [place]  FDA and has been authorized for detection and/or diagnosis of SARS-CoV-2 by FDA under an Emergency Use Authorization (EUA). This EUA will remain in effect (meaning this test can be used) for the duration of the COVID-19 declaration under Section 564(b)(1) of the Act, 21 U.S.C. section 360bbb-3(b)(1), unless the authorization is terminated or revoked.  Performed at [facility], [address] [place], [place] [address]   Culture, blood (routine x 2)     Status: None (Preliminary result)   Collection Time: 08/14/23 10:00 PM   Specimen: BLOOD  Result Value Ref Range Status   Specimen Description BLOOD LEFT ARM  Final   Special Requests   Final    BOTTLES DRAWN AEROBIC  AND ANAEROBIC Blood Culture results may not be optimal due to an inadequate volume of blood received in culture bottles   Culture   Final    NO GROWTH 2 DAYS Performed at [facility], [address], [place], [place] [address]    Report Status PENDING  Incomplete  Culture, blood (routine x 2)     Status: None (Preliminary result)   Collection Time: 08/14/23 10:05 PM   Specimen: BLOOD RIGHT HAND  Result Value Ref Range Status   Specimen Description BLOOD RIGHT HAND  Final   Special Requests   Final    BOTTLES DRAWN AEROBIC AND ANAEROBIC Blood Culture results may not be optimal due to an inadequate volume of blood received in culture bottles   Culture   Final    NO GROWTH 2 DAYS Performed at [facility], [address], [place], [place] [address]    Report Status PENDING  Incomplete    Radiology Report ECHOCARDIOGRAM COMPLETE Result Date: 08/16/2023    ECHOCARDIOGRAM REPORT   Patient Name:   DMARCO BALDUS Date of Exam: 08/16/2023 Medical Rec #:  [id number]          Height:       68.0 in Accession #:    [id number]         Weight:       185.2 lb Date of Birth:  06-17-1970  BSA:          1.978 m Patient Age:    53 years           BP:           119/79 mmHg Patient Gender: M                  HR:           100 bpm. Exam Location:  Inpatient Procedure: 2D Echo, Cardiac Doppler and Color Doppler (Both Spectral and Color            Flow Doppler were utilized during procedure). Indications:    CHF  History:        Patient has prior history of Echocardiogram examinations, most                 recent 11/17/2021. CHF.  Sonographer:    Janette Medley Referring Phys: Theodora Fish IMPRESSIONS  1. Left ventricular ejection fraction, by estimation, is 55%. The left ventricle has normal function. The left ventricle has no regional wall motion abnormalities. Left ventricular diastolic parameters are consistent with Grade I diastolic dysfunction (impaired relaxation).  2. Mildly D-shaped  interventricular septum suggests a degree of RV pressure/volume overload. Right ventricular systolic function is mildly reduced. The right ventricular size is mildly enlarged. Tricuspid regurgitation signal is inadequate for assessing  PA pressure.  3. Right atrial size was mildly dilated. Chiari network noted in RA.  4. The mitral valve is normal in structure. No evidence of mitral valve regurgitation. No evidence of mitral stenosis.  5. The aortic valve is tricuspid. Aortic valve regurgitation is not visualized. No aortic stenosis is present.  6. Aortic dilatation noted.  7. The inferior vena cava is normal in size with greater than 50% respiratory variability, suggesting right atrial pressure of 3 mmHg. FINDINGS  Left Ventricle: Left ventricular ejection fraction, by estimation, is 55%. The left ventricle has normal function. The left ventricle has no regional wall motion abnormalities. The left ventricular internal cavity size was normal in size. There is no left ventricular hypertrophy. Left ventricular diastolic parameters are consistent with Grade I diastolic dysfunction (impaired relaxation). Right Ventricle: Mildly D-shaped interventricular septum suggests a degree of RV pressure/volume overload. The right ventricular size is mildly enlarged. No increase in right ventricular wall thickness. Right ventricular systolic function is mildly reduced. Tricuspid regurgitation signal is inadequate for assessing PA pressure. Left Atrium: Left atrial size was normal in size. Right Atrium: Right atrial size was mildly dilated. Pericardium: There is no evidence of pericardial effusion. Mitral Valve: The mitral valve is normal in structure. No evidence of mitral valve regurgitation. No evidence of mitral valve stenosis. Tricuspid Valve: The tricuspid valve is normal in structure. Tricuspid valve regurgitation is not demonstrated. Aortic Valve: The aortic valve is tricuspid. Aortic valve regurgitation is not visualized.  No aortic stenosis is present. Pulmonic Valve: The pulmonic valve was normal in structure. Pulmonic valve regurgitation is not visualized. Aorta: Aortic dilatation noted. Venous: The inferior vena cava is normal in size with greater than 50% respiratory variability, suggesting right atrial pressure of 3 mmHg. IAS/Shunts: No atrial level shunt detected by color flow Doppler.  LEFT VENTRICLE PLAX 2D LVIDd:         5.00 cm   Diastology LVIDs:         3.20 cm   LV e' medial:    11.10 cm/s LV PW:         0.90 cm  LV E/e' medial:  7.9 LV IVS:        0.90 cm   LV e' lateral:   18.40 cm/s LVOT diam:     2.10 cm   LV E/e' lateral: 4.8 LV SV:         77 LV SV Index:   39 LVOT Area:     3.46 cm  RIGHT VENTRICLE             IVC RV S prime:     18.90 cm/s  IVC diam: 1.70 cm TAPSE (M-mode): 2.2 cm LEFT ATRIUM             Index        RIGHT ATRIUM           Index LA diam:        3.20 cm 1.62 cm/m   RA Area:     18.20 cm LA Vol (A2C):   28.8 ml 14.56 ml/m  RA Volume:   49.40 ml  24.97 ml/m LA Vol (A4C):   34.0 ml 17.19 ml/m LA Biplane Vol: 34.2 ml 17.29 ml/m  AORTIC VALVE LVOT Vmax:   133.00 cm/s LVOT Vmean:  93.300 cm/s LVOT VTI:    0.222 m  AORTA Ao Root diam: 2.80 cm Ao Asc diam:  3.20 cm MITRAL VALVE MV Area (PHT): 4.49 cm     SHUNTS MV Decel Time: 169 msec     Systemic VTI:  0.22 m MV E velocity: 87.50 cm/s   Systemic Diam: 2.10 cm MV A velocity: 114.00 cm/s MV E/A ratio:  0.77 Dalton McleanMD Electronically signed by Archer Bear Signature Date/Time: 08/16/2023/2:24:11 PM    Final    CT Angio Chest Pulmonary Embolism (PE) W or WO Contrast Result Date: 08/16/2023 CLINICAL DATA:  Suspected pulmonary embolism EXAM: CT ANGIOGRAPHY CHEST WITH CONTRAST TECHNIQUE: Multidetector CT imaging of the chest was performed using the standard protocol during bolus administration of intravenous contrast. Multiplanar CT image reconstructions and MIPs were obtained to evaluate the vascular anatomy. RADIATION DOSE REDUCTION: This  exam was performed according to the departmental dose-optimization program which includes automated exposure control, adjustment of the mA and/or kV according to patient size and/or use of iterative reconstruction technique. CONTRAST:  75mL OMNIPAQUE  IOHEXOL  350 MG/ML SOLN COMPARISON:  CT angio August 19, 2022 FINDINGS: Cardiovascular: Satisfactory opacification of the pulmonary arteries to the segmental level. No evidence of pulmonary embolism. Normal heart size. No pericardial effusion. Mediastinum/Nodes: No enlarged mediastinal, hilar, or axillary lymph nodes. Thyroid gland, trachea, and esophagus demonstrate no significant findings. Lungs/Pleura: Comparison with prior examination accounting for differences in techniques no significant change in the chronic interstitial lung disease, with fibrotic changes particularly involving the left lung, fibro atelectatic changes of both upper lobes as well as bronchiectatic changes of the bases of the lungs. There is however, some consolidation within the right lower lobe which correlates with a superimposed pneumonia. There is no suspicious pulmonary nodules. Upper Abdomen: No acute abnormality. Musculoskeletal: No chest wall abnormality. No acute or significant osseous findings. Review of the MIP images confirms the above findings. IMPRESSION: *No evidence of pulmonary embolism. *Chronic interstitial lung disease with fibrotic changes particularly involving the left lung, fibro atelectatic changes of both upper lobes as well as bronchiectatic changes of the bases of the lungs. *There is however, some consolidation within the right lower lobe which correlates with a superimposed pneumonia. Electronically Signed   By: Fredrich Jefferson M.D.   On: 08/16/2023 08:40  Signature  -   Lynnwood Sauer M.D on 08/17/2023 at 8:01 AM   -  To page go to [email or web address]

## 2023-08-18 ENCOUNTER — Encounter (HOSPITAL_COMMUNITY)

## 2023-08-18 ENCOUNTER — Ambulatory Visit: Admitting: Internal Medicine

## 2023-08-18 DIAGNOSIS — J189 Pneumonia, unspecified organism: Secondary | ICD-10-CM | POA: Diagnosis not present

## 2023-08-18 LAB — CBC WITH DIFFERENTIAL/PLATELET
Abs Immature Granulocytes: 0.04 10*3/uL (ref 0.00–0.07)
Basophils Absolute: 0 10*3/uL (ref 0.0–0.1)
Basophils Relative: 0 %
Eosinophils Absolute: 0 10*3/uL (ref 0.0–0.5)
Eosinophils Relative: 0 %
HCT: 39.7 % (ref 39.0–52.0)
Hemoglobin: 13.3 g/dL (ref 13.0–17.0)
Immature Granulocytes: 1 %
Lymphocytes Relative: 9 %
Lymphs Abs: 0.6 10*3/uL — ABNORMAL LOW (ref 0.7–4.0)
MCH: 31.5 pg (ref 26.0–34.0)
MCHC: 33.5 g/dL (ref 30.0–36.0)
MCV: 94.1 fL (ref 80.0–100.0)
Monocytes Absolute: 0.5 10*3/uL (ref 0.1–1.0)
Monocytes Relative: 7 %
Neutro Abs: 5.7 10*3/uL (ref 1.7–7.7)
Neutrophils Relative %: 83 %
Platelets: 221 10*3/uL (ref 150–400)
RBC: 4.22 MIL/uL (ref 4.22–5.81)
RDW: 11.5 % (ref 11.5–15.5)
WBC: 6.9 10*3/uL (ref 4.0–10.5)
nRBC: 0 % (ref 0.0–0.2)

## 2023-08-18 LAB — GLUCOSE, CAPILLARY
Glucose-Capillary: 384 mg/dL — ABNORMAL HIGH (ref 70–99)
Glucose-Capillary: 268 mg/dL — ABNORMAL HIGH (ref 70–99)
Glucose-Capillary: 100 mg/dL — ABNORMAL HIGH (ref 70–99)
Glucose-Capillary: 104 mg/dL — ABNORMAL HIGH (ref 70–99)
Glucose-Capillary: 235 mg/dL — ABNORMAL HIGH (ref 70–99)
Glucose-Capillary: 251 mg/dL — ABNORMAL HIGH (ref 70–99)

## 2023-08-18 LAB — OSMOLALITY: Osmolality: 304 mosm/kg — ABNORMAL HIGH (ref 275–295)

## 2023-08-18 LAB — BASIC METABOLIC PANEL WITH GFR
Anion gap: 8 (ref 5–15)
BUN: 28 mg/dL — ABNORMAL HIGH (ref 6–20)
CO2: 27 mmol/L (ref 22–32)
Calcium: 9.3 mg/dL (ref 8.9–10.3)
Chloride: 95 mmol/L — ABNORMAL LOW (ref 98–111)
Creatinine, Ser: 1.17 mg/dL (ref 0.61–1.24)
GFR, Estimated: >60 mL/min (ref >60)
Glucose, Bld: 345 mg/dL — ABNORMAL HIGH (ref 70–99)
Potassium: 4.7 mmol/L (ref 3.5–5.1)
Sodium: 130 mmol/L — ABNORMAL LOW (ref 135–145)

## 2023-08-18 LAB — BRAIN NATRIURETIC PEPTIDE: B Natriuretic Peptide: 76.9 pg/mL (ref 0.0–100.0)

## 2023-08-18 LAB — PROCALCITONIN: Procalcitonin: <0.1 ng/mL

## 2023-08-18 LAB — URIC ACID: Uric Acid, Serum: 2.9 mg/dL — ABNORMAL LOW (ref 3.7–8.6)

## 2023-08-18 LAB — MAGNESIUM: Magnesium: 1.9 mg/dL (ref 1.7–2.4)

## 2023-08-18 LAB — LACTATE DEHYDROGENASE: LDH: 133 U/L (ref 98–192)

## 2023-08-18 LAB — C-REACTIVE PROTEIN: CRP: 2.5 mg/dL — ABNORMAL HIGH (ref <1.0)

## 2023-08-18 MED ORDER — INSULIN GLARGINE-YFGN 100 UNIT/ML ~~LOC~~ SOLN
35.0000 [IU] | Freq: Two times a day (BID) | SUBCUTANEOUS | Status: DC
  Administered 2023-08-18 – 2023-08-20 (×6): 35 [IU] via SUBCUTANEOUS
  Filled 2023-08-18 (×8): qty 0.35

## 2023-08-18 MED ORDER — INSULIN ASPART 100 UNIT/ML IJ SOLN
5.0000 [IU] | Freq: Three times a day (TID) | INTRAMUSCULAR | Status: DC
  Administered 2023-08-18 (×3): 5 [IU] via SUBCUTANEOUS

## 2023-08-18 MED ORDER — SULFAMETHOXAZOLE-TRIMETHOPRIM 800-160 MG PO TABS
1.0000 | ORAL_TABLET | Freq: Every day | ORAL | Status: DC
  Administered 2023-08-19 – 2023-08-21 (×3): 1 via ORAL
  Filled 2023-08-18 (×3): qty 1

## 2023-08-18 NOTE — Progress Notes (Signed)
 [facility]  Date of Admission:  08/14/2023     Reason for Follow Up: CAP (community acquired pneumonia)  Total days of antibiotics 5         ASSESSMENT:  [name] blood cultures remain without growth in the setting of community-acquired pneumonia and thrush with well-controlled AIDS.  Tolerating ceftriaxone  and fluconazole  with no adverse side effects.  Viral load is undetectable with CD4 count 58.  Remains at risk for opportunistic infection we will continue with current dose of sulfamethoxazole  trimethoprim  for OI prophylaxis.  Continue to monitor blood culture for bacteremia.  Fungitell pending.  Thrush appears to be resolving with fluconazole .  Continue current dose of ceftriaxone  and fluconazole  and Tivicay  for ART.  Therapeutic drug monitoring of renal function while on Bactrim . Droplet precaution.  Remaining medical and supportive care per internal medicine.   PLAN:  Continue current dose of ceftriaxone  for pneumonia Current dose of fluconazole  for thrush Monitor blood cultures for bacteremia until finalized. Continue Symtuza  and Tivicay  for ART and reduce Bactrim  for OI prophylaxis. Droplet precaution. Remaining medical and supportive care per internal medicine.  Principal Problem:   CAP (community acquired pneumonia) Active Problems:   HIV disease ([name])   Chronic systolic CHF (congestive heart failure) ([name])   Type 2 diabetes mellitus with hyperlipidemia ([name])   COPD (chronic obstructive pulmonary disease) with emphysema ([name])   CKD stage 3a, GFR 45-59 ml/min ([name])    budesonide -glycopyrrolate -formoterol   1 puff Inhalation BID   Darunavir -Cobicistat -Emtricitabine -Tenofovir  Alafenamide  1 tablet Oral Q breakfast   docusate sodium   100 mg Oral BID   dolutegravir   50 mg Oral Daily   enoxaparin  (LOVENOX ) injection  40 mg Subcutaneous Daily   feeding supplement  237 mL Oral BID BM   fluconazole   200 mg Oral Daily   guaiFENesin   600 mg  Oral BID   hydrALAZINE  50 mg Oral Q8H   insulin  aspart  0-20 Units Subcutaneous TID WC   insulin  aspart  0-5 Units Subcutaneous QHS   insulin  aspart  5 Units Subcutaneous TID WC   insulin  glargine-yfgn  35 Units Subcutaneous BID   ipratropium-albuterol   3 mL Nebulization BID   isosorbide mononitrate  15 mg Oral Daily   predniSONE   40 mg Oral BID WC   Followed by   Cecily Cohen ON 08/21/2023] predniSONE   40 mg Oral Q breakfast   Followed by   Cecily Cohen ON 08/26/2023] predniSONE   20 mg Oral Q breakfast   rosuvastatin   20 mg Oral Daily    SUBJECTIVE:  Afebrile overnight with no acute events.  Feeling better.  Has concerns about being able to obtain all medications at the same time.  Allergies  Allergen Reactions   Metformin  And Related Rash     Review of Systems: Review of Systems  Constitutional:  Negative for chills, fever and weight loss.  Respiratory:  Positive for cough and wheezing.   Cardiovascular:  Negative for chest pain and leg swelling.  Gastrointestinal:  Negative for abdominal pain, constipation, diarrhea, nausea and vomiting.  Skin:  Negative for rash.      OBJECTIVE: Vitals:   08/18/23 0707 08/18/23 0800 08/18/23 1200 08/18/23 1321  BP: (!) 155/100 122/79 100/74 100/74  Pulse:  (!) 105 87   Resp:  19 15   Temp:  98.3 F (36.8 C) 97.6 F (36.4 C)   TempSrc:  Oral Oral   SpO2:  100% 100%   Weight:      Height:  Body mass index is 28.16 kg/m.  Physical Exam Constitutional:      General: He is not in acute distress.    Appearance: He is well-developed.     Interventions: Nasal cannula in place.  Cardiovascular:     Rate and Rhythm: Normal rate and regular rhythm.     Heart sounds: Normal heart sounds.  Pulmonary:     Effort: Pulmonary effort is normal.     Breath sounds: Wheezing present.  Skin:    General: Skin is warm and dry.  Neurological:     Mental Status: He is alert and oriented to person, place, and time.  Psychiatric:        Behavior:  Behavior normal.        Thought Content: Thought content normal.        Judgment: Judgment normal.     Lab Results Lab Results  Component Value Date   WBC 6.9 08/18/2023   HGB 13.3 08/18/2023   HCT 39.7 08/18/2023   MCV 94.1 08/18/2023   PLT 221 08/18/2023    Lab Results  Component Value Date   CREATININE 1.17 08/18/2023   BUN 28 (H) 08/18/2023   NA 130 (L) 08/18/2023   K 4.7 08/18/2023   CL 95 (L) 08/18/2023   CO2 27 08/18/2023    Lab Results  Component Value Date   ALT 18 08/14/2023   AST 15 08/14/2023   ALKPHOS 84 08/14/2023   BILITOT 1.0 08/14/2023     Microbiology: Recent Results (from the past 240 hours)  Resp panel by RT-PCR (RSV, Flu A&B, Covid) Anterior Nasal Swab     Status: None   Collection Time: 08/14/23  9:44 PM   Specimen: Anterior Nasal Swab  Result Value Ref Range Status   SARS Coronavirus 2 by RT PCR NEGATIVE NEGATIVE Final   Influenza A by PCR NEGATIVE NEGATIVE Final   Influenza B by PCR NEGATIVE NEGATIVE Final    Comment: (NOTE) The Xpert Xpress SARS-CoV-2/FLU/RSV plus assay is intended as an aid in the diagnosis of influenza from Nasopharyngeal swab specimens and should not be used as a sole basis for treatment. Nasal washings and aspirates are unacceptable for Xpert Xpress SARS-CoV-2/FLU/RSV testing.  Fact Sheet for Patients: BloggerCourse.com  Fact Sheet for Healthcare Providers: SeriousBroker.it  This test is not yet approved or cleared by the [place]  FDA and has been authorized for detection and/or diagnosis of SARS-CoV-2 by FDA under an Emergency Use Authorization (EUA). This EUA will remain in effect (meaning this test can be used) for the duration of the COVID-19 declaration under Section 564(b)(1) of the Act, 21 U.S.C. section 360bbb-3(b)(1), unless the authorization is terminated or revoked.     Resp Syncytial Virus by PCR NEGATIVE NEGATIVE Final    Comment:  (NOTE) Fact Sheet for Patients: BloggerCourse.com  Fact Sheet for Healthcare Providers: SeriousBroker.it  This test is not yet approved or cleared by the [place]  FDA and has been authorized for detection and/or diagnosis of SARS-CoV-2 by FDA under an Emergency Use Authorization (EUA). This EUA will remain in effect (meaning this test can be used) for the duration of the COVID-19 declaration under Section 564(b)(1) of the Act, 21 U.S.C. section 360bbb-3(b)(1), unless the authorization is terminated or revoked.  Performed at [facility], [address], [place], [place] [address]   Culture, blood (routine x 2)     Status: None (Preliminary result)   Collection Time: 08/14/23 10:00 PM   Specimen: BLOOD  Result Value  Ref Range Status   Specimen Description BLOOD LEFT ARM  Final   Special Requests   Final    BOTTLES DRAWN AEROBIC AND ANAEROBIC Blood Culture results may not be optimal due to an inadequate volume of blood received in culture bottles   Culture   Final    NO GROWTH 4 DAYS Performed at [facility], [address], [place], [place] [address]    Report Status PENDING  Incomplete  Culture, blood (routine x 2)     Status: None (Preliminary result)   Collection Time: 08/14/23 10:05 PM   Specimen: BLOOD RIGHT HAND  Result Value Ref Range Status   Specimen Description BLOOD RIGHT HAND  Final   Special Requests   Final    BOTTLES DRAWN AEROBIC AND ANAEROBIC Blood Culture results may not be optimal due to an inadequate volume of blood received in culture bottles   Culture   Final    NO GROWTH 4 DAYS Performed at [facility], [address] [place], [place] [address]    Report Status PENDING  Incomplete    I have personally spent 26 minutes involved in face-to-face and non-face-to-face activities for this patient on the day of the visit. Professional time spent includes the following activities:  Preparing to see the patient (review of tests), performing a medically appropriate examination, Ordering medications/tests/procedures, communicating with other health care professionals, documenting clinical information in the EMR, Independently interpreting results, Communicating results and counseling patient on plan of care, and care coordination.   Greg Lameisha Schuenemann, NP [facility]  08/18/2023  2:09 PM

## 2023-08-18 NOTE — Progress Notes (Signed)
   08/18/23 1618  TOC Brief Assessment  Insurance and Status Reviewed  Patient has primary care physician Yes [name], [name], [place])  Home environment has been reviewed From Home  Prior level of function: Independent  Prior/Current Home Services No current home services  Social Drivers of Health Review SDOH reviewed no interventions necessary  Readmission risk has been reviewed Yes (21%)  Transition of care needs no transition of care needs at this time     08/18/23 1618  TOC Brief Assessment  Insurance and Status Reviewed  Patient has primary care physician Yes [name], [name], [place])  Home environment has been reviewed From Home  Prior level of function: Independent  Prior/Current Home Services No current home services  Social Drivers of Health Review SDOH reviewed no interventions necessary  Readmission risk has been reviewed Yes (21%)  Transition of care needs no transition of care needs at this time   Please place consult if TOC needs arise

## 2023-08-18 NOTE — Plan of Care (Signed)

## 2023-08-18 NOTE — Progress Notes (Signed)
 PROGRESS NOTE                                                                                                                                                                                                             Patient Demographics:    [name], is a 53 y.o. male, DOB - 01/26/1971, VHQ:[id number]  Outpatient Primary MD for the patient is Alexander-Savino, [name], MD    LOS - 3  Admit date - 08/14/2023    Chief Complaint  Patient presents with   Shortness of Breath       Brief Narrative (HPI from H&P)   53 y.o. male with medical history significant of bronchiectasis, COPD on home O2, HIV, type 2 diabetes mellitus, CHF, colon cancer status post resection in 2020, history of pulm embolism.  He presented to the emergency room with complaints of worsening productive cough and shortness of breath over the past 4 days, diagnosed with acute on chronic hypoxic respiratory failure due to pneumonia and admitted to the hospital   Subjective:   Patient in bed, appears comfortable, denies any headache, no fever, no chest pain or pressure, no shortness of breath , no abdominal pain. No new focal weakness.   Assessment  & Plan :    Chronic hypoxic respiratory failure in a patient with underlying COPD, bronchiectasis and HIV due to CAP (community acquired pneumonia)  Has been placed on appropriate empiric antibiotics, encouraged to sit in chair use I-S and flutter valve for pulmonary toiletry, supplemental oxygen, of note he uses 2 to 3 L of home oxygen, currently on 3, follow cultures, advance activity and titrate down oxygen.  SpO2: 100 % O2 Flow Rate (L/min): 3 L/min  Per ID    HIV = continue on tivicay  and symtuza    Pneumonia = continue on ceftriaxone  plus bactrim    Thrush = continue on fluconazole    COPD (chronic obstructive pulmonary disease) with emphysema ([name]) and underlying bronchiectasis. Advanced  underlying disease, on 2 to 3 L nasal cannula oxygen at home, currently see above, continue supplemental oxygen and home nebulizer treatments, has no wheezing will discontinue systemic steroids and monitor.  Chronic diastolic CHF.  Last EF is 50% on echocardiogram done in 2023. Clear euvolemic.  Due to recent AKI will place him on Imdur and hydralazine combination for blood pressure control and CHF.  AKI CKD stage 3a, GFR 45-59 ml/min ([name]) Renal function improved, hold ARB, monitor.  Baseline creatinine close to 1.5.  Elevated D-dimer.  Likely due to #1 above, CTA negative, lower extremity venous duplex pending.  HIV disease.  Question compliance, in the past had been in poor control, continue antiretroviral therapy along with Bactrim  for PJP prophylaxis, low CD4 count of 58/14%, HIV viral load < 20 copies/ml, ID on board, on Tivicay  and Symtuza  .    Type 2 diabetes mellitus with hyperlipidemia ([name]) Poor outpatient control as suggested by elevated A1c, had DKA upon admission, due to steroids sugar running high, insulin  adjusted on 08/18/23, steroids are now off, monitor closely.  DM and insulin  education.  Lab Results  Component Value Date   HGBA1C 10.8 (H) 08/15/2023   CBG (last 3)  Recent Labs    08/17/23 2117 08/17/23 2327 08/18/23 0330  GLUCAP 183* 347* 384*          Condition - Extremely Guarded  Family Communication  :  None  Code Status :  Full  Consults  :  ID  PUD Prophylaxis :     Procedures  :     CTA -   1. No pulmonary emboli or acute abnormality. 2. Stable changes of COPD with centrilobular and paraseptal emphysema. 3. Stable areas of bronchiectasis in the left lung with improved mucous plugging and wall thickening. 4. Improved area of residual consolidation in the inferior right lower lobe with a small amount of residual atelectasis or scarring. 5. Stable mildly enlarged bilateral hilar lymph nodes, most likely reactive. 6. Small hiatal hernia.       Disposition Plan  :    Status is: Inpatient   DVT Prophylaxis  :    enoxaparin  (LOVENOX ) injection 40 mg Start: 08/15/23 1000 SCDs Start: 08/15/23 0218      Lab Results  Component Value Date   PLT 221 08/18/2023    Diet :  Diet Order             Diet heart healthy/carb modified Fluid consistency: Thin; Fluid restriction: 1500 mL Fluid  Diet effective now                    Inpatient Medications  Scheduled Meds:  budesonide -glycopyrrolate -formoterol   1 puff Inhalation BID   Darunavir -Cobicistat -Emtricitabine -Tenofovir  Alafenamide  1 tablet Oral Q breakfast   docusate sodium   100 mg Oral BID   dolutegravir   50 mg Oral Daily   enoxaparin  (LOVENOX ) injection  40 mg Subcutaneous Daily   feeding supplement  237 mL Oral BID BM   fluconazole   200 mg Oral Daily   guaiFENesin   600 mg Oral BID   hydrALAZINE  50 mg Oral Q8H   insulin  aspart  0-20 Units Subcutaneous TID WC   insulin  aspart  0-5 Units Subcutaneous QHS   insulin  aspart  5 Units Subcutaneous TID WC   insulin  glargine-yfgn  35 Units Subcutaneous BID   ipratropium-albuterol   3 mL Nebulization BID   isosorbide mononitrate  15 mg Oral Daily   predniSONE   40 mg Oral BID WC   Followed by   Cecily Cohen ON 08/21/2023] predniSONE   40 mg Oral Q breakfast   Followed by   Cecily Cohen ON 08/26/2023] predniSONE   20 mg Oral Q breakfast   rosuvastatin   20 mg Oral Daily   Continuous Infusions:  azithromycin  500 mg (08/18/23 0035)   cefTRIAXone  (ROCEPHIN )  IV 1 g (08/17/23 2256)   sulfamethoxazole -trimethoprim  320 mg of trimethoprim  (08/18/23 0140)  PRN Meds:.acetaminophen  **OR** acetaminophen , albuterol , benzonatate , ondansetron  **OR** ondansetron  (ZOFRAN ) IV    Objective:   Vitals:   08/18/23 0000 08/18/23 0040 08/18/23 0400 08/18/23 0707  BP: 130/89  130/89 (!) 155/100  Pulse: 89 88 75   Resp: 20 (!) 21 16   Temp: 98.1 F (36.7 C)  98 F (36.7 C)   TempSrc: Oral  Oral   SpO2: 100% 94% 100%   Weight:      Height:         Wt Readings from Last 3 Encounters:  08/14/23 84 kg  06/09/23 84.1 kg  06/04/23 83 kg     Intake/Output Summary (Last 24 hours) at 08/18/2023 0743 Last data filed at 08/18/2023 0500 Gross per 24 hour  Intake 240 ml  Output 2950 ml  Net -2710 ml     Physical Exam  Awake Alert, No new F.N deficits, Normal affect [place].AT,PERRAL Supple Neck, No JVD,   Symmetrical Chest wall movement, Good air movement bilaterally, CTAB RRR,No Gallops,Rubs or new Murmurs,  +ve B.Sounds, Abd Soft, No tenderness,   No Cyanosis, Clubbing or edema     Data Review:    Recent Labs  Lab 08/14/23 2144 08/15/23 0010 08/15/23 0358 08/16/23 0430 08/17/23 0431 08/18/23 0435  WBC 9.1  --  7.8 16.3* 9.3 6.9  HGB 14.7 15.0 13.8 12.5* 12.5* 13.3  HCT 43.6 44.0 41.6 37.3* 37.6* 39.7  PLT 211  --  203 214 209 221  MCV 94.2  --  97.2 96.1 96.4 94.1  MCH 31.7  --  32.2 32.2 32.1 31.5  MCHC 33.7  --  33.2 33.5 33.2 33.5  RDW 11.3*  --  11.4* 11.4* 11.5 11.5  LYMPHSABS 1.7  --   --   --  0.5* 0.6*  MONOABS 0.5  --   --   --  0.4 0.5  EOSABS 0.0  --   --   --  0.0 0.0  BASOSABS 0.0  --   --   --  0.0 0.0    Recent Labs  Lab 08/14/23 2144 08/14/23 2158 08/14/23 2159 08/14/23 2217 08/15/23 0010 08/15/23 0358 08/15/23 1437 08/16/23 0430 08/16/23 0855 08/17/23 0431 08/18/23 0435  NA 130*  --   --   --    < > 134* 134* 133*  --  133* 130*  K 4.4  --   --   --    < > 5.1 4.7 4.4  --  4.5 4.7  CL 89*  --   --   --   --  96* 97* 99  --  102 95*  CO2 25  --   --   --   --  22 26 26   --  27 27  ANIONGAP 16*  --   --   --   --  16* 11 8  --  4* 8  GLUCOSE 599*  --   --   --   --  578* 329* 287*  --  254* 345*  BUN 30*  --   --   --   --  32* 28* 32*  --  25* 28*  CREATININE 1.59*  --   --   --   --  1.63* 1.27* 1.30*  --  1.09 1.17  AST 15  --   --   --   --   --   --   --   --   --   --   ALT 18  --   --   --   --   --   --   --   --   --   --  ALKPHOS 84  --   --   --   --   --   --   --   --    --   --   BILITOT 1.0  --   --   --   --   --   --   --   --   --   --   ALBUMIN 3.0*  --   --   --   --   --   --   --   --   --   --   CRP  --   --   --   --   --   --   --   --  9.1* 4.9* 2.5*  DDIMER  --   --  0.59*  --   --   --   --   --   --   --   --   PROCALCITON  --   --   --   --   --   --   --   --  <0.10 0.11 <0.10  LATICACIDVEN  --   --   --  1.4  --   --   --   --   --   --   --   HGBA1C  --   --   --   --   --  10.8*  --   --   --   --   --   BNP  --  12.9  --   --   --   --   --   --   --  157.6* 76.9  MG  --   --   --   --   --   --   --   --   --  2.0 1.9  CALCIUM  10.0  --   --   --   --  9.2 9.1 8.9  --  8.8* 9.3   < > = values in this interval not displayed.      Recent Labs  Lab 08/14/23 2158 08/14/23 2159 08/14/23 2217 08/15/23 0358 08/15/23 1437 08/16/23 0430 08/16/23 0855 08/17/23 0431 08/18/23 0435  CRP  --   --   --   --   --   --  9.1* 4.9* 2.5*  DDIMER  --  0.59*  --   --   --   --   --   --   --   PROCALCITON  --   --   --   --   --   --  <0.10 0.11 <0.10  LATICACIDVEN  --   --  1.4  --   --   --   --   --   --   HGBA1C  --   --   --  10.8*  --   --   --   --   --   BNP 12.9  --   --   --   --   --   --  157.6* 76.9  MG  --   --   --   --   --   --   --  2.0 1.9  CALCIUM   --   --   --  9.2 9.1 8.9  --  8.8* 9.3    --------------------------------------------------------------------------------------------------------------- Lab Results  Component Value Date   CHOL 194 03/17/2022   HDL 52 03/17/2022   LDLCALC 119 (H) 03/17/2022   TRIG  115 03/17/2022   CHOLHDL 3.7 03/17/2022    Lab Results  Component Value Date   HGBA1C 10.8 (H) 08/15/2023   No results for input(s): "TSH", "T4TOTAL", "FREET4", "T3FREE", "THYROIDAB" in the last 72 hours. No results for input(s): "VITAMINB12", "FOLATE", "FERRITIN", "TIBC", "IRON", "RETICCTPCT" in the last 72  hours. ------------------------------------------------------------------------------------------------------------------ Cardiac Enzymes No results for input(s): "CKMB", "TROPONINI", "MYOGLOBIN" in the last 168 hours.  Invalid input(s): "CK"  Micro Results Recent Results (from the past 240 hours)  Resp panel by RT-PCR (RSV, Flu A&B, Covid) Anterior Nasal Swab     Status: None   Collection Time: 08/14/23  9:44 PM   Specimen: Anterior Nasal Swab  Result Value Ref Range Status   SARS Coronavirus 2 by RT PCR NEGATIVE NEGATIVE Final   Influenza A by PCR NEGATIVE NEGATIVE Final   Influenza B by PCR NEGATIVE NEGATIVE Final    Comment: (NOTE) The Xpert Xpress SARS-CoV-2/FLU/RSV plus assay is intended as an aid in the diagnosis of influenza from Nasopharyngeal swab specimens and should not be used as a sole basis for treatment. Nasal washings and aspirates are unacceptable for Xpert Xpress SARS-CoV-2/FLU/RSV testing.  Fact Sheet for Patients: BloggerCourse.com  Fact Sheet for Healthcare Providers: SeriousBroker.it  This test is not yet approved or cleared by the [place]  FDA and has been authorized for detection and/or diagnosis of SARS-CoV-2 by FDA under an Emergency Use Authorization (EUA). This EUA will remain in effect (meaning this test can be used) for the duration of the COVID-19 declaration under Section 564(b)(1) of the Act, 21 U.S.C. section 360bbb-3(b)(1), unless the authorization is terminated or revoked.     Resp Syncytial Virus by PCR NEGATIVE NEGATIVE Final    Comment: (NOTE) Fact Sheet for Patients: BloggerCourse.com  Fact Sheet for Healthcare Providers: SeriousBroker.it  This test is not yet approved or cleared by the [place]  FDA and has been authorized for detection and/or diagnosis of SARS-CoV-2 by FDA under an Emergency Use Authorization (EUA). This  EUA will remain in effect (meaning this test can be used) for the duration of the COVID-19 declaration under Section 564(b)(1) of the Act, 21 U.S.C. section 360bbb-3(b)(1), unless the authorization is terminated or revoked.  Performed at [facility], [address] [place], [place] [address]   Culture, blood (routine x 2)     Status: None (Preliminary result)   Collection Time: 08/14/23 10:00 PM   Specimen: BLOOD  Result Value Ref Range Status   Specimen Description BLOOD LEFT ARM  Final   Special Requests   Final    BOTTLES DRAWN AEROBIC AND ANAEROBIC Blood Culture results may not be optimal due to an inadequate volume of blood received in culture bottles   Culture   Final    NO GROWTH 3 DAYS Performed at [facility], [address] [place], [place] [address]    Report Status PENDING  Incomplete  Culture, blood (routine x 2)     Status: None (Preliminary result)   Collection Time: 08/14/23 10:05 PM   Specimen: BLOOD RIGHT HAND  Result Value Ref Range Status   Specimen Description BLOOD RIGHT HAND  Final   Special Requests   Final    BOTTLES DRAWN AEROBIC AND ANAEROBIC Blood Culture results may not be optimal due to an inadequate volume of blood received in culture bottles   Culture   Final    NO GROWTH 3 DAYS Performed at [facility], [address], [place], [place] [address]  Report Status PENDING  Incomplete    Radiology Report ECHOCARDIOGRAM COMPLETE Result Date: 08/16/2023    ECHOCARDIOGRAM REPORT   Patient Name:   AWAB ABEBE Date of Exam: 08/16/2023 Medical Rec #:  [id number]          Height:       68.0 in Accession #:    [id number]         Weight:       185.2 lb Date of Birth:  Jul 17, 1970           BSA:          1.978 m Patient Age:    53 years           BP:           119/79 mmHg Patient Gender: M                  HR:           100 bpm. Exam Location:  Inpatient Procedure: 2D Echo, Cardiac Doppler and Color Doppler (Both Spectral and Color             Flow Doppler were utilized during procedure). Indications:    CHF  History:        Patient has prior history of Echocardiogram examinations, most                 recent 11/17/2021. CHF.  Sonographer:    Janette Medley Referring Phys: Theodora Fish IMPRESSIONS  1. Left ventricular ejection fraction, by estimation, is 55%. The left ventricle has normal function. The left ventricle has no regional wall motion abnormalities. Left ventricular diastolic parameters are consistent with Grade I diastolic dysfunction (impaired relaxation).  2. Mildly D-shaped interventricular septum suggests a degree of RV pressure/volume overload. Right ventricular systolic function is mildly reduced. The right ventricular size is mildly enlarged. Tricuspid regurgitation signal is inadequate for assessing  PA pressure.  3. Right atrial size was mildly dilated. Chiari network noted in RA.  4. The mitral valve is normal in structure. No evidence of mitral valve regurgitation. No evidence of mitral stenosis.  5. The aortic valve is tricuspid. Aortic valve regurgitation is not visualized. No aortic stenosis is present.  6. Aortic dilatation noted.  7. The inferior vena cava is normal in size with greater than 50% respiratory variability, suggesting right atrial pressure of 3 mmHg. FINDINGS  Left Ventricle: Left ventricular ejection fraction, by estimation, is 55%. The left ventricle has normal function. The left ventricle has no regional wall motion abnormalities. The left ventricular internal cavity size was normal in size. There is no left ventricular hypertrophy. Left ventricular diastolic parameters are consistent with Grade I diastolic dysfunction (impaired relaxation). Right Ventricle: Mildly D-shaped interventricular septum suggests a degree of RV pressure/volume overload. The right ventricular size is mildly enlarged. No increase in right ventricular wall thickness. Right ventricular systolic function is mildly reduced. Tricuspid  regurgitation signal is inadequate for assessing PA pressure. Left Atrium: Left atrial size was normal in size. Right Atrium: Right atrial size was mildly dilated. Pericardium: There is no evidence of pericardial effusion. Mitral Valve: The mitral valve is normal in structure. No evidence of mitral valve regurgitation. No evidence of mitral valve stenosis. Tricuspid Valve: The tricuspid valve is normal in structure. Tricuspid valve regurgitation is not demonstrated. Aortic Valve: The aortic valve is tricuspid. Aortic valve regurgitation is not visualized. No aortic stenosis is present. Pulmonic Valve: The pulmonic valve was normal in structure. Pulmonic  valve regurgitation is not visualized. Aorta: Aortic dilatation noted. Venous: The inferior vena cava is normal in size with greater than 50% respiratory variability, suggesting right atrial pressure of 3 mmHg. IAS/Shunts: No atrial level shunt detected by color flow Doppler.  LEFT VENTRICLE PLAX 2D LVIDd:         5.00 cm   Diastology LVIDs:         3.20 cm   LV e' medial:    11.10 cm/s LV PW:         0.90 cm   LV E/e' medial:  7.9 LV IVS:        0.90 cm   LV e' lateral:   18.40 cm/s LVOT diam:     2.10 cm   LV E/e' lateral: 4.8 LV SV:         77 LV SV Index:   39 LVOT Area:     3.46 cm  RIGHT VENTRICLE             IVC RV S prime:     18.90 cm/s  IVC diam: 1.70 cm TAPSE (M-mode): 2.2 cm LEFT ATRIUM             Index        RIGHT ATRIUM           Index LA diam:        3.20 cm 1.62 cm/m   RA Area:     18.20 cm LA Vol (A2C):   28.8 ml 14.56 ml/m  RA Volume:   49.40 ml  24.97 ml/m LA Vol (A4C):   34.0 ml 17.19 ml/m LA Biplane Vol: 34.2 ml 17.29 ml/m  AORTIC VALVE LVOT Vmax:   133.00 cm/s LVOT Vmean:  93.300 cm/s LVOT VTI:    0.222 m  AORTA Ao Root diam: 2.80 cm Ao Asc diam:  3.20 cm MITRAL VALVE MV Area (PHT): 4.49 cm     SHUNTS MV Decel Time: 169 msec     Systemic VTI:  0.22 m MV E velocity: 87.50 cm/s   Systemic Diam: 2.10 cm MV A velocity: 114.00 cm/s MV E/A  ratio:  0.77 Dalton McleanMD Electronically signed by Archer Bear Signature Date/Time: 08/16/2023/2:24:11 PM    Final    CT Angio Chest Pulmonary Embolism (PE) W or WO Contrast Result Date: 08/16/2023 CLINICAL DATA:  Suspected pulmonary embolism EXAM: CT ANGIOGRAPHY CHEST WITH CONTRAST TECHNIQUE: Multidetector CT imaging of the chest was performed using the standard protocol during bolus administration of intravenous contrast. Multiplanar CT image reconstructions and MIPs were obtained to evaluate the vascular anatomy. RADIATION DOSE REDUCTION: This exam was performed according to the departmental dose-optimization program which includes automated exposure control, adjustment of the mA and/or kV according to patient size and/or use of iterative reconstruction technique. CONTRAST:  75mL OMNIPAQUE  IOHEXOL  350 MG/ML SOLN COMPARISON:  CT angio August 19, 2022 FINDINGS: Cardiovascular: Satisfactory opacification of the pulmonary arteries to the segmental level. No evidence of pulmonary embolism. Normal heart size. No pericardial effusion. Mediastinum/Nodes: No enlarged mediastinal, hilar, or axillary lymph nodes. Thyroid gland, trachea, and esophagus demonstrate no significant findings. Lungs/Pleura: Comparison with prior examination accounting for differences in techniques no significant change in the chronic interstitial lung disease, with fibrotic changes particularly involving the left lung, fibro atelectatic changes of both upper lobes as well as bronchiectatic changes of the bases of the lungs. There is however, some consolidation within the right lower lobe which correlates with a superimposed pneumonia. There is no suspicious pulmonary nodules.  Upper Abdomen: No acute abnormality. Musculoskeletal: No chest wall abnormality. No acute or significant osseous findings. Review of the MIP images confirms the above findings. IMPRESSION: *No evidence of pulmonary embolism. *Chronic interstitial lung disease with  fibrotic changes particularly involving the left lung, fibro atelectatic changes of both upper lobes as well as bronchiectatic changes of the bases of the lungs. *There is however, some consolidation within the right lower lobe which correlates with a superimposed pneumonia. Electronically Signed   By: Fredrich Jefferson M.D.   On: 08/16/2023 08:40     Signature  -   Lynnwood Sauer M.D on 08/18/2023 at 7:43 AM   -  To page go to [email or web address]

## 2023-08-19 ENCOUNTER — Telehealth (HOSPITAL_COMMUNITY): Payer: Self-pay | Admitting: Pharmacy Technician

## 2023-08-19 ENCOUNTER — Encounter (HOSPITAL_COMMUNITY)

## 2023-08-19 ENCOUNTER — Other Ambulatory Visit (HOSPITAL_COMMUNITY): Payer: Self-pay

## 2023-08-19 DIAGNOSIS — B20 Human immunodeficiency virus [HIV] disease: Secondary | ICD-10-CM | POA: Diagnosis not present

## 2023-08-19 DIAGNOSIS — J189 Pneumonia, unspecified organism: Secondary | ICD-10-CM | POA: Diagnosis not present

## 2023-08-19 LAB — CBC WITH DIFFERENTIAL/PLATELET
Abs Immature Granulocytes: 0.03 10*3/uL (ref 0.00–0.07)
Basophils Absolute: 0 10*3/uL (ref 0.0–0.1)
Basophils Relative: 0 %
Eosinophils Absolute: 0 10*3/uL (ref 0.0–0.5)
Eosinophils Relative: 0 %
HCT: 42.8 % (ref 39.0–52.0)
Hemoglobin: 14.5 g/dL (ref 13.0–17.0)
Immature Granulocytes: 1 %
Lymphocytes Relative: 10 %
Lymphs Abs: 0.6 10*3/uL — ABNORMAL LOW (ref 0.7–4.0)
MCH: 32.6 pg (ref 26.0–34.0)
MCHC: 33.9 g/dL (ref 30.0–36.0)
MCV: 96.2 fL (ref 80.0–100.0)
Monocytes Absolute: 0.5 10*3/uL (ref 0.1–1.0)
Monocytes Relative: 7 %
Neutro Abs: 5.3 10*3/uL (ref 1.7–7.7)
Neutrophils Relative %: 82 %
Platelets: 243 10*3/uL (ref 150–400)
RBC: 4.45 MIL/uL (ref 4.22–5.81)
RDW: 11.6 % (ref 11.5–15.5)
WBC: 6.4 10*3/uL (ref 4.0–10.5)
nRBC: 0 % (ref 0.0–0.2)

## 2023-08-19 LAB — CULTURE, BLOOD (ROUTINE X 2)
Culture: NO GROWTH
Culture: NO GROWTH

## 2023-08-19 LAB — BASIC METABOLIC PANEL WITH GFR
Anion gap: 9 (ref 5–15)
BUN: 31 mg/dL — ABNORMAL HIGH (ref 6–20)
CO2: 27 mmol/L (ref 22–32)
Calcium: 9.8 mg/dL (ref 8.9–10.3)
Chloride: 97 mmol/L — ABNORMAL LOW (ref 98–111)
Creatinine, Ser: 1.48 mg/dL — ABNORMAL HIGH (ref 0.61–1.24)
GFR, Estimated: 57 mL/min — ABNORMAL LOW (ref >60)
Glucose, Bld: 196 mg/dL — ABNORMAL HIGH (ref 70–99)
Potassium: 4.9 mmol/L (ref 3.5–5.1)
Sodium: 133 mmol/L — ABNORMAL LOW (ref 135–145)

## 2023-08-19 LAB — GLUCOSE, CAPILLARY
Glucose-Capillary: 122 mg/dL — ABNORMAL HIGH (ref 70–99)
Glucose-Capillary: 149 mg/dL — ABNORMAL HIGH (ref 70–99)
Glucose-Capillary: 179 mg/dL — ABNORMAL HIGH (ref 70–99)
Glucose-Capillary: 206 mg/dL — ABNORMAL HIGH (ref 70–99)
Glucose-Capillary: 223 mg/dL — ABNORMAL HIGH (ref 70–99)
Glucose-Capillary: 232 mg/dL — ABNORMAL HIGH (ref 70–99)
Glucose-Capillary: 235 mg/dL — ABNORMAL HIGH (ref 70–99)
Glucose-Capillary: 333 mg/dL — ABNORMAL HIGH (ref 70–99)

## 2023-08-19 LAB — C-REACTIVE PROTEIN: CRP: 1.3 mg/dL — ABNORMAL HIGH (ref <1.0)

## 2023-08-19 LAB — PROCALCITONIN: Procalcitonin: <0.1 ng/mL

## 2023-08-19 LAB — BRAIN NATRIURETIC PEPTIDE: B Natriuretic Peptide: 28.3 pg/mL (ref 0.0–100.0)

## 2023-08-19 LAB — MAGNESIUM: Magnesium: 2.1 mg/dL (ref 1.7–2.4)

## 2023-08-19 MED ORDER — INSULIN ASPART 100 UNIT/ML IJ SOLN
0.0000 [IU] | Freq: Every day | INTRAMUSCULAR | Status: DC
  Administered 2023-08-20: 2 [IU] via SUBCUTANEOUS

## 2023-08-19 MED ORDER — INSULIN ASPART 100 UNIT/ML IJ SOLN
0.0000 [IU] | INTRAMUSCULAR | Status: DC
  Administered 2023-08-19: 5 [IU] via SUBCUTANEOUS

## 2023-08-19 MED ORDER — INSULIN ASPART 100 UNIT/ML IJ SOLN
0.0000 [IU] | Freq: Three times a day (TID) | INTRAMUSCULAR | Status: DC
  Administered 2023-08-19: 11 [IU] via SUBCUTANEOUS
  Administered 2023-08-19: 5 [IU] via SUBCUTANEOUS
  Administered 2023-08-20: 3 [IU] via SUBCUTANEOUS
  Administered 2023-08-20: 5 [IU] via SUBCUTANEOUS

## 2023-08-19 NOTE — Plan of Care (Signed)

## 2023-08-19 NOTE — Progress Notes (Signed)
 PROGRESS NOTE                                                                                                                                                                                                             Patient Demographics:    [name], is a 53 y.o. male, DOB - 1970/08/31, EAV:[id number]  Outpatient Primary MD for the patient is Alexander-Savino, [name], MD    LOS - 4  Admit date - 08/14/2023    Chief Complaint  Patient presents with   Shortness of Breath       Brief Narrative (HPI from H&P)   53 y.o. male with medical history significant of bronchiectasis, COPD on home O2, HIV, type 2 diabetes mellitus, CHF, colon cancer status post resection in 2020, history of pulm embolism.  He presented to the emergency room with complaints of worsening productive cough and shortness of breath over the past 4 days, diagnosed with acute on chronic hypoxic respiratory failure due to pneumonia and admitted to the hospital   Subjective:   Patient in bed, appears comfortable, denies any headache, no fever, no chest pain or pressure, no shortness of breath , no abdominal pain. No new focal weakness.    Assessment  & Plan :    Chronic hypoxic respiratory failure in a patient with underlying COPD, bronchiectasis and HIV due to CAP (community acquired pneumonia)  Has been placed on appropriate empiric antibiotics, encouraged to sit in chair use I-S and flutter valve for pulmonary toiletry, supplemental oxygen, of note he uses 2 to 3 L of home oxygen, currently on 3, follow cultures, advance activity and titrate down oxygen.  SpO2: 100 % O2 Flow Rate (L/min): 1 L/min  Per ID    HIV = continue on tivicay  and symtuza    Pneumonia = continue on ceftriaxone  plus bactrim    Thrush = continue on fluconazole    COPD (chronic obstructive pulmonary disease) with emphysema ([name]) and underlying bronchiectasis. Advanced  underlying disease, on 2 to 3 L nasal cannula oxygen at home, currently see above, continue supplemental oxygen and home nebulizer treatments, has no wheezing will discontinue systemic steroids and monitor.  Chronic diastolic CHF.  Last EF is 50% on echocardiogram done in 2023. Clear euvolemic.  Due to recent AKI will place him on Imdur and hydralazine combination for blood pressure control and CHF.  AKI CKD stage 3a, GFR 45-59 ml/min ([name]) Renal function improved, hold ARB, monitor.  Baseline creatinine close to 1.5.  Elevated D-dimer.  Likely due to #1 above, CTA negative, lower extremity venous duplex pending.  HIV disease.  Question compliance, in the past had been in poor control, continue antiretroviral therapy along with Bactrim  for PJP prophylaxis, low CD4 count of 58/14%, HIV viral load < 20 copies/ml, ID on board, on Tivicay  and Symtuza  .    Type 2 diabetes mellitus with hyperlipidemia ([name]) Poor outpatient control as suggested by elevated A1c, had DKA upon admission, due to steroids sugar running high, insulin  adjusted on 08/18/23, steroids are now off, monitor closely.  DM and insulin  education.  Lab Results  Component Value Date   HGBA1C 10.8 (H) 08/15/2023   CBG (last 3)  Recent Labs    08/19/23 0331 08/19/23 0616 08/19/23 0833  GLUCAP 235* 149* 223*          Condition - Extremely Guarded  Family Communication  :  None  Code Status :  Full  Consults  :  ID  PUD Prophylaxis :     Procedures  :     CTA -   1. No pulmonary emboli or acute abnormality. 2. Stable changes of COPD with centrilobular and paraseptal emphysema. 3. Stable areas of bronchiectasis in the left lung with improved mucous plugging and wall thickening. 4. Improved area of residual consolidation in the inferior right lower lobe with a small amount of residual atelectasis or scarring. 5. Stable mildly enlarged bilateral hilar lymph nodes, most likely reactive. 6. Small hiatal hernia.       Disposition Plan  :    Status is: Inpatient   DVT Prophylaxis  :    enoxaparin  (LOVENOX ) injection 40 mg Start: 08/15/23 1000 SCDs Start: 08/15/23 0218      Lab Results  Component Value Date   PLT 243 08/19/2023    Diet :  Diet Order             Diet heart healthy/carb modified Fluid consistency: Thin; Fluid restriction: 1500 mL Fluid  Diet effective now                    Inpatient Medications  Scheduled Meds:  budesonide -glycopyrrolate -formoterol   1 puff Inhalation BID   Darunavir -Cobicistat -Emtricitabine -Tenofovir  Alafenamide  1 tablet Oral Q breakfast   docusate sodium   100 mg Oral BID   dolutegravir   50 mg Oral Daily   enoxaparin  (LOVENOX ) injection  40 mg Subcutaneous Daily   feeding supplement  237 mL Oral BID BM   fluconazole   200 mg Oral Daily   guaiFENesin   600 mg Oral BID   hydrALAZINE  50 mg Oral Q8H   insulin  aspart  0-15 Units Subcutaneous Q4H   insulin  glargine-yfgn  35 Units Subcutaneous BID   ipratropium-albuterol   3 mL Nebulization BID   isosorbide mononitrate  15 mg Oral Daily   rosuvastatin   20 mg Oral Daily   sulfamethoxazole -trimethoprim   1 tablet Oral Daily   Continuous Infusions:  azithromycin  500 mg (08/18/23 2202)   cefTRIAXone  (ROCEPHIN )  IV 1 g (08/18/23 2158)   PRN Meds:.acetaminophen  **OR** acetaminophen , albuterol , benzonatate , ondansetron  **OR** ondansetron  (ZOFRAN ) IV    Objective:   Vitals:   08/18/23 2000 08/19/23 0000 08/19/23 0400 08/19/23 0800  BP:    121/73  Pulse:    (!) 106  Resp:    16  Temp: 97.9 F (36.6 C) 98.4 F (36.9 C) 98 F (36.7  C) 98.5 F (36.9 C)  TempSrc: Oral Oral Oral Oral  SpO2:    100%  Weight:      Height:        Wt Readings from Last 3 Encounters:  08/14/23 84 kg  06/09/23 84.1 kg  06/04/23 83 kg     Intake/Output Summary (Last 24 hours) at 08/19/2023 0844 Last data filed at 08/19/2023 0600 Gross per 24 hour  Intake --  Output 1500 ml  Net -1500 ml     Physical  Exam  Awake Alert, No new F.N deficits, Normal affect [place].AT,PERRAL Supple Neck, No JVD,   Symmetrical Chest wall movement, Good air movement bilaterally, CTAB RRR,No Gallops,Rubs or new Murmurs,  +ve B.Sounds, Abd Soft, No tenderness,   No Cyanosis, Clubbing or edema     Data Review:    Recent Labs  Lab 08/14/23 2144 08/15/23 0010 08/15/23 0358 08/16/23 0430 08/17/23 0431 08/18/23 0435 08/19/23 0436  WBC 9.1  --  7.8 16.3* 9.3 6.9 6.4  HGB 14.7   < > 13.8 12.5* 12.5* 13.3 14.5  HCT 43.6   < > 41.6 37.3* 37.6* 39.7 42.8  PLT 211  --  203 214 209 221 243  MCV 94.2  --  97.2 96.1 96.4 94.1 96.2  MCH 31.7  --  32.2 32.2 32.1 31.5 32.6  MCHC 33.7  --  33.2 33.5 33.2 33.5 33.9  RDW 11.3*  --  11.4* 11.4* 11.5 11.5 11.6  LYMPHSABS 1.7  --   --   --  0.5* 0.6* 0.6*  MONOABS 0.5  --   --   --  0.4 0.5 0.5  EOSABS 0.0  --   --   --  0.0 0.0 0.0  BASOSABS 0.0  --   --   --  0.0 0.0 0.0   < > = values in this interval not displayed.    Recent Labs  Lab 08/14/23 2144 08/14/23 2158 08/14/23 2159 08/14/23 2217 08/15/23 0010 08/15/23 0358 08/15/23 1437 08/16/23 0430 08/16/23 0855 08/17/23 0431 08/18/23 0435 08/19/23 0436  NA 130*  --   --   --    < > 134* 134* 133*  --  133* 130* 133*  K 4.4  --   --   --    < > 5.1 4.7 4.4  --  4.5 4.7 4.9  CL 89*  --   --   --   --  96* 97* 99  --  102 95* 97*  CO2 25  --   --   --   --  22 26 26   --  27 27 27   ANIONGAP 16*  --   --   --   --  16* 11 8  --  4* 8 9  GLUCOSE 599*  --   --   --   --  578* 329* 287*  --  254* 345* 196*  BUN 30*  --   --   --   --  32* 28* 32*  --  25* 28* 31*  CREATININE 1.59*  --   --   --   --  1.63* 1.27* 1.30*  --  1.09 1.17 1.48*  AST 15  --   --   --   --   --   --   --   --   --   --   --   ALT 18  --   --   --   --   --   --   --   --   --   --   --  ALKPHOS 84  --   --   --   --   --   --   --   --   --   --   --   BILITOT 1.0  --   --   --   --   --   --   --   --   --   --   --   ALBUMIN 3.0*   --   --   --   --   --   --   --   --   --   --   --   CRP  --   --   --   --   --   --   --   --  9.1* 4.9* 2.5* 1.3*  DDIMER  --   --  0.59*  --   --   --   --   --   --   --   --   --   PROCALCITON  --   --   --   --   --   --   --   --  <0.10 0.11 <0.10 <0.10  LATICACIDVEN  --   --   --  1.4  --   --   --   --   --   --   --   --   HGBA1C  --   --   --   --   --  10.8*  --   --   --   --   --   --   BNP  --  12.9  --   --   --   --   --   --   --  157.6* 76.9 28.3  MG  --   --   --   --   --   --   --   --   --  2.0 1.9 2.1  CALCIUM  10.0  --   --   --   --  9.2 9.1 8.9  --  8.8* 9.3 9.8   < > = values in this interval not displayed.      Recent Labs  Lab 08/14/23 2158 08/14/23 2159 08/14/23 2217 08/15/23 0358 08/15/23 1437 08/16/23 0430 08/16/23 0855 08/17/23 0431 08/18/23 0435 08/19/23 0436  CRP  --   --   --   --   --   --  9.1* 4.9* 2.5* 1.3*  DDIMER  --  0.59*  --   --   --   --   --   --   --   --   PROCALCITON  --   --   --   --   --   --  <0.10 0.11 <0.10 <0.10  LATICACIDVEN  --   --  1.4  --   --   --   --   --   --   --   HGBA1C  --   --   --  10.8*  --   --   --   --   --   --   BNP 12.9  --   --   --   --   --   --  157.6* 76.9 28.3  MG  --   --   --   --   --   --   --  2.0 1.9 2.1  CALCIUM   --   --   --  9.2 9.1 8.9  --  8.8* 9.3 9.8    --------------------------------------------------------------------------------------------------------------- Lab Results  Component Value Date   CHOL 194 03/17/2022   HDL 52 03/17/2022   LDLCALC 119 (H) 03/17/2022   TRIG 115 03/17/2022   CHOLHDL 3.7 03/17/2022    Lab Results  Component Value Date   HGBA1C 10.8 (H) 08/15/2023   No results for input(s): "TSH", "T4TOTAL", "FREET4", "T3FREE", "THYROIDAB" in the last 72 hours. No results for input(s): "VITAMINB12", "FOLATE", "FERRITIN", "TIBC", "IRON", "RETICCTPCT" in the last 72  hours. ------------------------------------------------------------------------------------------------------------------ Cardiac Enzymes No results for input(s): "CKMB", "TROPONINI", "MYOGLOBIN" in the last 168 hours.  Invalid input(s): "CK"  Micro Results Recent Results (from the past 240 hours)  Resp panel by RT-PCR (RSV, Flu A&B, Covid) Anterior Nasal Swab     Status: None   Collection Time: 08/14/23  9:44 PM   Specimen: Anterior Nasal Swab  Result Value Ref Range Status   SARS Coronavirus 2 by RT PCR NEGATIVE NEGATIVE Final   Influenza A by PCR NEGATIVE NEGATIVE Final   Influenza B by PCR NEGATIVE NEGATIVE Final    Comment: (NOTE) The Xpert Xpress SARS-CoV-2/FLU/RSV plus assay is intended as an aid in the diagnosis of influenza from Nasopharyngeal swab specimens and should not be used as a sole basis for treatment. Nasal washings and aspirates are unacceptable for Xpert Xpress SARS-CoV-2/FLU/RSV testing.  Fact Sheet for Patients: BloggerCourse.com  Fact Sheet for Healthcare Providers: SeriousBroker.it  This test is not yet approved or cleared by the [place]  FDA and has been authorized for detection and/or diagnosis of SARS-CoV-2 by FDA under an Emergency Use Authorization (EUA). This EUA will remain in effect (meaning this test can be used) for the duration of the COVID-19 declaration under Section 564(b)(1) of the Act, 21 U.S.C. section 360bbb-3(b)(1), unless the authorization is terminated or revoked.     Resp Syncytial Virus by PCR NEGATIVE NEGATIVE Final    Comment: (NOTE) Fact Sheet for Patients: BloggerCourse.com  Fact Sheet for Healthcare Providers: SeriousBroker.it  This test is not yet approved or cleared by the [place]  FDA and has been authorized for detection and/or diagnosis of SARS-CoV-2 by FDA under an Emergency Use Authorization (EUA). This  EUA will remain in effect (meaning this test can be used) for the duration of the COVID-19 declaration under Section 564(b)(1) of the Act, 21 U.S.C. section 360bbb-3(b)(1), unless the authorization is terminated or revoked.  Performed at [facility], [address], [place], [place] [address]   Culture, blood (routine x 2)     Status: None (Preliminary result)   Collection Time: 08/14/23 10:00 PM   Specimen: BLOOD  Result Value Ref Range Status   Specimen Description BLOOD LEFT ARM  Final   Special Requests   Final    BOTTLES DRAWN AEROBIC AND ANAEROBIC Blood Culture results may not be optimal due to an inadequate volume of blood received in culture bottles   Culture   Final    NO GROWTH 4 DAYS Performed at [facility], [address], [place], [place] [address]    Report Status PENDING  Incomplete  Culture, blood (routine x 2)     Status: None (Preliminary result)   Collection Time: 08/14/23 10:05 PM   Specimen: BLOOD RIGHT HAND  Result Value Ref Range Status   Specimen Description BLOOD RIGHT HAND  Final   Special Requests   Final    BOTTLES DRAWN AEROBIC AND ANAEROBIC Blood Culture results may not be optimal  due to an inadequate volume of blood received in culture bottles   Culture   Final    NO GROWTH 4 DAYS Performed at [facility], [address], [place], [place] [address]    Report Status PENDING  Incomplete    Radiology Report No results found.    Signature  -   Lynnwood Sauer M.D on 08/19/2023 at 8:44 AM   -  To page go to [email or web address]

## 2023-08-19 NOTE — Telephone Encounter (Signed)
 Patient Product/process development scientist completed.    The patient is insured through [employer]. Patient has [employer], may use a copay card, and/or apply for patient assistance if available.    Ran test claim for Symtuza  and Tivicay   and he has already got it filled at retail for 2 times and must now get it filled mail order    This test claim was processed through [employer]- copay amounts may vary at other pharmacies due to [employer], or as the patient moves through the different stages of their insurance plan.     Morgan Arab, CPHT Pharmacy Technician III Certified Patient Advocate [facility] Patient Advocate Team Direct Number: [phone]  Fax: [phone]

## 2023-08-19 NOTE — Plan of Care (Signed)
  Problem: Education: Goal: Ability to describe self-care measures that may prevent or decrease complications (Diabetes Survival Skills Education) will improve Outcome: Progressing   Problem: Coping: Goal: Ability to adjust to condition or change in health will improve Outcome: Progressing   Problem: Metabolic: Goal: Ability to maintain appropriate glucose levels will improve Outcome: Progressing   Problem: Nutritional: Goal: Maintenance of adequate nutrition will improve Outcome: Progressing   Problem: Skin Integrity: Goal: Risk for impaired skin integrity will decrease Outcome: Progressing   Problem: Tissue Perfusion: Goal: Adequacy of tissue perfusion will improve Outcome: Progressing   Problem: Activity: Goal: Risk for activity intolerance will decrease Outcome: Progressing

## 2023-08-19 NOTE — Inpatient Diabetes Management (Signed)
 Inpatient Diabetes Program Recommendations  AACE/ADA: New Consensus Statement on Inpatient Glycemic Control (2015)  Target Ranges:  Prepandial:   less than 140 mg/dL      Peak postprandial:   less than 180 mg/dL (1-2 hours)      Critically ill patients:  140 - 180 mg/dL   Lab Results  Component Value Date   GLUCAP 223 (H) 08/19/2023   HGBA1C 10.8 (H) 08/15/2023    Received referral for DM and insulin  teaching.  Please refer to note on 08/17/23.  Thank you, Hays Lipschutz, MSN, CDCES Diabetes Coordinator Inpatient Diabetes Program [phone] (team pager from 8a-5p)

## 2023-08-19 NOTE — Progress Notes (Signed)
    [facility]    Date of Admission:  08/14/2023     ID: [name] is a 53 y.o. male with   Principal Problem:   CAP (community acquired pneumonia) Active Problems:   HIV disease ([name])   Chronic systolic CHF (congestive heart failure) ([name])   Type 2 diabetes mellitus with hyperlipidemia ([name])   COPD (chronic obstructive pulmonary disease) with emphysema ([name])   CKD stage 3a, GFR 45-59 ml/min ([name])    Subjective: Afebrile. But still having a productive cough with associated shortness of breath  Medications:   budesonide -glycopyrrolate -formoterol   1 puff Inhalation BID   Darunavir -Cobicistat -Emtricitabine -Tenofovir  Alafenamide  1 tablet Oral Q breakfast   docusate sodium   100 mg Oral BID   dolutegravir   50 mg Oral Daily   enoxaparin  (LOVENOX ) injection  40 mg Subcutaneous Daily   feeding supplement  237 mL Oral BID BM   fluconazole   200 mg Oral Daily   guaiFENesin   600 mg Oral BID   hydrALAZINE  50 mg Oral Q8H   insulin  aspart  0-15 Units Subcutaneous TID WC   insulin  aspart  0-5 Units Subcutaneous QHS   insulin  glargine-yfgn  35 Units Subcutaneous BID   ipratropium-albuterol   3 mL Nebulization BID   isosorbide mononitrate  15 mg Oral Daily   rosuvastatin   20 mg Oral Daily   sulfamethoxazole -trimethoprim   1 tablet Oral Daily    Objective: Vital signs in last 24 hours: Temp:  [97.9 F (36.6 C)-98.5 F (36.9 C)] 98.3 F (36.8 C) (06/05 1600) Pulse Rate:  [104-106] 106 (06/05 1600) Resp:  [16-20] 17 (06/05 1600) BP: (107-121)/(64-73) 109/71 (06/05 1600) SpO2:  [100 %] 100 % (06/05 1600)  Physical Exam  Constitutional: He is oriented to person, place, and time. He appears well-developed and well-nourished. No distress.  HENT:  Mouth/Throat: Oropharynx is clear and moist. No oropharyngeal exudate.  Cardiovascular: Normal rate, regular rhythm and normal heart sounds. Exam reveals no gallop and no friction rub.  No murmur heard.   Pulmonary/Chest: Effort normal and breath sounds normal. Wheezing and rhonchi Abdominal: Soft. Bowel sounds are normal. He exhibits no distension. There is no tenderness.  Lymphadenopathy:  He has no cervical adenopathy.  Neurological: He is alert and oriented to person, place, and time.  Skin: Skin is warm and dry. No rash noted. No erythema.  Psychiatric: He has a normal mood and affect. His behavior is normal.    Lab Results Recent Labs    08/18/23 0435 08/19/23 0436  WBC 6.9 6.4  HGB 13.3 14.5  HCT 39.7 42.8  NA 130* 133*  K 4.7 4.9  CL 95* 97*  CO2 27 27  BUN 28* 31*  CREATININE 1.17 1.48*    C-Reactive Protein Recent Labs    08/18/23 0435 08/19/23 0436  CRP 2.5* 1.3*    Microbiology: reviewed Studies/Results: No results found.   Assessment/Plan: Pneumonia = continue on ceftiraxone and azithromycin . Today is day 5. Continue on steroid taper since he has hx of emphysema.   Hiv disease= well controlled. Continue on tivicay  and symtuza   Oi proph = continue on bactrim  ds daily     [facility] Pager: [phone]  08/19/2023, 6:15 PM

## 2023-08-20 ENCOUNTER — Other Ambulatory Visit (HOSPITAL_COMMUNITY): Payer: Self-pay

## 2023-08-20 ENCOUNTER — Telehealth (HOSPITAL_COMMUNITY): Payer: Self-pay | Admitting: Pharmacy Technician

## 2023-08-20 ENCOUNTER — Inpatient Hospital Stay (HOSPITAL_COMMUNITY)

## 2023-08-20 DIAGNOSIS — J189 Pneumonia, unspecified organism: Secondary | ICD-10-CM | POA: Diagnosis not present

## 2023-08-20 DIAGNOSIS — M7989 Other specified soft tissue disorders: Secondary | ICD-10-CM | POA: Diagnosis not present

## 2023-08-20 LAB — FUNGITELL BETA-D-GLUCAN
Fungitell Value:: 43.548 pg/mL
Result Name:: NEGATIVE

## 2023-08-20 LAB — BASIC METABOLIC PANEL WITH GFR
Anion gap: 9 (ref 5–15)
BUN: 32 mg/dL — ABNORMAL HIGH (ref 6–20)
CO2: 30 mmol/L (ref 22–32)
Calcium: 9.9 mg/dL (ref 8.9–10.3)
Chloride: 98 mmol/L (ref 98–111)
Creatinine, Ser: 1.38 mg/dL — ABNORMAL HIGH (ref 0.61–1.24)
GFR, Estimated: >60 mL/min (ref >60)
Glucose, Bld: 61 mg/dL — ABNORMAL LOW (ref 70–99)
Potassium: 4.1 mmol/L (ref 3.5–5.1)
Sodium: 137 mmol/L (ref 135–145)

## 2023-08-20 LAB — MAGNESIUM: Magnesium: 2 mg/dL (ref 1.7–2.4)

## 2023-08-20 LAB — CBC WITH DIFFERENTIAL/PLATELET
Abs Immature Granulocytes: 0.03 10*3/uL (ref 0.00–0.07)
Basophils Absolute: 0 10*3/uL (ref 0.0–0.1)
Basophils Relative: 0 %
Eosinophils Absolute: 0.1 10*3/uL (ref 0.0–0.5)
Eosinophils Relative: 2 %
HCT: 47.1 % (ref 39.0–52.0)
Hemoglobin: 15.6 g/dL (ref 13.0–17.0)
Immature Granulocytes: 1 %
Lymphocytes Relative: 40 %
Lymphs Abs: 2.2 10*3/uL (ref 0.7–4.0)
MCH: 31.8 pg (ref 26.0–34.0)
MCHC: 33.1 g/dL (ref 30.0–36.0)
MCV: 95.9 fL (ref 80.0–100.0)
Monocytes Absolute: 0.7 10*3/uL (ref 0.1–1.0)
Monocytes Relative: 13 %
Neutro Abs: 2.4 10*3/uL (ref 1.7–7.7)
Neutrophils Relative %: 44 %
Platelets: 293 10*3/uL (ref 150–400)
RBC: 4.91 MIL/uL (ref 4.22–5.81)
RDW: 11.8 % (ref 11.5–15.5)
WBC: 5.5 10*3/uL (ref 4.0–10.5)
nRBC: 0 % (ref 0.0–0.2)

## 2023-08-20 LAB — GLUCOSE, CAPILLARY
Glucose-Capillary: 116 mg/dL — ABNORMAL HIGH (ref 70–99)
Glucose-Capillary: 177 mg/dL — ABNORMAL HIGH (ref 70–99)
Glucose-Capillary: 223 mg/dL — ABNORMAL HIGH (ref 70–99)
Glucose-Capillary: 232 mg/dL — ABNORMAL HIGH (ref 70–99)
Glucose-Capillary: 71 mg/dL (ref 70–99)
Glucose-Capillary: 85 mg/dL (ref 70–99)

## 2023-08-20 LAB — C-REACTIVE PROTEIN: CRP: 0.8 mg/dL (ref <1.0)

## 2023-08-20 LAB — BRAIN NATRIURETIC PEPTIDE: B Natriuretic Peptide: 5.5 pg/mL (ref 0.0–100.0)

## 2023-08-20 LAB — PROCALCITONIN: Procalcitonin: <0.1 ng/mL

## 2023-08-20 MED ORDER — APIXABAN 5 MG PO TABS
5.0000 mg | ORAL_TABLET | Freq: Two times a day (BID) | ORAL | Status: DC
  Administered 2023-08-20 – 2023-08-21 (×3): 5 mg via ORAL
  Filled 2023-08-20 (×3): qty 1

## 2023-08-20 MED ORDER — APIXABAN 2.5 MG PO TABS
ORAL_TABLET | ORAL | 0 refills | Status: DC
  Filled 2023-08-20: qty 60, 23d supply, fill #0

## 2023-08-20 MED ORDER — ACCU-CHEK SOFTCLIX LANCETS MISC
1.0000 | Freq: Three times a day (TID) | 0 refills | Status: AC
Start: 2023-08-20 — End: 2023-09-20
  Filled 2023-08-20 – 2023-08-21 (×2): qty 100, 30d supply, fill #0

## 2023-08-20 MED ORDER — ROSUVASTATIN CALCIUM 20 MG PO TABS
20.0000 mg | ORAL_TABLET | Freq: Every day | ORAL | 0 refills | Status: AC
  Filled 2023-08-20: qty 30, 30d supply, fill #0

## 2023-08-20 MED ORDER — LANCET DEVICE MISC
1.0000 | Freq: Three times a day (TID) | 0 refills | Status: AC
  Filled 2023-08-20: qty 1, 30d supply, fill #0

## 2023-08-20 MED ORDER — ISOSORBIDE MONONITRATE ER 30 MG PO TB24
15.0000 mg | ORAL_TABLET | Freq: Every day | ORAL | 0 refills | Status: DC
  Filled 2023-08-20: qty 15, 30d supply, fill #0

## 2023-08-20 MED ORDER — INSULIN PEN NEEDLE 32G X 4 MM MISC
0 refills | Status: AC
  Filled 2023-08-20 – 2023-08-21 (×2): qty 100, 25d supply, fill #0

## 2023-08-20 MED ORDER — SULFAMETHOXAZOLE-TRIMETHOPRIM 800-160 MG PO TABS
1.0000 | ORAL_TABLET | Freq: Every day | ORAL | 0 refills | Status: DC
  Filled 2023-08-20 (×2): qty 30, 30d supply, fill #0

## 2023-08-20 MED ORDER — SYMTUZA 800-150-200-10 MG PO TABS
1.0000 | ORAL_TABLET | Freq: Every day | ORAL | 0 refills | Status: DC
Start: 2023-08-20 — End: 2023-09-30
  Filled 2023-08-20: qty 30, 30d supply, fill #0

## 2023-08-20 MED ORDER — INSULIN GLARGINE 100 UNIT/ML SOLOSTAR PEN
36.0000 [IU] | PEN_INJECTOR | Freq: Every day | SUBCUTANEOUS | 0 refills | Status: DC
  Filled 2023-08-20 – 2023-08-21 (×2): qty 15, 41d supply, fill #0

## 2023-08-20 MED ORDER — BLOOD GLUCOSE MONITOR SYSTEM W/DEVICE KIT
1.0000 | PACK | Freq: Three times a day (TID) | 0 refills | Status: DC
  Filled 2023-08-20 – 2023-08-21 (×2): qty 1, 30d supply, fill #0

## 2023-08-20 MED ORDER — APIXABAN 2.5 MG PO TABS: 2.5000 mg | ORAL_TABLET | Freq: Two times a day (BID) | ORAL | Status: DC

## 2023-08-20 MED ORDER — HYDRALAZINE HCL 50 MG PO TABS
50.0000 mg | ORAL_TABLET | Freq: Three times a day (TID) | ORAL | 0 refills | Status: DC
  Filled 2023-08-20: qty 90, 30d supply, fill #0

## 2023-08-20 MED ORDER — BLOOD GLUCOSE TEST VI STRP
1.0000 | ORAL_STRIP | Freq: Three times a day (TID) | 0 refills | Status: AC
  Filled 2023-08-20 – 2023-08-21 (×2): qty 100, 30d supply, fill #0

## 2023-08-20 MED ORDER — ALBUTEROL SULFATE HFA 108 (90 BASE) MCG/ACT IN AERS
2.0000 | INHALATION_SPRAY | Freq: Four times a day (QID) | RESPIRATORY_TRACT | 0 refills | Status: DC | PRN
Start: 2023-08-20 — End: 2023-12-06
  Filled 2023-08-20: qty 6.7, 25d supply, fill #0

## 2023-08-20 MED ORDER — INSULIN LISPRO (1 UNIT DIAL) 100 UNIT/ML (KWIKPEN)
PEN_INJECTOR | SUBCUTANEOUS | 0 refills | Status: DC
  Filled 2023-08-20: qty 9, 30d supply, fill #0
  Filled 2023-08-21: qty 15, 30d supply, fill #0

## 2023-08-20 MED ORDER — TIVICAY 50 MG PO TABS
50.0000 mg | ORAL_TABLET | Freq: Every day | ORAL | 0 refills | Status: DC
  Filled 2023-08-20: qty 30, 30d supply, fill #0

## 2023-08-20 MED ORDER — CEPHALEXIN 500 MG PO CAPS
500.0000 mg | ORAL_CAPSULE | Freq: Three times a day (TID) | ORAL | 0 refills | Status: AC
  Filled 2023-08-20 (×2): qty 15, 5d supply, fill #0

## 2023-08-20 MED ORDER — FLUCONAZOLE 200 MG PO TABS
200.0000 mg | ORAL_TABLET | Freq: Every day | ORAL | 0 refills | Status: DC
  Filled 2023-08-20: qty 10, 15d supply, fill #0
  Filled 2023-08-20: qty 10, 10d supply, fill #0

## 2023-08-20 NOTE — Plan of Care (Signed)
 Problem: Education: Goal: Ability to describe self-care measures that may prevent or decrease complications (Diabetes Survival Skills Education) will improve 08/20/2023 0439 by Lolita Rise, RN Outcome: Progressing 08/20/2023 0439 by Lolita Rise, RN Outcome: Progressing Goal: Individualized Educational Video(s) 08/20/2023 0439 by Lolita Rise, RN Outcome: Progressing 08/20/2023 0439 by Lolita Rise, RN Outcome: Progressing   Problem: Coping: Goal: Ability to adjust to condition or change in health will improve 08/20/2023 0439 by Lolita Rise, RN Outcome: Progressing 08/20/2023 0439 by Lolita Rise, RN Outcome: Progressing   Problem: Fluid Volume: Goal: Ability to maintain a balanced intake and output will improve 08/20/2023 0439 by Lolita Rise, RN Outcome: Progressing 08/20/2023 0439 by Lolita Rise, RN Outcome: Progressing   Problem: Health Behavior/Discharge Planning: Goal: Ability to identify and utilize available resources and services will improve 08/20/2023 0439 by Lolita Rise, RN Outcome: Progressing 08/20/2023 0439 by Lolita Rise, RN Outcome: Progressing Goal: Ability to manage health-related needs will improve 08/20/2023 0439 by Lolita Rise, RN Outcome: Progressing 08/20/2023 0439 by Lolita Rise, RN Outcome: Progressing   Problem: Metabolic: Goal: Ability to maintain appropriate glucose levels will improve 08/20/2023 0439 by Lolita Rise, RN Outcome: Progressing 08/20/2023 0439 by Lolita Rise, RN Outcome: Progressing   Problem: Nutritional: Goal: Maintenance of adequate nutrition will improve 08/20/2023 0439 by Lolita Rise, RN Outcome: Progressing 08/20/2023 0439 by Lolita Rise, RN Outcome: Progressing Goal: Progress toward achieving an optimal weight will improve 08/20/2023 0439 by Lolita Rise, RN Outcome: Progressing 08/20/2023 0439 by Lolita Rise, RN Outcome:  Progressing   Problem: Skin Integrity: Goal: Risk for impaired skin integrity will decrease 08/20/2023 0439 by Lolita Rise, RN Outcome: Progressing 08/20/2023 0439 by Lolita Rise, RN Outcome: Progressing   Problem: Tissue Perfusion: Goal: Adequacy of tissue perfusion will improve 08/20/2023 0439 by Lolita Rise, RN Outcome: Progressing 08/20/2023 0439 by Lolita Rise, RN Outcome: Progressing   Problem: Education: Goal: Knowledge of General Education information will improve Description: Including pain rating scale, medication(s)/side effects and non-pharmacologic comfort measures 08/20/2023 0439 by Lolita Rise, RN Outcome: Progressing 08/20/2023 0439 by Lolita Rise, RN Outcome: Progressing   Problem: Health Behavior/Discharge Planning: Goal: Ability to manage health-related needs will improve 08/20/2023 0439 by Lolita Rise, RN Outcome: Progressing 08/20/2023 0439 by Lolita Rise, RN Outcome: Progressing   Problem: Clinical Measurements: Goal: Ability to maintain clinical measurements within normal limits will improve 08/20/2023 0439 by Lolita Rise, RN Outcome: Progressing 08/20/2023 0439 by Lolita Rise, RN Outcome: Progressing Goal: Will remain free from infection 08/20/2023 0439 by Lolita Rise, RN Outcome: Progressing 08/20/2023 0439 by Lolita Rise, RN Outcome: Progressing Goal: Diagnostic test results will improve 08/20/2023 0439 by Lolita Rise, RN Outcome: Progressing 08/20/2023 0439 by Lolita Rise, RN Outcome: Progressing Goal: Respiratory complications will improve 08/20/2023 0439 by Lolita Rise, RN Outcome: Progressing 08/20/2023 0439 by Lolita Rise, RN Outcome: Progressing Goal: Cardiovascular complication will be avoided 08/20/2023 0439 by Lolita Rise, RN Outcome: Progressing 08/20/2023 0439 by Lolita Rise, RN Outcome: Progressing   Problem: Activity: Goal: Risk  for activity intolerance will decrease 08/20/2023 0439 by Lolita Rise, RN Outcome: Progressing 08/20/2023 0439 by Lolita Rise, RN Outcome: Progressing   Problem: Nutrition: Goal: Adequate nutrition will be maintained 08/20/2023 0439 by Lolita Rise, RN Outcome: Progressing 08/20/2023 0439 by Lolita Rise, RN Outcome: Progressing  Problem: Coping: Goal: Level of anxiety will decrease 08/20/2023 0439 by Lolita Rise, RN Outcome: Progressing 08/20/2023 0439 by Lolita Rise, RN Outcome: Progressing   Problem: Elimination: Goal: Will not experience complications related to bowel motility 08/20/2023 0439 by Lolita Rise, RN Outcome: Progressing 08/20/2023 0439 by Lolita Rise, RN Outcome: Progressing Goal: Will not experience complications related to urinary retention 08/20/2023 0439 by Lolita Rise, RN Outcome: Progressing 08/20/2023 0439 by Lolita Rise, RN Outcome: Progressing   Problem: Pain Managment: Goal: General experience of comfort will improve and/or be controlled 08/20/2023 0439 by Lolita Rise, RN Outcome: Progressing 08/20/2023 0439 by Lolita Rise, RN Outcome: Progressing   Problem: Safety: Goal: Ability to remain free from injury will improve 08/20/2023 0439 by Lolita Rise, RN Outcome: Progressing 08/20/2023 0439 by Lolita Rise, RN Outcome: Progressing   Problem: Skin Integrity: Goal: Risk for impaired skin integrity will decrease 08/20/2023 0439 by Lolita Rise, RN Outcome: Progressing 08/20/2023 0439 by Lolita Rise, RN Outcome: Progressing   Problem: Activity: Goal: Ability to tolerate increased activity will improve 08/20/2023 0439 by Lolita Rise, RN Outcome: Progressing 08/20/2023 0439 by Lolita Rise, RN Outcome: Progressing   Problem: Clinical Measurements: Goal: Ability to maintain a body temperature in the normal range will improve 08/20/2023 0439 by Lolita Rise, RN Outcome: Progressing 08/20/2023 0439 by Lolita Rise, RN Outcome: Progressing   Problem: Respiratory: Goal: Ability to maintain adequate ventilation will improve 08/20/2023 0439 by Lolita Rise, RN Outcome: Progressing 08/20/2023 0439 by Lolita Rise, RN Outcome: Progressing Goal: Ability to maintain a clear airway will improve 08/20/2023 0439 by Lolita Rise, RN Outcome: Progressing 08/20/2023 0439 by Lolita Rise, RN Outcome: Progressing

## 2023-08-20 NOTE — Telephone Encounter (Signed)
 Patient Product/process development scientist completed.    The patient is insured through [employer]. Patient has [employer], may use a copay card, and/or apply for patient assistance if available.    Ran test claim for Eliquis  2.5 mg and the current 30 day co-pay is $0.00.   This test claim was processed through [facility]- copay amounts may vary at other pharmacies due to pharmacy/plan contracts, or as the patient moves through the different stages of their insurance plan.     Morgan Arab, CPHT Pharmacy Technician III Certified Patient Advocate [facility] Patient Advocate Team Direct Number: [phone]  Fax: [phone]

## 2023-08-20 NOTE — Plan of Care (Signed)
                                      [facility]                            [address] [place], [place] [address]      [name] was admitted to the Hospital on 08/14/2023 and Discharged  08/20/2023 and should be excused from work/school   for 10 days starting from date -  08/14/2023 , may return to work/school without any restrictions.  Call Lynnwood Sauer MD, [facility]  [phone] with questions.  Lynnwood Sauer M.D on 08/20/2023,at 9:08 AM  [facility]   Office  [phone]

## 2023-08-20 NOTE — Plan of Care (Signed)
  Problem: Education: Goal: Ability to describe self-care measures that may prevent or decrease complications (Diabetes Survival Skills Education) will improve Outcome: Progressing   Problem: Coping: Goal: Ability to adjust to condition or change in health will improve Outcome: Progressing   Problem: Fluid Volume: Goal: Ability to maintain a balanced intake and output will improve Outcome: Progressing   Problem: Health Behavior/Discharge Planning: Goal: Ability to identify and utilize available resources and services will improve Outcome: Progressing   Problem: Nutritional: Goal: Maintenance of adequate nutrition will improve Outcome: Progressing

## 2023-08-20 NOTE — Discharge Instructions (Addendum)
 Follow with Primary MD [name], [name], MD in 7 days   Get CBC, CMP, 2 view Chest X ray -  checked next visit with your primary MD   Activity: As tolerated with Full fall precautions use walker/cane & assistance as needed  Disposition Home   Diet: Heart Healthy carbohydrate diet, check CBGs q. [facility] S.  Special Instructions: If you have smoked or chewed Tobacco  in the last 2 yrs please stop smoking, stop any regular Alcohol  and or any Recreational drug use.  On your next visit with your primary care physician please Get Medicines reviewed and adjusted.  Please request your Prim.MD to go over all Hospital Tests and Procedure/Radiological results at the follow up, please get all Hospital records sent to your Prim MD by signing hospital release before you go home.  If you experience worsening of your admission symptoms, develop shortness of breath, life threatening emergency, suicidal or homicidal thoughts you must seek medical attention immediately by calling 911 or calling your MD immediately  if symptoms less severe.  You Must read complete instructions/literature along with all the possible adverse reactions/side effects for all the Medicines you take and that have been prescribed to you. Take any new Medicines after you have completely understood and accpet all the possible adverse reactions/side effects.   Do not drive when taking Pain medications.  Do not take more than prescribed Pain, Sleep and Anxiety Medications  Wear Seat belts while driving.   =========================================================================================================================  Information on my medicine - ELIQUIS  (apixaban )  This medication education was reviewed with me or my healthcare representative as part of my discharge preparation.    Why was Eliquis  prescribed for you? Eliquis  was prescribed to treat blood clots that may have been found in the veins of your legs  (deep vein thrombosis) or in your lungs (pulmonary embolism) and to reduce the risk of them occurring again.  What do You need to know about Eliquis  ? The starting dose is 5 mg taken TWICE daily for the FIRST SEVEN (7) DAYS, then on 08/27/23  the dose is reduced to ONE 2.5 mg taken TWICE daily.  Eliquis  may be taken with or without food.   Try to take the dose about the same time in the morning and in the evening. If you have difficulty swallowing the tablet whole please discuss with your pharmacist how to take the medication safely.  Take Eliquis  exactly as prescribed and DO NOT stop taking Eliquis  without talking to the doctor who prescribed the medication.  Stopping may increase your risk of developing a new blood clot.  Refill your prescription before you run out.  After discharge, you should have regular check-up appointments with your healthcare provider that is prescribing your Eliquis .    What do you do if you miss a dose? If a dose of ELIQUIS  is not taken at the scheduled time, take it as soon as possible on the same day and twice-daily administration should be resumed. The dose should not be doubled to make up for a missed dose.  Important Safety Information A possible side effect of Eliquis  is bleeding. You should call your healthcare provider right away if you experience any of the following: Bleeding from an injury or your nose that does not stop. Unusual colored urine (red or dark brown) or unusual colored stools (red or black). Unusual bruising for unknown reasons. A serious fall or if you hit your head (even if there is no bleeding).  Some medicines may  interact with Eliquis  and might increase your risk of bleeding or clotting while on Eliquis . To help avoid this, consult your healthcare provider or pharmacist prior to using any new prescription or non-prescription medications, including herbals, vitamins, non-steroidal anti-inflammatory drugs (NSAIDs) and  supplements.  This website has more information on Eliquis  (apixaban ): http://[email or web address] .com/eliquis Romaine Closs

## 2023-08-20 NOTE — Progress Notes (Signed)
 BLE venous duplex has been completed.  Preliminary results given to Dr. Zelda Hickman.   Results can be found under chart review under CV PROC. 08/20/2023 12:36 PM Jaylia Pettus RVT, RDMS

## 2023-08-20 NOTE — TOC Transition Note (Signed)
 Transition of Care [facility]) - Discharge Note   Patient Details  Name: [name] MRN: [id number] Date of Birth: 09/04/1970  [facility]) CM/SW Contact:  Eusebio High, RN Phone Number: 08/20/2023, 10:56 AM   Clinical Narrative:     Patient is to DC to home today.  Patient is a current O2 patient of [employer] and he will need a portable O2.  RNCM had interpreter Luane Rumps [phone] to discuss arranging payment with [employer]. [employer] states that patient has not made payments and owes them money Patient agrees that when he gets home he will give CC # and make payment arrangements.  Patient explained that when [employer] called before, they were speaking English and he speaks [place] so he hung up because he could not understand. RNCM gave this information to liaison, Shanon Darting. [phone].   Portable to return home is now in patient's room. [employer] will go to patient's home and look at concentrator to make sure all is in working order.   No additional TOC needs             Patient Goals and CMS Choice            Discharge Placement                       Discharge Plan and Services Additional resources added to the After Visit Summary for                                       Social Drivers of Health (SDOH) Interventions SDOH Screenings   Food Insecurity: Food Insecurity Present (08/15/2023)  Housing: High Risk (08/15/2023)  Transportation Needs: Unmet Transportation Needs (08/15/2023)  Utilities: Not At Risk (08/15/2023)  Depression (PHQ2-9): Low Risk  (06/09/2023)  Social Connections: Socially Integrated (08/15/2023)  Tobacco Use: Medium Risk (08/15/2023)     Readmission Risk Interventions    04/09/2023   12:37 PM 08/21/2022    3:13 PM 07/02/2022   12:34 PM  Readmission Risk Prevention Plan  Post Dischage Appt   Complete  Medication Screening   Complete  Transportation Screening Complete Complete Complete  PCP or Specialist Appt within 5-7 Days  Complete Complete   Home Care Screening Complete Complete   Medication Review (RN CM) Complete Complete

## 2023-08-20 NOTE — Discharge Summary (Addendum)
 [name] XBJ:[id number] DOB: [birth year] DOA: 08/14/2023  PCP: [name]  Admit date: 08/14/2023  Discharge date: 08/21/2023  Admitted From: Home   Disposition:  Home   Recommendations for Outpatient Follow-up:   Follow up with PCP in 1-2 weeks  PCP Please obtain BMP/CBC, 2 view CXR in 1week,  (see Discharge instructions)   PCP Please follow up on the following pending results:    Home Health: None   Equipment/Devices: o2  Consultations: ID Discharge Condition: Stable    CODE STATUS: Full    Diet Recommendation: Heart Healthy Low Carb, check CBGs q. ACH S.   Chief Complaint  Patient presents with   Shortness of Breath     Brief history of present illness from the day of admission and additional interim summary    53 y.o. male with medical history significant of bronchiectasis, COPD on home O2, HIV, type 2 diabetes mellitus, CHF, colon cancer status post resection in 2020, history of pulm embolism.  He presented to the emergency room with complaints of worsening productive cough and shortness of breath over the past 4 days, diagnosed with acute on chronic hypoxic respiratory failure due to pneumonia and admitted to the hospital COPD (chronic obstructive pulmonary disease) with emphysema ([name]) and underlying bronchiectasis. Advanced underlying disease, on 2 to 3 L nasal cannula oxygen at home, currently see above, continue supplemental oxygen and home nebulizer treatments, has no wheezing will discontinue systemic steroids and monitor.                                                                   Hospital Course   Chronic hypoxic respiratory failure in a patient with underlying COPD, bronchiectasis and HIV due to CAP (community acquired pneumonia)   Has been placed on  appropriate empiric antibiotics, encouraged to sit in chair use I-S and flutter valve for pulmonary toiletry, supplemental oxygen, of note he uses 2  L of home oxygen, now symptom-free on 2 L nasal cannula oxygen, plan discussed with Dr. Artemio Parsons on 08/20/2023 he will be discharged home on 5 more days of oral Keflex , continue 5 more days of oral fluconazole .  Other home medications as below will be continued indefinitely with outpatient follow-up with PCP and ID postdischarge.  He is currently symptom-free.       Per ID     HIV = continue on tivicay  and symtuza    Pneumonia = continue on ceftriaxone  plus bactrim    Thrush = continue on fluconazole    Chronic diastolic CHF.  Last EF is 50% on echocardiogram done in 2023. Euvolemic, no changes in home medications, blood pressure slightly on the softer side hence taken off of hydralazine .  PCP to monitor.   AKI CKD stage 3a, GFR 45-59 ml/min ([name]) Renal function improved, hold ARB,  monitor.  Baseline creatinine close to 1.5.   Marginally elevated D-dimer.  CTA negative, left lower extremity popliteal vein age indeterminant clot, placed on Eliquis , follow-up with PCP.  [facility] dose adjusted for patient's medical history and renal function by pharmacy.   HIV disease.  Question compliance, in the past had been in poor control, continue antiretroviral therapy along with Bactrim  for PJP prophylaxis, low CD4 count of 58/14%, HIV viral load < 20 copies/ml, ID on board, on Tivicay  and Symtuza  .  HIV medications will be delivered directly to the patient by the ID team.   Type 2 diabetes mellitus with hyperlipidemia ([name]) Poor outpatient control as suggested by elevated A1c, had DKA upon admission, placed on long and short acting insulin , education provided, supplies provided upon discharge.  Lab Results  Component Value Date   HGBA1C 10.8 (H) 08/15/2023     Discharge diagnosis     Principal Problem:   CAP (community acquired pneumonia) Active  Problems:   COPD (chronic obstructive pulmonary disease) with emphysema ([name])   Chronic systolic CHF (congestive heart failure) ([name])   CKD stage 3a, GFR 45-59 ml/min ([name])   Type 2 diabetes mellitus with hyperlipidemia ([name])   HIV disease [facility])    Discharge instructions    Discharge Instructions     Discharge instructions   Complete by: As directed    Follow with Primary Parsons [name], [name] in 7 days   Get CBC, CMP, 2 view Chest X ray -  checked next visit with your primary Parsons   Activity: As tolerated with Full fall precautions use walker/cane & assistance as needed  Disposition Home   Diet: Heart Healthy carbohydrate diet, check CBGs q. ACH S.  Special Instructions: If you have smoked or chewed Tobacco  in the last 2 yrs please stop smoking, stop any regular Alcohol  and or any Recreational drug use.  On your next visit with your primary care physician please Get Medicines reviewed and adjusted.  Please request your Prim.Parsons to go over all Hospital Tests and Procedure/Radiological results at the follow up, please get all Hospital records sent to your Prim Parsons by signing hospital release before you go home.  If you experience worsening of your admission symptoms, develop shortness of breath, life threatening emergency, suicidal or homicidal thoughts you must seek medical attention immediately by calling 911 or calling your Parsons immediately  if symptoms less severe.  You Must read complete instructions/literature along with all the possible adverse reactions/side effects for all the Medicines you take and that have been prescribed to you. Take any new Medicines after you have completely understood and accpet all the possible adverse reactions/side effects.   Do not drive when taking Pain medications.  Do not take more than prescribed Pain, Sleep and Anxiety Medications  Wear Seat belts while driving.   For home use only DME oxygen   Complete by: As directed    Length  of Need: 6 Months   Mode or (Route): Nasal cannula   Liters per Minute: 2   Frequency: Continuous (stationary and portable oxygen unit needed)   Oxygen conserving device: Yes   Oxygen delivery system: Gas   Increase activity slowly   Complete by: As directed        Discharge Medications   Allergies as of 08/21/2023       Reactions   Metformin  And Related Rash        Medication List     TAKE these medications  Accu-Chek Softclix Lancets lancets Use as directed in the morning, at noon, and at bedtime.   albuterol  108 (90 Base) MCG/ACT inhaler Commonly known as: VENTOLIN  HFA Inhale 2 puffs into the lungs every 6 (six) hours as needed for wheezing or shortness of breath.   benzonatate  100 MG capsule Commonly known as: Tessalon  Perles Take 1 capsule (100 mg total) by mouth 3 (three) times daily as needed for cough.   Blood Glucose Monitor System w/Device Kit Use as directed in the morning, at noon, and at bedtime.   BLOOD GLUCOSE TEST STRIPS Strp Use as directed in the morning, at noon, and at bedtime. May substitute to any manufacturer covered by patient's insurance.   calcium  carbonate 500 MG chewable tablet Commonly known as: TUMS - dosed in mg elemental calcium  Chew 1 tablet by mouth as needed for indigestion or heartburn.   cephALEXin  500 MG capsule Commonly known as: KEFLEX  Take 1 capsule (500 mg total) by mouth 3 (three) times daily for 3 days.   Eliquis  2.5 MG Tabs tablet Generic drug: apixaban  Take 2 tablets (5 mg total) by mouth 2 (two) times daily for 7 days, THEN 1 tablet (2.5 mg total) 2 (two) times daily. Start taking on: August 20, 2023   fluconazole  200 MG tablet Commonly known as: DIFLUCAN  Take 1 tablet (200 mg total) by mouth daily.   insulin  glargine 100 UNIT/ML Solostar Pen Commonly known as: LANTUS  Inject 36 Units into the skin at bedtime.   insulin  lispro 100 UNIT/ML KwikPen Commonly known as: HumaLOG  KwikPen Before each meal 3 times a  day, 140-199 - 2 units, 200-250 - 4 units, 251-299 - 6 units,  300-349 - 8 units,  350 or above 10 units. Insulin  PEN if approved, provide syringes and needles if needed.Please switch to any approved short acting Insulin  if needed.   Insulin  Pen Needle 32G X 4 MM Misc Use 4 times a day   isosorbide  mononitrate 30 MG 24 hr tablet Commonly known as: IMDUR  Take 0.5 tablets (15 mg total) by mouth daily.   Lancet Device Misc 1 each by Does not apply route in the morning, at noon, and at bedtime. May substitute to any manufacturer covered by patient's insurance.   MULTIVITAMIN ADULTS PO Take 1 each by mouth daily.   rosuvastatin  20 MG tablet Commonly known as: Crestor  Take 1 tablet (20 mg total) by mouth daily.   sitaGLIPtin -metformin  50-500 MG tablet Commonly known as: JANUMET  Take 1 tablet by mouth 2 (two) times daily with a meal.   sulfamethoxazole -trimethoprim  800-160 MG tablet Commonly known as: BACTRIM  DS Take 1 tablet by mouth daily.   Symtuza  800-150-200-10 MG Tabs Generic drug: Darunavir -Cobicistat -Emtricitabine -Tenofovir  Alafenamide Take 1 tablet by mouth daily with breakfast.   Tivicay  50 MG tablet Generic drug: dolutegravir  Take 1 tablet (50 mg total) by mouth daily.   VITAMIN C PO Take 1 tablet by mouth daily.               Durable Medical Equipment  (From admission, onward)           Start     Ordered   08/20/23 0000  For home use only DME oxygen       Question Answer Comment  Length of Need 6 Months   Mode or (Route) Nasal cannula   Liters per Minute 2   Frequency Continuous (stationary and portable oxygen unit needed)   Oxygen conserving device Yes   Oxygen delivery system Gas  08/20/23 0916             Follow-up Information     [name], [name]. Schedule an appointment as soon as possible for a visit in 1 week(s).   Specialty: Internal Medicine Contact information: [address] [place]  [address] [phone]         Liane Redman, Parsons. Schedule an appointment as soon as possible for a visit in 1 week(s).   Specialty: Infectious Diseases Contact information: [address] [place] [address] [phone]                 Major procedures and Radiology Reports - PLEASE review detailed and final reports thoroughly  -       VAS US  LOWER EXTREMITY VENOUS (DVT) Result Date: 08/20/2023  Lower Venous DVT Study Patient Name:  AABAN GRIEP  Date of Exam:   08/20/2023 Medical Rec #: [id number]           Accession #:    [id number] Date of Birth: 06/02/1970            Patient Gender: M Patient Age:   53 years Exam Location:  [facility] Procedure:      VAS US  LOWER EXTREMITY VENOUS (DVT) Referring Phys: Landon Pinion [facility] --------------------------------------------------------------------------------  Indications: Swelling, and elevated D-dimer (0.59).  Risk Factors: History of tuberculosis. HIV/AIDS. History of DVT/PE Comparison Study: Previous exam on 11/03/2020 was positive for RLE DVT (peroneal                   & posterior tibial veins) Performing Technologist: Arlyce Berger RVT, RDMS  Examination Guidelines: A complete evaluation includes B-mode imaging, spectral Doppler, color Doppler, and power Doppler as needed of all accessible portions of each vessel. Bilateral testing is considered an integral part of a complete examination. Limited examinations for reoccurring indications may be performed as noted. The reflux portion of the exam is performed with the patient in reverse Trendelenburg.  +---------+---------------+---------+-----------+----------+--------------+ RIGHT    CompressibilityPhasicitySpontaneityPropertiesThrombus Aging +---------+---------------+---------+-----------+----------+--------------+ CFV      Full           Yes      Yes                                 +---------+---------------+---------+-----------+----------+--------------+  SFJ      Full                                                        +---------+---------------+---------+-----------+----------+--------------+ FV Prox  Full           Yes      Yes                                 +---------+---------------+---------+-----------+----------+--------------+ FV Mid   Full           Yes      Yes                                 +---------+---------------+---------+-----------+----------+--------------+ FV DistalFull           Yes  Yes                                 +---------+---------------+---------+-----------+----------+--------------+ PFV      Full                                                        +---------+---------------+---------+-----------+----------+--------------+ POP      Full           Yes      Yes                                 +---------+---------------+---------+-----------+----------+--------------+ PTV      Full                                                        +---------+---------------+---------+-----------+----------+--------------+ PERO     Full                                                        +---------+---------------+---------+-----------+----------+--------------+   +--------+---------------+---------+-----------+------------+-----------------+ LEFT    CompressibilityPhasicitySpontaneityProperties  Thrombus Aging    +--------+---------------+---------+-----------+------------+-----------------+ CFV     Full           Yes      Yes        rouleaux                                                                 flow                          +--------+---------------+---------+-----------+------------+-----------------+ SFJ     Full                               rouleaux                                                                 flow                          +--------+---------------+---------+-----------+------------+-----------------+ FV Prox  Full           Yes      Yes        rouleaux  flow                          +--------+---------------+---------+-----------+------------+-----------------+ FV Mid  Full           Yes      Yes                                      +--------+---------------+---------+-----------+------------+-----------------+ FV      Full           Yes      Yes                                      Distal                                                                   +--------+---------------+---------+-----------+------------+-----------------+ PFV     Full                               rouleaux                                                                 flow                          +--------+---------------+---------+-----------+------------+-----------------+ POP     None           No       No                     Age Indeterminate +--------+---------------+---------+-----------+------------+-----------------+ PTV     Full                                                             +--------+---------------+---------+-----------+------------+-----------------+ PERO                                                   Not well                                                                 visualized        +--------+---------------+---------+-----------+------------+-----------------+     Summary: BILATERAL: -No evidence of popliteal cyst, bilaterally. RIGHT: - There is no  evidence of deep vein thrombosis in the lower extremity.  LEFT: - Findings consistent with age indeterminate deep vein thrombosis involving the left popliteal vein.   *See table(s) above for measurements and observations. Electronically signed by Delaney Fearing on 08/20/2023 at 2:42:20 PM.    Final    ECHOCARDIOGRAM COMPLETE Result Date: 08/16/2023    ECHOCARDIOGRAM REPORT   Patient Name:   [name] Date of Exam:  08/16/2023 Medical Rec #:  [id number]          Height:       68.0 in Accession #:    [id number]         Weight:       185.2 lb Date of Birth:  10-Oct-1970           BSA:          1.978 m Patient Age:    53 years           BP:           119/79 mmHg Patient Gender: M                  HR:           100 bpm. Exam Location:  Inpatient Procedure: 2D Echo, Cardiac Doppler and Color Doppler (Both Spectral and Color            Flow Doppler were utilized during procedure). Indications:    CHF  History:        Patient has prior history of Echocardiogram examinations, most                 recent 11/17/2021. CHF.  Sonographer:    Janette Medley Referring Phys: Theodora Fish IMPRESSIONS  1. Left ventricular ejection fraction, by estimation, is 55%. The left ventricle has normal function. The left ventricle has no regional wall motion abnormalities. Left ventricular diastolic parameters are consistent with Grade I diastolic dysfunction (impaired relaxation).  2. Mildly D-shaped interventricular septum suggests a degree of RV pressure/volume overload. Right ventricular systolic function is mildly reduced. The right ventricular size is mildly enlarged. Tricuspid regurgitation signal is inadequate for assessing  PA pressure.  3. Right atrial size was mildly dilated. Chiari network noted in RA.  4. The mitral valve is normal in structure. No evidence of mitral valve regurgitation. No evidence of mitral stenosis.  5. The aortic valve is tricuspid. Aortic valve regurgitation is not visualized. No aortic stenosis is present.  6. Aortic dilatation noted.  7. The inferior vena cava is normal in size with greater than 50% respiratory variability, suggesting right atrial pressure of 3 mmHg. FINDINGS  Left Ventricle: Left ventricular ejection fraction, by estimation, is 55%. The left ventricle has normal function. The left ventricle has no regional wall motion abnormalities. The left ventricular internal cavity size was normal in size. There is  no left ventricular hypertrophy. Left ventricular diastolic parameters are consistent with Grade I diastolic dysfunction (impaired relaxation). Right Ventricle: Mildly D-shaped interventricular septum suggests a degree of RV pressure/volume overload. The right ventricular size is mildly enlarged. No increase in right ventricular wall thickness. Right ventricular systolic function is mildly reduced. Tricuspid regurgitation signal is inadequate for assessing PA pressure. Left Atrium: Left atrial size was normal in size. Right Atrium: Right atrial size was mildly dilated. Pericardium: There is no evidence of pericardial effusion. Mitral Valve: The mitral valve is normal in structure. No evidence of mitral valve regurgitation. No evidence of mitral valve stenosis. Tricuspid Valve:  The tricuspid valve is normal in structure. Tricuspid valve regurgitation is not demonstrated. Aortic Valve: The aortic valve is tricuspid. Aortic valve regurgitation is not visualized. No aortic stenosis is present. Pulmonic Valve: The pulmonic valve was normal in structure. Pulmonic valve regurgitation is not visualized. Aorta: Aortic dilatation noted. Venous: The inferior vena cava is normal in size with greater than 50% respiratory variability, suggesting right atrial pressure of 3 mmHg. IAS/Shunts: No atrial level shunt detected by color flow Doppler.  LEFT VENTRICLE PLAX 2D LVIDd:         5.00 cm   Diastology LVIDs:         3.20 cm   LV e' medial:    11.10 cm/s LV PW:         0.90 cm   LV E/e' medial:  7.9 LV IVS:        0.90 cm   LV e' lateral:   18.40 cm/s LVOT diam:     2.10 cm   LV E/e' lateral: 4.8 LV SV:         77 LV SV Index:   39 LVOT Area:     3.46 cm  RIGHT VENTRICLE             IVC RV S prime:     18.90 cm/s  IVC diam: 1.70 cm TAPSE (M-mode): 2.2 cm LEFT ATRIUM             Index        RIGHT ATRIUM           Index LA diam:        3.20 cm 1.62 cm/m   RA Area:     18.20 cm LA Vol (A2C):   28.8 ml 14.56 ml/m  RA Volume:    49.40 ml  24.97 ml/m LA Vol (A4C):   34.0 ml 17.19 ml/m LA Biplane Vol: 34.2 ml 17.29 ml/m  AORTIC VALVE LVOT Vmax:   133.00 cm/s LVOT Vmean:  93.300 cm/s LVOT VTI:    0.222 m  AORTA Ao Root diam: 2.80 cm Ao Asc diam:  3.20 cm MITRAL VALVE MV Area (PHT): 4.49 cm     SHUNTS MV Decel Time: 169 msec     Systemic VTI:  0.22 m MV E velocity: 87.50 cm/s   Systemic Diam: 2.10 cm MV A velocity: 114.00 cm/s MV E/A ratio:  0.77 Dalton McleanMD Electronically signed by Archer Bear Signature Date/Time: 08/16/2023/2:24:11 PM    Final    CT Angio Chest Pulmonary Embolism (PE) W or WO Contrast Result Date: 08/16/2023 CLINICAL DATA:  Suspected pulmonary embolism EXAM: CT ANGIOGRAPHY CHEST WITH CONTRAST TECHNIQUE: Multidetector CT imaging of the chest was performed using the standard protocol during bolus administration of intravenous contrast. Multiplanar CT image reconstructions and MIPs were obtained to evaluate the vascular anatomy. RADIATION DOSE REDUCTION: This exam was performed according to the departmental dose-optimization program which includes automated exposure control, adjustment of the mA and/or kV according to patient size and/or use of iterative reconstruction technique. CONTRAST:  75mL OMNIPAQUE  IOHEXOL  350 MG/ML SOLN COMPARISON:  CT angio August 19, 2022 FINDINGS: Cardiovascular: Satisfactory opacification of the pulmonary arteries to the segmental level. No evidence of pulmonary embolism. Normal heart size. No pericardial effusion. Mediastinum/Nodes: No enlarged mediastinal, hilar, or axillary lymph nodes. Thyroid gland, trachea, and esophagus demonstrate no significant findings. Lungs/Pleura: Comparison with prior examination accounting for differences in techniques no significant change in the chronic interstitial lung disease, with fibrotic changes particularly involving the left  lung, fibro atelectatic changes of both upper lobes as well as bronchiectatic changes of the bases of the lungs. There is  however, some consolidation within the right lower lobe which correlates with a superimposed pneumonia. There is no suspicious pulmonary nodules. Upper Abdomen: No acute abnormality. Musculoskeletal: No chest wall abnormality. No acute or significant osseous findings. Review of the MIP images confirms the above findings. IMPRESSION: *No evidence of pulmonary embolism. *Chronic interstitial lung disease with fibrotic changes particularly involving the left lung, fibro atelectatic changes of both upper lobes as well as bronchiectatic changes of the bases of the lungs. *There is however, some consolidation within the right lower lobe which correlates with a superimposed pneumonia. Electronically Signed   By: Fredrich Jefferson M.D.   On: 08/16/2023 08:40   DG Chest Port 1 View Result Date: 08/14/2023 EXAM: 1 VIEW XRAY OF THE CHEST 08/14/2023 10:05:58 PM COMPARISON: 06/04/2023 CLINICAL HISTORY: shob. SHOB shob. SHOB FINDINGS: LUNGS AND PLEURA: Progressive patchy right lower lobe opacity, suspicious for pneumonia. Stable scarring / bronchiectasis in the left upper and lower lobes, chronic. No pulmonary edema. No pleural effusion. No pneumothorax. HEART AND MEDIASTINUM: No acute abnormality of the cardiac and mediastinal silhouettes. BONES AND SOFT TISSUES: No acute osseous abnormality. IMPRESSION: 1. Progressive patchy right lower lobe opacity, suspicious for pneumonia. 2. Stable scarring/bronchiectasis in the left upper and lower lobes. Electronically signed by: Zadie Herter Parsons 08/14/2023 10:08 PM EDT RP Workstation: [id number]    Micro Results     Recent Results (from the past 240 hours)  Resp panel by RT-PCR (RSV, Flu A&B, Covid) Anterior Nasal Swab     Status: None   Collection Time: 08/14/23  9:44 PM   Specimen: Anterior Nasal Swab  Result Value Ref Range Status   SARS Coronavirus 2 by RT PCR NEGATIVE NEGATIVE Final   Influenza A by PCR NEGATIVE NEGATIVE Final   Influenza B by PCR NEGATIVE NEGATIVE  Final    Comment: (NOTE) The Xpert Xpress SARS-CoV-2/FLU/RSV plus assay is intended as an aid in the diagnosis of influenza from Nasopharyngeal swab specimens and should not be used as a sole basis for treatment. Nasal washings and aspirates are unacceptable for Xpert Xpress SARS-CoV-2/FLU/RSV testing.  Fact Sheet for Patients: BloggerCourse.com  Fact Sheet for Healthcare Providers: SeriousBroker.it  This test is not yet approved or cleared by the [place]  FDA and has been authorized for detection and/or diagnosis of SARS-CoV-2 by FDA under an Emergency Use Authorization (EUA). This EUA will remain in effect (meaning this test can be used) for the duration of the COVID-19 declaration under Section 564(b)(1) of the Act, 21 U.S.C. section 360bbb-3(b)(1), unless the authorization is terminated or revoked.     Resp Syncytial Virus by PCR NEGATIVE NEGATIVE Final    Comment: (NOTE) Fact Sheet for Patients: BloggerCourse.com  Fact Sheet for Healthcare Providers: SeriousBroker.it  This test is not yet approved or cleared by the [place]  FDA and has been authorized for detection and/or diagnosis of SARS-CoV-2 by FDA under an Emergency Use Authorization (EUA). This EUA will remain in effect (meaning this test can be used) for the duration of the COVID-19 declaration under Section 564(b)(1) of the Act, 21 U.S.C. section 360bbb-3(b)(1), unless the authorization is terminated or revoked.  Performed at [facility], [address] [place], [place] [address]   Culture, blood (routine x 2)     Status: None   Collection Time: 08/14/23 10:00 PM   Specimen: BLOOD  Result Value Ref Range  Status   Specimen Description BLOOD LEFT ARM  Final   Special Requests   Final    BOTTLES DRAWN AEROBIC AND ANAEROBIC Blood Culture results may not be optimal due to an inadequate volume of  blood received in culture bottles   Culture   Final    NO GROWTH 5 DAYS Performed at [facility], [address] [place], [place] [address]    Report Status 08/19/2023 FINAL  Final  Culture, blood (routine x 2)     Status: None   Collection Time: 08/14/23 10:05 PM   Specimen: BLOOD RIGHT HAND  Result Value Ref Range Status   Specimen Description BLOOD RIGHT HAND  Final   Special Requests   Final    BOTTLES DRAWN AEROBIC AND ANAEROBIC Blood Culture results may not be optimal due to an inadequate volume of blood received in culture bottles   Culture   Final    NO GROWTH 5 DAYS Performed at [facility], [address], [place], [place] [address]    Report Status 08/19/2023 FINAL  Final    Today   Subjective    [name] today has no headache,no chest abdominal pain,no new weakness tingling or numbness, feels much better wants to go home today.     Objective   Blood pressure 101/76, pulse 90, temperature 98.5 F (36.9 C), temperature source Oral, resp. rate (!) 22, height 5\' 8"  (1.727 m), weight 84 kg, SpO2 99%.   Intake/Output Summary (Last 24 hours) at 08/21/2023 0753 Last data filed at 08/21/2023 0737 Gross per 24 hour  Intake --  Output 1550 ml  Net -1550 ml    Exam  Awake Alert, No new F.N deficits,    [place].AT,PERRAL Supple Neck,   Symmetrical Chest wall movement, Good air movement bilaterally, CTAB RRR,No Gallops,   +ve B.Sounds, Abd Soft, Non tender,  No Cyanosis, Clubbing or edema    Data Review   Recent Labs  Lab 08/14/23 2144 08/15/23 0010 08/16/23 0430 08/17/23 0431 08/18/23 0435 08/19/23 0436 08/20/23 0607  WBC 9.1   < > 16.3* 9.3 6.9 6.4 5.5  HGB 14.7   < > 12.5* 12.5* 13.3 14.5 15.6  HCT 43.6   < > 37.3* 37.6* 39.7 42.8 47.1  PLT 211   < > 214 209 221 243 293  MCV 94.2   < > 96.1 96.4 94.1 96.2 95.9  MCH 31.7   < > 32.2 32.1 31.5 32.6 31.8  MCHC 33.7   < > 33.5 33.2 33.5 33.9 33.1  RDW 11.3*   < > 11.4* 11.5 11.5 11.6 11.8   LYMPHSABS 1.7  --   --  0.5* 0.6* 0.6* 2.2  MONOABS 0.5  --   --  0.4 0.5 0.5 0.7  EOSABS 0.0  --   --  0.0 0.0 0.0 0.1  BASOSABS 0.0  --   --  0.0 0.0 0.0 0.0   < > = values in this interval not displayed.    Recent Labs  Lab 08/14/23 2144 08/14/23 2158 08/14/23 2159 08/14/23 2217 08/15/23 0010 08/15/23 8295 08/15/23 1437 08/16/23 0430 08/16/23 0855 08/17/23 0431 08/18/23 0435 08/19/23 0436 08/20/23 0607  NA 130*  --   --   --    < > 134*   < > 133*  --  133* 130* 133* 137  K 4.4  --   --   --    < > 5.1   < > 4.4  --  4.5 4.7 4.9 4.1  CL 89*  --   --   --   --  96*   < > 99  --  102 95* 97* 98  CO2 25  --   --   --   --  22   < > 26  --  27 27 27 30   ANIONGAP 16*  --   --   --   --  16*   < > 8  --  4* 8 9 9   GLUCOSE 599*  --   --   --   --  578*   < > 287*  --  254* 345* 196* 61*  BUN 30*  --   --   --   --  32*   < > 32*  --  25* 28* 31* 32*  CREATININE 1.59*  --   --   --   --  1.63*   < > 1.30*  --  1.09 1.17 1.48* 1.38*  AST 15  --   --   --   --   --   --   --   --   --   --   --   --   ALT 18  --   --   --   --   --   --   --   --   --   --   --   --   ALKPHOS 84  --   --   --   --   --   --   --   --   --   --   --   --   BILITOT 1.0  --   --   --   --   --   --   --   --   --   --   --   --   ALBUMIN 3.0*  --   --   --   --   --   --   --   --   --   --   --   --   CRP  --   --   --   --   --   --   --   --  9.1* 4.9* 2.5* 1.3* 0.8  DDIMER  --   --  0.59*  --   --   --   --   --   --   --   --   --   --   PROCALCITON  --   --   --   --   --   --   --   --  <0.10 0.11 <0.10 <0.10 <0.10  LATICACIDVEN  --   --   --  1.4  --   --   --   --   --   --   --   --   --   HGBA1C  --   --   --   --   --  10.8*  --   --   --   --   --   --   --   BNP  --  12.9  --   --   --   --   --   --   --  157.6* 76.9 28.3 5.5  MG  --   --   --   --   --   --   --   --   --  2.0 1.9 2.1 2.0  CALCIUM  10.0  --   --   --   --  9.2   < > 8.9  --  8.8* 9.3 9.8 9.9   < > = values in this  interval not displayed.    Total Time in preparing paper work, data evaluation and todays exam - 35 minutes  Signature  -    Lynnwood Sauer M.D on 08/21/2023 at 7:53 AM   -  To page go to [email or web address]

## 2023-08-20 NOTE — Progress Notes (Signed)
 PHARMACY - ANTICOAGULATION CONSULT NOTE  Pharmacy Consult for Eliquis  Indication: DVT  Allergies  Allergen Reactions   Metformin  And Related Rash    Patient Measurements: Height: 5\' 8"  (172.7 cm) Weight: 84 kg (185 lb 3 oz) IBW/kg (Calculated) : 68.4 HEPARIN  DW (KG): 84  Vital Signs: Temp: 98.4 F (36.9 C) (06/06 1610) Temp Source: Oral (06/06 1610) BP: 112/72 (06/06 1610) Pulse Rate: 97 (06/06 1610)  Labs: Recent Labs    08/18/23 0435 08/19/23 0436 08/20/23 0607  HGB 13.3 14.5 15.6  HCT 39.7 42.8 47.1  PLT 221 243 293  CREATININE 1.17 1.48* 1.38*    Estimated Creatinine Clearance: 66.1 mL/min (A) (by C-G formula based on SCr of 1.38 mg/dL (H)).   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   History of tuberculosis 11/01/2020   HIV disease ([name])    MSSA bacteremia    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)    S/P ORIF (open reduction internal fixation) fracture 03/21/2014   Steroid-induced hyperglycemia 09/05/2021   TB (pulmonary tuberculosis)    previously treated according to refugee documentation    Medications:  Scheduled:   apixaban   5 mg Oral BID   Followed by   Cecily Cohen ON 08/27/2023] apixaban   2.5 mg Oral BID   budesonide -glycopyrrolate -formoterol   1 puff Inhalation BID   Darunavir -Cobicistat -Emtricitabine -Tenofovir   Alafenamide  1 tablet Oral Q breakfast   docusate sodium   100 mg Oral BID   dolutegravir   50 mg Oral Daily   feeding supplement  237 mL Oral BID BM   fluconazole   200 mg Oral Daily   guaiFENesin   600 mg Oral BID   insulin  aspart  0-15 Units Subcutaneous TID WC   insulin  aspart  0-5 Units Subcutaneous QHS   insulin  glargine-yfgn  35 Units Subcutaneous BID   ipratropium-albuterol   3 mL Nebulization BID   isosorbide mononitrate  15 mg Oral Daily   rosuvastatin   20 mg Oral Daily   sulfamethoxazole -trimethoprim   1 tablet Oral Daily    Assessment: 53 yr old male to begin Eliquis  for LLE DVT per duplex.  HIV meds and Fluconazole  interact with Eliquis , and half-dose Eliquis  recommended.  Had been on Lovenox  40 mg SQ q24h for VTE prophylaxis. Last dose 9am today.  CBC stable.  Goal of Therapy:  Appropriate Eliquis  regimen for indication and concurrent medications Monitor platelets by anticoagulation protocol: Yes   Plan:  Discontinue Lovenox . Eliquis  5 mg BID x 1 week, then 2.5 mg BID. Monitor for signs/symptoms of bleeding. Intermittent CBC.  Adolphus Akin, RPh 08/20/2023,4:30 PM

## 2023-08-20 NOTE — Progress Notes (Signed)
 Messaged Dr. Michell Ahumada to find out if she wants pt. to receive scheduled dose of hydralazine. Patient has had soft SBP's; the last 2 being in the 90's and low 100's. Awaiting response.

## 2023-08-20 NOTE — Plan of Care (Signed)
  Problem: Coping: Goal: Ability to adjust to condition or change in health will improve Outcome: Adequate for Discharge   Problem: Fluid Volume: Goal: Ability to maintain a balanced intake and output will improve Outcome: Adequate for Discharge   Problem: Metabolic: Goal: Ability to maintain appropriate glucose levels will improve Outcome: Adequate for Discharge

## 2023-08-21 ENCOUNTER — Other Ambulatory Visit (HOSPITAL_COMMUNITY): Payer: Self-pay

## 2023-08-21 DIAGNOSIS — J189 Pneumonia, unspecified organism: Secondary | ICD-10-CM | POA: Diagnosis not present

## 2023-08-21 LAB — GLUCOSE, CAPILLARY: Glucose-Capillary: 71 mg/dL (ref 70–99)

## 2023-08-21 NOTE — Progress Notes (Signed)
 Patient discharged home. Discharge medication and follow up discussed with patient. Verbalized understanding, Written instructions given. Eliquis  card and return to work slip given.

## 2023-08-21 NOTE — Progress Notes (Signed)
 [facility]                                                                                                                                                                           Patient Demographics   [name], is a 53 y.o. male   LKG:[id number]   GUY:[id number]   DOB - [birth year]   Admit date - 08/14/2023   Admitting Physician Mauricio Susann Eon, MD   Outpatient Primary MD for the patient is Alexander-Savino, [place], [place]   LOS - 6        Chief Complaint  Patient presents with   Shortness of Breath          Assessment & Plan      Patient seen briefly today due for discharge soon per Discharge done yesterday by Dr me, no further issues, Vital signs stable, patient feels fine.  Eliquis  has been added for left lower extremity age-indeterminate DVT.       Medications   Scheduled Meds:  apixaban   5 mg Oral BID    Followed by   Cecily Cohen ON 08/27/2023] apixaban   2.5 mg Oral BID   budesonide -glycopyrrolate -formoterol   1 puff Inhalation BID   Darunavir -Cobicistat -Emtricitabine -Tenofovir  Alafenamide  1 tablet Oral Q breakfast   docusate sodium   100 mg Oral BID   dolutegravir   50 mg Oral Daily   feeding supplement  237 mL Oral BID BM   fluconazole   200 mg Oral Daily   guaiFENesin   600 mg Oral BID   insulin  aspart  0-15 Units Subcutaneous TID WC   insulin  aspart  0-5 Units Subcutaneous QHS   insulin  glargine-yfgn  35 Units Subcutaneous BID   isosorbide  mononitrate  15 mg Oral Daily   rosuvastatin   20 mg Oral Daily   sulfamethoxazole -trimethoprim   1 tablet Oral Daily        Continuous Infusions:      PRN Meds:. acetaminophen  **OR** acetaminophen , albuterol , benzonatate , ondansetron  **OR** ondansetron  (ZOFRAN ) IV           Time Spent in minutes   10 minutes     Lynnwood Sauer M.D on 08/21/2023 at 7:54 AM   Between 7am to 7pm - Pager - [phone]   After 7pm go to [email or web address] - password [email or web address]   And look for the night coverage person  covering for me after hours   [facility] Office  [phone]     Subjective:    [name] today has, No headache, No chest pain, No abdominal pain - No Nausea, No new weakness tingling or numbness, No Cough - SOB.    Objective:          Vitals:    08/20/23 2027 08/20/23 2030 08/21/23 0100 08/21/23 0720  BP:     113/84    Pulse:          Resp:          Temp:     98.1 F (36.7 C)    TempSrc:     Oral    SpO2: 95% 97%   95%  Weight:          Height:                 Wt Readings from Last 3 Encounters:  08/14/23 84 kg  06/09/23 84.1 kg  06/04/23 83 kg        Intake/Output Summary (Last 24 hours) at 08/21/2023 0754 Last data filed at 08/21/2023 0737    Gross per 24 hour  Intake --  Output 1550 ml  Net -1550 ml      Exam   Awake Alert, No new F.N deficits, Normal affect [place].AT,PERRAL Supple Neck, No JVD,   Symmetrical Chest wall movement, Good air movement bilaterally, CTAB RRR,No Gallops, Rubs or new Murmurs,  +ve B.Sounds, Abd Soft, No tenderness,   No Cyanosis, Clubbing or edema    Data Review

## 2023-08-21 NOTE — TOC Transition Note (Signed)
 Transition of Care [facility]) - Discharge Note   Patient Details  Name: [name] MRN: [id number] Date of Birth: [birth year]  [facility]) CM/SW Contact:  Omie Bickers, RN Phone Number: 08/21/2023, 9:56 AM   Clinical Narrative:     Meds provided through [facility] pharmacy for DC.   Patient provided with copay reduction card, nurse to review with interpreter during DC instructions  Final next level of care: Home/Self Care Barriers to Discharge: No Barriers Identified   Patient Goals and CMS Choice            Discharge Placement                       Discharge Plan and Services Additional resources added to the After Visit Summary for                                       Social Drivers of Health (SDOH) Interventions SDOH Screenings   Food Insecurity: Food Insecurity Present (08/15/2023)  Housing: High Risk (08/15/2023)  Transportation Needs: Unmet Transportation Needs (08/15/2023)  Utilities: Not At Risk (08/15/2023)  Depression (PHQ2-9): Low Risk  (06/09/2023)  Social Connections: Socially Integrated (08/15/2023)  Tobacco Use: Medium Risk (08/15/2023)     Readmission Risk Interventions    04/09/2023   12:37 PM 08/21/2022    3:13 PM 07/02/2022   12:34 PM  Readmission Risk Prevention Plan  Post Dischage Appt   Complete  Medication Screening   Complete  Transportation Screening Complete Complete Complete  PCP or Specialist Appt within 5-7 Days Complete Complete   Home Care Screening Complete Complete   Medication Review (RN CM) Complete Complete

## 2023-08-21 NOTE — Plan of Care (Signed)
 [facility]                                                                                                                                                                         Patient Demographics  Amro Winebarger, is a 53 y.o. male  RUE:[id number]  JYN:[id number]  DOB - [birth year]  Admit date - 08/14/2023  Admitting Physician Mauricio Susann Eon, MD  Outpatient Primary MD for the patient is Alexander-Savino, [place], MD  LOS - 6   Chief Complaint  Patient presents with   Shortness of Breath        Assessment & Plan    Patient seen briefly today due for discharge soon per Discharge done yesterday by Dr me, no further issues, Vital signs stable, patient feels fine.  Eliquis  has been added for left lower extremity age-indeterminate DVT.    Medications  Scheduled Meds:  apixaban   5 mg Oral BID   Followed by   Cecily Cohen ON 08/27/2023] apixaban   2.5 mg Oral BID   budesonide -glycopyrrolate -formoterol   1 puff Inhalation BID   Darunavir -Cobicistat -Emtricitabine -Tenofovir  Alafenamide  1 tablet Oral Q breakfast   docusate sodium   100 mg Oral BID   dolutegravir   50 mg Oral Daily   feeding supplement  237 mL Oral BID BM   fluconazole   200 mg Oral Daily   guaiFENesin   600 mg Oral BID   insulin  aspart  0-15 Units Subcutaneous TID WC   insulin  aspart  0-5 Units Subcutaneous QHS   insulin  glargine-yfgn  35 Units Subcutaneous BID   isosorbide  mononitrate  15 mg Oral Daily   rosuvastatin   20 mg Oral Daily   sulfamethoxazole -trimethoprim   1 tablet Oral Daily   Continuous Infusions: PRN Meds:.acetaminophen  **OR** acetaminophen , albuterol , benzonatate , ondansetron  **OR** ondansetron  (ZOFRAN ) IV    Time Spent in minutes   10 minutes   Lynnwood Sauer M.D on 08/21/2023 at 7:54 AM  Between 7am to 7pm - Pager - [phone]  After 7pm go to [email or web address] - password [email or web address]  And look for the night  coverage person covering for me after hours  [facility] Office  [phone]    Subjective:   Elisa Sorlie today has, No headache, No chest pain, No abdominal pain - No Nausea, No new weakness tingling or numbness, No Cough - SOB.   Objective:   Vitals:   08/20/23 2027 08/20/23 2030 08/21/23 0100 08/21/23 0720  BP:   113/84   Pulse:      Resp:      Temp:   98.1 F (36.7 C)   TempSrc:   Oral   SpO2: 95% 97%  95%  Weight:      Height:        Wt Readings from Last  3 Encounters:  08/14/23 84 kg  06/09/23 84.1 kg  06/04/23 83 kg     Intake/Output Summary (Last 24 hours) at 08/21/2023 0754 Last data filed at 08/21/2023 0737 Gross per 24 hour  Intake --  Output 1550 ml  Net -1550 ml    Exam  Awake Alert, No new F.N deficits, Normal affect [place].AT,PERRAL Supple Neck, No JVD,   Symmetrical Chest wall movement, Good air movement bilaterally, CTAB RRR,No Gallops, Rubs or new Murmurs,  +ve B.Sounds, Abd Soft, No tenderness,   No Cyanosis, Clubbing or edema   Data Review

## 2023-08-25 ENCOUNTER — Other Ambulatory Visit: Payer: Self-pay

## 2023-08-30 ENCOUNTER — Encounter: Admitting: Student

## 2023-08-30 ENCOUNTER — Other Ambulatory Visit: Payer: Self-pay | Admitting: Student

## 2023-08-30 DIAGNOSIS — E1169 Type 2 diabetes mellitus with other specified complication: Secondary | ICD-10-CM

## 2023-08-30 NOTE — Progress Notes (Signed)
 Saw patient this morning to sign informed consent.  He also reported that he needed help with insulin  administration.  I taught him how to do this.  I used a interpreter.  Interpreter's name is Liam Redhead and his interpreter ID is [phone].  Informed consent is obtained and scanned into chart.  Will send a referral for diabetes educator.

## 2023-09-06 ENCOUNTER — Ambulatory Visit: Payer: Managed Care, Other (non HMO) | Admitting: Dermatology

## 2023-09-09 ENCOUNTER — Encounter: Admitting: Student

## 2023-09-23 ENCOUNTER — Telehealth: Payer: Self-pay | Admitting: Dietician

## 2023-09-23 ENCOUNTER — Encounter: Admitting: Dietician

## 2023-09-23 NOTE — Telephone Encounter (Signed)
 Call to follow up on how patient is doing with taking insulin . Interpreter [phone] left him a message to call the office.

## 2023-09-30 ENCOUNTER — Other Ambulatory Visit: Payer: Self-pay

## 2023-09-30 ENCOUNTER — Encounter: Payer: Self-pay | Admitting: Internal Medicine

## 2023-09-30 ENCOUNTER — Other Ambulatory Visit (HOSPITAL_COMMUNITY): Payer: Self-pay

## 2023-09-30 ENCOUNTER — Telehealth: Payer: Self-pay

## 2023-09-30 ENCOUNTER — Other Ambulatory Visit: Payer: Self-pay | Admitting: Pharmacist

## 2023-09-30 ENCOUNTER — Ambulatory Visit: Admitting: Internal Medicine

## 2023-09-30 VITALS — BP 127/84 | HR 95 | Temp 98.0°F | Ht 68.0 in | Wt 183.0 lb

## 2023-09-30 DIAGNOSIS — B20 Human immunodeficiency virus [HIV] disease: Secondary | ICD-10-CM

## 2023-09-30 DIAGNOSIS — J449 Chronic obstructive pulmonary disease, unspecified: Secondary | ICD-10-CM | POA: Diagnosis not present

## 2023-09-30 DIAGNOSIS — E1165 Type 2 diabetes mellitus with hyperglycemia: Secondary | ICD-10-CM | POA: Diagnosis not present

## 2023-09-30 DIAGNOSIS — Z794 Long term (current) use of insulin: Secondary | ICD-10-CM | POA: Diagnosis not present

## 2023-09-30 DIAGNOSIS — Z79899 Other long term (current) drug therapy: Secondary | ICD-10-CM

## 2023-09-30 MED ORDER — TIVICAY 50 MG PO TABS: 50.0000 mg | ORAL_TABLET | Freq: Every day | ORAL | Status: DC

## 2023-09-30 MED ORDER — SULFAMETHOXAZOLE-TRIMETHOPRIM 800-160 MG PO TABS: 1.0000 | ORAL_TABLET | Freq: Every day | ORAL | 5 refills | Status: DC

## 2023-09-30 MED ORDER — SYMTUZA 800-150-200-10 MG PO TABS: 1.0000 | ORAL_TABLET | Freq: Every day | ORAL | 11 refills | Status: DC

## 2023-09-30 MED ORDER — ALBUTEROL SULFATE HFA 108 (90 BASE) MCG/ACT IN AERS: 2.0000 | INHALATION_SPRAY | Freq: Four times a day (QID) | RESPIRATORY_TRACT | 6 refills | Status: DC | PRN

## 2023-09-30 NOTE — Progress Notes (Addendum)
 Patient followed with Dr. Luiz today after recent hospitalization. Reviewed appropriate insulin  pen administration technique today with interpreter. Patient was not removing the needle cap before administering.   Patient also must fill Symtuza  and Tivicay  with [facility] and [facility] due to updated insurance. Confirmed pharmacy still has authorization for our clinic to refill medications and have them sent to our clinic. Provided patient with 30 days of Symtuza  and Tivicay  donations today. Unable to provide Bactrim  in office. [facility] on [address] where Dr. Luiz sent albulterol.   Spoke with [facility] team today to schedule delivery of Symtuza  and Tivicay  to our office. Provided verbal order for both with 11 refills to ensure timely filling.   Alan Geralds, PharmD, CPP, BCIDP, AAHIVP Clinical Pharmacist Practitioner Infectious Diseases Clinical Pharmacist [facility]

## 2023-09-30 NOTE — Telephone Encounter (Signed)
 Man, I wish they would have mentioned their concerns to me when I was on the phone with them for 45 minutes....  Can continue Eliquis  and Crestor  as they are since we already are aware of this drug interaction. Crestor  is appropriate at maximum dose of 20 mg with Symtuza . Eliquis  has already been dose adjusted to 2.5 mg twice daily which is also appropriate.  Patient has not filled Breztri  since February. Although it is best to avoid budesonide  with darunavir , it can be continued at the lowest dose and provider can monitor for steroid toxicity. Alternatives include beclomethasone which would require PA and be quite expensive I imagine.

## 2023-09-30 NOTE — Telephone Encounter (Signed)
 Spoke with Lupita, [facility] at [facility]. Relayed per Alan Geralds, RPH that okay to continue with Eliquis  and Crestor  at current dose as these have already been dose-adjusted.   Relayed that while it's best to avoid budesonide  with darunavir  that it can be continued at lowest dose and patient's provider can monitor for signs of steroid toxicity. Relayed that on our end it looks like this has not been filled since February. Asked Sweta to reach out to provider prescribing Breztri  to consider alternative. They do not fill Breztri  for the patient, but they will reach out to patient to counsel on DDI between Breztri  and Symtuza .   Morganne Haile, BSN, RN

## 2023-09-30 NOTE — Telephone Encounter (Signed)
 Received call from [facility] to report DDI between Symtuza , Eliquis , Breztri , and Crestor . They also state that Symtuza  can increase patient's blood glucose, worsening his diabetes.   Call back [phone]  Duwaine Lowe, BSN, RN

## 2023-09-30 NOTE — Progress Notes (Signed)
 RFV: follow up for hiv disease  Patient ID: [name], male   DOB: 01-Feb-1971, 53 y.o.   MRN: [id number]  HPI [name] is a 53yo M originally from [place], swahili speaking here with translator. Currently on tivicay  -symtuza . He was hospitalized in the beginning of June for pneumonia. Discharged on steroid taper. He states his respiratory symptoms are improved. He was discharge on insulin  for which he has many questions on how to use. In addition, he states that  [facility] has been giving his medications in different schedule - has not gone well.  Outpatient Encounter Medications as of 09/30/2023  Medication Sig   albuterol  (VENTOLIN  HFA) 108 (90 Base) MCG/ACT inhaler Inhale 2 puffs into the lungs every 6 (six) hours as needed for wheezing or shortness of breath.   apixaban  (ELIQUIS ) 2.5 MG TABS tablet Take 2 tablets (5 mg total) by mouth 2 (two) times daily for 7 days, THEN 1 tablet (2.5 mg total) 2 (two) times daily.   Ascorbic Acid (VITAMIN C PO) Take 1 tablet by mouth daily.   benzonatate  (TESSALON  PERLES) 100 MG capsule Take 1 capsule (100 mg total) by mouth 3 (three) times daily as needed for cough.   Blood Glucose Monitoring Suppl (BLOOD GLUCOSE MONITOR SYSTEM) w/Device KIT Use as directed in the morning, at noon, and at bedtime.   calcium  carbonate (TUMS - DOSED IN MG ELEMENTAL CALCIUM ) 500 MG chewable tablet Chew 1 tablet by mouth as needed for indigestion or heartburn.   Darunavir -Cobicistat -Emtricitabine -Tenofovir  Alafenamide (SYMTUZA ) 800-150-200-10 MG TABS Take 1 tablet by mouth daily with breakfast.   dolutegravir  (TIVICAY ) 50 MG tablet Take 1 tablet (50 mg total) by mouth daily.   fluconazole  (DIFLUCAN ) 200 MG tablet Take 1 tablet (200 mg total) by mouth daily.   insulin  glargine (LANTUS ) 100 UNIT/ML Solostar Pen Inject 36 Units into the skin at bedtime.Ingiza vitengo 36 wakati wa kulala   insulin  lispro (HUMALOG  KWIKPEN) 100 UNIT/ML KwikPen Inject into the skin before  each meal 3 times a day using the following sliding scale, 140-199 - 2 units, 200-250 - 4 units, 251-299 - 6 units,  300-349 - 8 units,  350 or above 10 units. Ingiza ndani ya ngozi kabla ya kila mlo mara 3 kwa siku kwa kutumia mizani ifuatayo ya kuteleza, vitengo 140-199 - 2, vitengo 200-250 - 4, vitengo 251-299 - 6,  300-349 - 8, 350 au zaidi ya vitengo 10.   Insulin  Pen Needle 32G X 4 MM MISC Use 4 times a day   isosorbide  mononitrate (IMDUR ) 30 MG 24 hr tablet Take 0.5 tablets (15 mg total) by mouth daily.   Multiple Vitamins-Minerals (MULTIVITAMIN ADULTS PO) Take 1 each by mouth daily.   rosuvastatin  (CRESTOR ) 20 MG tablet Take 1 tablet (20 mg total) by mouth daily.   sitaGLIPtin -metformin  (JANUMET ) 50-500 MG tablet Take 1 tablet by mouth 2 (two) times daily with a meal.   sulfamethoxazole -trimethoprim  (BACTRIM  DS) 800-160 MG tablet Take 1 tablet by mouth daily.   No facility-administered encounter medications on file as of 09/30/2023.     Patient Active Problem List   Diagnosis Date Noted   Respiratory failure ([name]) 08/15/2023   CKD stage 3a, GFR 45-59 ml/min ([name]) 08/15/2023   Acute on chronic hypoxic respiratory failure ([name]) 06/04/2023   Heart failure with recovered ejection fraction (HFrecEF) ([name]) 06/04/2023   COPD exacerbation ([name]) 06/04/2023   COPD (chronic obstructive pulmonary disease) ([name]) 04/09/2023   Pneumonia due to infectious organism 04/08/2023   Acute hypoxic  respiratory failure ([name]) 04/08/2023   CAP (community acquired pneumonia) 04/07/2023   Hypertension associated with diabetes ([name]) 03/22/2023   Health care maintenance 03/22/2023   Adenopathy 12/07/2021   Atopic dermatitis 09/30/2021   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   Bronchiectasis ([name]) 04/22/2021   S/P colostomy ([name]) 02/06/2021   Type 2 diabetes mellitus with hyperlipidemia ([name]) 05/17/2019   Genital warts 05/06/2018   Pulmonary emphysema ([name])    Condyloma  01/04/2017   Chronic systolic CHF (congestive heart failure) ([name]) 04/24/2016   AIDS ([name])    HIV disease ([name]) 09/18/2013     Health Maintenance Due  Topic Date Due   OPHTHALMOLOGY EXAM  Never done   Zoster Vaccines- Shingrix (1 of 2) Never done   Hepatitis B Vaccines (3 of 3 - 19+ 3-dose series) 04/11/2014   COVID-19 Vaccine (3 - Pfizer risk series) 03/15/2023   Diabetic kidney evaluation - Urine ACR  10/08/2023     Review of Systems 12 point ros is otherwise negative Physical Exam  BP 127/84   Pulse 95   Temp 98 F (36.7 C) (Temporal)   Ht 5' 8 (1.727 m)   Wt 183 lb (83 kg)   SpO2 90%   BMI 27.83 kg/m  Physical Exam  Constitutional: He is oriented to person, place, and time. He appears well-developed and well-nourished. No distress.  HENT:  Mouth/Throat: Oropharynx is clear and moist. No oropharyngeal exudate.  Cardiovascular: Normal rate, regular rhythm and normal heart sounds. Exam reveals no gallop and no friction rub.  No murmur heard.  Pulmonary/Chest: Effort normal and breath sounds normal. No respiratory distress. He has no wheezes.  Abdominal: Soft. Bowel sounds are normal. He exhibits no distension. There is no tenderness.  Lymphadenopathy:  He has no cervical adenopathy.  Neurological: He is alert and oriented to person, place, and time.  Skin: Skin is warm and dry. No rash noted. No erythema.  Psychiatric: He has a normal mood and affect. His behavior is normal.    Lab Results  Component Value Date   CD4TCELL 14 (L) 08/16/2023   Lab Results  Component Value Date   CD4TABS 58 (L) 08/16/2023   CD4TABS 87 (L) 02/15/2023   CD4TABS 93 (L) 06/23/2022   Lab Results  Component Value Date   HIV1RNAQUANT <20 08/16/2023   No results found for: HEPBSAB Lab Results  Component Value Date   LABRPR NON-REACTIVE 02/15/2023    CBC Lab Results  Component Value Date   WBC 5.5 08/20/2023   RBC 4.91 08/20/2023   HGB 15.6 08/20/2023   HCT 47.1 08/20/2023    PLT 293 08/20/2023   MCV 95.9 08/20/2023   MCH 31.8 08/20/2023   MCHC 33.1 08/20/2023   RDW 11.8 08/20/2023   LYMPHSABS 2.2 08/20/2023   MONOABS 0.7 08/20/2023   EOSABS 0.1 08/20/2023    BMET Lab Results  Component Value Date   NA 137 08/20/2023   K 4.1 08/20/2023   CL 98 08/20/2023   CO2 30 08/20/2023   GLUCOSE 61 (L) 08/20/2023   BUN 32 (H) 08/20/2023   CREATININE 1.38 (H) 08/20/2023   CALCIUM  9.9 08/20/2023   GFRNONAA >60 08/20/2023   GFRAA 79 12/04/2019      Assessment and Plan  Hiv disease = will check cd 4 count and viral load and refill on tivicay ,symtuza - [facility] and patient can pick up his hiv meds here  [facility]  porph = continue on bactrim   Long term medicaiton management  = will check cr  Newly diagnosed type 2 DM, IDDM = patient does not know how to do his insulin  injections. The majority of visit teaching difference between his long acting and his meal time insulin  How to self injection COPD = refilled albuterol  and discontinued symbicort  as it has drug interactions with symtuza 

## 2023-09-30 NOTE — Telephone Encounter (Signed)
No thank YOU!

## 2023-10-01 ENCOUNTER — Ambulatory Visit: Admitting: Internal Medicine

## 2023-10-01 LAB — T-HELPER CELL (CD4) - (RCID CLINIC ONLY)
CD4 % Helper T Cell: 9 % — ABNORMAL LOW (ref 33–65)
CD4 T Cell Abs: 100 /uL — ABNORMAL LOW (ref 400–1790)

## 2023-10-03 LAB — HIV-1 RNA QUANT-NO REFLEX-BLD
HIV 1 RNA Quant: 31 {copies}/mL — ABNORMAL HIGH
HIV-1 RNA Quant, Log: 1.49 {Log_copies}/mL — ABNORMAL HIGH

## 2023-10-03 LAB — COMPREHENSIVE METABOLIC PANEL WITH GFR
AG Ratio: 1.2 (calc) (ref 1.0–2.5)
ALT: 14 U/L (ref 9–46)
AST: 17 U/L (ref 10–35)
Albumin: 3.9 g/dL (ref 3.6–5.1)
Alkaline phosphatase (APISO): 66 U/L (ref 35–144)
BUN: 15 mg/dL (ref 7–25)
CO2: 26 mmol/L (ref 20–32)
Calcium: 9.4 mg/dL (ref 8.6–10.3)
Chloride: 104 mmol/L (ref 98–110)
Creat: 0.98 mg/dL (ref 0.70–1.30)
Globulin: 3.3 g/dL (ref 1.9–3.7)
Glucose, Bld: 142 mg/dL — ABNORMAL HIGH (ref 65–99)
Potassium: 4.1 mmol/L (ref 3.5–5.3)
Sodium: 139 mmol/L (ref 135–146)
Total Bilirubin: 0.5 mg/dL (ref 0.2–1.2)
Total Protein: 7.2 g/dL (ref 6.1–8.1)
eGFR: 93 mL/min/1.73m2 (ref >=60)

## 2023-10-03 LAB — HEMOGLOBIN A1C
Hgb A1c MFr Bld: 8.5 % — ABNORMAL HIGH (ref <5.7)
Mean Plasma Glucose: 197 mg/dL
eAG (mmol/L): 10.9 mmol/L

## 2023-10-21 ENCOUNTER — Other Ambulatory Visit: Payer: Self-pay | Admitting: Pharmacist

## 2023-10-21 ENCOUNTER — Other Ambulatory Visit (HOSPITAL_COMMUNITY): Payer: Self-pay

## 2023-10-21 DIAGNOSIS — B20 Human immunodeficiency virus [HIV] disease: Secondary | ICD-10-CM

## 2023-10-21 MED ORDER — SYMTUZA 800-150-200-10 MG PO TABS: 1.0000 | ORAL_TABLET | Freq: Every day | ORAL | 11 refills | Status: DC

## 2023-10-21 MED ORDER — TIVICAY 50 MG PO TABS
50.0000 mg | ORAL_TABLET | Freq: Every day | ORAL | 11 refills | Status: DC
Start: 2023-10-21 — End: 2023-12-15

## 2023-10-21 MED ORDER — SULFAMETHOXAZOLE-TRIMETHOPRIM 800-160 MG PO TABS: 1.0000 | ORAL_TABLET | Freq: Every day | ORAL | 5 refills | Status: DC

## 2023-10-21 NOTE — Progress Notes (Signed)
 Patient came to clinic today asking for medication. [name] spoke with [facility] who states they are no longer contracted with his insurance even though they had set up his delivery with me at the end of June with everything clear through his insurance. [facility] had called the patient even though they are aware of language barrier stating the patient needed to have his medication transferred to [facility] pharmacy. Will resend Symtuza , Tivicay , and Bactrim  to [facility] pharmacy. [name] also found additional 30 day supplies of Symtuza  and Tivicay  donated samples for him.  Alan Geralds, PharmD, CPP, BCIDP, AAHIVP Clinical Pharmacist Practitioner Infectious Diseases Clinical Pharmacist [facility]

## 2023-10-27 ENCOUNTER — Other Ambulatory Visit: Payer: Self-pay | Admitting: Pharmacist

## 2023-10-27 DIAGNOSIS — B20 Human immunodeficiency virus [HIV] disease: Secondary | ICD-10-CM

## 2023-10-27 MED ORDER — SYMTUZA 800-150-200-10 MG PO TABS: 1.0000 | ORAL_TABLET | Freq: Every day | ORAL | Status: AC

## 2023-10-27 NOTE — Progress Notes (Signed)
 Medication Samples have been provided to the patient.  Drug name: Symtuza         Strength: 800/150/200/10 mg Qty: 30  Tablets (1 bottles) LOT: [id number]   Exp.Date: 9/25  Samples requested by Alan Geralds, PharmD - provided with 30 days of Tivicay  as well.  Dosing instructions: Take one tablet by mouth once daily with food  The patient has been instructed regarding the correct time, dose, and frequency of taking this medication, including desired effects and most common side effects.   Alan Geralds, PharmD, CPP, BCIDP, AAHIVP Clinical Pharmacist Practitioner Infectious Diseases Clinical Pharmacist [facility]

## 2023-11-19 ENCOUNTER — Other Ambulatory Visit: Payer: Self-pay

## 2023-11-19 MED ORDER — ALBUTEROL SULFATE HFA 108 (90 BASE) MCG/ACT IN AERS: 2.0000 | INHALATION_SPRAY | Freq: Four times a day (QID) | RESPIRATORY_TRACT | 0 refills | Status: DC | PRN

## 2023-12-01 ENCOUNTER — Ambulatory Visit: Admitting: Internal Medicine

## 2023-12-01 ENCOUNTER — Telehealth: Payer: Self-pay

## 2023-12-01 NOTE — Telephone Encounter (Signed)
 [name] missed today's appt with Dr. Luiz. I called with an interpreter to reschedule the appt. VM was left requesting a callback.

## 2023-12-06 ENCOUNTER — Other Ambulatory Visit (HOSPITAL_COMMUNITY): Payer: Self-pay

## 2023-12-06 ENCOUNTER — Encounter: Payer: Self-pay | Admitting: Internal Medicine

## 2023-12-06 ENCOUNTER — Ambulatory Visit (INDEPENDENT_AMBULATORY_CARE_PROVIDER_SITE_OTHER): Admitting: Internal Medicine

## 2023-12-06 ENCOUNTER — Other Ambulatory Visit: Payer: Self-pay

## 2023-12-06 VITALS — BP 128/84 | HR 96 | Temp 98.2°F | Wt 196.0 lb

## 2023-12-06 DIAGNOSIS — J449 Chronic obstructive pulmonary disease, unspecified: Secondary | ICD-10-CM | POA: Diagnosis not present

## 2023-12-06 DIAGNOSIS — B20 Human immunodeficiency virus [HIV] disease: Secondary | ICD-10-CM

## 2023-12-06 DIAGNOSIS — Z79899 Other long term (current) drug therapy: Secondary | ICD-10-CM

## 2023-12-06 DIAGNOSIS — Z0000 Encounter for general adult medical examination without abnormal findings: Secondary | ICD-10-CM

## 2023-12-06 MED ORDER — ALBUTEROL SULFATE HFA 108 (90 BASE) MCG/ACT IN AERS: 2.0000 | INHALATION_SPRAY | Freq: Four times a day (QID) | RESPIRATORY_TRACT | 3 refills | Status: DC | PRN

## 2023-12-06 NOTE — Progress Notes (Unsigned)
 RFV: follow up for hiv disease  Patient ID: [name], male   DOB: 09/16/1970, 53 y.o.   MRN: [id number]  HPI [name] is a 53yo M with advanced hiv disease, CD 4 count of 100/VL 31 (July 2025) on tivicay  and symtuza . Also on bactrim  for oi proph. He states that he  Has been at current job 5 yr. Concern that he is going to get points for missing work to go to [employer]. I get points everytime I come to clinic appt. I can't come tomorrow's appt  he says through interpreter.   He reports that his health is otherwise doing well. He has underlying COPD, for which he is on albuterol  and reports that he needs refill. Denies excess shortness of breath  Outpatient Encounter Medications as of 12/06/2023  Medication Sig   albuterol  (VENTOLIN  HFA) 108 (90 Base) MCG/ACT inhaler Inhale 2 puffs into the lungs every 6 (six) hours as needed for wheezing or shortness of breath.   albuterol  (VENTOLIN  HFA) 108 (90 Base) MCG/ACT inhaler Inhale 2 puffs into the lungs every 6 (six) hours as needed for wheezing or shortness of breath.   Ascorbic Acid (VITAMIN C PO) Take 1 tablet by mouth daily.   benzonatate  (TESSALON  PERLES) 100 MG capsule Take 1 capsule (100 mg total) by mouth 3 (three) times daily as needed for cough.   Blood Glucose Monitoring Suppl (BLOOD GLUCOSE MONITOR SYSTEM) w/Device KIT Use as directed in the morning, at noon, and at bedtime.   calcium  carbonate (TUMS - DOSED IN MG ELEMENTAL CALCIUM ) 500 MG chewable tablet Chew 1 tablet by mouth as needed for indigestion or heartburn.   Darunavir -Cobicistat -Emtricitabine -Tenofovir  Alafenamide (SYMTUZA ) 800-150-200-10 MG TABS Take 1 tablet by mouth daily with breakfast.   dolutegravir  (TIVICAY ) 50 MG tablet Take 1 tablet (50 mg total) by mouth daily.   fluconazole  (DIFLUCAN ) 200 MG tablet Take 1 tablet (200 mg total) by mouth daily.   insulin  glargine (LANTUS ) 100 UNIT/ML Solostar Pen Inject 36 Units into the skin at bedtime.Ingiza vitengo  36 wakati wa kulala   insulin  lispro (HUMALOG  KWIKPEN) 100 UNIT/ML KwikPen Inject into the skin before each meal 3 times a day using the following sliding scale, 140-199 - 2 units, 200-250 - 4 units, 251-299 - 6 units,  300-349 - 8 units,  350 or above 10 units. Ingiza ndani ya ngozi kabla ya kila mlo mara 3 kwa siku kwa kutumia mizani ifuatayo ya kuteleza, vitengo 140-199 - 2, vitengo 200-250 - 4, vitengo 251-299 - 6,  300-349 - 8, 350 au zaidi ya vitengo 10.   Insulin  Pen Needle 32G X 4 MM MISC Use 4 times a day   isosorbide  mononitrate (IMDUR ) 30 MG 24 hr tablet Take 0.5 tablets (15 mg total) by mouth daily.   Multiple Vitamins-Minerals (MULTIVITAMIN ADULTS PO) Take 1 each by mouth daily.   rosuvastatin  (CRESTOR ) 20 MG tablet Take 1 tablet (20 mg total) by mouth daily.   sitaGLIPtin -metformin  (JANUMET ) 50-500 MG tablet Take 1 tablet by mouth 2 (two) times daily with a meal.   sulfamethoxazole -trimethoprim  (BACTRIM  DS) 800-160 MG tablet Take 1 tablet by mouth daily.   apixaban  (ELIQUIS ) 2.5 MG TABS tablet Take 2 tablets (5 mg total) by mouth 2 (two) times daily for 7 days, THEN 1 tablet (2.5 mg total) 2 (two) times daily.   No facility-administered encounter medications on file as of 12/06/2023.     Patient Active Problem List   Diagnosis Date Noted   Respiratory failure (  [name]) 08/15/2023   CKD stage 3a, GFR 45-59 ml/min ([name]) 08/15/2023   Acute on chronic hypoxic respiratory failure ([name]) 06/04/2023   Heart failure with recovered ejection fraction (HFrecEF) ([name]) 06/04/2023   COPD exacerbation ([name]) 06/04/2023   COPD (chronic obstructive pulmonary disease) ([name]) 04/09/2023   Pneumonia due to infectious organism 04/08/2023   Acute hypoxic respiratory failure ([name]) 04/08/2023   CAP (community acquired pneumonia) 04/07/2023   Hypertension associated with diabetes ([name]) 03/22/2023   Health care maintenance 03/22/2023   Adenopathy 12/07/2021   Atopic dermatitis 09/30/2021   COPD (chronic  obstructive pulmonary disease) with emphysema ([name]) 08/23/2021   Bronchiectasis ([name]) 04/22/2021   S/P colostomy ([name]) 02/06/2021   Type 2 diabetes mellitus with hyperlipidemia ([name]) 05/17/2019   Genital warts 05/06/2018   Pulmonary emphysema ([name])    Condyloma 01/04/2017   Chronic systolic CHF (congestive heart failure) ([name]) 04/24/2016   AIDS ([name])    HIV disease ([name]) 09/18/2013     Health Maintenance Due  Topic Date Due   OPHTHALMOLOGY EXAM  Never done   Zoster Vaccines- Shingrix (1 of 2) Never done   Hepatitis B Vaccines 19-59 Average Risk (3 of 3 - 19+ 3-dose series) 04/11/2014   COVID-19 Vaccine (3 - Pfizer risk series) 03/15/2023   Diabetic kidney evaluation - Urine ACR  10/08/2023   Influenza Vaccine  10/15/2023     Review of Systems 12 point ros is otherwise negative Physical Exam   BP 128/84   Pulse 96   Temp 98.2 F (36.8 C) (Oral)   Wt 196 lb (88.9 kg)   BMI 29.80 kg/m   Physical Exam  Constitutional: He is oriented to person, place, and time. He appears well-developed and well-nourished. No distress.  HENT:  Mouth/Throat: Oropharynx is clear and moist. No oropharyngeal exudate.  Cardiovascular: Normal rate, regular rhythm and normal heart sounds. Exam reveals no gallop and no friction rub.  No murmur heard.  Pulmonary/Chest: Effort normal and breath sounds normal. No respiratory distress. He has no wheezes.  Abdominal: Soft. Bowel sounds are normal. He exhibits no distension. There is no tenderness.  Lymphadenopathy:  He has no cervical adenopathy.  Neurological: He is alert and oriented to person, place, and time.  Skin: Skin is warm and dry. No rash noted. No erythema.  Psychiatric: He has a normal mood and affect. His behavior is normal.   Lab Results  Component Value Date   CD4TCELL 9 (L) 09/30/2023   Lab Results  Component Value Date   CD4TABS 100 (L) 09/30/2023   CD4TABS 58 (L) 08/16/2023   CD4TABS 87 (L) 02/15/2023   Lab Results   Component Value Date   HIV1RNAQUANT 31 (H) 09/30/2023   No results found for: HEPBSAB Lab Results  Component Value Date   LABRPR NON-REACTIVE 02/15/2023    CBC Lab Results  Component Value Date   WBC 5.5 08/20/2023   RBC 4.91 08/20/2023   HGB 15.6 08/20/2023   HCT 47.1 08/20/2023   PLT 293 08/20/2023   MCV 95.9 08/20/2023   MCH 31.8 08/20/2023   MCHC 33.1 08/20/2023   RDW 11.8 08/20/2023   LYMPHSABS 2.2 08/20/2023   MONOABS 0.7 08/20/2023   EOSABS 0.1 08/20/2023    BMET Lab Results  Component Value Date   NA 139 09/30/2023   K 4.1 09/30/2023   CL 104 09/30/2023   CO2 26 09/30/2023   GLUCOSE 142 (H) 09/30/2023   BUN 15 09/30/2023   CREATININE 0.98 09/30/2023   CALCIUM  9.4 09/30/2023  GFRNONAA >60 08/20/2023   GFRAA 79 12/04/2019      Assessment and Plan  HIV disease= will check Labs to see that he is still undetectable.   Long term medication management = will check cr to see that he is stable  Health maintenance= will check lipids and RPR Deferred vaccine  COPD=Amb ref to pulmonary-for COPD management, may need to consider drug interactions given he is on protease inhibitor. We will refill albuterol 

## 2023-12-06 NOTE — Patient Instructions (Signed)
   Please ask your supervisor to get FMLA paperwork so that you can attend doctor's visit without getting points

## 2023-12-07 ENCOUNTER — Encounter: Admitting: Student

## 2023-12-07 ENCOUNTER — Telehealth: Payer: Self-pay | Admitting: *Deleted

## 2023-12-07 LAB — T-HELPER CELL (CD4) - (RCID CLINIC ONLY)
CD4 % Helper T Cell: 12 % — ABNORMAL LOW (ref 33–65)
CD4 T Cell Abs: 98 /uL — ABNORMAL LOW (ref 400–1790)

## 2023-12-07 NOTE — Telephone Encounter (Signed)
 Copied from CRM #[id number]. Topic: Clinical - Prescription Issue >> Dec 07, 2023  9:57 AM Graeme ORN wrote: Reason for CRM: Gene called from [facility] Phone: [phone] ext: [phone]. Wanted to know if patient made appt. Called [facility] - pt did not  show. Caller would like to speak with someone about reconciling patient record/medications. Thank You    ----------------------------------------------------------------------- From previous Reason for Contact - Other: Reason for CRM: [phone] ext: [phone]

## 2023-12-07 NOTE — Progress Notes (Deleted)
 CC: ***  HPI:  [name] is a 53 y.o. male with past medical history of HFrEF, COPD, HIV who presents for follow-up appointment.  Please see assessment and plan for full HPI.  Medications: COPD: Albuterol  2 puffs every 6 hours as needed Prior DVT: Eliquis  2.5 mg twice daily (expired) GERD: Tums HIV: Symtuza  1 tablet daily, Tivicay  50 mg daily, Diflucan  200 mg daily, Bactrim  800-160 mg daily Diabetes: Lantus  36 units nightly, Humalog  sliding scale, Janumet  1 tablet twice daily Hypertension: Imdur  50 mg daily Hyperlipidemia: Crestor  20 mg daily  Most recent labs. 12/05/2021 LDL 120, cholesterol 202, triglycerides 121, HDL 59 HIV viral load in process CD4 in process  \CBC: White count 3.5 otherwise normal CMP showing elevated glucose at 2.52, creatinine 1.37  A1c on 09/04/2018 2510.8  Obtain A1c today  Past Medical History:  Diagnosis Date   Acute pulmonary embolus ([name]) 02/24/2016   Dx December 2017 taking Eliquis  5mg  BID   Allergy 09/30/2021   Bronchiectasis with acute exacerbation ([name])    Bronchiectasis without acute exacerbation ([name]) 04/22/2021   Depression    stress and depression for any man is common (03/21/2014)   Diabetes mellitus without complication ([name])    DVT (deep venous thrombosis) ([name]) 11/03/2020   Dyspnea    Genital warts 01/04/2017   Hepatitis    I don't know what hepatitis I have   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   History of tuberculosis 11/01/2020   HIV disease ([name])    MSSA bacteremia    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)    S/P ORIF (open reduction internal fixation) fracture 03/21/2014   Steroid-induced hyperglycemia 09/05/2021   TB (pulmonary tuberculosis)    previously treated according to refugee documentation     Current Outpatient Medications:    albuterol  (VENTOLIN  HFA) 108 (90 Base) MCG/ACT inhaler,  Inhale 2 puffs into the lungs every 6 (six) hours as needed for wheezing or shortness of breath., Disp: 8 g, Rfl: 0   albuterol  (VENTOLIN  HFA) 108 (90 Base) MCG/ACT inhaler, Inhale 2 puffs into the lungs every 6 (six) hours as needed for wheezing or shortness of breath., Disp: 6.7 g, Rfl: 3   apixaban  (ELIQUIS ) 2.5 MG TABS tablet, Take 2 tablets (5 mg total) by mouth 2 (two) times daily for 7 days, THEN 1 tablet (2.5 mg total) 2 (two) times daily., Disp: 60 tablet, Rfl: 0   Ascorbic Acid (VITAMIN C PO), Take 1 tablet by mouth daily., Disp: , Rfl:    benzonatate  (TESSALON  PERLES) 100 MG capsule, Take 1 capsule (100 mg total) by mouth 3 (three) times daily as needed for cough., Disp: 20 capsule, Rfl: 0   Blood Glucose Monitoring Suppl (BLOOD GLUCOSE MONITOR SYSTEM) w/Device KIT, Use as directed in the morning, at noon, and at bedtime., Disp: 1 kit, Rfl: 0   calcium  carbonate (TUMS - DOSED IN MG ELEMENTAL CALCIUM ) 500 MG chewable tablet, Chew 1 tablet by mouth as needed for indigestion or heartburn., Disp: , Rfl:    Darunavir -Cobicistat -Emtricitabine -Tenofovir  Alafenamide (SYMTUZA ) 800-150-200-10 MG TABS, Take 1 tablet by mouth daily with breakfast., Disp: 30 tablet, Rfl: 11   dolutegravir  (TIVICAY ) 50 MG tablet, Take 1 tablet (50 mg total) by mouth daily., Disp: 30 tablet, Rfl: 11   fluconazole  (DIFLUCAN ) 200 MG tablet, Take 1 tablet (200 mg total) by mouth daily., Disp: 10 tablet, Rfl: 0   insulin  glargine (LANTUS ) 100  UNIT/ML Solostar Pen, Inject 36 Units into the skin at bedtime.Ingiza vitengo 36 w[place], Disp: 15 mL, Rfl: 0   insulin  lispro (HUMALOG  KWIKPEN) 100 UNIT/ML KwikPen, Inject into the skin before each meal 3 times a day using the following sliding scale, 140-199 - 2 units, 200-250 - 4 units, 251-299 - 6 units,  300-349 - 8 units,  350 or above 10 units. Ingiza ndani ya ngozi kabla ya kila mlo mara 3 kwa siku kwa kutumia mizani ifuatayo ya kuteleza, vitengo 140-199 - 2, vitengo 200-250  - 4, vitengo 251-299 - 6,  300-349 - 8, 350 au zaidi ya vitengo 10., Disp: 15 mL, Rfl: 0   Insulin  Pen Needle 32G X 4 MM MISC, Use 4 times a day, Disp: 100 each, Rfl: 0   isosorbide  mononitrate (IMDUR ) 30 MG 24 hr tablet, Take 0.5 tablets (15 mg total) by mouth daily., Disp: 30 tablet, Rfl: 0   Multiple Vitamins-Minerals (MULTIVITAMIN ADULTS PO), Take 1 each by mouth daily., Disp: , Rfl:    rosuvastatin  (CRESTOR ) 20 MG tablet, Take 1 tablet (20 mg total) by mouth daily., Disp: 30 tablet, Rfl: 0   sitaGLIPtin -metformin  (JANUMET ) 50-500 MG tablet, Take 1 tablet by mouth 2 (two) times daily with a meal., Disp: , Rfl:    sulfamethoxazole -trimethoprim  (BACTRIM  DS) 800-160 MG tablet, Take 1 tablet by mouth daily., Disp: 30 tablet, Rfl: 5  Review of Systems:  ***  Constitutional: Eye: Respiratory: Cardiovascular: GI: MSK: GU: Skin: Neuro: Endocrine:   Physical Exam:  There were no vitals filed for this visit. *** General: Patient is sitting comfortably in the room  Eyes: Pupils equal and reactive to light, EOM intact  Head: Normocephalic, atraumatic  Neck: Supple, nontender, full range of motion, No JVD Cardio: Regular rate and rhythm, no murmurs, rubs or gallops. 2+ pulses to bilateral upper and lower extremities  Chest: No chest tenderness Pulmonary: Clear to ausculation bilaterally with no rales, rhonchi, and crackles  Abdomen: Soft, nontender with normoactive bowel sounds with no rebound or guarding  Neuro: Alert and orientated x3. CN II-XII intact. Sensation intact to upper and lower extremities. 2+ patellar reflex.  Back: No midline tenderness, no step off or deformities noted. No paraspinal muscle tenderness.  Skin: No rashes noted  MSK: 5/5 strength to upper and lower extremities.    Assessment & Plan:   No problem-specific Assessment & Plan notes found for this encounter.    Patient {GC/[id number]::discussed with,seen with} Dr.  {[id number]::Hlpoonli,Ynqqfjw,Floozw,Wjmzwimj,Tpoopjfd,Cpwrzwu}  Libby Blanch, DO Internal Medicine Resident PGY-3

## 2023-12-08 LAB — COMPLETE METABOLIC PANEL WITHOUT GFR
AG Ratio: 1.1 (calc) (ref 1.0–2.5)
ALT: 16 U/L (ref 9–46)
AST: 18 U/L (ref 10–35)
Albumin: 3.8 g/dL (ref 3.6–5.1)
Alkaline phosphatase (APISO): 84 U/L (ref 35–144)
BUN/Creatinine Ratio: 15 (calc) (ref 6–22)
BUN: 21 mg/dL (ref 7–25)
CO2: 26 mmol/L (ref 20–32)
Calcium: 8.7 mg/dL (ref 8.6–10.3)
Chloride: 102 mmol/L (ref 98–110)
Creat: 1.37 mg/dL — ABNORMAL HIGH (ref 0.70–1.30)
Globulin: 3.4 g/dL (ref 1.9–3.7)
Glucose, Bld: 252 mg/dL — ABNORMAL HIGH (ref 65–99)
Potassium: 3.9 mmol/L (ref 3.5–5.3)
Sodium: 138 mmol/L (ref 135–146)
Total Bilirubin: 0.3 mg/dL (ref 0.2–1.2)
Total Protein: 7.2 g/dL (ref 6.1–8.1)

## 2023-12-08 LAB — CBC WITH DIFFERENTIAL/PLATELET
Absolute Lymphocytes: 1036 {cells}/uL (ref 850–3900)
Absolute Monocytes: 291 {cells}/uL (ref 200–950)
Basophils Absolute: 11 {cells}/uL (ref 0–200)
Basophils Relative: 0.3 %
Eosinophils Absolute: 291 {cells}/uL (ref 15–500)
Eosinophils Relative: 8.3 %
HCT: 46.7 % (ref 38.5–50.0)
Hemoglobin: 15 g/dL (ref 13.2–17.1)
MCH: 31.9 pg (ref 27.0–33.0)
MCHC: 32.1 g/dL (ref 32.0–36.0)
MCV: 99.4 fL (ref 80.0–100.0)
MPV: 10.4 fL (ref 7.5–12.5)
Monocytes Relative: 8.3 %
Neutro Abs: 1873 {cells}/uL (ref 1500–7800)
Neutrophils Relative %: 53.5 %
Platelets: 184 Thousand/uL (ref 140–400)
RBC: 4.7 Million/uL (ref 4.20–5.80)
RDW: 11.2 % (ref 11.0–15.0)
Total Lymphocyte: 29.6 %
WBC: 3.5 Thousand/uL — ABNORMAL LOW (ref 3.8–10.8)

## 2023-12-08 LAB — LIPID PANEL
Cholesterol: 202 mg/dL — ABNORMAL HIGH (ref <200)
HDL: 59 mg/dL (ref >=40)
LDL Cholesterol (Calc): 120 mg/dL — ABNORMAL HIGH
Non-HDL Cholesterol (Calc): 143 mg/dL — ABNORMAL HIGH (ref <130)
Total CHOL/HDL Ratio: 3.4 (calc) (ref <5.0)
Triglycerides: 121 mg/dL (ref <150)

## 2023-12-08 LAB — RPR: RPR Ser Ql: NONREACTIVE

## 2023-12-08 LAB — HIV-1 RNA QUANT-NO REFLEX-BLD
HIV 1 RNA Quant: 21 {copies}/mL — ABNORMAL HIGH
HIV-1 RNA Quant, Log: 1.32 {Log_copies}/mL — ABNORMAL HIGH

## 2023-12-13 ENCOUNTER — Other Ambulatory Visit: Payer: Self-pay

## 2023-12-13 ENCOUNTER — Emergency Department (HOSPITAL_COMMUNITY)

## 2023-12-13 ENCOUNTER — Inpatient Hospital Stay (HOSPITAL_COMMUNITY)
Admission: EM | Admit: 2023-12-13 | Discharge: 2023-12-16 | DRG: 974 | Disposition: A | Discharge status: Home / Self Care

## 2023-12-13 ENCOUNTER — Encounter (HOSPITAL_COMMUNITY): Payer: Self-pay

## 2023-12-13 DIAGNOSIS — N179 Acute kidney failure, unspecified: Secondary | ICD-10-CM | POA: Diagnosis present

## 2023-12-13 DIAGNOSIS — J441 Chronic obstructive pulmonary disease with (acute) exacerbation: Secondary | ICD-10-CM | POA: Diagnosis present

## 2023-12-13 DIAGNOSIS — Z79899 Other long term (current) drug therapy: Secondary | ICD-10-CM

## 2023-12-13 DIAGNOSIS — E1165 Type 2 diabetes mellitus with hyperglycemia: Secondary | ICD-10-CM | POA: Diagnosis present

## 2023-12-13 DIAGNOSIS — R39198 Other difficulties with micturition: Secondary | ICD-10-CM | POA: Diagnosis present

## 2023-12-13 DIAGNOSIS — E1159 Type 2 diabetes mellitus with other circulatory complications: Secondary | ICD-10-CM | POA: Diagnosis present

## 2023-12-13 DIAGNOSIS — I5042 Chronic combined systolic (congestive) and diastolic (congestive) heart failure: Secondary | ICD-10-CM | POA: Diagnosis present

## 2023-12-13 DIAGNOSIS — J449 Chronic obstructive pulmonary disease, unspecified: Secondary | ICD-10-CM

## 2023-12-13 DIAGNOSIS — J9601 Acute respiratory failure with hypoxia: Principal | ICD-10-CM

## 2023-12-13 DIAGNOSIS — Z7901 Long term (current) use of anticoagulants: Secondary | ICD-10-CM

## 2023-12-13 DIAGNOSIS — I5022 Chronic systolic (congestive) heart failure: Secondary | ICD-10-CM | POA: Diagnosis present

## 2023-12-13 DIAGNOSIS — A419 Sepsis, unspecified organism: Principal | ICD-10-CM | POA: Diagnosis present

## 2023-12-13 DIAGNOSIS — R590 Localized enlarged lymph nodes: Secondary | ICD-10-CM | POA: Diagnosis present

## 2023-12-13 DIAGNOSIS — R0602 Shortness of breath: Secondary | ICD-10-CM | POA: Diagnosis not present

## 2023-12-13 DIAGNOSIS — E7849 Other hyperlipidemia: Secondary | ICD-10-CM | POA: Diagnosis present

## 2023-12-13 DIAGNOSIS — R599 Enlarged lymph nodes, unspecified: Secondary | ICD-10-CM | POA: Diagnosis present

## 2023-12-13 DIAGNOSIS — N1831 Chronic kidney disease, stage 3a: Secondary | ICD-10-CM | POA: Diagnosis present

## 2023-12-13 DIAGNOSIS — J479 Bronchiectasis, uncomplicated: Secondary | ICD-10-CM | POA: Diagnosis present

## 2023-12-13 DIAGNOSIS — I152 Hypertension secondary to endocrine disorders: Secondary | ICD-10-CM | POA: Diagnosis present

## 2023-12-13 DIAGNOSIS — Z8249 Family history of ischemic heart disease and other diseases of the circulatory system: Secondary | ICD-10-CM

## 2023-12-13 DIAGNOSIS — Z86718 Personal history of other venous thrombosis and embolism: Secondary | ICD-10-CM

## 2023-12-13 DIAGNOSIS — Z8611 Personal history of tuberculosis: Secondary | ICD-10-CM

## 2023-12-13 DIAGNOSIS — Z933 Colostomy status: Secondary | ICD-10-CM

## 2023-12-13 DIAGNOSIS — J439 Emphysema, unspecified: Secondary | ICD-10-CM | POA: Diagnosis present

## 2023-12-13 DIAGNOSIS — D6859 Other primary thrombophilia: Secondary | ICD-10-CM | POA: Diagnosis present

## 2023-12-13 DIAGNOSIS — Z86711 Personal history of pulmonary embolism: Secondary | ICD-10-CM

## 2023-12-13 DIAGNOSIS — J9621 Acute and chronic respiratory failure with hypoxia: Secondary | ICD-10-CM | POA: Diagnosis present

## 2023-12-13 DIAGNOSIS — R6520 Severe sepsis without septic shock: Secondary | ICD-10-CM | POA: Diagnosis present

## 2023-12-13 DIAGNOSIS — B20 Human immunodeficiency virus [HIV] disease: Secondary | ICD-10-CM | POA: Diagnosis present

## 2023-12-13 DIAGNOSIS — J189 Pneumonia, unspecified organism: Secondary | ICD-10-CM | POA: Diagnosis present

## 2023-12-13 DIAGNOSIS — Z87891 Personal history of nicotine dependence: Secondary | ICD-10-CM

## 2023-12-13 DIAGNOSIS — Z888 Allergy status to other drugs, medicaments and biological substances status: Secondary | ICD-10-CM

## 2023-12-13 DIAGNOSIS — E1169 Type 2 diabetes mellitus with other specified complication: Secondary | ICD-10-CM | POA: Diagnosis present

## 2023-12-13 DIAGNOSIS — B59 Pneumocystosis: Secondary | ICD-10-CM | POA: Diagnosis present

## 2023-12-13 DIAGNOSIS — E1122 Type 2 diabetes mellitus with diabetic chronic kidney disease: Secondary | ICD-10-CM | POA: Diagnosis present

## 2023-12-13 DIAGNOSIS — Z794 Long term (current) use of insulin: Secondary | ICD-10-CM

## 2023-12-13 LAB — RESPIRATORY PANEL BY PCR

## 2023-12-13 LAB — I-STAT VENOUS BLOOD GAS, ED
Acid-Base Excess: 5 mmol/L — ABNORMAL HIGH (ref 0.0–2.0)
Bicarbonate: 32.3 mmol/L — ABNORMAL HIGH (ref 20.0–28.0)
Calcium, Ion: 1.14 mmol/L — ABNORMAL LOW (ref 1.15–1.40)
HCT: 46 % (ref 39.0–52.0)
Hemoglobin: 15.6 g/dL (ref 13.0–17.0)
O2 Saturation: 99 %
Potassium: 6.7 mmol/L (ref 3.5–5.1)
Sodium: 136 mmol/L (ref 135–145)
TCO2: 34 mmol/L — ABNORMAL HIGH (ref 22–32)
pCO2, Ven: 55.7 mmHg (ref 44–60)
pH, Ven: 7.371 (ref 7.25–7.43)
pO2, Ven: 140 mmHg — ABNORMAL HIGH (ref 32–45)

## 2023-12-13 LAB — URINALYSIS, ROUTINE W REFLEX MICROSCOPIC
Bilirubin Urine: NEGATIVE
Glucose, UA: 150 mg/dL — AB
Hgb urine dipstick: NEGATIVE
Ketones, ur: NEGATIVE mg/dL
Leukocytes,Ua: NEGATIVE
Nitrite: NEGATIVE
Protein, ur: NEGATIVE mg/dL
Specific Gravity, Urine: 1.029 (ref 1.005–1.030)
pH: 5 (ref 5.0–8.0)

## 2023-12-13 LAB — CBG MONITORING, ED: Glucose-Capillary: 253 mg/dL — ABNORMAL HIGH (ref 70–99)

## 2023-12-13 LAB — CBC WITH DIFFERENTIAL/PLATELET
Abs Immature Granulocytes: 0.04 K/uL (ref 0.00–0.07)
Basophils Absolute: 0 K/uL (ref 0.0–0.1)
Basophils Relative: 0 %
Eosinophils Absolute: 0.3 K/uL (ref 0.0–0.5)
Eosinophils Relative: 3 %
HCT: 45.3 % (ref 39.0–52.0)
Hemoglobin: 15.2 g/dL (ref 13.0–17.0)
Immature Granulocytes: 0 %
Lymphocytes Relative: 28 %
Lymphs Abs: 2.7 K/uL (ref 0.7–4.0)
MCH: 31.8 pg (ref 26.0–34.0)
MCHC: 33.6 g/dL (ref 30.0–36.0)
MCV: 94.8 fL (ref 80.0–100.0)
Monocytes Absolute: 1.2 K/uL — ABNORMAL HIGH (ref 0.1–1.0)
Monocytes Relative: 13 %
Neutro Abs: 5.4 K/uL (ref 1.7–7.7)
Neutrophils Relative %: 56 %
Platelets: 250 K/uL (ref 150–400)
RBC: 4.78 MIL/uL (ref 4.22–5.81)
RDW: 12.4 % (ref 11.5–15.5)
WBC: 9.6 K/uL (ref 4.0–10.5)
nRBC: 0 % (ref 0.0–0.2)

## 2023-12-13 LAB — COMPREHENSIVE METABOLIC PANEL WITH GFR
ALT: 18 U/L (ref 0–44)
AST: 31 U/L (ref 15–41)
Albumin: 2.9 g/dL — ABNORMAL LOW (ref 3.5–5.0)
Alkaline Phosphatase: 60 U/L (ref 38–126)
Anion gap: 8 (ref 5–15)
BUN: 21 mg/dL — ABNORMAL HIGH (ref 6–20)
CO2: 25 mmol/L (ref 22–32)
Calcium: 8.9 mg/dL (ref 8.9–10.3)
Chloride: 102 mmol/L (ref 98–111)
Creatinine, Ser: 1.64 mg/dL — ABNORMAL HIGH (ref 0.61–1.24)
GFR, Estimated: 50 mL/min — ABNORMAL LOW (ref >60)
Glucose, Bld: 178 mg/dL — ABNORMAL HIGH (ref 70–99)
Potassium: 4.5 mmol/L (ref 3.5–5.1)
Sodium: 135 mmol/L (ref 135–145)
Total Bilirubin: 0.8 mg/dL (ref 0.0–1.2)
Total Protein: 7.6 g/dL (ref 6.5–8.1)

## 2023-12-13 LAB — CD4/CD8 (T-HELPER/T-SUPPRESSOR CELL)
CD4 absolute: 100 /uL — ABNORMAL LOW (ref 400–1790)
CD4%: 4.56 % — ABNORMAL LOW (ref 33–65)
CD8 T Cell Abs: 1149 /uL — ABNORMAL HIGH (ref 190–1000)
CD8tox: 52.62 % — ABNORMAL HIGH (ref 12–40)
Ratio: 0.09 — ABNORMAL LOW (ref 1.0–3.0)
Total lymphocyte count: 2183 /uL (ref 1000–4000)

## 2023-12-13 LAB — PROTIME-INR
INR: 1 (ref 0.8–1.2)
Prothrombin Time: 13.8 s (ref 11.4–15.2)

## 2023-12-13 LAB — GLUCOSE, CAPILLARY
Glucose-Capillary: 182 mg/dL — ABNORMAL HIGH (ref 70–99)
Glucose-Capillary: 253 mg/dL — ABNORMAL HIGH (ref 70–99)

## 2023-12-13 LAB — PSA: Prostatic Specific Antigen: 2.1 ng/mL (ref 0.00–4.00)

## 2023-12-13 LAB — RESP PANEL BY RT-PCR (RSV, FLU A&B, COVID)  RVPGX2
Influenza A by PCR: NEGATIVE
Influenza B by PCR: NEGATIVE
Resp Syncytial Virus by PCR: NEGATIVE
SARS Coronavirus 2 by RT PCR: NEGATIVE

## 2023-12-13 LAB — I-STAT CG4 LACTIC ACID, ED: Lactic Acid, Venous: 1 mmol/L (ref 0.5–1.9)

## 2023-12-13 LAB — TROPONIN I (HIGH SENSITIVITY): Troponin I (High Sensitivity): 6 ng/L (ref <18)

## 2023-12-13 LAB — MRSA NEXT GEN BY PCR, NASAL: MRSA by PCR Next Gen: NOT DETECTED

## 2023-12-13 MED ORDER — SODIUM CHLORIDE 0.9 % IV SOLN: 500.0000 mg | INTRAVENOUS | Status: DC

## 2023-12-13 MED ORDER — LACTATED RINGERS IV BOLUS
1000.0000 mL | Freq: Once | INTRAVENOUS | Status: AC
  Administered 2023-12-13: 1000 mL via INTRAVENOUS

## 2023-12-13 MED ORDER — BENZONATATE 100 MG PO CAPS
200.0000 mg | ORAL_CAPSULE | Freq: Three times a day (TID) | ORAL | Status: DC | PRN
  Administered 2023-12-13 – 2023-12-16 (×4): 200 mg via ORAL
  Filled 2023-12-13 (×4): qty 2

## 2023-12-13 MED ORDER — ACETAMINOPHEN 500 MG PO TABS
1000.0000 mg | ORAL_TABLET | Freq: Four times a day (QID) | ORAL | Status: DC | PRN
  Administered 2023-12-15: 1000 mg via ORAL
  Filled 2023-12-13 (×2): qty 2

## 2023-12-13 MED ORDER — ENOXAPARIN SODIUM 40 MG/0.4ML IJ SOSY
40.0000 mg | PREFILLED_SYRINGE | INTRAMUSCULAR | Status: DC
Start: 2023-12-13 — End: 2023-12-16
  Administered 2023-12-13 – 2023-12-16 (×4): 40 mg via SUBCUTANEOUS
  Filled 2023-12-13 (×4): qty 0.4

## 2023-12-13 MED ORDER — INSULIN GLARGINE-YFGN 100 UNIT/ML ~~LOC~~ SOLN: 25.0000 [IU] | Freq: Every day | SUBCUTANEOUS | Status: DC

## 2023-12-13 MED ORDER — IOHEXOL 350 MG/ML SOLN
75.0000 mL | Freq: Once | INTRAVENOUS | Status: AC | PRN
  Administered 2023-12-13: 75 mL via INTRAVENOUS

## 2023-12-13 MED ORDER — MAGNESIUM SULFATE 2 GM/50ML IV SOLN
2.0000 g | Freq: Once | INTRAVENOUS | Status: AC
  Administered 2023-12-13: 2 g via INTRAVENOUS
  Filled 2023-12-13: qty 50

## 2023-12-13 MED ORDER — FENTANYL CITRATE PF 50 MCG/ML IJ SOSY
50.0000 ug | PREFILLED_SYRINGE | INTRAMUSCULAR | Status: DC | PRN
  Administered 2023-12-13: 50 ug via INTRAVENOUS
  Filled 2023-12-13: qty 1

## 2023-12-13 MED ORDER — INSULIN ASPART 100 UNIT/ML IJ SOLN
0.0000 [IU] | Freq: Three times a day (TID) | INTRAMUSCULAR | Status: DC
  Administered 2023-12-13: 8 [IU] via SUBCUTANEOUS
  Administered 2023-12-13 – 2023-12-14 (×2): 3 [IU] via SUBCUTANEOUS
  Administered 2023-12-14 – 2023-12-15 (×3): 2 [IU] via SUBCUTANEOUS

## 2023-12-13 MED ORDER — SULFAMETHOXAZOLE-TRIMETHOPRIM 800-160 MG PO TABS
1.0000 | ORAL_TABLET | Freq: Every day | ORAL | Status: DC
  Administered 2023-12-13 – 2023-12-16 (×4): 1 via ORAL
  Filled 2023-12-13 (×4): qty 1

## 2023-12-13 MED ORDER — VANCOMYCIN HCL 2000 MG/400ML IV SOLN
2000.0000 mg | Freq: Once | INTRAVENOUS | Status: AC
Start: 2023-12-13 — End: 2023-12-13
  Administered 2023-12-13: 2000 mg via INTRAVENOUS
  Filled 2023-12-13: qty 400

## 2023-12-13 MED ORDER — INSULIN GLARGINE 100 UNIT/ML ~~LOC~~ SOLN
25.0000 [IU] | Freq: Every day | SUBCUTANEOUS | Status: DC
  Administered 2023-12-13 – 2023-12-15 (×3): 25 [IU] via SUBCUTANEOUS
  Filled 2023-12-13 (×5): qty 0.25

## 2023-12-13 MED ORDER — SODIUM CHLORIDE 0.9 % IV SOLN
1.0000 g | INTRAVENOUS | Status: DC
  Administered 2023-12-14 – 2023-12-16 (×3): 1 g via INTRAVENOUS
  Filled 2023-12-13 (×3): qty 10

## 2023-12-13 MED ORDER — ONDANSETRON HCL 4 MG/2ML IJ SOLN: 4.0000 mg | Freq: Three times a day (TID) | INTRAMUSCULAR | Status: DC | PRN

## 2023-12-13 MED ORDER — DOLUTEGRAVIR SODIUM 50 MG PO TABS
50.0000 mg | ORAL_TABLET | Freq: Every day | ORAL | Status: DC
  Administered 2023-12-13 – 2023-12-16 (×4): 50 mg via ORAL
  Filled 2023-12-13 (×4): qty 1

## 2023-12-13 MED ORDER — DARUN-COBIC-EMTRICIT-TENOFAF 800-150-200-10 MG PO TABS
1.0000 | ORAL_TABLET | Freq: Every day | ORAL | Status: DC
  Administered 2023-12-14 – 2023-12-16 (×3): 1 via ORAL
  Filled 2023-12-13 (×4): qty 1

## 2023-12-13 MED ORDER — PIPERACILLIN-TAZOBACTAM 3.375 G IVPB 30 MIN
3.3750 g | Freq: Once | INTRAVENOUS | Status: AC
  Administered 2023-12-13: 3.375 g via INTRAVENOUS
  Filled 2023-12-13: qty 50

## 2023-12-13 NOTE — ED Provider Notes (Signed)
 [name] EMERGENCY DEPARTMENT AT [facility] Provider Note   CSN: [id number] Arrival date & time: 12/13/23  0421     History Chief Complaint  Patient presents with   Shortness of Breath    HPI [name] is a 53 y.o. male presenting for chief complaint of shortness of breath, severe hypoxia.  History of AIDS recently reconnected with infectious disease. History of COPD on 2 L nasal cannula.  History of recurrent pneumonia secondary to bronchiectasis, HIV.  Recently admitted in June for similar coming in with acute hypoxic respiratory distress sats at home 82 to 86% per EMS.  Patient's recorded medical, surgical, social, medication list and allergies were reviewed in the Snapshot window as part of the initial history.   Review of Systems   Review of Systems  Constitutional:  Positive for fever. Negative for chills.  HENT:  Negative for ear pain and sore throat.   Eyes:  Negative for pain and visual disturbance.  Respiratory:  Positive for cough and shortness of breath.   Cardiovascular:  Positive for chest pain. Negative for palpitations.  Gastrointestinal:  Negative for abdominal pain and vomiting.  Genitourinary:  Negative for dysuria and hematuria.  Musculoskeletal:  Negative for arthralgias and back pain.  Skin:  Negative for color change and rash.  Neurological:  Positive for weakness. Negative for seizures and syncope.  All other systems reviewed and are negative.   Physical Exam Updated Vital Signs BP 133/78   Pulse (!) 109   Temp 98.7 F (37.1 C) (Oral)   Resp (!) 23   Ht 5' 8 (1.727 m)   Wt 88.9 kg   SpO2 100%   BMI 29.80 kg/m  Physical Exam Vitals and nursing note reviewed.  Constitutional:      General: He is not in acute distress.    Appearance: He is well-developed.  HENT:     Head: Normocephalic and atraumatic.  Eyes:     Conjunctiva/sclera: Conjunctivae normal.  Cardiovascular:     Rate and Rhythm: Normal rate and regular  rhythm.     Heart sounds: No murmur heard. Pulmonary:     Effort: Pulmonary effort is normal. No respiratory distress.     Breath sounds: Normal breath sounds.  Abdominal:     Palpations: Abdomen is soft.     Tenderness: There is no abdominal tenderness.  Musculoskeletal:        General: No swelling.     Cervical back: Neck supple.  Skin:    General: Skin is warm and dry.     Capillary Refill: Capillary refill takes less than 2 seconds.  Neurological:     Mental Status: He is alert.  Psychiatric:        Mood and Affect: Mood normal.      ED Course/ Medical Decision Making/ A&P    Procedures .Critical Care  Performed by: Jerral Meth, MD Authorized by: Jerral Meth, MD   Critical care provider statement:    Critical care time (minutes):  30   Critical care was necessary to treat or prevent imminent or life-threatening deterioration of the following conditions:  Respiratory failure   Critical care was time spent personally by me on the following activities:  Development of treatment plan with patient or surrogate, discussions with consultants, evaluation of patient's response to treatment, examination of patient, ordering and review of laboratory studies, ordering and review of radiographic studies, ordering and performing treatments and interventions, pulse oximetry, re-evaluation of patient's condition and review of  old charts    Medications Ordered in ED Medications  vancomycin  (VANCOREADY) IVPB 2000 mg/400 mL (2,000 mg Intravenous New Bag/Given 12/13/23 0539)  fentaNYL  (SUBLIMAZE ) injection 50 mcg (50 mcg Intravenous Given 12/13/23 0544)  lactated ringers  bolus 1,000 mL (1,000 mLs Intravenous New Bag/Given 12/13/23 0503)  lactated ringers  bolus 1,000 mL (1,000 mLs Intravenous New Bag/Given 12/13/23 0503)  magnesium  sulfate IVPB 2 g 50 mL (2 g Intravenous New Bag/Given 12/13/23 0513)  piperacillin-tazobactam (ZOSYN) IVPB 3.375 g (0 g Intravenous Stopped 12/13/23 0534)    Medical Decision Making:   [name] is a 53 y.o. male who presented to the ED today with multiple symptoms detailed above.    Patient placed on continuous vitals and telemetry monitoring while in ED which was reviewed periodically.  Complete initial physical exam performed, notably the patient  was ill-appearing. During this initial exam, patient met criteria for activation of code sepsis due to presence of the following SIRS criteria as well as suspected infectious etiology:tachypnea,tachycardia Reviewed and confirmed nursing documentation for past medical history, family history, social history.    Initial Assessment:   With the patient's presentation of signs and symptoms of sepsis, most likely diagnosis is bacteremia secondary to underlying infection.  Considerations for source were initiated including:Urinary tract infections, abdominal infections such as cholecystitis/cholangitis/appendicitis, pulmonary etiology, bacteremia, skin etiology such as cellulitis or fasciitis, neurologic etiology such as meningitis or encephalitis.  This is most consistent with an acute life/limb threatening illness complicated by underlying chronic conditions.  Initial Plan:  Activated hospital protocol code sepsis including blood cultures, lactic acid screening, and further diagnostic care and management. Therapeutically, resuscitation fluids were considered. Patient has no contraindication to fluid resuscitation and therefore 30 cc of IV fluids per kilogram were administered Therapeutically, antibiotics were administered on a broad-spectrum nature. Undifferentiated source: Vancomycin  and cefepime  were administered to cover gram-positive and gram-negative high risk infections Screening labs including CBC and Metabolic panel to evaluate for infectious or metabolic etiology of disease.  Urinalysis with reflex culture ordered to evaluate for UTI or relevant urologic/nephrologic pathology.  CXR to  evaluate for structural/infectious intrathoracic pathology.  EKG and serial troponin to evaluate for cardiac pathology. Given his sudden onset worsening hypoxia, pulmonary embolism would be in the differential given sudden onset in nature.  CTA PE study ordered for further differentiation of patient's respiratory distress. Objective evaluation as below reviewed   Initial Study Results:   Laboratory  All laboratory results reviewed without evidence of clinically relevant pathology.    EKG EKG was reviewed independently. Rate, rhythm, axis, intervals all examined and without medically relevant abnormality. ST segments without concerns for elevations.    Radiology:  All images reviewed independently. Agree with radiology report at this time.   DG Chest Port 1 View if patient is in a treatment room. Result Date: 12/13/2023 CLINICAL DATA:  Cough and shortness of breath. EXAM: PORTABLE CHEST 1 VIEW COMPARISON:  08/14/2023 FINDINGS: Lungs are hyperexpanded. Interstitial markings are diffusely coarsened with chronic features. Patchy airspace disease in the left mid and lower lung is stable to minimally progressive in the interval. No substantial pleural effusion. Cardiopericardial silhouette is at upper limits of normal for size. No acute bony abnormality. Telemetry leads overlie the chest. IMPRESSION: Patchy airspace disease in the left mid and lower lung is stable to minimally progressive in the interval. Electronically Signed   By: Camellia Candle M.D.   On: 12/13/2023 05:36    Reassessment and Plan:   CTA PE study pending  at time of handoff to oncoming team.  Patient is already received IV antibiotics, pain has improved with medications, respiratory rate and heart rate are slowly improving at this time. Disposition pending CT angiography.  Anticipate admission for pneumonia.   Clinical Impression:  1. Acute hypoxic respiratory failure ([name])      Data Unavailable   Clinical Impression:  1.  Acute hypoxic respiratory failure ([name])      Data Unavailable   Final Clinical Impression(s) / ED Diagnoses Final diagnoses:  Acute hypoxic respiratory failure [facility])    Rx / DC Orders ED Discharge Orders     None         Jerral Meth, MD 12/13/23 [phone]

## 2023-12-13 NOTE — Plan of Care (Signed)

## 2023-12-13 NOTE — ED Notes (Signed)
 Pt requesting pain mediation for pain in ribs and diaphragm from coughing. Dr. Jerral notified

## 2023-12-13 NOTE — ED Provider Notes (Signed)
 Care was taken over from Dr. Jerral.  Patient with a history of AIDS, recently restarted his HAART presents with cough and worsening shortness of breath.  He has a new oxygen requirement and is currently on 8 L/min of oxygen.  Imaging studies do not reveal any evidence of PE but there is by lateral pneumonia.  Patient was given antibiotics per Dr. Jerral.  Discussed with the internal medicine teaching service who will admit the patient for further treatment.   Lenor Hollering, MD 12/13/23 [phone]

## 2023-12-13 NOTE — H&P (Signed)
 Date: 12/13/2023         Patient Name:  [name] MRN: [id number]  DOB: August 29, 1970 Age / Sex: 53 y.o., male   PCP: Tobie Gaines, DO         Medical Service: Internal Medicine Teaching Service         Attending Physician: Dr. Shawn Sick, MD    First Contact: Dr. Napoleon Pager: [phone]  Second Contact: Dr. Marylu Pager: [phone]                 Chief Concern: Cough, shortness of breath, chest pressure  History of Present Illness: 53 year old comes to ED by ambulance for cough, SOB, chest pressure. Symptoms started ~3 days ago after workday on Friday. Progressed over the weekend. This morning had chest pressure and severe cough associated with headache, thus he called 911. Sputum looks yellow, sometimes with blood. Reports chills, suspects fever but hasn't measured temperature. Feels somewhat better overall since arrival to hospital. No chest pressure at present.  History notable for HIV with AIDS, CD4 98/12% on 12/06/23. Reports good adherence to his antiretrovirals. Sees Dr. Luiz of infectious disease for this. Also takes insulin  for type 2 diabetes. Chart suggests history of COPD and bronchiectasis. He was hospitalized in June 2025 for similar symptoms and treated for pneumonia in setting of AIDS with ceftriaxone , azithromycin , and bactrim . He has chronic hypoxic respiratory failure on 2 liter/min of supplemental oxygen. He has had PE and LLE popliteal vein thrombosis in the past, Eliquis  was recommended and prescribed but he never started it.  Currently working at [employer]). Speak [name], from [place] originally. Former smoker, quit 20 years ago. Doesn't drink alcohol or use drugs.  In ED he was hypoxic with tachycardia and tachypnea, breathing was initially labored. He was treated with IV magnesium , IV antibiotics were started for sepsis due to pneumonia. CTA chest ruled out PE but showed patchy airspace disease in both lungs, most notably on left.  ROS per  above.   Allergies: Allergies  Allergen Reactions   Metformin  And Related Rash    Past Medical History: 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   COPD exacerbation ([name]) 06/04/2023   COPD (chronic obstructive pulmonary disease) ([name]) 04/09/2023   Pneumonia due to infectious organism 04/08/2023   Acute hypoxic respiratory failure ([name]) 04/08/2023   CAP (community acquired pneumonia) 04/07/2023   Hypertension associated with diabetes ([name]) 03/22/2023   Health care maintenance 03/22/2023   Adenopathy 12/07/2021   Atopic dermatitis 09/30/2021   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   Bronchiectasis ([name]) 04/22/2021   S/P colostomy ([name]) 02/06/2021   Type 2 diabetes mellitus with hyperlipidemia ([name]) 05/17/2019   Genital warts 05/06/2018   Pulmonary emphysema ([name])    Condyloma 01/04/2017   Chronic systolic CHF (congestive heart failure) ([name]) 04/24/2016   AIDS ([name])    HIV disease ([name]) 09/18/2013   Past Medical History:  Diagnosis Date   Acute pulmonary embolus ([name]) 02/24/2016   Dx December 2017 taking Eliquis  5mg  BID   Allergy 09/30/2021   Bronchiectasis with acute exacerbation ([name])    Bronchiectasis without acute exacerbation ([name]) 04/22/2021  Depression    stress and depression for any man is common (03/21/2014)   Diabetes mellitus without complication ([name])    DVT (deep venous thrombosis) ([name]) 11/03/2020   Dyspnea    Genital warts 01/04/2017   Hepatitis    I don't know what hepatitis I have   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   Current Outpatient Medications on File Prior to Encounter  Medication Sig Dispense Refill   acetaminophen  (TYLENOL ) 500 MG tablet Take 500 mg by mouth 2 (two) times daily as needed for fever or headache (pain).     albuterol  (VENTOLIN  HFA) 108 (90 Base) MCG/ACT inhaler Inhale 2 puffs into the lungs every 6 (six) hours as needed for wheezing or shortness of breath. 6.7 g 3   Darunavir -Cobicistat -Emtricitabine -Tenofovir  Alafenamide (SYMTUZA ) 800-150-200-10 MG TABS Take 1 tablet by mouth daily with breakfast. 30 tablet 11   dolutegravir  (TIVICAY ) 50 MG tablet Take 1 tablet (50 mg total) by mouth daily. 30 tablet 11   insulin  glargine (LANTUS ) 100 UNIT/ML Solostar Pen Inject 36 Units into the skin at bedtime.Ingiza vitengo 36 wakati wa [place] (Patient taking differently: Inject 0-10 Units into the skin See admin instructions. Inject 0-10 units into the skin three times daily before meals: BS<139 : 0 units 140-199 : 2 units 200-250 : 4 units 251-300 : 6 units 301-350 : 8 units BS>350 : 10 units) 15 mL 0   insulin  lispro (HUMALOG  KWIKPEN) 100 UNIT/ML KwikPen Inject into the skin before each meal 3 times a day using the following sliding scale, 140-199 - 2 units, 200-250 - 4 units, 251-299 - 6 units,  300-349 - 8 units,  350 or above 10 units. Ingiza ndani ya ngozi kabla ya kila mlo mara 3 kwa siku kwa kutumia mizani ifuatayo ya kuteleza, vitengo 140-199 - 2, vitengo 200-250 - 4, vitengo 251-299 - 6,  300-349 - 8, 350 au zaidi ya vitengo 10. (Patient taking  differently: Inject 0-10 Units into the skin See admin instructions. Inject 0-10 units into the skin three times daily before meals: BS<139 : 0 units 140-199 : 2 units 200-250 : 4 units 251-300 : 6 units 301-350 : 8 units BS>350 : 10 units Ingiza ndani ya ngozi kabla ya kila mlo mara 3 kwa siku kwa kutumia mizani ifuatayo ya kuteleza :  vitengo 140-199 : 2 vitengo 200-250 : 4 vitengo 251-299 : 6 vitengo 300-349 : 8 vitengo 350 au zaidi ya vitengo 10.) 15 mL 0   sulfamethoxazole -trimethoprim  (BACTRIM  DS) 800-160 MG tablet Take 1 tablet by mouth daily. 30 tablet 5   apixaban  (ELIQUIS ) 2.5 MG TABS tablet Take 2 tablets (5 mg total) by mouth 2 (two) times daily for 7 days, THEN 1 tablet (2.5 mg total) 2 (two) times daily. (Patient not taking: Reported on 12/13/2023) 60 tablet 0   Insulin  Pen Needle 32G X 4 MM MISC Use 4 times a day 100 each 0   isosorbide  mononitrate (IMDUR ) 30 MG 24 hr tablet Take 0.5 tablets (15 mg total) by mouth daily. (  Patient not taking: Reported on 12/13/2023) 30 tablet 0   rosuvastatin  (CRESTOR ) 20 MG tablet Take 1 tablet (20 mg total) by mouth daily. (Patient not taking: Reported on 12/13/2023) 30 tablet 0     Surgical History: Past Surgical History:  Procedure Laterality Date   BRONCHIAL WASHINGS Bilateral 04/23/2021   Procedure: BRONCHIAL WASHINGS;  Surgeon: Claudene Toribio BROCKS, MD;  Location: [facility] ENDOSCOPY;  Service: Pulmonary;  Laterality: Bilateral;   FRACTURE SURGERY     IM NAILING TIBIA Right 03/21/2014   ORIF ANKLE FRACTURE Right 03/21/2014   lateral malleolus/notes 03/21/2014   ORIF ANKLE FRACTURE Right 03/21/2014   Procedure: OPEN REDUCTION INTERNAL FIXATION (ORIF) pilon ;  Surgeon: Oneil BROCKS Herald, MD;  Location: [facility] OR;  Service: Orthopedics;  Laterality: Right;   TEE WITHOUT CARDIOVERSION N/A 05/23/2019   Procedure: TRANSESOPHAGEAL ECHOCARDIOGRAM (TEE);  Surgeon: Raford Riggs, MD;  Location: [facility] ENDOSCOPY;  Service: Cardiovascular;  Laterality: N/A;   TIBIA IM NAIL  INSERTION Right 03/21/2014   Procedure: INTRAMEDULLARY (IM) NAIL TIBIAL;  Surgeon: Oneil BROCKS Herald, MD;  Location: [facility] OR;  Service: Orthopedics;  Laterality: Right;   VIDEO BRONCHOSCOPY Bilateral 03/02/2016   Procedure: VIDEO BRONCHOSCOPY WITHOUT FLUORO;  Surgeon: Tonnie FORBES Flicker, MD;  Location: [facility] ENDOSCOPY;  Service: Cardiopulmonary;  Laterality: Bilateral;   VIDEO BRONCHOSCOPY Left 04/23/2021   Procedure: VIDEO BRONCHOSCOPY WITHOUT FLUORO;  Surgeon: Claudene Toribio BROCKS, MD;  Location: [facility] ENDOSCOPY;  Service: Pulmonary;  Laterality: Left;    Family History:  Family History  Problem Relation Age of Onset   Hypertension Other    Heart disease Sister     Social History:  Social History   Socioeconomic History   Marital status: Widowed    Spouse name: Not on file   Number of children: Not on file   Years of education: Not on file   Highest education level: Not on file  Occupational History   Not on file  Tobacco Use   Smoking status: Former    Current packs/day: 0.00    Average packs/day: 0.5 packs/day for 6.0 years (3.0 ttl pk-yrs)    Types: Cigarettes    Start date: 03/17/1995    Quit date: 03/16/2001    Years since quitting: 22.7   Smokeless tobacco: Never   Tobacco comments:    quit smoking ~ 2003  Vaping Use   Vaping status: Never Used  Substance and Sexual Activity   Alcohol use: Not Currently    Comment: drinks bottled beer intermittently   Drug use: No   Sexual activity: Not Currently    Partners: Female    Comment: declined condoms  Other Topics Concern   Not on file  Social History Narrative   As of 09/18/2013:   -Arrived in [place] : September 05, [place]    -Refugee from [facility], spent 4 years in refugee camp in [place].    -Language: [name], requires Architect (speaks essentially no [place])   -Education: no formal education, previously worked as a Product/process development scientist: family phone number [phone], caseworker is Bette Mering Dar Bi [phone] (with [employer])    -lives with daughter and three sons   -denies alcohol, drug, tobacco abuse      [facility] (02/28/16):   Patient now admitted to drinking bottled beer. Reports continued tobacco abstinence. Denies any bird or mold exposure. No pets currently. No recent travel.   Social Drivers of Corporate investment banker Strain: Not on file  Food Insecurity: Food Insecurity Present (08/15/2023)  Hunger Vital Sign    Worried About Programme researcher, broadcasting/film/video in the Last Year: Often true    Ran Out of Food in the Last Year: Never true  Transportation Needs: Unmet Transportation Needs (08/15/2023)   PRAPARE - Administrator, Civil Service (Medical): Yes    Lack of Transportation (Non-Medical): Yes  Physical Activity: Not on file  Stress: Not on file  Social Connections: Socially Integrated (08/15/2023)   Social Connection and Isolation Panel    Frequency of Communication with Friends and Family: More than three times a week    Frequency of Social Gatherings with Friends and Family: More than three times a week    Attends Religious Services: More than 4 times per year    Active Member of [employer] or Organizations: Yes    Attends Engineer, structural: More than 4 times per year    Marital Status: Married  Catering manager Violence: Not At Risk (08/15/2023)   Humiliation, Afraid, Rape, and Kick questionnaire    Fear of Current or Ex-Partner: No    Emotionally Abused: No    Physically Abused: No    Sexually Abused: No      Physical Exam: Blood pressure 121/77, pulse 98, temperature 98.5 F (36.9 C), temperature source Oral, resp. rate 17, height 5' 8 (1.727 m), weight 88.9 kg, SpO2 100%.  No distress Moist pink oral mucosa Heart rate normal, rhythm regular, strong radial and DP pulses bilaterally, trace LE edema Breathing unlabored on 5 liter/min via nasal cannula, coarse crackles throughout both lungs, paroxysms of cough noted Skin warm and dry Abdomen non-tender, ostomy site LLQ  looks normal Alert, oriented, no facial asymmetry, speech sounds normal, moving extremities normally  EKG:  Pending.  Labs: Venous blood gas is appropriate Slightly elevated creatinine from baseline Hypoalbuminemia Normal lactate and troponin Relative leukocytosis (3.5 PTA > 9.6) From 12/06/23, CD4 98/12%, HIV RNA quant log 1.32 (barely above reportable range) UA bland with some glucosuria COVID/Flu/RSV negative  Images and other studies: Per above.  Assessment & Plan:  [name] is a 53 y.o. with HIV and AIDS, COPD with bronchiectasis, and type 2 diabetes presenting with cough and dyspnea and admitted for hypoxic respiratory failure due to pneumonia in immunocompromised host.  Principal Problem:   Pneumonia Met sepsis criteria on presentation by heart rate and respiratory rate, has improved. This CAP could be routine bacterial pneumonia but I wonder about PJP and MAC in this person with AIDS and bronchiectasis here for 2nd pneumonia admission in ~4 months. This could be viral pneumonia (negative for COVID/flu/RSV) so check RVP. Sputum cultures ordered. Will narrow from vanc/zosyn to ceftriaxone  and azithromycin  given clinical improvement. Add bactrim  for PJP. Will ask ID to see in consult given immunocompromise status and concern for atypical etiologies, uncertain about azithromycin  if MAC is a real concern. -ceftriaxone  1 g q24 x 5 days -azithromycin  500 mg q24 x 3 days -bactrim  800-160 every day, resumed from outpatient  Active Problems:   HIV disease ([name])   AIDS ([name]) Chronic, despite relatively low viral counts CD4 count is low. Covering for PJP pneumonia now. Resume outpatient antiretrovirals. -symtuza  and tivicay  daily    Chronic systolic CHF (congestive heart failure) ([name]) With history of HFrecEF. Stable, doesn't look volume overloaded on exam even after sepsis-dose fluid bolus. Monitor clinically.    Type 2 diabetes mellitus with hyperlipidemia ([name]) With  mild hyperglycemia on admission. Restart outpatient insulin  at reduced dose. -glargine 25 units nightly -moderate  SSI    S/P colostomy ([name]) Chronic and stable, no complications with ostomy at present.    Bronchiectasis ([name]) Chronic, noted on imaging. Raises suspicion of chronic pulmonary infection, perhaps MAC. -aggressive pulmonary toilet    COPD (chronic obstructive pulmonary disease) with emphysema ([name]) Without record of formal PFT. No outpatient maintenance therapy. Needs PFTs outpatient. No wheezing on exam, lower suspicion for exacerbation of COPD now.    Adenopathy Hilar lymphadenopathy on cross-sectional chest imaging. May be related to acute infection. Follow-up imaging recommended after clinical improvement.    Hypertension associated with diabetes ([name]) Normal pressures this morning. Holding outpatient antihypertensives.    Acute on chronic hypoxic respiratory failure ([name]) Discharged from hospital in June 2025 on 2 liter/min supplemental home O2. Now requiring 5, but improving since arrival at ED. Wean to 2 liter/min. Ambulatory SpO2 monitoring, treat pneumonia per above.    CKD stage 3a, GFR 45-59 ml/min ([name]) With slightly elevated creatinine today. UA bland. Trend creatinine.    Difficulty urinating Incidental finding on history, ongoing ~1 month. Check PSA. Bladder scan. Hold anticholinergic medications.    Hypercoagulable state History of PE in 2017 and age-indeterminate clot in left popliteal vein discovered during June 2025 hospitalization. Eliquis  recommended at that time but he never started this. High risk of VTE at any rate given acute infection, start chemical prophylaxis but deferring therapeutic dosing for now.  Level of care: med-surg Diet: regular VTE: enoxaparin  (LOVENOX ) injection 40 mg Start: 12/13/23 1015 Code: full Surrogate: son Aime Mukendi  Signed: Ozell Kung MD 12/13/2023, 10:35 AM Pager: [phone]

## 2023-12-13 NOTE — Progress Notes (Signed)
 ED Pharmacy Antibiotic Sign Off An antibiotic consult was received from an ED provider for zosyn/vancomycin  per pharmacy dosing for sepsis. A chart review was completed to assess appropriateness. Hx for recurrent pneumonia and COPD.  The following one time order(s) were placed:   -Zosyn 3.375gm IV x1 -Vancomycin  2g IV x1  Further antibiotic and/or antibiotic pharmacy consults should be ordered by the admitting provider if indicated.   Thank you for allowing pharmacy to be a part of this patient's care.   Lynwood Poplar, [facility]  Clinical Pharmacist 12/13/23 4:44 AM

## 2023-12-13 NOTE — ED Notes (Signed)
 Pt and all belongings transported upstairs with transport

## 2023-12-13 NOTE — ED Triage Notes (Signed)
 BIB EMS from home for SOB for 4 days. Diagnosed with PNA a few days ago, on abx. 86% on RA when EMS arrived with labored breathing. EMS gave 125mg  solumderol, albuterol , atrovent  Hx COPD, asthma

## 2023-12-13 NOTE — Hospital Course (Addendum)
 Sob x 4 days Pna on abx Hypoxic respiratory failure Hyperglycemia ?ckd/aki aids Hilar lymphadenopathy Dm2 ?colostomy Htn Hfrecef Copd Urinary retention

## 2023-12-14 DIAGNOSIS — Z8249 Family history of ischemic heart disease and other diseases of the circulatory system: Secondary | ICD-10-CM | POA: Diagnosis not present

## 2023-12-14 DIAGNOSIS — J441 Chronic obstructive pulmonary disease with (acute) exacerbation: Secondary | ICD-10-CM | POA: Diagnosis present

## 2023-12-14 DIAGNOSIS — R599 Enlarged lymph nodes, unspecified: Secondary | ICD-10-CM

## 2023-12-14 DIAGNOSIS — I152 Hypertension secondary to endocrine disorders: Secondary | ICD-10-CM | POA: Diagnosis present

## 2023-12-14 DIAGNOSIS — D689 Coagulation defect, unspecified: Secondary | ICD-10-CM

## 2023-12-14 DIAGNOSIS — J439 Emphysema, unspecified: Secondary | ICD-10-CM | POA: Diagnosis present

## 2023-12-14 DIAGNOSIS — Z7901 Long term (current) use of anticoagulants: Secondary | ICD-10-CM | POA: Diagnosis not present

## 2023-12-14 DIAGNOSIS — Z933 Colostomy status: Secondary | ICD-10-CM | POA: Diagnosis not present

## 2023-12-14 DIAGNOSIS — B20 Human immunodeficiency virus [HIV] disease: Secondary | ICD-10-CM | POA: Diagnosis present

## 2023-12-14 DIAGNOSIS — E1165 Type 2 diabetes mellitus with hyperglycemia: Secondary | ICD-10-CM

## 2023-12-14 DIAGNOSIS — E1122 Type 2 diabetes mellitus with diabetic chronic kidney disease: Secondary | ICD-10-CM | POA: Diagnosis present

## 2023-12-14 DIAGNOSIS — A419 Sepsis, unspecified organism: Secondary | ICD-10-CM | POA: Diagnosis present

## 2023-12-14 DIAGNOSIS — Z794 Long term (current) use of insulin: Secondary | ICD-10-CM | POA: Diagnosis not present

## 2023-12-14 DIAGNOSIS — D6859 Other primary thrombophilia: Secondary | ICD-10-CM | POA: Diagnosis present

## 2023-12-14 DIAGNOSIS — J479 Bronchiectasis, uncomplicated: Secondary | ICD-10-CM

## 2023-12-14 DIAGNOSIS — R6520 Severe sepsis without septic shock: Secondary | ICD-10-CM

## 2023-12-14 DIAGNOSIS — Z87891 Personal history of nicotine dependence: Secondary | ICD-10-CM | POA: Diagnosis not present

## 2023-12-14 DIAGNOSIS — E1159 Type 2 diabetes mellitus with other circulatory complications: Secondary | ICD-10-CM | POA: Diagnosis present

## 2023-12-14 DIAGNOSIS — R0602 Shortness of breath: Secondary | ICD-10-CM | POA: Diagnosis present

## 2023-12-14 DIAGNOSIS — Z79899 Other long term (current) drug therapy: Secondary | ICD-10-CM | POA: Diagnosis not present

## 2023-12-14 DIAGNOSIS — J188 Other pneumonia, unspecified organism: Secondary | ICD-10-CM | POA: Diagnosis not present

## 2023-12-14 DIAGNOSIS — E1169 Type 2 diabetes mellitus with other specified complication: Secondary | ICD-10-CM | POA: Diagnosis present

## 2023-12-14 DIAGNOSIS — J9621 Acute and chronic respiratory failure with hypoxia: Secondary | ICD-10-CM | POA: Diagnosis present

## 2023-12-14 DIAGNOSIS — N1831 Chronic kidney disease, stage 3a: Secondary | ICD-10-CM | POA: Diagnosis present

## 2023-12-14 DIAGNOSIS — Z86718 Personal history of other venous thrombosis and embolism: Secondary | ICD-10-CM | POA: Diagnosis not present

## 2023-12-14 DIAGNOSIS — E785 Hyperlipidemia, unspecified: Secondary | ICD-10-CM

## 2023-12-14 DIAGNOSIS — J4489 Other specified chronic obstructive pulmonary disease: Secondary | ICD-10-CM

## 2023-12-14 DIAGNOSIS — B59 Pneumocystosis: Secondary | ICD-10-CM | POA: Diagnosis present

## 2023-12-14 DIAGNOSIS — I5042 Chronic combined systolic (congestive) and diastolic (congestive) heart failure: Secondary | ICD-10-CM | POA: Diagnosis present

## 2023-12-14 DIAGNOSIS — N179 Acute kidney failure, unspecified: Secondary | ICD-10-CM | POA: Diagnosis present

## 2023-12-14 LAB — EXPECTORATED SPUTUM ASSESSMENT W GRAM STAIN, RFLX TO RESP C

## 2023-12-14 LAB — BASIC METABOLIC PANEL WITH GFR
Anion gap: 11 (ref 5–15)
BUN: 23 mg/dL — ABNORMAL HIGH (ref 6–20)
CO2: 24 mmol/L (ref 22–32)
Calcium: 8.9 mg/dL (ref 8.9–10.3)
Chloride: 101 mmol/L (ref 98–111)
Creatinine, Ser: 1.26 mg/dL — ABNORMAL HIGH (ref 0.61–1.24)
GFR, Estimated: >60 mL/min (ref >60)
Glucose, Bld: 196 mg/dL — ABNORMAL HIGH (ref 70–99)
Potassium: 4.2 mmol/L (ref 3.5–5.1)
Sodium: 136 mmol/L (ref 135–145)

## 2023-12-14 LAB — CBC
HCT: 42.6 % (ref 39.0–52.0)
Hemoglobin: 13.9 g/dL (ref 13.0–17.0)
MCH: 31.5 pg (ref 26.0–34.0)
MCHC: 32.6 g/dL (ref 30.0–36.0)
MCV: 96.6 fL (ref 80.0–100.0)
Platelets: 200 K/uL (ref 150–400)
RBC: 4.41 MIL/uL (ref 4.22–5.81)
RDW: 11.5 % (ref 11.5–15.5)
WBC: 9.9 K/uL (ref 4.0–10.5)
nRBC: 0 % (ref 0.0–0.2)

## 2023-12-14 LAB — GLUCOSE, CAPILLARY
Glucose-Capillary: 159 mg/dL — ABNORMAL HIGH (ref 70–99)
Glucose-Capillary: 103 mg/dL — ABNORMAL HIGH (ref 70–99)
Glucose-Capillary: 142 mg/dL — ABNORMAL HIGH (ref 70–99)
Glucose-Capillary: 136 mg/dL — ABNORMAL HIGH (ref 70–99)

## 2023-12-14 MED ORDER — AZITHROMYCIN 500 MG PO TABS
500.0000 mg | ORAL_TABLET | Freq: Every day | ORAL | Status: AC
Start: 2023-12-14 — End: 2023-12-19
  Administered 2023-12-14 – 2023-12-16 (×3): 500 mg via ORAL
  Filled 2023-12-14 (×3): qty 1

## 2023-12-14 NOTE — Progress Notes (Signed)
 [facility]    Date of Admission:  12/13/2023     Total days of antibiotics 2              Reason for Consult: HIV/AIDS, AECOPD    Referring Provider: IM Team  Primary Care Provider: Tobie Gaines, DO   Assessment: [name] is a 53 y.o. male admitted with:   AECOPD Exacerbation -  Bronchiectasis -  He feels improved after antibiotics and is currently on Ceftriaxone  + Azithromycin  (day 2 of abx). Abnormal Chest CT with concern for atypical infectious process. Certainly at risk for MAI - will have him collect AFB sputum and conventional sputum sample (to ensure no pseudomonas) Will see if I can help expedite an appointment with our pulmonary colleagues so we have details to provide him at discharge when he is ready. - Continue supportive care. Inhalers at risk with DDI's given symtuza  on board.  - needs airway clearance hygiene measures  - Would continue IV ceftriaxone  and azithromycin  (change to PO)  - Will reach out to pulm to see if we can get an appt arranged for him in 2 weeks  - AFB sputum ordered   HIV/AIDS -  Good virologic control recently at outpatient follow up but some concern for adherence given notorious difficulty with pharmacies and insurance changing their preferred fill locations.... will work with our pharmacy team to see if we can get more clarification as to where he fills. He Will need to continue Symtuza  and TIvicay  taken together everyday with prophylactic bactrim .   Plan: Continue symtuza  + tivicay  taken together daily with meals  Continue prophylactic bactrim   Follow up routine sputum to ensure no PSA AFB sputum with outpatient follow up if he continues to improve on CAP tx  Ceftriaxone  and Azithromycin  daily     Principal Problem:   Pneumonia Active Problems:   HIV disease ([name])   AIDS ([name])   Chronic systolic CHF (congestive heart failure) ([name])   Type 2 diabetes mellitus with hyperlipidemia ([name])    S/P colostomy ([name])   Bronchiectasis ([name])   COPD (chronic obstructive pulmonary disease) with emphysema ([name])   Adenopathy   Hypertension associated with diabetes ([name])   Acute on chronic hypoxic respiratory failure ([name])   CKD stage 3a, GFR 45-59 ml/min ([name])   Difficulty urinating   Hypercoagulable state    Darunavir -Cobicistat -Emtricitabine -Tenofovir  Alafenamide  1 tablet Oral Q breakfast   dolutegravir   50 mg Oral Daily   enoxaparin  (LOVENOX ) injection  40 mg Subcutaneous Q24H   insulin  aspart  0-15 Units Subcutaneous TID WC   insulin  glargine  25 Units Subcutaneous QHS   sulfamethoxazole -trimethoprim   1 tablet Oral Daily    HPI: [name] is a 53 y.o. male admitted from home   [name] interpretor used to facilitate visit.   He recognizes me from ID clinic. States he had increased sputum production and shortness of breath at home. He is on 1-2 lpm chronic oxygen depending on his breathing quality. He states he has had his medication and all 3 are being taken daily. He has not heard from pulmonology about appt from referral.  Works at [employer].   Saw Dr. Luiz on 9/22 and reported good antiviral adherence and prophylactic bactrim  at that visit with VL 21 copies and CD4 98 (chronically low).    Review of Systems: Review of Systems  Constitutional:  Negative for chills.  Respiratory:  Positive  for cough, sputum production and shortness of breath.   Cardiovascular:  Positive for chest pain.    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   History of tuberculosis 11/01/2020   HIV disease ([name])    MSSA bacteremia    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)    S/P ORIF (open reduction internal fixation) fracture 03/21/2014   Steroid-induced hyperglycemia 09/05/2021   TB (pulmonary tuberculosis)    previously treated according to refugee documentation    Social History   Tobacco Use   Smoking status: Former    Current packs/day: 0.00    Average packs/day: 0.5 packs/day for 6.0 years (3.0 ttl pk-yrs)    Types: Cigarettes    Start date: 03/17/1995    Quit date: 03/16/2001    Years since quitting: 22.7   Smokeless tobacco: Never   Tobacco comments:    quit smoking ~ 2003  Vaping Use   Vaping status: Never Used  Substance Use Topics   Alcohol use: Not Currently    Comment: drinks bottled beer intermittently   Drug use: No    Family History  Problem Relation Age of Onset   Hypertension Other    Heart disease Sister    Allergies  Allergen Reactions   Metformin  And Related Rash    OBJECTIVE: Blood pressure 98/74, pulse 79, temperature 97.7 F (36.5 C), resp. rate 18, height 5' 8 (1.727 m), weight 88.9 kg, SpO2 95%.  Physical Exam Constitutional:      Appearance: Normal appearance. He is not ill-appearing.  HENT:     Head: Normocephalic.     Mouth/Throat:     Mouth: Mucous membranes are moist.     Pharynx: Oropharynx is clear.  Eyes:     General: No scleral icterus. Pulmonary:     Effort: Pulmonary effort is normal.     Breath sounds: Examination of the left-upper field reveals wheezing. Wheezing present.  Musculoskeletal:        General: Normal range of motion.     Cervical back: Normal range of motion.  Skin:    Coloration: Skin is not jaundiced or pale.  Neurological:     Mental Status: He  is alert and oriented to person, place, and time.  Psychiatric:        Mood and Affect: Mood normal.        Judgment: Judgment normal.     Lab Results Lab Results  Component Value Date   WBC 9.9 12/14/2023   HGB 13.9 12/14/2023   HCT 42.6 12/14/2023   MCV 96.6 12/14/2023   PLT 200 12/14/2023    Lab Results  Component Value Date   CREATININE 1.26 (H) 12/14/2023   BUN 23 (H) 12/14/2023   NA 136 12/14/2023   K 4.2 12/14/2023   CL 101 12/14/2023   CO2 24 12/14/2023    Lab Results  Component Value Date   ALT 18 12/13/2023   AST 31 12/13/2023   ALKPHOS 60 12/13/2023   BILITOT 0.8 12/13/2023     Microbiology: Recent Results (from the past 240 hours)  Culture, blood (Routine x 2)     Status: None (Preliminary result)   Collection Time: 12/13/23  4:36 AM   Specimen: BLOOD  Result Value Ref Range Status   Specimen Description BLOOD RIGHT ANTECUBITAL  Final   Special Requests   Final    BOTTLES DRAWN AEROBIC AND ANAEROBIC Blood Culture adequate volume   Culture   Final    NO GROWTH 1 DAY Performed at [facility], [address] [place], [place] [address]    Report Status PENDING  Incomplete  Culture, blood (Routine x 2)     Status: None (Preliminary result)   Collection Time: 12/13/23  4:41 AM   Specimen: BLOOD LEFT HAND  Result Value Ref Range Status   Specimen Description BLOOD LEFT HAND  Final   Special Requests   Final    BOTTLES DRAWN AEROBIC AND ANAEROBIC Blood Culture results may not be optimal due to an inadequate volume of blood received in culture bottles   Culture   Final    NO GROWTH 1 DAY Performed at [facility], [address] [place], [place] [address]    Report Status PENDING  Incomplete  Resp panel by RT-PCR (RSV, Flu A&B, Covid) Anterior Nasal Swab     Status: None   Collection Time: 12/13/23  4:49 AM   Specimen: Anterior Nasal Swab  Result Value Ref Range Status   SARS Coronavirus 2 by RT PCR NEGATIVE NEGATIVE Final   Influenza  A by PCR NEGATIVE NEGATIVE Final   Influenza B by PCR NEGATIVE NEGATIVE Final    Comment: (NOTE) The Xpert Xpress SARS-CoV-2/FLU/RSV plus assay is intended as an aid in the diagnosis of influenza from Nasopharyngeal swab specimens and should not be used as a sole basis for treatment. Nasal washings and aspirates are unacceptable for Xpert Xpress SARS-CoV-2/FLU/RSV testing.  Fact Sheet for Patients: BloggerCourse.com  Fact Sheet for Healthcare Providers: SeriousBroker.it  This test is not yet approved or cleared by the [place]  FDA and has been authorized for detection and/or diagnosis of SARS-CoV-2 by FDA under an Emergency Use Authorization (EUA). This EUA will remain in effect (meaning this test can be used) for the duration of the COVID-19 declaration under Section 564(b)(1) of the Act, 21 U.S.C. section 360bbb-3(b)(1), unless the authorization is terminated or revoked.     Resp Syncytial Virus by PCR NEGATIVE NEGATIVE Final    Comment: (NOTE) Fact Sheet for Patients: BloggerCourse.com  Fact Sheet for Healthcare Providers: SeriousBroker.it  This test is not yet approved or cleared by the [place]  FDA and has been authorized for detection and/or diagnosis of SARS-CoV-2 by FDA under an Emergency Use Authorization (EUA). This EUA will remain in effect (meaning this test can be used) for the duration of the COVID-19 declaration under Section 564(b)(1) of the Act, 21 U.S.C. section 360bbb-3(b)(1), unless the authorization is terminated or revoked.  Performed at [facility], [address], [place], [place] [address]   MRSA Next Gen by PCR, Nasal     Status: None   Collection Time: 12/13/23  4:50 AM   Specimen: Nasal Mucosa; Nasal Swab  Result Value Ref Range Status   MRSA by PCR Next Gen NOT DETECTED NOT DETECTED Final    Comment: (NOTE) The GeneXpert  MRSA  Assay (FDA approved for NASAL specimens only), is one component of a comprehensive MRSA colonization surveillance program. It is not intended to diagnose MRSA infection nor to guide or monitor treatment for MRSA infections. Test performance is not FDA approved in patients less than 46 years old. Performed at [facility], [address], [place], [place] [address]   Respiratory (~20 pathogens) panel by PCR     Status: None   Collection Time: 12/13/23 10:03 AM   Specimen: Nasopharyngeal Swab; Respiratory  Result Value Ref Range Status   Adenovirus NOT DETECTED NOT DETECTED Final   Coronavirus 229E NOT DETECTED NOT DETECTED Final    Comment: (NOTE) The Coronavirus on the Respiratory Panel, DOES NOT test for the novel  Coronavirus (2019 nCoV)    Coronavirus HKU1 NOT DETECTED NOT DETECTED Final   Coronavirus NL63 NOT DETECTED NOT DETECTED Final   Coronavirus OC43 NOT DETECTED NOT DETECTED Final   Metapneumovirus NOT DETECTED NOT DETECTED Final   Rhinovirus / Enterovirus NOT DETECTED NOT DETECTED Final   Influenza A NOT DETECTED NOT DETECTED Final   Influenza B NOT DETECTED NOT DETECTED Final   Parainfluenza Virus 1 NOT DETECTED NOT DETECTED Final   Parainfluenza Virus 2 NOT DETECTED NOT DETECTED Final   Parainfluenza Virus 3 NOT DETECTED NOT DETECTED Final   Parainfluenza Virus 4 NOT DETECTED NOT DETECTED Final   Respiratory Syncytial Virus NOT DETECTED NOT DETECTED Final   Bordetella pertussis NOT DETECTED NOT DETECTED Final   Bordetella Parapertussis NOT DETECTED NOT DETECTED Final   Chlamydophila pneumoniae NOT DETECTED NOT DETECTED Final   Mycoplasma pneumoniae NOT DETECTED NOT DETECTED Final    Comment: Performed at [facility], [address], [place], [place] [address]    Corean Fireman, MSN, NP-C [facility] Pager: [phone]  12/14/2023 2:49 PM    Total Encounter Time: 77m

## 2023-12-14 NOTE — Plan of Care (Signed)

## 2023-12-14 NOTE — Progress Notes (Signed)
 HD#0 SUBJECTIVE:  Patient Summary: [name] is a 53 y.o. with a pertinent PMH of HIV with Aids (CD4 98/12%) on Tivicay  and Symtuza  (with questionable compliance), PE and DVT, HFpEF (EF 55%, 08/2023), T2DM on Insulin , Bronchiectasis, COPD, CKD Stage 3a and S/P Colostomy for large anal condyloma who presented to the ED on 9/29 with cough, SOB and chest pressure and admitted for hypoxic respiratory failure due to pneumonia in an immunocompromised host.  Overnight Events: No acute overnight events  Interim History:  Patient endorses a cough with clear sputum,and states he is feeling better than yesterday. States he no longer has chest pressure. Currently saturating well on 2L 02 [place]. Nurse mentioned patient has been using home albuterol  during inpatient. OBJECTIVE:  Vital Signs: Vitals:   12/13/23 1400 12/13/23 1445 12/13/23 1534 12/13/23 2037  BP: 134/87  133/85 124/72  Pulse: 93 88 91 89  Resp: 20 17 20 18   Temp:    98.6 F (37 C)  TempSrc:    Oral  SpO2: 96% 100% 97%   Weight:      Height:       Supplemental O2: Nasal Cannula SpO2: 97 % O2 Flow Rate (L/min): 4 L/min  Filed Weights   12/13/23 0426  Weight: 88.9 kg     Intake/Output Summary (Last 24 hours) at 12/14/2023 0724 Last data filed at 12/13/2023 1000 Gross per 24 hour  Intake 400 ml  Output 1000 ml  Net -600 ml   Net IO Since Admission: 1,500 mL [12/14/23 0724]  Physical Exam: Physical Exam Eyes:     General: No scleral icterus.    Extraocular Movements: Extraocular movements intact.     Pupils: Pupils are equal, round, and reactive to light.  Cardiovascular:     Rate and Rhythm: Normal rate and regular rhythm.     Heart sounds: No murmur heard. Pulmonary:     Breath sounds: Examination of the right-upper field reveals wheezing. Examination of the left-upper field reveals wheezing. Examination of the right-middle field reveals wheezing. Examination of the left-middle field reveals wheezing.  Examination of the right-lower field reveals wheezing. Examination of the left-lower field reveals wheezing. Wheezing present.     Comments: Breathing unlabored on 4 liter/min via nasal cannula, coarse crackles throughout both lungs, paroxysms of cough noted Abdominal:     Comments: S/P Colostomy  Musculoskeletal:     Right lower leg: No edema.     Left lower leg: No edema.  Neurological:     General: No focal deficit present.     Mental Status: He is alert and oriented to person, place, and time.  Psychiatric:        Behavior: Behavior normal.     Patient Lines/Drains/Airways Status     Active Line/Drains/Airways     Name Placement date Placement time Site Days   Peripheral IV 12/13/23 20 G Anterior;Left;Proximal Forearm 12/13/23  0424  Forearm  1   Peripheral IV 12/13/23 20 G Right Antecubital 12/13/23  0431  Antecubital  1   Colostomy LLQ --  --  LLQ  --            Pertinent labs and imaging:     Latest Ref Rng & Units 12/14/2023    4:50 AM 12/13/2023    4:55 AM 12/13/2023    4:35 AM  CBC  WBC 4.0 - 10.5 K/uL 9.9   9.6   Hemoglobin 13.0 - 17.0 g/dL 86.0  84.3  84.7   Hematocrit 39.0 -  52.0 % 42.6  46.0  45.3   Platelets 150 - 400 K/uL 200   250        Latest Ref Rng & Units 12/14/2023    4:50 AM 12/13/2023    5:13 AM 12/13/2023    4:55 AM  CMP  Glucose 70 - 99 mg/dL 803  821    BUN 6 - 20 mg/dL 23  21    Creatinine 9.38 - 1.24 mg/dL 8.73  8.35    Sodium 864 - 145 mmol/L 136  135  136   Potassium 3.5 - 5.1 mmol/L 4.2  4.5  6.7   Chloride 98 - 111 mmol/L 101  102    CO2 22 - 32 mmol/L 24  25    Calcium  8.9 - 10.3 mg/dL 8.9  8.9    Total Protein 6.5 - 8.1 g/dL  7.6    Total Bilirubin 0.0 - 1.2 mg/dL  0.8    Alkaline Phos 38 - 126 U/L  60    AST 15 - 41 U/L  31    ALT 0 - 44 U/L  18      No results found.  ASSESSMENT/PLAN:  Assessment: [name] is a 53 y.o. with HIV and AIDS, COPD with bronchiectasis, and type 2 diabetes presenting with cough and  dyspnea and admitted for hypoxic respiratory failure due to pneumonia in immunocompromised host.   Principal Problem:   Pneumonia Active Problems:   HIV disease ([name])   AIDS ([name])   Chronic systolic CHF (congestive heart failure) ([name])   Type 2 diabetes mellitus with hyperlipidemia ([name])   S/P colostomy ([name])   Bronchiectasis ([name])   COPD (chronic obstructive pulmonary disease) with emphysema ([name])   Adenopathy   Hypertension associated with diabetes ([name])   Acute on chronic hypoxic respiratory failure ([name])   CKD stage 3a, GFR 45-59 ml/min ([name])   Difficulty urinating   Hypercoagulable state   Plan: #Severe sepsis 2/2 multifocal pneumonia, POA  -Patient initially met Sepsis criteria on admission by HR and RR, no longer meets criteria.  -Continues to have paroxysms of cough with clear sputum  -Differential for CAP includes bacteria, PJP and MAC. RVP was negative. Sputum cultures ordered on 9/29, still pending.  -ABX were narrowed on 9/29 from Vancz/Zosyn to Ceftriaxone  (1 g q24 x 5 days) and Azithromycin  (azithromycin  500 mg q24 x 3 days) given clinical improvement. Bactrim  (800-160 every day) was added for PJP. -ID consult pending  #Acute on Chronic Respiratory Failure, POA -Discharged from hospital in June 2025 on 2 liter/min supplemental home O2. Now requiring 5, but improving since arrival at ED. Wean to 2 liter/min. Ambulatory SpO2 monitoring, treat pneumonia per above.   #HIV with AIDS (CD4 98) #Immunosuppressed state   -Chronic, despite relatively low viral counts CD4 count is low. Covering for PJP pneumonia now. Symtuza  and Tivicay  resumed on 9/29  #HFpEF (EF 55%, 08/2023) -Patient has history of HFrEF which is stable. Patient does not look volume overloaded on physical exam. Continue to monitor clinically.   #T2DM with Hyperlipidemia, POA -POC Glucose ranging from 159 to 253. -Insulin  Glargine 25 units nightly -Moderate SSI  #COPD with Emphysema, POA -No PFT on  record. However, given lack of improvement from patient, COPD exacerbation should be considered.  -Will start patient on scheduled Duonebs  #Bronchiectasis, POA -Chronic, noted on imaging. Raises suspicion of chronic pulmonary infection, perhaps MAC. -aggressive pulmonary toilet  #Adenopathy -Hilar lymphadenopathy on cross-sectional chest imaging. May be related to acute  infection. Follow-up imaging recommended after clinical improvement.   #S/P Colostomy for large anal condyloma -Chronic and stable. Continue to monitor.  #AKI on CKD, POA  #CKD Stage 3a, GFR 45-59 ml/min -Creatinine improved to 1.26 from 1.64. Continue to trend.  #Difficulty urinating -Incidental finding on history, ongoing ~1 month. Check PSA. Bladder scan. Hold anticholinergic medications.   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 Eliquis  recommended at that time but he never started this. High risk of VTE at any rate given acute infection, start chemical prophylaxis but deferring therapeutic dosing for now.   Best Practice: Diet: Regular diet IVF: Fluids: None, Rate: None VTE: enoxaparin  (LOVENOX ) injection 40 mg Start: 12/13/23 1015 Code: Full  Disposition planning: Therapy Recs: None, DME: none Family Contact: Son Aime Mukendi, to be notified. DISPO: Anticipated discharge in <48 hours to Home pending Medication compliance.  Signature:  Ora Schuller MS4 7:24 AM, 12/14/2023

## 2023-12-14 NOTE — Progress Notes (Signed)
 SPO2 95% on 2L nasal cannula at rest.

## 2023-12-14 NOTE — Progress Notes (Signed)
 Oxygen walk test:  SPO2 95% at rest on 2L nasal cannula SPO2 91-92% with exertion on 2L nasal cannula.

## 2023-12-15 ENCOUNTER — Other Ambulatory Visit (HOSPITAL_COMMUNITY): Payer: Self-pay

## 2023-12-15 DIAGNOSIS — B20 Human immunodeficiency virus [HIV] disease: Secondary | ICD-10-CM | POA: Diagnosis not present

## 2023-12-15 DIAGNOSIS — J188 Other pneumonia, unspecified organism: Secondary | ICD-10-CM | POA: Diagnosis not present

## 2023-12-15 DIAGNOSIS — J441 Chronic obstructive pulmonary disease with (acute) exacerbation: Secondary | ICD-10-CM | POA: Diagnosis not present

## 2023-12-15 DIAGNOSIS — J479 Bronchiectasis, uncomplicated: Secondary | ICD-10-CM | POA: Diagnosis not present

## 2023-12-15 DIAGNOSIS — Z87891 Personal history of nicotine dependence: Secondary | ICD-10-CM | POA: Diagnosis not present

## 2023-12-15 DIAGNOSIS — A419 Sepsis, unspecified organism: Secondary | ICD-10-CM | POA: Diagnosis not present

## 2023-12-15 DIAGNOSIS — R6520 Severe sepsis without septic shock: Secondary | ICD-10-CM | POA: Diagnosis not present

## 2023-12-15 LAB — BASIC METABOLIC PANEL WITH GFR
Anion gap: 11 (ref 5–15)
BUN: 27 mg/dL — ABNORMAL HIGH (ref 6–20)
CO2: 30 mmol/L (ref 22–32)
Calcium: 9.2 mg/dL (ref 8.9–10.3)
Chloride: 97 mmol/L — ABNORMAL LOW (ref 98–111)
Creatinine, Ser: 1.31 mg/dL — ABNORMAL HIGH (ref 0.61–1.24)
GFR, Estimated: >60 mL/min (ref >60)
Glucose, Bld: 112 mg/dL — ABNORMAL HIGH (ref 70–99)
Potassium: 4.4 mmol/L (ref 3.5–5.1)
Sodium: 138 mmol/L (ref 135–145)

## 2023-12-15 LAB — GLUCOSE, CAPILLARY
Glucose-Capillary: 105 mg/dL — ABNORMAL HIGH (ref 70–99)
Glucose-Capillary: 136 mg/dL — ABNORMAL HIGH (ref 70–99)
Glucose-Capillary: 128 mg/dL — ABNORMAL HIGH (ref 70–99)
Glucose-Capillary: 114 mg/dL — ABNORMAL HIGH (ref 70–99)

## 2023-12-15 LAB — CBC
HCT: 45.7 % (ref 39.0–52.0)
Hemoglobin: 14.9 g/dL (ref 13.0–17.0)
MCH: 31.7 pg (ref 26.0–34.0)
MCHC: 32.6 g/dL (ref 30.0–36.0)
MCV: 97.2 fL (ref 80.0–100.0)
Platelets: 213 K/uL (ref 150–400)
RBC: 4.7 MIL/uL (ref 4.22–5.81)
RDW: 11.5 % (ref 11.5–15.5)
WBC: 8.1 K/uL (ref 4.0–10.5)
nRBC: 0 % (ref 0.0–0.2)

## 2023-12-15 MED ORDER — GUAIFENESIN ER 600 MG PO TB12
600.0000 mg | ORAL_TABLET | Freq: Two times a day (BID) | ORAL | Status: DC
  Administered 2023-12-15 – 2023-12-16 (×3): 600 mg via ORAL
  Filled 2023-12-15 (×3): qty 1

## 2023-12-15 MED ORDER — SULFAMETHOXAZOLE-TRIMETHOPRIM 800-160 MG PO TABS: 1.0000 | ORAL_TABLET | Freq: Every day | ORAL | 11 refills | Status: DC

## 2023-12-15 MED ORDER — SYMTUZA 800-150-200-10 MG PO TABS: 1.0000 | ORAL_TABLET | Freq: Every day | ORAL | 11 refills | Status: DC

## 2023-12-15 MED ORDER — ALBUTEROL SULFATE (2.5 MG/3ML) 0.083% IN NEBU: 3.0000 mL | INHALATION_SOLUTION | Freq: Four times a day (QID) | RESPIRATORY_TRACT | Status: DC | PRN

## 2023-12-15 MED ORDER — TIVICAY 50 MG PO TABS: 50.0000 mg | ORAL_TABLET | Freq: Every day | ORAL | 11 refills | Status: DC

## 2023-12-15 NOTE — Plan of Care (Signed)
 Problem: Education: Goal: Ability to describe self-care measures that may prevent or decrease complications (Diabetes Survival Skills Education) will improve 12/15/2023 1734 by Jacques Cree D, LPN Outcome: Progressing 12/15/2023 1733 by Jacques Cree D, LPN Outcome: Progressing 12/15/2023 1733 by Jacques Cree D, LPN Outcome: Progressing Goal: Individualized Educational Video(s) Outcome: Progressing   Problem: Coping: Goal: Ability to adjust to condition or change in health will improve 12/15/2023 1734 by Jacques Cree D, LPN Outcome: Progressing 12/15/2023 1733 by Jacques Cree D, LPN Outcome: Progressing 12/15/2023 1733 by Jacques Cree D, LPN Outcome: Progressing   Problem: Fluid Volume: Goal: Ability to maintain a balanced intake and output will improve 12/15/2023 1734 by Jacques Cree D, LPN Outcome: Progressing 12/15/2023 1733 by Jacques Cree D, LPN Outcome: Progressing 12/15/2023 1733 by Jacques Cree D, LPN Outcome: Progressing   Problem: Health Behavior/Discharge Planning: Goal: Ability to identify and utilize available resources and services will improve 12/15/2023 1734 by Jacques Cree D, LPN Outcome: Progressing 12/15/2023 1733 by Jacques Cree D, LPN Outcome: Progressing 12/15/2023 1733 by Jacques Cree D, LPN Outcome: Progressing Goal: Ability to manage health-related needs will improve 12/15/2023 1734 by Jacques Cree D, LPN Outcome: Progressing 12/15/2023 1733 by Jacques Cree D, LPN Outcome: Progressing 12/15/2023 1733 by Jacques Cree D, LPN Outcome: Progressing   Problem: Metabolic: Goal: Ability to maintain appropriate glucose levels will improve 12/15/2023 1734 by Jacques Cree D, LPN Outcome: Progressing 12/15/2023 1733 by Jacques Cree D, LPN Outcome: Progressing 12/15/2023 1733 by Jacques Cree D, LPN Outcome: Progressing   Problem: Nutritional: Goal: Maintenance of adequate nutrition will  improve 12/15/2023 1734 by Jacques Cree D, LPN Outcome: Progressing 12/15/2023 1733 by Jacques Cree D, LPN Outcome: Progressing 12/15/2023 1733 by Jacques Cree D, LPN Outcome: Progressing Goal: Progress toward achieving an optimal weight will improve 12/15/2023 1734 by Jacques Cree D, LPN Outcome: Progressing 12/15/2023 1733 by Jacques Cree D, LPN Outcome: Progressing 12/15/2023 1733 by Jacques Cree D, LPN Outcome: Progressing   Problem: Skin Integrity: Goal: Risk for impaired skin integrity will decrease 12/15/2023 1734 by Jacques Cree D, LPN Outcome: Progressing 12/15/2023 1733 by Jacques Cree D, LPN Outcome: Progressing 12/15/2023 1733 by Jacques Cree D, LPN Outcome: Progressing   Problem: Tissue Perfusion: Goal: Adequacy of tissue perfusion will improve 12/15/2023 1734 by Jacques Cree D, LPN Outcome: Progressing 12/15/2023 1733 by Jacques Cree D, LPN Outcome: Progressing 12/15/2023 1733 by Jacques Cree D, LPN Outcome: Progressing   Problem: Education: Goal: Knowledge of General Education information will improve Description: Including pain rating scale, medication(s)/side effects and non-pharmacologic comfort measures 12/15/2023 1734 by Jacques Cree D, LPN Outcome: Progressing 12/15/2023 1733 by Jacques Cree D, LPN Outcome: Progressing 12/15/2023 1733 by Jacques Cree D, LPN Outcome: Progressing   Problem: Health Behavior/Discharge Planning: Goal: Ability to manage health-related needs will improve 12/15/2023 1734 by Jacques Cree D, LPN Outcome: Progressing 12/15/2023 1733 by Jacques Cree D, LPN Outcome: Progressing 12/15/2023 1733 by Jacques Cree D, LPN Outcome: Progressing   Problem: Clinical Measurements: Goal: Ability to maintain clinical measurements within normal limits will improve 12/15/2023 1734 by Jacques Cree D, LPN Outcome: Progressing 12/15/2023 1733 by Jacques Cree D, LPN Outcome:  Progressing 12/15/2023 1733 by Jacques Cree D, LPN Outcome: Progressing Goal: Will remain free from infection 12/15/2023 1734 by Jacques Cree D, LPN Outcome: Progressing 12/15/2023 1733 by Jacques Cree D, LPN Outcome: Progressing 12/15/2023 1733 by Jacques Cree D, LPN Outcome: Progressing Goal: Diagnostic test results will improve 12/15/2023 1734 by Jacques Cree D, LPN Outcome: Progressing 12/15/2023 1733 by Jacques,  Orine Goga D, LPN Outcome: Progressing 12/15/2023 1733 by Jacques Cree D, LPN Outcome: Progressing Goal: Respiratory complications will improve 12/15/2023 1734 by Jacques Cree D, LPN Outcome: Progressing 12/15/2023 1733 by Jacques Cree D, LPN Outcome: Progressing 12/15/2023 1733 by Jacques Cree D, LPN Outcome: Progressing Goal: Cardiovascular complication will be avoided 12/15/2023 1734 by Jacques Cree D, LPN Outcome: Progressing 12/15/2023 1733 by Jacques Cree D, LPN Outcome: Progressing 12/15/2023 1733 by Jacques Cree D, LPN Outcome: Progressing   Problem: Activity: Goal: Risk for activity intolerance will decrease 12/15/2023 1734 by Jacques Cree D, LPN Outcome: Progressing 12/15/2023 1733 by Jacques Cree D, LPN Outcome: Progressing 12/15/2023 1733 by Jacques Cree D, LPN Outcome: Progressing   Problem: Nutrition: Goal: Adequate nutrition will be maintained 12/15/2023 1734 by Jacques Cree D, LPN Outcome: Progressing 12/15/2023 1733 by Jacques Cree D, LPN Outcome: Progressing 12/15/2023 1733 by Jacques Cree D, LPN Outcome: Progressing   Problem: Coping: Goal: Level of anxiety will decrease 12/15/2023 1734 by Jacques Cree D, LPN Outcome: Progressing 12/15/2023 1733 by Jacques Cree D, LPN Outcome: Progressing 12/15/2023 1733 by Jacques Cree D, LPN Outcome: Progressing   Problem: Elimination: Goal: Will not experience complications related to bowel motility 12/15/2023 1734 by Jacques Cree D, LPN Outcome: Progressing 12/15/2023 1733 by Jacques Cree D, LPN Outcome: Progressing 12/15/2023 1733 by Jacques Cree D, LPN Outcome: Progressing Goal: Will not experience complications related to urinary retention 12/15/2023 1734 by Jacques Cree D, LPN Outcome: Progressing 12/15/2023 1733 by Jacques Cree D, LPN Outcome: Progressing 12/15/2023 1733 by Jacques Cree D, LPN Outcome: Progressing   Problem: Pain Managment: Goal: General experience of comfort will improve and/or be controlled 12/15/2023 1734 by Jacques Cree D, LPN Outcome: Progressing 12/15/2023 1733 by Jacques Cree D, LPN Outcome: Progressing 12/15/2023 1733 by Jacques Cree D, LPN Outcome: Progressing   Problem: Safety: Goal: Ability to remain free from injury will improve 12/15/2023 1734 by Jacques Cree D, LPN Outcome: Progressing 12/15/2023 1733 by Jacques Cree D, LPN Outcome: Progressing 12/15/2023 1733 by Jacques Cree D, LPN Outcome: Progressing   Problem: Skin Integrity: Goal: Risk for impaired skin integrity will decrease 12/15/2023 1734 by Jacques Cree D, LPN Outcome: Progressing 12/15/2023 1733 by Jacques Cree D, LPN Outcome: Progressing 12/15/2023 1733 by Jacques Cree D, LPN Outcome: Progressing

## 2023-12-15 NOTE — Plan of Care (Signed)

## 2023-12-15 NOTE — Progress Notes (Addendum)
 HD#1 SUBJECTIVE:  [name] Summary: [name] is a 53 y.o. 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  Overnight Events: No acute overnight events  Interim History:  [name] states [name] is feeling better compared to yesterday. Continues to endorse cough, but is now a dry cough. Denies short of breath. Saturating 2L O2 [name] which is his baseline. Saturated within COPD goal during ambulation trial.   OBJECTIVE:  Vital Signs: Vitals:   12/14/23 2044 12/14/23 2315 12/15/23 0453 12/15/23 0756  BP: 115/78 118/87 113/84 111/86  Pulse: 89 78 73 75  Resp: 18 18 18 18   Temp: 98 F (36.7 C) 97.7 F (36.5 C) 97.9 F (36.6 C) 97.6 F (36.4 C)  TempSrc: Oral Oral Oral   SpO2: 92% 94% 100% 97%  Weight:      Height:       Supplemental O2: Nasal Cannula SpO2: 97 % O2 Flow Rate (L/min): 2 L/min  Filed Weights   12/13/23 0426  Weight: 88.9 kg    No intake or output data in the 24 hours ending 12/15/23 0903  Net IO Since Admission: 1,500 mL [12/15/23 0903]  Physical Exam: Physical Exam Eyes:     General: No scleral icterus.    Extraocular Movements: Extraocular movements intact.     Pupils: Pupils are equal, round, and reactive to light.  Cardiovascular:     Rate and Rhythm: Normal rate and regular rhythm.     Heart sounds: No murmur heard. Pulmonary:     Breath sounds: Examination of the right-upper field reveals wheezing. Examination of the left-upper field reveals wheezing. Examination of the right-middle field reveals wheezing. Examination of the left-middle field reveals wheezing.  Examination of the right-lower field reveals wheezing. Examination of the left-lower field reveals wheezing. Wheezing present.     Comments: Breathing unlabored on 2 liter/min via nasal cannula, coarse crackles throughout both lungs, paroxysms of cough noted Abdominal:     Tenderness: There is no abdominal tenderness. There is no guarding.     Comments: S/P Colostomy  Musculoskeletal:     Right lower leg: No edema.     Left lower leg: No edema.  Neurological:     General: No focal deficit present.     Mental Status: [name] is alert and oriented to person, place, and time.  Psychiatric:        Behavior: Behavior normal.     [name] Lines/Drains/Airways Status     Active Line/Drains/Airways     Name Placement date Placement time Site Days   Peripheral IV 12/13/23 20 G Anterior;Left;Proximal Forearm 12/13/23  0424  Forearm  1   Peripheral IV 12/13/23 20 G Right Antecubital 12/13/23  0431  Antecubital  1   Colostomy LLQ --  --  LLQ  --            Pertinent labs and imaging:     Latest Ref Rng & Units 12/15/2023    8:04 AM 12/14/2023    4:50 AM 12/13/2023    4:55 AM  CBC  WBC 4.0 - 10.5 K/uL 8.1  9.9    Hemoglobin 13.0 - 17.0 g/dL 14.9  13.9  15.6   Hematocrit 39.0 - 52.0 % 45.7  42.6  46.0   Platelets 150 - 400 K/uL 213  200         Latest Ref Rng & Units 12/15/2023    8:04 AM 12/14/2023    4:50 AM 12/13/2023    5:13 AM  CMP  Glucose 70 - 99 mg/dL 887  803  821   BUN 6 - 20 mg/dL 27  23  21    Creatinine 0.61 - 1.24 mg/dL 8.68  8.73  8.35   Sodium 135 - 145 mmol/L 138  136  135   Potassium 3.5 - 5.1 mmol/L 4.4  4.2  4.5   Chloride 98 - 111 mmol/L 97  101  102   CO2 22 - 32 mmol/L 30  24  25    Calcium  8.9 - 10.3 mg/dL 9.2  8.9  8.9   Total Protein 6.5 - 8.1 g/dL   7.6   Total Bilirubin 0.0 - 1.2 mg/dL   0.8   Alkaline Phos 38 - 126 U/L   60   AST 15 - 41 U/L   31   ALT 0 - 44 U/L   18     No results found.  ASSESSMENT/PLAN:  Assessment: [name] is a 53  y.o. with HIV and AIDS, COPD with bronchiectasis, and type 2 diabetes presenting with cough and dyspnea and admitted for hypoxic respiratory failure due to pneumonia in immunocompromised host.   Principal Problem:   Pneumonia Active Problems:   HIV disease ([name])   AIDS ([name])   Chronic systolic CHF (congestive heart failure) ([name])   Type 2 diabetes mellitus with hyperlipidemia ([name])   S/P colostomy ([name])   Bronchiectasis ([name])   COPD (chronic obstructive pulmonary disease) with emphysema ([name])   Adenopathy   Hypertension associated with diabetes ([name])   Acute on chronic hypoxic respiratory failure ([name])   CKD stage 3a, GFR 45-59 ml/min ([name])   Difficulty urinating   Hypercoagulable state   Plan: #Severe sepsis 2/2 multifocal pneumonia, POA  -[name] initially met Sepsis criteria on admission by HR and RR, no longer meets criteria.  -Afebrile, no leukocytosis. Trend CBC -Continues to have paroxysms of cough with no sputum. Will start [name] on Robitussin. -Conventional Sputum gram stain showed no WBC, few gram negative rods and few gram positive cocci. Cultures ordered and pending.  -AFB ordered and pending -Covering CAP so will continue Ceftriaxone  (1 g q24 Day 2/5) and Azithromycin  (azithromycin  500 mg q24 Day 2/3). Bactrim  (800-160 every day) was added for PJP coverage. -Continue IS and FV -Chest PT ordered per ID recommendations (10/01)   #HIV with AIDS (CD4 98) #Immunosuppressed state   -ID following -Chronic, despite relatively low viral counts CD4 count is low. Covering for PJP pneumonia now. Symtuza  and Tivicay  resumed on 9/29  #HFpEF (EF 55%, 08/2023) -[name] does not look volume overloaded on physical exam. Continue to monitor clinically.   #T2DM with Hyperglycemia, POA -CBGs within goal during admission -Insulin  Glargine 25 units nightly -Moderate SSI  #COPD with Emphysema, POA -No PFT on record, but emphysema noted on imaging.  -Resume home  albuterol   #Bronchiectasis, POA -Chronic, noted on imaging. Raises suspicion of chronic pulmonary infection -aggressive pulmonary toilet  #Adenopathy -Hilar lymphadenopathy on cross-sectional chest imaging. May be related to acute infection. Follow-up imaging recommended after clinical improvement.   #S/P Colostomy for large anal condyloma -Chronic and stable. Continue to monitor.  #AKI on CKD 3a, POA  -AKI  has resolved. -Trend BMP.  #Difficulty urinating -Incidental finding on history, ongoing ~1 month. PSA on 9/29 was 2.10. PVR ordered. Hold anticholinergic medications.   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 Eliquis  recommended at that time but [name] never started this. High risk of VTE at any rate given acute infection, start chemical prophylaxis but deferring therapeutic dosing for now.   #Acute on Chronic Respiratory Failure, POA  -Resolved  Best Practice: Diet: Regular diet IVF: Fluids: None, Rate: None VTE: enoxaparin  (LOVENOX ) injection 40 mg Start: 12/13/23 1015 Code: Full  Disposition planning: Therapy Recs: None, DME: none Family Contact: [name], to be notified. DISPO: Anticipated discharge <24 hours to Home pending Medication compliance.  SignatureBETHA Ora Schuller MS4 9:03 AM, 12/15/2023

## 2023-12-15 NOTE — Progress Notes (Signed)
 Pharmacy: Antimicrobial Stewardship Note  13 YOM with hx HIV on Symtuza  + Tivicay  - already filled x 2 at retail and now insurance requesting mail order. The patient has intermittently received samples from the clinic but will attempt to make the transition to mail order.   Scripts sent to [facility] and insurance information relayed over the phone. Comment added to the patient's profile that he speaks [name] and also added as a note to the pharmacy within the prescription itself - they are aware and it is documented for him.   Provided my call back number so they can let me know if the scripts go through. Will call in the AM if I don't hear back from them by then.  Appreciate Massie Fila also helping to set this up for the patient!  Thank you for allowing pharmacy to be a part of this patient's care.  Almarie Lunger, PharmD, BCPS, BCIDP Infectious Diseases Clinical Pharmacist 12/15/2023 3:32 PM   **Pharmacist phone directory can now be found on [email or web address] ([email or web address]).  Listed under [facility].

## 2023-12-15 NOTE — Progress Notes (Signed)
 [facility]  Date of Admission:  12/13/2023      Total days of antibiotics 4          ASSESSMENT: [name] is a 53 y.o. male admitted from home with:   AECOPD Exacerbation -  Bronchiectasis -  He feels 50% improved after antibiotics and is currently on Ceftriaxone  + Azithromycin  (day 3 of abx). Abnormal Chest CT with concern for atypical infectious process. Certainly at risk for MAI - first of 2 AFBs collected.  Will see if I can help expedite an appointment with our pulmonary colleagues so we have details to provide him at discharge when he is ready. - Continue supportive care. Inhalers at risk with DDI's given symtuza  on board. Appreciate pulmonology helping with FU on 10/16.  - needs airway clearance hygiene measures - will ask for FV and IS to be brought to the bedside.  - Would continue IV ceftriaxone  and azithromycin  --> plan to switch to oral therapy tomorrow for 7-10d - Will reach out to pulm to see if we can get an appt arranged for him in 2 weeks  - AFB sputum collected x 1 - will place a second today.   - FU Culture    HIV/AIDS -  Good virologic control recently at outpatient follow up but some concern for adherence given notorious difficulty with pharmacies and insurance changing their preferred fill locations.... will work with our pharmacy team to see if we can get more clarification as to where he fills. He Will need to continue Symtuza  and TIvicay  taken together everyday with prophylactic bactrim . We will send in a new rx to the preferred pharmacy for fills to help with continuing the mailing process.     PLAN: Pulm appt outpatient arranged 10/16 11:30 AM Please collect a second AFB sputum sample  Will see if we can get pulmonary hygiene tools at the bedside and have him start using them. Chest PT vest may also be helpful for clearance.  Likely D/C home tomorrow   D/W IMT team   Principal Problem:   Pneumonia Active  Problems:   HIV disease ([name])   AIDS ([name])   Chronic systolic CHF (congestive heart failure) ([name])   Type 2 diabetes mellitus with hyperlipidemia ([name])   S/P colostomy ([name])   Bronchiectasis ([name])   COPD (chronic obstructive pulmonary disease) with emphysema ([name])   Adenopathy   Hypertension associated with diabetes ([name])   Acute on chronic hypoxic respiratory failure ([name])   CKD stage 3a, GFR 45-59 ml/min ([name])   Difficulty urinating   Hypercoagulable state    azithromycin   500 mg Oral Daily   Darunavir -Cobicistat -Emtricitabine -Tenofovir  Alafenamide  1 tablet Oral Q breakfast   dolutegravir   50 mg Oral Daily   enoxaparin  (LOVENOX ) injection  40 mg Subcutaneous Q24H   guaiFENesin   600 mg Oral BID   insulin  aspart  0-15 Units Subcutaneous TID WC   insulin  glargine  25 Units Subcutaneous QHS   sulfamethoxazole -trimethoprim   1 tablet Oral Daily    SUBJECTIVE: 50% better   Review of Systems: Review of Systems  Constitutional:  Negative for chills, diaphoresis, fever and malaise/fatigue.  Respiratory:  Positive for cough and sputum production.   Cardiovascular:  Positive for chest pain.    Allergies  Allergen Reactions   Metformin  And Related Rash    OBJECTIVE: Vitals:   12/14/23 2315 12/15/23 0453 12/15/23 0756 12/15/23 1302  BP: 118/87 113/84 111/86 117/79  Pulse: 78 73 75 78  Resp: 18 18 18 17   Temp: 97.7 F (36.5 C) 97.9 F (36.6 C) 97.6 F (36.4 C) 98 F (36.7 C)  TempSrc: Oral Oral    SpO2: 94% 100% 97% 95%  Weight:      Height:       Body mass index is 29.8 kg/m.  Physical Exam Constitutional:      Appearance: He is well-developed. He is not ill-appearing.  Pulmonary:     Effort: Pulmonary effort is normal.     Breath sounds: No decreased breath sounds or wheezing.     Comments: 2 LPM [place] Neurological:     Mental Status: He is alert.     Lab Results Lab Results  Component Value Date   WBC 8.1 12/15/2023   HGB 14.9 12/15/2023   HCT 45.7  12/15/2023   MCV 97.2 12/15/2023   PLT 213 12/15/2023    Lab Results  Component Value Date   CREATININE 1.31 (H) 12/15/2023   BUN 27 (H) 12/15/2023   NA 138 12/15/2023   K 4.4 12/15/2023   CL 97 (L) 12/15/2023   CO2 30 12/15/2023    Lab Results  Component Value Date   ALT 18 12/13/2023   AST 31 12/13/2023   ALKPHOS 60 12/13/2023   BILITOT 0.8 12/13/2023     Microbiology: Recent Results (from the past 240 hours)  Culture, blood (Routine x 2)     Status: None (Preliminary result)   Collection Time: 12/13/23  4:36 AM   Specimen: BLOOD  Result Value Ref Range Status   Specimen Description BLOOD RIGHT ANTECUBITAL  Final   Special Requests   Final    BOTTLES DRAWN AEROBIC AND ANAEROBIC Blood Culture adequate volume   Culture   Final    NO GROWTH 2 DAYS Performed at [facility], [address] [place], [place] [address]    Report Status PENDING  Incomplete  Culture, blood (Routine x 2)     Status: None (Preliminary result)   Collection Time: 12/13/23  4:41 AM   Specimen: BLOOD LEFT HAND  Result Value Ref Range Status   Specimen Description BLOOD LEFT HAND  Final   Special Requests   Final    BOTTLES DRAWN AEROBIC AND ANAEROBIC Blood Culture results may not be optimal due to an inadequate volume of blood received in culture bottles   Culture   Final    NO GROWTH 2 DAYS Performed at [facility], [address], [place], [place] [address]    Report Status PENDING  Incomplete  Resp panel by RT-PCR (RSV, Flu A&B, Covid) Anterior Nasal Swab     Status: None   Collection Time: 12/13/23  4:49 AM   Specimen: Anterior Nasal Swab  Result Value Ref Range Status   SARS Coronavirus 2 by RT PCR NEGATIVE NEGATIVE Final   Influenza A by PCR NEGATIVE NEGATIVE Final   Influenza B by PCR NEGATIVE NEGATIVE Final    Comment: (NOTE) The Xpert Xpress SARS-CoV-2/FLU/RSV plus assay is intended as an aid in the diagnosis of influenza from Nasopharyngeal swab specimens and should  not be used as a sole basis for treatment. Nasal washings and aspirates are unacceptable for Xpert Xpress SARS-CoV-2/FLU/RSV testing.  Fact Sheet for Patients: BloggerCourse.com  Fact Sheet for Healthcare Providers: SeriousBroker.it  This test is not yet approved or cleared by the [place]  FDA and has been authorized for detection and/or diagnosis of SARS-CoV-2 by FDA under an  Emergency Use Authorization (EUA). This EUA will remain in effect (meaning this test can be used) for the duration of the COVID-19 declaration under Section 564(b)(1) of the Act, 21 U.S.C. section 360bbb-3(b)(1), unless the authorization is terminated or revoked.     Resp Syncytial Virus by PCR NEGATIVE NEGATIVE Final    Comment: (NOTE) Fact Sheet for Patients: BloggerCourse.com  Fact Sheet for Healthcare Providers: SeriousBroker.it  This test is not yet approved or cleared by the [place]  FDA and has been authorized for detection and/or diagnosis of SARS-CoV-2 by FDA under an Emergency Use Authorization (EUA). This EUA will remain in effect (meaning this test can be used) for the duration of the COVID-19 declaration under Section 564(b)(1) of the Act, 21 U.S.C. section 360bbb-3(b)(1), unless the authorization is terminated or revoked.  Performed at [facility], [address], [place], [place] [address]   MRSA Next Gen by PCR, Nasal     Status: None   Collection Time: 12/13/23  4:50 AM   Specimen: Nasal Mucosa; Nasal Swab  Result Value Ref Range Status   MRSA by PCR Next Gen NOT DETECTED NOT DETECTED Final    Comment: (NOTE) The GeneXpert MRSA Assay (FDA approved for NASAL specimens only), is one component of a comprehensive MRSA colonization surveillance program. It is not intended to diagnose MRSA infection nor to guide or monitor treatment for MRSA infections. Test performance is  not FDA approved in patients less than 45 years old. Performed at [facility], [address] [place], [place] [address]   Respiratory (~20 pathogens) panel by PCR     Status: None   Collection Time: 12/13/23 10:03 AM   Specimen: Nasopharyngeal Swab; Respiratory  Result Value Ref Range Status   Adenovirus NOT DETECTED NOT DETECTED Final   Coronavirus 229E NOT DETECTED NOT DETECTED Final    Comment: (NOTE) The Coronavirus on the Respiratory Panel, DOES NOT test for the novel  Coronavirus (2019 nCoV)    Coronavirus HKU1 NOT DETECTED NOT DETECTED Final   Coronavirus NL63 NOT DETECTED NOT DETECTED Final   Coronavirus OC43 NOT DETECTED NOT DETECTED Final   Metapneumovirus NOT DETECTED NOT DETECTED Final   Rhinovirus / Enterovirus NOT DETECTED NOT DETECTED Final   Influenza A NOT DETECTED NOT DETECTED Final   Influenza B NOT DETECTED NOT DETECTED Final   Parainfluenza Virus 1 NOT DETECTED NOT DETECTED Final   Parainfluenza Virus 2 NOT DETECTED NOT DETECTED Final   Parainfluenza Virus 3 NOT DETECTED NOT DETECTED Final   Parainfluenza Virus 4 NOT DETECTED NOT DETECTED Final   Respiratory Syncytial Virus NOT DETECTED NOT DETECTED Final   Bordetella pertussis NOT DETECTED NOT DETECTED Final   Bordetella Parapertussis NOT DETECTED NOT DETECTED Final   Chlamydophila pneumoniae NOT DETECTED NOT DETECTED Final   Mycoplasma pneumoniae NOT DETECTED NOT DETECTED Final    Comment: Performed at [facility], [address] [place], [place] [address]  Expectorated Sputum Assessment w Gram Stain, Rflx to Resp Cult     Status: None   Collection Time: 12/13/23 10:06 AM   Specimen: Sputum  Result Value Ref Range Status   Specimen Description SPU  Final   Special Requests NONE  Final   Sputum evaluation   Final    Sputum specimen not acceptable for testing.  Please recollect.   Gram Stain Report Called to,Read Back By and Verified With: RN J. [facility] [id number] @ 1655 FH Performed at [facility], [address] [place], [place] [address]    Report Status 12/14/2023 FINAL  Final  Expectorated Sputum Assessment w Gram Stain, Rflx to Resp Cult     Status: None   Collection Time: 12/14/23  8:53 PM   Specimen: Expectorated Sputum  Result Value Ref Range Status   Specimen Description EXPECTORATED SPUTUM  Final   Special Requests Immunocompromised  Final   Sputum evaluation   Final    Sputum specimen not acceptable for testing.  Please recollect.   Gram Stain Report Called to,Read Back By and Verified With: RN GEANNIE NED  [phone] @ 2334 FH Performed at [facility], [address] [place], [place] [address]    Report Status 12/14/2023 FINAL  Final  Expectorated Sputum Assessment w Gram Stain, Rflx to Resp Cult     Status: None (Preliminary result)   Collection Time: 12/15/23  5:49 AM   Specimen: Expectorated Sputum  Result Value Ref Range Status   Specimen Description EXPECTORATED SPUTUM  Final   Special Requests Immunocompromised  Final   Sputum evaluation   Final    THIS SPECIMEN IS ACCEPTABLE FOR SPUTUM CULTURE Performed at [facility], [address] [place], [place] [address]    Report Status PENDING  Incomplete  Culture, Respiratory w Gram Stain     Status: None (Preliminary result)   Collection Time: 12/15/23  5:49 AM  Result Value Ref Range Status   Specimen Description EXPECTORATED SPUTUM  Final   Special Requests Immunocompromised Reflexed from [id number]  Final   Gram Stain   Final    NO WBC SEEN FEW GRAM NEGATIVE RODS FEW GRAM POSITIVE COCCI Performed at [facility], [address], [place], [place] [address]    Culture PENDING  Incomplete   Report Status PENDING  Incomplete    Corean Fireman, MSN, NP-C [facility] Pager: [phone]  @TODAY @ 2:50 PM   Total Encounter Time: 20 m

## 2023-12-16 ENCOUNTER — Other Ambulatory Visit (HOSPITAL_COMMUNITY): Payer: Self-pay

## 2023-12-16 DIAGNOSIS — Z87891 Personal history of nicotine dependence: Secondary | ICD-10-CM | POA: Diagnosis not present

## 2023-12-16 DIAGNOSIS — R6520 Severe sepsis without septic shock: Secondary | ICD-10-CM | POA: Diagnosis not present

## 2023-12-16 DIAGNOSIS — B20 Human immunodeficiency virus [HIV] disease: Secondary | ICD-10-CM | POA: Diagnosis not present

## 2023-12-16 DIAGNOSIS — J441 Chronic obstructive pulmonary disease with (acute) exacerbation: Secondary | ICD-10-CM | POA: Diagnosis not present

## 2023-12-16 DIAGNOSIS — J188 Other pneumonia, unspecified organism: Secondary | ICD-10-CM | POA: Diagnosis not present

## 2023-12-16 DIAGNOSIS — J479 Bronchiectasis, uncomplicated: Secondary | ICD-10-CM | POA: Diagnosis not present

## 2023-12-16 DIAGNOSIS — A419 Sepsis, unspecified organism: Secondary | ICD-10-CM | POA: Diagnosis not present

## 2023-12-16 LAB — CBC
HCT: 48.4 % (ref 39.0–52.0)
Hemoglobin: 15.4 g/dL (ref 13.0–17.0)
MCH: 31 pg (ref 26.0–34.0)
MCHC: 31.8 g/dL (ref 30.0–36.0)
MCV: 97.6 fL (ref 80.0–100.0)
Platelets: 210 K/uL (ref 150–400)
RBC: 4.96 MIL/uL (ref 4.22–5.81)
RDW: 11.7 % (ref 11.5–15.5)
WBC: 4.6 K/uL (ref 4.0–10.5)
nRBC: 0 % (ref 0.0–0.2)

## 2023-12-16 LAB — FUNGITELL BETA-D-GLUCAN: Fungitell Value:: 76.571 pg/mL

## 2023-12-16 LAB — BASIC METABOLIC PANEL WITH GFR
Anion gap: 10 (ref 5–15)
BUN: 28 mg/dL — ABNORMAL HIGH (ref 6–20)
CO2: 30 mmol/L (ref 22–32)
Calcium: 9.2 mg/dL (ref 8.9–10.3)
Chloride: 99 mmol/L (ref 98–111)
Creatinine, Ser: 1.37 mg/dL — ABNORMAL HIGH (ref 0.61–1.24)
GFR, Estimated: >60 mL/min (ref >60)
Glucose, Bld: 79 mg/dL (ref 70–99)
Potassium: 4.2 mmol/L (ref 3.5–5.1)
Sodium: 139 mmol/L (ref 135–145)

## 2023-12-16 LAB — ACID FAST SMEAR (AFB, MYCOBACTERIA): Acid Fast Smear: NEGATIVE

## 2023-12-16 LAB — GLUCOSE, CAPILLARY
Glucose-Capillary: 70 mg/dL (ref 70–99)
Glucose-Capillary: 110 mg/dL — ABNORMAL HIGH (ref 70–99)

## 2023-12-16 MED ORDER — AMOXICILLIN-POT CLAVULANATE 875-125 MG PO TABS
1.0000 | ORAL_TABLET | Freq: Two times a day (BID) | ORAL | 0 refills | Status: DC
Start: 2023-12-17 — End: 2023-12-16
  Filled 2023-12-16: qty 4, 2d supply, fill #0

## 2023-12-16 MED ORDER — SULFAMETHOXAZOLE-TRIMETHOPRIM 800-160 MG PO TABS: 1.0000 | ORAL_TABLET | Freq: Every day | ORAL | 11 refills | Status: AC

## 2023-12-16 MED ORDER — ALBUTEROL SULFATE HFA 108 (90 BASE) MCG/ACT IN AERS: 2.0000 | INHALATION_SPRAY | Freq: Four times a day (QID) | RESPIRATORY_TRACT | 0 refills | Status: DC | PRN

## 2023-12-16 MED ORDER — BENZONATATE 200 MG PO CAPS: 200.0000 mg | ORAL_CAPSULE | Freq: Three times a day (TID) | ORAL | 0 refills | Status: DC | PRN

## 2023-12-16 MED ORDER — AMOXICILLIN-POT CLAVULANATE 875-125 MG PO TABS: 1.0000 | ORAL_TABLET | Freq: Two times a day (BID) | ORAL | Status: AC

## 2023-12-16 MED ORDER — TIVICAY 50 MG PO TABS: 50.0000 mg | ORAL_TABLET | Freq: Every day | ORAL | 11 refills | Status: AC

## 2023-12-16 MED ORDER — ALBUTEROL SULFATE HFA 108 (90 BASE) MCG/ACT IN AERS
2.0000 | INHALATION_SPRAY | Freq: Four times a day (QID) | RESPIRATORY_TRACT | 0 refills | Status: DC | PRN
  Filled 2023-12-16: qty 6.7, 25d supply, fill #0

## 2023-12-16 MED ORDER — INSULIN GLARGINE 100 UNIT/ML SOLOSTAR PEN: 25.0000 [IU] | PEN_INJECTOR | Freq: Every day | SUBCUTANEOUS | Status: DC

## 2023-12-16 MED ORDER — SYMTUZA 800-150-200-10 MG PO TABS: 1.0000 | ORAL_TABLET | Freq: Every day | ORAL | 11 refills | Status: AC

## 2023-12-16 MED ORDER — BENZONATATE 200 MG PO CAPS
200.0000 mg | ORAL_CAPSULE | Freq: Three times a day (TID) | ORAL | 0 refills | Status: DC | PRN
  Filled 2023-12-16: qty 20, 7d supply, fill #0

## 2023-12-16 NOTE — Plan of Care (Signed)
  Problem: Health Behavior/Discharge Planning: Goal: Ability to manage health-related needs will improve Outcome: Progressing   Problem: Education: Goal: Knowledge of General Education information will improve Description: Including pain rating scale, medication(s)/side effects and non-pharmacologic comfort measures Outcome: Progressing   Problem: Health Behavior/Discharge Planning: Goal: Ability to manage health-related needs will improve Outcome: Progressing   Problem: Activity: Goal: Risk for activity intolerance will decrease Outcome: Progressing

## 2023-12-16 NOTE — Discharge Instructions (Addendum)
 [name],  You were recently admitted to [facility] for hypoxic respiratory failure due to pneumonia. We gave you IV antibiotics, which helped your infection.  Continue taking your home medications with the following changes:  Start taking: Amoxicillin -Clavulanate (Augmentin ) 875-125mg  tablet. Take 1 tablet of Augmentin  twice daily by mouth for 5 days Change how you take: Lantus  insulin . Start taking 25 units of Insulin  Glargine nightly. Do not use Insulin  Aspart. Continue to take: Tylenol  500mg  as needed for pain Albuterol  (Ventolin ) inhaler Rosuvastatin  20mg  tablet nightly Sulfamethoxazole -Trimethoprim  (Bactrim ) 800-160mg  tablet, once daily Symtuza , once daily with breakfast Tivicay  50mg  tablet, once daily with breakfast Stop taking:  Apixaban  (Eliquis ) 2.5mg  tablet.  Humalog  insulin .  Isosorbide  mononitrate (IMDUR ) 30mg  tablet   You should seek further medical care if you develop fevers, shortness of breath, worsening cough or sputum or any sudden worsening or concerning symptoms.  You have an appointment with your primary care doctor on 10/7 at 11:30 AM at the [facility] at the [facility] with Dr Tobie.   We are so glad that you are feeling better.  Sincerely, Ora Schuller, MS4

## 2023-12-16 NOTE — Plan of Care (Signed)
  Problem: Education: Goal: Ability to describe self-care measures that may prevent or decrease complications (Diabetes Survival Skills Education) will improve Outcome: Adequate for Discharge Goal: Individualized Educational Video(s) Outcome: Adequate for Discharge   Problem: Coping: Goal: Ability to adjust to condition or change in health will improve Outcome: Adequate for Discharge   Problem: Fluid Volume: Goal: Ability to maintain a balanced intake and output will improve Outcome: Adequate for Discharge   Problem: Health Behavior/Discharge Planning: Goal: Ability to identify and utilize available resources and services will improve Outcome: Adequate for Discharge Goal: Ability to manage health-related needs will improve Outcome: Adequate for Discharge   Problem: Nutritional: Goal: Maintenance of adequate nutrition will improve Outcome: Adequate for Discharge Goal: Progress toward achieving an optimal weight will improve Outcome: Adequate for Discharge   Problem: Tissue Perfusion: Goal: Adequacy of tissue perfusion will improve Outcome: Adequate for Discharge   Problem: Skin Integrity: Goal: Risk for impaired skin integrity will decrease Outcome: Adequate for Discharge

## 2023-12-16 NOTE — Progress Notes (Signed)
 Pharmacy: Antimicrobial Stewardship Note  29 YOM with hx HIV on Symtuza  + Tivicay  - now insurance requesting mail order.   Scripts were sent to [employer] order - however rejected and requested submission to [employer] order. Re-sent scripts to [facility] and confirmed with their mail order that the scripts did go through and will be mailed to the patient and should arrive in 5-7 days.   The patient stated he had half a bottle of his ART medications at home - should be appropriate to bridge to arrival of mail order prescription.  Thank you for allowing pharmacy to be a part of this patient's care.  Almarie Lunger, PharmD, BCPS, BCIDP Infectious Diseases Clinical Pharmacist 12/16/2023 4:53 PM   **Pharmacist phone directory can now be found on [email or web address] ([email or web address]).  Listed under [facility].

## 2023-12-16 NOTE — Progress Notes (Signed)
 [facility]  Date of Admission:  12/13/2023           ASSESSMENT: The patient is a 53 year old male with history of HIV/AIDS on Symtuza , Tivicay  and prophylactic Bactrim  who presented to the ED with complaints of shortness of breath. According to patient he wears oxygen at home when he feels he needs it and he is currently on 2L [place]. CT of the chest this admission does show bronchiectasis with concern for an atypical infection. The patient does state that he feels better since admission. He was treated with Ceftriaxone  and Azitrhromycin this admission. He is at risk for MAI. Sputum and sputum AFB have been obtained. Referral to Pulmonary was actually placed about a week ago and they will see him on October 16th. The patient states that he feels much better today and is ok with discharge home.    AECOPD Exacerbation -  Bronchiectasis -  He feels much better after antibiotics and is currently on Ceftriaxone  + Azithromycin  (day 4 of abx). Abnormal Chest CT with concern for atypical infectious process. Certainly at risk for MAI - AFB cultures were ordered and obtained x 2   - Continue supportive care. Inhalers at risk with DDI's given Symtuza  on board. Appreciate pulmonology helping with FU on 10/16.  - Would continue IV Ceftriaxone  and Azithromycin   while admitted and if discharged home today, he can go home on Augmentin  875-125mg  PO BID  to complete a 10 day course     HIV/AIDS -  Good virologic control recently at outpatient follow up but some concern for adherence given notorious difficulty with pharmacies and insurance changing their preferred fill locations.... will work with our pharmacy team to see if we can get more clarification as to where he fills. He Will need to continue Symtuza  and TIvicay  taken together everyday with prophylactic bactrim . We will send in a new rx to the preferred pharmacy for fills to help with continuing the mailing process.     Principal  Problem:   Pneumonia Active Problems:   HIV disease ([name])   AIDS ([name])   Chronic systolic CHF (congestive heart failure) ([name])   Type 2 diabetes mellitus with hyperlipidemia ([name])   S/P colostomy ([name])   Bronchiectasis ([name])   COPD (chronic obstructive pulmonary disease) with emphysema ([name])   Adenopathy   Hypertension associated with diabetes ([name])   Acute on chronic hypoxic respiratory failure ([name])   CKD stage 3a, GFR 45-59 ml/min ([name])   Difficulty urinating   Hypercoagulable state   azithromycin   500 mg Oral Daily   Darunavir -Cobicistat -Emtricitabine -Tenofovir  Alafenamide  1 tablet Oral Q breakfast   dolutegravir   50 mg Oral Daily   enoxaparin  (LOVENOX ) injection  40 mg Subcutaneous Q24H   guaiFENesin   600 mg Oral BID   insulin  aspart  0-15 Units Subcutaneous TID WC   insulin  glargine  25 Units Subcutaneous QHS   sulfamethoxazole -trimethoprim   1 tablet Oral Daily   SUBJECTIVE: The patient is lying in bed in no apparent distress. He states that he feels better today and feels ready for discharge. He continues to have a productive cough but he states that it is improving. He remains on 2L [place] but is on 1-2L at home. Pharmacy is working on having all of his medications mailed to his home. No acute events overnight   Review of Systems: Review of Systems  Constitutional:  Negative for chills, diaphoresis, fever and malaise/fatigue.  Respiratory:  Positive for cough  and sputum production.   Cardiovascular:  Negative for chest pain.   Allergies  Allergen Reactions   Metformin  And Related Rash   OBJECTIVE: Vitals:   12/15/23 2207 12/15/23 2208 12/16/23 0545 12/16/23 0839  BP: 103/83 103/83 118/84 111/87  Pulse: 84 81 92 92  Resp:   18 18  Temp:  98.1 F (36.7 C) 97.6 F (36.4 C)   TempSrc:   Oral   SpO2: (!) 89% 94% 96% 96%  Weight:      Height:       Body mass index is 29.8 kg/m.  Physical Exam Constitutional:      Appearance: He is well-developed. He is not  ill-appearing.  Pulmonary:     Effort: Pulmonary effort is normal.     Breath sounds: No decreased breath sounds or wheezing.     Comments: 2 LPM  Neurological:     Mental Status: He is alert.    Lab Results Lab Results  Component Value Date   WBC 4.6 12/16/2023   HGB 15.4 12/16/2023   HCT 48.4 12/16/2023   MCV 97.6 12/16/2023   PLT 210 12/16/2023    Lab Results  Component Value Date   CREATININE 1.37 (H) 12/16/2023   BUN 28 (H) 12/16/2023   NA 139 12/16/2023   K 4.2 12/16/2023   CL 99 12/16/2023   CO2 30 12/16/2023    Lab Results  Component Value Date   ALT 18 12/13/2023   AST 31 12/13/2023   ALKPHOS 60 12/13/2023   BILITOT 0.8 12/13/2023    Microbiology: Recent Results (from the past 240 hours)  Culture, blood (Routine x 2)     Status: None (Preliminary result)   Collection Time: 12/13/23  4:36 AM   Specimen: BLOOD  Result Value Ref Range Status   Specimen Description BLOOD RIGHT ANTECUBITAL  Final   Special Requests   Final    BOTTLES DRAWN AEROBIC AND ANAEROBIC Blood Culture adequate volume   Culture   Final    NO GROWTH 3 DAYS Performed at [facility], [address], [place], [place] [address]    Report Status PENDING  Incomplete  Culture, blood (Routine x 2)     Status: None (Preliminary result)   Collection Time: 12/13/23  4:41 AM   Specimen: BLOOD LEFT HAND  Result Value Ref Range Status   Specimen Description BLOOD LEFT HAND  Final   Special Requests   Final    BOTTLES DRAWN AEROBIC AND ANAEROBIC Blood Culture results may not be optimal due to an inadequate volume of blood received in culture bottles   Culture   Final    NO GROWTH 3 DAYS Performed at [facility], [address], [place], [place] [address]    Report Status PENDING  Incomplete  Resp panel by RT-PCR (RSV, Flu A&B, Covid) Anterior Nasal Swab     Status: None   Collection Time: 12/13/23  4:49 AM   Specimen: Anterior Nasal Swab  Result Value Ref Range Status   SARS  Coronavirus 2 by RT PCR NEGATIVE NEGATIVE Final   Influenza A by PCR NEGATIVE NEGATIVE Final   Influenza B by PCR NEGATIVE NEGATIVE Final    Comment: (NOTE) The Xpert Xpress SARS-CoV-2/FLU/RSV plus assay is intended as an aid in the diagnosis of influenza from Nasopharyngeal swab specimens and should not be used as a sole basis for treatment. Nasal washings and aspirates are unacceptable for Xpert Xpress SARS-CoV-2/FLU/RSV testing.  Fact Sheet for Patients:  BloggerCourse.com  Fact Sheet for Healthcare Providers: SeriousBroker.it  This test is not yet approved or cleared by the [place]  FDA and has been authorized for detection and/or diagnosis of SARS-CoV-2 by FDA under an Emergency Use Authorization (EUA). This EUA will remain in effect (meaning this test can be used) for the duration of the COVID-19 declaration under Section 564(b)(1) of the Act, 21 U.S.C. section 360bbb-3(b)(1), unless the authorization is terminated or revoked.     Resp Syncytial Virus by PCR NEGATIVE NEGATIVE Final    Comment: (NOTE) Fact Sheet for Patients: BloggerCourse.com  Fact Sheet for Healthcare Providers: SeriousBroker.it  This test is not yet approved or cleared by the [place]  FDA and has been authorized for detection and/or diagnosis of SARS-CoV-2 by FDA under an Emergency Use Authorization (EUA). This EUA will remain in effect (meaning this test can be used) for the duration of the COVID-19 declaration under Section 564(b)(1) of the Act, 21 U.S.C. section 360bbb-3(b)(1), unless the authorization is terminated or revoked.  Performed at [facility], [address] [place], [place] [address]   MRSA Next Gen by PCR, Nasal     Status: None   Collection Time: 12/13/23  4:50 AM   Specimen: Nasal Mucosa; Nasal Swab  Result Value Ref Range Status   MRSA by PCR Next Gen NOT  DETECTED NOT DETECTED Final    Comment: (NOTE) The GeneXpert MRSA Assay (FDA approved for NASAL specimens only), is one component of a comprehensive MRSA colonization surveillance program. It is not intended to diagnose MRSA infection nor to guide or monitor treatment for MRSA infections. Test performance is not FDA approved in patients less than 3 years old. Performed at [facility], [address], [place], [place] [address]   Respiratory (~20 pathogens) panel by PCR     Status: None   Collection Time: 12/13/23 10:03 AM   Specimen: Nasopharyngeal Swab; Respiratory  Result Value Ref Range Status   Adenovirus NOT DETECTED NOT DETECTED Final   Coronavirus 229E NOT DETECTED NOT DETECTED Final    Comment: (NOTE) The Coronavirus on the Respiratory Panel, DOES NOT test for the novel  Coronavirus (2019 nCoV)    Coronavirus HKU1 NOT DETECTED NOT DETECTED Final   Coronavirus NL63 NOT DETECTED NOT DETECTED Final   Coronavirus OC43 NOT DETECTED NOT DETECTED Final   Metapneumovirus NOT DETECTED NOT DETECTED Final   Rhinovirus / Enterovirus NOT DETECTED NOT DETECTED Final   Influenza A NOT DETECTED NOT DETECTED Final   Influenza B NOT DETECTED NOT DETECTED Final   Parainfluenza Virus 1 NOT DETECTED NOT DETECTED Final   Parainfluenza Virus 2 NOT DETECTED NOT DETECTED Final   Parainfluenza Virus 3 NOT DETECTED NOT DETECTED Final   Parainfluenza Virus 4 NOT DETECTED NOT DETECTED Final   Respiratory Syncytial Virus NOT DETECTED NOT DETECTED Final   Bordetella pertussis NOT DETECTED NOT DETECTED Final   Bordetella Parapertussis NOT DETECTED NOT DETECTED Final   Chlamydophila pneumoniae NOT DETECTED NOT DETECTED Final   Mycoplasma pneumoniae NOT DETECTED NOT DETECTED Final    Comment: Performed at [facility], [address] [place], [place] [address]  Expectorated Sputum Assessment w Gram Stain, Rflx to Resp Cult     Status: None   Collection Time: 12/13/23 10:06 AM   Specimen:  Sputum  Result Value Ref Range Status   Specimen Description SPU  Final   Special Requests NONE  Final   Sputum evaluation   Final  Sputum specimen not acceptable for testing.  Please recollect.   Gram Stain Report Called to,Read Back By and Verified With: RN J. [facility] [id number] @ 1655 FH Performed at [facility], [address] [place], [place] [address]    Report Status 12/14/2023 FINAL  Final  Expectorated Sputum Assessment w Gram Stain, Rflx to Resp Cult     Status: None   Collection Time: 12/14/23  8:53 PM   Specimen: Expectorated Sputum  Result Value Ref Range Status   Specimen Description EXPECTORATED SPUTUM  Final   Special Requests Immunocompromised  Final   Sputum evaluation   Final    Sputum specimen not acceptable for testing.  Please recollect.   Gram Stain Report Called to,Read Back By and Verified With: RN GEANNIE NED  [phone] @ 2334 FH Performed at [facility], [address], [place], [place] [address]    Report Status 12/14/2023 FINAL  Final  Expectorated Sputum Assessment w Gram Stain, Rflx to Resp Cult     Status: None (Preliminary result)   Collection Time: 12/15/23  5:49 AM   Specimen: Expectorated Sputum  Result Value Ref Range Status   Specimen Description EXPECTORATED SPUTUM  Final   Special Requests Immunocompromised  Final   Sputum evaluation   Final    THIS SPECIMEN IS ACCEPTABLE FOR SPUTUM CULTURE Performed at [facility], [address] [place], [place] [address]    Report Status PENDING  Incomplete  Culture, Respiratory w Gram Stain     Status: None (Preliminary result)   Collection Time: 12/15/23  5:49 AM  Result Value Ref Range Status   Specimen Description EXPECTORATED SPUTUM  Final   Special Requests Immunocompromised Reflexed from [id number]  Final   Gram Stain   Final    NO WBC SEEN FEW GRAM NEGATIVE RODS FEW GRAM POSITIVE COCCI Performed at [facility], [address], [place], [place] [address]    Culture PENDING   Incomplete   Report Status PENDING  Incomplete   Evalene Munch, MD [facility]  12/16/23 9:15 AM

## 2023-12-16 NOTE — Discharge Summary (Addendum)
 Name: [name] MRN: [id number] DOB: 12-12-1970 53 y.o. PCP: Tobie Gaines, DO  Date of Admission: 12/13/2023  4:21 AM Date of Discharge: 12/16/2023  Attending Physician: Dr. Jone Dauphin  Discharge Diagnosis: Principal Problem:   Pneumonia Active Problems:   HIV disease ([name])   AIDS ([name])   Chronic systolic CHF (congestive heart failure) ([name])   Type 2 diabetes mellitus with hyperlipidemia ([name])   S/P colostomy ([name])   Bronchiectasis ([name])   COPD (chronic obstructive pulmonary disease) with emphysema ([name])   Adenopathy   Hypertension associated with diabetes ([name])   Acute on chronic hypoxic respiratory failure ([name])   CKD stage 3a, GFR 45-59 ml/min ([name])   Difficulty urinating   Hypercoagulable state    Discharge Medications: Allergies as of 12/16/2023       Reactions   Metformin  And Related Rash        Medication List     STOP taking these medications    Eliquis  2.5 MG Tabs tablet Generic drug: apixaban    HumaLOG  KwikPen 100 UNIT/ML KwikPen Generic drug: insulin  lispro   isosorbide  mononitrate 30 MG 24 hr tablet Commonly known as: IMDUR        TAKE these medications    acetaminophen  500 MG tablet Commonly known as: TYLENOL  Take 500 mg by mouth 2 (two) times daily as needed for fever or headache (pain).   albuterol  108 (90 Base) MCG/ACT inhaler Commonly known as: VENTOLIN  HFA Inhale 2 puffs into the lungs every 6 (six) hours as needed for wheezing or shortness of breath.   amoxicillin -clavulanate 875-125 MG tablet Commonly known as: AUGMENTIN  Take 1 tablet by mouth 2 (two) times daily for 5 days. Start taking on: December 17, 2023   benzonatate  200 MG capsule Commonly known as: TESSALON  Take 1 capsule (200 mg total) by mouth 3 (three) times daily as needed for cough.   insulin  glargine 100 UNIT/ML Solostar Pen Commonly known as: LANTUS  Inject 25 Units into the skin at bedtime. What changed: how much to take   rosuvastatin  20 MG  tablet Commonly known as: Crestor  Take 1 tablet (20 mg total) by mouth daily.   sulfamethoxazole -trimethoprim  800-160 MG tablet Commonly known as: BACTRIM  DS Take 1 tablet by mouth daily.   Symtuza  800-150-200-10 MG Tabs Generic drug: Darunavir -Cobicistat -Emtricitabine -Tenofovir  Alafenamide Take 1 tablet by mouth daily with breakfast.   TechLite Plus Pen Needles 32G X 4 MM Misc Generic drug: Insulin  Pen Needle Use 4 times a day   Tivicay  50 MG tablet Generic drug: dolutegravir  Take 1 tablet (50 mg total) by mouth daily.        Disposition and follow-up:   [name] was discharged from [facility] in Good condition.  At the hospital follow up visit please address:  1.  Follow-up:  a. Patient to follow up with [facility] Pulmonology clinic on 10/16 at 11:30 AM. Please reinforce importance of appointment and proper adherence to Bactrim  for PJP prophylaxis given recent CAP.  B. Patient started on 5 day course of Augmentin , please sure completion of antibiotic.  C. Please ensure patient has gotten Tivicay  and Symtuza  (ART therapy) and is appropriately taking these medications. May need close follow up with ID with Dr. Luiz.  D. Patient had decrease of Lantus  to 25 units daily. Stopped Humalog  indefinitely. Please check BMP and CBG to monitor glucose. Next A1c due in 1 month.   2.  Labs / imaging needed at time of follow-up: BMP, CBG  3.  Pending labs/ test  needing follow-up: AFB and Conventional Sputum Culture  Follow-up Appointments:  Follow-up Information     [facility] Follow up on 12/30/2023.   Specialty: Pulmonology Why: New patient appt with Dr. Zaida October 16 at 11:30 am Contact information: [address] [place]  [address] [phone] Additional information: [address]  [address]  [place], [place] [address]                Hospital Course by problem  list: [name] is a 53 y.o. with HIV and AIDS, COPD with bronchiectasis, and type 2 diabetes presenting with cough and dyspnea and admitted for hypoxic respiratory failure due to pneumonia in immunocompromised host.    Pneumonia Patient presented to the ED on 9/29 with acute hypoxic respiratory failure. He met sepsis criteria on presentation by heart rate and respiratory rate. Given patient history of HIV with CD4 count of 98 and history of bronchiectasis, his CAP differential included routine bacterial pneumonia, PJP or MAC. Infectious disease service consulted given immunocompromised status and suspicion for atypical infection. Chest X-ray demonstrated patchy airspace disease in the left mid and lower lung. CTA was unremarkable for acute PE and demonstrated mild mediastinal and right hilar lymphopathy and marked circumferential wall thickening with bronchiectasis. Conventional sputum gram stain showed no WBC, few gram negative rods and few gram positive cocci. Convetional and AFB cultures are still pending on 10/2. He was started on Vancz/Zosyn on 9/29 which was narrowed to Ceftriaxone  and Azithromycin  to cover for bacterial pneumonia. Bactrim  was resumed from outpatient to cover for PJP. He completed 3 doses of Azithromycin  inpatient. He completed 3 doses of Ceftriaxone  and was given 10 doses of Augmentin  to be taken as outpatient. Aggressive pulmonary toilet with IS and FV was ordered. Patient has clinically improved, continues to have cough with minimal sputum. He continues to use his albuterol  inhaler. He has a outpatient pulmonology appointment at [facility] on 10/16 at 11:30.   HIV disease ([name])  AIDS ([name]) Patient has a history of HIV with AIDS with a CD4 of 98. He is taking Symtuza  and Tivicay  daily. Medications are being managed by Dr Luiz, his outpatient ID doctor. Patient compliance with medications is questionable which he states is due to issues with insurance and his  pharmacy. Pharmacy worked with patient and he will now be receiving his medications by mail from [facility].   Acute on chronic hypoxic respiratory failure ([name]) Patient home O2 requirements are 2L. On presentation to the ED, patient was on 5L O2 [place]. Was eventually weaned down to 2L O2 [place] by 9/30. Had ambulation trial on 9/30 and maintained his oxygen saturation.   Chronic systolic CHF (congestive heart failure) ([name]) Patient has a history of HFpEF. Stable, patient was not volume overloaded after sepsis-dose fluid bolus.     Type 2 diabetes mellitus with hyperlipidemia ([name]) With mild hyperglycemia on admission. Started insulin  glargine at 25 units nightly and moderate SSI. Given patient glucose levels trending towards lower end of normal, patient was told to take 25 units of Glargine nightly and not use the Aspart. Follow up with outpatient.     S/P colostomy ([name]) Chronic and stable, no complications with ostomy at present.    Bronchiectasis ([name]) Chronic, noted on imaging. Raises suspicion of chronic pulmonary infection, perhaps MAC. Aggressive pulmonary toilet with IS and FV.    COPD (chronic obstructive pulmonary disease) with emphysema ([name]) Without record of formal PFT. No outpatient  maintenance therapy. Needs PFTs outpatient. Low suspicion for COPD exacerbation given clinical improvement on abx.     Adenopathy Hilar lymphadenopathy on cross-sectional chest imaging. May be related to acute infection. Follow-up imaging recommended after clinical improvement.    CKD stage 3a, GFR 45-59 ml/min ([name]) Patient had elevated creatine at 1.64 on admission. Stabilized now around 1.3.     Difficulty urinating Patient mentions he has been having issues with urination for two months. He states he has to strain in order to start and maintain flow. PSA on 9/29 was 2.10. Follow up outpatient.     Hypercoagulable state History of PE in 2017 and age-indeterminate clot in left popliteal vein  discovered during June 2025 hospitalization. Eliquis  recommended at that time but he never started this. High risk of VTE at any rate given acute infection, we started chemical prophylaxis with Lovenox  but deferred therapeutic dosing for now. Patient declines long-term full dose anticoagulation.    Discharge Subjective: Patient states he is feeling better compared to yesterday. Continues to endorse a dry cough, Denies short of breath. Saturating 2L O2 [place] which is his baseline. He is ready for discharge.  Discharge Exam:   BP 119/89 (BP Location: Left Arm)   Pulse 92   Temp 97.6 F (36.4 C) (Oral)   Resp 20   Ht 5' 8 (1.727 m)   Wt 88.9 kg   SpO2 98%   BMI 29.80 kg/m  Constitutional: well-appearing male, continuing to cough HENT: normocephalic atraumatic, mucous membranes moist Eyes: conjunctiva non-erythematous Neck: supple Cardiovascular: regular rate and rhythm, no m/r/g Pulmonary/Chest: Wheezing heard in lower lobes L>R Abdominal: soft, non-tender, non-distended, S/p colostomy Neurological: alert & oriented x 3, 5/5 strength in bilateral upper and lower extremities Skin: warm and dry Psych: Normal behavior  Pertinent Labs, Studies, and Procedures:     Latest Ref Rng & Units 12/16/2023    5:32 AM 12/15/2023    8:04 AM 12/14/2023    4:50 AM  CBC  WBC 4.0 - 10.5 K/uL 4.6  8.1  9.9   Hemoglobin 13.0 - 17.0 g/dL 84.5  85.0  86.0   Hematocrit 39.0 - 52.0 % 48.4  45.7  42.6   Platelets 150 - 400 K/uL 210  213  200        Latest Ref Rng & Units 12/16/2023    5:32 AM 12/15/2023    8:04 AM 12/14/2023    4:50 AM  CMP  Glucose 70 - 99 mg/dL 79  887  803   BUN 6 - 20 mg/dL 28  27  23    Creatinine 0.61 - 1.24 mg/dL 8.62  8.68  8.73   Sodium 135 - 145 mmol/L 139  138  136   Potassium 3.5 - 5.1 mmol/L 4.2  4.4  4.2   Chloride 98 - 111 mmol/L 99  97  101   CO2 22 - 32 mmol/L 30  30  24    Calcium  8.9 - 10.3 mg/dL 9.2  9.2  8.9     CT Angio Chest Pulmonary Embolism (PE) W or WO  Contrast Result Date: 12/13/2023 CLINICAL DATA:  Shortness of breath. EXAM: CT ANGIOGRAPHY CHEST WITH CONTRAST TECHNIQUE: Multidetector CT imaging of the chest was performed using the standard protocol during bolus administration of intravenous contrast. Multiplanar CT image reconstructions and MIPs were obtained to evaluate the vascular anatomy. RADIATION DOSE REDUCTION: This exam was performed according to the departmental dose-optimization program which includes automated exposure control, adjustment of the mA and/or kV  according to patient size and/or use of iterative reconstruction technique. CONTRAST:  75mL OMNIPAQUE  IOHEXOL  350 MG/ML SOLN COMPARISON:  08/16/2023 FINDINGS: Cardiovascular: The heart size is normal. No substantial pericardial effusion. There is no filling defect within the opacified pulmonary arteries to suggest the presence of an acute pulmonary embolus. Mediastinum/Nodes: 13 mm short axis AP window lymph node has increased from 9 mm short axis previously. 12 mm short axis right hilar lymph node evident. The esophagus has normal imaging features. There is no axillary lymphadenopathy. Lungs/Pleura: Centrilobular and paraseptal emphysema evident. Patchy and nodular areas of airspace disease are seen in both lungs with areas of architectural distortion and volume loss, most notably on the left. Marked circumferential wall thickening with bronchiectasis and airway impaction noted lingula and left lower lobe, progressive in the interval. Consolidative airspace disease in the posterior right lung base has improved in the interval. Upper Abdomen: Visualized portion of the upper abdomen shows no acute findings. Musculoskeletal: No worrisome lytic or sclerotic osseous abnormality. Review of the MIP images confirms the above findings. IMPRESSION: 1. No CT evidence for acute pulmonary embolus. 2. Patchy and nodular areas of airspace disease in both lungs with areas of architectural distortion and volume  loss, most notably on the left. Marked circumferential wall thickening with bronchiectasis and airway impaction noted lingula and left lower lobe, progressive in the interval. Imaging features are compatible with multifocal pneumonia superimposed on a background of probable chronic interstitial lung disease. 3. Mild mediastinal and right hilar lymphadenopathy, likely reactive. Follow-up recommended to ensure resolution. 4.  Emphysema (ICD10-J43.9). Electronically Signed   By: Camellia Candle M.D.   On: 12/13/2023 07:55   DG Chest Port 1 View if patient is in a treatment room. Result Date: 12/13/2023 CLINICAL DATA:  Cough and shortness of breath. EXAM: PORTABLE CHEST 1 VIEW COMPARISON:  08/14/2023 FINDINGS: Lungs are hyperexpanded. Interstitial markings are diffusely coarsened with chronic features. Patchy airspace disease in the left mid and lower lung is stable to minimally progressive in the interval. No substantial pleural effusion. Cardiopericardial silhouette is at upper limits of normal for size. No acute bony abnormality. Telemetry leads overlie the chest. IMPRESSION: Patchy airspace disease in the left mid and lower lung is stable to minimally progressive in the interval. Electronically Signed   By: Camellia Candle M.D.   On: 12/13/2023 05:36     Discharge Instructions:   Discharge Instructions      [name], [name] were recently admitted to [facility] for hypoxic respiratory failure due to pneumonia. We gave you IV antibiotics, which helped your infection.  Continue taking your home medications with the following changes:  Start taking: Amoxicillin -Clavulanate (Augmentin ) 875-125mg  tablet. Take 1 tablet of Augmentin  twice daily by mouth for 5 days Change how you take: Lantus  insulin . Start taking 25 units of Insulin  Glargine nightly. Do not use Insulin  Aspart. Continue to take: Tylenol  500mg  as needed for pain Albuterol  (Ventolin ) inhaler Rosuvastatin  20mg  tablet  nightly Sulfamethoxazole -Trimethoprim  (Bactrim ) 800-160mg  tablet, once daily Symtuza , once daily with breakfast Tivicay  50mg  tablet, once daily with breakfast Stop taking:  Apixaban  (Eliquis ) 2.5mg  tablet.  Humalog  insulin .  Isosorbide  mononitrate (IMDUR ) 30mg  tablet   You should seek further medical care if you develop fevers, shortness of breath, worsening cough or sputum or any sudden worsening or concerning symptoms.  You have an appointment with your primary care doctor on 10/7 at 11:30 AM at the Internal Medicine Clinic at the [facility] with Dr Tobie.  We are so glad that you are feeling better.  Sincerely, Ora Schuller, MS4      Signed: Ora Schuller, MS4 12/16/2023, 3:43 PM     Attestation for Student Documentation:  I personally was present and performed or re-performed the history, physical exam and medical decision-making activities of this service and have verified that the service and findings are accurately documented in the student's note.  Ozell Kung MD 12/16/2023, 4:48 PM

## 2023-12-17 LAB — EXPECTORATED SPUTUM ASSESSMENT W GRAM STAIN, RFLX TO RESP C

## 2023-12-17 LAB — CULTURE, RESPIRATORY W GRAM STAIN
Culture: NORMAL
Gram Stain: NONE SEEN

## 2023-12-18 LAB — CULTURE, BLOOD (ROUTINE X 2)
Culture: NO GROWTH
Culture: NO GROWTH
Special Requests: ADEQUATE

## 2023-12-19 LAB — ACID FAST SMEAR (AFB, MYCOBACTERIA): Acid Fast Smear: NEGATIVE

## 2023-12-20 ENCOUNTER — Telehealth: Payer: Self-pay

## 2023-12-20 NOTE — Telephone Encounter (Signed)
 Forms handed to  provider. Also left copy in triage in accordion folder

## 2023-12-20 NOTE — Telephone Encounter (Signed)
 Patient came in to drop off forms for Leave of Absence, requesting to be faxed to [phone] and call when ready for pick up at [phone] . Needing back by 12/22/23. Have placed the forms in Dr. Junior basket in triage.

## 2023-12-21 ENCOUNTER — Inpatient Hospital Stay: Admitting: Student

## 2023-12-21 ENCOUNTER — Ambulatory Visit (INDEPENDENT_AMBULATORY_CARE_PROVIDER_SITE_OTHER): Admitting: Student

## 2023-12-21 ENCOUNTER — Encounter: Payer: Self-pay | Admitting: Student

## 2023-12-21 VITALS — BP 120/83 | HR 100 | Temp 98.0°F | Ht 68.0 in | Wt 191.2 lb

## 2023-12-21 DIAGNOSIS — J189 Pneumonia, unspecified organism: Secondary | ICD-10-CM | POA: Diagnosis not present

## 2023-12-21 DIAGNOSIS — J449 Chronic obstructive pulmonary disease, unspecified: Secondary | ICD-10-CM | POA: Diagnosis not present

## 2023-12-21 DIAGNOSIS — E785 Hyperlipidemia, unspecified: Secondary | ICD-10-CM | POA: Diagnosis not present

## 2023-12-21 DIAGNOSIS — Z87891 Personal history of nicotine dependence: Secondary | ICD-10-CM

## 2023-12-21 DIAGNOSIS — E1169 Type 2 diabetes mellitus with other specified complication: Secondary | ICD-10-CM

## 2023-12-21 DIAGNOSIS — Z794 Long term (current) use of insulin: Secondary | ICD-10-CM

## 2023-12-21 DIAGNOSIS — Z792 Long term (current) use of antibiotics: Secondary | ICD-10-CM

## 2023-12-21 MED ORDER — ALBUTEROL SULFATE HFA 108 (90 BASE) MCG/ACT IN AERS: 2.0000 | INHALATION_SPRAY | Freq: Four times a day (QID) | RESPIRATORY_TRACT | 9 refills | Status: DC | PRN

## 2023-12-21 NOTE — Progress Notes (Signed)
 CC: Hospital follow-up  HPI:  [name] is a 53 y.o. male with past medical history of HIV, heart failure with recovered ejection fraction, hypertension, type 2 diabetes who presents for hospital follow-up appointment.  Please see assessment and plan for full HPI.  Medications: HIV: Symtuza  1 tablet daily, Tivicay  50 mg daily, Bactrim  800-160 mg daily COPD: Albuterol  Hyperlipidemia: Crestor  20 mg daily Diabetes: Lantus  25 units nightly  Past Medical History:  Diagnosis Date   Acute pulmonary embolus ([name]) 02/24/2016   Dx December 2017 taking Eliquis  5mg  BID   Allergy 09/30/2021   Bronchiectasis with acute exacerbation ([name])    Bronchiectasis without acute exacerbation ([name]) 04/22/2021   Depression    stress and depression for any man is common (03/21/2014)   Diabetes mellitus without complication ([name])    DVT (deep venous thrombosis) ([name]) 11/03/2020   Dyspnea    Genital warts 01/04/2017   Hepatitis    I don't know what hepatitis I have   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   History of tuberculosis 11/01/2020   HIV disease ([name])    MSSA bacteremia    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)    S/P ORIF (open reduction internal fixation) fracture 03/21/2014   Steroid-induced hyperglycemia 09/05/2021   TB (pulmonary tuberculosis)    previously treated according to refugee documentation     Current Outpatient Medications:    acetaminophen  (TYLENOL ) 500 MG tablet, Take 500 mg by mouth 2 (two) times daily as needed for fever or headache (pain)., Disp: , Rfl:    albuterol  (VENTOLIN  HFA) 108 (90 Base) MCG/ACT inhaler, Inhale 2 puffs into the lungs every 6 (six) hours as needed for wheezing or shortness of breath., Disp: 6.7 g, Rfl: 0   amoxicillin -clavulanate (AUGMENTIN ) 875-125 MG tablet, Take 1 tablet by mouth 2 (two) times daily for 5 days., Disp: , Rfl:     benzonatate  (TESSALON ) 200 MG capsule, Take 1 capsule (200 mg total) by mouth 3 (three) times daily as needed for cough., Disp: 20 capsule, Rfl: 0   Darunavir -Cobicistat -Emtricitabine -Tenofovir  Alafenamide (SYMTUZA ) 800-150-200-10 MG TABS, Take 1 tablet by mouth daily with breakfast., Disp: 30 tablet, Rfl: 11   dolutegravir  (TIVICAY ) 50 MG tablet, Take 1 tablet (50 mg total) by mouth daily., Disp: 30 tablet, Rfl: 11   insulin  glargine (LANTUS ) 100 UNIT/ML Solostar Pen, Inject 25 Units into the skin at bedtime., Disp: , Rfl:    Insulin  Pen Needle 32G X 4 MM MISC, Use 4 times a day, Disp: 100 each, Rfl: 0   rosuvastatin  (CRESTOR ) 20 MG tablet, Take 1 tablet (20 mg total) by mouth daily. (Patient not taking: Reported on 12/13/2023), Disp: 30 tablet, Rfl: 0   sulfamethoxazole -trimethoprim  (BACTRIM  DS) 800-160 MG tablet, Take 1 tablet by mouth daily., Disp: 30 tablet, Rfl: 11  Review of Systems:   Negative except for what is stated in HPI  Physical Exam:  Vitals:   12/21/23 1048  BP: 120/83  Pulse: 100  Temp: 98 F (36.7 C)  TempSrc: Oral  SpO2: (!) 88%  Weight: 191 lb 3.2 oz (86.7 kg)  Height: 5' 8 (1.727 m)   General: Patient is sitting comfortably in the room  Head: Normocephalic, atraumatic  Cardio: Regular rate and rhythm, no murmurs, rubs or gallops Pulmonary: Clear to ausculation bilaterally with no rales, rhonchi, and crackles   Assessment & Plan:   Assessment & Plan Type 2 diabetes mellitus  with hyperlipidemia [facility]) Patient with past medical history of type 2 diabetes mellitus.  Most recent A1c in July 2025 was 8.5.  His current medication list includes Lantus  25 units nightly.  He reports he has been checking his sugars and they are ranging between 140-180.  He denies any concerns today.  Update urine ACR today.  Plan: - Referred to ophthalmology - Update urine ACR today - Continue Lantus  25 units daily - A1c at next visit in 1 month Chronic obstructive pulmonary  disease, unspecified COPD type ([name]) No acute exacerbation concerns at this time.  Plan: - Refill albuterol  Community acquired pneumonia, unspecified laterality Patient recently admitted from 09/29-10/02.  Patient was diagnosed with pneumonia.  He was started on ceftriaxone  and azithromycin .  He improved during hospitalization and he was discharged on Augmentin .  He is doing well today.  Does report some intermittent shortness of breath.  Otherwise is improving day by day.  He has no concerns today.  He did bring his medications with him.  He is taking his Augmentin .  He has follow-up with pulmonology on 12/30/2023.  He also has follow-up with infectious disease on 03/20/2024.  Plan: - Finish Augmentin  course  Patient discussed with Dr. Shawn Libby Blanch, DO Internal Medicine Resident PGY-3

## 2023-12-21 NOTE — Telephone Encounter (Signed)
 Forms left upfront for the patient.  A copy placed in triage and batch scan as well.  Elman Dettman ONEIDA Ligas, CMA

## 2023-12-21 NOTE — Patient Instructions (Addendum)
 [name],Thank you for allowing me to take part in your care today.  Here are your instructions.  1. Please come back in 1 month for diabetes check  2. Continue to finish your antibiotics    PLEASE BRING YOUR MEDICATIONS TO EVERY APPOINTMENT  Thank you, Dr. Tobie  If you have any other questions please contact the internal medicine clinic at [phone] If it is after hours, please call the [place] hospital at [phone] and then ask the person who picks up for the resident on call.   [name], Asante kwa kuniruhusu kushiriki [place].  Hapa [employer].  1. Tafadhali rudi baada ya mwezi 1 kwa uchunguzi wa kisukari  2. Endelea kumaliza antibiotics yako    TAFADHALI LETA DAWA ZAKO KWA KILA KAZI  Asante, Dk Talor Cheema  Ikiwa una maswali mengine tafadhali wasiliana na kliniki ya dawa za ndani kwa [phone] [place] ni baada ya saa chache, tafadhali piga simu kwa hospitali ya [place] kwa [phone] kisha umwulize mtu ambaye atampokea mkazi kwa simu.

## 2023-12-21 NOTE — Assessment & Plan Note (Signed)
 Patient recently admitted from 09/29-10/02.  Patient was diagnosed with pneumonia.  He was started on ceftriaxone  and azithromycin .  He improved during hospitalization and he was discharged on Augmentin .  He is doing well today.  Does report some intermittent shortness of breath.  Otherwise is improving day by day.  He has no concerns today.  He did bring his medications with him.  He is taking his Augmentin .  He has follow-up with pulmonology on 12/30/2023.  He also has follow-up with infectious disease on 03/20/2024.  Plan: - Finish Augmentin  course

## 2023-12-21 NOTE — Assessment & Plan Note (Signed)
 No acute exacerbation concerns at this time.  Plan: - Refill albuterol 

## 2023-12-21 NOTE — Assessment & Plan Note (Signed)
 Patient with past medical history of type 2 diabetes mellitus.  Most recent A1c in July 2025 was 8.5.  His current medication list includes Lantus  25 units nightly.  He reports he has been checking his sugars and they are ranging between 140-180.  He denies any concerns today.  Update urine ACR today.  Plan: - Referred to ophthalmology - Update urine ACR today - Continue Lantus  25 units daily - A1c at next visit in 1 month

## 2023-12-21 NOTE — Progress Notes (Deleted)
 CC: ***  HPI:  Mr.[name] P Belcastro is a 53 y.o. male with past medical history of HIV, heart failure with recovered ejection fraction, hypertension, type 2 diabetes who presents for hospital follow-up appointment.  Please see assessment and plan for full HPI.  Medications: HIV: Symtuza  1 tablet daily, Tivicay  50 mg daily, Bactrim  800-160 mg daily COPD: Albuterol  Hyperlipidemia: Crestor  20 mg daily Diabetes: Lantus  25 units nightly   Patient recently admitted from 09/29-10/02.  He was discharged on Augmentin . See how he has done Pulmonology 10/16 Infectious disease 01/05  Urine microalbumin creatinine ratio Flu vaccine Ophthalmology  Uptrending creatinine at discharge.  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   History of tuberculosis 11/01/2020   HIV disease ([name])    MSSA bacteremia    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)    S/P ORIF (open reduction internal fixation) fracture 03/21/2014   Steroid-induced hyperglycemia 09/05/2021   TB (pulmonary tuberculosis)    previously treated according to refugee documentation     Current Outpatient Medications:    acetaminophen  (TYLENOL ) 500 MG tablet, Take 500 mg by mouth 2 (two) times daily as needed for fever or headache (pain)., Disp: , Rfl:     albuterol  (VENTOLIN  HFA) 108 (90 Base) MCG/ACT inhaler, Inhale 2 puffs into the lungs every 6 (six) hours as needed for wheezing or shortness of breath., Disp: 6.7 g, Rfl: 0   amoxicillin -clavulanate (AUGMENTIN ) 875-125 MG tablet, Take 1 tablet by mouth 2 (two) times daily for 5 days., Disp: , Rfl:    benzonatate  (TESSALON ) 200 MG capsule, Take 1 capsule (200 mg total) by mouth 3 (three) times daily as needed for cough., Disp: 20 capsule, Rfl: 0   Darunavir -Cobicistat -Emtricitabine -Tenofovir  Alafenamide (SYMTUZA ) 800-150-200-10 MG TABS, Take 1 tablet by mouth daily with breakfast., Disp: 30 tablet, Rfl: 11   dolutegravir  (TIVICAY ) 50 MG tablet, Take 1 tablet (50 mg total) by mouth daily., Disp: 30 tablet, Rfl: 11   insulin  glargine (LANTUS ) 100 UNIT/ML Solostar Pen, Inject 25 Units into the skin at bedtime., Disp: , Rfl:    Insulin  Pen Needle 32G X 4 MM MISC, Use 4 times a day, Disp: 100 each, Rfl: 0   rosuvastatin  (CRESTOR ) 20 MG tablet, Take 1 tablet (20 mg total) by mouth daily. (Patient not taking: Reported on 12/13/2023), Disp: 30 tablet, Rfl: 0   sulfamethoxazole -trimethoprim  (BACTRIM  DS) 800-160 MG tablet, Take 1 tablet by mouth daily., Disp: 30 tablet, Rfl: 11  Review of Systems:  ***  Constitutional: Eye: Respiratory: Cardiovascular: GI: MSK: GU: Skin: Neuro: Endocrine:   Physical Exam:  There were no vitals filed for this visit. *** General: Patient is sitting comfortably in the room  Eyes: Pupils equal and reactive to light, EOM intact  Head: Normocephalic, atraumatic  Neck: Supple, nontender, full range of  motion, No JVD Cardio: Regular rate and rhythm, no murmurs, rubs or gallops. 2+ pulses to bilateral upper and lower extremities  Chest: No chest tenderness Pulmonary: Clear to ausculation bilaterally with no rales, rhonchi, and crackles  Abdomen: Soft, nontender with normoactive bowel sounds with no rebound or guarding  Neuro: Alert and orientated x3. CN II-XII intact.  Sensation intact to upper and lower extremities. 2+ patellar reflex.  Back: No midline tenderness, no step off or deformities noted. No paraspinal muscle tenderness.  Skin: No rashes noted  MSK: 5/5 strength to upper and lower extremities.    Assessment & Plan:   Assessment & Plan Type 2 diabetes mellitus with hyperlipidemia ([name])      Patient {GC/GE:[id number]::discussed with,seen with} Dr. {[id number]::Hlpoonli,Ynqqfjw,Floozw,Wjmzwimj,Tpoopjfd,Cpwrzwu}  Libby Blanch, DO Internal Medicine Resident PGY-3

## 2023-12-21 NOTE — Telephone Encounter (Signed)
 I will also fax a copy

## 2023-12-21 NOTE — Telephone Encounter (Signed)
 Copy faxed to [phone] Lorenda CHRISTELLA Code, RMA

## 2023-12-22 LAB — MICROALBUMIN / CREATININE URINE RATIO
Creatinine, Urine: 83.3 mg/dL
Microalb/Creat Ratio: 11 mg/g{creat} (ref 0–29)
Microalbumin, Urine: 9.4 ug/mL

## 2023-12-22 NOTE — Progress Notes (Signed)
 Internal Medicine Clinic Attending  Case discussed with the resident at the time of the visit.  We reviewed the resident's history and exam and pertinent patient test results.  I agree with the assessment, diagnosis, and plan of care documented in the resident's note.

## 2023-12-23 ENCOUNTER — Ambulatory Visit: Payer: Self-pay | Admitting: Student

## 2023-12-30 ENCOUNTER — Encounter

## 2024-01-03 ENCOUNTER — Telehealth: Payer: Self-pay

## 2024-01-03 NOTE — Telephone Encounter (Signed)
 [place] documents requested for signatures  Case number [id number] Copies in triage also placed in MD box for review/completion.  Faxed forms back to [phone] with notes attached.

## 2024-01-14 NOTE — Telephone Encounter (Signed)
 Received voicemail from [place] with [employer] requesting diagnosis code for [employer] paperwork. Confirmed with Dr. Luiz Dx code is B20.   Called back and spoke with Curly, he requested the paperwork be amended with the Dx code and faxed back. Relayed that diagnosis code is B20 and first date of absence was 12/13/23 with a return to work date of 12/27/23.  Attempted to amend paperwork, there is no question about a diagnosis code. Called [place] back and spoke with Tasha.   Notified Lorenza that there is nowhere on the paperwork that asks for Dx code. She confirmed that nothing else is needed from provider's office and that nothing needs to be re-faxed.   Discussed that paperwork indicates an episode of absence due to hospitalization as well as intermittent leave for treatment follow-up.  Spoke with Tammy, she states that the paperwork cannot say lifelong. They need specific dates and provider can certify for up to one year at a time.   Provider notified via secure chat.  [place] [phone]  Duwaine Lowe, BSN, RN

## 2024-01-23 ENCOUNTER — Emergency Department (HOSPITAL_COMMUNITY)

## 2024-01-23 ENCOUNTER — Other Ambulatory Visit: Payer: Self-pay

## 2024-01-23 ENCOUNTER — Encounter (HOSPITAL_COMMUNITY): Payer: Self-pay | Admitting: Emergency Medicine

## 2024-01-23 ENCOUNTER — Observation Stay (HOSPITAL_COMMUNITY)
Admission: EM | Admit: 2024-01-23 | Discharge: 2024-01-25 | Disposition: A | Discharge status: Home / Self Care | Attending: Infectious Diseases | Admitting: Infectious Diseases

## 2024-01-23 DIAGNOSIS — E785 Hyperlipidemia, unspecified: Secondary | ICD-10-CM | POA: Insufficient documentation

## 2024-01-23 DIAGNOSIS — N179 Acute kidney failure, unspecified: Secondary | ICD-10-CM | POA: Diagnosis not present

## 2024-01-23 DIAGNOSIS — R590 Localized enlarged lymph nodes: Secondary | ICD-10-CM | POA: Insufficient documentation

## 2024-01-23 DIAGNOSIS — D6869 Other thrombophilia: Secondary | ICD-10-CM | POA: Insufficient documentation

## 2024-01-23 DIAGNOSIS — Z79899 Other long term (current) drug therapy: Secondary | ICD-10-CM | POA: Diagnosis not present

## 2024-01-23 DIAGNOSIS — J9621 Acute and chronic respiratory failure with hypoxia: Secondary | ICD-10-CM | POA: Diagnosis not present

## 2024-01-23 DIAGNOSIS — Z792 Long term (current) use of antibiotics: Secondary | ICD-10-CM

## 2024-01-23 DIAGNOSIS — B20 Human immunodeficiency virus [HIV] disease: Secondary | ICD-10-CM | POA: Diagnosis not present

## 2024-01-23 DIAGNOSIS — J441 Chronic obstructive pulmonary disease with (acute) exacerbation: Secondary | ICD-10-CM | POA: Diagnosis not present

## 2024-01-23 DIAGNOSIS — Z7952 Long term (current) use of systemic steroids: Secondary | ICD-10-CM

## 2024-01-23 DIAGNOSIS — E1169 Type 2 diabetes mellitus with other specified complication: Secondary | ICD-10-CM

## 2024-01-23 DIAGNOSIS — N1831 Chronic kidney disease, stage 3a: Secondary | ICD-10-CM | POA: Insufficient documentation

## 2024-01-23 DIAGNOSIS — Z87891 Personal history of nicotine dependence: Secondary | ICD-10-CM

## 2024-01-23 DIAGNOSIS — J449 Chronic obstructive pulmonary disease, unspecified: Secondary | ICD-10-CM | POA: Diagnosis not present

## 2024-01-23 DIAGNOSIS — E7849 Other hyperlipidemia: Secondary | ICD-10-CM

## 2024-01-23 DIAGNOSIS — E1122 Type 2 diabetes mellitus with diabetic chronic kidney disease: Secondary | ICD-10-CM | POA: Diagnosis not present

## 2024-01-23 DIAGNOSIS — Z7982 Long term (current) use of aspirin: Secondary | ICD-10-CM | POA: Diagnosis not present

## 2024-01-23 DIAGNOSIS — Z800 Family history of malignant neoplasm of digestive organs: Secondary | ICD-10-CM

## 2024-01-23 DIAGNOSIS — Z9981 Dependence on supplemental oxygen: Secondary | ICD-10-CM | POA: Diagnosis not present

## 2024-01-23 DIAGNOSIS — R059 Cough, unspecified: Secondary | ICD-10-CM | POA: Diagnosis present

## 2024-01-23 DIAGNOSIS — J9601 Acute respiratory failure with hypoxia: Secondary | ICD-10-CM | POA: Diagnosis not present

## 2024-01-23 DIAGNOSIS — I129 Hypertensive chronic kidney disease with stage 1 through stage 4 chronic kidney disease, or unspecified chronic kidney disease: Secondary | ICD-10-CM | POA: Insufficient documentation

## 2024-01-23 DIAGNOSIS — Z794 Long term (current) use of insulin: Secondary | ICD-10-CM

## 2024-01-23 DIAGNOSIS — Z9049 Acquired absence of other specified parts of digestive tract: Secondary | ICD-10-CM

## 2024-01-23 LAB — CBC WITH DIFFERENTIAL/PLATELET
Abs Immature Granulocytes: 0.01 K/uL (ref 0.00–0.07)
Basophils Absolute: 0 K/uL (ref 0.0–0.1)
Basophils Relative: 1 %
Eosinophils Absolute: 0.3 K/uL (ref 0.0–0.5)
Eosinophils Relative: 6 %
HCT: 52.7 % — ABNORMAL HIGH (ref 39.0–52.0)
Hemoglobin: 16.3 g/dL (ref 13.0–17.0)
Immature Granulocytes: 0 %
Lymphocytes Relative: 44 %
Lymphs Abs: 2.4 K/uL (ref 0.7–4.0)
MCH: 31 pg (ref 26.0–34.0)
MCHC: 30.9 g/dL (ref 30.0–36.0)
MCV: 100.2 fL — ABNORMAL HIGH (ref 80.0–100.0)
Monocytes Absolute: 0.7 K/uL (ref 0.1–1.0)
Monocytes Relative: 13 %
Neutro Abs: 1.9 K/uL (ref 1.7–7.7)
Neutrophils Relative %: 36 %
Platelets: 187 K/uL (ref 150–400)
RBC: 5.26 MIL/uL (ref 4.22–5.81)
RDW: 12.6 % (ref 11.5–15.5)
WBC: 5.3 K/uL (ref 4.0–10.5)
nRBC: 0 % (ref 0.0–0.2)

## 2024-01-23 LAB — RESPIRATORY PANEL BY PCR

## 2024-01-23 LAB — I-STAT CHEM 8, ED
BUN: 25 mg/dL — ABNORMAL HIGH (ref 6–20)
Calcium, Ion: 1.12 mmol/L — ABNORMAL LOW (ref 1.15–1.40)
Chloride: 98 mmol/L (ref 98–111)
Creatinine, Ser: 1.5 mg/dL — ABNORMAL HIGH (ref 0.61–1.24)
Glucose, Bld: 122 mg/dL — ABNORMAL HIGH (ref 70–99)
HCT: 50 % (ref 39.0–52.0)
Hemoglobin: 17 g/dL (ref 13.0–17.0)
Potassium: 4.2 mmol/L (ref 3.5–5.1)
Sodium: 140 mmol/L (ref 135–145)
TCO2: 34 mmol/L — ABNORMAL HIGH (ref 22–32)

## 2024-01-23 LAB — RESP PANEL BY RT-PCR (RSV, FLU A&B, COVID)  RVPGX2
Influenza A by PCR: NEGATIVE
Influenza B by PCR: NEGATIVE
Resp Syncytial Virus by PCR: NEGATIVE
SARS Coronavirus 2 by RT PCR: NEGATIVE

## 2024-01-23 LAB — GLUCOSE, CAPILLARY: Glucose-Capillary: 184 mg/dL — ABNORMAL HIGH (ref 70–99)

## 2024-01-23 LAB — HEMOGLOBIN A1C
Hgb A1c MFr Bld: 6.2 % — ABNORMAL HIGH (ref 4.8–5.6)
Mean Plasma Glucose: 131.24 mg/dL

## 2024-01-23 LAB — I-STAT CG4 LACTIC ACID, ED: Lactic Acid, Venous: 1.2 mmol/L (ref 0.5–1.9)

## 2024-01-23 LAB — BRAIN NATRIURETIC PEPTIDE: B Natriuretic Peptide: 14 pg/mL (ref 0.0–100.0)

## 2024-01-23 MED ORDER — IPRATROPIUM-ALBUTEROL 0.5-2.5 (3) MG/3ML IN SOLN: 3.0000 mL | RESPIRATORY_TRACT | Status: DC | PRN

## 2024-01-23 MED ORDER — IPRATROPIUM-ALBUTEROL 0.5-2.5 (3) MG/3ML IN SOLN: 3.0000 mL | RESPIRATORY_TRACT | Status: DC

## 2024-01-23 MED ORDER — SULFAMETHOXAZOLE-TRIMETHOPRIM 800-160 MG PO TABS
1.0000 | ORAL_TABLET | Freq: Every day | ORAL | Status: DC
  Administered 2024-01-23 – 2024-01-25 (×3): 1 via ORAL
  Filled 2024-01-23 (×3): qty 1

## 2024-01-23 MED ORDER — ROSUVASTATIN CALCIUM 20 MG PO TABS
20.0000 mg | ORAL_TABLET | Freq: Every day | ORAL | Status: DC
  Administered 2024-01-23 – 2024-01-25 (×3): 20 mg via ORAL
  Filled 2024-01-23 (×3): qty 1

## 2024-01-23 MED ORDER — INSULIN GLARGINE-YFGN 100 UNIT/ML ~~LOC~~ SOLN
15.0000 [IU] | Freq: Every day | SUBCUTANEOUS | Status: DC
  Filled 2024-01-23: qty 0.15

## 2024-01-23 MED ORDER — IPRATROPIUM-ALBUTEROL 0.5-2.5 (3) MG/3ML IN SOLN
3.0000 mL | RESPIRATORY_TRACT | Status: DC | PRN
  Administered 2024-01-23: 3 mL via RESPIRATORY_TRACT
  Filled 2024-01-23: qty 3

## 2024-01-23 MED ORDER — GUAIFENESIN 100 MG/5ML PO LIQD
5.0000 mL | Freq: Once | ORAL | Status: AC
  Administered 2024-01-23: 5 mL via ORAL
  Filled 2024-01-23: qty 10

## 2024-01-23 MED ORDER — ENOXAPARIN SODIUM 40 MG/0.4ML IJ SOSY
40.0000 mg | PREFILLED_SYRINGE | INTRAMUSCULAR | Status: DC
  Administered 2024-01-23 – 2024-01-24 (×2): 40 mg via SUBCUTANEOUS
  Filled 2024-01-23 (×2): qty 0.4

## 2024-01-23 MED ORDER — IPRATROPIUM-ALBUTEROL 0.5-2.5 (3) MG/3ML IN SOLN
3.0000 mL | Freq: Once | RESPIRATORY_TRACT | Status: AC
  Administered 2024-01-23: 3 mL via RESPIRATORY_TRACT
  Filled 2024-01-23: qty 3

## 2024-01-23 MED ORDER — INSULIN GLARGINE-YFGN 100 UNIT/ML ~~LOC~~ SOLN
15.0000 [IU] | Freq: Every day | SUBCUTANEOUS | Status: DC
  Administered 2024-01-23 – 2024-01-24 (×2): 15 [IU] via SUBCUTANEOUS
  Filled 2024-01-23 (×3): qty 0.15

## 2024-01-23 MED ORDER — BUDESON-GLYCOPYRROL-FORMOTEROL 160-9-4.8 MCG/ACT IN AERO
2.0000 | INHALATION_SPRAY | Freq: Two times a day (BID) | RESPIRATORY_TRACT | Status: DC
  Administered 2024-01-23 – 2024-01-25 (×4): 2 via RESPIRATORY_TRACT
  Filled 2024-01-23: qty 5.9

## 2024-01-23 MED ORDER — DOLUTEGRAVIR SODIUM 50 MG PO TABS
50.0000 mg | ORAL_TABLET | Freq: Every day | ORAL | Status: DC
  Administered 2024-01-23 – 2024-01-25 (×3): 50 mg via ORAL
  Filled 2024-01-23 (×4): qty 1

## 2024-01-23 MED ORDER — DOXYCYCLINE HYCLATE 100 MG PO TABS
100.0000 mg | ORAL_TABLET | Freq: Two times a day (BID) | ORAL | Status: DC
  Administered 2024-01-23 – 2024-01-25 (×5): 100 mg via ORAL
  Filled 2024-01-23 (×5): qty 1

## 2024-01-23 MED ORDER — ACETAMINOPHEN 500 MG PO TABS
500.0000 mg | ORAL_TABLET | Freq: Two times a day (BID) | ORAL | Status: DC | PRN
Start: 2024-01-23 — End: 2024-01-25

## 2024-01-23 MED ORDER — PREDNISONE 20 MG PO TABS
40.0000 mg | ORAL_TABLET | Freq: Every day | ORAL | Status: DC
  Administered 2024-01-23 – 2024-01-25 (×3): 40 mg via ORAL
  Filled 2024-01-23 (×3): qty 2

## 2024-01-23 MED ORDER — BENZONATATE 100 MG PO CAPS
200.0000 mg | ORAL_CAPSULE | Freq: Three times a day (TID) | ORAL | Status: DC | PRN
  Administered 2024-01-24: 200 mg via ORAL
  Filled 2024-01-23: qty 2

## 2024-01-23 MED ORDER — IPRATROPIUM-ALBUTEROL 0.5-2.5 (3) MG/3ML IN SOLN
3.0000 mL | RESPIRATORY_TRACT | Status: DC
  Administered 2024-01-23: 3 mL via RESPIRATORY_TRACT
  Filled 2024-01-23: qty 3

## 2024-01-23 MED ORDER — ALBUTEROL SULFATE (2.5 MG/3ML) 0.083% IN NEBU
10.0000 mg/h | INHALATION_SOLUTION | Freq: Once | RESPIRATORY_TRACT | Status: AC
  Administered 2024-01-23: 10 mg/h via RESPIRATORY_TRACT
  Filled 2024-01-23: qty 12

## 2024-01-23 MED ORDER — DARUN-COBIC-EMTRICIT-TENOFAF 800-150-200-10 MG PO TABS
1.0000 | ORAL_TABLET | Freq: Every day | ORAL | Status: DC
  Administered 2024-01-23 – 2024-01-25 (×3): 1 via ORAL
  Filled 2024-01-23 (×4): qty 1

## 2024-01-23 NOTE — Consult Note (Signed)
 WOC Nurse ostomy consult note Stoma type/location: LLQ colostomy Patient consult received for ostomy supplies.  Patient is known to our service and supply order is placed as well as orders for ostomy care by the bedside staff.   1-piece pouching system Soila # [phone]) with skin barrier ring Soila # [phone]) is ordered to bedside.   WOC nursing team will not follow, but will remain available to this patient, the nursing and medical teams.  Please re-consult if needed.  [name] [facility], CNS, CWON-AP [phone]

## 2024-01-23 NOTE — Congregational Nurse Program (Signed)
  Dept: [phone]   Congregational Nurse Program Note  Date of Encounter: 01/23/2024  Past Medical History: Past Medical History:  Diagnosis Date   Acute pulmonary embolus ([name]) 02/24/2016   Dx December 2017 taking Eliquis  5mg  BID   Allergy 09/30/2021   Bronchiectasis with acute exacerbation ([name])    Bronchiectasis without acute exacerbation ([name]) 04/22/2021   Depression    stress and depression for any man is common (03/21/2014)   Diabetes mellitus without complication ([name])    DVT (deep venous thrombosis) ([name]) 11/03/2020   Dyspnea    Genital warts 01/04/2017   Hepatitis    I don't know what hepatitis I have   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 Yes    Student Assistance N/A    Location Patient Served  [facility]    Encounter Setting Hospital    Population Status Migrant/Refugee    Insurance Private or [place] Insurance    Insurance/Financial Assistance Referral N/A    Medication Have Medication Insecurities;Patient Medications Reviewed    Medical Provider No    Screening Referrals Made N/A    Medical Referrals Made N/A    Medical Appointment Completed N/A    CNP Interventions Advocate/Support;Navigate Healthcare System;Case Management;Counsel;Educate    Screenings CN Performed N/A    ED Visit Averted N/A    Life-Saving Intervention Made N/A          Patient seen while inpatient to establish care with  Congregational RN.   Patient stated that he is in hospital due to asthma. He denies having Albuterol  at home to use as needed  and reports that he uses  Breztri  inhaler 2 or 3 times a day and often runs out.  Educated that Albuterol  Sulfate is a rescue inhaler and is ordered 2 puffs every 6 hours as needed for wheezing and SOB while Breztri  is a maintenance  inhaler  ordered 2 times daily only.  Patient thankful for the information and I will follow up with him as outpatient for more education and assistance as needed.  Naomie Jonia Oakey RN BSN PCCN  [facility] [phone]-cell [phone]-office

## 2024-01-23 NOTE — Progress Notes (Addendum)
 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 Admitted for acute on chronic hypoxic respiratory failure.  Interim History: Pt was admitted early this morning. Interview limited by lack of a [name] interpreter despite attempts to obtain one via video and auditory methods. Second visit later in the day was done with a remote interpreter over the phone. Pt Reports that he is already feeling better on our exam, that his only issues is his cough and associated shortness of breath, and that he has no other complaints. Would like to treat both the cough and the shortness of breath. Reports that he has been taking his red albuterol  inhaler 3x daily, and his yellow Breztri  inhaler once a day, and that he is running out because he only gets one of each inhaler per month.  OBJECTIVE:  Vital Signs: Vitals:   01/23/24 0945 01/23/24 1030 01/23/24 1125 01/23/24 1226  BP: 129/81 139/87 122/87 (!) 147/96  Pulse:  96 99 100  Resp: (!) 21 (!) 21 18 16   Temp:   98.5 F (36.9 C) 98.1 F (36.7 C)  TempSrc:   Oral Oral  SpO2:  94% 93% 93%  Weight:      Height:       SpO2: 93 % O2 Flow Rate (L/min): 5 L/min  Filed Weights   01/23/24 0114  Weight: 87 kg    No intake or output data in the 24 hours ending 01/23/24 1359 Net IO Since Admission: No IO data has been entered for this period [01/23/24 1359]  Physical Exam: Physical Exam Vitals and nursing note reviewed. Exam conducted with a chaperone present.  Constitutional:      General: He is not in acute distress.    Appearance: He is not toxic-appearing.  Pulmonary:     Effort: Pulmonary effort is normal. No respiratory distress.     Breath sounds: Rhonchi (L worse than R. Improved but still present on 2nd exam today)  present. No wheezing.  Abdominal:     Palpations: Abdomen is soft.     Tenderness: There is no abdominal tenderness.  Skin:    General: Skin is warm and dry.  Neurological:     Mental Status: He is alert.    Patient Lines/Drains/Airways Status     Active Line/Drains/Airways     Name Placement date Placement time Site Days   Peripheral IV 01/23/24 20 G Left Forearm 01/23/24  0102  Forearm  less than 1   Peripheral IV 01/23/24 20 G Right Antecubital 01/23/24  0237  Antecubital  less than 1   Colostomy LLQ --  --  LLQ  --           Pertinent labs and imaging:      Latest Ref Rng & Units 01/23/2024    1:42 AM 01/23/2024    1:34 AM 12/16/2023    5:32 AM  CBC  WBC 4.0 - 10.5 K/uL  5.3  4.6   Hemoglobin 13.0 - 17.0 g/dL 82.9  83.6  84.5   Hematocrit 39.0 - 52.0 % 50.0  52.7  48.4   Platelets 150 - 400 K/uL  187  210        Latest Ref Rng & Units 01/23/2024    1:42 AM 12/16/2023    5:32 AM 12/15/2023  8:04 AM  CMP  Glucose 70 - 99 mg/dL 877  79  887   BUN 6 - 20 mg/dL 25  28  27    Creatinine 0.61 - 1.24 mg/dL 8.49  8.62  8.68   Sodium 135 - 145 mmol/L 140  139  138   Potassium 3.5 - 5.1 mmol/L 4.2  4.2  4.4   Chloride 98 - 111 mmol/L 98  99  97   CO2 22 - 32 mmol/L  30  30   Calcium  8.9 - 10.3 mg/dL  9.2  9.2     DG Chest Port 1 View Result Date: 01/23/2024 EXAM: 1 VIEW(S) XRAY OF THE CHEST 01/23/2024 01:57:39 AM COMPARISON: 12/13/2023 CLINICAL HISTORY: Questionable sepsis - evaluate for abnormality FINDINGS: LUNGS AND PLEURA: Chronic scarring is noted in the left mid and lower lung as well as in the right apex similar to that seen on prior CT. Known emphysematous changes are not as well appreciated. No pleural effusion. HEART AND MEDIASTINUM: No acute abnormality of the cardiac and mediastinal silhouettes. BONES AND SOFT TISSUES: No bony abnormality is noted. IMPRESSION: 1. No acute cardiopulmonary process. 2. Chronic scarring in the left mid and lower lung and right apex,  unchanged. Electronically signed by: Oneil Devonshire MD 01/23/2024 02:01 AM EST RP Workstation: [id number]    ASSESSMENT/PLAN:  Assessment: Active Problems:   Acute on chronic hypoxic respiratory failure ([name]) [name] is a 53 y.o. 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   Plan: #Acute on chronic hypoxic respiratory failure #COPD Home O2 requirement of 2L at night, currently requiring 4-5L [place]. Presented with worsening cough for 2 days. Has had documented emphysematous changes of the lungs but no PFTs to confirm diagnosis of COPD. Currently only taking Albuterol  PRN. CT scans in the past have also been concerning for interstitial lung disease. Physical exam with wheezing despite previous albuterol  treatments, along with rhonchi. Chest x-ray with chronic changes at the left lung base. Due to tachycardia and hypothermia, BC obtained. Patient has reportedly been taking Bactrim . Differential includes COPD exacerbation, ILD exacerbation, CAP, and PJP pneumonia. Favor COPD exacerbation in the setting of his wheezing and reportedly running out of his albuterol  inhaler. CAP seems less likely given his chest x-ray. We will treat for presumptive COPD exacerbation. He does have some edema on physical exam but his BNP is 14, making heart failure less likely. - Prednisone  40mg  daily, final dose 11/12 - Initiate triple therapy with Breztri  - Duonebs q4 as needed - continue PJP prophylaxis with Bactrim  800-160mg  daily - O2 target 88-92%, wean O2 as tolerated - Will require PFTs in the outpatient setting, especially given mixed picture on CT scans. - F/U BC - Started Tessalon  Perles PRN TID for cough.   #CKD 3a #AKI Unclear baseline, as the patient was  discharged in October at Creatinine of 1.37 but had been at 0.98 in July of this year. Creatinine of 1.50 this morning. On exam, patient does have peripheral edema so we will hold off on fluid challenge for now and trend creatinine. Possible that his baseline has shifted. - AM CBC, BMP   #HIV with AIDS Last CD4 count 100 in September. Home regimen of Symtuza  and Tivicay . Will defer repeating CD4 count for now as it  is unlikely to change management, as he is already taking PJP prophylaxis. - Continue Symtuza  - Continue Tivicay  - Continue Bactrim  800-160mg  daily   #Type 2 DM with HLD Last A1C 8.5 in July of 2025. Has been on a regimen of 25 units Lantus  daily at nighttime. Glucose 122 on admission.   - Glargine 15 units starting this evening given unclear PO intake, could potentially increase since he is being given steroids as above - Restarted Rosuvastatin  20mg  daily, patient had not been taking in the outpatient setting   #Adenopathy Hilar lymphadenopathy on cross-sectional chest imaging on previous admission thought to be related to acute infection. Follow-up imaging recommended after clinical improvement.   #Hypercoagulable state History of PE in 2017 and age-indeterminate clot in left popliteal vein discovered during June 2025 hospitalization. Eliquis  recommended at that time but he never started this. Patient declined long-term full dose anticoagulation at previous admission   Best Practice: Diet: Normal VTE: enoxaparin  (LOVENOX ) injection 40 mg Start: 01/23/24 1600 Code: Full  Disposition planning: DISPO: Anticipated discharge in 1-2 days to Home pending clinical improvement.  Signature:  Penne Lera [facility]  1:59 PM, 01/23/2024  On Call pager [phone]

## 2024-01-23 NOTE — H&P (Signed)
 Date: 01/23/2024               Patient Name:  [name] MRN: [id number]  DOB: [birth year] Age / Sex: 53 y.o., male   PCP: Tobie Gaines, DO         Medical Service: Internal Medicine Teaching Service         Attending Physician: Dr. Karna Fellows, MD      First Contact: Viktoria King, DO  Pager:  [phone]  Second Contact: Dr. Damien Lease, DO  Pager:  [phone]       After Hours  (After 5pm / First Contact Pager: [phone]  weekends / holidays): Second Contact Pager: [phone]   SUBJECTIVE   Chief Complaint: Cough  History of Present Illness: [name] is a 53 y.o. male with PMH of HIV/AIDS, COPD with bronchiectasis, Type IIDM, colon cancer S/P resection in 2020, CKD 3a, HfrecoveredEF, history of PE and DVT not on anticoagulation who presented with a chief complaint of cough and shortness of breath.  Recently admitted to IMTS with CAP and acute on chronic respiratory failure, treated with Azithromycin , Ceftriaxone , and Augmentin . Discharged on 2L of O2.   The patient's primary language is [name] and we were unable to obtain a [name] interpreter at the time of the interview despite multiple attempts. The patient does speak some English but the language barrier did limit our conversation and history should be confirmed with an interpreter at a later time.  The patient states that he developed a cough yesterday that has progressively worsened. He has shortness of breath that is increased from his baseline. He reports that he had been using his inhaler up until yesterday, when it ran out. He states that he has been taking his other medications as prescribed. He denies sick contacts. He has been having to use his O2 more frequently, but states that he normally uses 2L at night. He has shortness of breath at rest, but it is worse with activity. He denies chest pain, fevers, chills, sputum production, headache, dizziness, abdominal pain, peripheral swelling.   ED Course: Vitals  were notable for tachycardia to 102, hypothermia to 96.8, and 89% on room air Labs significant for negative Covid/Flu panel, no leukocytosis and mildly elevated Creatinine to 1.5 with unclear baseline but has been as low as 0.98 in July. Chest x-ray showed chronic scarring in the left mid and lower lung and right apex but otherwise no acute abnormalities Received Duonebs, Solumedrol, and 2g MG with EMS, along with continuous albuterol   Meds:  Patient affirms the following medications (although language barrier casts doubt on veracity) Albuterol  inhaler as needed Symtuza  daily with breakfast Tivicay  1 tablet daily Bactrim  800-160mg  daily Lantus  25 units daily at nighttime  Prescribed but not taking Rosuvastatin     Past Medical History Past Medical History:  Diagnosis Date   Acute pulmonary embolus ([name]) 02/24/2016   Dx December 2017 taking Eliquis  5mg  BID   Allergy 09/30/2021   Bronchiectasis with acute exacerbation ([name])    Bronchiectasis without acute exacerbation ([name]) 04/22/2021   Depression    stress and depression for any man is common (03/21/2014)   Diabetes mellitus without complication ([name])    DVT (deep venous thrombosis) ([name]) 11/03/2020   Dyspnea    Genital warts 01/04/2017   Hepatitis    I don't know what hepatitis I have   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   History of tuberculosis 11/01/2020   HIV disease ([name])    MSSA bacteremia  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)    S/P ORIF (open reduction internal fixation) fracture 03/21/2014   Steroid-induced hyperglycemia 09/05/2021   TB (pulmonary tuberculosis)    previously treated according to refugee documentation    Past Surgical History Past Surgical History:  Procedure Laterality Date   BRONCHIAL WASHINGS Bilateral 04/23/2021   Procedure: BRONCHIAL WASHINGS;  Surgeon: Claudene Toribio BROCKS, MD;   Location: [facility] ENDOSCOPY;  Service: Pulmonary;  Laterality: Bilateral;   FRACTURE SURGERY     IM NAILING TIBIA Right 03/21/2014   ORIF ANKLE FRACTURE Right 03/21/2014   lateral malleolus/notes 03/21/2014   ORIF ANKLE FRACTURE Right 03/21/2014   Procedure: OPEN REDUCTION INTERNAL FIXATION (ORIF) pilon ;  Surgeon: Oneil BROCKS Herald, MD;  Location: [facility] OR;  Service: Orthopedics;  Laterality: Right;   TEE WITHOUT CARDIOVERSION N/A 05/23/2019   Procedure: TRANSESOPHAGEAL ECHOCARDIOGRAM (TEE);  Surgeon: Raford Riggs, MD;  Location: [facility] ENDOSCOPY;  Service: Cardiovascular;  Laterality: N/A;   TIBIA IM NAIL INSERTION Right 03/21/2014   Procedure: INTRAMEDULLARY (IM) NAIL TIBIAL;  Surgeon: Oneil BROCKS Herald, MD;  Location: [facility] OR;  Service: Orthopedics;  Laterality: Right;   VIDEO BRONCHOSCOPY Bilateral 03/02/2016   Procedure: VIDEO BRONCHOSCOPY WITHOUT FLUORO;  Surgeon: Tonnie FORBES Flicker, MD;  Location: [facility] ENDOSCOPY;  Service: Cardiopulmonary;  Laterality: Bilateral;   VIDEO BRONCHOSCOPY Left 04/23/2021   Procedure: VIDEO BRONCHOSCOPY WITHOUT FLUORO;  Surgeon: Claudene Toribio BROCKS, MD;  Location: [facility] ENDOSCOPY;  Service: Pulmonary;  Laterality: Left;    Social:  Lives With: Alone Occupation: Nature Conservation Officer: Not a lot of social support Level of Function: Independent PCP: Tobie Gaines, DO Substances: -Tobacco: Remote history of tobacco use. Per chart review stopped 20 years ago and only smoked for about 3 pack years -Alcohol: None -Recreational Drug: None  Family History:  Family History  Problem Relation Age of Onset   Hypertension Other    Heart disease Sister     Allergies: Allergies as of 01/23/2024 - Review Complete 01/23/2024  Allergen Reaction Noted   Metformin  and related Rash 02/27/2021    Review of Systems: A complete ROS was negative except as per HPI.   OBJECTIVE:   Physical Exam: Blood pressure (!) 142/93, pulse (!) 106, temperature (!) 96.8 F (36 C), temperature source Temporal, resp. rate  (!) 25, height 5' 8 (1.727 m), weight 87 kg, SpO2 96%.  Constitutional: alert, sitting in bed, in no acute distress HENT: normocephalic atraumatic, mucous membranes moist Eyes: conjunctiva non-erythematous Neck: No cervical adenopathy Cardiovascular: Tachycardia with regular rhythm. No m/r/g. 1+ pitting edema bilaterally. 2+ peripheral pulses. Pulmonary/Chest: normal work of breathing on 5L [place]. Initially with wheezing in all lung fields, after patient coughed, rhonchi became more prevalent and wheezing resolved. Abdominal: bowel sounds present, soft, non-tender, non-distended. MSK: normal bulk and tone Neurological: alert & oriented x 3 Skin: warm and dry  Labs: CBC    Component Value Date/Time   WBC 5.3 01/23/2024 0134   RBC 5.26 01/23/2024 0134   HGB 17.0 01/23/2024 0142   HGB 16.1 09/03/2021 1215   HCT 50.0 01/23/2024 0142   HCT 47.3 09/03/2021 1215   PLT 187 01/23/2024 0134   PLT 266 09/03/2021 1215   MCV 100.2 (H) 01/23/2024 0134   MCV 98 (H) 09/03/2021 1215   MCH 31.0 01/23/2024 0134   MCHC 30.9 01/23/2024 0134   RDW 12.6 01/23/2024 0134   RDW 11.9 09/03/2021 1215   LYMPHSABS 2.4  01/23/2024 0134   MONOABS 0.7 01/23/2024 0134   EOSABS 0.3 01/23/2024 0134   BASOSABS 0.0 01/23/2024 0134     CMP     Component Value Date/Time   NA 140 01/23/2024 0142   NA 139 04/21/2023 1034   K 4.2 01/23/2024 0142   CL 98 01/23/2024 0142   CO2 30 12/16/2023 0532   GLUCOSE 122 (H) 01/23/2024 0142   BUN 25 (H) 01/23/2024 0142   BUN 16 04/21/2023 1034   CREATININE 1.50 (H) 01/23/2024 0142   CREATININE 1.37 (H) 12/06/2023 0913   CALCIUM  9.2 12/16/2023 0532   PROT 7.6 12/13/2023 0513   ALBUMIN 2.9 (L) 12/13/2023 0513   AST 31 12/13/2023 0513   ALT 18 12/13/2023 0513   ALKPHOS 60 12/13/2023 0513   BILITOT 0.8 12/13/2023 0513   GFRNONAA >60 12/16/2023 0532   GFRNONAA 68 12/04/2019 1118   GFRAA 79 12/04/2019 1118     Imaging:  DG Chest Port 1 View EXAM: 1 VIEW(S) XRAY OF  THE CHEST 01/23/2024 01:57:39 AM  COMPARISON: 12/13/2023  CLINICAL HISTORY: Questionable sepsis - evaluate for abnormality  FINDINGS:  LUNGS AND PLEURA: Chronic scarring is noted in the left mid and lower lung as well as in the right apex similar to that seen on prior CT. Known emphysematous changes are not as well appreciated. No pleural effusion.  HEART AND MEDIASTINUM: No acute abnormality of the cardiac and mediastinal silhouettes.  BONES AND SOFT TISSUES: No bony abnormality is noted.  IMPRESSION: 1. No acute cardiopulmonary process. 2. Chronic scarring in the left mid and lower lung and right apex, unchanged.  Electronically signed by: Oneil Devonshire MD 01/23/2024 02:01 AM EST RP Workstation: [id number]   ASSESSMENT & PLAN:   Assessment & Plan by Problem: Active Problems:   Acute on chronic hypoxic respiratory failure ([name])   [name] is a 53 y.o. 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  #Acute on chronic hypoxic respiratory failure #COPD Home O2 requirement of 2L at night, currently requiring 5L [place]. Presented with worsening cough for 2 days. Has had documented emphysematous changes of the lungs but no PFTs to confirm diagnosis of COPD. Currently only taking Albuterol  PRN. CT scans in the past have also been concerning for interstitial lung disease. Physical exam with wheezing despite previous albuterol  treatments, along with rhonchi. Chest x-ray with chronic changes at the left lung base. Due to tachycardia and hypothermia, BC obtained. Patient has reportedly been taking Bactrim . Differential includes COPD exacerbation, ILD exacerbation, CAP, and PJP pneumonia. Favor COPD exacerbation in the setting of his  wheezing and reportedly running out of his albuterol  inhaler. CAP seems less likely given his chest x-ray. We will treat for presumptive COPD exacerbation. He does have some edema on physical exam but his BNP is 14, making heart failure less likely.  -Prednisone  40mg  daily, last dose 11/12 -Initiate triple therapy with Breztri  -Duonebs q4 as needed -continue PJP prophylaxis with Bactrim  800-160mg  daily -O2 target 88-92%, wean O2 as tolerated -Will require PFTs in the outpatient setting, especially given mixed picture on CT scans. -F/U [facility] -Consider addition of Benzonatate  Per[place] if symptoms fail to improve with above treatment  #CKD 3a #AKI Unclear baseline, as the patient was discharged in October at Creatinine of 1.37  but had been at 0.98 in July of this year. Creatinine of 1.50 this morning. On exam, patient does have peripheral edema so we will hold off on fluid challenge for now and trend creatinine. Possible that his baseline has shifted. -BMP this morning  #HIV with AIDS Last CD4 count 100 in September. Home regimen of Symtuza  and Tivicay . Will defer repeating CD4 count for now as it is unlikely to change management, as he is already taking PJP prophylaxis.  -Continue Symtuza  -Continue Tivicay  -Continue Bactrim  800-160mg  daily  #Type 2 DM with HLD Last A1C 8.5 in July of 2025. Has been on a regimen of 25 units Lantus  daily at nighttime. Glucose 122 on admission.   -Glargine 15 units starting this evening given unclear PO intake, could potentially increase since he is being given steroids as above -Restart Rosuvastatin  20mg  daily, patient had not been taking in the outpatient setting  #Adenopathy Hilar lymphadenopathy on cross-sectional chest imaging on previous admission thought to be related to acute infection. Follow-up imaging recommended after clinical improvement.  #Hypercoagulable state History of PE in 2017 and age-indeterminate clot in left popliteal vein discovered  during June 2025 hospitalization. Eliquis  recommended at that time but he never started this. Patient declined long-term full dose anticoagulation at previous admission  Diet: Normal VTE: Enoxaparin  Code: Full Surrogate Decision Maker: Will need to ask with interpreter, patient did not understand question  Prior to Admission Living Arrangement: Home, living alone Anticipated Discharge Location: Home Barriers to Discharge: Increased O2 status from baseline  Dispo: Admit patient to Observation with expected length of stay less than 2 midnights.  Signed: Napoleon Limes, MD Internal Medicine Resident PGY-1 01/23/2024, 5:20 AM   Please contact IM Residency On-Call Pager at: [phone] or [phone].

## 2024-01-23 NOTE — ED Provider Notes (Signed)
 [place] EMERGENCY DEPARTMENT AT [facility] Provider Note   CSN: [id number] Arrival date & time: 01/23/24  [id number]     Patient presents with: Shortness of Breath  Level 5 caveat due to acuity of condition [name] is a 53 y.o. male.   The history is provided by the patient. The history is limited by a language barrier. A language interpreter was used (swahili).  Patient with extensive history including diabetes, previous PE, Advanced HIV, COPD on 2 L nasal cannula presents with increasing cough and shortness of breath.  Patient reports over the past several days has had increasing cough without hemoptysis and chest tightness with coughing.  He reports wheezing and shortness of breath.  He has not improved at home. He is a non-smoker  Patient arrives via EMS, he received DuoNebs, Solu-Medrol  and magnesium  prior to arrival Past Medical History:  Diagnosis Date   Acute pulmonary embolus ([name]) 02/24/2016   Dx December 2017 taking Eliquis  5mg  BID   Allergy 09/30/2021   Bronchiectasis with acute exacerbation ([name])    Bronchiectasis without acute exacerbation ([name]) 04/22/2021   Depression    stress and depression for any man is common (03/21/2014)   Diabetes mellitus without complication ([name])    DVT (deep venous thrombosis) ([name]) 11/03/2020   Dyspnea    Genital warts 01/04/2017   Hepatitis    I don't know what hepatitis I have   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  Authorizing Provider  acetaminophen  (TYLENOL ) 500 MG tablet Take 500 mg by mouth 2 (two) times daily as needed for fever or headache (pain).    [provider]  albuterol  (VENTOLIN  HFA) 108 (90 Base) MCG/ACT inhaler Inhale 2 puffs into the lungs every 6 (six) hours as needed for wheezing or shortness of breath. 12/21/23   Tobie Gaines, DO  benzonatate  (TESSALON ) 200 MG capsule Take 1 capsule (200 mg total) by mouth 3 (three) times daily as needed for cough. 12/16/23   Norrine Sharper, MD  Darunavir -Cobicistat -Emtricitabine -Tenofovir  Alafenamide (SYMTUZA ) 800-150-200-10 MG TABS Take 1 tablet by mouth daily with breakfast. 12/16/23 12/10/24  Luiz Channel, MD  dolutegravir  (TIVICAY ) 50 MG tablet Take 1 tablet (50 mg total) by mouth daily. 12/16/23 12/10/24  Luiz Channel, MD  insulin  glargine (LANTUS ) 100 UNIT/ML Solostar Pen Inject 25 Units into the skin at bedtime. 12/16/23   Norrine Sharper, MD  Insulin  Pen Needle 32G X 4 MM MISC Use 4 times a day 08/20/23   Singh, Prashant K, MD  rosuvastatin  (CRESTOR ) 20 MG tablet Take 1 tablet (20 mg total) by mouth daily. Patient not taking: Reported on 12/13/2023 08/20/23 08/19/24  Singh, Prashant K, MD  sulfamethoxazole -trimethoprim  (BACTRIM  DS) 800-160 MG tablet Take 1 tablet by mouth daily. 12/16/23   Luiz Channel, MD    Allergies: Metformin  and related    Review of Systems  Unable to perform ROS: Acuity of condition    Updated Vital Signs BP (!) 142/93   Pulse (!) 106   Temp (!) 96.8 F (  36 C) (Temporal)   Resp (!) 25   Ht 1.727 m (5' 8)   Wt 87 kg   SpO2 96%   BMI 29.16 kg/m   Physical Exam CONSTITUTIONAL: Ill-appearing HEAD: Normocephalic/atraumatic EYES: EOMI/PERRL ENMT: Mucous membranes moist, no stridor, no drooling, no angioedema NECK: supple no meningeal signs CV: S1/S2 noted, no murmurs/rubs/gallops noted LUNGS: Coarse wheezing bilaterally, mild tachypnea, on 2 L nasal cannula ABDOMEN: soft, colostomy in place GU:no cva  tenderness NEURO: Pt is awake/alert/appropriate, moves all extremitiesx4.  No facial droop.   EXTREMITIES: pulses normal/equal, full ROM, no lower extremity edema SKIN: warm, color normal  (all labs ordered are listed, but only abnormal results are displayed) Labs Reviewed  CBC WITH DIFFERENTIAL/PLATELET - Abnormal; Notable for the following components:      Result Value   HCT 52.7 (*)    MCV 100.2 (*)    All other components within normal limits  I-STAT CHEM 8, ED - Abnormal; Notable for the following components:   BUN 25 (*)    Creatinine, Ser 1.50 (*)    Glucose, Bld 122 (*)    Calcium , Ion 1.12 (*)    TCO2 34 (*)    All other components within normal limits  RESP PANEL BY RT-PCR (RSV, FLU A&B, COVID)  RVPGX2  CULTURE, BLOOD (ROUTINE X 2)  CULTURE, BLOOD (ROUTINE X 2)  BRAIN NATRIURETIC PEPTIDE  COMPREHENSIVE METABOLIC PANEL WITH GFR  I-STAT CG4 LACTIC ACID, ED    EKG: EKG Interpretation Date/Time:  Sunday January 23 2024 01:18:50 EST Ventricular Rate:  106 PR Interval:  159 QRS Duration:  88 QT Interval:  332 QTC Calculation: 441 R Axis:   -86  Text Interpretation: Sinus tachycardia Left anterior fascicular block Consider anterior infarct No significant change since last tracing Confirmed by Midge Golas ([id number]) on 01/23/2024 1:22:45 AM  Radiology: [facility] Chest Port 1 View Result Date: 01/23/2024 EXAM: 1 VIEW(S) XRAY OF THE CHEST 01/23/2024 01:57:39 AM COMPARISON: 12/13/2023 CLINICAL HISTORY: Questionable sepsis - evaluate for abnormality FINDINGS: LUNGS AND PLEURA: Chronic scarring is noted in the left mid and lower lung as well as in the right apex similar to that seen on prior CT. Known emphysematous changes are not as well appreciated. No pleural effusion. HEART AND MEDIASTINUM: No acute abnormality of the cardiac and mediastinal silhouettes. BONES AND SOFT TISSUES: No bony abnormality is noted. IMPRESSION: 1. No acute cardiopulmonary process. 2. Chronic scarring in  the left mid and lower lung and right apex, unchanged. Electronically signed by: Oneil Devonshire MD 01/23/2024 02:01 AM EST RP Workstation: [id number]     .Critical Care  Performed by: Midge Golas, MD Authorized by: Midge Golas, MD   Critical care provider statement:    Critical care time (minutes):  80   Critical care start time:  01/23/2024 1:30 AM   Critical care end time:  01/23/2024 2:50 AM   Critical care time was exclusive of:  Separately billable procedures and treating other patients   Critical care was necessary to treat or prevent imminent or life-threatening deterioration of the following conditions:  Respiratory failure and cardiac failure   Critical care was time spent personally by me on the following activities:  Obtaining history from patient or surrogate, examination of patient, development of treatment plan with patient or surrogate, ordering and performing treatments and interventions, ordering and review of laboratory studies, ordering and review of radiographic studies, pulse oximetry, re-evaluation of patient's condition, review of old charts and evaluation of patient's response to treatment  I assumed direction of critical care for this patient from another provider in my specialty: no      Medications Ordered in the ED  albuterol  (PROVENTIL ) (2.5 MG/3ML) 0.083% nebulizer solution (10 mg/hr Nebulization Given 01/23/24 0140)  ipratropium-albuterol  ([name]) 0.5-2.5 (3) MG/3ML nebulizer solution 3 mL (3 mLs Nebulization Given 01/23/24 0310)  guaiFENesin  (ROBITUSSIN) 100 MG/5ML liquid 5 mL (5 mLs Oral Given 01/23/24 0309)    Clinical Course as of 01/23/24 0423  Sun Jan 23, 2024  0131 Patient with significant medical conditions including HIV, COPD, previous PE presents with increasing cough wheezing and shortness of breath Patient had some improvement with EMS treatment, though he was still tachypneic and wheezing.  Will give continuous albuterol  Due to his underlying  comorbidities, will expand workup to include a sepsis evaluation [DW]  0223 Creatinine(!): 1.50 Renal insufficiency [DW]  0408 Patient received multiple neb treatments and is still tachypneic, wheezing and currently on 5 L nasal cannula with a pulse ox in the low 90s.  He will need to be admitted [DW]    Clinical Course User Index [DW] Midge Golas, MD                                 Medical Decision Making Amount and/or Complexity of Data Reviewed Labs: ordered. Decision-making details documented in ED Course. Radiology: ordered.  Risk OTC drugs. Prescription drug management. Decision regarding hospitalization.   This patient presents to the ED for concern of shortness of breath, this involves an extensive number of treatment options, and is a complaint that carries with it a high risk of complications and morbidity.  The differential diagnosis includes but is not limited to Acute coronary syndrome, pneumonia, acute pulmonary edema, pneumothorax, acute anemia, pulmonary embolism COPD exacerbation   Comorbidities that complicate the patient evaluation: Patient's presentation is complicated by their history of advanced HIV, COPD  Social Determinants of Health: Patient's limited English proficiency  increases the complexity of managing their presentation  Additional history obtained: Records reviewed previous admission documents, Primary Care Documents, and infectious disease note  Lab Tests: I Ordered, and personally interpreted labs.  The pertinent results include: Renal insufficiency  Imaging Studies ordered: I ordered imaging studies including X-ray chest  I independently visualized and interpreted imaging which showed no acute finding I agree with the radiologist interpretation  Cardiac Monitoring: The patient was maintained on a cardiac monitor.  I personally viewed and interpreted the cardiac monitor which showed an underlying rhythm of:  sinus rhythm  Medicines  ordered and prescription drug management: I ordered medication including albuterol  for wheezing Reevaluation of the patient after these medicines showed that the patient    improved   Critical Interventions:   nebulized therapies  Consultations Obtained: I requested consultation with the admitting physician internal medicine resident, and discussed  findings as well as pertinent plan - they recommend: Will admit  Reevaluation: After the interventions noted above, I reevaluated the patient and found that they have :improved improved but still requires therapy  Complexity of problems addressed: Patient's presentation is most consistent with  acute presentation with potential threat to life or bodily function  Disposition: After consideration of the diagnostic results and the patient's response to treatment,  I feel that the patent would benefit from admission  .        Final diagnoses:  COPD exacerbation ([name])  Acute respiratory failure with hypoxia [name])    ED Discharge  Orders     None          Midge Golas, MD 01/23/24 [phone]

## 2024-01-23 NOTE — Plan of Care (Signed)
   Problem: Education: Goal: Knowledge of General Education information will improve Description: Including pain rating scale, medication(s)/side effects and non-pharmacologic comfort measures Outcome: Progressing   Problem: Clinical Measurements: Goal: Ability to maintain clinical measurements within normal limits will improve Outcome: Progressing   Problem: Nutrition: Goal: Adequate nutrition will be maintained Outcome: Progressing

## 2024-01-23 NOTE — ED Triage Notes (Signed)
 Pt arrive by [facility] from home with increase SOB recently dc from hospital after having PNA sent home with O2 [place] PRN. Pt repots having to use O2 more frequently. Per EMS SPO2 89% on RA placed on neb treatment with SPO2 98. 1 duo neb, 125 mg Soumedrol and 2 g Mg given by EMS pta. BP 142/87 HR105, 26 RR.

## 2024-01-24 ENCOUNTER — Other Ambulatory Visit (HOSPITAL_COMMUNITY): Payer: Self-pay

## 2024-01-24 DIAGNOSIS — J441 Chronic obstructive pulmonary disease with (acute) exacerbation: Secondary | ICD-10-CM | POA: Diagnosis not present

## 2024-01-24 DIAGNOSIS — B20 Human immunodeficiency virus [HIV] disease: Secondary | ICD-10-CM | POA: Diagnosis not present

## 2024-01-24 LAB — CBC
HCT: 44 % (ref 39.0–52.0)
Hemoglobin: 14.1 g/dL (ref 13.0–17.0)
MCH: 30.5 pg (ref 26.0–34.0)
MCHC: 32 g/dL (ref 30.0–36.0)
MCV: 95 fL (ref 80.0–100.0)
Platelets: 185 K/uL (ref 150–400)
RBC: 4.63 MIL/uL (ref 4.22–5.81)
RDW: 12.7 % (ref 11.5–15.5)
WBC: 9 K/uL (ref 4.0–10.5)
nRBC: 0 % (ref 0.0–0.2)

## 2024-01-24 LAB — BASIC METABOLIC PANEL WITH GFR
Anion gap: 10 (ref 5–15)
BUN: 24 mg/dL — ABNORMAL HIGH (ref 6–20)
CO2: 28 mmol/L (ref 22–32)
Calcium: 9.1 mg/dL (ref 8.9–10.3)
Chloride: 101 mmol/L (ref 98–111)
Creatinine, Ser: 1.5 mg/dL — ABNORMAL HIGH (ref 0.61–1.24)
GFR, Estimated: 56 mL/min — ABNORMAL LOW (ref >60)
Glucose, Bld: 140 mg/dL — ABNORMAL HIGH (ref 70–99)
Potassium: 5 mmol/L (ref 3.5–5.1)
Sodium: 139 mmol/L (ref 135–145)

## 2024-01-24 MED ORDER — INSULIN GLARGINE-YFGN 100 UNIT/ML ~~LOC~~ SOPN
15.0000 [IU] | PEN_INJECTOR | Freq: Every day | SUBCUTANEOUS | 0 refills | Status: AC
  Filled 2024-01-24: qty 3, 20d supply, fill #0

## 2024-01-24 MED ORDER — ALBUTEROL SULFATE HFA 108 (90 BASE) MCG/ACT IN AERS
2.0000 | INHALATION_SPRAY | Freq: Four times a day (QID) | RESPIRATORY_TRACT | 9 refills | Status: DC | PRN
  Filled 2024-01-24: qty 6.7, 25d supply, fill #0

## 2024-01-24 MED ORDER — DOXYCYCLINE HYCLATE 100 MG PO TABS
100.0000 mg | ORAL_TABLET | Freq: Two times a day (BID) | ORAL | 0 refills | Status: DC
  Filled 2024-01-24: qty 7, 4d supply, fill #0

## 2024-01-24 MED ORDER — PREDNISONE 20 MG PO TABS
40.0000 mg | ORAL_TABLET | Freq: Every day | ORAL | 0 refills | Status: DC
  Filled 2024-01-24: qty 2, 1d supply, fill #0

## 2024-01-24 MED ORDER — INSULIN PEN NEEDLE 32G X 4 MM MISC
0 refills | Status: AC
  Filled 2024-01-24: qty 100, 30d supply, fill #0

## 2024-01-24 MED ORDER — BREZTRI AEROSPHERE 160-9-4.8 MCG/ACT IN AERO
2.0000 | INHALATION_SPRAY | Freq: Two times a day (BID) | RESPIRATORY_TRACT | 3 refills | Status: AC
  Filled 2024-01-24: qty 10.7, 30d supply, fill #0

## 2024-01-24 MED ORDER — CALCIUM CARBONATE ANTACID 500 MG PO CHEW
1.0000 | CHEWABLE_TABLET | Freq: Two times a day (BID) | ORAL | Status: DC | PRN
  Administered 2024-01-24: 200 mg via ORAL
  Filled 2024-01-24: qty 1

## 2024-01-24 MED ORDER — HYDROCOD POLI-CHLORPHE POLI ER 10-8 MG/5ML PO SUER
5.0000 mL | Freq: Every evening | ORAL | 0 refills | Status: AC | PRN
  Filled 2024-01-24: qty 70, 14d supply, fill #0

## 2024-01-24 NOTE — Plan of Care (Signed)
  Problem: Activity: Goal: Risk for activity intolerance will decrease Outcome: Progressing   Problem: Safety: Goal: Ability to remain free from injury will improve Outcome: Progressing   Problem: Pain Managment: Goal: General experience of comfort will improve and/or be controlled Outcome: Progressing

## 2024-01-24 NOTE — TOC CM/SW Note (Addendum)
 Transition of Care [facility]) - Inpatient Brief Assessment   Patient Details  Name: [name] MRN: [id number] Date of Birth: 09/16/1970  Transition of Care [facility]) CM/SW Contact:    Sudie Erminio Deems, RN Phone Number: 01/24/2024, 1:15 PM   Clinical Narrative: Inpatient Case Manager received a secure chat regarding discharge for this patient and DME oxygen needs. ICM spoke with the [employer] RN to see if she has any updates on the patient. [facility] RN spoke with patient and he is from home alone and has an oxygen tank (patient unsure of agency). ICM did call several agencies and the patient is active with [employer] just only for POC @ 2 Liters. Patient does not have any portable tanks. ICM did confirm orders for 3 Liters and portable tanks will be delivered to the room. [employer] will need to arrange a time for home concentrator to be set up today as well. [employer] is aware of the language that the patient speaks. Patient will have the [employer] RN following him in the community. No further home needs identified. Patient will have cab for transportation home via the dc lounge.    1651 01-24-24 Account is still on hold. Patient was not willing to provide [employer] with a credit card. [employer] is going to speak with the patient via interpreter to see if he is willing to place a card on file for billing.  Transition of Care Asessment: Insurance and Status: Insurance coverage has been reviewed Patient has primary care physician: Yes (Internal Medicine Clinic to follow up) Home environment has been reviewed: Patient lives alone   Prior/Current Home Services: No current home services Social Drivers of Health Review: SDOH reviewed no interventions necessary Readmission risk has been reviewed: Yes Transition of care needs: no transition of care needs at this time

## 2024-01-24 NOTE — Discharge Instructions (Addendum)
 Thank you for allowing us  to be part of your care. You were hospitalized for respiratory failure. We treated you with doxycycline , duoneb, prednisone , and breztri .  Changes in Medications and Management of Chronic Conditions  See the changes in your medications and management of your chronic conditions below:  For your breathing difficulty  - We encourage you to continue using your yellow Breztri  inhaler twice a day.  - We encourage you to continue using your red albuterol  inhaler up to three times a day.  - We have added Tussionex cough syrup to use at night to help with your cough.  - Take 2 tablets of Prednisone  every morning for the next 2 days.  - Take 1 tablet of Doxycycline  every 12 hours until you run out (in 4 days from discharge).  - We also decreased your insulin  down to 15 units which you should take every day.  Please see your primary care doctor in 7 to 10 days.  We have an appointment scheduled for you to see your primary care doctor on   Monday 01/31/24 at 10:45 am with [name] and Assistance Please call your Primary Care Provider (PCP) or our clinic if you have any questions or concerns. We may be able to help and keep you from a long and expensive emergency room wait. Our clinic and after-hours phone number is [phone].  The best time to call is Monday through Friday 9 am to 4 pm. There is always someone available 24/7 if you have an emergency. If you need medication refills, please notify your pharmacy one week in advance and they will send us  a request.  We are glad you are feeling better, [name] Internal Medicine Inpatient Teaching Service at [facility] [place]  =====  [name]. [name] , duoneb, prednisone , na breztri .  [name]  Kwa matatizo yako ya kupumua Tunakuhimiza uendelee kutumia kipulizi chako cha Breztri  cha rangi ya njano mara mbili kwa siku.  Tunakuhimiza uendelee kutumia kipulizi chako cha albuterol  cha rangi ya nyekundu hadi mara tatu kwa siku.  Tumeongeza dawa ya kikohozi ya maji (syrup) ya Tussionex utakayotumia usiku kusaidia na kikohozi chako.  Meza vidonge 2 vya Prednisone  kila asubuhi kwa siku 2 zijazo.  Meza kibonge 1 cha Doxycycline  kila baada ya masaa 12 hadi utakapoishiwa (katika siku 4 kuanzia ulipotoka hospitalini).  Pia, tumepunguza kiasi cha insulini yako hadi vitengo 15 (15 units), ambayo unapaswa kujidunga kila siku.  Miadi [name] (Follow-Up Appointment) [name] daktari wako mkuu (primary care doctor) baada ya siku 7 hadi 10.  Tumekupangia miadi ya kumuona daktari wako mkuu siku ya Jumatatu, 01/31/24 saa 10:45 asubuhi na [name].  Mawasiliano na Usaidizi Tafadhali mpigie simu Daktari wako Mkuu (PCP) au kliniki yetu ikiwa una maswali au wasiwasi wowote. [name] na [name] kusubiri kwa muda mrefu na gharama kubwa kwenye chumba cha dharura.  [name] ya simu ya kliniki yetu na ya baada ya saa [name] ni [phone].  Muda mzuri wa kupiga simu ni Jumat[place] hadi [name], saa 9 asubuhi hadi saa 4 alasiri.  [name] [name] mtu anayepatikana masaa 24/7 ikiwa una dharura.  Ikiwa unahitaji kujazwa tena kwa dawa (medication refills), tafadhali mjulishe mfamasia wako wiki moja kabla na watatutumia ombi.  Tunafurahi sudie  unajisikia vizuri,  [name] Huduma ya Ufundishaji ya [name] [facility] [place] (Inpatient Teaching Service) ya [name] [name] (Internal Medicine) [name]

## 2024-01-24 NOTE — Progress Notes (Signed)
 Mobility Specialist Progress Note:  Nurse requested Mobility Specialist to perform oxygen saturation test with pt which includes removing pt from oxygen both at rest and while ambulating.  Below are the results from that testing.     Patient Saturations on Room Air at Rest = spO2 82%. O2 increased to 3L/min to keep SPO2 above 88%.  Patient Saturations on Room Air while Ambulating = sp02 N/A% .    Patient Saturations on  3 Liters of oxygen while Ambulating = sp02 93%  At end of testing pt left in room on 3  Liters of oxygen.  Reported results to nurse.    Thersia Minder Mobility Specialist  Please contact vis Secure Chat or  Rehab Office [phone]

## 2024-01-24 NOTE — Progress Notes (Signed)
 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 Admitted for acute on chronic hypoxic respiratory failure.  Interim History:  Phone interpreter was present throughout the appointment.   11/10, pt reported that his breathing had improved, and that he would feel comfortable to go home. Denies fever, chest pain, N/V/D. Says that his cough makes his symptoms worse, and his cough is not productive. Had all questions answered.  OBJECTIVE:  Vital Signs: Vitals:   01/24/24 0335 01/24/24 0742 01/24/24 0831 01/24/24 1455  BP: 137/88 134/87  134/82  Pulse: 86 87  89  Resp: 18 16  17   Temp: 98.2 F (36.8 C) 98 F (36.7 C)  98.4 F (36.9 C)  TempSrc: Axillary Oral  Oral  SpO2: 92% 95% 95% 92%  Weight:      Height:       SpO2: 92 % O2 Flow Rate (L/min): 3 L/min  Filed Weights   01/23/24 0114  Weight: 87 kg     Intake/Output Summary (Last 24 hours) at 01/24/2024 1524 Last data filed at 01/24/2024 1330 Gross per 24 hour  Intake 240 ml  Output 1100 ml  Net -860 ml   Net IO Since Admission: -760 mL [01/24/24 1524]  Physical Exam: Physical Exam Vitals and nursing note reviewed. Exam conducted with a chaperone present.  Constitutional:      General: He is not in acute distress.    Appearance: He is not toxic-appearing.  Cardiovascular:     Rate and Rhythm: Normal rate and regular rhythm.     Pulses: Normal pulses.     Heart sounds: No murmur heard. Pulmonary:     Effort: Pulmonary effort is normal. No respiratory distress.     Breath sounds: Normal breath sounds. No wheezing.     Comments: On 3L [place] Abdominal:     General: There is no distension.     Palpations: Abdomen is soft.     Tenderness: There is no abdominal tenderness.  Musculoskeletal:      Right lower leg: No edema.     Left lower leg: No edema.  Skin:    General: Skin is warm and dry.  Neurological:     Mental Status: He is alert.  Patient Lines/Drains/Airways Status     Active Line/Drains/Airways     Name Placement date Placement time Site Days   Peripheral IV 01/23/24 20 G Left Forearm 01/23/24  0102  Forearm  less than 1   Peripheral IV 01/23/24 20 G Right Antecubital 01/23/24  0237  Antecubital  less than 1   Colostomy LLQ --  --  LLQ  --           Pertinent labs and imaging:      Latest Ref Rng & Units 01/24/2024    6:28 AM 01/23/2024    1:42 AM 01/23/2024    1:34 AM  CBC  WBC 4.0 - 10.5 K/uL 9.0   5.3   Hemoglobin 13.0 - 17.0 g/dL 85.8  82.9  83.6   Hematocrit 39.0 - 52.0 % 44.0  50.0  52.7   Platelets 150 - 400 K/uL 185   187        Latest Ref Rng & Units 01/23/2024    1:42 AM 12/16/2023    5:32 AM 12/15/2023  8:04 AM  CMP  Glucose 70 - 99 mg/dL 877  79  887   BUN 6 - 20 mg/dL 25  28  27    Creatinine 0.61 - 1.24 mg/dL 8.49  8.62  8.68   Sodium 135 - 145 mmol/L 140  139  138   Potassium 3.5 - 5.1 mmol/L 4.2  4.2  4.4   Chloride 98 - 111 mmol/L 98  99  97   CO2 22 - 32 mmol/L  30  30   Calcium  8.9 - 10.3 mg/dL  9.2  9.2     No results found.   ASSESSMENT/PLAN:  Assessment: Active Problems:   Acute on chronic hypoxic respiratory failure ([name]) [name] is a 53 y.o. 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   Plan: #Acute on chronic hypoxic respiratory failure #COPD Home O2 requirement of 2L at night, currently requiring 4-5L [place]. Presented with worsening cough for 2 days. Has had documented emphysematous changes of the lungs but no PFTs to confirm diagnosis of COPD. Currently  only taking Albuterol  PRN. CT scans in the past have also been concerning for interstitial lung disease. Physical exam with wheezing despite previous albuterol  treatments, along with rhonchi. Chest x-ray with chronic changes at the left lung base. Due to tachycardia and hypothermia, BC obtained. Patient has reportedly been taking Bactrim . Differential includes COPD exacerbation, ILD exacerbation, CAP, and PJP pneumonia. Favor COPD exacerbation in the setting of his wheezing and reportedly running out of his albuterol  inhaler. CAP seems less likely given his chest x-ray. We will treat for presumptive COPD exacerbation. He does have some edema on physical exam but his BNP is 14, making heart failure less likely. - Prednisone  40mg  daily, final dose 11/12 - Initiate triple therapy with Breztri  - Duonebs q4 as needed - continue PJP prophylaxis with Bactrim  800-160mg  daily - O2 target 88-92%, wean O2 as tolerated - Will require PFTs in the outpatient setting, especially given mixed picture on CT scans. - F/U BC - Started Tessalon  Perles PRN TID for cough.   #CKD 3a #AKI Unclear baseline, as the patient was discharged in October at Creatinine of 1.37 but had been at 0.98 in July of this year. Creatinine of 1.50 this morning. On exam, patient does have peripheral edema so we will hold off on fluid challenge for now and trend creatinine. Possible that his baseline has shifted. - AM CBC, BMP   #HIV with AIDS Last CD4 count 100 in September. Home regimen of Symtuza  and Tivicay . Will defer repeating CD4 count for now as it is unlikely to change management, as he is already taking PJP prophylaxis. - Continue Symtuza  - Continue Tivicay  - Continue Bactrim  800-160mg  daily   #Type 2 DM with HLD Last A1C 8.5 in July of 2025. Has been on a regimen of 25 units Lantus  daily at nighttime. Glucose 122 on admission.   - Glargine 15 units starting this evening given unclear PO intake, could potentially increase since  he is being given steroids as above - Restarted Rosuvastatin  20mg  daily, patient had not been taking in the outpatient setting   #Adenopathy Hilar lymphadenopathy on cross-sectional chest imaging on previous admission thought to be related to acute infection. Follow-up imaging recommended after clinical improvement.   #Hypercoagulable state  History of PE in 2017 and age-indeterminate clot in left popliteal vein discovered during June 2025 hospitalization. Eliquis  recommended at that time but he never started this. Patient declined long-term full dose anticoagulation at previous admission   Best Practice: Diet: Normal VTE: enoxaparin  (LOVENOX ) injection 40 mg Start: 01/23/24 1600 Code: Full  Disposition planning: DISPO: Anticipated discharge today/tomorrow to Home pending supplies acquisition. Pt is medically clear for discharge.  Signature:  Penne Lera [facility]  3:24 PM, 01/24/2024  On Call pager [phone]

## 2024-01-24 NOTE — Discharge Summary (Signed)
 Name: [name] MRN: [id number] DOB: September 19, 1970 53 y.o. PCP: Tobie Gaines, DO  Date of Admission: 01/23/2024  1:06 AM Date of Discharge: 01/24/2024 Attending Physician: Dr. Reyes Fenton  Discharge Diagnosis: 1. Active Problems:   Acute on chronic hypoxic respiratory failure [facility])   Discharge Medications: Allergies as of 01/24/2024       Reactions   Metformin  And Related Rash        Medication List     STOP taking these medications    benzonatate  200 MG capsule Commonly known as: TESSALON        TAKE these medications    albuterol  108 (90 Base) MCG/ACT inhaler Commonly known as: VENTOLIN  HFA Inhale 2 puffs into the lungs every 6 (six) hours as needed for wheezing or shortness of breath.   Breztri  Aerosphere 160-9-4.8 MCG/ACT Aero inhaler Generic drug: budesonide -glycopyrrolate -formoterol  Inhale 2 puffs into the lungs 2 (two) times daily.   chlorpheniramine-HYDROcodone  10-8 MG/5ML Commonly known as: TUSSIONEX Take 5 mLs by mouth at bedtime as needed for cough.   doxycycline  100 MG tablet Commonly known as: VIBRA -TABS Take 1 tablet (100 mg total) by mouth every 12 (twelve) hours.   insulin  glargine 100 UNIT/ML Solostar Pen Commonly known as: LANTUS  Inject 15 Units into the skin at bedtime. What changed: how much to take   predniSONE  20 MG tablet Commonly known as: DELTASONE  Take 2 tablets (40 mg total) by mouth daily with breakfast. Start taking on: January 25, 2024   rosuvastatin  20 MG tablet Commonly known as: Crestor  Take 1 tablet (20 mg total) by mouth daily.   sulfamethoxazole -trimethoprim  800-160 MG tablet Commonly known as: BACTRIM  DS Take 1 tablet by mouth daily.   Symtuza  800-150-200-10 MG Tabs Generic drug: Darunavir -Cobicistat -Emtricitabine -Tenofovir  Alafenamide Take 1 tablet by mouth daily with breakfast.   TechLite Plus Pen Needles 32G X 4 MM Misc Generic drug: Insulin  Pen Needle Use 4 times a day   Tivicay  50 MG  tablet Generic drug: dolutegravir  Take 1 tablet (50 mg total) by mouth daily.               Durable Medical Equipment  (From admission, onward)           Start     Ordered   01/24/24 1319  For home use only DME oxygen  Once       Question Answer Comment  Length of Need Lifetime   Liters per Minute 3   Oxygen delivery system: Gas   Oxygen delivery system: Portable concentrator (POC)   Oxygen delivery system: Concentrator      01/24/24 1318   01/24/24 1254  For home use only DME oxygen  Once       Question Answer Comment  Length of Need Lifetime   Mode or (Route) Nasal cannula   Liters per Minute 3   Frequency Continuous (stationary and portable oxygen unit needed)   Oxygen delivery system: Gas      01/24/24 1254            Disposition and follow-up:   Mr.[name] P Cancio was discharged from [facility] in Stable condition.  At the hospital follow up visit please address:  1.  Navigate with patient reason for running out of inhaler medications early and assess for barriers to receiving refills. 2. Try to wean pt down from 3L to 2L O2 if possible. 3. Continue with management of other chronic health conditions.  Follow-up Appointments:  Monday 01/31/24 at 10:45 am with Dr. 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 Admitted for acute on chronic hypoxic respiratory failure now being discharged on hospital day 0 with the following pertinent hospital course:  Acute on Chronic Hypoxic Respiratory Failure Pt was brought to the emergency department for worsening shortness of breath. He is on 2L at home as a baseline, and likely has COPD, but has not had complete diagnostic evaluation for this. His shortness  of breath worsened after he had run out of his inhaler medications (Albuterol  and Breztri ). He was started on Prednisone , Doxycycline  (in place of Azithromycin ), duoneb, and continued breztri . He made excellent progress and was able stable on 3L of O2 at time of discharge. He would occasionally have coughing fits that exacerbated his SOB, and tessalon  Perles were not beneficial. Being discharged with Tussinex.  Stable chronic medical conditions that did not have medication adjustments: HIV/AIDS (CD4 count 100 9/29) HFimpEF Hx of PE/DVT (declining AC)  Subjective Phone interpreter was present throughout the appointment.  On day of discharge, pt reported that his breathing had improved, and that he would feel comfortable to go home. Denies fever, chest pain, N/V/D. Says that his cough makes his symptoms worse, and his cough is not productive. Had all questions answered.  Discharge Exam:   BP 134/87 (BP Location: Right Arm)   Pulse 87   Temp 98 F (36.7 C) (Oral)   Resp 16   Ht 5' 8 (1.727 m)   Wt 87 kg   SpO2 95%   BMI 29.16 kg/m   Physical Exam Vitals and nursing note reviewed. Exam conducted with a chaperone present.  Constitutional:      General: He is not in acute distress.    Appearance: He is not toxic-appearing.  Cardiovascular:     Rate and Rhythm: Normal rate and regular rhythm.     Pulses: Normal pulses.     Heart sounds: No murmur heard. Pulmonary:     Effort: Pulmonary effort is normal. No respiratory distress.     Breath sounds: Normal breath sounds. No wheezing.     Comments: On 3L [place] Abdominal:     General: There is no distension.     Palpations: Abdomen is soft.     Tenderness: There is no abdominal tenderness.  Musculoskeletal:     Right lower leg: No edema.     Left lower leg: No edema.  Skin:    General: Skin is warm and dry.  Neurological:     Mental Status: He is alert.      Pertinent Labs, Studies, and Procedures:     Latest Ref Rng & Units  01/24/2024    6:28 AM 01/23/2024    1:42 AM 01/23/2024    1:34 AM  CBC  WBC 4.0 - 10.5 K/uL 9.0   5.3   Hemoglobin 13.0 - 17.0 g/dL 85.8  82.9  83.6   Hematocrit 39.0 - 52.0 % 44.0  50.0  52.7   Platelets 150 - 400 K/uL 185   187        Latest Ref Rng & Units 01/23/2024    1:42 AM 12/16/2023    5:32 AM 12/15/2023    8:04 AM  CMP  Glucose 70 - 99 mg/dL 877  79  887   BUN 6 - 20  mg/dL 25  28  27    Creatinine 0.61 - 1.24 mg/dL 8.49  8.62  8.68   Sodium 135 - 145 mmol/L 140  139  138   Potassium 3.5 - 5.1 mmol/L 4.2  4.2  4.4   Chloride 98 - 111 mmol/L 98  99  97   CO2 22 - 32 mmol/L  30  30   Calcium  8.9 - 10.3 mg/dL  9.2  9.2     DG Chest Port 1 View Result Date: 01/23/2024 EXAM: 1 VIEW(S) XRAY OF THE CHEST 01/23/2024 01:57:39 AM COMPARISON: 12/13/2023 CLINICAL HISTORY: Questionable sepsis - evaluate for abnormality FINDINGS: LUNGS AND PLEURA: Chronic scarring is noted in the left mid and lower lung as well as in the right apex similar to that seen on prior CT. Known emphysematous changes are not as well appreciated. No pleural effusion. HEART AND MEDIASTINUM: No acute abnormality of the cardiac and mediastinal silhouettes. BONES AND SOFT TISSUES: No bony abnormality is noted. IMPRESSION: 1. No acute cardiopulmonary process. 2. Chronic scarring in the left mid and lower lung and right apex, unchanged. Electronically signed by: Oneil Devonshire MD 01/23/2024 02:01 AM EST RP Workstation: [id number]    SignedBETHA Lera Nancyann KATHEE, DO 01/24/2024, 1:53 PM

## 2024-01-24 NOTE — TOC CM/SW Note (Signed)
    Durable Medical Equipment  (From admission, onward)           Start     Ordered   01/24/24 1254  For home use only DME oxygen  Once       Question Answer Comment  Length of Need Lifetime   Mode or (Route) Nasal cannula   Liters per Minute 3   Frequency Continuous (stationary and portable oxygen unit needed)   Oxygen delivery system: Gas      01/24/24 1254

## 2024-01-25 ENCOUNTER — Other Ambulatory Visit (HOSPITAL_COMMUNITY): Payer: Self-pay

## 2024-01-25 DIAGNOSIS — B20 Human immunodeficiency virus [HIV] disease: Secondary | ICD-10-CM | POA: Diagnosis not present

## 2024-01-25 DIAGNOSIS — J441 Chronic obstructive pulmonary disease with (acute) exacerbation: Secondary | ICD-10-CM | POA: Diagnosis not present

## 2024-01-25 LAB — HIV-1 RNA QUANT-NO REFLEX-BLD
HIV 1 RNA Quant: 100 {copies}/mL
LOG10 HIV-1 RNA: 2 {Log_copies}/mL

## 2024-01-25 MED ORDER — DOXYCYCLINE HYCLATE 100 MG PO TABS
100.0000 mg | ORAL_TABLET | Freq: Two times a day (BID) | ORAL | 0 refills | Status: AC
  Filled 2024-01-25: qty 5, 3d supply, fill #0

## 2024-01-25 NOTE — Plan of Care (Signed)
  Problem: Education: Goal: Knowledge of General Education information will improve Description: Including pain rating scale, medication(s)/side effects and non-pharmacologic comfort measures 01/25/2024 1219 by German Adrien BRAVO, RN Outcome: Completed/Met 01/25/2024 1103 by German Adrien BRAVO, RN Outcome: Progressing   Problem: Health Behavior/Discharge Planning: Goal: Ability to manage health-related needs will improve 01/25/2024 1219 by German Adrien BRAVO, RN Outcome: Completed/Met 01/25/2024 1103 by German Adrien BRAVO, RN Outcome: Progressing   Problem: Clinical Measurements: Goal: Ability to maintain clinical measurements within normal limits will improve 01/25/2024 1219 by German Adrien BRAVO, RN Outcome: Completed/Met 01/25/2024 1103 by German Adrien BRAVO, RN Outcome: Progressing Goal: Will remain free from infection 01/25/2024 1219 by German Adrien BRAVO, RN Outcome: Completed/Met 01/25/2024 1103 by German Adrien BRAVO, RN Outcome: Progressing Goal: Diagnostic test results will improve 01/25/2024 1219 by German Adrien BRAVO, RN Outcome: Completed/Met 01/25/2024 1103 by German Adrien BRAVO, RN Outcome: Progressing Goal: Respiratory complications will improve 01/25/2024 1219 by German Adrien BRAVO, RN Outcome: Completed/Met 01/25/2024 1103 by German Adrien BRAVO, RN Outcome: Progressing Goal: Cardiovascular complication will be avoided 01/25/2024 1219 by German Adrien BRAVO, RN Outcome: Completed/Met 01/25/2024 1103 by German Adrien BRAVO, RN Outcome: Progressing   Problem: Activity: Goal: Risk for activity intolerance will decrease 01/25/2024 1219 by German Adrien BRAVO, RN Outcome: Completed/Met 01/25/2024 1103 by German Adrien BRAVO, RN Outcome: Progressing   Problem: Nutrition: Goal: Adequate nutrition will be maintained 01/25/2024 1219 by German Adrien BRAVO, RN Outcome: Completed/Met 01/25/2024 1103 by German Adrien BRAVO, RN Outcome: Progressing   Problem: Coping: Goal: Level of anxiety will decrease 01/25/2024  1219 by German Adrien BRAVO, RN Outcome: Completed/Met 01/25/2024 1103 by German Adrien BRAVO, RN Outcome: Progressing   Problem: Elimination: Goal: Will not experience complications related to bowel motility 01/25/2024 1219 by German Adrien BRAVO, RN Outcome: Completed/Met 01/25/2024 1103 by German Adrien BRAVO, RN Outcome: Progressing Goal: Will not experience complications related to urinary retention 01/25/2024 1219 by German Adrien BRAVO, RN Outcome: Completed/Met 01/25/2024 1103 by German Adrien BRAVO, RN Outcome: Progressing   Problem: Pain Managment: Goal: General experience of comfort will improve and/or be controlled 01/25/2024 1219 by German Adrien BRAVO, RN Outcome: Completed/Met 01/25/2024 1103 by German Adrien BRAVO, RN Outcome: Progressing   Problem: Safety: Goal: Ability to remain free from injury will improve 01/25/2024 1219 by German Adrien BRAVO, RN Outcome: Completed/Met 01/25/2024 1103 by German Adrien BRAVO, RN Outcome: Progressing   Problem: Skin Integrity: Goal: Risk for impaired skin integrity will decrease 01/25/2024 1219 by German Adrien BRAVO, RN Outcome: Completed/Met 01/25/2024 1103 by German Adrien BRAVO, RN Outcome: Progressing

## 2024-01-25 NOTE — Congregational Nurse Program (Signed)
  Dept: [phone]   Congregational Nurse Program Note  Date of Encounter: 01/25/2024  Past Medical History: Past Medical History:  Diagnosis Date   Acute pulmonary embolus ([name]) 02/24/2016   Dx December 2017 taking Eliquis  5mg  BID   Allergy 09/30/2021   Bronchiectasis with acute exacerbation ([name])    Bronchiectasis without acute exacerbation ([name]) 04/22/2021   Depression    stress and depression for any man is common (03/21/2014)   Diabetes mellitus without complication ([name])    DVT (deep venous thrombosis) ([name]) 11/03/2020   Dyspnea    Genital warts 01/04/2017   Hepatitis    I don't know what hepatitis I have   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 Yes    Chiropractor    Location Patient Served  [facility]    Encounter Setting Phone/Text/Email    Population Status Migrant/Refugee    [employer] or [place] Insurance    Insurance/Financial Assistance Referral N/A    Medication Have Medication Insecurities    Medical Provider Yes    Screening Referrals Made N/A    Medical Referrals Made N/A    Medical Appointment Completed ED    CNP Interventions Advocate/Support;Navigate Healthcare System;Case Management;Counsel;Educate    Screenings CN Performed N/A    ED Visit Averted N/A    Life-Saving Intervention Made N/A         Follow-up visit over the phone. Patient still inpatient but may  be discharged today. He reports that he is waiting for discharge medications and that he's feeling much better. He is able to go to the bathroom without oxygen.   I assisted case management yesterday with discharge DME needs. I will follow up as outpatient.   Naomie Diamante Truszkowski RN BSN PCCN  [facility] [phone]-cell [phone]-office

## 2024-01-25 NOTE — TOC Transition Note (Signed)
 Transition of Care [facility]) - Discharge Note   Patient Details  Name: [name] MRN: [id number] Date of Birth: May 24, 1970  [facility]) CM/SW Contact:  Rosalva Jon Bloch, RN Phone Number: 01/25/2024, 1:08 PM   Clinical Narrative:    Patient will DC to: home Anticipated DC date: 01/25/2024 Family notified: yes Transport by: car  Per MD patient ready for DC today. RN, patient, and patient's family notified of DC.  Credit card information secured with [employer]. OxygenTOC unit delivered to bedside for transportation to home. [employer] to f/u with pt once home and set up concentrator. Pt without RX MED concerns or transportation issues. Post hospital f/u noted on AVS.   RNCM will sign off for now as intervention is no longer needed. Please consult us  again if new needs arise.    Final next level of care: Home/Self Care     Patient Goals and CMS Choice            Discharge Placement                       Discharge Plan and Services Additional resources added to the After Visit Summary for                  DME Arranged: Oxygen (toc unit) DME Agency: [employer] Date DME Agency Contacted: 01/25/24 Time DME Agency Contacted: 1308 Representative spoke with at DME Agency: Darlyn            Social Drivers of Health (SDOH) Interventions SDOH Screenings   Food Insecurity: Food Insecurity Present (01/23/2024)  Housing: High Risk (01/23/2024)  Transportation Needs: No Transportation Needs (01/23/2024)  Utilities: Not At Risk (01/23/2024)  Depression (PHQ2-9): Low Risk  (06/09/2023)  Social Connections: Socially Integrated (08/15/2023)  Tobacco Use: Medium Risk (01/23/2024)     Readmission Risk Interventions    04/09/2023   12:37 PM 08/21/2022    3:13 PM 07/02/2022   12:34 PM  Readmission Risk Prevention Plan  Post Dischage Appt   Complete  Medication Screening   Complete  Transportation Screening Complete Complete Complete  PCP or  Specialist Appt within 5-7 Days Complete Complete   Home Care Screening Complete Complete   Medication Review (RN CM) Complete Complete

## 2024-01-25 NOTE — Plan of Care (Signed)

## 2024-01-25 NOTE — Discharge Summary (Addendum)
 Name: [name] MRN: [id number] DOB: 1970-11-30 53 y.o. PCP: Tobie Gaines, DO  Date of Admission: 01/23/2024  1:06 AM Date of Discharge: 01/25/2024 Attending Physician: Dr. Reyes Fenton  Discharge Diagnosis: 1. Active Problems:   Acute on chronic hypoxic respiratory failure [facility])   Discharge Medications: Allergies as of 01/25/2024       Reactions   Metformin  And Related Rash        Medication List     STOP taking these medications    benzonatate  200 MG capsule Commonly known as: TESSALON    insulin  glargine 100 UNIT/ML Solostar Pen Commonly known as: LANTUS  Replaced by: Semglee  (yfgn) 100 UNIT/ML Pen       TAKE these medications    albuterol  108 (90 Base) MCG/ACT inhaler Commonly known as: VENTOLIN  HFA Inhale 2 puffs into the lungs every 6 (six) hours as needed for wheezing or shortness of breath.   Breztri  Aerosphere 160-9-4.8 MCG/ACT Aero inhaler Generic drug: budesonide -glycopyrrolate -formoterol  Inhale 2 puffs into the lungs 2 (two) times daily.   chlorpheniramine-HYDROcodone  10-8 MG/5ML Commonly known as: TUSSIONEX Take 5 mLs by mouth at bedtime as needed for cough.   doxycycline  100 MG tablet Commonly known as: VIBRA -TABS Take 1 tablet (100 mg total) by mouth every 12 (twelve) hours.   rosuvastatin  20 MG tablet Commonly known as: Crestor  Take 1 tablet (20 mg total) by mouth daily.   Semglee  (yfgn) 100 UNIT/ML Pen Generic drug: insulin  glargine-yfgn Inject 15 Units into the skin at bedtime. Replaces: insulin  glargine 100 UNIT/ML Solostar Pen   sulfamethoxazole -trimethoprim  800-160 MG tablet Commonly known as: BACTRIM  DS Take 1 tablet by mouth daily.   Symtuza  800-150-200-10 MG Tabs Generic drug: Darunavir -Cobicistat -Emtricitabine -Tenofovir  Alafenamide Take 1 tablet by mouth daily with breakfast.   TechLite Plus Pen Needles 32G X 4 MM Misc Generic drug: Insulin  Pen Needle Use 4 times a day What changed: Another medication with  the same name was added. Make sure you understand how and when to take each.   Insulin  Pen Needle 32G X 4 MM Misc Use with Semglee  pen What changed: You were already taking a medication with the same name, and this prescription was added. Make sure you understand how and when to take each.   Tivicay  50 MG tablet Generic drug: dolutegravir  Take 1 tablet (50 mg total) by mouth daily.               Durable Medical Equipment  (From admission, onward)           Start     Ordered   01/24/24 1319  For home use only DME oxygen  Once       Question Answer Comment  Length of Need Lifetime   Liters per Minute 3   Oxygen delivery system: Gas   Oxygen delivery system: Portable concentrator (POC)   Oxygen delivery system: Concentrator      01/24/24 1318   01/24/24 1254  For home use only DME oxygen  Once       Question Answer Comment  Length of Need Lifetime   Mode or (Route) Nasal cannula   Liters per Minute 3   Frequency Continuous (stationary and portable oxygen unit needed)   Oxygen delivery system: Gas      01/24/24 1254            Disposition and follow-up:   [name] was discharged from [facility] in Stable condition.  At the hospital follow up visit please address:  1.  Navigate with patient reason for running out of inhaler medications early and assess for barriers to receiving refills. 2. Try to wean pt down from 3L to 2L O2 if possible. 3. Continue with management of other chronic health conditions.  Follow-up Appointments:  Monday 01/31/24 at 10:45 am with Dr. [facility] Hospital Course by problem list:  53 year old male living with HIV/AIDS (CD4 count 100 9/29), chronic hypoxic respiratory failure (2L home O2), COPD with bronchiectasis, HFimpEF, hx of PE/DVT not on AC due to patient declining, presenting with several days of dyspnea and cough. Admitted for acute on chronic hypoxic respiratory failure now being discharged on  hospital day 0 with the following pertinent hospital course:  Acute on Chronic Hypoxic Respiratory Failure Pt was brought to the emergency department for worsening shortness of breath. He is on 2L at home as a baseline, and likely has COPD, but has not had complete diagnostic evaluation for this. His shortness of breath worsened after he had run out of his inhaler medications (Albuterol  and Breztri ). He was started on Prednisone , Doxycycline  (in place of Azithromycin ), duoneb, and continued breztri . He made excellent progress and was able stable on 3L of O2 at time of discharge. He would occasionally have coughing fits that exacerbated his SOB, and tessalon  Perles were not beneficial. Being discharged with Tussinex.  Stable chronic medical conditions that did not have medication adjustments: HIV/AIDS (CD4 count 100 9/29) HFimpEF Hx of PE/DVT (declining AC)  Subjective Phone interpreter was present throughout the appointment.  On day of discharge, pt reported that his breathing had improved, and that he would feel comfortable to go home. We discussed that he needed to provide a credit card for him to obtain his oxygen supplies, to which he agreed. Denies fever, chest pain, N/V/D. Says that his cough makes his symptoms worse, and his cough is not productive. Had all questions answered.  Discharge Exam:   BP 128/86 (BP Location: Right Arm)   Pulse 76   Temp 98.8 F (37.1 C)   Resp 19   Ht 5' 8 (1.727 m)   Wt 81.6 kg   SpO2 97%   BMI 27.35 kg/m   Physical Exam Vitals and nursing note reviewed. Exam conducted with a chaperone present.  Constitutional:      General: He is not in acute distress.    Appearance: He is not toxic-appearing.  Cardiovascular:     Rate and Rhythm: Normal rate and regular rhythm.     Pulses: Normal pulses.     Heart sounds: No murmur heard. Pulmonary:     Effort: Pulmonary effort is normal. No respiratory distress.     Breath sounds: Normal breath sounds. No  wheezing.     Comments: On 3L [place] Abdominal:     General: There is no distension.     Palpations: Abdomen is soft.     Tenderness: There is no abdominal tenderness.  Musculoskeletal:     Right lower leg: No edema.     Left lower leg: No edema.  Skin:    General: Skin is warm and dry.  Neurological:     Mental Status: He is alert.      Pertinent Labs, Studies, and Procedures:     Latest Ref Rng & Units 01/24/2024    6:28 AM 01/23/2024    1:42 AM 01/23/2024    1:34 AM  CBC  WBC 4.0 - 10.5 K/uL 9.0   5.3   Hemoglobin 13.0 - 17.0 g/dL 85.8  17.0  16.3   Hematocrit 39.0 - 52.0 % 44.0  50.0  52.7   Platelets 150 - 400 K/uL 185   187        Latest Ref Rng & Units 01/24/2024    6:28 AM 01/23/2024    1:42 AM 12/16/2023    5:32 AM  CMP  Glucose 70 - 99 mg/dL 859  877  79   BUN 6 - 20 mg/dL 24  25  28    Creatinine 0.61 - 1.24 mg/dL 8.49  8.49  8.62   Sodium 135 - 145 mmol/L 139  140  139   Potassium 3.5 - 5.1 mmol/L 5.0  4.2  4.2   Chloride 98 - 111 mmol/L 101  98  99   CO2 22 - 32 mmol/L 28   30   Calcium  8.9 - 10.3 mg/dL 9.1   9.2     DG Chest Port 1 View Result Date: 01/23/2024 EXAM: 1 VIEW(S) XRAY OF THE CHEST 01/23/2024 01:57:39 AM COMPARISON: 12/13/2023 CLINICAL HISTORY: Questionable sepsis - evaluate for abnormality FINDINGS: LUNGS AND PLEURA: Chronic scarring is noted in the left mid and lower lung as well as in the right apex similar to that seen on prior CT. Known emphysematous changes are not as well appreciated. No pleural effusion. HEART AND MEDIASTINUM: No acute abnormality of the cardiac and mediastinal silhouettes. BONES AND SOFT TISSUES: No bony abnormality is noted. IMPRESSION: 1. No acute cardiopulmonary process. 2. Chronic scarring in the left mid and lower lung and right apex, unchanged. Electronically signed by: Oneil Devonshire MD 01/23/2024 02:01 AM EST RP Workstation: [id number]    SignedBETHA Lera Nancyann KATHEE, DO 01/25/2024, 10:32 AM

## 2024-01-28 LAB — CULTURE, BLOOD (ROUTINE X 2)
Culture: NO GROWTH
Culture: NO GROWTH
Special Requests: ADEQUATE

## 2024-01-28 LAB — ACID FAST CULTURE WITH REFLEXED SENSITIVITIES (MYCOBACTERIA): Acid Fast Culture: NEGATIVE

## 2024-01-29 LAB — ACID FAST CULTURE WITH REFLEXED SENSITIVITIES (MYCOBACTERIA): Acid Fast Culture: NEGATIVE

## 2024-01-31 ENCOUNTER — Ambulatory Visit

## 2024-02-18 ENCOUNTER — Telehealth: Payer: Self-pay

## 2024-02-18 NOTE — Telephone Encounter (Signed)
 Faxed signed oxygen order forms back to [facility].   Fiore Detjen, BSN, RN

## 2024-03-20 ENCOUNTER — Other Ambulatory Visit: Payer: Self-pay

## 2024-03-20 ENCOUNTER — Ambulatory Visit: Admitting: Internal Medicine

## 2024-03-20 ENCOUNTER — Ambulatory Visit

## 2024-03-20 ENCOUNTER — Encounter: Payer: Self-pay | Admitting: Internal Medicine

## 2024-03-20 VITALS — BP 129/81 | HR 97 | Temp 97.6°F | Wt 190.0 lb

## 2024-03-20 DIAGNOSIS — Z23 Encounter for immunization: Secondary | ICD-10-CM

## 2024-03-20 DIAGNOSIS — R21 Rash and other nonspecific skin eruption: Secondary | ICD-10-CM | POA: Diagnosis not present

## 2024-03-20 DIAGNOSIS — B20 Human immunodeficiency virus [HIV] disease: Secondary | ICD-10-CM | POA: Diagnosis not present

## 2024-03-20 DIAGNOSIS — Z299 Encounter for prophylactic measures, unspecified: Secondary | ICD-10-CM

## 2024-03-20 DIAGNOSIS — Z79899 Other long term (current) drug therapy: Secondary | ICD-10-CM

## 2024-03-20 DIAGNOSIS — Z7185 Encounter for immunization safety counseling: Secondary | ICD-10-CM

## 2024-03-20 DIAGNOSIS — L409 Psoriasis, unspecified: Secondary | ICD-10-CM

## 2024-03-20 DIAGNOSIS — J449 Chronic obstructive pulmonary disease, unspecified: Secondary | ICD-10-CM

## 2024-03-20 MED ORDER — HYDROXYZINE HCL 10 MG PO TABS: 10.0000 mg | ORAL_TABLET | Freq: Three times a day (TID) | ORAL | 3 refills | Status: AC | PRN

## 2024-03-20 MED ORDER — TRIAMCINOLONE ACETONIDE 0.025 % EX OINT: 1.0000 | TOPICAL_OINTMENT | Freq: Two times a day (BID) | CUTANEOUS | 0 refills | Status: AC

## 2024-03-20 NOTE — Progress Notes (Signed)
 "    RFV: follow up for HIV disease  Patient ID: [name], male   DOB: May 12, 1970, 54 y.o.   MRN: [id number]  HPI [name] is a 54yo M with HIV disease, on tivicay /symtuza , CD 4 count of 98/VL 100. He is here with translator. He reports that he is not having any issues with getting his medications from pharmacy. He is taking his meds daily. He states his rash still at times pruritic.he was treated for COPD exacerbation in November but otherwise doing okay. He states his respiratory status is doing better.  Outpatient Encounter Medications as of 03/20/2024  Medication Sig   albuterol  (VENTOLIN  HFA) 108 (90 Base) MCG/ACT inhaler Inhale 2 puffs into the lungs every 6 (six) hours as needed for wheezing or shortness of breath.   Darunavir -Cobicistat -Emtricitabine -Tenofovir  Alafenamide (SYMTUZA ) 800-150-200-10 MG TABS Take 1 tablet by mouth daily with breakfast.   dolutegravir  (TIVICAY ) 50 MG tablet Take 1 tablet (50 mg total) by mouth daily.   insulin  glargine-yfgn (SEMGLEE ) 100 UNIT/ML Pen Inject 15 Units into the skin at bedtime.   BREZTRI  AEROSPHERE 160-9-4.8 MCG/ACT AERO inhaler Inhale 2 puffs into the lungs 2 (two) times daily. (Patient not taking: Reported on 03/20/2024)   chlorpheniramine-HYDROcodone  (TUSSIONEX) 10-8 MG/5ML Take 5 mLs by mouth at bedtime as needed for cough. (Patient not taking: Reported on 03/20/2024)   doxycycline  (VIBRA -TABS) 100 MG tablet Take 1 tablet (100 mg total) by mouth every 12 (twelve) hours. (Patient not taking: Reported on 03/20/2024)   Insulin  Pen Needle 32G X 4 MM MISC Use 4 times a day   Insulin  Pen Needle 32G X 4 MM MISC Use with Semglee  pen   rosuvastatin  (CRESTOR ) 20 MG tablet Take 1 tablet (20 mg total) by mouth daily. (Patient not taking: Reported on 03/20/2024)   sulfamethoxazole -trimethoprim  (BACTRIM  DS) 800-160 MG tablet Take 1 tablet by mouth daily. (Patient not taking: Reported on 03/20/2024)   [DISCONTINUED] acetaminophen  (TYLENOL ) 500 MG tablet Take 500  mg by mouth 2 (two) times daily as needed for fever or headache (pain).   [DISCONTINUED] albuterol  (VENTOLIN  HFA) 108 (90 Base) MCG/ACT inhaler Inhale 2 puffs into the lungs every 6 (six) hours as needed for wheezing or shortness of breath.   [DISCONTINUED] apixaban  (ELIQUIS ) 2.5 MG TABS tablet Take 2 tablets (5 mg total) by mouth 2 (two) times daily for 7 days, THEN 1 tablet (2.5 mg total) 2 (two) times daily. (Patient not taking: Reported on 12/13/2023)   [DISCONTINUED] Darunavir -Cobicistat -Emtricitabine -Tenofovir  Alafenamide (SYMTUZA ) 800-150-200-10 MG TABS Take 1 tablet by mouth daily with breakfast.   [DISCONTINUED] dolutegravir  (TIVICAY ) 50 MG tablet Take 1 tablet (50 mg total) by mouth daily.   [DISCONTINUED] insulin  glargine (LANTUS ) 100 UNIT/ML Solostar Pen Inject 36 Units into the skin at bedtime.Ingiza vitengo 36 wakati wa kulala (Patient taking differently: Inject 0-10 Units into the skin See admin instructions. Inject 0-10 units into the skin three times daily before meals: BS<139 : 0 units 140-199 : 2 units 200-250 : 4 units 251-300 : 6 units 301-350 : 8 units BS>350 : 10 units)   [DISCONTINUED] insulin  lispro (HUMALOG  KWIKPEN) 100 UNIT/ML KwikPen Inject into the skin before each meal 3 times a day using the following sliding scale, 140-199 - 2 units, 200-250 - 4 units, 251-299 - 6 units,  300-349 - 8 units,  350 or above 10 units. Ingiza ndani ya ngozi kabla ya kila mlo mara 3 kwa siku kwa kutumia mizani ifuatayo ya kuteleza, vitengo 140-199 - 2, vitengo 200-250 - 4,  vitengo 251-299 - 6,  300-349 - 8, 350 au zaidi ya vitengo 10. (Patient taking differently: Inject 0-10 Units into the skin See admin instructions. Inject 0-10 units into the skin three times daily before meals: BS<139 : 0 units 140-199 : 2 units 200-250 : 4 units 251-300 : 6 units 301-350 : 8 units BS>350 : 10 units Ingiza ndani ya ngozi kabla ya kila mlo mara 3 kwa siku kwa kutumia mizani ifuatayo ya kuteleza :   vitengo 140-199 : 2 vitengo 200-250 : 4 vitengo 251-299 : 6 vitengo 300-349 : 8 vitengo 350 au zaidi ya vitengo 10.)   [DISCONTINUED] isosorbide  mononitrate (IMDUR ) 30 MG 24 hr tablet Take 0.5 tablets (15 mg total) by mouth daily. (Patient not taking: Reported on 12/13/2023)   [DISCONTINUED] sulfamethoxazole -trimethoprim  (BACTRIM  DS) 800-160 MG tablet Take 1 tablet by mouth daily.   [EXPIRED] azithromycin  (ZITHROMAX ) tablet 500 mg    [DISCONTINUED] acetaminophen  (TYLENOL ) tablet 1,000 mg    [DISCONTINUED] albuterol  (PROVENTIL ) (2.5 MG/3ML) 0.083% nebulizer solution 3 mL    [DISCONTINUED] benzonatate  (TESSALON ) capsule 200 mg    [DISCONTINUED] cefTRIAXone  (ROCEPHIN ) 1 g in sodium chloride  0.9 % 100 mL IVPB    [DISCONTINUED] Darunavir -Cobicistat -Emtricitabine -Tenofovir  Alafenamide (SYMTUZA ) 800-150-200-10 MG TABS 1 tablet    [DISCONTINUED] dolutegravir  (TIVICAY ) tablet 50 mg    [DISCONTINUED] enoxaparin  (LOVENOX ) injection 40 mg    [DISCONTINUED] guaiFENesin  (MUCINEX ) 12 hr tablet 600 mg    [DISCONTINUED] insulin  aspart (novoLOG ) injection 0-15 Units    [DISCONTINUED] insulin  glargine (LANTUS ) injection 25 Units    [DISCONTINUED] ondansetron  (ZOFRAN ) injection 4 mg    [DISCONTINUED] sulfamethoxazole -trimethoprim  (BACTRIM  DS) 800-160 MG per tablet 1 tablet    No facility-administered encounter medications on file as of 03/20/2024.     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   COPD exacerbation ([name]) 06/04/2023   COPD (chronic obstructive pulmonary disease) ([name]) 04/09/2023   Pneumonia due to infectious organism 04/08/2023   Acute hypoxic respiratory failure  ([name]) 04/08/2023   CAP (community acquired pneumonia) 04/07/2023   Hypertension associated with diabetes ([name]) 03/22/2023   Health care maintenance 03/22/2023   Adenopathy 12/07/2021   Atopic dermatitis 09/30/2021   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   Bronchiectasis ([name]) 04/22/2021   S/P colostomy ([name]) 02/06/2021   Type 2 diabetes mellitus with hyperlipidemia ([name]) 05/17/2019   Genital warts 05/06/2018   Pulmonary emphysema ([name])    Condyloma 01/04/2017   Chronic systolic CHF (congestive heart failure) ([name]) 04/24/2016   AIDS ([name])    HIV disease ([name]) 09/18/2013     Health Maintenance Due  Topic Date Due   OPHTHALMOLOGY EXAM  Never done   Zoster Vaccines- Shingrix (1 of 2) Never done   Hepatitis B Vaccines 19-59 Average Risk (3 of 3 - 19+ 3-dose series) 04/11/2014   COVID-19 Vaccine (3 - Pfizer risk series) 03/15/2023     Review of Systems 12 point ros is otherwise negative Physical Exam   BP 129/81   Pulse 97   Temp 97.6 F (36.4 C) (Oral)   Wt 190 lb (86.2 kg)   SpO2 93%   BMI 28.89 kg/m   Physical Exam  Constitutional: He is oriented  to person, place, and time. He appears well-developed and well-nourished. No distress.  HENT:  Mouth/Throat: Oropharynx is clear and moist. No oropharyngeal exudate.  Cardiovascular: Normal rate, regular rhythm and normal heart sounds. Exam reveals no gallop and no friction rub.  No murmur heard.  Pulmonary/Chest: Effort normal and breath sounds normal. No respiratory distress. He has no wheezes.  Abdominal: Soft. Bowel sounds are normal. He exhibits no distension. There is no tenderness.  Lymphadenopathy:  He has no cervical adenopathy.  Neurological: He is alert and oriented to person, place, and time.  Skin: Skin is warm and dry. No rash noted. No erythema.  Psychiatric: He has a normal mood and affect. His behavior is normal.   Lab Results  Component Value Date   CD4TCELL 12 (L)  12/06/2023   Lab Results  Component Value Date   CD4TABS 98 (L) 12/06/2023   CD4TABS 100 (L) 09/30/2023   CD4TABS 58 (L) 08/16/2023   Lab Results  Component Value Date   HIV1RNAQUANT 100 01/24/2024   No results found for: HEPBSAB Lab Results  Component Value Date   LABRPR NON-REACTIVE 12/06/2023    CBC Lab Results  Component Value Date   WBC 9.0 01/24/2024   RBC 4.63 01/24/2024   HGB 14.1 01/24/2024   HCT 44.0 01/24/2024   PLT 185 01/24/2024   MCV 95.0 01/24/2024   MCH 30.5 01/24/2024   MCHC 32.0 01/24/2024   RDW 12.7 01/24/2024   LYMPHSABS 2.4 01/23/2024   MONOABS 0.7 01/23/2024   EOSABS 0.3 01/23/2024    BMET Lab Results  Component Value Date   NA 139 01/24/2024   K 5.0 01/24/2024   CL 101 01/24/2024   CO2 28 01/24/2024   GLUCOSE 140 (H) 01/24/2024   BUN 24 (H) 01/24/2024   CREATININE 1.50 (H) 01/24/2024   CALCIUM  9.1 01/24/2024   GFRNONAA 56 (L) 01/24/2024   GFRAA 79 12/04/2019      Assessment and Plan  Hiv = doing well with adherence. 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 We will check HIV VL and genotype.  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 Ref to derm  COPD = continue with current regimen.   "

## 2024-03-21 LAB — T-HELPER CELL (CD4) - (RCID CLINIC ONLY)
CD4 % Helper T Cell: 10 % — ABNORMAL LOW (ref 33–65)
CD4 T Cell Abs: 85 /uL — ABNORMAL LOW (ref 400–1790)

## 2024-03-22 LAB — COMPLETE METABOLIC PANEL WITHOUT GFR
AG Ratio: 1.2 (calc) (ref 1.0–2.5)
ALT: 17 U/L (ref 9–46)
AST: 22 U/L (ref 10–35)
Albumin: 4.2 g/dL (ref 3.6–5.1)
Alkaline phosphatase (APISO): 82 U/L (ref 35–144)
BUN: 13 mg/dL (ref 7–25)
CO2: 27 mmol/L (ref 20–32)
Calcium: 9.2 mg/dL (ref 8.6–10.3)
Chloride: 102 mmol/L (ref 98–110)
Creat: 1.11 mg/dL (ref 0.70–1.30)
Globulin: 3.5 g/dL (ref 1.9–3.7)
Glucose, Bld: 132 mg/dL — ABNORMAL HIGH (ref 65–99)
Potassium: 4 mmol/L (ref 3.5–5.3)
Sodium: 141 mmol/L (ref 135–146)
Total Bilirubin: 0.5 mg/dL (ref 0.2–1.2)
Total Protein: 7.7 g/dL (ref 6.1–8.1)

## 2024-03-22 LAB — CBC WITH DIFFERENTIAL/PLATELET
Absolute Lymphocytes: 1130 {cells}/uL (ref 850–3900)
Absolute Monocytes: 480 {cells}/uL (ref 200–950)
Basophils Absolute: 10 {cells}/uL (ref 0–200)
Basophils Relative: 0.2 %
Eosinophils Absolute: 630 {cells}/uL — ABNORMAL HIGH (ref 15–500)
Eosinophils Relative: 12.6 %
HCT: 50 % (ref 39.4–51.1)
Hemoglobin: 16 g/dL (ref 13.2–17.1)
MCH: 30.1 pg (ref 27.0–33.0)
MCHC: 32 g/dL (ref 31.6–35.4)
MCV: 94.2 fL (ref 81.4–101.7)
MPV: 10.7 fL (ref 7.5–12.5)
Monocytes Relative: 9.6 %
Neutro Abs: 2750 {cells}/uL (ref 1500–7800)
Neutrophils Relative %: 55 %
Platelets: 210 Thousand/uL (ref 140–400)
RBC: 5.31 Million/uL (ref 4.20–5.80)
RDW: 12.8 % (ref 11.0–15.0)
Total Lymphocyte: 22.6 %
WBC: 5 Thousand/uL (ref 3.8–10.8)

## 2024-03-22 LAB — HIV-1 RNA QUANT-NO REFLEX-BLD
HIV 1 RNA Quant: <20 {copies}/mL — AB
HIV-1 RNA Quant, Log: <1.3 {Log_copies}/mL — AB

## 2024-03-22 LAB — SYPHILIS: RPR W/REFLEX TO RPR TITER AND TREPONEMAL ANTIBODIES, TRADITIONAL SCREENING AND DIAGNOSIS ALGORITHM: RPR Ser Ql: NONREACTIVE

## 2024-03-31 ENCOUNTER — Other Ambulatory Visit: Payer: Self-pay

## 2024-03-31 DIAGNOSIS — J449 Chronic obstructive pulmonary disease, unspecified: Secondary | ICD-10-CM

## 2024-03-31 MED ORDER — ALBUTEROL SULFATE HFA 108 (90 BASE) MCG/ACT IN AERS: 2.0000 | INHALATION_SPRAY | Freq: Four times a day (QID) | RESPIRATORY_TRACT | 9 refills | Status: AC | PRN

## 2024-04-19 ENCOUNTER — Other Ambulatory Visit: Payer: Self-pay | Admitting: Student

## 2024-04-19 DIAGNOSIS — E1169 Type 2 diabetes mellitus with other specified complication: Secondary | ICD-10-CM

## 2024-06-26 ENCOUNTER — Ambulatory Visit: Payer: Self-pay | Admitting: Internal Medicine
